# Patient Record
Sex: Male | Born: 1935 | Race: White | Hispanic: No | Smoking: Former smoker
Health system: Southern US, Community
[De-identification: ages and names within clinical notes are randomized; demographics above are authoritative.]

## PROBLEM LIST (undated history)

## (undated) DIAGNOSIS — R0600 Dyspnea, unspecified: Secondary | ICD-10-CM

## (undated) DIAGNOSIS — R413 Other amnesia: Secondary | ICD-10-CM

## (undated) DIAGNOSIS — I493 Ventricular premature depolarization: Secondary | ICD-10-CM

## (undated) DIAGNOSIS — K220 Achalasia of cardia: Secondary | ICD-10-CM

## (undated) DIAGNOSIS — I499 Cardiac arrhythmia, unspecified: Secondary | ICD-10-CM

## (undated) DIAGNOSIS — I519 Heart disease, unspecified: Secondary | ICD-10-CM

## (undated) DIAGNOSIS — R0609 Other forms of dyspnea: Secondary | ICD-10-CM

## (undated) DIAGNOSIS — I2510 Atherosclerotic heart disease of native coronary artery without angina pectoris: Secondary | ICD-10-CM

## (undated) DIAGNOSIS — J449 Chronic obstructive pulmonary disease, unspecified: Secondary | ICD-10-CM

## (undated) DIAGNOSIS — C4490 Unspecified malignant neoplasm of skin, unspecified: Secondary | ICD-10-CM

## (undated) DIAGNOSIS — S060X9A Concussion with loss of consciousness of unspecified duration, initial encounter: Secondary | ICD-10-CM

## (undated) DIAGNOSIS — E785 Hyperlipidemia, unspecified: Secondary | ICD-10-CM

## (undated) DIAGNOSIS — Z95810 Presence of automatic (implantable) cardiac defibrillator: Secondary | ICD-10-CM

## (undated) DIAGNOSIS — S060XAA Concussion with loss of consciousness status unknown, initial encounter: Secondary | ICD-10-CM

## (undated) DIAGNOSIS — E119 Type 2 diabetes mellitus without complications: Secondary | ICD-10-CM

## (undated) DIAGNOSIS — R011 Cardiac murmur, unspecified: Secondary | ICD-10-CM

## (undated) DIAGNOSIS — N19 Unspecified kidney failure: Secondary | ICD-10-CM

## (undated) DIAGNOSIS — J189 Pneumonia, unspecified organism: Secondary | ICD-10-CM

## (undated) DIAGNOSIS — N183 Chronic kidney disease, stage 3 (moderate): Secondary | ICD-10-CM

## (undated) DIAGNOSIS — H9190 Unspecified hearing loss, unspecified ear: Secondary | ICD-10-CM

## (undated) DIAGNOSIS — K219 Gastro-esophageal reflux disease without esophagitis: Secondary | ICD-10-CM

## (undated) DIAGNOSIS — N400 Enlarged prostate without lower urinary tract symptoms: Secondary | ICD-10-CM

## (undated) DIAGNOSIS — K224 Dyskinesia of esophagus: Secondary | ICD-10-CM

## (undated) HISTORY — DX: Unspecified kidney failure: N19

## (undated) HISTORY — DX: Hyperlipidemia, unspecified: E78.5

## (undated) HISTORY — DX: Dyspnea, unspecified: R06.00

## (undated) HISTORY — DX: Ventricular premature depolarization: I49.3

## (undated) HISTORY — PX: FOOT FRACTURE SURGERY: SHX645

## (undated) HISTORY — PX: EYE SURGERY: SHX253

## (undated) HISTORY — DX: Heart disease, unspecified: I51.9

## (undated) HISTORY — DX: Chronic kidney disease, stage 3 (moderate): N18.3

## (undated) HISTORY — DX: Achalasia of cardia: K22.0

## (undated) HISTORY — DX: Dyskinesia of esophagus: K22.4

## (undated) HISTORY — DX: Chronic obstructive pulmonary disease, unspecified: J44.9

## (undated) HISTORY — DX: Atherosclerotic heart disease of native coronary artery without angina pectoris: I25.10

## (undated) HISTORY — PX: CATARACT EXTRACTION W/ INTRAOCULAR LENS  IMPLANT, BILATERAL: SHX1307

## (undated) HISTORY — PX: SKIN CANCER EXCISION: SHX779

## (undated) HISTORY — PX: INSERT / REPLACE / REMOVE PACEMAKER: SUR710

## (undated) HISTORY — DX: Other amnesia: R41.3

## (undated) HISTORY — DX: Other forms of dyspnea: R06.09

## (undated) HISTORY — DX: Gastro-esophageal reflux disease without esophagitis: K21.9

## (undated) HISTORY — DX: Pneumonia, unspecified organism: J18.9

## (undated) HISTORY — DX: Benign prostatic hyperplasia without lower urinary tract symptoms: N40.0

---

## 1992-08-01 DIAGNOSIS — I219 Acute myocardial infarction, unspecified: Secondary | ICD-10-CM

## 1992-08-01 HISTORY — PX: CORONARY ANGIOPLASTY: SHX604

## 1998-10-05 ENCOUNTER — Ambulatory Visit (HOSPITAL_BASED_OUTPATIENT_CLINIC_OR_DEPARTMENT_OTHER): Admission: RE | Admit: 1998-10-05 | Discharge: 1998-10-05 | Payer: Self-pay | Admitting: Specialist

## 2001-08-27 ENCOUNTER — Encounter: Payer: Self-pay | Admitting: Emergency Medicine

## 2001-08-27 ENCOUNTER — Emergency Department (HOSPITAL_COMMUNITY): Admission: AC | Admit: 2001-08-27 | Discharge: 2001-08-27 | Payer: Self-pay | Admitting: Emergency Medicine

## 2005-12-28 ENCOUNTER — Ambulatory Visit (HOSPITAL_COMMUNITY): Admission: RE | Admit: 2005-12-28 | Discharge: 2005-12-28 | Payer: Self-pay | Admitting: Cardiology

## 2005-12-28 ENCOUNTER — Encounter: Payer: Self-pay | Admitting: Vascular Surgery

## 2006-01-11 HISTORY — PX: CARDIAC CATHETERIZATION: SHX172

## 2006-01-12 ENCOUNTER — Inpatient Hospital Stay (HOSPITAL_BASED_OUTPATIENT_CLINIC_OR_DEPARTMENT_OTHER): Admission: RE | Admit: 2006-01-12 | Discharge: 2006-01-12 | Payer: Self-pay | Admitting: Cardiology

## 2009-09-01 HISTORY — PX: CHOLECYSTECTOMY: SHX55

## 2009-09-03 ENCOUNTER — Ambulatory Visit: Payer: Self-pay | Admitting: Family Medicine

## 2009-09-03 ENCOUNTER — Inpatient Hospital Stay (HOSPITAL_COMMUNITY): Admission: EM | Admit: 2009-09-03 | Discharge: 2009-09-10 | Payer: Self-pay | Admitting: Family Medicine

## 2009-09-03 ENCOUNTER — Encounter (HOSPITAL_COMMUNITY): Payer: Self-pay | Admitting: Emergency Medicine

## 2009-09-03 ENCOUNTER — Ambulatory Visit: Payer: Self-pay | Admitting: Pulmonary Disease

## 2009-09-09 ENCOUNTER — Encounter: Payer: Self-pay | Admitting: Family Medicine

## 2010-05-13 ENCOUNTER — Ambulatory Visit: Payer: Self-pay | Admitting: Cardiology

## 2010-05-20 ENCOUNTER — Encounter: Admission: RE | Admit: 2010-05-20 | Discharge: 2010-05-20 | Payer: Self-pay | Admitting: Family Medicine

## 2010-05-24 ENCOUNTER — Telehealth (INDEPENDENT_AMBULATORY_CARE_PROVIDER_SITE_OTHER): Payer: Self-pay | Admitting: *Deleted

## 2010-05-25 ENCOUNTER — Encounter (HOSPITAL_COMMUNITY)
Admission: RE | Admit: 2010-05-25 | Discharge: 2010-07-31 | Payer: Self-pay | Source: Home / Self Care | Attending: Cardiology | Admitting: Cardiology

## 2010-05-25 ENCOUNTER — Ambulatory Visit: Payer: Self-pay

## 2010-05-25 ENCOUNTER — Encounter: Payer: Self-pay | Admitting: Cardiovascular Disease

## 2010-05-25 ENCOUNTER — Ambulatory Visit: Payer: Self-pay | Admitting: Cardiovascular Disease

## 2010-08-20 ENCOUNTER — Encounter
Admission: RE | Admit: 2010-08-20 | Discharge: 2010-08-20 | Payer: Self-pay | Source: Home / Self Care | Attending: Family Medicine | Admitting: Family Medicine

## 2010-08-31 NOTE — Assessment & Plan Note (Signed)
Summary: Cardiology Nuclear Testing  Nuclear Med Background Indications for Stress Test: Evaluation for Ischemia, PTCA Patency   History: Angioplasty, Heart Catheterization, Myocardial Perfusion Study  History Comments:  ~10 MPS:no report  Symptoms: Chest Pain, Chest Pain with Exertion, DOE, Fatigue, Palpitations  Symptoms Comments: Last episode of CP:3 months ago.   Nuclear Pre-Procedure Cardiac Risk Factors: Lipids, NIDDM Caffeine/Decaff Intake: 7:00 am CHOCOLATE CHIP 1/2 COOKIE NPO After: 7:00 AM Lungs: Clear IV 0.9% NS with Angio Cath: 22g     IV Site: R Forearm IV Started by: Irean Hong, RN Chest Size (in) 46     Height (in): 70 Weight (lb): 195 BMI: 28.08  Nuclear Med Study 1 or 2 day study:  1 day     Stress Test Type:  Treadmill/Lexiscan Reading MD:  Charlton Haws, MD     Referring MD:  Peter Swaziland, MD Resting Radionuclide:  Technetium 4m Tetrofosmin     Resting Radionuclide Dose:  10.7 mCi  Stress Radionuclide:  Technetium 73m Tetrofosmin     Stress Radionuclide Dose:  32 mCi   Stress Protocol Exercise Time (min):  5:00 min     Max HR:  111 bpm     Predicted Max HR:  146 bpm  Max Systolic BP: 153 mm Hg     Percent Max HR:  76.03 %     METS: 5.7 Rate Pressure Product:  16109  Lexiscan: 0.4 mg   Stress Test Technologist:  Rea College, CMA-N     Nuclear Technologist:  Domenic Polite, CNMT  Rest Procedure  Myocardial perfusion imaging was performed at rest 45 minutes following the intravenous administration of Technetium 70m Tetrofosmin.  Stress Procedure  The patient initially walked the treadmill for four minutes utilizing the Bruce protocol, but was unable to achieve his target heart rate so he was then given IV Lexiscan 0.4 mg over 15-seconds while continuing to walk at a lower level and then Technetium 54m Tetrofosmin was injected at 30-seconds.  There were no diagnostic ST-T wave changes with Lexiscan.  There were occasional PVC's with couplets noted.   Quantitative spect images were obtained after a 45 minute delay.  QPS Raw Data Images:  Normal; no motion artifact; normal heart/lung ratio. Stress Images:  anterior mi Rest Images:  anterior m Subtraction (SDS):  SDS 2 Transient Ischemic Dilatation:  .92  (Normal <1.22)  Lung/Heart Ratio:  .34  (Normal <0.45)  Quantitative Gated Spect Images QGS EDV:  161 ml QGS ESV:  109 ml QGS EF:  32 % QGS cine images:  Diffuse hypokinesis worese in anterior wall and apex  Findings Low risk nuclear study  Evidence for anterior (septal apical) infarct  Evidence for LV Dysfunction LV Dysfunction    Overall Impression  Exercise Capacity: Fair exercise capacity. BP Response: Normal blood pressure response. Clinical Symptoms: No chest pain ECG Impression: Old anterior MI no ischemia Overall Impression: Large anteroapical inarct with no ischemia.

## 2010-08-31 NOTE — Progress Notes (Signed)
Summary: Nuclear pre procedure  Phone Note Outgoing Call Call back at [facility] Phone [phone]   Call placed by: Rea College, CMA,  May 24, 2010 3:30 PM Call placed to: Patient Summary of Call: Left message with information on Myoview Information Sheet (see scanned document for details).      Nuclear Med Background Indications for Stress Test: Evaluation for Ischemia, PTCA Patency   History: Angioplasty, Heart Catheterization, Myocardial Perfusion Study   Symptoms: Chest Pain, Chest Pain with Exertion, DOE    Nuclear Pre-Procedure Cardiac Risk Factors: IDDM Type 2, Lipids

## 2010-09-08 ENCOUNTER — Other Ambulatory Visit: Payer: Self-pay | Admitting: Dermatology

## 2010-10-20 LAB — CBC
HCT: 34.5 % — ABNORMAL LOW (ref 39.0–52.0)
HCT: 35.3 % — ABNORMAL LOW (ref 39.0–52.0)
HCT: 40.7 % (ref 39.0–52.0)
HCT: 42.8 % (ref 39.0–52.0)
Hemoglobin: 11.8 g/dL — ABNORMAL LOW (ref 13.0–17.0)
Hemoglobin: 12.1 g/dL — ABNORMAL LOW (ref 13.0–17.0)
Hemoglobin: 13.3 g/dL (ref 13.0–17.0)
Hemoglobin: 13.7 g/dL (ref 13.0–17.0)
Hemoglobin: 14.4 g/dL (ref 13.0–17.0)
MCHC: 33.6 g/dL (ref 30.0–36.0)
MCV: 91.2 fL (ref 78.0–100.0)
MCV: 92 fL (ref 78.0–100.0)
MCV: 92.4 fL (ref 78.0–100.0)
MCV: 93.1 fL (ref 78.0–100.0)
MCV: 93.2 fL (ref 78.0–100.0)
Platelets: 157 10*3/uL (ref 150–400)
Platelets: 171 10*3/uL (ref 150–400)
Platelets: 191 10*3/uL (ref 150–400)
Platelets: 209 10*3/uL (ref 150–400)
RBC: 3.78 MIL/uL — ABNORMAL LOW (ref 4.22–5.81)
RBC: 3.83 MIL/uL — ABNORMAL LOW (ref 4.22–5.81)
RBC: 4.29 MIL/uL (ref 4.22–5.81)
RBC: 4.4 MIL/uL (ref 4.22–5.81)
RBC: 4.59 MIL/uL (ref 4.22–5.81)
RDW: 13.1 % (ref 11.5–15.5)
RDW: 13.3 % (ref 11.5–15.5)
RDW: 13.8 % (ref 11.5–15.5)
WBC: 13.9 10*3/uL — ABNORMAL HIGH (ref 4.0–10.5)
WBC: 13.9 10*3/uL — ABNORMAL HIGH (ref 4.0–10.5)
WBC: 16.9 10*3/uL — ABNORMAL HIGH (ref 4.0–10.5)
WBC: 16.9 10*3/uL — ABNORMAL HIGH (ref 4.0–10.5)
WBC: 20.4 10*3/uL — ABNORMAL HIGH (ref 4.0–10.5)
WBC: 24.2 10*3/uL — ABNORMAL HIGH (ref 4.0–10.5)

## 2010-10-20 LAB — BASIC METABOLIC PANEL
BUN: 18 mg/dL (ref 6–23)
BUN: 21 mg/dL (ref 6–23)
CO2: 27 mEq/L (ref 19–32)
CO2: 30 mEq/L (ref 19–32)
Calcium: 7.5 mg/dL — ABNORMAL LOW (ref 8.4–10.5)
Calcium: 7.7 mg/dL — ABNORMAL LOW (ref 8.4–10.5)
Chloride: 103 mEq/L (ref 96–112)
Chloride: 109 mEq/L (ref 96–112)
Creatinine, Ser: 1.25 mg/dL (ref 0.4–1.5)
GFR calc Af Amer: 29 mL/min — ABNORMAL LOW (ref >60)
GFR calc Af Amer: >60 mL/min (ref >60)
GFR calc non Af Amer: 24 mL/min — ABNORMAL LOW (ref >60)
GFR calc non Af Amer: 54 mL/min — ABNORMAL LOW (ref >60)
GFR calc non Af Amer: 56 mL/min — ABNORMAL LOW (ref >60)
Glucose, Bld: 114 mg/dL — ABNORMAL HIGH (ref 70–99)
Glucose, Bld: 121 mg/dL — ABNORMAL HIGH (ref 70–99)
Potassium: 3.5 mEq/L (ref 3.5–5.1)
Potassium: 3.6 mEq/L (ref 3.5–5.1)
Potassium: 3.8 mEq/L (ref 3.5–5.1)
Sodium: 134 mEq/L — ABNORMAL LOW (ref 135–145)
Sodium: 138 mEq/L (ref 135–145)
Sodium: 139 mEq/L (ref 135–145)
Sodium: 142 mEq/L (ref 135–145)

## 2010-10-20 LAB — LIPID PANEL
Cholesterol: 144 mg/dL (ref 0–200)
HDL: 43 mg/dL (ref >39)
LDL Cholesterol: 86 mg/dL (ref 0–99)
Total CHOL/HDL Ratio: 3.3 RATIO
Triglycerides: 74 mg/dL (ref <150)
VLDL: 15 mg/dL (ref 0–40)

## 2010-10-20 LAB — CULTURE, BLOOD (ROUTINE X 2): Culture: NO GROWTH

## 2010-10-20 LAB — HEPATIC FUNCTION PANEL
ALT: 31 U/L (ref 0–53)
ALT: 53 U/L (ref 0–53)
AST: 22 U/L (ref 0–37)
AST: 33 U/L (ref 0–37)
Alkaline Phosphatase: 54 U/L (ref 39–117)
Bilirubin, Direct: 0.3 mg/dL (ref 0.0–0.3)
Bilirubin, Direct: 0.5 mg/dL — ABNORMAL HIGH (ref 0.0–0.3)
Indirect Bilirubin: 0.2 mg/dL — ABNORMAL LOW (ref 0.3–0.9)
Total Bilirubin: 0.5 mg/dL (ref 0.3–1.2)

## 2010-10-20 LAB — DIFFERENTIAL
Basophils Absolute: 0 10*3/uL (ref 0.0–0.1)
Eosinophils Absolute: 0 10*3/uL (ref 0.0–0.7)
Lymphocytes Relative: 5 % — ABNORMAL LOW (ref 12–46)
Monocytes Absolute: 1.2 10*3/uL — ABNORMAL HIGH (ref 0.1–1.0)
Neutrophils Relative %: 90 % — ABNORMAL HIGH (ref 43–77)

## 2010-10-20 LAB — COMPREHENSIVE METABOLIC PANEL
ALT: 105 U/L — ABNORMAL HIGH (ref 0–53)
ALT: 34 U/L (ref 0–53)
AST: 27 U/L (ref 0–37)
AST: 59 U/L — ABNORMAL HIGH (ref 0–37)
Albumin: 2 g/dL — ABNORMAL LOW (ref 3.5–5.2)
Albumin: 3.6 g/dL (ref 3.5–5.2)
Alkaline Phosphatase: 36 U/L — ABNORMAL LOW (ref 39–117)
BUN: 28 mg/dL — ABNORMAL HIGH (ref 6–23)
BUN: 29 mg/dL — ABNORMAL HIGH (ref 6–23)
CO2: 26 mEq/L (ref 19–32)
CO2: 26 mEq/L (ref 19–32)
CO2: 30 mEq/L (ref 19–32)
Calcium: 8.8 mg/dL (ref 8.4–10.5)
Chloride: 106 mEq/L (ref 96–112)
Chloride: 109 mEq/L (ref 96–112)
Chloride: 112 mEq/L (ref 96–112)
Creatinine, Ser: 1.25 mg/dL (ref 0.4–1.5)
Creatinine, Ser: 1.71 mg/dL — ABNORMAL HIGH (ref 0.4–1.5)
Creatinine, Ser: 1.79 mg/dL — ABNORMAL HIGH (ref 0.4–1.5)
GFR calc Af Amer: 45 mL/min — ABNORMAL LOW (ref >60)
GFR calc Af Amer: >60 mL/min (ref >60)
GFR calc non Af Amer: 37 mL/min — ABNORMAL LOW (ref >60)
GFR calc non Af Amer: 39 mL/min — ABNORMAL LOW (ref >60)
GFR calc non Af Amer: 56 mL/min — ABNORMAL LOW (ref >60)
Glucose, Bld: 117 mg/dL — ABNORMAL HIGH (ref 70–99)
Glucose, Bld: 145 mg/dL — ABNORMAL HIGH (ref 70–99)
Potassium: 4.9 mEq/L (ref 3.5–5.1)
Sodium: 141 mEq/L (ref 135–145)
Sodium: 142 mEq/L (ref 135–145)
Total Bilirubin: 0.5 mg/dL (ref 0.3–1.2)
Total Bilirubin: 0.7 mg/dL (ref 0.3–1.2)
Total Bilirubin: 0.8 mg/dL (ref 0.3–1.2)
Total Protein: 6.5 g/dL (ref 6.0–8.3)

## 2010-10-20 LAB — GLUCOSE, CAPILLARY
Glucose-Capillary: 107 mg/dL — ABNORMAL HIGH (ref 70–99)
Glucose-Capillary: 114 mg/dL — ABNORMAL HIGH (ref 70–99)
Glucose-Capillary: 116 mg/dL — ABNORMAL HIGH (ref 70–99)
Glucose-Capillary: 116 mg/dL — ABNORMAL HIGH (ref 70–99)
Glucose-Capillary: 119 mg/dL — ABNORMAL HIGH (ref 70–99)
Glucose-Capillary: 123 mg/dL — ABNORMAL HIGH (ref 70–99)
Glucose-Capillary: 124 mg/dL — ABNORMAL HIGH (ref 70–99)
Glucose-Capillary: 125 mg/dL — ABNORMAL HIGH (ref 70–99)
Glucose-Capillary: 128 mg/dL — ABNORMAL HIGH (ref 70–99)
Glucose-Capillary: 130 mg/dL — ABNORMAL HIGH (ref 70–99)
Glucose-Capillary: 131 mg/dL — ABNORMAL HIGH (ref 70–99)
Glucose-Capillary: 147 mg/dL — ABNORMAL HIGH (ref 70–99)
Glucose-Capillary: 149 mg/dL — ABNORMAL HIGH (ref 70–99)
Glucose-Capillary: 152 mg/dL — ABNORMAL HIGH (ref 70–99)
Glucose-Capillary: 157 mg/dL — ABNORMAL HIGH (ref 70–99)
Glucose-Capillary: 173 mg/dL — ABNORMAL HIGH (ref 70–99)
Glucose-Capillary: 178 mg/dL — ABNORMAL HIGH (ref 70–99)
Glucose-Capillary: 88 mg/dL (ref 70–99)
Glucose-Capillary: 90 mg/dL (ref 70–99)
Glucose-Capillary: 94 mg/dL (ref 70–99)

## 2010-10-20 LAB — AMYLASE: Amylase: 680 U/L — ABNORMAL HIGH (ref 0–105)

## 2010-10-20 LAB — SODIUM, URINE, RANDOM: Sodium, Ur: 35 mEq/L

## 2010-10-20 LAB — CARDIAC PANEL(CRET KIN+CKTOT+MB+TROPI)
Relative Index: INVALID (ref 0.0–2.5)
Troponin I: 0.09 ng/mL — ABNORMAL HIGH (ref 0.00–0.06)

## 2010-10-20 LAB — LACTATE DEHYDROGENASE: LDH: 185 U/L (ref 94–250)

## 2010-10-20 LAB — CREATININE, URINE, RANDOM: Creatinine, Urine: 121.7 mg/dL

## 2010-10-20 LAB — LIPASE, BLOOD: Lipase: 685 U/L — ABNORMAL HIGH (ref 11–59)

## 2010-10-22 LAB — COMPREHENSIVE METABOLIC PANEL
ALT: 120 U/L — ABNORMAL HIGH (ref 0–53)
Alkaline Phosphatase: 32 U/L — ABNORMAL LOW (ref 39–117)
BUN: 26 mg/dL — ABNORMAL HIGH (ref 6–23)
CO2: 29 mEq/L (ref 19–32)
Calcium: 9.2 mg/dL (ref 8.4–10.5)
GFR calc non Af Amer: 45 mL/min — ABNORMAL LOW (ref >60)
Glucose, Bld: 180 mg/dL — ABNORMAL HIGH (ref 70–99)
Sodium: 140 mEq/L (ref 135–145)

## 2010-10-22 LAB — CBC
HCT: 42.1 % (ref 39.0–52.0)
Hemoglobin: 14.2 g/dL (ref 13.0–17.0)
MCHC: 33.7 g/dL (ref 30.0–36.0)
RBC: 4.6 MIL/uL (ref 4.22–5.81)

## 2010-10-22 LAB — DIFFERENTIAL
Basophils Relative: 0 % (ref 0–1)
Eosinophils Absolute: 0 10*3/uL (ref 0.0–0.7)
Lymphs Abs: 0.7 10*3/uL (ref 0.7–4.0)
Neutrophils Relative %: 92 % — ABNORMAL HIGH (ref 43–77)

## 2010-10-22 LAB — LIPASE, BLOOD: Lipase: 1126 U/L — ABNORMAL HIGH (ref 11–59)

## 2010-11-10 ENCOUNTER — Other Ambulatory Visit: Payer: Self-pay | Admitting: *Deleted

## 2010-11-10 DIAGNOSIS — I2510 Atherosclerotic heart disease of native coronary artery without angina pectoris: Secondary | ICD-10-CM

## 2010-11-10 MED ORDER — POTASSIUM CHLORIDE CRYS ER 20 MEQ PO TBCR: 20.0000 meq | EXTENDED_RELEASE_TABLET | Freq: Every day | ORAL | Status: DC

## 2010-11-10 NOTE — Telephone Encounter (Signed)
escribe medication per fax request  

## 2010-11-17 ENCOUNTER — Other Ambulatory Visit: Payer: Self-pay | Admitting: *Deleted

## 2010-11-17 DIAGNOSIS — E7800 Pure hypercholesterolemia, unspecified: Secondary | ICD-10-CM

## 2010-11-17 MED ORDER — FENOFIBRATE 145 MG PO TABS: 145.0000 mg | ORAL_TABLET | Freq: Every day | ORAL | Status: DC

## 2010-11-17 NOTE — Telephone Encounter (Signed)
escribe medication per fax request  

## 2010-11-29 ENCOUNTER — Other Ambulatory Visit: Payer: Self-pay | Admitting: *Deleted

## 2010-11-29 DIAGNOSIS — E7800 Pure hypercholesterolemia, unspecified: Secondary | ICD-10-CM

## 2010-11-29 MED ORDER — PRAVASTATIN SODIUM 40 MG PO TABS: 40.0000 mg | ORAL_TABLET | Freq: Every evening | ORAL | Status: DC

## 2010-11-29 NOTE — Telephone Encounter (Signed)
escribe medication per fax request  

## 2010-12-17 NOTE — H&P (Signed)
Antonio Ray, Antonio Ray NO.:  [id number]   MEDICAL RECORD NO.:  [id number]           PATIENT TYPE:   LOCATION:                                 FACILITY:   PHYSICIAN:  Antonio Ray, M.D.  DATE OF BIRTH:  15-Aug-1933   DATE OF ADMISSION:  01/12/2006  DATE OF DISCHARGE:                                HISTORY & PHYSICAL   DATE OF ADMISSION:  January 12, 2006   HISTORY OF PRESENT ILLNESS:  Antonio Ray is a 77 year old white male who has a  known history of coronary disease.  He suffered an anterior myocardial  infarction in 1994.  He underwent cardiac catheterization in [place],  [place], which showed an occluded proximal LAD.  He had a 60-70% stenosis  in an intermediate branch and he had a dominant left circumflex coronary  that appeared normal.  He was treated with conservative medical therapy.  He  has done very well since that time.  More recently, he experienced symptoms  of near syncope.  He had two episodes that occurred where he felt that the  room was spinning around him and he felt like everything was going black.  He was able to sit down and he states he seemed to go to sleep for period of  time.  During these episodes things appeared to be double.  To further  evaluate his symptoms he did undergo carotid Doppler studies.  These  demonstrated no significant evidence of obstruction.  He did have an  echocardiogram which showed mild concentric LVH.  He had severe left  ventricular systolic dysfunction with ejection fraction of 25-30% with  evidence of anterior septal and apical akinesia.  Mild mitral insufficiency  was noted.  Compared to his prior echocardiogram in 2002 and prior  Cardiolite study in 2002 this did represent a significant decrease in his LV  function.  The patient has noticed symptoms of dyspnea going up steps.  He  has occasional vague discomfort in his chest that he describes as a coldness  or damp feeling.  He has noticed that he is more  fatigued.  Because of these  symptoms and the decline in his LV function, cardiac catheterization was  recommended.   PAST MEDICAL HISTORY:  1.  Remote anterior myocardial infarction.  2.  History of ischemic cardiomyopathy.  3.  History of chronic PVCs.  4.  Hyperlipidemia.   PRIOR SURGERY:  Includes surgery for broken foot in 1982.   He is allergic to CODEINE.   CURRENT MEDICATIONS:  Include:  1.  Vasotec 5 mg b.i.d.  2.  Nitroglycerin patch 0.2 mg per hour topically daily.  3.  Ecotrin daily.  4.  Lasix 20 mg per day.  5.  Potassium 20 mEq per day.  6.  TriCor 145 mg per day.  7.  Simvastatin 20 mg per day.   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  He quit smoking at the time of  his heart attack in 1994.  He denies alcohol use.  He is married with two  healthy children.   FAMILY HISTORY:  Father died at age 86 with a stroke.  He does not know  anything about his mother's history.  He has two siblings in good health.   REVIEW OF SYSTEMS:  Is otherwise unremarkable.   PHYSICAL EXAMINATION:  GENERAL:  The patient is an overweight white male in  no apparent distress.  VITAL SIGNS:  Weight is 214, blood pressure is 112/60, pulse is 60 and  regular.  NECK:  He has no jugular venous distension or bruits.  There is no  adenopathy or thyromegaly.  LUNGS:  Clear to auscultation and percussion.  CARDIAC:  Reveals regular rate and rhythm without gallop, murmur or click.  ABDOMEN:  Soft and nontender.  He has no masses or hepatosplenomegaly.  Femoral and pedal pulses 3+ and symmetric.  NEUROLOGIC:  Nonfocal.   LABORATORY DATA:  ECG shows normal sinus rhythm with evidence of an old  anteroseptal myocardial infarction.  Chest x-ray shows no active disease.  BNP level is 296.  Glucose 102, BUN 19, creatinine 1.2.  Electrolytes were  normal.  CBC is normal.   IMPRESSION:  1.  Symptoms of  near-syncope and dizziness.  2.  Dyspnea on exertion, atypical chest pain.  3.  Ischemic cardiomyopathy with worsening ejection fraction.  4.  Remote anterior myocardial infarction.  5.  Hyperlipidemia.   PLAN:  Will proceed with diagnostic cardiac catheterization with further  therapy pending these results.           ______________________________  Antonio Ray, M.D.     PMJ/MEDQ  D:  01/06/2006  T:  01/06/2006  Job:  [id number]

## 2010-12-17 NOTE — Cardiovascular Report (Signed)
Antonio Ray, [name]:  [id number]   MEDICAL RECORD NO.:  [id number]          PATIENT TYPE:  OIB   LOCATION:  1961                         FACILITY:  [name]   PHYSICIAN:  [name], M.D.  DATE OF BIRTH:  1933/08/06   DATE OF PROCEDURE:  01/11/2006  DATE OF DISCHARGE:                              CARDIAC CATHETERIZATION   INDICATIONS FOR PROCEDURE:  A 77 year old white male who is status post  remote anterior myocardial infarction in 1994 treated with angioplasty in  the LAD.  He presents with symptoms of dyspnea on exertion.  His recent  echocardiogram indicated decline in his left ventricular systolic function,  compared with old studies.   PROCEDURES:  Left heart catheterization, coronary and left ventricular  angiography.   ACCESS:  Via the right femoral artery using standard Seldinger technique.   EQUIPMENT:  A 4-French 4 cm left Judkins catheter, 4-French no-toe right  catheter, 4-French pigtail catheter and 4-French arterial sheath.   MEDICATIONS:  Local anesthesia with 1% Xylocaine.   CONTRAST:  90 mL of Omnipaque.   HEMODYNAMIC DATA:  Aortic pressure was 122/50 with a mean of 79 mmHg.  Left  ventricle pressure is 122 with EDP of 18 mmHg.   ANGIOGRAPHIC DATA:  The left coronary arises and distributes in a dominant  fashion.  The left main coronary is without significant disease and very  short.   The left anterior descending artery extends out to the apex.  It is a  relatively small caliber vessel throughout its course.  In the proximal  vessel there is a 50% stenosis with calcification.  There is a small first  diagonal branch which is without significant disease.   There is a moderately large intermediate branch which has 20% narrowing  proximally.   The left circumflex coronary is a dominant vessel.  It does share supply of  the inferior septum with the right coronary.  The left circumflex coronary  is without significant disease.   The first marginal vessel has 20%  narrowing.   The right coronary artery is a moderate nondominant vessel.  It has no  significant disease.   The left ventricular angiography was performed in RAO view.  This  demonstrates akinesia of the distal anterior wall, distal inferior wall and  dyskinesia of the apex.  The basal segments contract well and overall  ejection fraction is approximately 35%.   FINAL INTERPRETATION:  1.  Atherosclerotic coronary disease with borderline stenosis in the      proximal LAD at site of previous angioplasty.  2.  Anterior apical left ventricular wall motion abnormality consistent with      old infarction with moderate to severe left ventricular dysfunction.   PLAN:  Recommend continued medical therapy.           ______________________________  [name], M.D.     PMJ/MEDQ  D:  01/12/2006  T:  01/12/2006  Job:  [id number]

## 2011-01-03 ENCOUNTER — Telehealth: Payer: Self-pay | Admitting: Cardiology

## 2011-01-03 NOTE — Telephone Encounter (Signed)
Patient wants to know if RN thinks he needs to come in for appt

## 2011-01-03 NOTE — Telephone Encounter (Signed)
Called to get an appointment. Last seen in Oct 2011. States he is having some digestive problems. Advised to call PCP and may need to see GI. App made for end of June.

## 2011-01-17 ENCOUNTER — Encounter: Payer: Self-pay | Admitting: Cardiology

## 2011-01-17 ENCOUNTER — Encounter: Payer: Self-pay | Admitting: Cardiovascular Disease

## 2011-01-21 ENCOUNTER — Encounter: Payer: Self-pay | Admitting: Cardiology

## 2011-01-21 ENCOUNTER — Ambulatory Visit (INDEPENDENT_AMBULATORY_CARE_PROVIDER_SITE_OTHER): Payer: Federal, State, Local not specified - PPO | Admitting: Cardiology

## 2011-01-21 VITALS — BP 112/58 | HR 62 | Ht 70.0 in | Wt 205.8 lb

## 2011-01-21 DIAGNOSIS — I2510 Atherosclerotic heart disease of native coronary artery without angina pectoris: Secondary | ICD-10-CM | POA: Insufficient documentation

## 2011-01-21 DIAGNOSIS — I495 Sick sinus syndrome: Secondary | ICD-10-CM | POA: Insufficient documentation

## 2011-01-21 DIAGNOSIS — I4949 Other premature depolarization: Secondary | ICD-10-CM

## 2011-01-21 DIAGNOSIS — E119 Type 2 diabetes mellitus without complications: Secondary | ICD-10-CM

## 2011-01-21 DIAGNOSIS — E785 Hyperlipidemia, unspecified: Secondary | ICD-10-CM | POA: Insufficient documentation

## 2011-01-21 DIAGNOSIS — I252 Old myocardial infarction: Secondary | ICD-10-CM

## 2011-01-21 DIAGNOSIS — I519 Heart disease, unspecified: Secondary | ICD-10-CM

## 2011-01-21 DIAGNOSIS — I493 Ventricular premature depolarization: Secondary | ICD-10-CM

## 2011-01-21 DIAGNOSIS — I5043 Acute on chronic combined systolic (congestive) and diastolic (congestive) heart failure: Secondary | ICD-10-CM | POA: Insufficient documentation

## 2011-01-21 NOTE — Assessment & Plan Note (Signed)
He is intolerant of beta blockers due tobradycardia. He will continue his current medications.

## 2011-01-21 NOTE — Patient Instructions (Signed)
Continue your current medications.  We will schedule you for evaluation with one of our electrophysiologists to consider implantation of a defibrillator. Call Synetta Fail with dates that would work for you. [phone].  I will see you again in 6 months.

## 2011-01-21 NOTE — Progress Notes (Signed)
Antonio Ray Date of Birth: 11/27/1933   History of Present Illness: Antonio Ray is seen today for followup. He has been doing fairly well. He notes one episode 2 weeks ago wherein he awoke with some substernal chest pain and felt sweaty. He got up and drank water and his symptoms resolved. He's had no further symptoms since then. This is the only episode of chest discomfort he has had. He denies any shortness of breath or palpitations. He's had no dizziness or syncope.  Current Outpatient Prescriptions on File Prior to Visit  Medication Sig Dispense Refill  . aspirin 81 MG EC tablet Take 81 mg by mouth daily.        . Cholecalciferol (VITAMIN D PO) Take by mouth daily.        . enalapril (VASOTEC) 10 MG tablet Take 5 mg by mouth 2 (two) times daily.        . fenofibrate (TRICOR) 145 MG tablet Take 1 tablet (145 mg total) by mouth daily.  30 tablet  11  . furosemide (LASIX) 20 MG tablet Take 20 mg by mouth daily.        Marland Kitchen glimepiride (AMARYL) 2 MG tablet Take 1 mg by mouth 2 (two) times daily.        Marland Kitchen omeprazole (PRILOSEC OTC) 20 MG tablet Take 20 mg by mouth daily.        . potassium chloride SA (K-DUR,KLOR-CON) 20 MEQ tablet Take 1 tablet (20 mEq total) by mouth daily.  30 tablet  5  . pravastatin (PRAVACHOL) 40 MG tablet Take 1 tablet (40 mg total) by mouth every evening.  30 tablet  5  . DISCONTD: glipiZIDE (GLUCOTROL) 2.5 MG 24 hr tablet Take 5 mg by mouth daily.        Marland Kitchen DISCONTD: nitroGLYCERIN (NITROSTAT) 0.4 MG SL tablet Place 0.4 mg under the tongue every 5 (five) minutes as needed.          Allergies  Allergen Reactions  . Codeine     REACTION: Reaction not known    Past Medical History  Diagnosis Date  . GERD (gastroesophageal reflux disease)   . Coronary artery disease   . MI, old     ANTERIOR  . LV dysfunction     EF 30-35%, CLASS II SYMPTOMS  . Hyperlipidemia   . PVC's (premature ventricular contractions)   . Diabetes mellitus   . DOE (dyspnea on exertion)     . Pneumonia     Past Surgical History  Procedure Date  . Cardiac catheterization 01/11/2006    DEMONSTRATES AKINESIA OF THE DISTAL ANTERIOR WALL, DISTAL INFERIOR WALL AND AKINESIA OF THE APEX. THE BASAL SEGMENTS CONTRACT WELL AND OVERALL EF 35%  . Coronary angioplasty     TO THE LAD  . Foot surgery   . Cholecystectomy     History  Smoking status  . Former Smoker  . Quit date: 08/01/1992  Smokeless tobacco  . Not on file    History  Alcohol Use No    Family History  Problem Relation Age of Onset  . Stroke Father     Review of Systems: The review of systems is positive for incomplete emptying of his bladder.  He still notes some irregularity in his bowels ever since his gallbladder surgery.All other systems were reviewed and are negative.  Physical Exam: BP 112/58  Pulse 62  Ht 5\' 10"  (1.778 m)  Wt 205 lb 12.8 oz (93.35 kg)  BMI 29.53 kg/m2 He  is an obese white male in no acute distress. Normocephalic, atraumatic. Pupils are equal round and reactive. Oropharynx is clear. Neck is supple without JVD, adenopathy, thyromegaly, or bruits. Lungs are clear. Cardiac exam reveals a regular rate and rhythm without gallop, murmur, or click. Abdomen is obese, soft, nontender. Bowel sounds are positive. He has no edema. Pedal pulses are good. He is alert and oriented x3. Cranial nerves II through XII are intact. LABORATORY DATA: ECG demonstrates normal sinus rhythm with an old anteroseptal myocardial infarction.  Assessment / Plan:

## 2011-01-21 NOTE — Assessment & Plan Note (Signed)
He has a chronic ischemic cardiomyopathy. He appears to be well compensated. He meets criteria for prophylactic placement of defibrillator. We have not pursued this previously because he has had no significant arrhythmias since his myocardial infarction 18 years ago. However I told him he should consider this and we will schedule him for evaluation with electrophysiology.

## 2011-01-21 NOTE — Assessment & Plan Note (Signed)
Lipid panel in January of this year showed excellent control with a total cholesterol of 140, triglycerides 138, HDL 40, and LDL of 72.

## 2011-01-31 ENCOUNTER — Encounter: Payer: Self-pay | Admitting: Cardiology

## 2011-01-31 ENCOUNTER — Other Ambulatory Visit: Payer: Self-pay | Admitting: Cardiology

## 2011-01-31 MED ORDER — NITROGLYCERIN 0.2 MG/HR TD PT24: 1.0000 | MEDICATED_PATCH | Freq: Every day | TRANSDERMAL | Status: DC

## 2011-01-31 NOTE — Telephone Encounter (Signed)
Med refill

## 2011-02-07 ENCOUNTER — Telehealth: Payer: Self-pay | Admitting: Cardiology

## 2011-02-07 NOTE — Telephone Encounter (Signed)
Pt wanted to talk to you about appt that you were supposed to schedule for him

## 2011-02-08 NOTE — Telephone Encounter (Signed)
Called back to schedule an app w/EP physician per Dr. Swaziland to be eval for ICD placement. App made w/ Dr. Johney Frame 8/1 at 10:15

## 2011-02-22 ENCOUNTER — Other Ambulatory Visit: Payer: Self-pay | Admitting: *Deleted

## 2011-02-22 MED ORDER — FUROSEMIDE 20 MG PO TABS: 20.0000 mg | ORAL_TABLET | Freq: Every day | ORAL | Status: DC

## 2011-02-22 NOTE — Telephone Encounter (Signed)
escribe medication per fax request  

## 2011-03-02 ENCOUNTER — Ambulatory Visit (INDEPENDENT_AMBULATORY_CARE_PROVIDER_SITE_OTHER): Payer: Federal, State, Local not specified - PPO | Admitting: Internal Medicine

## 2011-03-02 ENCOUNTER — Encounter: Payer: Self-pay | Admitting: Internal Medicine

## 2011-03-02 DIAGNOSIS — I2510 Atherosclerotic heart disease of native coronary artery without angina pectoris: Secondary | ICD-10-CM

## 2011-03-02 DIAGNOSIS — I5022 Chronic systolic (congestive) heart failure: Secondary | ICD-10-CM

## 2011-03-02 DIAGNOSIS — I509 Heart failure, unspecified: Secondary | ICD-10-CM

## 2011-03-02 DIAGNOSIS — I495 Sick sinus syndrome: Secondary | ICD-10-CM

## 2011-03-02 NOTE — Progress Notes (Signed)
Antonio Ray is a pleasant 77 y.o. WM patient with a h/o CAD s/p PTCA of the LAD with MI 1994, chronic systolic dysfunction (EF 32%) and NYHA Class II/III CHF who presents today for risk stratefication of sudden death.  He reports being reasonably active.  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  He walks 1-1.5 miles daily.  He reports SOB with moderate activity.  He has stable canadian class II angina.  He underwent cath 2007 which revealed no lesions amenable to PCI.  His most recent stress test 10/11 revealed EF 32% and was felt to be low risk.  He has been treated with a good medical regimen but has been unable to take beta blockers due to symptomatic bradycardia.  Today, he denies symptoms of palpitations,orthopnea, PND, lower extremity edema, dizziness, presyncope, syncope, or neurologic sequela. The patient is tolerating medications without difficulties and is otherwise without complaint today.   Past Medical History  Diagnosis Date  . GERD (gastroesophageal reflux disease)   . Coronary artery disease     most recent cath 2007, no intervention required  . MI, old 2007    ANTERIOR  . Chronic systolic dysfunction of left ventricle     EF 30-35%, CLASS II SYMPTOMS  . Hyperlipidemia   . PVC's (premature ventricular contractions)   . Diabetes mellitus   . DOE (dyspnea on exertion)    Past Surgical History  Procedure Date  . Cardiac catheterization 01/11/2006    DEMONSTRATES AKINESIA OF THE DISTAL ANTERIOR WALL, DISTAL INFERIOR WALL AND AKINESIA OF THE APEX. THE BASAL SEGMENTS CONTRACT WELL AND OVERALL EF 35%  . Coronary angioplasty 1994    TO THE LAD  . Foot surgery     remote  . Cholecystectomy 2/11    Current Outpatient Prescriptions  Medication Sig Dispense Refill  . aspirin 81 MG EC tablet Take 81 mg by mouth daily.        . Cholecalciferol (VITAMIN D PO) Take by mouth daily.        . enalapril (VASOTEC) 10 MG tablet Take 5 mg by mouth 2 (two) times daily.        .  fenofibrate (TRICOR) 145 MG tablet Take 1 tablet (145 mg total) by mouth daily.  30 tablet  11  . furosemide (LASIX) 20 MG tablet Take 1 tablet (20 mg total) by mouth daily.  30 tablet  5  . glimepiride (AMARYL) 2 MG tablet Take 1 mg by mouth 2 (two) times daily.        . nitroGLYCERIN (NITRODUR - DOSED IN MG/24 HR) 0.2 mg/hr Place 1 patch (0.2 mg total) onto the skin daily.  30 patch  5  . omeprazole (PRILOSEC OTC) 20 MG tablet Take 20 mg by mouth daily.        . potassium chloride SA (K-DUR,KLOR-CON) 20 MEQ tablet Take 1 tablet (20 mEq total) by mouth daily.  30 tablet  5  . pravastatin (PRAVACHOL) 40 MG tablet Take 1 tablet (40 mg total) by mouth every evening.  30 tablet  5    Allergies  Allergen Reactions  . Codeine     REACTION: Reaction not known    History   Social History  . Marital Status: Widowed    Spouse Name: N/A    Number of Children: 1  . Years of Education: N/A   Occupational History  . electronics technician [employer]   Social History Main Topics  . Smoking status: Former Smoker  Quit date: 08/01/1992  . Smokeless tobacco: Not on file  . Alcohol Use: No  . Drug Use: No  . Sexually Active:    Other Topics Concern  . Not on file   Social History Narrative   Pt lives in [place] alone.  Widowed.  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    Family History  Problem Relation Age of Onset  . Stroke Father     ROS- All systems are reviewed and negative except as per the HPI above  Physical Exam: Filed Vitals:   03/02/11 1042  BP: 119/58  Pulse: 64  Resp: 14  Height: 5\' 10"  (1.778 m)  Weight: 206 lb (93.441 kg)    GEN- The patient is well appearing, alert and oriented x 3 today.   Head- normocephalic, atraumatic Eyes-  Sclera clear, conjunctiva pink Ears- hearing intact Oropharynx- clear Neck- supple, no JVP Lymph- no cervical lymphadenopathy Lungs- Clear to ausculation bilaterally, normal work of  breathing Heart- Regular rate and rhythm, no murmurs, rubs or gallops, PMI not laterally displaced GI- soft, NT, ND, + BS Extremities- no clubbing, cyanosis, or edema MS- no significant deformity or atrophy Skin- no rash or lesion Psych- euthymic mood, full affect Neuro- strength and sensation are intact  EKG 01/21/11 reveals sinus rhythm 60 bpm, anteroseptal infarction, QRS 102  Assessment and Plan:

## 2011-03-02 NOTE — Assessment & Plan Note (Signed)
The patient has an ischemic CM (EF 32%), NYHA Class II/III CHF, and CAD. At this time, he meets MADIT II/ SCD-HeFT criteria for ICD implantation for primary prevention of sudden death.  Risks, benefits, alternatives to ICD implantation were discussed in detail with the patient today. The patient  understands that the risks and wishes to further discuss this option with his family.  He is provided with an educational handout about ICDs today.  He will contact my office if he wishes to proceed with ICD implantation in the future.  We briefly discussed the Analyze ST study today.  If he decides to have an ICD, he would be a good candidate for ICD.   He is on a good medical regimen but is not on a beta blocker due to symptomatic bradycardia.  If he proceeds with ICD, I would plan to place an atrial lead at that time.

## 2011-03-02 NOTE — Assessment & Plan Note (Signed)
He is on a good medical regimen but is not on a beta blocker due to bradycardia.  If he proceeds with ICD, I would plan to place an atrial lead at that time.

## 2011-03-02 NOTE — Assessment & Plan Note (Signed)
No ischemic symptoms myoview from 2011 reviewed No changes today

## 2011-03-04 ENCOUNTER — Telehealth: Payer: Self-pay | Admitting: Internal Medicine

## 2011-03-04 NOTE — Telephone Encounter (Signed)
Pt called regarding a procedure that he recommended he have.  Please call him back

## 2011-03-04 NOTE — Telephone Encounter (Signed)
Pt calling wanting to let kelly and dr allred know he has decided to have procedure--advised i would let kelly know that pt is ready to schedule procedure--pt agrees--nt

## 2011-03-08 NOTE — Telephone Encounter (Signed)
lmom for pt that the first time is 03/29/11 and to call me back and let me know if this is convenient for him  Will need labs on 03/22/11

## 2011-03-08 NOTE — Telephone Encounter (Signed)
Pt called and stated he has not heard anything from Brogan.  Please call him today and advise. [phone].

## 2011-03-10 NOTE — Telephone Encounter (Signed)
Calling to speak w/ Antonio Ray to get a defib implant, pt said he is scheduled for Aug 28th and wanted to see if he can move it to Aug 29th. Please return pt call to advise/discuss.

## 2011-03-10 NOTE — Telephone Encounter (Signed)
Left a message that Tresa Endo will be in the office next week.

## 2011-03-11 NOTE — Telephone Encounter (Signed)
I left a message for the patient to call today.

## 2011-03-11 NOTE — Telephone Encounter (Signed)
No call back from the patient today. I will forward to [facility] for Monday.

## 2011-03-14 ENCOUNTER — Encounter: Payer: Self-pay | Admitting: Internal Medicine

## 2011-03-14 NOTE — Telephone Encounter (Signed)
Pt calling procedure date of 8-28, requesting 8-29 if possible

## 2011-03-14 NOTE — Telephone Encounter (Signed)
Called and lmom for pt that we can move procedure to [facility] 03/30/11 and labs on 8/22  Please call me back to confirm

## 2011-03-14 NOTE — Telephone Encounter (Signed)
This encounter was created in error - please disregard.

## 2011-03-15 ENCOUNTER — Encounter: Payer: Self-pay | Admitting: *Deleted

## 2011-03-15 NOTE — Telephone Encounter (Signed)
Pt aware of time for procedure  Will be here on 03/23/11 at 10 for labs

## 2011-03-21 ENCOUNTER — Telehealth: Payer: Self-pay | Admitting: *Deleted

## 2011-03-21 NOTE — Telephone Encounter (Signed)
I spoke to patient about Analyze ST Study. I will meet with patient on Wed,03/23/11 and discuss the study in more details.

## 2011-03-23 ENCOUNTER — Ambulatory Visit (INDEPENDENT_AMBULATORY_CARE_PROVIDER_SITE_OTHER): Payer: Federal, State, Local not specified - PPO | Admitting: *Deleted

## 2011-03-23 DIAGNOSIS — I5022 Chronic systolic (congestive) heart failure: Secondary | ICD-10-CM

## 2011-03-23 DIAGNOSIS — I495 Sick sinus syndrome: Secondary | ICD-10-CM

## 2011-03-23 LAB — CBC WITH DIFFERENTIAL/PLATELET
Basophils Absolute: 0 10*3/uL (ref 0.0–0.1)
Basophils Relative: 0.5 % (ref 0.0–3.0)
Eosinophils Absolute: 0.4 10*3/uL (ref 0.0–0.7)
Lymphs Abs: 1.9 10*3/uL (ref 0.7–4.0)
Neutro Abs: 3.4 10*3/uL (ref 1.4–7.7)
Neutrophils Relative %: 55.5 % (ref 43.0–77.0)
Platelets: 206 10*3/uL (ref 150.0–400.0)
WBC: 6.2 10*3/uL (ref 4.5–10.5)

## 2011-03-23 LAB — PROTIME-INR: Prothrombin Time: 11.7 s (ref 10.2–12.4)

## 2011-03-23 LAB — BASIC METABOLIC PANEL
BUN: 29 mg/dL — ABNORMAL HIGH (ref 6–23)
Creatinine, Ser: 1.5 mg/dL (ref 0.4–1.5)
GFR: 49.17 mL/min — ABNORMAL LOW (ref >60.00)

## 2011-03-30 ENCOUNTER — Ambulatory Visit (HOSPITAL_COMMUNITY): Payer: Federal, State, Local not specified - PPO

## 2011-03-30 ENCOUNTER — Ambulatory Visit (HOSPITAL_COMMUNITY)
Admission: RE | Admit: 2011-03-30 | Discharge: 2011-03-31 | Disposition: A | Discharge status: Home / Self Care | Payer: Federal, State, Local not specified - PPO | Source: Ambulatory Visit | Attending: Internal Medicine | Admitting: Internal Medicine

## 2011-03-30 DIAGNOSIS — I2589 Other forms of chronic ischemic heart disease: Secondary | ICD-10-CM

## 2011-03-30 DIAGNOSIS — I5022 Chronic systolic (congestive) heart failure: Secondary | ICD-10-CM | POA: Insufficient documentation

## 2011-03-30 DIAGNOSIS — E119 Type 2 diabetes mellitus without complications: Secondary | ICD-10-CM | POA: Insufficient documentation

## 2011-03-30 DIAGNOSIS — Z95810 Presence of automatic (implantable) cardiac defibrillator: Secondary | ICD-10-CM

## 2011-03-30 DIAGNOSIS — Z9861 Coronary angioplasty status: Secondary | ICD-10-CM | POA: Insufficient documentation

## 2011-03-30 DIAGNOSIS — I498 Other specified cardiac arrhythmias: Secondary | ICD-10-CM | POA: Insufficient documentation

## 2011-03-30 DIAGNOSIS — K219 Gastro-esophageal reflux disease without esophagitis: Secondary | ICD-10-CM | POA: Insufficient documentation

## 2011-03-30 DIAGNOSIS — E785 Hyperlipidemia, unspecified: Secondary | ICD-10-CM | POA: Insufficient documentation

## 2011-03-30 DIAGNOSIS — I2510 Atherosclerotic heart disease of native coronary artery without angina pectoris: Secondary | ICD-10-CM | POA: Insufficient documentation

## 2011-03-30 DIAGNOSIS — I509 Heart failure, unspecified: Secondary | ICD-10-CM | POA: Insufficient documentation

## 2011-03-30 HISTORY — DX: Presence of automatic (implantable) cardiac defibrillator: Z95.810

## 2011-03-30 LAB — GLUCOSE, CAPILLARY: Glucose-Capillary: 153 mg/dL — ABNORMAL HIGH (ref 70–99)

## 2011-03-30 LAB — SURGICAL PCR SCREEN: Staphylococcus aureus: NEGATIVE

## 2011-03-31 ENCOUNTER — Ambulatory Visit (HOSPITAL_COMMUNITY): Payer: Federal, State, Local not specified - PPO

## 2011-03-31 LAB — GLUCOSE, CAPILLARY: Glucose-Capillary: 189 mg/dL — ABNORMAL HIGH (ref 70–99)

## 2011-04-04 ENCOUNTER — Telehealth: Payer: Self-pay | Admitting: Physician Assistant

## 2011-04-04 NOTE — Telephone Encounter (Signed)
Returned call from pt concerning increased SOB, relieved with rest and fatigue since addition of coreg during hospitalization for ICD placed 8/29.  He denies orthopnea, PND, or lower extremity edema.  I have instructed him that the addition of a beta blocker can make him feel poorly for up to 2 weeks.  He is extremely concerned about his symptoms.  I have instructed him to cut his coreg in half for the next week until follow up appt with Lawson Fiscal and then we can increase the dose at that time.  He appreciated the call back and voiced understanding.

## 2011-04-05 ENCOUNTER — Telehealth: Payer: Self-pay | Admitting: Internal Medicine

## 2011-04-05 ENCOUNTER — Telehealth: Payer: Self-pay | Admitting: Cardiology

## 2011-04-05 ENCOUNTER — Other Ambulatory Visit: Payer: Self-pay | Admitting: *Deleted

## 2011-04-05 MED ORDER — ENALAPRIL MALEATE 10 MG PO TABS: 5.0000 mg | ORAL_TABLET | Freq: Two times a day (BID) | ORAL | Status: DC

## 2011-04-05 NOTE — Telephone Encounter (Signed)
Called because he recently had a defibrillator placed and he said that he doesn't like the feeling its giving him (Says that he is swimmy headed). Please call back. I have pulled his chart.

## 2011-04-05 NOTE — Telephone Encounter (Signed)
**Note De-identified  Obfuscation** Tried to call number busy 

## 2011-04-05 NOTE — Telephone Encounter (Signed)
Called stating he is not feeling good since pacemaker put in; c/o dizziness. Advised to call device clinic at [facility] office. Note from week-end by Robin  Cut Coreg in half. States feels some better but still doesn't feel "right"

## 2011-04-05 NOTE — Telephone Encounter (Signed)
Per pt call everytime he takes carvadilol he gets SOB and wants to know is that normal

## 2011-04-05 NOTE — Telephone Encounter (Signed)
escribe medication per fax request  

## 2011-04-07 NOTE — Discharge Summary (Signed)
NAMEBERNICE, MULLIN NO.:  [id number]  MEDICAL RECORD NO.:  [id number]  LOCATION:  6525                         FACILITY:  [name]  PHYSICIAN:  Hillis Range, MD       DATE OF BIRTH:  07-24-1934  DATE OF ADMISSION:  03/30/2011 DATE OF DISCHARGE:  03/31/2011                              DISCHARGE SUMMARY   PRIMARY CARE PHYSICIAN:  Clydie Braun L. Hal Hope, MD  PRIMARY CARDIOLOGIST:  [name], MD  ELECTROPHYSIOLOGIST:  Hillis Range, MD  PRIMARY DIAGNOSIS:  Ischemic cardiomyopathy despite optimal medical therapy with last documented ejection fraction of 32%.  SECONDARY DIAGNOSES: 1. Coronary artery disease status post anterior myocardial infarction.     The patient also had percutaneous transluminal coronary angioplasty     to the left anterior descending in 2006. 2. Hyperlipidemia. 3. Diabetes. 4. Symptomatic bradycardia. 5. Gastroesophageal reflux disease. 6. Premature ventricular contractions. 7. Chronic systolic heart failure - class 3.  ALLERGIES:  The patient is allergic to CODEINE.  PROCEDURE THIS ADMISSION: 1. Implantation of a dual-chamber implantable cardiac defibrillator by     Dr. Johney Frame on March 30, 2011.  The patient received St. Jude     Medical model [phone] ICD with model #[id number] right atrial lead model     [phone] right ventricular lead.  DFT at the time of implant was less     than or equal to 15 joules.  The patient had no early apparent     complications. 2. Chest x-ray on March 31, 2011, demonstrated no pneumothorax,     status post device implant.  BRIEF HISTORY OF PRESENT ILLNESS:  Mr. Lesniak is a 77 year old male with a history of ischemic cardiomyopathy and New York Heart Association class II-III heart failure.  He was referred to Dr. Johney Frame in the outpatient setting for consideration of prophylactic ICD implantation. He has had persistent LV dysfunction despite optimal medical therapy. Risks, benefits, and alternatives to ICD  implantation were discussed with the patient, he wished to proceed.  He was also enrolled in the Analyze ST study.  HOSPITAL COURSE:  The patient was admitted on March 30, 2011, for planned implantation of a dual-chamber defibrillator.  This was carried out by Dr. Johney Frame with the details outlined above.  He was monitored on telemetry overnight which demonstrated atrial pacing.  His left chest was without hematoma or ecchymosis.  Chest x-ray was obtained which demonstrated no pneumothorax.  His device was interrogated and found to be functioning normally.  The patient previously had not been on a beta- blocker due to bradycardia, however, now he has an atrial lead.  This is no longer prohibited.  Per Dr. Johney Frame and Dr. Swaziland, the patient will be placed on Coreg 6.25 mg 1 tablet twice daily.  Dr. Johney Frame examined the patient on March 31, 2011, and considered him stable for discharge.  DISCHARGE INSTRUCTIONS: 1. Increase activity slowly. 2. No driving for 1 week. 3. Follow a low-sodium, heart-healthy diet. 4. See supplemental device discharge instructions for wound care and     arm mobility. 5. Keep incision clean and dry for 1 week.  FOLLOWUP APPOINTMENTS: 1. [facility] on April 11, 2011,  at 3:30     p.m. 2. Dr. Swaziland with Norma Fredrickson, nurse practitioner, on April 20, 2011, at 9:45 a.m. 3. Dr. Johney Frame on June 30, 2011, at 11 a.m. 4. Dr. Hal Hope as scheduled.  DISCHARGE MEDICATIONS: 1. Coreg 6.25 mg 1 tablet twice daily - This is a new prescription for     the patient. 2. Aspirin 81 mg daily. 3. Enalapril 10 mg one-half tablet twice daily. 4. Furosemide 20 mg daily. 5. Glimepiride 2 mg daily. 6. Klor-Con 20 mEq daily. 7. Nitroglycerin patch 0.2 mg per hour daily. 8. Omeprazole 20 mg daily. 9. Pravastatin 40 mg daily. 10.ProAir inhaler 1-2 puffs every 4 hours as needed. 11.TriCor 145 mg daily. 12.Vitamin D3, 2000 units twice  daily.  DISPOSITION:  Home.  The patient was seen and examined by Dr. Johney Frame on March 31, 2011 and considered stable for discharge.  DURATION DISCHARGE ENCOUNTER:  Thirty five minutes.     Gypsy Balsam, RN,BSN   ______________________________ Hillis Range, MD    AS/MEDQ  D:  03/31/2011  T:  03/31/2011  Job:  [id number]  cc:   [name], M.D. Marcos Eke. Hal Hope, M.D.  Electronically Signed by Gypsy Balsam RNBSN on 03/31/2011 02:34:09 PM Electronically Signed by Hillis Range MD on 04/07/2011 08:52:46 AM

## 2011-04-07 NOTE — Telephone Encounter (Signed)
lmom for patient to call me back. 

## 2011-04-07 NOTE — Op Note (Signed)
NAMEZEBADIAH, Antonio Ray NO.:  [id number]  MEDICAL RECORD NO.:  [id number]  LOCATION:                                 FACILITY:  PHYSICIAN:  Hillis Range, MD       DATE OF BIRTH:  [birth year]  DATE OF PROCEDURE: DATE OF DISCHARGE:                              OPERATIVE REPORT   SURGEON:  Hillis Range, MD  PREPROCEDURE DIAGNOSES: 1. Ischemic cardiomyopathy. 2. Chronic systolic dysfunction. 3. Coronary artery disease. 4. Bradycardia.  POSTPROCEDURE DIAGNOSES: 1. Ischemic cardiomyopathy. 2. Chronic systolic dysfunction. 3. Coronary artery disease. 4. Bradycardia.  PROCEDURE:  Implantable cardioverter-defibrillator implantation with defibrillation threshold testing.  INTRODUCTION:  Mr. Antonio Ray is a pleasant 77 year old gentleman with a history of coronary artery disease status post prior PTCA of the LAD and following MI in 1994, chronic systolic dysfunction (ejection fraction 32%), and New York Heart Association class 3 congestive heart failure who now presents for ICD implantation for primary prevention of sudden cardiac death.  He meets MADIT II and SCD-HeFT criteria for ICD implantation.  He has been treated with an optimal medical regimen, but has been unable to take beta-blockers due to symptomatic bradycardia. He therefore presents today for ICD implantation.  DESCRIPTION OF PROCEDURE:  Informed written consent was obtained and the patient was brought to the Electrophysiology Lab in the fasting state. He was adequately sedated with intravenous Versed as outlined in the nursing report.  The patient's left chest was prepped and draped in the usual sterile fashion by the EP Lab staff.  The skin overlying the left deltopectoral region was infiltrated with lidocaine for local analgesia. A 4-cm incision was made over the left deltopectoral region.  A left subcutaneous ICD pocket was fashioned using a combination of sharp and blunt dissection.   Electrocautery was required to assure hemostasis. The left axillary vein was cannulated with fluoroscopic visualization. No contrast was required for this endeavor.  Through the left axillary vein, a St. Jude Medical Tendril model [phone] (serial number [id number]) right atrial lead and a St. Jude Medical Durata model [phone] (serial number [id number]) right ventricular lead were advanced with fluoroscopic visualization into the right atrial appendage and right ventricular apical septum positions respectively.  Atrial lead P-waves measured 2.6 mV with an impedance of 706 ohms and a threshold of 2 volts at 0.5 milliseconds.  Right ventricular lead R-waves measured 11.1 mV with an impedance of 776 ohms and a threshold of 0.6 volts at 0.5 milliseconds.  Both leads were secured to the pectoralis fascia using #2 silk suture over the suture sleeves.  The leads were then connected to a St. Jude Medical Fortify ST model [phone] (serial number [id number]) ICD.  The pocket was irrigated with copious gentamicin solution.  The defibrillator was then placed into the pocket.  The pocket was then closed in 2 layers with 2.0 Vicryl suture for the subcutaneous and subcuticular layers.  Steri-Strips and a sterile dressing were then applied.  There were no early apparent complications.  Defibrillation threshold testing was then performed.  Ventricular fibrillation was induced with a T-wave shock.  Adequate sensing of ventricular fibrillation was observed with no  dropout with a programed sensitivity of 1.0 mV.  Ventricular fibrillation was successfully defibrillated with a single 15-joule shock delivered from the device with an impedance of 83 ohms and a duration of 4 seconds.  The patient remained in sinus rhythm thereafter.  After a 4-minute recovery period, programed extrastimulus testing was performed from the right ventricular apical septum through the device with a basic cycle length of  500 milliseconds with S1, S2, S3, S4 extrastimuli down to refractoriness (500/260/240/200 milliseconds) with no inducible ventricular tachycardia or ventricular fibrillation.  The procedure was therefore considered completed.  There were no early apparent complications.  CONCLUSIONS: 1. Successful implantation of a St. Jude Medical Fortify ST dual-     chamber ICD for primary prevention of sudden cardiac death. 2. DFT less than or equal to 15 joules. 3. No inducible ventricular tachycardia or ventricular fibrillation. 4. No early apparent complications.     Hillis Range, MD     JA/MEDQ  D:  03/30/2011  T:  03/30/2011  Job:  [id number]  cc:   Antonio Ray, M.D.  Electronically Signed by Hillis Range MD on 04/07/2011 08:52:48 AM

## 2011-04-08 NOTE — Telephone Encounter (Signed)
Stop Carvedilol per Dr Johney Frame  SOB is better when it's not so hot outside  He says he stopped the NTG patch on his on and he is going to start this back now  He has a follow up scheduled with Dr Swaziland next week  Dr Johney Frame says to keep the follow up  I have told him to call if the SOB does not get better

## 2011-04-08 NOTE — Telephone Encounter (Signed)
Patient calling C/O sob. H/O defib placement last week

## 2011-04-11 ENCOUNTER — Ambulatory Visit: Payer: Federal, State, Local not specified - PPO | Admitting: *Deleted

## 2011-04-18 ENCOUNTER — Ambulatory Visit (INDEPENDENT_AMBULATORY_CARE_PROVIDER_SITE_OTHER): Payer: Federal, State, Local not specified - PPO | Admitting: *Deleted

## 2011-04-18 ENCOUNTER — Encounter: Payer: Self-pay | Admitting: Internal Medicine

## 2011-04-18 DIAGNOSIS — I428 Other cardiomyopathies: Secondary | ICD-10-CM

## 2011-04-18 DIAGNOSIS — I5022 Chronic systolic (congestive) heart failure: Secondary | ICD-10-CM

## 2011-04-18 LAB — ICD DEVICE OBSERVATION
AL AMPLITUDE: 1.5 mv
AL THRESHOLD: 0.75 V
DEVICE MODEL ICD: 828604
HV IMPEDENCE: 77 Ohm
MODE SWITCH EPISODES: 0
PACEART VT: 0
RV LEAD THRESHOLD: 0.5 V
TOT-0007: 1
TOT-0009: 1
TOT-0010: 2
VF: 0

## 2011-04-18 NOTE — Progress Notes (Signed)
Wound check-ICD by industry

## 2011-04-20 ENCOUNTER — Encounter: Payer: Self-pay | Admitting: Nurse Practitioner

## 2011-04-20 ENCOUNTER — Ambulatory Visit (INDEPENDENT_AMBULATORY_CARE_PROVIDER_SITE_OTHER): Payer: Federal, State, Local not specified - PPO | Admitting: Nurse Practitioner

## 2011-04-20 VITALS — BP 130/72 | HR 66 | Ht 70.0 in | Wt 202.6 lb

## 2011-04-20 DIAGNOSIS — I5022 Chronic systolic (congestive) heart failure: Secondary | ICD-10-CM

## 2011-04-20 DIAGNOSIS — R0600 Dyspnea, unspecified: Secondary | ICD-10-CM

## 2011-04-20 DIAGNOSIS — I2510 Atherosclerotic heart disease of native coronary artery without angina pectoris: Secondary | ICD-10-CM

## 2011-04-20 DIAGNOSIS — R0989 Other specified symptoms and signs involving the circulatory and respiratory systems: Secondary | ICD-10-CM

## 2011-04-20 DIAGNOSIS — I495 Sick sinus syndrome: Secondary | ICD-10-CM

## 2011-04-20 DIAGNOSIS — I509 Heart failure, unspecified: Secondary | ICD-10-CM

## 2011-04-20 DIAGNOSIS — Z95810 Presence of automatic (implantable) cardiac defibrillator: Secondary | ICD-10-CM | POA: Insufficient documentation

## 2011-04-20 DIAGNOSIS — R0609 Other forms of dyspnea: Secondary | ICD-10-CM

## 2011-04-20 LAB — BASIC METABOLIC PANEL
BUN: 34 mg/dL — ABNORMAL HIGH (ref 6–23)
CO2: 30 mEq/L (ref 19–32)
Calcium: 9.3 mg/dL (ref 8.4–10.5)
Chloride: 102 mEq/L (ref 96–112)
Creatinine, Ser: 1.4 mg/dL (ref 0.4–1.5)
GFR: 51.57 mL/min — ABNORMAL LOW (ref >60.00)
Glucose, Bld: 128 mg/dL — ABNORMAL HIGH (ref 70–99)
Potassium: 5.3 mEq/L — ABNORMAL HIGH (ref 3.5–5.1)
Sodium: 140 mEq/L (ref 135–145)

## 2011-04-20 LAB — BRAIN NATRIURETIC PEPTIDE: Pro B Natriuretic peptide (BNP): 63 pg/mL (ref 0.0–100.0)

## 2011-04-20 NOTE — Assessment & Plan Note (Signed)
Now with atrial lead along with his ICD in place.

## 2011-04-20 NOTE — Assessment & Plan Note (Signed)
He did not tolerate the Coreg. He may have just not given enough time to get adjusted to it. He is not willing to try at a lower dose. He is on ACE and diuretic. May be a candidate for aldactone and/or hydralazine in the future. He is to continue to weigh daily and watch salt use.

## 2011-04-20 NOTE — Assessment & Plan Note (Signed)
No symptoms at present. Not able to take beta blockers.

## 2011-04-20 NOTE — Assessment & Plan Note (Signed)
ICD site looks ok. His left arm is ok. I told him that if he has recurrent swelling in the left arm to let us know. Doppler will need to be ordered.

## 2011-04-20 NOTE — Patient Instructions (Addendum)
Continue with your current medicines. Weigh yourself each morning and record. Take extra dose of diuretic (furosemide) for weight gain of 3 pounds in 24 hours. Limit sodium intake.   I want to see you in a month. Call for any problems.  I would encourage you to get a new blood pressure cuff. If you don't just remember that your wrist cuff seems to run about 10 points lower

## 2011-04-20 NOTE — Progress Notes (Signed)
Antonio Ray Date of Birth: 1934-03-07   History of Present Illness: Antonio Ray is seen back today for his post hospital visit. He is seen for Antonio Ray and Antonio Ray. He has had his ICD implanted. Also was given an atrial lead. He was given a trial of Coreg. He says this made him sick. He got so short of breath and dizzy. It was stopped by Antonio Ray. He now feels better and doesn't appear to be having any CHF symptoms. His wound has done ok. He does note some swelling of the left arm initially but says it has now resolved. Blood pressure diary is reviewed. He has some inconsistent readings. He uses a wrist cuff. It is running about 10 points lower than the blood pressure I checked. No chest pain. No edema of the legs. No cough. No PND or orthopnea. He is now tolerating his current medicines.   Current Outpatient Prescriptions on File Prior to Visit  Medication Sig Dispense Refill  . aspirin 81 MG EC tablet Take 81 mg by mouth daily.        . Cholecalciferol (VITAMIN D PO) Take by mouth daily.        . enalapril (VASOTEC) 10 MG tablet Take 0.5 tablets (5 mg total) by mouth 2 (two) times daily.  60 tablet  5  . fenofibrate (TRICOR) 145 MG tablet Take 1 tablet (145 mg total) by mouth daily.  30 tablet  11  . furosemide (LASIX) 20 MG tablet Take 1 tablet (20 mg total) by mouth daily.  30 tablet  5  . glimepiride (AMARYL) 2 MG tablet Take 1 mg by mouth 2 (two) times daily.        . nitroGLYCERIN (NITRODUR - DOSED IN MG/24 HR) 0.2 mg/hr Place 1 patch (0.2 mg total) onto the skin daily.  30 patch  5  . omeprazole (PRILOSEC OTC) 20 MG tablet Take 20 mg by mouth daily.        . potassium chloride SA (K-DUR,KLOR-CON) 20 MEQ tablet Take 1 tablet (20 mEq total) by mouth daily.  30 tablet  5  . pravastatin (PRAVACHOL) 40 MG tablet Take 1 tablet (40 mg total) by mouth every evening.  30 tablet  5    Allergies  Allergen Reactions  . Carvedilol   . Codeine     REACTION: Reaction not known     Past Medical History  Diagnosis Date  . GERD (gastroesophageal reflux disease)   . Coronary artery disease     History of remote anterior MI with PCI to LAD in 2006; most recent cath 2007, no intervention required  . MI, old 2007    ANTERIOR  . Chronic systolic dysfunction of left ventricle     EF 30-35%, CLASS II - III SYMPTOMS  . Hyperlipidemia   . PVC's (premature ventricular contractions)   . Diabetes mellitus   . DOE (dyspnea on exertion)   . ICD (implantable cardiac defibrillator) in place August 2012  . PVC (premature ventricular contraction)   . Intolerance, drug     Intolerant to beta blockers    Past Surgical History  Procedure Date  . Cardiac catheterization 01/11/2006    DEMONSTRATES AKINESIA OF THE DISTAL ANTERIOR WALL, DISTAL INFERIOR WALL AND AKINESIA OF THE APEX. THE BASAL SEGMENTS CONTRACT WELL AND OVERALL EF 35%  . Coronary angioplasty 1994    TO THE LAD  . Foot surgery     remote  . Cholecystectomy 2/11  . Icd implant  8/12    History  Smoking status  . Former Smoker  . Quit date: 08/01/1992  Smokeless tobacco  . Not on file    History  Alcohol Use No    Family History  Problem Relation Age of Onset  . Stroke Father     Review of Systems: The review of systems is positive for intolerance to Coreg. He has been intolerant to beta blockers in the past.  All other systems were reviewed and are negative.  Physical Exam: BP 130/72  Pulse 66  Ht 5\' 10"  (1.778 m)  Wt 202 lb 9.6 oz (91.899 kg)  BMI 29.07 kg/m2 Patient is very pleasant and in no acute distress. He is obese. Skin is warm and dry. Color is normal.  HEENT is unremarkable. Normocephalic/atraumatic. PERRL. Sclera are nonicteric. Neck is supple. No masses. No JVD. Lungs are clear. Cardiac exam shows a regular rate and rhythm. ICD is on the left and looks ok. Left arm without edema today. Abdomen is obese but soft. Extremities are without edema. Gait and ROM are intact. No gross  neurologic deficits noted.  LABORATORY DATA: BMET and BNP pending  Assessment / Plan:

## 2011-04-26 ENCOUNTER — Telehealth: Payer: Self-pay | Admitting: Cardiology

## 2011-04-26 NOTE — Telephone Encounter (Signed)
lm

## 2011-04-26 NOTE — Telephone Encounter (Signed)
Returning call back to nurse.  

## 2011-04-26 NOTE — Progress Notes (Signed)
lm

## 2011-04-27 ENCOUNTER — Telehealth: Payer: Self-pay | Admitting: *Deleted

## 2011-04-27 DIAGNOSIS — I509 Heart failure, unspecified: Secondary | ICD-10-CM

## 2011-04-27 NOTE — Telephone Encounter (Signed)
Notified of lab results. Will repeat bmet in 1 wk. Appointment made.

## 2011-04-27 NOTE — Telephone Encounter (Signed)
Message copied by Lorayne Bender on Wed Apr 27, 2011  8:56 AM ------      Message from: Antonio Ray      Created: Wed Apr 20, 2011  3:37 PM       Ok to report. May stop the potassium. Recheck BMET in one week.

## 2011-04-27 NOTE — Telephone Encounter (Signed)
Pt rtn call 

## 2011-05-04 ENCOUNTER — Other Ambulatory Visit (INDEPENDENT_AMBULATORY_CARE_PROVIDER_SITE_OTHER): Payer: Federal, State, Local not specified - PPO | Admitting: *Deleted

## 2011-05-04 DIAGNOSIS — I5020 Unspecified systolic (congestive) heart failure: Secondary | ICD-10-CM

## 2011-05-04 LAB — BASIC METABOLIC PANEL
BUN: 29 mg/dL — ABNORMAL HIGH (ref 6–23)
CO2: 30 mEq/L (ref 19–32)
Chloride: 104 mEq/L (ref 96–112)
Creatinine, Ser: 1.4 mg/dL (ref 0.4–1.5)
Glucose, Bld: 124 mg/dL — ABNORMAL HIGH (ref 70–99)

## 2011-05-05 ENCOUNTER — Telehealth: Payer: Self-pay | Admitting: *Deleted

## 2011-05-05 NOTE — Telephone Encounter (Signed)
Lm w/lab results. Will send to Dr. Hal Hope.

## 2011-05-05 NOTE — Telephone Encounter (Signed)
Message copied by Lorayne Bender on Thu May 05, 2011  5:49 PM ------      Message from: [place], PETER M      Created: Wed May 04, 2011  4:32 PM       Bmet is good. creatnine stable. Potassium normal.      Peter Swaziland

## 2011-05-10 ENCOUNTER — Other Ambulatory Visit: Payer: Self-pay | Admitting: Dermatology

## 2011-05-16 ENCOUNTER — Telehealth: Payer: Self-pay | Admitting: Internal Medicine

## 2011-05-16 NOTE — Telephone Encounter (Signed)
lmom for patient that his transmission looks good Call us if these episodes happen again

## 2011-05-16 NOTE — Telephone Encounter (Signed)
The transmission was normal.  I spoke with Antonio Ray and she is going to walk the transmission over to Dr Johney Frame and then I will call him back to see if he has any recomendations

## 2011-05-16 NOTE — Telephone Encounter (Signed)
Pt calling wanting to speak with someone about pacemaker implantation, pt had some questions.

## 2011-05-16 NOTE — Telephone Encounter (Signed)
Had a sensation on Friday while sitting on bed It felt like "someone came up behind him and shock him all over, no pain"  He has spoken with Debby Bud and they are trying to get a download from his device at present and then he was going to call him back  I will cal patient back before lunch and see what they find

## 2011-05-20 ENCOUNTER — Ambulatory Visit (INDEPENDENT_AMBULATORY_CARE_PROVIDER_SITE_OTHER): Payer: Federal, State, Local not specified - PPO | Admitting: Nurse Practitioner

## 2011-05-20 ENCOUNTER — Encounter: Payer: Self-pay | Admitting: Nurse Practitioner

## 2011-05-20 VITALS — BP 104/59 | HR 62 | Ht 70.0 in | Wt 204.4 lb

## 2011-05-20 DIAGNOSIS — I509 Heart failure, unspecified: Secondary | ICD-10-CM

## 2011-05-20 DIAGNOSIS — I5022 Chronic systolic (congestive) heart failure: Secondary | ICD-10-CM

## 2011-05-20 DIAGNOSIS — Z95810 Presence of automatic (implantable) cardiac defibrillator: Secondary | ICD-10-CM

## 2011-05-20 NOTE — Patient Instructions (Signed)
Continue with your current medicines. Weigh yourself each morning and record. Take extra dose of diuretic for weight gain of 3 pounds in 24 hours.  Call the office if you have any concerns. Limit sodium intake. Goal is to have less than 2000 mg (2gm) of salt per day.

## 2011-05-20 NOTE — Assessment & Plan Note (Signed)
He appears to be at Class II to me. We discussed adding low dose aldactone but he wants to hold off. He is on ACE and diuretics. Not able to tolerate beta blockers. We will see him back in 3 months. He is to continue to weigh daily and adjust lasix as needed. Patient is agreeable to this plan and will call if any problems develop in the interim.

## 2011-05-20 NOTE — Assessment & Plan Note (Signed)
ICD is in place. Has had device checked. No etiology for this "jolt feeling" and fortunately, no recurrence. Has follow up in device clinic next month.

## 2011-05-20 NOTE — Progress Notes (Signed)
Holland Johnsie Kindred Date of Birth: 24-Nov-1933 Medical Record #[id number]  History of Present Illness: Mr. Gonyer is seen back today for a one month check. He is seen for Dr. Sw[place]iland. He has an EF of 30 to 35% with class II to III symptoms. He has had his ICD implanted along with an atrial lead. He failed a trial of Coreg and overall is intolerant to beta blockers. He says he is doing ok. He does get a little dizzy with standing up too quickly. Weights have been stable at home. No edema, PND, orthopnea or cough reported. No chest pain. He has noted a "jolting" feeling since his ICD implant. He thought it had discharged. He has had his device checked and was told no shocks and was having normal device function.   Current Outpatient Prescriptions on File Prior to Visit  Medication Sig Dispense Refill  . aspirin 81 MG EC tablet Take 81 mg by mouth daily.        . Cholecalciferol (VITAMIN D PO) Take by mouth daily.        . enalapril (VASOTEC) 10 MG tablet Take 0.5 tablets (5 mg total) by mouth 2 (two) times daily.  60 tablet  5  . fenofibrate (TRICOR) 145 MG tablet Take 1 tablet (145 mg total) by mouth daily.  30 tablet  11  . furosemide (LASIX) 20 MG tablet Take 1 tablet (20 mg total) by mouth daily.  30 tablet  5  . glimepiride (AMARYL) 2 MG tablet Take 2 mg by mouth daily before breakfast.       . nitroGLYCERIN (NITRODUR - DOSED IN MG/24 HR) 0.2 mg/hr Place 1 patch (0.2 mg total) onto the skin daily.  30 patch  5  . pravastatin (PRAVACHOL) 40 MG tablet Take 1 tablet (40 mg total) by mouth every evening.  30 tablet  5    Allergies  Allergen Reactions  . Carvedilol   . Codeine     REACTION: Reaction not known  . Levofloxacin (Levaquin)   . Morphine And Related     Past Medical History  Diagnosis Date  . GERD (gastroesophageal reflux disease)   . Coronary artery disease     History of remote anterior MI with PCI to LAD in 2006; most recent cath 2007, no intervention required  . MI, old  2007    ANTERIOR  . Chronic systolic dysfunction of left ventricle     EF 30-35%, CLASS II - III SYMPTOMS; intolerant to Coreg  . Hyperlipidemia   . PVC's (premature ventricular contractions)   . Diabetes mellitus   . DOE (dyspnea on exertion)   . ICD (implantable cardiac defibrillator) in place August 2012  . PVC (premature ventricular contraction)   . Intolerance, drug     Intolerant to beta blockers    Past Surgical History  Procedure Date  . Cardiac catheterization 01/11/2006    DEMONSTRATES AKINESIA OF THE DISTAL ANTERIOR WALL, DISTAL INFERIOR WALL AND AKINESIA OF THE APEX. THE BASAL SEGMENTS CONTRACT WELL AND OVERALL EF 35%  . Coronary angioplasty 1994    TO THE LAD  . Foot surgery     remote  . Cholecystectomy 2/11  . Icd implant 8/12    History  Smoking status  . Former Smoker  . Quit date: 08/01/1992  Smokeless tobacco  . Not on file    History  Alcohol Use No    Family History  Problem Relation Age of Onset  . Stroke Father  Review of Systems: The review of systems is positive for some arthritis.  All other systems were reviewed and are negative.  Physical Exam: BP 104/59  Pulse 62  Ht 5\' 10"  (1.778 m)  Wt 204 lb 6.4 oz (92.715 kg)  BMI 29.33 kg/m2 Patient is very pleasant and in no acute distress. Skin is warm and dry. Color is normal.  HEENT is unremarkable. Normocephalic/atraumatic. PERRL. Sclera are nonicteric. Neck is supple. No masses. No JVD. Lungs are clear. Cardiac exam shows a regular rate and rhythm. Abdomen is soft. Extremities are without edema. Gait and ROM are intact. No gross neurologic deficits noted.   LABORATORY DATA:   Assessment / Plan:

## 2011-06-06 ENCOUNTER — Other Ambulatory Visit: Payer: Self-pay

## 2011-06-06 DIAGNOSIS — E7800 Pure hypercholesterolemia, unspecified: Secondary | ICD-10-CM

## 2011-06-06 MED ORDER — PRAVASTATIN SODIUM 40 MG PO TABS: 40.0000 mg | ORAL_TABLET | Freq: Every evening | ORAL | Status: DC

## 2011-06-18 ENCOUNTER — Other Ambulatory Visit: Payer: Self-pay

## 2011-06-18 ENCOUNTER — Encounter (HOSPITAL_COMMUNITY): Payer: Self-pay | Admitting: Radiology

## 2011-06-18 ENCOUNTER — Emergency Department (HOSPITAL_COMMUNITY): Payer: Federal, State, Local not specified - PPO

## 2011-06-18 ENCOUNTER — Inpatient Hospital Stay (HOSPITAL_COMMUNITY)
Admission: EM | Admit: 2011-06-18 | Discharge: 2011-06-20 | DRG: 089 | Disposition: A | Discharge status: Home / Self Care | Payer: Federal, State, Local not specified - PPO | Attending: Family Medicine | Admitting: Family Medicine

## 2011-06-18 DIAGNOSIS — Z95810 Presence of automatic (implantable) cardiac defibrillator: Secondary | ICD-10-CM

## 2011-06-18 DIAGNOSIS — E785 Hyperlipidemia, unspecified: Secondary | ICD-10-CM | POA: Diagnosis present

## 2011-06-18 DIAGNOSIS — J159 Unspecified bacterial pneumonia: Secondary | ICD-10-CM

## 2011-06-18 DIAGNOSIS — I252 Old myocardial infarction: Secondary | ICD-10-CM

## 2011-06-18 DIAGNOSIS — Z7982 Long term (current) use of aspirin: Secondary | ICD-10-CM

## 2011-06-18 DIAGNOSIS — I2510 Atherosclerotic heart disease of native coronary artery without angina pectoris: Secondary | ICD-10-CM | POA: Diagnosis present

## 2011-06-18 DIAGNOSIS — E118 Type 2 diabetes mellitus with unspecified complications: Secondary | ICD-10-CM

## 2011-06-18 DIAGNOSIS — J189 Pneumonia, unspecified organism: Principal | ICD-10-CM

## 2011-06-18 DIAGNOSIS — A419 Sepsis, unspecified organism: Secondary | ICD-10-CM

## 2011-06-18 DIAGNOSIS — R4182 Altered mental status, unspecified: Secondary | ICD-10-CM | POA: Diagnosis present

## 2011-06-18 DIAGNOSIS — R0902 Hypoxemia: Secondary | ICD-10-CM

## 2011-06-18 DIAGNOSIS — I5023 Acute on chronic systolic (congestive) heart failure: Secondary | ICD-10-CM

## 2011-06-18 DIAGNOSIS — E7800 Pure hypercholesterolemia, unspecified: Secondary | ICD-10-CM

## 2011-06-18 DIAGNOSIS — E119 Type 2 diabetes mellitus without complications: Secondary | ICD-10-CM | POA: Diagnosis present

## 2011-06-18 DIAGNOSIS — E860 Dehydration: Secondary | ICD-10-CM | POA: Diagnosis present

## 2011-06-18 DIAGNOSIS — I4949 Other premature depolarization: Secondary | ICD-10-CM | POA: Diagnosis present

## 2011-06-18 DIAGNOSIS — J441 Chronic obstructive pulmonary disease with (acute) exacerbation: Secondary | ICD-10-CM | POA: Diagnosis present

## 2011-06-18 LAB — URINALYSIS, ROUTINE W REFLEX MICROSCOPIC
Glucose, UA: 500 mg/dL — AB
Hgb urine dipstick: NEGATIVE
Leukocytes, UA: NEGATIVE
Protein, ur: NEGATIVE mg/dL
Specific Gravity, Urine: 1.024 (ref 1.005–1.030)
pH: 5 (ref 5.0–8.0)

## 2011-06-18 LAB — DIFFERENTIAL
Lymphocytes Relative: 4 % — ABNORMAL LOW (ref 12–46)
Lymphs Abs: 0.7 10*3/uL (ref 0.7–4.0)
Neutro Abs: 17 10*3/uL — ABNORMAL HIGH (ref 1.7–7.7)

## 2011-06-18 LAB — CBC
HCT: 42.5 % (ref 39.0–52.0)
Hemoglobin: 13.9 g/dL (ref 13.0–17.0)
MCH: 30.1 pg (ref 26.0–34.0)
MCHC: 32.7 g/dL (ref 30.0–36.0)
MCV: 92 fL (ref 78.0–100.0)
RDW: 13.3 % (ref 11.5–15.5)

## 2011-06-18 LAB — COMPREHENSIVE METABOLIC PANEL
ALT: 30 U/L (ref 0–53)
Alkaline Phosphatase: 31 U/L — ABNORMAL LOW (ref 39–117)
CO2: 24 mEq/L (ref 19–32)
Chloride: 103 mEq/L (ref 96–112)
GFR calc Af Amer: 60 mL/min — ABNORMAL LOW (ref >90)
GFR calc non Af Amer: 52 mL/min — ABNORMAL LOW (ref >90)
Glucose, Bld: 204 mg/dL — ABNORMAL HIGH (ref 70–99)
Potassium: 4 mEq/L (ref 3.5–5.1)
Sodium: 137 mEq/L (ref 135–145)
Total Protein: 6.8 g/dL (ref 6.0–8.3)

## 2011-06-18 LAB — HEPATIC FUNCTION PANEL
Albumin: 3.1 g/dL — ABNORMAL LOW (ref 3.5–5.2)
Indirect Bilirubin: 0.3 mg/dL (ref 0.3–0.9)
Total Protein: 6.6 g/dL (ref 6.0–8.3)

## 2011-06-18 LAB — GLUCOSE, CAPILLARY
Glucose-Capillary: 133 mg/dL — ABNORMAL HIGH (ref 70–99)
Glucose-Capillary: 195 mg/dL — ABNORMAL HIGH (ref 70–99)

## 2011-06-18 LAB — TROPONIN I: Troponin I: <0.3 ng/mL (ref <0.30)

## 2011-06-18 MED ORDER — ALBUTEROL SULFATE (5 MG/ML) 0.5% IN NEBU: 2.5000 mg | INHALATION_SOLUTION | Freq: Three times a day (TID) | RESPIRATORY_TRACT | Status: DC

## 2011-06-18 MED ORDER — ALBUTEROL SULFATE (5 MG/ML) 0.5% IN NEBU
2.5000 mg | INHALATION_SOLUTION | Freq: Three times a day (TID) | RESPIRATORY_TRACT | Status: DC
  Administered 2011-06-19 – 2011-06-20 (×5): 2.5 mg via RESPIRATORY_TRACT
  Filled 2011-06-18 (×5): qty 0.5

## 2011-06-18 MED ORDER — IPRATROPIUM BROMIDE 0.02 % IN SOLN: 0.5000 mg | RESPIRATORY_TRACT | Status: DC

## 2011-06-18 MED ORDER — SODIUM CHLORIDE 0.9 % IV SOLN
INTRAVENOUS | Status: DC
  Administered 2011-06-18: 19:00:00 via INTRAVENOUS

## 2011-06-18 MED ORDER — PREDNISONE 50 MG PO TABS
60.0000 mg | ORAL_TABLET | Freq: Every day | ORAL | Status: DC
  Administered 2011-06-18 – 2011-06-20 (×3): 60 mg via ORAL
  Filled 2011-06-18 (×3): qty 1

## 2011-06-18 MED ORDER — ACETAMINOPHEN 650 MG RE SUPP: 650.0000 mg | Freq: Four times a day (QID) | RECTAL | Status: DC | PRN

## 2011-06-18 MED ORDER — VITAMIN D3 25 MCG (1000 UNIT) PO TABS
1000.0000 [IU] | ORAL_TABLET | Freq: Every day | ORAL | Status: DC
  Administered 2011-06-18 – 2011-06-20 (×3): 1000 [IU] via ORAL
  Filled 2011-06-18 (×3): qty 1

## 2011-06-18 MED ORDER — NITROGLYCERIN 0.2 MG/HR TD PT24
0.2000 mg | MEDICATED_PATCH | Freq: Every day | TRANSDERMAL | Status: DC
  Administered 2011-06-18 – 2011-06-20 (×3): 0.2 mg via TRANSDERMAL
  Filled 2011-06-18 (×3): qty 1

## 2011-06-18 MED ORDER — ONDANSETRON HCL 4 MG PO TABS: 4.0000 mg | ORAL_TABLET | Freq: Four times a day (QID) | ORAL | Status: DC | PRN

## 2011-06-18 MED ORDER — ONDANSETRON HCL 4 MG/2ML IJ SOLN: 4.0000 mg | Freq: Four times a day (QID) | INTRAMUSCULAR | Status: DC | PRN

## 2011-06-18 MED ORDER — IPRATROPIUM BROMIDE 0.02 % IN SOLN
0.5000 mg | RESPIRATORY_TRACT | Status: DC
  Administered 2011-06-18 (×2): 0.5 mg via RESPIRATORY_TRACT
  Filled 2011-06-18 (×2): qty 2.5

## 2011-06-18 MED ORDER — SODIUM CHLORIDE 0.9 % IV SOLN
1000.0000 mL | INTRAVENOUS | Status: AC
  Administered 2011-06-18: 1000 mL via INTRAVENOUS

## 2011-06-18 MED ORDER — FENOFIBRATE 160 MG PO TABS
160.0000 mg | ORAL_TABLET | Freq: Every day | ORAL | Status: DC
  Administered 2011-06-18 – 2011-06-20 (×3): 160 mg via ORAL
  Filled 2011-06-18 (×3): qty 1

## 2011-06-18 MED ORDER — ACETAMINOPHEN 325 MG PO TABS: 650.0000 mg | ORAL_TABLET | Freq: Four times a day (QID) | ORAL | Status: DC | PRN

## 2011-06-18 MED ORDER — SODIUM CHLORIDE 0.9 % IV BOLUS (SEPSIS)
2000.0000 mL | Freq: Once | INTRAVENOUS | Status: AC
  Administered 2011-06-18: 1000 mL via INTRAVENOUS

## 2011-06-18 MED ORDER — PANTOPRAZOLE SODIUM 40 MG PO TBEC
40.0000 mg | DELAYED_RELEASE_TABLET | Freq: Every day | ORAL | Status: DC
  Administered 2011-06-18 – 2011-06-20 (×3): 40 mg via ORAL
  Filled 2011-06-18 (×3): qty 1

## 2011-06-18 MED ORDER — SODIUM CHLORIDE 0.9 % IV BOLUS (SEPSIS)
500.0000 mL | Freq: Once | INTRAVENOUS | Status: AC
  Administered 2011-06-18: 1000 mL via INTRAVENOUS

## 2011-06-18 MED ORDER — ENALAPRIL MALEATE 5 MG PO TABS
5.0000 mg | ORAL_TABLET | Freq: Two times a day (BID) | ORAL | Status: DC
  Administered 2011-06-18 – 2011-06-20 (×4): 5 mg via ORAL
  Filled 2011-06-18 (×6): qty 1

## 2011-06-18 MED ORDER — IPRATROPIUM BROMIDE 0.02 % IN SOLN
0.5000 mg | Freq: Three times a day (TID) | RESPIRATORY_TRACT | Status: DC
  Administered 2011-06-19 – 2011-06-20 (×5): 0.5 mg via RESPIRATORY_TRACT
  Filled 2011-06-18 (×5): qty 2.5

## 2011-06-18 MED ORDER — DEXTROSE 5 % IV SOLN
1.0000 g | Freq: Once | INTRAVENOUS | Status: AC
  Administered 2011-06-18: 1 g via INTRAVENOUS
  Filled 2011-06-18: qty 10

## 2011-06-18 MED ORDER — DEXTROSE 5 % IV SOLN
500.0000 mg | Freq: Once | INTRAVENOUS | Status: AC
  Administered 2011-06-18: 500 mg via INTRAVENOUS
  Filled 2011-06-18 (×2): qty 500

## 2011-06-18 MED ORDER — SIMVASTATIN 20 MG PO TABS
20.0000 mg | ORAL_TABLET | Freq: Every day | ORAL | Status: DC
  Administered 2011-06-18 – 2011-06-20 (×3): 20 mg via ORAL
  Filled 2011-06-18 (×3): qty 1

## 2011-06-18 MED ORDER — ASPIRIN EC 81 MG PO TBEC
81.0000 mg | DELAYED_RELEASE_TABLET | Freq: Every day | ORAL | Status: DC
  Administered 2011-06-18 – 2011-06-20 (×3): 81 mg via ORAL
  Filled 2011-06-18 (×3): qty 1

## 2011-06-18 MED ORDER — ALBUTEROL SULFATE (5 MG/ML) 0.5% IN NEBU
2.5000 mg | INHALATION_SOLUTION | RESPIRATORY_TRACT | Status: DC
  Administered 2011-06-18 (×2): 2.5 mg via RESPIRATORY_TRACT
  Filled 2011-06-18 (×2): qty 0.5

## 2011-06-18 NOTE — ED Notes (Signed)
Pt work up at 0300 this morning with mid center chest tightness. Pt is febrile. Vitals - 126/70 hr 115 rr 20. Pt is allergic to codeine. Cardiac monitor is sinus tach. Pt had a pacemaker defib installed 8 weeks ago

## 2011-06-18 NOTE — ED Notes (Signed)
Received patient from [facility] zone and assumed care, pt alert, oriented x4 and verbally responsive, received patient with NS infusing with out difficulty, family at bedside

## 2011-06-18 NOTE — H&P (Signed)
I interviewed and examined this patient and discussed the care plan with Dr.Oh Park and agree with assessment and plan as documented in the admission note for today. Minimal wheezing was evident at the time of my exam.     Antonio Ray A. Sheffield Slider, MD Family Medicine Teaching Service Attending  06/18/2011 7:14 PM

## 2011-06-18 NOTE — H&P (Signed)
Chief Complaint: difficulty with short term memory, fever/chills, shortness-of-breath  HPI: This 77 year old male has a history of coronary artery disease/heart failure (EF 30-35%) and history of MI several years ago/is s/p pacemaker/defibrillator (73mo ago) for PVCs and systolic HF who presents with above symptoms.  Noticed he was "huffing-and-puffing" last night while going to the bathroom. Also started experiencing right upper quadrant abdominal pain lasting a few minutes. Also complaining of subjective fevers and chills last night on ROS. What concerned him though was when early this morning he was trying to call someone and could not remember a phone number that he should have known. He then had his neighbor call his daughter who called EMS. First responders found his O2 sats to be 86% (based on the ED note).  He is also complaining of non-productive cough that started recently.  He was having palpitations about a month ago, which he saw his Cardiologist for. Last saw Cardiologist 10/19. No changes were made at that time since patient did not want to start spironolactone at that time. He had his ICD checked then. Denies any more palpitations since seeing his Cardiologist.  He denies chest pain currently. RUQ pain now only when that area is pressed.   Past Medical History  Diagnosis Date  . GERD (gastroesophageal reflux disease)   . Coronary artery disease     History of remote anterior MI with PCI to LAD in 2006; most recent cath 2007, no intervention required  . MI, old 2007    ANTERIOR  . Chronic systolic dysfunction of left ventricle     EF 30-35%, CLASS II - III SYMPTOMS; intolerant to Coreg  . Hyperlipidemia   . PVC's (premature ventricular contractions)   . Diabetes mellitus   . DOE (dyspnea on exertion)   . ICD (implantable cardiac defibrillator) in place August 2012  . PVC (premature ventricular contraction)   . Intolerance, drug     Intolerant to beta blockers   Past Surgical  History  Procedure Date  . Cardiac catheterization 01/11/2006    DEMONSTRATES AKINESIA OF THE DISTAL ANTERIOR WALL, DISTAL INFERIOR WALL AND AKINESIA OF THE APEX. THE BASAL SEGMENTS CONTRACT WELL AND OVERALL EF 35%  . Coronary angioplasty 1994    TO THE LAD  . Foot surgery     remote  . Cholecystectomy 2/11  . Icd implant 8/12   Family History  Problem Relation Age of Onset  . Stroke Father    Social History:  Lives alone in [place]. Has family who lives about 5 houses away. Reports that he quit smoking about 18 years ago (early 44s). He does not have any smokeless tobacco history on file. Drinks beer rarely. Denies illicit drugs.  Allergies:  Allergies  Allergen Reactions  . Carvedilol Other (See Comments)    Made dizzy  . Codeine Other (See Comments)    Gets very angry, disoriented  . Levofloxacin (Levaquin) Other (See Comments)    unknown  . Morphine And Related Other (See Comments)    Gets very angry, disoriented    Medications Prior to Admission  Medication Dose Route Frequency Provider Last Rate Last Dose  . 0.9 %  sodium chloride infusion  1,000 mL Intravenous STAT Hurman Horn, MD 125 mL/hr at 06/18/11 1215 1,000 mL at 06/18/11 1215  . azithromycin (ZITHROMAX) 500 mg in dextrose 5 % 250 mL IVPB  500 mg Intravenous Once Hurman Horn, MD   500 mg at 06/18/11 1000  . cefTRIAXone (ROCEPHIN) 1 g in  dextrose 5 % 50 mL IVPB  1 g Intravenous Once Hurman Horn, MD   1 g at 06/18/11 1123  . sodium chloride 0.9 % bolus 2,000 mL  2,000 mL Intravenous Once Hurman Horn, MD   1,000 mL at 06/18/11 0942  . sodium chloride 0.9 % bolus 500 mL  500 mL Intravenous Once Hurman Horn, MD   1,000 mL at 06/18/11 1215   Medications Prior to Admission  Medication Sig Dispense Refill  . aspirin 81 MG EC tablet Take 81 mg by mouth daily.        . Cholecalciferol (VITAMIN D PO) Take by mouth daily.        . enalapril (VASOTEC) 10 MG tablet Take 5 mg by mouth 2 (two) times daily.          . fenofibrate (TRICOR) 145 MG tablet Take 1 tablet (145 mg total) by mouth daily.  30 tablet  11  . furosemide (LASIX) 20 MG tablet Take 1 tablet (20 mg total) by mouth daily.  30 tablet  5  . glimepiride (AMARYL) 2 MG tablet Take 2 mg by mouth daily before breakfast.       . nitroGLYCERIN (NITRODUR - DOSED IN MG/24 HR) 0.2 mg/hr Place 1 patch (0.2 mg total) onto the skin daily.  30 patch  5  . pravastatin (PRAVACHOL) 40 MG tablet Take 1 tablet (40 mg total) by mouth every evening.  30 tablet  5  . DISCONTD: enalapril (VASOTEC) 10 MG tablet Take 0.5 tablets (5 mg total) by mouth 2 (two) times daily.  60 tablet  5    Results for orders placed during the hospital encounter of 06/18/11 (from the past 48 hour(s))  TROPONIN I     Status: Normal   Collection Time   06/18/11  9:34 AM      Component Value Range Comment   Troponin I <0.30  <0.30 (ng/mL)   CBC     Status: Abnormal   Collection Time   06/18/11  9:34 AM      Component Value Range Comment   WBC 18.8 (*) 4.0 - 10.5 (K/uL)    RBC 4.62  4.22 - 5.81 (MIL/uL)    Hemoglobin 13.9  13.0 - 17.0 (g/dL)    HCT 11.9  14.7 - 82.9 (%)    MCV 92.0  78.0 - 100.0 (fL)    MCH 30.1  26.0 - 34.0 (pg)    MCHC 32.7  30.0 - 36.0 (g/dL)    RDW 56.2  13.0 - 86.5 (%)    Platelets 182  150 - 400 (K/uL)   DIFFERENTIAL     Status: Abnormal   Collection Time   06/18/11  9:34 AM      Component Value Range Comment   Neutrophils Relative 91 (*) 43 - 77 (%)    Neutro Abs 17.0 (*) 1.7 - 7.7 (K/uL)    Lymphocytes Relative 4 (*) 12 - 46 (%)    Lymphs Abs 0.7  0.7 - 4.0 (K/uL)    Monocytes Relative 5  3 - 12 (%)    Monocytes Absolute 1.0  0.1 - 1.0 (K/uL)    Eosinophils Relative 1  0 - 5 (%)    Eosinophils Absolute 0.1  0.0 - 0.7 (K/uL)    Basophils Relative 0  0 - 1 (%)    Basophils Absolute 0.0  0.0 - 0.1 (K/uL)   COMPREHENSIVE METABOLIC PANEL     Status: Abnormal   Collection Time  06/18/11  9:34 AM      Component Value Range Comment   Sodium 137   135 - 145 (mEq/L)    Potassium 4.0  3.5 - 5.1 (mEq/L)    Chloride 103  96 - 112 (mEq/L)    CO2 24  19 - 32 (mEq/L)    Glucose, Bld 204 (*) 70 - 99 (mg/dL)    BUN 35 (*) 6 - 23 (mg/dL)    Creatinine, Ser 1.61  0.50 - 1.35 (mg/dL)    Calcium 9.4  8.4 - 10.5 (mg/dL)    Total Protein 6.8  6.0 - 8.3 (g/dL)    Albumin 3.2 (*) 3.5 - 5.2 (g/dL)    AST 26  0 - 37 (U/L)    ALT 30  0 - 53 (U/L)    Alkaline Phosphatase 31 (*) 39 - 117 (U/L)    Total Bilirubin 0.5  0.3 - 1.2 (mg/dL)    GFR calc non Af Amer 52 (*) >90 (mL/min)    GFR calc Af Amer 60 (*) >90 (mL/min)   PROTIME-INR     Status: Normal   Collection Time   06/18/11  9:34 AM      Component Value Range Comment   Prothrombin Time 15.1  11.6 - 15.2 (seconds)    INR 1.17  0.00 - 1.49    PRO B NATRIURETIC PEPTIDE     Status: Abnormal   Collection Time   06/18/11  9:51 AM      Component Value Range Comment   BNP, POC 1019.0 (*) 0 - 450 (pg/mL)   LACTIC ACID, PLASMA     Status: Normal   Collection Time   06/18/11  9:52 AM      Component Value Range Comment   Lactic Acid, Venous 1.7  0.5 - 2.2 (mmol/L)   PROCALCITONIN     Status: Normal   Collection Time   06/18/11  9:52 AM      Component Value Range Comment   Procalcitonin 0.45     URINALYSIS, ROUTINE W REFLEX MICROSCOPIC     Status: Abnormal   Collection Time   06/18/11 11:19 AM      Component Value Range Comment   Color, Urine YELLOW  YELLOW     Appearance CLEAR  CLEAR     Specific Gravity, Urine 1.024  1.005 - 1.030     pH 5.0  5.0 - 8.0     Glucose, UA 500 (*) NEGATIVE (mg/dL)    Hgb urine dipstick NEGATIVE  NEGATIVE     Bilirubin Urine NEGATIVE  NEGATIVE     Ketones, ur NEGATIVE  NEGATIVE (mg/dL)    Protein, ur NEGATIVE  NEGATIVE (mg/dL)    Urobilinogen, UA 1.0  0.0 - 1.0 (mg/dL)    Nitrite NEGATIVE  NEGATIVE     Leukocytes, UA NEGATIVE  NEGATIVE  MICROSCOPIC NOT DONE ON URINES WITH NEGATIVE PROTEIN, BLOOD, LEUKOCYTES, NITRITE, OR GLUCOSE <1000 mg/dL.   Dg Chest 2  View  06/18/2011  *RADIOLOGY REPORT*  Clinical Data: Fever, chest pain, cough  CHEST - 2 VIEW  Comparison: 03/31/2011  Findings: Dual lead left-sided pacer noted.  Heart size at upper limits of normal.  Mildly prominent interstitial markings are noted which are nonspecific.  The superimposed patchy right upper lobe airspace opacities noted.  No pleural effusion.  No acute osseous finding.  Flattening of hemidiaphragms suggests COPD.  IMPRESSION: Ill-defined patchy right upper lobe airspace disease which could represent pneumonia in the setting of fever and cough.  Prominent interstitial markings are nonspecific.  Hyperinflation suggestive of COPD.  Original Report Authenticated By: Harrel Lemon, M.D.   ROS Denies sore throat, difficulty hearing. Denies confusion now.  Denies dysuria, hematuria, urinary urgency/frequency. Denies constipation, diarrhea.   Blood pressure 110/49, pulse 95, temperature 101.9 F (38.8 C), temperature source Oral, resp. rate 25, SpO2 96.00%.  Physical Exam  Gen: NAD Psych: not anxious or depressed appearing; pleasant Neuro: alert and oriented x4; no focal deficits HEENT:   Ears: TM clear   Nose: no rhinorrhea   Oropharynx: no erythema or tonsillar adenopathy; MMM   Neck: no LAD CV: diminished heart sounds, RRR, 2+ radial and pedal pulses Pulm: supraclavicular retractions, wheezing audible, poor air movement, mild end expiratory wheezes throughout without rales or ronchi Abd: obese, NABS, soft, mild TTP RUQ Ext: no edema; 2-3 sec capillary refill  Assessment/Plan This 77 year old male has a history of coronary artery disease/heart failure (EF 30-35%) and history of MI several years ago/is s/p pacemaker/defibrillator (42mo ago) for PVCs and systolic HF who presents with fever/chills/hypoxia x 1d.  Fever/chills/hypoxia Likely due to community-acquired versus healthcare-associated pneumonia (was at Cardiologist's office recently). CXR shows ill-defined RUL  infiltrate, WBC is elevated, patient is febrile, and had decreased O2 saturation on RA (not on O2 at home.  COPD exacerbation is also likely with his diffuse wheezing and poor air movement and history of COPD.   -Will admit to floor bed on telemetry and treat for pneumonia.  -Started on CTX and azithromycin. Will continue. -Duonebs q4 scheduled through the day today then prn overnight.  -Will start steroids.  RUQ pain Patient's gallbladder was removed several months ago; he was diagnosed with pancreatitis from gallstones at that time.  -Will check CMET, lipase.   Dehydration Received 2.5 L fluids in the ED. No signs of fluid overload at this time and not hypertensive.  -Will decrease fluids to half-maintenance.  Cardiac: history of CAD, chronic systolic CHF (EF 19-14%), s/p recent ICD placement -Will continue home cardiac medication once Med Rec done  T2DM, non-insulin dependent -SSI  GI: GERD -Protonix 40mg  PO qd -Regular carbohydrate modified diet  PPx: -SCD   Disposition: Pending clinical improvement.   Code: FULL   [place] PARK, Tabbatha Bordelon 06/18/2011, 1:10 PM

## 2011-06-18 NOTE — ED Provider Notes (Signed)
History     CSN: [id number] Arrival date & time: 06/18/2011  8:47 AM   First MD Initiated Contact with Patient 06/18/11 [phone]      Chief Complaint  Patient presents with  . Chest Pain    (Consider location/radiation/quality/duration/timing/severity/associated sxs/prior treatment) HPI This 77 year old male has a history of coronary artery disease, heart failure, and about 2 months ago had a pacemaker defibrillator placed, since that time he has had general weakness with daily lightheadedness with multiple medication adjustments from his cardiologist Dr. Cathren Laine office, the patient at baseline is stable exertional angina which is unchanged recently, today he woke up with a fever, mild cough, mild shortness of breath, mild hypoxemia with a pulse oximetry of 86% according to first responders, his pulse oximetry improved to the 90s with nasal cannula oxygen, he also woke up with a different than his usual angina type burning and sour taste in the back of his throat unlike his usual left lower chest aching from his angina. His symptoms have been constant for the last couple hours this morning. He has no redness over his pacemaker insertion site. He states he was mildly confused at home prior to arrival and could not remember a phone number that he should have known. He is currently oriented x3.  He has no headache, stiff neck, rash, vomiting, diarrhea or other concerns. Past Medical History  Diagnosis Date  . GERD (gastroesophageal reflux disease)   . Coronary artery disease     History of remote anterior MI with PCI to LAD in 2006; most recent cath 2007, no intervention required  . MI, old 2007    ANTERIOR  . Chronic systolic dysfunction of left ventricle     EF 30-35%, CLASS II - III SYMPTOMS; intolerant to Coreg  . Hyperlipidemia   . PVC's (premature ventricular contractions)   . Diabetes mellitus   . DOE (dyspnea on exertion)   . ICD (implantable cardiac defibrillator) in place  August 2012  . Intolerance, drug     Intolerant to beta blockers    Past Surgical History  Procedure Date  . Cardiac catheterization 01/11/2006    DEMONSTRATES AKINESIA OF THE DISTAL ANTERIOR WALL, DISTAL INFERIOR WALL AND AKINESIA OF THE APEX. THE BASAL SEGMENTS CONTRACT WELL AND OVERALL EF 35%  . Coronary angioplasty 1994    TO THE LAD  . Foot surgery     remote  . Cholecystectomy 2/11  . Icd implant 8/12    Family History  Problem Relation Age of Onset  . Stroke Father     History  Substance Use Topics  . Smoking status: Former Smoker    Quit date: 08/01/1992  . Smokeless tobacco: Not on file  . Alcohol Use: No      Review of Systems  Constitutional: Positive for fever and fatigue.       10 Systems reviewed and are negative for acute change except as noted in the HPI.  HENT: Negative for rhinorrhea.   Eyes: Negative for discharge and redness.  Respiratory: Positive for cough and shortness of breath.   Cardiovascular: Positive for chest pain.  Gastrointestinal: Negative for vomiting, abdominal pain and diarrhea.  Genitourinary: Negative for dysuria.  Musculoskeletal: Negative for back pain.  Skin: Negative for rash.  Neurological: Positive for dizziness, weakness and light-headedness. Negative for syncope, speech difficulty, numbness and headaches.  Psychiatric/Behavioral: Positive for confusion. Negative for suicidal ideas and hallucinations.    Allergies  Carvedilol; Codeine; Levofloxacin; and Morphine and related  Home  Medications   No current outpatient prescriptions on file.  BP 115/45  Pulse 67  Temp(Src) 99 F (37.2 C) (Oral)  Resp 28  Ht 5\' 10"  (1.778 m)  Wt 205 lb 6 oz (93.157 kg)  BMI 29.47 kg/m2  SpO2 97%  Physical Exam  Nursing note and vitals reviewed. Constitutional: He is oriented to person, place, and time.       Awake, alert, nontoxic appearance.  HENT:  Head: Atraumatic.       Oropharynx is clear, mucous membranes somewhat dry    Eyes: Conjunctivae are normal. Pupils are equal, round, and reactive to light. Right eye exhibits no discharge. Left eye exhibits no discharge.  Neck: Normal range of motion. Neck supple.  Cardiovascular: Normal rate and regular rhythm.   No murmur heard. Pulmonary/Chest: Effort normal. No respiratory distress. He has no wheezes. He has rales. He exhibits no tenderness.       Rales at the right base; pulse oximetry is in the mid 90s on nasal cannula oxygen and the patient is able to speak full sentences at rest  Abdominal: Soft. Bowel sounds are normal. There is no tenderness. There is no rebound.  Musculoskeletal: Normal range of motion. He exhibits no edema and no tenderness.       Baseline ROM, no obvious new focal weakness.  Neurological: He is alert and oriented to person, place, and time.       Mental status and motor strength appears baseline for patient and situation.  Skin: No rash noted.  Psychiatric: He has a normal mood and affect.    ED Course  Procedures (including critical care time) ECG sinus rhythm, ventricular premature complex, ventricular rate 97 beats per minute, left axis deviation, inferior Q waves, nonspecific T wave changes, no acute ST segment changes noted, no significant change from August 2012.  Patient's cardiologist is Dr. [place], the patient's primary care doctor is at [facility] urgent care.  Patient / Family / Caregiver informed of clinical course, understand medical decision-making process, and agree with plan.  discussed with family medicine for admission.  5:43 PM Labs Reviewed  CBC - Abnormal; Notable for the following:    WBC 18.8 (*)    All other components within normal limits  DIFFERENTIAL - Abnormal; Notable for the following:    Neutrophils Relative 91 (*)    Neutro Abs 17.0 (*)    Lymphocytes Relative 4 (*)    All other components within normal limits  COMPREHENSIVE METABOLIC PANEL - Abnormal; Notable for the following:    Glucose, Bld 204  (*)    BUN 35 (*)    Albumin 3.2 (*)    Alkaline Phosphatase 31 (*)    GFR calc non Af Amer 52 (*)    GFR calc Af Amer 60 (*)    All other components within normal limits  PRO B NATRIURETIC PEPTIDE - Abnormal; Notable for the following:    BNP, POC 1019.0 (*)    All other components within normal limits  URINALYSIS, ROUTINE W REFLEX MICROSCOPIC - Abnormal; Notable for the following:    Glucose, UA 500 (*)    All other components within normal limits  HEPATIC FUNCTION PANEL - Abnormal; Notable for the following:    Albumin 3.1 (*)    Alkaline Phosphatase 32 (*)    All other components within normal limits  GLUCOSE, CAPILLARY - Abnormal; Notable for the following:    Glucose-Capillary 133 (*)    All other components within normal limits  TROPONIN I  PROTIME-INR  LACTIC ACID, PLASMA  PROCALCITONIN  CULTURE, BLOOD (ROUTINE X 2)  URINE CULTURE  CULTURE, BLOOD (ROUTINE X 2)  BASIC METABOLIC PANEL  CBC   Dg Chest 2 View  06/18/2011  *RADIOLOGY REPORT*  Clinical Data: Fever, chest pain, cough  CHEST - 2 VIEW  Comparison: 03/31/2011  Findings: Dual lead left-sided pacer noted.  Heart size at upper limits of normal.  Mildly prominent interstitial markings are noted which are nonspecific.  The superimposed patchy right upper lobe airspace opacities noted.  No pleural effusion.  No acute osseous finding.  Flattening of hemidiaphragms suggests COPD.  IMPRESSION: Ill-defined patchy right upper lobe airspace disease which could represent pneumonia in the setting of fever and cough.  Prominent interstitial markings are nonspecific.  Hyperinflation suggestive of COPD.  Original Report Authenticated By: Harrel Lemon, M.D.     1. CAP (community acquired pneumonia)   2. Hypoxia   3. Sepsis   4. Hypercholesteremia   4. Transient AMS. 5. Hyperglycemia    MDM  CRITICAL CARE Performed by: Hurman Horn   Total critical care time: .  Critical care time was exclusive of separately  billable procedures and treating other patients.  Critical care was necessary to treat or prevent imminent or life-threatening deterioration.  Critical care was time spent personally by me on the following activities: development of treatment plan with patient and/or surrogate as well as nursing, discussions with consultants, evaluation of patient's response to treatment, examination of patient, obtaining history from patient or surrogate, ordering and performing treatments and interventions, ordering and review of laboratory studies, ordering and review of radiographic studies, pulse oximetry and re-evaluation of patient's condition.        Hurman Horn, MD 06/18/11 [phone]

## 2011-06-19 LAB — BASIC METABOLIC PANEL
Calcium: 9.1 mg/dL (ref 8.4–10.5)
Calcium: 8.8 mg/dL (ref 8.4–10.5)
Creatinine, Ser: 1.24 mg/dL (ref 0.50–1.35)
GFR calc Af Amer: 63 mL/min — ABNORMAL LOW (ref >90)
GFR calc non Af Amer: 54 mL/min — ABNORMAL LOW (ref >90)
GFR calc non Af Amer: 55 mL/min — ABNORMAL LOW (ref >90)
Glucose, Bld: 230 mg/dL — ABNORMAL HIGH (ref 70–99)
Potassium: 4.2 mEq/L (ref 3.5–5.1)
Sodium: 140 mEq/L (ref 135–145)
Sodium: 139 mEq/L (ref 135–145)

## 2011-06-19 LAB — URINE CULTURE
Colony Count: NO GROWTH
Culture: NO GROWTH

## 2011-06-19 LAB — CBC
Hemoglobin: 12.7 g/dL — ABNORMAL LOW (ref 13.0–17.0)
MCH: 30.5 pg (ref 26.0–34.0)
MCV: 93.3 fL (ref 78.0–100.0)
Platelets: 180 10*3/uL (ref 150–400)
Platelets: 162 10*3/uL (ref 150–400)
RBC: 4.28 MIL/uL (ref 4.22–5.81)
RBC: 4.06 MIL/uL — ABNORMAL LOW (ref 4.22–5.81)
RDW: 13.7 % (ref 11.5–15.5)

## 2011-06-19 LAB — GLUCOSE, CAPILLARY: Glucose-Capillary: 252 mg/dL — ABNORMAL HIGH (ref 70–99)

## 2011-06-19 LAB — HEMOGLOBIN A1C: Hgb A1c MFr Bld: 6.6 % — ABNORMAL HIGH (ref <5.7)

## 2011-06-19 MED ORDER — INSULIN ASPART 100 UNIT/ML ~~LOC~~ SOLN
0.0000 [IU] | Freq: Three times a day (TID) | SUBCUTANEOUS | Status: DC
  Administered 2011-06-19 – 2011-06-20 (×2): 5 [IU] via SUBCUTANEOUS
  Administered 2011-06-20: 2 [IU] via SUBCUTANEOUS
  Filled 2011-06-19: qty 3

## 2011-06-19 MED ORDER — DEXTROSE 5 % IV SOLN
500.0000 mg | INTRAVENOUS | Status: DC
  Administered 2011-06-19 – 2011-06-20 (×2): 500 mg via INTRAVENOUS
  Filled 2011-06-19 (×2): qty 500

## 2011-06-19 MED ORDER — DEXTROSE 5 % IV SOLN
1.0000 g | INTRAVENOUS | Status: DC
  Administered 2011-06-19 – 2011-06-20 (×2): 1 g via INTRAVENOUS
  Filled 2011-06-19 (×2): qty 10

## 2011-06-19 MED ORDER — INSULIN ASPART 100 UNIT/ML ~~LOC~~ SOLN
0.0000 [IU] | Freq: Every day | SUBCUTANEOUS | Status: DC
  Administered 2011-06-19: 2 [IU] via SUBCUTANEOUS

## 2011-06-19 MED ORDER — GLIMEPIRIDE 2 MG PO TABS
2.0000 mg | ORAL_TABLET | Freq: Every day | ORAL | Status: DC
  Administered 2011-06-20: 2 mg via ORAL
  Filled 2011-06-19 (×2): qty 1

## 2011-06-19 MED ORDER — DEXTROSE 5 % IV SOLN: 250.0000 mg | INTRAVENOUS | Status: DC

## 2011-06-19 NOTE — Progress Notes (Signed)
Subjective: Good night's sleep last night.  Feels about the same.   Objective: Vital signs in last 24 hours: Temp:  [97.1 F (36.2 C)-99 F (37.2 C)] 97.1 F (36.2 C) (11/18 0506) Pulse Rate:  [59-74] 62  (11/18 0506) Resp:  [18-28] 18  (11/18 0506) BP: (94-135)/(43-67) 103/43 mmHg (11/18 0506) SpO2:  [90 %-99 %] 94 % (11/18 0850) Weight:  [203 lb 4.8 oz (92.216 kg)-205 lb 6 oz (93.157 kg)] 203 lb 4.8 oz (92.216 kg) (11/18 0506) Weight change:  Last BM Date: 06/18/11  Intake/Output from previous day: 11/17 0701 - 11/18 0700 In: 700 [I.V.:700] Out: 851 [Urine:850; Stool:1] Intake/Output this shift:   Physical Exam  Gen: NAD  Psych: not anxious or depressed appearing; pleasant  Neuro: alert and oriented x4; no focal deficits  CV: diminished heart sounds, RRR, 2+ radial and pedal pulses  Pulm: no increased WOB, poor air movement, no wheezing, coarse breath sounds lower bases most prominently on right side Abd: obese, NABS, soft, NT Ext: no edema  Lab Results:  Basename 06/19/11 0700 06/18/11 0934  WBC 20.2* 18.8*  HGB 12.7* 13.9  HCT 40.0 42.5  PLT 180 182   BMET  Basename 06/19/11 0700 06/18/11 0934  NA 140 137  K 4.2 4.0  CL 108 103  CO2 24 24  GLUCOSE 167* 204*  BUN 27* 35*  CREATININE 1.26 1.30  CALCIUM 9.1 9.4    Studies/Results: Dg Chest 2 View  06/18/2011  *RADIOLOGY REPORT*  Clinical Data: Fever, chest pain, cough  CHEST - 2 VIEW  Comparison: 03/31/2011  Findings: Dual lead left-sided pacer noted.  Heart size at upper limits of normal.  Mildly prominent interstitial markings are noted which are nonspecific.  The superimposed patchy right upper lobe airspace opacities noted.  No pleural effusion.  No acute osseous finding.  Flattening of hemidiaphragms suggests COPD.  IMPRESSION: Ill-defined patchy right upper lobe airspace disease which could represent pneumonia in the setting of fever and cough.  Prominent interstitial markings are nonspecific.   Hyperinflation suggestive of COPD.  Original Report Authenticated By: Harrel Lemon, M.D.    Medications:  Prior to Admission:  Prescriptions prior to admission  Medication Sig Dispense Refill  . aspirin 81 MG EC tablet Take 81 mg by mouth daily.        . Cholecalciferol (VITAMIN D PO) Take by mouth daily.        . enalapril (VASOTEC) 10 MG tablet Take 5 mg by mouth 2 (two) times daily.        . fenofibrate (TRICOR) 145 MG tablet Take 1 tablet (145 mg total) by mouth daily.  30 tablet  11  . furosemide (LASIX) 20 MG tablet Take 1 tablet (20 mg total) by mouth daily.  30 tablet  5  . glimepiride (AMARYL) 2 MG tablet Take 2 mg by mouth daily before breakfast.       . nitroGLYCERIN (NITRODUR - DOSED IN MG/24 HR) 0.2 mg/hr Place 1 patch (0.2 mg total) onto the skin daily.  30 patch  5  . pravastatin (PRAVACHOL) 40 MG tablet Take 1 tablet (40 mg total) by mouth every evening.  30 tablet  5   Scheduled:   . sodium chloride  1,000 mL Intravenous STAT  . ipratropium  0.5 mg Nebulization TID   And  . albuterol  2.5 mg Nebulization TID  . aspirin EC  81 mg Oral Daily  . azithromycin  500 mg Intravenous Once  . cefTRIAXone (ROCEPHIN) IVPB  1 gram/50 mL D5W  1 g Intravenous Once  . cholecalciferol  1,000 Units Oral Daily  . enalapril  5 mg Oral BID  . fenofibrate  160 mg Oral Daily  . nitroGLYCERIN  0.2 mg Transdermal Daily  . pantoprazole  40 mg Oral Q1200  . predniSONE  60 mg Oral Daily  . simvastatin  20 mg Oral q1800  . sodium chloride  2,000 mL Intravenous Once  . sodium chloride  500 mL Intravenous Once  . DISCONTD: albuterol  2.5 mg Nebulization Q4H  . DISCONTD: albuterol  2.5 mg Nebulization TID  . DISCONTD: ipratropium  0.5 mg Nebulization Q4H  . DISCONTD: ipratropium  0.5 mg Nebulization Q4H   Continuous:   . sodium chloride 50 mL/hr at 06/18/11 1844   ZOX:[id number], acetaminophen, ondansetron (ZOFRAN) IV, ondansetron  Assessment/Plan: This 77 year old male has a  history of coronary artery disease/heart failure (EF 30-35%) and history of MI several years ago/is s/p pacemaker/defibrillator (62mo ago) for PVCs and systolic HF who presents with fever/chills/hypoxia x 1d.   Fever/chills/hypoxia 2/2 CAP/HCAP +/- COPD exacerbation.  No longer febrile. Appears better and lungs sound better although still with decreased air movement. WBC elevated but likely due to starting steroids.   History: Likely due to community-acquired versus healthcare-associated pneumonia (was at Cardiologist's office recently). CXR shows ill-defined RUL infiltrate, WBC is elevated, patient is febrile, and had decreased O2 saturation on RA (not on O2 at home.  COPD exacerbation is also likely with his diffuse wheezing and poor air movement and history of COPD.   -Continue CTX and azithromycin.  -Duonebs q4 prn -Continue steroids x5d. -Will write for PT/OT and for O2 saturations while ambulating.   RUQ pain Resolved.   History: Patient's gallbladder was removed several months ago; he was diagnosed with pancreatitis from gallstones at that time. LFTS WNL.  FEN -Will D/C IVF  Cardiac: history of CAD, chronic systolic CHF (EF 14-78%), s/p recent ICD placement  -Continue home ASA 81, enalapril 5, statin (patient intolerant of beta-blockers)   T2DM, non-insulin dependent  -Will re-start home glimepiride -SSI   GI: GERD  -Protonix 40mg  PO qd  -Regular carbohydrate modified diet   PPx:  -SCD   Disposition: Pending clinical improvement. Would like to monitor through today, consult PT, get O2 saturations while ambulating, have him sitting up. Likely discharge tomorrow with or without O2. Patient has history of chronic DOE.  Code: FULL    LOS: 1 day   [place] PARK, Alexiss Iturralde 06/19/2011, 9:20 AM

## 2011-06-19 NOTE — Progress Notes (Signed)
I interviewed and examined this patient and discussed the care plan with Dr. Madolyn Frieze and the [facility] team and agree with assessment and plan as documented in the progress note for today. Says he was confused about a phone number this morning.     Tyronica Truxillo A. Sheffield Slider, MD Family Medicine Teaching Service Attending  06/19/2011 12:55 PM

## 2011-06-20 ENCOUNTER — Other Ambulatory Visit: Payer: Self-pay

## 2011-06-20 DIAGNOSIS — J441 Chronic obstructive pulmonary disease with (acute) exacerbation: Secondary | ICD-10-CM | POA: Diagnosis present

## 2011-06-20 DIAGNOSIS — J189 Pneumonia, unspecified organism: Secondary | ICD-10-CM | POA: Diagnosis present

## 2011-06-20 LAB — GLUCOSE, CAPILLARY
Glucose-Capillary: 166 mg/dL — ABNORMAL HIGH (ref 70–99)
Glucose-Capillary: 109 mg/dL — ABNORMAL HIGH (ref 70–99)
Glucose-Capillary: 256 mg/dL — ABNORMAL HIGH (ref 70–99)

## 2011-06-20 MED ORDER — AZITHROMYCIN 250 MG PO TABS: ORAL_TABLET | ORAL | Status: AC

## 2011-06-20 MED ORDER — PREDNISONE 20 MG PO TABS: 60.0000 mg | ORAL_TABLET | Freq: Every day | ORAL | Status: DC

## 2011-06-20 MED ORDER — ALBUTEROL 90 MCG/ACT IN AERS: 2.0000 | INHALATION_SPRAY | RESPIRATORY_TRACT | Status: DC | PRN

## 2011-06-20 MED ORDER — CEPHALEXIN 500 MG PO CAPS: 500.0000 mg | ORAL_CAPSULE | Freq: Two times a day (BID) | ORAL | Status: DC

## 2011-06-20 NOTE — Progress Notes (Signed)
02 at  3 liters sat 94%and ra sat a92%

## 2011-06-20 NOTE — Progress Notes (Signed)
Physical Therapy Evaluation [name] MRN: [id number] DOB: 07/28/1934 Today's Date: 2011-07-14  Problem List:  Patient Active Problem List  Diagnoses  . Coronary artery disease  . Sick sinus syndrome  . Chronic systolic congestive heart failure  . Hyperlipidemia  . PVC's (premature ventricular contractions)  . Diabetes mellitus type II, non insulin dependent  . S/P ICD (internal cardiac defibrillator) procedure    Past Medical History:  Past Medical History  Diagnosis Date  . GERD (gastroesophageal reflux disease)   . Coronary artery disease     History of remote anterior MI with PCI to LAD in 2006; most recent cath 2007, no intervention required  . MI, old 2007    ANTERIOR  . Chronic systolic dysfunction of left ventricle     EF 30-35%, CLASS II - III SYMPTOMS; intolerant to Coreg  . Hyperlipidemia   . PVC's (premature ventricular contractions)   . Diabetes mellitus   . DOE (dyspnea on exertion)   . ICD (implantable cardiac defibrillator) in place August 2012  . Intolerance, drug     Intolerant to beta blockers   Past Surgical History:  Past Surgical History  Procedure Date  . Cardiac catheterization 01/11/2006    DEMONSTRATES AKINESIA OF THE DISTAL ANTERIOR WALL, DISTAL INFERIOR WALL AND AKINESIA OF THE APEX. THE BASAL SEGMENTS CONTRACT WELL AND OVERALL EF 35%  . Coronary angioplasty 1994    TO THE LAD  . Foot surgery     remote  . Cholecystectomy 2/11  . Icd implant 8/12    PT Assessment/Plan/Recommendation PT Assessment Clinical Impression Statement: Pt is 77 y.o. male admitted for SOB/CAP with decreased oxygen saturation. Currently, pt is at baseline level of mobility, however still with mild SOB withexertion. 02 sats ranged from 89-92% with gait and stairs on RA, and HR range from 74-92bpm. Discussed energy conservation at home and continuing daily checks from daughter.  PT Recommendation/Assessment: Patent does not need any further  PT services No Skilled PT: Patient at baseline level of functioning    PT Evaluation   Prior Functioning  Home Living Lives With: Alone Receives Help From: Family (Daily phone checks x3 from daughter) Type of Home: House Home Layout: Two level Alternate Level Stairs-Rails: Left Alternate Level Stairs-Number of Steps: 13 Home Access: Stairs to enter Entrance Stairs-Rails: Left Entrance Stairs-Number of Steps: 5 Home Adaptive Equipment: None Prior Function Level of Independence: Independent with basic ADLs;Independent with homemaking with ambulation;Independent with gait;Independent with transfers Able to Take Stairs?: Reciprically Driving: Yes Vocation: Full time employment Vocation Requirements: Sitting at post office Comments: Pt reports walking for ex after MI Cognition Cognition Arousal/Alertness: Awake/alert Overall Cognitive Status: Appears within functional limits for tasks assessed Orientation Level: Oriented X4   Extremity Assessment RLE Assessment RLE Assessment: Within Functional Limits LLE Assessment LLE Assessment: Within Functional Limits Mobility (including Balance) Bed Mobility Bed Mobility: Yes Supine to Sit: 6: Modified independent (Device/Increase time);With rails;HOB flat (Pt states bed too narrow to not use rails as he does at home) Transfers Transfers: Yes Sit to Stand: 6: Modified independent (Device/Increase time);With upper extremity assist;With armrests;From bed Stand to Sit: 6: Modified independent (Device/Increase time);With upper extremity assist;To chair/3-in-1;With armrests Ambulation/Gait Ambulation/Gait: Yes Ambulation/Gait Assistance: 5: Supervision Ambulation/Gait Assistance Details (indicate cue type and reason): 02 during gait decreased to 90% on room air, and increased to 92% sitting within 1 min on RA. S for pt safety during assessment off oxygen and balance challenges. Pt able to navigate busy halls with  visual distactions and  obstacles with no LOB. Pt able to perform head turns R/L an dup/down with minimal path deviations.  Ambulation Distance (Feet): 200 Feet Assistive device: None Gait Pattern: Within Functional Limits Gait velocity: WFL Stairs: Yes Stairs Assistance: 6: Modified independent (Device/Increase time) Stair Management Technique: One rail Left;Alternating pattern Number of Stairs: 13  Height of Stairs: 6       End of Session PT - End of Session Equipment Utilized During Treatment: Gait belt Activity Tolerance: Patient tolerated treatment well (Pt demo'd 2/4 SOB on dyspnea scale after stairs. ) Patient left: in chair Nurse Communication: Mobility status for ambulation (02 sats) General Behavior During Session: [facility] for tasks performed Cognition: [facility] for tasks performed  [facility] 06/16/2011, 9:15 AM  Clydene Laming, PT [phone]

## 2011-06-20 NOTE — Progress Notes (Signed)
Subjective: Patient not using O2 this AM; ambulating and walking up stairs, notes mild dizziness after climbing a flight of stairs  - used Duoned at  7:40 AM   Objective: Vital signs in last 24 hours: Temp:  [97.7 F (36.5 C)-98.7 F (37.1 C)] 97.7 F (36.5 C) (11/19 0426) Pulse Rate:  [60-94] 94  (11/19 0850) Resp:  [16-18] 18  (11/19 0426) BP: (94-120)/(47-62) 94/47 mmHg (11/19 0426) SpO2:  [89 %-97 %] 89 % (11/19 0850) Weight:  [206 lb (93.441 kg)] 206 lb (93.441 kg) (11/19 0426) Weight change: 10 oz (0.283 kg) Last BM Date: 06/18/11  Intake/Output from previous day: 11/18 0701 - 11/19 0700 In: 600 [P.O.:600] Out: 700 [Urine:700] Intake/Output this shift: Total I/O In: 720 [P.O.:720] Out: -   General appearance: alert, cooperative, appears stated age and no distress Resp: clear to auscultation bilaterally and decreased air movement in all lung fields Cardio: regular rate and rhythm GI: obese, normactive bowel souds, mild tenderness to palpation in RU Extremities: extremities normal, atraumatic, no cyanosis or edema  Lab Results:  Basename 06/19/11 1000 06/19/11 0700  WBC 20.5* 20.2*  HGB 12.4* 12.7*  HCT 37.9* 40.0  PLT 162 180   BMET  Basename 06/19/11 1000 06/19/11 0700  NA 139 140  K 4.2 4.2  CL 108 108  CO2 23 24  GLUCOSE 230* 167*  BUN 25* 27*  CREATININE 1.24 1.26  CALCIUM 8.8 9.1    Studies/Results: Dg Chest 2 View  06/18/2011  *RADIOLOGY REPORT*  Clinical Data: Fever, chest pain, cough  CHEST - 2 VIEW  Comparison: 03/31/2011  Findings: Dual lead left-sided pacer noted.  Heart size at upper limits of normal.  Mildly prominent interstitial markings are noted which are nonspecific.  The superimposed patchy right upper lobe airspace opacities noted.  No pleural effusion.  No acute osseous finding.  Flattening of hemidiaphragms suggests COPD.  IMPRESSION: Ill-defined patchy right upper lobe airspace disease which could represent pneumonia in the setting  of fever and cough.  Prominent interstitial markings are nonspecific.  Hyperinflation suggestive of COPD.  Original Report Authenticated By: Harrel Lemon, M.D.    Medications:  I have reviewed the patient's current medications. Scheduled:   . ipratropium  0.5 mg Nebulization TID   And  . albuterol  2.5 mg Nebulization TID  . aspirin EC  81 mg Oral Daily  . azithromycin  500 mg Intravenous Q24H  . cefTRIAXone (ROCEPHIN) IV  1 g Intravenous Q24H  . cholecalciferol  1,000 Units Oral Daily  . enalapril  5 mg Oral BID  . fenofibrate  160 mg Oral Daily  . glimepiride  2 mg Oral Q breakfast  . insulin aspart  0-5 Units Subcutaneous QHS  . insulin aspart  0-9 Units Subcutaneous TID WC  . nitroGLYCERIN  0.2 mg Transdermal Daily  . pantoprazole  40 mg Oral Q1200  . predniSONE  60 mg Oral Daily  . simvastatin  20 mg Oral q1800  . DISCONTD: azithromycin  250 mg Intravenous Q24H   ZOX:WRUEAVWUJWJXB, acetaminophen, ondansetron (ZOFRAN) IV, ondansetron  Assessment/Plan: 77 year old man with hypoxia and likely CAP and COPD exacerbation and a history of systolic heart failure (EF 30-35%)  1. Pulmonary - O2 sats decreased to 89% with ambulation, improved with O2   A. Being treated for CAP with CTX and azithromycin, switch to Keflex for total of 10 days; remains afebrile  B. COPD Exacerbation - continue 5 days steroid burst, alubuterol  & ipratropium  2.  CV - home meds ASA, nitro, enalapril, statin, fenofibrate  3. Endocrine - Type 2 DM - cont home glimepiride, SSI  4. FENGI: carb modified diet, 5. PPX: pantoprazole 6. Dispo: d/c home 07/01/2011 if tolerating no O2.    LOS: 2 days   Mat Carne 06/15/2011, 10:10 AM

## 2011-06-20 NOTE — Discharge Summary (Signed)
Physician Discharge Summary  Patient ID: Antonio Ray MRN: [id number] DOB/AGE: Jan 26, 1934 77 y.o.  Admit date: 06/18/2011 Discharge date: 20-Jul-2011  Admission Diagnoses: Community Acquired Pneumonia; COPD Exacerbation   Discharge Diagnoses:  Patient Active Problem List  Diagnoses . Community acquired pneumonia . COPD exacerbation      . Coronary artery disease  . Sick sinus syndrome  . Chronic systolic congestive heart failure  . Hyperlipidemia  . PVC's (premature ventricular contractions)  . Diabetes mellitus type II, non insulin dependent  . S/P ICD (internal cardiac defibrillator) procedure           Discharged Condition: good  Hospital Course: Mr. Sluka presented on 11/17 with a recent history of dyspnea on exertion, fever, and hypoxia. Given his cardiac history of remote MI, CAD, a tropnin was checked and it was <0.30. He was started on Ceftriaxone and azithromycin for community acquired pneumonia and prednisone and duonebs for a potential COPD exacerbation. He was also given oxygen via nasal cannula for dyspnea and hypoxia. He was maintained on his home medications for diabetes and cardiac disease. The patient was afebrile on hospital day 2. On hospital day 3 the patient was evaluated by PT for ambulation and his O2 saturation remained greater than or equal to 89%. The patient was back to baseline functional level and appropriate for discharge.   Consults: none  Significant Diagnostic Studies:  Lactic Acid - 1.7 Troponin I < 0.30 Albumin 3.1 WBC: 18.8  Pro-BNP 1019.0  CHEST - 2 VIEW IMPRESSION:  Ill-defined patchy right upper lobe airspace disease which could  represent pneumonia in the setting of fever and cough. Prominent  interstitial markings are nonspecific.  Hyperinflation suggestive of COPD.  Treatments: IV hydration, antibiotics: ceftriaxone and azithromycin, cardiac meds: enalapril (Vasotec),  and furosemide and steroids: prednisone  Discharge  Exam: Blood pressure 105/57, pulse 65, temperature 97.7 F (36.5 C), temperature source Oral, resp. rate 18, height 5\' 10"  (1.778 m), weight 206 lb (93.441 kg), SpO2 92.00%. General appearance: alert, cooperative, appears stated age and no distress  Resp: clear to auscultation bilaterally and decreased air movement in all lung fields  Cardio: regular rate and rhythm  GI: obese, normactive bowel souds, mild tenderness to palpation in RU  Extremities: extremities normal, atraumatic, no cyanosis or edema   Disposition: Home or Self Care  Discharge Orders    Future Appointments: Provider: Department: Dept Phone: Center:   06/30/2011 11:00 AM Gardiner Rhyme, MD [facility] [phone] [facility]   06/30/2011 11:15 AM [facility]  None     Future Orders Please Complete By Expires   Diet - low sodium heart healthy      Increase activity slowly      Discharge instructions      Comments:   Please continue antibiotics as prescribed. The albuterol inhaler is in case you have shortness of breath that does not resolve. If you need this medication more than 2 times in one day, then you need to call your doctor. You regular physician can decide whether or not to keep you on this medication.     Current Discharge Medication List    START taking these medications   Details  albuterol (PROVENTIL,VENTOLIN) 90 MCG/ACT inhaler Inhale 2 puffs into the lungs every 4 (four) hours as needed for shortness of breath. Qty: 17 g, Refills: 0    azithromycin (ZITHROMAX) 250 MG tablet One tablet daily for 2 days Qty: 2 tablet, Refills: 0  cephALEXin (KEFLEX) 500 MG capsule Take 1 capsule (500 mg total) by mouth 2 (two) times daily. Qty: 14 capsule, Refills: 0    predniSONE (DELTASONE) 20 MG tablet Take 3 tablets (60 mg total) by mouth daily. Qty: 3 tablet, Refills: 0      CONTINUE these medications which have NOT CHANGED   Details  aspirin 81 MG EC tablet  Take 81 mg by mouth daily.      Cholecalciferol (VITAMIN D PO) Take by mouth daily.      enalapril (VASOTEC) 10 MG tablet Take 5 mg by mouth 2 (two) times daily.      fenofibrate (TRICOR) 145 MG tablet Take 1 tablet (145 mg total) by mouth daily. Qty: 30 tablet, Refills: 11   Associated Diagnoses: Hypercholesteremia    furosemide (LASIX) 20 MG tablet Take 1 tablet (20 mg total) by mouth daily. Qty: 30 tablet, Refills: 5    glimepiride (AMARYL) 2 MG tablet Take 2 mg by mouth daily before breakfast.     nitroGLYCERIN (NITRODUR - DOSED IN MG/24 HR) 0.2 mg/hr Place 1 patch (0.2 mg total) onto the skin daily. Qty: 30 patch, Refills: 5    pravastatin (PRAVACHOL) 40 MG tablet Take 1 tablet (40 mg total) by mouth every evening. Qty: 30 tablet, Refills: 5   Associated Diagnoses: Hypercholesteremia       Follow-up Information    Follow up with [facility] in 3 days.   Contact information:   [address] [place] [address]          Signed: Mat Carne 06/25/2011, 6:31 PM

## 2011-06-24 LAB — CULTURE, BLOOD (ROUTINE X 2)
Culture  Setup Time: 201211171329
Culture: NO GROWTH

## 2011-06-30 ENCOUNTER — Ambulatory Visit (INDEPENDENT_AMBULATORY_CARE_PROVIDER_SITE_OTHER): Payer: Federal, State, Local not specified - PPO | Admitting: Internal Medicine

## 2011-06-30 ENCOUNTER — Encounter: Payer: Self-pay | Admitting: Internal Medicine

## 2011-06-30 VITALS — BP 108/60 | HR 64 | Ht 70.0 in | Wt 200.0 lb

## 2011-06-30 DIAGNOSIS — I5022 Chronic systolic (congestive) heart failure: Secondary | ICD-10-CM

## 2011-06-30 DIAGNOSIS — I2510 Atherosclerotic heart disease of native coronary artery without angina pectoris: Secondary | ICD-10-CM

## 2011-06-30 DIAGNOSIS — I2589 Other forms of chronic ischemic heart disease: Secondary | ICD-10-CM

## 2011-06-30 DIAGNOSIS — I509 Heart failure, unspecified: Secondary | ICD-10-CM

## 2011-06-30 DIAGNOSIS — J189 Pneumonia, unspecified organism: Secondary | ICD-10-CM

## 2011-06-30 DIAGNOSIS — I495 Sick sinus syndrome: Secondary | ICD-10-CM

## 2011-06-30 LAB — ICD DEVICE OBSERVATION
AL AMPLITUDE: 2 mv
AL IMPEDENCE ICD: 462.5 Ohm
ATRIAL PACING ICD: 57 pct
BAMS-0001: 170 {beats}/min
CHARGE TIME: 8.3 s
HV IMPEDENCE: 87 Ohm
RV LEAD AMPLITUDE: 12 mv
RV LEAD IMPEDENCE ICD: 462.5 Ohm
RV LEAD THRESHOLD: 0.5 V
TOT-0006: 20120829000000
TOT-0007: 1
TOT-0008: 0
TOT-0009: 1

## 2011-06-30 NOTE — Assessment & Plan Note (Signed)
Appears resolved

## 2011-06-30 NOTE — Assessment & Plan Note (Signed)
No ischemic symptoms EKG is stable No changes today

## 2011-06-30 NOTE — Patient Instructions (Signed)
Your physician wants you to follow-up in: 6 months with Dr Jacquiline Doe will receive a reminder letter in the mail two months in advance. If you don't receive a letter, please call our office to schedule the follow-up appointment.  Remote monitoring is used to monitor your Pacemaker of ICD from home. This monitoring reduces the number of office visits required to check your device to one time per year. It allows [place] to keep an eye on the functioning of your device to ensure it is working properly. You are scheduled for a device check from home on 09/29/11. You may send your transmission at any time that day. If you have a wireless device, the transmission will be sent automatically. After your physician reviews your transmission, you will receive a postcard with your next transmission date.   HOLD Furosemide until systolic blood pressure is above 100 and our weight is back up to 205  After weight is up to 205 then you will need to take the Furosemide if your weight goes up by 2+ pounds

## 2011-06-30 NOTE — Progress Notes (Signed)
PCP:  [name]., MD  The patient presents today for routine electrophysiology followup.  Since having his ICD implanted, the patient reports doing reasonably well.  He remains active.  He has been diligently following his BP and HR at home.   He was hospitalized several weeks ago with pneumonia.  This has resolved. He has frequent blood pressures that are low with mild dizziness.  He is presently several pounds below his dry weight. Today, he denies symptoms of palpitations, chest pain, shortness of breath, orthopnea, PND, lower extremity edema, dizziness, presyncope, syncope, or neurologic sequela.  The patient feels that he is tolerating medications without difficulties and is otherwise without complaint today.   Past Medical History  Diagnosis Date  . GERD (gastroesophageal reflux disease)   . Coronary artery disease     History of remote anterior MI with PCI to LAD in 2006; most recent cath 2007, no intervention required  . MI, old 2007    ANTERIOR  . Chronic systolic dysfunction of left ventricle     EF 30-35%, CLASS II - III SYMPTOMS; intolerant to Coreg  . Hyperlipidemia   . PVC's (premature ventricular contractions)   . Diabetes mellitus   . DOE (dyspnea on exertion)   . ICD (implantable cardiac defibrillator) in place August 2012  . Intolerance, drug     Intolerant to beta blockers   Past Surgical History  Procedure Date  . Cardiac catheterization 01/11/2006    DEMONSTRATES AKINESIA OF THE DISTAL ANTERIOR WALL, DISTAL INFERIOR WALL AND AKINESIA OF THE APEX. THE BASAL SEGMENTS CONTRACT WELL AND OVERALL EF 35%  . Coronary angioplasty 1994    TO THE LAD  . Foot surgery     remote  . Cholecystectomy 2/11  . Icd implant 8/12    Current Outpatient Prescriptions  Medication Sig Dispense Refill  . albuterol (PROVENTIL,VENTOLIN) 90 MCG/ACT inhaler Inhale 2 puffs into the lungs every 4 (four) hours as needed for shortness of breath.  17 g  0  . aspirin 81 MG EC tablet Take 81  mg by mouth daily.        . Cholecalciferol (VITAMIN D PO) Take by mouth daily.        . enalapril (VASOTEC) 10 MG tablet Take 5 mg by mouth 2 (two) times daily.        . fenofibrate (TRICOR) 145 MG tablet Take 1 tablet (145 mg total) by mouth daily.  30 tablet  11  . furosemide (LASIX) 20 MG tablet Take 1 tablet (20 mg total) by mouth daily.  30 tablet  5  . glimepiride (AMARYL) 2 MG tablet Take 2 mg by mouth daily before breakfast.       . nitroGLYCERIN (NITRODUR - DOSED IN MG/24 HR) 0.2 mg/hr Place 1 patch (0.2 mg total) onto the skin daily.  30 patch  5  . omeprazole (PRILOSEC) 20 MG capsule Take 20 mg by mouth daily.        . pravastatin (PRAVACHOL) 40 MG tablet Take 1 tablet (40 mg total) by mouth every evening.  30 tablet  5    Allergies  Allergen Reactions  . Carvedilol Other (See Comments)    Made dizzy  . Codeine Other (See Comments)    Gets very angry, disoriented  . Levofloxacin (Levaquin) Other (See Comments)    unknown  . Morphine And Related Other (See Comments)    Gets very angry, disoriented    History   Social History  . Marital Status: Widowed  Spouse Name: N/A    Number of Children: 1  . Years of Education: N/A   Occupational History  . electronics technician [employer]   Social History Main Topics  . Smoking status: Former Smoker    Quit date: 08/01/1992  . Smokeless tobacco: Not on file  . Alcohol Use: No  . Drug Use: No  . Sexually Active: Not Currently   Other Topics Concern  . Not on file   Social History Narrative   Pt lives in [place] alone.  Widowed.  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    Family History  Problem Relation Age of Onset  . Stroke Father    Physical Exam: Filed Vitals:   06/30/11 1136  BP: 108/60  Pulse: 64  Height: 5\' 10"  (1.778 m)  Weight: 200 lb (90.719 kg)    GEN- The patient is well appearing, alert and oriented x 3 today.   Head- normocephalic,  atraumatic Eyes-  Sclera clear, conjunctiva pink Ears- hearing intact Oropharynx- clear Neck- supple, no JVP Lymph- no cervical lymphadenopathy Lungs- Clear to ausculation bilaterally, normal work of breathing Chest- ICD pocket is well healed Heart- Regular rate and rhythm, no murmurs, rubs or gallops, PMI not laterally displaced GI- soft, NT, ND, + BS Extremities- no clubbing, cyanosis, or edema   ICD interrogation- reviewed in detail today,  See PACEART report  Assessment and Plan:

## 2011-06-30 NOTE — Assessment & Plan Note (Signed)
He appears dry on exam today He was recently hospitalized for pneumonia and has had decreased PO intake.  I have advised that he hold his lasix until his SBP is steadily > 100 and his weight returns to baseline.   Normal ICD function See Arita Miss Art report No changes today

## 2011-07-02 DEATH — deceased

## 2011-07-11 ENCOUNTER — Ambulatory Visit (INDEPENDENT_AMBULATORY_CARE_PROVIDER_SITE_OTHER): Payer: Federal, State, Local not specified - PPO

## 2011-07-11 DIAGNOSIS — K219 Gastro-esophageal reflux disease without esophagitis: Secondary | ICD-10-CM

## 2011-07-11 DIAGNOSIS — J159 Unspecified bacterial pneumonia: Secondary | ICD-10-CM

## 2011-07-11 DIAGNOSIS — R05 Cough: Secondary | ICD-10-CM

## 2011-07-11 DIAGNOSIS — R059 Cough, unspecified: Secondary | ICD-10-CM

## 2011-07-17 ENCOUNTER — Other Ambulatory Visit: Payer: Self-pay

## 2011-07-17 ENCOUNTER — Inpatient Hospital Stay (HOSPITAL_COMMUNITY)
Admission: AD | Admit: 2011-07-17 | Discharge: 2011-07-19 | DRG: 541 | Disposition: A | Discharge status: Home / Self Care | Payer: Federal, State, Local not specified - PPO | Source: Ambulatory Visit | Attending: Family Medicine | Admitting: Family Medicine

## 2011-07-17 ENCOUNTER — Ambulatory Visit (INDEPENDENT_AMBULATORY_CARE_PROVIDER_SITE_OTHER): Payer: Federal, State, Local not specified - PPO

## 2011-07-17 ENCOUNTER — Encounter (HOSPITAL_COMMUNITY): Payer: Self-pay | Admitting: Family Medicine

## 2011-07-17 DIAGNOSIS — I495 Sick sinus syndrome: Secondary | ICD-10-CM | POA: Diagnosis present

## 2011-07-17 DIAGNOSIS — E785 Hyperlipidemia, unspecified: Secondary | ICD-10-CM | POA: Diagnosis present

## 2011-07-17 DIAGNOSIS — E7800 Pure hypercholesterolemia, unspecified: Secondary | ICD-10-CM

## 2011-07-17 DIAGNOSIS — I2510 Atherosclerotic heart disease of native coronary artery without angina pectoris: Secondary | ICD-10-CM | POA: Diagnosis present

## 2011-07-17 DIAGNOSIS — J449 Chronic obstructive pulmonary disease, unspecified: Secondary | ICD-10-CM | POA: Diagnosis present

## 2011-07-17 DIAGNOSIS — I5043 Acute on chronic combined systolic (congestive) and diastolic (congestive) heart failure: Secondary | ICD-10-CM | POA: Diagnosis present

## 2011-07-17 DIAGNOSIS — Z95810 Presence of automatic (implantable) cardiac defibrillator: Secondary | ICD-10-CM

## 2011-07-17 DIAGNOSIS — I509 Heart failure, unspecified: Secondary | ICD-10-CM | POA: Diagnosis present

## 2011-07-17 DIAGNOSIS — J441 Chronic obstructive pulmonary disease with (acute) exacerbation: Principal | ICD-10-CM | POA: Diagnosis present

## 2011-07-17 DIAGNOSIS — I252 Old myocardial infarction: Secondary | ICD-10-CM

## 2011-07-17 DIAGNOSIS — I5023 Acute on chronic systolic (congestive) heart failure: Secondary | ICD-10-CM | POA: Diagnosis present

## 2011-07-17 DIAGNOSIS — E119 Type 2 diabetes mellitus without complications: Secondary | ICD-10-CM | POA: Diagnosis present

## 2011-07-17 DIAGNOSIS — Z7982 Long term (current) use of aspirin: Secondary | ICD-10-CM

## 2011-07-17 LAB — CBC
MCH: 29.8 pg (ref 26.0–34.0)
MCHC: 32.2 g/dL (ref 30.0–36.0)
MCV: 92.6 fL (ref 78.0–100.0)
Platelets: 185 10*3/uL (ref 150–400)

## 2011-07-17 LAB — CARDIAC PANEL(CRET KIN+CKTOT+MB+TROPI): Total CK: 60 U/L (ref 7–232)

## 2011-07-17 LAB — BASIC METABOLIC PANEL
Calcium: 9.3 mg/dL (ref 8.4–10.5)
Creatinine, Ser: 1.36 mg/dL — ABNORMAL HIGH (ref 0.50–1.35)
GFR calc non Af Amer: 49 mL/min — ABNORMAL LOW (ref >90)
Glucose, Bld: 207 mg/dL — ABNORMAL HIGH (ref 70–99)
Sodium: 137 mEq/L (ref 135–145)

## 2011-07-17 LAB — PRO B NATRIURETIC PEPTIDE: Pro B Natriuretic peptide (BNP): 487.5 pg/mL — ABNORMAL HIGH (ref 0–450)

## 2011-07-17 MED ORDER — ASPIRIN EC 81 MG PO TBEC
81.0000 mg | DELAYED_RELEASE_TABLET | Freq: Every day | ORAL | Status: DC
  Administered 2011-07-17 – 2011-07-19 (×3): 81 mg via ORAL
  Filled 2011-07-17 (×3): qty 1

## 2011-07-17 MED ORDER — NITROGLYCERIN 0.3 MG SL SUBL
0.3000 mg | SUBLINGUAL_TABLET | SUBLINGUAL | Status: DC | PRN
  Filled 2011-07-17: qty 100

## 2011-07-17 MED ORDER — GLIMEPIRIDE 2 MG PO TABS
2.0000 mg | ORAL_TABLET | Freq: Every day | ORAL | Status: DC
  Administered 2011-07-18 – 2011-07-19 (×2): 2 mg via ORAL
  Filled 2011-07-17 (×4): qty 1

## 2011-07-17 MED ORDER — HEPARIN SODIUM (PORCINE) 5000 UNIT/ML IJ SOLN
5000.0000 [IU] | Freq: Three times a day (TID) | INTRAMUSCULAR | Status: DC
  Administered 2011-07-17 – 2011-07-19 (×5): 5000 [IU] via SUBCUTANEOUS
  Filled 2011-07-17 (×8): qty 1

## 2011-07-17 MED ORDER — PANTOPRAZOLE SODIUM 40 MG PO TBEC
40.0000 mg | DELAYED_RELEASE_TABLET | Freq: Every day | ORAL | Status: DC
  Administered 2011-07-17 – 2011-07-19 (×3): 40 mg via ORAL
  Filled 2011-07-17 (×3): qty 1

## 2011-07-17 MED ORDER — SODIUM CHLORIDE 0.9 % IJ SOLN
3.0000 mL | INTRAMUSCULAR | Status: DC | PRN
  Administered 2011-07-18 – 2011-07-19 (×3): 3 mL via INTRAVENOUS

## 2011-07-17 MED ORDER — GUAIFENESIN 100 MG/5ML PO SOLN
5.0000 mL | ORAL | Status: DC | PRN
  Administered 2011-07-17 – 2011-07-18 (×2): 100 mg via ORAL
  Filled 2011-07-17 (×3): qty 5

## 2011-07-17 MED ORDER — ACETAMINOPHEN 650 MG RE SUPP: 650.0000 mg | Freq: Four times a day (QID) | RECTAL | Status: DC | PRN

## 2011-07-17 MED ORDER — SODIUM CHLORIDE 0.9 % IJ SOLN
3.0000 mL | Freq: Two times a day (BID) | INTRAMUSCULAR | Status: DC
  Administered 2011-07-17 – 2011-07-18 (×4): 3 mL via INTRAVENOUS

## 2011-07-17 MED ORDER — FUROSEMIDE 10 MG/ML IJ SOLN
80.0000 mg | Freq: Every day | INTRAMUSCULAR | Status: DC
  Administered 2011-07-17: 80 mg via INTRAVENOUS
  Filled 2011-07-17: qty 8

## 2011-07-17 MED ORDER — ACETAMINOPHEN 325 MG PO TABS: 650.0000 mg | ORAL_TABLET | Freq: Four times a day (QID) | ORAL | Status: DC | PRN

## 2011-07-17 MED ORDER — SODIUM CHLORIDE 0.9 % IV SOLN: 250.0000 mL | INTRAVENOUS | Status: DC | PRN

## 2011-07-17 MED ORDER — ALBUTEROL 90 MCG/ACT IN AERS
2.0000 | INHALATION_SPRAY | RESPIRATORY_TRACT | Status: DC | PRN
  Administered 2011-07-17: 2 via RESPIRATORY_TRACT
  Filled 2011-07-17 (×2): qty 2

## 2011-07-17 MED ORDER — ENALAPRIL MALEATE 5 MG PO TABS
5.0000 mg | ORAL_TABLET | Freq: Two times a day (BID) | ORAL | Status: DC
  Administered 2011-07-17 – 2011-07-19 (×4): 5 mg via ORAL
  Filled 2011-07-17 (×5): qty 1

## 2011-07-17 MED ORDER — SIMVASTATIN 20 MG PO TABS
20.0000 mg | ORAL_TABLET | Freq: Every day | ORAL | Status: DC
  Administered 2011-07-17 – 2011-07-18 (×2): 20 mg via ORAL
  Filled 2011-07-17 (×3): qty 1

## 2011-07-17 MED ORDER — ASPIRIN 81 MG PO TBEC: 81.0000 mg | DELAYED_RELEASE_TABLET | Freq: Every day | ORAL | Status: DC

## 2011-07-17 MED ORDER — FLUTICASONE-SALMETEROL 250-50 MCG/DOSE IN AEPB
1.0000 | INHALATION_SPRAY | Freq: Two times a day (BID) | RESPIRATORY_TRACT | Status: DC
  Administered 2011-07-17 – 2011-07-18 (×2): 1 via RESPIRATORY_TRACT
  Filled 2011-07-17: qty 14

## 2011-07-17 NOTE — H&P (Signed)
Antonio Ray is an 77 y.o. male.    PCP:  [facility]  Chief Complaint: Worsening cough and shortness of breath. HPI:  77 year old male with a past medical history significant for systolic congestive heart failure, coronary artery disease, sick sinus syndrome and COPD presents with 2 weeks of shortness of breath, and one week of worsening cough. He is a direct admit from [place] urgent care he was brought in via EMS with a new onset oxygen requirement. Of note the patient was recently discharged to the hospital about 2 weeks ago following treatment for pneumonia. He states that his symptoms worsened over the last week and were initially associated with sore throat has since resolved. He denies associated chest pain, nausea, vomiting, diaphoresis. He also denies fever. He is compliant with his home medications which include 20 mg of Lasix daily. He denies change in diet or increased fluid intake.  Urgent care course: Patient presented to the [facility] urgent care due to worsening cough and shortness of breath. He had bilateral rales on lung exam and trace pitting edema up to his ankles. Chest x-ray obtained upon urgent care significant for bibasilar infiltrates. Patient's O2 sats were 92% on room air. He was given a dose of Lasix and supplement oxygen. Sats on 1 L were 93-95%.  Past Medical History  Diagnosis Date  . GERD (gastroesophageal reflux disease)   . Coronary artery disease     History of remote anterior MI with PCI to LAD in 2006; most recent cath 2007, no intervention required  . MI, old 2007    ANTERIOR  . Chronic systolic dysfunction of left ventricle     EF 30-35%, CLASS II - III SYMPTOMS; intolerant to Coreg  . Hyperlipidemia   . PVC's (premature ventricular contractions)   . Diabetes mellitus   . DOE (dyspnea on exertion)   . ICD (implantable cardiac defibrillator) in place August 2012  . Intolerance, drug     Intolerant to beta blockers    Past Surgical History    Procedure Date  . Cardiac catheterization 01/11/2006    DEMONSTRATES AKINESIA OF THE DISTAL ANTERIOR WALL, DISTAL INFERIOR WALL AND AKINESIA OF THE APEX. THE BASAL SEGMENTS CONTRACT WELL AND OVERALL EF 35%  . Coronary angioplasty 1994    TO THE LAD  . Foot surgery     remote  . Cholecystectomy 2/11  . Icd implant 8/12    Family History  Problem Relation Age of Onset  . Stroke Father    Social History:  reports that he quit smoking about 18 years ago. He does not have any smokeless tobacco history on file. He reports that he drinks alcohol. He reports that he does not use illicit drugs.  Allergies:  Allergies  Allergen Reactions  . Carvedilol Other (See Comments)    Made dizzy  . Codeine Other (See Comments)    Gets very angry, disoriented  . Levofloxacin (Levaquin) Other (See Comments)    unknown  . Morphine And Related Other (See Comments)    Gets very angry, disoriented     Medications Prior to Admission  Medication Sig Dispense Refill  . albuterol (PROVENTIL,VENTOLIN) 90 MCG/ACT inhaler Inhale 2 puffs into the lungs every 4 (four) hours as needed. For shortness of breath.       Marland Kitchen aspirin 81 MG EC tablet Take 81 mg by mouth daily.        . Cholecalciferol (VITAMIN D PO) Take 1 tablet by mouth daily.       Marland Kitchen  enalapril (VASOTEC) 10 MG tablet Take 5 mg by mouth 2 (two) times daily.        . fenofibrate (TRICOR) 145 MG tablet Take 1 tablet (145 mg total) by mouth daily.  30 tablet  11  . furosemide (LASIX) 20 MG tablet Take 1 tablet (20 mg total) by mouth daily.  30 tablet  5  . glimepiride (AMARYL) 2 MG tablet Take 2 mg by mouth daily before breakfast.       . nitroGLYCERIN (NITRODUR - DOSED IN MG/24 HR) 0.2 mg/hr Place 1 patch (0.2 mg total) onto the skin daily.  30 patch  5  . omeprazole (PRILOSEC) 20 MG capsule Take 20 mg by mouth daily.        . pravastatin (PRAVACHOL) 40 MG tablet Take 1 tablet (40 mg total) by mouth every evening.  30 tablet  5    Pertinent Labs:   WBC: 10.4 --> 9.8  H/H 14.3/45 -->13.3/41.3 K+: 4.7  Cr: 1.36  CE: Trop < 3.0 proBNP: 487   Studies: Unable to view CXR on CD.  EKG-pending.   ROS Pertinent ROS as per HPI  Blood pressure 98/57, pulse 67, temperature 98.9 F (37.2 C), temperature source Oral, resp. rate 22, height 5\' 10"  (1.778 m), weight 199 lb 8 oz (90.493 kg), SpO2 92.00%. Physical Exam  General appearance: alert, cooperative and no distress Head: Normocephalic, without obvious abnormality, atraumatic Eyes: conjunctivae/corneas clear. PERRL, EOM's intact.  Throat: lips, mucosa, and tongue normal; teeth and gums normal Neck: no adenopathy, no carotid bruit, no JVD, supple, symmetrical, trachea midline and thyroid not enlarged, symmetric, no tenderness/mass/nodules Lungs: Normal work of breathing. Diffuse expiratory wheezing. Mild crackles at bilateral bases. Heart: regular rate and rhythm, S1, S2 normal, no murmur, click, rub or gallop Abdomen: soft, non-tender; bowel sounds normal; no masses,  no organomegaly Extremities: extremities normal, atraumatic, no cyanosis or edema Pulses: 2+ and symmetric Skin: Skin color, texture, turgor normal. No rashes or lesions Neurologic: Grossly normal   Assessment/Plan  77 year old male with a known history of systolic congestive heart failure (EF 32%) and COPD presents with shortness of breath and cough.   1. shortness of breath: A: Closely multifactorial from mild CHF exacerbation and COPD exacerbation. Patient denies chest pain but given his significant coronary artery disease history I obtained one set of cardiac enzymes which are negative. Also consider pneumonia, but this is unlikely given lack of white blood cell count and fever. Also there is no report of focal infiltrate on chest x-ray. Finally I have considered PE. It is also likely given the patient will score 0.  P: -Followup EKG. -Increase the Lasix to 60 milligrams by mouth daily after one 80 mg IV  dose. -Continue home albuterol. -Add inhaled corticosteroid. -Repeat chest x-ray in a.m. if patient does not clinically improved.   2. Coronary artery disease: Stable. Continue home aspirin and ACE inhibitor. Sublingual nitroglycerin available when necessary chest pain. Followup EKG.   3. Diabetes: CBGs within normal limits. Continue home Amaryl. Low-carb diet. Check CBGs q. a.c. and at bedtime.  4. FEN GI: Electrolytes of the normal limits. Recheck BMET. in a.m. given increase dose of Lasix  5. DVT prophylaxis: Heparin 5000 units subcutaneous 3 times a day.  6. Disposition: To home pending continued clinical improvement.    Ion Gonnella 07/17/2011, 6:03 PM

## 2011-07-17 NOTE — Plan of Care (Signed)
Problem: Consults Goal: Heart Failure Patient Education (See Patient Education module for education specifics.)  Outcome: Completed/Met Date Met:  07/17/11 Pt said he had vry good teaching 3 weeks ago weighs self daily, still may not be eating mostly low salt food. Reviewed green, yellow, red zone teaching.

## 2011-07-18 ENCOUNTER — Encounter: Payer: Federal, State, Local not specified - PPO | Admitting: Internal Medicine

## 2011-07-18 ENCOUNTER — Inpatient Hospital Stay (HOSPITAL_COMMUNITY): Payer: Federal, State, Local not specified - PPO

## 2011-07-18 DIAGNOSIS — I509 Heart failure, unspecified: Secondary | ICD-10-CM

## 2011-07-18 LAB — BASIC METABOLIC PANEL
BUN: 31 mg/dL — ABNORMAL HIGH (ref 6–23)
Calcium: 9.5 mg/dL (ref 8.4–10.5)
Creatinine, Ser: 1.48 mg/dL — ABNORMAL HIGH (ref 0.50–1.35)
GFR calc Af Amer: 52 mL/min — ABNORMAL LOW (ref >90)
GFR calc non Af Amer: 45 mL/min — ABNORMAL LOW (ref >90)
Glucose, Bld: 110 mg/dL — ABNORMAL HIGH (ref 70–99)

## 2011-07-18 LAB — CBC
MCH: 29.1 pg (ref 26.0–34.0)
MCHC: 31.9 g/dL (ref 30.0–36.0)
Platelets: 186 10*3/uL (ref 150–400)
RDW: 13.2 % (ref 11.5–15.5)

## 2011-07-18 LAB — CARDIAC PANEL(CRET KIN+CKTOT+MB+TROPI): Total CK: 67 U/L (ref 7–232)

## 2011-07-18 MED ORDER — ALBUTEROL 90 MCG/ACT IN AERS: 2.0000 | INHALATION_SPRAY | RESPIRATORY_TRACT | Status: DC

## 2011-07-18 MED ORDER — ALBUTEROL SULFATE HFA 108 (90 BASE) MCG/ACT IN AERS: 2.0000 | INHALATION_SPRAY | RESPIRATORY_TRACT | Status: DC | PRN

## 2011-07-18 MED ORDER — BENZONATATE 100 MG PO CAPS
100.0000 mg | ORAL_CAPSULE | Freq: Two times a day (BID) | ORAL | Status: DC
  Administered 2011-07-18 – 2011-07-19 (×2): 100 mg via ORAL
  Filled 2011-07-18 (×5): qty 1

## 2011-07-18 MED ORDER — ALBUTEROL SULFATE HFA 108 (90 BASE) MCG/ACT IN AERS
2.0000 | INHALATION_SPRAY | RESPIRATORY_TRACT | Status: DC
  Filled 2011-07-18: qty 6.7

## 2011-07-18 MED ORDER — FUROSEMIDE 20 MG PO TABS
20.0000 mg | ORAL_TABLET | Freq: Every day | ORAL | Status: DC
  Administered 2011-07-18 – 2011-07-19 (×2): 20 mg via ORAL
  Filled 2011-07-18 (×3): qty 1

## 2011-07-18 MED ORDER — FUROSEMIDE 10 MG/ML IJ SOLN
40.0000 mg | Freq: Every day | INTRAMUSCULAR | Status: DC
  Filled 2011-07-18: qty 4

## 2011-07-18 MED ORDER — ALBUTEROL SULFATE HFA 108 (90 BASE) MCG/ACT IN AERS
2.0000 | INHALATION_SPRAY | RESPIRATORY_TRACT | Status: AC
  Administered 2011-07-18 (×2): 2 via RESPIRATORY_TRACT
  Filled 2011-07-18: qty 6.7

## 2011-07-18 MED ORDER — FUROSEMIDE 10 MG/ML IJ SOLN: 20.0000 mg | Freq: Every day | INTRAMUSCULAR | Status: DC

## 2011-07-18 NOTE — H&P (Signed)
FMTS Attending Admission Note: Denny Levy MD [phone] pager office [phone] I  have seen and examined this patient at the time of his arrival to the floor on 07/17/2011; I, reviewed his chart. I have discussed this patient with the resident. I agree with the resident's findings, assessment and care plan as above.

## 2011-07-18 NOTE — Progress Notes (Signed)
Pt would like to be discharged tomorrow.

## 2011-07-18 NOTE — Progress Notes (Signed)
Inpatient Diabetes Program Recommendations  AACE/ADA: New Consensus Statement on Inpatient Glycemic Control (2009)  Target Ranges:  Prepandial:   less than 140 mg/dL      Peak postprandial:   less than 180 mg/dL (1-2 hours)      Critically ill patients:  140 - 180 mg/dL   Reason for Visit:   Inpatient Diabetes Program Recommendations Correction (SSI): Add Novolog sensitive tidwc Diet: Add CHO mod med to heart healthy diet  Note:

## 2011-07-18 NOTE — Progress Notes (Addendum)
   CARE MANAGEMENT NOTE HEART FAILURE  07/18/2011   Patient:  Antonio Ray, Antonio Ray   Account Number:  [id number]    Date Initiated:  07/18/2011  Documentation initiated by:  Shannan Harper  Subjective/Objective Assessment:   Patient admitted from urgent care with worsening SOB. Discharged two weeks ago from hospital due to pna.   Action/Plan:   Patient lives alone, he is widowed.  Works a full time job at the post [employer].  Able to assist himself without any concerns PTA.   Anticipated DC Date:  07/21/2011  Anticipated DC Plan:  HOME W HOME HEALTH SERVICES  DC Planning Services:  CM consult    Choice offered to / List presented to:          Status of service:  In process, will continue to follow  Medicare Important Message Given:   (If response is "NO", the following Medicare IM given date fields will be blank) Date Medicare IM Given:   Date Additional Medicare IM Given:    Discharge Disposition:    Per UR Regulation:  Reviewed for med. necessity/level of care/duration of stay  Comments:   UR completed. 07/18/11 1420 Shannan Harper, RN, BSN   Initial CM contact:  07/18/2011 12:00 M  By:  Shannan Harper Initial CSW contact:     By:      Is this an INP Readmission < 30 days:  Y (If "YES" please see readmission information at the bottom of note)  Patient living status prior to this admission:  ALONE  Patient setting prior to this admission:  HOME  Comorbid conditions being treated that contributed to this admission:  HIGH-CAD, MI, DM, CHF  CHF Readmission Risk:  high  Type of patient education provided  HF Patient Education Assessment / Teach Back  HF Zone Tool / Magnet  Limit salt intake  Weigh daily     Patient education provided by  [facility]    Was referral made to [facility]:  N  Is the patient's PCP the same as attending:  N PCP:    Readmission < 30 Days If pt has HH, did they contact the agency before going to the ED:  N Name of [facility] agency:    Was the  follow-up physician visit scheduled prior to discharge:    Did the patient follow-up with the physician prior to this readmission:  N  Was there HF Clinic visits prior to readmission:    Were there ED visits between admissions:  Y  Readmit type:  Unscheduled/Related  If unscheduled and related indicate reason for readmit:    Contributing reason(s) for readmission  Return of Symptoms

## 2011-07-18 NOTE — Progress Notes (Signed)
Family Medicine Teaching Service [facility] Progress Note  Patient name: Antonio Ray Medical record number: [id number] Date of birth: 1934/01/17 Age: 77 y.o. Gender: male    LOS: 1 day   Primary Care Provider: [name]., MD, MD  Overnight Events: No acute events overnight. Patient on O2 and markedly overnight. Mild superficial sternal chest pain on deep inspiration only. Tolerating regular diet. Denies headache, nausea, vomiting, diarrhea, shortness of breath, syncope, diaphoresis.   Objective: Vital signs in last 24 hours: Temp:  [98 F (36.7 C)-98.9 F (37.2 C)] 98 F (36.7 C) (12/17 0500) Pulse Rate:  [63-80] 63  (12/17 0500) Resp:  [20-22] 20  (12/17 0500) BP: (98-122)/(56-60) 110/56 mmHg (12/17 0500) SpO2:  [89 %-93 %] 89 % (12/17 0840) Weight:  [195 lb 8.8 oz (88.7 kg)-199 lb 8 oz (90.493 kg)] 195 lb 8.8 oz (88.7 kg) (12/17 0500)  Wt Readings from Last 3 Encounters:  07/18/11 195 lb 8.8 oz (88.7 kg)  06/30/11 200 lb (90.719 kg)  06/08/2011 206 lb (93.441 kg)     Current Facility-Administered Medications  Medication Dose Route Frequency Provider Last Rate Last Dose  . 0.9 %  sodium chloride infusion  250 mL Intravenous PRN Josalyn Funches      . acetaminophen (TYLENOL) tablet 650 mg  650 mg Oral Q6H PRN Josalyn Funches       Or  . acetaminophen (TYLENOL) suppository 650 mg  650 mg Rectal Q6H PRN Josalyn Funches      . albuterol (PROVENTIL,VENTOLIN) inhaler 2 puff  2 puff Inhalation Q4H PRN Josalyn Funches   2 puff at 07/17/11 1848  . aspirin EC tablet 81 mg  81 mg Oral Daily Denny Levy, MD   81 mg at 07/17/11 1610  . enalapril (VASOTEC) tablet 5 mg  5 mg Oral BID Josalyn Funches   5 mg at 07/17/11 2158  . Fluticasone-Salmeterol (ADVAIR) 250-50 MCG/DOSE inhaler 1 puff  1 puff Inhalation BID Josalyn Funches   1 puff at 07/18/11 0840  . furosemide (LASIX) injection 40 mg  40 mg Intravenous Daily Josalyn Funches      . glimepiride (AMARYL) tablet 2 mg  2 mg Oral QAC  breakfast Josalyn Funches      . guaiFENesin (ROBITUSSIN) 100 MG/5ML solution 100 mg  5 mL Oral Q4H PRN Josalyn Funches   100 mg at 07/17/11 2159  . heparin injection 5,000 Units  5,000 Units Subcutaneous Q8H Josalyn Funches   5,000 Units at 07/18/11 0824  . nitroGLYCERIN (NITROSTAT) SL tablet 0.3 mg  0.3 mg Sublingual Q5 min PRN Josalyn Funches      . pantoprazole (PROTONIX) EC tablet 40 mg  40 mg Oral Q1200 Josalyn Funches   40 mg at 07/17/11 1610  . simvastatin (ZOCOR) tablet 20 mg  20 mg Oral q1800 Josalyn Funches   20 mg at 07/17/11 1833  . sodium chloride 0.9 % injection 3 mL  3 mL Intravenous Q12H Josalyn Funches   3 mL at 07/17/11 2205  . sodium chloride 0.9 % injection 3 mL  3 mL Intravenous PRN Josalyn Funches      . DISCONTD: aspirin EC tablet 81 mg  81 mg Oral Daily Josalyn Funches      . DISCONTD: furosemide (LASIX) injection 80 mg  80 mg Intravenous Daily Josalyn Funches   80 mg at 07/17/11 1610     PE: Gen: No acute distress, HEENT: moist because membranes CV: Regular rate and rhythm, no murmurs rubs or gallops, pacemaker in place Res:  Clear to auscultation bilaterally, good air movement Abd: Normal active bowel sounds, nonpainful to palpation Ext/Musc: Mild ostial skeletal pain on palpation at sternal borders Neuro: Cranial nerves grossly intact  Labs/Studies:  Basic Metabolic Panel:    Component Value Date/Time   NA 140 07/18/2011 0500   K 4.0 07/18/2011 0500   CL 103 07/18/2011 0500   CO2 30 07/18/2011 0500   BUN 31* 07/18/2011 0500   CREATININE 1.48* 07/18/2011 0500   GLUCOSE 110* 07/18/2011 0500   CALCIUM 9.5 07/18/2011 0500   CBC:    Component Value Date/Time   WBC 8.0 07/18/2011 0500   HGB 12.8* 07/18/2011 0500   HCT 40.1 07/18/2011 0500   PLT 186 07/18/2011 0500   MCV 91.1 07/18/2011 0500   NEUTROABS 17.0* 06/18/2011 0934   LYMPHSABS 0.7 06/18/2011 0934   MONOABS 1.0 06/18/2011 0934   EOSABS 0.1 06/18/2011 0934   BASOSABS 0.0 06/18/2011 0934     BNP: 487.5     Assessment/Plan:  77 year old male with a known history of systolic congestive heart failure (EF 32%) with pacemaker in place and COPD with improving shortness of breath and cough.   1. SOB: Likely combination of CHF and COPD exacerbation. CP likely musculoskeletal as BNP and EKG unremarkable. Patient responded well to increased Lasix dosing. Will place patient back on home Lasix dose of 20 mg daily will wean O2 as tolerated. Will continue albuterol when necessary and Advair twice a day. Will continue Robitussin for cough.  2. Coronary artery disease: Stable. No signs of current exacerbation. Continue home ASA and ACE inhibitor. Sublingual nitro PRN.    3. Diabetes: Continue home Amaryl. Low-carb diet. Check CBGs QAC QHS. Will monitor   4. FEN GI: IVF KVO. Slight bump on Cr likely from additional lasix. Will continue to allow PO Adlib   5. DVT prophylaxis: Heparin 5000 units subcutaneous 3 times a day.   6. Disposition: To home pending continued clinical improvement. Will likely need close cardiac follow-up and evaluation of home lasix dose.        Signed: Shelly Flatten, MD Family Medicine Resident PGY-1 07/18/2011 9:23 AM   Patient seen and examined by me, discussed with resident team and I agree with assessment and plan by Dr. Konrad Dolores.  By my exam, patient is well appearing, no dyspnea, speech fluid.  Plan to wean off O2 nasal canula; home dose oral lasix. Agree with dispo plan. Zalma Channing O

## 2011-07-19 ENCOUNTER — Encounter (HOSPITAL_COMMUNITY): Payer: Self-pay | Admitting: General Practice

## 2011-07-19 LAB — GLUCOSE, CAPILLARY: Glucose-Capillary: 141 mg/dL — ABNORMAL HIGH (ref 70–99)

## 2011-07-19 MED ORDER — GUAIFENESIN 100 MG/5ML PO SOLN: 5.0000 mL | ORAL | Status: DC | PRN

## 2011-07-19 MED ORDER — BENZONATATE 100 MG PO CAPS: 100.0000 mg | ORAL_CAPSULE | Freq: Two times a day (BID) | ORAL | Status: AC

## 2011-07-19 MED ORDER — FLUTICASONE-SALMETEROL 250-50 MCG/DOSE IN AEPB: 1.0000 | INHALATION_SPRAY | Freq: Two times a day (BID) | RESPIRATORY_TRACT | Status: DC

## 2011-07-19 NOTE — Progress Notes (Signed)
FMTS Attending Progress Note  Patient seen and examined by me, discussed with resident team.  He is looking much better today, no dyspnea.  Still with some productive cough.  I agree with plan for discharge to home today.  See housestaff notes for additional details. Taner Rzepka O

## 2011-07-21 ENCOUNTER — Ambulatory Visit (INDEPENDENT_AMBULATORY_CARE_PROVIDER_SITE_OTHER): Payer: Federal, State, Local not specified - PPO

## 2011-07-21 DIAGNOSIS — I509 Heart failure, unspecified: Secondary | ICD-10-CM

## 2011-07-21 DIAGNOSIS — R05 Cough: Secondary | ICD-10-CM

## 2011-07-22 ENCOUNTER — Encounter: Payer: Self-pay | Admitting: Internal Medicine

## 2011-07-25 ENCOUNTER — Telehealth: Payer: Self-pay | Admitting: Cardiology

## 2011-07-25 NOTE — Telephone Encounter (Signed)
Called to let us know he was in hospital last week and Dr. Hal Hope changed his enalapril to Losartan 50 mg due to a cough he has had. Advised that was fine. He states that she was going to get in touch with Dr. [place]. Advised that he would be out of the office this week but would be back next week.

## 2011-07-25 NOTE — Telephone Encounter (Signed)
New message:  Pt has a question regarding a new prescription he is on.  Please call him back.

## 2011-07-31 ENCOUNTER — Other Ambulatory Visit: Payer: Self-pay | Admitting: Cardiology

## 2011-08-02 NOTE — Discharge Summary (Signed)
Family Medicine Resident Discharge Summary  Patient ID: Antonio Ray [id number] 78 y.o. 1933-08-09  Admit date: 07/17/2011  Discharge date and time: 07/19/2011  3:50 PM   Admitting Physician: Denny Levy, MD   Discharge Physician: Paula Compton, MD  Admission Diagnoses: CHF exacerbation, COPD exacerbation  Discharge Diagnoses: systolic CHF exacerbation and COPD exacerbation  Admission Condition: fair  Discharged Condition: good  Indication for Admission: Increasing SOB and cough  Hospital Course:   COPD Exacerbation: pt placed on O2 to maintain O2 sats and continue on home albuterol. Pt was started and sent home on Advair. Pt responded well to therapy and was stable on RA at time of DC.   CHF Exacerbation: Pt w/ Pro BNP of 487 at time of admission. Cardiac enzymes and EKG reasuring of no new cardiac pathology. Pt continued on home BP meds and received significant diuresis during admission. Pt w/ approximately 2.3 liters net out during admission.   Pt w/ known CAD, but w/ no signs of exacerbation during hospitalization. Continued on Lisinopril and ASA.    Consults: none  Significant Diagnostic Studies:   BUN/CR: 29/1.36  Cardiac enzymes negative x2  Pro BNP 487.5  CBC: Unremarkable  CXR: Findings: The lungs remain hyperaerated consistent with COPD. There is peribronchial thickening indicative of bronchitis. No pneumonia or effusion is seen. The heart remains mildly enlarged. A permanent pacemaker is unchanged.  EKG: Normal sinus rhythm, Low voltage QRS, Septal infarct , age undetermined, No significant change since last tracing  Discharge Exam: Gen: No acute distress,  HEENT: moist because membranes  CV: Regular rate and rhythm, no murmurs rubs or gallops, pacemaker in place  Res: CTAB,  good air movement  Abd: Normal active bowel sounds, nonpainful to palpation  Ext/Musc: Mild ostial skeletal pain on palpation at sternal borders  Neuro: Cranial nerves grossly  intact  Disposition: home  Patient Instructions:  Discharge Medication List as of 07/19/2011  3:10 PM    START taking these medications   Details  benzonatate (TESSALON) 100 MG capsule Take 1 capsule (100 mg total) by mouth 2 (two) times daily., Starting 07/19/2011, Until Tue 07/26/11, Normal    Fluticasone-Salmeterol (ADVAIR DISKUS) 250-50 MCG/DOSE AEPB Inhale 1 puff into the lungs 2 (two) times daily., Starting 07/19/2011, Until Discontinued, Normal    guaiFENesin (ROBITUSSIN) 100 MG/5ML SOLN Take 5 mLs (100 mg total) by mouth every 4 (four) hours as needed., Starting 07/19/2011, Until Discontinued, Normal      CONTINUE these medications which have NOT CHANGED   Details  albuterol (PROVENTIL,VENTOLIN) 90 MCG/ACT inhaler Inhale 2 puffs into the lungs every 4 (four) hours as needed. For shortness of breath. , Starting 06/17/2011, Until Thu 06/14/12, Historical Med    aspirin 81 MG EC tablet Take 81 mg by mouth daily.  , Until Discontinued, Historical Med    Cholecalciferol (VITAMIN D PO) Take 1 tablet by mouth daily. , Until Discontinued, Historical Med    fenofibrate (TRICOR) 145 MG tablet Take 1 tablet (145 mg total) by mouth daily., Starting 11/17/2010, Until Thu 11/17/11, Normal    furosemide (LASIX) 20 MG tablet Take 1 tablet (20 mg total) by mouth daily., Starting 02/22/2011, Until Discontinued, Normal    glimepiride (AMARYL) 2 MG tablet Take 2 mg by mouth daily before breakfast. , Until Discontinued, Historical Med    omeprazole (PRILOSEC) 20 MG capsule Take 20 mg by mouth daily.  , Until Discontinued, Historical Med    pravastatin (PRAVACHOL) 40 MG tablet Take 1 tablet (40 mg  total) by mouth every evening., Starting 06/06/2011, Until Tue 06/05/12, Normal    enalapril (VASOTEC) 10 MG tablet Take 5 mg by mouth 2 (two) times daily.  , Starting 04/05/2011, Until Discontinued, Historical Med    nitroGLYCERIN (NITRODUR - DOSED IN MG/24 HR) 0.2 mg/hr Place 1 patch (0.2 mg total) onto  the skin daily., Starting 01/31/2011, Until Discontinued, Normal       Activity: activity as tolerated Diet: regular diet Wound Care: none needed  Follow-up Items: 1. COPD. Now on Advair 2. CHF. Lasix dosing.  Signed: Shelly Flatten, MD Family Medicine Resident PGY-1 08/02/2011 1:45 AM

## 2011-08-22 ENCOUNTER — Encounter: Payer: Self-pay | Admitting: Family Medicine

## 2011-08-22 ENCOUNTER — Encounter (INDEPENDENT_AMBULATORY_CARE_PROVIDER_SITE_OTHER): Payer: Federal, State, Local not specified - PPO | Admitting: Family Medicine

## 2011-08-22 DIAGNOSIS — Z0000 Encounter for general adult medical examination without abnormal findings: Secondary | ICD-10-CM

## 2011-08-22 DIAGNOSIS — Z79899 Other long term (current) drug therapy: Secondary | ICD-10-CM

## 2011-08-22 DIAGNOSIS — N400 Enlarged prostate without lower urinary tract symptoms: Secondary | ICD-10-CM

## 2011-08-25 ENCOUNTER — Other Ambulatory Visit: Payer: Self-pay | Admitting: Cardiology

## 2011-08-30 ENCOUNTER — Other Ambulatory Visit: Payer: Self-pay | Admitting: Physician Assistant

## 2011-08-30 MED ORDER — GLIMEPIRIDE 2 MG PO TABS: 2.0000 mg | ORAL_TABLET | Freq: Every day | ORAL | Status: DC

## 2011-09-26 ENCOUNTER — Ambulatory Visit (INDEPENDENT_AMBULATORY_CARE_PROVIDER_SITE_OTHER): Payer: Federal, State, Local not specified - PPO | Admitting: Family Medicine

## 2011-09-26 ENCOUNTER — Encounter: Payer: Self-pay | Admitting: Family Medicine

## 2011-09-26 VITALS — BP 115/59 | HR 60 | Temp 97.0°F | Resp 18 | Ht 68.0 in | Wt 206.0 lb

## 2011-09-26 DIAGNOSIS — I10 Essential (primary) hypertension: Secondary | ICD-10-CM

## 2011-09-26 DIAGNOSIS — K219 Gastro-esophageal reflux disease without esophagitis: Secondary | ICD-10-CM | POA: Insufficient documentation

## 2011-09-26 DIAGNOSIS — I2510 Atherosclerotic heart disease of native coronary artery without angina pectoris: Secondary | ICD-10-CM

## 2011-09-26 DIAGNOSIS — N400 Enlarged prostate without lower urinary tract symptoms: Secondary | ICD-10-CM

## 2011-09-26 DIAGNOSIS — E119 Type 2 diabetes mellitus without complications: Secondary | ICD-10-CM

## 2011-09-26 DIAGNOSIS — I509 Heart failure, unspecified: Secondary | ICD-10-CM

## 2011-09-26 DIAGNOSIS — J449 Chronic obstructive pulmonary disease, unspecified: Secondary | ICD-10-CM

## 2011-09-26 NOTE — Progress Notes (Signed)
  Subjective:    Patient ID: Antonio Ray, male    DOB: 1933/08/03, 78 y.o.   MRN: [id number]  HPI  Patient presents for routine follow up.  1) Patient states when he bends forward or gets up too fast he feels dizzy.  No pre syncope/syncope or falls. Stopped amaryl, prolosec, flomax as he believed the medications were contributing to symptoms  2) Type 2 DM- fasting blood sugars are running between 98-146.  No hypoglycemia however patient feels amaryl may be the primary culprit in causing dizziness  When he stopped the Amaryl he shares his dizziness improved.  3) Gerd symptoms persist; worse with lactose products.  Review of Systems     Objective:   Physical Exam  Constitutional: He appears well-developed and well-nourished.  HENT:  Head: Normocephalic and atraumatic.  Neck: Neck supple. No JVD present. Carotid bruit is not present. No thyromegaly present.  Cardiovascular: Normal rate, regular rhythm and normal heart sounds.   Pulmonary/Chest: Effort normal and breath sounds normal.  Musculoskeletal: Edema: trace LE.  Neurological: He is alert. He has normal strength. He displays normal reflexes. No cranial nerve deficit or sensory deficit. Coordination normal.  Skin: Skin is warm.  Psychiatric: He has a normal mood and affect.    Results for orders placed in visit on 09/26/11  POCT GLYCOSYLATED HEMOGLOBIN (HGB A1C)      Component Value Range   Hemoglobin A1C 7.4         Assessment & Plan:   1. DM (diabetes mellitus)  POCT glycosylated hemoglobin (Hb A1C)  2. COPD (chronic obstructive pulmonary disease)    3. HTN (hypertension)    4. BPH (benign prostatic hyperplasia)    5. GERD (gastroesophageal reflux disease)  Behavioral modification  6. CAD (coronary artery disease)    7. CHF (congestive heart failure)  Keep follow up with Dr. Sw[place]iland   Encouraged patient to resume Amaryl given that his A1C is rising. I offered to change patient to Januvia since he believes Amaryl  may be contributing to his dizziness. He declined to swap medications at this visit. I believe the patient's dizziness may secondary to his systolic dysfunction and blood pressures that run in the low 100 range.  He agreed to speak with Dr. Swaziland regarding his dizziness as it relates to heart function.  I do not believe he has autonomic dysfunction from his diabetes or posterior cerebral insuffiencey to attribute his symptoms to. I will see patient back in 3 months to recheck A1C.  He will call me sooner if dizziness worsens.  His son, Jaydien Panepinto, was present at this office visit.

## 2011-09-28 ENCOUNTER — Telehealth: Payer: Self-pay | Admitting: Cardiology

## 2011-09-29 ENCOUNTER — Encounter: Payer: Self-pay | Admitting: Internal Medicine

## 2011-09-29 ENCOUNTER — Ambulatory Visit (INDEPENDENT_AMBULATORY_CARE_PROVIDER_SITE_OTHER): Payer: Federal, State, Local not specified - PPO | Admitting: *Deleted

## 2011-09-29 DIAGNOSIS — Z95810 Presence of automatic (implantable) cardiac defibrillator: Secondary | ICD-10-CM

## 2011-09-29 DIAGNOSIS — I509 Heart failure, unspecified: Secondary | ICD-10-CM

## 2011-09-29 DIAGNOSIS — I5022 Chronic systolic (congestive) heart failure: Secondary | ICD-10-CM

## 2011-09-29 LAB — REMOTE ICD DEVICE
AL AMPLITUDE: 2.2 mv
AL IMPEDENCE ICD: 460 Ohm
BAMS-0003: 60 {beats}/min
DEVICE MODEL ICD: 828604
HV IMPEDENCE: 81 Ohm
RV LEAD IMPEDENCE ICD: 450 Ohm

## 2011-09-30 ENCOUNTER — Other Ambulatory Visit: Payer: Self-pay | Admitting: Family Medicine

## 2011-10-02 ENCOUNTER — Encounter: Payer: Self-pay | Admitting: Family Medicine

## 2011-10-07 ENCOUNTER — Encounter: Payer: Self-pay | Admitting: *Deleted

## 2011-10-11 ENCOUNTER — Other Ambulatory Visit: Payer: Self-pay | Admitting: Family Medicine

## 2011-10-12 NOTE — Progress Notes (Signed)
ICD remote 

## 2011-10-27 ENCOUNTER — Ambulatory Visit (INDEPENDENT_AMBULATORY_CARE_PROVIDER_SITE_OTHER): Payer: Federal, State, Local not specified - PPO | Admitting: Cardiology

## 2011-10-27 ENCOUNTER — Encounter: Payer: Self-pay | Admitting: Cardiology

## 2011-10-27 VITALS — BP 122/60 | HR 72 | Ht 69.0 in | Wt 208.0 lb

## 2011-10-27 DIAGNOSIS — I2510 Atherosclerotic heart disease of native coronary artery without angina pectoris: Secondary | ICD-10-CM

## 2011-10-27 DIAGNOSIS — I509 Heart failure, unspecified: Secondary | ICD-10-CM

## 2011-10-27 DIAGNOSIS — I252 Old myocardial infarction: Secondary | ICD-10-CM

## 2011-10-27 DIAGNOSIS — I5022 Chronic systolic (congestive) heart failure: Secondary | ICD-10-CM

## 2011-10-27 NOTE — Patient Instructions (Signed)
Continue your current medication  Avoid sodium in your diet  I will see you again in 4 months.

## 2011-10-27 NOTE — Progress Notes (Signed)
Sylar Johnsie Kindred Date of Birth: 07-19-1934 Medical Record #[id number]  History of Present Illness: Mr. Wessels is seen back today for a followup visit. He reports that he is doing well. Since his last visit here he has been hospitalized twice. He was hospitalized in mid-November pneumonia and dehydration. He was rehospitalized in December with COPD exacerbation and mild component of CHF. He reports that he is doing well at this time. He denies any significant shortness of breath, edema, or chest pain. He's had no defibrillator discharges. He did stop taking Flomax because this made him feel lightheaded and dizzy. He feels that he is emptying his bladder fairly well.  Current Outpatient Prescriptions on File Prior to Visit  Medication Sig Dispense Refill  . albuterol (PROVENTIL,VENTOLIN) 90 MCG/ACT inhaler Inhale 2 puffs into the lungs every 4 (four) hours as needed. For shortness of breath.       Marland Kitchen aspirin 81 MG EC tablet Take 81 mg by mouth daily.        . Cholecalciferol (VITAMIN D PO) Take 1 tablet by mouth daily.       . fenofibrate (TRICOR) 145 MG tablet Take 1 tablet (145 mg total) by mouth daily.  30 tablet  11  . Fluticasone-Salmeterol (ADVAIR DISKUS) 250-50 MCG/DOSE AEPB Inhale 1 puff into the lungs 2 (two) times daily.  60 each  3  . FREESTYLE TEST STRIPS test strip USE TO TEST BLOOD SUGER TWICE DAILY  100 each  1  . furosemide (LASIX) 20 MG tablet TAKE 1 TABLET BY MOUTH DAILY  30 tablet  4  . glimepiride (AMARYL) 2 MG tablet Take 1 tablet (2 mg total) by mouth daily before breakfast.  30 tablet  3  . losartan (COZAAR) 50 MG tablet Take 50 mg by mouth daily.        . nitroGLYCERIN (NITRODUR - DOSED IN MG/24 HR) 0.2 mg/hr APPLY 1 PATCH TO THE SKIN AND CHANGE DAILY  30 patch  4  . omeprazole (PRILOSEC) 20 MG capsule Take 20 mg by mouth daily.        . pravastatin (PRAVACHOL) 40 MG tablet Take 1 tablet (40 mg total) by mouth every evening.  30 tablet  5  . guaiFENesin (ROBITUSSIN) 100  MG/5ML SOLN Take 5 mLs (100 mg total) by mouth every 4 (four) hours as needed.  30 mL  0    Allergies  Allergen Reactions  . Carvedilol Other (See Comments)    Made dizzy  . Codeine Other (See Comments)    Gets very angry, disoriented  . Levofloxacin (Levaquin) Other (See Comments)    unknown  . Morphine And Related Other (See Comments)    Gets very angry, disoriented    Past Medical History  Diagnosis Date  . GERD (gastroesophageal reflux disease)   . Coronary artery disease     History of remote anterior MI with PCI to LAD in 2006; most recent cath 2007, no intervention required  . MI, old 2007    ANTERIOR  . Chronic systolic dysfunction of left ventricle     EF 30-35%, CLASS II - III SYMPTOMS; intolerant to Coreg  . Hyperlipidemia   . PVC's (premature ventricular contractions)   . Diabetes mellitus   . DOE (dyspnea on exertion)   . ICD (implantable cardiac defibrillator) in place August 2012  . Intolerance, drug     Intolerant to beta blockers  . Myocardial infarction   . CHF (congestive heart failure)   . Pneumonia   .  CRF (chronic renal failure)   . BPH (benign prostatic hyperplasia)     Past Surgical History  Procedure Date  . Cardiac catheterization 01/11/2006    DEMONSTRATES AKINESIA OF THE DISTAL ANTERIOR WALL, DISTAL INFERIOR WALL AND AKINESIA OF THE APEX. THE BASAL SEGMENTS CONTRACT WELL AND OVERALL EF 35%  . Coronary angioplasty 1994    TO THE LAD  . Foot surgery     remote  . Cholecystectomy 2/11  . Icd implant 8/12    History  Smoking status  . Former Smoker  . Quit date: 08/01/1992  Smokeless tobacco  . Never Used    History  Alcohol Use  . 0.0 oz/week  . Marland Kitchen25 drink(s) per week    1 beer every 3-4 months     Family History  Problem Relation Age of Onset  . Stroke Father     Review of Systems: The review of systems is positive for lightheadedness related to medication.  All other systems were reviewed and are negative.  Physical  Exam: BP 122/60  Pulse 72  Ht 5\' 9"  (1.753 m)  Wt 208 lb (94.348 kg)  BMI 30.72 kg/m2 Patient is very pleasant and in no acute distress. Skin is warm and dry. Color is normal.  HEENT is unremarkable. Normocephalic/atraumatic. PERRL. Sclera are nonicteric. Neck is supple. No masses. No JVD. Lungs are clear. Cardiac exam shows a regular rate and rhythm. Abdomen is soft. Extremities are without edema. Gait and ROM are intact. No gross neurologic deficits noted.   LABORATORY DATA:   Assessment / Plan:

## 2011-10-27 NOTE — Assessment & Plan Note (Signed)
He has chronic class II symptoms. He has had no increase in edema or orthopnea. Continue with his current diuretic therapy. He does have a prophylactic ICD in place. I will followup again in 4 months.

## 2011-10-27 NOTE — Assessment & Plan Note (Signed)
He had a remote anterior myocardial infarction 1994. He has no clinical symptoms of angina. His last stress test in October of 2011 showed no ischemia. Continue with his risk factor modification and medical therapy. He does have a history of intolerance to beta blockers.

## 2011-11-17 ENCOUNTER — Telehealth: Payer: Self-pay | Admitting: Cardiology

## 2011-11-17 ENCOUNTER — Ambulatory Visit (INDEPENDENT_AMBULATORY_CARE_PROVIDER_SITE_OTHER): Payer: Federal, State, Local not specified - PPO | Admitting: Family Medicine

## 2011-11-17 VITALS — BP 120/66 | HR 65 | Temp 98.5°F | Resp 16 | Ht 68.0 in | Wt 209.0 lb

## 2011-11-17 DIAGNOSIS — R42 Dizziness and giddiness: Secondary | ICD-10-CM

## 2011-11-17 DIAGNOSIS — H6980 Other specified disorders of Eustachian tube, unspecified ear: Secondary | ICD-10-CM

## 2011-11-17 DIAGNOSIS — E119 Type 2 diabetes mellitus without complications: Secondary | ICD-10-CM

## 2011-11-17 DIAGNOSIS — J302 Other seasonal allergic rhinitis: Secondary | ICD-10-CM

## 2011-11-17 MED ORDER — MECLIZINE HCL 12.5 MG PO TABS: ORAL_TABLET | ORAL | Status: DC

## 2011-11-17 MED ORDER — MOMETASONE FUROATE 50 MCG/ACT NA SUSP: 2.0000 | Freq: Every day | NASAL | Status: DC

## 2011-11-17 NOTE — Telephone Encounter (Signed)
Antonio Ray's daughter is calling to report that upon arriving home from his job around midnight, he became dizzy.  He talked to his daughter this am and told her he is not going to work tonight because he feels queasy.  She states he denies sob last night or today.  No dizziness today.  Blood sugar this am at 8:30 was 158 after eating two cookies at 5:30am.

## 2011-11-17 NOTE — Telephone Encounter (Signed)
Patient daughter Antonio Ray [id number]   Patient started getting dizzy and SOB around midnight, currently feeling queezy.  Daughter says it is not like patient to miss work, there seems to be something else going on  And he needs to be seen today as he has a pacer and dif.

## 2011-11-17 NOTE — Progress Notes (Signed)
  Subjective:    Patient ID: Antonio Ray, male    DOB: 09-12-1933, 78 y.o.   MRN: [id number]  HPI Patient presents after waking up with vertigo. Room was spinning. Symptoms associated with nausea and ataxia.  Recently with more allergy symptoms. Nasal congestion adn watery eyes  Patient contacted Dr. Sw[place]iland and felt patient had inner ear symptoms and asked that he be evaluated here. Denies hypolgycemea fasting sugar this morning was 156  Symptoms have resolved.  No falls No chest symptoms or  SOB or focial deficits  Review of Systems     Objective:   Physical Exam  Constitutional: He is oriented to person, place, and time. He appears well-developed.  HENT:  Head: Normocephalic and atraumatic.  Nose: Mucosal edema present.  Eyes: EOM are normal. Pupils are equal, round, and reactive to light. Left eye exhibits no nystagmus.  Neck: Neck supple.  Cardiovascular: Normal rate, regular rhythm and normal heart sounds.   Pulmonary/Chest: Effort normal and breath sounds normal.  Abdominal: Soft. Bowel sounds are normal.  Neurological: He is alert and oriented to person, place, and time. He has normal reflexes. He displays normal reflexes. No cranial nerve deficit. He exhibits normal muscle tone. Coordination normal.  Skin: Skin is warm.  Psychiatric: He has a normal mood and affect.        Assessment & Plan:   1. Vertigo  meclizine (ANTIVERT) 12.5 MG tablet  2. ETD (eustachian tube dysfunction)    3. Seasonal allergies    4. DM type 2 (diabetes mellitus, type 2)     Monitor BS for reactive hypoglycemia

## 2011-11-17 NOTE — Telephone Encounter (Signed)
Patient called back, stated he had episode of dizziness last night,felt like objects spinning.Stated no fast heart beat,no sob,no chest pain.States a little nausea,blood sugar 158 after two cookies. States feels ok now just a little light headed.Advised to see PCP and call back if needed.

## 2011-11-24 ENCOUNTER — Other Ambulatory Visit: Payer: Self-pay | Admitting: Dermatology

## 2011-12-05 ENCOUNTER — Other Ambulatory Visit: Payer: Self-pay | Admitting: Cardiology

## 2011-12-05 NOTE — Telephone Encounter (Signed)
..   Requested Prescriptions   Pending Prescriptions Disp Refills  . fenofibrate (TRICOR) 145 MG tablet [Pharmacy Med Name: FENOFIBRATE 145MG  TABLETS] 30 tablet 3    Sig: TAKE 1 TABLET BY MOUTH DAILY

## 2011-12-23 ENCOUNTER — Other Ambulatory Visit: Payer: Self-pay | Admitting: Cardiology

## 2011-12-23 DIAGNOSIS — E7800 Pure hypercholesterolemia, unspecified: Secondary | ICD-10-CM

## 2011-12-23 MED ORDER — PRAVASTATIN SODIUM 40 MG PO TABS: 40.0000 mg | ORAL_TABLET | Freq: Every evening | ORAL | Status: DC

## 2011-12-28 ENCOUNTER — Encounter: Payer: Self-pay | Admitting: Internal Medicine

## 2011-12-28 ENCOUNTER — Ambulatory Visit (INDEPENDENT_AMBULATORY_CARE_PROVIDER_SITE_OTHER): Payer: Federal, State, Local not specified - PPO | Admitting: Internal Medicine

## 2011-12-28 VITALS — BP 120/58 | HR 69 | Resp 18 | Ht 70.0 in | Wt 211.8 lb

## 2011-12-28 DIAGNOSIS — Z95810 Presence of automatic (implantable) cardiac defibrillator: Secondary | ICD-10-CM

## 2011-12-28 DIAGNOSIS — I509 Heart failure, unspecified: Secondary | ICD-10-CM

## 2011-12-28 DIAGNOSIS — I5022 Chronic systolic (congestive) heart failure: Secondary | ICD-10-CM

## 2011-12-28 LAB — ICD DEVICE OBSERVATION
AL AMPLITUDE: 1.3 mv
AL THRESHOLD: 0.875 V
ATRIAL PACING ICD: 60 pct
BAMS-0001: 170 {beats}/min
DEV-0020ICD: NEGATIVE
RV LEAD AMPLITUDE: 12 mv
RV LEAD IMPEDENCE ICD: 480 Ohm
RV LEAD THRESHOLD: 0.5 V

## 2011-12-28 NOTE — Progress Notes (Signed)
PCP: [name]., MD, MD Primary Cardiologist:  Dr Sw[place]iland  Antonio Ray is a 78 y.o. male who presents today for routine electrophysiology followup.  Since last being seen in our clinic, the patient reports doing very well.  Today, he denies symptoms of palpitations, chest pain, shortness of breath,  lower extremity edema, presyncope, syncope, or ICD shocks.  The patient is otherwise without complaint today.   Past Medical History  Diagnosis Date  . GERD (gastroesophageal reflux disease)   . Coronary artery disease     History of remote anterior MI with PCI to LAD in 2006; most recent cath 2007, no intervention required  . MI, old 2007    ANTERIOR  . Chronic systolic dysfunction of left ventricle     EF 30-35%, CLASS II - III SYMPTOMS; intolerant to Coreg  . Hyperlipidemia   . PVC's (premature ventricular contractions)   . Diabetes mellitus   . DOE (dyspnea on exertion)   . ICD (implantable cardiac defibrillator) in place August 2012  . Intolerance, drug     Intolerant to beta blockers  . Myocardial infarction   . Pneumonia   . CRF (chronic renal failure)   . BPH (benign prostatic hyperplasia)    Past Surgical History  Procedure Date  . Cardiac catheterization 01/11/2006    DEMONSTRATES AKINESIA OF THE DISTAL ANTERIOR WALL, DISTAL INFERIOR WALL AND AKINESIA OF THE APEX. THE BASAL SEGMENTS CONTRACT WELL AND OVERALL EF 35%  . Coronary angioplasty 1994    TO THE LAD  . Foot surgery     remote  . Cholecystectomy 2/11  . Icd implant 03/30/11    Analyze ST study patient    Current Outpatient Prescriptions  Medication Sig Dispense Refill  . albuterol (PROVENTIL,VENTOLIN) 90 MCG/ACT inhaler Inhale 2 puffs into the lungs every 4 (four) hours as needed. For shortness of breath.       Marland Kitchen aspirin 81 MG EC tablet Take 81 mg by mouth daily.        . Cholecalciferol (VITAMIN D PO) Take 1 tablet by mouth daily.       . fenofibrate (TRICOR) 145 MG tablet TAKE 1 TABLET BY MOUTH DAILY   30 tablet  3  . Fluticasone-Salmeterol (ADVAIR DISKUS) 250-50 MCG/DOSE AEPB Inhale 1 puff into the lungs 2 (two) times daily.  60 each  3  . FREESTYLE TEST STRIPS test strip USE TO TEST BLOOD SUGER TWICE DAILY  100 each  1  . furosemide (LASIX) 20 MG tablet TAKE 1 TABLET BY MOUTH DAILY  30 tablet  4  . glimepiride (AMARYL) 2 MG tablet Take 1 tablet (2 mg total) by mouth daily before breakfast.  30 tablet  3  . losartan (COZAAR) 50 MG tablet Take 50 mg by mouth daily.        . mometasone (NASONEX) 50 MCG/ACT nasal spray Place 2 sprays into the nose daily.  1 g  1  . nitroGLYCERIN (NITRODUR - DOSED IN MG/24 HR) 0.2 mg/hr APPLY 1 PATCH TO THE SKIN AND CHANGE DAILY  30 patch  4  . omeprazole (PRILOSEC) 20 MG capsule Take 20 mg by mouth daily.        . pravastatin (PRAVACHOL) 40 MG tablet Take 1 tablet (40 mg total) by mouth every evening.  30 tablet  5  . guaiFENesin (ROBITUSSIN) 100 MG/5ML SOLN Take 5 mLs (100 mg total) by mouth every 4 (four) hours as needed.  30 mL  0  . meclizine (ANTIVERT) 12.5 MG  tablet 1 po q 8 - 12 hours prn  30 tablet  0    Physical Exam: Filed Vitals:   12/28/11 1134  BP: 120/58  Pulse: 69  Resp: 18  Height: 5\' 10"  (1.778 m)  Weight: 211 lb 12.8 oz (96.072 kg)    GEN- The patient is well appearing, alert and oriented x 3 today.   Head- normocephalic, atraumatic Eyes-  Sclera clear, conjunctiva pink Ears- hearing intact Oropharynx- clear Lungs- Clear to ausculation bilaterally, normal work of breathing Chest- ICD pocket is well healed Heart- Regular rate and rhythm, no murmurs, rubs or gallops, PMI not laterally displaced GI- soft, NT, ND, + BS Extremities- no clubbing, cyanosis, or edema  ICD interrogation- reviewed in detail today,  See PACEART report  ekg today reveals sinus rhythm 69 bpm, PR 152, QRS , anterior infarction  Assessment and Plan:

## 2011-12-28 NOTE — Patient Instructions (Addendum)
Your physician wants you to follow-up in: 6 months with device clinic and 12 months with Dr Allred You will receive a reminder letter in the mail two months in advance. If you don't receive a letter, please call our office to schedule the follow-up appointment.  

## 2011-12-28 NOTE — Assessment & Plan Note (Signed)
euvolemic today No changes  Normal ICD function See Pace Art report No changes today  

## 2012-01-02 ENCOUNTER — Encounter: Payer: Self-pay | Admitting: Family Medicine

## 2012-01-02 ENCOUNTER — Ambulatory Visit (INDEPENDENT_AMBULATORY_CARE_PROVIDER_SITE_OTHER): Payer: Federal, State, Local not specified - PPO | Admitting: Family Medicine

## 2012-01-02 VITALS — BP 92/52 | HR 68 | Temp 97.9°F | Resp 24 | Ht 68.0 in | Wt 211.8 lb

## 2012-01-02 DIAGNOSIS — N189 Chronic kidney disease, unspecified: Secondary | ICD-10-CM

## 2012-01-02 DIAGNOSIS — J449 Chronic obstructive pulmonary disease, unspecified: Secondary | ICD-10-CM

## 2012-01-02 DIAGNOSIS — B070 Plantar wart: Secondary | ICD-10-CM

## 2012-01-02 DIAGNOSIS — I2510 Atherosclerotic heart disease of native coronary artery without angina pectoris: Secondary | ICD-10-CM

## 2012-01-02 DIAGNOSIS — E663 Overweight: Secondary | ICD-10-CM

## 2012-01-02 DIAGNOSIS — K219 Gastro-esophageal reflux disease without esophagitis: Secondary | ICD-10-CM

## 2012-01-02 DIAGNOSIS — I739 Peripheral vascular disease, unspecified: Secondary | ICD-10-CM

## 2012-01-02 DIAGNOSIS — I5022 Chronic systolic (congestive) heart failure: Secondary | ICD-10-CM

## 2012-01-02 DIAGNOSIS — E119 Type 2 diabetes mellitus without complications: Secondary | ICD-10-CM

## 2012-01-02 DIAGNOSIS — I509 Heart failure, unspecified: Secondary | ICD-10-CM

## 2012-01-02 DIAGNOSIS — I10 Essential (primary) hypertension: Secondary | ICD-10-CM

## 2012-01-02 DIAGNOSIS — N400 Enlarged prostate without lower urinary tract symptoms: Secondary | ICD-10-CM

## 2012-01-02 NOTE — Progress Notes (Signed)
  Subjective:    Patient ID: Antonio Ray, male    DOB: Nov 07, 1933, 78 y.o.   MRN: [id number]  HPI  Patient presents for routine follow up.  Patient denies recurrent vertiginous symptoms  Patient provides daily BS readings.  All fasting BS readings are normal  Legs ache after walking for period of time; symptoms resolve with rest- has follow up with Dr. Swaziland in the fall; walks daily; on lipid lowering agent  and antiplatelet therapy  Plantar wart (R) foot- pain ambulating; does have dermatology appointment tomorrow with Dr. Emily Filbert  Review of Systems     Objective:   Physical Exam  Constitutional: He appears well-developed and well-nourished.  HENT:  Mouth/Throat: Oropharynx is clear and moist.  Neck: Neck supple. No thyromegaly present.       No bruits   Cardiovascular: Normal rate, regular rhythm and normal heart sounds.   Pulmonary/Chest: Effort normal and breath sounds normal.  Abdominal: Soft. Bowel sounds are normal.       No HSM  Neurological: He is alert.  Skin:       Plantar wart (R) 1st metatarsal head      Results for orders placed in visit on 01/02/12  POCT GLYCOSYLATED HEMOGLOBIN (HGB A1C)      Component Value Range   Hemoglobin A1C 7.7      Assessment & Plan:   1. DM (diabetes mellitus)  POCT glycosylated hemoglobin (Hb A1C)  2. Plantar wart    3. Claudication    4. Coronary artery disease    5. COPD (chronic obstructive pulmonary disease)    6. Chronic systolic congestive heart failure    7. HTN (hypertension)    8. GERD (gastroesophageal reflux disease)    9. BPH (benign prostatic hyperplasia)    10. Overweight    11. CRF (chronic renal failure)      Activity encouraged particularly in light of claudication symptoms. Asked patient to schedule follow up with Dr. Swaziland over the next 4-6 weeks for arterial dopplers.  Patient to see Dr. Emily Filbert or podiatry to pair down/remove plantar wart.  Patient's A1C is creeping up.  I believe as a  result of high PP BS's LM with patients son, Antonio Ray) [phone] to increase Amaryl to 2 mg BID cautiously as he does have impaired renal function; alternatively  we can continue current dose of Amaryl and start Januvia.

## 2012-01-12 ENCOUNTER — Other Ambulatory Visit (HOSPITAL_COMMUNITY): Payer: Self-pay | Admitting: *Deleted

## 2012-01-12 MED ORDER — NITROGLYCERIN 0.2 MG/HR TD PT24: 1.0000 | MEDICATED_PATCH | Freq: Every day | TRANSDERMAL | Status: DC

## 2012-01-12 NOTE — Telephone Encounter (Signed)
Refilled NTG patches

## 2012-01-23 ENCOUNTER — Other Ambulatory Visit: Payer: Self-pay | Admitting: Physician Assistant

## 2012-01-23 ENCOUNTER — Other Ambulatory Visit: Payer: Self-pay | Admitting: Cardiology

## 2012-03-26 ENCOUNTER — Other Ambulatory Visit (HOSPITAL_COMMUNITY): Payer: Self-pay | Admitting: Family Medicine

## 2012-03-27 ENCOUNTER — Other Ambulatory Visit: Payer: Self-pay | Admitting: Physician Assistant

## 2012-03-27 ENCOUNTER — Other Ambulatory Visit: Payer: Self-pay

## 2012-03-27 MED ORDER — ALBUTEROL SULFATE HFA 108 (90 BASE) MCG/ACT IN AERS: 2.0000 | INHALATION_SPRAY | Freq: Four times a day (QID) | RESPIRATORY_TRACT | Status: DC | PRN

## 2012-04-04 ENCOUNTER — Other Ambulatory Visit: Payer: Self-pay | Admitting: Cardiology

## 2012-04-05 ENCOUNTER — Encounter: Payer: Federal, State, Local not specified - PPO | Admitting: *Deleted

## 2012-04-09 ENCOUNTER — Encounter: Payer: Self-pay | Admitting: *Deleted

## 2012-04-13 ENCOUNTER — Telehealth: Payer: Self-pay | Admitting: Internal Medicine

## 2012-04-13 NOTE — Telephone Encounter (Signed)
New Problem:    Patient received a no show letter for his last missed transmission but has a wireless device that is always transmitting.  Please call back.

## 2012-04-21 ENCOUNTER — Other Ambulatory Visit: Payer: Self-pay | Admitting: Physician Assistant

## 2012-05-16 ENCOUNTER — Ambulatory Visit (INDEPENDENT_AMBULATORY_CARE_PROVIDER_SITE_OTHER): Payer: Federal, State, Local not specified - PPO | Admitting: Emergency Medicine

## 2012-05-16 VITALS — BP 132/69 | HR 82 | Temp 98.8°F | Resp 16 | Ht 69.0 in | Wt 214.0 lb

## 2012-05-16 DIAGNOSIS — N400 Enlarged prostate without lower urinary tract symptoms: Secondary | ICD-10-CM

## 2012-05-16 DIAGNOSIS — N3090 Cystitis, unspecified without hematuria: Secondary | ICD-10-CM

## 2012-05-16 DIAGNOSIS — R300 Dysuria: Secondary | ICD-10-CM

## 2012-05-16 DIAGNOSIS — N3000 Acute cystitis without hematuria: Secondary | ICD-10-CM

## 2012-05-16 LAB — POCT UA - MICROSCOPIC ONLY
Casts, Ur, LPF, POC: NEGATIVE
Crystals, Ur, HPF, POC: NEGATIVE
Mucus, UA: NEGATIVE
Yeast, UA: NEGATIVE

## 2012-05-16 LAB — POCT URINALYSIS DIPSTICK
Bilirubin, UA: NEGATIVE
Ketones, UA: NEGATIVE
Leukocytes, UA: NEGATIVE
Protein, UA: NEGATIVE
Spec Grav, UA: 1.01
pH, UA: 5

## 2012-05-16 MED ORDER — TAMSULOSIN HCL 0.4 MG PO CAPS: 0.4000 mg | ORAL_CAPSULE | Freq: Every day | ORAL | Status: DC

## 2012-05-16 MED ORDER — SULFAMETHOXAZOLE-TRIMETHOPRIM 800-160 MG PO TABS: 1.0000 | ORAL_TABLET | Freq: Two times a day (BID) | ORAL | Status: DC

## 2012-05-16 NOTE — Progress Notes (Signed)
[facility] [address] [place] [address] [phone]  Date:  05/16/2012   Name:  Antonio Ray   DOB:  1933-08-03   MRN:  [id number]  PCP:  [name]., MD    Chief Complaint: Dysuria   History of Present Illness:  Antonio Ray is a 78 y.o. very pleasant male patient who presents with the following:  78 year old with NIDDM and sudden onset of dysuria and some increased frequency.  Has stopped taking his flomax right after it was prescribed due to concern about side effects.  Now has hesitancy, dribbling, double voiding and nocturia.  Denies fever or chills, strong urine odor or back pain  Patient Active Problem List  Diagnosis  . Coronary artery disease  . Sick sinus syndrome  . Chronic systolic congestive heart failure  . Hyperlipidemia  . PVC's (premature ventricular contractions)  . Diabetes mellitus type II, non insulin dependent  . S/P ICD (internal cardiac defibrillator) procedure  . Community acquired pneumonia  . COPD exacerbation  . COPD (chronic obstructive pulmonary disease)  . BPH (benign prostatic hyperplasia)  . GERD (gastroesophageal reflux disease)    Past Medical History  Diagnosis Date  . GERD (gastroesophageal reflux disease)   . Coronary artery disease     History of remote anterior MI with PCI to LAD in 2006; most recent cath 2007, no intervention required  . MI, old 2007    ANTERIOR  . Chronic systolic dysfunction of left ventricle     EF 30-35%, CLASS II - III SYMPTOMS; intolerant to Coreg  . Hyperlipidemia   . PVC's (premature ventricular contractions)   . Diabetes mellitus   . DOE (dyspnea on exertion)   . ICD (implantable cardiac defibrillator) in place August 2012  . Intolerance, drug     Intolerant to beta blockers  . Myocardial infarction   . Pneumonia   . CRF (chronic renal failure)   . BPH (benign prostatic hyperplasia)     Past Surgical History  Procedure Date  . Cardiac catheterization  01/11/2006    DEMONSTRATES AKINESIA OF THE DISTAL ANTERIOR WALL, DISTAL INFERIOR WALL AND AKINESIA OF THE APEX. THE BASAL SEGMENTS CONTRACT WELL AND OVERALL EF 35%  . Coronary angioplasty 1994    TO THE LAD  . Foot surgery     remote  . Cholecystectomy 2/11  . Icd implant 03/30/11    Analyze ST study patient    History  Substance Use Topics  . Smoking status: Former Smoker    Quit date: 08/01/1992  . Smokeless tobacco: Never Used  . Alcohol Use: 0.0 oz/week    .25 drink(s) per week     1 beer every 3-4 months     Family History  Problem Relation Age of Onset  . Stroke Father     Allergies  Allergen Reactions  . Carvedilol Other (See Comments)    Made dizzy  . Codeine Other (See Comments)    Gets very angry, disoriented  . Levofloxacin (Levofloxacin) Other (See Comments)    unknown  . Morphine And Related Other (See Comments)    Gets very angry, disoriented    Medication list has been reviewed and updated.  Current Outpatient Prescriptions on File Prior to Visit  Medication Sig Dispense Refill  . albuterol (PROVENTIL HFA;VENTOLIN HFA) 108 (90 BASE) MCG/ACT inhaler Inhale 2 puffs into the lungs every 6 (six) hours as needed for wheezing.  1 Inhaler  0  . aspirin 81  MG EC tablet Take 81 mg by mouth daily.        . Cholecalciferol (VITAMIN D PO) Take 1 tablet by mouth daily. Ecotrin      . fenofibrate (TRICOR) 145 MG tablet TAKE 1 TABLET BY MOUTH DAILY  30 tablet  3  . Fluticasone-Salmeterol (ADVAIR DISKUS) 250-50 MCG/DOSE AEPB Inhale 1 puff into the lungs 2 (two) times daily.  60 each  3  . FREESTYLE TEST STRIPS test strip USE TO TEST BLOOD SUGER TWICE DAILY  100 each  1  . furosemide (LASIX) 20 MG tablet TAKE 1 TABLET BY MOUTH DAILY  30 tablet  4  . glimepiride (AMARYL) 2 MG tablet TAKE 1 TABLET BY MOUTH TWICE DAILY  60 tablet  0  . losartan (COZAAR) 50 MG tablet Take 50 mg by mouth daily.        . meclizine (ANTIVERT) 12.5 MG tablet 1 po q 8 - 12 hours prn  30 tablet   0  . nitroGLYCERIN (NITRODUR - DOSED IN MG/24 HR) 0.2 mg/hr Place 1 patch (0.2 mg total) onto the skin daily.  30 patch  4  . omeprazole (PRILOSEC) 20 MG capsule Take 20 mg by mouth daily.        . pravastatin (PRAVACHOL) 40 MG tablet Take 1 tablet (40 mg total) by mouth every evening.  30 tablet  5  . albuterol (PROVENTIL,VENTOLIN) 90 MCG/ACT inhaler Inhale 2 puffs into the lungs every 4 (four) hours as needed. For shortness of breath.       . guaiFENesin (ROBITUSSIN) 100 MG/5ML SOLN Take 5 mLs (100 mg total) by mouth every 4 (four) hours as needed.  30 mL  0  . mometasone (NASONEX) 50 MCG/ACT nasal spray Place 2 sprays into the nose daily.  1 g  1  . Tamsulosin HCl (FLOMAX) 0.4 MG CAPS Take 0.4 mg by mouth. Patient really not sure how often he takes this        Review of Systems:  As per HPI, otherwise negative.    Physical Examination: Filed Vitals:   05/16/12 1029  BP: 132/69  Pulse: 82  Temp: 98.8 F (37.1 C)  Resp: 16   Filed Vitals:   05/16/12 1029  Height: 5\' 9"  (1.753 m)  Weight: 214 lb (97.07 kg)   Body mass index is 31.60 kg/(m^2). Ideal Body Weight: Weight in (lb) to have BMI = 25: 168.9   GEN: WDWN, NAD, Non-toxic, A & O x 3 HEENT: Atraumatic, Normocephalic. Neck supple. No masses, No LAD. Ears and Nose: No external deformity. CV: RRR, No M/G/R. No JVD. No thrill. No extra heart sounds. PULM: CTA B, no wheezes, crackles, rhonchi. No retractions. No resp. distress. No accessory muscle use. ABD: S, NT, ND, +BS. No rebound. No HSM. EXTR: No c/c/e NEURO Normal gait.  PSYCH: Normally interactive. Conversant. Not depressed or anxious appearing.  Calm demeanor.  BACK:  Not tender  Assessment and Plan: Dysuria BPH UA  Carmelina Dane, MD  Results for orders placed in visit on 05/16/12  POCT URINALYSIS DIPSTICK      Component Value Range   Color, UA yellow     Clarity, UA clear     Glucose, UA 500     Bilirubin, UA neg     Ketones, UA neg     Spec  Grav, UA 1.010     Blood, UA moderate     pH, UA 5.0     Protein, UA neg  Urobilinogen, UA 0.2     Nitrite, UA neg     Leukocytes, UA Negative    POCT UA - MICROSCOPIC ONLY      Component Value Range   WBC, Ur, HPF, POC 2-10 w/clums     RBC, urine, microscopic 2-8     Bacteria, U Microscopic trace     Mucus, UA neg     Epithelial cells, urine per micros 0-1     Crystals, Ur, HPF, POC neg     Casts, Ur, LPF, POC neg     Yeast, UA neg     Meds ordered this encounter  Medications  . sulfamethoxazole-trimethoprim (BACTRIM DS,SEPTRA DS) 800-160 MG per tablet    Sig: Take 1 tablet by mouth 2 (two) times daily.    Dispense:  20 tablet    Refill:  0  . DISCONTD: Tamsulosin HCl (FLOMAX) 0.4 MG CAPS    Sig: Take 1 capsule (0.4 mg total) by mouth daily. Patient really not sure how often he takes this    Dispense:  30 capsule    Refill:  12   I have reviewed and agree with documentation. Robert P. Merla Riches, M.D.

## 2012-05-19 ENCOUNTER — Ambulatory Visit: Payer: Federal, State, Local not specified - PPO

## 2012-05-19 ENCOUNTER — Ambulatory Visit (INDEPENDENT_AMBULATORY_CARE_PROVIDER_SITE_OTHER): Payer: Federal, State, Local not specified - PPO | Admitting: Family Medicine

## 2012-05-19 VITALS — BP 106/54 | HR 84 | Temp 98.4°F | Resp 20 | Ht 69.0 in | Wt 215.0 lb

## 2012-05-19 DIAGNOSIS — R0609 Other forms of dyspnea: Secondary | ICD-10-CM

## 2012-05-19 DIAGNOSIS — N390 Urinary tract infection, site not specified: Secondary | ICD-10-CM

## 2012-05-19 DIAGNOSIS — R0989 Other specified symptoms and signs involving the circulatory and respiratory systems: Secondary | ICD-10-CM

## 2012-05-19 DIAGNOSIS — R0600 Dyspnea, unspecified: Secondary | ICD-10-CM

## 2012-05-19 LAB — POCT CBC
Granulocyte percent: 83.4 %G — AB (ref 37–80)
HCT, POC: 44.5 % (ref 43.5–53.7)
Hemoglobin: 13.2 g/dL — AB (ref 14.1–18.1)
Lymph, poc: 1.7 (ref 0.6–3.4)
MCH, POC: 27.8 pg (ref 27–31.2)
MCHC: 29.7 g/dL — AB (ref 31.8–35.4)
MCV: 93.8 fL (ref 80–97)
MID (cbc): 0.7 (ref 0–0.9)
MPV: 9.6 fL (ref 0–99.8)
POC Granulocyte: 12.3 — AB (ref 2–6.9)
POC LYMPH PERCENT: 11.5 %L (ref 10–50)
POC MID %: 5.1 %M (ref 0–12)
Platelet Count, POC: 240 10*3/uL (ref 142–424)
RBC: 4.74 M/uL (ref 4.69–6.13)
RDW, POC: 13.5 %
WBC: 14.7 10*3/uL — AB (ref 4.6–10.2)

## 2012-05-19 LAB — COMPREHENSIVE METABOLIC PANEL
ALT: 29 U/L (ref 0–53)
AST: 26 U/L (ref 0–37)
Albumin: 3.4 g/dL — ABNORMAL LOW (ref 3.5–5.2)
Alkaline Phosphatase: 50 U/L (ref 39–117)
BUN: 37 mg/dL — ABNORMAL HIGH (ref 6–23)
CO2: 29 mEq/L (ref 19–32)
Calcium: 9.2 mg/dL (ref 8.4–10.5)
Chloride: 102 mEq/L (ref 96–112)
Creat: 1.89 mg/dL — ABNORMAL HIGH (ref 0.50–1.35)
Glucose, Bld: 243 mg/dL — ABNORMAL HIGH (ref 70–99)
Potassium: 4.8 mEq/L (ref 3.5–5.3)
Sodium: 138 mEq/L (ref 135–145)
Total Bilirubin: 0.5 mg/dL (ref 0.3–1.2)
Total Protein: 6 g/dL (ref 6.0–8.3)

## 2012-05-19 LAB — POCT URINALYSIS DIPSTICK
Bilirubin, UA: NEGATIVE
Blood, UA: NEGATIVE
Ketones, UA: NEGATIVE
Nitrite, UA: NEGATIVE
Protein, UA: NEGATIVE
Spec Grav, UA: 1.01
Urobilinogen, UA: 1
pH, UA: 5

## 2012-05-19 LAB — POCT UA - MICROSCOPIC ONLY
Bacteria, U Microscopic: NEGATIVE
Casts, Ur, LPF, POC: NEGATIVE
Crystals, Ur, HPF, POC: NEGATIVE
Epithelial cells, urine per micros: NEGATIVE
Mucus, UA: NEGATIVE
RBC, urine, microscopic: NEGATIVE
Yeast, UA: NEGATIVE

## 2012-05-19 NOTE — Patient Instructions (Addendum)
Do NOT take anymore Flomax Return if symptoms worsen or do not improve in 24 hours.

## 2012-05-19 NOTE — Progress Notes (Signed)
78 yo who saw Dr. Dareen Piano on Wednesday who diagnosed UTI in context of dysuria.  The latter has resolved, but he has become short of breath with just short walks.  Using inhalers which help some, but dyspnea is worsening.  One year ago, patient had pneumonia.  Some constipation this week, no nausea, vomiting or abdominal pain No chest pain or palpitations  S/P cholecystectomy  He had problem with Flomax once before (dizziness)  Objective:  No acute distress, seen with daughter HEENT:  Normal Neck:  Supple, no JVD, no adenopathy Skin:  Warm, dry, scaly lower extremities Heart: regular, no murmur or gallop.  Heart sounds muffled Chest:  Diffuse dry rales Abdomen:  Protuberant Ext:  2+ edema lower extremities [facility] reading (PRIMARY) by  Dr. Milus Glazier:  Lower lobe atelectasis, pacemaker wires in place.  Results for orders placed in visit on 05/16/12  POCT URINALYSIS DIPSTICK      Component Value Range   Color, UA yellow     Clarity, UA clear     Glucose, UA 500     Bilirubin, UA neg     Ketones, UA neg     Spec Grav, UA 1.010     Blood, UA moderate     pH, UA 5.0     Protein, UA neg     Urobilinogen, UA 0.2     Nitrite, UA neg     Leukocytes, UA Negative    POCT UA - MICROSCOPIC ONLY      Component Value Range   WBC, Ur, HPF, POC 2-10 w/clums     RBC, urine, microscopic 2-8     Bacteria, U Microscopic trace     Mucus, UA neg     Epithelial cells, urine per micros 0-1     Crystals, Ur, HPF, POC neg     Casts, Ur, LPF, POC neg     Yeast, UA neg     Results for orders placed in visit on 05/19/12  POCT URINALYSIS DIPSTICK      Component Value Range   Color, UA yellow     Clarity, UA clear     Glucose, UA 100mg /dL     Bilirubin, UA neg     Ketones, UA neg     Spec Grav, UA 1.010     Blood, UA neg     pH, UA 5.0     Protein, UA neg     Urobilinogen, UA 1.0     Nitrite, UA neg     Leukocytes, UA Trace    POCT CBC      Component Value Range   WBC 14.7 (*) 4.6 -  10.2 K/uL   Lymph, poc 1.7  0.6 - 3.4   POC LYMPH PERCENT 11.5  10 - 50 %L   MID (cbc) 0.7  0 - 0.9   POC MID % 5.1  0 - 12 %M   POC Granulocyte 12.3 (*) 2 - 6.9   Granulocyte percent 83.4 (*) 37 - 80 %G   RBC 4.74  4.69 - 6.13 M/uL   Hemoglobin 13.2 (*) 14.1 - 18.1 g/dL   HCT, POC 21.3  08.6 - 53.7 %   MCV 93.8  80 - 97 fL   MCH, POC 27.8  27 - 31.2 pg   MCHC 29.7 (*) 31.8 - 35.4 g/dL   RDW, POC 57.8     Platelet Count, POC 240  142 - 424 K/uL   MPV 9.6  0 - 99.8 fL  Assessment:  The symptoms are temporally related to the Flomax and he had problems with it in the past.  The chest x-ray does not account for the shortness of breath or the low pulse oximetry 1. DOE (dyspnea on exertion)  DG Chest 2 View, POCT CBC  2. UTI (lower urinary tract infection)  POCT UA - Microscopic Only, POCT urinalysis dipstick, Comprehensive metabolic panel

## 2012-05-23 ENCOUNTER — Encounter: Payer: Self-pay | Admitting: Family Medicine

## 2012-05-23 ENCOUNTER — Ambulatory Visit (INDEPENDENT_AMBULATORY_CARE_PROVIDER_SITE_OTHER): Payer: Federal, State, Local not specified - PPO | Admitting: Family Medicine

## 2012-05-23 VITALS — BP 92/50 | HR 73 | Temp 98.1°F | Resp 16 | Ht 68.0 in | Wt 208.0 lb

## 2012-05-23 DIAGNOSIS — R0602 Shortness of breath: Secondary | ICD-10-CM

## 2012-05-23 DIAGNOSIS — N390 Urinary tract infection, site not specified: Secondary | ICD-10-CM

## 2012-05-23 DIAGNOSIS — J683 Other acute and subacute respiratory conditions due to chemicals, gases, fumes and vapors: Secondary | ICD-10-CM

## 2012-05-23 LAB — POCT URINALYSIS DIPSTICK
Bilirubin, UA: NEGATIVE
Blood, UA: NEGATIVE
Glucose, UA: NEGATIVE
Ketones, UA: NEGATIVE
Nitrite, UA: NEGATIVE
Protein, UA: NEGATIVE
Spec Grav, UA: 1.02
Urobilinogen, UA: 0.2
pH, UA: 5

## 2012-05-23 LAB — POCT UA - MICROSCOPIC ONLY
Crystals, Ur, HPF, POC: NEGATIVE
Mucus, UA: POSITIVE
Yeast, UA: NEGATIVE

## 2012-05-23 LAB — IFOBT (OCCULT BLOOD): IFOBT: NEGATIVE

## 2012-05-23 MED ORDER — DOXYCYCLINE HYCLATE 100 MG PO TABS: 100.0000 mg | ORAL_TABLET | Freq: Two times a day (BID) | ORAL | Status: DC

## 2012-05-23 MED ORDER — ALBUTEROL SULFATE HFA 108 (90 BASE) MCG/ACT IN AERS: 2.0000 | INHALATION_SPRAY | Freq: Four times a day (QID) | RESPIRATORY_TRACT | Status: DC | PRN

## 2012-05-23 NOTE — Progress Notes (Signed)
78 yo here for follow up of dyspnea and UTI.  He stopped the sulfa and his dyspnea is largely resolved.  He did have polyuria last night, but his blood sugars have been in the mid-100's.  Objective:  NAD Heart:  Reg, no murmur Chest:  clear Results for orders placed in visit on 05/19/12  POCT UA - MICROSCOPIC ONLY      Component Value Range   WBC, Ur, HPF, POC 0-5     RBC, urine, microscopic neg     Bacteria, U Microscopic neg     Mucus, UA neg     Epithelial cells, urine per micros neg     Crystals, Ur, HPF, POC neg     Casts, Ur, LPF, POC neg     Yeast, UA neg    POCT URINALYSIS DIPSTICK      Component Value Range   Color, UA yellow     Clarity, UA clear     Glucose, UA 100mg /dL     Bilirubin, UA neg     Ketones, UA neg     Spec Grav, UA 1.010     Blood, UA neg     pH, UA 5.0     Protein, UA neg     Urobilinogen, UA 1.0     Nitrite, UA neg     Leukocytes, UA Trace    POCT CBC      Component Value Range   WBC 14.7 (*) 4.6 - 10.2 K/uL   Lymph, poc 1.7  0.6 - 3.4   POC LYMPH PERCENT 11.5  10 - 50 %L   MID (cbc) 0.7  0 - 0.9   POC MID % 5.1  0 - 12 %M   POC Granulocyte 12.3 (*) 2 - 6.9   Granulocyte percent 83.4 (*) 37 - 80 %G   RBC 4.74  4.69 - 6.13 M/uL   Hemoglobin 13.2 (*) 14.1 - 18.1 g/dL   HCT, POC 16.1  09.6 - 53.7 %   MCV 93.8  80 - 97 fL   MCH, POC 27.8  27 - 31.2 pg   MCHC 29.7 (*) 31.8 - 35.4 g/dL   RDW, POC 04.5     Platelet Count, POC 240  142 - 424 K/uL   MPV 9.6  0 - 99.8 fL  COMPREHENSIVE METABOLIC PANEL      Component Value Range   Sodium 138  135 - 145 mEq/L   Potassium 4.8  3.5 - 5.3 mEq/L   Chloride 102  96 - 112 mEq/L   CO2 29  19 - 32 mEq/L   Glucose, Bld 243 (*) 70 - 99 mg/dL   BUN 37 (*) 6 - 23 mg/dL   Creat 4.09 (*) 8.11 - 1.35 mg/dL   Total Bilirubin 0.5  0.3 - 1.2 mg/dL   Alkaline Phosphatase 50  39 - 117 U/L   AST 26  0 - 37 U/L   ALT 29  0 - 53 U/L   Total Protein 6.0  6.0 - 8.3 g/dL   Albumin 3.4 (*) 3.5 - 5.2 g/dL   Calcium  9.2  8.4 - 91.4 mg/dL   Results for orders placed in visit on 05/23/12  POCT UA - MICROSCOPIC ONLY      Component Value Range   WBC, Ur, HPF, POC 8-32 clumps     RBC, urine, microscopic 3-6     Bacteria, U Microscopic 1+     Mucus, UA positive     Epithelial  cells, urine per micros 1-22     Crystals, Ur, HPF, POC neg     Casts, Ur, LPF, POC granular     Yeast, UA neg     Casts hylaine    POCT URINALYSIS DIPSTICK      Component Value Range   Color, UA yellow     Clarity, UA clear     Glucose, UA neg     Bilirubin, UA neg     Ketones, UA neg     Spec Grav, UA 1.020     Blood, UA neg     pH, UA 5.0     Protein, UA neg     Urobilinogen, UA 0.2     Nitrite, UA neg     Leukocytes, UA Trace    Prostate enlarged, firm with no hard nodules.   Assessment:  Continued pyuria without symptoms.    Plan:  \ 1. UTI (lower urinary tract infection)  POCT UA - Microscopic Only, POCT urinalysis dipstick, Urine culture, doxycycline (VIBRA-TABS) 100 MG tablet, IFOBT POC (occult bld, rslt in office)  2. Reactive airways dysfunction syndrome  albuterol (PROVENTIL HFA;VENTOLIN HFA) 108 (90 BASE) MCG/ACT inhaler

## 2012-05-23 NOTE — Patient Instructions (Addendum)
Return in two weeks.  

## 2012-05-25 LAB — URINE CULTURE: Colony Count: 2000

## 2012-05-29 ENCOUNTER — Telehealth: Payer: Self-pay

## 2012-05-29 MED ORDER — GLIMEPIRIDE 2 MG PO TABS: 2.0000 mg | ORAL_TABLET | Freq: Two times a day (BID) | ORAL | Status: DC

## 2012-05-29 NOTE — Telephone Encounter (Signed)
Called patient advised Rx sent in.

## 2012-05-29 NOTE — Telephone Encounter (Signed)
Patient says he called pharmacy last week to refill glimepiride and they are waiting on an authorization from [place] to refill.  [id number] - [place], [place] - [address] AT [facility] [address] & [address] [phone]

## 2012-06-06 ENCOUNTER — Ambulatory Visit (INDEPENDENT_AMBULATORY_CARE_PROVIDER_SITE_OTHER): Payer: Federal, State, Local not specified - PPO | Admitting: Family Medicine

## 2012-06-06 VITALS — BP 100/60 | HR 81 | Temp 97.5°F | Resp 18 | Wt 209.0 lb

## 2012-06-06 DIAGNOSIS — N390 Urinary tract infection, site not specified: Secondary | ICD-10-CM

## 2012-06-06 DIAGNOSIS — R42 Dizziness and giddiness: Secondary | ICD-10-CM

## 2012-06-06 DIAGNOSIS — J45909 Unspecified asthma, uncomplicated: Secondary | ICD-10-CM

## 2012-06-06 LAB — POCT URINALYSIS DIPSTICK
Bilirubin, UA: NEGATIVE
Blood, UA: NEGATIVE
Glucose, UA: NEGATIVE
Ketones, UA: NEGATIVE
Leukocytes, UA: NEGATIVE
Nitrite, UA: NEGATIVE
Protein, UA: NEGATIVE
Spec Grav, UA: 1.01
Urobilinogen, UA: 0.2
pH, UA: 5.5

## 2012-06-06 LAB — POCT UA - MICROSCOPIC ONLY
Bacteria, U Microscopic: NEGATIVE
Casts, Ur, LPF, POC: NEGATIVE
Crystals, Ur, HPF, POC: NEGATIVE
Mucus, UA: NEGATIVE
RBC, urine, microscopic: NEGATIVE
WBC, Ur, HPF, POC: NEGATIVE
Yeast, UA: NEGATIVE

## 2012-06-06 NOTE — Progress Notes (Signed)
S:  UTI follow up:  No urinary symptoms.  Generally gets up once a night to void.   No hematuria, dysuria, frequency now Continues to work second shift  Breathing is doing well.  Has had flu shot already  Glucose 125-155  Some dizziness occasionally with Cipro  Objective:  NAD Normal respiratons  Results for orders placed in visit on 06/06/12  POCT URINALYSIS DIPSTICK      Component Value Range   Color, UA yellow     Clarity, UA clear     Glucose, UA neg     Bilirubin, UA neg     Ketones, UA neg     Spec Grav, UA 1.010     Blood, UA neg     pH, UA 5.5     Protein, UA neg     Urobilinogen, UA 0.2     Nitrite, UA neg     Leukocytes, UA Negative    POCT UA - MICROSCOPIC ONLY      Component Value Range   WBC, Ur, HPF, POC neg     RBC, urine, microscopic neg     Bacteria, U Microscopic neg     Mucus, UA neg     Epithelial cells, urine per micros 0-2     Crystals, Ur, HPF, POC neg     Casts, Ur, LPF, POC neg     Yeast, UA neg     Assessment:  Resolved UTI,  RAD well-controlled  Plan:  Follow-up prn

## 2012-06-08 ENCOUNTER — Encounter: Payer: Self-pay | Admitting: Cardiology

## 2012-06-08 ENCOUNTER — Other Ambulatory Visit: Payer: Self-pay

## 2012-06-08 ENCOUNTER — Other Ambulatory Visit: Payer: Self-pay | Admitting: *Deleted

## 2012-06-08 ENCOUNTER — Ambulatory Visit (INDEPENDENT_AMBULATORY_CARE_PROVIDER_SITE_OTHER): Payer: Federal, State, Local not specified - PPO | Admitting: Cardiology

## 2012-06-08 VITALS — BP 116/62 | HR 68 | Ht 68.0 in | Wt 205.0 lb

## 2012-06-08 DIAGNOSIS — I2510 Atherosclerotic heart disease of native coronary artery without angina pectoris: Secondary | ICD-10-CM

## 2012-06-08 DIAGNOSIS — E119 Type 2 diabetes mellitus without complications: Secondary | ICD-10-CM

## 2012-06-08 DIAGNOSIS — E785 Hyperlipidemia, unspecified: Secondary | ICD-10-CM

## 2012-06-08 DIAGNOSIS — I5022 Chronic systolic (congestive) heart failure: Secondary | ICD-10-CM

## 2012-06-08 DIAGNOSIS — N183 Chronic kidney disease, stage 3 unspecified: Secondary | ICD-10-CM

## 2012-06-08 DIAGNOSIS — I509 Heart failure, unspecified: Secondary | ICD-10-CM

## 2012-06-08 HISTORY — DX: Chronic kidney disease, stage 3 unspecified: N18.30

## 2012-06-08 LAB — BASIC METABOLIC PANEL
CO2: 30 mEq/L (ref 19–32)
Chloride: 102 mEq/L (ref 96–112)
Creatinine, Ser: 1.5 mg/dL (ref 0.4–1.5)
Sodium: 138 mEq/L (ref 135–145)

## 2012-06-08 LAB — LIPID PANEL
Cholesterol: 134 mg/dL (ref 0–200)
HDL: 37.5 mg/dL — ABNORMAL LOW (ref >39.00)
LDL Cholesterol: 62 mg/dL (ref 0–99)
Total CHOL/HDL Ratio: 4
Triglycerides: 172 mg/dL — ABNORMAL HIGH (ref 0.0–149.0)

## 2012-06-08 NOTE — Addendum Note (Signed)
Addended by: Tonita Phoenix on: 06/08/2012 09:38 AM   Modules accepted: Orders

## 2012-06-08 NOTE — Addendum Note (Signed)
Addended by: Tonita Phoenix on: 06/08/2012 10:02 AM   Modules accepted: Orders

## 2012-06-08 NOTE — Addendum Note (Signed)
Addended by: Tonita Phoenix on: 06/08/2012 09:37 AM   Modules accepted: Orders

## 2012-06-08 NOTE — Progress Notes (Signed)
Antonio Ray Date of Birth: Jul 02, 1934 Medical Record #[id number]  History of Present Illness: [name] is seen back today for a followup visit. He states he is doing well from a cardiac standpoint. He's had no recent chest pain, shortness of breath, or palpiations. He's had no increase in edema. His weight is down 3 pounds.He has experienced some heartburn that is easily relieved with drinking ginger ale. He was treated recently for urinary tract infection. It was noted at that time that his creatinine had increased. He generally feels quite well.   Current Outpatient Prescriptions on File Prior to Visit  Medication Sig Dispense Refill  . aspirin 81 MG EC tablet Take 81 mg by mouth daily.        . Cholecalciferol (VITAMIN D PO) Take 1 tablet by mouth daily. Ecotrin      . fenofibrate (TRICOR) 145 MG tablet TAKE 1 TABLET BY MOUTH DAILY  30 tablet  3  . Fluticasone-Salmeterol (ADVAIR DISKUS) 250-50 MCG/DOSE AEPB Inhale 1 puff into the lungs 2 (two) times daily.  60 each  3  . FREESTYLE TEST STRIPS test strip USE TO TEST BLOOD SUGER TWICE DAILY  100 each  1  . furosemide (LASIX) 20 MG tablet TAKE 1 TABLET BY MOUTH DAILY  30 tablet  4  . losartan (COZAAR) 50 MG tablet Take 50 mg by mouth daily.        . meclizine (ANTIVERT) 12.5 MG tablet 1 po q 8 - 12 hours prn  30 tablet  0  . nitroGLYCERIN (NITRODUR - DOSED IN MG/24 HR) 0.2 mg/hr Place 1 patch (0.2 mg total) onto the skin daily.  30 patch  4  . pravastatin (PRAVACHOL) 40 MG tablet Take 1 tablet (40 mg total) by mouth every evening.  30 tablet  5    Allergies  Allergen Reactions  . Carvedilol Other (See Comments)    Made dizzy  . Codeine Other (See Comments)    Gets very angry, disoriented  . Flomax (Tamsulosin Hcl)     Shortness of breath  . Levofloxacin (Levofloxacin) Other (See Comments)    unknown  . Morphine And Related Other (See Comments)    Gets very angry, disoriented  . Sulfa Antibiotics     Shortness of breath     Past Medical History  Diagnosis Date  . GERD (gastroesophageal reflux disease)   . Coronary artery disease     History of remote anterior MI with PCI to LAD in 2006; most recent cath 2007, no intervention required  . MI, old 2007    ANTERIOR  . Chronic systolic dysfunction of left ventricle     EF 30-35%, CLASS II - III SYMPTOMS; intolerant to Coreg  . Hyperlipidemia   . PVC's (premature ventricular contractions)   . Diabetes mellitus   . DOE (dyspnea on exertion)   . ICD (implantable cardiac defibrillator) in place August 2012  . Intolerance, drug     Intolerant to beta blockers  . Myocardial infarction   . Pneumonia   . CRF (chronic renal failure)   . BPH (benign prostatic hyperplasia)   . CKD (chronic kidney disease) stage 3, GFR 30-59 ml/min 06/08/2012    Past Surgical History  Procedure Date  . Cardiac catheterization 01/11/2006    DEMONSTRATES AKINESIA OF THE DISTAL ANTERIOR WALL, DISTAL INFERIOR WALL AND AKINESIA OF THE APEX. THE BASAL SEGMENTS CONTRACT WELL AND OVERALL EF 35%  . Coronary angioplasty 1994    TO THE LAD  .  Foot surgery     remote  . Cholecystectomy 2/11  . Icd implant 03/30/11    Analyze ST study patient    History  Smoking status  . Former Smoker  . Quit date: 08/01/1992  Smokeless tobacco  . Never Used    History  Alcohol Use  . 0.0 oz/week  . Marland Kitchen25 drink(s) per week    Comment: 1 beer every 3-4 months     Family History  Problem Relation Age of Onset  . Stroke Father     Review of Systems: The review of systems is positive for some difficulty initiating urination and decreased urinary stream. In the past he developed lightheadedness on Flomax.  All other systems were reviewed and are negative.  Physical Exam: BP 116/62  Pulse 68  Ht 5\' 8"  (1.727 m)  Wt 205 lb (92.987 kg)  BMI 31.17 kg/m2  SpO2 93% Patient is very pleasant and in no acute distress. Skin is warm and dry. Color is normal.  HEENT is unremarkable.  Normocephalic/atraumatic. PERRL. Sclera are nonicteric. Neck is supple. No masses. No JVD. Lungs are clear. Cardiac exam shows a regular rate and rhythm. Abdomen is soft and obese. No masses or bruits . Extremities are without edema.  pedal pulses are good. Gait and ROM are intact. No gross neurologic deficits noted.   LABORATORY DATA:   Assessment / Plan: 1. Coronary disease with remote anterior myocardial infarction in 1994. His last stress test in October 2011 showed no ischemia. We will continue with his current medical therapy including aspirin and isosorbide. He has a history of intolerance to carvedilol because of dizziness.  2. Ischemic cardiomyopathy with chronic systolic CHF. He is asymptomatic. He appears to be euvolemic. We'll continue with his current diuretic dose and ARB.  3. Prophylactic ICD.  4. Hyperlipidemia. We'll check a fasting lipid panel today.  5. Chronic kidney disease stage III. Recent increase in creatinine was probably related to his UTI. We will repeat a renal panel today.

## 2012-06-08 NOTE — Patient Instructions (Signed)
We will check your kidney function and lipids today.  Continue your current medication  I will see you again in 6 months.

## 2012-06-13 ENCOUNTER — Other Ambulatory Visit: Payer: Self-pay

## 2012-06-13 MED ORDER — NITROGLYCERIN 0.2 MG/HR TD PT24: 1.0000 | MEDICATED_PATCH | Freq: Every day | TRANSDERMAL | Status: DC

## 2012-06-14 ENCOUNTER — Other Ambulatory Visit: Payer: Self-pay | Admitting: Dermatology

## 2012-06-26 ENCOUNTER — Other Ambulatory Visit: Payer: Self-pay

## 2012-06-26 DIAGNOSIS — E7800 Pure hypercholesterolemia, unspecified: Secondary | ICD-10-CM

## 2012-06-26 MED ORDER — FUROSEMIDE 20 MG PO TABS: 20.0000 mg | ORAL_TABLET | Freq: Every day | ORAL | Status: DC

## 2012-06-26 MED ORDER — PRAVASTATIN SODIUM 40 MG PO TABS: 40.0000 mg | ORAL_TABLET | Freq: Every evening | ORAL | Status: DC

## 2012-07-06 ENCOUNTER — Encounter: Payer: Self-pay | Admitting: *Deleted

## 2012-07-06 DIAGNOSIS — Z95810 Presence of automatic (implantable) cardiac defibrillator: Secondary | ICD-10-CM | POA: Insufficient documentation

## 2012-07-12 ENCOUNTER — Ambulatory Visit (INDEPENDENT_AMBULATORY_CARE_PROVIDER_SITE_OTHER): Payer: Federal, State, Local not specified - PPO | Admitting: *Deleted

## 2012-07-12 ENCOUNTER — Encounter: Payer: Self-pay | Admitting: Internal Medicine

## 2012-07-12 DIAGNOSIS — I509 Heart failure, unspecified: Secondary | ICD-10-CM

## 2012-07-12 DIAGNOSIS — I495 Sick sinus syndrome: Secondary | ICD-10-CM

## 2012-07-12 DIAGNOSIS — I5022 Chronic systolic (congestive) heart failure: Secondary | ICD-10-CM

## 2012-07-12 LAB — ICD DEVICE OBSERVATION
AL IMPEDENCE ICD: 437.5 Ohm
AL THRESHOLD: 1 V
DEV-0020ICD: NEGATIVE
FVT: 0
MODE SWITCH EPISODES: 1
PACEART VT: 0
RV LEAD AMPLITUDE: 12 mv
RV LEAD IMPEDENCE ICD: 487.5 Ohm
RV LEAD THRESHOLD: 0.5 V
TOT-0006: 20120829000000
TOT-0009: 1
TOT-0010: 5

## 2012-07-12 NOTE — Progress Notes (Signed)
ICD check by industry for research 

## 2012-07-26 ENCOUNTER — Other Ambulatory Visit: Payer: Self-pay | Admitting: Physician Assistant

## 2012-08-02 ENCOUNTER — Other Ambulatory Visit: Payer: Self-pay | Admitting: *Deleted

## 2012-08-02 ENCOUNTER — Other Ambulatory Visit: Payer: Self-pay

## 2012-08-02 MED ORDER — FENOFIBRATE 145 MG PO TABS: 145.0000 mg | ORAL_TABLET | Freq: Every day | ORAL | Status: DC

## 2012-08-02 MED ORDER — LOSARTAN POTASSIUM 50 MG PO TABS: 50.0000 mg | ORAL_TABLET | Freq: Every day | ORAL | Status: DC

## 2012-08-26 ENCOUNTER — Telehealth: Payer: Self-pay | Admitting: *Deleted

## 2012-08-26 NOTE — Telephone Encounter (Signed)
[facility] requesting refill on omeprazole 20mg . Last fill 05/24/12

## 2012-08-27 MED ORDER — OMEPRAZOLE 20 MG PO CPDR: 20.0000 mg | DELAYED_RELEASE_CAPSULE | Freq: Every day | ORAL | Status: DC

## 2012-08-27 NOTE — Telephone Encounter (Signed)
Rx done and sent to pharmacy 

## 2012-09-08 ENCOUNTER — Other Ambulatory Visit: Payer: Self-pay | Admitting: Physician Assistant

## 2012-09-17 ENCOUNTER — Ambulatory Visit (INDEPENDENT_AMBULATORY_CARE_PROVIDER_SITE_OTHER): Payer: Federal, State, Local not specified - PPO | Admitting: *Deleted

## 2012-09-17 ENCOUNTER — Encounter (INDEPENDENT_AMBULATORY_CARE_PROVIDER_SITE_OTHER): Payer: Federal, State, Local not specified - PPO

## 2012-09-17 DIAGNOSIS — I495 Sick sinus syndrome: Secondary | ICD-10-CM

## 2012-09-17 DIAGNOSIS — R0989 Other specified symptoms and signs involving the circulatory and respiratory systems: Secondary | ICD-10-CM

## 2012-09-17 DIAGNOSIS — Z95810 Presence of automatic (implantable) cardiac defibrillator: Secondary | ICD-10-CM

## 2012-09-17 DIAGNOSIS — I2589 Other forms of chronic ischemic heart disease: Secondary | ICD-10-CM

## 2012-09-28 ENCOUNTER — Other Ambulatory Visit: Payer: Self-pay | Admitting: Internal Medicine

## 2012-09-28 ENCOUNTER — Encounter: Payer: Self-pay | Admitting: Internal Medicine

## 2012-09-28 DIAGNOSIS — I428 Other cardiomyopathies: Secondary | ICD-10-CM

## 2012-09-28 LAB — ICD DEVICE OBSERVATION
AL IMPEDENCE ICD: 440 Ohm
AL THRESHOLD: 1 V
BAMS-0001: 170 {beats}/min
BAMS-0003: 60 {beats}/min
CHARGE TIME: 8.9 s
DEV-0020ICD: NEGATIVE
RV LEAD IMPEDENCE ICD: 480 Ohm
RV LEAD THRESHOLD: 0.5 V

## 2012-09-28 NOTE — Progress Notes (Signed)
icd check in clinic by research  

## 2012-10-23 ENCOUNTER — Other Ambulatory Visit: Payer: Self-pay | Admitting: Physician Assistant

## 2012-11-15 ENCOUNTER — Ambulatory Visit (INDEPENDENT_AMBULATORY_CARE_PROVIDER_SITE_OTHER): Payer: Federal, State, Local not specified - PPO | Admitting: Internal Medicine

## 2012-11-15 ENCOUNTER — Encounter: Payer: Self-pay | Admitting: Internal Medicine

## 2012-11-15 VITALS — BP 115/64 | HR 77 | Ht 70.0 in | Wt 204.6 lb

## 2012-11-15 DIAGNOSIS — I2510 Atherosclerotic heart disease of native coronary artery without angina pectoris: Secondary | ICD-10-CM

## 2012-11-15 DIAGNOSIS — I5022 Chronic systolic (congestive) heart failure: Secondary | ICD-10-CM

## 2012-11-15 DIAGNOSIS — I509 Heart failure, unspecified: Secondary | ICD-10-CM

## 2012-11-15 LAB — ICD DEVICE OBSERVATION
AL AMPLITUDE: 1.6 mv
AL IMPEDENCE ICD: 437.5 Ohm
BAMS-0001: 170 {beats}/min
HV IMPEDENCE: 87 Ohm
TOT-0006: 20120829000000
TOT-0007: 1
TOT-0008: 0
VENTRICULAR PACING ICD: 0.48 pct
VF: 0

## 2012-11-15 NOTE — Progress Notes (Signed)
PCP: Antonio Ray., MD Primary Cardiologist:  Dr Antonio Ray  [name] is a 79 y.o. male who presents today for routine electrophysiology followup.  Since last being seen in our clinic, the patient reports doing very well.  Today, he denies symptoms of palpitations, chest pain, shortness of breath,  lower extremity edema, presyncope, syncope, or ICD shocks.  The patient is otherwise without complaint today.   Past Medical History  Diagnosis Date  . GERD (gastroesophageal reflux disease)   . Coronary artery disease     History of remote anterior MI with PCI to LAD in 2006; most recent cath 2007, no intervention required  . MI, old 2007    ANTERIOR  . Chronic systolic dysfunction of left ventricle     EF 30-35%, CLASS II - III SYMPTOMS; intolerant to Coreg  . Hyperlipidemia   . PVC's (premature ventricular contractions)   . Diabetes mellitus   . DOE (dyspnea on exertion)   . ICD (implantable cardiac defibrillator) in place August 2012  . Intolerance, drug     Intolerant to beta blockers  . Myocardial infarction   . Pneumonia   . CRF (chronic renal failure)   . BPH (benign prostatic hyperplasia)   . CKD (chronic kidney disease) stage 3, GFR 30-59 ml/min 06/08/2012   Past Surgical History  Procedure Laterality Date  . Cardiac catheterization  01/11/2006    DEMONSTRATES AKINESIA OF THE DISTAL ANTERIOR WALL, DISTAL INFERIOR WALL AND AKINESIA OF THE APEX. THE BASAL SEGMENTS CONTRACT WELL AND OVERALL EF 35%  . Coronary angioplasty  1994    TO THE LAD  . Foot surgery      remote  . Cholecystectomy  2/11  . Icd implant  03/30/11    Analyze ST study patient    Current Outpatient Prescriptions  Medication Sig Dispense Refill  . aspirin 81 MG EC tablet Take 81 mg by mouth daily.        . Cholecalciferol (VITAMIN D PO) Take 2,000 Units by mouth daily. Ecotrin      . fenofibrate (TRICOR) 145 MG tablet Take 1 tablet (145 mg total) by mouth daily.  30 tablet  3  . Fluticasone-Salmeterol  (ADVAIR DISKUS) 250-50 MCG/DOSE AEPB Inhale 1 puff into the lungs 2 (two) times daily.  60 each  3  . FREESTYLE TEST STRIPS test strip USE TO TEST BLOOD SUGER TWICE DAILY  100 each  1  . furosemide (LASIX) 20 MG tablet Take 1 tablet (20 mg total) by mouth daily.  30 tablet  11  . glimepiride (AMARYL) 2 MG tablet TAKE 1 TABLET BY MOUTH TWICE DAILY  60 tablet  0  . losartan (COZAAR) 50 MG tablet Take 1 tablet (50 mg total) by mouth daily.  30 tablet  2  . nitroGLYCERIN (NITRODUR - DOSED IN MG/24 HR) 0.2 mg/hr Place 1 patch (0.2 mg total) onto the skin daily.  30 patch  4  . omeprazole (PRILOSEC) 20 MG capsule TAKE 1 CAPSULE BY MOUTH DAILY  30 capsule  1  . pravastatin (PRAVACHOL) 40 MG tablet Take 1 tablet (40 mg total) by mouth every evening.  30 tablet  11  . tamsulosin (FLOMAX) 0.4 MG CAPS Take 0.4 mg by mouth daily.       No current facility-administered medications for this visit.    Physical Exam: Filed Vitals:   11/15/12 1520  BP: 115/64  Pulse: 77  Height: 5\' 10"  (1.778 m)  Weight: 204 lb 9.6 oz (92.806 kg)  GEN- The patient is well appearing, alert and oriented x 3 today.   Head- normocephalic, atraumatic Eyes-  Sclera clear, conjunctiva pink Ears- hearing intact Oropharynx- clear Lungs- Clear to ausculation bilaterally, normal work of breathing Chest- ICD pocket is well healed Heart- Regular rate and rhythm, no murmurs, rubs or gallops, PMI not laterally displaced GI- soft, NT, ND, + BS Extremities- no clubbing, cyanosis, or edema  ICD interrogation- reviewed in detail today,  See PACEART report  ekg today reveals sinus rhythm  anterior infarction  Assessment and Plan:   1. Chronic systolic dysfunction Stable No change required today Normal ICD function See Pace Art report No changes today  2. CAD No ischemic symptoms  Return to see Antonio Ray in 4 months I will see again in 10 months

## 2012-11-15 NOTE — Patient Instructions (Addendum)
Your physician recommends that you schedule a follow-up appointment in: 4 months with Rick Duff, PA   Your physician wants you to follow-up in: 10 mths with Dr Johney Frame. You will receive a reminder letter in the mail two months in advance. If you don't receive a letter, please call our office to schedule the follow-up appointment.

## 2012-11-21 ENCOUNTER — Other Ambulatory Visit: Payer: Self-pay

## 2012-11-21 MED ORDER — NITROGLYCERIN 0.2 MG/HR TD PT24: 1.0000 | MEDICATED_PATCH | Freq: Every day | TRANSDERMAL | Status: DC

## 2012-11-21 NOTE — Telephone Encounter (Signed)
Patient Instructions    We will check your kidney function and lipids today.  Continue your current medication  I will see you again in 6 months.  Chart Reviewed By    Peter M [place], MD  on 06/08/2012  9:26 AM    Charna Elizabeth, LPN  on 41/12/6061 10:18 AM    Elvina Sidle, MD  on 06/08/2012  4:27 PM      Provider Department Encounter #    06/06/2012 10:15 AM Peter [place], MD [facility] [id number]

## 2012-12-07 ENCOUNTER — Other Ambulatory Visit: Payer: Self-pay

## 2012-12-07 MED ORDER — FENOFIBRATE 145 MG PO TABS: 145.0000 mg | ORAL_TABLET | Freq: Every day | ORAL | Status: DC

## 2012-12-14 ENCOUNTER — Encounter: Payer: Self-pay | Admitting: Cardiology

## 2012-12-17 ENCOUNTER — Encounter: Payer: Self-pay | Admitting: Cardiology

## 2012-12-22 ENCOUNTER — Other Ambulatory Visit: Payer: Self-pay | Admitting: Family Medicine

## 2013-01-17 ENCOUNTER — Encounter: Payer: Self-pay | Admitting: Internal Medicine

## 2013-01-20 ENCOUNTER — Other Ambulatory Visit: Payer: Self-pay | Admitting: Physician Assistant

## 2013-01-29 ENCOUNTER — Other Ambulatory Visit: Payer: Self-pay | Admitting: Physician Assistant

## 2013-02-18 NOTE — Progress Notes (Signed)
ELECTROPHYSIOLOGY OFFICE NOTE  Patient ID: DIANTE BARLEY MRN: [id number], DOB/AGE: May 03, 1934   Date of Visit: 02/20/2103  Primary Physician: [name]., MD Primary Cardiologist / EP: [place], MD / Johney Frame, MD Reason for Visit: EP/device follow-up  History of Present Illness  Antonio Ray is a 79 y.o. male with an ischemic CM s/p ICD implant, chronic systolic HF, CAD, DM and CKD who presents today for routine electrophysiology followup. Since last being seen in our clinic, he reports he is doing well and has no complaints. He denies chest pain or shortness of breath. He denies palpitations, dizziness, near syncope or syncope. He denies LE swelling, orthopnea, PND or recent weight gain. He is compliant and tolerating medications without difficulty.  Past Medical History Past Medical History  Diagnosis Date  . GERD (gastroesophageal reflux disease)   . Coronary artery disease     History of remote anterior MI with PCI to LAD in 2006; most recent cath 2007, no intervention required  . MI, old 2007    ANTERIOR  . Chronic systolic dysfunction of left ventricle     EF 30-35%, CLASS II - III SYMPTOMS; intolerant to Coreg  . Hyperlipidemia   . PVC's (premature ventricular contractions)   . Diabetes mellitus   . DOE (dyspnea on exertion)   . ICD (implantable cardiac defibrillator) in place August 2012  . Intolerance, drug     Intolerant to beta blockers  . Myocardial infarction   . Pneumonia   . CRF (chronic renal failure)   . BPH (benign prostatic hyperplasia)   . CKD (chronic kidney disease) stage 3, GFR 30-59 ml/min 06/08/2012    Past Surgical History Past Surgical History  Procedure Laterality Date  . Cardiac catheterization  01/11/2006    DEMONSTRATES AKINESIA OF THE DISTAL ANTERIOR WALL, DISTAL INFERIOR WALL AND AKINESIA OF THE APEX. THE BASAL SEGMENTS CONTRACT WELL AND OVERALL EF 35%  . Coronary angioplasty  1994    TO THE LAD  . Foot surgery      remote  .  Cholecystectomy  2/11  . Icd implant  03/30/11    Analyze ST study patient    Allergies/Intolerances Allergies  Allergen Reactions  . Carvedilol Other (See Comments)    Made dizzy  . Codeine Other (See Comments)    Gets very angry, disoriented  . Flomax (Tamsulosin Hcl)     Shortness of breath  . Levofloxacin (Levofloxacin) Other (See Comments)    unknown  . Morphine And Related Other (See Comments)    Gets very angry, disoriented  . Sulfa Antibiotics     Shortness of breath   Current Home Medications Current Outpatient Prescriptions  Medication Sig Dispense Refill  . aspirin 81 MG EC tablet Take 81 mg by mouth daily.        . Cholecalciferol (VITAMIN D PO) Take 2,000 Units by mouth daily. Ecotrin      . fenofibrate (TRICOR) 145 MG tablet Take 1 tablet (145 mg total) by mouth daily.  30 tablet  5  . Fluticasone-Salmeterol (ADVAIR DISKUS) 250-50 MCG/DOSE AEPB Inhale 1 puff into the lungs 2 (two) times daily.  60 each  3  . FREESTYLE TEST STRIPS test strip USE TO TEST BLOOD SUGER TWICE DAILY  100 each  1  . furosemide (LASIX) 20 MG tablet Take 1 tablet (20 mg total) by mouth daily.  30 tablet  11  . glimepiride (AMARYL) 2 MG tablet TAKE 1 TABLET BY MOUTH TWICE DAILY  60 tablet  0  . losartan (COZAAR) 50 MG tablet TAKE 1 TABLET BY MOUTH DAILY  30 tablet  0  . nitroGLYCERIN (NITRODUR - DOSED IN MG/24 HR) 0.2 mg/hr Place 1 patch (0.2 mg total) onto the skin daily.  30 patch  4  . omeprazole (PRILOSEC) 20 MG capsule Take 1 capsule (20 mg total) by mouth daily. PATIENT NEEDS OFFICE VISIT FOR ADDITIONAL REFILLS  30 capsule  0  . pravastatin (PRAVACHOL) 40 MG tablet Take 1 tablet (40 mg total) by mouth every evening.  30 tablet  11   No current facility-administered medications for this visit.   Social History History   Social History  . Marital Status: Widowed    Spouse Name: N/A    Number of Children: 1  . Years of Education: N/A   Occupational History  . electronics technician  [employer]   Social History Main Topics  . Smoking status: Former Smoker    Quit date: 08/01/1992  . Smokeless tobacco: Never Used  . Alcohol Use: 0.0 oz/week    .25 drink(s) per week     Comment: 1 beer every 3-4 months   . Drug Use: No  . Sexually Active: Not Currently   Other Topics Concern  . Not on file   Social History Narrative   Pt lives in [place] alone.  Widowed.  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    Review of Systems General: No chills, fever, night sweats or weight changes Cardiovascular: No chest pain, dyspnea on exertion, edema, orthopnea, palpitations, paroxysmal nocturnal dyspnea Dermatological: No rash, lesions or masses Respiratory: No cough, dyspnea Urologic: No hematuria, dysuria Abdominal: No nausea, vomiting, diarrhea, bright red blood per rectum, melena, or hematemesis Neurologic: No visual changes, weakness, changes in mental status All other systems reviewed and are otherwise negative except as noted above.  Physical Exam Vitals: Blood pressure 118/62, pulse 64, height 5\' 10"  (1.778 m), weight 204 lb 3.2 oz (92.625 kg), SpO2 95.00%.  General: Well developed, well appearing 79 y.o. male in no acute distress. HEENT: Normocephalic, atraumatic. EOMs intact. Sclera nonicteric. Oropharynx clear.  Neck: Supple without bruits. No JVD. Lungs: Respirations regular and unlabored, CTA bilaterally. No wheezes, rales or rhonchi. Heart: RRR. S1, S2 present. No murmurs, rub, S3 or S4. Abdomen: Soft, non-tender, non-distended. BS present x 4 quadrants. No hepatosplenomegaly.  Extremities: No clubbing, cyanosis or edema. DP/PT/Radials 2+ and equal bilaterally. Psych: Normal affect. Neuro: Alert and oriented X 3. Moves all extremities spontaneously.   Diagnostics Device interrogation today - performed by industry - Normal device function. Thresholds and sensing consistent with previous device measurements. Impedance trends  stable over time. No evidence of any ventricular arrhythmias. No mode switches. Histogram distribution appropriate for patient and level of activity. No changes made this session. Device programmed at appropriate safety margins. Device programmed to optimize intrinsic conduction. Estimated longevity 6 - 6.8 years.   Assessment and Plan 1. Ischemic CM s/p ICD implant Normal device function No programming changes made Continue routine ICD follow-up every 3 months Return for follow-up with Dr. Johney Frame in 6 months 2. Chronic systolic HF Stable without worsening HF symptoms; euvolemic by exam Continue medical therapy 3. CAD Stable without anginal symptoms Continue medical therapy  Signed, Rick Duff, PA-C 02/19/2013, 10:08 AM

## 2013-02-19 ENCOUNTER — Ambulatory Visit (INDEPENDENT_AMBULATORY_CARE_PROVIDER_SITE_OTHER): Payer: Federal, State, Local not specified - PPO | Admitting: Cardiology

## 2013-02-19 ENCOUNTER — Encounter: Payer: Self-pay | Admitting: Cardiology

## 2013-02-19 VITALS — BP 118/62 | HR 64 | Ht 70.0 in | Wt 204.2 lb

## 2013-02-19 DIAGNOSIS — I5022 Chronic systolic (congestive) heart failure: Secondary | ICD-10-CM

## 2013-02-19 DIAGNOSIS — I2589 Other forms of chronic ischemic heart disease: Secondary | ICD-10-CM

## 2013-02-19 DIAGNOSIS — Z95810 Presence of automatic (implantable) cardiac defibrillator: Secondary | ICD-10-CM

## 2013-02-19 DIAGNOSIS — I2510 Atherosclerotic heart disease of native coronary artery without angina pectoris: Secondary | ICD-10-CM

## 2013-02-19 DIAGNOSIS — I255 Ischemic cardiomyopathy: Secondary | ICD-10-CM

## 2013-02-19 LAB — ICD DEVICE OBSERVATION
AL AMPLITUDE: 1.3 mv
AL IMPEDENCE ICD: 440 Ohm
AL THRESHOLD: 1 v
ATRIAL PACING ICD: 53 pct
BAMS-0001: 170 {beats}/min
BAMS-0003: 60 {beats}/min
DEV-0020ICD: NEGATIVE
DEVICE MODEL ICD: 828604
FVT: 0
HV IMPEDENCE: 80 Ohm
PACEART VT: 0
RV LEAD AMPLITUDE: 12 mv
RV LEAD IMPEDENCE ICD: 480 Ohm
RV LEAD THRESHOLD: 0.5 v
VENTRICULAR PACING ICD: 0 pct
VF: 0

## 2013-02-19 NOTE — Patient Instructions (Addendum)
No changes were made today.  Follow up in 6 months with Dr.Allred you will receive a letter 2 months prior to call and schedule your appointment  Follow up with Dr.Jordan November 2014. You will receive a reminder letter 1 moth prior to call and schedule your appointment

## 2013-03-19 ENCOUNTER — Encounter: Payer: Federal, State, Local not specified - PPO | Admitting: Cardiology

## 2013-03-22 ENCOUNTER — Other Ambulatory Visit: Payer: Self-pay | Admitting: Physician Assistant

## 2013-04-08 ENCOUNTER — Encounter: Payer: Self-pay | Admitting: Internal Medicine

## 2013-04-22 ENCOUNTER — Other Ambulatory Visit: Payer: Self-pay | Admitting: Physician Assistant

## 2013-04-22 ENCOUNTER — Other Ambulatory Visit: Payer: Self-pay | Admitting: *Deleted

## 2013-04-22 MED ORDER — NITROGLYCERIN 0.2 MG/HR TD PT24: 1.0000 | MEDICATED_PATCH | Freq: Every day | TRANSDERMAL | Status: DC

## 2013-05-17 ENCOUNTER — Other Ambulatory Visit: Payer: Self-pay | Admitting: Physician Assistant

## 2013-05-27 ENCOUNTER — Ambulatory Visit (INDEPENDENT_AMBULATORY_CARE_PROVIDER_SITE_OTHER): Payer: Federal, State, Local not specified - PPO | Admitting: *Deleted

## 2013-05-27 ENCOUNTER — Encounter: Payer: Self-pay | Admitting: Internal Medicine

## 2013-05-27 DIAGNOSIS — I495 Sick sinus syndrome: Secondary | ICD-10-CM

## 2013-05-28 LAB — REMOTE ICD DEVICE
AL IMPEDENCE ICD: 440 Ohm
BAMS-0001: 170 {beats}/min
HV IMPEDENCE: 86 Ohm
RV LEAD IMPEDENCE ICD: 450 Ohm
VENTRICULAR PACING ICD: 1 pct

## 2013-05-29 ENCOUNTER — Encounter: Payer: Self-pay | Admitting: *Deleted

## 2013-05-31 ENCOUNTER — Other Ambulatory Visit: Payer: Self-pay | Admitting: Cardiology

## 2013-06-06 NOTE — Progress Notes (Signed)
Remote defib received  

## 2013-06-18 ENCOUNTER — Other Ambulatory Visit: Payer: Self-pay

## 2013-06-18 MED ORDER — FUROSEMIDE 20 MG PO TABS: 20.0000 mg | ORAL_TABLET | Freq: Every day | ORAL | Status: DC

## 2013-06-20 ENCOUNTER — Encounter: Payer: Self-pay | Admitting: Cardiology

## 2013-06-20 ENCOUNTER — Ambulatory Visit (INDEPENDENT_AMBULATORY_CARE_PROVIDER_SITE_OTHER): Payer: Federal, State, Local not specified - PPO | Admitting: Cardiology

## 2013-06-20 VITALS — BP 118/58 | HR 69 | Ht 70.0 in | Wt 193.0 lb

## 2013-06-20 DIAGNOSIS — I2510 Atherosclerotic heart disease of native coronary artery without angina pectoris: Secondary | ICD-10-CM

## 2013-06-20 DIAGNOSIS — I509 Heart failure, unspecified: Secondary | ICD-10-CM

## 2013-06-20 DIAGNOSIS — E785 Hyperlipidemia, unspecified: Secondary | ICD-10-CM

## 2013-06-20 DIAGNOSIS — I5022 Chronic systolic (congestive) heart failure: Secondary | ICD-10-CM

## 2013-06-20 LAB — HEPATIC FUNCTION PANEL
AST: 27 U/L (ref 0–37)
Albumin: 3.6 g/dL (ref 3.5–5.2)
Alkaline Phosphatase: 35 U/L — ABNORMAL LOW (ref 39–117)
Bilirubin, Direct: 0.2 mg/dL (ref 0.0–0.3)
Total Bilirubin: 0.8 mg/dL (ref 0.3–1.2)
Total Protein: 6.7 g/dL (ref 6.0–8.3)

## 2013-06-20 LAB — BASIC METABOLIC PANEL
BUN: 33 mg/dL — ABNORMAL HIGH (ref 6–23)
CO2: 31 mEq/L (ref 19–32)
Calcium: 9.9 mg/dL (ref 8.4–10.5)
Chloride: 100 mEq/L (ref 96–112)
Creatinine, Ser: 1.6 mg/dL — ABNORMAL HIGH (ref 0.4–1.5)
Glucose, Bld: 198 mg/dL — ABNORMAL HIGH (ref 70–99)
Potassium: 4.7 mEq/L (ref 3.5–5.1)

## 2013-06-20 LAB — LIPID PANEL
Cholesterol: 136 mg/dL (ref 0–200)
LDL Cholesterol: 76 mg/dL (ref 0–99)
Total CHOL/HDL Ratio: 4

## 2013-06-20 NOTE — Patient Instructions (Signed)
We will check lab work today  Continue your current therapy  Increase your walking

## 2013-06-20 NOTE — Progress Notes (Signed)
Antonio Ray Date of Birth: 05-10-1934 Medical Record #[id number]  History of Present Illness: Antonio Ray is seen back today for a followup visit. He has a history of coronary disease with remote myocardial infarction in 1994. He had emergent angioplasty of the LAD. Cardiac catheterization 2007 showed nonobstructive disease. He has an ischemic cardiomyopathy with ejection fraction of 30-35%. His last Myoview in 2011 showed an anterior wall scar without ischemia. He states he is doing well from a cardiac standpoint. He's had no recent chest pain, shortness of breath, or palpiations. He's had no increase in edema. His weight is down 12 pounds.He has experienced some heartburn that is easily relieved with drinking ginger ale.   Current Outpatient Prescriptions on File Prior to Visit  Medication Sig Dispense Refill  . aspirin 81 MG EC tablet Take 81 mg by mouth daily.        . Cholecalciferol (VITAMIN D PO) Take 2,000 Units by mouth daily. Ecotrin      . fenofibrate (TRICOR) 145 MG tablet TAKE 1 TABLET BY MOUTH DAILY  30 tablet  0  . Fluticasone-Salmeterol (ADVAIR DISKUS) 250-50 MCG/DOSE AEPB Inhale 1 puff into the lungs 2 (two) times daily.  60 each  3  . FREESTYLE TEST STRIPS test strip USE TO TEST BLOOD SUGER TWICE DAILY  100 each  1  . furosemide (LASIX) 20 MG tablet Take 1 tablet (20 mg total) by mouth daily.  90 tablet  3  . losartan (COZAAR) 50 MG tablet Take 1 tablet (50 mg total) by mouth daily. PATIENT NEEDS OFFICE VISIT FOR ADDITIONAL REFILLS  30 tablet  0  . nitroGLYCERIN (NITRODUR - DOSED IN MG/24 HR) 0.2 mg/hr patch Place 1 patch (0.2 mg total) onto the skin daily.  30 patch  11  . omeprazole (PRILOSEC) 20 MG capsule TAKE ONE CAPSULE BY MOUTH EVERY DAY. NEED OFFICE VISIT FOR MORE REFILLS  30 capsule  0  . pravastatin (PRAVACHOL) 40 MG tablet Take 1 tablet (40 mg total) by mouth every evening.  30 tablet  11   No current facility-administered medications on file prior to visit.     Allergies  Allergen Reactions  . Carvedilol Other (See Comments)    Made dizzy  . Codeine Other (See Comments)    Gets very angry, disoriented  . Flomax [Tamsulosin Hcl]     Shortness of breath  . Levofloxacin [Levofloxacin] Other (See Comments)    unknown  . Morphine And Related Other (See Comments)    Gets very angry, disoriented  . Sulfa Antibiotics     Shortness of breath    Past Medical History  Diagnosis Date  . GERD (gastroesophageal reflux disease)   . Coronary artery disease     History of remote anterior MI with PCI to LAD in 2006; most recent cath 2007, no intervention required  . MI, old 2007    ANTERIOR  . Chronic systolic dysfunction of left ventricle     EF 30-35%, CLASS II - III SYMPTOMS; intolerant to Coreg  . Hyperlipidemia   . PVC's (premature ventricular contractions)   . Diabetes mellitus   . DOE (dyspnea on exertion)   . ICD (implantable cardiac defibrillator) in place August 2012  . Intolerance, drug     Intolerant to beta blockers  . Myocardial infarction   . Pneumonia   . CRF (chronic renal failure)   . BPH (benign prostatic hyperplasia)   . CKD (chronic kidney disease) stage 3, GFR 30-59 ml/min 06/08/2012  Past Surgical History  Procedure Laterality Date  . Cardiac catheterization  01/11/2006    DEMONSTRATES AKINESIA OF THE DISTAL ANTERIOR WALL, DISTAL INFERIOR WALL AND AKINESIA OF THE APEX. THE BASAL SEGMENTS CONTRACT WELL AND OVERALL EF 35%  . Coronary angioplasty  1994    TO THE LAD  . Foot surgery      remote  . Cholecystectomy  2/11  . Icd implant  03/30/11    Analyze ST study patient    History  Smoking status  . Former Smoker  . Quit date: 08/01/1992  Smokeless tobacco  . Never Used    History  Alcohol Use  . 0.0 oz/week  . Marland Kitchen25 drink(s) per week    Comment: 1 beer every 3-4 months     Family History  Problem Relation Age of Onset  . Stroke Father     Review of Systems: As noted in history of present  illness. All other systems were reviewed and are negative.  Physical Exam: BP 118/58  Pulse 69  Ht 5\' 10"  (1.778 m)  Wt 193 lb (87.544 kg)  BMI 27.69 kg/m2 Patient is very pleasant and in no acute distress. Skin is warm and dry. Color is normal.  HEENT is unremarkable. Normocephalic/atraumatic. PERRL. Sclera are nonicteric. Neck is supple. No masses. No JVD. Lungs are clear. Cardiac exam shows a regular rate and rhythm. Abdomen is soft and obese. No masses or bruits . Extremities are without edema.  pedal pulses are good. Gait and ROM are intact. No gross neurologic deficits noted.   LABORATORY DATA:   Assessment / Plan: 1. Coronary disease with remote anterior myocardial infarction in 1994. Cardiac catheterization 2007 showed nonobstructive disease. His last stress test in October 2011 showed no ischemia. We will continue with his current medical therapy including aspirin and isosorbide. He has a history of intolerance to carvedilol because of dizziness. I will followup again in 6 months.  2. Ischemic cardiomyopathy with chronic systolic CHF. He is asymptomatic. He appears to be euvolemic. We'll continue with his current diuretic dose and ARB.  3. Prophylactic ICD.  4. Hyperlipidemia. We'll check a fasting lipid panel and chemistries today.  5. Chronic kidney disease stage III.

## 2013-06-20 NOTE — Addendum Note (Signed)
Addended by: Tonita Phoenix on: 06/20/2013 09:24 AM   Modules accepted: Orders

## 2013-07-03 ENCOUNTER — Other Ambulatory Visit: Payer: Self-pay | Admitting: Cardiology

## 2013-07-08 ENCOUNTER — Other Ambulatory Visit: Payer: Self-pay

## 2013-07-08 DIAGNOSIS — E7800 Pure hypercholesterolemia, unspecified: Secondary | ICD-10-CM

## 2013-07-08 MED ORDER — PRAVASTATIN SODIUM 40 MG PO TABS: 40.0000 mg | ORAL_TABLET | Freq: Every evening | ORAL | Status: DC

## 2013-07-22 ENCOUNTER — Other Ambulatory Visit: Payer: Self-pay | Admitting: Physician Assistant

## 2013-08-21 ENCOUNTER — Encounter: Payer: Self-pay | Admitting: Internal Medicine

## 2013-08-21 ENCOUNTER — Encounter (INDEPENDENT_AMBULATORY_CARE_PROVIDER_SITE_OTHER): Payer: Self-pay

## 2013-08-21 ENCOUNTER — Ambulatory Visit (INDEPENDENT_AMBULATORY_CARE_PROVIDER_SITE_OTHER): Payer: Federal, State, Local not specified - PPO | Admitting: Internal Medicine

## 2013-08-21 VITALS — BP 98/70 | HR 65 | Ht 70.0 in | Wt 185.0 lb

## 2013-08-21 DIAGNOSIS — I2510 Atherosclerotic heart disease of native coronary artery without angina pectoris: Secondary | ICD-10-CM

## 2013-08-21 DIAGNOSIS — I5189 Other ill-defined heart diseases: Secondary | ICD-10-CM | POA: Diagnosis not present

## 2013-08-21 DIAGNOSIS — I495 Sick sinus syndrome: Secondary | ICD-10-CM

## 2013-08-21 DIAGNOSIS — I5022 Chronic systolic (congestive) heart failure: Secondary | ICD-10-CM

## 2013-08-21 DIAGNOSIS — I509 Heart failure, unspecified: Secondary | ICD-10-CM

## 2013-08-21 DIAGNOSIS — Z95810 Presence of automatic (implantable) cardiac defibrillator: Secondary | ICD-10-CM | POA: Diagnosis not present

## 2013-08-21 LAB — MDC_IDC_ENUM_SESS_TYPE_INCLINIC
Battery Remaining Longevity: 76.8 mo
Brady Statistic RV Percent Paced: 0.33 %
HighPow Impedance: 86.625
Implantable Pulse Generator Serial Number: 828604
Lead Channel Impedance Value: 437.5 Ohm
Lead Channel Pacing Threshold Amplitude: 0.5 V
Lead Channel Pacing Threshold Amplitude: 0.5 V
Lead Channel Pacing Threshold Amplitude: 1 V
Lead Channel Pacing Threshold Amplitude: 1 V
Lead Channel Pacing Threshold Pulse Width: 0.5 ms
Lead Channel Sensing Intrinsic Amplitude: 1.9 mV
Lead Channel Sensing Intrinsic Amplitude: 12 mV
Lead Channel Setting Pacing Amplitude: 2 V
Lead Channel Setting Pacing Pulse Width: 0.5 ms
Lead Channel Setting Sensing Sensitivity: 0.5 mV
MDC IDC MSMT LEADCHNL RA PACING THRESHOLD PULSEWIDTH: 0.5 ms
MDC IDC MSMT LEADCHNL RA PACING THRESHOLD PULSEWIDTH: 0.5 ms
MDC IDC MSMT LEADCHNL RV IMPEDANCE VALUE: 487.5 Ohm
MDC IDC MSMT LEADCHNL RV PACING THRESHOLD PULSEWIDTH: 0.5 ms
MDC IDC SESS DTM: 20150121122458
MDC IDC SET LEADCHNL RV PACING AMPLITUDE: 2.5 V
MDC IDC STAT BRADY RA PERCENT PACED: 50 %
Zone Setting Detection Interval: 280 ms

## 2013-08-21 NOTE — Progress Notes (Signed)
PCP: Hayden Rasmussen., MD Primary Cardiologist:  Dr [place]  [name] is a 80 y.o. male who presents today for routine electrophysiology followup.  Since last being seen in our clinic, the patient reports doing very well.  Today, he denies symptoms of palpitations, chest pain, shortness of breath,  lower extremity edema, presyncope, syncope, or ICD shocks.  The patient is otherwise without complaint today.   Past Medical History  Diagnosis Date  . GERD (gastroesophageal reflux disease)   . Coronary artery disease     History of remote anterior MI with PCI to LAD in 2006; most recent cath 2007, no intervention required  . MI, old 2007    ANTERIOR  . Chronic systolic dysfunction of left ventricle     EF 30-35%, CLASS II - III SYMPTOMS; intolerant to Coreg  . Hyperlipidemia   . PVC's (premature ventricular contractions)   . Diabetes mellitus   . DOE (dyspnea on exertion)   . ICD (implantable cardiac defibrillator) in place August 2012  . Intolerance, drug     Intolerant to beta blockers  . Myocardial infarction   . Pneumonia   . CRF (chronic renal failure)   . BPH (benign prostatic hyperplasia)   . CKD (chronic kidney disease) stage 3, GFR 30-59 ml/min 06/08/2012   Past Surgical History  Procedure Laterality Date  . Cardiac catheterization  01/11/2006    DEMONSTRATES AKINESIA OF THE DISTAL ANTERIOR WALL, DISTAL INFERIOR WALL AND AKINESIA OF THE APEX. THE BASAL SEGMENTS CONTRACT WELL AND OVERALL EF 35%  . Coronary angioplasty  1994    TO THE LAD  . Foot surgery      remote  . Cholecystectomy  2/11  . Icd implant  03/30/11    Analyze ST study patient    Current Outpatient Prescriptions  Medication Sig Dispense Refill  . amoxicillin (AMOXIL) 875 MG tablet As dirceted      . aspirin 81 MG EC tablet Take 81 mg by mouth daily.        . Cholecalciferol (VITAMIN D PO) Take 2,000 Units by mouth daily. Ecotrin      . fenofibrate (TRICOR) 145 MG tablet TAKE 1 TABLET BY MOUTH DAILY   30 tablet  6  . Fluticasone-Salmeterol (ADVAIR DISKUS) 250-50 MCG/DOSE AEPB Inhale 1 puff into the lungs 2 (two) times daily.  60 each  3  . furosemide (LASIX) 20 MG tablet Take 1 tablet (20 mg total) by mouth daily.  90 tablet  3  . losartan (COZAAR) 50 MG tablet Take 1 tablet (50 mg total) by mouth daily. PATIENT NEEDS OFFICE VISIT FOR ADDITIONAL REFILLS  30 tablet  0  . nitroGLYCERIN (NITRODUR - DOSED IN MG/24 HR) 0.2 mg/hr patch Place 1 patch (0.2 mg total) onto the skin daily.  30 patch  11  . omeprazole (PRILOSEC) 20 MG capsule TAKE ONE CAPSULE BY MOUTH EVERY DAY      . pravastatin (PRAVACHOL) 40 MG tablet Take 1 tablet (40 mg total) by mouth every evening.  90 tablet  1  . PROVENTIL HFA 108 (90 BASE) MCG/ACT inhaler As directed       No current facility-administered medications for this visit.    Physical Exam: Filed Vitals:   08/21/13 1014  BP: 98/70  Pulse: 65  Height: 5\' 10"  (1.778 m)  Weight: 185 lb (83.915 kg)    GEN- The patient is well appearing, alert and oriented x 3 today.   Head- normocephalic, atraumatic Eyes-  Sclera clear, conjunctiva pink  Ears- hearing intact Oropharynx- clear Lungs- Clear to ausculation bilaterally, normal work of breathing Chest- ICD pocket is well healed Heart- Regular rate and rhythm, no murmurs, rubs or gallops, PMI not laterally displaced GI- soft, NT, ND, + BS Extremities- no clubbing, cyanosis, or edema  ICD interrogation- reviewed in detail today,  See PACEART report  ekg today reveals atrial pacing, anterior infarction with lateral twi  Assessment and Plan:   1. Chronic systolic dysfunction Stable No change required today Normal ICD function See Pace Art report No changes today  2. CAD No ischemic symptoms  I will see again in 12 months

## 2013-08-21 NOTE — Patient Instructions (Signed)
Your physician wants you to follow-up in: 6 months in the device clinic and 12 months with Dr Rayann Heman Dennis Bast will receive a reminder letter in the mail two months in advance. If you don't receive a letter, please call our office to schedule the follow-up appointment.   Remote monitoring is used to monitor your Pacemaker or ICD from home. This monitoring reduces the number of office visits required to check your device to one time per year. It allows [place] to keep an eye on the functioning of your device to ensure it is working properly. You are scheduled for a device check from home on 04/24/. You may send your transmission at any time that day. If you have a wireless device, the transmission will be sent automatically. After your physician reviews your transmission, you will receive a postcard with your next transmission date.

## 2013-09-10 ENCOUNTER — Emergency Department (HOSPITAL_COMMUNITY): Payer: Federal, State, Local not specified - PPO

## 2013-09-10 ENCOUNTER — Encounter (HOSPITAL_COMMUNITY): Payer: Self-pay | Admitting: Emergency Medicine

## 2013-09-10 ENCOUNTER — Inpatient Hospital Stay (HOSPITAL_COMMUNITY)
Admission: EM | Admit: 2013-09-10 | Discharge: 2013-09-15 | DRG: 871 | Disposition: A | Discharge status: Home Health | Payer: Federal, State, Local not specified - PPO | Attending: Internal Medicine | Admitting: Internal Medicine

## 2013-09-10 DIAGNOSIS — J449 Chronic obstructive pulmonary disease, unspecified: Secondary | ICD-10-CM

## 2013-09-10 DIAGNOSIS — I5022 Chronic systolic (congestive) heart failure: Secondary | ICD-10-CM

## 2013-09-10 DIAGNOSIS — N179 Acute kidney failure, unspecified: Secondary | ICD-10-CM

## 2013-09-10 DIAGNOSIS — Z9861 Coronary angioplasty status: Secondary | ICD-10-CM

## 2013-09-10 DIAGNOSIS — J9600 Acute respiratory failure, unspecified whether with hypoxia or hypercapnia: Secondary | ICD-10-CM | POA: Diagnosis present

## 2013-09-10 DIAGNOSIS — N183 Chronic kidney disease, stage 3 unspecified: Secondary | ICD-10-CM

## 2013-09-10 DIAGNOSIS — E785 Hyperlipidemia, unspecified: Secondary | ICD-10-CM

## 2013-09-10 DIAGNOSIS — G9340 Encephalopathy, unspecified: Secondary | ICD-10-CM

## 2013-09-10 DIAGNOSIS — E860 Dehydration: Secondary | ICD-10-CM | POA: Diagnosis present

## 2013-09-10 DIAGNOSIS — I2510 Atherosclerotic heart disease of native coronary artery without angina pectoris: Secondary | ICD-10-CM

## 2013-09-10 DIAGNOSIS — A419 Sepsis, unspecified organism: Principal | ICD-10-CM | POA: Diagnosis present

## 2013-09-10 DIAGNOSIS — N189 Chronic kidney disease, unspecified: Secondary | ICD-10-CM

## 2013-09-10 DIAGNOSIS — Z95810 Presence of automatic (implantable) cardiac defibrillator: Secondary | ICD-10-CM

## 2013-09-10 DIAGNOSIS — I5043 Acute on chronic combined systolic (congestive) and diastolic (congestive) heart failure: Secondary | ICD-10-CM | POA: Diagnosis present

## 2013-09-10 DIAGNOSIS — J189 Pneumonia, unspecified organism: Secondary | ICD-10-CM

## 2013-09-10 DIAGNOSIS — J441 Chronic obstructive pulmonary disease with (acute) exacerbation: Secondary | ICD-10-CM | POA: Diagnosis present

## 2013-09-10 DIAGNOSIS — R6520 Severe sepsis without septic shock: Secondary | ICD-10-CM

## 2013-09-10 DIAGNOSIS — G9341 Metabolic encephalopathy: Secondary | ICD-10-CM | POA: Diagnosis present

## 2013-09-10 DIAGNOSIS — K219 Gastro-esophageal reflux disease without esophagitis: Secondary | ICD-10-CM

## 2013-09-10 DIAGNOSIS — J9601 Acute respiratory failure with hypoxia: Secondary | ICD-10-CM

## 2013-09-10 DIAGNOSIS — R509 Fever, unspecified: Secondary | ICD-10-CM

## 2013-09-10 DIAGNOSIS — E119 Type 2 diabetes mellitus without complications: Secondary | ICD-10-CM

## 2013-09-10 DIAGNOSIS — J1100 Influenza due to unidentified influenza virus with unspecified type of pneumonia: Secondary | ICD-10-CM

## 2013-09-10 DIAGNOSIS — I509 Heart failure, unspecified: Secondary | ICD-10-CM | POA: Diagnosis present

## 2013-09-10 DIAGNOSIS — R0602 Shortness of breath: Secondary | ICD-10-CM

## 2013-09-10 DIAGNOSIS — D72829 Elevated white blood cell count, unspecified: Secondary | ICD-10-CM | POA: Diagnosis present

## 2013-09-10 LAB — COMPREHENSIVE METABOLIC PANEL
ALT: 51 U/L (ref 0–53)
AST: 42 U/L — AB (ref 0–37)
Albumin: 2.7 g/dL — ABNORMAL LOW (ref 3.5–5.2)
Alkaline Phosphatase: 79 U/L (ref 39–117)
BUN: 43 mg/dL — ABNORMAL HIGH (ref 6–23)
CALCIUM: 9.2 mg/dL (ref 8.4–10.5)
CO2: 26 meq/L (ref 19–32)
CREATININE: 2.15 mg/dL — AB (ref 0.50–1.35)
Chloride: 103 mEq/L (ref 96–112)
GFR calc Af Amer: 32 mL/min — ABNORMAL LOW (ref >90)
GFR calc non Af Amer: 28 mL/min — ABNORMAL LOW (ref >90)
Glucose, Bld: 272 mg/dL — ABNORMAL HIGH (ref 70–99)
Potassium: 3.8 mEq/L (ref 3.7–5.3)
Sodium: 144 mEq/L (ref 137–147)
TOTAL PROTEIN: 7.2 g/dL (ref 6.0–8.3)
Total Bilirubin: 0.4 mg/dL (ref 0.3–1.2)

## 2013-09-10 LAB — CBC WITH DIFFERENTIAL/PLATELET
BASOS ABS: 0 10*3/uL (ref 0.0–0.1)
BASOS PCT: 0 % (ref 0–1)
EOS ABS: 0 10*3/uL (ref 0.0–0.7)
EOS PCT: 0 % (ref 0–5)
HCT: 39.4 % (ref 39.0–52.0)
Hemoglobin: 13.2 g/dL (ref 13.0–17.0)
LYMPHS PCT: 7 % — AB (ref 12–46)
Lymphs Abs: 0.9 10*3/uL (ref 0.7–4.0)
MCH: 30 pg (ref 26.0–34.0)
MCHC: 33.5 g/dL (ref 30.0–36.0)
MCV: 89.5 fL (ref 78.0–100.0)
Monocytes Absolute: 1.1 10*3/uL — ABNORMAL HIGH (ref 0.1–1.0)
Monocytes Relative: 8 % (ref 3–12)
Neutro Abs: 11.3 10*3/uL — ABNORMAL HIGH (ref 1.7–7.7)
Neutrophils Relative %: 85 % — ABNORMAL HIGH (ref 43–77)
PLATELETS: 260 10*3/uL (ref 150–400)
RBC: 4.4 MIL/uL (ref 4.22–5.81)
RDW: 14 % (ref 11.5–15.5)
WBC: 13.3 10*3/uL — ABNORMAL HIGH (ref 4.0–10.5)

## 2013-09-10 LAB — URINE MICROSCOPIC-ADD ON

## 2013-09-10 LAB — URINALYSIS, ROUTINE W REFLEX MICROSCOPIC
GLUCOSE, UA: 500 mg/dL — AB
Ketones, ur: NEGATIVE mg/dL
Leukocytes, UA: NEGATIVE
Nitrite: NEGATIVE
Protein, ur: 30 mg/dL — AB
SPECIFIC GRAVITY, URINE: 1.022 (ref 1.005–1.030)
Urobilinogen, UA: 1 mg/dL (ref 0.0–1.0)
pH: 5.5 (ref 5.0–8.0)

## 2013-09-10 LAB — CG4 I-STAT (LACTIC ACID): Lactic Acid, Venous: 1.42 mmol/L (ref 0.5–2.2)

## 2013-09-10 LAB — INFLUENZA PANEL BY PCR (TYPE A & B)
H1N1FLUPCR: NOT DETECTED
INFLBPCR: NEGATIVE
Influenza A By PCR: POSITIVE — AB

## 2013-09-10 MED ORDER — SODIUM CHLORIDE 0.9 % IV SOLN
1000.0000 mL | Freq: Once | INTRAVENOUS | Status: AC
  Administered 2013-09-10: 1000 mL via INTRAVENOUS

## 2013-09-10 MED ORDER — DEXTROSE 5 % IV SOLN
1.0000 g | Freq: Once | INTRAVENOUS | Status: AC
  Administered 2013-09-10: 1 g via INTRAVENOUS
  Filled 2013-09-10: qty 10

## 2013-09-10 MED ORDER — SODIUM CHLORIDE 0.9 % IJ SOLN
3.0000 mL | Freq: Two times a day (BID) | INTRAMUSCULAR | Status: DC
  Administered 2013-09-11 – 2013-09-15 (×7): 3 mL via INTRAVENOUS

## 2013-09-10 MED ORDER — HEPARIN SODIUM (PORCINE) 5000 UNIT/ML IJ SOLN
5000.0000 [IU] | Freq: Three times a day (TID) | INTRAMUSCULAR | Status: DC
  Administered 2013-09-11 (×2): 5000 [IU] via SUBCUTANEOUS
  Filled 2013-09-10 (×4): qty 1

## 2013-09-10 MED ORDER — ONDANSETRON HCL 4 MG/2ML IJ SOLN: 4.0000 mg | Freq: Four times a day (QID) | INTRAMUSCULAR | Status: DC | PRN

## 2013-09-10 MED ORDER — DEXTROSE 5 % IV SOLN: 500.0000 mg | INTRAVENOUS | Status: DC

## 2013-09-10 MED ORDER — ACETAMINOPHEN 325 MG PO TABS: 650.0000 mg | ORAL_TABLET | Freq: Four times a day (QID) | ORAL | Status: DC | PRN

## 2013-09-10 MED ORDER — SODIUM CHLORIDE 0.9 % IV SOLN
1000.0000 mL | INTRAVENOUS | Status: DC
  Administered 2013-09-10: 1000 mL via INTRAVENOUS

## 2013-09-10 MED ORDER — SIMVASTATIN 20 MG PO TABS
20.0000 mg | ORAL_TABLET | Freq: Every day | ORAL | Status: DC
  Administered 2013-09-11 – 2013-09-14 (×4): 20 mg via ORAL
  Filled 2013-09-10 (×5): qty 1

## 2013-09-10 MED ORDER — INSULIN GLARGINE 100 UNIT/ML ~~LOC~~ SOLN
20.0000 [IU] | Freq: Every day | SUBCUTANEOUS | Status: DC
  Administered 2013-09-11 (×2): 20 [IU] via SUBCUTANEOUS
  Filled 2013-09-10 (×3): qty 0.2

## 2013-09-10 MED ORDER — ACETAMINOPHEN 650 MG RE SUPP: 650.0000 mg | Freq: Four times a day (QID) | RECTAL | Status: DC | PRN

## 2013-09-10 MED ORDER — ONDANSETRON HCL 4 MG/2ML IJ SOLN: 4.0000 mg | Freq: Three times a day (TID) | INTRAMUSCULAR | Status: DC | PRN

## 2013-09-10 MED ORDER — MOMETASONE FURO-FORMOTEROL FUM 100-5 MCG/ACT IN AERO
2.0000 | INHALATION_SPRAY | Freq: Two times a day (BID) | RESPIRATORY_TRACT | Status: DC
  Administered 2013-09-11 – 2013-09-13 (×2): 2 via RESPIRATORY_TRACT
  Filled 2013-09-10 (×2): qty 8.8

## 2013-09-10 MED ORDER — DEXTROSE 5 % IV SOLN
1.0000 g | INTRAVENOUS | Status: DC
  Administered 2013-09-11 – 2013-09-14 (×4): 1 g via INTRAVENOUS
  Filled 2013-09-10 (×5): qty 10

## 2013-09-10 MED ORDER — NITROGLYCERIN 0.2 MG/HR TD PT24
0.2000 mg | MEDICATED_PATCH | Freq: Every day | TRANSDERMAL | Status: DC
  Administered 2013-09-12 – 2013-09-15 (×4): 0.2 mg via TRANSDERMAL
  Filled 2013-09-10 (×6): qty 1

## 2013-09-10 MED ORDER — PANTOPRAZOLE SODIUM 40 MG PO TBEC
40.0000 mg | DELAYED_RELEASE_TABLET | Freq: Every day | ORAL | Status: DC
  Administered 2013-09-11 – 2013-09-15 (×5): 40 mg via ORAL
  Filled 2013-09-10 (×5): qty 1

## 2013-09-10 MED ORDER — DEXTROSE 5 % IV SOLN
500.0000 mg | Freq: Once | INTRAVENOUS | Status: AC
  Administered 2013-09-10: 500 mg via INTRAVENOUS

## 2013-09-10 MED ORDER — FENOFIBRATE 160 MG PO TABS
160.0000 mg | ORAL_TABLET | Freq: Every day | ORAL | Status: DC
  Administered 2013-09-11 – 2013-09-15 (×5): 160 mg via ORAL
  Filled 2013-09-10 (×5): qty 1

## 2013-09-10 MED ORDER — INSULIN ASPART 100 UNIT/ML ~~LOC~~ SOLN
0.0000 [IU] | Freq: Three times a day (TID) | SUBCUTANEOUS | Status: DC
  Administered 2013-09-11: 3 [IU] via SUBCUTANEOUS
  Administered 2013-09-11: 2 [IU] via SUBCUTANEOUS
  Administered 2013-09-11 – 2013-09-12 (×3): 5 [IU] via SUBCUTANEOUS
  Administered 2013-09-12: 3 [IU] via SUBCUTANEOUS
  Administered 2013-09-13: 2 [IU] via SUBCUTANEOUS
  Administered 2013-09-13: 5 [IU] via SUBCUTANEOUS
  Administered 2013-09-13: 3 [IU] via SUBCUTANEOUS
  Administered 2013-09-14: 1 [IU] via SUBCUTANEOUS
  Administered 2013-09-14: 2 [IU] via SUBCUTANEOUS
  Administered 2013-09-15: 1 [IU] via SUBCUTANEOUS
  Administered 2013-09-15: 2 [IU] via SUBCUTANEOUS

## 2013-09-10 MED ORDER — ASPIRIN EC 81 MG PO TBEC
81.0000 mg | DELAYED_RELEASE_TABLET | Freq: Every day | ORAL | Status: DC
  Administered 2013-09-11 – 2013-09-15 (×5): 81 mg via ORAL
  Filled 2013-09-10 (×5): qty 1

## 2013-09-10 MED ORDER — ALBUTEROL SULFATE (2.5 MG/3ML) 0.083% IN NEBU
2.5000 mg | INHALATION_SOLUTION | RESPIRATORY_TRACT | Status: DC | PRN
  Administered 2013-09-11: 2.5 mg via RESPIRATORY_TRACT
  Filled 2013-09-10: qty 3

## 2013-09-10 MED ORDER — DEXTROSE 5 % IV SOLN
500.0000 mg | INTRAVENOUS | Status: DC
  Filled 2013-09-10: qty 500

## 2013-09-10 MED ORDER — ONDANSETRON HCL 4 MG PO TABS: 4.0000 mg | ORAL_TABLET | Freq: Four times a day (QID) | ORAL | Status: DC | PRN

## 2013-09-10 MED ORDER — SODIUM CHLORIDE 0.9 % IJ SOLN
3.0000 mL | Freq: Two times a day (BID) | INTRAMUSCULAR | Status: DC
  Administered 2013-09-11 – 2013-09-15 (×9): 3 mL via INTRAVENOUS

## 2013-09-10 MED ORDER — OSELTAMIVIR PHOSPHATE 30 MG PO CAPS
30.0000 mg | ORAL_CAPSULE | Freq: Two times a day (BID) | ORAL | Status: AC
  Administered 2013-09-11 – 2013-09-15 (×10): 30 mg via ORAL
  Filled 2013-09-10 (×12): qty 1

## 2013-09-10 MED ORDER — SODIUM CHLORIDE 0.9 % IV SOLN: INTRAVENOUS | Status: DC

## 2013-09-10 MED ORDER — ALBUTEROL SULFATE (2.5 MG/3ML) 0.083% IN NEBU: 2.5000 mg | INHALATION_SOLUTION | RESPIRATORY_TRACT | Status: DC | PRN

## 2013-09-10 NOTE — H&P (Signed)
[facility] History and Physical  Antonio Ray KNL:[id number] DOB: [birth year] DOA: 09/10/2013  Referring physician: ER physician. PCP: Hayden Rasmussen., MD  Specialists: Dr. Martinique. Cardiologist.  Chief Complaint: Confusion.  History obtained from patient's son.  HPI: Antonio Ray is a 80 y.o. male history of CAD status post stenting, chronic systolic heart failure status post AICD placement, chronic kidney disease baseline creatinine around 1.5, Diabetes mellitus type 2 was brought to the ER after patient was found to be confused. As per patient's son patient was not doing well on Friday last 5 days ago and was taken to the ER. Over that patient was found to be febrile but since patient's chest x-ray did not show anything acute and influenza PCR was negative was discharged home. Patient had followed up with his PCP yesterday. Since yesterday patient has been having some nonproductive cough which became more productive from today. Later in the evening patient became confused and EMS was called and patient was found to be febrile. Patient was brought to the ER and patient was found to be febrile and chest x-ray shows possible airspace infiltrates versus atelectasis. Given patient's symptoms at this time patient has been empirically started on antibiotics for pneumonia. Influenza PCR has been reordered. CT head did not show anything acute. Labs done in the ER shows worsening of the creatinine and leukocytosis. Patient was given 1 L normal saline bolus. As per patient's son patient's mental status has improved since he has ER. Patient is nonfocal otherwise. Patient's son states he also was sick since last week with flulike symptoms.  Review of Systems: As presented in the history of presenting illness, rest negative.  Past Medical History  Diagnosis Date  . GERD (gastroesophageal reflux disease)   . Coronary artery disease     History of remote anterior MI with PCI to LAD in 2006; most  recent cath 2007, no intervention required  . MI, old 2007    ANTERIOR  . Chronic systolic dysfunction of left ventricle     EF 30-35%, CLASS II - III SYMPTOMS; intolerant to Coreg  . Hyperlipidemia   . PVC's (premature ventricular contractions)   . Diabetes mellitus   . DOE (dyspnea on exertion)   . ICD (implantable cardiac defibrillator) in place August 2012  . Intolerance, drug     Intolerant to beta blockers  . Myocardial infarction   . Pneumonia   . CRF (chronic renal failure)   . BPH (benign prostatic hyperplasia)   . CKD (chronic kidney disease) stage 3, GFR 30-59 ml/min 06/08/2012   Past Surgical History  Procedure Laterality Date  . Cardiac catheterization  01/11/2006    DEMONSTRATES AKINESIA OF THE DISTAL ANTERIOR WALL, DISTAL INFERIOR WALL AND AKINESIA OF THE APEX. THE BASAL SEGMENTS CONTRACT WELL AND OVERALL EF 35%  . Coronary angioplasty  1994    TO THE LAD  . Foot surgery      remote  . Cholecystectomy  2/11  . Icd implant  03/30/11    Analyze ST study patient   Social History:  reports that he quit smoking about 21 years ago. He has never used smokeless tobacco. He reports that he drinks alcohol. He reports that he does not use illicit drugs. Where does patient live home. Can patient participate in ADLs? Yes.  Allergies  Allergen Reactions  . Carvedilol Other (See Comments)    Made dizzy  . Codeine Other (See Comments)    Gets very angry, disoriented  . Dilaudid [Hydromorphone  Hcl]     Out of it   . Flomax [Tamsulosin Hcl]     Shortness of breath  . Levofloxacin [Levofloxacin] Other (See Comments)    unknown  . Morphine And Related Other (See Comments)    Gets very angry, disoriented  . Sulfa Antibiotics     Shortness of breath    Family History:  Family History  Problem Relation Age of Onset  . Stroke Father       Prior to Admission medications   Medication Sig Start Date End Date Taking? Authorizing Provider  aspirin 81 MG EC tablet Take 81 mg  by mouth daily.     Yes Historical Provider, MD  benzonatate (TESSALON) 100 MG capsule Take 100 mg by mouth 3 (three) times daily as needed for cough.   Yes Historical Provider, MD  Cholecalciferol (VITAMIN D PO) Take 2,000 Units by mouth daily. Ecotrin   Yes Historical Provider, MD  fenofibrate (TRICOR) 145 MG tablet TAKE 1 TABLET BY MOUTH DAILY 07/03/13  Yes Peter M Martinique, MD  Fluticasone-Salmeterol (ADVAIR DISKUS) 250-50 MCG/DOSE AEPB Inhale 1 puff into the lungs 2 (two) times daily. 07/19/11  Yes Waldemar Dickens, MD  furosemide (LASIX) 20 MG tablet Take 1 tablet (20 mg total) by mouth daily. 06/18/13  Yes Peter M Martinique, MD  insulin glargine (LANTUS) 100 UNIT/ML injection Inject 20 Units into the skin at bedtime.   Yes Historical Provider, MD  losartan (COZAAR) 50 MG tablet Take 1 tablet (50 mg total) by mouth daily. PATIENT NEEDS OFFICE VISIT FOR ADDITIONAL REFILLS 05/17/13  Yes Heather M Marte, PA-C  nitroGLYCERIN (NITRODUR - DOSED IN MG/24 HR) 0.2 mg/hr patch Place 1 patch (0.2 mg total) onto the skin daily. 04/22/13  Yes Peter M Martinique, MD  omeprazole (PRILOSEC) 20 MG capsule TAKE ONE CAPSULE BY MOUTH EVERY DAY 03/22/13  Yes Heather M Marte, PA-C  pravastatin (PRAVACHOL) 40 MG tablet Take 1 tablet (40 mg total) by mouth every evening. 07/08/13 07/08/14 Yes Peter M Martinique, MD  PROVENTIL HFA 108 [phone] BASE) MCG/ACT inhaler As directed 05/24/13  Yes Historical Provider, MD    Physical Exam: Filed Vitals:   09/10/13 1830 09/10/13 1845 09/10/13 2030 09/10/13 2200  BP: 114/56 92/57 108/36 109/42  Pulse: 97 96 91 87  Temp:      TempSrc:      Resp: 38 29 26 21   SpO2: 93% 93% 98% 97%     General:  Well-developed and nourished.  Eyes: Anicteric no pallor.  ENT: No discharge from ears eyes nose mouth.  Neck: No mass felt.  Cardiovascular: S1-S2 heard.  Respiratory: No rhonchi or crepitations.  Abdomen: Soft nontender bowel sounds present. No guarding no rigidity.  Skin: No  rash.  Musculoskeletal: No edema.  Psychiatric: Presently patient is alert awake oriented to time place and person.  Neurologic: Moves all extremities.  Labs on Admission:  Basic Metabolic Panel:  Recent Labs Lab 09/10/13 1833  NA 144  K 3.8  CL 103  CO2 26  GLUCOSE 272*  BUN 43*  CREATININE 2.15*  CALCIUM 9.2   Liver Function Tests:  Recent Labs Lab 09/10/13 1833  AST 42*  ALT 51  ALKPHOS 79  BILITOT 0.4  PROT 7.2  ALBUMIN 2.7*   No results found for this basename: LIPASE, AMYLASE,  in the last 168 hours No results found for this basename: AMMONIA,  in the last 168 hours CBC:  Recent Labs Lab 09/10/13 1833  WBC 13.3*  NEUTROABS  11.3*  HGB 13.2  HCT 39.4  MCV 89.5  PLT 260   Cardiac Enzymes: No results found for this basename: CKTOTAL, CKMB, CKMBINDEX, TROPONINI,  in the last 168 hours  BNP (last 3 results) No results found for this basename: PROBNP,  in the last 8760 hours CBG: No results found for this basename: GLUCAP,  in the last 168 hours  Radiological Exams on Admission: Dg Chest Port 1 View  09/10/2013   CLINICAL DATA:  Fever.  Altered mental status.  EXAM: PORTABLE CHEST - 1 VIEW  COMPARISON:  PA and lateral chest 05/19/2012, 07/18/2011 and 03/30/2011.  FINDINGS: Mild, patchy airspace disease is seen in the lung bases. The lungs are otherwise clear. Heart size is normal. No pneumothorax or pleural effusion. AICD is noted.  IMPRESSION: Mild, patchy basilar airspace disease has an appearance most compatible with atelectasis rather than pneumonia.   Electronically Signed   By: Inge Rise M.D.   On: 09/10/2013 19:02     Assessment/Plan Principal Problem:   Acute encephalopathy Active Problems:   Coronary artery disease   Chronic systolic congestive heart failure   Diabetes mellitus type II, non insulin dependent   Pneumonia, community acquired   Renal failure (ARF), acute on chronic   1. Acute encephalopathy - most likely secondary  to infective causes and metabolic reasons. Most likely infectious source is pneumonia. And metabolic reason could be renal failure. 2. Possible pneumonia - patient has been placed on ceftriaxone and Zithromax. I have started patient empirically on Tamiflu renal dose. Check influenza PCR and blood cultures. Check urine strep and Legionella antigen. 3. Fever - seen #2. 4. Acute on chronic renal failure - at this time I'm holding off patient's Lasix. Closely follow intake output and metabolic panel. Patient did receive 1 L normal saline bolus in the ER holding off further fluids Given patient's history of systolic heart failure. If patient's creatinine further worsens may have to hold ARB. 5. Chronic systolic heart failure with last EF measured was 30-35% status post AICD placement - see #4. 6. History of CAD status post stenting - denies any chest pain. 7. Diabetes mellitus type 2 - continue home medications.  I have reviewed patient's old charts labs.  Code Status: Full code.  Family Communication: Patient's son.  Disposition Plan: Admit to inpatient.    Ilona Colley N. [facility] Pager [phone].  If 7PM-7AM, please contact night-coverage [email or web address] Password [facility] 09/10/2013, 10:24 PM

## 2013-09-10 NOTE — ED Provider Notes (Signed)
CSN: [id number]     Arrival date & time 09/10/13  1808 History   First MD Initiated Contact with Patient 09/10/13 1813     Chief Complaint  Patient presents with  . Fever  . Altered Mental Status     (Consider location/radiation/quality/duration/timing/severity/associated sxs/prior Treatment) The history is provided by the patient, a relative and medical records. No language interpreter was used.    Antonio Ray is a 80 y.o. male  with a hx of GERD, coronary artery disease, MI (2007), PVCs, insulin-dependent diabetes, ICD, chronic renal failure presents to the Emergency Department complaining of gradual, persistent, progressively worsening cough onset yesterday afternoon.  Associated symptoms include fever to 103.5, confusion as well as afternoon, tachypnea.  Patient's son at bedside reports that he was ill with viral syndrome over the weekend and seen at [facility]. He followed up with his primary care physician on Monday and seemed to be improving.  Son reports he had a cough throughout the night that "rattled."  The patient reported to son this morning but he did not feel well and by early afternoon son noted confusion.  Patient was given Tylenol around noon without resolution of fever. Nothing seems to make the symptoms better or worse. Patient denies headache, neck pain, chest pain, shortness of breath, abdominal pain, nausea, vomiting, diarrhea, dizziness, lightheadedness, dysuria, hematuria, syncope.  Past Medical History  Diagnosis Date  . GERD (gastroesophageal reflux disease)   . Coronary artery disease     History of remote anterior MI with PCI to LAD in 2006; most recent cath 2007, no intervention required  . MI, old 2007    ANTERIOR  . Chronic systolic dysfunction of left ventricle     EF 30-35%, CLASS II - III SYMPTOMS; intolerant to Coreg  . Hyperlipidemia   . PVC's (premature ventricular contractions)   . Diabetes mellitus   . DOE (dyspnea on exertion)   .  ICD (implantable cardiac defibrillator) in place August 2012  . Intolerance, drug     Intolerant to beta blockers  . Myocardial infarction   . Pneumonia   . CRF (chronic renal failure)   . BPH (benign prostatic hyperplasia)   . CKD (chronic kidney disease) stage 3, GFR 30-59 ml/min 06/08/2012   Past Surgical History  Procedure Laterality Date  . Cardiac catheterization  01/11/2006    DEMONSTRATES AKINESIA OF THE DISTAL ANTERIOR WALL, DISTAL INFERIOR WALL AND AKINESIA OF THE APEX. THE BASAL SEGMENTS CONTRACT WELL AND OVERALL EF 35%  . Coronary angioplasty  1994    TO THE LAD  . Foot surgery      remote  . Cholecystectomy  2/11  . Icd implant  03/30/11    Analyze ST study patient   Family History  Problem Relation Age of Onset  . Stroke Father    History  Substance Use Topics  . Smoking status: Former Smoker    Quit date: 08/01/1992  . Smokeless tobacco: Never Used  . Alcohol Use: 0.0 oz/week    .25 drink(s) per week     Comment: 1 beer every 3-4 months     Review of Systems  Constitutional: Positive for fever and fatigue. Negative for diaphoresis, appetite change and unexpected weight change.  HENT: Negative for mouth sores.   Eyes: Negative for visual disturbance.  Respiratory: Positive for cough and shortness of breath. Negative for chest tightness and wheezing.   Cardiovascular: Negative for chest pain.  Gastrointestinal: Negative for nausea, vomiting, abdominal pain, diarrhea and  constipation.  Endocrine: Negative for polydipsia, polyphagia and polyuria.  Genitourinary: Negative for dysuria, urgency, frequency and hematuria.  Musculoskeletal: Negative for back pain and neck stiffness.  Skin: Negative for rash.  Allergic/Immunologic: Negative for immunocompromised state.  Neurological: Negative for syncope, light-headedness and headaches.  Hematological: Does not bruise/bleed easily.  Psychiatric/Behavioral: Positive for confusion. Negative for sleep disturbance. The  patient is not nervous/anxious.       Allergies  Carvedilol; Codeine; Dilaudid; Flomax; Levofloxacin; Morphine and related; and Sulfa antibiotics  Home Medications   Current Outpatient Rx  Name  Route  Sig  Dispense  Refill  . aspirin 81 MG EC tablet   Oral   Take 81 mg by mouth daily.           . benzonatate (TESSALON) 100 MG capsule   Oral   Take 100 mg by mouth 3 (three) times daily as needed for cough.         . Cholecalciferol (VITAMIN D PO)   Oral   Take 2,000 Units by mouth daily. Ecotrin         . fenofibrate (TRICOR) 145 MG tablet      TAKE 1 TABLET BY MOUTH DAILY   30 tablet   6   . Fluticasone-Salmeterol (ADVAIR DISKUS) 250-50 MCG/DOSE AEPB   Inhalation   Inhale 1 puff into the lungs 2 (two) times daily.   60 each   3   . furosemide (LASIX) 20 MG tablet   Oral   Take 1 tablet (20 mg total) by mouth daily.   90 tablet   3   . insulin glargine (LANTUS) 100 UNIT/ML injection   Subcutaneous   Inject 20 Units into the skin at bedtime.         Marland Kitchen losartan (COZAAR) 50 MG tablet   Oral   Take 1 tablet (50 mg total) by mouth daily. PATIENT NEEDS OFFICE VISIT FOR ADDITIONAL REFILLS   30 tablet   0   . nitroGLYCERIN (NITRODUR - DOSED IN MG/24 HR) 0.2 mg/hr patch   Transdermal   Place 1 patch (0.2 mg total) onto the skin daily.   30 patch   11   . omeprazole (PRILOSEC) 20 MG capsule      TAKE ONE CAPSULE BY MOUTH EVERY DAY         . pravastatin (PRAVACHOL) 40 MG tablet   Oral   Take 1 tablet (40 mg total) by mouth every evening.   90 tablet   1     90 Day supply acceptable   . PROVENTIL HFA 108 (90 BASE) MCG/ACT inhaler      As directed          BP 92/57  Pulse 96  Temp(Src) 101.9 F (38.8 C) (Oral)  Resp 29  SpO2 93% Physical Exam  Nursing note and vitals reviewed. Constitutional: He is oriented to person, place, and time. He appears well-developed and well-nourished. No distress.  Awake, alert, nontoxic appearance  HENT:   Head: Normocephalic and atraumatic.  Right Ear: Tympanic membrane, external ear and ear canal normal.  Left Ear: Tympanic membrane, external ear and ear canal normal.  Nose: Nose normal. No rhinorrhea. No epistaxis. Right sinus exhibits no maxillary sinus tenderness and no frontal sinus tenderness. Left sinus exhibits no maxillary sinus tenderness and no frontal sinus tenderness.  Mouth/Throat: Uvula is midline, oropharynx is clear and moist and mucous membranes are normal. Mucous membranes are not pale and not cyanotic. No oropharyngeal exudate, posterior oropharyngeal edema,  posterior oropharyngeal erythema or tonsillar abscesses.  Eyes: Conjunctivae are normal. Pupils are equal, round, and reactive to light. No scleral icterus.  Neck: Normal range of motion and full passive range of motion without pain. Neck supple.  Cardiovascular: Regular rhythm, normal heart sounds and intact distal pulses.  Tachycardia present.   Pulses:      Radial pulses are 2+ on the right side, and 2+ on the left side.       Dorsalis pedis pulses are 2+ on the right side, and 2+ on the left side.       Posterior tibial pulses are 2+ on the right side, and 2+ on the left side.  Capillary refill less than 3 seconds Tachycardia  Pulmonary/Chest: Accessory muscle usage present. No stridor. Tachypnea noted. No respiratory distress. He has no decreased breath sounds. He has wheezes in the right lower field. He has no rhonchi. He has rales in the right lower field.  Patient with increased work of breathing with accessory muscle use and tachypnea Wheezes and mild rails heard in the right lung base  Abdominal: Soft. Bowel sounds are normal. He exhibits no mass. There is no tenderness. There is no rebound, no guarding and no CVA tenderness.  Soft and nontender No CVA tenderness  Musculoskeletal: Normal range of motion. He exhibits no edema.  Lymphadenopathy:    He has no cervical adenopathy.  Neurological: He is alert and  oriented to person, place, and time. He exhibits normal muscle tone. Coordination normal.  Speech is clear and goal oriented Moves extremities without ataxia  Skin: Skin is warm and dry. No rash noted. He is not diaphoretic.  Psychiatric: He has a normal mood and affect.    ED Course  Procedures (including critical care time) Labs Review Labs Reviewed  CULTURE, BLOOD (ROUTINE X 2)  CULTURE, BLOOD (ROUTINE X 2)  URINE CULTURE  CBC WITH DIFFERENTIAL  COMPREHENSIVE METABOLIC PANEL  URINALYSIS, ROUTINE W REFLEX MICROSCOPIC  CG4 I-STAT (LACTIC ACID)   Imaging Review Dg Chest Port 1 View  09/10/2013   CLINICAL DATA:  Fever.  Altered mental status.  EXAM: PORTABLE CHEST - 1 VIEW  COMPARISON:  PA and lateral chest 05/19/2012, 07/18/2011 and 03/30/2011.  FINDINGS: Mild, patchy airspace disease is seen in the lung bases. The lungs are otherwise clear. Heart size is normal. No pneumothorax or pleural effusion. AICD is noted.  IMPRESSION: Mild, patchy basilar airspace disease has an appearance most compatible with atelectasis rather than pneumonia.   Electronically Signed   By: Inge Rise M.D.   On: 09/10/2013 19:02    EKG Interpretation   None       MDM   Final diagnoses:  None    Antonio Ray presents from home with fever, confusion and cough after several days of viral URI.  On physical exam patient with increased work of breathing, febrile, Rales and wheezes in the right lung base.  Clinical picture of pneumonia. Will begin sepsis workup.  Patient oxygen saturation 91% on room air, will put on nasal cannula 2 L.  7:54PM Chest x-ray with basilar airspace disease, no discrete pneumonia is seen however patient clinical picture remains consistent with pneumonia. Will begin treatment with azithromycin and Rocephin.  Lactic acid 1.42. Leukocytosis at 13.3.  CMP with elevated BUN and creatinine slightly above baseline with history of chronic kidney disease.  UA and influenza  pending.  9:11 PM UA without nitrites or leukocytes. No evidence of urinary tract infection. Patient has maintained  adequate oxygen saturations with 2 L of oxygen via nasal cannula.  Will proceed with admission.    Pt admitted By Dr. Hal Hope.  Jarrett Soho Mathias Bogacki, PA-C 09/10/13 2204

## 2013-09-10 NOTE — ED Notes (Signed)
Per EMS, pt. Has URI with dry cough. This afternoon around 1400 son noticed AMS. Fever 103.5 with EMS, given 1000mg  tylenol.

## 2013-09-11 ENCOUNTER — Inpatient Hospital Stay (HOSPITAL_COMMUNITY): Payer: Federal, State, Local not specified - PPO

## 2013-09-11 DIAGNOSIS — I509 Heart failure, unspecified: Secondary | ICD-10-CM

## 2013-09-11 DIAGNOSIS — J9601 Acute respiratory failure with hypoxia: Secondary | ICD-10-CM

## 2013-09-11 DIAGNOSIS — D72829 Elevated white blood cell count, unspecified: Secondary | ICD-10-CM

## 2013-09-11 DIAGNOSIS — J1100 Influenza due to unidentified influenza virus with unspecified type of pneumonia: Secondary | ICD-10-CM

## 2013-09-11 DIAGNOSIS — I5022 Chronic systolic (congestive) heart failure: Secondary | ICD-10-CM

## 2013-09-11 LAB — COMPREHENSIVE METABOLIC PANEL
ALT: 56 U/L — ABNORMAL HIGH (ref 0–53)
AST: 51 U/L — AB (ref 0–37)
Albumin: 2.2 g/dL — ABNORMAL LOW (ref 3.5–5.2)
Alkaline Phosphatase: 97 U/L (ref 39–117)
BILIRUBIN TOTAL: 0.4 mg/dL (ref 0.3–1.2)
BUN: 35 mg/dL — ABNORMAL HIGH (ref 6–23)
CHLORIDE: 107 meq/L (ref 96–112)
CO2: 25 mEq/L (ref 19–32)
Calcium: 8.8 mg/dL (ref 8.4–10.5)
Creatinine, Ser: 1.83 mg/dL — ABNORMAL HIGH (ref 0.50–1.35)
GFR, EST AFRICAN AMERICAN: 39 mL/min — AB (ref >90)
GFR, EST NON AFRICAN AMERICAN: 34 mL/min — AB (ref >90)
GLUCOSE: 268 mg/dL — AB (ref 70–99)
Potassium: 4.7 mEq/L (ref 3.7–5.3)
Sodium: 145 mEq/L (ref 137–147)
Total Protein: 6.8 g/dL (ref 6.0–8.3)

## 2013-09-11 LAB — CBC WITH DIFFERENTIAL/PLATELET
Basophils Absolute: 0 10*3/uL (ref 0.0–0.1)
Basophils Relative: 0 % (ref 0–1)
Eosinophils Absolute: 0 10*3/uL (ref 0.0–0.7)
Eosinophils Relative: 0 % (ref 0–5)
HCT: 38.7 % — ABNORMAL LOW (ref 39.0–52.0)
HEMOGLOBIN: 12.5 g/dL — AB (ref 13.0–17.0)
Lymphocytes Relative: 8 % — ABNORMAL LOW (ref 12–46)
Lymphs Abs: 1.1 10*3/uL (ref 0.7–4.0)
MCH: 29.8 pg (ref 26.0–34.0)
MCHC: 32.3 g/dL (ref 30.0–36.0)
MCV: 92.4 fL (ref 78.0–100.0)
MONOS PCT: 8 % (ref 3–12)
Monocytes Absolute: 1.1 10*3/uL — ABNORMAL HIGH (ref 0.1–1.0)
NEUTROS ABS: 12.1 10*3/uL — AB (ref 1.7–7.7)
Neutrophils Relative %: 84 % — ABNORMAL HIGH (ref 43–77)
Platelets: 277 10*3/uL (ref 150–400)
RBC: 4.19 MIL/uL — ABNORMAL LOW (ref 4.22–5.81)
RDW: 14.7 % (ref 11.5–15.5)
WBC: 14.3 10*3/uL — AB (ref 4.0–10.5)

## 2013-09-11 LAB — CBC
HCT: 38.6 % — ABNORMAL LOW (ref 39.0–52.0)
HEMOGLOBIN: 12.6 g/dL — AB (ref 13.0–17.0)
MCH: 29.9 pg (ref 26.0–34.0)
MCHC: 32.6 g/dL (ref 30.0–36.0)
MCV: 91.7 fL (ref 78.0–100.0)
Platelets: 256 10*3/uL (ref 150–400)
RBC: 4.21 MIL/uL — ABNORMAL LOW (ref 4.22–5.81)
RDW: 14.5 % (ref 11.5–15.5)
WBC: 13.5 10*3/uL — ABNORMAL HIGH (ref 4.0–10.5)

## 2013-09-11 LAB — CREATININE, SERUM
CREATININE: 1.82 mg/dL — AB (ref 0.50–1.35)
GFR calc Af Amer: 39 mL/min — ABNORMAL LOW (ref >90)
GFR calc non Af Amer: 34 mL/min — ABNORMAL LOW (ref >90)

## 2013-09-11 LAB — GLUCOSE, CAPILLARY
GLUCOSE-CAPILLARY: 261 mg/dL — AB (ref 70–99)
Glucose-Capillary: 182 mg/dL — ABNORMAL HIGH (ref 70–99)
Glucose-Capillary: 187 mg/dL — ABNORMAL HIGH (ref 70–99)
Glucose-Capillary: 215 mg/dL — ABNORMAL HIGH (ref 70–99)
Glucose-Capillary: 271 mg/dL — ABNORMAL HIGH (ref 70–99)

## 2013-09-11 LAB — LEGIONELLA ANTIGEN, URINE: Legionella Antigen, Urine: NEGATIVE

## 2013-09-11 LAB — STREP PNEUMONIAE URINARY ANTIGEN: STREP PNEUMO URINARY ANTIGEN: NEGATIVE

## 2013-09-11 LAB — URINE CULTURE
COLONY COUNT: NO GROWTH
Culture: NO GROWTH

## 2013-09-11 LAB — HEMOGLOBIN A1C
Hgb A1c MFr Bld: 10.7 % — ABNORMAL HIGH (ref <5.7)
Mean Plasma Glucose: 260 mg/dL — ABNORMAL HIGH (ref <117)

## 2013-09-11 MED ORDER — GUAIFENESIN 100 MG/5ML PO SYRP
200.0000 mg | ORAL_SOLUTION | ORAL | Status: DC | PRN
  Filled 2013-09-11: qty 10

## 2013-09-11 MED ORDER — INSULIN ASPART 100 UNIT/ML ~~LOC~~ SOLN
3.0000 [IU] | Freq: Three times a day (TID) | SUBCUTANEOUS | Status: DC
  Administered 2013-09-11 – 2013-09-15 (×10): 3 [IU] via SUBCUTANEOUS

## 2013-09-11 MED ORDER — PREDNISONE 10 MG PO TABS
60.0000 mg | ORAL_TABLET | Freq: Every day | ORAL | Status: DC
  Administered 2013-09-11 – 2013-09-13 (×2): 60 mg via ORAL
  Filled 2013-09-11 (×3): qty 1

## 2013-09-11 MED ORDER — AZITHROMYCIN 500 MG PO TABS
500.0000 mg | ORAL_TABLET | Freq: Every day | ORAL | Status: AC
  Administered 2013-09-11 – 2013-09-12 (×2): 500 mg via ORAL
  Filled 2013-09-11 (×2): qty 1

## 2013-09-11 MED ORDER — BENZONATATE 100 MG PO CAPS
100.0000 mg | ORAL_CAPSULE | Freq: Three times a day (TID) | ORAL | Status: DC
  Administered 2013-09-11 – 2013-09-15 (×10): 100 mg via ORAL
  Filled 2013-09-11 (×14): qty 1

## 2013-09-11 MED ORDER — IPRATROPIUM-ALBUTEROL 0.5-2.5 (3) MG/3ML IN SOLN
3.0000 mL | Freq: Four times a day (QID) | RESPIRATORY_TRACT | Status: DC
  Administered 2013-09-11 – 2013-09-12 (×5): 3 mL via RESPIRATORY_TRACT
  Filled 2013-09-11 (×6): qty 3

## 2013-09-11 NOTE — Progress Notes (Addendum)
Notified by RN that patient is having some hemoptysis.  Likely related to flu and pneumonia.  Consider acute COPD exac triggered by flu -  D/c heparin and start SCDs -  Repeat CXR now instead of in AM  start prednisone taper   Add guaifenesin and tessalon

## 2013-09-11 NOTE — Progress Notes (Signed)
Utilization review completed.  

## 2013-09-11 NOTE — Progress Notes (Signed)
Inpatient Diabetes Program Recommendations  AACE/ADA: New Consensus Statement on Inpatient Glycemic Control (2013)  Target Ranges:  Prepandial:   less than 140 mg/dL      Peak postprandial:   less than 180 mg/dL (1-2 hours)      Critically ill patients:  140 - 180 mg/dL   Reason for Visit: Results for Antonio Ray, Antonio Ray (MRN [id number]) as of 09/11/2013 12:50  Ref. Range 09/11/2013 00:03 09/11/2013 06:33 09/11/2013 11:20  Glucose-Capillary Latest Range: 70-99 mg/dL 271 (H) 215 (H) 261 (H)   Diabetes history: Type 2 diabetes Outpatient Diabetes medications: Lantus 20 units q HS-No recent A1C available Current orders for Inpatient glycemic control: Sensitive correction tid with meals, Lantus 20 units q HS  Please consider checking A1C to determine prehospitalization glycemic control.  Also consider adding Novolog meal coverage 3 units tid with meals.    Thanks, Adah Perl, RN, BC-ADM Inpatient Diabetes Coordinator Pager [phone]

## 2013-09-11 NOTE — Progress Notes (Signed)
[facility] PROGRESS NOTE  EARLY STEEL YIR:[id number] DOB: [birth year] DOA: 09/10/2013 PCP: Hayden Rasmussen., MD  Assessment/Plan  Acute encephalopathy - most likely secondary to dehydration and flu and resolving  Acute hypoxic respiratory failure due to Influenza with pneumonia with rising WBC count and low BP concerning for sepsis -  Continue antibiotics for now pending blood cultures -  Continue tamiflu -  S. pneumo neg -  Legionella neg -  BCx NGTD -  Repeat CXR in AM -  IS -  Wean to RA as tolerated  Acute on chronic renal failure due to dehydration from confusion and poor orla intake, baseline creatinine 1.5 -  Patient drinking well -  Continue to hold lasix today and restart tomorrow if creatinine near baseline  Chronic systolic heart failure with last EF measured was 30-35% status post AICD placement -  Continue ASA -  Not on beta blocker due to low BP -  Not on ACEI due to AKI and low BP  CAD status post stenting, chest pain free.  Continue ASA and statin -  Not on beta blocker due to low BP -  NTG patch - placed hold parameters for blood pressure -  Tele:  NSR with PVC -  Okay to d/c telemetry  Diabetes mellitus type 2 -  Hemoglobin A1c -  Continue lantus 20 units -  Continue SSI low dose -  Add aspart with meals  Leukocytosis, rising despite antibiotics and Tamiflu -  If worsening fevers, blood pressure lowering, signs of progressive sepsis, consider escalating antibiotics to include treatment for MRSA  Diet:  Diabetic Access:  PIV IVF:  OFF Proph:  heparin  Code Status: full Family Communication: patient alone Disposition Plan: pending patient weaned to RA, breathing improved, blood pressure stable   Consultants:  None  Procedures:  Chest x-ray  Antibiotics:  Ceftriaxone from 2/10   Azithromycin from 2/10  HPI/Subjective:  Patient states that he feels still ill with some muscle aches and pains, cough which is deep but  nonproductive, and generalized malaise.  Mild sore throat  Objective: Filed Vitals:   09/11/13 0514 09/11/13 0631 09/11/13 0911 09/11/13 0927  BP:  106/66 99/52   Pulse:  89  88  Temp:  98.6 F (37 C)    TempSrc:  Oral    Resp:  22  20  Height:      Weight:  84.188 kg (185 lb 9.6 oz)    SpO2: 95% 94%  100%    Intake/Output Summary (Last 24 hours) at 09/11/13 1548 Last data filed at 09/11/13 1331  Gross per 24 hour  Intake   1693 ml  Output    805 ml  Net    888 ml   Filed Weights   09/10/13 2300 09/11/13 0631  Weight: 84.868 kg (187 lb 1.6 oz) 84.188 kg (185 lb 9.6 oz)    Exam:   General:  CM, No acute distress  HEENT:  NCAT, MMM  Cardiovascular:  Distant heart sounds, RRR, nl S1, S2 no obvious mrg, 2+ pulses, warm extremities  Respiratory:  Diminished bilateral breath sounds without focal rales or rhonchi or wheeze, mild tachypnea   Abdomen:   NABS, soft, NT/ND  MSK:   Normal tone and bulk, no LEE  Neuro:  Grossly intact  Data Reviewed: Basic Metabolic Panel:  Recent Labs Lab 09/10/13 1833 09/11/13 0142 09/11/13 0416  NA 144  --  145  K 3.8  --  4.7  CL 103  --  107  CO2 26  --  25  GLUCOSE 272*  --  268*  BUN 43*  --  35*  CREATININE 2.15* 1.82* 1.83*  CALCIUM 9.2  --  8.8   Liver Function Tests:  Recent Labs Lab 09/10/13 1833 09/11/13 0416  AST 42* 51*  ALT 51 56*  ALKPHOS 79 97  BILITOT 0.4 0.4  PROT 7.2 6.8  ALBUMIN 2.7* 2.2*   No results found for this basename: LIPASE, AMYLASE,  in the last 168 hours No results found for this basename: AMMONIA,  in the last 168 hours CBC:  Recent Labs Lab 09/10/13 1833 09/11/13 0142 09/11/13 0416  WBC 13.3* 13.5* 14.3*  NEUTROABS 11.3*  --  12.1*  HGB 13.2 12.6* 12.5*  HCT 39.4 38.6* 38.7*  MCV 89.5 91.7 92.4  PLT 260 256 277   Cardiac Enzymes: No results found for this basename: CKTOTAL, CKMB, CKMBINDEX, TROPONINI,  in the last 168 hours BNP (last 3 results) No results found for  this basename: PROBNP,  in the last 8760 hours CBG:  Recent Labs Lab 09/11/13 0003 09/11/13 0633 09/11/13 1120  GLUCAP 271* 215* 261*    No results found for this or any previous visit (from the past 240 hour(s)).   Studies: Dg Chest Port 1 View  09/10/2013   CLINICAL DATA:  Fever.  Altered mental status.  EXAM: PORTABLE CHEST - 1 VIEW  COMPARISON:  PA and lateral chest 05/19/2012, 07/18/2011 and 03/30/2011.  FINDINGS: Mild, patchy airspace disease is seen in the lung bases. The lungs are otherwise clear. Heart size is normal. No pneumothorax or pleural effusion. AICD is noted.  IMPRESSION: Mild, patchy basilar airspace disease has an appearance most compatible with atelectasis rather than pneumonia.   Electronically Signed   By: Inge Rise M.D.   On: 09/10/2013 19:02    Scheduled Meds: . aspirin EC  81 mg Oral Daily  . azithromycin  500 mg Intravenous Q24H  . cefTRIAXone (ROCEPHIN)  IV  1 g Intravenous Q24H  . fenofibrate  160 mg Oral Daily  . heparin  5,000 Units Subcutaneous 3 times per day  . insulin aspart  0-9 Units Subcutaneous TID WC  . insulin glargine  20 Units Subcutaneous QHS  . ipratropium-albuterol  3 mL Nebulization Q6H  . mometasone-formoterol  2 puff Inhalation BID  . nitroGLYCERIN  0.2 mg Transdermal Daily  . oseltamivir  30 mg Oral BID  . pantoprazole  40 mg Oral Daily  . simvastatin  20 mg Oral q1800  . sodium chloride  3 mL Intravenous Q12H  . sodium chloride  3 mL Intravenous Q12H   Continuous Infusions:   Principal Problem:   Acute encephalopathy Active Problems:   Coronary artery disease   Chronic systolic congestive heart failure   Diabetes mellitus type II, non insulin dependent   Pneumonia, community acquired   Renal failure (ARF), acute on chronic    Time spent: 30 min    Dawanda Mapel, [place] [facility] Pager [phone]. If 7PM-7AM, please contact night-coverage at [email or web address], password [facility] 09/11/2013, 3:48 PM  LOS: 1 day

## 2013-09-11 NOTE — Progress Notes (Signed)
Pt coughed up small amount of blood tinged sputum with thick frothy mucus.  Sputum saved for MD to assess.  MD has been made aware. Orlean Holtrop RN 

## 2013-09-11 NOTE — ED Provider Notes (Signed)
Medical screening examination/treatment/procedure(s) were conducted as a shared visit with non-physician practitioner(s) and myself.  I personally evaluated the patient during the encounter.    patient with history and physical c/w PNA. Has had intermittent delirium throughout the day. Stable at this time, most likely has a bacterial infection on top of prior viral sx. Due to intermittent AMS, age and comorbidities will need IV abx and admission.  Ephraim Hamburger, MD 09/11/13 [phone]

## 2013-09-12 DIAGNOSIS — J449 Chronic obstructive pulmonary disease, unspecified: Secondary | ICD-10-CM

## 2013-09-12 DIAGNOSIS — J4489 Other specified chronic obstructive pulmonary disease: Secondary | ICD-10-CM

## 2013-09-12 DIAGNOSIS — J1100 Influenza due to unidentified influenza virus with unspecified type of pneumonia: Secondary | ICD-10-CM

## 2013-09-12 DIAGNOSIS — N183 Chronic kidney disease, stage 3 unspecified: Secondary | ICD-10-CM

## 2013-09-12 LAB — BASIC METABOLIC PANEL
BUN: 33 mg/dL — AB (ref 6–23)
CHLORIDE: 107 meq/L (ref 96–112)
CO2: 28 mEq/L (ref 19–32)
Calcium: 8.9 mg/dL (ref 8.4–10.5)
Creatinine, Ser: 1.43 mg/dL — ABNORMAL HIGH (ref 0.50–1.35)
GFR calc Af Amer: 53 mL/min — ABNORMAL LOW (ref >90)
GFR calc non Af Amer: 45 mL/min — ABNORMAL LOW (ref >90)
GLUCOSE: 253 mg/dL — AB (ref 70–99)
POTASSIUM: 5.2 meq/L (ref 3.7–5.3)
Sodium: 147 mEq/L (ref 137–147)

## 2013-09-12 LAB — GLUCOSE, CAPILLARY
GLUCOSE-CAPILLARY: 223 mg/dL — AB (ref 70–99)
GLUCOSE-CAPILLARY: 281 mg/dL — AB (ref 70–99)
Glucose-Capillary: 266 mg/dL — ABNORMAL HIGH (ref 70–99)
Glucose-Capillary: 216 mg/dL — ABNORMAL HIGH (ref 70–99)

## 2013-09-12 LAB — CBC
HEMATOCRIT: 40.2 % (ref 39.0–52.0)
HEMOGLOBIN: 12.6 g/dL — AB (ref 13.0–17.0)
MCH: 29.8 pg (ref 26.0–34.0)
MCHC: 31.3 g/dL (ref 30.0–36.0)
MCV: 95 fL (ref 78.0–100.0)
Platelets: 259 10*3/uL (ref 150–400)
RBC: 4.23 MIL/uL (ref 4.22–5.81)
RDW: 14.9 % (ref 11.5–15.5)
WBC: 12.9 10*3/uL — ABNORMAL HIGH (ref 4.0–10.5)

## 2013-09-12 MED ORDER — INSULIN GLARGINE 100 UNIT/ML ~~LOC~~ SOLN
23.0000 [IU] | Freq: Every day | SUBCUTANEOUS | Status: DC
  Administered 2013-09-12 – 2013-09-13 (×2): 23 [IU] via SUBCUTANEOUS
  Filled 2013-09-12 (×5): qty 0.23

## 2013-09-12 NOTE — Progress Notes (Signed)
The patient is alert, but is only oriented to self.  Otherwise, he did not have any acute changes or complaints of pain overnight.

## 2013-09-12 NOTE — Progress Notes (Signed)
Inpatient Diabetes Program Recommendations  AACE/ADA: New Consensus Statement on Inpatient Glycemic Control (2013)  Target Ranges:  Prepandial:   less than 140 mg/dL      Peak postprandial:   less than 180 mg/dL (1-2 hours)      Critically ill patients:  140 - 180 mg/dL   Reason for Assessment: Note A1C=10.7% Results for CLEOTIS, SPARR (MRN [id number]) as of 09/12/2013 13:17  Ref. Range 09/11/2013 11:20 09/11/2013 17:09 09/11/2013 21:06 09/12/2013 07:16 09/12/2013 11:14  Glucose-Capillary Latest Range: 70-99 mg/dL 261 (H) 182 (H) 187 (H) 223 (H) 266 (H)   Diabetes history: Type 2 Outpatient Diabetes medications: Lantus 20 units daily Current orders for Inpatient glycemic control: Lantus 20 units daily, Sensitive Novolog tid with meals, Novolog 3 units tid with meals  Needs close follow-up with PCP regarding elevated A1C.    Consider increasing Lantus to 26 units daily and increase Novolog meal coverage to 5 units tid with meals.  Adah Perl, RN, BC-ADM Inpatient Diabetes Coordinator Pager [phone]

## 2013-09-12 NOTE — Progress Notes (Signed)
[facility] PROGRESS NOTE  Antonio Ray UDJ:[id number] DOB: Jul 23, 1934 DOA: 09/10/2013 PCP: Hayden Rasmussen., MD  Assessment/Plan  1.Acute encephalopathy  - most likely secondary to dehydration and infection -Improving clinically, follow 2. Influenza A. with pneumonia and sepsis syndrome -Continue Tamiflu and empiric antibiotics with Rocephin and Zithromax -Blood and urine cultures with no growth to date, repeat chest x-ray on 1/11 with increased airspace infiltrate on right -Improving clinically, WBC trending down, afebrile overnight and hemodynamically stable -  S. pneumo neg -  Legionella neg -  BCx NGTD 2.Acute hypoxic respiratory failure -Secondary to above, see treatment as discussed above - Continue supplemental oxygen, follow and Wean to RA as tolerated  3.Acute on chronic renal failure due to dehydration from confusion and poor orla intake, baseline creatinine 1.5 -Improving of Lasix, creatinine 1.43  4.Chronic systolic heart failure with last EF measured was 30-35% status post AICD placement -  Continue ASA -  Not on beta blocker due to low BP(BP soft is stable today follow) -  Not on ACEI due to AKI and low BP -Follow and consider restarting Lasix in a.m. if by mouth intake at baseline  CAD status post stenting, chest pain free.  Continue ASA and statin -  Not on beta blocker due to low BP -  NTG patch - placed hold parameters for blood pressure -  Tele:  NSR with PVC   Diabetes mellitus type 2, poorly controlled -  Hemoglobin A1c 10.7 -  Continue increase Lantus, follow and further adjust as clinically appropriate -  Continue SSI low dose -  Add aspart with meals   Diet:  Diabetic Access:  PIV IVF:  OFF Proph:  heparin  Code Status: full Family Communication: Son at bedside Disposition Plan: To home when medically ready   Consultants:  None  Procedures:  Chest x-ray  Antibiotics:  Ceftriaxone from 2/10   Azithromycin from  2/10  HPI/Subjective:  Somnolent but easily aroused, she denies any new complaints.  Objective: Filed Vitals:   09/12/13 0900 09/12/13 1300 09/12/13 1355 09/12/13 1452  BP: 113/78 107/56  117/53  Pulse: 78 80  81  Temp: 97.8 F (36.6 C) 97.7 F (36.5 C)  97.7 F (36.5 C)  TempSrc: Oral Oral  Oral  Resp: 28 28  22   Height:      Weight:      SpO2: 97% 99% 99% 99%    Intake/Output Summary (Last 24 hours) at 09/12/13 1718 Last data filed at 09/12/13 1309  Gross per 24 hour  Intake    960 ml  Output    625 ml  Net    335 ml   Filed Weights   09/10/13 2300 09/11/13 0631 09/12/13 0717  Weight: 84.868 kg (187 lb 1.6 oz) 84.188 kg (185 lb 9.6 oz) 84.188 kg (185 lb 9.6 oz)    Exam:   General:  Somnolent but easily aroused, in NAD  HEENT:  NCAT, MMM  Cardiovascular:  Distant heart sounds, RRR, nl S1, S2 no obvious mrg, 2+ pulses, warm extremities  Respiratory:  Diminished bilateral breath sounds without focal rales or rhonchi or wheeze, respirations not  Abdomen:   NABS, soft, NT/ND  EXTREMITIES: No cyanosis no edema  Neuro:  Grossly intact  Data Reviewed: Basic Metabolic Panel:  Recent Labs Lab 09/10/13 1833 09/11/13 0142 09/11/13 0416 09/12/13 0646  NA 144  --  145 147  K 3.8  --  4.7 5.2  CL 103  --  107 107  CO2  26  --  25 28  GLUCOSE 272*  --  268* 253*  BUN 43*  --  35* 33*  CREATININE 2.15* 1.82* 1.83* 1.43*  CALCIUM 9.2  --  8.8 8.9   Liver Function Tests:  Recent Labs Lab 09/10/13 1833 09/11/13 0416  AST 42* 51*  ALT 51 56*  ALKPHOS 79 97  BILITOT 0.4 0.4  PROT 7.2 6.8  ALBUMIN 2.7* 2.2*   No results found for this basename: LIPASE, AMYLASE,  in the last 168 hours No results found for this basename: AMMONIA,  in the last 168 hours CBC:  Recent Labs Lab 09/10/13 1833 09/11/13 0142 09/11/13 0416 09/12/13 0646  WBC 13.3* 13.5* 14.3* 12.9*  NEUTROABS 11.3*  --  12.1*  --   HGB 13.2 12.6* 12.5* 12.6*  HCT 39.4 38.6* 38.7* 40.2   MCV 89.5 91.7 92.4 95.0  PLT 260 256 277 259   Cardiac Enzymes: No results found for this basename: CKTOTAL, CKMB, CKMBINDEX, TROPONINI,  in the last 168 hours BNP (last 3 results) No results found for this basename: PROBNP,  in the last 8760 hours CBG:  Recent Labs Lab 09/11/13 1709 09/11/13 2106 09/12/13 0716 09/12/13 1114 09/12/13 1533  GLUCAP 182* 187* 223* 266* 281*    Recent Results (from the past 240 hour(s))  CULTURE, BLOOD (ROUTINE X 2)     Status: None   Collection Time    09/10/13  6:45 PM      Result Value Ref Range Status   Specimen Description BLOOD ARM RIGHT   Final   Special Requests BOTTLES DRAWN AEROBIC AND ANAEROBIC 10CC   Final   Culture  Setup Time     Final   Value: 09/11/2013 03:32     Performed at [facility]   Culture     Final   Value:        BLOOD CULTURE RECEIVED NO GROWTH TO DATE CULTURE WILL BE HELD FOR 5 DAYS BEFORE ISSUING A FINAL NEGATIVE REPORT     Performed at [facility]   Report Status PENDING   Incomplete  CULTURE, BLOOD (ROUTINE X 2)     Status: None   Collection Time    09/10/13  6:50 PM      Result Value Ref Range Status   Specimen Description BLOOD HAND RIGHT   Final   Special Requests BOTTLES DRAWN AEROBIC ONLY 10CC   Final   Culture  Setup Time     Final   Value: 09/11/2013 03:31     Performed at [facility]   Culture     Final   Value:        BLOOD CULTURE RECEIVED NO GROWTH TO DATE CULTURE WILL BE HELD FOR 5 DAYS BEFORE ISSUING A FINAL NEGATIVE REPORT     Performed at [facility]   Report Status PENDING   Incomplete  URINE CULTURE     Status: None   Collection Time    09/10/13  8:27 PM      Result Value Ref Range Status   Specimen Description URINE, CLEAN CATCH   Final   Special Requests NONE   Final   Culture  Setup Time     Final   Value: 09/10/2013 22:00     Performed at [facility]     Final   Value: NO GROWTH     Performed at [facility]  Final   Value: NO GROWTH     Performed at [facility]   Report Status 09/11/2013 FINAL   Final     Studies: Dg Chest Port 1 View  09/11/2013   CLINICAL DATA:  New onset hemoptysis  EXAM: PORTABLE CHEST - 1 VIEW  COMPARISON:  09/10/2013  FINDINGS: Heart size and vascular pattern normal. Stable cardiac pacer. Aortic arch calcification stable. Left lung shows mild stable lower lobe scarring or atelectasis. In the right lower lobe laterally, there is patchy airspace disease. This is slightly more prominent when compared to 09/10/13  IMPRESSION: Persistent stable airspace disease at left base most consistent with atelectasis although infiltrate is not excluded. On the right, there is increased airspace infiltrate. Findings suggest pneumonitis.   Electronically Signed   By: Skipper Cliche M.D.   On: 09/11/2013 19:07   Dg Chest Port 1 View  09/10/2013   CLINICAL DATA:  Fever.  Altered mental status.  EXAM: PORTABLE CHEST - 1 VIEW  COMPARISON:  PA and lateral chest 05/19/2012, 07/18/2011 and 03/30/2011.  FINDINGS: Mild, patchy airspace disease is seen in the lung bases. The lungs are otherwise clear. Heart size is normal. No pneumothorax or pleural effusion. AICD is noted.  IMPRESSION: Mild, patchy basilar airspace disease has an appearance most compatible with atelectasis rather than pneumonia.   Electronically Signed   By: Inge Rise M.D.   On: 09/10/2013 19:02    Scheduled Meds: . aspirin EC  81 mg Oral Daily  . azithromycin  500 mg Oral Daily  . benzonatate  100 mg Oral TID  . cefTRIAXone (ROCEPHIN)  IV  1 g Intravenous Q24H  . fenofibrate  160 mg Oral Daily  . insulin aspart  0-9 Units Subcutaneous TID WC  . insulin aspart  3 Units Subcutaneous TID WC  . insulin glargine  20 Units Subcutaneous QHS  . ipratropium-albuterol  3 mL Nebulization Q6H  . mometasone-formoterol  2 puff Inhalation BID  . nitroGLYCERIN  0.2 mg Transdermal Daily  . oseltamivir  30 mg Oral BID   . pantoprazole  40 mg Oral Daily  . predniSONE  60 mg Oral Q breakfast  . simvastatin  20 mg Oral q1800  . sodium chloride  3 mL Intravenous Q12H  . sodium chloride  3 mL Intravenous Q12H   Continuous Infusions:   Principal Problem:   Acute encephalopathy Active Problems:   Coronary artery disease   Chronic systolic congestive heart failure   Diabetes mellitus type II, non insulin dependent   Pneumonia, community acquired   Renal failure (ARF), acute on chronic   Influenza with pneumonia   Leukocytosis, unspecified   Acute respiratory failure with hypoxia    Time spent: 25 min    [place] [facility] Pager [phone]. If 7PM-7AM, please contact night-coverage at [email or web address], password [facility] 09/12/2013, 5:18 PM  LOS: 2 days

## 2013-09-13 DIAGNOSIS — J9600 Acute respiratory failure, unspecified whether with hypoxia or hypercapnia: Secondary | ICD-10-CM

## 2013-09-13 LAB — CBC
HEMATOCRIT: 36.9 % — AB (ref 39.0–52.0)
Hemoglobin: 11.5 g/dL — ABNORMAL LOW (ref 13.0–17.0)
MCH: 29.2 pg (ref 26.0–34.0)
MCHC: 31.2 g/dL (ref 30.0–36.0)
MCV: 93.7 fL (ref 78.0–100.0)
Platelets: 271 10*3/uL (ref 150–400)
RBC: 3.94 MIL/uL — ABNORMAL LOW (ref 4.22–5.81)
RDW: 14.5 % (ref 11.5–15.5)
WBC: 12.7 10*3/uL — ABNORMAL HIGH (ref 4.0–10.5)

## 2013-09-13 LAB — GLUCOSE, CAPILLARY
GLUCOSE-CAPILLARY: 296 mg/dL — AB (ref 70–99)
Glucose-Capillary: 232 mg/dL — ABNORMAL HIGH (ref 70–99)
Glucose-Capillary: 186 mg/dL — ABNORMAL HIGH (ref 70–99)
Glucose-Capillary: 218 mg/dL — ABNORMAL HIGH (ref 70–99)

## 2013-09-13 LAB — BASIC METABOLIC PANEL
BUN: 37 mg/dL — AB (ref 6–23)
CALCIUM: 9.2 mg/dL (ref 8.4–10.5)
CHLORIDE: 105 meq/L (ref 96–112)
CO2: 32 mEq/L (ref 19–32)
Creatinine, Ser: 1.27 mg/dL (ref 0.50–1.35)
GFR, EST AFRICAN AMERICAN: 61 mL/min — AB (ref >90)
GFR, EST NON AFRICAN AMERICAN: 52 mL/min — AB (ref >90)
Glucose, Bld: 284 mg/dL — ABNORMAL HIGH (ref 70–99)
Potassium: 4.8 mEq/L (ref 3.7–5.3)
Sodium: 145 mEq/L (ref 137–147)

## 2013-09-13 MED ORDER — PREDNISONE 20 MG PO TABS
40.0000 mg | ORAL_TABLET | Freq: Every day | ORAL | Status: DC
  Administered 2013-09-14 – 2013-09-15 (×2): 40 mg via ORAL
  Filled 2013-09-13 (×3): qty 2

## 2013-09-13 MED ORDER — ALBUTEROL SULFATE (2.5 MG/3ML) 0.083% IN NEBU: 2.5000 mg | INHALATION_SOLUTION | Freq: Four times a day (QID) | RESPIRATORY_TRACT | Status: DC | PRN

## 2013-09-13 MED ORDER — BUDESONIDE 0.25 MG/2ML IN SUSP
0.2500 mg | Freq: Two times a day (BID) | RESPIRATORY_TRACT | Status: DC
  Administered 2013-09-13 – 2013-09-15 (×3): 0.25 mg via RESPIRATORY_TRACT
  Filled 2013-09-13 (×6): qty 2

## 2013-09-13 MED ORDER — IPRATROPIUM-ALBUTEROL 0.5-2.5 (3) MG/3ML IN SOLN
3.0000 mL | Freq: Two times a day (BID) | RESPIRATORY_TRACT | Status: DC
  Administered 2013-09-13 – 2013-09-15 (×4): 3 mL via RESPIRATORY_TRACT
  Filled 2013-09-13 (×5): qty 3

## 2013-09-13 MED ORDER — BUDESONIDE 0.25 MG/2ML IN SUSP
0.2500 mg | Freq: Two times a day (BID) | RESPIRATORY_TRACT | Status: DC
  Filled 2013-09-13 (×3): qty 2

## 2013-09-13 MED ORDER — FUROSEMIDE 20 MG PO TABS
20.0000 mg | ORAL_TABLET | Freq: Every day | ORAL | Status: DC
  Administered 2013-09-13 – 2013-09-15 (×3): 20 mg via ORAL
  Filled 2013-09-13 (×3): qty 1

## 2013-09-13 NOTE — Progress Notes (Signed)
[facility] PROGRESS NOTE  KMARION RAWL ZDG:[id number] DOB: 1933-10-12 DOA: 09/10/2013 PCP: Hayden Rasmussen., MD  Assessment/Plan  1-Acute encephalopathy  - most likely secondary to dehydration and infection -Improving clinically, follow clinical response.  2-Influenza A. with pneumonia, COPD exacerbation and sepsis syndrome -Continue Tamiflu and empiric antibiotics with Rocephin and Zithromax -Blood and urine cultures with no growth to date, repeat chest x-ray on 2/11 with increased airspace infiltrate on right -Improving clinically, WBC trending down, afebrile overnight and hemodynamically stable -  S. pneumo neg -  Legionella neg -  BCx NGTD -will start tapering steroids, continue nebulizer treatment, start pulmicort -will continue antitussives  -start flutter valve  3-Acute hypoxic respiratory failure -Secondary to above, see treatment as discussed above - Continue supplemental oxygen, follow and Wean to RA as tolerated  4-Acute on chronic renal failure due to dehydration from confusion and poor orla intake, baseline creatinine 1.5 -back to baseline -will resume lasix -follow BMET  5-Chronic systolic heart failure with last EF measured was 30-35% status post AICD placement -  Continue ASA -  Not on beta blocker due to low BP(BP soft is stable today follow) -Not on ACEI due to AKI and low BP -will resume lasix  6-CAD status post stenting, chest pain free.  Continue ASA and statin -  Not on beta blocker due to low BP -  NTG patch - placed hold parameters for blood pressure -  Tele:  NSR with non sustained PVC  7-Diabetes mellitus type 2, poorly controlled -  Hemoglobin A1c 10.7 -  Continue increase Lantus, follow and further adjust as clinically appropriate -  Continue SSI low dose  Diet:  Diabetic Proph:  heparin  Code Status: full Family Communication: no family at bedside Disposition Plan: To home when medically  ready   Consultants:  None  Procedures:  Chest x-ray  Antibiotics:  Ceftriaxone from 2/10   Azithromycin from 2/10  HPI/Subjective: Afebrile, feeling and breathing better. Patient with intermittent episodes of confusion appreciated by nursing staff; but overall better.   Objective: Filed Vitals:   09/13/13 0616 09/13/13 0731 09/13/13 1433 09/13/13 1440  BP: 115/54   129/79  Pulse: 87   87  Temp: 97.7 F (36.5 C)   98.3 F (36.8 C)  TempSrc: Oral   Oral  Resp: 22   20  Height:      Weight: 84.324 kg (185 lb 14.4 oz)     SpO2: 95% 96% 96% 95%    Intake/Output Summary (Last 24 hours) at 09/13/13 1752 Last data filed at 09/13/13 1401  Gross per 24 hour  Intake    770 ml  Output    790 ml  Net    -20 ml   Filed Weights   09/11/13 0631 09/12/13 0717 09/13/13 0616  Weight: 84.188 kg (185 lb 9.6 oz) 84.188 kg (185 lb 9.6 oz) 84.324 kg (185 lb 14.4 oz)    Exam:   General:  afebrile, in NAD  HEENT:  NCAT, MMM  Cardiovascular:  Distant heart sounds, RRR, nl S1, S2 no obvious mrg, 2+ pulses, warm extremities  Respiratory:  Improved air movement; positive rhonchi and wheezing  Abdomen:   +BS, soft, NT/ND  EXTREMITIES: No cyanosis no edema  Neuro:  Grossly intact  Data Reviewed: Basic Metabolic Panel:  Recent Labs Lab 09/10/13 1833 09/11/13 0142 09/11/13 0416 09/12/13 0646 09/13/13 0447  NA 144  --  145 147 145  K 3.8  --  4.7 5.2 4.8  CL 103  --  107 107 105  CO2 26  --  25 28 32  GLUCOSE 272*  --  268* 253* 284*  BUN 43*  --  35* 33* 37*  CREATININE 2.15* 1.82* 1.83* 1.43* 1.27  CALCIUM 9.2  --  8.8 8.9 9.2   Liver Function Tests:  Recent Labs Lab 09/10/13 1833 09/11/13 0416  AST 42* 51*  ALT 51 56*  ALKPHOS 79 97  BILITOT 0.4 0.4  PROT 7.2 6.8  ALBUMIN 2.7* 2.2*   CBC:  Recent Labs Lab 09/10/13 1833 09/11/13 0142 09/11/13 0416 09/12/13 0646 09/13/13 0447  WBC 13.3* 13.5* 14.3* 12.9* 12.7*  NEUTROABS 11.3*  --  12.1*  --    --   HGB 13.2 12.6* 12.5* 12.6* 11.5*  HCT 39.4 38.6* 38.7* 40.2 36.9*  MCV 89.5 91.7 92.4 95.0 93.7  PLT 260 256 277 259 271   BNP (last 3 results) No results found for this basename: PROBNP,  in the last 8760 hours CBG:  Recent Labs Lab 09/12/13 1533 09/12/13 2041 09/13/13 0610 09/13/13 1055 09/13/13 1703  GLUCAP 281* 216* 232* 186* 296*    Recent Results (from the past 240 hour(s))  CULTURE, BLOOD (ROUTINE X 2)     Status: None   Collection Time    09/10/13  6:45 PM      Result Value Ref Range Status   Specimen Description BLOOD ARM RIGHT   Final   Special Requests BOTTLES DRAWN AEROBIC AND ANAEROBIC 10CC   Final   Culture  Setup Time     Final   Value: 09/11/2013 03:32     Performed at [facility]   Culture     Final   Value:        BLOOD CULTURE RECEIVED NO GROWTH TO DATE CULTURE WILL BE HELD FOR 5 DAYS BEFORE ISSUING A FINAL NEGATIVE REPORT     Performed at [facility]   Report Status PENDING   Incomplete  CULTURE, BLOOD (ROUTINE X 2)     Status: None   Collection Time    09/10/13  6:50 PM      Result Value Ref Range Status   Specimen Description BLOOD HAND RIGHT   Final   Special Requests BOTTLES DRAWN AEROBIC ONLY 10CC   Final   Culture  Setup Time     Final   Value: 09/11/2013 03:31     Performed at [facility]   Culture     Final   Value:        BLOOD CULTURE RECEIVED NO GROWTH TO DATE CULTURE WILL BE HELD FOR 5 DAYS BEFORE ISSUING A FINAL NEGATIVE REPORT     Performed at [facility]   Report Status PENDING   Incomplete  URINE CULTURE     Status: None   Collection Time    09/10/13  8:27 PM      Result Value Ref Range Status   Specimen Description URINE, CLEAN CATCH   Final   Special Requests NONE   Final   Culture  Setup Time     Final   Value: 09/10/2013 22:00     Performed at [place]     Final   Value: NO GROWTH     Performed at [place]     Final   Value: NO  GROWTH     Performed at [facility]   Report Status 09/11/2013 FINAL   Final  Studies: Dg Chest Port 1 View  09/11/2013   CLINICAL DATA:  New onset hemoptysis  EXAM: PORTABLE CHEST - 1 VIEW  COMPARISON:  09/10/2013  FINDINGS: Heart size and vascular pattern normal. Stable cardiac pacer. Aortic arch calcification stable. Left lung shows mild stable lower lobe scarring or atelectasis. In the right lower lobe laterally, there is patchy airspace disease. This is slightly more prominent when compared to 09/10/13  IMPRESSION: Persistent stable airspace disease at left base most consistent with atelectasis although infiltrate is not excluded. On the right, there is increased airspace infiltrate. Findings suggest pneumonitis.   Electronically Signed   By: Skipper Cliche M.D.   On: 09/11/2013 19:07    Scheduled Meds: . aspirin EC  81 mg Oral Daily  . benzonatate  100 mg Oral TID  . budesonide (PULMICORT) nebulizer solution  0.25 mg Nebulization BID  . cefTRIAXone (ROCEPHIN)  IV  1 g Intravenous Q24H  . fenofibrate  160 mg Oral Daily  . insulin aspart  0-9 Units Subcutaneous TID WC  . insulin aspart  3 Units Subcutaneous TID WC  . insulin glargine  23 Units Subcutaneous QHS  . ipratropium-albuterol  3 mL Nebulization BID  . nitroGLYCERIN  0.2 mg Transdermal Daily  . oseltamivir  30 mg Oral BID  . pantoprazole  40 mg Oral Daily  . [START ON 09/14/2013] predniSONE  40 mg Oral Q breakfast  . simvastatin  20 mg Oral q1800  . sodium chloride  3 mL Intravenous Q12H  . sodium chloride  3 mL Intravenous Q12H    Time spent: >30 min    Leotha Voeltz  [facility] Pager [phone]. If 7PM-7AM, please contact night-coverage at [email or web address], password [facility] 09/13/2013, 5:52 PM  LOS: 3 days

## 2013-09-13 NOTE — Evaluation (Signed)
Physical Therapy Evaluation [name] MRN: [id number] DOB: [birth year] Today's Date: 09/13/2013 Time: 2536-6440 PT Time Calculation (min): 34 min  PT Assessment / Plan / Recommendation History of Present Illness  Antonio Ray is a 80 y.o. male history of CAD status post stenting, chronic systolic heart failure status post AICD placement, chronic kidney disease baseline creatinine around 1.5, Diabetes mellitus type 2 was brought to the ER after patient was found to be confused. As per patient's son patient was not doing well on Friday last 5 days ago and was taken to the ER. Over that patient was found to be febrile but since patient's chest x-ray did not show anything acute and influenza PCR was negative was discharged home. Patient had followed up with his PCP yesterday. Since yesterday patient has been having some nonproductive cough which became more productive from today. Later in the evening patient became confused and EMS was called and patient was found to be febrile. Patient was brought to the ER and patient was found to be febrile and chest x-ray shows possible airspace infiltrates versus atelectasis. Given patient's symptoms at this time patient has been empirically started on antibiotics for pneumonia. Flu (+), sepsis, encephalopathy  Clinical Impression  Pt pleasantly confused throughout session. 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 Son denied need for HHPT and stated he could provide short term 24hr supervision at DC. Pt with decreased safety, balance, mobility and decreased respiratory status all impairing function who will benefit from acute therapy to maximize mobility and safety to decrease burden of care. On arrival pt on RA with sats 86% on RA with return to 96% on 2L with gait sats dropped to 87% and required cues  for breathing technique. Pt encouraged to ambulate daily. Discussed with son home setup and safety concerns for being alone including cognition and fall hazards.     PT Assessment  Patient needs continued PT services    Follow Up Recommendations  Supervision/Assistance - 24 hour;No PT follow up    Does the patient have the potential to tolerate intense rehabilitation      Barriers to Discharge Decreased caregiver support      Equipment Recommendations  None recommended by PT    Recommendations for Other Services     Frequency Min 3X/week    Precautions / Restrictions Precautions Precautions: Fall   Pertinent Vitals/Pain No pain      Mobility  Bed Mobility General bed mobility comments: pt EOB on arrival Transfers Overall transfer level: Needs assistance Transfers: Sit to/from Stand Sit to Stand: Supervision General transfer comment: decreased awareness with cues for safety. Pt cued to sit in chair so bed could be moved and increased time and difficulty processing command to sit Ambulation/Gait Ambulation/Gait assistance: Min guard, pt reaching for environmental supports several times throughout gait Ambulation Distance (Feet): 80 Feet Assistive device: None Gait Pattern/deviations: Step-through pattern;Decreased stride length Gait velocity interpretation: <1.8 ft/sec, indicative of risk for recurrent falls General Gait Details: pt with slow gait, distracted at times with cues for directions to room as well as breathing technique. Pt in hallway nearly running into meal cart with no effort made to further avoid it and stated awareness of its presence    Exercises     PT Diagnosis: Difficulty walking;Altered mental status  PT Problem List: Decreased cognition;Decreased activity tolerance;Decreased  balance;Decreased safety awareness PT Treatment Interventions: Gait training;Stair training;Functional mobility training;Therapeutic activities;Patient/family education;Cognitive  remediation     PT Goals(Current goals can be found in the care plan section) Acute Rehab PT Goals Patient Stated Goal: be able to return to work PT Goal Formulation: With patient/family Time For Goal Achievement: 09/20/13 Potential to Achieve Goals: Good  Visit Information  Last PT Received On: 09/13/13 Assistance Needed: +1 History of Present Illness: Antonio Ray is a 80 y.o. male history of CAD status post stenting, chronic systolic heart failure status post AICD placement, chronic kidney disease baseline creatinine around 1.5, Diabetes mellitus type 2 was brought to the ER after patient was found to be confused. As per patient's son patient was not doing well on Friday last 5 days ago and was taken to the ER. Over that patient was found to be febrile but since patient's chest x-ray did not show anything acute and influenza PCR was negative was discharged home. Patient had followed up with his PCP yesterday. Since yesterday patient has been having some nonproductive cough which became more productive from today. Later in the evening patient became confused and EMS was called and patient was found to be febrile. Patient was brought to the ER and patient was found to be febrile and chest x-ray shows possible airspace infiltrates versus atelectasis. Given patient's symptoms at this time patient has been empirically started on antibiotics for pneumonia. 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 Pt with decreased problem solving and per son would never call "911" in an emergency but calls children    Extremity/Trunk Assessment Upper Extremity Assessment Upper Extremity Assessment: Overall WFL for tasks assessed Lower Extremity Assessment Lower Extremity Assessment: Generalized weakness Cervical / Trunk Assessment Cervical / Trunk Assessment: Normal   Balance    End of Session PT - End of Session Equipment Utilized During Treatment: Gait belt;Oxygen Activity Tolerance: Patient tolerated treatment well Patient left: in chair;with call bell/phone within reach;with family/visitor present Nurse Communication: Mobility status  GP     Melford Aase 09/13/2013, 2:39 PM Elwyn Reach, [place]

## 2013-09-13 NOTE — Progress Notes (Signed)
Pt is alert and oriented to self only now, when earlier this AM pt was alert and oriented x4.  MD notified, will continue to monitor.

## 2013-09-14 LAB — BASIC METABOLIC PANEL
BUN: 38 mg/dL — ABNORMAL HIGH (ref 6–23)
CO2: 33 mEq/L — ABNORMAL HIGH (ref 19–32)
Calcium: 9.3 mg/dL (ref 8.4–10.5)
Chloride: 105 mEq/L (ref 96–112)
Creatinine, Ser: 1.26 mg/dL (ref 0.50–1.35)
GFR, EST AFRICAN AMERICAN: 61 mL/min — AB (ref >90)
GFR, EST NON AFRICAN AMERICAN: 53 mL/min — AB (ref >90)
GLUCOSE: 185 mg/dL — AB (ref 70–99)
POTASSIUM: 4.4 meq/L (ref 3.7–5.3)
SODIUM: 147 meq/L (ref 137–147)

## 2013-09-14 LAB — GLUCOSE, CAPILLARY
GLUCOSE-CAPILLARY: 177 mg/dL — AB (ref 70–99)
Glucose-Capillary: 143 mg/dL — ABNORMAL HIGH (ref 70–99)
Glucose-Capillary: 98 mg/dL (ref 70–99)
Glucose-Capillary: 92 mg/dL (ref 70–99)

## 2013-09-14 LAB — CBC
HCT: 37.4 % — ABNORMAL LOW (ref 39.0–52.0)
HEMOGLOBIN: 11.9 g/dL — AB (ref 13.0–17.0)
MCH: 29.2 pg (ref 26.0–34.0)
MCHC: 31.8 g/dL (ref 30.0–36.0)
MCV: 91.9 fL (ref 78.0–100.0)
Platelets: 287 10*3/uL (ref 150–400)
RBC: 4.07 MIL/uL — ABNORMAL LOW (ref 4.22–5.81)
RDW: 14.2 % (ref 11.5–15.5)
WBC: 9.4 10*3/uL (ref 4.0–10.5)

## 2013-09-14 NOTE — Progress Notes (Signed)
[facility] PROGRESS NOTE  Antonio Ray KYH:[id number] DOB: 1934/07/01 DOA: 09/10/2013 PCP: Antonio Rasmussen., MD  Assessment/Plan  1-Acute encephalopathy  - most likely secondary to dehydration, infection, steroids and sundowning/hospital delirium -Improving clinically, but intermittently with trouble following commands or understanding what he needs to do.  2-Influenza A. with pneumonia, COPD exacerbation and sepsis syndrome -Continue Tamiflu and empiric antibiotics with Rocephin and Zithromax IV X 1 day; then will switch to PO and if stable discharge home in am -Blood and urine cultures with no growth to date, repeat chest x-ray on 2/11 with increased airspace infiltrate on right -patient remains afebrile and with improvement on aeration during physical exam and decrease wheezing. -  S. pneumo neg -  Legionella neg -will continue tapering steroids, continue nebulizer treatment, continue pulmicort -will continue PRN antitussives  -continue and encourage use of IS/flutter valve  3-Acute hypoxic respiratory failure -Secondary to above, see treatment as discussed above - Continue supplemental oxygen, follow and Wean to RA as tolerated  4-Acute on chronic renal failure due to dehydration from confusion and poor orla intake, baseline creatinine 1.5 -back to baseline -will continue lasix -follow BMET  5-Chronic systolic heart failure with last EF measured was 30-35% status post AICD placement -  Continue ASA -  Not on beta blocker due to low BP(BP soft is stable today follow) -Not on ACEI due to AKI and low BP -will continue lasix and low sodium diet  6-CAD status post stenting, chest pain free.  Continue ASA and statin -  Not on beta blocker due to low BP -  NTG patch - placed hold parameters for blood pressure -  Tele:  NSR with non sustained PVC; will follow electrolytes and replete them as needed  7-Diabetes mellitus type 2, poorly controlled -  Hemoglobin A1c 10.7 -   Continue increase Lantus, follow and further adjust as clinically appropriate -  Continue SSI low dose  Diet:  Diabetic Proph:  heparin  Code Status: full Family Communication: no family at bedside Disposition Plan: To home when medically ready   Consultants:  None  Procedures:  Chest x-ray  Antibiotics:  Ceftriaxone from 2/10   Azithromycin from 2/10  HPI/Subjective: Afebrile, feeling and breathing better. Patient continue to exhibit intermittent episodes of confusion; per discussion with daughter is not unusual for him, especially during hospital states.  Objective: Filed Vitals:   09/13/13 2004 09/14/13 0453 09/14/13 0947 09/14/13 1300  BP: 149/72 125/48  137/68  Pulse: 83 57  76  Temp: 97.3 F (36.3 C) 97.2 F (36.2 C)  97.5 F (36.4 C)  TempSrc: Oral Axillary  Oral  Resp: 18 18  18   Height:      Weight:  80.105 kg (176 lb 9.6 oz)    SpO2: 96% 92% 93% 90%    Intake/Output Summary (Last 24 hours) at 09/14/13 1334 Last data filed at 09/14/13 0600  Gross per 24 hour  Intake    540 ml  Output    875 ml  Net   -335 ml   Filed Weights   09/12/13 0717 09/13/13 0616 09/14/13 0453  Weight: 84.188 kg (185 lb 9.6 oz) 84.324 kg (185 lb 14.4 oz) 80.105 kg (176 lb 9.6 oz)    Exam:   General:  afebrile, in NAD  HEENT:  NCAT, MMM; no JVD  Cardiovascular:  Distant heart sounds, RRR, nl S1, S2 no obvious mrg, 2+ pulses, warm extremities  Respiratory:  Improved air movement; scattered rhonchi and mild wheezing  Abdomen:   +BS, soft, NT/ND  EXTREMITIES: No cyanosis no edema  Neuro:  Grossly intact  Data Reviewed: Basic Metabolic Panel:  Recent Labs Lab 09/10/13 1833 09/11/13 0142 09/11/13 0416 09/12/13 0646 09/13/13 0447 09/14/13 0515  NA 144  --  145 147 145 147  K 3.8  --  4.7 5.2 4.8 4.4  CL 103  --  107 107 105 105  CO2 26  --  25 28 32 33*  GLUCOSE 272*  --  268* 253* 284* 185*  BUN 43*  --  35* 33* 37* 38*  CREATININE 2.15* 1.82* 1.83*  1.43* 1.27 1.26  CALCIUM 9.2  --  8.8 8.9 9.2 9.3   Liver Function Tests:  Recent Labs Lab 09/10/13 1833 09/11/13 0416  AST 42* 51*  ALT 51 56*  ALKPHOS 79 97  BILITOT 0.4 0.4  PROT 7.2 6.8  ALBUMIN 2.7* 2.2*   CBC:  Recent Labs Lab 09/10/13 1833 09/11/13 0142 09/11/13 0416 09/12/13 0646 09/13/13 0447 09/14/13 0515  WBC 13.3* 13.5* 14.3* 12.9* 12.7* 9.4  NEUTROABS 11.3*  --  12.1*  --   --   --   HGB 13.2 12.6* 12.5* 12.6* 11.5* 11.9*  HCT 39.4 38.6* 38.7* 40.2 36.9* 37.4*  MCV 89.5 91.7 92.4 95.0 93.7 91.9  PLT 260 256 277 259 271 287   CBG:  Recent Labs Lab 09/13/13 1055 09/13/13 1703 09/13/13 2113 09/14/13 0620 09/14/13 1130  GLUCAP 186* 296* 218* 143* 98    Recent Results (from the past 240 hour(s))  CULTURE, BLOOD (ROUTINE X 2)     Status: None   Collection Time    09/10/13  6:45 PM      Result Value Ref Range Status   Specimen Description BLOOD ARM RIGHT   Final   Special Requests BOTTLES DRAWN AEROBIC AND ANAEROBIC 10CC   Final   Culture  Setup Time     Final   Value: 09/11/2013 03:32     Performed at [facility]   Culture     Final   Value:        BLOOD CULTURE RECEIVED NO GROWTH TO DATE CULTURE WILL BE HELD FOR 5 DAYS BEFORE ISSUING A FINAL NEGATIVE REPORT     Performed at [facility]   Report Status PENDING   Incomplete  CULTURE, BLOOD (ROUTINE X 2)     Status: None   Collection Time    09/10/13  6:50 PM      Result Value Ref Range Status   Specimen Description BLOOD HAND RIGHT   Final   Special Requests BOTTLES DRAWN AEROBIC ONLY 10CC   Final   Culture  Setup Time     Final   Value: 09/11/2013 03:31     Performed at [facility]   Culture     Final   Value:        BLOOD CULTURE RECEIVED NO GROWTH TO DATE CULTURE WILL BE HELD FOR 5 DAYS BEFORE ISSUING A FINAL NEGATIVE REPORT     Performed at [facility]   Report Status PENDING   Incomplete  URINE CULTURE     Status: None   Collection Time     09/10/13  8:27 PM      Result Value Ref Range Status   Specimen Description URINE, CLEAN CATCH   Final   Special Requests NONE   Final   Culture  Setup Time     Final   Value: 09/10/2013 22:00  Performed at [facility]     Final   Value: NO GROWTH     Performed at [facility]   Culture     Final   Value: NO GROWTH     Performed at [facility]   Report Status 09/11/2013 FINAL   Final     Studies: No results found.  Scheduled Meds: . aspirin EC  81 mg Oral Daily  . benzonatate  100 mg Oral TID  . budesonide (PULMICORT) nebulizer solution  0.25 mg Nebulization BID  . cefTRIAXone (ROCEPHIN)  IV  1 g Intravenous Q24H  . fenofibrate  160 mg Oral Daily  . furosemide  20 mg Oral Daily  . insulin aspart  0-9 Units Subcutaneous TID WC  . insulin aspart  3 Units Subcutaneous TID WC  . insulin glargine  23 Units Subcutaneous QHS  . ipratropium-albuterol  3 mL Nebulization BID  . nitroGLYCERIN  0.2 mg Transdermal Daily  . oseltamivir  30 mg Oral BID  . pantoprazole  40 mg Oral Daily  . predniSONE  40 mg Oral Q breakfast  . simvastatin  20 mg Oral q1800  . sodium chloride  3 mL Intravenous Q12H  . sodium chloride  3 mL Intravenous Q12H    Time spent: >30 min    Kimi Kroft  [facility] Pager [phone]. If 7PM-7AM, please contact night-coverage at [email or web address], password [facility] 09/14/2013, 1:34 PM  LOS: 4 days

## 2013-09-14 NOTE — Progress Notes (Signed)
Client is up on side of bed.  Moving all four extremities.  He is extremely irritable and had a very difficult time getting him to take his medication.  He is not orientated to his surroundings, although, he recognizes to some degree his daughter.  Left eye is partially closed, and is his"normal" per daughter.  Swallowing without difficulty.

## 2013-09-15 DIAGNOSIS — K219 Gastro-esophageal reflux disease without esophagitis: Secondary | ICD-10-CM

## 2013-09-15 DIAGNOSIS — E785 Hyperlipidemia, unspecified: Secondary | ICD-10-CM

## 2013-09-15 LAB — CBC
HCT: 40.9 % (ref 39.0–52.0)
HEMOGLOBIN: 13.3 g/dL (ref 13.0–17.0)
MCH: 29.9 pg (ref 26.0–34.0)
MCHC: 32.5 g/dL (ref 30.0–36.0)
MCV: 91.9 fL (ref 78.0–100.0)
Platelets: 349 10*3/uL (ref 150–400)
RBC: 4.45 MIL/uL (ref 4.22–5.81)
RDW: 14.1 % (ref 11.5–15.5)
WBC: 10.7 10*3/uL — AB (ref 4.0–10.5)

## 2013-09-15 LAB — BASIC METABOLIC PANEL
BUN: 35 mg/dL — ABNORMAL HIGH (ref 6–23)
CO2: 31 mEq/L (ref 19–32)
Calcium: 9.3 mg/dL (ref 8.4–10.5)
Chloride: 101 mEq/L (ref 96–112)
Creatinine, Ser: 1.29 mg/dL (ref 0.50–1.35)
GFR calc non Af Amer: 51 mL/min — ABNORMAL LOW (ref >90)
GFR, EST AFRICAN AMERICAN: 60 mL/min — AB (ref >90)
Glucose, Bld: 131 mg/dL — ABNORMAL HIGH (ref 70–99)
POTASSIUM: 4.1 meq/L (ref 3.7–5.3)
Sodium: 145 mEq/L (ref 137–147)

## 2013-09-15 LAB — GLUCOSE, CAPILLARY
GLUCOSE-CAPILLARY: 137 mg/dL — AB (ref 70–99)
Glucose-Capillary: 186 mg/dL — ABNORMAL HIGH (ref 70–99)

## 2013-09-15 MED ORDER — PREDNISONE 20 MG PO TABS: ORAL_TABLET | ORAL | Status: DC

## 2013-09-15 MED ORDER — BUDESONIDE-FORMOTEROL FUMARATE 160-4.5 MCG/ACT IN AERO: 2.0000 | INHALATION_SPRAY | Freq: Two times a day (BID) | RESPIRATORY_TRACT | Status: DC

## 2013-09-15 MED ORDER — AMOXICILLIN-POT CLAVULANATE 500-125 MG PO TABS: 1.0000 | ORAL_TABLET | Freq: Three times a day (TID) | ORAL | Status: AC

## 2013-09-15 NOTE — Discharge Summary (Signed)
Physician Discharge Summary  Antonio Ray [id number] DOB: [birth year] DOA: 09/10/2013  PCP: Hayden Rasmussen., MD  Admit date: 09/10/2013 Discharge date: 09/15/2013  Time spent: >30 minutes  Recommendations for Outpatient Follow-up:  1. BMET to follow electrolytes and renal function 2. Reassess BP and is appropriate start low dose bisoprolol  3. Reassess needs for oxygen supplementation 4. Patient will benefit of pulmonologist referral; make sure he has contacted [facility] to set up appointment. 5. Reassess and closely follow CBG's and diabetes; A1C 10.7  Discharge Diagnoses:  Principal Problem:   Acute encephalopathy Active Problems:   Coronary artery disease   Chronic systolic congestive heart failure   Diabetes mellitus type II, non insulin dependent   Pneumonia, community acquired   Renal failure (ARF), acute on chronic   Influenza with pneumonia   Leukocytosis, unspecified   Acute respiratory failure with hypoxia   Discharge Condition: stable and improved. Will discharge home with [facility] services and 24/7 supervision and assistance from family.  Diet recommendation: low carbohydrates and low sodium diet  Filed Weights   09/13/13 0616 09/14/13 0453 09/15/13 0423  Weight: 84.324 kg (185 lb 14.4 oz) 80.105 kg (176 lb 9.6 oz) 79.6 kg (175 lb 7.8 oz)    History of present illness:  80 y.o. male history of CAD status post stenting, chronic systolic heart failure status post AICD placement, chronic kidney disease baseline creatinine around 1.5, Diabetes mellitus type 2 was brought to the ER after patient was found to be confused. As per patient's son patient was not doing well on Friday last 5 days ago and was taken to the ER. Over that patient was found to be febrile but since patient's chest x-ray did not show anything acute and influenza PCR was negative was discharged home. Patient had followed up with his PCP yesterday. Since yesterday patient has been having  some nonproductive cough which became more productive from today. Later in the evening patient became confused and EMS was called and patient was found to be febrile. Patient was brought to the ER and patient was found to be febrile and chest x-ray shows possible airspace infiltrates versus atelectasis. Given patient's symptoms at this time patient has been empirically started on antibiotics for pneumonia. Influenza PCR has been reordered. CT head did not show anything acute. Labs done in the ER shows worsening of the creatinine and leukocytosis. Patient was given 1 L normal saline bolus. As per patient's son patient's mental status has improved since he has ER. Patient is nonfocal otherwise. Patient's son states he also was sick since last week with flulike symptoms.   Hospital Course:  1-Acute encephalopathy  - most likely secondary to dehydration, infection, steroids and sundowning/hospital delirium.  -Improving clinically, but intermittently with trouble following commands or understanding what he needs to do.  -after discussing with family, plan is to discharge home with 24/7 assistance and supervision -patient will finish antibiotic therapy for infection' has already complete tx with tamiflu for influenza. -is in the process of tapering steroids off and will be discharge on oxygen supplementation  2-Influenza A. with pneumonia, COPD exacerbation and sepsis syndrome  -Tamiflu therapy completed; will continue tapering steroids, continue PRN albuterol and will start symbicort BID. -oxygen supplementation has been arranged and patient will finish 4 more days of augmentin to complete antibiotic therapy -Blood and urine cultures with no growth to date  -patient remains afebrile and with improvement on aeration during physical exam and pretty much no wheezing.  - S.  pneumo neg  - Legionella neg  -will continue PRN antitussives  -continue and encourage use of IS/flutter valve  -patient will benefit  of follow up with pulmonologist as an outpatient after acute episode is resolved to have PFT's performed and adjust maintenance medications.  3-Acute hypoxic respiratory failure  -Secondary to above, see treatment as discussed above  - Continue supplemental oxygen, follow and Wean to RA slowly as tolerated   4-Acute on chronic renal failure due to dehydration from confusion and poor orla intake, baseline creatinine 1.5  -back to baseline  -will resume home medications -BMET to be follow a s outpatient   5-Chronic systolic heart failure with last EF measured was 30-35% status post AICD placement  - Continue ASA  -continue ARB -if BP stable will recommend starting low dose bisoprolol during follow up for his CHF as well. -will continue lasix and low sodium diet   6-CAD status post stenting, chest pain free. Continue ASA and statin  - Not on beta blocker due to COPD according to family; during this admission BP was also initially soft, preventing use of any extra antihypertensive. - will continue nitrates, lasix and losartan  7-Diabetes mellitus type 2, poorly controlled  - Hemoglobin A1c 10.7  - Continue Lantus, follow low carb diet and after finishing steroids PCP to further adjust his hypoglycemic regimen as needed  8-physical deconditioning: will arrange for [facility] services for PT at discharge.  *Rest of medical problems remains stable and the plan is to continue current medication regimen.  Procedures:  See below for x-ray reports   Consultations:  None   Discharge Exam: Filed Vitals:   09/15/13 0423  BP: 161/72  Pulse: 97  Temp: 98.4 F (36.9 C)  Resp: 19   General: afebrile, in NAD; still with episodes of intermittent disorientation HEENT: NCAT, MMM; no JVD  Cardiovascular: Distant heart sounds, RRR, nl S1, S2 no obvious mrg, 2+ pulses, warm extremities Respiratory: Improved air movement; scattered rhonchi and just mild wheezing  Abdomen: +BS, soft, NT/ND   EXTREMITIES: No cyanosis no edema  Neuro: Grossly intact   Discharge Instructions  Discharge Orders   Future Appointments Provider Department Dept Phone   11/22/2013 8:10 AM [facility] [place] [facility] [phone]   Future Orders Complete By Expires   Diet - low sodium heart healthy  As directed    Discharge instructions  As directed    Comments:     Take medications as prescribed Use oxygen 24/7 2L until follow with PCP Follow a low sodium diet Arrange follow up with pulmonologist in approx 2 weeks from discharge       Medication List    STOP taking these medications       Fluticasone-Salmeterol 250-50 MCG/DOSE Aepb  Commonly known as:  ADVAIR DISKUS      TAKE these medications       amoxicillin-clavulanate 500-125 MG per tablet  Commonly known as:  AUGMENTIN  Take 1 tablet (500 mg total) by mouth 3 (three) times daily.     aspirin 81 MG EC tablet  Take 81 mg by mouth daily.     benzonatate 100 MG capsule  Commonly known as:  TESSALON  Take 100 mg by mouth 3 (three) times daily as needed for cough.     budesonide-formoterol 160-4.5 MCG/ACT inhaler  Commonly known as:  SYMBICORT  Inhale 2 puffs into the lungs 2 (two) times daily.     fenofibrate 145 MG tablet  Commonly known as:  TRICOR  TAKE 1 TABLET BY MOUTH DAILY     furosemide 20 MG tablet  Commonly known as:  LASIX  Take 1 tablet (20 mg total) by mouth daily.     insulin glargine 100 UNIT/ML injection  Commonly known as:  LANTUS  Inject 20 Units into the skin at bedtime.     losartan 50 MG tablet  Commonly known as:  COZAAR  Take 1 tablet (50 mg total) by mouth daily. PATIENT NEEDS OFFICE VISIT FOR ADDITIONAL REFILLS     nitroGLYCERIN 0.2 mg/hr patch  Commonly known as:  NITRODUR - Dosed in mg/24 hr  Place 1 patch (0.2 mg total) onto the skin daily.     omeprazole 20 MG capsule  Commonly known as:  PRILOSEC  TAKE ONE CAPSULE BY MOUTH EVERY DAY     pravastatin 40  MG tablet  Commonly known as:  PRAVACHOL  Take 1 tablet (40 mg total) by mouth every evening.     predniSONE 20 MG tablet  Commonly known as:  DELTASONE  Take 1 tablet by mouth daily X 3 days; then 1/2 tablet by mouth daily X 3 days and stop prednisone     PROVENTIL HFA 108 (90 BASE) MCG/ACT inhaler  Generic drug:  albuterol  As directed     VITAMIN D PO  Take 2,000 Units by mouth daily. Ecotrin       Allergies  Allergen Reactions  . Carvedilol Other (See Comments)    Made dizzy  . Codeine Other (See Comments)    Gets very angry, disoriented  . Dilaudid [Hydromorphone Hcl]     Out of it   . Flomax [Tamsulosin Hcl]     Shortness of breath  . Levofloxacin [Levofloxacin] Other (See Comments)    unknown  . Morphine And Related Other (See Comments)    Gets very angry, disoriented  . Sulfa Antibiotics     Shortness of breath       Follow-up Information   Follow up with [facility]. (home health physical therapy and nurse)    Contact information:   [address] [place] [address] [phone]       Follow up with Hayden Rasmussen., MD. (as previously scheduled )    Specialty:  Family Medicine   Contact information:   [address] [place] [address] [phone]       Follow up with [facility]. Call in 2 weeks. (call office in 2 weeks to set up appointment)    Specialty:  Pulmonology   Contact information:   [place]  [id number] [phone]       The results of significant diagnostics from this hospitalization (including imaging, microbiology, ancillary and laboratory) are listed below for reference.    Significant Diagnostic Studies: Dg Chest Port 1 View  09/11/2013   CLINICAL DATA:  New onset hemoptysis  EXAM: PORTABLE CHEST - 1 VIEW  COMPARISON:  09/10/2013  FINDINGS: Heart size and vascular pattern normal. Stable cardiac pacer. Aortic arch calcification stable. Left  lung shows mild stable lower lobe scarring or atelectasis. In the right lower lobe laterally, there is patchy airspace disease. This is slightly more prominent when compared to 09/10/13  IMPRESSION: Persistent stable airspace disease at left base most consistent with atelectasis although infiltrate is not excluded. On the right, there is increased airspace infiltrate. Findings suggest pneumonitis.   Electronically Signed   By: Skipper Cliche M.D.   On: 09/11/2013  19:07   Dg Chest Port 1 View  09/10/2013   CLINICAL DATA:  Fever.  Altered mental status.  EXAM: PORTABLE CHEST - 1 VIEW  COMPARISON:  PA and lateral chest 05/19/2012, 07/18/2011 and 03/30/2011.  FINDINGS: Mild, patchy airspace disease is seen in the lung bases. The lungs are otherwise clear. Heart size is normal. No pneumothorax or pleural effusion. AICD is noted.  IMPRESSION: Mild, patchy basilar airspace disease has an appearance most compatible with atelectasis rather than pneumonia.   Electronically Signed   By: Inge Rise M.D.   On: 09/10/2013 19:02    Microbiology: Recent Results (from the past 240 hour(s))  CULTURE, BLOOD (ROUTINE X 2)     Status: None   Collection Time    09/10/13  6:45 PM      Result Value Ref Range Status   Specimen Description BLOOD ARM RIGHT   Final   Special Requests BOTTLES DRAWN AEROBIC AND ANAEROBIC 10CC   Final   Culture  Setup Time     Final   Value: 09/11/2013 03:32     Performed at [facility]   Culture     Final   Value:        BLOOD CULTURE RECEIVED NO GROWTH TO DATE CULTURE WILL BE HELD FOR 5 DAYS BEFORE ISSUING A FINAL NEGATIVE REPORT     Performed at [facility]   Report Status PENDING   Incomplete  CULTURE, BLOOD (ROUTINE X 2)     Status: None   Collection Time    09/10/13  6:50 PM      Result Value Ref Range Status   Specimen Description BLOOD HAND RIGHT   Final   Special Requests BOTTLES DRAWN AEROBIC ONLY 10CC   Final   Culture  Setup Time     Final   Value:  09/11/2013 03:31     Performed at [facility]   Culture     Final   Value:        BLOOD CULTURE RECEIVED NO GROWTH TO DATE CULTURE WILL BE HELD FOR 5 DAYS BEFORE ISSUING A FINAL NEGATIVE REPORT     Performed at [facility]   Report Status PENDING   Incomplete  URINE CULTURE     Status: None   Collection Time    09/10/13  8:27 PM      Result Value Ref Range Status   Specimen Description URINE, CLEAN CATCH   Final   Special Requests NONE   Final   Culture  Setup Time     Final   Value: 09/10/2013 22:00     Performed at [facility]     Final   Value: NO GROWTH     Performed at [facility]   Culture     Final   Value: NO GROWTH     Performed at [facility]   Report Status 09/11/2013 FINAL   Final    Labs: Basic Metabolic Panel:  Recent Labs Lab 09/11/13 0416 09/12/13 0646 09/13/13 0447 09/14/13 0515 09/15/13 0345  NA 145 147 145 147 145  K 4.7 5.2 4.8 4.4 4.1  CL 107 107 105 105 101  CO2 25 28 32 33* 31  GLUCOSE 268* 253* 284* 185* 131*  BUN 35* 33* 37* 38* 35*  CREATININE 1.83* 1.43* 1.27 1.26 1.29  CALCIUM 8.8 8.9 9.2 9.3 9.3   Liver Function Tests:  Recent Labs Lab 09/10/13 1833 09/11/13 0416  AST 42* 51*  ALT 51 56*  ALKPHOS 79 97  BILITOT 0.4 0.4  PROT 7.2 6.8  ALBUMIN 2.7* 2.2*   CBC:  Recent Labs Lab 09/10/13 1833  09/11/13 0416 09/12/13 0646 09/13/13 0447 09/14/13 0515 09/15/13 0345  WBC 13.3*  < > 14.3* 12.9* 12.7* 9.4 10.7*  NEUTROABS 11.3*  --  12.1*  --   --   --   --   HGB 13.2  < > 12.5* 12.6* 11.5* 11.9* 13.3  HCT 39.4  < > 38.7* 40.2 36.9* 37.4* 40.9  MCV 89.5  < > 92.4 95.0 93.7 91.9 91.9  PLT 260  < > 277 259 271 287 349  < > = values in this interval not displayed.  CBG:  Recent Labs Lab 09/14/13 1130 09/14/13 1720 09/14/13 2136 09/15/13 0611 09/15/13 1133  GLUCAP 98 177* 92 137* 186*     Signed:  Tzivia Oneil  [facility] 09/15/2013, 12:22  PM

## 2013-09-15 NOTE — Progress Notes (Signed)
Son and daughter verbalized understanding of discharge instructions.  AVS reviewed with family.  IV discontinued.  Client more alert and cooperative upon discharge.

## 2013-09-15 NOTE — Progress Notes (Signed)
SATURATION QUALIFICATIONS: (This note is used to comply with regulatory documentation for home oxygen)  Patient Saturations on Room Air at Rest = 89  Patient Saturations on Room Air while Ambulating = 83  Patient Saturations on 2 Liters of oxygen while Ambulating = 94  Please briefly explain why patient needs home oxygen: Client desats without use of oxygen.

## 2013-09-15 NOTE — Progress Notes (Signed)
   CARE MANAGEMENT NOTE 09/15/2013  Patient:  EDGARD, DEBORD   Account Number:  [id number]  Date Initiated:  09/15/2013  Documentation initiated by:  [facility]  Subjective/Objective Assessment:   adm: Confusion/Acute encephalopathy /Influenza A. with pneumonia, COPD exacerbation and sepsis syndrome     Action/Plan:   discharge planning   Anticipated DC Date:  09/15/2013   Anticipated DC Plan:  [place]  CM consult      [facility] Choice  HOME HEALTH   Choice offered to / List presented to:  C-4 Adult Children   DME arranged  [facility]      DME agency  [place] arranged  HH-1 RN  [place].   Status of service:  Completed, signed off Medicare Important Message given?   (If response is "NO", the following Medicare IM given date fields will be blank) Date Medicare IM given:   Date Additional Medicare IM given:    Discharge Disposition:  [place]  Per UR Regulation:    If discussed at Long Length of Stay Meetings, dates discussed:    Comments:  09/15/13 11:45 CM spoke with pt ad pt's son, [name] [phone], to offer choice for [facility] services.  [name] requested [facility] to call hime to arrange [facility].  Address verified.  Oxygen to be delivered to room prior to discharge.  Referral faxed to [facility] for HHRN/PT.   No other CM needs were communicated.  Mariane Masters, BSN, CM [phone].

## 2013-09-16 NOTE — Progress Notes (Signed)
UR completed Maxwell Martorano K. Chessie Neuharth, RN, BSN, MSHL, CCM  09/16/2013 8:10 AM

## 2013-09-17 LAB — CULTURE, BLOOD (ROUTINE X 2)
CULTURE: NO GROWTH
Culture: NO GROWTH

## 2013-09-25 ENCOUNTER — Encounter: Payer: Self-pay | Admitting: Gastroenterology

## 2013-10-07 ENCOUNTER — Encounter (INDEPENDENT_AMBULATORY_CARE_PROVIDER_SITE_OTHER): Payer: Self-pay

## 2013-10-07 ENCOUNTER — Ambulatory Visit (INDEPENDENT_AMBULATORY_CARE_PROVIDER_SITE_OTHER)
Admission: RE | Admit: 2013-10-07 | Discharge: 2013-10-07 | Disposition: A | Discharge status: Home / Self Care | Payer: Federal, State, Local not specified - PPO | Source: Ambulatory Visit | Attending: Pulmonary Disease | Admitting: Pulmonary Disease

## 2013-10-07 ENCOUNTER — Encounter: Payer: Self-pay | Admitting: Pulmonary Disease

## 2013-10-07 ENCOUNTER — Ambulatory Visit (INDEPENDENT_AMBULATORY_CARE_PROVIDER_SITE_OTHER): Payer: Federal, State, Local not specified - PPO | Admitting: Pulmonary Disease

## 2013-10-07 ENCOUNTER — Telehealth: Payer: Self-pay | Admitting: Pulmonary Disease

## 2013-10-07 VITALS — BP 124/58 | HR 71 | Ht 70.0 in | Wt 183.0 lb

## 2013-10-07 DIAGNOSIS — J449 Chronic obstructive pulmonary disease, unspecified: Secondary | ICD-10-CM

## 2013-10-07 DIAGNOSIS — J4489 Other specified chronic obstructive pulmonary disease: Secondary | ICD-10-CM

## 2013-10-07 DIAGNOSIS — J189 Pneumonia, unspecified organism: Secondary | ICD-10-CM

## 2013-10-07 DIAGNOSIS — I2510 Atherosclerotic heart disease of native coronary artery without angina pectoris: Secondary | ICD-10-CM

## 2013-10-07 NOTE — Telephone Encounter (Signed)
It is fine to have advanced home care pick up the oxygen

## 2013-10-07 NOTE — Telephone Encounter (Signed)
Spoke with pt's son - He states that pt has not had to use oxygen in past 3 weeks - Sats are staying around 92% even with exertion - Please advsie if ok to d/c oxygen in the home

## 2013-10-07 NOTE — Patient Instructions (Signed)
Continue taking symbicort 2 puffs twice a day no matter how you feel We will call you with the results of your chest x-ray We will see you back in 3 months or sooner if needed

## 2013-10-07 NOTE — Assessment & Plan Note (Signed)
COPD: GOLD Stage IV by spirometry, GRADE C based on minimal symptoms Combined recommendations from the [place], [employer]icians, Investment banker, corporate, [place] (Qaseem A et al, Ann Intern Med. 2011;155(3):179) recommends tobacco cessation, pulmonary rehab (for symptomatic patients with an FEV1 < 50% predicted), supplemental oxygen (for patients with SaO2 <88% or paO2 <55), and appropriate bronchodilator therapy.  In regards to long acting bronchodilators, they recommend monotherapy (FEV1 60-80% with symptoms weak evidence, FEV1 with symptoms <60% strong evidence), or combination therapy (FEV1 <60% with symptoms, strong recommendation, moderate evidence).  One should also provide patients with annual immunizations and consider therapy for prevention of COPD exacerbations (ie. roflumilast or azithromycin) when appopriate.  -O2 therapy: Not indicated -Immunizations: UTD -Tobacco use: Quit 2001 -Exercise: Encouraged to stay active, exercise regularly -Bronchodilator therapy: Continue Symbicort bid -Exacerbation prevention: exercise, Symbicort

## 2013-10-07 NOTE — Assessment & Plan Note (Addendum)
He appears to have recovered well from this. His lung exam still demonstrates some crackles in the right base today which is likely due to resolving pneumonia. Today's chest x-ray showed some chronic scarring in the right base which is likely related to his recent pneumonia. Clinically he is doing quite well, so we will continue observation with a repeat chest x-ray on his next visit in 3 months.

## 2013-10-07 NOTE — Progress Notes (Signed)
Subjective:    Patient ID: Antonio Ray, male    DOB: 01-Mar-1934, 80 y.o.   MRN: [id number]  HPI  Antonio Ray is her to see me today because he was recently hospitalized for likely Influenza A and COPD.  He was admitted to the [facility] service for acute encephalopathy i the setting of sweats, fever and cough with encephalopathy.  This all started around February 5 and he became progressively more confused and developed a fever to 103 on 2/10.  He was treated with steroids for presumed COPD, tamiflu and aantibiotics for likley pneumonia with the Flu A.    This is the first time he has seen a pulmonologist.  He never had a respiratory problem before, but he was told that he was at risk for COPD due to the fact that he smoked 1 ppd for about 40 years or so. He quit in 1994.    In the years to months leading up to the hospitalization in 09/2013 he never really had any symptoms of cough or even dyspnea.  He noted a cough right after quitting smoking, but this went away.  He did not have bronchitis much either, maybe once in 4-5 years.  His wife died of COPD so they have  pulseoximeter at home, but his oxygen saturation is usually normal. Sometimes he will drop to 88% with activity on RA. Even though he was started on oxygen after the hospitalziation, he has not dropped below 92% on RA even with exertion as measured by physical therapy at home.    He has been taking symbicort since leaving the hospital.  Past Medical History  Diagnosis Date  . GERD (gastroesophageal reflux disease)   . Coronary artery disease     History of remote anterior MI with PCI to LAD in 2006; most recent cath 2007, no intervention required  . MI, old 2007    ANTERIOR  . Chronic systolic dysfunction of left ventricle     EF 30-35%, CLASS II - III SYMPTOMS; intolerant to Coreg  . Hyperlipidemia   . PVC's (premature ventricular contractions)   . Diabetes mellitus   . DOE (dyspnea on exertion)   . ICD (implantable cardiac  defibrillator) in place August 2012  . Intolerance, drug     Intolerant to beta blockers  . Myocardial infarction   . Pneumonia   . CRF (chronic renal failure)   . BPH (benign prostatic hyperplasia)   . CKD (chronic kidney disease) stage 3, GFR 30-59 ml/min 06/08/2012     Family History  Problem Relation Age of Onset  . Stroke Father      History   Social History  . Marital Status: Widowed    Spouse Name: N/A    Number of Children: 1  . Years of Education: N/A   Occupational History  . electronics technician [employer]   Social History Main Topics  . Smoking status: Former Smoker -- 35 years    Types: Cigarettes    Quit date: 08/01/1992  . Smokeless tobacco: Never Used  . Alcohol Use: 0.0 oz/week    .25 drink(s) per week     Comment: 1 beer every 3-4 months   . Drug Use: No  . Sexual Activity: Not Currently   Other Topics Concern  . Not on file   Social History Narrative   Pt lives in [place] alone.  Widowed.  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  Allergies  Allergen Reactions  . Carvedilol Other (See Comments)    Made dizzy  . Codeine Other (See Comments)    Gets very angry, disoriented  . Dilaudid [Hydromorphone Hcl]     Out of it   . Flomax [Tamsulosin Hcl]     Shortness of breath  . Levofloxacin [Levofloxacin] Other (See Comments)    unknown  . Morphine And Related Other (See Comments)    Gets very angry, disoriented  . Sulfa Antibiotics     Shortness of breath     Outpatient Prescriptions Prior to Visit  Medication Sig Dispense Refill  . aspirin 81 MG EC tablet Take 81 mg by mouth daily.        . benzonatate (TESSALON) 100 MG capsule Take 100 mg by mouth 3 (three) times daily as needed for cough.      . budesonide-formoterol (SYMBICORT) 160-4.5 MCG/ACT inhaler Inhale 2 puffs into the lungs 2 (two) times daily.  1 Inhaler  12  . Cholecalciferol (VITAMIN D PO) Take 2,000 Units by mouth daily. Ecotrin       . fenofibrate (TRICOR) 145 MG tablet TAKE 1 TABLET BY MOUTH DAILY  30 tablet  6  . furosemide (LASIX) 20 MG tablet Take 1 tablet (20 mg total) by mouth daily.  90 tablet  3  . insulin glargine (LANTUS) 100 UNIT/ML injection Inject 20 Units into the skin daily.       Marland Kitchen losartan (COZAAR) 50 MG tablet Take 1 tablet (50 mg total) by mouth daily. PATIENT NEEDS OFFICE VISIT FOR ADDITIONAL REFILLS  30 tablet  0  . nitroGLYCERIN (NITRODUR - DOSED IN MG/24 HR) 0.2 mg/hr patch Place 1 patch (0.2 mg total) onto the skin daily.  30 patch  11  . pravastatin (PRAVACHOL) 40 MG tablet Take 1 tablet (40 mg total) by mouth every evening.  90 tablet  1  . PROVENTIL HFA 108 (90 BASE) MCG/ACT inhaler As directed      . omeprazole (PRILOSEC) 20 MG capsule TAKE ONE CAPSULE BY MOUTH EVERY DAY      . predniSONE (DELTASONE) 20 MG tablet Take 1 tablet by mouth daily X 3 days; then 1/2 tablet by mouth daily X 3 days and stop prednisone  5 tablet  0   No facility-administered medications prior to visit.      Review of Systems  Constitutional: Negative for fever and unexpected weight change.  HENT: Positive for congestion. Negative for dental problem, ear pain, nosebleeds, postnasal drip, rhinorrhea, sinus pressure, sneezing, sore throat and trouble swallowing.   Eyes: Negative for redness and itching.  Respiratory: Positive for shortness of breath. Negative for cough, chest tightness and wheezing.   Cardiovascular: Negative for palpitations and leg swelling.  Gastrointestinal: Negative for nausea and vomiting.  Genitourinary: Negative for dysuria.  Musculoskeletal: Negative for joint swelling.  Skin: Negative for rash.  Neurological: Negative for headaches.  Hematological: Does not bruise/bleed easily.  Psychiatric/Behavioral: Negative for dysphoric mood. The patient is not nervous/anxious.        Objective:   Physical Exam Filed Vitals:   10/07/13 0944  BP: 124/58  Pulse: 71  Height: 5\' 10"  (1.778 m)   Weight: 183 lb (83.008 kg)  SpO2: 92%   RA  Walked 500 feet and O2 saturation stayed above 94% on room air  Gen: well appearing, no acute distress HEENT: NCAT, PERRL, EOMi, OP clear, neck supple without masses PULM: crackles R base > L CV: RRR, no mgr, no JVD AB: BS+,  soft, nontender, no hsm Ext: warm, no edema, no clubbing, no cyanosis Derm: no rash or skin breakdown Neuro: A&Ox4, CN II-XII intact, strength 5/5 in all 4 extremities  10/07/2013 chest x-ray demonstrates emphysema as well as increased interstitial markings in the right base consistent with scar these findings appear to have progressed somewhat since the last study.     Assessment & Plan:   COPD, severe COPD: GOLD Stage IV by spirometry, GRADE C based on minimal symptoms Combined recommendations from the [employer], Investment banker, corporate, [place] (Qaseem A et al, Ann Intern Med. 2011;155(3):179) recommends tobacco cessation, pulmonary rehab (for symptomatic patients with an FEV1 < 50% predicted), supplemental oxygen (for patients with SaO2 <88% or paO2 <55), and appropriate bronchodilator therapy.  In regards to long acting bronchodilators, they recommend monotherapy (FEV1 60-80% with symptoms weak evidence, FEV1 with symptoms <60% strong evidence), or combination therapy (FEV1 <60% with symptoms, strong recommendation, moderate evidence).  One should also provide patients with annual immunizations and consider therapy for prevention of COPD exacerbations (ie. roflumilast or azithromycin) when appopriate.  -O2 therapy: Not indicated -Immunizations: UTD -Tobacco use: Quit 2001 -Exercise: Encouraged to stay active, exercise regularly -Bronchodilator therapy: Continue Symbicort bid -Exacerbation prevention: exercise, Symbicort   Pneumonia, community acquired He appears to have recovered well from this. His lung exam still demonstrates  some crackles in the right base today which is likely due to resolving pneumonia. Today's chest x-ray showed some chronic scarring in the right base which is likely related to his recent pneumonia. Clinically he is doing quite well, so we will continue observation with a repeat chest x-ray on his next visit in 3 months.    Updated Medication List Outpatient Encounter Prescriptions as of 10/07/2013  Medication Sig  . aspirin 81 MG EC tablet Take 81 mg by mouth daily.    . benzonatate (TESSALON) 100 MG capsule Take 100 mg by mouth 3 (three) times daily as needed for cough.  . budesonide-formoterol (SYMBICORT) 160-4.5 MCG/ACT inhaler Inhale 2 puffs into the lungs 2 (two) times daily.  . Cholecalciferol (VITAMIN D PO) Take 2,000 Units by mouth daily. Ecotrin  . fenofibrate (TRICOR) 145 MG tablet TAKE 1 TABLET BY MOUTH DAILY  . furosemide (LASIX) 20 MG tablet Take 1 tablet (20 mg total) by mouth daily.  . insulin glargine (LANTUS) 100 UNIT/ML injection Inject 20 Units into the skin daily.   Marland Kitchen losartan (COZAAR) 50 MG tablet Take 1 tablet (50 mg total) by mouth daily. PATIENT NEEDS OFFICE VISIT FOR ADDITIONAL REFILLS  . nitroGLYCERIN (NITRODUR - DOSED IN MG/24 HR) 0.2 mg/hr patch Place 1 patch (0.2 mg total) onto the skin daily.  . pravastatin (PRAVACHOL) 40 MG tablet Take 1 tablet (40 mg total) by mouth every evening.  Marland Kitchen PROVENTIL HFA 108 (90 BASE) MCG/ACT inhaler As directed  . omeprazole (PRILOSEC) 20 MG capsule TAKE ONE CAPSULE BY MOUTH EVERY DAY  . [DISCONTINUED] predniSONE (DELTASONE) 20 MG tablet Take 1 tablet by mouth daily X 3 days; then 1/2 tablet by mouth daily X 3 days and stop prednisone

## 2013-10-08 ENCOUNTER — Telehealth: Payer: Self-pay

## 2013-10-08 DIAGNOSIS — J449 Chronic obstructive pulmonary disease, unspecified: Secondary | ICD-10-CM

## 2013-10-08 NOTE — Telephone Encounter (Signed)
Message copied by Len Blalock on Tue Oct 08, 2013 10:52 AM ------      Message from: Simonne Maffucci B      Created: Mon Oct 07, 2013  6:58 PM       Caryl Pina,            Please let Mr. Biggins know that his chest x-ray showed some scarring in the right lung which is likely related to his recent pneumonia.            We will need to order a chest x-ray prior to the next visit.            Thanks      Ruby Cola ------

## 2013-10-08 NOTE — Telephone Encounter (Signed)
Pt aware of results and recs.  Future order for cxr placed.  Nothing further needed at this time.

## 2013-10-08 NOTE — Telephone Encounter (Signed)
Order has been placed for d/c of oxygen. Pt's son is aware.

## 2013-10-30 ENCOUNTER — Ambulatory Visit: Payer: Federal, State, Local not specified - PPO | Admitting: Gastroenterology

## 2013-10-30 ENCOUNTER — Encounter: Payer: Self-pay | Admitting: Gastroenterology

## 2013-10-30 VITALS — BP 100/58 | HR 80 | Ht 67.0 in | Wt 184.1 lb

## 2013-10-30 DIAGNOSIS — R1319 Other dysphagia: Secondary | ICD-10-CM

## 2013-10-30 DIAGNOSIS — R1310 Dysphagia, unspecified: Secondary | ICD-10-CM | POA: Insufficient documentation

## 2013-10-30 DIAGNOSIS — K219 Gastro-esophageal reflux disease without esophagitis: Secondary | ICD-10-CM

## 2013-10-30 MED ORDER — PANTOPRAZOLE SODIUM 20 MG PO TBEC: 20.0000 mg | DELAYED_RELEASE_TABLET | Freq: Every day | ORAL | Status: DC

## 2013-10-30 NOTE — Patient Instructions (Signed)
You have been scheduled for a Barium Esophogram at [facility]iology (1st floor of the hospital) on 4/6/2015at 11am. Please arrive 15 minutes prior to your appointment for registration. Make certain not to have anything to eat or drink 6 hours prior to your test. If you need to reschedule for any reason, please contact radiology at [phone] to do so. __________________________________________________________________ A barium swallow is an examination that concentrates on views of the esophagus. This tends to be a double contrast exam (barium and two liquids which, when combined, create a gas to distend the wall of the oesophagus) or single contrast (non-ionic iodine based). The study is usually tailored to your symptoms so a good history is essential. Attention is paid during the study to the form, structure and configuration of the esophagus, looking for functional disorders (such as aspiration, dysphagia, achalasia, motility and reflux) EXAMINATION You may be asked to change into a gown, depending on the type of swallow being performed. A radiologist and radiographer will perform the procedure. The radiologist will advise you of the type of contrast selected for your procedure and direct you during the exam. You will be asked to stand, sit or lie in several different positions and to hold a small amount of fluid in your mouth before being asked to swallow while the imaging is performed .In some instances you may be asked to swallow barium coated marshmallows to assess the motility of a solid food bolus. The exam can be recorded as a digital or video fluoroscopy procedure. POST PROCEDURE It will take 1-2 days for the barium to pass through your system. To facilitate this, it is important, unless otherwise directed, to increase your fluids for the next 24-48hrs and to resume your normal diet.  This test typically takes about 30 minutes to  perform. __________________________________________________________________________________

## 2013-10-30 NOTE — Assessment & Plan Note (Signed)
Persistent dysphagia or raises the question of a peptic esophageal stricture.  This certainly may be contributing to his symptoms of regurgitation.  All I think he could benefit from dilatation therapy, in view of his comorbidities, I will obtain a barium swallow first to better ascertain his anatomy and reassess.

## 2013-10-30 NOTE — Progress Notes (Signed)
_                                                                                                                History of Present Illness: 80 year old white male with coronary artery disease, chronic congestive heart failure, diabetes, COPD, status post implantable defibrillator, referred for evaluation of reflux.  He has very frequent regurgitation of gastric contents.  This often happens while he is eating or soon after eating.  He denies pyrosis, per se.  Symptoms were especially severe with his recent bout of flu and pneumonia.  He was taking omeprazole but was switched to Carafate.  Symptoms have improved.  He does have dysphagia to solids.  He is without sore throat, cough or abdominal pain.    Past Medical History  Diagnosis Date  . GERD (gastroesophageal reflux disease)   . Coronary artery disease     History of remote anterior MI with PCI to LAD in 2006; most recent cath 2007, no intervention required  . MI, old 2007    ANTERIOR  . Chronic systolic dysfunction of left ventricle     EF 30-35%, CLASS II - III SYMPTOMS; intolerant to Coreg  . Hyperlipidemia   . PVC's (premature ventricular contractions)   . Diabetes mellitus   . DOE (dyspnea on exertion)   . ICD (implantable cardiac defibrillator) in place August 2012  . Intolerance, drug     Intolerant to beta blockers  . Myocardial infarction   . Pneumonia   . CRF (chronic renal failure)   . BPH (benign prostatic hyperplasia)   . CKD (chronic kidney disease) stage 3, GFR 30-59 ml/min 06/08/2012  . CHF (congestive heart failure)   . COPD (chronic obstructive pulmonary disease)    Past Surgical History  Procedure Laterality Date  . Cardiac catheterization  01/11/2006    DEMONSTRATES AKINESIA OF THE DISTAL ANTERIOR WALL, DISTAL INFERIOR WALL AND AKINESIA OF THE APEX. THE BASAL SEGMENTS CONTRACT WELL AND OVERALL EF 35%  . Coronary angioplasty  1994    TO THE LAD  . Foot surgery Right     remote  .  Cholecystectomy  2/11  . Icd implant  03/30/11    Analyze ST study patient   family history includes Stroke in his father. Current Outpatient Prescriptions  Medication Sig Dispense Refill  . aspirin 81 MG EC tablet Take 81 mg by mouth daily.        . budesonide-formoterol (SYMBICORT) 160-4.5 MCG/ACT inhaler Inhale 2 puffs into the lungs 2 (two) times daily.  1 Inhaler  12  . Cholecalciferol (VITAMIN D PO) Take 2,000 Units by mouth daily. Ecotrin      . fenofibrate (TRICOR) 145 MG tablet TAKE 1 TABLET BY MOUTH DAILY  30 tablet  6  . furosemide (LASIX) 20 MG tablet Take 1 tablet (20 mg total) by mouth daily.  90 tablet  3  . insulin glargine (LANTUS) 100 UNIT/ML injection Inject 20 Units into the skin daily.       Marland Kitchen losartan (  COZAAR) 50 MG tablet Take 1 tablet (50 mg total) by mouth daily. PATIENT NEEDS OFFICE VISIT FOR ADDITIONAL REFILLS  30 tablet  0  . nitroGLYCERIN (NITRODUR - DOSED IN MG/24 HR) 0.2 mg/hr patch Place 1 patch (0.2 mg total) onto the skin daily.  30 patch  11  . pravastatin (PRAVACHOL) 40 MG tablet Take 1 tablet (40 mg total) by mouth every evening.  90 tablet  1  . PROVENTIL HFA 108 (90 BASE) MCG/ACT inhaler As directed      . sucralfate (CARAFATE) 1 G tablet Take 1 g by mouth 2 (two) times daily.        No current facility-administered medications for this visit.   Allergies as of 10/30/2013 - Review Complete 10/30/2013  Allergen Reaction Noted  . Carvedilol Other (See Comments) 04/18/2011  . Codeine Other (See Comments)   . Dilaudid [hydromorphone hcl]  09/10/2013  . Flomax [tamsulosin hcl]  05/19/2012  . Levofloxacin [levofloxacin] Other (See Comments) 05/20/2011  . Morphine and related Other (See Comments) 05/20/2011  . Sulfa antibiotics  05/23/2012    reports that he quit smoking about 21 years ago. His smoking use included Cigarettes. He has a 35 pack-year smoking history. He has never used smokeless tobacco. He reports that he drinks alcohol. He reports that he  does not use illicit drugs.     Review of Systems: Pertinent positive and negative review of systems were noted in the above HPI section. All other review of systems were otherwise negative.  Vital signs were reviewed in today's medical record Physical Exam: General: Well developed , well nourished, no acute distress Skin: anicteric Head: Normocephalic and atraumatic Eyes:  sclerae anicteric, EOMI Ears: Normal auditory acuity Mouth: No deformity or lesions Neck: Supple, no masses or thyromegaly Lungs: Few dry rales in the posterior left lung Heart: Regular rate and rhythm; no murmurs, rubs or bruits Abdomen: Soft, non tender and non distended. No masses, hepatosplenomegaly or hernias noted. Normal Bowel sounds.  No succussion splash Rectal:deferred Musculoskeletal: Symmetrical with no gross deformities  Skin: No lesions on visible extremities Pulses:  Normal pulses noted Extremities: No clubbing, cyanosis, edema or deformities noted Neurological: Alert oriented x 4, grossly nonfocal Cervical Nodes:  No significant cervical adenopathy Inguinal Nodes: No significant inguinal adenopathy Psychological:  Alert and cooperative. Normal mood and affect  See Assessment and Plan under Problem List

## 2013-10-30 NOTE — Assessment & Plan Note (Addendum)
Symptoms are primarily manifest by regurgitation of gastric contents rather than pyrosis, per se.  Recently symptoms have improved.  He could have a large hiatal hernia or simply free reflux.  Recommendations #1 switch from Carafate to Protonix 40 mg daily #2 barium swallow

## 2013-11-04 ENCOUNTER — Ambulatory Visit (HOSPITAL_COMMUNITY): Payer: Federal, State, Local not specified - PPO

## 2013-11-05 ENCOUNTER — Ambulatory Visit (HOSPITAL_COMMUNITY)
Admission: RE | Admit: 2013-11-05 | Discharge: 2013-11-05 | Disposition: A | Discharge status: Home / Self Care | Payer: Federal, State, Local not specified - PPO | Source: Ambulatory Visit | Attending: Gastroenterology | Admitting: Gastroenterology

## 2013-11-05 DIAGNOSIS — R1319 Other dysphagia: Secondary | ICD-10-CM

## 2013-11-05 DIAGNOSIS — K222 Esophageal obstruction: Secondary | ICD-10-CM | POA: Insufficient documentation

## 2013-11-05 DIAGNOSIS — R1310 Dysphagia, unspecified: Secondary | ICD-10-CM | POA: Insufficient documentation

## 2013-11-14 ENCOUNTER — Other Ambulatory Visit: Payer: Self-pay

## 2013-11-14 ENCOUNTER — Telehealth: Payer: Self-pay | Admitting: Gastroenterology

## 2013-11-14 DIAGNOSIS — K222 Esophageal obstruction: Secondary | ICD-10-CM

## 2013-11-14 NOTE — Telephone Encounter (Signed)
Spoke with pts son regarding pt having an EGD with balloon dil. Son states he wants to discuss the procedure with his son and decide what they want to do. States they will call us back and let us know if they want to proceed with the procedure or not.

## 2013-11-15 ENCOUNTER — Other Ambulatory Visit: Payer: Self-pay

## 2013-11-15 DIAGNOSIS — K222 Esophageal obstruction: Secondary | ICD-10-CM

## 2013-11-22 ENCOUNTER — Ambulatory Visit (INDEPENDENT_AMBULATORY_CARE_PROVIDER_SITE_OTHER): Payer: Federal, State, Local not specified - PPO | Admitting: *Deleted

## 2013-11-22 ENCOUNTER — Encounter: Payer: Self-pay | Admitting: Internal Medicine

## 2013-11-22 DIAGNOSIS — I5022 Chronic systolic (congestive) heart failure: Secondary | ICD-10-CM

## 2013-11-22 DIAGNOSIS — I509 Heart failure, unspecified: Secondary | ICD-10-CM

## 2013-11-24 LAB — MDC_IDC_ENUM_SESS_TYPE_REMOTE
Brady Statistic RV Percent Paced: 1 %
HighPow Impedance: 84 Ohm
Implantable Pulse Generator Serial Number: 828604
Lead Channel Impedance Value: 430 Ohm
Lead Channel Impedance Value: 440 Ohm
Lead Channel Pacing Threshold Amplitude: 0.5 V
Lead Channel Pacing Threshold Amplitude: 0.75 V
Lead Channel Pacing Threshold Pulse Width: 0.4 ms
Lead Channel Pacing Threshold Pulse Width: 0.5 ms
Lead Channel Sensing Intrinsic Amplitude: 1.4 mV
Lead Channel Setting Pacing Amplitude: 2 V
Lead Channel Setting Pacing Amplitude: 2.5 V
Lead Channel Setting Pacing Pulse Width: 0.5 ms
Lead Channel Setting Sensing Sensitivity: 0.5 mV
MDC IDC MSMT BATTERY REMAINING LONGEVITY: 64 mo
MDC IDC MSMT LEADCHNL RV SENSING INTR AMPL: 12 mV
MDC IDC STAT BRADY RA PERCENT PACED: 43 %
Zone Setting Detection Interval: 280 ms

## 2013-12-01 ENCOUNTER — Encounter: Payer: Self-pay | Admitting: Internal Medicine

## 2013-12-03 ENCOUNTER — Encounter: Payer: Self-pay | Admitting: Gastroenterology

## 2013-12-03 NOTE — Progress Notes (Signed)
ICD remote received. Sensing, impedances, auto capture thresholds consistent with previous device measurements. Histograms appropriate for patient and level of activity. 1 mode switch episode recorded <1%. No ventricular arrhythmias recorded. All other diagnostic data reviewed and is appropriate and stable for patient. Real time EGM demonstrates appropriate sensing and capture. Estimated longevity 5.4 years.  Plan to check remotely in 3 months, see in office annually.  Next remote 02/24/14.

## 2013-12-04 ENCOUNTER — Telehealth: Payer: Self-pay | Admitting: Pulmonary Disease

## 2013-12-04 NOTE — Telephone Encounter (Signed)
Error.  MyChart message.  Antonio Ray

## 2013-12-19 ENCOUNTER — Encounter: Payer: Self-pay | Admitting: Cardiology

## 2013-12-21 ENCOUNTER — Other Ambulatory Visit: Payer: Self-pay | Admitting: Cardiology

## 2013-12-31 ENCOUNTER — Encounter: Payer: Self-pay | Admitting: Cardiology

## 2013-12-31 ENCOUNTER — Ambulatory Visit (INDEPENDENT_AMBULATORY_CARE_PROVIDER_SITE_OTHER): Payer: Federal, State, Local not specified - PPO | Admitting: Cardiology

## 2013-12-31 VITALS — BP 118/58 | HR 72 | Ht 67.0 in | Wt 185.0 lb

## 2013-12-31 DIAGNOSIS — I495 Sick sinus syndrome: Secondary | ICD-10-CM

## 2013-12-31 DIAGNOSIS — I2510 Atherosclerotic heart disease of native coronary artery without angina pectoris: Secondary | ICD-10-CM

## 2013-12-31 DIAGNOSIS — I509 Heart failure, unspecified: Secondary | ICD-10-CM

## 2013-12-31 DIAGNOSIS — Z95810 Presence of automatic (implantable) cardiac defibrillator: Secondary | ICD-10-CM

## 2013-12-31 DIAGNOSIS — I5022 Chronic systolic (congestive) heart failure: Secondary | ICD-10-CM

## 2013-12-31 NOTE — Patient Instructions (Signed)
Continue your current therapy  I will see you in 6 months.   
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
## 2014-01-03 ENCOUNTER — Ambulatory Visit (INDEPENDENT_AMBULATORY_CARE_PROVIDER_SITE_OTHER)
Admission: RE | Admit: 2014-01-03 | Discharge: 2014-01-03 | Disposition: A | Discharge status: Home / Self Care | Payer: Federal, State, Local not specified - PPO | Source: Ambulatory Visit | Attending: Pulmonary Disease | Admitting: Pulmonary Disease

## 2014-01-03 ENCOUNTER — Ambulatory Visit: Payer: Federal, State, Local not specified - PPO | Admitting: Pulmonary Disease

## 2014-01-03 ENCOUNTER — Encounter: Payer: Self-pay | Admitting: Pulmonary Disease

## 2014-01-03 VITALS — BP 124/66 | HR 68 | Ht 70.0 in | Wt 186.0 lb

## 2014-01-03 DIAGNOSIS — J449 Chronic obstructive pulmonary disease, unspecified: Secondary | ICD-10-CM

## 2014-01-03 DIAGNOSIS — R9389 Abnormal findings on diagnostic imaging of other specified body structures: Secondary | ICD-10-CM

## 2014-01-03 NOTE — Patient Instructions (Signed)
Take spiriva two puffs daily with Symbicort Let me know how you like the Spiriva, if it is helping we will call in an Rx We will arrange a CT chest to look at your lung scarring and call you with the result We will see you back in 6 months or sooner if needed

## 2014-01-03 NOTE — Progress Notes (Signed)
Subjective:    Patient ID: Antonio Ray, male    DOB: 1933/12/22, 80 y.o.   MRN: [id number]   synopsis: First the [facility] in 2015 for severe COPD. His FEV1 at that time was 29% predicted. He also has a scar in his right lung base believed to be related to pneumonia.  HPI  01/03/2014> [name] has been doing okay but he says he thinks his shortness of breath has increased a little but the last few months. This is been associated with walking. He walks 1 mile a day for exercise. He says that he has had to stop more frequently to catch his breath. He continues to take his Symbicort twice a day. He does not have significant mucus production other than the occasional production of clear phlegm. He has not had sinus symptoms. No chest pain or leg swelling that's new. His weight has been stable. He continues to work regularly. He recently went on vacation and did a significant amount walking.  Past Medical History  Diagnosis Date  . GERD (gastroesophageal reflux disease)   . Coronary artery disease     History of remote anterior MI with PCI to LAD in 2006; most recent cath 2007, no intervention required  . MI, old 2007    ANTERIOR  . Chronic systolic dysfunction of left ventricle     EF 30-35%, CLASS II - III SYMPTOMS; intolerant to Coreg  . Hyperlipidemia   . PVC's (premature ventricular contractions)   . Diabetes mellitus   . DOE (dyspnea on exertion)   . ICD (implantable cardiac defibrillator) in place August 2012  . Intolerance, drug     Intolerant to beta blockers  . Myocardial infarction   . Pneumonia   . CRF (chronic renal failure)   . BPH (benign prostatic hyperplasia)   . CKD (chronic kidney disease) stage 3, GFR 30-59 ml/min 06/08/2012  . CHF (congestive heart failure)   . COPD (chronic obstructive pulmonary disease)      Review of Systems     Objective:   Physical Exam  Filed Vitals:   01/03/14 1018  BP: 124/66  Pulse: 68  Height: 5\' 10"  (1.778  m)  Weight: 186 lb (84.369 kg)  SpO2: 97%  Ra  Gen: well appearing, no acute distress HEENT: NCAT, EOMi, OP clear PULM: Few wheezes and crackles in R lung base, otherwise clear CV: RRR, no mgr, no JVD AB: BS+, soft, nontender, no hsm Ext: warm, no edema, no clubbing, no cyanosis Derm: no rash or skin breakdown  01/03/2014 CXR > improved aeration R lung base, scarring still noted      Assessment & Plan:   COPD, severe I think his increased shortness of breath has been due to the fact that the weather is warmer outside. However, he does have severe airflow obstruction and so it's worth a trial of Spiriva to see if it can make a difference.  Plan: -Add Spiriva, he will call for a prescription if it seems to be making a difference -Continue Symbicort -Flu shot in the fall -Followup 6 months  Abnormal CXR The scar in the right lung base has improved somewhat but is still persistent. Given his smoking history and his increasing shortness of breath or like to get a CT scan to make sure that we're not dealing with a malignancy of some sort.    Updated Medication List Outpatient Encounter Prescriptions as of 01/03/2014  Medication Sig  . aspirin 81 MG EC tablet  Take 81 mg by mouth daily.    . budesonide-formoterol (SYMBICORT) 160-4.5 MCG/ACT inhaler Inhale 2 puffs into the lungs 2 (two) times daily.  . Cholecalciferol (VITAMIN D PO) Take 2,000 Units by mouth daily. Ecotrin  . fenofibrate (TRICOR) 145 MG tablet TAKE 1 TABLET BY MOUTH DAILY  . furosemide (LASIX) 20 MG tablet Take 1 tablet (20 mg total) by mouth daily.  . insulin glargine (LANTUS) 100 UNIT/ML injection Inject 22 Units into the skin daily.   Marland Kitchen losartan (COZAAR) 50 MG tablet Take 25 mg by mouth daily.  . nitroGLYCERIN (NITRODUR - DOSED IN MG/24 HR) 0.2 mg/hr patch Place 1 patch (0.2 mg total) onto the skin daily.  . pantoprazole (PROTONIX) 20 MG tablet Take 1 tablet (20 mg total) by mouth daily.  . pravastatin (PRAVACHOL)  40 MG tablet TAKE 1 TABLET BY MOUTH EVERY EVENING  . PROVENTIL HFA 108 (90 BASE) MCG/ACT inhaler As directed

## 2014-01-03 NOTE — Assessment & Plan Note (Signed)
I think his increased shortness of breath has been due to the fact that the weather is warmer outside. However, he does have severe airflow obstruction and so it's worth a trial of Spiriva to see if it can make a difference.  Plan: -Add Spiriva, he will call for a prescription if it seems to be making a difference -Continue Symbicort -Flu shot in the fall -Followup 6 months

## 2014-01-03 NOTE — Assessment & Plan Note (Signed)
The scar in the right lung base has improved somewhat but is still persistent. Given his smoking history and his increasing shortness of breath or like to get a CT scan to make sure that we're not dealing with a malignancy of some sort.

## 2014-01-07 ENCOUNTER — Ambulatory Visit (HOSPITAL_COMMUNITY)
Admission: RE | Admit: 2014-01-07 | Payer: Federal, State, Local not specified - PPO | Source: Ambulatory Visit | Admitting: Gastroenterology

## 2014-01-07 ENCOUNTER — Encounter (HOSPITAL_COMMUNITY): Admission: RE | Payer: Self-pay | Source: Ambulatory Visit

## 2014-01-07 SURGERY — EGD (ESOPHAGOGASTRODUODENOSCOPY)
Anesthesia: Monitor Anesthesia Care

## 2014-01-14 ENCOUNTER — Ambulatory Visit (INDEPENDENT_AMBULATORY_CARE_PROVIDER_SITE_OTHER)
Admission: RE | Admit: 2014-01-14 | Discharge: 2014-01-14 | Disposition: A | Discharge status: Home / Self Care | Payer: Federal, State, Local not specified - PPO | Source: Ambulatory Visit | Attending: Pulmonary Disease | Admitting: Pulmonary Disease

## 2014-01-14 DIAGNOSIS — R9389 Abnormal findings on diagnostic imaging of other specified body structures: Secondary | ICD-10-CM

## 2014-01-14 DIAGNOSIS — R918 Other nonspecific abnormal finding of lung field: Secondary | ICD-10-CM

## 2014-01-15 ENCOUNTER — Encounter: Payer: Self-pay | Admitting: Pulmonary Disease

## 2014-01-15 NOTE — Progress Notes (Signed)
Quick Note:  Spoke with pt, he is aware of results. Nothing further needed. ______

## 2014-01-19 ENCOUNTER — Other Ambulatory Visit: Payer: Self-pay | Admitting: Cardiology

## 2014-01-19 ENCOUNTER — Other Ambulatory Visit: Payer: Self-pay | Admitting: Gastroenterology

## 2014-01-29 ENCOUNTER — Telehealth: Payer: Self-pay | Admitting: Gastroenterology

## 2014-01-29 ENCOUNTER — Encounter: Payer: Self-pay | Admitting: Gastroenterology

## 2014-01-29 MED ORDER — PANTOPRAZOLE SODIUM 20 MG PO TBEC: DELAYED_RELEASE_TABLET | ORAL | Status: DC

## 2014-01-29 NOTE — Telephone Encounter (Signed)
Med sent to [facility]

## 2014-02-13 NOTE — Telephone Encounter (Signed)
Close encounter 

## 2014-02-14 ENCOUNTER — Encounter: Payer: Self-pay | Admitting: Pulmonary Disease

## 2014-02-14 ENCOUNTER — Encounter: Payer: Self-pay | Admitting: Cardiology

## 2014-02-17 MED ORDER — TIOTROPIUM BROMIDE MONOHYDRATE 2.5 MCG/ACT IN AERS: 2.0000 | INHALATION_SPRAY | Freq: Every day | RESPIRATORY_TRACT | Status: DC

## 2014-02-27 ENCOUNTER — Ambulatory Visit (INDEPENDENT_AMBULATORY_CARE_PROVIDER_SITE_OTHER): Payer: Federal, State, Local not specified - PPO | Admitting: *Deleted

## 2014-02-27 DIAGNOSIS — I428 Other cardiomyopathies: Secondary | ICD-10-CM

## 2014-02-27 DIAGNOSIS — I495 Sick sinus syndrome: Secondary | ICD-10-CM

## 2014-02-27 LAB — MDC_IDC_ENUM_SESS_TYPE_INCLINIC
Battery Remaining Longevity: 72 mo
Brady Statistic RA Percent Paced: 44 %
Brady Statistic RV Percent Paced: 0.2 %
Date Time Interrogation Session: 20150730130822
HighPow Impedance: 81 Ohm
Implantable Pulse Generator Serial Number: 828604
Lead Channel Impedance Value: 425 Ohm
Lead Channel Pacing Threshold Amplitude: 0.5 V
Lead Channel Pacing Threshold Amplitude: 0.75 V
Lead Channel Pacing Threshold Amplitude: 0.75 V
Lead Channel Pacing Threshold Pulse Width: 0.5 ms
Lead Channel Pacing Threshold Pulse Width: 0.5 ms
Lead Channel Sensing Intrinsic Amplitude: 1.2 mV
Lead Channel Sensing Intrinsic Amplitude: 12 mV
Lead Channel Setting Pacing Amplitude: 2 V
Lead Channel Setting Pacing Pulse Width: 0.5 ms
Lead Channel Setting Sensing Sensitivity: 0.5 mV
MDC IDC MSMT LEADCHNL RA PACING THRESHOLD PULSEWIDTH: 0.5 ms
MDC IDC MSMT LEADCHNL RV IMPEDANCE VALUE: 475 Ohm
MDC IDC MSMT LEADCHNL RV PACING THRESHOLD AMPLITUDE: 0.5 V
MDC IDC MSMT LEADCHNL RV PACING THRESHOLD PULSEWIDTH: 0.5 ms
MDC IDC SET LEADCHNL RV PACING AMPLITUDE: 2.5 V
MDC IDC SET ZONE DETECTION INTERVAL: 280 ms

## 2014-02-27 NOTE — Progress Notes (Signed)
ICD check in clinic. Normal device function. Thresholds and sensing consistent with previous device measurements. Impedance trends stable over time. No evidence of any ventricular arrhythmias. 1 mode switch, 6 seconds.   Histogram distribution appropriate for patient and level of activity. No changes made this session. Device programmed at appropriate safety margins. Device programmed to optimize intrinsic conduction. Estimated longevity 5.2 years. Pt enrolled in remote follow-up. Plan to check device every 3 months remotely and in office annually. Patient education completed including shock plan. Alert tones/vibration demonstrated for patient.  Merlin 06/02/14.  Checked by [employer] forces training and education officer for research.

## 2014-03-17 ENCOUNTER — Encounter: Payer: Self-pay | Admitting: Internal Medicine

## 2014-04-01 ENCOUNTER — Other Ambulatory Visit: Payer: Self-pay | Admitting: Cardiology

## 2014-04-18 ENCOUNTER — Other Ambulatory Visit: Payer: Self-pay | Admitting: Gastroenterology

## 2014-04-18 ENCOUNTER — Other Ambulatory Visit: Payer: Self-pay | Admitting: Cardiology

## 2014-05-26 ENCOUNTER — Encounter: Payer: Self-pay | Admitting: Internal Medicine

## 2014-06-02 ENCOUNTER — Encounter: Payer: Self-pay | Admitting: Internal Medicine

## 2014-06-02 ENCOUNTER — Ambulatory Visit (INDEPENDENT_AMBULATORY_CARE_PROVIDER_SITE_OTHER): Payer: Medicare Other | Admitting: *Deleted

## 2014-06-02 DIAGNOSIS — I495 Sick sinus syndrome: Secondary | ICD-10-CM

## 2014-06-02 LAB — MDC_IDC_ENUM_SESS_TYPE_REMOTE
Battery Remaining Longevity: 57 mo
Brady Statistic RA Percent Paced: 44 %
Brady Statistic RV Percent Paced: 1 %
HIGH POWER IMPEDANCE MEASURED VALUE: 81 Ohm
Implantable Pulse Generator Serial Number: 828604
Lead Channel Impedance Value: 400 Ohm
Lead Channel Impedance Value: 430 Ohm
Lead Channel Pacing Threshold Amplitude: 12 V
Lead Channel Pacing Threshold Pulse Width: 0.5 ms
Lead Channel Setting Pacing Amplitude: 2 V
Lead Channel Setting Pacing Pulse Width: 0.5 ms
Lead Channel Setting Sensing Sensitivity: 0.5 mV
MDC IDC MSMT LEADCHNL RA PACING THRESHOLD AMPLITUDE: 0.75 V
MDC IDC MSMT LEADCHNL RA SENSING INTR AMPL: 1.2 mV
MDC IDC SET LEADCHNL RV PACING AMPLITUDE: 2.5 V
Zone Setting Detection Interval: 280 ms

## 2014-06-02 NOTE — Progress Notes (Signed)
Remote ICD transmission.   

## 2014-06-09 ENCOUNTER — Encounter: Payer: Self-pay | Admitting: Cardiology

## 2014-06-09 NOTE — Telephone Encounter (Signed)
Spoke to patient 06/12/14 appointment with Dr.Jordan rescheduled to 07/08/14 at 10:15 am.Patient stated he was doing good, ok to move appt to 07/08/14.

## 2014-06-12 ENCOUNTER — Ambulatory Visit: Payer: Federal, State, Local not specified - PPO | Admitting: Cardiology

## 2014-06-18 ENCOUNTER — Encounter: Payer: Self-pay | Admitting: Cardiology

## 2014-06-25 ENCOUNTER — Encounter: Payer: Self-pay | Admitting: Internal Medicine

## 2014-07-08 ENCOUNTER — Encounter: Payer: Self-pay | Admitting: Cardiology

## 2014-07-08 ENCOUNTER — Ambulatory Visit (INDEPENDENT_AMBULATORY_CARE_PROVIDER_SITE_OTHER): Payer: Medicare Other | Admitting: Cardiology

## 2014-07-08 VITALS — BP 106/52 | HR 67 | Ht 68.0 in | Wt 193.3 lb

## 2014-07-08 DIAGNOSIS — Z95810 Presence of automatic (implantable) cardiac defibrillator: Secondary | ICD-10-CM

## 2014-07-08 DIAGNOSIS — J449 Chronic obstructive pulmonary disease, unspecified: Secondary | ICD-10-CM

## 2014-07-08 DIAGNOSIS — I5022 Chronic systolic (congestive) heart failure: Secondary | ICD-10-CM

## 2014-07-08 DIAGNOSIS — N183 Chronic kidney disease, stage 3 unspecified: Secondary | ICD-10-CM

## 2014-07-08 DIAGNOSIS — I2510 Atherosclerotic heart disease of native coronary artery without angina pectoris: Secondary | ICD-10-CM

## 2014-07-08 NOTE — Patient Instructions (Addendum)
Continue your current therapy  I will see you in 6 months.  You should see about having complete lab work done with Dr. Darron Doom.

## 2014-07-08 NOTE — Progress Notes (Signed)
Antonio Ray Date of Birth: Oct 15, 1933 Medical Record #[id number]  History of Present Illness: Antonio Ray is seen back for follow up of CAD. He has a history of coronary disease with remote myocardial infarction in 1994. He had emergent angioplasty of the LAD. Cardiac catheterization 2007 showed nonobstructive disease. He has an ischemic cardiomyopathy with ejection fraction of 30-35%. His last Myoview in 2011 showed an anterior wall scar without ischemia. He states he is doing well from a cardiac standpoint. He's had no recent chest pain or palpiations. He's had no increase in edema. He does have severe COPD and some scarring related to prior PNA. He was started on Spiriva with improvement but notes it costs $100/month. He has occasional heartburn relieved with drinking water. He is planning to have cataract surgery in Jan.   Current Outpatient Prescriptions on File Prior to Visit  Medication Sig Dispense Refill  . aspirin 81 MG EC tablet Take 81 mg by mouth daily.      . budesonide-formoterol (SYMBICORT) 160-4.5 MCG/ACT inhaler Inhale 2 puffs into the lungs 2 (two) times daily. 1 Inhaler 12  . Cholecalciferol (VITAMIN D PO) Take 2,000 Units by mouth daily. Ecotrin    . fenofibrate (TRICOR) 145 MG tablet TAKE 1 TABLET BY MOUTH DAILY 90 tablet 0  . furosemide (LASIX) 20 MG tablet TAKE 1 TABLET BY MOUTH DAILY. 90 tablet 0  . insulin glargine (LANTUS) 100 UNIT/ML injection Inject 22 Units into the skin daily.     Marland Kitchen losartan (COZAAR) 50 MG tablet Take 25 mg by mouth daily.    . nitroGLYCERIN (NITRODUR - DOSED IN MG/24 HR) 0.2 mg/hr patch PLACE 1 PATCH ONTO THE SKIN DAILY 90 patch 4  . pantoprazole (PROTONIX) 20 MG tablet TAKE 1 TABLET BY MOUTH EVERY DAY 90 tablet 0  . pravastatin (PRAVACHOL) 40 MG tablet TAKE 1 TABLET BY MOUTH EVERY EVENING 90 tablet 0  . PROVENTIL HFA 108 (90 BASE) MCG/ACT inhaler As directed    . Tiotropium Bromide Monohydrate (SPIRIVA RESPIMAT) 2.5 MCG/ACT AERS Inhale 2  puffs into the lungs daily. 1 Inhaler 6   No current facility-administered medications on file prior to visit.    Allergies  Allergen Reactions  . Carvedilol Other (See Comments)    Made dizzy  . Codeine Other (See Comments)    Gets very angry, disoriented  . Dilaudid [Hydromorphone Hcl]     Out of it   . Flomax [Tamsulosin Hcl]     Shortness of breath  . Levofloxacin [Levofloxacin] Other (See Comments)    unknown  . Morphine And Related Other (See Comments)    Gets very angry, disoriented  . Sulfa Antibiotics     Shortness of breath    Past Medical History  Diagnosis Date  . GERD (gastroesophageal reflux disease)   . Coronary artery disease     History of remote anterior MI with PCI to LAD in 2006; most recent cath 2007, no intervention required  . MI, old 2007    ANTERIOR  . Chronic systolic dysfunction of left ventricle     EF 30-35%, CLASS II - III SYMPTOMS; intolerant to Coreg  . Hyperlipidemia   . PVC's (premature ventricular contractions)   . Diabetes mellitus   . DOE (dyspnea on exertion)   . ICD (implantable cardiac defibrillator) in place August 2012  . Intolerance, drug     Intolerant to beta blockers  . Myocardial infarction   . Pneumonia   . CRF (chronic renal failure)   .  BPH (benign prostatic hyperplasia)   . CKD (chronic kidney disease) stage 3, GFR 30-59 ml/min 06/08/2012  . CHF (congestive heart failure)   . COPD (chronic obstructive pulmonary disease)     Past Surgical History  Procedure Laterality Date  . Cardiac catheterization  01/11/2006    DEMONSTRATES AKINESIA OF THE DISTAL ANTERIOR WALL, DISTAL INFERIOR WALL AND AKINESIA OF THE APEX. THE BASAL SEGMENTS CONTRACT WELL AND OVERALL EF 35%  . Coronary angioplasty  1994    TO THE LAD  . Foot surgery Right     remote  . Cholecystectomy  2/11  . Icd implant  03/30/11    Analyze ST study patient    History  Smoking status  . Former Smoker -- 1.00 packs/day for 35 years  . Types: Cigarettes   . Quit date: 08/01/1992  Smokeless tobacco  . Never Used    History  Alcohol Use  . 0.0 oz/week  . Marland Kitchen25 drink(s) per week    Comment: 1 beer every 3-4 months     Family History  Problem Relation Age of Onset  . Stroke Father     Review of Systems: As noted in history of present illness. All other systems were reviewed and are negative.  Physical Exam: BP 106/52 mmHg  Pulse 67  Ht 5\' 8"  (1.727 m)  Wt 193 lb 4.8 oz (87.68 kg)  BMI 29.40 kg/m2 weight is up 7 lbs. Patient is very pleasant and in no acute distress. Skin is warm and dry. Color is normal.  HEENT is unremarkable. Normocephalic/atraumatic. PERRL. Sclera are nonicteric. Neck is supple. No masses. No JVD. Lungs are clear. Cardiac exam shows a regular rate and rhythm. No gallop or murmur. Abdomen is soft and obese. No masses or bruits . Extremities are without edema.  pedal pulses are good. Gait and ROM are intact. No gross neurologic deficits noted.   LABORATORY DATA: Ecg: NSR with LAD, old septal infarct. I have personally reviewed and interpreted this study.   Assessment / Plan: 1. Coronary disease with remote anterior myocardial infarction in 1994. Cardiac catheterization 2007 showed nonobstructive disease. His last stress test in October 2011 showed no ischemia. We will continue with his current medical therapy including aspirin and isosorbide. He has a history of intolerance to carvedilol because of dizziness. I will followup again in 6 months.  2. Ischemic cardiomyopathy with chronic systolic CHF. He is asymptomatic. He appears to be euvolemic. We'll continue with his current diuretic dose and ARB.   3. Prophylactic ICD. ICD check in November showed no arrhythmia.   4. Hyperlipidemia.   5. Chronic kidney disease stage III.   6. Severe COPD  7. DM type 2  The last lab work I have on file was in February 2015. I encouraged him to follow up with Dr. Darron Doom for complete labs. We will continue current therapy  and follow up in 6 months.

## 2014-07-18 ENCOUNTER — Other Ambulatory Visit: Payer: Self-pay | Admitting: Cardiology

## 2014-07-18 ENCOUNTER — Other Ambulatory Visit: Payer: Self-pay | Admitting: Gastroenterology

## 2014-08-08 DIAGNOSIS — M2011 Hallux valgus (acquired), right foot: Secondary | ICD-10-CM | POA: Diagnosis not present

## 2014-08-08 DIAGNOSIS — M79671 Pain in right foot: Secondary | ICD-10-CM | POA: Diagnosis not present

## 2014-08-08 DIAGNOSIS — L97519 Non-pressure chronic ulcer of other part of right foot with unspecified severity: Secondary | ICD-10-CM | POA: Diagnosis not present

## 2014-08-11 ENCOUNTER — Encounter: Payer: Self-pay | Admitting: Pulmonary Disease

## 2014-08-11 ENCOUNTER — Ambulatory Visit (INDEPENDENT_AMBULATORY_CARE_PROVIDER_SITE_OTHER): Payer: Medicare Other | Admitting: Pulmonary Disease

## 2014-08-11 VITALS — BP 126/66 | HR 79 | Ht 70.0 in | Wt 192.0 lb

## 2014-08-11 DIAGNOSIS — J449 Chronic obstructive pulmonary disease, unspecified: Secondary | ICD-10-CM | POA: Diagnosis not present

## 2014-08-11 MED ORDER — TIOTROPIUM BROMIDE MONOHYDRATE 18 MCG IN CAPS: 18.0000 ug | ORAL_CAPSULE | Freq: Every day | RESPIRATORY_TRACT | Status: DC

## 2014-08-11 NOTE — Assessment & Plan Note (Signed)
This has been a stable interval for Antonio Ray. His COPD he has not been an exacerbation since the last visit. Despite severe airflow obstruction he remains quite active.  Flu shot is up to date  Plan: -Continue Symbicort twice a day -Continue Spiriva, he requests that we change it to the handi-haler delivery device which we will accommodate -Follow-up 6 months

## 2014-08-11 NOTE — Patient Instructions (Signed)
Change to the Spiriva handihaler once daily Keep taking the Symbicort Get the Prevnar vaccine with your primary care doctor We will see you back in 6 months or sooner if needed

## 2014-08-11 NOTE — Progress Notes (Signed)
Subjective:    Patient ID: Antonio Ray, male    DOB: 1934-01-21, 81 y.o.   MRN: [id number]   synopsis: First the [facility] pulmonary clinic in 2015 for severe COPD. His FEV1 at that time was 29% predicted. He also has a scar in his right lung base believed to be related to pneumonia.  HPI  Chief Complaint  Patient presents with  . Follow-up    Pt states his breathing is at baseline: no complaints today.    Antonio Ray has been stable since the last visit.  He has been not been exercising as much due to the rain and bad weather and a recent diabetic foot infection.  He notes some shortness of breath with strenuous activity but it is not slowing him down at all.  He has cataract surgery planned for tomorrow morning.  He and his son are considering joining a gym in the next few weeks.   Past Medical History  Diagnosis Date  . GERD (gastroesophageal reflux disease)   . Coronary artery disease     History of remote anterior MI with PCI to LAD in 2006; most recent cath 2007, no intervention required  . MI, old 2007    ANTERIOR  . Chronic systolic dysfunction of left ventricle     EF 30-35%, CLASS II - III SYMPTOMS; intolerant to Coreg  . Hyperlipidemia   . PVC's (premature ventricular contractions)   . Diabetes mellitus   . DOE (dyspnea on exertion)   . ICD (implantable cardiac defibrillator) in place August 2012  . Intolerance, drug     Intolerant to beta blockers  . Myocardial infarction   . Pneumonia   . CRF (chronic renal failure)   . BPH (benign prostatic hyperplasia)   . CKD (chronic kidney disease) stage 3, GFR 30-59 ml/min 06/08/2012  . CHF (congestive heart failure)   . COPD (chronic obstructive pulmonary disease)      Review of Systems     Objective:   Physical Exam  Filed Vitals:   08/11/14 1453  BP: 126/66  Pulse: 79  Height: 5\' 10"  (1.778 m)  Weight: 192 lb (87.091 kg)  SpO2: 97%  Ra  Gen: well appearing, no acute distress HEENT: NCAT, EOMi, OP  clear PULM: Few wheezes and crackles in R lung base, otherwise clear CV: RRR, no mgr, no JVD AB: BS+, soft, nontender, no hsm Ext: warm, no edema, no clubbing, no cyanosis Derm: no rash or skin breakdown  01/03/2014 CXR > improved aeration R lung base, scarring still noted 12/2013 CT chest > emphysema; RML linear scar/atelectasis     Assessment & Plan:   COPD, severe This has been a stable interval for Antonio Ray. His COPD he has not been an exacerbation since the last visit. Despite severe airflow obstruction he remains quite active.  Flu shot is up to date  Plan: -Continue Symbicort twice a day -Continue Spiriva, he requests that we change it to the handi-haler delivery device which we will accommodate -Follow-up 6 months    Updated Medication List Outpatient Encounter Prescriptions as of 08/11/2014  Medication Sig  . aspirin 81 MG EC tablet Take 81 mg by mouth daily.    . budesonide-formoterol (SYMBICORT) 160-4.5 MCG/ACT inhaler Inhale 2 puffs into the lungs 2 (two) times daily.  . Cholecalciferol (VITAMIN D PO) Take 2,000 Units by mouth daily.   . fenofibrate (TRICOR) 145 MG tablet TAKE 1 TABLET BY MOUTH EVERY DAY  . furosemide (LASIX) 20 MG tablet  TAKE 1 TABLET BY MOUTH DAILY.  Marland Kitchen insulin glargine (LANTUS) 100 UNIT/ML injection Inject 22 Units into the skin daily.   Marland Kitchen losartan (COZAAR) 50 MG tablet Take 25 mg by mouth daily.  . nitroGLYCERIN (NITRODUR - DOSED IN MG/24 HR) 0.2 mg/hr patch PLACE 1 PATCH ONTO THE SKIN DAILY  . pantoprazole (PROTONIX) 20 MG tablet TAKE 1 TABLET BY MOUTH EVERY DAY  . pravastatin (PRAVACHOL) 40 MG tablet TAKE 1 TABLET BY MOUTH EVERY EVENING  . PROVENTIL HFA 108 (90 BASE) MCG/ACT inhaler As directed  . [DISCONTINUED] Tiotropium Bromide Monohydrate (SPIRIVA RESPIMAT) 2.5 MCG/ACT AERS Inhale 2 puffs into the lungs daily.  Marland Kitchen tiotropium (SPIRIVA) 18 MCG inhalation capsule Place 1 capsule (18 mcg total) into inhaler and inhale daily.

## 2014-08-12 DIAGNOSIS — H25012 Cortical age-related cataract, left eye: Secondary | ICD-10-CM | POA: Diagnosis not present

## 2014-08-12 DIAGNOSIS — H2512 Age-related nuclear cataract, left eye: Secondary | ICD-10-CM | POA: Diagnosis not present

## 2014-08-14 DIAGNOSIS — M7989 Other specified soft tissue disorders: Secondary | ICD-10-CM | POA: Diagnosis not present

## 2014-08-14 DIAGNOSIS — M2040 Other hammer toe(s) (acquired), unspecified foot: Secondary | ICD-10-CM | POA: Diagnosis not present

## 2014-08-14 DIAGNOSIS — E1051 Type 1 diabetes mellitus with diabetic peripheral angiopathy without gangrene: Secondary | ICD-10-CM | POA: Diagnosis not present

## 2014-08-14 DIAGNOSIS — L97519 Non-pressure chronic ulcer of other part of right foot with unspecified severity: Secondary | ICD-10-CM | POA: Diagnosis not present

## 2014-08-14 DIAGNOSIS — M19079 Primary osteoarthritis, unspecified ankle and foot: Secondary | ICD-10-CM | POA: Diagnosis not present

## 2014-08-14 DIAGNOSIS — L84 Corns and callosities: Secondary | ICD-10-CM | POA: Diagnosis not present

## 2014-08-14 DIAGNOSIS — L603 Nail dystrophy: Secondary | ICD-10-CM | POA: Diagnosis not present

## 2014-08-14 DIAGNOSIS — I739 Peripheral vascular disease, unspecified: Secondary | ICD-10-CM | POA: Diagnosis not present

## 2014-08-14 DIAGNOSIS — M2010 Hallux valgus (acquired), unspecified foot: Secondary | ICD-10-CM | POA: Diagnosis not present

## 2014-08-20 DIAGNOSIS — L97519 Non-pressure chronic ulcer of other part of right foot with unspecified severity: Secondary | ICD-10-CM | POA: Diagnosis not present

## 2014-08-29 ENCOUNTER — Other Ambulatory Visit: Payer: Self-pay | Admitting: Dermatology

## 2014-08-29 DIAGNOSIS — D225 Melanocytic nevi of trunk: Secondary | ICD-10-CM | POA: Diagnosis not present

## 2014-08-29 DIAGNOSIS — Z85828 Personal history of other malignant neoplasm of skin: Secondary | ICD-10-CM | POA: Diagnosis not present

## 2014-08-29 DIAGNOSIS — C760 Malignant neoplasm of head, face and neck: Secondary | ICD-10-CM | POA: Diagnosis not present

## 2014-08-29 DIAGNOSIS — L821 Other seborrheic keratosis: Secondary | ICD-10-CM | POA: Diagnosis not present

## 2014-08-29 DIAGNOSIS — L570 Actinic keratosis: Secondary | ICD-10-CM | POA: Diagnosis not present

## 2014-08-29 DIAGNOSIS — L119 Acantholytic disorder, unspecified: Secondary | ICD-10-CM | POA: Diagnosis not present

## 2014-08-29 DIAGNOSIS — D485 Neoplasm of uncertain behavior of skin: Secondary | ICD-10-CM | POA: Diagnosis not present

## 2014-09-03 ENCOUNTER — Encounter: Payer: Federal, State, Local not specified - PPO | Admitting: Internal Medicine

## 2014-09-04 ENCOUNTER — Telehealth: Payer: Self-pay | Admitting: Cardiology

## 2014-09-04 ENCOUNTER — Encounter: Payer: Self-pay | Admitting: *Deleted

## 2014-09-04 NOTE — Telephone Encounter (Signed)
Call pt son w/ future schedule changes at [phone].

## 2014-09-08 DIAGNOSIS — H25011 Cortical age-related cataract, right eye: Secondary | ICD-10-CM | POA: Diagnosis not present

## 2014-09-08 DIAGNOSIS — H2511 Age-related nuclear cataract, right eye: Secondary | ICD-10-CM | POA: Diagnosis not present

## 2014-09-16 ENCOUNTER — Encounter: Payer: Self-pay | Admitting: Internal Medicine

## 2014-09-18 ENCOUNTER — Other Ambulatory Visit: Payer: Self-pay | Admitting: Cardiology

## 2014-09-19 ENCOUNTER — Telehealth: Payer: Self-pay | Admitting: Pulmonary Disease

## 2014-09-19 MED ORDER — BUDESONIDE-FORMOTEROL FUMARATE 160-4.5 MCG/ACT IN AERO: 2.0000 | INHALATION_SPRAY | Freq: Two times a day (BID) | RESPIRATORY_TRACT | Status: DC

## 2014-09-19 NOTE — Telephone Encounter (Signed)
Rx refill sent to patient pharmacy  No answer from [facility] but spoke with patient to let him know. Michela Pitcher he will try to get in touch with son to let him know Rx was ready.

## 2014-09-19 NOTE — Telephone Encounter (Signed)
°  1. Which medications need to be refilled? Furosemide  2. Which pharmacy is medication to be sent to?[facility] on [address]   3. Do they need a 30 day or 90 day supply? 90  4. Would they like a call back once the medication has been sent to the pharmacy? Yes because he is currently out

## 2014-09-19 NOTE — Telephone Encounter (Signed)
Called and spoke to pt. Pt needing Symbicort refill. Rx sent to preferred pharmacy. Pt verbalized understanding and denied any further questions or concerns at this time.

## 2014-09-23 ENCOUNTER — Other Ambulatory Visit: Payer: Self-pay

## 2014-09-25 ENCOUNTER — Other Ambulatory Visit: Payer: Self-pay

## 2014-09-25 ENCOUNTER — Encounter: Payer: Self-pay | Admitting: Internal Medicine

## 2014-09-25 ENCOUNTER — Ambulatory Visit (INDEPENDENT_AMBULATORY_CARE_PROVIDER_SITE_OTHER): Payer: Medicare Other | Admitting: Internal Medicine

## 2014-09-25 VITALS — BP 104/54 | HR 75 | Ht 70.0 in | Wt 192.4 lb

## 2014-09-25 DIAGNOSIS — I495 Sick sinus syndrome: Secondary | ICD-10-CM

## 2014-09-25 DIAGNOSIS — Z95810 Presence of automatic (implantable) cardiac defibrillator: Secondary | ICD-10-CM

## 2014-09-25 DIAGNOSIS — I5022 Chronic systolic (congestive) heart failure: Secondary | ICD-10-CM

## 2014-09-25 DIAGNOSIS — L97519 Non-pressure chronic ulcer of other part of right foot with unspecified severity: Secondary | ICD-10-CM | POA: Diagnosis not present

## 2014-09-25 LAB — MDC_IDC_ENUM_SESS_TYPE_INCLINIC
Battery Remaining Longevity: 66 mo
Brady Statistic RV Percent Paced: 0.54 %
HIGH POWER IMPEDANCE MEASURED VALUE: 83.25 Ohm
Lead Channel Impedance Value: 425 Ohm
Lead Channel Impedance Value: 437.5 Ohm
Lead Channel Pacing Threshold Amplitude: 0.5 V
Lead Channel Pacing Threshold Amplitude: 0.75 V
Lead Channel Pacing Threshold Pulse Width: 0.5 ms
Lead Channel Sensing Intrinsic Amplitude: 12 mV
Lead Channel Setting Pacing Amplitude: 2 V
Lead Channel Setting Pacing Pulse Width: 0.5 ms
Lead Channel Setting Sensing Sensitivity: 0.5 mV
MDC IDC MSMT LEADCHNL RA SENSING INTR AMPL: 1.6 mV
MDC IDC MSMT LEADCHNL RV PACING THRESHOLD PULSEWIDTH: 0.5 ms
MDC IDC PG SERIAL: 828604
MDC IDC SESS DTM: 20160225143658
MDC IDC SET LEADCHNL RV PACING AMPLITUDE: 2.5 V
MDC IDC STAT BRADY RA PERCENT PACED: 42 %
Zone Setting Detection Interval: 280 ms

## 2014-09-25 NOTE — Progress Notes (Signed)
PCP: Hayden Rasmussen., MD Primary Cardiologist:  Dr [place]  Antonio Ray is a 81 y.o. male who presents today for routine electrophysiology followup.  Since last being seen in our clinic, the patient reports doing very well.  Today, he denies symptoms of palpitations, chest pain, shortness of breath,  lower extremity edema, presyncope, syncope, or ICD shocks.  The patient is otherwise without complaint today.   Past Medical History  Diagnosis Date  . GERD (gastroesophageal reflux disease)   . Coronary artery disease     History of remote anterior MI with PCI to LAD in 2006; most recent cath 2007, no intervention required  . MI, old 2007    ANTERIOR  . Chronic systolic dysfunction of left ventricle     EF 30-35%, CLASS II - III SYMPTOMS; intolerant to Coreg  . Hyperlipidemia   . PVC's (premature ventricular contractions)   . Diabetes mellitus   . DOE (dyspnea on exertion)   . ICD (implantable cardiac defibrillator) in place August 2012  . Intolerance, drug     Intolerant to beta blockers  . Myocardial infarction   . Pneumonia   . CRF (chronic renal failure)   . BPH (benign prostatic hyperplasia)   . CKD (chronic kidney disease) stage 3, GFR 30-59 ml/min 06/08/2012  . CHF (congestive heart failure)   . COPD (chronic obstructive pulmonary disease)    Past Surgical History  Procedure Laterality Date  . Cardiac catheterization  01/11/2006    DEMONSTRATES AKINESIA OF THE DISTAL ANTERIOR WALL, DISTAL INFERIOR WALL AND AKINESIA OF THE APEX. THE BASAL SEGMENTS CONTRACT WELL AND OVERALL EF 35%  . Coronary angioplasty  1994    TO THE LAD  . Foot surgery Right     remote  . Cholecystectomy  2/11  . Icd implant  03/30/11    Analyze ST study patient    Current Outpatient Prescriptions  Medication Sig Dispense Refill  . aspirin 81 MG EC tablet Take 81 mg by mouth daily.      . budesonide-formoterol (SYMBICORT) 160-4.5 MCG/ACT inhaler Inhale 2 puffs into the lungs 2 (two) times daily. 1  Inhaler 12  . Cholecalciferol (VITAMIN D PO) Take 2,000 Units by mouth daily.     . fenofibrate (TRICOR) 145 MG tablet TAKE 1 TABLET BY MOUTH EVERY DAY 90 tablet 1  . furosemide (LASIX) 20 MG tablet TAKE 1 TABLET BY MOUTH EVERY DAY 90 tablet 2  . insulin glargine (LANTUS) 100 UNIT/ML injection Inject 24 Units into the skin daily.     Marland Kitchen losartan (COZAAR) 50 MG tablet Take 25 mg by mouth daily.    . nitroGLYCERIN (NITRODUR - DOSED IN MG/24 HR) 0.2 mg/hr patch PLACE 1 PATCH ONTO THE SKIN DAILY 90 patch 4  . pantoprazole (PROTONIX) 20 MG tablet TAKE 1 TABLET BY MOUTH EVERY DAY 90 tablet 0  . pravastatin (PRAVACHOL) 40 MG tablet TAKE 1 TABLET BY MOUTH EVERY EVENING 90 tablet 1  . tiotropium (SPIRIVA) 18 MCG inhalation capsule Place 1 capsule (18 mcg total) into inhaler and inhale daily. 30 capsule 6  . PROVENTIL HFA 108 (90 BASE) MCG/ACT inhaler Inhale 2 puffs into the lungs every 6 (six) hours as needed for wheezing or shortness of breath. As directed     No current facility-administered medications for this visit.    Physical Exam: Filed Vitals:   09/25/14 1353  BP: 104/54  Pulse: 75  Height: 5\' 10"  (1.778 m)  Weight: 192 lb 6.4 oz (87.272 kg)  GEN- The patient is well appearing, alert and oriented x 3 today.   Head- normocephalic, atraumatic Eyes-  Sclera clear, conjunctiva pink Ears- hearing intact Oropharynx- clear Lungs- Clear to ausculation bilaterally, normal work of breathing Chest- ICD pocket is well healed Heart- Regular rate and rhythm, no murmurs, rubs or gallops, PMI not laterally displaced GI- soft, NT, ND, + BS Extremities- no clubbing, cyanosis, or edema  ICD interrogation- reviewed in detail today,  See PACEART report  ekg today reveals sinus with demand atrial pacing, anterior infarction with lateral twi  Assessment and Plan:   1. Chronic systolic dysfunction Stable No change required today Normal ICD function See Pace Art report No changes today  2.  CAD No ischemic symptoms  Merlin Return to see EP device nurse I will see again in 12 months

## 2014-09-25 NOTE — Patient Instructions (Signed)
Your physician wants you to follow-up in: 6 months with [facility], RN device clinic and 12 months with Dr Vallery Ridge will receive a reminder letter in the mail two months in advance. If you don't receive a letter, please call our office to schedule the follow-up appointment.  Remote monitoring is used to monitor your Pacemaker or ICD from home. This monitoring reduces the number of office visits required to check your device to one time per year. It allows [place] to keep an eye on the functioning of your device to ensure it is working properly. You are scheduled for a device check from home on 12/25/14. You may send your transmission at any time that day. If you have a wireless device, the transmission will be sent automatically. After your physician reviews your transmission, you will receive a postcard with your next transmission date.

## 2014-09-29 ENCOUNTER — Encounter: Payer: Self-pay | Admitting: Internal Medicine

## 2014-10-02 DIAGNOSIS — C4442 Squamous cell carcinoma of skin of scalp and neck: Secondary | ICD-10-CM | POA: Diagnosis not present

## 2014-10-06 ENCOUNTER — Encounter: Payer: Medicare Other | Admitting: Internal Medicine

## 2014-10-07 DIAGNOSIS — H25011 Cortical age-related cataract, right eye: Secondary | ICD-10-CM | POA: Diagnosis not present

## 2014-10-07 DIAGNOSIS — H2511 Age-related nuclear cataract, right eye: Secondary | ICD-10-CM | POA: Diagnosis not present

## 2014-10-07 DIAGNOSIS — H5201 Hypermetropia, right eye: Secondary | ICD-10-CM | POA: Diagnosis not present

## 2014-10-07 DIAGNOSIS — H5212 Myopia, left eye: Secondary | ICD-10-CM | POA: Diagnosis not present

## 2014-10-07 DIAGNOSIS — H52223 Regular astigmatism, bilateral: Secondary | ICD-10-CM | POA: Diagnosis not present

## 2014-10-08 DIAGNOSIS — N189 Chronic kidney disease, unspecified: Secondary | ICD-10-CM | POA: Diagnosis not present

## 2014-10-08 DIAGNOSIS — E1142 Type 2 diabetes mellitus with diabetic polyneuropathy: Secondary | ICD-10-CM | POA: Diagnosis not present

## 2014-10-08 DIAGNOSIS — N183 Chronic kidney disease, stage 3 (moderate): Secondary | ICD-10-CM | POA: Diagnosis not present

## 2014-10-20 ENCOUNTER — Encounter: Payer: Medicare Other | Admitting: Internal Medicine

## 2014-10-23 DIAGNOSIS — E1151 Type 2 diabetes mellitus with diabetic peripheral angiopathy without gangrene: Secondary | ICD-10-CM | POA: Diagnosis not present

## 2014-10-23 DIAGNOSIS — I739 Peripheral vascular disease, unspecified: Secondary | ICD-10-CM | POA: Diagnosis not present

## 2014-10-23 DIAGNOSIS — L84 Corns and callosities: Secondary | ICD-10-CM | POA: Diagnosis not present

## 2014-10-23 DIAGNOSIS — L603 Nail dystrophy: Secondary | ICD-10-CM | POA: Diagnosis not present

## 2014-10-27 ENCOUNTER — Encounter: Payer: Medicare Other | Admitting: Internal Medicine

## 2014-11-18 DIAGNOSIS — R413 Other amnesia: Secondary | ICD-10-CM | POA: Diagnosis not present

## 2014-11-18 DIAGNOSIS — I5022 Chronic systolic (congestive) heart failure: Secondary | ICD-10-CM | POA: Diagnosis not present

## 2014-11-18 DIAGNOSIS — N183 Chronic kidney disease, stage 3 (moderate): Secondary | ICD-10-CM | POA: Diagnosis not present

## 2014-11-24 DIAGNOSIS — R413 Other amnesia: Secondary | ICD-10-CM | POA: Diagnosis not present

## 2014-12-12 DIAGNOSIS — N189 Chronic kidney disease, unspecified: Secondary | ICD-10-CM | POA: Diagnosis not present

## 2014-12-12 DIAGNOSIS — F0390 Unspecified dementia without behavioral disturbance: Secondary | ICD-10-CM | POA: Diagnosis not present

## 2014-12-25 ENCOUNTER — Ambulatory Visit (INDEPENDENT_AMBULATORY_CARE_PROVIDER_SITE_OTHER): Payer: Medicare Other | Admitting: *Deleted

## 2014-12-25 DIAGNOSIS — I255 Ischemic cardiomyopathy: Secondary | ICD-10-CM | POA: Diagnosis not present

## 2014-12-25 NOTE — Progress Notes (Signed)
Remote ICD transmission.   

## 2014-12-26 LAB — CUP PACEART REMOTE DEVICE CHECK
Lead Channel Pacing Threshold Pulse Width: 0.5 ms
Lead Channel Sensing Intrinsic Amplitude: 12 mV
MDC IDC MSMT LEADCHNL RA PACING THRESHOLD AMPLITUDE: 0.75 V
MDC IDC MSMT LEADCHNL RA SENSING INTR AMPL: 1.2 mV
MDC IDC SESS DTM: 20160527134956
Pulse Gen Serial Number: 828604

## 2015-01-05 DIAGNOSIS — I739 Peripheral vascular disease, unspecified: Secondary | ICD-10-CM | POA: Diagnosis not present

## 2015-01-05 DIAGNOSIS — L84 Corns and callosities: Secondary | ICD-10-CM | POA: Diagnosis not present

## 2015-01-05 DIAGNOSIS — L603 Nail dystrophy: Secondary | ICD-10-CM | POA: Diagnosis not present

## 2015-01-05 DIAGNOSIS — E1151 Type 2 diabetes mellitus with diabetic peripheral angiopathy without gangrene: Secondary | ICD-10-CM | POA: Diagnosis not present

## 2015-01-07 ENCOUNTER — Encounter: Payer: Self-pay | Admitting: Cardiology

## 2015-01-13 ENCOUNTER — Encounter: Payer: Self-pay | Admitting: Internal Medicine

## 2015-01-13 ENCOUNTER — Inpatient Hospital Stay (HOSPITAL_COMMUNITY)
Admission: EM | Admit: 2015-01-13 | Discharge: 2015-01-15 | DRG: 190 | Disposition: A | Discharge status: Home / Self Care | Payer: Medicare Other | Attending: Internal Medicine | Admitting: Internal Medicine

## 2015-01-13 ENCOUNTER — Emergency Department (HOSPITAL_COMMUNITY): Payer: Medicare Other

## 2015-01-13 ENCOUNTER — Encounter (HOSPITAL_COMMUNITY): Payer: Self-pay | Admitting: Emergency Medicine

## 2015-01-13 DIAGNOSIS — Z7982 Long term (current) use of aspirin: Secondary | ICD-10-CM

## 2015-01-13 DIAGNOSIS — E785 Hyperlipidemia, unspecified: Secondary | ICD-10-CM | POA: Diagnosis present

## 2015-01-13 DIAGNOSIS — N183 Chronic kidney disease, stage 3 unspecified: Secondary | ICD-10-CM | POA: Diagnosis present

## 2015-01-13 DIAGNOSIS — I253 Aneurysm of heart: Secondary | ICD-10-CM | POA: Diagnosis present

## 2015-01-13 DIAGNOSIS — E1122 Type 2 diabetes mellitus with diabetic chronic kidney disease: Secondary | ICD-10-CM | POA: Diagnosis present

## 2015-01-13 DIAGNOSIS — I2510 Atherosclerotic heart disease of native coronary artery without angina pectoris: Secondary | ICD-10-CM | POA: Diagnosis not present

## 2015-01-13 DIAGNOSIS — E119 Type 2 diabetes mellitus without complications: Secondary | ICD-10-CM | POA: Diagnosis not present

## 2015-01-13 DIAGNOSIS — J440 Chronic obstructive pulmonary disease with acute lower respiratory infection: Secondary | ICD-10-CM | POA: Diagnosis present

## 2015-01-13 DIAGNOSIS — N400 Enlarged prostate without lower urinary tract symptoms: Secondary | ICD-10-CM | POA: Diagnosis present

## 2015-01-13 DIAGNOSIS — Z95810 Presence of automatic (implantable) cardiac defibrillator: Secondary | ICD-10-CM | POA: Diagnosis present

## 2015-01-13 DIAGNOSIS — Z794 Long term (current) use of insulin: Secondary | ICD-10-CM | POA: Diagnosis not present

## 2015-01-13 DIAGNOSIS — J209 Acute bronchitis, unspecified: Secondary | ICD-10-CM | POA: Diagnosis present

## 2015-01-13 DIAGNOSIS — J9602 Acute respiratory failure with hypercapnia: Secondary | ICD-10-CM | POA: Diagnosis present

## 2015-01-13 DIAGNOSIS — Z79899 Other long term (current) drug therapy: Secondary | ICD-10-CM | POA: Diagnosis not present

## 2015-01-13 DIAGNOSIS — J9601 Acute respiratory failure with hypoxia: Secondary | ICD-10-CM | POA: Diagnosis present

## 2015-01-13 DIAGNOSIS — Z888 Allergy status to other drugs, medicaments and biological substances status: Secondary | ICD-10-CM

## 2015-01-13 DIAGNOSIS — I252 Old myocardial infarction: Secondary | ICD-10-CM

## 2015-01-13 DIAGNOSIS — R0602 Shortness of breath: Secondary | ICD-10-CM

## 2015-01-13 DIAGNOSIS — Z87891 Personal history of nicotine dependence: Secondary | ICD-10-CM

## 2015-01-13 DIAGNOSIS — Z9049 Acquired absence of other specified parts of digestive tract: Secondary | ICD-10-CM | POA: Diagnosis present

## 2015-01-13 DIAGNOSIS — K219 Gastro-esophageal reflux disease without esophagitis: Secondary | ICD-10-CM | POA: Diagnosis not present

## 2015-01-13 DIAGNOSIS — Z881 Allergy status to other antibiotic agents status: Secondary | ICD-10-CM

## 2015-01-13 DIAGNOSIS — J441 Chronic obstructive pulmonary disease with (acute) exacerbation: Secondary | ICD-10-CM | POA: Diagnosis present

## 2015-01-13 DIAGNOSIS — R05 Cough: Secondary | ICD-10-CM | POA: Diagnosis not present

## 2015-01-13 DIAGNOSIS — E669 Obesity, unspecified: Secondary | ICD-10-CM | POA: Diagnosis present

## 2015-01-13 DIAGNOSIS — I255 Ischemic cardiomyopathy: Secondary | ICD-10-CM | POA: Diagnosis present

## 2015-01-13 DIAGNOSIS — I5023 Acute on chronic systolic (congestive) heart failure: Secondary | ICD-10-CM | POA: Diagnosis not present

## 2015-01-13 DIAGNOSIS — N179 Acute kidney failure, unspecified: Secondary | ICD-10-CM

## 2015-01-13 DIAGNOSIS — I5022 Chronic systolic (congestive) heart failure: Secondary | ICD-10-CM | POA: Diagnosis present

## 2015-01-13 DIAGNOSIS — J438 Other emphysema: Secondary | ICD-10-CM | POA: Diagnosis not present

## 2015-01-13 DIAGNOSIS — I213 ST elevation (STEMI) myocardial infarction of unspecified site: Secondary | ICD-10-CM | POA: Diagnosis not present

## 2015-01-13 DIAGNOSIS — R079 Chest pain, unspecified: Secondary | ICD-10-CM

## 2015-01-13 DIAGNOSIS — J189 Pneumonia, unspecified organism: Secondary | ICD-10-CM | POA: Diagnosis present

## 2015-01-13 DIAGNOSIS — J984 Other disorders of lung: Secondary | ICD-10-CM | POA: Diagnosis not present

## 2015-01-13 DIAGNOSIS — R069 Unspecified abnormalities of breathing: Secondary | ICD-10-CM | POA: Diagnosis not present

## 2015-01-13 DIAGNOSIS — Z6830 Body mass index (BMI) 30.0-30.9, adult: Secondary | ICD-10-CM

## 2015-01-13 DIAGNOSIS — I493 Ventricular premature depolarization: Secondary | ICD-10-CM | POA: Diagnosis present

## 2015-01-13 DIAGNOSIS — I513 Intracardiac thrombosis, not elsewhere classified: Secondary | ICD-10-CM | POA: Diagnosis present

## 2015-01-13 DIAGNOSIS — Z882 Allergy status to sulfonamides status: Secondary | ICD-10-CM | POA: Diagnosis not present

## 2015-01-13 DIAGNOSIS — Z885 Allergy status to narcotic agent status: Secondary | ICD-10-CM

## 2015-01-13 DIAGNOSIS — R0789 Other chest pain: Secondary | ICD-10-CM | POA: Diagnosis not present

## 2015-01-13 DIAGNOSIS — E872 Acidosis: Secondary | ICD-10-CM | POA: Diagnosis present

## 2015-01-13 DIAGNOSIS — Z9861 Coronary angioplasty status: Secondary | ICD-10-CM

## 2015-01-13 DIAGNOSIS — Z6826 Body mass index (BMI) 26.0-26.9, adult: Secondary | ICD-10-CM | POA: Diagnosis not present

## 2015-01-13 DIAGNOSIS — I509 Heart failure, unspecified: Secondary | ICD-10-CM | POA: Diagnosis not present

## 2015-01-13 HISTORY — DX: Cardiac murmur, unspecified: R01.1

## 2015-01-13 HISTORY — DX: Presence of automatic (implantable) cardiac defibrillator: Z95.810

## 2015-01-13 HISTORY — DX: Unspecified malignant neoplasm of skin, unspecified: C44.90

## 2015-01-13 HISTORY — DX: Type 2 diabetes mellitus without complications: E11.9

## 2015-01-13 LAB — CBC WITH DIFFERENTIAL/PLATELET
BASOS PCT: 0 % (ref 0–1)
Basophils Absolute: 0 10*3/uL (ref 0.0–0.1)
EOS ABS: 0.3 10*3/uL (ref 0.0–0.7)
Eosinophils Relative: 4 % (ref 0–5)
HCT: 39.9 % (ref 39.0–52.0)
Hemoglobin: 12.6 g/dL — ABNORMAL LOW (ref 13.0–17.0)
Lymphocytes Relative: 18 % (ref 12–46)
Lymphs Abs: 1.3 10*3/uL (ref 0.7–4.0)
MCH: 28.1 pg (ref 26.0–34.0)
MCHC: 31.6 g/dL (ref 30.0–36.0)
MCV: 89.1 fL (ref 78.0–100.0)
Monocytes Absolute: 0.8 10*3/uL (ref 0.1–1.0)
Monocytes Relative: 11 % (ref 3–12)
NEUTROS PCT: 67 % (ref 43–77)
Neutro Abs: 5.1 10*3/uL (ref 1.7–7.7)
PLATELETS: 245 10*3/uL (ref 150–400)
RBC: 4.48 MIL/uL (ref 4.22–5.81)
RDW: 13.9 % (ref 11.5–15.5)
WBC: 7.5 10*3/uL (ref 4.0–10.5)

## 2015-01-13 LAB — COMPREHENSIVE METABOLIC PANEL
ALBUMIN: 3.4 g/dL — AB (ref 3.5–5.0)
ALK PHOS: 37 U/L — AB (ref 38–126)
ALT: 25 U/L (ref 17–63)
AST: 27 U/L (ref 15–41)
Anion gap: 8 (ref 5–15)
BUN: 31 mg/dL — ABNORMAL HIGH (ref 6–20)
CALCIUM: 9.3 mg/dL (ref 8.9–10.3)
CO2: 26 mmol/L (ref 22–32)
Chloride: 104 mmol/L (ref 101–111)
Creatinine, Ser: 1.7 mg/dL — ABNORMAL HIGH (ref 0.61–1.24)
GFR calc Af Amer: 42 mL/min — ABNORMAL LOW (ref >60)
GFR calc non Af Amer: 37 mL/min — ABNORMAL LOW (ref >60)
Glucose, Bld: 96 mg/dL (ref 65–99)
Potassium: 4 mmol/L (ref 3.5–5.1)
SODIUM: 138 mmol/L (ref 135–145)
TOTAL PROTEIN: 6.5 g/dL (ref 6.5–8.1)
Total Bilirubin: 0.5 mg/dL (ref 0.3–1.2)

## 2015-01-13 LAB — I-STAT CG4 LACTIC ACID, ED: Lactic Acid, Venous: 0.7 mmol/L (ref 0.5–2.0)

## 2015-01-13 LAB — BRAIN NATRIURETIC PEPTIDE: B Natriuretic Peptide: 176.5 pg/mL — ABNORMAL HIGH (ref 0.0–100.0)

## 2015-01-13 MED ORDER — IPRATROPIUM-ALBUTEROL 0.5-2.5 (3) MG/3ML IN SOLN: 3.0000 mL | Freq: Once | RESPIRATORY_TRACT | Status: DC

## 2015-01-13 MED ORDER — IPRATROPIUM-ALBUTEROL 0.5-2.5 (3) MG/3ML IN SOLN
3.0000 mL | Freq: Once | RESPIRATORY_TRACT | Status: AC
  Administered 2015-01-13: 3 mL via RESPIRATORY_TRACT
  Filled 2015-01-13: qty 3

## 2015-01-13 MED ORDER — IPRATROPIUM-ALBUTEROL 0.5-2.5 (3) MG/3ML IN SOLN
3.0000 mL | Freq: Once | RESPIRATORY_TRACT | Status: AC
  Administered 2015-01-14: 3 mL via RESPIRATORY_TRACT
  Filled 2015-01-13 (×2): qty 3

## 2015-01-13 MED ORDER — METHYLPREDNISOLONE SODIUM SUCC 125 MG IJ SOLR
125.0000 mg | Freq: Once | INTRAMUSCULAR | Status: AC
  Administered 2015-01-14: 125 mg via INTRAVENOUS
  Filled 2015-01-13: qty 2

## 2015-01-13 MED ORDER — SODIUM CHLORIDE 0.9 % IV BOLUS (SEPSIS)
500.0000 mL | Freq: Once | INTRAVENOUS | Status: AC
  Administered 2015-01-13: 500 mL via INTRAVENOUS

## 2015-01-13 NOTE — ED Provider Notes (Signed)
CSN: [id number]     Arrival date & time 01/13/15  2023 History   First MD Initiated Contact with Patient 01/13/15 2027     Chief Complaint  Patient presents with  . Cough     (Consider location/radiation/quality/duration/timing/severity/associated sxs/prior Treatment) The history is provided by the patient.  Antonio Ray is a 81 y.o. male hx of GERD, MI, CAD, CHF with pacemaker here with cough, shortness of breath. Patient has been having nonproductive cough for the last day or so. Also has some vague chest pressure as well. Denies any fevers or chills. Has some worsening shortness of breath with exertion. Denies any worsening legs swelling. He went to urgent care and had a normal chest x-ray. He was given albuterol neb with no relief. Sent here for further evaluation.   Past Medical History  Diagnosis Date  . GERD (gastroesophageal reflux disease)   . Coronary artery disease     History of remote anterior MI with PCI to LAD in 2006; most recent cath 2007, no intervention required  . MI, old 2007    ANTERIOR  . Chronic systolic dysfunction of left ventricle     EF 30-35%, CLASS II - III SYMPTOMS; intolerant to Coreg  . Hyperlipidemia   . PVC's (premature ventricular contractions)   . Diabetes mellitus   . DOE (dyspnea on exertion)   . ICD (implantable cardiac defibrillator) in place August 2012  . Intolerance, drug     Intolerant to beta blockers  . Myocardial infarction   . Pneumonia   . CRF (chronic renal failure)   . BPH (benign prostatic hyperplasia)   . CKD (chronic kidney disease) stage 3, GFR 30-59 ml/min 06/08/2012  . CHF (congestive heart failure)   . COPD (chronic obstructive pulmonary disease)    Past Surgical History  Procedure Laterality Date  . Cardiac catheterization  01/11/2006    DEMONSTRATES AKINESIA OF THE DISTAL ANTERIOR WALL, DISTAL INFERIOR WALL AND AKINESIA OF THE APEX. THE BASAL SEGMENTS CONTRACT WELL AND OVERALL EF 35%  . Coronary angioplasty  1994     TO THE LAD  . Foot surgery Right     remote  . Cholecystectomy  2/11  . Icd implant  03/30/11    Analyze ST study patient   Family History  Problem Relation Age of Onset  . Stroke Father    History  Substance Use Topics  . Smoking status: Former Smoker -- 1.00 packs/day for 35 years    Types: Cigarettes    Quit date: 08/01/1992  . Smokeless tobacco: Never Used  . Alcohol Use: 0.0 oz/week    .25 drink(s) per week     Comment: 1 beer every 3-4 months     Review of Systems  Respiratory: Positive for cough and shortness of breath.   All other systems reviewed and are negative.     Allergies  Codeine; Dilaudid; Flomax; Morphine and related; Sulfa antibiotics; Carvedilol; and Levofloxacin  Home Medications   Prior to Admission medications   Medication Sig Start Date End Date Taking? Authorizing Provider  aspirin 81 MG EC tablet Take 81 mg by mouth daily.     Yes Historical Provider, MD  budesonide-formoterol (SYMBICORT) 160-4.5 MCG/ACT inhaler Inhale 2 puffs into the lungs 2 (two) times daily. 09/19/14  Yes Juanito Doom, MD  Cholecalciferol (VITAMIN D PO) Take 2,000 Units by mouth daily.    Yes Historical Provider, MD  donepezil (ARICEPT) 10 MG tablet Take 10 mg by mouth every morning.  Yes Historical Provider, MD  fenofibrate (TRICOR) 145 MG tablet TAKE 1 TABLET BY MOUTH EVERY DAY 07/18/14  Yes Peter M Martinique, MD  furosemide (LASIX) 20 MG tablet TAKE 1 TABLET BY MOUTH EVERY DAY 09/19/14  Yes Peter M Martinique, MD  insulin glargine (LANTUS) 100 UNIT/ML injection Inject 24 Units into the skin every morning.    Yes Historical Provider, MD  losartan (COZAAR) 50 MG tablet Take 25 mg by mouth daily. 05/17/13  Yes Rochelle, PA-C  nitroGLYCERIN (NITRODUR - DOSED IN MG/24 HR) 0.2 mg/hr patch PLACE 1 PATCH ONTO THE SKIN DAILY 04/02/14  Yes Peter M Martinique, MD  pantoprazole (PROTONIX) 20 MG tablet TAKE 1 TABLET BY MOUTH EVERY DAY 07/18/14  Yes Inda Castle, MD  pravastatin  (PRAVACHOL) 40 MG tablet TAKE 1 TABLET BY MOUTH EVERY EVENING 07/18/14  Yes Peter M Martinique, MD  PROVENTIL HFA 108 (90 BASE) MCG/ACT inhaler Inhale 2 puffs into the lungs every 6 (six) hours as needed for wheezing or shortness of breath. As directed 05/24/13  Yes Historical Provider, MD  tiotropium (SPIRIVA) 18 MCG inhalation capsule Place 1 capsule (18 mcg total) into inhaler and inhale daily. 08/11/14  Yes Juanito Doom, MD   BP 126/50 mmHg  Pulse 89  Temp(Src) 97.8 F (36.6 C)  Resp 21  Ht 5\' 10"  (1.778 m)  Wt 210 lb (95.255 kg)  BMI 30.13 kg/m2  SpO2 91% Physical Exam  Constitutional: He is oriented to person, place, and time.  Tachypneic, chronically ill   HENT:  Head: Normocephalic.  Mouth/Throat: Oropharynx is clear and moist.  Eyes: Conjunctivae are normal. Pupils are equal, round, and reactive to light.  Neck: Normal range of motion. Neck supple.  Cardiovascular: Normal rate, regular rhythm and normal heart sounds.   Pulmonary/Chest:  Tachypneic, mild diffuse wheezing. Diminished throughout   Abdominal: Soft. Bowel sounds are normal. He exhibits no distension. There is no tenderness. There is no rebound and no guarding.  Musculoskeletal: Normal range of motion.  1+ edema bilaterally. No calf tenderness.   Neurological: He is alert and oriented to person, place, and time.  Skin: Skin is warm and dry.  Psychiatric: He has a normal mood and affect. His behavior is normal. Judgment and thought content normal.  Nursing note and vitals reviewed.   ED Course  Procedures (including critical care time) Labs Review Labs Reviewed  CBC WITH DIFFERENTIAL/PLATELET - Abnormal; Notable for the following:    Hemoglobin 12.6 (*)    All other components within normal limits  COMPREHENSIVE METABOLIC PANEL - Abnormal; Notable for the following:    BUN 31 (*)    Creatinine, Ser 1.70 (*)    Albumin 3.4 (*)    Alkaline Phosphatase 37 (*)    GFR calc non Af Amer 37 (*)    GFR calc Af  Amer 42 (*)    All other components within normal limits  BRAIN NATRIURETIC PEPTIDE - Abnormal; Notable for the following:    B Natriuretic Peptide 176.5 (*)    All other components within normal limits  I-STAT TROPOININ, ED  I-STAT CG4 LACTIC ACID, ED    Imaging Review Dg Chest 2 View  01/13/2015   CLINICAL DATA:  One day history of cough and shortness of breath  EXAM: CHEST  2 VIEW  COMPARISON:  Chest radiograph January 03, 2014 and chest CT January 14, 2014  FINDINGS: There are scattered areas of mild scarring, primarily in the lower lung zones. There is no  frank edema or consolidation. The heart size and pulmonary vascularity are within normal limits. Pacemaker lead tips are attached to the right atrium and right ventricle. No adenopathy. No bone lesions.  IMPRESSION: No edema or consolidation. Scattered areas of mild scarring in the lower lung zones.   Electronically Signed   By: Lowella Grip III M.D.   On: 01/13/2015 21:50     EKG Interpretation   Date/Time:  Tuesday January 13 2015 20:36:49 EDT Ventricular Rate:  78 PR Interval:  157 QRS Duration: 113 QT Interval:  357 QTC Calculation: 407 R Axis:   -90 Text Interpretation:  Sinus rhythm Left anterior fascicular block Anterior  infarct, old No significant change since last tracing Confirmed by YAO   MD, DAVID ([id number]) on 01/13/2015 9:15:00 PM      MDM   Final diagnoses:  Chest pain  Shortness of breath    Antonio Ray is a 81 y.o. male here with SOB, cough. Tachypneic, borderline hypoxic. Concerned for possible pneumonia vs PE vs COPD (former smoker, no hx of COPD) vs CHF exacerbation. Will get labs, CXR, BNP. If CXR clear, will need CT angio.   11:49 PM CXR clear. Still hypoxic to 88-90% on RA despite duoneb. CT angio pending. Will need admission for hypoxia. Signed out to Dr. Sharol Given in the ED to f/u CT and admit patient.     Wandra Arthurs, MD 01/13/15 [phone]

## 2015-01-13 NOTE — ED Notes (Signed)
Patient transported to CT 

## 2015-01-13 NOTE — ED Notes (Signed)
Per EMS- Pt here from [facility] with c/o non productive, congested cough x 1 day. Pt denise n/v/d/CP. UCC gave 205mg  albuterol neb with no improvement. Pt reports "strange feeling in chest" when coughing. Pt has rhonchi/mild exp wheezing throughout. 12 lead from EMS unremarkable. CXR done at [facility]- reported to be clear. Hx one MI, pacemaker/defib.

## 2015-01-14 ENCOUNTER — Inpatient Hospital Stay (HOSPITAL_COMMUNITY): Payer: Medicare Other

## 2015-01-14 ENCOUNTER — Encounter (HOSPITAL_COMMUNITY): Payer: Self-pay

## 2015-01-14 DIAGNOSIS — N183 Chronic kidney disease, stage 3 (moderate): Secondary | ICD-10-CM

## 2015-01-14 DIAGNOSIS — I513 Intracardiac thrombosis, not elsewhere classified: Secondary | ICD-10-CM | POA: Diagnosis present

## 2015-01-14 DIAGNOSIS — I2510 Atherosclerotic heart disease of native coronary artery without angina pectoris: Secondary | ICD-10-CM

## 2015-01-14 DIAGNOSIS — I213 ST elevation (STEMI) myocardial infarction of unspecified site: Secondary | ICD-10-CM

## 2015-01-14 DIAGNOSIS — N179 Acute kidney failure, unspecified: Secondary | ICD-10-CM

## 2015-01-14 DIAGNOSIS — Z95810 Presence of automatic (implantable) cardiac defibrillator: Secondary | ICD-10-CM

## 2015-01-14 DIAGNOSIS — J441 Chronic obstructive pulmonary disease with (acute) exacerbation: Principal | ICD-10-CM

## 2015-01-14 DIAGNOSIS — I5023 Acute on chronic systolic (congestive) heart failure: Secondary | ICD-10-CM | POA: Diagnosis present

## 2015-01-14 DIAGNOSIS — I509 Heart failure, unspecified: Secondary | ICD-10-CM

## 2015-01-14 LAB — COMPREHENSIVE METABOLIC PANEL
ALT: 24 U/L (ref 17–63)
AST: 29 U/L (ref 15–41)
Albumin: 3.2 g/dL — ABNORMAL LOW (ref 3.5–5.0)
Alkaline Phosphatase: 37 U/L — ABNORMAL LOW (ref 38–126)
Anion gap: 10 (ref 5–15)
BUN: 28 mg/dL — ABNORMAL HIGH (ref 6–20)
CO2: 27 mmol/L (ref 22–32)
Calcium: 8.9 mg/dL (ref 8.9–10.3)
Chloride: 102 mmol/L (ref 101–111)
Creatinine, Ser: 1.72 mg/dL — ABNORMAL HIGH (ref 0.61–1.24)
GFR calc Af Amer: 42 mL/min — ABNORMAL LOW (ref >60)
GFR calc non Af Amer: 36 mL/min — ABNORMAL LOW (ref >60)
Glucose, Bld: 263 mg/dL — ABNORMAL HIGH (ref 65–99)
Potassium: 3.6 mmol/L (ref 3.5–5.1)
Sodium: 139 mmol/L (ref 135–145)
TOTAL PROTEIN: 6.8 g/dL (ref 6.5–8.1)
Total Bilirubin: 0.7 mg/dL (ref 0.3–1.2)

## 2015-01-14 LAB — I-STAT ARTERIAL BLOOD GAS, ED
Bicarbonate: 25.9 mEq/L — ABNORMAL HIGH (ref 20.0–24.0)
O2 Saturation: 98 %
PCO2 ART: 45.8 mmHg — AB (ref 35.0–45.0)
PH ART: 7.362 (ref 7.350–7.450)
PO2 ART: 103 mmHg — AB (ref 80.0–100.0)
TCO2: 27 mmol/L (ref 0–100)

## 2015-01-14 LAB — GLUCOSE, CAPILLARY
GLUCOSE-CAPILLARY: 307 mg/dL — AB (ref 65–99)
Glucose-Capillary: 208 mg/dL — ABNORMAL HIGH (ref 65–99)
Glucose-Capillary: 198 mg/dL — ABNORMAL HIGH (ref 65–99)
Glucose-Capillary: 226 mg/dL — ABNORMAL HIGH (ref 65–99)

## 2015-01-14 LAB — CBC WITH DIFFERENTIAL/PLATELET
Basophils Absolute: 0 10*3/uL (ref 0.0–0.1)
Basophils Relative: 0 % (ref 0–1)
Eosinophils Absolute: 0 10*3/uL (ref 0.0–0.7)
Eosinophils Relative: 1 % (ref 0–5)
HEMATOCRIT: 38.4 % — AB (ref 39.0–52.0)
HEMOGLOBIN: 12 g/dL — AB (ref 13.0–17.0)
LYMPHS ABS: 0.8 10*3/uL (ref 0.7–4.0)
Lymphocytes Relative: 12 % (ref 12–46)
MCH: 28 pg (ref 26.0–34.0)
MCHC: 31.3 g/dL (ref 30.0–36.0)
MCV: 89.5 fL (ref 78.0–100.0)
MONO ABS: 0.2 10*3/uL (ref 0.1–1.0)
Monocytes Relative: 4 % (ref 3–12)
NEUTROS PCT: 84 % — AB (ref 43–77)
Neutro Abs: 5.4 10*3/uL (ref 1.7–7.7)
Platelets: 242 10*3/uL (ref 150–400)
RBC: 4.29 MIL/uL (ref 4.22–5.81)
RDW: 14.1 % (ref 11.5–15.5)
WBC: 6.5 10*3/uL (ref 4.0–10.5)

## 2015-01-14 LAB — INFLUENZA PANEL BY PCR (TYPE A & B)
H1N1FLUPCR: NOT DETECTED
Influenza A By PCR: NEGATIVE
Influenza B By PCR: NEGATIVE

## 2015-01-14 LAB — TROPONIN I
Troponin I: <0.03 ng/mL (ref <0.031)
Troponin I: <0.03 ng/mL (ref <0.031)
Troponin I: <0.03 ng/mL (ref <0.031)

## 2015-01-14 LAB — STREP PNEUMONIAE URINARY ANTIGEN: STREP PNEUMO URINARY ANTIGEN: NEGATIVE

## 2015-01-14 LAB — PROTIME-INR
INR: 1.39 (ref 0.00–1.49)
PROTHROMBIN TIME: 17.1 s — AB (ref 11.6–15.2)

## 2015-01-14 LAB — HEPARIN LEVEL (UNFRACTIONATED): Heparin Unfractionated: 1.08 IU/mL — ABNORMAL HIGH (ref 0.30–0.70)

## 2015-01-14 LAB — APTT: APTT: 169 s — AB (ref 24–37)

## 2015-01-14 MED ORDER — IPRATROPIUM-ALBUTEROL 0.5-2.5 (3) MG/3ML IN SOLN
3.0000 mL | Freq: Two times a day (BID) | RESPIRATORY_TRACT | Status: DC
  Administered 2015-01-14 – 2015-01-15 (×2): 3 mL via RESPIRATORY_TRACT
  Filled 2015-01-14 (×3): qty 3

## 2015-01-14 MED ORDER — BENZONATATE 100 MG PO CAPS
100.0000 mg | ORAL_CAPSULE | Freq: Three times a day (TID) | ORAL | Status: DC
  Administered 2015-01-14 – 2015-01-15 (×5): 100 mg via ORAL
  Filled 2015-01-14 (×6): qty 1

## 2015-01-14 MED ORDER — INSULIN GLARGINE 100 UNIT/ML ~~LOC~~ SOLN
24.0000 [IU] | Freq: Every morning | SUBCUTANEOUS | Status: DC
  Administered 2015-01-14: 24 [IU] via SUBCUTANEOUS
  Filled 2015-01-14: qty 0.24

## 2015-01-14 MED ORDER — INSULIN ASPART 100 UNIT/ML ~~LOC~~ SOLN
0.0000 [IU] | Freq: Three times a day (TID) | SUBCUTANEOUS | Status: DC
  Administered 2015-01-14: 3 [IU] via SUBCUTANEOUS
  Administered 2015-01-14: 11 [IU] via SUBCUTANEOUS
  Administered 2015-01-14 – 2015-01-15 (×2): 5 [IU] via SUBCUTANEOUS
  Administered 2015-01-15: 11 [IU] via SUBCUTANEOUS
  Administered 2015-01-15: 8 [IU] via SUBCUTANEOUS

## 2015-01-14 MED ORDER — HEPARIN (PORCINE) IN NACL 100-0.45 UNIT/ML-% IJ SOLN
1100.0000 [IU]/h | INTRAMUSCULAR | Status: DC
  Filled 2015-01-14: qty 250

## 2015-01-14 MED ORDER — METHYLPREDNISOLONE SODIUM SUCC 125 MG IJ SOLR
60.0000 mg | Freq: Four times a day (QID) | INTRAMUSCULAR | Status: DC
  Administered 2015-01-14 (×2): 60 mg via INTRAVENOUS
  Filled 2015-01-14 (×5): qty 0.96

## 2015-01-14 MED ORDER — INSULIN GLARGINE 100 UNIT/ML ~~LOC~~ SOLN
28.0000 [IU] | Freq: Every morning | SUBCUTANEOUS | Status: DC
  Administered 2015-01-15: 28 [IU] via SUBCUTANEOUS
  Filled 2015-01-14: qty 0.28

## 2015-01-14 MED ORDER — HEPARIN (PORCINE) IN NACL 100-0.45 UNIT/ML-% IJ SOLN
1300.0000 [IU]/h | INTRAMUSCULAR | Status: DC
  Administered 2015-01-14: 1300 [IU]/h via INTRAVENOUS
  Filled 2015-01-14 (×2): qty 250

## 2015-01-14 MED ORDER — BUDESONIDE-FORMOTEROL FUMARATE 160-4.5 MCG/ACT IN AERO
2.0000 | INHALATION_SPRAY | Freq: Two times a day (BID) | RESPIRATORY_TRACT | Status: DC
  Administered 2015-01-14 – 2015-01-15 (×3): 2 via RESPIRATORY_TRACT
  Filled 2015-01-14: qty 6

## 2015-01-14 MED ORDER — FUROSEMIDE 10 MG/ML IJ SOLN
20.0000 mg | Freq: Once | INTRAMUSCULAR | Status: DC
Start: 2015-01-14 — End: 2015-01-14
  Administered 2015-01-14: 20 mg via INTRAVENOUS
  Filled 2015-01-14: qty 2

## 2015-01-14 MED ORDER — PANTOPRAZOLE SODIUM 40 MG PO TBEC
40.0000 mg | DELAYED_RELEASE_TABLET | Freq: Every day | ORAL | Status: DC
  Administered 2015-01-14 – 2015-01-15 (×2): 40 mg via ORAL
  Filled 2015-01-14 (×2): qty 1

## 2015-01-14 MED ORDER — CEFTRIAXONE SODIUM IN DEXTROSE 20 MG/ML IV SOLN
1.0000 g | Freq: Every day | INTRAVENOUS | Status: DC
  Administered 2015-01-14 – 2015-01-15 (×2): 1 g via INTRAVENOUS
  Filled 2015-01-14 (×3): qty 50

## 2015-01-14 MED ORDER — IOHEXOL 350 MG/ML SOLN
80.0000 mL | Freq: Once | INTRAVENOUS | Status: AC | PRN
  Administered 2015-01-14: 75 mL via INTRAVENOUS

## 2015-01-14 MED ORDER — DEXTROSE 5 % IV SOLN
500.0000 mg | Freq: Every day | INTRAVENOUS | Status: DC
  Administered 2015-01-14 – 2015-01-15 (×2): 500 mg via INTRAVENOUS
  Filled 2015-01-14 (×3): qty 500

## 2015-01-14 MED ORDER — GUAIFENESIN ER 600 MG PO TB12
600.0000 mg | ORAL_TABLET | Freq: Two times a day (BID) | ORAL | Status: DC
  Administered 2015-01-14 – 2015-01-15 (×4): 600 mg via ORAL
  Filled 2015-01-14 (×5): qty 1

## 2015-01-14 MED ORDER — MENTHOL 3 MG MT LOZG
1.0000 | LOZENGE | OROMUCOSAL | Status: DC | PRN
  Filled 2015-01-14: qty 9

## 2015-01-14 MED ORDER — NITROGLYCERIN 0.2 MG/HR TD PT24
0.2000 mg | MEDICATED_PATCH | Freq: Every day | TRANSDERMAL | Status: DC
  Administered 2015-01-14 – 2015-01-15 (×2): 0.2 mg via TRANSDERMAL
  Filled 2015-01-14 (×2): qty 1

## 2015-01-14 MED ORDER — ASPIRIN EC 81 MG PO TBEC
81.0000 mg | DELAYED_RELEASE_TABLET | Freq: Every day | ORAL | Status: DC
  Administered 2015-01-14 – 2015-01-15 (×2): 81 mg via ORAL
  Filled 2015-01-14 (×2): qty 1

## 2015-01-14 MED ORDER — PANTOPRAZOLE SODIUM 20 MG PO TBEC
20.0000 mg | DELAYED_RELEASE_TABLET | Freq: Every day | ORAL | Status: DC
  Filled 2015-01-14: qty 1

## 2015-01-14 MED ORDER — IPRATROPIUM-ALBUTEROL 0.5-2.5 (3) MG/3ML IN SOLN: 3.0000 mL | RESPIRATORY_TRACT | Status: DC

## 2015-01-14 MED ORDER — ASPIRIN 81 MG PO TBEC: 81.0000 mg | DELAYED_RELEASE_TABLET | Freq: Every day | ORAL | Status: DC

## 2015-01-14 MED ORDER — INSULIN ASPART 100 UNIT/ML ~~LOC~~ SOLN
0.0000 [IU] | Freq: Every day | SUBCUTANEOUS | Status: DC
  Administered 2015-01-14: 2 [IU] via SUBCUTANEOUS

## 2015-01-14 MED ORDER — FUROSEMIDE 20 MG PO TABS
20.0000 mg | ORAL_TABLET | Freq: Every day | ORAL | Status: DC
  Administered 2015-01-14 – 2015-01-15 (×2): 20 mg via ORAL
  Filled 2015-01-14 (×2): qty 1

## 2015-01-14 MED ORDER — DONEPEZIL HCL 10 MG PO TABS
10.0000 mg | ORAL_TABLET | Freq: Every morning | ORAL | Status: DC
  Administered 2015-01-14 – 2015-01-15 (×2): 10 mg via ORAL
  Filled 2015-01-14 (×2): qty 1

## 2015-01-14 MED ORDER — PRAVASTATIN SODIUM 40 MG PO TABS
40.0000 mg | ORAL_TABLET | Freq: Every evening | ORAL | Status: DC
  Administered 2015-01-14 – 2015-01-15 (×2): 40 mg via ORAL
  Filled 2015-01-14 (×2): qty 1

## 2015-01-14 MED ORDER — HEPARIN BOLUS VIA INFUSION
4000.0000 [IU] | Freq: Once | INTRAVENOUS | Status: AC
  Administered 2015-01-14: 4000 [IU] via INTRAVENOUS
  Filled 2015-01-14: qty 4000

## 2015-01-14 MED ORDER — METHYLPREDNISOLONE SODIUM SUCC 40 MG IJ SOLR
40.0000 mg | Freq: Three times a day (TID) | INTRAMUSCULAR | Status: DC
  Administered 2015-01-14 – 2015-01-15 (×2): 40 mg via INTRAVENOUS
  Filled 2015-01-14 (×5): qty 1

## 2015-01-14 NOTE — Progress Notes (Signed)
[facility]  Patient admitted overnight with COPD flare and incidentally found to have an LV thrombus noted on CT of the chest. He was started on IV steroids, IV antibiotics, nebulizer treatments and IV heparin.  Subjective: Feels that his breathing and cough are improving. No complaints of chest pain, nausea, vomiting, abdominal pain or diarrhea.  Principal Problem:   COPD with acute exacerbation -Appears to be improving -Continue with nebulizer treatments and Mucinex-wean Solu-Medrol   Active Problems:  Possible pneumonia -CT scan is suggestive of pneumonia versus "small airway disease"-  he was started on Rocephin and Zithromax on admission-will continue  LV thrombus -Continue heparin-cardiology consult requested-will need TEE    Diabetes mellitus type II, non insulin dependent -Continue Lantus and sliding scale insulin-will need to increase Lantus dose- sugars are elevated as a result of steroids    GERD (gastroesophageal reflux disease) -Continue Protonix   Acute renal failure on CKD (chronic kidney disease) stage 3, GFR 30-59 ml/min -Creatinine 1.2 at baseline-elevated to 1.7 currently-furosemide has been continued- Cozaar on hold    Automatic implantable cardioverter-defibrillator in situ - follow with Dr Rayann Heman  Congestive heart failure -According to notes from cardiology, patient has chronic systolic heart failure with an EF of 30-35% and is intolerant to Coreg -Continue furosemide  Debbe Odea, MD

## 2015-01-14 NOTE — Consult Note (Signed)
Reason for Consult:  LV thrombus Referring Physician:   CAMARI Ray is an 81 y.o. male.  HPI:   The patient is a 81 yo male with a history of CAD, MI 1994-angioplasty to LAD, Chronic sys CHF, ICD, HLD, DM,CKD III, COPD.  Cardiac catheterization 2007 showed nonobstructive disease. He has an ischemic cardiomyopathy with ejection fraction of 30-35%. His last Myoview in 2011 showed an anterior wall scar without ischemia. He presented with SOB and productive cough for a couple days.  CTA chest revealed and apical LV aneurysm and thrombus.  Echo cardiogram this admission reveals EF of 35-40%, LVEF 35-40%, anterior, anteroseptal and apical scar wtih akinesis. Buldging of the apex consistent with aneurysm., Diastolic dysfunction with elevated LV filling pressure, mildLAE, dilated IVC, pacer wires noted in the right heart.   The patient currently denies nausea, vomiting, fever, chest pain, orthopnea, dizziness, PND, abdominal pain, hematochezia, melena, lower extremity edema.   Past Medical History  Diagnosis Date  . GERD (gastroesophageal reflux disease)   . Coronary artery disease     History of remote anterior MI with PCI to LAD in 2006; most recent cath 2007, no intervention required  . MI, old 2007    ANTERIOR  . Chronic systolic dysfunction of left ventricle     EF 30-35%, CLASS II - III SYMPTOMS; intolerant to Coreg  . Hyperlipidemia   . PVC's (premature ventricular contractions)   . Diabetes mellitus   . DOE (dyspnea on exertion)   . ICD (implantable cardiac defibrillator) in place August 2012  . Intolerance, drug     Intolerant to beta blockers  . Myocardial infarction   . Pneumonia   . CRF (chronic renal failure)   . BPH (benign prostatic hyperplasia)   . CKD (chronic kidney disease) stage 3, GFR 30-59 ml/min 06/08/2012  . CHF (congestive heart failure)   . COPD (chronic obstructive pulmonary disease)     Past Surgical History  Procedure Laterality Date  . Cardiac  catheterization  01/11/2006    DEMONSTRATES AKINESIA OF THE DISTAL ANTERIOR WALL, DISTAL INFERIOR WALL AND AKINESIA OF THE APEX. THE BASAL SEGMENTS CONTRACT WELL AND OVERALL EF 35%  . Coronary angioplasty  1994    TO THE LAD  . Foot surgery Right     remote  . Cholecystectomy  2/11  . Icd implant  03/30/11    Analyze ST study patient    Family History  Problem Relation Age of Onset  . Stroke Father     Social History:  reports that he quit smoking about 22 years ago. His smoking use included Cigarettes. He has a 35 pack-year smoking history. He has never used smokeless tobacco. He reports that he drinks alcohol. He reports that he does not use illicit drugs.  Allergies:  Allergies  Allergen Reactions  . Codeine Other (See Comments)    Gets very angry, disoriented  . Dilaudid [Hydromorphone Hcl] Other (See Comments)    VERY AGITATED, HOSTILE  . Flomax [Tamsulosin Hcl] Shortness Of Breath  . Morphine And Related Other (See Comments)    VERY AGITATED, HOSTILE  . Sulfa Antibiotics Shortness Of Breath  . Carvedilol Other (See Comments)    DISORIENTATION  . Levofloxacin Other (See Comments)    unknown    Medications:  Scheduled Meds: . aspirin EC  81 mg Oral Daily  . azithromycin  500 mg Intravenous Q0600  . benzonatate  100 mg Oral TID  . budesonide-formoterol  2 puff Inhalation BID  .  cefTRIAXone (ROCEPHIN)  IV  1 g Intravenous Q0600  . donepezil  10 mg Oral q morning - 10a  . furosemide  20 mg Oral Daily  . guaiFENesin  600 mg Oral BID  . insulin aspart  0-15 Units Subcutaneous TID WC  . insulin aspart  0-5 Units Subcutaneous QHS  . [START ON 01/15/2015] insulin glargine  28 Units Subcutaneous q morning - 10a  . ipratropium-albuterol  3 mL Nebulization BID  . methylPREDNISolone (SOLU-MEDROL) injection  40 mg Intravenous 3 times per day  . nitroGLYCERIN  0.2 mg Transdermal Daily  . pantoprazole  40 mg Oral Daily  . pravastatin  40 mg Oral QPM   Continuous Infusions: .  heparin 1,100 Units/hr (01/14/15 1317)   PRN Meds:.menthol-cetylpyridinium   Results for orders placed or performed during the hospital encounter of 01/13/15 (from the past 48 hour(s))  Brain natriuretic peptide     Status: Abnormal   Collection Time: 01/13/15  8:59 PM  Result Value Ref Range   B Natriuretic Peptide 176.5 (H) 0.0 - 100.0 pg/mL  CBC with Differential/Platelet     Status: Abnormal   Collection Time: 01/13/15  9:08 PM  Result Value Ref Range   WBC 7.5 4.0 - 10.5 K/uL   RBC 4.48 4.22 - 5.81 MIL/uL   Hemoglobin 12.6 (L) 13.0 - 17.0 g/dL   HCT 39.9 39.0 - 52.0 %   MCV 89.1 78.0 - 100.0 fL   MCH 28.1 26.0 - 34.0 pg   MCHC 31.6 30.0 - 36.0 g/dL   RDW 13.9 11.5 - 15.5 %   Platelets 245 150 - 400 K/uL   Neutrophils Relative % 67 43 - 77 %   Neutro Abs 5.1 1.7 - 7.7 K/uL   Lymphocytes Relative 18 12 - 46 %   Lymphs Abs 1.3 0.7 - 4.0 K/uL   Monocytes Relative 11 3 - 12 %   Monocytes Absolute 0.8 0.1 - 1.0 K/uL   Eosinophils Relative 4 0 - 5 %   Eosinophils Absolute 0.3 0.0 - 0.7 K/uL   Basophils Relative 0 0 - 1 %   Basophils Absolute 0.0 0.0 - 0.1 K/uL  Comprehensive metabolic panel     Status: Abnormal   Collection Time: 01/13/15  9:08 PM  Result Value Ref Range   Sodium 138 135 - 145 mmol/L   Potassium 4.0 3.5 - 5.1 mmol/L   Chloride 104 101 - 111 mmol/L   CO2 26 22 - 32 mmol/L   Glucose, Bld 96 65 - 99 mg/dL   BUN 31 (H) 6 - 20 mg/dL   Creatinine, Ser 1.70 (H) 0.61 - 1.24 mg/dL   Calcium 9.3 8.9 - 10.3 mg/dL   Total Protein 6.5 6.5 - 8.1 g/dL   Albumin 3.4 (L) 3.5 - 5.0 g/dL   AST 27 15 - 41 U/L   ALT 25 17 - 63 U/L   Alkaline Phosphatase 37 (L) 38 - 126 U/L   Total Bilirubin 0.5 0.3 - 1.2 mg/dL   GFR calc non Af Amer 37 (L) >60 mL/min   GFR calc Af Amer 42 (L) >60 mL/min    Comment: (NOTE) The eGFR has been calculated using the CKD EPI equation. This calculation has not been validated in all clinical situations. eGFR's persistently <60 mL/min signify  possible Chronic Kidney Disease.    Anion gap 8 5 - 15  I-Stat CG4 Lactic Acid, ED     Status: None   Collection Time: 01/13/15  9:21 PM  Result Value Ref Range   Lactic Acid, Venous 0.70 0.5 - 2.0 mmol/L  I-Stat arterial blood gas, ED     Status: Abnormal   Collection Time: 01/14/15 12:44 AM  Result Value Ref Range   pH, Arterial 7.362 7.350 - 7.450   pCO2 arterial 45.8 (H) 35.0 - 45.0 mmHg   pO2, Arterial 103.0 (H) 80.0 - 100.0 mmHg   Bicarbonate 25.9 (H) 20.0 - 24.0 mEq/L   TCO2 27 0 - 100 mmol/L   O2 Saturation 98.0 %   Sample type ARTERIAL   Influenza panel by PCR (type A & B, H1N1) (Not at [facility])     Status: None   Collection Time: 01/14/15  2:05 AM  Result Value Ref Range   Influenza A By PCR NEGATIVE NEGATIVE   Influenza B By PCR NEGATIVE NEGATIVE   H1N1 flu by pcr NOT DETECTED NOT DETECTED    Comment:        The Xpert Flu assay (FDA approved for nasal aspirates or washes and nasopharyngeal swab specimens), is intended as an aid in the diagnosis of influenza and should not be used as a sole basis for treatment.   Strep pneumoniae urinary antigen  (not at [facility])     Status: None   Collection Time: 01/14/15  3:05 AM  Result Value Ref Range   Strep Pneumo Urinary Antigen NEGATIVE NEGATIVE    Comment:        Infection due to S. pneumoniae cannot be absolutely ruled out since the antigen present may be below the detection limit of the test.   CBC with Differential/Platelet     Status: Abnormal   Collection Time: 01/14/15  4:05 AM  Result Value Ref Range   WBC 6.5 4.0 - 10.5 K/uL   RBC 4.29 4.22 - 5.81 MIL/uL   Hemoglobin 12.0 (L) 13.0 - 17.0 g/dL   HCT 38.4 (L) 39.0 - 52.0 %   MCV 89.5 78.0 - 100.0 fL   MCH 28.0 26.0 - 34.0 pg   MCHC 31.3 30.0 - 36.0 g/dL   RDW 14.1 11.5 - 15.5 %   Platelets 242 150 - 400 K/uL   Neutrophils Relative % 84 (H) 43 - 77 %   Neutro Abs 5.4 1.7 - 7.7 K/uL   Lymphocytes Relative 12 12 - 46 %   Lymphs Abs 0.8 0.7 - 4.0 K/uL    Monocytes Relative 4 3 - 12 %   Monocytes Absolute 0.2 0.1 - 1.0 K/uL   Eosinophils Relative 1 0 - 5 %   Eosinophils Absolute 0.0 0.0 - 0.7 K/uL   Basophils Relative 0 0 - 1 %   Basophils Absolute 0.0 0.0 - 0.1 K/uL  Comprehensive metabolic panel     Status: Abnormal   Collection Time: 01/14/15  4:05 AM  Result Value Ref Range   Sodium 139 135 - 145 mmol/L   Potassium 3.6 3.5 - 5.1 mmol/L   Chloride 102 101 - 111 mmol/L   CO2 27 22 - 32 mmol/L   Glucose, Bld 263 (H) 65 - 99 mg/dL   BUN 28 (H) 6 - 20 mg/dL   Creatinine, Ser 1.72 (H) 0.61 - 1.24 mg/dL   Calcium 8.9 8.9 - 10.3 mg/dL   Total Protein 6.8 6.5 - 8.1 g/dL   Albumin 3.2 (L) 3.5 - 5.0 g/dL   AST 29 15 - 41 U/L   ALT 24 17 - 63 U/L   Alkaline Phosphatase 37 (L) 38 - 126 U/L  Total Bilirubin 0.7 0.3 - 1.2 mg/dL   GFR calc non Af Amer 36 (L) >60 mL/min   GFR calc Af Amer 42 (L) >60 mL/min    Comment: (NOTE) The eGFR has been calculated using the CKD EPI equation. This calculation has not been validated in all clinical situations. eGFR's persistently <60 mL/min signify possible Chronic Kidney Disease.    Anion gap 10 5 - 15  Protime-INR     Status: Abnormal   Collection Time: 01/14/15  4:05 AM  Result Value Ref Range   Prothrombin Time 17.1 (H) 11.6 - 15.2 seconds   INR 1.39 0.00 - 1.49  APTT     Status: Abnormal   Collection Time: 01/14/15  4:05 AM  Result Value Ref Range   aPTT 169 (H) 24 - 37 seconds    Comment:        IF BASELINE aPTT IS ELEVATED, SUGGEST PATIENT RISK ASSESSMENT BE USED TO DETERMINE APPROPRIATE ANTICOAGULANT THERAPY.   Troponin I (q 6hr x 3)     Status: None   Collection Time: 01/14/15  4:05 AM  Result Value Ref Range   Troponin I <0.03 <0.031 ng/mL    Comment:        NO INDICATION OF MYOCARDIAL INJURY.   Glucose, capillary     Status: Abnormal   Collection Time: 01/14/15  6:35 AM  Result Value Ref Range   Glucose-Capillary 307 (H) 65 - 99 mg/dL   Comment 1 Notify RN    Comment 2  Document in Chart   Troponin I (q 6hr x 3)     Status: None   Collection Time: 01/14/15  8:34 AM  Result Value Ref Range   Troponin I <0.03 <0.031 ng/mL    Comment:        NO INDICATION OF MYOCARDIAL INJURY.   Heparin level (unfractionated)     Status: Abnormal   Collection Time: 01/14/15 10:40 AM  Result Value Ref Range   Heparin Unfractionated 1.08 (H) 0.30 - 0.70 IU/mL    Comment:        IF HEPARIN RESULTS ARE BELOW EXPECTED VALUES, AND PATIENT DOSAGE HAS BEEN CONFIRMED, SUGGEST FOLLOW UP TESTING OF ANTITHROMBIN III LEVELS.   Glucose, capillary     Status: Abnormal   Collection Time: 01/14/15 11:24 AM  Result Value Ref Range   Glucose-Capillary 208 (H) 65 - 99 mg/dL   Comment 1 Notify RN     Dg Chest 2 View  01/13/2015   CLINICAL DATA:  One day history of cough and shortness of breath  EXAM: CHEST  2 VIEW  COMPARISON:  Chest radiograph January 03, 2014 and chest CT January 14, 2014  FINDINGS: There are scattered areas of mild scarring, primarily in the lower lung zones. There is no frank edema or consolidation. The heart size and pulmonary vascularity are within normal limits. Pacemaker lead tips are attached to the right atrium and right ventricle. No adenopathy. No bone lesions.  IMPRESSION: No edema or consolidation. Scattered areas of mild scarring in the lower lung zones.   Electronically Signed   By: Lowella Grip III M.D.   On: 01/13/2015 21:50   Ct Angio Chest Pe W/cm &/or Wo Cm  01/14/2015   CLINICAL DATA:  Shortness of breath starting around 4 a.m. yesterday morning.  EXAM: CT ANGIOGRAPHY CHEST WITH CONTRAST  TECHNIQUE: Multidetector CT imaging of the chest was performed using the standard protocol during bolus administration of intravenous contrast. Multiplanar CT image reconstructions and MIPs  were obtained to evaluate the vascular anatomy.  CONTRAST:  33m OMNIPAQUE IOHEXOL 350 MG/ML SOLN  COMPARISON:  01/14/2014  FINDINGS: Technically adequate study with good  opacification of the central and segmental pulmonary arteries. No focal filling defects are demonstrated. No evidence of significant pulmonary embolus.  Normal heart size. There appears to be a small apical left ventricular aneurysm containing thrombus. Coronary artery calcifications. Normal caliber thoracic aorta without aneurysm or dissection. Aortic calcification. Great vessel origins are patent. Scattered lymph nodes in the mediastinum are not pathologically enlarged. Esophagus is mildly dilated with mildly thickened wall and air-fluid level. This could indicate dysmotility, reflux, or achalasia.  Evaluation of lungs is limited due to respiratory motion artifact. There appears to be some patchy areas of airspace disease and tree-in-bud infiltrates in the lung bases, greater on the right. This may represent pneumonia or small airways disease. Airways appear patent. No focal consolidation. No pneumothorax. No pleural effusions.  Included portions of the upper abdominal organs demonstrate surgical absence of the gallbladder. Configuration of the liver suggest possible hepatic cirrhosis. Fatty infiltration of the pancreas. Degenerative changes in the spine. No destructive bone lesions.  Review of the MIP images confirms the above findings.  IMPRESSION: No evidence of significant pulmonary embolus. Apical left ventricular aneurysm containing thrombus. Dilatation of the esophagus with air-fluid level suggesting reflux or achalasia. Patchy airspace disease in the lung bases, greater on the right may indicate pneumonia or small airways disease.   Electronically Signed   By: WLucienne CapersM.D.   On: 01/14/2015 00:49    Review of Systems  Constitutional: Negative for fever and diaphoresis.  HENT: Positive for congestion.   Respiratory: Positive for cough and shortness of breath.   Cardiovascular: Negative for chest pain, orthopnea, leg swelling and PND.  Gastrointestinal: Negative for nausea, vomiting,  abdominal pain and blood in stool.  Musculoskeletal: Negative for myalgias.  Neurological: Negative for dizziness.  All other systems reviewed and are negative.  Blood pressure 118/85, pulse 86, temperature 98.9 F (37.2 C), temperature source Oral, resp. rate 20, height 5' 10"  (1.778 m), weight 190 lb 12.8 oz (86.546 kg), SpO2 94 %. Physical Exam  Nursing note and vitals reviewed. Constitutional: He is oriented to person, place, and time. He appears well-developed and well-nourished. No distress.  HENT:  Head: Normocephalic and atraumatic.  Eyes: EOM are normal. Pupils are equal, round, and reactive to light. No scleral icterus.  Neck: Normal range of motion. Neck supple.  Cardiovascular: Normal rate, regular rhythm, S1 normal and S2 normal.   No murmur heard. Pulses:      Radial pulses are 2+ on the right side, and 2+ on the left side.       Dorsalis pedis pulses are 2+ on the right side, and 2+ on the left side.  **Heart sounds were difficult to hear  Respiratory: Effort normal. He has no wheezes.  Crackles in the right base and centrally  GI: Soft. Bowel sounds are normal. He exhibits no distension. There is no tenderness.  Musculoskeletal: He exhibits no edema.  Lymphadenopathy:    He has no cervical adenopathy.  Neurological: He is alert and oriented to person, place, and time. He exhibits normal muscle tone.  Skin: Skin is warm and dry.  Psychiatric: He has a normal mood and affect.    Assessment/Plan: Principal Problem:   COPD with acute exacerbation Active Problems:   Coronary artery disease   Diabetes mellitus type II, non insulin dependent   GERD (gastroesophageal  reflux disease)   CKD (chronic kidney disease) stage 3, GFR 30-59 ml/min   Automatic implantable cardioverter-defibrillator in situ   Left ventricular apical thrombus   Acute on chronic systolic CHF (congestive heart failure)   ARF (acute renal failure)  He has had apical akinesis for at least 8 years  and likely had this aneurysm for quite some time.  Last year's Ct chest images look like he may have had it then(no contrast however).  The patient also thinks he was told about it years ago.  He was on coumadin at one point but he can not remember why.  He is currently on heparin.  After discussing with Dr. Ellyn Hack it is likely layered thrombus which does not require anticoagulation.  Watch for volume overload.  Continue lasix.  Elevated LV filling pressures on echo.   Tarri Fuller, [place] 01/14/2015, 1:31 PM    I have seen, examined and evaluated the patient this PM along with Mr. Samara Snide, [place].  After reviewing all the available data and chart,  I agree with his findings, examination as well as impression recommendations.  Very pleasant 81 year old gentleman with long-standing ischemic cardiomyopathy with apical akinesis/dyskinesis from a anterior MI back in the 90s. He has had documented apical akinesis/dyskinesis for quite a long time and apparently took warfarin for a year or so after his MI back in the 90s. Consulted due to a LV apical thrombus with apical aneurysm. On CT it does appear to be somewhat calcified and likely layered. It was not commented on on echocardiogram it was was not done with Definity study. Patient really is not having any active cardiac symptoms or heart failure symptoms despite having elevated filling pressures on the echocardiogram. At this point I don't think that any acute management of this some finding on CT scan is warranted. I think this is probably a chronic known finding with a layered/laminated apical thrombus and I would not treat with any anticoagulation. I was able to discontinue his cardiac medications including his standing diuretic to avoid any potential volume overload and heart failure.  I will notify his primary cardiologist that he is here. However don't think he necessarily needs to be followed by cardiology. We will sign off and be available at any acute  exacerbation.    Leonie Man, M.D., M.S. Interventional Cardiologist   Pager # [phone]

## 2015-01-14 NOTE — H&P (Signed)
[facility] History and Physical  Patient: Antonio Ray  MRN: [id number]  DOB: [birth year]  DOS: the patient was seen and examined on 01/14/2015 PCP: [name], MD  Referring physician: Dr. Darl Householder Chief Complaint: Shortness of breath and cough  HPI: Antonio Ray is a 81 y.o. male with Past medical history of GERD, coronary artery disease, chronic systolic CHF with ejection fraction 35%, obesity, diabetes mellitus type 2, dyslipidemia, chronic kidney disease, COPD stage IV gold. The patient is presenting with complaints of cough and shortness of breath which started since last 2 days. And has been progressively worsening. The cough is without expectoration. He also has some chest pain and epigastric pain which is associated with cough and feels like pins and needles. He denies any nausea or vomiting he denies any diarrhea or constipation and denies any abdominal pain anywhere else denies any burning urination. He complains of chronic leg swelling which is unchanged. He also complains of some lightheadedness. He denies any change in his medication and mention he is compliant with all medication. He is on a research study for [place] monitoring. Patient has just returned from a trip to [place] few days ago.  The patient is coming from home. And at his baseline independent for most of his ADL.  Review of Systems: as mentioned in the history of present illness.  A comprehensive review of the other systems is negative.  Past Medical History  Diagnosis Date  . GERD (gastroesophageal reflux disease)   . Coronary artery disease     History of remote anterior MI with PCI to LAD in 2006; most recent cath 2007, no intervention required  . MI, old 2007    ANTERIOR  . Chronic systolic dysfunction of left ventricle     EF 30-35%, CLASS II - III SYMPTOMS; intolerant to Coreg  . Hyperlipidemia   . PVC's (premature ventricular contractions)   . Diabetes mellitus   . DOE (dyspnea  on exertion)   . ICD (implantable cardiac defibrillator) in place August 2012  . Intolerance, drug     Intolerant to beta blockers  . Myocardial infarction   . Pneumonia   . CRF (chronic renal failure)   . BPH (benign prostatic hyperplasia)   . CKD (chronic kidney disease) stage 3, GFR 30-59 ml/min 06/08/2012  . CHF (congestive heart failure)   . COPD (chronic obstructive pulmonary disease)    Past Surgical History  Procedure Laterality Date  . Cardiac catheterization  01/11/2006    DEMONSTRATES AKINESIA OF THE DISTAL ANTERIOR WALL, DISTAL INFERIOR WALL AND AKINESIA OF THE APEX. THE BASAL SEGMENTS CONTRACT WELL AND OVERALL EF 35%  . Coronary angioplasty  1994    TO THE LAD  . Foot surgery Right     remote  . Cholecystectomy  2/11  . Icd implant  03/30/11    Analyze ST study patient   Social History:  reports that he quit smoking about 22 years ago. His smoking use included Cigarettes. He has a 35 pack-year smoking history. He has never used smokeless tobacco. He reports that he drinks alcohol. He reports that he does not use illicit drugs.  Allergies  Allergen Reactions  . Codeine Other (See Comments)    Gets very angry, disoriented  . Dilaudid [Hydromorphone Hcl] Other (See Comments)    VERY AGITATED, HOSTILE  . Flomax [Tamsulosin Hcl] Shortness Of Breath  . Morphine And Related Other (See Comments)    VERY AGITATED, HOSTILE  . Sulfa Antibiotics  Shortness Of Breath  . Carvedilol Other (See Comments)    DISORIENTATION  . Levofloxacin Other (See Comments)    unknown    Family History  Problem Relation Age of Onset  . Stroke Father     Prior to Admission medications   Medication Sig Start Date End Date Taking? Authorizing Provider  aspirin 81 MG EC tablet Take 81 mg by mouth daily.     Yes Historical Provider, MD  budesonide-formoterol (SYMBICORT) 160-4.5 MCG/ACT inhaler Inhale 2 puffs into the lungs 2 (two) times daily. 09/19/14  Yes Juanito Doom, MD    Cholecalciferol (VITAMIN D PO) Take 2,000 Units by mouth daily.    Yes Historical Provider, MD  donepezil (ARICEPT) 10 MG tablet Take 10 mg by mouth every morning.   Yes Historical Provider, MD  fenofibrate (TRICOR) 145 MG tablet TAKE 1 TABLET BY MOUTH EVERY DAY 07/18/14  Yes Peter M Martinique, MD  furosemide (LASIX) 20 MG tablet TAKE 1 TABLET BY MOUTH EVERY DAY 09/19/14  Yes Peter M Martinique, MD  insulin glargine (LANTUS) 100 UNIT/ML injection Inject 24 Units into the skin every morning.    Yes Historical Provider, MD  losartan (COZAAR) 50 MG tablet Take 25 mg by mouth daily. 05/17/13  Yes [place], PA-C  nitroGLYCERIN (NITRODUR - DOSED IN MG/24 HR) 0.2 mg/hr patch PLACE 1 PATCH ONTO THE SKIN DAILY 04/02/14  Yes Peter M Martinique, MD  pantoprazole (PROTONIX) 20 MG tablet TAKE 1 TABLET BY MOUTH EVERY DAY 07/18/14  Yes Inda Castle, MD  pravastatin (PRAVACHOL) 40 MG tablet TAKE 1 TABLET BY MOUTH EVERY EVENING 07/18/14  Yes Peter M Martinique, MD  PROVENTIL HFA 108 (90 BASE) MCG/ACT inhaler Inhale 2 puffs into the lungs every 6 (six) hours as needed for wheezing or shortness of breath. As directed 05/24/13  Yes Historical Provider, MD  tiotropium (SPIRIVA) 18 MCG inhalation capsule Place 1 capsule (18 mcg total) into inhaler and inhale daily. 08/11/14  Yes Juanito Doom, MD    Physical Exam: Filed Vitals:   01/13/15 2312 01/13/15 2315 01/14/15 0030 01/14/15 0045  BP: 109/56 126/50 154/72 122/57  Pulse: 90 89 90 96  Temp:      Resp: 20 21 27 21   Height:      Weight:      SpO2: 95% 91% 94% 91%    General: Alert, Awake and Oriented to Time, Place and Person. Appear in mild distress Eyes: PERRL ENT: Oral Mucosa clear moist. Neck: Difficult to assess JVD Cardiovascular: S1 and S2 Present, aortic systolic Murmur, Peripheral Pulses Present Respiratory: Bilateral Air entry equal and Decreased,  Extensive bilateral rhonchi and Crackles, extensive expiratory wheezes Abdomen: Bowel Sound present,  Soft and non tender Skin: no Rash Extremities: Bilateral Pedal edema, no calf tenderness Neurologic: Grossly no focal neuro deficit.  Labs on Admission:  CBC:  Recent Labs Lab 01/13/15 2108  WBC 7.5  NEUTROABS 5.1  HGB 12.6*  HCT 39.9  MCV 89.1  PLT 245    CMP     Component Value Date/Time   NA 138 01/13/2015 2108   K 4.0 01/13/2015 2108   CL 104 01/13/2015 2108   CO2 26 01/13/2015 2108   GLUCOSE 96 01/13/2015 2108   BUN 31* 01/13/2015 2108   CREATININE 1.70* 01/13/2015 2108   CREATININE 1.89* 05/19/2012 1046   CALCIUM 9.3 01/13/2015 2108   PROT 6.5 01/13/2015 2108   ALBUMIN 3.4* 01/13/2015 2108   AST 27 01/13/2015 2108   ALT  25 01/13/2015 2108   ALKPHOS 37* 01/13/2015 2108   BILITOT 0.5 01/13/2015 2108   GFRNONAA 37* 01/13/2015 2108   GFRAA 42* 01/13/2015 2108    No results for input(s): LIPASE, AMYLASE in the last 168 hours.  No results for input(s): CKTOTAL, CKMB, CKMBINDEX, TROPONINI in the last 168 hours. BNP (last 3 results)  Recent Labs  01/13/15 2059  BNP 176.5*    ProBNP (last 3 results) No results for input(s): PROBNP in the last 8760 hours.   Radiological Exams on Admission: Dg Chest 2 View  01/13/2015   CLINICAL DATA:  One day history of cough and shortness of breath  EXAM: CHEST  2 VIEW  COMPARISON:  Chest radiograph January 03, 2014 and chest CT January 14, 2014  FINDINGS: There are scattered areas of mild scarring, primarily in the lower lung zones. There is no frank edema or consolidation. The heart size and pulmonary vascularity are within normal limits. Pacemaker lead tips are attached to the right atrium and right ventricle. No adenopathy. No bone lesions.  IMPRESSION: No edema or consolidation. Scattered areas of mild scarring in the lower lung zones.   Electronically Signed   By: Lowella Grip III M.D.   On: 01/13/2015 21:50   Ct Angio Chest Pe W/cm &/or Wo Cm  01/14/2015   CLINICAL DATA:  Shortness of breath starting around 4 a.m.  yesterday morning.  EXAM: CT ANGIOGRAPHY CHEST WITH CONTRAST  TECHNIQUE: Multidetector CT imaging of the chest was performed using the standard protocol during bolus administration of intravenous contrast. Multiplanar CT image reconstructions and MIPs were obtained to evaluate the vascular anatomy.  CONTRAST:  54mL OMNIPAQUE IOHEXOL 350 MG/ML SOLN  COMPARISON:  01/14/2014  FINDINGS: Technically adequate study with good opacification of the central and segmental pulmonary arteries. No focal filling defects are demonstrated. No evidence of significant pulmonary embolus.  Normal heart size. There appears to be a small apical left ventricular aneurysm containing thrombus. Coronary artery calcifications. Normal caliber thoracic aorta without aneurysm or dissection. Aortic calcification. Great vessel origins are patent. Scattered lymph nodes in the mediastinum are not pathologically enlarged. Esophagus is mildly dilated with mildly thickened wall and air-fluid level. This could indicate dysmotility, reflux, or achalasia.  Evaluation of lungs is limited due to respiratory motion artifact. There appears to be some patchy areas of airspace disease and tree-in-bud infiltrates in the lung bases, greater on the right. This may represent pneumonia or small airways disease. Airways appear patent. No focal consolidation. No pneumothorax. No pleural effusions.  Included portions of the upper abdominal organs demonstrate surgical absence of the gallbladder. Configuration of the liver suggest possible hepatic cirrhosis. Fatty infiltration of the pancreas. Degenerative changes in the spine. No destructive bone lesions.  Review of the MIP images confirms the above findings.  IMPRESSION: No evidence of significant pulmonary embolus. Apical left ventricular aneurysm containing thrombus. Dilatation of the esophagus with air-fluid level suggesting reflux or achalasia. Patchy airspace disease in the lung bases, greater on the right may  indicate pneumonia or small airways disease.   Electronically Signed   By: Lucienne Capers M.D.   On: 01/14/2015 00:49   EKG: Independently reviewed. normal sinus rhythm, nonspecific ST and T waves changes.  Assessment/Plan Principal Problem:   COPD with acute exacerbation Active Problems:   Coronary artery disease   Diabetes mellitus type II, non insulin dependent   GERD (gastroesophageal reflux disease)   CKD (chronic kidney disease) stage 3, GFR 30-59 ml/min   Automatic implantable  cardioverter-defibrillator in situ   Left ventricular apical thrombus   Acute on chronic systolic CHF (congestive heart failure)   1. COPD with acute exacerbation The patient presented with complaints of shortness of breath, cough;  he has expiratory wheezing, respiratory distress, tachypnea, with use of accessory muscles of respiration, hypoxia and mild hypercarbia with chronic respiratory acidosis.  Patient will be admitted for COPD exacerbation secondary to acute bronchitis. I will get sputum culture, urine antigens, influenza PCR. I will treat the patient with IV Solu-Medrol 60 mg Q 6hours, DUONEB every 4 hours, oxygen as needed, IV antibiotics ceftriaxone azithromycin. Mucinex as needed.   2. Possible acute on chronic CHF. The patient denies any compressive orthopnea or PND and presents with cough and shortness of breath with examination consistent with COPD flareup although he has elevated BNP and weight gain. With this he has chronic kidney disease and has received IV contrast for CT scan to rule out pulmonary embolism Therefore I will continue his home Lasix and monitor his ins and outs and daily weight and his condition does not improve then we will use IV Lasix.  3. Apical thrombus. On the CT scan The patient is found to have left ventricular aneurysm with a small thrombus. Discussed with Dr. Kirk Ruths briefly who recommended to use heparin and consult cardiology in the morning if she does not  have any contraindication. Discussed with patient as well as patient's son over the phone and started him on IV heparin for therapeutic anticoagulation. Monitor for bleeding. Echocardiogram ordered for morning.  4. GERD. Questionable achalasia. Patient does not have any GI symptoms at the time of my evaluation. We'll continue the use of PPI. Monitor closely.  5. Chronic kidney disease. The patient does have history of chronic renal disease and has received IV contrast for CT scan. With this we will recheck his BMP in the morning and at present overnight avoiding aggressive treatment for his CHF and continuing with his home treatment.  Advance goals of care discussion: Full code as per my discussion with patient and patient's son.   DVT Prophylaxis: Heparin for therapeutic anticoagulation. Nutrition: Cardiac and diabetic diet  Family Communication: Discussed the patient's son over the phone, opportunity was given to ask question and all questions were answered satisfactorily at the time of interview. Disposition: Admitted as inpatient, Telemetry unit.  Author: Berle Mull, MD [facility] Pager: [phone] 01/14/2015  If 7PM-7AM, please contact night-coverage [email or web address] Password [email or web address]

## 2015-01-14 NOTE — Progress Notes (Signed)
ANTICOAGULATION CONSULT NOTE - Follow Up Consult  Pharmacy Consult for Heparin Indication: LV Thrombus  Allergies  Allergen Reactions  . Codeine Other (See Comments)    Gets very angry, disoriented  . Dilaudid [Hydromorphone Hcl] Other (See Comments)    VERY AGITATED, HOSTILE  . Flomax [Tamsulosin Hcl] Shortness Of Breath  . Morphine And Related Other (See Comments)    VERY AGITATED, HOSTILE  . Sulfa Antibiotics Shortness Of Breath  . Carvedilol Other (See Comments)    DISORIENTATION  . Levofloxacin Other (See Comments)    unknown    Patient Measurements: Height: 5\' 10"  (177.8 cm) Weight: 190 lb 12.8 oz (86.546 kg) IBW/kg (Calculated) : 73   Vital Signs: Temp: 98.9 F (37.2 C) (06/15 0411) Temp Source: Oral (06/15 0411) BP: 118/85 mmHg (06/15 0411) Pulse Rate: 86 (06/15 0411)  Labs:  Recent Labs  01/13/15 2108 01/14/15 0405 01/14/15 0834 01/14/15 1040  HGB 12.6* 12.0*  --   --   HCT 39.9 38.4*  --   --   PLT 245 242  --   --   APTT  --  169*  --   --   LABPROT  --  17.1*  --   --   INR  --  1.39  --   --   HEPARINUNFRC  --   --   --  1.08*  CREATININE 1.70* 1.72*  --   --   TROPONINI  --  <0.03 <0.03  --     Estimated Creatinine Clearance: 36 mL/min (by C-G formula based on Cr of 1.72).   Assessment: 81 y.o. M admitted 01/13/2015  with SOB and cough. Pt found to have L venticular aneurysm with a small thrombus on CT scan. To begin heparin per cardiology recommendations.   Coag: LV thrombus, CBC stable, no bleeding noted but heparin level above goal.   Goal of Therapy:  Heparin level 0.3-0.7 units/ml Monitor platelets by anticoagulation protocol: Yes   Plan:  Hold heparin x 1h then resume at 1100 units/hr Follow up heparin level at 9pm Daily heparin level and CBC  Thank you for allowing pharmacy to be a part of this patients care team.  Rowe Robert Pharm.D., BCPS, AQ-Cardiology Clinical Pharmacist 01/14/2015 12:11 PM Pager: [phone] Phone: [phone]

## 2015-01-14 NOTE — Progress Notes (Signed)
Utilization review completed.  

## 2015-01-14 NOTE — Progress Notes (Signed)
ANTICOAGULATION CONSULT NOTE - Initial Consult  Pharmacy Consult for Heparin Indication: L ventricular thrombus  Allergies  Allergen Reactions  . Codeine Other (See Comments)    Gets very angry, disoriented  . Dilaudid [Hydromorphone Hcl] Other (See Comments)    VERY AGITATED, HOSTILE  . Flomax [Tamsulosin Hcl] Shortness Of Breath  . Morphine And Related Other (See Comments)    VERY AGITATED, HOSTILE  . Sulfa Antibiotics Shortness Of Breath  . Carvedilol Other (See Comments)    DISORIENTATION  . Levofloxacin Other (See Comments)    unknown    Patient Measurements: Height: 5\' 10"  (177.8 cm) Weight: 210 lb (95.255 kg) IBW/kg (Calculated) : 73 Heparin Dosing Weight: 95 kg  Vital Signs: Temp: 98.3 F (36.8 C) (06/15 0145) Temp Source: Oral (06/15 0145) BP: 136/57 mmHg (06/15 0145) Pulse Rate: 93 (06/15 0145)  Labs:  Recent Labs  01/13/15 2108  HGB 12.6*  HCT 39.9  PLT 245  CREATININE 1.70*    Estimated Creatinine Clearance: 40.8 mL/min (by C-G formula based on Cr of 1.7).   Medical History: Past Medical History  Diagnosis Date  . GERD (gastroesophageal reflux disease)   . Coronary artery disease     History of remote anterior MI with PCI to LAD in 2006; most recent cath 2007, no intervention required  . MI, old 2007    ANTERIOR  . Chronic systolic dysfunction of left ventricle     EF 30-35%, CLASS II - III SYMPTOMS; intolerant to Coreg  . Hyperlipidemia   . PVC's (premature ventricular contractions)   . Diabetes mellitus   . DOE (dyspnea on exertion)   . ICD (implantable cardiac defibrillator) in place August 2012  . Intolerance, drug     Intolerant to beta blockers  . Myocardial infarction   . Pneumonia   . CRF (chronic renal failure)   . BPH (benign prostatic hyperplasia)   . CKD (chronic kidney disease) stage 3, GFR 30-59 ml/min 06/08/2012  . CHF (congestive heart failure)   . COPD (chronic obstructive pulmonary disease)     Medications:   Prescriptions prior to admission  Medication Sig Dispense Refill Last Dose  . aspirin 81 MG EC tablet Take 81 mg by mouth daily.     01/12/2015 at Unknown time  . budesonide-formoterol (SYMBICORT) 160-4.5 MCG/ACT inhaler Inhale 2 puffs into the lungs 2 (two) times daily. 1 Inhaler 12 01/12/2015 at Unknown time  . Cholecalciferol (VITAMIN D PO) Take 2,000 Units by mouth daily.    01/12/2015 at Unknown time  . donepezil (ARICEPT) 10 MG tablet Take 10 mg by mouth every morning.   01/12/2015 at Unknown time  . fenofibrate (TRICOR) 145 MG tablet TAKE 1 TABLET BY MOUTH EVERY DAY 90 tablet 1 01/12/2015 at Unknown time  . furosemide (LASIX) 20 MG tablet TAKE 1 TABLET BY MOUTH EVERY DAY 90 tablet 2 01/12/2015 at Unknown time  . insulin glargine (LANTUS) 100 UNIT/ML injection Inject 24 Units into the skin every morning.    01/12/2015 at Unknown time  . losartan (COZAAR) 50 MG tablet Take 25 mg by mouth daily.   01/12/2015 at Unknown time  . nitroGLYCERIN (NITRODUR - DOSED IN MG/24 HR) 0.2 mg/hr patch PLACE 1 PATCH ONTO THE SKIN DAILY 90 patch 4 01/12/2015 at Unknown time  . pantoprazole (PROTONIX) 20 MG tablet TAKE 1 TABLET BY MOUTH EVERY DAY 90 tablet 0 01/12/2015 at Unknown time  . pravastatin (PRAVACHOL) 40 MG tablet TAKE 1 TABLET BY MOUTH EVERY EVENING 90 tablet 1  01/12/2015 at Unknown time  . PROVENTIL HFA 108 (90 BASE) MCG/ACT inhaler Inhale 2 puffs into the lungs every 6 (six) hours as needed for wheezing or shortness of breath. As directed   01/12/2015 at Unknown time  . tiotropium (SPIRIVA) 18 MCG inhalation capsule Place 1 capsule (18 mcg total) into inhaler and inhale daily. 30 capsule 6 01/12/2015 at Unknown time    Assessment: 81 y.o. male presents with SOB and cough. Pt found to have L venticular aneurysm with a small thrombus on CT scan. To begin heparin per cardiology recommendations. CBC ok at baseline.   Goal of Therapy:  Heparin level 0.3-0.7 units/ml Monitor platelets by anticoagulation  protocol: Yes   Plan:  Heparin IV bolus 4000 units Heparin gtt at 1300 units/hr Will f/u heparin level 8 hours post gtt start Daily heparin level and CBC  Sherlon Handing, PharmD, BCPS Clinical pharmacist, pager [phone] 01/14/2015,2:51 AM

## 2015-01-14 NOTE — Progress Notes (Signed)
Echocardiogram 2D Echocardiogram has been performed.  Antonio Ray 01/14/2015, 12:10 PM

## 2015-01-15 DIAGNOSIS — E119 Type 2 diabetes mellitus without complications: Secondary | ICD-10-CM

## 2015-01-15 DIAGNOSIS — K219 Gastro-esophageal reflux disease without esophagitis: Secondary | ICD-10-CM

## 2015-01-15 LAB — GLUCOSE, CAPILLARY
GLUCOSE-CAPILLARY: 267 mg/dL — AB (ref 65–99)
Glucose-Capillary: 228 mg/dL — ABNORMAL HIGH (ref 65–99)
Glucose-Capillary: 324 mg/dL — ABNORMAL HIGH (ref 65–99)

## 2015-01-15 LAB — BASIC METABOLIC PANEL
Anion gap: 9 (ref 5–15)
BUN: 41 mg/dL — AB (ref 6–20)
CO2: 27 mmol/L (ref 22–32)
CREATININE: 1.78 mg/dL — AB (ref 0.61–1.24)
Calcium: 9.3 mg/dL (ref 8.9–10.3)
Chloride: 102 mmol/L (ref 101–111)
GFR, EST AFRICAN AMERICAN: 40 mL/min — AB (ref >60)
GFR, EST NON AFRICAN AMERICAN: 35 mL/min — AB (ref >60)
Glucose, Bld: 345 mg/dL — ABNORMAL HIGH (ref 65–99)
Potassium: 4.5 mmol/L (ref 3.5–5.1)
Sodium: 138 mmol/L (ref 135–145)

## 2015-01-15 LAB — LEGIONELLA ANTIGEN, URINE

## 2015-01-15 LAB — CBC
HCT: 39.2 % (ref 39.0–52.0)
Hemoglobin: 12.7 g/dL — ABNORMAL LOW (ref 13.0–17.0)
MCH: 28.9 pg (ref 26.0–34.0)
MCHC: 32.4 g/dL (ref 30.0–36.0)
MCV: 89.1 fL (ref 78.0–100.0)
Platelets: 254 10*3/uL (ref 150–400)
RBC: 4.4 MIL/uL (ref 4.22–5.81)
RDW: 13.9 % (ref 11.5–15.5)
WBC: 17.3 10*3/uL — ABNORMAL HIGH (ref 4.0–10.5)

## 2015-01-15 LAB — HEMOGLOBIN A1C
Hgb A1c MFr Bld: 7.6 % — ABNORMAL HIGH (ref 4.8–5.6)
MEAN PLASMA GLUCOSE: 171 mg/dL

## 2015-01-15 MED ORDER — GUAIFENESIN ER 600 MG PO TB12: 600.0000 mg | ORAL_TABLET | Freq: Two times a day (BID) | ORAL | Status: DC | PRN

## 2015-01-15 MED ORDER — BENZONATATE 100 MG PO CAPS: 100.0000 mg | ORAL_CAPSULE | Freq: Three times a day (TID) | ORAL | Status: DC

## 2015-01-15 MED ORDER — PREDNISONE 10 MG PO TABS: 40.0000 mg | ORAL_TABLET | Freq: Every day | ORAL | Status: DC

## 2015-01-15 MED ORDER — AZITHROMYCIN 500 MG PO TABS: 500.0000 mg | ORAL_TABLET | Freq: Every day | ORAL | Status: DC

## 2015-01-15 NOTE — Progress Notes (Signed)
Pt was on room air and SAT 93% when i entered the room

## 2015-01-15 NOTE — Progress Notes (Signed)
Patient with discharge orders, waited for daughter to arrive to review discharge instructions. IV x 2 d/c's, Left AC IV site bleeding, pressure held x 5 minutes with good effect and bleeding stopped. Tele off and CCMD notified. Reviewed discharge instructions, follow up appointments, and medications with patient and daughter. All questions answered and prescription for antibiotic given. Patient taken out via wheel chair with all belongings via wheel chair to daughters car.

## 2015-01-15 NOTE — Discharge Summary (Signed)
Physician Discharge Summary  Antonio Ray KXF:[id number] DOB: 1934/01/09 DOA: 01/13/2015  PCP: [name], MD  Admit date: 01/13/2015 Discharge date: 01/15/2015  Time spent: 50 minutes  Recommendations for Outpatient Follow-up:  1. Bmet and BP check in 1- wk-  resume Cozaar if renal function improved  Discharge Condition: stable Diet recommendation: heart healthy, low sodium  Discharge Diagnoses:  Principal Problem:   COPD with acute exacerbation Active Problems:   Left ventricular apical thrombus- chronic   ARF (acute renal failure)   Chronic systolic CHF   Coronary artery disease   Diabetes mellitus type II, non insulin dependent   GERD (gastroesophageal reflux disease)   CKD (chronic kidney disease) stage 3, GFR 30-59 ml/min   Automatic implantable cardioverter-defibrillator in situ    History of present illness:  COPD with acute exacerbation/Acute respiratory failure - resolved - continue with nebulizer treatments and Mucinex-weaned Solu-Medrol to Prednisone  Active Problems:  Possible pneumonia -CT scan is suggestive of pneumonia versus "small airway disease"- he was started on Rocephin and Zithromax on admission-will continue Zithromax orally on d/c- allergic to Levaquin  LV thrombus? incendatal finding - noted on CT chest- see report below -started on heparin after phone discussion with on call cardiology -cardiology consult formally requested and seen by cardiology the following morning -per cardiology, who has reviewed his CT and his ECHO, it appears that he has a chronic thrombus and anticoagulation is not needed- per note from Dr Ellyn Hack "On CT it does appear to be somewhat calcified and likely layered" and "I think this is probably a chronic known finding with a layered/laminated apical thrombus and I would not treat with any anticoagulation."   Diabetes mellitus type II, non insulin dependent -Continue Lantus as he takes at home - A1c 7.6 yesterday    GERD (gastroesophageal reflux disease) -Continue Protonix  Acute renal failure on CKD (chronic kidney disease) stage 3, GFR 30-59 ml/min -Creatinine 1.2 at baseline-elevated to 1.7 currently on admission and again today -furosemide has been continued- Cozaar has been on hold   Automatic implantable cardioverter-defibrillator in situ - follows with Dr Rayann Heman  Congestive heart failure- chronic systolic  -According to notes from cardiology, patient has chronic systolic heart failure with a known EF of 30-35% and is intolerant to Coreg -Continue furosemide- cardiology did not recommend holding it despite acute renal failure   Procedures: ECHO LVEF 35-40%, anterior, anteroseptal and apical scar wtih akinesis - there is bulding of the apex consistent with aneurysm., diastolic dysfunction with elevated LV filling pressure, mild LAE, dilated IVC, pacer wires noted in the right heart.  Consultations:  cardiology  Discharge Exam: Filed Weights   01/13/15 2041 01/14/15 0145 01/15/15 0436  Weight: 95.255 kg (210 lb) 86.546 kg (190 lb 12.8 oz) 84.823 kg (187 lb)   Filed Vitals:   01/15/15 1247  BP: 142/59  Pulse: 83  Temp: 97.5 F (36.4 C)  Resp: 20    General: AAO x 3, no distress Cardiovascular: RRR, no murmurs  Respiratory: clear to auscultation bilaterally GI: soft, non-tender, non-distended, bowel sound positive  Discharge Instructions You were cared for by a hospitalist during your hospital stay. If you have any questions about your discharge medications or the care you received while you were in the hospital after you are discharged, you can call the unit and asked to speak with the hospitalist on call if the hospitalist that took care of you is not available. Once you are discharged, your primary care physician will handle any  further medical issues. Please note that NO REFILLS for any discharge medications will be authorized once you are discharged, as it is  imperative that you return to your primary care physician (or establish a relationship with a primary care physician if you do not have one) for your aftercare needs so that they can reassess your need for medications and monitor your lab values.      Discharge Instructions    Diet - low sodium heart healthy    Complete by:  As directed      Discharge instructions    Complete by:  As directed   Cozaar has been on hold - see your doctor in 1 wk to determine if it needs to be resumed     Increase activity slowly    Complete by:  As directed             Medication List    STOP taking these medications        losartan 50 MG tablet  Commonly known as:  COZAAR      TAKE these medications        aspirin 81 MG EC tablet  Take 81 mg by mouth daily.     azithromycin 500 MG tablet  Commonly known as:  ZITHROMAX  Take 1 tablet (500 mg total) by mouth daily.  Start taking on:  01/16/2015     benzonatate 100 MG capsule  Commonly known as:  TESSALON  Take 1 capsule (100 mg total) by mouth 3 (three) times daily.     budesonide-formoterol 160-4.5 MCG/ACT inhaler  Commonly known as:  SYMBICORT  Inhale 2 puffs into the lungs 2 (two) times daily.     donepezil 10 MG tablet  Commonly known as:  ARICEPT  Take 10 mg by mouth every morning.     fenofibrate 145 MG tablet  Commonly known as:  TRICOR  TAKE 1 TABLET BY MOUTH EVERY DAY     furosemide 20 MG tablet  Commonly known as:  LASIX  TAKE 1 TABLET BY MOUTH EVERY DAY     guaiFENesin 600 MG 12 hr tablet  Commonly known as:  MUCINEX  Take 1 tablet (600 mg total) by mouth 2 (two) times daily as needed for to loosen phlegm (for chest congestion).     insulin glargine 100 UNIT/ML injection  Commonly known as:  LANTUS  Inject 24 Units into the skin every morning.     nitroGLYCERIN 0.2 mg/hr patch  Commonly known as:  NITRODUR - Dosed in mg/24 hr  PLACE 1 PATCH ONTO THE SKIN DAILY     pantoprazole 20 MG tablet  Commonly known as:   PROTONIX  TAKE 1 TABLET BY MOUTH EVERY DAY     pravastatin 40 MG tablet  Commonly known as:  PRAVACHOL  TAKE 1 TABLET BY MOUTH EVERY EVENING     predniSONE 10 MG tablet  Commonly known as:  DELTASONE  Take 4 tablets (40 mg total) by mouth daily with breakfast.     PROVENTIL HFA 108 (90 BASE) MCG/ACT inhaler  Generic drug:  albuterol  Inhale 2 puffs into the lungs every 6 (six) hours as needed for wheezing or shortness of breath. As directed     tiotropium 18 MCG inhalation capsule  Commonly known as:  SPIRIVA  Place 1 capsule (18 mcg total) into inhaler and inhale daily.     VITAMIN D PO  Take 2,000 Units by mouth daily.       Allergies  Allergen Reactions  . Codeine Other (See Comments)    Gets very angry, disoriented  . Dilaudid [Hydromorphone Hcl] Other (See Comments)    VERY AGITATED, HOSTILE  . Flomax [Tamsulosin Hcl] Shortness Of Breath  . Morphine And Related Other (See Comments)    VERY AGITATED, HOSTILE  . Sulfa Antibiotics Shortness Of Breath  . Carvedilol Other (See Comments)    DISORIENTATION  . Levofloxacin Other (See Comments)    unknown      The results of significant diagnostics from this hospitalization (including imaging, microbiology, ancillary and laboratory) are listed below for reference.    Significant Diagnostic Studies: Dg Chest 2 View  01/13/2015   CLINICAL DATA:  One day history of cough and shortness of breath  EXAM: CHEST  2 VIEW  COMPARISON:  Chest radiograph January 03, 2014 and chest CT January 14, 2014  FINDINGS: There are scattered areas of mild scarring, primarily in the lower lung zones. There is no frank edema or consolidation. The heart size and pulmonary vascularity are within normal limits. Pacemaker lead tips are attached to the right atrium and right ventricle. No adenopathy. No bone lesions.  IMPRESSION: No edema or consolidation. Scattered areas of mild scarring in the lower lung zones.   Electronically Signed   By: Lowella Grip  III M.D.   On: 01/13/2015 21:50   Ct Angio Chest Pe W/cm &/or Wo Cm  01/14/2015   CLINICAL DATA:  Shortness of breath starting around 4 a.m. yesterday morning.  EXAM: CT ANGIOGRAPHY CHEST WITH CONTRAST  TECHNIQUE: Multidetector CT imaging of the chest was performed using the standard protocol during bolus administration of intravenous contrast. Multiplanar CT image reconstructions and MIPs were obtained to evaluate the vascular anatomy.  CONTRAST:  46mL OMNIPAQUE IOHEXOL 350 MG/ML SOLN  COMPARISON:  01/14/2014  FINDINGS: Technically adequate study with good opacification of the central and segmental pulmonary arteries. No focal filling defects are demonstrated. No evidence of significant pulmonary embolus.  Normal heart size. There appears to be a small apical left ventricular aneurysm containing thrombus. Coronary artery calcifications. Normal caliber thoracic aorta without aneurysm or dissection. Aortic calcification. Great vessel origins are patent. Scattered lymph nodes in the mediastinum are not pathologically enlarged. Esophagus is mildly dilated with mildly thickened wall and air-fluid level. This could indicate dysmotility, reflux, or achalasia.  Evaluation of lungs is limited due to respiratory motion artifact. There appears to be some patchy areas of airspace disease and tree-in-bud infiltrates in the lung bases, greater on the right. This may represent pneumonia or small airways disease. Airways appear patent. No focal consolidation. No pneumothorax. No pleural effusions.  Included portions of the upper abdominal organs demonstrate surgical absence of the gallbladder. Configuration of the liver suggest possible hepatic cirrhosis. Fatty infiltration of the pancreas. Degenerative changes in the spine. No destructive bone lesions.  Review of the MIP images confirms the above findings.  IMPRESSION: No evidence of significant pulmonary embolus. Apical left ventricular aneurysm containing thrombus.  Dilatation of the esophagus with air-fluid level suggesting reflux or achalasia. Patchy airspace disease in the lung bases, greater on the right may indicate pneumonia or small airways disease.   Electronically Signed   By: Lucienne Capers M.D.   On: 01/14/2015 00:49    Microbiology: Recent Results (from the past 240 hour(s))  Culture, blood (routine x 2) Call MD if unable to obtain prior to antibiotics being given     Status: None (Preliminary result)   Collection Time: 01/14/15  4:00 AM  Result Value Ref Range Status   Specimen Description BLOOD LEFT ANTECUBITAL  Final   Special Requests BOTTLES DRAWN AEROBIC ONLY 10CC  Final   Culture   Final           BLOOD CULTURE RECEIVED NO GROWTH TO DATE CULTURE WILL BE HELD FOR 5 DAYS BEFORE ISSUING A FINAL NEGATIVE REPORT Note: Culture results may be compromised due to an excessive volume of blood received in culture bottles. Performed at [facility]    Report Status PENDING  Incomplete  Culture, blood (routine x 2) Call MD if unable to obtain prior to antibiotics being given     Status: None (Preliminary result)   Collection Time: 01/14/15  4:10 AM  Result Value Ref Range Status   Specimen Description BLOOD LEFT HAND  Final   Special Requests BOTTLES DRAWN AEROBIC ONLY [place]  Final   Culture   Final           BLOOD CULTURE RECEIVED NO GROWTH TO DATE CULTURE WILL BE HELD FOR 5 DAYS BEFORE ISSUING A FINAL NEGATIVE REPORT Performed at [facility]    Report Status PENDING  Incomplete     Labs: Basic Metabolic Panel:  Recent Labs Lab 01/13/15 2108 01/14/15 0405 01/15/15 1036  NA 138 139 138  K 4.0 3.6 4.5  CL 104 102 102  CO2 26 27 27   GLUCOSE 96 263* 345*  BUN 31* 28* 41*  CREATININE 1.70* 1.72* 1.78*  CALCIUM 9.3 8.9 9.3   Liver Function Tests:  Recent Labs Lab 01/13/15 2108 01/14/15 0405  AST 27 29  ALT 25 24  ALKPHOS 37* 37*  BILITOT 0.5 0.7  PROT 6.5 6.8  ALBUMIN 3.4* 3.2*   No results for  input(s): LIPASE, AMYLASE in the last 168 hours. No results for input(s): AMMONIA in the last 168 hours. CBC:  Recent Labs Lab 01/13/15 2108 01/14/15 0405 01/15/15 1036  WBC 7.5 6.5 17.3*  NEUTROABS 5.1 5.4  --   HGB 12.6* 12.0* 12.7*  HCT 39.9 38.4* 39.2  MCV 89.1 89.5 89.1  PLT 245 242 254   Cardiac Enzymes:  Recent Labs Lab 01/14/15 0405 01/14/15 0834 01/14/15 1521  TROPONINI <0.03 <0.03 <0.03   BNP: BNP (last 3 results)  Recent Labs  01/13/15 2059  BNP 176.5*    ProBNP (last 3 results) No results for input(s): PROBNP in the last 8760 hours.  CBG:  Recent Labs Lab 01/14/15 1124 01/14/15 1634 01/14/15 2109 01/15/15 0604 01/15/15 1123  GLUCAP 208* 198* 226* 228* 324*       SignedDebbe Odea, MD [facility] 01/15/2015, 12:50 PM

## 2015-01-15 NOTE — Clinical Documentation Improvement (Addendum)
Query #1 Conflicting documentation is present in the current medical record regarding the patient's systolic heart failure - "Acute on Chronic Systolic CHF" and "Chronic Systolic Heart Failure"  The patient has received IV Lasix 20 mg IV x 1 this admission on 01/14/15.  Please clarify the Acuity of the Systolic Heart Failure monitored and treated this admission.  Query #2 "The patient presented with complaints of shortness of breath, cough;  he has expiratory wheezing, respiratory distress, tachypnea, with use of accessory muscles of respiration, hypoxia and mild hypercarbia with chronic respiratory acidosis." is documented in the H&P by Dr. Posey Pronto. Respiratory Rates were in the mid 20's and low 30's in the ED with O2 sats in the low 90's and requiried up to 3 liters 02 via nasal cannula.  Please document if a condition below provides greater specificity regarding the patient's respiratory status on admission:   - Acute Hypoxic Respiratory, resolved  - Chronic Hypoxic Respiratory  - Other Condition  - Unable to clinically determine    Thank You, Erling Conte ,RN Clinical Documentation Specialist:  [facility]

## 2015-01-19 ENCOUNTER — Encounter: Payer: Self-pay | Admitting: *Deleted

## 2015-01-19 ENCOUNTER — Ambulatory Visit (INDEPENDENT_AMBULATORY_CARE_PROVIDER_SITE_OTHER): Payer: Medicare Other | Admitting: Neurology

## 2015-01-19 ENCOUNTER — Encounter: Payer: Self-pay | Admitting: Neurology

## 2015-01-19 VITALS — BP 125/71 | HR 82 | Ht 70.0 in | Wt 186.0 lb

## 2015-01-19 DIAGNOSIS — R413 Other amnesia: Secondary | ICD-10-CM | POA: Diagnosis not present

## 2015-01-19 MED ORDER — MEMANTINE HCL 10 MG PO TABS: 10.0000 mg | ORAL_TABLET | Freq: Two times a day (BID) | ORAL | Status: DC

## 2015-01-19 NOTE — Progress Notes (Signed)
Chief Complaint  Patient presents with  . Memory Loss    MMSE 26/30 - 11 animals.  He is here with his son, Antonio Ray Ray and his daughter, Antonio Ray Ray.  He has been having worsening memory loss since January of this year.  They have seen some improvement with the addition of donepezil 79m daily.  He recently had a normal CT scan.      HISTORICAL  Antonio Ray Antonio JUNKERis a 81years old right-handed male, seen in referred by his primary care physician Dr. BDoreene Nestfor memory trouble, he is accompanied by his son Antonio Ray Ray and daughter Antonio Puttat today's clinical visit.  He had a history of hyperlipidemia,diabetes, pacemaker placement, he used to work as a [employer] just retired in 2015, he lived in the same household for 40 years, in joints. In his car,  Antonio Hastehis son, noticed patient has gradual onset memory trouble, especially since his retirement in 2015, He has difficulty remembering his password, misplace things, less confident in driving directions. He still able to drive start distance, he has difficulty to carry on complicated conversations, tends to repeat himself, word finding difficulties  I have reviewed most recent note with Dr. BDoreene Nestin Dec 12 2014, per record, Aricept was started since April 2016, mild improvement  CAT scan of the brain without contrast April 2016 showed no acute abnormality  Laboratory in April 2016, normal CBC, CMP showed elevated glucose 256, creatinine 1.65, normal TSH 1.15, B12 567  REVIEW OF SYSTEMS: Full 14 system review of systems performed and notable only for memory loss, shortness of breath, cough  ALLERGIES: Allergies  Allergen Reactions  . Codeine Other (See Comments)    Gets very angry, disoriented  . Dilaudid [Hydromorphone Hcl] Other (See Comments)    VERY AGITATED, HOSTILE  . Flomax [Tamsulosin Hcl] Shortness Of Breath  . Morphine And Related Other (See Comments)    VERY AGITATED, HOSTILE  . Sulfa Antibiotics Shortness Of Breath  . Carvedilol  Other (See Comments)    DISORIENTATION  . Levofloxacin Other (See Comments)    unknown    HOME MEDICATIONS: Current Outpatient Prescriptions  Medication Sig Dispense Refill  . aspirin 81 MG EC tablet Take 81 mg by mouth daily.      . budesonide-formoterol (SYMBICORT) 160-4.5 MCG/ACT inhaler Inhale 2 puffs into the lungs 2 (two) times daily. 1 Inhaler 12  . Cholecalciferol (VITAMIN D PO) Take 2,000 Units by mouth daily.     .Marland Kitchendonepezil (ARICEPT) 10 MG tablet Take 10 mg by mouth every morning.    . fenofibrate (TRICOR) 145 MG tablet TAKE 1 TABLET BY MOUTH EVERY DAY 90 tablet 1  . furosemide (LASIX) 20 MG tablet TAKE 1 TABLET BY MOUTH EVERY DAY 90 tablet 2  . guaiFENesin (MUCINEX) 600 MG 12 hr tablet Take 1 tablet (600 mg total) by mouth 2 (two) times daily as needed for to loosen phlegm (for chest congestion). 30 tablet 0  . insulin glargine (LANTUS) 100 UNIT/ML injection Inject 24 Units into the skin every morning.     . nitroGLYCERIN (NITRODUR - DOSED IN MG/24 HR) 0.2 mg/hr patch PLACE 1 PATCH ONTO THE SKIN DAILY 90 patch 4  . pantoprazole (PROTONIX) 20 MG tablet TAKE 1 TABLET BY MOUTH EVERY DAY 90 tablet 0  . pravastatin (PRAVACHOL) 40 MG tablet TAKE 1 TABLET BY MOUTH EVERY EVENING 90 tablet 1  . PROVENTIL HFA 108 (90 BASE) MCG/ACT inhaler Inhale 2 puffs into the lungs every 6 (six) hours as needed  for wheezing or shortness of breath. As directed    . tiotropium (SPIRIVA) 18 MCG inhalation capsule Place 1 capsule (18 mcg total) into inhaler and inhale daily. 30 capsule 6     PAST MEDICAL HISTORY: Past Medical History  Diagnosis Date  . GERD (gastroesophageal reflux disease)   . Coronary artery disease     History of remote anterior MI with PCI to LAD in 2006; most recent cath 2007, no intervention required  . Chronic systolic dysfunction of left ventricle     EF 30-35%, CLASS II - III SYMPTOMS; intolerant to Coreg  . Hyperlipidemia   . PVC's (premature ventricular contractions)     . DOE (dyspnea on exertion)   . Intolerance, drug     Intolerant to beta blockers  . BPH (benign prostatic hyperplasia)   . CHF (congestive heart failure)   . COPD (chronic obstructive pulmonary disease)   . AICD (automatic cardioverter/defibrillator) present 03/30/2011    Analyze ST study patient  . Heart murmur     hx  . Myocardial infarction 1994    ANTERIOR  . Pneumonia "several times"  . Type II diabetes mellitus   . CRF (chronic renal failure)   . CKD (chronic kidney disease) stage 3, GFR 30-59 ml/min 06/08/2012  . Skin cancer "several"    "forearms; head"  . Memory loss     PAST SURGICAL HISTORY: Past Surgical History  Procedure Laterality Date  . Foot fracture surgery Right 1980's  . Cholecystectomy  2/11  . Cataract extraction w/ intraocular lens  implant, bilateral  08/2014-09/2014  . Cardiac catheterization  01/11/2006    DEMONSTRATES AKINESIA OF THE DISTAL ANTERIOR WALL, DISTAL INFERIOR WALL AND AKINESIA OF THE APEX. THE BASAL SEGMENTS CONTRACT WELL AND OVERALL EF 35%  . Coronary angioplasty  1994    TO THE LAD  . Skin cancer excision  "several"    "forearms, head"    FAMILY HISTORY: Family History  Problem Relation Age of Onset  . Stroke Father     Mother history unknown - never met her    SOCIAL HISTORY:  History   Social History  . Marital Status: Widowed    Spouse Name: N/A  . Number of Children: 2  . Years of Education: GED   Occupational History  . 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 Smoking status: Former Smoker -- 1.00 packs/day for 35 years    Types: Cigarettes    Quit date: 08/01/1992  . Smokeless tobacco: Never Used  . Alcohol Use: 0.0 oz/week    0 Standard drinks or equivalent per week     Comment: Approximately a beer twice yearly  . Drug Use: No  . Sexual Activity: Not Currently   Other Topics Concern  . Not on file   Social History Narrative    Pt lives in [place] alone.  Widowed.   Right-handed.   Rare caffeine use - maybe one cup per month.     PHYSICAL EXAM   Filed Vitals:   01/19/15 1030  BP: 125/71  Pulse: 82  Height: 5' 10"  (1.778 m)  Weight: 186 lb (84.369 kg)    Not recorded      Body mass index is 26.69 kg/(m^2).  PHYSICAL EXAMNIATION:  Gen: NAD, conversant, well nourised, obese, well groomed  Cardiovascular: Regular rate rhythm, no peripheral edema, warm, nontender. Eyes: Conjunctivae clear without exudates or hemorrhage Neck: Supple, no carotid bruise. Pulmonary: Clear to auscultation bilaterally   NEUROLOGICAL EXAM:  MENTAL STATUS: Speech:    Speech is normal; fluent and spontaneous with normal comprehension.  Cognition:  Mini-Mental Status Examination is 26 out of 30, he is not oriented to date, clinic, missed one out of 3 recall, could not copy figure. animal naming was 11  CRANIAL NERVES: CN II: Visual fields are full to confrontation. Fundoscopic exam is normal with sharp discs and no vascular changes. Venous pulsations are present bilaterally. Pupils are 4 mm and briskly reactive to light. Visual acuity is 20/20 bilaterally. CN III, IV, VI: extraocular movement are normal. No ptosis. CN V: Facial sensation is intact to pinprick in all 3 divisions bilaterally. Corneal responses are intact.  CN VII: Face is symmetric with normal eye closure and smile. CN VIII: Hearing is normal to rubbing fingers CN IX, X: Palate elevates symmetrically. Phonation is normal. CN XI: Head turning and shoulder shrug are intact CN XII: Tongue is midline with normal movements and no atrophy.  MOTOR: There is no pronator drift of out-stretched arms. Muscle bulk and tone are normal. Muscle strength is normal.  REFLEXES: Reflexes are 2+ and symmetric at the biceps, triceps, knees, and ankles. Plantar responses are flexor.  SENSORY: Mild length dependent decreased light touch  pinprick  COORDINATION: Rapid alternating movements and fine finger movements are intact. There is no dysmetria on finger-to-nose and heel-knee-shin. There are no abnormal or extraneous movements.   GAIT/STANCE: Steady gait   DIAGNOSTIC DATA (LABS, IMAGING, TESTING) - I reviewed patient records, labs, notes, testing and imaging myself where available.  Lab Results  Component Value Date   WBC 17.3* 01/15/2015   HGB 12.7* 01/15/2015   HCT 39.2 01/15/2015   MCV 89.1 01/15/2015   PLT 254 01/15/2015      Component Value Date/Time   NA 138 01/15/2015 1036   K 4.5 01/15/2015 1036   CL 102 01/15/2015 1036   CO2 27 01/15/2015 1036   GLUCOSE 345* 01/15/2015 1036   BUN 41* 01/15/2015 1036   CREATININE 1.78* 01/15/2015 1036   CREATININE 1.89* 05/19/2012 1046   CALCIUM 9.3 01/15/2015 1036   PROT 6.8 01/14/2015 0405   ALBUMIN 3.2* 01/14/2015 0405   AST 29 01/14/2015 0405   ALT 24 01/14/2015 0405   ALKPHOS 37* 01/14/2015 0405   BILITOT 0.7 01/14/2015 0405   GFRNONAA 35* 01/15/2015 1036   GFRAA 40* 01/15/2015 1036   Lab Results  Component Value Date   CHOL 136 06/20/2013   HDL 32.00* 06/20/2013   LDLCALC 76 06/20/2013   TRIG 142.0 06/20/2013   CHOLHDL 4 06/20/2013   Lab Results  Component Value Date    ASSESSMENT AND PLAN  Antonio Ray Ray is a 81 y.o. male with history of gradual onset memory trouble, Mini-Mental Status Examination is 26 out of 30  1, mild cognitive impairment, started Namenda 10 mg twice a day 2, encouraging moderate exercise, return to clinic in 3 months.  No orders of the defined types were placed in this encounter.    New Prescriptions   MEMANTINE (NAMENDA) 10 MG TABLET    Take 1 tablet (10 mg total) by mouth 2 (two) times daily.    Return in about 3 months (around 04/21/2015). Marcial Pacas, M.D. Ph.D.  [facility] [address], [place], [place] [address] Ph: [phone] Fax: [phone]

## 2015-01-20 LAB — CULTURE, BLOOD (ROUTINE X 2)
Culture: NO GROWTH
Culture: NO GROWTH

## 2015-01-21 ENCOUNTER — Other Ambulatory Visit: Payer: Self-pay | Admitting: Cardiology

## 2015-01-26 ENCOUNTER — Other Ambulatory Visit: Payer: Self-pay

## 2015-01-27 DIAGNOSIS — Z0000 Encounter for general adult medical examination without abnormal findings: Secondary | ICD-10-CM | POA: Diagnosis not present

## 2015-01-27 DIAGNOSIS — E1142 Type 2 diabetes mellitus with diabetic polyneuropathy: Secondary | ICD-10-CM | POA: Diagnosis not present

## 2015-01-27 DIAGNOSIS — I5022 Chronic systolic (congestive) heart failure: Secondary | ICD-10-CM | POA: Diagnosis not present

## 2015-01-27 DIAGNOSIS — F0390 Unspecified dementia without behavioral disturbance: Secondary | ICD-10-CM | POA: Diagnosis not present

## 2015-02-07 ENCOUNTER — Emergency Department (HOSPITAL_COMMUNITY): Payer: Medicare Other

## 2015-02-07 ENCOUNTER — Encounter (HOSPITAL_COMMUNITY): Payer: Self-pay | Admitting: Emergency Medicine

## 2015-02-07 ENCOUNTER — Inpatient Hospital Stay (HOSPITAL_COMMUNITY)
Admission: EM | Admit: 2015-02-07 | Discharge: 2015-02-10 | DRG: 178 | Disposition: A | Discharge status: Home / Self Care | Payer: Medicare Other | Attending: Internal Medicine | Admitting: Internal Medicine

## 2015-02-07 DIAGNOSIS — Z882 Allergy status to sulfonamides status: Secondary | ICD-10-CM

## 2015-02-07 DIAGNOSIS — Z794 Long term (current) use of insulin: Secondary | ICD-10-CM | POA: Diagnosis not present

## 2015-02-07 DIAGNOSIS — Z7982 Long term (current) use of aspirin: Secondary | ICD-10-CM

## 2015-02-07 DIAGNOSIS — Z881 Allergy status to other antibiotic agents status: Secondary | ICD-10-CM

## 2015-02-07 DIAGNOSIS — R1310 Dysphagia, unspecified: Secondary | ICD-10-CM | POA: Diagnosis present

## 2015-02-07 DIAGNOSIS — J690 Pneumonitis due to inhalation of food and vomit: Principal | ICD-10-CM | POA: Insufficient documentation

## 2015-02-07 DIAGNOSIS — N400 Enlarged prostate without lower urinary tract symptoms: Secondary | ICD-10-CM | POA: Diagnosis present

## 2015-02-07 DIAGNOSIS — R0602 Shortness of breath: Secondary | ICD-10-CM | POA: Diagnosis not present

## 2015-02-07 DIAGNOSIS — Z87891 Personal history of nicotine dependence: Secondary | ICD-10-CM | POA: Diagnosis not present

## 2015-02-07 DIAGNOSIS — R069 Unspecified abnormalities of breathing: Secondary | ICD-10-CM | POA: Diagnosis not present

## 2015-02-07 DIAGNOSIS — K222 Esophageal obstruction: Secondary | ICD-10-CM | POA: Diagnosis present

## 2015-02-07 DIAGNOSIS — F0390 Unspecified dementia without behavioral disturbance: Secondary | ICD-10-CM | POA: Diagnosis present

## 2015-02-07 DIAGNOSIS — J449 Chronic obstructive pulmonary disease, unspecified: Secondary | ICD-10-CM | POA: Diagnosis present

## 2015-02-07 DIAGNOSIS — K219 Gastro-esophageal reflux disease without esophagitis: Secondary | ICD-10-CM | POA: Diagnosis present

## 2015-02-07 DIAGNOSIS — Z95810 Presence of automatic (implantable) cardiac defibrillator: Secondary | ICD-10-CM | POA: Diagnosis present

## 2015-02-07 DIAGNOSIS — E785 Hyperlipidemia, unspecified: Secondary | ICD-10-CM | POA: Diagnosis present

## 2015-02-07 DIAGNOSIS — Z85828 Personal history of other malignant neoplasm of skin: Secondary | ICD-10-CM

## 2015-02-07 DIAGNOSIS — N183 Chronic kidney disease, stage 3 unspecified: Secondary | ICD-10-CM | POA: Diagnosis present

## 2015-02-07 DIAGNOSIS — R4702 Dysphasia: Secondary | ICD-10-CM | POA: Diagnosis present

## 2015-02-07 DIAGNOSIS — Z9049 Acquired absence of other specified parts of digestive tract: Secondary | ICD-10-CM | POA: Diagnosis present

## 2015-02-07 DIAGNOSIS — I429 Cardiomyopathy, unspecified: Secondary | ICD-10-CM | POA: Diagnosis present

## 2015-02-07 DIAGNOSIS — J189 Pneumonia, unspecified organism: Secondary | ICD-10-CM

## 2015-02-07 DIAGNOSIS — Z886 Allergy status to analgesic agent status: Secondary | ICD-10-CM | POA: Diagnosis not present

## 2015-02-07 DIAGNOSIS — Z9842 Cataract extraction status, left eye: Secondary | ICD-10-CM

## 2015-02-07 DIAGNOSIS — I252 Old myocardial infarction: Secondary | ICD-10-CM | POA: Diagnosis not present

## 2015-02-07 DIAGNOSIS — K224 Dyskinesia of esophagus: Secondary | ICD-10-CM | POA: Insufficient documentation

## 2015-02-07 DIAGNOSIS — E119 Type 2 diabetes mellitus without complications: Secondary | ICD-10-CM | POA: Diagnosis not present

## 2015-02-07 DIAGNOSIS — R1314 Dysphagia, pharyngoesophageal phase: Secondary | ICD-10-CM | POA: Diagnosis not present

## 2015-02-07 DIAGNOSIS — J984 Other disorders of lung: Secondary | ICD-10-CM | POA: Diagnosis not present

## 2015-02-07 DIAGNOSIS — Z961 Presence of intraocular lens: Secondary | ICD-10-CM | POA: Diagnosis present

## 2015-02-07 DIAGNOSIS — E1122 Type 2 diabetes mellitus with diabetic chronic kidney disease: Secondary | ICD-10-CM | POA: Diagnosis present

## 2015-02-07 DIAGNOSIS — I2510 Atherosclerotic heart disease of native coronary artery without angina pectoris: Secondary | ICD-10-CM | POA: Diagnosis present

## 2015-02-07 DIAGNOSIS — Z9841 Cataract extraction status, right eye: Secondary | ICD-10-CM | POA: Diagnosis not present

## 2015-02-07 DIAGNOSIS — I5022 Chronic systolic (congestive) heart failure: Secondary | ICD-10-CM | POA: Diagnosis not present

## 2015-02-07 DIAGNOSIS — I5043 Acute on chronic combined systolic (congestive) and diastolic (congestive) heart failure: Secondary | ICD-10-CM | POA: Diagnosis present

## 2015-02-07 DIAGNOSIS — R1319 Other dysphagia: Secondary | ICD-10-CM | POA: Insufficient documentation

## 2015-02-07 HISTORY — DX: Cardiac arrhythmia, unspecified: I49.9

## 2015-02-07 LAB — CBC WITH DIFFERENTIAL/PLATELET
BASOS ABS: 0 10*3/uL (ref 0.0–0.1)
Basophils Relative: 0 % (ref 0–1)
EOS ABS: 0 10*3/uL (ref 0.0–0.7)
Eosinophils Relative: 0 % (ref 0–5)
HCT: 40.5 % (ref 39.0–52.0)
Hemoglobin: 12.7 g/dL — ABNORMAL LOW (ref 13.0–17.0)
LYMPHS ABS: 0.9 10*3/uL (ref 0.7–4.0)
Lymphocytes Relative: 8 % — ABNORMAL LOW (ref 12–46)
MCH: 27.8 pg (ref 26.0–34.0)
MCHC: 31.4 g/dL (ref 30.0–36.0)
MCV: 88.6 fL (ref 78.0–100.0)
Monocytes Absolute: 0.7 10*3/uL (ref 0.1–1.0)
Monocytes Relative: 6 % (ref 3–12)
NEUTROS PCT: 86 % — AB (ref 43–77)
Neutro Abs: 10 10*3/uL — ABNORMAL HIGH (ref 1.7–7.7)
PLATELETS: 211 10*3/uL (ref 150–400)
RBC: 4.57 MIL/uL (ref 4.22–5.81)
RDW: 13.9 % (ref 11.5–15.5)
WBC: 11.7 10*3/uL — ABNORMAL HIGH (ref 4.0–10.5)

## 2015-02-07 LAB — URINALYSIS, ROUTINE W REFLEX MICROSCOPIC
Bilirubin Urine: NEGATIVE
Glucose, UA: NEGATIVE mg/dL
Hgb urine dipstick: NEGATIVE
KETONES UR: NEGATIVE mg/dL
LEUKOCYTES UA: NEGATIVE
NITRITE: NEGATIVE
PH: 6 (ref 5.0–8.0)
PROTEIN: NEGATIVE mg/dL
Specific Gravity, Urine: 1.019 (ref 1.005–1.030)
Urobilinogen, UA: 1 mg/dL (ref 0.0–1.0)

## 2015-02-07 LAB — BASIC METABOLIC PANEL
Anion gap: 8 (ref 5–15)
BUN: 30 mg/dL — AB (ref 6–20)
CALCIUM: 9 mg/dL (ref 8.9–10.3)
CO2: 28 mmol/L (ref 22–32)
Chloride: 104 mmol/L (ref 101–111)
Creatinine, Ser: 1.8 mg/dL — ABNORMAL HIGH (ref 0.61–1.24)
GFR calc Af Amer: 40 mL/min — ABNORMAL LOW (ref >60)
GFR calc non Af Amer: 34 mL/min — ABNORMAL LOW (ref >60)
GLUCOSE: 149 mg/dL — AB (ref 65–99)
Potassium: 4 mmol/L (ref 3.5–5.1)
Sodium: 140 mmol/L (ref 135–145)

## 2015-02-07 LAB — GLUCOSE, CAPILLARY
Glucose-Capillary: 183 mg/dL — ABNORMAL HIGH (ref 65–99)
Glucose-Capillary: 246 mg/dL — ABNORMAL HIGH (ref 65–99)

## 2015-02-07 LAB — TROPONIN I: Troponin I: <0.03 ng/mL (ref <0.031)

## 2015-02-07 LAB — BRAIN NATRIURETIC PEPTIDE: B Natriuretic Peptide: 131.2 pg/mL — ABNORMAL HIGH (ref 0.0–100.0)

## 2015-02-07 MED ORDER — ACETAMINOPHEN 650 MG RE SUPP: 650.0000 mg | Freq: Four times a day (QID) | RECTAL | Status: DC | PRN

## 2015-02-07 MED ORDER — ONDANSETRON HCL 4 MG/2ML IJ SOLN: 4.0000 mg | Freq: Four times a day (QID) | INTRAMUSCULAR | Status: DC | PRN

## 2015-02-07 MED ORDER — VANCOMYCIN HCL IN DEXTROSE 1-5 GM/200ML-% IV SOLN
1000.0000 mg | Freq: Once | INTRAVENOUS | Status: AC
  Administered 2015-02-07: 1000 mg via INTRAVENOUS
  Filled 2015-02-07: qty 200

## 2015-02-07 MED ORDER — GUAIFENESIN ER 600 MG PO TB12
600.0000 mg | ORAL_TABLET | Freq: Two times a day (BID) | ORAL | Status: DC
  Administered 2015-02-07 – 2015-02-09 (×5): 600 mg via ORAL
  Filled 2015-02-07 (×7): qty 1

## 2015-02-07 MED ORDER — TIOTROPIUM BROMIDE MONOHYDRATE 18 MCG IN CAPS
18.0000 ug | ORAL_CAPSULE | Freq: Every day | RESPIRATORY_TRACT | Status: DC
  Administered 2015-02-08 – 2015-02-10 (×3): 18 ug via RESPIRATORY_TRACT
  Filled 2015-02-07: qty 5

## 2015-02-07 MED ORDER — PANTOPRAZOLE SODIUM 20 MG PO TBEC
20.0000 mg | DELAYED_RELEASE_TABLET | Freq: Every day | ORAL | Status: DC
  Administered 2015-02-08 – 2015-02-09 (×2): 20 mg via ORAL
  Filled 2015-02-07 (×3): qty 1

## 2015-02-07 MED ORDER — PIPERACILLIN-TAZOBACTAM IN DEX 2-0.25 GM/50ML IV SOLN
2.2500 g | Freq: Once | INTRAVENOUS | Status: AC
  Administered 2015-02-07: 2.25 g via INTRAVENOUS
  Filled 2015-02-07: qty 50

## 2015-02-07 MED ORDER — ALBUTEROL SULFATE (2.5 MG/3ML) 0.083% IN NEBU
2.5000 mg | INHALATION_SOLUTION | Freq: Four times a day (QID) | RESPIRATORY_TRACT | Status: DC | PRN
  Administered 2015-02-08: 2.5 mg via RESPIRATORY_TRACT

## 2015-02-07 MED ORDER — SODIUM CHLORIDE 0.9 % IJ SOLN
3.0000 mL | Freq: Two times a day (BID) | INTRAMUSCULAR | Status: DC
  Administered 2015-02-08 – 2015-02-09 (×3): 3 mL via INTRAVENOUS

## 2015-02-07 MED ORDER — ONDANSETRON HCL 4 MG PO TABS: 4.0000 mg | ORAL_TABLET | Freq: Four times a day (QID) | ORAL | Status: DC | PRN

## 2015-02-07 MED ORDER — FUROSEMIDE 20 MG PO TABS
20.0000 mg | ORAL_TABLET | Freq: Every day | ORAL | Status: DC
  Administered 2015-02-08 – 2015-02-09 (×2): 20 mg via ORAL
  Filled 2015-02-07 (×3): qty 1

## 2015-02-07 MED ORDER — ACETAMINOPHEN 325 MG PO TABS: 650.0000 mg | ORAL_TABLET | Freq: Four times a day (QID) | ORAL | Status: DC | PRN

## 2015-02-07 MED ORDER — ASPIRIN EC 81 MG PO TBEC
81.0000 mg | DELAYED_RELEASE_TABLET | Freq: Every day | ORAL | Status: DC
  Administered 2015-02-08 – 2015-02-09 (×2): 81 mg via ORAL
  Filled 2015-02-07 (×3): qty 1

## 2015-02-07 MED ORDER — ASPIRIN 81 MG PO TBEC: 81.0000 mg | DELAYED_RELEASE_TABLET | Freq: Every day | ORAL | Status: DC

## 2015-02-07 MED ORDER — INSULIN ASPART 100 UNIT/ML ~~LOC~~ SOLN
0.0000 [IU] | Freq: Three times a day (TID) | SUBCUTANEOUS | Status: DC
  Administered 2015-02-08 (×2): 3 [IU] via SUBCUTANEOUS
  Administered 2015-02-09: 1 [IU] via SUBCUTANEOUS

## 2015-02-07 MED ORDER — SODIUM CHLORIDE 0.9 % IV SOLN
1250.0000 mg | INTRAVENOUS | Status: DC
  Filled 2015-02-07: qty 1250

## 2015-02-07 MED ORDER — INSULIN GLARGINE 100 UNIT/ML ~~LOC~~ SOLN
24.0000 [IU] | Freq: Every morning | SUBCUTANEOUS | Status: DC
  Administered 2015-02-08 – 2015-02-10 (×3): 24 [IU] via SUBCUTANEOUS
  Filled 2015-02-07 (×3): qty 0.24

## 2015-02-07 MED ORDER — BUDESONIDE-FORMOTEROL FUMARATE 160-4.5 MCG/ACT IN AERO
2.0000 | INHALATION_SPRAY | Freq: Two times a day (BID) | RESPIRATORY_TRACT | Status: DC
  Administered 2015-02-07 – 2015-02-10 (×6): 2 via RESPIRATORY_TRACT
  Filled 2015-02-07: qty 6

## 2015-02-07 MED ORDER — SODIUM CHLORIDE 0.9 % IV SOLN: 250.0000 mL | INTRAVENOUS | Status: DC | PRN

## 2015-02-07 MED ORDER — PIPERACILLIN-TAZOBACTAM 3.375 G IVPB
3.3750 g | Freq: Three times a day (TID) | INTRAVENOUS | Status: DC
  Administered 2015-02-07 – 2015-02-10 (×9): 3.375 g via INTRAVENOUS
  Filled 2015-02-07 (×12): qty 50

## 2015-02-07 MED ORDER — SODIUM CHLORIDE 0.9 % IJ SOLN
3.0000 mL | Freq: Two times a day (BID) | INTRAMUSCULAR | Status: DC
  Administered 2015-02-07 – 2015-02-08 (×2): 3 mL via INTRAVENOUS

## 2015-02-07 MED ORDER — MEMANTINE HCL 10 MG PO TABS
10.0000 mg | ORAL_TABLET | Freq: Two times a day (BID) | ORAL | Status: DC
  Administered 2015-02-07 – 2015-02-09 (×5): 10 mg via ORAL
  Filled 2015-02-07 (×7): qty 1

## 2015-02-07 MED ORDER — DONEPEZIL HCL 10 MG PO TABS
10.0000 mg | ORAL_TABLET | Freq: Every morning | ORAL | Status: DC
  Administered 2015-02-08 – 2015-02-09 (×2): 10 mg via ORAL
  Filled 2015-02-07 (×3): qty 1

## 2015-02-07 MED ORDER — ALBUTEROL SULFATE (2.5 MG/3ML) 0.083% IN NEBU
2.5000 mg | INHALATION_SOLUTION | Freq: Four times a day (QID) | RESPIRATORY_TRACT | Status: DC
  Administered 2015-02-08 (×2): 2.5 mg via RESPIRATORY_TRACT
  Filled 2015-02-07 (×3): qty 3

## 2015-02-07 MED ORDER — SENNOSIDES-DOCUSATE SODIUM 8.6-50 MG PO TABS
1.0000 | ORAL_TABLET | Freq: Every evening | ORAL | Status: DC | PRN
  Filled 2015-02-07: qty 1

## 2015-02-07 MED ORDER — ALBUTEROL SULFATE HFA 108 (90 BASE) MCG/ACT IN AERS: 2.0000 | INHALATION_SPRAY | Freq: Four times a day (QID) | RESPIRATORY_TRACT | Status: DC | PRN

## 2015-02-07 MED ORDER — FENOFIBRATE 160 MG PO TABS
160.0000 mg | ORAL_TABLET | Freq: Every day | ORAL | Status: DC
  Administered 2015-02-08 – 2015-02-09 (×2): 160 mg via ORAL
  Filled 2015-02-07 (×3): qty 1

## 2015-02-07 MED ORDER — VITAMIN D3 25 MCG (1000 UNIT) PO TABS
2000.0000 [IU] | ORAL_TABLET | Freq: Every day | ORAL | Status: DC
  Administered 2015-02-08 – 2015-02-09 (×2): 2000 [IU] via ORAL
  Filled 2015-02-07 (×3): qty 2

## 2015-02-07 MED ORDER — ENOXAPARIN SODIUM 30 MG/0.3ML ~~LOC~~ SOLN
30.0000 mg | SUBCUTANEOUS | Status: DC
  Administered 2015-02-07: 30 mg via SUBCUTANEOUS
  Filled 2015-02-07 (×2): qty 0.3

## 2015-02-07 MED ORDER — PRAVASTATIN SODIUM 40 MG PO TABS
40.0000 mg | ORAL_TABLET | Freq: Every evening | ORAL | Status: DC
  Administered 2015-02-07 – 2015-02-10 (×4): 40 mg via ORAL
  Filled 2015-02-07 (×4): qty 1

## 2015-02-07 MED ORDER — SODIUM CHLORIDE 0.9 % IJ SOLN: 3.0000 mL | INTRAMUSCULAR | Status: DC | PRN

## 2015-02-07 NOTE — ED Notes (Signed)
Pharmacy aware that they need to verify medication before i can pull it from pyxis.

## 2015-02-07 NOTE — ED Notes (Signed)
Pt IV infiltrated. This RN aware- IV removed. Cold compress appllied per pharmacy instructions. Pharmacy instructed to apply cold compress for 30 mins q4h for 24 h. Pt family aware of instructions.

## 2015-02-07 NOTE — Progress Notes (Signed)
ANTIBIOTIC CONSULT NOTE - INITIAL  Pharmacy Consult for Vanco/Zosyn Indication: pneumonia  Allergies  Allergen Reactions  . Codeine Other (See Comments)    Gets very angry, disoriented  . Dilaudid [Hydromorphone Hcl] Other (See Comments)    VERY AGITATED, HOSTILE  . Flomax [Tamsulosin Hcl] Shortness Of Breath  . Morphine And Related Other (See Comments)    VERY AGITATED, HOSTILE  . Sulfa Antibiotics Shortness Of Breath  . Carvedilol Other (See Comments)    DISORIENTATION  . Levofloxacin Other (See Comments)    unknown    Patient Measurements:   Adjusted Body Weight:    Vital Signs: Temp: 100.5 F (38.1 C) (07/09 1259) Temp Source: Rectal (07/09 1259) BP: 114/55 mmHg (07/09 1545) Pulse Rate: 80 (07/09 1545) Intake/Output from previous day:   Intake/Output from this shift: Total I/O In: 100 [IV Piggyback:100] Out: -   Labs:  Recent Labs  02/07/15 1300  WBC 11.7*  HGB 12.7*  PLT 211  CREATININE 1.80*   CrCl cannot be calculated (Unknown ideal weight.). No results for input(s): VANCOTROUGH, VANCOPEAK, VANCORANDOM, GENTTROUGH, GENTPEAK, GENTRANDOM, TOBRATROUGH, TOBRAPEAK, TOBRARND, AMIKACINPEAK, AMIKACINTROU, AMIKACIN in the last 72 hours.   Microbiology:   Medical History: Past Medical History  Diagnosis Date  . GERD (gastroesophageal reflux disease)   . Coronary artery disease     History of remote anterior MI with PCI to LAD in 2006; most recent cath 2007, no intervention required  . Chronic systolic dysfunction of left ventricle     EF 30-35%, CLASS II - III SYMPTOMS; intolerant to Coreg  . Hyperlipidemia   . PVC's (premature ventricular contractions)   . DOE (dyspnea on exertion)   . Intolerance, drug     Intolerant to beta blockers  . BPH (benign prostatic hyperplasia)   . CHF (congestive heart failure)   . COPD (chronic obstructive pulmonary disease)   . AICD (automatic cardioverter/defibrillator) present 03/30/2011    Analyze ST study patient   . Heart murmur     hx  . Myocardial infarction 1994    ANTERIOR  . Pneumonia "several times"  . Type II diabetes mellitus   . CRF (chronic renal failure)   . CKD (chronic kidney disease) stage 3, GFR 30-59 ml/min 06/08/2012  . Skin cancer "several"    "forearms; head"  . Memory loss     Medications:  Prescriptions prior to admission  Medication Sig Dispense Refill Last Dose  . aspirin 81 MG EC tablet Take 81 mg by mouth daily.     02/07/2015 at Unknown time  . budesonide-formoterol (SYMBICORT) 160-4.5 MCG/ACT inhaler Inhale 2 puffs into the lungs 2 (two) times daily. 1 Inhaler 12 02/07/2015 at Unknown time  . Cholecalciferol (VITAMIN D PO) Take 2,000 Units by mouth daily.    02/07/2015 at Unknown time  . donepezil (ARICEPT) 10 MG tablet Take 10 mg by mouth every morning.   02/07/2015 at Unknown time  . fenofibrate (TRICOR) 145 MG tablet TAKE 1 TABLET BY MOUTH EVERY DAY 30 tablet 6 02/07/2015 at Unknown time  . furosemide (LASIX) 20 MG tablet TAKE 1 TABLET BY MOUTH EVERY DAY 90 tablet 2 02/07/2015 at Unknown time  . guaiFENesin (MUCINEX) 600 MG 12 hr tablet Take 1 tablet (600 mg total) by mouth 2 (two) times daily as needed for to loosen phlegm (for chest congestion). 30 tablet 0 Past Month at Unknown time  . insulin glargine (LANTUS) 100 UNIT/ML injection Inject 24 Units into the skin every morning.    02/07/2015 at  Unknown time  . losartan (COZAAR) 25 MG tablet Take 25 mg by mouth daily.  1 02/07/2015 at Unknown time  . memantine (NAMENDA) 10 MG tablet Take 1 tablet (10 mg total) by mouth 2 (two) times daily. 60 tablet 11 02/07/2015 at Unknown time  . Multiple Vitamins-Minerals (PRESERVISION AREDS 2) CAPS Take 1 capsule by mouth 2 (two) times daily.   02/07/2015 at Unknown time  . nitroGLYCERIN (NITRODUR - DOSED IN MG/24 HR) 0.2 mg/hr patch PLACE 1 PATCH ONTO THE SKIN DAILY 90 patch 4 02/07/2015 at Unknown time  . pantoprazole (PROTONIX) 20 MG tablet TAKE 1 TABLET BY MOUTH EVERY DAY 90 tablet 0 02/07/2015 at  Unknown time  . pravastatin (PRAVACHOL) 40 MG tablet TAKE 1 TABLET BY MOUTH EVERY EVENING 90 tablet 1 02/06/2015 at Unknown time  . PROVENTIL HFA 108 (90 BASE) MCG/ACT inhaler Inhale 2 puffs into the lungs every 6 (six) hours as needed for wheezing or shortness of breath. As directed   NOT USED  . tiotropium (SPIRIVA) 18 MCG inhalation capsule Place 1 capsule (18 mcg total) into inhaler and inhale daily. 30 capsule 6 02/07/2015 at Unknown time   Assessment: SOB 81 y/o M with significant PMH including a h/o CHF, COPD, recent PNA, and CKD with recent hospitalization (d/c 01/15/15) presents with SOB. Start abx to cover for aspiration PNA. Tmax 100.5. WBC 11.7. Scr 1.8 (est CrCl 39)  Goal of Therapy:  Vancomycin trough level 15-20 mcg/ml  Plan:  Zosyn 3.375g IV q8hr. (2.25g IV given in ED 7/9 at 1600) Vancomycin 1250mg  IV q24h. (1g given in ED 7/9 at 1600) Vanco trough after 3-5 doses at steady state.   Darby Shadwick S. Alford Highland, PharmD, BCPS Clinical Staff Pharmacist Pager [phone]  Eilene Ghazi Stillinger 02/07/2015,4:55 PM

## 2015-02-07 NOTE — ED Provider Notes (Signed)
CSN: [id number]     Arrival date & time 02/07/15  1225 History   First MD Initiated Contact with Patient 02/07/15 1228     Chief Complaint  Patient presents with  . Fever  . Shortness of Breath     (Consider location/radiation/quality/duration/timing/severity/associated sxs/prior Treatment) HPI Comments: Pt comes in with c/o fever and sob that started this morning. Pt states that he was feeling dizzy and sob this morning and he called his son. Son states that when he got there his dad was having shaking chills and generalized weakness. Pt was hospitalized on 6/14 for pneumonia and he has continued to have a cough but  Has not had fever. Denies cp or swelling to extremity. Pt has started on nameda in the last month and was having some dizziness associated with that but when they changed it to once a day he has been doing better.   The history is provided by the patient and a relative. No language interpreter was used.    Past Medical History  Diagnosis Date  . GERD (gastroesophageal reflux disease)   . Coronary artery disease     History of remote anterior MI with PCI to LAD in 2006; most recent cath 2007, no intervention required  . Chronic systolic dysfunction of left ventricle     EF 30-35%, CLASS II - III SYMPTOMS; intolerant to Coreg  . Hyperlipidemia   . PVC's (premature ventricular contractions)   . DOE (dyspnea on exertion)   . Intolerance, drug     Intolerant to beta blockers  . BPH (benign prostatic hyperplasia)   . CHF (congestive heart failure)   . COPD (chronic obstructive pulmonary disease)   . AICD (automatic cardioverter/defibrillator) present 03/30/2011    Analyze ST study patient  . Heart murmur     hx  . Myocardial infarction 1994    ANTERIOR  . Pneumonia "several times"  . Type II diabetes mellitus   . CRF (chronic renal failure)   . CKD (chronic kidney disease) stage 3, GFR 30-59 ml/min 06/08/2012  . Skin cancer "several"    "forearms; head"  . Memory loss     Past Surgical History  Procedure Laterality Date  . Foot fracture surgery Right 1980's  . Cholecystectomy  2/11  . Cataract extraction w/ intraocular lens  implant, bilateral  08/2014-09/2014  . Cardiac catheterization  01/11/2006    DEMONSTRATES AKINESIA OF THE DISTAL ANTERIOR WALL, DISTAL INFERIOR WALL AND AKINESIA OF THE APEX. THE BASAL SEGMENTS CONTRACT WELL AND OVERALL EF 35%  . Coronary angioplasty  1994    TO THE LAD  . Skin cancer excision  "several"    "forearms, head"   Family History  Problem Relation Age of Onset  . Stroke Father     Mother history unknown - never met her   History  Substance Use Topics  . Smoking status: Former Smoker -- 1.00 packs/day for 35 years    Types: Cigarettes    Quit date: 08/01/1992  . Smokeless tobacco: Never Used  . Alcohol Use: 0.0 oz/week    0 Standard drinks or equivalent per week     Comment: Approximately a beer twice yearly    Review of Systems  All other systems reviewed and are negative.     Allergies  Codeine; Dilaudid; Flomax; Morphine and related; Sulfa antibiotics; Carvedilol; and Levofloxacin  Home Medications   Prior to Admission medications   Medication Sig Start Date End Date Taking? Authorizing Provider  aspirin 81 MG EC  tablet Take 81 mg by mouth daily.      Historical Provider, MD  budesonide-formoterol (SYMBICORT) 160-4.5 MCG/ACT inhaler Inhale 2 puffs into the lungs 2 (two) times daily. 09/19/14   Juanito Doom, MD  Cholecalciferol (VITAMIN D PO) Take 2,000 Units by mouth daily.     Historical Provider, MD  donepezil (ARICEPT) 10 MG tablet Take 10 mg by mouth every morning.    Historical Provider, MD  fenofibrate (TRICOR) 145 MG tablet TAKE 1 TABLET BY MOUTH EVERY DAY 01/22/15   Peter M Martinique, MD  furosemide (LASIX) 20 MG tablet TAKE 1 TABLET BY MOUTH EVERY DAY 09/19/14   Peter M Martinique, MD  guaiFENesin (MUCINEX) 600 MG 12 hr tablet Take 1 tablet (600 mg total) by mouth 2 (two) times daily as needed for  to loosen phlegm (for chest congestion). 01/15/15   Debbe Odea, MD  insulin glargine (LANTUS) 100 UNIT/ML injection Inject 24 Units into the skin every morning.     Historical Provider, MD  memantine (NAMENDA) 10 MG tablet Take 1 tablet (10 mg total) by mouth 2 (two) times daily. 01/19/15   Marcial Pacas, MD  nitroGLYCERIN (NITRODUR - DOSED IN MG/24 HR) 0.2 mg/hr patch PLACE 1 PATCH ONTO THE SKIN DAILY 04/02/14   Peter M Martinique, MD  pantoprazole (PROTONIX) 20 MG tablet TAKE 1 TABLET BY MOUTH EVERY DAY 07/18/14   Inda Castle, MD  pravastatin (PRAVACHOL) 40 MG tablet TAKE 1 TABLET BY MOUTH EVERY EVENING 07/18/14   Peter M Martinique, MD  PROVENTIL HFA 108 (90 BASE) MCG/ACT inhaler Inhale 2 puffs into the lungs every 6 (six) hours as needed for wheezing or shortness of breath. As directed 05/24/13   Historical Provider, MD  tiotropium (SPIRIVA) 18 MCG inhalation capsule Place 1 capsule (18 mcg total) into inhaler and inhale daily. 08/11/14   Juanito Doom, MD   BP 113/55 mmHg  Pulse 108  Temp(Src) 100.6 F (38.1 C) (Oral)  Resp 22  SpO2 94% Physical Exam  Constitutional: He is oriented to person, place, and time. He appears well-developed and well-nourished.  HENT:  Head: Normocephalic and atraumatic.  Cardiovascular: Normal rate and regular rhythm.   Pulmonary/Chest: Effort normal. He has no wheezes. He has no rales.  Abdominal: Soft. Bowel sounds are normal. There is no tenderness.  Musculoskeletal: Normal range of motion. He exhibits no edema.  Neurological: He is alert and oriented to person, place, and time. He exhibits abnormal muscle tone. Coordination normal.  Skin: Skin is warm and dry.  Nursing note and vitals reviewed.   ED Course  Procedures (including critical care time) Labs Review Labs Reviewed  BRAIN NATRIURETIC PEPTIDE - Abnormal; Notable for the following:    B Natriuretic Peptide 131.2 (*)    All other components within normal limits  CBC WITH DIFFERENTIAL/PLATELET -  Abnormal; Notable for the following:    WBC 11.7 (*)    Hemoglobin 12.7 (*)    Neutrophils Relative % 86 (*)    Neutro Abs 10.0 (*)    Lymphocytes Relative 8 (*)    All other components within normal limits  BASIC METABOLIC PANEL - Abnormal; Notable for the following:    Glucose, Bld 149 (*)    BUN 30 (*)    Creatinine, Ser 1.80 (*)    GFR calc non Af Amer 34 (*)    GFR calc Af Amer 40 (*)    All other components within normal limits  URINALYSIS, ROUTINE W REFLEX MICROSCOPIC (NOT AT  [facility]) - Abnormal; Notable for the following:    Color, Urine YELLOW (*)    All other components within normal limits  CULTURE, BLOOD (ROUTINE X 2)  CULTURE, BLOOD (ROUTINE X 2)  TROPONIN I    Imaging Review Dg Chest 2 View  02/07/2015   CLINICAL DATA:  Short of breath  EXAM: CHEST  2 VIEW  COMPARISON:  01/13/2015  FINDINGS: There are ill-defined patchy opacities at the right lung base. Left lung is grossly clear. Dual lead right subclavian pacemaker device and AICD device are stable. Hyperaeration. No pneumothorax or pleural effusion.  IMPRESSION: Patchy airspace disease at the right lung base. Followup PA and lateral chest X-ray is recommended in 3-4 weeks following trial of antibiotic therapy to ensure resolution and exclude underlying malignancy.   Electronically Signed   By: Marybelle Killings M.D.   On: 02/07/2015 14:25    ED ECG REPORT   Date: 02/07/2015  Rate: 72  Rhythm: normal sinus rhythm  QRS Axis: right  Intervals: normal  ST/T Wave abnormalities: normal  Conduction Disutrbances:none  Narrative Interpretation:   Old EKG Reviewed: none available  I have personally reviewed the EKG tracing and agree with the computerized printout as noted.   MDM   Final diagnoses:  SOB (shortness of breath)  HCAP (healthcare-associated pneumonia)    Pt is to be admitted for hcap. Pt to be admitted on tele. Started on vancomycin and zosyn. Pt will oxygen will drop to the high 80's. No distress at this  time    Glendell Docker, NP 02/07/15 [place], MD 02/07/15 [phone]

## 2015-02-07 NOTE — ED Notes (Signed)
Pt here via EMS from home with c/o SOB this morning that has since resolved. Pt was admitted with pneumonia on 6/14 and has been compliant with abx. Pt has persistent cough since admission. Pt was also c/o nausea that has also resolved. Pt has fever 100.6 at this time.

## 2015-02-07 NOTE — ED Notes (Signed)
Admitting MD at bedside.

## 2015-02-07 NOTE — H&P (Signed)
[facility] History and Physical  Antonio Ray XBD:[id number] DOB: 1933/12/08 DOA: 02/07/2015  Referring physician: Essie Christine,  PCP: [name], MD   Chief Complaint: SOB  HPI: Antonio Ray is a 81 y.o. male with PMH significant for Chronic Systolic HF EF 35 % ECHO 8341, COPD, CKD Cr last admission was at 1.7, last discharge from [facility] 01-15-2015 treated at that time for COPD exacerbation, PNA, Acute on chronic renal failure, who presents complaining of SOB. Patient started to have worsening cough over last week, productive , green sputum. Also worsening SOB, worse since yesterday. He has been feeling dizziness, chills. He finished antibiotics from last admission. He has history of esophageal stricture, he plan to follow up with Dr [name] for dilation. Patient denies food getting stock. Does relates some reflux, and back up of fluids.  Patient denies chest pain, abdominal pain, or diarrhea.  Patient has notice cough while eating.  Evaluation in the ED; Chest x ray with Air space disease right lung base. WBC at 11, Cr at 1.8, BNP 131, troponin negative, mild fevers/    Review of Systems:  Negative, except as per HPI/   Past Medical History  Diagnosis Date  . GERD (gastroesophageal reflux disease)   . Coronary artery disease     History of remote anterior MI with PCI to LAD in 2006; most recent cath 2007, no intervention required  . Chronic systolic dysfunction of left ventricle     EF 30-35%, CLASS II - III SYMPTOMS; intolerant to Coreg  . Hyperlipidemia   . PVC's (premature ventricular contractions)   . DOE (dyspnea on exertion)   . Intolerance, drug     Intolerant to beta blockers  . BPH (benign prostatic hyperplasia)   . CHF (congestive heart failure)   . COPD (chronic obstructive pulmonary disease)   . AICD (automatic cardioverter/defibrillator) present 03/30/2011    Analyze ST study patient  . Heart murmur     hx  . Myocardial infarction 1994    ANTERIOR    . Pneumonia "several times"  . Type II diabetes mellitus   . CRF (chronic renal failure)   . CKD (chronic kidney disease) stage 3, GFR 30-59 ml/min 06/08/2012  . Skin cancer "several"    "forearms; head"  . Memory loss    Past Surgical History  Procedure Laterality Date  . Foot fracture surgery Right 1980's  . Cholecystectomy  2/11  . Cataract extraction w/ intraocular lens  implant, bilateral  08/2014-09/2014  . Cardiac catheterization  01/11/2006    DEMONSTRATES AKINESIA OF THE DISTAL ANTERIOR WALL, DISTAL INFERIOR WALL AND AKINESIA OF THE APEX. THE BASAL SEGMENTS CONTRACT WELL AND OVERALL EF 35%  . Coronary angioplasty  1994    TO THE LAD  . Skin cancer excision  "several"    "forearms, head"   Social History:  reports that he quit smoking about 22 years ago. His smoking use included Cigarettes. He has a 35 pack-year smoking history. He has never used smokeless tobacco. He reports that he drinks alcohol. He reports that he does not use illicit drugs.  Allergies  Allergen Reactions  . Codeine Other (See Comments)    Gets very angry, disoriented  . Dilaudid [Hydromorphone Hcl] Other (See Comments)    VERY AGITATED, HOSTILE  . Flomax [Tamsulosin Hcl] Shortness Of Breath  . Morphine And Related Other (See Comments)    VERY AGITATED, HOSTILE  . Sulfa Antibiotics Shortness Of Breath  . Carvedilol Other (See Comments)  DISORIENTATION  . Levofloxacin Other (See Comments)    unknown    Family History  Problem Relation Age of Onset  . Stroke Father     Mother history unknown - never met her    Prior to Admission medications   Medication Sig Start Date End Date Taking? Authorizing Provider  aspirin 81 MG EC tablet Take 81 mg by mouth daily.      Historical Provider, MD  budesonide-formoterol (SYMBICORT) 160-4.5 MCG/ACT inhaler Inhale 2 puffs into the lungs 2 (two) times daily. 09/19/14   Juanito Doom, MD  Cholecalciferol (VITAMIN D PO) Take 2,000 Units by mouth daily.      Historical Provider, MD  donepezil (ARICEPT) 10 MG tablet Take 10 mg by mouth every morning.    Historical Provider, MD  fenofibrate (TRICOR) 145 MG tablet TAKE 1 TABLET BY MOUTH EVERY DAY 01/22/15   Peter M Martinique, MD  furosemide (LASIX) 20 MG tablet TAKE 1 TABLET BY MOUTH EVERY DAY 09/19/14   Peter M Martinique, MD  guaiFENesin (MUCINEX) 600 MG 12 hr tablet Take 1 tablet (600 mg total) by mouth 2 (two) times daily as needed for to loosen phlegm (for chest congestion). 01/15/15   Debbe Odea, MD  insulin glargine (LANTUS) 100 UNIT/ML injection Inject 24 Units into the skin every morning.     Historical Provider, MD  memantine (NAMENDA) 10 MG tablet Take 1 tablet (10 mg total) by mouth 2 (two) times daily. 01/19/15   Marcial Pacas, MD  nitroGLYCERIN (NITRODUR - DOSED IN MG/24 HR) 0.2 mg/hr patch PLACE 1 PATCH ONTO THE SKIN DAILY 04/02/14   Peter M Martinique, MD  pantoprazole (PROTONIX) 20 MG tablet TAKE 1 TABLET BY MOUTH EVERY DAY 07/18/14   Inda Castle, MD  pravastatin (PRAVACHOL) 40 MG tablet TAKE 1 TABLET BY MOUTH EVERY EVENING 07/18/14   Peter M Martinique, MD  PROVENTIL HFA 108 (90 BASE) MCG/ACT inhaler Inhale 2 puffs into the lungs every 6 (six) hours as needed for wheezing or shortness of breath. As directed 05/24/13   Historical Provider, MD  tiotropium (SPIRIVA) 18 MCG inhalation capsule Place 1 capsule (18 mcg total) into inhaler and inhale daily. 08/11/14   Juanito Doom, MD   Physical Exam: Filed Vitals:   02/07/15 1235 02/07/15 1259 02/07/15 1401 02/07/15 1402  BP: 113/55  132/60   Pulse: 108  97 96  Temp:  100.5 F (38.1 C)    TempSrc:  Rectal    Resp: 22   34  SpO2: 94%  96% 92%    Wt Readings from Last 3 Encounters:  01/19/15 84.369 kg (186 lb)  01/15/15 84.823 kg (187 lb)  09/25/14 87.272 kg (192 lb 6.4 oz)    General:  Appears calm and comfortable Eyes: PERRL, normal lids, irises & conjunctiva ENT: grossly normal hearing, lips & tongue Neck: no LAD, masses or  thyromegaly Cardiovascular: RRR, no m/r/g. No LE edema. Respiratory: Normal respiratory effort. Crackles more pronounced on the right/  Abdomen: soft, nt, , obese, distended at baseline.  Skin: no rash or induration seen on limited exam Musculoskeletal: grossly normal tone BUE/BLE Psychiatric: grossly normal mood and affect, speech fluent and appropriate Neurologic: grossly non-focal.          Labs on Admission:  Basic Metabolic Panel:  Recent Labs Lab 02/07/15 1300  NA 140  K 4.0  CL 104  CO2 28  GLUCOSE 149*  BUN 30*  CREATININE 1.80*  CALCIUM 9.0   Liver Function Tests:  No results for input(s): AST, ALT, ALKPHOS, BILITOT, PROT, ALBUMIN in the last 168 hours. No results for input(s): LIPASE, AMYLASE in the last 168 hours. No results for input(s): AMMONIA in the last 168 hours. CBC:  Recent Labs Lab 02/07/15 1300  WBC 11.7*  NEUTROABS 10.0*  HGB 12.7*  HCT 40.5  MCV 88.6  PLT 211   Cardiac Enzymes:  Recent Labs Lab 02/07/15 1300  TROPONINI <0.03    BNP (last 3 results)  Recent Labs  01/13/15 2059 02/07/15 1300  BNP 176.5* 131.2*    ProBNP (last 3 results) No results for input(s): PROBNP in the last 8760 hours.  CBG: No results for input(s): GLUCAP in the last 168 hours.  Radiological Exams on Admission: Dg Chest 2 View  02/07/2015   CLINICAL DATA:  Short of breath  EXAM: CHEST  2 VIEW  COMPARISON:  01/13/2015  FINDINGS: There are ill-defined patchy opacities at the right lung base. Left lung is grossly clear. Dual lead right subclavian pacemaker device and AICD device are stable. Hyperaeration. No pneumothorax or pleural effusion.  IMPRESSION: Patchy airspace disease at the right lung base. Followup PA and lateral chest X-ray is recommended in 3-4 weeks following trial of antibiotic therapy to ensure resolution and exclude underlying malignancy.   Electronically Signed   By: Marybelle Killings M.D.   On: 02/07/2015 14:25    EKG: Independently reviewed.  Tachycardic, LAFB  Assessment/Plan Active Problems:   Chronic systolic congestive heart failure   Diabetes mellitus type II, non insulin dependent   S/P ICD (internal cardiac defibrillator) procedure   COPD (chronic obstructive pulmonary disease)   CKD (chronic kidney disease) stage 3, GFR 30-59 ml/min   PNA (pneumonia)  1-PNA, health care vs aspiration.  Continue with IV vancomycin and Zosyn.  Speech evaluation. Concern for aspiration. Continue with Protonix.  Schedule guaifenesin.  Sputum culture, blood culture.   2-CKD; Last admission cr was at 1.7.  Monitor on IV lasix.  This might be his new baseline/   3-COPD; no wheezing on lung exam. Continue with home medications. Nebulizer treatments.   4-Systolic HF; Appears compensated. Monitor closely. Continue with home oral lasix.   5-Diabetes; continue with lantus. SSI.    Code Status: Full Code, per patient wishes.  DVT Prophylaxis: lovenox.  Family Communication: Care discussed with Son who was at bedside.  Disposition Plan: expect 3 to 4 days inpatient. Needs PT evaluation.   Time spent: 75 minutes.   Niel Hummer A [facility] Pager [phone]

## 2015-02-08 DIAGNOSIS — I5022 Chronic systolic (congestive) heart failure: Secondary | ICD-10-CM

## 2015-02-08 DIAGNOSIS — R1314 Dysphagia, pharyngoesophageal phase: Secondary | ICD-10-CM

## 2015-02-08 DIAGNOSIS — E119 Type 2 diabetes mellitus without complications: Secondary | ICD-10-CM

## 2015-02-08 DIAGNOSIS — K222 Esophageal obstruction: Secondary | ICD-10-CM

## 2015-02-08 LAB — BASIC METABOLIC PANEL
Anion gap: 9 (ref 5–15)
BUN: 30 mg/dL — ABNORMAL HIGH (ref 6–20)
CHLORIDE: 104 mmol/L (ref 101–111)
CO2: 25 mmol/L (ref 22–32)
Calcium: 8.7 mg/dL — ABNORMAL LOW (ref 8.9–10.3)
Creatinine, Ser: 1.6 mg/dL — ABNORMAL HIGH (ref 0.61–1.24)
GFR calc Af Amer: 46 mL/min — ABNORMAL LOW (ref >60)
GFR calc non Af Amer: 39 mL/min — ABNORMAL LOW (ref >60)
Glucose, Bld: 116 mg/dL — ABNORMAL HIGH (ref 65–99)
POTASSIUM: 3.9 mmol/L (ref 3.5–5.1)
Sodium: 138 mmol/L (ref 135–145)

## 2015-02-08 LAB — CBC
HEMATOCRIT: 37.4 % — AB (ref 39.0–52.0)
HEMOGLOBIN: 11.8 g/dL — AB (ref 13.0–17.0)
MCH: 28.4 pg (ref 26.0–34.0)
MCHC: 31.6 g/dL (ref 30.0–36.0)
MCV: 90.1 fL (ref 78.0–100.0)
Platelets: 185 10*3/uL (ref 150–400)
RBC: 4.15 MIL/uL — AB (ref 4.22–5.81)
RDW: 14 % (ref 11.5–15.5)
WBC: 9.2 10*3/uL (ref 4.0–10.5)

## 2015-02-08 LAB — GLUCOSE, CAPILLARY
Glucose-Capillary: 106 mg/dL — ABNORMAL HIGH (ref 65–99)
Glucose-Capillary: 197 mg/dL — ABNORMAL HIGH (ref 65–99)
Glucose-Capillary: 238 mg/dL — ABNORMAL HIGH (ref 65–99)
Glucose-Capillary: 244 mg/dL — ABNORMAL HIGH (ref 65–99)

## 2015-02-08 LAB — EXPECTORATED SPUTUM ASSESSMENT W GRAM STAIN, RFLX TO RESP C

## 2015-02-08 LAB — EXPECTORATED SPUTUM ASSESSMENT W REFEX TO RESP CULTURE

## 2015-02-08 MED ORDER — ALBUTEROL SULFATE (2.5 MG/3ML) 0.083% IN NEBU
2.5000 mg | INHALATION_SOLUTION | Freq: Two times a day (BID) | RESPIRATORY_TRACT | Status: DC
  Administered 2015-02-08 – 2015-02-10 (×4): 2.5 mg via RESPIRATORY_TRACT
  Filled 2015-02-08 (×5): qty 3

## 2015-02-08 MED ORDER — ENOXAPARIN SODIUM 40 MG/0.4ML ~~LOC~~ SOLN
40.0000 mg | SUBCUTANEOUS | Status: DC
  Administered 2015-02-08 – 2015-02-09 (×2): 40 mg via SUBCUTANEOUS
  Filled 2015-02-08 (×3): qty 0.4

## 2015-02-08 MED ORDER — ALBUTEROL SULFATE (2.5 MG/3ML) 0.083% IN NEBU: 2.5000 mg | INHALATION_SOLUTION | RESPIRATORY_TRACT | Status: DC | PRN

## 2015-02-08 NOTE — Evaluation (Signed)
Clinical/Bedside Swallow Evaluation Patient Details  Name: Antonio Ray MRN: [id number] Date of Birth: July 17, 1934  Today's Date: 02/08/2015 Time: SLP Start Time (ACUTE ONLY): 1140 SLP Stop Time (ACUTE ONLY): 1157 SLP Time Calculation (min) (ACUTE ONLY): 17 min  Past Medical History:  Past Medical History  Diagnosis Date  . GERD (gastroesophageal reflux disease)   . Coronary artery disease     History of remote anterior MI with PCI to LAD in 2006; most recent cath 2007, no intervention required  . Chronic systolic dysfunction of left ventricle     EF 30-35%, CLASS II - III SYMPTOMS; intolerant to Coreg  . Hyperlipidemia   . PVC's (premature ventricular contractions)   . DOE (dyspnea on exertion)   . Intolerance, drug     Intolerant to beta blockers  . BPH (benign prostatic hyperplasia)   . CHF (congestive heart failure)   . COPD (chronic obstructive pulmonary disease)   . AICD (automatic cardioverter/defibrillator) present 03/30/2011    Analyze ST study patient  . Heart murmur     hx  . Myocardial infarction 1994    ANTERIOR  . Pneumonia "several times"  . Type II diabetes mellitus   . CRF (chronic renal failure)   . CKD (chronic kidney disease) stage 3, GFR 30-59 ml/min 06/08/2012  . Skin cancer "several"    "forearms; head"  . Memory loss   . Anginal pain   . Dysrhythmia    Past Surgical History:  Past Surgical History  Procedure Laterality Date  . Foot fracture surgery Right 1980's  . Cholecystectomy  2/11  . Cataract extraction w/ intraocular lens  implant, bilateral  08/2014-09/2014  . Cardiac catheterization  01/11/2006    DEMONSTRATES AKINESIA OF THE DISTAL ANTERIOR WALL, DISTAL INFERIOR WALL AND AKINESIA OF THE APEX. THE BASAL SEGMENTS CONTRACT WELL AND OVERALL EF 35%  . Coronary angioplasty  1994    TO THE LAD  . Skin cancer excision  "several"    "forearms, head"  . Insert / replace / remove pacemaker     HPI:  Pt is a 81 year old male admitted with CHF,  pna. To be evaluated for aspiration. CT chest shows Patchy airspace disease in the lung bases, greater on the right, and possible dysmotility, reflux, achalasia of esophagus.    Assessment / Plan / Recommendation Clinical Impression  Pt presents with signs of a primary esophageal dysphagia. Trials of puree and small bites of solids followed by liquids result in grimacing and globus followed by expectoration of PO and mucous. Esophagram report this year diagnoses probable stricture with appointment scheduled for dilatation, which pt did not attend. He reports that he assumed it would improve independently. Given severity of deficits, risk of postprandial aspiration high. Recommend f/u with GI as an inpatient to address pts dysphagia. Discussed esophageal precautions with pt. SLP will f/u to reinforce prior to d/c.     Aspiration Risk  Moderate    Diet Recommendation Age appropriate regular solids;Thin   Medication Administration: Whole meds with liquid Compensations: Follow solids with liquid;Multiple dry swallows after each bite/sip;Slow rate;Small sips/bites    Other  Recommendations Recommended Consults: Consider GI evaluation Oral Care Recommendations: Oral care BID   Follow Up Recommendations       Frequency and Duration min 1 x/week  2 weeks   Pertinent Vitals/Pain NA    SLP Swallow Goals     Swallow Study Prior Functional Status       General Other Pertinent Information: Pt  is a 81 year old male admitted with CHF, pna. To be evaluated for aspiration. CT chest shows Patchy airspace disease in the lung bases, greater on the right, and possible dysmotility, reflux, achalasia of esophagus.  Type of Study: Bedside swallow evaluation Diet Prior to this Study: Regular;Thin liquids Temperature Spikes Noted: No Respiratory Status: Room air History of Recent Intubation: No Behavior/Cognition: Alert;Cooperative;Pleasant mood Oral Cavity - Dentition: Adequate natural  dentition/normal for age Self-Feeding Abilities: Able to feed self Patient Positioning: Upright in chair/Tumbleform Baseline Vocal Quality: Normal Volitional Cough: Strong Volitional Swallow: Able to elicit    Oral/Motor/Sensory Function Overall Oral Motor/Sensory Function: Appears within functional limits for tasks assessed   Ice Chips     Thin Liquid Thin Liquid: Impaired Other Comments: globus    Nectar Thick Nectar Thick Liquid: Not tested   Honey Thick Honey Thick Liquid: Not tested   Puree Puree: Impaired Presentation: Self Fed Pharyngeal Phase Impairments: Cough - Delayed (expectoration)   Solid   GO    Solid: Impaired Presentation: Self Fed Pharyngeal Phase Impairments: Cough - Delayed (grimace, expectoration)      Herbie Baltimore, MA CCC-SLP [phone]  Canary Fister, Katherene Ponto 02/08/2015,1:14 PM

## 2015-02-08 NOTE — Progress Notes (Signed)
[facility] PROGRESS NOTE  Antonio Ray STM:[id number] DOB: 02-May-1934 DOA: 02/07/2015 PCP: [name], MD  Assessment/Plan: 1. Suspected aspiration pneumonia -Patient with history of distal esophageal stricture based on barium swallow formed on 11/05/2013 which had been managed conservatively.  -He presented with complaints of "choking up" on his food as initial chest x-ray revealed patchy airspace disease at the right lung base which could be consistent with aspiration pneumonia. -Will discontinue vancomycin, on Zosyn  2.  Dysphagia/esophageal stricture -Patient reporting dysphasia with liquids and solids. He had a barium swallow performed on 11/05/2013 that showed distal esophageal narrowing as well. He has been seeing Dr. [name] of GI in the outpatient setting. -Discussed case with Dr. Henrene Pastor who recommended a modified barium swallow with SLP as there may be oropharyngeal dysphagia leading to aspiration. Distal esophageal stricture may not be the cause of his dysphagia. -GI will see in am.   3.  Chronic systolic congestive heart failure -Last transthoracic echocardiogram was performed on 01/14/2015 that showed an ejection fraction of 35-40%. Currently compensated -Continue Lasix 20 mg by mouth daily  4.  Insulin dependent diabetes mellitus -Blood sugars fluctuated between 106 and 197 -Continue home regimen with Lantus 24 units subcutaneous daily and sliding scale coverage  5.  Coronary artery disease -Stable, patient denies chest pain. Suspect shortness of breath is probably related to aspiration -Continue aspirin  Code Status: Full code Family Communication: I spoke with his son over telephone conversation Disposition Plan: GI consulted, anticipate discharge home in medically stable   Consultants:  GI  Antibiotics:  IV Zosyn  HPI/Subjective: Patient is a pleasant 81 year old gentleman with a past medical history of chronic systolic congestive heart failure  having an ejection fraction of 35%, chronic kidney disease, established history of coronary artery disease, presented to the emergency department on 02/07/2015 with complaints of increasing cough associated with shortness of breath. Patient reporting intermittent spells of "choking up" on his food. He reports having difficulty swallowing over the past year. He had a barium swallow that was performed on 11/05/2013 that revealed distal esophageal narrowing with distal esophageal stricture. At the time he was managed conservatively, however, given the progression of symptoms he had been set up to undergo EGD in August. Workup included a two-view chest x-ray that revealed patchy airspace disease at the right lung base concerning for aspiration pneumonia. He was started on IV vancomycin and Zosyn.   Objective: Filed Vitals:   02/08/15 0455  BP: 102/40  Pulse: 63  Temp: 97.8 F (36.6 C)  Resp: 18    Intake/Output Summary (Last 24 hours) at 02/08/15 1325 Last data filed at 02/08/15 1305  Gross per 24 hour  Intake   1040 ml  Output    975 ml  Net     65 ml   Filed Weights   02/07/15 1658 02/08/15 0455  Weight: 84.233 kg (185 lb 11.2 oz) 84.324 kg (185 lb 14.4 oz)    Exam:   General:  Patient is awake and alert, nontoxic appearing, ambulating around his room  Cardiovascular: Regular rate and rhythm normal S1-S2  Respiratory: Right sided crackles and rhonchi, normal respiratory effort, breathing currently on room air  Abdomen: Soft nontender nondistended  Musculoskeletal: No extremity edema  Data Reviewed: Basic Metabolic Panel:  Recent Labs Lab 02/07/15 1300 02/08/15 0315  NA 140 138  K 4.0 3.9  CL 104 104  CO2 28 25  GLUCOSE 149* 116*  BUN 30* 30*  CREATININE 1.80* 1.60*  CALCIUM 9.0  8.7*   Liver Function Tests: No results for input(s): AST, ALT, ALKPHOS, BILITOT, PROT, ALBUMIN in the last 168 hours. No results for input(s): LIPASE, AMYLASE in the last 168 hours. No  results for input(s): AMMONIA in the last 168 hours. CBC:  Recent Labs Lab 02/07/15 1300 02/08/15 0315  WBC 11.7* 9.2  NEUTROABS 10.0*  --   HGB 12.7* 11.8*  HCT 40.5 37.4*  MCV 88.6 90.1  PLT 211 185   Cardiac Enzymes:  Recent Labs Lab 02/07/15 1300  TROPONINI <0.03   BNP (last 3 results)  Recent Labs  01/13/15 2059 02/07/15 1300  BNP 176.5* 131.2*    ProBNP (last 3 results) No results for input(s): PROBNP in the last 8760 hours.  CBG:  Recent Labs Lab 02/07/15 1635 02/07/15 2117 02/08/15 0631 02/08/15 1130  GLUCAP 183* 246* 106* 197*    Recent Results (from the past 240 hour(s))  Blood culture (routine x 2)     Status: None (Preliminary result)   Collection Time: 02/07/15  1:00 PM  Result Value Ref Range Status   Specimen Description BLOOD RIGHT ANTECUBITAL  Final   Special Requests BOTTLES DRAWN AEROBIC AND ANAEROBIC 10ML  Final   Culture PENDING  Incomplete   Report Status PENDING  Incomplete     Studies: Dg Chest 2 View  02/07/2015   CLINICAL DATA:  Short of breath  EXAM: CHEST  2 VIEW  COMPARISON:  01/13/2015  FINDINGS: There are ill-defined patchy opacities at the right lung base. Left lung is grossly clear. Dual lead right subclavian pacemaker device and AICD device are stable. Hyperaeration. No pneumothorax or pleural effusion.  IMPRESSION: Patchy airspace disease at the right lung base. Followup PA and lateral chest X-ray is recommended in 3-4 weeks following trial of antibiotic therapy to ensure resolution and exclude underlying malignancy.   Electronically Signed   By: Marybelle Killings M.D.   On: 02/07/2015 14:25    Scheduled Meds: . albuterol  2.5 mg Nebulization Q6H  . aspirin EC  81 mg Oral Daily  . budesonide-formoterol  2 puff Inhalation BID  . cholecalciferol  2,000 Units Oral Daily  . donepezil  10 mg Oral q morning - 10a  . enoxaparin (LOVENOX) injection  40 mg Subcutaneous Q24H  . fenofibrate  160 mg Oral Daily  . furosemide  20 mg  Oral Daily  . guaiFENesin  600 mg Oral BID  . insulin aspart  0-9 Units Subcutaneous TID WC  . insulin glargine  24 Units Subcutaneous q morning - 10a  . memantine  10 mg Oral BID  . pantoprazole  20 mg Oral Daily  . piperacillin-tazobactam (ZOSYN)  IV  3.375 g Intravenous 3 times per day  . pravastatin  40 mg Oral QPM  . sodium chloride  3 mL Intravenous Q12H  . sodium chloride  3 mL Intravenous Q12H  . tiotropium  18 mcg Inhalation Daily  . vancomycin  1,250 mg Intravenous Q24H   Continuous Infusions:   Active Problems:   Chronic systolic congestive heart failure   Diabetes mellitus type II, non insulin dependent   S/P ICD (internal cardiac defibrillator) procedure   COPD (chronic obstructive pulmonary disease)   CKD (chronic kidney disease) stage 3, GFR 30-59 ml/min   PNA (pneumonia)    Time spent: 35 min    Kelvin Cellar  [facility] Pager [phone]. If 7PM-7AM, please contact night-coverage at [email or web address], password [facility] 02/08/2015, 1:25 PM  LOS: 1 day

## 2015-02-09 DIAGNOSIS — J690 Pneumonitis due to inhalation of food and vomit: Principal | ICD-10-CM

## 2015-02-09 DIAGNOSIS — K224 Dyskinesia of esophagus: Secondary | ICD-10-CM

## 2015-02-09 DIAGNOSIS — N183 Chronic kidney disease, stage 3 (moderate): Secondary | ICD-10-CM

## 2015-02-09 LAB — GLUCOSE, CAPILLARY
GLUCOSE-CAPILLARY: 212 mg/dL — AB (ref 65–99)
Glucose-Capillary: 136 mg/dL — ABNORMAL HIGH (ref 65–99)
Glucose-Capillary: 123 mg/dL — ABNORMAL HIGH (ref 65–99)
Glucose-Capillary: 158 mg/dL — ABNORMAL HIGH (ref 65–99)
Glucose-Capillary: 178 mg/dL — ABNORMAL HIGH (ref 65–99)

## 2015-02-09 LAB — BASIC METABOLIC PANEL
ANION GAP: 7 (ref 5–15)
BUN: 26 mg/dL — AB (ref 6–20)
CALCIUM: 9.2 mg/dL (ref 8.9–10.3)
CHLORIDE: 104 mmol/L (ref 101–111)
CO2: 30 mmol/L (ref 22–32)
Creatinine, Ser: 1.83 mg/dL — ABNORMAL HIGH (ref 0.61–1.24)
GFR calc Af Amer: 39 mL/min — ABNORMAL LOW (ref >60)
GFR calc non Af Amer: 33 mL/min — ABNORMAL LOW (ref >60)
GLUCOSE: 202 mg/dL — AB (ref 65–99)
Potassium: 4.2 mmol/L (ref 3.5–5.1)
Sodium: 141 mmol/L (ref 135–145)

## 2015-02-09 LAB — CBC
HCT: 39.6 % (ref 39.0–52.0)
Hemoglobin: 12.6 g/dL — ABNORMAL LOW (ref 13.0–17.0)
MCH: 28.3 pg (ref 26.0–34.0)
MCHC: 31.8 g/dL (ref 30.0–36.0)
MCV: 88.8 fL (ref 78.0–100.0)
PLATELETS: 208 10*3/uL (ref 150–400)
RBC: 4.46 MIL/uL (ref 4.22–5.81)
RDW: 13.8 % (ref 11.5–15.5)
WBC: 7 10*3/uL (ref 4.0–10.5)

## 2015-02-09 MED ORDER — INSULIN ASPART 100 UNIT/ML ~~LOC~~ SOLN
0.0000 [IU] | Freq: Every day | SUBCUTANEOUS | Status: DC
Start: 2015-02-09 — End: 2015-02-10

## 2015-02-09 MED ORDER — INSULIN ASPART 100 UNIT/ML ~~LOC~~ SOLN
0.0000 [IU] | Freq: Three times a day (TID) | SUBCUTANEOUS | Status: DC
  Administered 2015-02-09: 2 [IU] via SUBCUTANEOUS
  Administered 2015-02-09: 3 [IU] via SUBCUTANEOUS
  Administered 2015-02-09: 5 [IU] via SUBCUTANEOUS
  Administered 2015-02-10: 2 [IU] via SUBCUTANEOUS

## 2015-02-09 NOTE — Consult Note (Signed)
[facility] Consult: 10:14 AM 02/09/2015  LOS: 2 days    Referring Provider: Dr Coralyn Pear  Primary Care Physician:  [name], MD Primary Gastroenterologist:  Dr. [name]     Reason for Consultation:  dysphagia   HPI: Antonio Ray is a 81 y.o. male.  Hx CAD and cardiomyopathy (EF 35to 40%), s/p 2012 ICD, severe COPD, stage 3 CKD, dementia, IDDM, chronic left ventricular apical thrombus. 09/2009 gallstone pancreatitis and lap chole (no ERCP).  On daily 81 ASA.    Dr [name] saw pt re dysphagia in 10/2013. Meal associated regurgitation of gastric contents Ba swallow 11/05/13: distal esophageal narrowing, can not rule out stricture, rule out achalasia though esophagus not dilated, + tertiary contractions.  Looks like pt decided against pursuing EGD. Pt not seen since 10/2013.   6/14 - 01/15/15 admission with COPD flare/? PNA.  Discharged to complete Zithromax 6/17 and on 40 mg prednisone.   Admitted yesterday with PNA, suspect aspiration caused. C/o SOB, purulent sputum, dizziness, chills. Right sided pna per xray.  Bedside swallow eval 7/10: " signs of a primary esophageal dysphagia. Trials of puree and small bites of solids followed by liquids result in grimacing and globus followed by expectoration of PO and mucous...  Given severity of deficits, risk of postprandial aspiration high. Recommend f/u with GI as an inpatient to address pts dysphagia"   Approved for solids, thin liquids    No records of colonoscopy or EGD in Epic records dating back to 2011 and radiology records to 20003  Past Medical History  Diagnosis Date  . GERD (gastroesophageal reflux disease)   . Coronary artery disease     History of remote anterior MI with PCI to LAD in 2006; most recent cath 2007, no intervention required  . Chronic  systolic dysfunction of left ventricle     EF 30-35%, CLASS II - III SYMPTOMS; intolerant to Coreg  . Hyperlipidemia   . PVC's (premature ventricular contractions)   . DOE (dyspnea on exertion)   . Intolerance, drug     Intolerant to beta blockers  . BPH (benign prostatic hyperplasia)   . CHF (congestive heart failure)   . COPD (chronic obstructive pulmonary disease)   . AICD (automatic cardioverter/defibrillator) present 03/30/2011    Analyze ST study patient  . Heart murmur     hx  . Myocardial infarction 1994    ANTERIOR  . Pneumonia "several times"  . Type II diabetes mellitus   . CRF (chronic renal failure)   . CKD (chronic kidney disease) stage 3, GFR 30-59 ml/min 06/08/2012  . Skin cancer "several"    "forearms; head"  . Memory loss   . Anginal pain   . Dysrhythmia     Past Surgical History  Procedure Laterality Date  . Foot fracture surgery Right 1980's  . Cholecystectomy  2/11  . Cataract extraction w/ intraocular lens  implant, bilateral  08/2014-09/2014  . Cardiac catheterization  01/11/2006    DEMONSTRATES AKINESIA OF THE DISTAL ANTERIOR WALL, DISTAL INFERIOR WALL AND AKINESIA OF THE APEX. THE BASAL  SEGMENTS CONTRACT WELL AND OVERALL EF 35%  . Coronary angioplasty  1994    TO THE LAD  . Skin cancer excision  "several"    "forearms, head"  . Insert / replace / remove pacemaker      Prior to Admission medications   Medication Sig Start Date End Date Taking? Authorizing Provider  aspirin 81 MG EC tablet Take 81 mg by mouth daily.     Yes Historical Provider, MD  budesonide-formoterol (SYMBICORT) 160-4.5 MCG/ACT inhaler Inhale 2 puffs into the lungs 2 (two) times daily. 09/19/14  Yes Juanito Doom, MD  Cholecalciferol (VITAMIN D PO) Take 2,000 Units by mouth daily.    Yes Historical Provider, MD  donepezil (ARICEPT) 10 MG tablet Take 10 mg by mouth every morning.   Yes Historical Provider, MD  fenofibrate (TRICOR) 145 MG tablet TAKE 1 TABLET BY MOUTH EVERY DAY  01/22/15  Yes Peter M Martinique, MD  furosemide (LASIX) 20 MG tablet TAKE 1 TABLET BY MOUTH EVERY DAY 09/19/14  Yes Peter M Martinique, MD  guaiFENesin (MUCINEX) 600 MG 12 hr tablet Take 1 tablet (600 mg total) by mouth 2 (two) times daily as needed for to loosen phlegm (for chest congestion). 01/15/15  Yes Debbe Odea, MD  insulin glargine (LANTUS) 100 UNIT/ML injection Inject 24 Units into the skin every morning.    Yes Historical Provider, MD  losartan (COZAAR) 25 MG tablet Take 25 mg by mouth daily. 01/23/15  Yes Historical Provider, MD  memantine (NAMENDA) 10 MG tablet Take 1 tablet (10 mg total) by mouth 2 (two) times daily. 01/19/15  Yes Marcial Pacas, MD  Multiple Vitamins-Minerals (PRESERVISION AREDS 2) CAPS Take 1 capsule by mouth 2 (two) times daily.   Yes Historical Provider, MD  nitroGLYCERIN (NITRODUR - DOSED IN MG/24 HR) 0.2 mg/hr patch PLACE 1 PATCH ONTO THE SKIN DAILY 04/02/14  Yes Peter M Martinique, MD  pantoprazole (PROTONIX) 20 MG tablet TAKE 1 TABLET BY MOUTH EVERY DAY 07/18/14  Yes Inda Castle, MD  pravastatin (PRAVACHOL) 40 MG tablet TAKE 1 TABLET BY MOUTH EVERY EVENING 07/18/14  Yes Peter M Martinique, MD  PROVENTIL HFA 108 (90 BASE) MCG/ACT inhaler Inhale 2 puffs into the lungs every 6 (six) hours as needed for wheezing or shortness of breath. As directed 05/24/13  Yes Historical Provider, MD  tiotropium (SPIRIVA) 18 MCG inhalation capsule Place 1 capsule (18 mcg total) into inhaler and inhale daily. 08/11/14  Yes Juanito Doom, MD    Scheduled Meds: . albuterol  2.5 mg Nebulization BID  . aspirin EC  81 mg Oral Daily  . budesonide-formoterol  2 puff Inhalation BID  . cholecalciferol  2,000 Units Oral Daily  . donepezil  10 mg Oral q morning - 10a  . enoxaparin (LOVENOX) injection  40 mg Subcutaneous Q24H  . fenofibrate  160 mg Oral Daily  . furosemide  20 mg Oral Daily  . guaiFENesin  600 mg Oral BID  . insulin aspart  0-15 Units Subcutaneous TID WC  . insulin aspart  0-5 Units  Subcutaneous QHS  . insulin glargine  24 Units Subcutaneous q morning - 10a  . memantine  10 mg Oral BID  . pantoprazole  20 mg Oral Daily  . piperacillin-tazobactam (ZOSYN)  IV  3.375 g Intravenous 3 times per day  . pravastatin  40 mg Oral QPM  . sodium chloride  3 mL Intravenous Q12H  . sodium chloride  3 mL Intravenous Q12H  . tiotropium  18  mcg Inhalation Daily   Infusions:   PRN Meds: sodium chloride, acetaminophen **OR** acetaminophen, albuterol, ondansetron **OR** ondansetron (ZOFRAN) IV, senna-docusate, sodium chloride   Allergies as of 02/07/2015 - Review Complete 02/07/2015  Allergen Reaction Noted  . Codeine Other (See Comments)   . Dilaudid [hydromorphone hcl] Other (See Comments) 09/10/2013  . Flomax [tamsulosin hcl] Shortness Of Breath 05/19/2012  . Morphine and related Other (See Comments) 05/20/2011  . Sulfa antibiotics Shortness Of Breath 05/23/2012  . Carvedilol Other (See Comments) 04/18/2011  . Levofloxacin Other (See Comments) 05/20/2011    Family History  Problem Relation Age of Onset  . Stroke Father     Mother history unknown - never met her    History   Social History  . Marital Status: Widowed    Spouse Name: N/A  . Number of Children: 2  . Years of Education: GED   Occupational History  . 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 Smoking status: Former Smoker -- 1.00 packs/day for 35 years    Types: Cigarettes    Quit date: 08/01/1992  . Smokeless tobacco: Never Used  . Alcohol Use: 0.0 oz/week    0 Standard drinks or equivalent per week     Comment: Approximately a beer twice yearly  . Drug Use: No  . Sexual Activity: Not Currently   Other Topics Concern  . Not on file   Social History Narrative   Pt lives in [place] alone.  Widowed.   Right-handed.   Rare caffeine use - maybe one cup per month.    REVIEW OF SYSTEMS: Constitutional:  Weight  stable.  Good appetitie ENT:  No nose bleeds Pulm:  Breathing, cough improved since admit of 7/9 CV:  No palpitations, no LE edema. No chest pain GU:  No hematuria, no frequency GI:  Per HPI.  Daily to qod BMs.  Never had colonoscopy or EGD Heme:  No issue with excessive bleeding or bruising.    Transfusions:  None per his recall Neuro:  No headaches, no peripheral tingling or numbness Derm:  No itching, no rash or sores.  Endocrine:  No sweats or chills.  No polyuria or dysuria Immunization:  Not queried. Travel:  None beyond local counties in last few months.    PHYSICAL EXAM: Vital signs in last 24 hours: Filed Vitals:   02/09/15 0750  BP: 120/54  Pulse: 72  Temp: 98.7 F (37.1 C)  Resp: 19   Wt Readings from Last 3 Encounters:  02/09/15 185 lb 10.5 oz (84.214 kg)  01/19/15 186 lb (84.369 kg)  01/15/15 187 lb (84.823 kg)    General: pleasant, chronically ill appearance.  comfortable Head:  No swelling or asymmetry  Eyes:  No icterus or pallor Ears:  Slightly HOH  Nose:  No discharge or congestion Mouth:  Clear, moist.  Missing lower molars.  Neck:  No mass, no JVD, no TMG Lungs:  Diminished but clear.  + mild cough but no SOB Heart: RRR.  No mrg.   Abdomen:  Soft, ventral hernia, protuberant.  Active BS.  No bruits.  No HSM.   Rectal: deferred   Musc/Skeltl: no joint swelling, deformity or redness Extremities:  No CCE  Neurologic:  Oriented x 3.  No tremor or limb weakness.   Skin:  Signs of excessive sun exposure with keratosis.  Tattoos:  none Nodes:  No cervical or  inguinal adenopathy   Psych:  Pleasant, cooperative  Intake/Output from previous day: 07/10 0701 - 07/11 0700 In: 1930 [P.O.:1780; IV Piggyback:150] Out: 3500 [Urine:3500] Intake/Output this shift: Total I/O In: 240 [P.O.:240] Out: 200 [Urine:200]  LAB RESULTS:  Recent Labs  02/07/15 1300 02/08/15 0315  WBC 11.7* 9.2  HGB 12.7* 11.8*  HCT 40.5 37.4*  PLT 211 185   BMET Lab  Results  Component Value Date   NA 138 02/08/2015   NA 140 02/07/2015   NA 138 01/15/2015   K 3.9 02/08/2015   K 4.0 02/07/2015   K 4.5 01/15/2015   CL 104 02/08/2015   CL 104 02/07/2015   CL 102 01/15/2015   CO2 25 02/08/2015   CO2 28 02/07/2015   CO2 27 01/15/2015   GLUCOSE 116* 02/08/2015   GLUCOSE 149* 02/07/2015   GLUCOSE 345* 01/15/2015   BUN 30* 02/08/2015   BUN 30* 02/07/2015   BUN 41* 01/15/2015   CREATININE 1.60* 02/08/2015   CREATININE 1.80* 02/07/2015   CREATININE 1.78* 01/15/2015   CALCIUM 8.7* 02/08/2015   CALCIUM 9.0 02/07/2015   CALCIUM 9.3 01/15/2015   LFT No results for input(s): PROT, ALBUMIN, AST, ALT, ALKPHOS, BILITOT, BILIDIR, IBILI in the last 72 hours. PT/INR Lab Results  Component Value Date   INR 1.39 01/14/2015   INR 1.17 06/18/2011   INR 1.0 03/23/2011   Hepatitis Panel No results for input(s): HEPBSAG, HCVAB, HEPAIGM, HEPBIGM in the last 72 hours. C-Diff No components found for: CDIFF Lipase     Component Value Date/Time   LIPASE 14 09/08/2009 0445    Drugs of Abuse  No results found for: LABOPIA, COCAINSCRNUR, LABBENZ, AMPHETMU, THCU, LABBARB   RADIOLOGY STUDIES: Dg Chest 2 View  02/07/2015   CLINICAL DATA:  Short of breath  EXAM: CHEST  2 VIEW  COMPARISON:  01/13/2015  FINDINGS: There are ill-defined patchy opacities at the right lung base. Left lung is grossly clear. Dual lead right subclavian pacemaker device and AICD device are stable. Hyperaeration. No pneumothorax or pleural effusion.  IMPRESSION: Patchy airspace disease at the right lung base. Followup PA and lateral chest X-ray is recommended in 3-4 weeks following trial of antibiotic therapy to ensure resolution and exclude underlying malignancy.   Electronically Signed   By: Marybelle Killings M.D.   On: 02/07/2015 14:25    ENDOSCOPIC STUDIES:   IMPRESSION:   *  Dysphagia.  Established diagnosis in 10/2013 but pt did not undergo EGD. SLP notes esophageal dysphagia on BS  swallow but did clear pt for solids, thin liquids.   *  PNA in pt who was discharged about one month ago on Prednisone/zithromax for COPD flare/? PNA.  Off prednisone now  *  Advanced COPD.   *  IDDM  *  CKD stage  3.     PLAN:     *  Barium esophagram in AM with tablet.  Case d/w Dr Hilarie Fredrickson.   Would need mac and pt would like Dr Doug Sou blessing if we do proceed with EGD.    Azucena Freed  02/09/2015, 10:14 AM Pager: [phone]

## 2015-02-09 NOTE — Progress Notes (Signed)
[facility] PROGRESS NOTE  Antonio Ray QJJ:[id number] DOB: 09/10/1933 DOA: 02/07/2015 PCP: [name], MD  Assessment/Plan: 1. Suspected aspiration pneumonia -Antonio Ray with history of distal esophageal stricture based on barium swallow formed on 11/05/2013 which had been managed conservatively.  -He presented with complaints of "choking up" on his food as initial chest x-ray revealed patchy airspace disease at the right lung base which could be consistent with aspiration pneumonia. -Will discontinue vancomycin, on Zosyn  2.  Dysphagia/esophageal stricture -Antonio Ray reporting dysphasia with liquids and solids. He had a barium swallow performed on 11/05/2013 that showed distal esophageal narrowing as well. He has been seeing Dr. [name] of GI in the outpatient setting. -GI consulted will plan for barium esophagram in AM -SLP reporting signs of primary esophageal dysphagia  3.  Chronic systolic congestive heart failure -Last transthoracic echocardiogram was performed on 01/14/2015 that showed an ejection fraction of 35-40%. Currently compensated -Continue Lasix 20 mg by mouth daily  4.  Insulin dependent diabetes mellitus -Blood sugars fluctuated between 106 and 197 -Continue home regimen with Lantus 24 units subcutaneous daily and sliding scale coverage  5.  Coronary artery disease -Stable, Antonio Ray denies chest pain. Suspect shortness of breath is probably related to aspiration -Continue aspirin  Code Status: Full code Family Communication: I spoke with his son over telephone conversation Disposition Plan: GI consulted, anticipate discharge home in medically stable   Consultants:  GI  Antibiotics:  IV Zosyn  HPI/Subjective: Antonio Ray is a pleasant 81 year old gentleman with a past medical history of chronic systolic congestive heart failure having an ejection fraction of 35%, chronic kidney disease, established history of coronary artery disease, presented to the  emergency department on 02/07/2015 with complaints of increasing cough associated with shortness of breath. Antonio Ray reporting intermittent spells of "choking up" on his food. He reports having difficulty swallowing over the past year. He had a barium swallow that was performed on 11/05/2013 that revealed distal esophageal narrowing with distal esophageal stricture. At the time he was managed conservatively, however, given the progression of symptoms he had been set up to undergo EGD in August. Workup included a two-view chest x-ray that revealed patchy airspace disease at the right lung base concerning for aspiration pneumonia. He was started on IV vancomycin and Zosyn.   Objective: Filed Vitals:   02/09/15 1418  BP: 105/50  Pulse: 67  Temp: 98.4 F (36.9 C)  Resp: 18    Intake/Output Summary (Last 24 hours) at 02/09/15 1702 Last data filed at 02/09/15 1417  Gross per 24 hour  Intake   1520 ml  Output   2550 ml  Net  -1030 ml   Filed Weights   02/07/15 1658 02/08/15 0455 02/09/15 0651  Weight: 84.233 kg (185 lb 11.2 oz) 84.324 kg (185 lb 14.4 oz) 84.214 kg (185 lb 10.5 oz)    Exam:   General:  Antonio Ray is awake and alert, nontoxic appearing, ambulating around his room  Cardiovascular: Regular rate and rhythm normal S1-S2  Respiratory: Improved lung exam  Abdomen: Soft nontender nondistended  Musculoskeletal: No extremity edema  Data Reviewed: Basic Metabolic Panel:  Recent Labs Lab 02/07/15 1300 02/08/15 0315 02/09/15 1051  NA 140 138 141  K 4.0 3.9 4.2  CL 104 104 104  CO2 28 25 30   GLUCOSE 149* 116* 202*  BUN 30* 30* 26*  CREATININE 1.80* 1.60* 1.83*  CALCIUM 9.0 8.7* 9.2   Liver Function Tests: No results for input(s): AST, ALT, ALKPHOS, BILITOT, PROT, ALBUMIN in the last  168 hours. No results for input(s): LIPASE, AMYLASE in the last 168 hours. No results for input(s): AMMONIA in the last 168 hours. CBC:  Recent Labs Lab 02/07/15 1300 02/08/15 0315  02/09/15 1051  WBC 11.7* 9.2 7.0  NEUTROABS 10.0*  --   --   HGB 12.7* 11.8* 12.6*  HCT 40.5 37.4* 39.6  MCV 88.6 90.1 88.8  PLT 211 185 208   Cardiac Enzymes:  Recent Labs Lab 02/07/15 1300  TROPONINI <0.03   BNP (last 3 results)  Recent Labs  01/13/15 2059 02/07/15 1300  BNP 176.5* 131.2*    ProBNP (last 3 results) No results for input(s): PROBNP in the last 8760 hours.  CBG:  Recent Labs Lab 02/08/15 2209 02/09/15 0551 02/09/15 0650 02/09/15 1116 02/09/15 1656  GLUCAP 244* 136* 123* 212* 158*    Recent Results (from the past 240 hour(s))  Blood culture (routine x 2)     Status: None (Preliminary result)   Collection Time: 02/07/15  1:00 PM  Result Value Ref Range Status   Specimen Description BLOOD RIGHT ANTECUBITAL  Final   Special Requests BOTTLES DRAWN AEROBIC AND ANAEROBIC 10ML  Final   Culture NO GROWTH 2 DAYS  Final   Report Status PENDING  Incomplete  Blood culture (routine x 2)     Status: None (Preliminary result)   Collection Time: 02/07/15  1:30 PM  Result Value Ref Range Status   Specimen Description BLOOD RIGHT HAND  Final   Special Requests BOTTLES DRAWN AEROBIC AND ANAEROBIC 10ML  Final   Culture NO GROWTH 2 DAYS  Final   Report Status PENDING  Incomplete  Culture, expectorated sputum-assessment     Status: None   Collection Time: 02/08/15 10:37 AM  Result Value Ref Range Status   Specimen Description SPUTUM  Final   Special Requests NONE  Final   Sputum evaluation   Final    THIS SPECIMEN IS ACCEPTABLE. RESPIRATORY CULTURE REPORT TO FOLLOW.   Report Status 02/08/2015 FINAL  Final  Culture, respiratory (NON-Expectorated)     Status: None (Preliminary result)   Collection Time: 02/08/15 10:37 AM  Result Value Ref Range Status   Specimen Description SPUTUM  Final   Special Requests NONE  Final   Gram Stain   Final    MODERATE WBC PRESENT,BOTH PMN AND MONONUCLEAR NO SQUAMOUS EPITHELIAL CELLS SEEN FEW GRAM POSITIVE COCCI IN PAIRS  FEW GRAM POSITIVE RODS Performed at [facility]    Culture   Final    Culture reincubated for better growth Performed at [facility]    Report Status PENDING  Incomplete     Studies: No results found.  Scheduled Meds: . albuterol  2.5 mg Nebulization BID  . aspirin EC  81 mg Oral Daily  . budesonide-formoterol  2 puff Inhalation BID  . cholecalciferol  2,000 Units Oral Daily  . donepezil  10 mg Oral q morning - 10a  . enoxaparin (LOVENOX) injection  40 mg Subcutaneous Q24H  . fenofibrate  160 mg Oral Daily  . furosemide  20 mg Oral Daily  . guaiFENesin  600 mg Oral BID  . insulin aspart  0-15 Units Subcutaneous TID WC  . insulin aspart  0-5 Units Subcutaneous QHS  . insulin glargine  24 Units Subcutaneous q morning - 10a  . memantine  10 mg Oral BID  . pantoprazole  20 mg Oral Daily  . piperacillin-tazobactam (ZOSYN)  IV  3.375 g Intravenous 3 times per day  . pravastatin  40 mg Oral QPM  . sodium chloride  3 mL Intravenous Q12H  . sodium chloride  3 mL Intravenous Q12H  . tiotropium  18 mcg Inhalation Daily   Continuous Infusions:   Active Problems:   Chronic systolic congestive heart failure   Diabetes mellitus type II, non insulin dependent   S/P ICD (internal cardiac defibrillator) procedure   COPD (chronic obstructive pulmonary disease)   CKD (chronic kidney disease) stage 3, GFR 30-59 ml/min   PNA (pneumonia)    Time spent: 25 min    Kelvin Cellar  [facility] Pager [phone]. If 7PM-7AM, please contact night-coverage at [email or web address], password [facility] 02/09/2015, 5:02 PM  LOS: 2 days

## 2015-02-09 NOTE — Care Management (Signed)
Important Message  Patient Details  Name: Antonio Ray MRN: [id number] Date of Birth: [birth year]   Medicare Important Message Given:  Yes-second notification given    Nathen May 02/09/2015, 2:29 PM

## 2015-02-10 ENCOUNTER — Inpatient Hospital Stay (HOSPITAL_COMMUNITY): Payer: Medicare Other

## 2015-02-10 DIAGNOSIS — R4702 Dysphasia: Secondary | ICD-10-CM

## 2015-02-10 DIAGNOSIS — J690 Pneumonitis due to inhalation of food and vomit: Secondary | ICD-10-CM | POA: Insufficient documentation

## 2015-02-10 DIAGNOSIS — R1319 Other dysphagia: Secondary | ICD-10-CM | POA: Insufficient documentation

## 2015-02-10 DIAGNOSIS — R1310 Dysphagia, unspecified: Secondary | ICD-10-CM

## 2015-02-10 DIAGNOSIS — K224 Dyskinesia of esophagus: Secondary | ICD-10-CM | POA: Insufficient documentation

## 2015-02-10 LAB — CBC
HCT: 36.9 % — ABNORMAL LOW (ref 39.0–52.0)
Hemoglobin: 11.5 g/dL — ABNORMAL LOW (ref 13.0–17.0)
MCH: 27.7 pg (ref 26.0–34.0)
MCHC: 31.2 g/dL (ref 30.0–36.0)
MCV: 88.9 fL (ref 78.0–100.0)
Platelets: 214 10*3/uL (ref 150–400)
RBC: 4.15 MIL/uL — ABNORMAL LOW (ref 4.22–5.81)
RDW: 13.9 % (ref 11.5–15.5)
WBC: 6.6 10*3/uL (ref 4.0–10.5)

## 2015-02-10 LAB — GLUCOSE, CAPILLARY
Glucose-Capillary: 126 mg/dL — ABNORMAL HIGH (ref 65–99)
Glucose-Capillary: 121 mg/dL — ABNORMAL HIGH (ref 65–99)
Glucose-Capillary: 144 mg/dL — ABNORMAL HIGH (ref 65–99)

## 2015-02-10 LAB — BASIC METABOLIC PANEL WITH GFR
Anion gap: 7 (ref 5–15)
BUN: 26 mg/dL — ABNORMAL HIGH (ref 6–20)
CO2: 30 mmol/L (ref 22–32)
Calcium: 8.9 mg/dL (ref 8.9–10.3)
Chloride: 104 mmol/L (ref 101–111)
Creatinine, Ser: 1.78 mg/dL — ABNORMAL HIGH (ref 0.61–1.24)
GFR calc Af Amer: 40 mL/min — ABNORMAL LOW
GFR calc non Af Amer: 35 mL/min — ABNORMAL LOW
Glucose, Bld: 140 mg/dL — ABNORMAL HIGH (ref 65–99)
Potassium: 4.1 mmol/L (ref 3.5–5.1)
Sodium: 141 mmol/L (ref 135–145)

## 2015-02-10 LAB — CULTURE, RESPIRATORY: CULTURE: NORMAL

## 2015-02-10 LAB — CULTURE, RESPIRATORY W GRAM STAIN

## 2015-02-10 MED ORDER — AMOXICILLIN-POT CLAVULANATE 875-125 MG PO TABS: 1.0000 | ORAL_TABLET | Freq: Two times a day (BID) | ORAL | Status: DC

## 2015-02-10 MED ORDER — AMOXICILLIN-POT CLAVULANATE 875-125 MG PO TABS
1.0000 | ORAL_TABLET | Freq: Two times a day (BID) | ORAL | Status: DC
  Filled 2015-02-10: qty 1

## 2015-02-10 NOTE — Progress Notes (Signed)
Daily Rounding Note  02/10/2015, 2:32 PM  LOS: 3 days   SUBJECTIVE:       Still with dysphagia  OBJECTIVE:         Vital signs in last 24 hours:    Temp:  [97.7 F (36.5 C)-98 F (36.7 C)] 97.7 F (36.5 C) (07/12 1409) Pulse Rate:  [66-84] 71 (07/12 1409) Resp:  [18-20] 20 (07/12 1409) BP: (98-139)/(40-70) 139/70 mmHg (07/12 1409) SpO2:  [95 %-100 %] 100 % (07/12 1409) Weight:  [185 lb 3.2 oz (84.006 kg)] 185 lb 3.2 oz (84.006 kg) (07/12 0555) Last BM Date: 02/09/15 Filed Weights   02/08/15 0455 02/09/15 0651 02/10/15 0555  Weight: 185 lb 14.4 oz (84.324 kg) 185 lb 10.5 oz (84.214 kg) 185 lb 3.2 oz (84.006 kg)   General: comfortable, chronically ill appearance.  NAD Heart: RRR Chest: clear bil Abdomen: soft, NT, ND.  BS active  Extremities: no CCE Neuro/Psych:  Pleasant, oriented x 3.  Alert.   Intake/Output from previous day: 07/11 0701 - 07/12 0700 In: 820 [P.O.:720; IV Piggyback:100] Out: 1775 [Urine:1775]  Intake/Output this shift:    Lab Results:  Recent Labs  02/08/15 0315 02/09/15 1051 02/10/15 0300  WBC 9.2 7.0 6.6  HGB 11.8* 12.6* 11.5*  HCT 37.4* 39.6 36.9*  PLT 185 208 214   BMET  Recent Labs  02/08/15 0315 02/09/15 1051 02/10/15 0300  NA 138 141 141  K 3.9 4.2 4.1  CL 104 104 104  CO2 25 30 30   GLUCOSE 116* 202* 140*  BUN 30* 26* 26*  CREATININE 1.60* 1.83* 1.78*  CALCIUM 8.7* 9.2 8.9    Studies/Results: Dg Esophagus  02/10/2015   CLINICAL DATA:  Chronic dysphagia with high clinical suspicion of recurrent aspiration pneumonia. Subsequent encounter.  EXAM: ESOPHOGRAM / BARIUM SWALLOW / BARIUM TABLET STUDY  TECHNIQUE: Combined double contrast and single contrast examination performed using thick and thin barium liquid. The patient was observed with fluoroscopy swallowing a 13 mm barium sulphate tablet.  FLUOROSCOPY TIME:  Radiation Exposure Index (as provided by the  fluoroscopic device): 465.78 uGy*m2  If the device does not provide the exposure index:  Fluoroscopy Time:  1 min and 30 seconds.  Number of Acquired Images:  1 non fluoro store image.  COMPARISON:  Chest CT 01/13/2015.  Esophagram 11/05/2013.  FINDINGS: Thin barium was initially administered in the erect position. The patient swallowed this without difficulty. Subsequently thick barium was administered. The esophagus demonstrates a decreased primary stripping wave with diffuse tertiary contractions and a corkscrew appearance. Even in the erect position, there is limited relaxation of the lower esophageal sphincter. The esophagus remains largely filled with contrast throughout the examination. No recurrent aspiration identified. No evidence of focal mucosal ulceration.  A barium tablet was administered. With repeated sips of water, the liquid barium was eventually cleared from the esophagus, although the barium tablet would not pass through the gastroesophageal junction. There is a probable small epiphrenic or gastric fundal diverticulum.  IMPRESSION: 1. Significant esophageal dysmotility with diffuse tertiary contractions and a decreased primary stripping wave. There is narrowing of the distal esophagus with limited relaxation of the lower esophageal sphincter and non passage of a barium tablet. Findings are suspicious for achalasia. 2. No mucosal ulceration or recurrent aspiration identified . 3. Endoscopic evaluation recommended.   Electronically Signed   By: Richardean Sale M.D.   On: 02/10/2015 14:21    ASSESMENT:   *  Dysphagia,  chronic.  esophagram confirms dysmotility, possibly achalasia  * PNA in pt who was discharged about one month ago on Prednisone/zithromax for COPD flare/? PNA. Off prednisone now Suspect aspiration PNA  * Advanced COPD.   * IDDM  * CKD stage 3.   PLAN   *  Soft diet.     Antonio Ray  02/10/2015, 2:32 PM Pager: [phone]

## 2015-02-10 NOTE — Progress Notes (Signed)
ANTIBIOTIC CONSULT NOTE   Pharmacy Consult for Zosyn Indication: pneumonia  Allergies  Allergen Reactions  . Codeine Other (See Comments)    Gets very angry, disoriented  . Dilaudid [Hydromorphone Hcl] Other (See Comments)    VERY AGITATED, HOSTILE  . Flomax [Tamsulosin Hcl] Shortness Of Breath  . Morphine And Related Other (See Comments)    VERY AGITATED, HOSTILE  . Sulfa Antibiotics Shortness Of Breath  . Carvedilol Other (See Comments)    DISORIENTATION  . Levofloxacin Other (See Comments)    unknown    Patient Measurements: Height: 5\' 10"  (177.8 cm) Weight: 185 lb 3.2 oz (84.006 kg) (scale a) IBW/kg (Calculated) : 73 Adjusted Body Weight:    Vital Signs: Temp: 98 F (36.7 C) (07/12 0555) Temp Source: Oral (07/12 0555) BP: 98/56 mmHg (07/12 0813) Pulse Rate: 84 (07/12 0813) Intake/Output from previous day: 07/11 0701 - 07/12 0700 In: 820 [P.O.:720; IV Piggyback:100] Out: 1775 [Urine:1775] Intake/Output from this shift:    Labs:  Recent Labs  02/08/15 0315 02/09/15 1051 02/10/15 0300  WBC 9.2 7.0 6.6  HGB 11.8* 12.6* 11.5*  PLT 185 208 214  CREATININE 1.60* 1.83* 1.78*   Estimated Creatinine Clearance: 34.7 mL/min (by C-G formula based on Cr of 1.78). No results for input(s): VANCOTROUGH, VANCOPEAK, VANCORANDOM, GENTTROUGH, GENTPEAK, GENTRANDOM, TOBRATROUGH, TOBRAPEAK, TOBRARND, AMIKACINPEAK, AMIKACINTROU, AMIKACIN in the last 72 hours.   Microbiology:   Medical History: Past Medical History  Diagnosis Date  . GERD (gastroesophageal reflux disease)   . Coronary artery disease     History of remote anterior MI with PCI to LAD in 2006; most recent cath 2007, no intervention required  . Chronic systolic dysfunction of left ventricle     EF 30-35%, CLASS II - III SYMPTOMS; intolerant to Coreg  . Hyperlipidemia   . PVC's (premature ventricular contractions)   . DOE (dyspnea on exertion)   . Intolerance, drug     Intolerant to beta blockers  . BPH  (benign prostatic hyperplasia)   . CHF (congestive heart failure)   . COPD (chronic obstructive pulmonary disease)   . AICD (automatic cardioverter/defibrillator) present 03/30/2011    Analyze ST study patient  . Heart murmur     hx  . Myocardial infarction 1994    ANTERIOR  . Pneumonia "several times"  . Type II diabetes mellitus   . CRF (chronic renal failure)   . CKD (chronic kidney disease) stage 3, GFR 30-59 ml/min 06/08/2012  . Skin cancer "several"    "forearms; head"  . Memory loss   . Anginal pain   . Dysrhythmia     Medications:  Prescriptions prior to admission  Medication Sig Dispense Refill Last Dose  . aspirin 81 MG EC tablet Take 81 mg by mouth daily.     02/07/2015 at Unknown time  . budesonide-formoterol (SYMBICORT) 160-4.5 MCG/ACT inhaler Inhale 2 puffs into the lungs 2 (two) times daily. 1 Inhaler 12 02/07/2015 at Unknown time  . Cholecalciferol (VITAMIN D PO) Take 2,000 Units by mouth daily.    02/07/2015 at Unknown time  . donepezil (ARICEPT) 10 MG tablet Take 10 mg by mouth every morning.   02/07/2015 at Unknown time  . fenofibrate (TRICOR) 145 MG tablet TAKE 1 TABLET BY MOUTH EVERY DAY 30 tablet 6 02/07/2015 at Unknown time  . furosemide (LASIX) 20 MG tablet TAKE 1 TABLET BY MOUTH EVERY DAY 90 tablet 2 02/07/2015 at Unknown time  . guaiFENesin (MUCINEX) 600 MG 12 hr tablet Take 1 tablet (600  mg total) by mouth 2 (two) times daily as needed for to loosen phlegm (for chest congestion). 30 tablet 0 Past Month at Unknown time  . insulin glargine (LANTUS) 100 UNIT/ML injection Inject 24 Units into the skin every morning.    02/07/2015 at Unknown time  . losartan (COZAAR) 25 MG tablet Take 25 mg by mouth daily.  1 02/07/2015 at Unknown time  . memantine (NAMENDA) 10 MG tablet Take 1 tablet (10 mg total) by mouth 2 (two) times daily. 60 tablet 11 02/07/2015 at Unknown time  . Multiple Vitamins-Minerals (PRESERVISION AREDS 2) CAPS Take 1 capsule by mouth 2 (two) times daily.   02/07/2015 at  Unknown time  . nitroGLYCERIN (NITRODUR - DOSED IN MG/24 HR) 0.2 mg/hr patch PLACE 1 PATCH ONTO THE SKIN DAILY 90 patch 4 02/07/2015 at Unknown time  . pantoprazole (PROTONIX) 20 MG tablet TAKE 1 TABLET BY MOUTH EVERY DAY 90 tablet 0 02/07/2015 at Unknown time  . pravastatin (PRAVACHOL) 40 MG tablet TAKE 1 TABLET BY MOUTH EVERY EVENING 90 tablet 1 02/06/2015 at Unknown time  . PROVENTIL HFA 108 (90 BASE) MCG/ACT inhaler Inhale 2 puffs into the lungs every 6 (six) hours as needed for wheezing or shortness of breath. As directed   NOT USED  . tiotropium (SPIRIVA) 18 MCG inhalation capsule Place 1 capsule (18 mcg total) into inhaler and inhale daily. 30 capsule 6 02/07/2015 at Unknown time   Assessment: SOB 81 y/o M admitted 02/07/2015 with significant PMH including a h/o CHF, COPD, recent PNA, and CKD with recent hospitalization (d/c 01/15/15) presents with SOB.   Pharmacy consulted to dose Zosyn.   Infectious Disease: aspiration PNA  Afebrile, WBC WNL Day 4 of Zosyn for asp PNA  7/9 zosyn >> 7/9 vanc>>7/10  7/9 blood x2>> 7/10 resp>>  Plan:  Continue Zosyn 3.375g IV q8hr F/U 7/10 resp cx   Wynelle Fanny 02/10/2015,11:09 AM

## 2015-02-10 NOTE — Progress Notes (Signed)
[facility] PROGRESS NOTE  Antonio Ray:[id number] DOB: 1934-04-25 DOA: 02/07/2015 PCP: [name], MD  Interim summary Patient is a pleasant 81 year old gentleman with a past medical history of chronic systolic congestive heart failure having an ejection fraction of 35%, chronic kidney disease, established history of coronary artery disease, presented to the emergency department on 02/07/2015 with complaints of increasing cough associated with shortness of breath. Patient reporting intermittent spells of "choking up" on his food. He reports having difficulty swallowing over the past year. He had a barium swallow that was performed on 11/05/2013 that revealed distal esophageal narrowing with distal esophageal stricture. At the time he was managed conservatively, however, given the progression of symptoms he had been set up to undergo EGD in August. Workup included a two-view chest x-Ray that revealed patchy airspace disease at the right lung base concerning for aspiration pneumonia. He was treated with IV Zosyn, transition to oral Augmentin on 02/10/2015 given clinical stability. He had a modified barium study done on 02/10/2015 that showed significant esophageal dysmotility and findings suspicious for achalasia. Plan for EGD in a.m.  Assessment/Plan: 1. Suspected aspiration pneumonia -Patient with history of distal esophageal stricture based on barium swallow formed on 11/05/2013 which had been managed conservatively.  -He presented with complaints of "choking up" on his food as initial chest x-Ray revealed patchy airspace disease at the right lung base which could be consistent with aspiration pneumonia. -Will transition him to Augmentin, stop Zosyn  2.  Dysphagia -Patient reporting dysphasia with liquids and solids. He had a barium swallow performed on 11/05/2013 that showed distal esophageal narrowing as well. He has been seeing Dr. [name] of GI in the outpatient setting. -GI  consulted  -He underwent a modified barium study on 02/10/2015 that showed significant esophageal dysmotility with diffuse tertiary contractions. There is narrowing at distal esophagus which could be suspicious for achalasia -Patient to undergo EGD in a.m.  3.  Chronic systolic congestive heart failure -Last transthoracic echocardiogram was performed on 01/14/2015 that showed an ejection fraction of 35-40%. Currently compensated -Continue Lasix 20 mg by mouth daily  4.  Insulin dependent diabetes mellitus -Blood sugars fluctuated between 121 and 178 -Continue home regimen with Lantus 24 units subcutaneous daily and sliding scale coverage  5.  Coronary artery disease -Stable, patient denies chest pain. Suspect shortness of breath is probably related to aspiration -Continue aspirin  Code Status: Full code Family Communication: I spoke with his son over telephone conversation Disposition Plan: GI consulted, anticipate discharge home in medically stable   Consultants:  GI  Speech pathology  Antibiotics:  IV Zosyn stopped on 02/10/2015  Augmentin started on 02/10/2015  HPI/Subjective: Patient reports overall doing well, he continues to complain of sensation of food getting stuck in his esophagus. He denies any choking spells so long as he eats slowly.   Objective: Filed Vitals:   02/10/15 1409  BP: 139/70  Pulse: 71  Temp: 97.7 F (36.5 C)  Resp: 20    Intake/Output Summary (Last 24 hours) at 02/10/15 1606 Last data filed at 02/10/15 1553  Gross per 24 hour  Intake    340 ml  Output   1820 ml  Net  -1480 ml   Filed Weights   02/08/15 0455 02/09/15 0651 02/10/15 0555  Weight: 84.324 kg (185 lb 14.4 oz) 84.214 kg (185 lb 10.5 oz) 84.006 kg (185 lb 3.2 oz)    Exam:   General:  Patient is awake and alert, nontoxic appearing, ambulating around his  room  Cardiovascular: Regular rate and rhythm normal S1-S2  Respiratory: Lungs overall clear to auscultation  bilaterally no wheezing rhonchi or rales  Abdomen: Soft nontender nondistended  Musculoskeletal: No extremity edema  Data Reviewed: Basic Metabolic Panel:  Recent Labs Lab 02/07/15 1300 02/08/15 0315 02/09/15 1051 02/10/15 0300  NA 140 138 141 141  K 4.0 3.9 4.2 4.1  CL 104 104 104 104  CO2 28 25 30 30   GLUCOSE 149* 116* 202* 140*  BUN 30* 30* 26* 26*  CREATININE 1.80* 1.60* 1.83* 1.78*  CALCIUM 9.0 8.7* 9.2 8.9   Liver Function Tests: No results for input(s): AST, ALT, ALKPHOS, BILITOT, PROT, ALBUMIN in the last 168 hours. No results for input(s): LIPASE, AMYLASE in the last 168 hours. No results for input(s): AMMONIA in the last 168 hours. CBC:  Recent Labs Lab 02/07/15 1300 02/08/15 0315 02/09/15 1051 02/10/15 0300  WBC 11.7* 9.2 7.0 6.6  NEUTROABS 10.0*  --   --   --   HGB 12.7* 11.8* 12.6* 11.5*  HCT 40.5 37.4* 39.6 36.9*  MCV 88.6 90.1 88.8 88.9  PLT 211 185 208 214   Cardiac Enzymes:  Recent Labs Lab 02/07/15 1300  TROPONINI <0.03   BNP (last 3 results)  Recent Labs  01/13/15 2059 02/07/15 1300  BNP 176.5* 131.2*    ProBNP (last 3 results) No results for input(s): PROBNP in the last 8760 hours.  CBG:  Recent Labs Lab 02/09/15 1656 02/09/15 2150 02/10/15 0558 02/10/15 1116 02/10/15 1551  GLUCAP 158* 178* 126* 121* 144*    Recent Results (from the past 240 hour(s))  Blood culture (routine x 2)     Status: None (Preliminary result)   Collection Time: 02/07/15  1:00 PM  Result Value Ref Range Status   Specimen Description BLOOD RIGHT ANTECUBITAL  Final   Special Requests BOTTLES DRAWN AEROBIC AND ANAEROBIC 10ML  Final   Culture NO GROWTH 3 DAYS  Final   Report Status PENDING  Incomplete  Blood culture (routine x 2)     Status: None (Preliminary result)   Collection Time: 02/07/15  1:30 PM  Result Value Ref Range Status   Specimen Description BLOOD RIGHT HAND  Final   Special Requests BOTTLES DRAWN AEROBIC AND ANAEROBIC 10ML   Final   Culture NO GROWTH 3 DAYS  Final   Report Status PENDING  Incomplete  Culture, expectorated sputum-assessment     Status: None   Collection Time: 02/08/15 10:37 AM  Result Value Ref Range Status   Specimen Description SPUTUM  Final   Special Requests NONE  Final   Sputum evaluation   Final    THIS SPECIMEN IS ACCEPTABLE. RESPIRATORY CULTURE REPORT TO FOLLOW.   Report Status 02/08/2015 FINAL  Final  Culture, respiratory (NON-Expectorated)     Status: None   Collection Time: 02/08/15 10:37 AM  Result Value Ref Range Status   Specimen Description SPUTUM  Final   Special Requests NONE  Final   Gram Stain   Final    MODERATE WBC PRESENT,BOTH PMN AND MONONUCLEAR NO SQUAMOUS EPITHELIAL CELLS SEEN FEW GRAM POSITIVE COCCI IN PAIRS FEW GRAM POSITIVE RODS Performed at [facility]    Culture   Final    NORMAL OROPHARYNGEAL FLORA Performed at [facility]    Report Status 02/10/2015 FINAL  Final     Studies: Dg Esophagus  02/10/2015   CLINICAL DATA:  Chronic dysphagia with high clinical suspicion of recurrent aspiration pneumonia. Subsequent encounter.  EXAM: ESOPHOGRAM /  BARIUM SWALLOW / BARIUM TABLET STUDY  TECHNIQUE: Combined double contrast and single contrast examination performed using thick and thin barium liquid. The patient was observed with fluoroscopy swallowing a 13 mm barium sulphate tablet.  FLUOROSCOPY TIME:  Radiation Exposure Index (as provided by the fluoroscopic device): 465.78 uGy*m2  If the device does not provide the exposure index:  Fluoroscopy Time:  1 min and 30 seconds.  Number of Acquired Images:  1 non fluoro store image.  COMPARISON:  Chest CT 01/13/2015.  Esophagram 11/05/2013.  FINDINGS: Thin barium was initially administered in the erect position. The patient swallowed this without difficulty. Subsequently thick barium was administered. The esophagus demonstrates a decreased primary stripping wave with diffuse tertiary contractions and a  corkscrew appearance. Even in the erect position, there is limited relaxation of the lower esophageal sphincter. The esophagus remains largely filled with contrast throughout the examination. No recurrent aspiration identified. No evidence of focal mucosal ulceration.  A barium tablet was administered. With repeated sips of water, the liquid barium was eventually cleared from the esophagus, although the barium tablet would not pass through the gastroesophageal junction. There is a probable small epiphrenic or gastric fundal diverticulum.  IMPRESSION: 1. Significant esophageal dysmotility with diffuse tertiary contractions and a decreased primary stripping wave. There is narrowing of the distal esophagus with limited relaxation of the lower esophageal sphincter and non passage of a barium tablet. Findings are suspicious for achalasia. 2. No mucosal ulceration or recurrent aspiration identified . 3. Endoscopic evaluation recommended.   Electronically Signed   By: Richardean Sale M.D.   On: 02/10/2015 14:21    Scheduled Meds: . albuterol  2.5 mg Nebulization BID  . aspirin EC  81 mg Oral Daily  . budesonide-formoterol  2 puff Inhalation BID  . cholecalciferol  2,000 Units Oral Daily  . donepezil  10 mg Oral q morning - 10a  . enoxaparin (LOVENOX) injection  40 mg Subcutaneous Q24H  . fenofibrate  160 mg Oral Daily  . furosemide  20 mg Oral Daily  . guaiFENesin  600 mg Oral BID  . insulin aspart  0-15 Units Subcutaneous TID WC  . insulin aspart  0-5 Units Subcutaneous QHS  . insulin glargine  24 Units Subcutaneous q morning - 10a  . memantine  10 mg Oral BID  . pantoprazole  20 mg Oral Daily  . piperacillin-tazobactam (ZOSYN)  IV  3.375 g Intravenous 3 times per day  . pravastatin  40 mg Oral QPM  . sodium chloride  3 mL Intravenous Q12H  . sodium chloride  3 mL Intravenous Q12H  . tiotropium  18 mcg Inhalation Daily   Continuous Infusions:   Active Problems:   Chronic systolic congestive  heart failure   Diabetes mellitus type II, non insulin dependent   S/P ICD (internal cardiac defibrillator) procedure   COPD (chronic obstructive pulmonary disease)   CKD (chronic kidney disease) stage 3, GFR 30-59 ml/min   PNA (pneumonia)    Time spent: 25 min    Kelvin Cellar  [facility] Pager [phone]. If 7PM-7AM, please contact night-coverage at [email or web address], password [facility] 02/10/2015, 4:06 PM  LOS: 3 days

## 2015-02-10 NOTE — Discharge Summary (Signed)
Physician Discharge Summary  Antonio Ray BUL:[id number] DOB: 21-May-1934 DOA: 02/07/2015  PCP: [name], MD  Admit date: 02/07/2015 Discharge date: 02/10/2015  Time spent: 35 minutes  Recommendations for Outpatient Follow-up:  1. Patient will follow up on GI to undergo Manometry, given suspected achalasia. This could be treated with Botox injections   Discharge Diagnoses:  Active Problems:   Chronic systolic congestive heart failure   Diabetes mellitus type II, non insulin dependent   S/P ICD (internal cardiac defibrillator) procedure   COPD (chronic obstructive pulmonary disease)   CKD (chronic kidney disease) stage 3, GFR 30-59 ml/min   PNA (pneumonia)   Dysphagia   Aspiration pneumonia   Discharge Condition: Stable  Diet recommendation: Heart healthy  Filed Weights   02/08/15 0455 02/09/15 0651 02/10/15 0555  Weight: 84.324 kg (185 lb 14.4 oz) 84.214 kg (185 lb 10.5 oz) 84.006 kg (185 lb 3.2 oz)    History of present illness:  Antonio Ray is a 81 y.o. male with PMH significant for Chronic Systolic HF EF 35 % ECHO 3212, COPD, CKD Cr last admission was at 1.7, last discharge from [facility] 01-15-2015 treated at that time for COPD exacerbation, PNA, Acute on chronic renal failure, who presents complaining of SOB. Patient started to have worsening cough over last week, productive , green sputum. Also worsening SOB, worse since yesterday. He has been feeling dizziness, chills. He finished antibiotics from last admission. He has history of esophageal stricture, he plan to follow up with Dr [name] for dilation. Patient denies food getting stock. Does relates some reflux, and back up of fluids.  Patient denies chest pain, abdominal pain, or diarrhea.  Patient has notice cough while eating.  Evaluation in the ED; Chest x ray with Air space disease right lung base. WBC at 11, Cr at 1.8, BNP 131, troponin negative, mild fevers/   Hospital Course:  Patient is a pleasant  81 year old gentleman with a past medical history of chronic systolic congestive heart failure having an ejection fraction of 35%, chronic kidney disease, established history of coronary artery disease, presented to the emergency department on 02/07/2015 with complaints of increasing cough associated with shortness of breath. Patient reporting intermittent spells of "choking up" on his food. He reports having difficulty swallowing over the past year. He had a barium swallow that was performed on 11/05/2013 that revealed distal esophageal narrowing with distal esophageal stricture. At the time he was managed conservatively, however, given the progression of symptoms he had been set up to undergo EGD in August. Workup included a two-view chest x-ray that revealed patchy airspace disease at the right lung base concerning for aspiration pneumonia. He was treated with IV Zosyn, transition to oral Augmentin on 02/10/2015 given clinical stability. He had a modified barium study done on 02/10/2015 that showed significant esophageal dysmotility and findings suspicious for achalasia.    Suspected aspiration pneumonia -Patient with history of distal esophageal stricture based on barium swallow formed on 11/05/2013 which had been managed conservatively.  -He presented with complaints of "choking up" on his food as initial chest x-ray revealed patchy airspace disease at the right lung base which could be consistent with aspiration pneumonia. -On 02/10/2015 he was transitioned to Augmentin -He remains nontoxic, afebrile, stable for discharge.  2. Dysphagia -Patient reporting dysphasia with liquids and solids. He had a barium swallow performed on 11/05/2013 that showed distal esophageal narrowing as well. He has been seeing Dr. [name] of GI in the outpatient setting. -GI consulted  -He  underwent a modified barium study on 02/10/2015 that showed significant esophageal dysmotility with diffuse tertiary contractions.  There is narrowing at distal esophagus which could be suspicious for achalasia -Case was discussed with Dr. Hilarie Fredrickson who recommended proceeding with manometry which could be performed in the office. After this he would likely undergo EGD.  -GI did not recommend further inpatient workup  3. Chronic systolic congestive heart failure -Last transthoracic echocardiogram was performed on 01/14/2015 that showed an ejection fraction of 35-40%. Currently compensated -Continue Lasix 20 mg by mouth daily  4. Insulin dependent diabetes mellitus -Blood sugars fluctuated between 121 and 178 -Continue home regimen with Lantus 24 units subcutaneous daily and sliding scale coverage  Procedures:  Barium swallow performed on 02/10/2015 impression:  Significant esophageal dysmotility with diffuse tertiary contractions and a decreased primary stripping wave. There is narrowing of the distal esophagus with limited relaxation of the lower esophageal sphincter and non passage of a barium tablet. Findings are suspicious for achalasia.  Consultations:  GI   Discharge Exam: Filed Vitals:   02/10/15 1409  BP: 139/70  Pulse: 71  Temp: 97.7 F (36.5 C)  Resp: 20     General: Patient is awake and alert, nontoxic appearing, ambulating around his room  Cardiovascular: Regular rate and rhythm normal S1-S2  Respiratory: Lungs overall clear to auscultation bilaterally no wheezing rhonchi or rales  Abdomen: Soft nontender nondistended  Musculoskeletal: No extremity edema  Discharge Instructions   Discharge Instructions    Call MD for:  difficulty breathing, headache or visual disturbances    Complete by:  As directed      Call MD for:  extreme fatigue    Complete by:  As directed      Call MD for:  hives    Complete by:  As directed      Call MD for:  persistant dizziness or light-headedness    Complete by:  As directed      Call MD for:  persistant nausea and vomiting    Complete by:  As  directed      Call MD for:  redness, tenderness, or signs of infection (pain, swelling, redness, odor or green/yellow discharge around incision site)    Complete by:  As directed      Call MD for:  severe uncontrolled pain    Complete by:  As directed      Call MD for:  temperature >100.4    Complete by:  As directed      Call MD for:    Complete by:  As directed      Diet - low sodium heart healthy    Complete by:  As directed      Increase activity slowly    Complete by:  As directed           Current Discharge Medication List    START taking these medications   Details  amoxicillin-clavulanate (AUGMENTIN) 875-125 MG per tablet Take 1 tablet by mouth every 12 (twelve) hours. Qty: 6 tablet, Refills: 0      CONTINUE these medications which have NOT CHANGED   Details  aspirin 81 MG EC tablet Take 81 mg by mouth daily.      budesonide-formoterol (SYMBICORT) 160-4.5 MCG/ACT inhaler Inhale 2 puffs into the lungs 2 (two) times daily. Qty: 1 Inhaler, Refills: 12    Cholecalciferol (VITAMIN D PO) Take 2,000 Units by mouth daily.     donepezil (ARICEPT) 10 MG tablet Take 10 mg by mouth every  morning.    fenofibrate (TRICOR) 145 MG tablet TAKE 1 TABLET BY MOUTH EVERY DAY Qty: 30 tablet, Refills: 6    furosemide (LASIX) 20 MG tablet TAKE 1 TABLET BY MOUTH EVERY DAY Qty: 90 tablet, Refills: 2    guaiFENesin (MUCINEX) 600 MG 12 hr tablet Take 1 tablet (600 mg total) by mouth 2 (two) times daily as needed for to loosen phlegm (for chest congestion). Qty: 30 tablet, Refills: 0    insulin glargine (LANTUS) 100 UNIT/ML injection Inject 24 Units into the skin every morning.     memantine (NAMENDA) 10 MG tablet Take 1 tablet (10 mg total) by mouth 2 (two) times daily. Qty: 60 tablet, Refills: 11    Multiple Vitamins-Minerals (PRESERVISION AREDS 2) CAPS Take 1 capsule by mouth 2 (two) times daily.    pantoprazole (PROTONIX) 20 MG tablet TAKE 1 TABLET BY MOUTH EVERY DAY Qty: 90  tablet, Refills: 0    pravastatin (PRAVACHOL) 40 MG tablet TAKE 1 TABLET BY MOUTH EVERY EVENING Qty: 90 tablet, Refills: 1    PROVENTIL HFA 108 (90 BASE) MCG/ACT inhaler Inhale 2 puffs into the lungs every 6 (six) hours as needed for wheezing or shortness of breath. As directed    tiotropium (SPIRIVA) 18 MCG inhalation capsule Place 1 capsule (18 mcg total) into inhaler and inhale daily. Qty: 30 capsule, Refills: 6      STOP taking these medications     losartan (COZAAR) 25 MG tablet      nitroGLYCERIN (NITRODUR - DOSED IN MG/24 HR) 0.2 mg/hr patch        Allergies  Allergen Reactions  . Codeine Other (See Comments)    Gets very angry, disoriented  . Dilaudid [Hydromorphone Hcl] Other (See Comments)    VERY AGITATED, HOSTILE  . Flomax [Tamsulosin Hcl] Shortness Of Breath  . Morphine And Related Other (See Comments)    VERY AGITATED, HOSTILE  . Sulfa Antibiotics Shortness Of Breath  . Carvedilol Other (See Comments)    DISORIENTATION  . Levofloxacin Other (See Comments)    unknown   Follow-up Information    Follow up with [name], MD.   Specialty:  Family Medicine   Contact information:   [place] [place] [address] [phone]       Follow up with PYRTLE, Lajuan Lines, MD In 1 week.   Specialty:  Gastroenterology   Contact information:   [address]  [place] [address] [phone]       Follow up with [name], MD.   Specialty:  [facility]   Contact information:   [place] [address] [phone]        The results of significant diagnostics from this hospitalization (including imaging, microbiology, ancillary and laboratory) are listed below for reference.    Significant Diagnostic Studies: Dg Chest 2 View  02/07/2015   CLINICAL DATA:  Short of breath  EXAM: CHEST  2 VIEW  COMPARISON:  01/13/2015  FINDINGS: There are ill-defined patchy opacities at the right lung base. Left lung is grossly clear.  Dual lead right subclavian pacemaker device and AICD device are stable. Hyperaeration. No pneumothorax or pleural effusion.  IMPRESSION: Patchy airspace disease at the right lung base. Followup PA and lateral chest X-ray is recommended in 3-4 weeks following trial of antibiotic therapy to ensure resolution and exclude underlying malignancy.   Electronically Signed   By: Marybelle Killings M.D.   On: 02/07/2015 14:25   Dg Chest 2 View  01/13/2015   CLINICAL DATA:  One day history of cough and shortness of breath  EXAM: CHEST  2 VIEW  COMPARISON:  Chest radiograph January 03, 2014 and chest CT January 14, 2014  FINDINGS: There are scattered areas of mild scarring, primarily in the lower lung zones. There is no frank edema or consolidation. The heart size and pulmonary vascularity are within normal limits. Pacemaker lead tips are attached to the right atrium and right ventricle. No adenopathy. No bone lesions.  IMPRESSION: No edema or consolidation. Scattered areas of mild scarring in the lower lung zones.   Electronically Signed   By: Lowella Grip III M.D.   On: 01/13/2015 21:50   Ct Angio Chest Pe W/cm &/or Wo Cm  01/14/2015   CLINICAL DATA:  Shortness of breath starting around 4 a.m. yesterday morning.  EXAM: CT ANGIOGRAPHY CHEST WITH CONTRAST  TECHNIQUE: Multidetector CT imaging of the chest was performed using the standard protocol during bolus administration of intravenous contrast. Multiplanar CT image reconstructions and MIPs were obtained to evaluate the vascular anatomy.  CONTRAST:  10mL OMNIPAQUE IOHEXOL 350 MG/ML SOLN  COMPARISON:  01/14/2014  FINDINGS: Technically adequate study with good opacification of the central and segmental pulmonary arteries. No focal filling defects are demonstrated. No evidence of significant pulmonary embolus.  Normal heart size. There appears to be a small apical left ventricular aneurysm containing thrombus. Coronary artery calcifications. Normal caliber thoracic aorta without  aneurysm or dissection. Aortic calcification. Great vessel origins are patent. Scattered lymph nodes in the mediastinum are not pathologically enlarged. Esophagus is mildly dilated with mildly thickened wall and air-fluid level. This could indicate dysmotility, reflux, or achalasia.  Evaluation of lungs is limited due to respiratory motion artifact. There appears to be some patchy areas of airspace disease and tree-in-bud infiltrates in the lung bases, greater on the right. This may represent pneumonia or small airways disease. Airways appear patent. No focal consolidation. No pneumothorax. No pleural effusions.  Included portions of the upper abdominal organs demonstrate surgical absence of the gallbladder. Configuration of the liver suggest possible hepatic cirrhosis. Fatty infiltration of the pancreas. Degenerative changes in the spine. No destructive bone lesions.  Review of the MIP images confirms the above findings.  IMPRESSION: No evidence of significant pulmonary embolus. Apical left ventricular aneurysm containing thrombus. Dilatation of the esophagus with air-fluid level suggesting reflux or achalasia. Patchy airspace disease in the lung bases, greater on the right may indicate pneumonia or small airways disease.   Electronically Signed   By: Lucienne Capers M.D.   On: 01/14/2015 00:49   Dg Esophagus  02/10/2015   CLINICAL DATA:  Chronic dysphagia with high clinical suspicion of recurrent aspiration pneumonia. Subsequent encounter.  EXAM: ESOPHOGRAM / BARIUM SWALLOW / BARIUM TABLET STUDY  TECHNIQUE: Combined double contrast and single contrast examination performed using thick and thin barium liquid. The patient was observed with fluoroscopy swallowing a 13 mm barium sulphate tablet.  FLUOROSCOPY TIME:  Radiation Exposure Index (as provided by the fluoroscopic device): 465.78 uGy*m2  If the device does not provide the exposure index:  Fluoroscopy Time:  1 min and 30 seconds.  Number of Acquired Images:   1 non fluoro store image.  COMPARISON:  Chest CT 01/13/2015.  Esophagram 11/05/2013.  FINDINGS: Thin barium was initially administered in the erect position. The patient swallowed this without difficulty. Subsequently thick barium was administered. The esophagus demonstrates a decreased primary stripping wave with diffuse tertiary contractions and a corkscrew appearance. Even in the  erect position, there is limited relaxation of the lower esophageal sphincter. The esophagus remains largely filled with contrast throughout the examination. No recurrent aspiration identified. No evidence of focal mucosal ulceration.  A barium tablet was administered. With repeated sips of water, the liquid barium was eventually cleared from the esophagus, although the barium tablet would not pass through the gastroesophageal junction. There is a probable small epiphrenic or gastric fundal diverticulum.  IMPRESSION: 1. Significant esophageal dysmotility with diffuse tertiary contractions and a decreased primary stripping wave. There is narrowing of the distal esophagus with limited relaxation of the lower esophageal sphincter and non passage of a barium tablet. Findings are suspicious for achalasia. 2. No mucosal ulceration or recurrent aspiration identified . 3. Endoscopic evaluation recommended.   Electronically Signed   By: Richardean Sale M.D.   On: 02/10/2015 14:21    Microbiology: Recent Results (from the past 240 hour(s))  Blood culture (routine x 2)     Status: None (Preliminary result)   Collection Time: 02/07/15  1:00 PM  Result Value Ref Range Status   Specimen Description BLOOD RIGHT ANTECUBITAL  Final   Special Requests BOTTLES DRAWN AEROBIC AND ANAEROBIC 10ML  Final   Culture NO GROWTH 3 DAYS  Final   Report Status PENDING  Incomplete  Blood culture (routine x 2)     Status: None (Preliminary result)   Collection Time: 02/07/15  1:30 PM  Result Value Ref Range Status   Specimen Description BLOOD RIGHT HAND   Final   Special Requests BOTTLES DRAWN AEROBIC AND ANAEROBIC 10ML  Final   Culture NO GROWTH 3 DAYS  Final   Report Status PENDING  Incomplete  Culture, expectorated sputum-assessment     Status: None   Collection Time: 02/08/15 10:37 AM  Result Value Ref Range Status   Specimen Description SPUTUM  Final   Special Requests NONE  Final   Sputum evaluation   Final    THIS SPECIMEN IS ACCEPTABLE. RESPIRATORY CULTURE REPORT TO FOLLOW.   Report Status 02/08/2015 FINAL  Final  Culture, respiratory (NON-Expectorated)     Status: None   Collection Time: 02/08/15 10:37 AM  Result Value Ref Range Status   Specimen Description SPUTUM  Final   Special Requests NONE  Final   Gram Stain   Final    MODERATE WBC PRESENT,BOTH PMN AND MONONUCLEAR NO SQUAMOUS EPITHELIAL CELLS SEEN FEW GRAM POSITIVE COCCI IN PAIRS FEW GRAM POSITIVE RODS Performed at [facility]    Culture   Final    NORMAL OROPHARYNGEAL FLORA Performed at [facility]    Report Status 02/10/2015 FINAL  Final     Labs: Basic Metabolic Panel:  Recent Labs Lab 02/07/15 1300 02/08/15 0315 02/09/15 1051 02/10/15 0300  NA 140 138 141 141  K 4.0 3.9 4.2 4.1  CL 104 104 104 104  CO2 28 25 30 30   GLUCOSE 149* 116* 202* 140*  BUN 30* 30* 26* 26*  CREATININE 1.80* 1.60* 1.83* 1.78*  CALCIUM 9.0 8.7* 9.2 8.9   Liver Function Tests: No results for input(s): AST, ALT, ALKPHOS, BILITOT, PROT, ALBUMIN in the last 168 hours. No results for input(s): LIPASE, AMYLASE in the last 168 hours. No results for input(s): AMMONIA in the last 168 hours. CBC:  Recent Labs Lab 02/07/15 1300 02/08/15 0315 02/09/15 1051 02/10/15 0300  WBC 11.7* 9.2 7.0 6.6  NEUTROABS 10.0*  --   --   --   HGB 12.7* 11.8* 12.6* 11.5*  HCT 40.5 37.4*  39.6 36.9*  MCV 88.6 90.1 88.8 88.9  PLT 211 185 208 214   Cardiac Enzymes:  Recent Labs Lab 02/07/15 1300  TROPONINI <0.03   BNP: BNP (last 3 results)  Recent Labs   01/13/15 2059 02/07/15 1300  BNP 176.5* 131.2*    ProBNP (last 3 results) No results for input(s): PROBNP in the last 8760 hours.  CBG:  Recent Labs Lab 02/09/15 1656 02/09/15 2150 02/10/15 0558 02/10/15 1116 02/10/15 1551  GLUCAP 158* 178* 126* 121* 144*       Signed:  Jori Thrall  [facility] 02/10/2015, 5:31 PM

## 2015-02-10 NOTE — Progress Notes (Signed)
SLP Cancellation Note  [name] MRN: [id number] DOB: 1933-12-10   Cancelled treatment:       Reason Eval/Treat Not Completed: Other (comment). Awaiting GI procedures to be completed (esophagram, endoscopy or manometry) prior to f/u. Plan to reinforce esophageal precautions and strategies prior to dc. Following chart.    Clemmie Marxen, Katherene Ponto 02/10/2015, 11:57 AM

## 2015-02-11 ENCOUNTER — Telehealth: Payer: Self-pay

## 2015-02-11 SURGERY — EGD (ESOPHAGOGASTRODUODENOSCOPY)
Anesthesia: Moderate Sedation

## 2015-02-11 NOTE — Telephone Encounter (Signed)
-----   Message from Jerene Bears, MD sent at 02/10/2015  9:41 PM EDT ----- Regarding: MANO Pt needs esophageal mano ASAP Thanks JMP

## 2015-02-11 NOTE — Telephone Encounter (Signed)
Pt needs EM. Order in epic.

## 2015-02-12 LAB — CULTURE, BLOOD (ROUTINE X 2)
Culture: NO GROWTH
Culture: NO GROWTH

## 2015-02-13 NOTE — Telephone Encounter (Signed)
Pt scheduled for EM at [facility] 03/16/15@1 :30pm. Pt to arrive there at 1pm.

## 2015-02-17 NOTE — Telephone Encounter (Signed)
Spoke with pts son and he is aware of the appt. Prep instructions mailed to pt.

## 2015-02-17 NOTE — Telephone Encounter (Signed)
Pts son called and states he is teaching a class now but to try calling him back around 4pm at [phone].

## 2015-02-20 ENCOUNTER — Other Ambulatory Visit: Payer: Self-pay | Admitting: Cardiology

## 2015-02-20 NOTE — Telephone Encounter (Signed)
Rx(s) sent to pharmacy electronically.  

## 2015-02-23 ENCOUNTER — Telehealth: Payer: Self-pay

## 2015-02-23 NOTE — Telephone Encounter (Signed)
Son calls with concerns about the patient's fasting status for his esophageal manometry. He is diabetic and takes his basal insulin in the mornings. He will hold the Lantus that morning. He has not been eating very well and his fasting AM glucose readings are around 90. There are concerns about hypoglycemia. Spoke with Linna Hoff at [facility] [employer]. He is okay with the patient eating a breakfast and then to begin his fast at [place]. Cautioned not to eat a heavy breakfast. Son expresses understanding of the new instructions.

## 2015-02-24 ENCOUNTER — Encounter: Payer: Self-pay | Admitting: Gastroenterology

## 2015-03-03 ENCOUNTER — Encounter: Payer: Self-pay | Admitting: Internal Medicine

## 2015-03-12 ENCOUNTER — Ambulatory Visit: Payer: Medicare Other | Admitting: Gastroenterology

## 2015-03-12 ENCOUNTER — Other Ambulatory Visit: Payer: Self-pay | Admitting: *Deleted

## 2015-03-12 MED ORDER — TIOTROPIUM BROMIDE MONOHYDRATE 18 MCG IN CAPS: 18.0000 ug | ORAL_CAPSULE | Freq: Every day | RESPIRATORY_TRACT | Status: DC

## 2015-03-16 ENCOUNTER — Ambulatory Visit (HOSPITAL_COMMUNITY)
Admission: RE | Admit: 2015-03-16 | Discharge: 2015-03-16 | Disposition: A | Discharge status: Home / Self Care | Payer: Medicare Other | Source: Ambulatory Visit | Attending: Internal Medicine | Admitting: Internal Medicine

## 2015-03-16 ENCOUNTER — Encounter (HOSPITAL_COMMUNITY)
Admission: RE | Disposition: A | Discharge status: Home / Self Care | Payer: Self-pay | Source: Ambulatory Visit | Attending: Internal Medicine

## 2015-03-16 DIAGNOSIS — K219 Gastro-esophageal reflux disease without esophagitis: Secondary | ICD-10-CM

## 2015-03-16 DIAGNOSIS — K220 Achalasia of cardia: Secondary | ICD-10-CM | POA: Diagnosis not present

## 2015-03-16 DIAGNOSIS — R1310 Dysphagia, unspecified: Secondary | ICD-10-CM | POA: Diagnosis not present

## 2015-03-16 HISTORY — PX: ESOPHAGEAL MANOMETRY: SHX5429

## 2015-03-16 SURGERY — MANOMETRY, ESOPHAGUS

## 2015-03-16 MED ORDER — LIDOCAINE VISCOUS 2 % MT SOLN
OROMUCOSAL | Status: AC
  Filled 2015-03-16: qty 15

## 2015-03-16 SURGICAL SUPPLY — 2 items
FACESHIELD LNG OPTICON STERILE (SAFETY) IMPLANT
GLOVE BIO SURGEON STRL SZ8 (GLOVE) ×4 IMPLANT

## 2015-03-17 ENCOUNTER — Encounter (HOSPITAL_COMMUNITY): Payer: Self-pay | Admitting: Internal Medicine

## 2015-03-18 ENCOUNTER — Ambulatory Visit (INDEPENDENT_AMBULATORY_CARE_PROVIDER_SITE_OTHER): Payer: Medicare Other | Admitting: Internal Medicine

## 2015-03-18 ENCOUNTER — Ambulatory Visit (INDEPENDENT_AMBULATORY_CARE_PROVIDER_SITE_OTHER)
Admission: RE | Admit: 2015-03-18 | Discharge: 2015-03-18 | Disposition: A | Discharge status: Home / Self Care | Payer: Medicare Other | Source: Ambulatory Visit | Attending: Internal Medicine | Admitting: Internal Medicine

## 2015-03-18 ENCOUNTER — Ambulatory Visit (INDEPENDENT_AMBULATORY_CARE_PROVIDER_SITE_OTHER): Payer: Medicare Other | Admitting: Cardiology

## 2015-03-18 ENCOUNTER — Telehealth: Payer: Self-pay | Admitting: Internal Medicine

## 2015-03-18 ENCOUNTER — Encounter: Payer: Self-pay | Admitting: Cardiology

## 2015-03-18 ENCOUNTER — Ambulatory Visit: Payer: Federal, State, Local not specified - PPO | Admitting: Pulmonary Disease

## 2015-03-18 ENCOUNTER — Encounter: Payer: Self-pay | Admitting: Internal Medicine

## 2015-03-18 VITALS — BP 116/58 | HR 70 | Ht 67.0 in | Wt 186.2 lb

## 2015-03-18 VITALS — BP 106/52 | HR 68 | Ht 70.0 in | Wt 185.0 lb

## 2015-03-18 DIAGNOSIS — I5022 Chronic systolic (congestive) heart failure: Secondary | ICD-10-CM

## 2015-03-18 DIAGNOSIS — J189 Pneumonia, unspecified organism: Secondary | ICD-10-CM | POA: Diagnosis not present

## 2015-03-18 DIAGNOSIS — Z95810 Presence of automatic (implantable) cardiac defibrillator: Secondary | ICD-10-CM

## 2015-03-18 DIAGNOSIS — J690 Pneumonitis due to inhalation of food and vomit: Secondary | ICD-10-CM

## 2015-03-18 DIAGNOSIS — I255 Ischemic cardiomyopathy: Secondary | ICD-10-CM

## 2015-03-18 DIAGNOSIS — N183 Chronic kidney disease, stage 3 unspecified: Secondary | ICD-10-CM

## 2015-03-18 DIAGNOSIS — Z950 Presence of cardiac pacemaker: Secondary | ICD-10-CM | POA: Diagnosis not present

## 2015-03-18 DIAGNOSIS — J449 Chronic obstructive pulmonary disease, unspecified: Secondary | ICD-10-CM

## 2015-03-18 DIAGNOSIS — I2510 Atherosclerotic heart disease of native coronary artery without angina pectoris: Secondary | ICD-10-CM | POA: Diagnosis not present

## 2015-03-18 DIAGNOSIS — J439 Emphysema, unspecified: Secondary | ICD-10-CM

## 2015-03-18 MED ORDER — LOSARTAN POTASSIUM 25 MG PO TABS: 25.0000 mg | ORAL_TABLET | Freq: Every day | ORAL | Status: DC

## 2015-03-18 NOTE — Patient Instructions (Addendum)
Work on Engineer, technical sales technique:  relax and gently blow all the way out then take a nice smooth deep breath back in, triggering the inhaler at same time you start breathing in.  Hold for up to 5 seconds if you can. Blow out thru nose. Rinse and gargle with water when done  Please remember to go to the  x-ray department downstairs for your tests - we will call you with the results when they are available.     Please schedule a follow up visit in 3 months but call sooner if needed Dr Lake Bells

## 2015-03-18 NOTE — Telephone Encounter (Signed)
Spoke with pts son and he is concerned because 1st available appt is at the beginning of October. Son would like him seen sooner. Dr. Hilarie Fredrickson do you want pt worked in sooner? Please advise. Son aware of results.

## 2015-03-18 NOTE — Progress Notes (Signed)
Subjective:    Patient ID: Antonio Ray, male    DOB: Feb 25, 1934 .   MRN: [id number]  Brief patient profile:  First the [place] [facility] 10/07/13  2015 for severe COPD with  FEV1   29% predicted. He also has a scar in his right lung base believed to be related to pneumonia.    History of Present Illness  Ov 08/11/14 Antonio Ray  Chief Complaint  Patient presents with  . Follow-up    Pt states his breathing is at baseline: no complaints today.    Antonio Ray has been stable since the last visit.  He has been not been exercising as much due to the rain and bad weather and a recent diabetic foot infection.  He notes some shortness of breath with strenuous activity but it is not slowing him down at all.  He has cataract surgery planned for tomorrow morning.  He and his son are considering joining a gym in the next few weeks.  rec Change to the Spiriva handihaler once daily Keep taking the Symbicort Get the Prevnar vaccine with your primary care doctor    Admit date: 02/07/2015 Discharge date: 02/10/2015    Discharge Diagnoses:  Active Problems:  Chronic systolic congestive heart failure  Diabetes mellitus type II, non insulin dependent  S/P ICD (internal cardiac defibrillator) procedure  COPD (chronic obstructive pulmonary disease)  CKD (chronic kidney disease) stage 3, GFR 30-59 ml/min  PNA (pneumonia)  Dysphagia  Aspiration pneumonia   Discharge Condition: Stable  Diet recommendation: Heart healthy  Filed Weights   02/08/15 0455 02/09/15 0651 02/10/15 0555  Weight: 84.324 kg (185 lb 14.4 oz) 84.214 kg (185 lb 10.5 oz) 84.006 kg (185 lb 3.2 oz)    History of present illness:  Antonio Ray is a 81 y.o. male with PMH significant for Chronic Systolic HF EF 35 % ECHO 8088, COPD, CKD Cr last admission was at 1.7, last discharge from [facility] 01-15-2015 treated at that time for COPD exacerbation, PNA, Acute on chronic renal failure, who presents  complaining of SOB. Patient started to have worsening cough over last week, productive , green sputum. Also worsening SOB, worse since yesterday. He has been feeling dizziness, chills. He finished antibiotics from last admission. He has history of esophageal stricture, he plan to follow up with Antonio Ray for dilation. Patient denies food getting stock. Does relates some reflux, and back up of fluids.  Patient denies chest pain, abdominal pain, or diarrhea.  Patient has notice cough while eating.  Evaluation in the ED; Chest x ray with Air space disease right lung base. WBC at 11, Cr at 1.8, BNP 131, troponin negative, mild fevers/   Hospital Course:  Patient is a pleasant 81 year old gentleman with a past medical history of chronic systolic congestive heart failure having an ejection fraction of 35%, chronic kidney disease, established history of coronary artery disease, presented to the emergency department on 02/07/2015 with complaints of increasing cough associated with shortness of breath. Patient reporting intermittent spells of "choking up" on his food. He reports having difficulty swallowing over the past year. He had a barium swallow that was performed on 11/05/2013 that revealed distal esophageal narrowing with distal esophageal stricture. At the time he was managed conservatively, however, given the progression of symptoms he had been set up to undergo EGD in August. Workup included a two-view chest x-ray that revealed patchy airspace disease at the right lung base concerning for aspiration pneumonia. He was treated with IV  Zosyn, transition to oral Augmentin on 02/10/2015 given clinical stability. He had a modified barium study done on 02/10/2015 that showed significant esophageal dysmotility and findings suspicious for achalasia.    Suspected aspiration pneumonia -Patient with history of distal esophageal stricture based on barium swallow formed on 11/05/2013 which had been managed  conservatively.  -He presented with complaints of "choking up" on his food as initial chest x-ray revealed patchy airspace disease at the right lung base which could be consistent with aspiration pneumonia. -On 02/10/2015 he was transitioned to Augmentin -He remains nontoxic, afebrile, stable for discharge.  2. Dysphagia -Patient reporting dysphasia with liquids and solids. He had a barium swallow performed on 11/05/2013 that showed distal esophageal narrowing as well. He has been seeing Antonio. Deatra Ray of GI in the outpatient setting. -GI consulted  -He underwent a modified barium study on 02/10/2015 that showed significant esophageal dysmotility with diffuse tertiary contractions. There is narrowing at distal esophagus which could be suspicious for achalasia -Case was discussed with Antonio. Hilarie Ray who recommended proceeding with manometry which could be performed in the office. After this he would likely undergo EGD.  -GI did not recommend further inpatient workup  3. Chronic systolic congestive heart failure -Last transthoracic echocardiogram was performed on 01/14/2015 that showed an ejection fraction of 35-40%. Currently compensated -Continue Lasix 20 mg by mouth daily  4. Insulin dependent diabetes mellitus -Blood sugars fluctuated between 121 and 178 -Continue home regimen with Lantus 24 units subcutaneous daily and sliding scale coverage     Consultations:  GI      03/18/2015  Post hosp f/u ov/Wert re: ? Asp pna superimposed on  GOLD IV COPD/ maint on symbicort and spiriva  Chief Complaint  Patient presents with  . Follow-up    BQ patient. Pt states increased SOB with exertion, thinks its weather related. Pt also states non-productive cough Pt states not having to use rescue inhaler since last visit     Still having trouble swallowing >  GI w/u in progress per Antonio Ray  and only sob with exertion / dry cough day > noct with no frank asp noted and augmentin rx complete s relapse    No obvious day to day or daytime variability or assoc  cp or chest tightness, subjective wheeze or overt sinus or hb symptoms. No unusual exp hx or h/o childhood pna/ asthma or knowledge of premature birth.  Sleeping ok without nocturnal  or early am exacerbation  of respiratory  c/o's or need for noct saba. Also denies any obvious fluctuation of symptoms with weather or environmental changes or other aggravating or alleviating factors except as outlined above   Current Medications, Allergies, Complete Past Medical History, Past Surgical History, Family History, and Social History were reviewed in [employer] record.  ROS  The following are not active complaints unless bolded sore throat, dysphagia, dental problems, itching, sneezing,  nasal congestion or excess/ purulent secretions, ear ache,   fever, chills, sweats, unintended wt loss, classically pleuritic or exertional cp, hemoptysis,  orthopnea pnd or leg swelling, presyncope, palpitations, abdominal pain, anorexia, nausea, vomiting, diarrhea  or change in bowel or bladder habits, change in stools or urine, dysuria,hematuria,  rash, arthralgias, visual complaints, headache, numbness, weakness or ataxia or problems with walking or coordination,  change in mood/affect or memory.                Objective:   Physical Exam  Wt Readings from Last 3 Encounters:  03/18/15 185  lb (83.915 kg)  03/18/15 186 lb 3.2 oz (84.46 kg)  02/10/15 185 lb 3.2 oz (84.006 kg)    Vital signs reviewed  Gen: well appearing, no acute distress HEENT: NCAT, EOMi, OP clear PULM: Few   crackles in B lung base R > L , otherwise clear CV: RRR, no mgr, no JVD AB: BS+, soft, nontender, no hsm Ext: warm, no edema, no clubbing, no cyanosis Derm: no rash or skin breakdown    CXR PA and Lateral:   03/18/2015 :     I personally reviewed images and agree with radiology impression as follows:    Cardiac pacer with lead tips in right atrium  right ventricle. Heart size normal. Pulmonary vascularity is normal. Mild basilar subsegmental atelectasis and/or scarring. No pleural effusion or pneumothorax.    Dg es 02/10/15 reviewed with pt also  1. Significant esophageal dysmotility with diffuse tertiary contractions and a decreased primary stripping wave. There is narrowing of the distal esophagus with limited relaxation of the lower esophageal sphincter and non passage of a barium tablet. Findings are suspicious for achalasia. 2. No mucosal ulceration or recurrent aspiration identified . 3. Endoscopic evaluation recommended.    Assessment & Plan:

## 2015-03-18 NOTE — Patient Instructions (Signed)
Continue your current cardiac therapy  I will see you in 4 months

## 2015-03-18 NOTE — Progress Notes (Signed)
Antonio Ray Date of Birth: Mar 30, 1934 Medical Record #[id number]  History of Present Illness: Antonio Ray is seen for follow up CAD and CHF. He is seen with his son today. He has a history of coronary disease with remote myocardial infarction in 1994. He had emergent angioplasty of the LAD. Cardiac catheterization 2007 showed nonobstructive disease. He has an ischemic cardiomyopathy with ejection fraction of 30-35%. His last Myoview in 2011 showed an anterior wall scar without ischemia. He has an ICD in place.  Recently he was admitted in June and again in July with recurrent PNA. There is concern that this is related to aspiration.. He had a barium swallow that showed achalasia. Apparently manography confirmed this. He states food gets hung up in his esophagus and wont go down. he apparently developed acute memory loss this spring as well and is now on Aricept and Namenda. His son reports that his memory issues have improved and he thinks it is back to baseline. He still notes some increased SOB over baseline. He complains of orthostatic lightheadedness over the past 3-4 months. No increase in edema or orthopnea. No chest pain or palpitations.   Current Outpatient Prescriptions on File Prior to Visit  Medication Sig Dispense Refill  . aspirin 81 MG EC tablet Take 81 mg by mouth daily.      . budesonide-formoterol (SYMBICORT) 160-4.5 MCG/ACT inhaler Inhale 2 puffs into the lungs 2 (two) times daily. 1 Inhaler 12  . Cholecalciferol (VITAMIN D PO) Take 2,000 Units by mouth daily.     Marland Kitchen donepezil (ARICEPT) 10 MG tablet Take 10 mg by mouth every morning.    . fenofibrate (TRICOR) 145 MG tablet TAKE 1 TABLET BY MOUTH EVERY DAY 30 tablet 6  . furosemide (LASIX) 20 MG tablet TAKE 1 TABLET BY MOUTH EVERY DAY 90 tablet 2  . insulin glargine (LANTUS) 100 UNIT/ML injection Inject 24 Units into the skin every morning.     . memantine (NAMENDA) 10 MG tablet Take 1 tablet (10 mg total) by mouth 2 (two)  times daily. 60 tablet 11  . Multiple Vitamins-Minerals (PRESERVISION AREDS 2) CAPS Take 1 capsule by mouth 2 (two) times daily.    . pantoprazole (PROTONIX) 20 MG tablet TAKE 1 TABLET BY MOUTH EVERY DAY 90 tablet 0  . pravastatin (PRAVACHOL) 40 MG tablet TAKE 1 TABLET BY MOUTH EVERY EVENING 90 tablet 0  . PROVENTIL HFA 108 (90 BASE) MCG/ACT inhaler Inhale 2 puffs into the lungs every 6 (six) hours as needed for wheezing or shortness of breath. As directed    . tiotropium (SPIRIVA) 18 MCG inhalation capsule Place 1 capsule (18 mcg total) into inhaler and inhale daily. 30 capsule 1   No current facility-administered medications on file prior to visit.    Allergies  Allergen Reactions  . Codeine Other (See Comments)    Gets very angry, disoriented  . Dilaudid [Hydromorphone Hcl] Other (See Comments)    VERY AGITATED, HOSTILE  . Flomax [Tamsulosin Hcl] Shortness Of Breath  . Morphine And Related Other (See Comments)    VERY AGITATED, HOSTILE  . Sulfa Antibiotics Shortness Of Breath  . Carvedilol Other (See Comments)    DISORIENTATION  . Levofloxacin Other (See Comments)    unknown    Past Medical History  Diagnosis Date  . GERD (gastroesophageal reflux disease)   . Coronary artery disease     History of remote anterior MI with PCI to LAD in 2006; most recent cath 2007, no intervention  required  . Chronic systolic dysfunction of left ventricle     EF 30-35%, CLASS II - III SYMPTOMS; intolerant to Coreg  . Hyperlipidemia   . PVC's (premature ventricular contractions)   . DOE (dyspnea on exertion)   . Intolerance, drug     Intolerant to beta blockers  . BPH (benign prostatic hyperplasia)   . CHF (congestive heart failure)   . COPD (chronic obstructive pulmonary disease)   . AICD (automatic cardioverter/defibrillator) present 03/30/2011    Analyze ST study patient  . Heart murmur     hx  . Myocardial infarction 1994    ANTERIOR  . Pneumonia "several times"  . Type II diabetes  mellitus   . CRF (chronic renal failure)   . CKD (chronic kidney disease) stage 3, GFR 30-59 ml/min 06/08/2012  . Skin cancer "several"    "forearms; head"  . Memory loss   . Anginal pain   . Dysrhythmia     Past Surgical History  Procedure Laterality Date  . Foot fracture surgery Right 1980's  . Cholecystectomy  2/11  . Cataract extraction w/ intraocular lens  implant, bilateral  08/2014-09/2014  . Cardiac catheterization  01/11/2006    DEMONSTRATES AKINESIA OF THE DISTAL ANTERIOR WALL, DISTAL INFERIOR WALL AND AKINESIA OF THE APEX. THE BASAL SEGMENTS CONTRACT WELL AND OVERALL EF 35%  . Coronary angioplasty  1994    TO THE LAD  . Skin cancer excision  "several"    "forearms, head"  . Insert / replace / remove pacemaker    . Esophageal manometry N/A 03/16/2015    Procedure: ESOPHAGEAL MANOMETRY (EM);  Surgeon: Jerene Bears, MD;  Location: [facility];  Service: Gastroenterology;  Laterality: N/A;    History  Smoking status  . Former Smoker -- 1.00 packs/day for 35 years  . Types: Cigarettes  . Quit date: 08/01/1992  Smokeless tobacco  . Never Used    History  Alcohol Use  . 0.0 oz/week  . 0 Standard drinks or equivalent per week    Comment: Approximately a beer twice yearly    Family History  Problem Relation Age of Onset  . Stroke Father     Mother history unknown - never met her    Review of Systems: As noted in history of present illness. All other systems were reviewed and are negative.  Physical Exam: BP 106/52 mmHg  Pulse 68  Ht 5' 10"  (1.778 m)  Wt 83.915 kg (185 lb)  BMI 26.54 kg/m2 Patient is very pleasant and in no acute distress. Skin is warm and dry. Color is normal.  HEENT is unremarkable. Normocephalic/atraumatic. PERRL. Sclera are nonicteric. Neck is supple. No masses. No JVD. Lungs are clear. Cardiac exam shows a regular rate and rhythm. Abdomen is soft and obese. No masses or bruits . Extremities are without edema.  pedal pulses are good. Gait and  ROM are intact. No gross neurologic deficits noted.   LABORATORY DATA: Lab Results  Component Value Date   WBC 6.6 02/10/2015   HGB 11.5* 02/10/2015   HCT 36.9* 02/10/2015   PLT 214 02/10/2015   GLUCOSE 140* 02/10/2015   CHOL 136 06/20/2013   TRIG 142.0 06/20/2013   HDL 32.00* 06/20/2013   LDLCALC 76 06/20/2013   ALT 24 01/14/2015   AST 29 01/14/2015   NA 141 02/10/2015   K 4.1 02/10/2015   CL 104 02/10/2015   CREATININE 1.78* 02/10/2015   BUN 26* 02/10/2015   CO2 30 02/10/2015   INR 1.39  01/14/2015   HGBA1C 7.6* 01/14/2015  BNP 02/07/15- 131.  Echo: 01/14/15: Study Conclusions  - Left ventricle: The cavity size was normal. Wall thickness was increased in a pattern of mild LVH. Systolic function was moderately reduced. The estimated ejection fraction was in the range of 35% to 40%. There is thinning an increased echogenicity of the anterior, anteroseptal and apical walls consistent with LAD territory infarct. The LV apex buldges slightly consistent with aneurysm. Doppler parameters are consistent with abnormal left ventricular relaxation (grade 1 diastolic dysfunction). The E/e&' ratio is >15, suggesting elevated LV filling pressure. - Aortic valve: Trileaflet. Sclerosis without stenosis. - Mitral valve: Mildly thickened leaflets . There was trivial regurgitation. - Left atrium: The atrium was mildly dilated at 36 ml/m2. - Right ventricle: The cavity size was normal. Wall thickness was normal. Pacer wire or catheter noted in right ventricle. Systolic function was normal. - Inferior vena cava: The vessel was dilated. The respirophasic diameter changes were blunted (< 50%), consistent with elevated central venous pressure.  Impressions:  - LVEF 35-40%, anterior, anteroseptal and apical scar wtih akinesis - there is bulding of the apex consistent with aneurysm., diastolic dysfunction with elevated LV filling pressure, mild LAE, dilated  IVC, pacer wires noted in the right heart.   Assessment / Plan: 1. Coronary disease with remote anterior myocardial infarction in 1994. Cardiac catheterization 2007 showed nonobstructive disease. His last stress test in October 2011 showed no ischemia. We will continue with his current medical therapy including aspirin. He has a history of intolerance to carvedilol and nitrates because of dizziness.  2. Ischemic cardiomyopathy with chronic systolic CHF. He is asymptomatic. He appears to be euvolemic. We'll continue with his current diuretic dose and ARB. Weight is stable. His son questions continuing diuretics given his orthostatic lightheadedness but I think we need to continue. He is on low dose losartan and lasix only and BP is OK. His symptoms really started when he was placed on Aricept and this may be the more likely cause. Will observe for now.   3. Prophylactic ICD.  4. Hyperlipidemia.   5. Chronic kidney disease stage III.   6. Recurrent PNA- possibly related to aspiration. Followed by pulmonary  7. Achalasia. Per GI.

## 2015-03-19 DIAGNOSIS — Z85828 Personal history of other malignant neoplasm of skin: Secondary | ICD-10-CM | POA: Diagnosis not present

## 2015-03-19 DIAGNOSIS — D225 Melanocytic nevi of trunk: Secondary | ICD-10-CM | POA: Diagnosis not present

## 2015-03-19 DIAGNOSIS — Q828 Other specified congenital malformations of skin: Secondary | ICD-10-CM | POA: Diagnosis not present

## 2015-03-19 DIAGNOSIS — L821 Other seborrheic keratosis: Secondary | ICD-10-CM | POA: Diagnosis not present

## 2015-03-19 DIAGNOSIS — L570 Actinic keratosis: Secondary | ICD-10-CM | POA: Diagnosis not present

## 2015-03-21 ENCOUNTER — Encounter: Payer: Self-pay | Admitting: Internal Medicine

## 2015-03-21 ENCOUNTER — Inpatient Hospital Stay (HOSPITAL_COMMUNITY)
Admission: EM | Admit: 2015-03-21 | Discharge: 2015-03-24 | DRG: 178 | Disposition: A | Discharge status: Home / Self Care | Payer: Medicare Other | Attending: Internal Medicine | Admitting: Internal Medicine

## 2015-03-21 ENCOUNTER — Encounter (HOSPITAL_COMMUNITY): Payer: Self-pay | Admitting: Emergency Medicine

## 2015-03-21 ENCOUNTER — Emergency Department (HOSPITAL_COMMUNITY): Payer: Medicare Other

## 2015-03-21 DIAGNOSIS — N183 Chronic kidney disease, stage 3 unspecified: Secondary | ICD-10-CM | POA: Diagnosis present

## 2015-03-21 DIAGNOSIS — Z794 Long term (current) use of insulin: Secondary | ICD-10-CM

## 2015-03-21 DIAGNOSIS — Z95810 Presence of automatic (implantable) cardiac defibrillator: Secondary | ICD-10-CM | POA: Diagnosis present

## 2015-03-21 DIAGNOSIS — Z7982 Long term (current) use of aspirin: Secondary | ICD-10-CM | POA: Diagnosis not present

## 2015-03-21 DIAGNOSIS — Z87891 Personal history of nicotine dependence: Secondary | ICD-10-CM | POA: Diagnosis not present

## 2015-03-21 DIAGNOSIS — I5022 Chronic systolic (congestive) heart failure: Secondary | ICD-10-CM

## 2015-03-21 DIAGNOSIS — Z882 Allergy status to sulfonamides status: Secondary | ICD-10-CM

## 2015-03-21 DIAGNOSIS — Z85828 Personal history of other malignant neoplasm of skin: Secondary | ICD-10-CM

## 2015-03-21 DIAGNOSIS — E785 Hyperlipidemia, unspecified: Secondary | ICD-10-CM | POA: Diagnosis present

## 2015-03-21 DIAGNOSIS — I5043 Acute on chronic combined systolic (congestive) and diastolic (congestive) heart failure: Secondary | ICD-10-CM | POA: Diagnosis present

## 2015-03-21 DIAGNOSIS — F0390 Unspecified dementia without behavioral disturbance: Secondary | ICD-10-CM | POA: Diagnosis present

## 2015-03-21 DIAGNOSIS — D649 Anemia, unspecified: Secondary | ICD-10-CM | POA: Diagnosis present

## 2015-03-21 DIAGNOSIS — K219 Gastro-esophageal reflux disease without esophagitis: Secondary | ICD-10-CM | POA: Diagnosis present

## 2015-03-21 DIAGNOSIS — Z79899 Other long term (current) drug therapy: Secondary | ICD-10-CM

## 2015-03-21 DIAGNOSIS — E1122 Type 2 diabetes mellitus with diabetic chronic kidney disease: Secondary | ICD-10-CM | POA: Diagnosis present

## 2015-03-21 DIAGNOSIS — I255 Ischemic cardiomyopathy: Secondary | ICD-10-CM | POA: Diagnosis present

## 2015-03-21 DIAGNOSIS — I129 Hypertensive chronic kidney disease with stage 1 through stage 4 chronic kidney disease, or unspecified chronic kidney disease: Secondary | ICD-10-CM | POA: Diagnosis present

## 2015-03-21 DIAGNOSIS — N184 Chronic kidney disease, stage 4 (severe): Secondary | ICD-10-CM | POA: Diagnosis present

## 2015-03-21 DIAGNOSIS — J189 Pneumonia, unspecified organism: Secondary | ICD-10-CM | POA: Insufficient documentation

## 2015-03-21 DIAGNOSIS — R1310 Dysphagia, unspecified: Secondary | ICD-10-CM | POA: Diagnosis present

## 2015-03-21 DIAGNOSIS — Z9861 Coronary angioplasty status: Secondary | ICD-10-CM | POA: Diagnosis not present

## 2015-03-21 DIAGNOSIS — J690 Pneumonitis due to inhalation of food and vomit: Principal | ICD-10-CM | POA: Diagnosis present

## 2015-03-21 DIAGNOSIS — R918 Other nonspecific abnormal finding of lung field: Secondary | ICD-10-CM | POA: Diagnosis not present

## 2015-03-21 DIAGNOSIS — Z888 Allergy status to other drugs, medicaments and biological substances status: Secondary | ICD-10-CM

## 2015-03-21 DIAGNOSIS — I252 Old myocardial infarction: Secondary | ICD-10-CM | POA: Diagnosis not present

## 2015-03-21 DIAGNOSIS — T17908A Unspecified foreign body in respiratory tract, part unspecified causing other injury, initial encounter: Secondary | ICD-10-CM

## 2015-03-21 DIAGNOSIS — Z881 Allergy status to other antibiotic agents status: Secondary | ICD-10-CM

## 2015-03-21 DIAGNOSIS — I2510 Atherosclerotic heart disease of native coronary artery without angina pectoris: Secondary | ICD-10-CM | POA: Diagnosis present

## 2015-03-21 DIAGNOSIS — Z9049 Acquired absence of other specified parts of digestive tract: Secondary | ICD-10-CM | POA: Diagnosis present

## 2015-03-21 DIAGNOSIS — N400 Enlarged prostate without lower urinary tract symptoms: Secondary | ICD-10-CM | POA: Diagnosis present

## 2015-03-21 DIAGNOSIS — R0602 Shortness of breath: Secondary | ICD-10-CM | POA: Diagnosis not present

## 2015-03-21 DIAGNOSIS — Z885 Allergy status to narcotic agent status: Secondary | ICD-10-CM

## 2015-03-21 DIAGNOSIS — T17998A Other foreign object in respiratory tract, part unspecified causing other injury, initial encounter: Secondary | ICD-10-CM | POA: Diagnosis not present

## 2015-03-21 DIAGNOSIS — J441 Chronic obstructive pulmonary disease with (acute) exacerbation: Secondary | ICD-10-CM | POA: Insufficient documentation

## 2015-03-21 DIAGNOSIS — K220 Achalasia of cardia: Secondary | ICD-10-CM | POA: Diagnosis present

## 2015-03-21 LAB — CBC WITH DIFFERENTIAL/PLATELET
BASOS ABS: 0 10*3/uL (ref 0.0–0.1)
Basophils Relative: 0 % (ref 0–1)
EOS ABS: 0.1 10*3/uL (ref 0.0–0.7)
Eosinophils Relative: 0 % (ref 0–5)
HCT: 40.4 % (ref 39.0–52.0)
Hemoglobin: 12.7 g/dL — ABNORMAL LOW (ref 13.0–17.0)
Lymphocytes Relative: 11 % — ABNORMAL LOW (ref 12–46)
Lymphs Abs: 2.1 10*3/uL (ref 0.7–4.0)
MCH: 27.6 pg (ref 26.0–34.0)
MCHC: 31.4 g/dL (ref 30.0–36.0)
MCV: 87.8 fL (ref 78.0–100.0)
MONO ABS: 0.9 10*3/uL (ref 0.1–1.0)
Monocytes Relative: 5 % (ref 3–12)
Neutro Abs: 15.1 10*3/uL — ABNORMAL HIGH (ref 1.7–7.7)
Neutrophils Relative %: 84 % — ABNORMAL HIGH (ref 43–77)
Platelets: 298 10*3/uL (ref 150–400)
RBC: 4.6 MIL/uL (ref 4.22–5.81)
RDW: 14.2 % (ref 11.5–15.5)
WBC: 18.1 10*3/uL — AB (ref 4.0–10.5)

## 2015-03-21 LAB — COMPREHENSIVE METABOLIC PANEL
ALT: 24 U/L (ref 17–63)
AST: 33 U/L (ref 15–41)
Albumin: 3.1 g/dL — ABNORMAL LOW (ref 3.5–5.0)
Alkaline Phosphatase: 46 U/L (ref 38–126)
Anion gap: 11 (ref 5–15)
BUN: 33 mg/dL — AB (ref 6–20)
CO2: 26 mmol/L (ref 22–32)
CREATININE: 1.84 mg/dL — AB (ref 0.61–1.24)
Calcium: 9.2 mg/dL (ref 8.9–10.3)
Chloride: 103 mmol/L (ref 101–111)
GFR, EST AFRICAN AMERICAN: 38 mL/min — AB (ref >60)
GFR, EST NON AFRICAN AMERICAN: 33 mL/min — AB (ref >60)
Glucose, Bld: 190 mg/dL — ABNORMAL HIGH (ref 65–99)
POTASSIUM: 3.7 mmol/L (ref 3.5–5.1)
SODIUM: 140 mmol/L (ref 135–145)
Total Bilirubin: 0.4 mg/dL (ref 0.3–1.2)
Total Protein: 6.8 g/dL (ref 6.5–8.1)

## 2015-03-21 LAB — I-STAT TROPONIN, ED: Troponin i, poc: 0.01 ng/mL (ref 0.00–0.08)

## 2015-03-21 LAB — I-STAT CG4 LACTIC ACID, ED: Lactic Acid, Venous: 2.43 mmol/L (ref 0.5–2.0)

## 2015-03-21 LAB — BRAIN NATRIURETIC PEPTIDE: B Natriuretic Peptide: 100.9 pg/mL — ABNORMAL HIGH (ref 0.0–100.0)

## 2015-03-21 MED ORDER — ALUM & MAG HYDROXIDE-SIMETH 200-200-20 MG/5ML PO SUSP: 30.0000 mL | Freq: Four times a day (QID) | ORAL | Status: DC | PRN

## 2015-03-21 MED ORDER — ONDANSETRON HCL 4 MG PO TABS: 4.0000 mg | ORAL_TABLET | Freq: Four times a day (QID) | ORAL | Status: DC | PRN

## 2015-03-21 MED ORDER — VANCOMYCIN HCL 10 G IV SOLR
1250.0000 mg | INTRAVENOUS | Status: DC
  Filled 2015-03-21: qty 1250

## 2015-03-21 MED ORDER — PIPERACILLIN-TAZOBACTAM 3.375 G IVPB
3.3750 g | Freq: Three times a day (TID) | INTRAVENOUS | Status: DC
  Administered 2015-03-21: 3.375 g via INTRAVENOUS
  Filled 2015-03-21 (×2): qty 50

## 2015-03-21 MED ORDER — OCUVITE-LUTEIN PO CAPS
1.0000 | ORAL_CAPSULE | Freq: Two times a day (BID) | ORAL | Status: DC
  Administered 2015-03-22 – 2015-03-24 (×5): 1 via ORAL
  Filled 2015-03-21 (×8): qty 1

## 2015-03-21 MED ORDER — MEMANTINE HCL 10 MG PO TABS
10.0000 mg | ORAL_TABLET | Freq: Two times a day (BID) | ORAL | Status: DC
  Administered 2015-03-21 – 2015-03-24 (×6): 10 mg via ORAL
  Filled 2015-03-21 (×8): qty 1

## 2015-03-21 MED ORDER — ACETAMINOPHEN 325 MG PO TABS: 650.0000 mg | ORAL_TABLET | Freq: Four times a day (QID) | ORAL | Status: DC | PRN

## 2015-03-21 MED ORDER — ACETAMINOPHEN 650 MG RE SUPP: 650.0000 mg | Freq: Four times a day (QID) | RECTAL | Status: DC | PRN

## 2015-03-21 MED ORDER — METHYLPREDNISOLONE SODIUM SUCC 125 MG IJ SOLR
125.0000 mg | Freq: Once | INTRAMUSCULAR | Status: AC
  Administered 2015-03-21: 125 mg via INTRAVENOUS
  Filled 2015-03-21: qty 2

## 2015-03-21 MED ORDER — SODIUM CHLORIDE 0.9 % IV BOLUS (SEPSIS): 500.0000 mL | Freq: Once | INTRAVENOUS | Status: DC

## 2015-03-21 MED ORDER — SODIUM CHLORIDE 0.9 % IV BOLUS (SEPSIS)
1000.0000 mL | Freq: Once | INTRAVENOUS | Status: AC
  Administered 2015-03-21: 1000 mL via INTRAVENOUS

## 2015-03-21 MED ORDER — BUDESONIDE-FORMOTEROL FUMARATE 160-4.5 MCG/ACT IN AERO
2.0000 | INHALATION_SPRAY | Freq: Two times a day (BID) | RESPIRATORY_TRACT | Status: DC
  Administered 2015-03-21 – 2015-03-24 (×5): 2 via RESPIRATORY_TRACT
  Filled 2015-03-21: qty 6

## 2015-03-21 MED ORDER — SODIUM CHLORIDE 0.9 % IJ SOLN
3.0000 mL | Freq: Two times a day (BID) | INTRAMUSCULAR | Status: DC
  Administered 2015-03-22 – 2015-03-23 (×2): 3 mL via INTRAVENOUS

## 2015-03-21 MED ORDER — SODIUM CHLORIDE 0.9 % IJ SOLN: 3.0000 mL | INTRAMUSCULAR | Status: DC | PRN

## 2015-03-21 MED ORDER — PIPERACILLIN-TAZOBACTAM 3.375 G IVPB 30 MIN
3.3750 g | Freq: Three times a day (TID) | INTRAVENOUS | Status: DC
  Administered 2015-03-21 – 2015-03-24 (×8): 3.375 g via INTRAVENOUS
  Filled 2015-03-21 (×10): qty 50

## 2015-03-21 MED ORDER — ALBUTEROL (5 MG/ML) CONTINUOUS INHALATION SOLN
15.0000 mg/h | INHALATION_SOLUTION | Freq: Once | RESPIRATORY_TRACT | Status: AC
  Administered 2015-03-21: 15 mg/h via RESPIRATORY_TRACT
  Filled 2015-03-21: qty 20

## 2015-03-21 MED ORDER — ENOXAPARIN SODIUM 40 MG/0.4ML ~~LOC~~ SOLN
40.0000 mg | SUBCUTANEOUS | Status: DC
  Administered 2015-03-21 – 2015-03-24 (×4): 40 mg via SUBCUTANEOUS
  Filled 2015-03-21 (×3): qty 0.4

## 2015-03-21 MED ORDER — FENOFIBRATE 54 MG PO TABS
54.0000 mg | ORAL_TABLET | Freq: Every day | ORAL | Status: DC
  Administered 2015-03-21 – 2015-03-24 (×4): 54 mg via ORAL
  Filled 2015-03-21 (×4): qty 1

## 2015-03-21 MED ORDER — ONDANSETRON HCL 4 MG/2ML IJ SOLN: 4.0000 mg | Freq: Four times a day (QID) | INTRAMUSCULAR | Status: DC | PRN

## 2015-03-21 MED ORDER — SODIUM CHLORIDE 0.9 % IV SOLN: 250.0000 mL | INTRAVENOUS | Status: DC | PRN

## 2015-03-21 MED ORDER — FUROSEMIDE 20 MG PO TABS
20.0000 mg | ORAL_TABLET | Freq: Every day | ORAL | Status: DC
  Administered 2015-03-22 – 2015-03-24 (×3): 20 mg via ORAL
  Filled 2015-03-21 (×4): qty 1

## 2015-03-21 MED ORDER — ALBUTEROL SULFATE (2.5 MG/3ML) 0.083% IN NEBU: 3.0000 mL | INHALATION_SOLUTION | Freq: Four times a day (QID) | RESPIRATORY_TRACT | Status: DC | PRN

## 2015-03-21 MED ORDER — INSULIN GLARGINE 100 UNIT/ML ~~LOC~~ SOLN
24.0000 [IU] | Freq: Every morning | SUBCUTANEOUS | Status: DC
  Administered 2015-03-22 – 2015-03-24 (×3): 24 [IU] via SUBCUTANEOUS
  Filled 2015-03-21 (×3): qty 0.24

## 2015-03-21 MED ORDER — DONEPEZIL HCL 10 MG PO TABS
10.0000 mg | ORAL_TABLET | Freq: Every morning | ORAL | Status: DC
  Administered 2015-03-22 – 2015-03-24 (×3): 10 mg via ORAL
  Filled 2015-03-21 (×4): qty 1

## 2015-03-21 MED ORDER — TIOTROPIUM BROMIDE MONOHYDRATE 18 MCG IN CAPS
18.0000 ug | ORAL_CAPSULE | Freq: Every day | RESPIRATORY_TRACT | Status: DC
  Administered 2015-03-22 – 2015-03-24 (×3): 18 ug via RESPIRATORY_TRACT
  Filled 2015-03-21: qty 5

## 2015-03-21 MED ORDER — IPRATROPIUM BROMIDE 0.02 % IN SOLN
0.5000 mg | Freq: Once | RESPIRATORY_TRACT | Status: AC
  Administered 2015-03-21: 0.5 mg via RESPIRATORY_TRACT
  Filled 2015-03-21: qty 2.5

## 2015-03-21 MED ORDER — PRAVASTATIN SODIUM 40 MG PO TABS
40.0000 mg | ORAL_TABLET | Freq: Every evening | ORAL | Status: DC
  Administered 2015-03-21 – 2015-03-23 (×3): 40 mg via ORAL
  Filled 2015-03-21 (×4): qty 1

## 2015-03-21 MED ORDER — SODIUM CHLORIDE 0.9 % IJ SOLN
3.0000 mL | Freq: Two times a day (BID) | INTRAMUSCULAR | Status: DC
  Administered 2015-03-21: 3 mL via INTRAVENOUS

## 2015-03-21 MED ORDER — ASPIRIN 81 MG PO TBEC
81.0000 mg | DELAYED_RELEASE_TABLET | Freq: Every day | ORAL | Status: DC
  Administered 2015-03-22 – 2015-03-24 (×3): 81 mg via ORAL
  Filled 2015-03-21 (×4): qty 1

## 2015-03-21 MED ORDER — PANTOPRAZOLE SODIUM 20 MG PO TBEC
20.0000 mg | DELAYED_RELEASE_TABLET | Freq: Every day | ORAL | Status: DC
  Administered 2015-03-22 – 2015-03-24 (×3): 20 mg via ORAL
  Filled 2015-03-21 (×4): qty 1

## 2015-03-21 NOTE — Assessment & Plan Note (Signed)
cxr reviewed, no evidence of a sign residual acute pna though certainly risk of recurrence if dysphagia not improved

## 2015-03-21 NOTE — Assessment & Plan Note (Signed)
10/07/2013 simple spirometry> FEV1 0.95 L (29% predicted) ratio 53%  Actually fairly well compensated on present rx and no need for change though concerned about recurrent asp pna > GI w/u in progress  I had an extended discussion with the patient reviewing all relevant studies completed to date and  lasting 15 to 20 minutes of a 25 minute visit    Each maintenance medication was reviewed in detail including most importantly the difference between maintenance and prns and under what circumstances the prns are to be triggered using an action plan format that is not reflected in the computer generated alphabetically organized AVS.    Please see instructions for details which were reviewed in writing and the patient given a copy highlighting the part that I personally wrote and discussed at today's ov.

## 2015-03-21 NOTE — ED Provider Notes (Signed)
CSN: [id number]     Arrival date & time 03/21/15  1009 History   First MD Initiated Contact with Patient 03/21/15 1016     Chief Complaint  Patient presents with  . Shortness of Breath     (Consider location/radiation/quality/duration/timing/severity/associated sxs/prior Treatment) The history is provided by the patient.  Eberardo KIEON LAWHORN is a 81 y.o. male history of reflux, CAD, CHF with EF 35% and AICD, COPD, DM, recent pneumonia here with shortness of breath, cough. Patient was admitted 2 months ago for pneumonia. He has been having chronic cough worse in the mornings since the admission. This morning however he has worsening cough and has worsening shortness of breath. Denies any fevers or chills. He was unable to eat his food because of the shortness of breath. Called EMS and was noted to be hypoxic 85% on room air. He is not on oxygen at home. Denies any worsening legs swelling. Denies any chest pain.    Past Medical History  Diagnosis Date  . GERD (gastroesophageal reflux disease)   . Coronary artery disease     History of remote anterior MI with PCI to LAD in 2006; most recent cath 2007, no intervention required  . Chronic systolic dysfunction of left ventricle     EF 30-35%, CLASS II - III SYMPTOMS; intolerant to Coreg  . Hyperlipidemia   . PVC's (premature ventricular contractions)   . DOE (dyspnea on exertion)   . Intolerance, drug     Intolerant to beta blockers  . BPH (benign prostatic hyperplasia)   . CHF (congestive heart failure)   . COPD (chronic obstructive pulmonary disease)   . AICD (automatic cardioverter/defibrillator) present 03/30/2011    Analyze ST study patient  . Heart murmur     hx  . Myocardial infarction 1994    ANTERIOR  . Pneumonia "several times"  . Type II diabetes mellitus   . CRF (chronic renal failure)   . CKD (chronic kidney disease) stage 3, GFR 30-59 ml/min 06/08/2012  . Skin cancer "several"    "forearms; head"  . Memory loss   . Anginal  pain   . Dysrhythmia    Past Surgical History  Procedure Laterality Date  . Foot fracture surgery Right 1980's  . Cholecystectomy  2/11  . Cataract extraction w/ intraocular lens  implant, bilateral  08/2014-09/2014  . Cardiac catheterization  01/11/2006    DEMONSTRATES AKINESIA OF THE DISTAL ANTERIOR WALL, DISTAL INFERIOR WALL AND AKINESIA OF THE APEX. THE BASAL SEGMENTS CONTRACT WELL AND OVERALL EF 35%  . Coronary angioplasty  1994    TO THE LAD  . Skin cancer excision  "several"    "forearms, head"  . Insert / replace / remove pacemaker    . Esophageal manometry N/A 03/16/2015    Procedure: ESOPHAGEAL MANOMETRY (EM);  Surgeon: Jerene Bears, MD;  Location: [facility];  Service: Gastroenterology;  Laterality: N/A;   Family History  Problem Relation Age of Onset  . Stroke Father     Mother history unknown - never met her   Social History  Substance Use Topics  . Smoking status: Former Smoker -- 1.00 packs/day for 35 years    Types: Cigarettes    Quit date: 08/01/1992  . Smokeless tobacco: Never Used  . Alcohol Use: 0.0 oz/week    0 Standard drinks or equivalent per week     Comment: Approximately a beer twice yearly    Review of Systems  Respiratory: Positive for cough and shortness of breath.  All other systems reviewed and are negative.     Allergies  Codeine; Dilaudid; Flomax; Morphine and related; Sulfa antibiotics; Carvedilol; and Levofloxacin  Home Medications   Prior to Admission medications   Medication Sig Start Date End Date Taking? Authorizing Provider  aspirin 81 MG EC tablet Take 81 mg by mouth daily.     Yes Historical Provider, MD  budesonide-formoterol (SYMBICORT) 160-4.5 MCG/ACT inhaler Inhale 2 puffs into the lungs 2 (two) times daily. 09/19/14  Yes Juanito Doom, MD  Cholecalciferol (VITAMIN D PO) Take 2,000 Units by mouth daily.    Yes Historical Provider, MD  donepezil (ARICEPT) 10 MG tablet Take 10 mg by mouth every morning.   Yes Historical  Provider, MD  fenofibrate (TRICOR) 145 MG tablet TAKE 1 TABLET BY MOUTH EVERY DAY 01/22/15  Yes Peter M Martinique, MD  furosemide (LASIX) 20 MG tablet TAKE 1 TABLET BY MOUTH EVERY DAY 09/19/14  Yes Peter M Martinique, MD  insulin glargine (LANTUS) 100 UNIT/ML injection Inject 24 Units into the skin every morning.    Yes Historical Provider, MD  losartan (COZAAR) 25 MG tablet Take 1 tablet (25 mg total) by mouth daily. 03/18/15  Yes Peter M Martinique, MD  memantine (NAMENDA) 10 MG tablet Take 1 tablet (10 mg total) by mouth 2 (two) times daily. 01/19/15  Yes Marcial Pacas, MD  Multiple Vitamins-Minerals (PRESERVISION AREDS 2) CAPS Take 1 capsule by mouth 2 (two) times daily.   Yes Historical Provider, MD  pantoprazole (PROTONIX) 20 MG tablet TAKE 1 TABLET BY MOUTH EVERY DAY 07/18/14  Yes Inda Castle, MD  pravastatin (PRAVACHOL) 40 MG tablet TAKE 1 TABLET BY MOUTH EVERY EVENING 02/20/15  Yes Peter M Martinique, MD  PROVENTIL HFA 108 (90 BASE) MCG/ACT inhaler Inhale 2 puffs into the lungs every 6 (six) hours as needed for wheezing or shortness of breath. As directed 05/24/13  Yes Historical Provider, MD  tiotropium (SPIRIVA) 18 MCG inhalation capsule Place 1 capsule (18 mcg total) into inhaler and inhale daily. 03/12/15  Yes Juanito Doom, MD   BP 93/54 mmHg  Pulse 98  Temp(Src) 98.7 F (37.1 C) (Oral)  Resp 31  Ht _0  (1.778 m)  Wt 187 lb (84.823 kg)  BMI 26.83 kg/m2  SpO2 100% Physical Exam  Constitutional: He is oriented to person, place, and time. He appears well-developed.  Chronically ill, slightly tachypneic   HENT:  Head: Normocephalic.  Mouth/Throat: Oropharynx is clear and moist.  Eyes: Conjunctivae are normal. Pupils are equal, round, and reactive to light.  Neck: Normal range of motion. Neck supple.  Cardiovascular: Regular rhythm and normal heart sounds.   Slightly tachy   Pulmonary/Chest:  Tachypneic, + mild diffuse wheezing, mod air movements. Crackles R base   Abdominal: Soft. Bowel  sounds are normal. He exhibits no distension. There is no tenderness. There is no rebound.  Musculoskeletal: Normal range of motion. He exhibits no edema or tenderness.  Neurological: He is alert and oriented to person, place, and time. No cranial nerve deficit. Coordination normal.  Skin: Skin is warm and dry.  Psychiatric: He has a normal mood and affect. His behavior is normal. Judgment and thought content normal.  Nursing note and vitals reviewed.   ED Course  Procedures (including critical care time) Labs Review Labs Reviewed  CBC WITH DIFFERENTIAL/PLATELET - Abnormal; Notable for the following:    WBC 18.1 (*)    Hemoglobin 12.7 (*)    Neutrophils Relative % 84 (*)  Neutro Abs 15.1 (*)    Lymphocytes Relative 11 (*)    All other components within normal limits  COMPREHENSIVE METABOLIC PANEL - Abnormal; Notable for the following:    Glucose, Bld 190 (*)    BUN 33 (*)    Creatinine, Ser 1.84 (*)    Albumin 3.1 (*)    GFR calc non Af Amer 33 (*)    GFR calc Af Amer 38 (*)    All other components within normal limits  BRAIN NATRIURETIC PEPTIDE - Abnormal; Notable for the following:    B Natriuretic Peptide 100.9 (*)    All other components within normal limits  I-STAT CG4 LACTIC ACID, ED - Abnormal; Notable for the following:    Lactic Acid, Venous 2.43 (*)    All other components within normal limits  CULTURE, BLOOD (ROUTINE X 2)  CULTURE, BLOOD (ROUTINE X 2)  I-STAT TROPOININ, ED    Imaging Review Dg Chest Port 1 View  03/21/2015   CLINICAL DATA:  Short of breath.  Initial encounter.  EXAM: PORTABLE CHEST - 1 VIEW  COMPARISON:  03/18/2015.  09/10/2013.  FINDINGS: Cardiopericardial silhouette within normal limits. Mediastinal contours normal. Trachea midline. No airspace disease or effusion. Two lead LEFT subclavian cardiac pacemaker. Monitoring leads project over the chest.  IMPRESSION: No active cardiopulmonary disease.   Electronically Signed   By: Dereck Ligas M.D.    On: 03/21/2015 11:41   I have personally reviewed and evaluated these images and lab results as part of my medical decision-making.   EKG Interpretation None      MDM   Final diagnoses:  None    Layton JAVAR ESHBACH is a 81 y.o. male here with shortness of breath. Likely COPD exacerbation, can have component of CHF and possible pneumonia as well. Will give nebs, steroids. Will do sepsis workup. However, given EF 35% and known CHF, will not give the 30 cc/kg bolus as it will likely get his CHF worse.   12:22 PM WBC 18. Lactate mildly elevated. CXR clear. Still think has clinical pneumonia. Given HCAP coverage. BNP only 100 currently. Will admit for COPD, HCAP.   Wandra Arthurs, MD 03/21/15 [phone]

## 2015-03-21 NOTE — ED Notes (Signed)
Called EMS for SOB. On EMS arrival, 85% on room air. Given 5mg  Albuterol tx in route; breathing improved and sats up to 95%. Reports battling with pneumonia since June.

## 2015-03-21 NOTE — Progress Notes (Signed)
ANTIBIOTIC CONSULT NOTE - INITIAL  Pharmacy Consult for Vancomycin and Zosyn Indication: pneumonia  Allergies  Allergen Reactions  . Codeine Other (See Comments)    Gets very angry, disoriented  . Dilaudid [Hydromorphone Hcl] Other (See Comments)    VERY AGITATED, HOSTILE  . Flomax [Tamsulosin Hcl] Shortness Of Breath  . Morphine And Related Other (See Comments)    VERY AGITATED, HOSTILE  . Sulfa Antibiotics Shortness Of Breath  . Carvedilol Other (See Comments)    DISORIENTATION  . Levofloxacin Other (See Comments)    unknown    Patient Measurements: Height: 5\' 10"  (177.8 cm) Weight: 187 lb (84.823 kg) IBW/kg (Calculated) : 73  Vital Signs: Temp: 98.7 F (37.1 C) (08/20 1022) Temp Source: Oral (08/20 1022) BP: 112/59 mmHg (08/20 1215) Pulse Rate: 115 (08/20 1215)  Labs:  Recent Labs  03/21/15 1055  WBC 18.1*  HGB 12.7*  PLT 298  CREATININE 1.84*   Estimated Creatinine Clearance: 33.6 mL/min (by C-G formula based on Cr of 1.84). No results for input(s): VANCOTROUGH, VANCOPEAK, VANCORANDOM, GENTTROUGH, GENTPEAK, GENTRANDOM, TOBRATROUGH, TOBRAPEAK, TOBRARND, AMIKACINPEAK, AMIKACINTROU, AMIKACIN in the last 72 hours.   Microbiology: Recent Results (from the past 720 hour(s))  Blood culture (routine x 2)     Status: None (Preliminary result)   Collection Time: 03/21/15 10:45 AM  Result Value Ref Range Status   Specimen Description BLOOD RIGHT ANTECUBITAL  Final   Special Requests BOTTLES DRAWN AEROBIC AND ANAEROBIC 10ML  Final   Culture PENDING  Incomplete   Report Status PENDING  Incomplete    Medical History: Past Medical History  Diagnosis Date  . GERD (gastroesophageal reflux disease)   . Coronary artery disease     History of remote anterior MI with PCI to LAD in 2006; most recent cath 2007, no intervention required  . Chronic systolic dysfunction of left ventricle     EF 30-35%, CLASS II - III SYMPTOMS; intolerant to Coreg  . Hyperlipidemia   .  PVC's (premature ventricular contractions)   . DOE (dyspnea on exertion)   . Intolerance, drug     Intolerant to beta blockers  . BPH (benign prostatic hyperplasia)   . CHF (congestive heart failure)   . COPD (chronic obstructive pulmonary disease)   . AICD (automatic cardioverter/defibrillator) present 03/30/2011    Analyze ST study patient  . Heart murmur     hx  . Myocardial infarction 1994    ANTERIOR  . Pneumonia "several times"  . Type II diabetes mellitus   . CRF (chronic renal failure)   . CKD (chronic kidney disease) stage 3, GFR 30-59 ml/min 06/08/2012  . Skin cancer "several"    "forearms; head"  . Memory loss   . Anginal pain   . Dysrhythmia    Assessment: 81 year old male with a history of reflux, CAD, CHF with an EF of 35%, and recent pneumonia admitted with SOB an cough.  Pharmacy asked to begin Vancomycin and Zosyn. Scr = 1.84 with a CrCl of approximately 33 ml / min  Goal of Therapy:  Vancomycin trough level 15-20 mcg/ml  Appropriate Zosyn dosing   Plan:  Zosyn 3.375 grams iv Q 8 hours - 4 hr infusion Vancomycin 1250 mg iv Q 24 hours Follow up Scr, cultures, fever trend, progress  Thank you. Anette Guarneri, PharmD [phone]  03/21/2015,12:36 PM

## 2015-03-21 NOTE — H&P (Addendum)
[facility] History and Physical  DONZEL ROMACK HWY:[id number] DOB: 1933/12/21 DOA: 03/21/2015  Referring physician:  PCP: [name], MD   Chief Complaint: Subjective fevers, chills, possible aspiration  HPI: Antonio Ray is a 81 y.o. male with a past medical history of chronic systolic congestive heart failure having ejection fraction 35%, chronic kidney disease, established history of coronary artery disease who was recently admitted to the medicine service on 02/07/2015 at which time he presented with complaints of increasing cough and shortness of breath. He reported having intermittent spells of "choking" on his food. During that hospitalization he was treated for aspiration pneumonia. GI was consulted as calculation was suspected. He was set up to follow with GI in the outpatient setting. On 03/16/2015 he underwent esophageal motility study which revealed calculation. GI discussed treatment options with patient as there are plans for him to undergo Botox injections in the clinic. He reported going to breakfast today where he had multiple episodes of nausea and vomiting. He complains of productive cough associated with generalized weakness, fatigue, and overall not feeling well. Initial workup in the emergency department included a chest x-ray  that did not reveal acute cardiopulmonary disease.                                       Review of Systems:  Constitutional:  No weight loss, night sweats, positive for Fevers, chills, fatigue.  HEENT:  No headaches, Difficulty swallowing,Tooth/dental problems,Sore throat,  No sneezing, itching, ear ache, nasal congestion, post nasal drip,  Cardio-vascular:  No chest pain, Orthopnea, PND, swelling in lower extremities, anasarca, dizziness, palpitations  GI:  No heartburn, indigestion, abdominal pain, nausea, vomiting, diarrhea, change in bowel habits, loss of appetite  Resp:  Positive for shortness of breath with exertion or at rest.  No excess mucus, positive for productive cough, No non-productive cough, No coughing up of blood.No change in color of mucus.No wheezing.No chest wall deformity  Skin:  no rash or lesions.  GU:  no dysuria, change in color of urine, no urgency or frequency. No flank pain.  Musculoskeletal:  No joint pain or swelling. No decreased range of motion. No back pain.  Psych:  No change in mood or affect. No depression or anxiety. No memory loss.   Past Medical History  Diagnosis Date  . GERD (gastroesophageal reflux disease)   . Coronary artery disease     History of remote anterior MI with PCI to LAD in 2006; most recent cath 2007, no intervention required  . Chronic systolic dysfunction of left ventricle     EF 30-35%, CLASS II - III SYMPTOMS; intolerant to Coreg  . Hyperlipidemia   . PVC's (premature ventricular contractions)   . DOE (dyspnea on exertion)   . Intolerance, drug     Intolerant to beta blockers  . BPH (benign prostatic hyperplasia)   . CHF (congestive heart failure)   . COPD (chronic obstructive pulmonary disease)   . AICD (automatic cardioverter/defibrillator) present 03/30/2011    Analyze ST study patient  . Heart murmur     hx  . Myocardial infarction 1994    ANTERIOR  . Pneumonia "several times"  . Type II diabetes mellitus   . CRF (chronic renal failure)   . CKD (chronic kidney disease) stage 3, GFR 30-59 ml/min 06/08/2012  . Skin cancer "several"    "forearms; head"  . Memory loss   .  Anginal pain   . Dysrhythmia    Past Surgical History  Procedure Laterality Date  . Foot fracture surgery Right 1980's  . Cholecystectomy  2/11  . Cataract extraction w/ intraocular lens  implant, bilateral  08/2014-09/2014  . Cardiac catheterization  01/11/2006    DEMONSTRATES AKINESIA OF THE DISTAL ANTERIOR WALL, DISTAL INFERIOR WALL AND AKINESIA OF THE APEX. THE BASAL SEGMENTS CONTRACT WELL AND OVERALL EF 35%  . Coronary angioplasty  1994    TO THE LAD  . Skin cancer  excision  "several"    "forearms, head"  . Insert / replace / remove pacemaker    . Esophageal manometry N/A 03/16/2015    Procedure: ESOPHAGEAL MANOMETRY (EM);  Surgeon: Jerene Bears, MD;  Location: [facility];  Service: Gastroenterology;  Laterality: N/A;   Social History:  reports that he quit smoking about 22 years ago. His smoking use included Cigarettes. He has a 35 pack-year smoking history. He has never used smokeless tobacco. He reports that he drinks alcohol. He reports that he does not use illicit drugs.  Allergies  Allergen Reactions  . Codeine Other (See Comments)    Gets very angry, disoriented  . Dilaudid [Hydromorphone Hcl] Other (See Comments)    VERY AGITATED, HOSTILE  . Flomax [Tamsulosin Hcl] Shortness Of Breath  . Morphine And Related Other (See Comments)    VERY AGITATED, HOSTILE  . Sulfa Antibiotics Shortness Of Breath  . Carvedilol Other (See Comments)    DISORIENTATION  . Levofloxacin Other (See Comments)    unknown    Family History  Problem Relation Age of Onset  . Stroke Father     Mother history unknown - never met her    Prior to Admission medications   Medication Sig Start Date End Date Taking? Authorizing Provider  aspirin 81 MG EC tablet Take 81 mg by mouth daily.     Yes Historical Provider, MD  budesonide-formoterol (SYMBICORT) 160-4.5 MCG/ACT inhaler Inhale 2 puffs into the lungs 2 (two) times daily. 09/19/14  Yes Juanito Doom, MD  Cholecalciferol (VITAMIN D PO) Take 2,000 Units by mouth daily.    Yes Historical Provider, MD  donepezil (ARICEPT) 10 MG tablet Take 10 mg by mouth every morning.   Yes Historical Provider, MD  fenofibrate (TRICOR) 145 MG tablet TAKE 1 TABLET BY MOUTH EVERY DAY 01/22/15  Yes Peter M Martinique, MD  furosemide (LASIX) 20 MG tablet TAKE 1 TABLET BY MOUTH EVERY DAY 09/19/14  Yes Peter M Martinique, MD  insulin glargine (LANTUS) 100 UNIT/ML injection Inject 24 Units into the skin every morning.    Yes Historical Provider,  MD  losartan (COZAAR) 25 MG tablet Take 1 tablet (25 mg total) by mouth daily. 03/18/15  Yes Peter M Martinique, MD  memantine (NAMENDA) 10 MG tablet Take 1 tablet (10 mg total) by mouth 2 (two) times daily. 01/19/15  Yes Marcial Pacas, MD  Multiple Vitamins-Minerals (PRESERVISION AREDS 2) CAPS Take 1 capsule by mouth 2 (two) times daily.   Yes Historical Provider, MD  pantoprazole (PROTONIX) 20 MG tablet TAKE 1 TABLET BY MOUTH EVERY DAY 07/18/14  Yes Inda Castle, MD  pravastatin (PRAVACHOL) 40 MG tablet TAKE 1 TABLET BY MOUTH EVERY EVENING 02/20/15  Yes Peter M Martinique, MD  PROVENTIL HFA 108 (90 BASE) MCG/ACT inhaler Inhale 2 puffs into the lungs every 6 (six) hours as needed for wheezing or shortness of breath. As directed 05/24/13  Yes Historical Provider, MD  tiotropium (SPIRIVA) 18 MCG inhalation  capsule Place 1 capsule (18 mcg total) into inhaler and inhale daily. 03/12/15  Yes Juanito Doom, MD   Physical Exam: Filed Vitals:   03/21/15 1145 03/21/15 1200 03/21/15 1215 03/21/15 1230  BP: 112/45 120/42 112/59 99/46  Pulse: 114 116 115 115  Temp:      TempSrc:      Resp: 27 30 31 29   Height:      Weight:      SpO2: 100% 100% 97% 95%    Wt Readings from Last 3 Encounters:  03/21/15 84.823 kg (187 lb)  03/18/15 83.915 kg (185 lb)  03/18/15 84.46 kg (186 lb 3.2 oz)    General:  Appears calm and comfortable, in no acute distress. Nontoxic-appearing. Eyes: PERRL, normal lids, irises & conjunctiva ENT: grossly normal hearing, lips & tongue Neck: no LAD, masses or thyromegaly Cardiovascular: RRR, no m/r/g. No LE edema. Telemetry: SR, no arrhythmias  Respiratory: CTA bilaterally, no w/r/r. Normal respiratory effort. Abdomen: soft, ntnd Skin: no rash or induration seen on limited exam Musculoskeletal: grossly normal tone BUE/BLE Psychiatric: grossly normal mood and affect, speech fluent and appropriate Neurologic: grossly non-focal.          Labs on Admission:  Basic Metabolic  Panel:  Recent Labs Lab 03/21/15 1055  NA 140  K 3.7  CL 103  CO2 26  GLUCOSE 190*  BUN 33*  CREATININE 1.84*  CALCIUM 9.2   Liver Function Tests:  Recent Labs Lab 03/21/15 1055  AST 33  ALT 24  ALKPHOS 46  BILITOT 0.4  PROT 6.8  ALBUMIN 3.1*   No results for input(s): LIPASE, AMYLASE in the last 168 hours. No results for input(s): AMMONIA in the last 168 hours. CBC:  Recent Labs Lab 03/21/15 1055  WBC 18.1*  NEUTROABS 15.1*  HGB 12.7*  HCT 40.4  MCV 87.8  PLT 298   Cardiac Enzymes: No results for input(s): CKTOTAL, CKMB, CKMBINDEX, TROPONINI in the last 168 hours.  BNP (last 3 results)  Recent Labs  01/13/15 2059 02/07/15 1300 03/21/15 1055  BNP 176.5* 131.2* 100.9*    ProBNP (last 3 results) No results for input(s): PROBNP in the last 8760 hours.  CBG: No results for input(s): GLUCAP in the last 168 hours.  Radiological Exams on Admission: Dg Chest Port 1 View  03/21/2015   CLINICAL DATA:  Short of breath.  Initial encounter.  EXAM: PORTABLE CHEST - 1 VIEW  COMPARISON:  03/18/2015.  09/10/2013.  FINDINGS: Cardiopericardial silhouette within normal limits. Mediastinal contours normal. Trachea midline. No airspace disease or effusion. Two lead LEFT subclavian cardiac pacemaker. Monitoring leads project over the chest.  IMPRESSION: No active cardiopulmonary disease.   Electronically Signed   By: Dereck Ligas M.D.   On: 03/21/2015 11:41    EKG: Independently reviewed.   Assessment/Plan Active Problems:   Aspiration into airway   Achalasia   Chronic systolic congestive heart failure   S/P ICD (internal cardiac defibrillator) procedure   CKD (chronic kidney disease) stage 3, GFR 30-59 ml/min   1. Possible aspiration. Patient is a 81 year old woman admitted in the month of July for aspiration pneumonia, evaluated by GI. He was set up for outpatient esophageal motility study which was done on 03/16/2015. This showed findings consistent with  achalasia. Initial chest x-ray did not reveal acute infiltrate however CBC did show white count of 18,100.. Will treat empirically with Zosyn and repeat chest x-ray in a.m. Repeat CBC in a.m. 2. Achalasia. Patient's undergoing esophageal motility study on  03/16/2015 that showed findings consistent with achalasia. Family members reporting she has an upcoming appointment with GI to undergo Botox injections in the clinic. 3. History of chronic systolic congestive heart failure. His last transthoracic echocardiogram performed on 01/14/2015 that showed an EF of 35-40%. On exam he does not appear decompensated and I doubt acute CHF contributing to symptoms. Chest x-ray did not reveal pulmonary edema. Will continue his home dose of Lasix at 20 g by mouth daily 4. Stage III chronic disease. Patient have a baseline creatinine near 1.8. Initial lab work showing creatinine 1.84 we'll continue to monitor his kidney function. 5. History of hypertension. Patient having soft blood pressures on admission, will hold his Cozaar for now. Follow blood pressures 6. DVT prophylaxis per Lovenox   Code Status: Discussed CODE STATUS with patient and daughter present at bedside, he is a full code Family Communication: Spoke to his daughter was present at bedside Disposition Plan: Will admit patient to the inpatient service, anticipate him requiring greater than 2 nights hospitalization  Time spent: 70 min  Kelvin Cellar [facility] Pager [phone]

## 2015-03-22 ENCOUNTER — Inpatient Hospital Stay (HOSPITAL_COMMUNITY): Payer: Medicare Other

## 2015-03-22 DIAGNOSIS — T17998A Other foreign object in respiratory tract, part unspecified causing other injury, initial encounter: Secondary | ICD-10-CM

## 2015-03-22 DIAGNOSIS — K220 Achalasia of cardia: Secondary | ICD-10-CM

## 2015-03-22 DIAGNOSIS — Z95810 Presence of automatic (implantable) cardiac defibrillator: Secondary | ICD-10-CM

## 2015-03-22 DIAGNOSIS — N183 Chronic kidney disease, stage 3 (moderate): Secondary | ICD-10-CM

## 2015-03-22 LAB — URINALYSIS, ROUTINE W REFLEX MICROSCOPIC
Bilirubin Urine: NEGATIVE
GLUCOSE, UA: 100 mg/dL — AB
Hgb urine dipstick: NEGATIVE
Ketones, ur: 15 mg/dL — AB
LEUKOCYTES UA: NEGATIVE
Nitrite: NEGATIVE
PROTEIN: NEGATIVE mg/dL
SPECIFIC GRAVITY, URINE: 1.024 (ref 1.005–1.030)
Urobilinogen, UA: 1 mg/dL (ref 0.0–1.0)
pH: 5.5 (ref 5.0–8.0)

## 2015-03-22 LAB — CBC
HEMATOCRIT: 36.4 % — AB (ref 39.0–52.0)
HEMOGLOBIN: 11.1 g/dL — AB (ref 13.0–17.0)
MCH: 26.7 pg (ref 26.0–34.0)
MCHC: 30.5 g/dL (ref 30.0–36.0)
MCV: 87.5 fL (ref 78.0–100.0)
Platelets: 310 10*3/uL (ref 150–400)
RBC: 4.16 MIL/uL — AB (ref 4.22–5.81)
RDW: 14.4 % (ref 11.5–15.5)
WBC: 19.2 10*3/uL — ABNORMAL HIGH (ref 4.0–10.5)

## 2015-03-22 LAB — BASIC METABOLIC PANEL
ANION GAP: 10 (ref 5–15)
BUN: 36 mg/dL — ABNORMAL HIGH (ref 6–20)
CALCIUM: 8.9 mg/dL (ref 8.9–10.3)
CHLORIDE: 106 mmol/L (ref 101–111)
CO2: 25 mmol/L (ref 22–32)
Creatinine, Ser: 1.94 mg/dL — ABNORMAL HIGH (ref 0.61–1.24)
GFR calc non Af Amer: 31 mL/min — ABNORMAL LOW (ref >60)
GFR, EST AFRICAN AMERICAN: 36 mL/min — AB (ref >60)
GLUCOSE: 189 mg/dL — AB (ref 65–99)
POTASSIUM: 4.2 mmol/L (ref 3.5–5.1)
Sodium: 141 mmol/L (ref 135–145)

## 2015-03-22 LAB — GLUCOSE, CAPILLARY
Glucose-Capillary: 95 mg/dL (ref 65–99)
Glucose-Capillary: 154 mg/dL — ABNORMAL HIGH (ref 65–99)
Glucose-Capillary: 117 mg/dL — ABNORMAL HIGH (ref 65–99)

## 2015-03-22 MED ORDER — LISINOPRIL 5 MG PO TABS
5.0000 mg | ORAL_TABLET | Freq: Every day | ORAL | Status: DC
  Administered 2015-03-23: 5 mg via ORAL
  Filled 2015-03-22 (×3): qty 1

## 2015-03-22 MED ORDER — INSULIN ASPART 100 UNIT/ML ~~LOC~~ SOLN
0.0000 [IU] | Freq: Three times a day (TID) | SUBCUTANEOUS | Status: DC
  Administered 2015-03-22 – 2015-03-23 (×2): 2 [IU] via SUBCUTANEOUS
  Administered 2015-03-23 (×2): 1 [IU] via SUBCUTANEOUS
  Administered 2015-03-24: 2 [IU] via SUBCUTANEOUS
  Administered 2015-03-24: 3 [IU] via SUBCUTANEOUS

## 2015-03-22 MED ORDER — INSULIN ASPART 100 UNIT/ML ~~LOC~~ SOLN: 0.0000 [IU] | Freq: Three times a day (TID) | SUBCUTANEOUS | Status: DC

## 2015-03-22 NOTE — Consult Note (Signed)
Referring Provider: [facility] Primary Care Physician:  [name], MD Primary Gastroenterologist:  Dr. [name]  Reason for Consultation:  Achalasia, aspiration pneumonia      HPI: Antonio Ray is a 81 y.o. male who is known to Dr. [name] from prior evaluation for dysphagia. He has a history of CAD and cardiomyopathy (A EF 35-40%), status post ICD in 2012, severe COPD, stage III CK D, dementia, IBD DM, chronic left ventricular apical thrombus. In February 2011 he had gallstone pancreatitis and a laparoscopic cholecystectomy( no ERCP). He is on 81 mg aspirin daily. He was evaluated by Dr. [name] in April 2015 for dysphagia. At the time he had regurgitation of gastric contents postprandially. He had a barium swallow in April 2015 that showed distal esophageal narrowing, cannot rule out stricture, rule out achalasia though esophagus not dilated, tertiary contractions noted. Patient decided against EGD. He was then admitted June 14 through June 16 with a COPD flare/? Pneumonia. He was discharged on Zithromax and prednisone. He was readmitted July 10 with pneumonia which was suspected to be secondary to aspiration. X-ray revealed a right sided pneumonia. He had a bedside swallowing evaluation on July 10" signs of a primary esophageal dysphagia. Trials of puree and small bites of solids followed by liquids result in grimacing and globus followed by expectoration of PO and mucous...  Given severity of deficits, risk of postprandial aspiration high. Recommend f/u with GI as an inpatient to address pts dysphagia" he had an esophageal manometry on 03/16/2015 that revealed achalasia. He was advised to undergo EGD with Botox. In the meantime, he has developed a productive cough with weakness, fatigue, and malaise. He came to the emergency room on August 20, chest x-ray had no active cardiopulmonary disease. Repeat chest x-ray earlier this morning showed right mid lower lung opacities likely  representing scarring. White blood count on admission was 18,100. Patient was treated empirically with Zosyn. Patient reports that he has been having intermittent dysphagia to solids and liquids. He admits to frequently feeling as if he has a lump in his chest especially after drinking cold liquids. Denies frequent heartburn.   Past Medical History  Diagnosis Date  . GERD (gastroesophageal reflux disease)   . Coronary artery disease     History of remote anterior MI with PCI to LAD in 2006; most recent cath 2007, no intervention required  . Chronic systolic dysfunction of left ventricle     EF 30-35%, CLASS II - III SYMPTOMS; intolerant to Coreg  . Hyperlipidemia   . PVC's (premature ventricular contractions)   . DOE (dyspnea on exertion)   . Intolerance, drug     Intolerant to beta blockers  . BPH (benign prostatic hyperplasia)   . CHF (congestive heart failure)   . COPD (chronic obstructive pulmonary disease)   . AICD (automatic cardioverter/defibrillator) present 03/30/2011    Analyze ST study patient  . Heart murmur     hx  . Myocardial infarction 1994    ANTERIOR  . Pneumonia "several times"  . Type II diabetes mellitus   . CRF (chronic renal failure)   . CKD (chronic kidney disease) stage 3, GFR 30-59 ml/min 06/08/2012  . Skin cancer "several"    "forearms; head"  . Memory loss   . Anginal pain   . Dysrhythmia     Past Surgical History  Procedure Laterality Date  . Foot fracture surgery Right 1980's  . Cholecystectomy  2/11  . Cataract extraction w/ intraocular lens  implant, bilateral  08/2014-09/2014  . Cardiac catheterization  01/11/2006    DEMONSTRATES AKINESIA OF THE DISTAL ANTERIOR WALL, DISTAL INFERIOR WALL AND AKINESIA OF THE APEX. THE BASAL SEGMENTS CONTRACT WELL AND OVERALL EF 35%  . Coronary angioplasty  1994    TO THE LAD  . Skin cancer excision  "several"    "forearms, head"  . Insert / replace / remove pacemaker    . Esophageal manometry N/A 03/16/2015     Procedure: ESOPHAGEAL MANOMETRY (EM);  Surgeon: Jerene Bears, MD;  Location: [facility];  Service: Gastroenterology;  Laterality: N/A;    Prior to Admission medications   Medication Sig Start Date End Date Taking? Authorizing Provider  aspirin 81 MG EC tablet Take 81 mg by mouth daily.     Yes Historical Provider, MD  budesonide-formoterol (SYMBICORT) 160-4.5 MCG/ACT inhaler Inhale 2 puffs into the lungs 2 (two) times daily. 09/19/14  Yes Juanito Doom, MD  Cholecalciferol (VITAMIN D PO) Take 2,000 Units by mouth daily.    Yes Historical Provider, MD  donepezil (ARICEPT) 10 MG tablet Take 10 mg by mouth every morning.   Yes Historical Provider, MD  fenofibrate (TRICOR) 145 MG tablet TAKE 1 TABLET BY MOUTH EVERY DAY 01/22/15  Yes Peter M Martinique, MD  furosemide (LASIX) 20 MG tablet TAKE 1 TABLET BY MOUTH EVERY DAY 09/19/14  Yes Peter M Martinique, MD  insulin glargine (LANTUS) 100 UNIT/ML injection Inject 24 Units into the skin every morning.    Yes Historical Provider, MD  losartan (COZAAR) 25 MG tablet Take 1 tablet (25 mg total) by mouth daily. 03/18/15  Yes Peter M Martinique, MD  memantine (NAMENDA) 10 MG tablet Take 1 tablet (10 mg total) by mouth 2 (two) times daily. 01/19/15  Yes Marcial Pacas, MD  Multiple Vitamins-Minerals (PRESERVISION AREDS 2) CAPS Take 1 capsule by mouth 2 (two) times daily.   Yes Historical Provider, MD  pantoprazole (PROTONIX) 20 MG tablet TAKE 1 TABLET BY MOUTH EVERY DAY 07/18/14  Yes Inda Castle, MD  pravastatin (PRAVACHOL) 40 MG tablet TAKE 1 TABLET BY MOUTH EVERY EVENING 02/20/15  Yes Peter M Martinique, MD  PROVENTIL HFA 108 (90 BASE) MCG/ACT inhaler Inhale 2 puffs into the lungs every 6 (six) hours as needed for wheezing or shortness of breath. As directed 05/24/13  Yes Historical Provider, MD  tiotropium (SPIRIVA) 18 MCG inhalation capsule Place 1 capsule (18 mcg total) into inhaler and inhale daily. 03/12/15  Yes Juanito Doom, MD    Current Facility-Administered  Medications  Medication Dose Route Frequency Provider Last Rate Last Dose  . acetaminophen (TYLENOL) tablet 650 mg  650 mg Oral Q6H PRN Kelvin Cellar, MD      . albuterol (PROVENTIL) (2.5 MG/3ML) 0.083% nebulizer solution 3 mL  3 mL Inhalation Q6H PRN Kelvin Cellar, MD      . alum & mag hydroxide-simeth (MAALOX/MYLANTA) 200-200-20 MG/5ML suspension 30 mL  30 mL Oral Q6H PRN Kelvin Cellar, MD      . aspirin EC tablet 81 mg  81 mg Oral Daily Kelvin Cellar, MD   81 mg at 03/22/15 0955  . budesonide-formoterol (SYMBICORT) 160-4.5 MCG/ACT inhaler 2 puff  2 puff Inhalation BID Kelvin Cellar, MD   2 puff at 03/22/15 0751  . donepezil (ARICEPT) tablet 10 mg  10 mg Oral q morning - 10a Kelvin Cellar, MD   10 mg at 03/22/15 0955  . enoxaparin (LOVENOX) injection 40 mg  40 mg Subcutaneous Q24H Kelvin Cellar, MD   40  mg at 03/21/15 2038  . fenofibrate tablet 54 mg  54 mg Oral Daily Kelvin Cellar, MD   54 mg at 03/22/15 0955  . furosemide (LASIX) tablet 20 mg  20 mg Oral Daily Kelvin Cellar, MD   20 mg at 03/22/15 0955  . insulin glargine (LANTUS) injection 24 Units  24 Units Subcutaneous q morning - 10a Kelvin Cellar, MD   24 Units at 03/22/15 [phone]  . memantine (NAMENDA) tablet 10 mg  10 mg Oral BID Kelvin Cellar, MD   10 mg at 03/22/15 0955  . multivitamin-lutein (OCUVITE-LUTEIN) capsule 1 capsule  1 capsule Oral BID Kelvin Cellar, MD   1 capsule at 03/22/15 1054  . ondansetron (ZOFRAN) injection 4 mg  4 mg Intravenous Q6H PRN Kelvin Cellar, MD      . pantoprazole (PROTONIX) EC tablet 20 mg  20 mg Oral Daily Kelvin Cellar, MD   20 mg at 03/22/15 0955  . piperacillin-tazobactam (ZOSYN) IVPB 3.375 g  3.375 g Intravenous 3 times per day Kelvin Cellar, MD   3.375 g at 03/22/15 0551  . pravastatin (PRAVACHOL) tablet 40 mg  40 mg Oral QPM Kelvin Cellar, MD   40 mg at 03/21/15 1824  . sodium chloride 0.9 % injection 3 mL  3 mL Intravenous Q12H Kelvin Cellar, MD   3 mL at 03/22/15  1000  . tiotropium (SPIRIVA) inhalation capsule 18 mcg  18 mcg Inhalation Daily Kelvin Cellar, MD   18 mcg at 03/22/15 7628    Allergies as of 03/21/2015 - Review Complete 03/21/2015  Allergen Reaction Noted  . Codeine Other (See Comments)   . Dilaudid [hydromorphone hcl] Other (See Comments) 09/10/2013  . Flomax [tamsulosin hcl] Shortness Of Breath 05/19/2012  . Morphine and related Other (See Comments) 05/20/2011  . Sulfa antibiotics Shortness Of Breath 05/23/2012  . Carvedilol Other (See Comments) 04/18/2011  . Levofloxacin Other (See Comments) 05/20/2011    Family History  Problem Relation Age of Onset  . Stroke Father     Mother history unknown - never met her    Social History   Social History  . Marital Status: Widowed    Spouse Name: N/A  . Number of Children: 2  . Years of Education: GED   Occupational History  . 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 Smoking status: Former Smoker -- 1.00 packs/day for 35 years    Types: Cigarettes    Quit date: 08/01/1992  . Smokeless tobacco: Never Used  . Alcohol Use: 0.0 oz/week    0 Standard drinks or equivalent per week     Comment: Approximately a beer twice yearly  . Drug Use: No  . Sexual Activity: Not Currently   Other Topics Concern  . Not on file   Social History Narrative   Pt lives in [place] alone.  Widowed.   Right-handed.   Rare caffeine use - maybe one cup per month.    Review of Systems: Gen: Patient reports fever, chills, malaise, and fatigue prior to admission. CV: Denies chest pain, angina, palpitations, syncope, orthopnea, PND, peripheral edema, and claudication. Resp: Has a productive cough GI: Denies vomiting blood, jaundice, and fecal incontinence. Has been having intermittent dysphagia to solids and liquids and frequent globus sensation. GU : Denies urinary burning, blood in urine, urinary frequency, urinary  hesitancy, nocturnal urination, and urinary incontinence. MS: Denies joint pain, limitation of movement,  and swelling, stiffness, low back pain, extremity pain. Denies muscle weakness, cramps, atrophy.  Derm: Denies rash, itching, dry skin, hives, moles, warts, or unhealing ulcers.  Psych: Denies depression, anxiety, memory loss, suicidal ideation, hallucinations, paranoia, and confusion. Heme: Denies bruising, bleeding, and enlarged lymph nodes. Neuro:  Denies any headaches, dizziness, paresthesias.  Physical Exam: Vital signs in last 24 hours: Temp:  [97.7 F (36.5 C)-98.3 F (36.8 C)] 98.3 F (36.8 C) (08/21 0536) Pulse Rate:  [79-116] 80 (08/21 0536) Resp:  [20-50] 20 (08/21 0536) BP: (99-120)/(42-66) 108/59 mmHg (08/21 0536) SpO2:  [94 %-100 %] 97 % (08/21 0753) Weight:  [183 lb 3.2 oz (83.1 kg)-185 lb 3.2 oz (84.006 kg)] 183 lb 3.2 oz (83.1 kg) (08/21 0536) Last BM Date: 03/20/15 General:   Alert,  Well-developed, well-nourished, pleasant and cooperative in NAD sitting in chair at bedside. Head:  Normocephalic and atraumatic. Eyes:  Sclera clear, no icterus. Conjunctiva pink. Ears:  Normal auditory acuity. Nose:  No deformity, discharge,  or lesions. Mouth:  No deformity or lesions.   Neck:  Supple; no masses or thyromegaly. Lungs:  Clear throughout to auscultation.     Heart:  Regular rate and rhythm Abdomen:  Soft,nontender, BS active,nonpalp mass or hsm.   Rectal:  Deferred  Msk:  Symmetrical without gross deformities. . Pulses:  Normal pulses noted. Extremities:  Without clubbing or edema. Neurologic:  Alert and  oriented x4;  grossly normal neurologically. Skin:  Intact without significant lesions or rashes.. Psych:  Alert and cooperative. Normal mood and affect.  Intake/Output from previous day: 08/20 0701 - 08/21 0700 In: 440 [P.O.:440] Out: 1225 [Urine:1225] Intake/Output this shift: Total I/O In: 360 [P.O.:360] Out: 175 [Urine:175]  Lab Results:  Recent  Labs  03/21/15 1055 03/22/15 0324  WBC 18.1* 19.2*  HGB 12.7* 11.1*  HCT 40.4 36.4*  PLT 298 310   BMET  Recent Labs  03/21/15 1055 03/22/15 0324  NA 140 141  K 3.7 4.2  CL 103 106  CO2 26 25  GLUCOSE 190* 189*  BUN 33* 36*  CREATININE 1.84* 1.94*  CALCIUM 9.2 8.9   LFT  Recent Labs  03/21/15 1055  PROT 6.8  ALBUMIN 3.1*  AST 33  ALT 24  ALKPHOS 46  BILITOT 0.4    Studies/Results: Dg Chest 2 View  03/22/2015   CLINICAL DATA:  Patient with history of aspiration pneumonia.  EXAM: CHEST  2 VIEW  COMPARISON:  Chest radiograph 03/21/2015  FINDINGS: Dual lead pacer apparatus overlies the left hemi thorax, stable in position. Multiple monitoring leads overlie the patient. Stable enlarged cardiac and mediastinal contours. Heterogeneous opacities within the right mid and lower lung. No definite pleural effusion. Degenerative changes thoracic spine.  IMPRESSION: Right mid lower lung opacities likely represent scarring.   Electronically Signed   By: Lovey Newcomer M.D.   On: 03/22/2015 10:07   Dg Chest Port 1 View  03/21/2015   CLINICAL DATA:  Short of breath.  Initial encounter.  EXAM: PORTABLE CHEST - 1 VIEW  COMPARISON:  03/18/2015.  09/10/2013.  FINDINGS: Cardiopericardial silhouette within normal limits. Mediastinal contours normal. Trachea midline. No airspace disease or effusion. Two lead LEFT subclavian cardiac pacemaker. Monitoring leads project over the chest.  IMPRESSION: No active cardiopulmonary disease.   Electronically Signed   By: Dereck Ligas M.D.   On: 03/21/2015 11:41    IMPRESSION/PLAN: #1. Achalasia. Esophageal manometry on August 15 showed findings consistent with achalasia. Patient will likely require EGD with Botox when  his pneumonia has cleared and he is stable.Continue PPI. #2. Possible aspiration pneumonia. On Zosyn. WBC 19.2 this morning. #3. Stage III chronic kidney disease. #4. Hypertension. Cozaar currently on hold. #5. History systolic CHF.  Transthoracic echo in June 2016 showed an EF of 35-40%.  Hvozdovic, Deloris Ping 03/22/2015,  Pager [phone]  GI Attending Note   Chart was reviewed and patient was examined. X-rays and lab were reviewed.    I agree with management and plans.  Patient has aspiration pneumonia as a complication of achalasia.  Given his multiple comorbidities I think he is a suitable candidate for Botox injection of his LES.  Will defer until pulmonary disease status has improved.  Sandy Salaam. [name], M.D., [facility] Cell [phone]

## 2015-03-22 NOTE — Progress Notes (Signed)
Utilization Review Completed.Antonio Ray T8/21/2016  

## 2015-03-22 NOTE — Progress Notes (Addendum)
Patient Demographics:    Antonio Ray, is a 81 y.o. male, DOB - [birth year], NLZ:[id number]  Admit date - 03/21/2015   Admitting Physician Kelvin Cellar, MD  Outpatient Primary MD for the patient is [name], MD  LOS - 1   Chief Complaint  Patient presents with  . Shortness of Breath        Subjective:    [name] today has, No headache, No chest pain, No abdominal pain - No Nausea, No new weakness tingling or numbness, No Cough - SOB. Does have problems falling food or liquids.   Assessment  & Plan :     1. Achalasia with dysphagia and nausea vomiting. Long-standing problem. Was due for a Botox injection therapy by Dr. [name] in the next week or so, have requested Dr. [name] to evaluate while he is admitted. Soft diet, speech to evaluate also.  2. Aspiration pneumonia. Due to #1 above, stage to evaluate for safe swallow and safe diet, continue empiric in diabetics and monitor.  3. CK V stage IV. Creatinine baseline around 1.6. Stable.  4. Chronic systolic heart failure EF 35%. Has AICD. On home dose Lasix, will continue half of home dose ARB. Not on beta blocker. Compensated this admission.  5. Dyslipidemia. Continue home dose statin.  6. Early dementia. Continue home medications, at risk for delirium.  7. DM type II. On Lantus sliding scale added. We'll monitor.  CBG (last 3)   Recent Labs  03/22/15 0659  GLUCAP 95       Code Status : Full  Family Communication  : None present  Disposition Plan  : Home 1-2 days  Consults  :  GI - [place]  Procedures  :    DVT Prophylaxis  :  Lovenox   Lab Results  Component Value Date   PLT 310 03/22/2015    Inpatient Medications  Scheduled Meds: . aspirin  81 mg Oral Daily  . budesonide-formoterol  2 puff Inhalation  BID  . donepezil  10 mg Oral q morning - 10a  . enoxaparin (LOVENOX) injection  40 mg Subcutaneous Q24H  . fenofibrate  54 mg Oral Daily  . furosemide  20 mg Oral Daily  . insulin glargine  24 Units Subcutaneous q morning - 10a  . memantine  10 mg Oral BID  . multivitamin-lutein  1 capsule Oral BID  . pantoprazole  20 mg Oral Daily  . piperacillin-tazobactam  3.375 g Intravenous 3 times per day  . pravastatin  40 mg Oral QPM  . sodium chloride  3 mL Intravenous Q12H  . tiotropium  18 mcg Inhalation Daily   Continuous Infusions:  PRN Meds:.acetaminophen **OR** [DISCONTINUED] acetaminophen, albuterol, alum & mag hydroxide-simeth, [DISCONTINUED] ondansetron **OR** ondansetron (ZOFRAN) IV  Antibiotics  :     Anti-infectives    Start     Dose/Rate Route Frequency Ordered Stop   03/21/15 2045  piperacillin-tazobactam (ZOSYN) IVPB 3.375 g     3.375 g 12.5 mL/hr over 4 Hours Intravenous 3 times per day 03/21/15 1406     03/21/15 1300  vancomycin (VANCOCIN) 1,250 mg in sodium chloride 0.9 % 250 mL IVPB  Status:  Discontinued     1,250 mg 166.7 mL/hr over 90 Minutes Intravenous Every 24  hours 03/21/15 1239 03/21/15 1406   03/21/15 1300  piperacillin-tazobactam (ZOSYN) IVPB 3.375 g  Status:  Discontinued     3.375 g 12.5 mL/hr over 240 Minutes Intravenous Every 8 hours 03/21/15 1239 03/21/15 1406        Objective:   Filed Vitals:   03/21/15 2100 03/22/15 0150 03/22/15 0536 03/22/15 0753  BP:  105/45 108/59   Pulse:  79 80   Temp:  97.7 F (36.5 C) 98.3 F (36.8 C)   TempSrc:  Oral Oral   Resp:  20 20   Height:      Weight:   83.1 kg (183 lb 3.2 oz)   SpO2: 95% 95% 94% 97%    Wt Readings from Last 3 Encounters:  03/22/15 83.1 kg (183 lb 3.2 oz)  03/18/15 83.915 kg (185 lb)  03/18/15 84.46 kg (186 lb 3.2 oz)     Intake/Output Summary (Last 24 hours) at 03/22/15 1124 Last data filed at 03/22/15 1013  Gross per 24 hour  Intake    800 ml  Output   1400 ml  Net   -600 ml       Physical Exam  Awake Alert, Oriented X 3, No new F.N deficits, Normal affect [place].AT,PERRAL Supple Neck,No JVD, No cervical lymphadenopathy appriciated.  Symmetrical Chest wall movement, Good air movement bilaterally, RLL rales RRR,No Gallops,Rubs or new Murmurs, No Parasternal Heave +ve B.Sounds, Abd Soft, No tenderness, No organomegaly appriciated, No rebound - guarding or rigidity. No Cyanosis, Clubbing or edema, No new Rash or bruise       Data Review:   Micro Results Recent Results (from the past 240 hour(s))  Blood culture (routine x 2)     Status: None (Preliminary result)   Collection Time: 03/21/15 10:45 AM  Result Value Ref Range Status   Specimen Description BLOOD RIGHT ANTECUBITAL  Final   Special Requests BOTTLES DRAWN AEROBIC AND ANAEROBIC 10ML  Final   Culture PENDING  Incomplete   Report Status PENDING  Incomplete    Radiology Reports Dg Chest 2 View  03/22/2015   CLINICAL DATA:  Patient with history of aspiration pneumonia.  EXAM: CHEST  2 VIEW  COMPARISON:  Chest radiograph 03/21/2015  FINDINGS: Dual lead pacer apparatus overlies the left hemi thorax, stable in position. Multiple monitoring leads overlie the patient. Stable enlarged cardiac and mediastinal contours. Heterogeneous opacities within the right mid and lower lung. No definite pleural effusion. Degenerative changes thoracic spine.  IMPRESSION: Right mid lower lung opacities likely represent scarring.   Electronically Signed   By: Lovey Newcomer M.D.   On: 03/22/2015 10:07   Dg Chest 2 View  03/18/2015   CLINICAL DATA:  Pneumonia.  Initial evaluation .  EXAM: CHEST  2 VIEW  COMPARISON:  None.  FINDINGS: Cardiac pacer with lead tips in right atrium right ventricle. Heart size normal. Pulmonary vascularity is normal. Mild basilar subsegmental atelectasis and/or scarring. No pleural effusion or pneumothorax.  IMPRESSION: 1. Cardiac pacer with lead tips in right atrium right ventricle. Heart size normal. 2.  Bibasilar subsegmental atelectasis and/or scarring.   Electronically Signed   By: Penn Wynne   On: 03/18/2015 14:37   Dg Chest Port 1 View  03/21/2015   CLINICAL DATA:  Short of breath.  Initial encounter.  EXAM: PORTABLE CHEST - 1 VIEW  COMPARISON:  03/18/2015.  09/10/2013.  FINDINGS: Cardiopericardial silhouette within normal limits. Mediastinal contours normal. Trachea midline. No airspace disease or effusion. Two lead LEFT subclavian cardiac pacemaker. Monitoring  leads project over the chest.  IMPRESSION: No active cardiopulmonary disease.   Electronically Signed   By: Dereck Ligas M.D.   On: 03/21/2015 11:41     CBC  Recent Labs Lab 03/21/15 1055 03/22/15 0324  WBC 18.1* 19.2*  HGB 12.7* 11.1*  HCT 40.4 36.4*  PLT 298 310  MCV 87.8 87.5  MCH 27.6 26.7  MCHC 31.4 30.5  RDW 14.2 14.4  LYMPHSABS 2.1  --   MONOABS 0.9  --   EOSABS 0.1  --   BASOSABS 0.0  --     Chemistries   Recent Labs Lab 03/21/15 1055 03/22/15 0324  NA 140 141  K 3.7 4.2  CL 103 106  CO2 26 25  GLUCOSE 190* 189*  BUN 33* 36*  CREATININE 1.84* 1.94*  CALCIUM 9.2 8.9  AST 33  --   ALT 24  --   ALKPHOS 46  --   BILITOT 0.4  --    ------------------------------------------------------------------------------------------------------------------ estimated creatinine clearance is 31.9 mL/min (by C-G formula based on Cr of 1.94). ------------------------------------------------------------------------------------------------------------------ No results for input(s): HGBA1C in the last 72 hours. ------------------------------------------------------------------------------------------------------------------ No results for input(s): CHOL, HDL, LDLCALC, TRIG, CHOLHDL, LDLDIRECT in the last 72 hours. ------------------------------------------------------------------------------------------------------------------ No results for input(s): TSH, T4TOTAL, T3FREE, THYROIDAB in the last 72  hours.  Invalid input(s): FREET3 ------------------------------------------------------------------------------------------------------------------ No results for input(s): VITAMINB12, FOLATE, FERRITIN, TIBC, IRON, RETICCTPCT in the last 72 hours.  Coagulation profile No results for input(s): INR, PROTIME in the last 168 hours.  No results for input(s): DDIMER in the last 72 hours.  Cardiac Enzymes No results for input(s): CKMB, TROPONINI, MYOGLOBIN in the last 168 hours.  Invalid input(s): CK ------------------------------------------------------------------------------------------------------------------ Invalid input(s): POCBNP   Time Spent in minutes   35   Kinser Fellman K M.D on 03/22/2015 at 11:24 AM  Between 7am to 7pm - Pager - [phone]  After 7pm go to [email or web address] - password [facility]  [facility] -  Office  [phone]

## 2015-03-23 ENCOUNTER — Telehealth: Payer: Self-pay | Admitting: Gastroenterology

## 2015-03-23 DIAGNOSIS — I5022 Chronic systolic (congestive) heart failure: Secondary | ICD-10-CM

## 2015-03-23 LAB — BASIC METABOLIC PANEL
Anion gap: 7 (ref 5–15)
BUN: 34 mg/dL — AB (ref 6–20)
CHLORIDE: 102 mmol/L (ref 101–111)
CO2: 31 mmol/L (ref 22–32)
Calcium: 8.7 mg/dL — ABNORMAL LOW (ref 8.9–10.3)
Creatinine, Ser: 1.86 mg/dL — ABNORMAL HIGH (ref 0.61–1.24)
GFR calc Af Amer: 38 mL/min — ABNORMAL LOW (ref >60)
GFR calc non Af Amer: 33 mL/min — ABNORMAL LOW (ref >60)
GLUCOSE: 128 mg/dL — AB (ref 65–99)
POTASSIUM: 4.4 mmol/L (ref 3.5–5.1)
Sodium: 140 mmol/L (ref 135–145)

## 2015-03-23 LAB — GLUCOSE, CAPILLARY
GLUCOSE-CAPILLARY: 124 mg/dL — AB (ref 65–99)
GLUCOSE-CAPILLARY: 180 mg/dL — AB (ref 65–99)
Glucose-Capillary: 146 mg/dL — ABNORMAL HIGH (ref 65–99)
Glucose-Capillary: 168 mg/dL — ABNORMAL HIGH (ref 65–99)

## 2015-03-23 LAB — CBC
HCT: 38.9 % — ABNORMAL LOW (ref 39.0–52.0)
HEMOGLOBIN: 11.9 g/dL — AB (ref 13.0–17.0)
MCH: 26.9 pg (ref 26.0–34.0)
MCHC: 30.6 g/dL (ref 30.0–36.0)
MCV: 88 fL (ref 78.0–100.0)
Platelets: 295 10*3/uL (ref 150–400)
RBC: 4.42 MIL/uL (ref 4.22–5.81)
RDW: 14.3 % (ref 11.5–15.5)
WBC: 11.9 10*3/uL — ABNORMAL HIGH (ref 4.0–10.5)

## 2015-03-23 LAB — HEMOGLOBIN A1C
Hgb A1c MFr Bld: 8.5 % — ABNORMAL HIGH (ref 4.8–5.6)
Mean Plasma Glucose: 197 mg/dL

## 2015-03-23 NOTE — Telephone Encounter (Signed)
Pt coming Thursday at noon

## 2015-03-23 NOTE — Telephone Encounter (Signed)
I spoke with the family. There is concern about delay in getting the EGD done. There is concern for a pattern of aspiration, pneumonia and waiting for an EGD. How can I facilitate the scheduling for him? How long does it typically take to clear pneumonia? Do I have to wait until he is cleared of the pneumonia to schedule an OV with you?

## 2015-03-23 NOTE — Evaluation (Signed)
Clinical/Bedside Swallow Evaluation Patient Details  Name: KAELEB EMOND MRN: [id number] Date of Birth: May 05, 1934  Today's Date: 03/23/2015 Time: SLP Start Time (ACUTE ONLY): 1207 SLP Stop Time (ACUTE ONLY): 1226 SLP Time Calculation (min) (ACUTE ONLY): 19 min  Past Medical History:  Past Medical History  Diagnosis Date  . GERD (gastroesophageal reflux disease)   . Coronary artery disease     History of remote anterior MI with PCI to LAD in 2006; most recent cath 2007, no intervention required  . Chronic systolic dysfunction of left ventricle     EF 30-35%, CLASS II - III SYMPTOMS; intolerant to Coreg  . Hyperlipidemia   . PVC's (premature ventricular contractions)   . DOE (dyspnea on exertion)   . Intolerance, drug     Intolerant to beta blockers  . BPH (benign prostatic hyperplasia)   . CHF (congestive heart failure)   . COPD (chronic obstructive pulmonary disease)   . AICD (automatic cardioverter/defibrillator) present 03/30/2011    Analyze ST study patient  . Heart murmur     hx  . Myocardial infarction 1994    ANTERIOR  . Pneumonia "several times"  . Type II diabetes mellitus   . CRF (chronic renal failure)   . CKD (chronic kidney disease) stage 3, GFR 30-59 ml/min 06/08/2012  . Skin cancer "several"    "forearms; head"  . Memory loss   . Anginal pain   . Dysrhythmia    Past Surgical History:  Past Surgical History  Procedure Laterality Date  . Foot fracture surgery Right 1980's  . Cholecystectomy  2/11  . Cataract extraction w/ intraocular lens  implant, bilateral  08/2014-09/2014  . Cardiac catheterization  01/11/2006    DEMONSTRATES AKINESIA OF THE DISTAL ANTERIOR WALL, DISTAL INFERIOR WALL AND AKINESIA OF THE APEX. THE BASAL SEGMENTS CONTRACT WELL AND OVERALL EF 35%  . Coronary angioplasty  1994    TO THE LAD  . Skin cancer excision  "several"    "forearms, head"  . Insert / replace / remove pacemaker    . Esophageal manometry N/A 03/16/2015    Procedure:  ESOPHAGEAL MANOMETRY (EM);  Surgeon: Jerene Bears, MD;  Location: [facility];  Service: Gastroenterology;  Laterality: N/A;   HPI:  81 y.o. male with a past medical history of chronic systolic congestive heart failure, chronic kidney disease, coronary artery disease,GERD, COPD, MI, pna admitted with fever and possible aspiration. Recent admission 02/07/2015 for cougn and intermittent spells of "choking" on his food and treated for aspiration pneumonia. On 03/16/2015 he underwent esophageal motility study which revealed findings consistent with achalasia. Per MD note, patient will likely require EGD with Botox when his pneumonia has cleared and he is stable. CXR Right mid lower lung opacities likely represent scarring.   Assessment / Plan / Recommendation Clinical Impression  Pt with recent diagnosis of achalasia (has not started Botox per pt) and familiar to ST from admission 01/2015.  Oral and pharyngeal phases of swallow WFL's. No s/s aspiration, initiation of swallow functional, no complaints of globus sensation. SLP educated/reviewed pt re: reflux precautions (pt very familiar and reports following strategies). Pt prefers to continue soft diet, thin liquids, pills whole in applesauce. No follow up needed.    Aspiration Risk   (mild-mod)    Diet Recommendation Thin (soft)   Medication Administration: Whole meds with liquid Compensations: Small sips/bites;Slow rate    Other  Recommendations Oral Care Recommendations: Oral care BID   Follow Up Recommendations  Frequency and Duration        Pertinent Vitals/Pain none         Swallow Study           Oral/Motor/Sensory Function Overall Oral Motor/Sensory Function: Appears within functional limits for tasks assessed   Ice Chips Ice chips: Not tested   Thin Liquid Thin Liquid: Within functional limits Presentation: Cup;Straw    Nectar Thick Nectar Thick Liquid: Not tested   Honey Thick Honey Thick Liquid: Not tested    Puree Puree: Within functional limits   Solid   GO    Solid: Within functional limits       Houston Siren 03/23/2015,1:19 PM  Orbie Pyo Ivor[employer] [phone]

## 2015-03-23 NOTE — Progress Notes (Signed)
Daily Rounding Note  03/23/2015, 10:19 AM  LOS: 2 days   SUBJECTIVE:       No complaints.  Eating D3, mech soft, diet without too much trouble.  Dry cough.  Does not feel SOB.  Last BM was yesterday.    OBJECTIVE:         Vital signs in last 24 hours:    Temp:  [97.5 F (36.4 C)-98.3 F (36.8 C)] 98.3 F (36.8 C) (08/22 0944) Pulse Rate:  [64-75] 68 (08/22 0505) Resp:  [16-20] 16 (08/22 0944) BP: (95-114)/(47-55) 95/52 mmHg (08/22 0944) SpO2:  [95 %-99 %] 97 % (08/22 0944) Weight:  [182 lb 13.8 oz (82.947 kg)] 182 lb 13.8 oz (82.947 kg) (08/22 0505) Last BM Date: 03/20/15 Filed Weights   03/21/15 1436 03/22/15 0536 03/23/15 0505  Weight: 185 lb 3.2 oz (84.006 kg) 183 lb 3.2 oz (83.1 kg) 182 lb 13.8 oz (82.947 kg)   General: somewhat frail but overall well appearing.  comfortable3   Heart: RRR Chest: clear bil but diminished BS on right.  Dry cough Abdomen: soft, ventral hernia.  No mass.  Not tender.  BS active.  Extremities: no CCE Neuro/Psych:  Pleasant, oriented x 3.  Relaxed, appropriate.  No gross weakness of deficits.  Intake/Output from previous day: 08/21 0701 - 08/22 0700 In: 3190 [P.O.:3140; IV Piggyback:50] Out: 2860 [Urine:2860]  Intake/Output this shift: Total I/O In: 243 [P.O.:240; I.V.:3] Out: 500 [Urine:500]  Lab Results:  Recent Labs  03/21/15 1055 03/22/15 0324 03/23/15 0324  WBC 18.1* 19.2* 11.9*  HGB 12.7* 11.1* 11.9*  HCT 40.4 36.4* 38.9*  PLT 298 310 295   BMET  Recent Labs  03/21/15 1055 03/22/15 0324 03/23/15 0324  NA 140 141 140  K 3.7 4.2 4.4  CL 103 106 102  CO2 26 25 31   GLUCOSE 190* 189* 128*  BUN 33* 36* 34*  CREATININE 1.84* 1.94* 1.86*  CALCIUM 9.2 8.9 8.7*   LFT  Recent Labs  03/21/15 1055  PROT 6.8  ALBUMIN 3.1*  AST 33  ALT 24  ALKPHOS 46  BILITOT 0.4   PT/INR No results for input(s): LABPROT, INR in the last 72 hours. Hepatitis Panel No  results for input(s): HEPBSAG, HCVAB, HEPAIGM, HEPBIGM in the last 72 hours.  Studies/Results: Dg Chest 2 View  03/22/2015   CLINICAL DATA:  Patient with history of aspiration pneumonia.  EXAM: CHEST  2 VIEW  COMPARISON:  Chest radiograph 03/21/2015  FINDINGS: Dual lead pacer apparatus overlies the left hemi thorax, stable in position. Multiple monitoring leads overlie the patient. Stable enlarged cardiac and mediastinal contours. Heterogeneous opacities within the right mid and lower lung. No definite pleural effusion. Degenerative changes thoracic spine.  IMPRESSION: Right mid lower lung opacities likely represent scarring.   Electronically Signed   By: Lovey Newcomer M.D.   On: 03/22/2015 10:07   Dg Chest Port 1 View  03/21/2015   CLINICAL DATA:  Short of breath.  Initial encounter.  EXAM: PORTABLE CHEST - 1 VIEW  COMPARISON:  03/18/2015.  09/10/2013.  FINDINGS: Cardiopericardial silhouette within normal limits. Mediastinal contours normal. Trachea midline. No airspace disease or effusion. Two lead LEFT subclavian cardiac pacemaker. Monitoring leads project over the chest.  IMPRESSION: No active cardiopulmonary disease.   Electronically Signed   By: Dereck Ligas M.D.   On: 03/21/2015 11:41   Scheduled Meds: . aspirin  81 mg Oral Daily  . budesonide-formoterol  2 puff  Inhalation BID  . donepezil  10 mg Oral q morning - 10a  . enoxaparin (LOVENOX) injection  40 mg Subcutaneous Q24H  . fenofibrate  54 mg Oral Daily  . furosemide  20 mg Oral Daily  . insulin aspart  0-9 Units Subcutaneous TID WC  . insulin glargine  24 Units Subcutaneous q morning - 10a  . lisinopril  5 mg Oral Daily  . memantine  10 mg Oral BID  . multivitamin-lutein  1 capsule Oral BID  . pantoprazole  20 mg Oral Daily  . piperacillin-tazobactam  3.375 g Intravenous 3 times per day  . pravastatin  40 mg Oral QPM  . sodium chloride  3 mL Intravenous Q12H  . tiotropium  18 mcg Inhalation Daily   Continuous Infusions:  PRN  Meds:.acetaminophen **OR** [DISCONTINUED] acetaminophen, albuterol, alum & mag hydroxide-simeth, [DISCONTINUED] ondansetron **OR** ondansetron (ZOFRAN) IV   ASSESMENT:   *  Achalasia, dysphagia.     *  Aspiration PNA.  Advanced COPD (no home oxygen).  CHF (s/p ICD).  On Zosyn, day 3.   *  Stage 3 CKD.   *  Ischemic CM.    *  IDDM.    *  Minor, normocytic anemia.    PLAN   *  Plan EGD with Botox injection in few weeks, after resolution of/convalescence from pna   *  Will set up ROV with Dr [name] or APP at GI office for next month.  assumiing he is doing well, can then arrange EGD/Botox. Note: pt has never had colonoscopy (Because "I dont want to go to sleep").  Wonder if it would be worthwhile to pursue colonoscopy at time of EGD? Though with advance age, no worrisome sxs sugg of polyps or neoplasia, and significant burden of comorbid dz it may be best just to stay with EGD alone.     Azucena Freed  03/23/2015, 10:19 AM Pager: [phone]

## 2015-03-23 NOTE — Telephone Encounter (Signed)
Yes, he needs to be scheduled for EGD with Botox injection

## 2015-03-23 NOTE — Plan of Care (Signed)
Problem: Phase II Progression Outcomes Goal: Wean O2 if indicated Outcome: Not Applicable Date Met:  58/68/25 On room air Goal: Progress activity as tolerated unless otherwise ordered Outcome: Completed/Met Date Met:  03/23/15 Up in chair

## 2015-03-23 NOTE — Care Management Note (Signed)
Case Management Note  Patient Details  Name: Antonio Ray MRN: [id number] Date of Birth: April 12, 1934  Subjective/Objective:        Admitted with Aspiration Pneumonia            Action/Plan: Patient lives alone with family members close by. Independent of ADL's/ continues to drive. No DME used at home. Has insurance with Medicare/ Federal BCBS/ Tricare and does not have any problem getting his medication; Pharmacy of choice is [facility]. No needs identified at this time. CM will continue to follow for DCP. Expected Discharge Date:       Possible  03/25/2015          Expected Discharge Plan:  Home/Self Care   Discharge planning Services  CM Consult    Choice offered to:  Patient  Status of Service:  In process, will continue to follow  Sherrilyn Rist [phone] 03/23/2015, 1:59 PM

## 2015-03-23 NOTE — Progress Notes (Signed)
Patient Demographics:    Antonio Ray, is a 81 y.o. male, DOB - [birth year], YIR:[id number]  Admit date - 03/21/2015   Admitting Physician Kelvin Cellar, MD  Outpatient Primary MD for the patient is [name], MD  LOS - 2   Chief Complaint  Patient presents with  . Shortness of Breath        Subjective:    [name] today has, No headache, No chest pain, No abdominal pain - No Nausea, No new weakness tingling or numbness, No Cough - SOB. Does have problems falling food or liquids. Overall feels fine.   Assessment  & Plan :   1. Achalasia with dysphagia and nausea vomiting. Long-standing problem. Was due for a Botox injection therapy by Dr. [name] in the next week or so, GI Dr. [name] following we'll defer EGD to him. His infection seems to have defervesced for now continue Soft diet, speech has been requested to see as well.  2. Aspiration pneumonia. Due to #1 above, stage to evaluate for safe swallow and safe diet, continue empiric in diabetics and monitor. Afebrile with leukocytosis almost resolved.  3. CK V stage IV. Creatinine baseline around 1.6. Stable.  4. Chronic systolic heart failure EF 35%. Has AICD. On home dose Lasix, will continue half of home dose ARB. Not on beta blocker. Compensated this admission.  5. Dyslipidemia. Continue home dose statin.  6. Early dementia. Continue home medications, at risk for delirium.  7. DM type II. On Lantus sliding scale added. We'll monitor.  CBG (last 3)   Recent Labs  03/22/15 1152 03/22/15 1631 03/23/15 0553  GLUCAP 154* 117* 124*     Code Status : Full  Family Communication  : None present  Disposition Plan  : Home 1-2 days  Consults  :  GI - [facility]  Procedures  :    DVT Prophylaxis  :  Lovenox   Lab Results    Component Value Date   PLT 295 03/23/2015    Inpatient Medications  Scheduled Meds: . aspirin  81 mg Oral Daily  . budesonide-formoterol  2 puff Inhalation BID  . donepezil  10 mg Oral q morning - 10a  . enoxaparin (LOVENOX) injection  40 mg Subcutaneous Q24H  . fenofibrate  54 mg Oral Daily  . furosemide  20 mg Oral Daily  . insulin aspart  0-9 Units Subcutaneous TID WC  . insulin glargine  24 Units Subcutaneous q morning - 10a  . lisinopril  5 mg Oral Daily  . memantine  10 mg Oral BID  . multivitamin-lutein  1 capsule Oral BID  . pantoprazole  20 mg Oral Daily  . piperacillin-tazobactam  3.375 g Intravenous 3 times per day  . pravastatin  40 mg Oral QPM  . sodium chloride  3 mL Intravenous Q12H  . tiotropium  18 mcg Inhalation Daily   Continuous Infusions:  PRN Meds:.acetaminophen **OR** [DISCONTINUED] acetaminophen, albuterol, alum & mag hydroxide-simeth, [DISCONTINUED] ondansetron **OR** ondansetron (ZOFRAN) IV  Antibiotics  :     Anti-infectives    Start     Dose/Rate Route Frequency Ordered Stop   03/21/15 2045  piperacillin-tazobactam (ZOSYN) IVPB 3.375 g     3.375 g 12.5 mL/hr over 4 Hours Intravenous 3 times  per day 03/21/15 1406     03/21/15 1300  vancomycin (VANCOCIN) 1,250 mg in sodium chloride 0.9 % 250 mL IVPB  Status:  Discontinued     1,250 mg 166.7 mL/hr over 90 Minutes Intravenous Every 24 hours 03/21/15 1239 03/21/15 1406   03/21/15 1300  piperacillin-tazobactam (ZOSYN) IVPB 3.375 g  Status:  Discontinued     3.375 g 12.5 mL/hr over 240 Minutes Intravenous Every 8 hours 03/21/15 1239 03/21/15 1406        Objective:   Filed Vitals:   03/22/15 2100 03/23/15 0210 03/23/15 0505 03/23/15 0944  BP: 113/53 110/55 102/50 95/52  Pulse: 66 64 68   Temp: 97.5 F (36.4 C) 98.3 F (36.8 C) 98 F (36.7 C) 98.3 F (36.8 C)  TempSrc: Oral Oral Oral Oral  Resp: 18 20 18 16   Height:      Weight:   82.947 kg (182 lb 13.8 oz)   SpO2: 95% 98% 99% 97%     Wt Readings from Last 3 Encounters:  03/23/15 82.947 kg (182 lb 13.8 oz)  03/18/15 83.915 kg (185 lb)  03/18/15 84.46 kg (186 lb 3.2 oz)     Intake/Output Summary (Last 24 hours) at 03/23/15 1112 Last data filed at 03/23/15 0943  Gross per 24 hour  Intake   3073 ml  Output   3185 ml  Net   -112 ml     Physical Exam  Awake Alert, Oriented X 3, No new F.N deficits, Normal affect [place].AT,PERRAL Supple Neck,No JVD, No cervical lymphadenopathy appriciated.  Symmetrical Chest wall movement, Good air movement bilaterally, RLL rales RRR,No Gallops,Rubs or new Murmurs, No Parasternal Heave +ve B.Sounds, Abd Soft, No tenderness, No organomegaly appriciated, No rebound - guarding or rigidity. No Cyanosis, Clubbing or edema, No new Rash or bruise       Data Review:   Micro Results Recent Results (from the past 240 hour(s))  Blood culture (routine x 2)     Status: None (Preliminary result)   Collection Time: 03/21/15 10:45 AM  Result Value Ref Range Status   Specimen Description BLOOD RIGHT ANTECUBITAL  Final   Special Requests BOTTLES DRAWN AEROBIC AND ANAEROBIC 10ML  Final   Culture NO GROWTH 1 DAY  Final   Report Status PENDING  Incomplete  Blood culture (routine x 2)     Status: None (Preliminary result)   Collection Time: 03/21/15 11:00 AM  Result Value Ref Range Status   Specimen Description BLOOD LEFT ANTECUBITAL  Final   Special Requests BOTTLES DRAWN AEROBIC AND ANAEROBIC 10ML  Final   Culture NO GROWTH 1 DAY  Final   Report Status PENDING  Incomplete    Radiology Reports Dg Chest 2 View  03/22/2015   CLINICAL DATA:  Patient with history of aspiration pneumonia.  EXAM: CHEST  2 VIEW  COMPARISON:  Chest radiograph 03/21/2015  FINDINGS: Dual lead pacer apparatus overlies the left hemi thorax, stable in position. Multiple monitoring leads overlie the patient. Stable enlarged cardiac and mediastinal contours. Heterogeneous opacities within the right mid and lower lung. No  definite pleural effusion. Degenerative changes thoracic spine.  IMPRESSION: Right mid lower lung opacities likely represent scarring.   Electronically Signed   By: Lovey Newcomer M.D.   On: 03/22/2015 10:07   Dg Chest 2 View  03/18/2015   CLINICAL DATA:  Pneumonia.  Initial evaluation .  EXAM: CHEST  2 VIEW  COMPARISON:  None.  FINDINGS: Cardiac pacer with lead tips in right atrium right ventricle.  Heart size normal. Pulmonary vascularity is normal. Mild basilar subsegmental atelectasis and/or scarring. No pleural effusion or pneumothorax.  IMPRESSION: 1. Cardiac pacer with lead tips in right atrium right ventricle. Heart size normal. 2. Bibasilar subsegmental atelectasis and/or scarring.   Electronically Signed   By: [facility]   On: 03/18/2015 14:37   Dg Chest Port 1 View  03/21/2015   CLINICAL DATA:  Short of breath.  Initial encounter.  EXAM: PORTABLE CHEST - 1 VIEW  COMPARISON:  03/18/2015.  09/10/2013.  FINDINGS: Cardiopericardial silhouette within normal limits. Mediastinal contours normal. Trachea midline. No airspace disease or effusion. Two lead LEFT subclavian cardiac pacemaker. Monitoring leads project over the chest.  IMPRESSION: No active cardiopulmonary disease.   Electronically Signed   By: Dereck Ligas M.D.   On: 03/21/2015 11:41     CBC  Recent Labs Lab 03/21/15 1055 03/22/15 0324 03/23/15 0324  WBC 18.1* 19.2* 11.9*  HGB 12.7* 11.1* 11.9*  HCT 40.4 36.4* 38.9*  PLT 298 310 295  MCV 87.8 87.5 88.0  MCH 27.6 26.7 26.9  MCHC 31.4 30.5 30.6  RDW 14.2 14.4 14.3  LYMPHSABS 2.1  --   --   MONOABS 0.9  --   --   EOSABS 0.1  --   --   BASOSABS 0.0  --   --     Chemistries   Recent Labs Lab 03/21/15 1055 03/22/15 0324 03/23/15 0324  NA 140 141 140  K 3.7 4.2 4.4  CL 103 106 102  CO2 26 25 31   GLUCOSE 190* 189* 128*  BUN 33* 36* 34*  CREATININE 1.84* 1.94* 1.86*  CALCIUM 9.2 8.9 8.7*  AST 33  --   --   ALT 24  --   --   ALKPHOS 46  --   --   BILITOT 0.4   --   --    ------------------------------------------------------------------------------------------------------------------ estimated creatinine clearance is 33.3 mL/min (by C-G formula based on Cr of 1.86). ------------------------------------------------------------------------------------------------------------------ No results for input(s): HGBA1C in the last 72 hours. ------------------------------------------------------------------------------------------------------------------ No results for input(s): CHOL, HDL, LDLCALC, TRIG, CHOLHDL, LDLDIRECT in the last 72 hours. ------------------------------------------------------------------------------------------------------------------ No results for input(s): TSH, T4TOTAL, T3FREE, THYROIDAB in the last 72 hours.  Invalid input(s): FREET3 ------------------------------------------------------------------------------------------------------------------ No results for input(s): VITAMINB12, FOLATE, FERRITIN, TIBC, IRON, RETICCTPCT in the last 72 hours.  Coagulation profile No results for input(s): INR, PROTIME in the last 168 hours.  No results for input(s): DDIMER in the last 72 hours.  Cardiac Enzymes No results for input(s): CKMB, TROPONINI, MYOGLOBIN in the last 168 hours.  Invalid input(s): CK ------------------------------------------------------------------------------------------------------------------ Invalid input(s): POCBNP   Time Spent in minutes  35   SINGH,PRASHANT K M.D on 03/23/2015 at 11:12 AM  Between 7am to 7pm - Pager - [phone]  After 7pm go to [email or web address] - password [facility]  [facility] -  Office  [phone]

## 2015-03-23 NOTE — Telephone Encounter (Signed)
Patient is scheduled to come in with his family on 03/26/15

## 2015-03-24 DIAGNOSIS — J441 Chronic obstructive pulmonary disease with (acute) exacerbation: Secondary | ICD-10-CM | POA: Insufficient documentation

## 2015-03-24 DIAGNOSIS — J690 Pneumonitis due to inhalation of food and vomit: Principal | ICD-10-CM

## 2015-03-24 LAB — GLUCOSE, CAPILLARY
GLUCOSE-CAPILLARY: 192 mg/dL — AB (ref 65–99)
Glucose-Capillary: 117 mg/dL — ABNORMAL HIGH (ref 65–99)
Glucose-Capillary: 221 mg/dL — ABNORMAL HIGH (ref 65–99)

## 2015-03-24 MED ORDER — LOSARTAN POTASSIUM 25 MG PO TABS: 25.0000 mg | ORAL_TABLET | Freq: Every day | ORAL | Status: DC

## 2015-03-24 MED ORDER — AMOXICILLIN-POT CLAVULANATE 875-125 MG PO TABS
1.0000 | ORAL_TABLET | Freq: Two times a day (BID) | ORAL | Status: DC
  Administered 2015-03-24: 1 via ORAL
  Filled 2015-03-24: qty 1

## 2015-03-24 MED ORDER — AMOXICILLIN-POT CLAVULANATE 500-125 MG PO TABS: 1.0000 | ORAL_TABLET | Freq: Three times a day (TID) | ORAL | Status: DC

## 2015-03-24 NOTE — Telephone Encounter (Signed)
He is a [facility] patient. He will be seeing Dr Hilarie Fredrickson on 03/26/15. It is out of my hands now.

## 2015-03-24 NOTE — Discharge Instructions (Signed)
Follow with Primary MD [name], MD in 7 days   Get CBC, CMP, 2 view Chest X ray checked  by Primary MD next visit.    Activity: As tolerated with Full fall precautions use walker/cane & assistance as needed   Disposition Home    Diet: Soft Heart Healthy , with feeding assistance and aspiration precautions.  For Heart failure patients - Check your Weight same time everyday, if you gain over 2 pounds, or you develop in leg swelling, experience more shortness of breath or chest pain, call your Primary MD immediately. Follow Cardiac Low Salt Diet and 1.5 lit/day fluid restriction.   On your next visit with your primary care physician please Get Medicines reviewed and adjusted.   Please request your Prim.MD to go over all Hospital Tests and Procedure/Radiological results at the follow up, please get all Hospital records sent to your Prim MD by signing hospital release before you go home.   If you experience worsening of your admission symptoms, develop shortness of breath, life threatening emergency, suicidal or homicidal thoughts you must seek medical attention immediately by calling 911 or calling your MD immediately  if symptoms less severe.  You Must read complete instructions/literature along with all the possible adverse reactions/side effects for all the Medicines you take and that have been prescribed to you. Take any new Medicines after you have completely understood and accpet all the possible adverse reactions/side effects.   Do not drive, operating heavy machinery, perform activities at heights, swimming or participation in water activities or provide baby sitting services if your were admitted for syncope or siezures until you have seen by Primary MD or a Neurologist and advised to do so again.  Do not drive when taking Pain medications.    Do not take more than prescribed Pain, Sleep and Anxiety Medications  Special Instructions: If you have smoked or chewed Tobacco  in  the last 2 yrs please stop smoking, stop any regular Alcohol  and or any Recreational drug use.  Wear Seat belts while driving.   Please note  You were cared for by a hospitalist during your hospital stay. If you have any questions about your discharge medications or the care you received while you were in the hospital after you are discharged, you can call the unit and asked to speak with the hospitalist on call if the hospitalist that took care of you is not available. Once you are discharged, your primary care physician will handle any further medical issues. Please note that NO REFILLS for any discharge medications will be authorized once you are discharged, as it is imperative that you return to your primary care physician (or establish a relationship with a primary care physician if you do not have one) for your aftercare needs so that they can reassess your need for medications and monitor your lab values.

## 2015-03-24 NOTE — Telephone Encounter (Signed)
Unless he switched doctors he is my patient

## 2015-03-24 NOTE — Telephone Encounter (Signed)
Schedule for Friday am after ERCP (can be done with conscious sedation).  Hold coumadin.

## 2015-03-24 NOTE — Discharge Summary (Addendum)
Antonio Ray, is a 81 y.o. male  DOB Feb 01, 1934  MRN [id number].  Admission date:  03/21/2015  Admitting Physician  Kelvin Cellar, MD  Discharge Date:  03/24/2015   Primary MD  [name], MD  Recommendations for primary care physician for things to follow:   Check a two-view chest x-ray along with CBC and BMP within a week. Outpatient follow-up with GI physician Dr. [name].   Admission Diagnosis  COPD exacerbation [J44.1] HCAP (healthcare-associated pneumonia) [J18.9] Aspiration pneumonia, unspecified aspiration pneumonia type [J69.0]   Discharge Diagnosis  COPD exacerbation [J44.1] HCAP (healthcare-associated pneumonia) [J18.9] Aspiration pneumonia, unspecified aspiration pneumonia type [J69.0]     Principal Problem:   Aspiration pneumonia Active Problems:   Chronic systolic congestive heart failure   S/P ICD (internal cardiac defibrillator) procedure   CKD (chronic kidney disease) stage 3, GFR 30-59 ml/min   Achalasia   COPD exacerbation      Past Medical History  Diagnosis Date  . GERD (gastroesophageal reflux disease)   . Coronary artery disease     History of remote anterior MI with PCI to LAD in 2006; most recent cath 2007, no intervention required  . Chronic systolic dysfunction of left ventricle     EF 30-35%, CLASS II - III SYMPTOMS; intolerant to Coreg  . Hyperlipidemia   . PVC's (premature ventricular contractions)   . DOE (dyspnea on exertion)   . Intolerance, drug     Intolerant to beta blockers  . BPH (benign prostatic hyperplasia)   . CHF (congestive heart failure)   . COPD (chronic obstructive pulmonary disease)   . AICD (automatic cardioverter/defibrillator) present 03/30/2011    Analyze ST study patient  . Heart murmur     hx  . Myocardial infarction 1994    ANTERIOR    . Pneumonia "several times"  . Type II diabetes mellitus   . CRF (chronic renal failure)   . CKD (chronic kidney disease) stage 3, GFR 30-59 ml/min 06/08/2012  . Skin cancer "several"    "forearms; head"  . Memory loss   . Anginal pain   . Dysrhythmia     Past Surgical History  Procedure Laterality Date  . Foot fracture surgery Right 1980's  . Cholecystectomy  2/11  . Cataract extraction w/ intraocular lens  implant, bilateral  08/2014-09/2014  . Cardiac catheterization  01/11/2006    DEMONSTRATES AKINESIA OF THE DISTAL ANTERIOR WALL, DISTAL INFERIOR WALL AND AKINESIA OF THE APEX. THE BASAL SEGMENTS CONTRACT WELL AND OVERALL EF 35%  . Coronary angioplasty  1994    TO THE LAD  . Skin cancer excision  "several"    "forearms, head"  . Insert / replace / remove pacemaker    . Esophageal manometry N/A 03/16/2015    Procedure: ESOPHAGEAL MANOMETRY (EM);  Surgeon: Jerene Bears, MD;  Location: [facility];  Service: Gastroenterology;  Laterality: N/A;       HPI  from the history and physical done on the day of admission:    Antonio Ray is  a 81 y.o. male with a past medical history of chronic systolic congestive heart failure having ejection fraction 35%, chronic kidney disease, established history of coronary artery disease who was recently admitted to the medicine service on 02/07/2015 at which time he presented with complaints of increasing cough and shortness of breath. He reported having intermittent spells of "choking" on his food. During that hospitalization he was treated for aspiration pneumonia. GI was consulted as calculation was suspected. He was set up to follow with GI in the outpatient setting. On 03/16/2015 he underwent esophageal motility study which revealed calculation. GI discussed treatment options with patient as there are plans for him to undergo Botox injections in the clinic. He reported going to breakfast today where he had multiple episodes of nausea and vomiting.  He complains of productive cough associated with generalized weakness, fatigue, and overall not feeling well. Initial workup in the emergency department included a chest x-ray that did not reveal acute cardiopulmonary disease.      Hospital Course:     1. Achalasia with dysphagia and nausea vomiting. Long-standing problem. Was due for a Botox injection therapy by Dr. [name] in the next week or so, GI Dr. [name] following wants to schedule outpatient EGD next few days. His infection seems to have defervesced for now continue Soft diet, will discharge today .  2. Aspiration pneumonia. Due to #1 above, in by speech and cleared for a soft diet, was treated with Zosyn here and will be transitioned to Augmentin upon discharge for 5 more days. Afebrile with leukocytosis almost resolved. We'll request primary care physician to please repeat CBC, BMP and a two-view chest x-ray next visit.  3. CK V stage IV. Creatinine baseline around 1.6. Stable.  4. Chronic systolic heart failure EF 35%. Has AICD. On home dose Lasix, will continue half of home dose ARB. Not on beta blocker. Compensated this admission.  5. Dyslipidemia. Continue home dose statin.  6. Early dementia. Continue home medications, no acute issues.  7. DM type II. Resume home regimen.       Discharge Condition: Stable  Follow UP  Follow-up Information    Follow up with [name], MD On 03/25/2015.   Specialty:  Family Medicine   Why:  Wednesday 03/25/15 at 3:30pm   Contact information:   [address] [place] [id number] [phone]       Follow up with Erskine Emery, MD On 03/26/2015.   Specialty:  Gastroenterology   Why:  [place]--@ 12 noon   Contact information:   [address] [place] [address] [phone]        Consults obtained - GI  Diet and Activity recommendation: See Discharge Instructions below  Discharge Instructions            Discharge Instructions    Discharge instructions    Complete by:  As directed   Follow with Primary MD [name], MD in 7 days   Get CBC, CMP, 2 view Chest X ray checked  by Primary MD next visit.    Activity: As tolerated with Full fall precautions use walker/cane & assistance as needed   Disposition Home    Diet: Soft Heart Healthy Low Carb, with feeding assistance and aspiration precautions.  For Heart failure patients - Check your Weight same time everyday, if you gain over 2 pounds, or you develop in leg swelling, experience more shortness of breath or chest pain, call your Primary MD immediately. Follow Cardiac Low Salt  Diet and 1.5 lit/day fluid restriction.   On your next visit with your primary care physician please Get Medicines reviewed and adjusted.   Please request your Prim.MD to go over all Hospital Tests and Procedure/Radiological results at the follow up, please get all Hospital records sent to your Prim MD by signing hospital release before you go home.   If you experience worsening of your admission symptoms, develop shortness of breath, life threatening emergency, suicidal or homicidal thoughts you must seek medical attention immediately by calling 911 or calling your MD immediately  if symptoms less severe.  You Must read complete instructions/literature along with all the possible adverse reactions/side effects for all the Medicines you take and that have been prescribed to you. Take any new Medicines after you have completely understood and accpet all the possible adverse reactions/side effects.   Do not drive, operating heavy machinery, perform activities at heights, swimming or participation in water activities or provide baby sitting services if your were admitted for syncope or siezures until you have seen by Primary MD or a Neurologist and advised to do so again.  Do not drive when taking Pain medications.    Do not take more than prescribed Pain,  Sleep and Anxiety Medications  Special Instructions: If you have smoked or chewed Tobacco  in the last 2 yrs please stop smoking, stop any regular Alcohol  and or any Recreational drug use.  Wear Seat belts while driving.   Please note  You were cared for by a hospitalist during your hospital stay. If you have any questions about your discharge medications or the care you received while you were in the hospital after you are discharged, you can call the unit and asked to speak with the hospitalist on call if the hospitalist that took care of you is not available. Once you are discharged, your primary care physician will handle any further medical issues. Please note that NO REFILLS for any discharge medications will be authorized once you are discharged, as it is imperative that you return to your primary care physician (or establish a relationship with a primary care physician if you do not have one) for your aftercare needs so that they can reassess your need for medications and monitor your lab values.     Increase activity slowly    Complete by:  As directed              Discharge Medications       Medication List    TAKE these medications        amoxicillin-clavulanate 500-125 MG per tablet  Commonly known as:  AUGMENTIN  Take 1 tablet (500 mg total) by mouth 3 (three) times daily.     aspirin 81 MG EC tablet  Take 81 mg by mouth daily.     budesonide-formoterol 160-4.5 MCG/ACT inhaler  Commonly known as:  SYMBICORT  Inhale 2 puffs into the lungs 2 (two) times daily.     donepezil 10 MG tablet  Commonly known as:  ARICEPT  Take 10 mg by mouth every morning.     fenofibrate 145 MG tablet  Commonly known as:  TRICOR  TAKE 1 TABLET BY MOUTH EVERY DAY     furosemide 20 MG tablet  Commonly known as:  LASIX  TAKE 1 TABLET BY MOUTH EVERY DAY     insulin glargine 100 UNIT/ML injection  Commonly known as:  LANTUS  Inject 24 Units into the skin every morning.  losartan 25 MG tablet  Commonly known as:  COZAAR  Take 1 tablet (25 mg total) by mouth daily.  Start taking on:  03/27/2015     memantine 10 MG tablet  Commonly known as:  NAMENDA  Take 1 tablet (10 mg total) by mouth 2 (two) times daily.     pantoprazole 20 MG tablet  Commonly known as:  PROTONIX  TAKE 1 TABLET BY MOUTH EVERY DAY     pravastatin 40 MG tablet  Commonly known as:  PRAVACHOL  TAKE 1 TABLET BY MOUTH EVERY EVENING     PRESERVISION AREDS 2 Caps  Take 1 capsule by mouth 2 (two) times daily.     PROVENTIL HFA 108 (90 BASE) MCG/ACT inhaler  Generic drug:  albuterol  Inhale 2 puffs into the lungs every 6 (six) hours as needed for wheezing or shortness of breath. As directed     tiotropium 18 MCG inhalation capsule  Commonly known as:  SPIRIVA  Place 1 capsule (18 mcg total) into inhaler and inhale daily.     VITAMIN D PO  Take 2,000 Units by mouth daily.        Major procedures and Radiology Reports - PLEASE review detailed and final reports for all details, in brief -       Dg Chest 2 View  03/22/2015   CLINICAL DATA:  Patient with history of aspiration pneumonia.  EXAM: CHEST  2 VIEW  COMPARISON:  Chest radiograph 03/21/2015  FINDINGS: Dual lead pacer apparatus overlies the left hemi thorax, stable in position. Multiple monitoring leads overlie the patient. Stable enlarged cardiac and mediastinal contours. Heterogeneous opacities within the right mid and lower lung. No definite pleural effusion. Degenerative changes thoracic spine.  IMPRESSION: Right mid lower lung opacities likely represent scarring.   Electronically Signed   By: Lovey Newcomer M.D.   On: 03/22/2015 10:07   Dg Chest 2 View  03/18/2015   CLINICAL DATA:  Pneumonia.  Initial evaluation .  EXAM: CHEST  2 VIEW  COMPARISON:  None.  FINDINGS: Cardiac pacer with lead tips in right atrium right ventricle. Heart size normal. Pulmonary vascularity is normal. Mild basilar subsegmental atelectasis and/or  scarring. No pleural effusion or pneumothorax.  IMPRESSION: 1. Cardiac pacer with lead tips in right atrium right ventricle. Heart size normal. 2. Bibasilar subsegmental atelectasis and/or scarring.   Electronically Signed   By: Preston   On: 03/18/2015 14:37   Dg Chest Port 1 View  03/21/2015   CLINICAL DATA:  Short of breath.  Initial encounter.  EXAM: PORTABLE CHEST - 1 VIEW  COMPARISON:  03/18/2015.  09/10/2013.  FINDINGS: Cardiopericardial silhouette within normal limits. Mediastinal contours normal. Trachea midline. No airspace disease or effusion. Two lead LEFT subclavian cardiac pacemaker. Monitoring leads project over the chest.  IMPRESSION: No active cardiopulmonary disease.   Electronically Signed   By: Dereck Ligas M.D.   On: 03/21/2015 11:41    Micro Results      Recent Results (from the past 240 hour(s))  Blood culture (routine x 2)     Status: None (Preliminary result)   Collection Time: 03/21/15 10:45 AM  Result Value Ref Range Status   Specimen Description BLOOD RIGHT ANTECUBITAL  Final   Special Requests BOTTLES DRAWN AEROBIC AND ANAEROBIC 10ML  Final   Culture NO GROWTH 3 DAYS  Final   Report Status PENDING  Incomplete  Blood culture (routine x 2)     Status: None (Preliminary result)   Collection  Time: 03/21/15 11:00 AM  Result Value Ref Range Status   Specimen Description BLOOD LEFT ANTECUBITAL  Final   Special Requests BOTTLES DRAWN AEROBIC AND ANAEROBIC 10ML  Final   Culture NO GROWTH 3 DAYS  Final   Report Status PENDING  Incomplete       Today   Subjective    Antonio Ray today has no headache,no chest abdominal pain,no new weakness tingling or numbness, feels much better wants to go home today.     Objective   Blood pressure 103/59, pulse 62, temperature 97.9 F (36.6 C), temperature source Oral, resp. rate 19, height 5\' 10"  (1.778 m), weight 83.008 kg (183 lb), SpO2 95 %.   Intake/Output Summary (Last 24 hours) at 03/24/15 1540 Last  data filed at 03/24/15 1343  Gross per 24 hour  Intake   1420 ml  Output   1775 ml  Net   -355 ml    Exam Awake Alert, Oriented x 3, No new F.N deficits, Normal affect [place].AT,PERRAL Supple Neck,No JVD, No cervical lymphadenopathy appriciated.  Symmetrical Chest wall movement, Good air movement bilaterally, CTAB RRR,No Gallops,Rubs or new Murmurs, No Parasternal Heave +ve B.Sounds, Abd Soft, Non tender, No organomegaly appriciated, No rebound -guarding or rigidity. No Cyanosis, Clubbing or edema, No new Rash or bruise   Data Review   CBC w Diff:  Lab Results  Component Value Date   WBC 11.9* 03/23/2015   WBC 14.7* 05/19/2012   HGB 11.9* 03/23/2015   HGB 13.2* 05/19/2012   HCT 38.9* 03/23/2015   HCT 44.5 05/19/2012   PLT 295 03/23/2015   LYMPHOPCT 11* 03/21/2015   MONOPCT 5 03/21/2015   EOSPCT 0 03/21/2015   BASOPCT 0 03/21/2015    CMP:  Lab Results  Component Value Date   NA 140 03/23/2015   K 4.4 03/23/2015   CL 102 03/23/2015   CO2 31 03/23/2015   BUN 34* 03/23/2015   CREATININE 1.86* 03/23/2015   CREATININE 1.89* 05/19/2012   PROT 6.8 03/21/2015   ALBUMIN 3.1* 03/21/2015   BILITOT 0.4 03/21/2015   ALKPHOS 46 03/21/2015   AST 33 03/21/2015   ALT 24 03/21/2015  .   Total Time in preparing paper work, data evaluation and todays exam - 35 minutes  Thurnell Lose M.D on 03/24/2015 at 3:40 PM  [facility]   Office  [phone]

## 2015-03-24 NOTE — Progress Notes (Signed)
At [place] all d/c instructions explained and given to pt and his daughter.  Verbalized understanding.  D/c off floor via w/c to awaiting transport.  Karie Kirks, Therapist, sports.

## 2015-03-24 NOTE — Progress Notes (Signed)
Pt BP92/50.  Asymptomatic.  Informed Dr. Candiss Norse, asking him if we can hold lisinopril 5mg  this am.  MD instructed it was ok to hold.  Will continue to monitor.  Camy Leder,RN.

## 2015-03-25 NOTE — Telephone Encounter (Signed)
Per Dr Hilarie Fredrickson, patient requested change in physician while inpatient in July and he has agreed to see patient.

## 2015-03-26 ENCOUNTER — Ambulatory Visit (INDEPENDENT_AMBULATORY_CARE_PROVIDER_SITE_OTHER): Payer: Medicare Other | Admitting: Internal Medicine

## 2015-03-26 ENCOUNTER — Encounter: Payer: Self-pay | Admitting: Internal Medicine

## 2015-03-26 VITALS — BP 110/52 | HR 72 | Ht 70.0 in | Wt 184.2 lb

## 2015-03-26 DIAGNOSIS — I255 Ischemic cardiomyopathy: Secondary | ICD-10-CM

## 2015-03-26 DIAGNOSIS — K220 Achalasia of cardia: Secondary | ICD-10-CM

## 2015-03-26 LAB — CULTURE, BLOOD (ROUTINE X 2)
CULTURE: NO GROWTH
Culture: NO GROWTH

## 2015-03-26 NOTE — Patient Instructions (Signed)
You have been scheduled for an endoscopy. Please follow written instructions given to you at your visit today. If you use inhalers (even only as needed), please bring them with you on the day of your procedure.   

## 2015-03-26 NOTE — Progress Notes (Signed)
Subjective:    Patient ID: Antonio Ray, male    DOB: [birth year], 81 y.o.   MRN: [id number]  HPI Antonio Ray is a 46 -year-old male with past medical history of GERD, esophageal dysphagia, several episodes of recent aspiration pneumonia, CHF, CAD, COPD, diabetes, and chronic kidney disease who is seen in follow-up. He is here today with his son. I met the patient in July during hospitalization at [facility]. He was hospitalized with aspiration pneumonia and we were consult for esophageal dysphagia. Barium esophagram suspicious for achalasia and outpatient manometry arranged. This was performed recently and consistent with achalasia. Unfortunately he was readmitted recently with recurrent aspiration pneumonia. He is here today to discuss treatment options. He continues to report intermittent spells of choking on his food. He eats very slowly and takes his time and reports eventually the food seems to get down. He has episodes of spitting up saliva and occasionally undigested food. He has been feeling better since hospital discharge. He completed antibiotics. Cough has resolved. Denies dyspnea unless he walks long distances. No chest pain.   Review of Systems As per history of present illness, otherwise negative  Current Medications, Allergies, Past Medical History, Past Surgical History, Family History and Social History were reviewed in [employer] record.     Objective:   Physical Exam There were no vitals taken for this visit. Constitutional: Well-developed and well-nourished. No distress. HEENT: Normocephalic and atraumatic. Oropharynx is clear and moist. No oropharyngeal exudate. Conjunctivae are normal.  No scleral icterus. Neck: Neck supple. Trachea midline. Cardiovascular: Normal rate, regular rhythm and intact distal pulses.  Pulmonary/chest: Effort normal and course breath sounds normal. No wheezin Abdominal: Soft, nontender, nondistended. Bowel sounds  active throughout.  Extremities: no clubbing, cyanosis, or edema Lymphadenopathy: No cervical adenopathy noted. Neurological: Alert and oriented to person place and time. Skin: Skin is warm and dry. No rashes noted. Scattered ecchymoses bilateral upper extremities Psychiatric: Normal mood and affect. Behavior is normal.  Recent esophageal manometry --- pan-pressurization and lack of peristalsis consistent with achalasia  ESOPHOGRAM / BARIUM SWALLOW / BARIUM TABLET STUDY   TECHNIQUE: Combined double contrast and single contrast examination performed using thick and thin barium liquid. The patient was observed with fluoroscopy swallowing a 13 mm barium sulphate tablet.   FLUOROSCOPY TIME:  Radiation Exposure Index (as provided by the fluoroscopic device): 465.78 uGy*m2   If the device does not provide the exposure index:   Fluoroscopy Time:  1 min and 30 seconds.   Number of Acquired Images:  1 non fluoro store image.   COMPARISON:  Chest CT 01/13/2015.  Esophagram 11/05/2013.   FINDINGS: Thin barium was initially administered in the erect position. The patient swallowed this without difficulty. Subsequently thick barium was administered. The esophagus demonstrates a decreased primary stripping wave with diffuse tertiary contractions and a corkscrew appearance. Even in the erect position, there is limited relaxation of the lower esophageal sphincter. The esophagus remains largely filled with contrast throughout the examination. No recurrent aspiration identified. No evidence of focal mucosal ulceration.   A barium tablet was administered. With repeated sips of water, the liquid barium was eventually cleared from the esophagus, although the barium tablet would not pass through the gastroesophageal junction. There is a probable small epiphrenic or gastric fundal diverticulum.   IMPRESSION: 1. Significant esophageal dysmotility with diffuse tertiary contractions and a  decreased primary stripping wave. There is narrowing of the distal esophagus with limited relaxation of the lower esophageal sphincter  and non passage of a barium tablet. Findings are suspicious for achalasia. 2. No mucosal ulceration or recurrent aspiration identified . 3. Endoscopic evaluation recommended.     Electronically Signed   By: Richardean Sale M.D.   On: 02/10/2015 14:21   CBC    Component Value Date/Time   WBC 11.9* 03/23/2015 0324   WBC 14.7* 05/19/2012 1022   RBC 4.42 03/23/2015 0324   RBC 4.74 05/19/2012 1022   HGB 11.9* 03/23/2015 0324   HGB 13.2* 05/19/2012 1022   HCT 38.9* 03/23/2015 0324   HCT 44.5 05/19/2012 1022   PLT 295 03/23/2015 0324   MCV 88.0 03/23/2015 0324   MCV 93.8 05/19/2012 1022   MCH 26.9 03/23/2015 0324   MCH 27.8 05/19/2012 1022   MCHC 30.6 03/23/2015 0324   MCHC 29.7* 05/19/2012 1022   RDW 14.3 03/23/2015 0324   LYMPHSABS 2.1 03/21/2015 1055   MONOABS 0.9 03/21/2015 1055   EOSABS 0.1 03/21/2015 1055   BASOSABS 0.0 03/21/2015 1055    CMP     Component Value Date/Time   NA 140 03/23/2015 0324   K 4.4 03/23/2015 0324   CL 102 03/23/2015 0324   CO2 31 03/23/2015 0324   GLUCOSE 128* 03/23/2015 0324   BUN 34* 03/23/2015 0324   CREATININE 1.86* 03/23/2015 0324   CREATININE 1.89* 05/19/2012 1046   CALCIUM 8.7* 03/23/2015 0324   PROT 6.8 03/21/2015 1055   ALBUMIN 3.1* 03/21/2015 1055   AST 33 03/21/2015 1055   ALT 24 03/21/2015 1055   ALKPHOS 46 03/21/2015 1055   BILITOT 0.4 03/21/2015 1055   GFRNONAA 33* 03/23/2015 0324   GFRAA 38* 03/23/2015 0324        Assessment & Plan:   59 -year-old male with past medical history of GERD, esophageal dysphagia, several episodes of recent aspiration pneumonia, CHF, CAD, COPD, diabetes, and chronic kidney disease who is seen in follow-up after recent diagnosis of achalasia  1. Achalasia -- I had discussion today with the patient and his son regarding the diagnosis of achalasia. He  continues to have esophageal dysphagia and this is lead to recurrent aspiration pneumonia. We discussed treatment of achalasia which includes EGD for Botox, EGD with pneumatic dilatation, and surgical myotomy. EGD with Botox likely carries the least risk but also has a has recurrence rate is Botox wears off and often has to be repeated in 6-12 months. Pneumatic dilatation is not performing [place] but he could be referred to [facility] esophageal center. Surgical myotomy would require laparoscopic surgery and general anesthesia. With either of these treatments upper endoscopy is recommended to ensure that we are dealing with only achalasia and rule out other pathology. After this thorough discussion with the patient and son prefer to start with Botox and escalate to additional therapy if ineffective. This is reasonable. We discussed EGD with Botox including the risks, benefits and alternatives and he is agreeable to proceed. This is to be performed at [facility] and the outpatient hospital setting with anesthesia assistance. Preanesthesia appointment advised given his medical history. I do think he is fit for upper endoscopy with anesthesia support at this time. He has a history of delirium after gallbladder surgery so hopefully upper endoscopy can be performed with single agent propofol, but deferred to anesthesia. Continue soft diet and remaining upright 90 minutes after eating. Continue pantoprazole 20 mg daily.  25 min spent with pt and his son.

## 2015-03-27 ENCOUNTER — Encounter (HOSPITAL_COMMUNITY): Payer: Self-pay | Admitting: *Deleted

## 2015-03-30 DIAGNOSIS — J690 Pneumonitis due to inhalation of food and vomit: Secondary | ICD-10-CM | POA: Diagnosis not present

## 2015-03-30 DIAGNOSIS — I739 Peripheral vascular disease, unspecified: Secondary | ICD-10-CM | POA: Diagnosis not present

## 2015-03-30 DIAGNOSIS — L603 Nail dystrophy: Secondary | ICD-10-CM | POA: Diagnosis not present

## 2015-03-30 DIAGNOSIS — I10 Essential (primary) hypertension: Secondary | ICD-10-CM | POA: Diagnosis not present

## 2015-03-30 DIAGNOSIS — E1142 Type 2 diabetes mellitus with diabetic polyneuropathy: Secondary | ICD-10-CM | POA: Diagnosis not present

## 2015-03-30 DIAGNOSIS — N189 Chronic kidney disease, unspecified: Secondary | ICD-10-CM | POA: Diagnosis not present

## 2015-03-30 DIAGNOSIS — L84 Corns and callosities: Secondary | ICD-10-CM | POA: Diagnosis not present

## 2015-03-30 DIAGNOSIS — E1151 Type 2 diabetes mellitus with diabetic peripheral angiopathy without gangrene: Secondary | ICD-10-CM | POA: Diagnosis not present

## 2015-03-31 ENCOUNTER — Encounter (HOSPITAL_COMMUNITY): Payer: Self-pay | Admitting: Emergency Medicine

## 2015-03-31 ENCOUNTER — Inpatient Hospital Stay (HOSPITAL_COMMUNITY)
Admission: EM | Admit: 2015-03-31 | Discharge: 2015-04-11 | DRG: 871 | Disposition: A | Discharge status: Home Health | Payer: Medicare Other | Attending: Internal Medicine | Admitting: Internal Medicine

## 2015-03-31 ENCOUNTER — Emergency Department (HOSPITAL_COMMUNITY): Payer: Medicare Other

## 2015-03-31 DIAGNOSIS — N189 Chronic kidney disease, unspecified: Secondary | ICD-10-CM

## 2015-03-31 DIAGNOSIS — K220 Achalasia of cardia: Secondary | ICD-10-CM | POA: Diagnosis not present

## 2015-03-31 DIAGNOSIS — Z794 Long term (current) use of insulin: Secondary | ICD-10-CM

## 2015-03-31 DIAGNOSIS — J441 Chronic obstructive pulmonary disease with (acute) exacerbation: Secondary | ICD-10-CM | POA: Diagnosis not present

## 2015-03-31 DIAGNOSIS — K219 Gastro-esophageal reflux disease without esophagitis: Secondary | ICD-10-CM | POA: Diagnosis present

## 2015-03-31 DIAGNOSIS — E875 Hyperkalemia: Secondary | ICD-10-CM | POA: Diagnosis present

## 2015-03-31 DIAGNOSIS — J9602 Acute respiratory failure with hypercapnia: Secondary | ICD-10-CM | POA: Diagnosis present

## 2015-03-31 DIAGNOSIS — Z4682 Encounter for fitting and adjustment of non-vascular catheter: Secondary | ICD-10-CM | POA: Diagnosis not present

## 2015-03-31 DIAGNOSIS — R05 Cough: Secondary | ICD-10-CM | POA: Diagnosis not present

## 2015-03-31 DIAGNOSIS — R7989 Other specified abnormal findings of blood chemistry: Secondary | ICD-10-CM | POA: Diagnosis present

## 2015-03-31 DIAGNOSIS — I471 Supraventricular tachycardia: Secondary | ICD-10-CM | POA: Diagnosis present

## 2015-03-31 DIAGNOSIS — Z7952 Long term (current) use of systemic steroids: Secondary | ICD-10-CM

## 2015-03-31 DIAGNOSIS — Y95 Nosocomial condition: Secondary | ICD-10-CM | POA: Diagnosis present

## 2015-03-31 DIAGNOSIS — Z4502 Encounter for adjustment and management of automatic implantable cardiac defibrillator: Secondary | ICD-10-CM | POA: Diagnosis not present

## 2015-03-31 DIAGNOSIS — I959 Hypotension, unspecified: Secondary | ICD-10-CM | POA: Diagnosis not present

## 2015-03-31 DIAGNOSIS — I252 Old myocardial infarction: Secondary | ICD-10-CM

## 2015-03-31 DIAGNOSIS — I469 Cardiac arrest, cause unspecified: Secondary | ICD-10-CM

## 2015-03-31 DIAGNOSIS — N179 Acute kidney failure, unspecified: Secondary | ICD-10-CM | POA: Diagnosis present

## 2015-03-31 DIAGNOSIS — J189 Pneumonia, unspecified organism: Secondary | ICD-10-CM | POA: Diagnosis not present

## 2015-03-31 DIAGNOSIS — Z87891 Personal history of nicotine dependence: Secondary | ICD-10-CM

## 2015-03-31 DIAGNOSIS — R0600 Dyspnea, unspecified: Secondary | ICD-10-CM | POA: Diagnosis not present

## 2015-03-31 DIAGNOSIS — Z95810 Presence of automatic (implantable) cardiac defibrillator: Secondary | ICD-10-CM | POA: Diagnosis present

## 2015-03-31 DIAGNOSIS — Z79899 Other long term (current) drug therapy: Secondary | ICD-10-CM

## 2015-03-31 DIAGNOSIS — Z9289 Personal history of other medical treatment: Secondary | ICD-10-CM

## 2015-03-31 DIAGNOSIS — R0902 Hypoxemia: Secondary | ICD-10-CM | POA: Diagnosis not present

## 2015-03-31 DIAGNOSIS — Z7982 Long term (current) use of aspirin: Secondary | ICD-10-CM | POA: Diagnosis not present

## 2015-03-31 DIAGNOSIS — Z7951 Long term (current) use of inhaled steroids: Secondary | ICD-10-CM | POA: Diagnosis not present

## 2015-03-31 DIAGNOSIS — E1122 Type 2 diabetes mellitus with diabetic chronic kidney disease: Secondary | ICD-10-CM | POA: Diagnosis present

## 2015-03-31 DIAGNOSIS — J438 Other emphysema: Secondary | ICD-10-CM | POA: Diagnosis present

## 2015-03-31 DIAGNOSIS — Z466 Encounter for fitting and adjustment of urinary device: Secondary | ICD-10-CM | POA: Diagnosis not present

## 2015-03-31 DIAGNOSIS — I255 Ischemic cardiomyopathy: Secondary | ICD-10-CM | POA: Diagnosis not present

## 2015-03-31 DIAGNOSIS — J690 Pneumonitis due to inhalation of food and vomit: Secondary | ICD-10-CM | POA: Diagnosis present

## 2015-03-31 DIAGNOSIS — R1310 Dysphagia, unspecified: Secondary | ICD-10-CM | POA: Diagnosis not present

## 2015-03-31 DIAGNOSIS — A419 Sepsis, unspecified organism: Principal | ICD-10-CM | POA: Diagnosis present

## 2015-03-31 DIAGNOSIS — Z452 Encounter for adjustment and management of vascular access device: Secondary | ICD-10-CM | POA: Diagnosis not present

## 2015-03-31 DIAGNOSIS — IMO0001 Reserved for inherently not codable concepts without codable children: Secondary | ICD-10-CM | POA: Insufficient documentation

## 2015-03-31 DIAGNOSIS — Z9861 Coronary angioplasty status: Secondary | ICD-10-CM

## 2015-03-31 DIAGNOSIS — Z882 Allergy status to sulfonamides status: Secondary | ICD-10-CM

## 2015-03-31 DIAGNOSIS — R069 Unspecified abnormalities of breathing: Secondary | ICD-10-CM | POA: Diagnosis not present

## 2015-03-31 DIAGNOSIS — J449 Chronic obstructive pulmonary disease, unspecified: Secondary | ICD-10-CM | POA: Diagnosis present

## 2015-03-31 DIAGNOSIS — I2510 Atherosclerotic heart disease of native coronary artery without angina pectoris: Secondary | ICD-10-CM | POA: Diagnosis present

## 2015-03-31 DIAGNOSIS — Z888 Allergy status to other drugs, medicaments and biological substances status: Secondary | ICD-10-CM | POA: Diagnosis not present

## 2015-03-31 DIAGNOSIS — R092 Respiratory arrest: Secondary | ICD-10-CM | POA: Diagnosis not present

## 2015-03-31 DIAGNOSIS — I517 Cardiomegaly: Secondary | ICD-10-CM | POA: Diagnosis not present

## 2015-03-31 DIAGNOSIS — Z885 Allergy status to narcotic agent status: Secondary | ICD-10-CM | POA: Diagnosis not present

## 2015-03-31 DIAGNOSIS — N183 Chronic kidney disease, stage 3 unspecified: Secondary | ICD-10-CM | POA: Diagnosis present

## 2015-03-31 DIAGNOSIS — I25119 Atherosclerotic heart disease of native coronary artery with unspecified angina pectoris: Secondary | ICD-10-CM | POA: Diagnosis present

## 2015-03-31 DIAGNOSIS — R000 Tachycardia, unspecified: Secondary | ICD-10-CM | POA: Diagnosis not present

## 2015-03-31 DIAGNOSIS — N401 Enlarged prostate with lower urinary tract symptoms: Secondary | ICD-10-CM | POA: Diagnosis not present

## 2015-03-31 DIAGNOSIS — Z9841 Cataract extraction status, right eye: Secondary | ICD-10-CM

## 2015-03-31 DIAGNOSIS — R339 Retention of urine, unspecified: Secondary | ICD-10-CM | POA: Diagnosis not present

## 2015-03-31 DIAGNOSIS — E119 Type 2 diabetes mellitus without complications: Secondary | ICD-10-CM | POA: Diagnosis not present

## 2015-03-31 DIAGNOSIS — I509 Heart failure, unspecified: Secondary | ICD-10-CM

## 2015-03-31 DIAGNOSIS — Z9842 Cataract extraction status, left eye: Secondary | ICD-10-CM

## 2015-03-31 DIAGNOSIS — R0602 Shortness of breath: Secondary | ICD-10-CM | POA: Diagnosis not present

## 2015-03-31 DIAGNOSIS — I5043 Acute on chronic combined systolic (congestive) and diastolic (congestive) heart failure: Secondary | ICD-10-CM | POA: Diagnosis not present

## 2015-03-31 DIAGNOSIS — J9601 Acute respiratory failure with hypoxia: Secondary | ICD-10-CM | POA: Diagnosis not present

## 2015-03-31 DIAGNOSIS — J9690 Respiratory failure, unspecified, unspecified whether with hypoxia or hypercapnia: Secondary | ICD-10-CM | POA: Diagnosis not present

## 2015-03-31 DIAGNOSIS — E1165 Type 2 diabetes mellitus with hyperglycemia: Secondary | ICD-10-CM | POA: Diagnosis present

## 2015-03-31 DIAGNOSIS — E785 Hyperlipidemia, unspecified: Secondary | ICD-10-CM | POA: Diagnosis present

## 2015-03-31 DIAGNOSIS — Z789 Other specified health status: Secondary | ICD-10-CM

## 2015-03-31 DIAGNOSIS — Z961 Presence of intraocular lens: Secondary | ICD-10-CM | POA: Diagnosis present

## 2015-03-31 DIAGNOSIS — D72829 Elevated white blood cell count, unspecified: Secondary | ICD-10-CM

## 2015-03-31 DIAGNOSIS — J984 Other disorders of lung: Secondary | ICD-10-CM | POA: Diagnosis present

## 2015-03-31 DIAGNOSIS — I5023 Acute on chronic systolic (congestive) heart failure: Secondary | ICD-10-CM | POA: Diagnosis present

## 2015-03-31 DIAGNOSIS — I5022 Chronic systolic (congestive) heart failure: Secondary | ICD-10-CM | POA: Diagnosis present

## 2015-03-31 DIAGNOSIS — Z978 Presence of other specified devices: Secondary | ICD-10-CM

## 2015-03-31 DIAGNOSIS — I495 Sick sinus syndrome: Secondary | ICD-10-CM | POA: Diagnosis present

## 2015-03-31 DIAGNOSIS — I9589 Other hypotension: Secondary | ICD-10-CM | POA: Diagnosis present

## 2015-03-31 DIAGNOSIS — R778 Other specified abnormalities of plasma proteins: Secondary | ICD-10-CM | POA: Diagnosis present

## 2015-03-31 LAB — CBC WITH DIFFERENTIAL/PLATELET
BASOS PCT: 0 % (ref 0–1)
Basophils Absolute: 0 10*3/uL (ref 0.0–0.1)
Basophils Absolute: 0 10*3/uL (ref 0.0–0.1)
Basophils Relative: 0 % (ref 0–1)
EOS ABS: 0 10*3/uL (ref 0.0–0.7)
EOS PCT: 0 % (ref 0–5)
Eosinophils Absolute: 0 10*3/uL (ref 0.0–0.7)
Eosinophils Relative: 0 % (ref 0–5)
HEMATOCRIT: 43.1 % (ref 39.0–52.0)
HEMATOCRIT: 38.1 % — AB (ref 39.0–52.0)
HEMOGLOBIN: 11.5 g/dL — AB (ref 13.0–17.0)
Hemoglobin: 13 g/dL (ref 13.0–17.0)
LYMPHS ABS: 2.6 10*3/uL (ref 0.7–4.0)
LYMPHS PCT: 10 % — AB (ref 12–46)
Lymphocytes Relative: 6 % — ABNORMAL LOW (ref 12–46)
Lymphs Abs: 1.1 10*3/uL (ref 0.7–4.0)
MCH: 26.6 pg (ref 26.0–34.0)
MCH: 26.5 pg (ref 26.0–34.0)
MCHC: 30.2 g/dL (ref 30.0–36.0)
MCHC: 30.2 g/dL (ref 30.0–36.0)
MCV: 88.1 fL (ref 78.0–100.0)
MCV: 87.8 fL (ref 78.0–100.0)
MONO ABS: 0.6 10*3/uL (ref 0.1–1.0)
MONOS PCT: 3 % (ref 3–12)
MONOS PCT: 3 % (ref 3–12)
Monocytes Absolute: 0.8 10*3/uL (ref 0.1–1.0)
NEUTROS ABS: 16.7 10*3/uL — AB (ref 1.7–7.7)
Neutro Abs: 22.3 10*3/uL — ABNORMAL HIGH (ref 1.7–7.7)
Neutrophils Relative %: 91 % — ABNORMAL HIGH (ref 43–77)
Neutrophils Relative %: 87 % — ABNORMAL HIGH (ref 43–77)
Platelets: 257 10*3/uL (ref 150–400)
Platelets: 222 10*3/uL (ref 150–400)
RBC: 4.89 MIL/uL (ref 4.22–5.81)
RBC: 4.34 MIL/uL (ref 4.22–5.81)
RDW: 14.4 % (ref 11.5–15.5)
RDW: 14.6 % (ref 11.5–15.5)
WBC: 18.4 10*3/uL — ABNORMAL HIGH (ref 4.0–10.5)
WBC: 25.7 10*3/uL — AB (ref 4.0–10.5)

## 2015-03-31 LAB — URINALYSIS, ROUTINE W REFLEX MICROSCOPIC
BILIRUBIN URINE: NEGATIVE
GLUCOSE, UA: 100 mg/dL — AB
HGB URINE DIPSTICK: NEGATIVE
Ketones, ur: NEGATIVE mg/dL
Leukocytes, UA: NEGATIVE
Nitrite: NEGATIVE
PROTEIN: NEGATIVE mg/dL
SPECIFIC GRAVITY, URINE: 1.012 (ref 1.005–1.030)
UROBILINOGEN UA: 0.2 mg/dL (ref 0.0–1.0)
pH: 6 (ref 5.0–8.0)

## 2015-03-31 LAB — COMPREHENSIVE METABOLIC PANEL
ALBUMIN: 3.3 g/dL — AB (ref 3.5–5.0)
ALK PHOS: 45 U/L (ref 38–126)
ALK PHOS: 39 U/L (ref 38–126)
ALT: 26 U/L (ref 17–63)
ALT: 20 U/L (ref 17–63)
AST: 33 U/L (ref 15–41)
AST: 27 U/L (ref 15–41)
Albumin: 2.8 g/dL — ABNORMAL LOW (ref 3.5–5.0)
Anion gap: 10 (ref 5–15)
Anion gap: 8 (ref 5–15)
BILIRUBIN TOTAL: 0.7 mg/dL (ref 0.3–1.2)
BUN: 30 mg/dL — ABNORMAL HIGH (ref 6–20)
BUN: 27 mg/dL — AB (ref 6–20)
CALCIUM: 9.5 mg/dL (ref 8.9–10.3)
CALCIUM: 8.1 mg/dL — AB (ref 8.9–10.3)
CO2: 28 mmol/L (ref 22–32)
CO2: 25 mmol/L (ref 22–32)
CREATININE: 1.69 mg/dL — AB (ref 0.61–1.24)
CREATININE: 1.53 mg/dL — AB (ref 0.61–1.24)
Chloride: 104 mmol/L (ref 101–111)
Chloride: 107 mmol/L (ref 101–111)
GFR calc Af Amer: 43 mL/min — ABNORMAL LOW (ref >60)
GFR calc Af Amer: 48 mL/min — ABNORMAL LOW (ref >60)
GFR calc non Af Amer: 37 mL/min — ABNORMAL LOW (ref >60)
GFR, EST NON AFRICAN AMERICAN: 42 mL/min — AB (ref >60)
GLUCOSE: 181 mg/dL — AB (ref 65–99)
GLUCOSE: 149 mg/dL — AB (ref 65–99)
POTASSIUM: 4.4 mmol/L (ref 3.5–5.1)
Potassium: 4.2 mmol/L (ref 3.5–5.1)
SODIUM: 142 mmol/L (ref 135–145)
Sodium: 140 mmol/L (ref 135–145)
TOTAL PROTEIN: 6.1 g/dL — AB (ref 6.5–8.1)
Total Bilirubin: 0.6 mg/dL (ref 0.3–1.2)
Total Protein: 7.3 g/dL (ref 6.5–8.1)

## 2015-03-31 LAB — PROTIME-INR
INR: 1.27 (ref 0.00–1.49)
Prothrombin Time: 16 seconds — ABNORMAL HIGH (ref 11.6–15.2)

## 2015-03-31 LAB — APTT: aPTT: 30 seconds (ref 24–37)

## 2015-03-31 LAB — I-STAT CG4 LACTIC ACID, ED
Lactic Acid, Venous: 2.43 mmol/L (ref 0.5–2.0)
Lactic Acid, Venous: 1.24 mmol/L (ref 0.5–2.0)

## 2015-03-31 LAB — I-STAT TROPONIN, ED: TROPONIN I, POC: 0 ng/mL (ref 0.00–0.08)

## 2015-03-31 LAB — LACTIC ACID, PLASMA
LACTIC ACID, VENOUS: 1.4 mmol/L (ref 0.5–2.0)
Lactic Acid, Venous: 1.6 mmol/L (ref 0.5–2.0)

## 2015-03-31 LAB — CORTISOL: Cortisol, Plasma: 68.2 ug/dL

## 2015-03-31 LAB — STREP PNEUMONIAE URINARY ANTIGEN: Strep Pneumo Urinary Antigen: NEGATIVE

## 2015-03-31 LAB — GLUCOSE, CAPILLARY
GLUCOSE-CAPILLARY: 79 mg/dL (ref 65–99)
GLUCOSE-CAPILLARY: 161 mg/dL — AB (ref 65–99)

## 2015-03-31 LAB — PROCALCITONIN: Procalcitonin: 2.83 ng/mL

## 2015-03-31 LAB — MRSA PCR SCREENING: MRSA BY PCR: NEGATIVE

## 2015-03-31 MED ORDER — SODIUM CHLORIDE 0.9 % IV BOLUS (SEPSIS)
1000.0000 mL | INTRAVENOUS | Status: AC
  Administered 2015-03-31 (×2): 1000 mL via INTRAVENOUS

## 2015-03-31 MED ORDER — IPRATROPIUM-ALBUTEROL 0.5-2.5 (3) MG/3ML IN SOLN
3.0000 mL | Freq: Four times a day (QID) | RESPIRATORY_TRACT | Status: DC
  Administered 2015-03-31 – 2015-04-01 (×3): 3 mL via RESPIRATORY_TRACT
  Filled 2015-03-31 (×3): qty 3

## 2015-03-31 MED ORDER — INSULIN GLARGINE 100 UNIT/ML ~~LOC~~ SOLN
8.0000 [IU] | Freq: Every morning | SUBCUTANEOUS | Status: DC
  Administered 2015-04-01 – 2015-04-06 (×6): 8 [IU] via SUBCUTANEOUS
  Filled 2015-03-31 (×7): qty 0.08

## 2015-03-31 MED ORDER — INSULIN ASPART 100 UNIT/ML ~~LOC~~ SOLN: 0.0000 [IU] | Freq: Three times a day (TID) | SUBCUTANEOUS | Status: DC

## 2015-03-31 MED ORDER — SODIUM CHLORIDE 0.9 % IV SOLN
INTRAVENOUS | Status: DC
  Administered 2015-03-31 (×2): via INTRAVENOUS
  Administered 2015-04-02: 50 mL/h via INTRAVENOUS
  Administered 2015-04-03: 01:00:00 via INTRAVENOUS
  Administered 2015-04-04: 50 mL/h via INTRAVENOUS

## 2015-03-31 MED ORDER — HEPARIN SODIUM (PORCINE) 5000 UNIT/ML IJ SOLN
5000.0000 [IU] | Freq: Three times a day (TID) | INTRAMUSCULAR | Status: DC
  Administered 2015-03-31 – 2015-04-11 (×32): 5000 [IU] via SUBCUTANEOUS
  Filled 2015-03-31 (×36): qty 1

## 2015-03-31 MED ORDER — BUDESONIDE 0.25 MG/2ML IN SUSP
0.2500 mg | Freq: Two times a day (BID) | RESPIRATORY_TRACT | Status: DC
  Administered 2015-03-31 – 2015-04-01 (×2): 0.25 mg via RESPIRATORY_TRACT
  Filled 2015-03-31 (×4): qty 2

## 2015-03-31 MED ORDER — DEXTROSE 5 % IV SOLN: 2.0000 g | Freq: Three times a day (TID) | INTRAVENOUS | Status: DC

## 2015-03-31 MED ORDER — PIPERACILLIN-TAZOBACTAM 3.375 G IVPB 30 MIN
3.3750 g | Freq: Once | INTRAVENOUS | Status: AC
  Administered 2015-03-31: 3.375 g via INTRAVENOUS
  Filled 2015-03-31: qty 50

## 2015-03-31 MED ORDER — SODIUM CHLORIDE 0.9 % IV BOLUS (SEPSIS)
500.0000 mL | Freq: Once | INTRAVENOUS | Status: AC
  Administered 2015-03-31: 500 mL via INTRAVENOUS

## 2015-03-31 MED ORDER — HYDROCORTISONE SOD SUCCINATE 100 MG IJ SOLR
50.0000 mg | Freq: Three times a day (TID) | INTRAMUSCULAR | Status: DC
  Administered 2015-03-31 – 2015-04-01 (×3): 50 mg via INTRAVENOUS
  Filled 2015-03-31 (×6): qty 1

## 2015-03-31 MED ORDER — VANCOMYCIN HCL 10 G IV SOLR
1250.0000 mg | INTRAVENOUS | Status: DC
  Administered 2015-04-01: 1250 mg via INTRAVENOUS
  Filled 2015-03-31: qty 1250

## 2015-03-31 MED ORDER — PANTOPRAZOLE SODIUM 40 MG IV SOLR
40.0000 mg | INTRAVENOUS | Status: DC
  Administered 2015-03-31: 40 mg via INTRAVENOUS
  Filled 2015-03-31: qty 40

## 2015-03-31 MED ORDER — CEFTAZIDIME 2 G IJ SOLR
2.0000 g | Freq: Two times a day (BID) | INTRAMUSCULAR | Status: DC
  Administered 2015-03-31 – 2015-04-01 (×2): 2 g via INTRAVENOUS
  Filled 2015-03-31 (×3): qty 2

## 2015-03-31 MED ORDER — VANCOMYCIN HCL IN DEXTROSE 1-5 GM/200ML-% IV SOLN
1000.0000 mg | Freq: Once | INTRAVENOUS | Status: AC
  Administered 2015-03-31: 1000 mg via INTRAVENOUS
  Filled 2015-03-31: qty 200

## 2015-03-31 MED ORDER — INSULIN ASPART 100 UNIT/ML ~~LOC~~ SOLN
0.0000 [IU] | Freq: Four times a day (QID) | SUBCUTANEOUS | Status: DC
  Administered 2015-04-01: 1 [IU] via SUBCUTANEOUS
  Administered 2015-04-01: 2 [IU] via SUBCUTANEOUS

## 2015-03-31 MED ORDER — ACETAMINOPHEN 650 MG RE SUPP
650.0000 mg | Freq: Once | RECTAL | Status: AC
  Administered 2015-03-31: 650 mg via RECTAL
  Filled 2015-03-31: qty 1

## 2015-03-31 MED ORDER — SODIUM CHLORIDE 0.9 % IV BOLUS (SEPSIS)
500.0000 mL | INTRAVENOUS | Status: AC
  Administered 2015-03-31: 500 mL via INTRAVENOUS

## 2015-03-31 NOTE — Progress Notes (Signed)
RT instructed pt on the use of flutter valve. Pt able to demonstrate back proper technique.  RT gave pt sputum sample collection cup to be used if pt able to cough up a sample later.

## 2015-03-31 NOTE — Progress Notes (Signed)
Pt arrived from ED-VSS. Oriented to room and unit routine. Son at bedside. Notified CCMD and [place].  Will continue to monitor for changes.

## 2015-03-31 NOTE — Consult Note (Signed)
[facility] Consult: 1:57 PM 03/31/2015  LOS: 0 days    Referring Provider:  Dr Dyann Kief  Primary Care Physician:  [name], MD Primary Gastroenterologist:  Dr.Pyrtle.      Reason for Consultation:  Dysphagia, aspiration   HPI: Antonio Ray is a 81 y.o. male.  PMH ischemic CM, EF 35 to 40%, CAD, s/p ICD/pacemaker 2012.  Chronic left ventricular apical thrombus. Advanced COPD. IDDM. Stage 3 CKD.  Gallstone pancreatitis and lp chole (no ERCP) 2011  Dr [name] follows pt's dysphagia. Ba Swallow 10/2013: tertiary contractions, distal esoph narrowing without esophageal dilatation: r/o stricture vs achalasia. Pt declined EGD then.   12/2014 admission with COPD flare/suspected aspiration PNA.   Admit #2 02/09/2015 for right sided asp PNA.  Bedside swallow study: primary esophageal dysphagia. Failed trial of purees and small bites of solids: grimacing, globus sensation, mucous expectoration with these po's. Discharged on soft diet. 03/16/15 esophageal manometry: positve for achalasia. EGD/Botox injection recommended.  Admit #3 03/21/15: with malaise.  WBCs 19K, probable right lung scarring.  Empiric tx with Zosyn. Persistent dysphagia, globus addressed by Dr [name].  Plan was to allow likely recurrent asp PNA resolve and perform EGD/Botox on 9/8 (with Dr Hilarie Fredrickson).  Near discharge was tolerating mech soft/D3 diet with little problem. Discharged on Augmentin.    Now back in ED with likely recurrent aspiration. .  Son says pt doing great yesterday.  Woke up this AM with clear cough. Pt had egg, grits, sausage, cereal for breakfast. Later on developed chills, rigors, weakness.  Initially in ED fever to 101.2, tachypneic, pulse in130s, but BP 140s/70s.  Elevated lactic acid.  Is receiving his 3rd liter of IVF and lactate  improved, pulse in 80s but BP now 80s/40s.   CXR with right basilar PNA.  WBCs 18.4 (11.9 on 8/22, 7 something yesterday at his [facility] PMD office).  Renal function acutally improved.  Pt says he has not had any events of acute dysphagia but suffers from globus sensation with all pos: liquids and solids.    Past Medical History  Diagnosis Date  . GERD (gastroesophageal reflux disease)   . Coronary artery disease     History of remote anterior MI with PCI to LAD in 2006; most recent cath 2007, no intervention required  . Chronic systolic dysfunction of left ventricle     EF 30-35%, CLASS II - III SYMPTOMS; intolerant to Coreg  . Hyperlipidemia   . PVC's (premature ventricular contractions)   . BPH (benign prostatic hyperplasia)   . CHF (congestive heart failure)   . COPD (chronic obstructive pulmonary disease)   . Heart murmur     hx  . Pneumonia "several times"    aspiration pna with at least 3 admits for this 2016.   . Type II diabetes mellitus   . CKD (chronic kidney disease) stage 3, GFR 30-59 ml/min 06/08/2012  . Skin cancer "several"    "forearms; head"  . Memory loss   . Anginal pain   . Dysrhythmia   . Myocardial infarction 1994  ANTERIOR  . AICD (automatic cardioverter/defibrillator) present 03/30/2011    Analyze ST study patient  . DOE (dyspnea on exertion)     with heavy exertion    Past Surgical History  Procedure Laterality Date  . Foot fracture surgery Right 1980's  . Cholecystectomy  2/11  . Cataract extraction w/ intraocular lens  implant, bilateral  08/2014-09/2014  . Cardiac catheterization  01/11/2006    DEMONSTRATES AKINESIA OF THE DISTAL ANTERIOR WALL, DISTAL INFERIOR WALL AND AKINESIA OF THE APEX. THE BASAL SEGMENTS CONTRACT WELL AND OVERALL EF 35%  . Coronary angioplasty  1994    TO THE LAD  . Skin cancer excision  "several"    "forearms, head"  . Esophageal manometry N/A 03/16/2015    Procedure: ESOPHAGEAL MANOMETRY (EM);  Surgeon: Jerene Bears, MD;   Location: [facility];  Service: Gastroenterology;  Laterality: N/A;  . Insert / replace / remove pacemaker      Prior to Admission medications   Medication Sig Start Date End Date Taking? Authorizing Provider  aspirin 81 MG EC tablet Take 81 mg by mouth daily.      Historical Provider, MD  budesonide-formoterol (SYMBICORT) 160-4.5 MCG/ACT inhaler Inhale 2 puffs into the lungs 2 (two) times daily. 09/19/14   Juanito Doom, MD  Cholecalciferol (VITAMIN D PO) Take 2,000 Units by mouth daily.     Historical Provider, MD  donepezil (ARICEPT) 10 MG tablet Take 10 mg by mouth every morning.    Historical Provider, MD  fenofibrate (TRICOR) 145 MG tablet TAKE 1 TABLET BY MOUTH EVERY DAY 01/22/15   Peter M Martinique, MD  furosemide (LASIX) 20 MG tablet TAKE 1 TABLET BY MOUTH EVERY DAY 09/19/14   Peter M Martinique, MD  insulin glargine (LANTUS) 100 UNIT/ML injection Inject 24 Units into the skin every morning.     Historical Provider, MD  losartan (COZAAR) 25 MG tablet Take 1 tablet (25 mg total) by mouth daily. 03/27/15   Thurnell Lose, MD  memantine (NAMENDA) 10 MG tablet Take 1 tablet (10 mg total) by mouth 2 (two) times daily. 01/19/15   Marcial Pacas, MD  Multiple Vitamins-Minerals (PRESERVISION AREDS 2) CAPS Take 1 capsule by mouth 2 (two) times daily.    Historical Provider, MD  nitroGLYCERIN (NITRODUR - DOSED IN MG/24 HR) 0.2 mg/hr patch Place 0.2 mg onto the skin daily.    Historical Provider, MD  pantoprazole (PROTONIX) 20 MG tablet TAKE 1 TABLET BY MOUTH EVERY DAY 07/18/14   Inda Castle, MD  pravastatin (PRAVACHOL) 40 MG tablet TAKE 1 TABLET BY MOUTH EVERY EVENING 02/20/15   Peter M Martinique, MD  PROVENTIL HFA 108 (90 BASE) MCG/ACT inhaler Inhale 2 puffs into the lungs every 6 (six) hours as needed for wheezing or shortness of breath. As directed 05/24/13   Historical Provider, MD  tiotropium (SPIRIVA) 18 MCG inhalation capsule Place 1 capsule (18 mcg total) into inhaler and inhale daily. 03/12/15    Juanito Doom, MD    Scheduled Meds:  Infusions:  PRN Meds:    Allergies as of 03/31/2015 - Review Complete 03/31/2015  Allergen Reaction Noted  . Codeine Other (See Comments)   . Dilaudid [hydromorphone hcl] Other (See Comments) 09/10/2013  . Flomax [tamsulosin hcl] Shortness Of Breath 05/19/2012  . Morphine and related Other (See Comments) 05/20/2011  . Sulfa antibiotics Shortness Of Breath 05/23/2012  . Beta adrenergic blockers  03/31/2015  . Carvedilol Other (See Comments) 04/18/2011  . Levofloxacin Other (See Comments) 05/20/2011  Family History  Problem Relation Age of Onset  . Stroke Father     Mother history unknown - never met her    Social History   Social History  . Marital Status: Widowed    Spouse Name: N/A  . Number of Children: 2  . Years of Education: GED   Occupational History  . 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 Smoking status: Former Smoker -- 1.00 packs/day for 35 years    Types: Cigarettes    Quit date: 08/01/1992  . Smokeless tobacco: Never Used  . Alcohol Use: 0.0 oz/week    0 Standard drinks or equivalent per week     Comment: Approximately a beer every 2 years  . Drug Use: No  . Sexual Activity: Not Currently   Other Topics Concern  . Not on file   Social History Narrative   Pt lives in [place] alone.  Widowed.   Right-handed.   Rare caffeine use - maybe one cup per month.    REVIEW OF SYSTEMS: Constitutional:  Weakness.  prviously would walk regularly in gym, but with recent admissions, has lost stamina and is not exercising.  ENT:  No nose bleeds Pulm:  No acute dyspnea.  No oxygen at home.  CV:  No palpitations, no LE edema.  GU:  No hematuria, no frequency GI:  Per HPI.  Last BM was yesterday Heme:  No issues with excessive bleeding or bruising   Transfusions:  None.  Neuro:  No headaches, no peripheral tingling or  numbness Derm:  No itching, no rash or sores.  Endocrine:  No sweats or chills.  No polyuria or dysuria Immunization:  Not queried.  Travel:  None beyond local counties in last few months.    PHYSICAL EXAM: Vital signs in last 24 hours: Filed Vitals:   03/31/15 1342  BP: 83/44  Pulse: 76  Temp: 99.6 F (37.6 C)  Resp: 25   Wt Readings from Last 3 Encounters:  03/31/15 184 lb 4.9 oz (83.6 kg)  03/26/15 184 lb 3.2 oz (83.553 kg)  03/24/15 183 lb (83.008 kg)    General: moderately frail, alert, elderly WM.  Comfortable. Head:  No asymmetry or swelling.   Eyes:  No icterus or pallor Ears:  HOH  Nose:  No congestion or discharge.  Mouth:  Clear bil.  Moist MM.  Neck:  No mass or JVD Lungs:  Greatly diminished bil in all fieldswithout wheezes/rales/ronchi.  Heart: RRR.  ICD/pacer on upper left chest Abdomen:  Soft, +reducible ventral hernia.  NT.  BS not heard.  No mass or HSM.   Rectal: not performed   Musc/Skeltl: no joint swelling.  Some arthritic deformity in knees.  Some kyphosis.  Extremities:  No CCE  Neurologic:  Oriented x 3.  No limb weakness or tremor.  Moves all 4s.  Skin:  No telangectasia, rash or sores Tattoos:  none Nodes:  No cervical adenopathy.    Psych:  Pleasant, calm.  Appropriate.   Intake/Output from previous day:   Intake/Output this shift:    LAB RESULTS:  Recent Labs  03/31/15 1100  WBC 18.4*  HGB 13.0  HCT 43.1  PLT 257   BMET Lab Results  Component Value Date   NA 142 03/31/2015   NA 140 03/23/2015   NA 141 03/22/2015   K 4.2 03/31/2015   K 4.4 03/23/2015  K 4.2 03/22/2015   CL 104 03/31/2015   CL 102 03/23/2015   CL 106 03/22/2015   CO2 28 03/31/2015   CO2 31 03/23/2015   CO2 25 03/22/2015   GLUCOSE 181* 03/31/2015   GLUCOSE 128* 03/23/2015   GLUCOSE 189* 03/22/2015   BUN 30* 03/31/2015   BUN 34* 03/23/2015   BUN 36* 03/22/2015   CREATININE 1.69* 03/31/2015   CREATININE 1.86* 03/23/2015   CREATININE 1.94*  03/22/2015   CALCIUM 9.5 03/31/2015   CALCIUM 8.7* 03/23/2015   CALCIUM 8.9 03/22/2015   LFT  Recent Labs  03/31/15 1100  PROT 7.3  ALBUMIN 3.3*  AST 33  ALT 26  ALKPHOS 45  BILITOT 0.6   PT/INR Lab Results  Component Value Date   INR 1.39 01/14/2015   INR 1.17 06/18/2011   INR 1.0 03/23/2011   Hepatitis Panel No results for input(s): HEPBSAG, HCVAB, HEPAIGM, HEPBIGM in the last 72 hours. C-Diff No components found for: CDIFF Lipase     Component Value Date/Time   LIPASE 14 09/08/2009 0445    Drugs of Abuse  No results found for: LABOPIA, COCAINSCRNUR, LABBENZ, AMPHETMU, THCU, LABBARB   RADIOLOGY STUDIES: Dg Chest Port 1 View  03/31/2015   CLINICAL DATA:  Shortness of breath, wheeze and cough for 2 days.  EXAM: PORTABLE CHEST - 1 VIEW  COMPARISON:  03/22/2015  FINDINGS: Chronic cardiomegaly with stable and negative aortic and hilar contours. Dual-chamber pacer leads from the left are in stable position.  Patchy airspace disease at the right base, new. No cavitation or effusion. Background hyperinflation.  IMPRESSION: Right basilar pneumonia.   Electronically Signed   By: Monte Fantasia M.D.   On: 03/31/2015 11:12    ENDOSCOPIC STUDIES: No EGD or colonoscopy in past.   IMPRESSION:   *  Achalasia, dysphagia  *  Aspiration PNA, recurrent with sepsis picture.   *  Ischemic CM.  S/p ICD/pacemaker  *  IDDM.     PLAN:     *  Will set up tentatively for EGD/Botox on 9/2 which is day when MAC is available, but only pursue study if pt improved and stable. *  Ok to have full liquids. *  Vanc and Zosyn for now. CBC in AM.      Azucena Freed  03/31/2015, 1:57 PM Pager: [phone]     ________________________________________________________________________  [facility] MD note:  I personally examined the patient, reviewed the data and agree with the assessment and plan described above.  Ideally we prefer that endoscopic procedures be done after acute illness  such as pneumonia has been given plenty of time to resolve and the patient has fully recovered.  It does not seem like that will be possible here.  He continues to have aspiration, now in with likely asp pneumonia just 7-10 days after last episode. Will plan for EGD with botox when he is more stable (afebrile, preferably not requiring oxygen).  Hopefully that will be in next 2-3 days.   Owens Loffler, MD [facility] Pager [phone]

## 2015-03-31 NOTE — H&P (Signed)
[facility] History and Physical  Antonio Ray TWK:[id number] DOB: 02-14-1934 DOA: 03/31/2015  Referring physician: Dr. Tamera Punt PCP: [name], MD   Chief Complaint: SOB, fever and increase coughing spells.  HPI: Antonio Ray is a 81 y.o. male with a past medical history of chronic systolic congestive heart failure having ejection fraction 35-40%, chronic kidney disease, established history of coronary artery disease; who was recently admitted and discharge on 8/23 due to aspiration PNA and achalasia; who returned today to ED secondary to  increasing cough and shortness of breath. Patient has RLL PNA on CXR and also WBc's 18.1, temp 101.2, HR 124, RR 39; lactic acid 2.4. Which appears to be secondary to aspiration again. Patient would be admitted due to sepsis 2/2 PNA (HCAP vs aspiration). Of note, patient denies N/V, CP, palpitations, focal weakness, dysuria, hematemesis and melena.   Review of Systems:  Negative except as mentioned on HPI  Past Medical History  Diagnosis Date  . GERD (gastroesophageal reflux disease)   . Coronary artery disease     History of remote anterior MI with PCI to LAD in 2006; most recent cath 2007, no intervention required  . Chronic systolic dysfunction of left ventricle     EF 30-35%, CLASS II - III SYMPTOMS; intolerant to Coreg  . Hyperlipidemia   . PVC's (premature ventricular contractions)   . BPH (benign prostatic hyperplasia)   . CHF (congestive heart failure)   . COPD (chronic obstructive pulmonary disease)   . Heart murmur     hx  . Pneumonia "several times"    aspiration pna with at least 3 admits for this 2016.   . Type II diabetes mellitus   . CKD (chronic kidney disease) stage 3, GFR 30-59 ml/min 06/08/2012  . Skin cancer "several"    "forearms; head"  . Memory loss   . Anginal pain   . Dysrhythmia   . Myocardial infarction 1994    ANTERIOR  . AICD (automatic cardioverter/defibrillator) present 03/30/2011    Analyze ST  study patient  . DOE (dyspnea on exertion)     with heavy exertion   Past Surgical History  Procedure Laterality Date  . Foot fracture surgery Right 1980's  . Cholecystectomy  2/11  . Cataract extraction w/ intraocular lens  implant, bilateral  08/2014-09/2014  . Cardiac catheterization  01/11/2006    DEMONSTRATES AKINESIA OF THE DISTAL ANTERIOR WALL, DISTAL INFERIOR WALL AND AKINESIA OF THE APEX. THE BASAL SEGMENTS CONTRACT WELL AND OVERALL EF 35%  . Coronary angioplasty  1994    TO THE LAD  . Skin cancer excision  "several"    "forearms, head"  . Esophageal manometry N/A 03/16/2015    Procedure: ESOPHAGEAL MANOMETRY (EM);  Surgeon: Jerene Bears, MD;  Location: [facility];  Service: Gastroenterology;  Laterality: N/A;  . Insert / replace / remove pacemaker     Social History:  reports that he quit smoking about 22 years ago. His smoking use included Cigarettes. He has a 35 pack-year smoking history. He has never used smokeless tobacco. He reports that he drinks alcohol. He reports that he does not use illicit drugs.  Allergies  Allergen Reactions  . Codeine Other (See Comments)    Gets very angry, disoriented  . Dilaudid [Hydromorphone Hcl] Other (See Comments)    VERY AGITATED, HOSTILE  . Flomax [Tamsulosin Hcl] Shortness Of Breath  . Morphine And Related Other (See Comments)    VERY AGITATED, HOSTILE  . Sulfa Antibiotics Shortness Of Breath  .  Beta Adrenergic Blockers     ? disorientation  . Carvedilol Other (See Comments)    DISORIENTATION  . Levofloxacin Other (See Comments)    unknown    Family History  Problem Relation Age of Onset  . Stroke Father     Mother history unknown - never met her    Prior to Admission medications   Medication Sig Start Date End Date Taking? Authorizing Provider  aspirin 81 MG EC tablet Take 81 mg by mouth daily.     Yes Historical Provider, MD  budesonide-formoterol (SYMBICORT) 160-4.5 MCG/ACT inhaler Inhale 2 puffs into the lungs 2  (two) times daily. 09/19/14  Yes Juanito Doom, MD  Cholecalciferol (VITAMIN D PO) Take 2,000 Units by mouth daily.    Yes Historical Provider, MD  donepezil (ARICEPT) 10 MG tablet Take 10 mg by mouth every morning.   Yes Historical Provider, MD  fenofibrate (TRICOR) 145 MG tablet TAKE 1 TABLET BY MOUTH EVERY DAY 01/22/15  Yes Peter M Martinique, MD  furosemide (LASIX) 20 MG tablet TAKE 1 TABLET BY MOUTH EVERY DAY 09/19/14  Yes Peter M Martinique, MD  insulin glargine (LANTUS) 100 UNIT/ML injection Inject 24 Units into the skin every morning.    Yes Historical Provider, MD  losartan (COZAAR) 25 MG tablet Take 1 tablet (25 mg total) by mouth daily. 03/27/15  Yes Thurnell Lose, MD  memantine (NAMENDA) 10 MG tablet Take 1 tablet (10 mg total) by mouth 2 (two) times daily. 01/19/15  Yes Marcial Pacas, MD  Multiple Vitamins-Minerals (PRESERVISION AREDS 2) CAPS Take 1 capsule by mouth 2 (two) times daily.   Yes Historical Provider, MD  nitroGLYCERIN (NITRODUR - DOSED IN MG/24 HR) 0.2 mg/hr patch Place 0.2 mg onto the skin daily.   Yes Historical Provider, MD  pantoprazole (PROTONIX) 20 MG tablet TAKE 1 TABLET BY MOUTH EVERY DAY 07/18/14  Yes Inda Castle, MD  pravastatin (PRAVACHOL) 40 MG tablet TAKE 1 TABLET BY MOUTH EVERY EVENING 02/20/15  Yes Peter M Martinique, MD  tiotropium (SPIRIVA) 18 MCG inhalation capsule Place 1 capsule (18 mcg total) into inhaler and inhale daily. 03/12/15  Yes Juanito Doom, MD  PROVENTIL HFA 108 (90 BASE) MCG/ACT inhaler Inhale 2 puffs into the lungs every 6 (six) hours as needed for wheezing or shortness of breath. As directed 05/24/13   Historical Provider, MD   Physical Exam: Filed Vitals:   03/31/15 1415 03/31/15 1430 03/31/15 1445 03/31/15 1503  BP: 83/44 82/37 82/41  90/40  Pulse: 78 71 70   Temp:      TempSrc:      Resp: 25 23 27    Height:      Weight:      SpO2: 93% 94% 94%     Wt Readings from Last 3 Encounters:  03/31/15 83.6 kg (184 lb 4.9 oz)  03/26/15 83.553  kg (184 lb 3.2 oz)  03/24/15 83.008 kg (183 lb)    General:  Appears in mid distress, due to SOB. He denies CP and is oriented X3. Patient is warm to touch and experienced multiple episodes of coughing spells during interview  Eyes: PERRL, normal lids, irises & conjunctiva, no icterus, no nystagmus ENT: grossly normal hearing, dry MM, no erythema, no exudates, no thrush. Patient w/o ears or nostrils drainage Neck: no LAD, masses or thyromegaly, no JVD Cardiovascular: RR, no rubs or gallops, no murmurs; S1 and S2 Respiratory: decreased air movement and positive rhonchi (R >L); no wheezing. No use of accessory  muscles  Abdomen: soft, ND, positive BS, no guarding and no tenderness appreciated on exam Skin: no rash or induration seen on limited exam Musculoskeletal: grossly normal tone BUE/BLE Psychiatric: grossly normal mood and affect, speech fluent and appropriate Neurologic: grossly non-focal.          Labs on Admission:  Basic Metabolic Panel:  Recent Labs Lab 03/31/15 1100  NA 142  K 4.2  CL 104  CO2 28  GLUCOSE 181*  BUN 30*  CREATININE 1.69*  CALCIUM 9.5   Liver Function Tests:  Recent Labs Lab 03/31/15 1100  AST 33  ALT 26  ALKPHOS 45  BILITOT 0.6  PROT 7.3  ALBUMIN 3.3*   CBC:  Recent Labs Lab 03/31/15 1100  WBC 18.4*  NEUTROABS 16.7*  HGB 13.0  HCT 43.1  MCV 88.1  PLT 257   BNP (last 3 results)  Recent Labs  01/13/15 2059 02/07/15 1300 03/21/15 1055  BNP 176.5* 131.2* 100.9*   CBG:  Recent Labs Lab 03/24/15 1613  GLUCAP 221*    Radiological Exams on Admission: Dg Chest Port 1 View  03/31/2015   CLINICAL DATA:  Shortness of breath, wheeze and cough for 2 days.  EXAM: PORTABLE CHEST - 1 VIEW  COMPARISON:  03/22/2015  FINDINGS: Chronic cardiomegaly with stable and negative aortic and hilar contours. Dual-chamber pacer leads from the left are in stable position.  Patchy airspace disease at the right base, new. No cavitation or effusion.  Background hyperinflation.  IMPRESSION: Right basilar pneumonia.   Electronically Signed   By: Monte Fantasia M.D.   On: 03/31/2015 11:12    EKG:  Sinus tachycardia, some poor R wave progression; no acute ischemic changes   Assessment/Plan 1-Sepsis due to HCAP/aspiration PNA: WBc's 18.1, temp 101.2, HR 124, RR 39; lactic acid 2.4 -will admit to stepdown -will cover with vancomycin and Tressie Ellis -will follow sepsis protocol -after initial IVF's his lactic acid improved to 1.24 -will hold antihypertensive agents and diuretics -nebulizer treatment and GI consulted with assistance of EGD/further treatment for achalasia  -will check cortisol and use some solucortef  2-Diabetes mellitus type II, non insulin dependent: will use lantus 8 units and SSI -patient NPO for now, so will check CBG's q6h  3-COPD (chronic obstructive pulmonary disease): no wheezing on exam -decrease air movement mainly on right side and positive rhonchi  -most likely due to PNA -will continue nebulizer and also use some solucortef due to low BP and sepsis  4-CKD (chronic kidney disease) stage 3, GFR 30-59 ml/min: stable and at baseline -will continue monitoring renal function -holding diuretics now  5-Leukocytosis: due to sepsis -will treat with broad spectrum antibiotics and will follow WBC's trend  6-HCAP (healthcare-associated pneumonia):  7-Achalasia: per GI -NPO for now as he was struggling with his own saliva   GI ([facility])  Code Status: Full DVT Prophylaxis:heparin Family Communication: son at bedside Disposition Plan: LOS > 2 midnights, inpatient, stepdown.  Time spent: 70 minutes  Barton Dubois [facility] Pager [phone]

## 2015-03-31 NOTE — ED Notes (Signed)
Pt's BP continues to trend down, pt is asymptomatic with strong radial pulse. Dr Dyann Kief updated, 500 ml NS bolus ordered and given, will monitor.

## 2015-03-31 NOTE — ED Notes (Signed)
From home via [facility] for SOB, cough, sats 80s, recent pmn admit, VSS, no pain, NAD

## 2015-03-31 NOTE — Progress Notes (Addendum)
ANTIBIOTIC CONSULT NOTE - INITIAL  Pharmacy Consult for Vancomycin and Zosyn Indication: rule out sepsis and recurrent pneumonia/HCAP  Allergies  Allergen Reactions  . Codeine Other (See Comments)    Gets very angry, disoriented  . Dilaudid [Hydromorphone Hcl] Other (See Comments)    VERY AGITATED, HOSTILE  . Flomax [Tamsulosin Hcl] Shortness Of Breath  . Morphine And Related Other (See Comments)    VERY AGITATED, HOSTILE  . Sulfa Antibiotics Shortness Of Breath  . Carvedilol Other (See Comments)    DISORIENTATION  . Levofloxacin Other (See Comments)    unknown    Patient Measurements: Height: 5\' 10"  (177.8 cm) Weight: 184 lb 4.9 oz (83.6 kg) IBW/kg (Calculated) : 73  Vital Signs: Temp: 101.2 F (38.4 C) (08/30 1046) Temp Source: Oral (08/30 1046) BP: 99/48 mmHg (08/30 1245) Pulse Rate: 83 (08/30 1245)  Labs:  Recent Labs  03/31/15 1100  WBC 18.4*  HGB 13.0  PLT 257  CREATININE 1.69*   Estimated Creatinine Clearance: 36.6 mL/min (by C-G formula based on Cr of 1.69).  Microbiology: Recent Results (from the past 720 hour(s))  Blood culture (routine x 2)     Status: None   Collection Time: 03/21/15 10:45 AM  Result Value Ref Range Status   Specimen Description BLOOD RIGHT ANTECUBITAL  Final   Special Requests BOTTLES DRAWN AEROBIC AND ANAEROBIC 10ML  Final   Culture NO GROWTH 5 DAYS  Final   Report Status 03/26/2015 FINAL  Final  Blood culture (routine x 2)     Status: None   Collection Time: 03/21/15 11:00 AM  Result Value Ref Range Status   Specimen Description BLOOD LEFT ANTECUBITAL  Final   Special Requests BOTTLES DRAWN AEROBIC AND ANAEROBIC 10ML  Final   Culture NO GROWTH 5 DAYS  Final   Report Status 03/26/2015 FINAL  Final  Blood Culture (routine x 2)     Status: None (Preliminary result)   Collection Time: 03/31/15 11:00 AM  Result Value Ref Range Status   Specimen Description BLOOD LEFT ANTECUBITAL  Final   Special Requests BOTTLES DRAWN AEROBIC  AND ANAEROBIC 5ML  Final   Culture PENDING  Incomplete   Report Status PENDING  Incomplete  Blood Culture (routine x 2)     Status: None (Preliminary result)   Collection Time: 03/31/15 11:17 AM  Result Value Ref Range Status   Specimen Description BLOOD LEFT ARM  Final   Special Requests BOTTLES DRAWN AEROBIC AND ANAEROBIC 5ML  Final   Culture PENDING  Incomplete   Report Status PENDING  Incomplete    Medical History: Past Medical History  Diagnosis Date  . GERD (gastroesophageal reflux disease)   . Coronary artery disease     History of remote anterior MI with PCI to LAD in 2006; most recent cath 2007, no intervention required  . Chronic systolic dysfunction of left ventricle     EF 30-35%, CLASS II - III SYMPTOMS; intolerant to Coreg  . Hyperlipidemia   . PVC's (premature ventricular contractions)   . Intolerance, drug     Intolerant to beta blockers  . BPH (benign prostatic hyperplasia)   . CHF (congestive heart failure)   . COPD (chronic obstructive pulmonary disease)   . Heart murmur     hx  . Pneumonia "several times"  . Type II diabetes mellitus   . CRF (chronic renal failure)   . CKD (chronic kidney disease) stage 3, GFR 30-59 ml/min 06/08/2012  . Skin cancer "several"    "forearms;  head"  . Memory loss   . Anginal pain   . Dysrhythmia   . Myocardial infarction 1994    ANTERIOR  . AICD (automatic cardioverter/defibrillator) present 03/30/2011    Analyze ST study patient  . DOE (dyspnea on exertion)     with heavy exertion   Assessment:   81 yr old male to be admitted with recurrent aspiration pneumonia.  Was just in the hospital 8/20-23/2016. Received Zosyn 8/20>>8/23, transitioned to Augmentin on 8/23 for 4 more days as outpatient.   Vancomycin 1 gram IV and Zosyn 3.375 gm IV (over 30 minutes) given ~11:25am in ED, after cultures obtained. Temp to 101.2, WBC 18.4, lactic acid 2.43.  Goal of Therapy:  Vancomycin trough level 15-20 mcg/ml appropriate Zosyn  dose for renal function and infection  Plan:   Vancomycin 1250 mg IV q24hrs.  First dose in am.  Zosyn 3.375 gm IV q8hrs (each over 4 hours).  Follow renal function, culture data, progress.  Arty Baumgartner, RPh Pager: [phone] 03/31/2015,12:55 PM   2nd shift: changing to vancomycin and ceftazidime.    Plan - continue vancomycin 1250 mg iv q24h, 1st dose tom am - change ceftazidime to 2g iv q12h - monitor renal function

## 2015-03-31 NOTE — ED Provider Notes (Addendum)
CSN: [id number]     Arrival date & time 03/31/15  1034 History   First MD Initiated Contact with Patient 03/31/15 1043     Chief Complaint  Patient presents with  . Shortness of Breath     (Consider location/radiation/quality/duration/timing/severity/associated sxs/prior Treatment) HPI Comments: Patient is brought in by EMS for shortness of breath and cough. He has a history of achalasia with recurrent aspiration pneumonia. He also has a history of coronary artery disease, CHF with an EF of 35% and AICD placement, COPD and diabetes. Patient was last admitted on August 20 for pneumonia. His son reports that he was seemingly fine yesterday. He seemed a little bit off, fuzzy, and his son took him to his PCP who did some blood work. Today he had a rapid deterioration with increased coughing and shortness of breath. He's had some altered mental status today. He was found have a fever of 101 here in the emergency department. He was given nebulizer treatment by EMS due to initial hypoxia with an oxygen saturation in the 80s and wheezing with rhonchi. He's had some improvement after the nebulizer treatment. His son does not report any vomiting diarrhea or other recent illnesses.  Patient is a 81 y.o. male presenting with shortness of breath.  Shortness of Breath   Past Medical History  Diagnosis Date  . GERD (gastroesophageal reflux disease)   . Coronary artery disease     History of remote anterior MI with PCI to LAD in 2006; most recent cath 2007, no intervention required  . Chronic systolic dysfunction of left ventricle     EF 30-35%, CLASS II - III SYMPTOMS; intolerant to Coreg  . Hyperlipidemia   . PVC's (premature ventricular contractions)   . Intolerance, drug     Intolerant to beta blockers  . BPH (benign prostatic hyperplasia)   . CHF (congestive heart failure)   . COPD (chronic obstructive pulmonary disease)   . Heart murmur     hx  . Pneumonia "several times"  . Type II diabetes  mellitus   . CRF (chronic renal failure)   . CKD (chronic kidney disease) stage 3, GFR 30-59 ml/min 06/08/2012  . Skin cancer "several"    "forearms; head"  . Memory loss   . Anginal pain   . Dysrhythmia   . Myocardial infarction 1994    ANTERIOR  . AICD (automatic cardioverter/defibrillator) present 03/30/2011    Analyze ST study patient  . DOE (dyspnea on exertion)     with heavy exertion   Past Surgical History  Procedure Laterality Date  . Foot fracture surgery Right 1980's  . Cholecystectomy  2/11  . Cataract extraction w/ intraocular lens  implant, bilateral  08/2014-09/2014  . Cardiac catheterization  01/11/2006    DEMONSTRATES AKINESIA OF THE DISTAL ANTERIOR WALL, DISTAL INFERIOR WALL AND AKINESIA OF THE APEX. THE BASAL SEGMENTS CONTRACT WELL AND OVERALL EF 35%  . Coronary angioplasty  1994    TO THE LAD  . Skin cancer excision  "several"    "forearms, head"  . Esophageal manometry N/A 03/16/2015    Procedure: ESOPHAGEAL MANOMETRY (EM);  Surgeon: Jerene Bears, MD;  Location: [facility];  Service: Gastroenterology;  Laterality: N/A;  . Insert / replace / remove pacemaker     Family History  Problem Relation Age of Onset  . Stroke Father     Mother history unknown - never met her   Social History  Substance Use Topics  . Smoking status: Former Smoker --  1.00 packs/day for 35 years    Types: Cigarettes    Quit date: 08/01/1992  . Smokeless tobacco: Never Used  . Alcohol Use: 0.0 oz/week    0 Standard drinks or equivalent per week     Comment: Approximately a beer every 2 years    Review of Systems  Unable to perform ROS: Mental status change  Respiratory: Positive for shortness of breath.       Allergies  Codeine; Dilaudid; Flomax; Morphine and related; Sulfa antibiotics; Carvedilol; and Levofloxacin  Home Medications   Prior to Admission medications   Medication Sig Start Date End Date Taking? Authorizing Provider  aspirin 81 MG EC tablet Take 81 mg by  mouth daily.      Historical Provider, MD  budesonide-formoterol (SYMBICORT) 160-4.5 MCG/ACT inhaler Inhale 2 puffs into the lungs 2 (two) times daily. 09/19/14   Juanito Doom, MD  Cholecalciferol (VITAMIN D PO) Take 2,000 Units by mouth daily.     Historical Provider, MD  donepezil (ARICEPT) 10 MG tablet Take 10 mg by mouth every morning.    Historical Provider, MD  fenofibrate (TRICOR) 145 MG tablet TAKE 1 TABLET BY MOUTH EVERY DAY 01/22/15   Peter M Martinique, MD  furosemide (LASIX) 20 MG tablet TAKE 1 TABLET BY MOUTH EVERY DAY 09/19/14   Peter M Martinique, MD  insulin glargine (LANTUS) 100 UNIT/ML injection Inject 24 Units into the skin every morning.     Historical Provider, MD  losartan (COZAAR) 25 MG tablet Take 1 tablet (25 mg total) by mouth daily. 03/27/15   Thurnell Lose, MD  memantine (NAMENDA) 10 MG tablet Take 1 tablet (10 mg total) by mouth 2 (two) times daily. 01/19/15   Marcial Pacas, MD  Multiple Vitamins-Minerals (PRESERVISION AREDS 2) CAPS Take 1 capsule by mouth 2 (two) times daily.    Historical Provider, MD  nitroGLYCERIN (NITRODUR - DOSED IN MG/24 HR) 0.2 mg/hr patch Place 0.2 mg onto the skin daily.    Historical Provider, MD  pantoprazole (PROTONIX) 20 MG tablet TAKE 1 TABLET BY MOUTH EVERY DAY 07/18/14   Inda Castle, MD  pravastatin (PRAVACHOL) 40 MG tablet TAKE 1 TABLET BY MOUTH EVERY EVENING 02/20/15   Peter M Martinique, MD  PROVENTIL HFA 108 (90 BASE) MCG/ACT inhaler Inhale 2 puffs into the lungs every 6 (six) hours as needed for wheezing or shortness of breath. As directed 05/24/13   Historical Provider, MD  tiotropium (SPIRIVA) 18 MCG inhalation capsule Place 1 capsule (18 mcg total) into inhaler and inhale daily. 03/12/15   Juanito Doom, MD   BP 111/57 mmHg  Pulse 98  Temp(Src) 101.2 F (38.4 C) (Oral)  Resp 39  SpO2 93% Physical Exam  Constitutional: He appears well-developed and well-nourished. He appears distressed.  HENT:  Head: Normocephalic and atraumatic.   Eyes: Pupils are equal, round, and reactive to light.  Neck: Normal range of motion. Neck supple.  Cardiovascular: Normal rate, regular rhythm and normal heart sounds.   Pulmonary/Chest: Effort normal. No respiratory distress. He has wheezes. He has no rales. He exhibits no tenderness.  Rhonchorous  Abdominal: Soft. Bowel sounds are normal. There is no tenderness. There is no rebound and no guarding.  Musculoskeletal: Normal range of motion. He exhibits no edema.  Lymphadenopathy:    He has no cervical adenopathy.  Neurological: He is alert.  Patient is awake and alert but confused. He's moving ostomy symmetrically  Skin: Skin is warm and dry. No rash noted.  Psychiatric:  He has a normal mood and affect.    ED Course  Procedures (including critical care time) Labs Review Labs Reviewed  COMPREHENSIVE METABOLIC PANEL - Abnormal; Notable for the following:    Glucose, Bld 181 (*)    BUN 30 (*)    Creatinine, Ser 1.69 (*)    Albumin 3.3 (*)    GFR calc non Af Amer 37 (*)    GFR calc Af Amer 43 (*)    All other components within normal limits  CBC WITH DIFFERENTIAL/PLATELET - Abnormal; Notable for the following:    WBC 18.4 (*)    Neutrophils Relative % 91 (*)    Neutro Abs 16.7 (*)    Lymphocytes Relative 6 (*)    All other components within normal limits  I-STAT CG4 LACTIC ACID, ED - Abnormal; Notable for the following:    Lactic Acid, Venous 2.43 (*)    All other components within normal limits  CULTURE, BLOOD (ROUTINE X 2)  CULTURE, BLOOD (ROUTINE X 2)  URINE CULTURE  URINALYSIS, ROUTINE W REFLEX MICROSCOPIC (NOT AT [facility])  Randolm Idol, ED    Imaging Review Dg Chest Port 1 View  03/31/2015   CLINICAL DATA:  Shortness of breath, wheeze and cough for 2 days.  EXAM: PORTABLE CHEST - 1 VIEW  COMPARISON:  03/22/2015  FINDINGS: Chronic cardiomegaly with stable and negative aortic and hilar contours. Dual-chamber pacer leads from the left are in stable position.  Patchy  airspace disease at the right base, new. No cavitation or effusion. Background hyperinflation.  IMPRESSION: Right basilar pneumonia.   Electronically Signed   By: Monte Fantasia M.D.   On: 03/31/2015 11:12   I have personally reviewed and evaluated these images and lab results as part of my medical decision-making.   EKG Interpretation   Date/Time:  Tuesday March 31 2015 11:13:38 EDT Ventricular Rate:  113 PR Interval:  164 QRS Duration: 128 QT Interval:  374 QTC Calculation: 513 R Axis:   -83 Text Interpretation:  Sinus tachycardia Nonspecific IVCD with LAD Inferior  infarct, acute (RCA) Probable anteroseptal infarct, old Lateral leads are  also involved Probable RV involvement, suggest recording right precordial  leads mild ST elevation inferiorly, laterally, slightly more prominent as  compared to prior EKG Confirmed by Caden Fatica  MD, Tian Mcmurtrey ([id number]) on 03/31/2015  11:30:21 AM      MDM   Final diagnoses:  HCAP (healthcare-associated pneumonia)  Sepsis, due to unspecified organism    Patient with a history of recurrent pneumonia, thought to be secondary to aspiration presents with respiratory distress and hypoxia. There is evidence of pneumonia on x-ray. He also meets criteria for sepsis. Sepsis order sets were initiated and patient was given broad-spectrum antibiotics for coverage for healthcare associated pneumonia. Flow boluses were initiated. His lactate is mildly elevated. His blood pressure has been stable. I will consult the hospitalist for admission. He still has some mild tachypnea but his oxygen saturations are in the mid 90s on a nasal cannula. He did have some slight EKG changes as compared to his prior EKGs however his troponin is negative and his clinical scenario is not consistent with a STEMI. A repeat EKG shows a sinus tachycardia with normalized ST segments.  Spoke with Dr. Dyann Kief who will admit  Sepsis - Repeat Assessment  Performed at:    12:17  Vitals     Blood  pressure 111/57, pulse 98, temperature 101.2 F (38.4 C), temperature source Oral, resp. rate 39, SpO2 93 %.  Heart:     Tachycardic  Lungs:    Rhonchi  Capillary Refill:   <2 sec  Peripheral Pulse:   Radial pulse palpable  Skin:     Normal Color   CRITICAL CARE Performed by: Chinelo Benn Total critical care time: 35 Critical care time was exclusive of separately billable procedures and treating other patients. Critical care was necessary to treat or prevent imminent or life-threatening deterioration. Critical care was time spent personally by me on the following activities: development of treatment plan with patient and/or surrogate as well as nursing, discussions with consultants, evaluation of patient's response to treatment, examination of patient, obtaining history from patient or surrogate, ordering and performing treatments and interventions, ordering and review of laboratory studies, ordering and review of radiographic studies, pulse oximetry and re-evaluation of patient's condition.       Malvin Johns, MD 03/31/15 1792  Malvin Johns, MD 03/31/15 Kathleen, MD 03/31/15 1232

## 2015-04-01 ENCOUNTER — Inpatient Hospital Stay (HOSPITAL_COMMUNITY): Payer: Medicare Other

## 2015-04-01 DIAGNOSIS — K220 Achalasia of cardia: Secondary | ICD-10-CM

## 2015-04-01 DIAGNOSIS — J189 Pneumonia, unspecified organism: Secondary | ICD-10-CM

## 2015-04-01 LAB — EXPECTORATED SPUTUM ASSESSMENT W REFEX TO RESP CULTURE

## 2015-04-01 LAB — GLUCOSE, CAPILLARY
GLUCOSE-CAPILLARY: 128 mg/dL — AB (ref 65–99)
GLUCOSE-CAPILLARY: 196 mg/dL — AB (ref 65–99)
Glucose-Capillary: 142 mg/dL — ABNORMAL HIGH (ref 65–99)
Glucose-Capillary: 181 mg/dL — ABNORMAL HIGH (ref 65–99)

## 2015-04-01 LAB — CBC
HEMATOCRIT: 37.4 % — AB (ref 39.0–52.0)
Hemoglobin: 11.4 g/dL — ABNORMAL LOW (ref 13.0–17.0)
MCH: 27 pg (ref 26.0–34.0)
MCHC: 30.5 g/dL (ref 30.0–36.0)
MCV: 88.6 fL (ref 78.0–100.0)
Platelets: 208 10*3/uL (ref 150–400)
RBC: 4.22 MIL/uL (ref 4.22–5.81)
RDW: 14.7 % (ref 11.5–15.5)
WBC: 21.5 10*3/uL — ABNORMAL HIGH (ref 4.0–10.5)

## 2015-04-01 LAB — URINE CULTURE: CULTURE: NO GROWTH

## 2015-04-01 LAB — BASIC METABOLIC PANEL
Anion gap: 5 (ref 5–15)
BUN: 24 mg/dL — AB (ref 6–20)
CALCIUM: 8.1 mg/dL — AB (ref 8.9–10.3)
CO2: 26 mmol/L (ref 22–32)
Chloride: 109 mmol/L (ref 101–111)
Creatinine, Ser: 1.37 mg/dL — ABNORMAL HIGH (ref 0.61–1.24)
GFR calc Af Amer: 55 mL/min — ABNORMAL LOW (ref >60)
GFR, EST NON AFRICAN AMERICAN: 47 mL/min — AB (ref >60)
GLUCOSE: 150 mg/dL — AB (ref 65–99)
POTASSIUM: 3.9 mmol/L (ref 3.5–5.1)
Sodium: 140 mmol/L (ref 135–145)

## 2015-04-01 MED ORDER — PRAVASTATIN SODIUM 40 MG PO TABS
40.0000 mg | ORAL_TABLET | Freq: Every evening | ORAL | Status: DC
  Administered 2015-04-01: 40 mg via ORAL
  Filled 2015-04-01 (×3): qty 1

## 2015-04-01 MED ORDER — DONEPEZIL HCL 10 MG PO TABS
10.0000 mg | ORAL_TABLET | Freq: Every morning | ORAL | Status: DC
  Administered 2015-04-01: 10 mg via ORAL
  Filled 2015-04-01 (×2): qty 1

## 2015-04-01 MED ORDER — IPRATROPIUM-ALBUTEROL 0.5-2.5 (3) MG/3ML IN SOLN
3.0000 mL | RESPIRATORY_TRACT | Status: DC | PRN
  Administered 2015-04-02 (×2): 3 mL via RESPIRATORY_TRACT
  Filled 2015-04-01 (×2): qty 3

## 2015-04-01 MED ORDER — INSULIN ASPART 100 UNIT/ML ~~LOC~~ SOLN
0.0000 [IU] | Freq: Three times a day (TID) | SUBCUTANEOUS | Status: DC
  Administered 2015-04-01: 2 [IU] via SUBCUTANEOUS

## 2015-04-01 MED ORDER — INSULIN ASPART 100 UNIT/ML ~~LOC~~ SOLN
0.0000 [IU] | Freq: Three times a day (TID) | SUBCUTANEOUS | Status: DC
  Administered 2015-04-01: 1 [IU] via SUBCUTANEOUS
  Administered 2015-04-02: 2 [IU] via SUBCUTANEOUS
  Administered 2015-04-02: 3 [IU] via SUBCUTANEOUS

## 2015-04-01 MED ORDER — TIOTROPIUM BROMIDE MONOHYDRATE 18 MCG IN CAPS
18.0000 ug | ORAL_CAPSULE | Freq: Every day | RESPIRATORY_TRACT | Status: DC
  Filled 2015-04-01: qty 5

## 2015-04-01 MED ORDER — BUDESONIDE-FORMOTEROL FUMARATE 160-4.5 MCG/ACT IN AERO
2.0000 | INHALATION_SPRAY | Freq: Two times a day (BID) | RESPIRATORY_TRACT | Status: DC
  Administered 2015-04-01: 2 via RESPIRATORY_TRACT
  Filled 2015-04-01: qty 6

## 2015-04-01 MED ORDER — MEMANTINE HCL 10 MG PO TABS
10.0000 mg | ORAL_TABLET | Freq: Two times a day (BID) | ORAL | Status: DC
  Administered 2015-04-01 (×2): 10 mg via ORAL
  Filled 2015-04-01 (×6): qty 1

## 2015-04-01 MED ORDER — PANTOPRAZOLE SODIUM 40 MG PO PACK
20.0000 mg | PACK | Freq: Every day | ORAL | Status: DC
  Administered 2015-04-01: 20 mg via ORAL
  Filled 2015-04-01 (×5): qty 20

## 2015-04-01 MED ORDER — IPRATROPIUM-ALBUTEROL 0.5-2.5 (3) MG/3ML IN SOLN: 3.0000 mL | Freq: Four times a day (QID) | RESPIRATORY_TRACT | Status: DC

## 2015-04-01 MED ORDER — PANTOPRAZOLE SODIUM 40 MG PO PACK: 40.0000 mg | PACK | Freq: Every day | ORAL | Status: DC

## 2015-04-01 MED ORDER — ASPIRIN EC 81 MG PO TBEC
81.0000 mg | DELAYED_RELEASE_TABLET | Freq: Every day | ORAL | Status: DC
  Administered 2015-04-01: 81 mg via ORAL
  Filled 2015-04-01 (×2): qty 1

## 2015-04-01 MED ORDER — LOSARTAN POTASSIUM 25 MG PO TABS
25.0000 mg | ORAL_TABLET | Freq: Every day | ORAL | Status: DC
  Administered 2015-04-01: 25 mg via ORAL
  Filled 2015-04-01 (×2): qty 1

## 2015-04-01 MED ORDER — SODIUM CHLORIDE 0.9 % IV SOLN
INTRAVENOUS | Status: DC
  Administered 2015-04-03: 01:00:00 via INTRAVENOUS

## 2015-04-01 MED ORDER — ACETAMINOPHEN 160 MG/5ML PO SOLN: 650.0000 mg | Freq: Four times a day (QID) | ORAL | Status: DC | PRN

## 2015-04-01 NOTE — Consult Note (Signed)
   [facility] CM Inpatient Consult   04/01/2015  JABREE REBERT 1934/03/13 [id number]    Referral received. Chart reviewed and patient evaluated for [facility] Care Management services. Patient is not eligible for [facility] Care Management services because unfortunately, patient's Medicare is not in the delegation for [place] Management services at this time. Will update inpatient care manager of outcome. For questions, please contact: Natividad Brood, RN BSN [place] Liaison  [phone] business mobile phone

## 2015-04-01 NOTE — Progress Notes (Signed)
[facility] TEAM 1 - Stepdown/ICU TEAM PROGRESS NOTE  Antonio Ray ZOX:[id number] DOB: 09/10/1933 DOA: 03/31/2015 PCP: [name], MD  Admit HPI / Brief Narrative: 81 y.o. male with a history of chronic systolic congestive heart failure having ejection fraction 35-40%, chronic kidney disease, and coronary artery disease who was discharge on 8/23 after tx for aspiration PNA and achalasia only to return to the ED 8/30 secondary to increasing cough and shortness of breath. In the ED the patient had RLL infiltrate on CXR and WBc's 18.1, temp 101.2, HR 124, RR 39; lactic acid 2.4.   HPI/Subjective: The patient has improved quite rapidly.  This morning is alert and conversant.  He denies any shortness of breath chest pain nausea or abdominal pain.  He does admit that he is not able to tolerate much oral intake without vomiting.  Assessment/Plan:  Sepsis due to RLL aspiration PNA/pneumonitis Sepsis physiology is improving rapidly - continue empiric antibiotic therapy but will plan for a shortened 5 day course as this likely represents pneumonitis rather than a true pneumonia   Achalasia GI planning for EGD/Botox when clinically stable  Chronic systolic congestive heart failure EF 30-35% - clinically volume appears stable at present though weight is up to 86.7 kg from recent discharge weight of 83 kg - follow I's/O's and daily weights  DM 2 CBG currently well-controlled  CAD PCI to LAD 2006 - follow-up cardiac cath 2007 with no intervention required  COPD No evidence of an acute bronchospastic exacerbation  CKD stage III Baseline creatinine appears to be approximately 1.6-1.9 - creatinine presently better than baseline  Code Status: FULL Family Communication: no family present at time of exam Disposition Plan: the patient is stable for transfer to a medical bed while he awaits EGD  Consultants: [facility]  Procedures: none  Antibiotics: Zosyn 8/30 > Vancomycin 8/30  DVT  prophylaxis: Subcutaneous heparin  Objective: Blood pressure 126/62, pulse 80, temperature 97.4 F (36.3 C), temperature source Oral, resp. rate 18, height 5\' 10"  (1.778 m), weight 86.7 kg (191 lb 2.2 oz), SpO2 96 %.   Intake/Output Summary (Last 24 hours) at 04/01/15 0926 Last data filed at 04/01/15 0600  Gross per 24 hour  Intake   4310 ml  Output   1325 ml  Net   2985 ml   Exam: General: No acute respiratory distress - alert and conversant  Lungs: right basilar crackles with good air movement throughout other fields and no wheeze  Cardiovascular: Regular rate and rhythm without murmur gallop or rub normal S1 and S2 Abdomen: Nontender, nondistended, soft, bowel sounds positive, no rebound, no ascites, no appreciable mass Extremities: No significant cyanosis, clubbing, or edema bilateral lower extremities  Data Reviewed: Basic Metabolic Panel:  Recent Labs Lab 03/31/15 1100 03/31/15 1937 04/01/15 0248  NA 142 140 140  K 4.2 4.4 3.9  CL 104 107 109  CO2 28 25 26   GLUCOSE 181* 149* 150*  BUN 30* 27* 24*  CREATININE 1.69* 1.53* 1.37*  CALCIUM 9.5 8.1* 8.1*    CBC:  Recent Labs Lab 03/31/15 1100 03/31/15 1937 04/01/15 0248  WBC 18.4* 25.7* 21.5*  NEUTROABS 16.7* 22.3*  --   HGB 13.0 11.5* 11.4*  HCT 43.1 38.1* 37.4*  MCV 88.1 87.8 88.6  PLT 257 222 208    Liver Function Tests:  Recent Labs Lab 03/31/15 1100 03/31/15 1937  AST 33 27  ALT 26 20  ALKPHOS 45 39  BILITOT 0.6 0.7  PROT 7.3 6.1*  ALBUMIN  3.3* 2.8*   Coags:  Recent Labs Lab 03/31/15 1937  INR 1.27    Recent Labs Lab 03/31/15 1937  APTT 30    CBG:  Recent Labs Lab 03/31/15 1648 03/31/15 2316 04/01/15 0556  GLUCAP 79 161* 128*    Recent Results (from the past 240 hour(s))  Blood Culture (routine x 2)     Status: None (Preliminary result)   Collection Time: 03/31/15 11:00 AM  Result Value Ref Range Status   Specimen Description BLOOD LEFT ANTECUBITAL  Final   Special  Requests BOTTLES DRAWN AEROBIC AND ANAEROBIC 5ML  Final   Culture PENDING  Incomplete   Report Status PENDING  Incomplete  Blood Culture (routine x 2)     Status: None (Preliminary result)   Collection Time: 03/31/15 11:17 AM  Result Value Ref Range Status   Specimen Description BLOOD LEFT ARM  Final   Special Requests BOTTLES DRAWN AEROBIC AND ANAEROBIC 5ML  Final   Culture PENDING  Incomplete   Report Status PENDING  Incomplete  MRSA PCR Screening     Status: None   Collection Time: 03/31/15  5:09 PM  Result Value Ref Range Status   MRSA by PCR NEGATIVE NEGATIVE Final    Comment:        The GeneXpert MRSA Assay (FDA approved for NASAL specimens only), is one component of a comprehensive MRSA colonization surveillance program. It is not intended to diagnose MRSA infection nor to guide or monitor treatment for MRSA infections.   Culture, sputum-assessment     Status: None   Collection Time: 04/01/15  6:15 AM  Result Value Ref Range Status   Specimen Description SPUTUM  Final   Special Requests NONE  Final   Sputum evaluation   Final    THIS SPECIMEN IS ACCEPTABLE. RESPIRATORY CULTURE REPORT TO FOLLOW.   Report Status 04/01/2015 FINAL  Final     Studies:   Recent x-ray studies have been reviewed in detail by the Attending Physician  Scheduled Meds:  Scheduled Meds: . budesonide (PULMICORT) nebulizer solution  0.25 mg Nebulization BID  . cefTAZidime (FORTAZ)  IV  2 g Intravenous Q12H  . heparin  5,000 Units Subcutaneous 3 times per day  . hydrocortisone sodium succinate  50 mg Intravenous 3 times per day  . insulin aspart  0-9 Units Subcutaneous Q6H  . insulin glargine  8 Units Subcutaneous q morning - 10a  . ipratropium-albuterol  3 mL Nebulization Q6H  . pantoprazole (PROTONIX) IV  40 mg Intravenous Q24H  . vancomycin  1,250 mg Intravenous Q24H    Time spent on care of this patient: 35 mins   Raford Brissett T , MD   [facility] Office   [phone] Pager - Text Page per Shea Evans as per below:  On-Call/Text Page:      [email or web address]      password [email or web address]  If 7PM-7AM, please contact night-coverage [email or web address] Password [email or web address] 04/01/2015, 9:26 AM   LOS: 1 day

## 2015-04-01 NOTE — Consult Note (Signed)
Daily Rounding Note  04/01/2015, 9:43 AM  LOS: 1 day   SUBJECTIVE:       No SOB, dry cough.  Feels ok.  Hungry on the [place] diet.  Large BM this AM  OBJECTIVE:         Vital signs in last 24 hours:    Temp:  [97.4 F (36.3 C)-101.2 F (38.4 C)] 97.4 F (36.3 C) (08/31 0740) Pulse Rate:  [55-124] 80 (08/31 0737) Resp:  [10-39] 18 (08/31 0737) BP: (82-142)/(36-94) 126/62 mmHg (08/31 0737) SpO2:  [86 %-100 %] 100 % (08/31 0930) Weight:  [184 lb 4.9 oz (83.6 kg)-191 lb 2.2 oz (86.7 kg)] 191 lb 2.2 oz (86.7 kg) (08/30 1639) Last BM Date: 03/30/15 Filed Weights   03/31/15 1215 03/31/15 1639  Weight: 184 lb 4.9 oz (83.6 kg) 191 lb 2.2 oz (86.7 kg)   General: looks chronically ill and frail.  Alert, comfortable   Heart: RRR.  No mrg Chest: BS better, a few crackles in right base, somewhat wet/froggy vocal quality.  No resp distress Abdomen: soft, ventral hernia.  No tenderness, ND.  Active BS  Extremities: no CCE Neuro/Psych:  Oriented x 3.  Appropriate. Moves all 4s.   Intake/Output from previous day: 08/30 0701 - 08/31 0700 In: 4310 [P.O.:360; I.V.:3900; IV Piggyback:50] Out: 3299 [Urine:1325]  Intake/Output this shift:    Lab Results:  Recent Labs  03/31/15 1100 03/31/15 1937 04/01/15 0248  WBC 18.4* 25.7* 21.5*  HGB 13.0 11.5* 11.4*  HCT 43.1 38.1* 37.4*  PLT 257 222 208   BMET  Recent Labs  03/31/15 1100 03/31/15 1937 04/01/15 0248  NA 142 140 140  K 4.2 4.4 3.9  CL 104 107 109  CO2 28 25 26   GLUCOSE 181* 149* 150*  BUN 30* 27* 24*  CREATININE 1.69* 1.53* 1.37*  CALCIUM 9.5 8.1* 8.1*   LFT  Recent Labs  03/31/15 1100 03/31/15 1937  PROT 7.3 6.1*  ALBUMIN 3.3* 2.8*  AST 33 27  ALT 26 20  ALKPHOS 45 39  BILITOT 0.6 0.7   PT/INR  Recent Labs  03/31/15 1937  LABPROT 16.0*  INR 1.27   Hepatitis Panel No results for input(s): HEPBSAG, HCVAB, HEPAIGM, HEPBIGM in the last 72  hours.  Studies/Results: Dg Chest 2 View  04/01/2015   CLINICAL DATA:  Shortness of breath, sepsis, history coronary artery disease post MI, chronic LEFT ventricular systolic dysfunction, CHF, COPD, GERD, type II diabetes mellitus, former smoker  EXAM: CHEST  2 VIEW  COMPARISON:  03/31/2015  FINDINGS: LEFT subclavian transvenous pacemaker leads project at RIGHT atrium and RIGHT ventricle, stable.  Normal heart size, mediastinal contours, and pulmonary vascularity.  Emphysematous and bronchitic changes consistent with COPD.  Increased RIGHT basilar atelectasis.  No definite acute infiltrate, pleural effusion, or pneumothorax.  Atherosclerotic calcification aortic arch.  Osseous demineralization without acute osseous findings.  IMPRESSION: COPD changes with increased RIGHT basilar atelectasis.   Electronically Signed   By: Lavonia Dana M.D.   On: 04/01/2015 07:38   Dg Chest Port 1 View  03/31/2015   CLINICAL DATA:  Shortness of breath, wheeze and cough for 2 days.  EXAM: PORTABLE CHEST - 1 VIEW  COMPARISON:  03/22/2015  FINDINGS: Chronic cardiomegaly with stable and negative aortic and hilar contours. Dual-chamber pacer leads from the left are in stable position.  Patchy airspace disease at the right base, new. No cavitation or effusion. Background hyperinflation.  IMPRESSION: Right basilar  pneumonia.   Electronically Signed   By: Monte Fantasia M.D.   On: 03/31/2015 11:12   Scheduled Meds: . budesonide (PULMICORT) nebulizer solution  0.25 mg Nebulization BID  . cefTAZidime (FORTAZ)  IV  2 g Intravenous Q12H  . heparin  5,000 Units Subcutaneous 3 times per day  . hydrocortisone sodium succinate  50 mg Intravenous 3 times per day  . insulin aspart  0-9 Units Subcutaneous Q6H  . insulin glargine  8 Units Subcutaneous q morning - 10a  . ipratropium-albuterol  3 mL Nebulization Q6H  . pantoprazole (PROTONIX) IV  40 mg Intravenous Q24H  . vancomycin  1,250 mg Intravenous Q24H   Continuous Infusions: .  sodium chloride 75 mL/hr at 03/31/15 2000   PRN Meds:.   ASSESMENT:   * Achalasia, dysphagia  * Aspiration PNA, recurrent with sepsis picture. Day 2 Vanc/Fortaz. Max WBC to 25 K, 21 K today. Last fever to 101.2 at 1045 yesterday. Hypotension and tachycardia improved.   * Ischemic CM. S/p ICD/pacemaker  * IDDM.     PLAN   *  Tentatively set up for EGD/Botox on 9/2 at 0900 if clinically ready for this.   *  Puree diet, may give better sustenance and help with hunger.     Azucena Freed  04/01/2015, 9:43 AM Pager: [phone]     Attending physician's note   I have taken an interval history, reviewed the chart and examined the patient. I agree with the Advanced Practitioner's note, impression and recommendations. Aspiration PNA improving. Full liquids today then clear liquids for entire day tomorrow prior to tentatively planned EGD/Botox on @ 0900 on Friday.   Pricilla Riffle. Fuller Plan, MD [facility] [phone] pager Mon-Fri 8a-5p [phone] weekends, holidays and 5p-8a or per [facility]

## 2015-04-01 NOTE — Progress Notes (Signed)
Pt transferred to 5W-24 in stable condition. Pt A&O. VSS. Son is at bedside. Pt voices no complaints at this time. Will continue to monitor pt. Bed remains in lowest position and call bell is within reach.

## 2015-04-02 ENCOUNTER — Inpatient Hospital Stay (HOSPITAL_COMMUNITY): Payer: Medicare Other

## 2015-04-02 ENCOUNTER — Inpatient Hospital Stay (HOSPITAL_COMMUNITY): Payer: Medicare Other | Admitting: Anesthesiology

## 2015-04-02 DIAGNOSIS — R0600 Dyspnea, unspecified: Secondary | ICD-10-CM

## 2015-04-02 LAB — BASIC METABOLIC PANEL
ANION GAP: 6 (ref 5–15)
BUN: 20 mg/dL (ref 6–20)
CHLORIDE: 106 mmol/L (ref 101–111)
CO2: 27 mmol/L (ref 22–32)
Calcium: 8.5 mg/dL — ABNORMAL LOW (ref 8.9–10.3)
Creatinine, Ser: 1.16 mg/dL (ref 0.61–1.24)
GFR, EST NON AFRICAN AMERICAN: 58 mL/min — AB (ref >60)
Glucose, Bld: 181 mg/dL — ABNORMAL HIGH (ref 65–99)
POTASSIUM: 4.3 mmol/L (ref 3.5–5.1)
SODIUM: 139 mmol/L (ref 135–145)

## 2015-04-02 LAB — COMPREHENSIVE METABOLIC PANEL
ALK PHOS: 57 U/L (ref 38–126)
ALT: 24 U/L (ref 17–63)
ANION GAP: 8 (ref 5–15)
AST: 36 U/L (ref 15–41)
Albumin: 2.8 g/dL — ABNORMAL LOW (ref 3.5–5.0)
BILIRUBIN TOTAL: 0.7 mg/dL (ref 0.3–1.2)
BUN: 23 mg/dL — ABNORMAL HIGH (ref 6–20)
CALCIUM: 8.2 mg/dL — AB (ref 8.9–10.3)
CO2: 23 mmol/L (ref 22–32)
Chloride: 109 mmol/L (ref 101–111)
Creatinine, Ser: 1.45 mg/dL — ABNORMAL HIGH (ref 0.61–1.24)
GFR calc non Af Amer: 44 mL/min — ABNORMAL LOW (ref >60)
GFR, EST AFRICAN AMERICAN: 51 mL/min — AB (ref >60)
Glucose, Bld: 206 mg/dL — ABNORMAL HIGH (ref 65–99)
Potassium: 5.9 mmol/L — ABNORMAL HIGH (ref 3.5–5.1)
Sodium: 140 mmol/L (ref 135–145)
TOTAL PROTEIN: 6.2 g/dL — AB (ref 6.5–8.1)

## 2015-04-02 LAB — CBC WITH DIFFERENTIAL/PLATELET
Basophils Absolute: 0 10*3/uL (ref 0.0–0.1)
Basophils Relative: 0 % (ref 0–1)
EOS ABS: 0 10*3/uL (ref 0.0–0.7)
Eosinophils Relative: 0 % (ref 0–5)
HEMATOCRIT: 45.4 % (ref 39.0–52.0)
HEMOGLOBIN: 13.5 g/dL (ref 13.0–17.0)
LYMPHS ABS: 1.4 10*3/uL (ref 0.7–4.0)
Lymphocytes Relative: 7 % — ABNORMAL LOW (ref 12–46)
MCH: 27.2 pg (ref 26.0–34.0)
MCHC: 29.7 g/dL — AB (ref 30.0–36.0)
MCV: 91.3 fL (ref 78.0–100.0)
MONO ABS: 1.1 10*3/uL — AB (ref 0.1–1.0)
MONOS PCT: 6 % (ref 3–12)
NEUTROS PCT: 87 % — AB (ref 43–77)
Neutro Abs: 16.1 10*3/uL — ABNORMAL HIGH (ref 1.7–7.7)
Platelets: 226 10*3/uL (ref 150–400)
RBC: 4.97 MIL/uL (ref 4.22–5.81)
RDW: 14.8 % (ref 11.5–15.5)
WBC: 18.6 10*3/uL — ABNORMAL HIGH (ref 4.0–10.5)

## 2015-04-02 LAB — CBC
HEMATOCRIT: 42.2 % (ref 39.0–52.0)
HEMOGLOBIN: 12.6 g/dL — AB (ref 13.0–17.0)
MCH: 26.8 pg (ref 26.0–34.0)
MCHC: 29.9 g/dL — ABNORMAL LOW (ref 30.0–36.0)
MCV: 89.6 fL (ref 78.0–100.0)
Platelets: 274 10*3/uL (ref 150–400)
RBC: 4.71 MIL/uL (ref 4.22–5.81)
RDW: 14.7 % (ref 11.5–15.5)
WBC: 21.3 10*3/uL — AB (ref 4.0–10.5)

## 2015-04-02 LAB — TROPONIN I
TROPONIN I: 0.14 ng/mL — AB (ref <0.031)
Troponin I: 1.26 ng/mL (ref <0.031)

## 2015-04-02 LAB — GLUCOSE, CAPILLARY
GLUCOSE-CAPILLARY: 209 mg/dL — AB (ref 65–99)
GLUCOSE-CAPILLARY: 194 mg/dL — AB (ref 65–99)
Glucose-Capillary: 173 mg/dL — ABNORMAL HIGH (ref 65–99)
Glucose-Capillary: 201 mg/dL — ABNORMAL HIGH (ref 65–99)
Glucose-Capillary: 173 mg/dL — ABNORMAL HIGH (ref 65–99)
Glucose-Capillary: 139 mg/dL — ABNORMAL HIGH (ref 65–99)

## 2015-04-02 LAB — POCT I-STAT 3, ART BLOOD GAS (G3+)
ACID-BASE DEFICIT: 4 mmol/L — AB (ref 0.0–2.0)
ACID-BASE DEFICIT: 3 mmol/L — AB (ref 0.0–2.0)
BICARBONATE: 21.9 meq/L (ref 20.0–24.0)
Bicarbonate: 27 mEq/L — ABNORMAL HIGH (ref 20.0–24.0)
O2 SAT: 100 %
O2 Saturation: 99 %
PCO2 ART: 78.6 mmHg — AB (ref 35.0–45.0)
PO2 ART: 157 mmHg — AB (ref 80.0–100.0)
Patient temperature: 97.3
TCO2: 29 mmol/L (ref 0–100)
TCO2: 23 mmol/L (ref 0–100)
pCO2 arterial: 37.7 mmHg (ref 35.0–45.0)
pH, Arterial: 7.141 — CL (ref 7.350–7.450)
pH, Arterial: 7.368 (ref 7.350–7.450)
pO2, Arterial: 419 mmHg — ABNORMAL HIGH (ref 80.0–100.0)

## 2015-04-02 LAB — LEGIONELLA ANTIGEN, URINE

## 2015-04-02 LAB — MRSA PCR SCREENING: MRSA by PCR: NEGATIVE

## 2015-04-02 LAB — BRAIN NATRIURETIC PEPTIDE: B NATRIURETIC PEPTIDE 5: 906.6 pg/mL — AB (ref 0.0–100.0)

## 2015-04-02 LAB — LACTIC ACID, PLASMA: LACTIC ACID, VENOUS: 1.6 mmol/L (ref 0.5–2.0)

## 2015-04-02 MED ORDER — CHLORHEXIDINE GLUCONATE 0.12% ORAL RINSE (MEDLINE KIT)
15.0000 mL | Freq: Two times a day (BID) | OROMUCOSAL | Status: DC
  Administered 2015-04-02 – 2015-04-11 (×15): 15 mL via OROMUCOSAL

## 2015-04-02 MED ORDER — ASPIRIN 81 MG PO CHEW
81.0000 mg | CHEWABLE_TABLET | Freq: Every day | ORAL | Status: DC
  Administered 2015-04-02 – 2015-04-05 (×3): 81 mg
  Filled 2015-04-02 (×3): qty 1

## 2015-04-02 MED ORDER — PANTOPRAZOLE SODIUM 40 MG PO PACK: 20.0000 mg | PACK | Freq: Every day | ORAL | Status: DC

## 2015-04-02 MED ORDER — PANTOPRAZOLE SODIUM 40 MG PO PACK
40.0000 mg | PACK | Freq: Every day | ORAL | Status: DC
  Administered 2015-04-02 – 2015-04-05 (×3): 40 mg
  Filled 2015-04-02 (×5): qty 20

## 2015-04-02 MED ORDER — VANCOMYCIN HCL 10 G IV SOLR
1250.0000 mg | INTRAVENOUS | Status: DC
  Administered 2015-04-02: 1250 mg via INTRAVENOUS
  Filled 2015-04-02 (×2): qty 1250

## 2015-04-02 MED ORDER — LORAZEPAM 2 MG/ML IJ SOLN
1.0000 mg | INTRAMUSCULAR | Status: DC | PRN
  Administered 2015-04-02: 1 mg via INTRAVENOUS
  Administered 2015-04-03 – 2015-04-04 (×3): 2 mg via INTRAVENOUS
  Filled 2015-04-02 (×4): qty 1

## 2015-04-02 MED ORDER — PIPERACILLIN-TAZOBACTAM 3.375 G IVPB
3.3750 g | Freq: Three times a day (TID) | INTRAVENOUS | Status: DC
  Administered 2015-04-02 – 2015-04-08 (×19): 3.375 g via INTRAVENOUS
  Filled 2015-04-02 (×23): qty 50

## 2015-04-02 MED ORDER — FENTANYL CITRATE (PF) 100 MCG/2ML IJ SOLN
50.0000 ug | Freq: Once | INTRAMUSCULAR | Status: AC
  Administered 2015-04-02: 50 ug via INTRAVENOUS

## 2015-04-02 MED ORDER — METHYLPREDNISOLONE SODIUM SUCC 40 MG IJ SOLR
40.0000 mg | Freq: Four times a day (QID) | INTRAMUSCULAR | Status: DC
  Administered 2015-04-02 – 2015-04-06 (×17): 40 mg via INTRAVENOUS
  Filled 2015-04-02 (×17): qty 1

## 2015-04-02 MED ORDER — SUCCINYLCHOLINE CHLORIDE 20 MG/ML IJ SOLN
INTRAMUSCULAR | Status: DC | PRN
  Administered 2015-04-02: 120 mg via INTRAVENOUS

## 2015-04-02 MED ORDER — PERFLUTREN LIPID MICROSPHERE
1.0000 mL | INTRAVENOUS | Status: AC | PRN
  Administered 2015-04-02: 4 mL via INTRAVENOUS
  Filled 2015-04-02: qty 10

## 2015-04-02 MED ORDER — MEMANTINE HCL 10 MG PO TABS
10.0000 mg | ORAL_TABLET | Freq: Two times a day (BID) | ORAL | Status: DC
  Filled 2015-04-02: qty 1

## 2015-04-02 MED ORDER — IPRATROPIUM-ALBUTEROL 0.5-2.5 (3) MG/3ML IN SOLN
3.0000 mL | RESPIRATORY_TRACT | Status: DC
  Administered 2015-04-02 – 2015-04-04 (×15): 3 mL via RESPIRATORY_TRACT
  Filled 2015-04-02 (×14): qty 3

## 2015-04-02 MED ORDER — PANTOPRAZOLE SODIUM 40 MG PO TBEC: 40.0000 mg | DELAYED_RELEASE_TABLET | Freq: Every day | ORAL | Status: DC

## 2015-04-02 MED ORDER — MIDAZOLAM HCL 2 MG/2ML IJ SOLN
2.0000 mg | Freq: Once | INTRAMUSCULAR | Status: AC
  Administered 2015-04-02: 2 mg via INTRAVENOUS

## 2015-04-02 MED ORDER — ANTISEPTIC ORAL RINSE SOLUTION (CORINZ)
7.0000 mL | Freq: Four times a day (QID) | OROMUCOSAL | Status: DC
  Administered 2015-04-03 – 2015-04-10 (×27): 7 mL via OROMUCOSAL

## 2015-04-02 MED ORDER — DONEPEZIL HCL 10 MG PO TABS: 10.0000 mg | ORAL_TABLET | Freq: Every morning | ORAL | Status: DC

## 2015-04-02 MED ORDER — INSULIN ASPART 100 UNIT/ML ~~LOC~~ SOLN
0.0000 [IU] | SUBCUTANEOUS | Status: DC
  Administered 2015-04-02: 2 [IU] via SUBCUTANEOUS
  Administered 2015-04-02: 1 [IU] via SUBCUTANEOUS
  Administered 2015-04-03 (×2): 2 [IU] via SUBCUTANEOUS
  Administered 2015-04-03 (×2): 3 [IU] via SUBCUTANEOUS
  Administered 2015-04-03 – 2015-04-04 (×4): 2 [IU] via SUBCUTANEOUS
  Administered 2015-04-04: 1 [IU] via SUBCUTANEOUS
  Administered 2015-04-04: 7 [IU] via SUBCUTANEOUS
  Administered 2015-04-04: 2 [IU] via SUBCUTANEOUS
  Administered 2015-04-05 (×2): 3 [IU] via SUBCUTANEOUS
  Administered 2015-04-05 (×2): 1 [IU] via SUBCUTANEOUS

## 2015-04-02 MED ORDER — PRAVASTATIN SODIUM 40 MG PO TABS
40.0000 mg | ORAL_TABLET | Freq: Every evening | ORAL | Status: DC
  Administered 2015-04-02: 40 mg
  Filled 2015-04-02 (×4): qty 1

## 2015-04-02 MED ORDER — MIDAZOLAM HCL 2 MG/2ML IJ SOLN
INTRAMUSCULAR | Status: AC
  Filled 2015-04-02: qty 2

## 2015-04-02 MED ORDER — LOSARTAN POTASSIUM 25 MG PO TABS: 25.0000 mg | ORAL_TABLET | Freq: Every day | ORAL | Status: DC

## 2015-04-02 MED ORDER — FENTANYL CITRATE (PF) 100 MCG/2ML IJ SOLN
INTRAMUSCULAR | Status: AC
  Filled 2015-04-02: qty 2

## 2015-04-02 MED ORDER — FENTANYL CITRATE (PF) 100 MCG/2ML IJ SOLN
25.0000 ug | INTRAMUSCULAR | Status: DC | PRN
  Administered 2015-04-02 – 2015-04-03 (×6): 50 ug via INTRAVENOUS
  Filled 2015-04-02 (×6): qty 2

## 2015-04-02 MED ORDER — ETOMIDATE 2 MG/ML IV SOLN
INTRAVENOUS | Status: DC | PRN
  Administered 2015-04-02: 12 mg via INTRAVENOUS

## 2015-04-02 MED FILL — Medication: Qty: 1 | Status: AC

## 2015-04-02 NOTE — Procedures (Signed)
Central Venous Catheter Insertion Procedure Note Antonio Ray [id number] 08-12-1933  Procedure: Insertion of Central Venous Catheter Indications: Assessment of intravascular volume, Drug and/or fluid administration and Frequent blood sampling  Procedure Details Consent: Risks of procedure as well as the alternatives and risks of each were explained to the (patient/caregiver).  Consent for procedure obtained. Time Out: Verified patient identification, verified procedure, site/side was marked, verified correct patient position, special equipment/implants available, medications/allergies/relevent history reviewed, required imaging and test results available.  Performed  Maximum sterile technique was used including antiseptics, cap, gloves, gown, hand hygiene, mask and sheet. Skin prep: Chlorhexidine; local anesthetic administered A antimicrobial bonded/coated triple lumen catheter was placed in the left internal jugular vein using the Seldinger technique.  Evaluation Blood flow good Complications: No apparent complications Patient did tolerate procedure well. Chest X-ray ordered to verify placement.  CXR: pending.  Procedure performed under direct ultrasound guidance for real time vessel cannulation.      Antonio Ray, [facility] Pager: [phone]  or [phone] 04/02/2015, 6:55 PM

## 2015-04-02 NOTE — Progress Notes (Signed)
[facility] hospitalist notified that MUSE fired. Arthor Captain LPN

## 2015-04-02 NOTE — Progress Notes (Signed)
Daily Rounding Note  04/02/2015, 1:24 PM  LOS: 2 days   SUBJECTIVE:       Transfered from [place] to medical ICU 10 AM today after pt found apneic.  Unclear how long this had been present.  Found by his son, a Airline pilot, who initiated CPR and administered bag mask O2 til pt intubated. Pt was intubated for resp failure, no defibrillation or CPR.  Not clear if aspiration event preceded the apnea.  Fentanyl given prior to intubation.  Solumedrol initiated for bronchospasm.   Pt had not touched the breakfast tray delivered to his room ~8 AM.   OBJECTIVE:         Vital signs in last 24 hours:    Temp:  [97.3 F (36.3 C)-98.3 F (36.8 C)] 97.3 F (36.3 C) (09/01 1127) Pulse Rate:  [66-106] 97 (09/01 1300) Resp:  [16-25] 22 (09/01 1300) BP: (82-149)/(48-87) 96/66 mmHg (09/01 1300) SpO2:  [94 %-100 %] 100 % (09/01 1300) FiO2 (%):  [50 %-100 %] 50 % (09/01 1158) Last BM Date: 04/01/15 Filed Weights   03/31/15 1215 03/31/15 1639  Weight: 184 lb 4.9 oz (83.6 kg) 191 lb 2.2 oz (86.7 kg)   General: unresponsive on vent.  Not agitated   Heart: RRR Chest: clear bil  Abdomen: soft, NT.  Active BS  Extremities: slight LE edema, not pitting Neuro/Psych:  Calm on vent.   Intake/Output from previous day: 08/31 0701 - 09/01 0700 In: 1586.7 [P.O.:240; I.V.:1346.7] Out: 750 [Urine:750]  Intake/Output this shift: Total I/O In: 550 [I.V.:250; IV Piggyback:300] Out: -   Lab Results:  Recent Labs  04/01/15 0248 04/02/15 0649 04/02/15 1015  WBC 21.5* 21.3* 18.6*  HGB 11.4* 12.6* 13.5  HCT 37.4* 42.2 45.4  PLT 208 274 226   BMET  Recent Labs  04/01/15 0248 04/02/15 0649 04/02/15 1015  NA 140 139 140  K 3.9 4.3 5.9*  CL 109 106 109  CO2 26 27 23   GLUCOSE 150* 181* 206*  BUN 24* 20 23*  CREATININE 1.37* 1.16 1.45*  CALCIUM 8.1* 8.5* 8.2*   LFT  Recent Labs  03/31/15 1100 03/31/15 1937 04/02/15 1015  PROT 7.3  6.1* 6.2*  ALBUMIN 3.3* 2.8* 2.8*  AST 33 27 36  ALT 26 20 24   ALKPHOS 45 39 57  BILITOT 0.6 0.7 0.7   PT/INR  Recent Labs  03/31/15 1937  LABPROT 16.0*  INR 1.27   Hepatitis Panel No results for input(s): HEPBSAG, HCVAB, HEPAIGM, HEPBIGM in the last 72 hours.  Studies/Results: Dg Chest 2 View  04/01/2015   CLINICAL DATA:  Shortness of breath, sepsis, history coronary artery disease post MI, chronic LEFT ventricular systolic dysfunction, CHF, COPD, GERD, type II diabetes mellitus, former smoker  EXAM: CHEST  2 VIEW  COMPARISON:  03/31/2015  FINDINGS: LEFT subclavian transvenous pacemaker leads project at RIGHT atrium and RIGHT ventricle, stable.  Normal heart size, mediastinal contours, and pulmonary vascularity.  Emphysematous and bronchitic changes consistent with COPD.  Increased RIGHT basilar atelectasis.  No definite acute infiltrate, pleural effusion, or pneumothorax.  Atherosclerotic calcification aortic arch.  Osseous demineralization without acute osseous findings.  IMPRESSION: COPD changes with increased RIGHT basilar atelectasis.   Electronically Signed   By: Lavonia Dana M.D.   On: 04/01/2015 07:38   Dg Chest Port 1 View  04/02/2015   CLINICAL DATA:  Hypoxia  EXAM: PORTABLE CHEST - 1 VIEW  COMPARISON:  Study obtained earlier in  the day  FINDINGS: Endotracheal tube tip is 2.8 cm above the carina. Nasogastric tube tip and side port are in the stomach. Pacemaker leads are attached to the right atrium and right ventricle. No pneumothorax. There is no edema or consolidation. The heart is upper normal in size with pulmonary vascularity within normal limits. No adenopathy. There is atherosclerotic calcification in the aortic arch region.  IMPRESSION: Tube positions as described without pneumothorax. No edema or consolidation. The ill-defined opacity in the right base seen earlier in the day is not appreciable on this examination. Cardiac silhouette within normal limits.   Electronically  Signed   By: Lowella Grip III M.D.   On: 04/02/2015 11:05   Dg Chest Port 1 View  04/02/2015   CLINICAL DATA:  Dyspnea, onset tonight.  EXAM: PORTABLE CHEST - 1 VIEW  COMPARISON:  04/01/2015  FINDINGS: There is developing basilar opacity on the right which could represent infectious infiltrate. No large effusion is evident. Mild interstitial and vascular prominence is present.  IMPRESSION: Developing patchy right base opacity which could represent pneumonia. Noninfectious atelectasis is also possibility.   Electronically Signed   By: Andreas Newport M.D.   On: 04/02/2015 05:23   Scheduled Meds: . aspirin  81 mg Per Tube Daily  . heparin  5,000 Units Subcutaneous 3 times per day  . insulin aspart  0-9 Units Subcutaneous 6 times per day  . insulin glargine  8 Units Subcutaneous q morning - 10a  . ipratropium-albuterol  3 mL Nebulization Q4H  . methylPREDNISolone (SOLU-MEDROL) injection  40 mg Intravenous Q6H  . pantoprazole sodium  40 mg Per Tube Daily  . piperacillin-tazobactam (ZOSYN)  IV  3.375 g Intravenous Q8H  . pravastatin  40 mg Per Tube QPM  . vancomycin  1,250 mg Intravenous Q24H   Continuous Infusions: . sodium chloride    . sodium chloride 50 mL/hr (04/02/15 0536)   PRN Meds:.acetaminophen (TYLENOL) oral liquid 160 mg/5 mL, fentaNYL (SUBLIMAZE) injection, LORazepam   ASSESMENT:   * Achalasia, dysphagia  * Aspiration PNA, recurrent with sepsis picture. Day 3 Vanc/Fortaz. Max WBC to 25 K, 21 K today. Last fever to 101.2 at 1045 yesterday. Hypotension and tachycardia improved.  Apnea, resp failure requiring ETT/vent.  ? If unobserved, spontaneous aspiration event preceded this?   * Ischemic CM. S/p ICD/pacemaker  * IDDM.     PLAN   *  Cancelled the EGD/dilatation that was set for tomorrow. *  Observe for recovery, hopefully he has not sustained anoxic brain injury.   *  Plan for EGD/dilation in future when pt recovers.    Azucena Freed  04/02/2015, 1:24  PM Pager: [phone]     Attending physician's note   I have taken an interval history, reviewed the chart and examined the patient. I agree with the Advanced Practitioner's note, impression and recommendations. Respiratory arrest this morning, now intubated and sedated. Cancel EGD for tomorrow. Ideally would like to proceed with EGD/Botox as inpt when he has made a very good recovery. Unusual to have so many serious aspiration events due to achalasia in a patient who is alert and very functional at baseline. Consider oropharyngeal dysphagia and other etiologies contributing to or causing PNA as well. He should have a speech pathology evaluation when he is able to cooperate. We will check back on his progress in a few days. Please call if GI assistance needed.   Pricilla Riffle. Fuller Plan, MD [facility] [phone] pager Mon-Fri 8a-5p [phone] weekends, holidays and 5p-8a or  per [employer]

## 2015-04-02 NOTE — Progress Notes (Signed)
CRITICAL VALUE ALERT  Critical value received:Trop 1.26  Date of notification:  04/02/2015  Time of notification:  2345  Critical value read back:Yes.    Nurse who received alert:  J.Lawsen Arnott  MD notified (1st page):  Elink MD  Time of first page:  2345  MD notified (2nd page):  Time of second page:  Responding MD:  Warren Lacy MD  Time MD responded:  [phone]

## 2015-04-02 NOTE — Transfer of Care (Signed)
Immediate Anesthesia Transfer of Care Note  Patient: Antonio Ray  Procedure(s) Performed: * No procedures listed *  Patient Location: 5west  Anesthesia Type:General  Level of Consciousness: sedated and unresponsive  Airway & Oxygen Therapy: Patient placed on Ventilator (see vital sign flow sheet for setting) and Intubation for code RT ventilating with ambu bag while vent getting set up  Post-op Assessment: Post -op Vital signs reviewed and stable  Post vital signs: Reviewed and stable  Last Vitals: There were no vitals filed for this visit.  Complications: No apparent anesthesia complications

## 2015-04-02 NOTE — Anesthesia Procedure Notes (Signed)
Date/Time: 04/02/2015 9:40 AM Performed by: Laretta Alstrom Pre-anesthesia Checklist: Emergency Drugs available and Suction available Patient Re-evaluated:Patient Re-evaluated prior to inductionOxygen Delivery Method: Ambu bag Preoxygenation: Pre-oxygenation with 100% oxygen Intubation Type: IV induction, Rapid sequence and Cricoid Pressure applied Ventilation: Mask ventilation without difficulty Laryngoscope Size: Mac and 4 Grade View: Grade II Tube type: Subglottic suction tube Tube size: 8.0 mm Number of attempts: 1 Placement Confirmation: ETT inserted through vocal cords under direct vision,  positive ETCO2,  CO2 detector and breath sounds checked- equal and bilateral (capnography utilized) Secured at: 23 cm Tube secured with: Tape Dental Injury: Teeth and Oropharynx as per pre-operative assessment

## 2015-04-02 NOTE — Progress Notes (Signed)
  Echocardiogram 2D Echocardiogram has been performed.  Antonio Ray 04/02/2015, 3:36 PM

## 2015-04-02 NOTE — Progress Notes (Signed)
RT note-increased VT per protocol for 8cc/kg

## 2015-04-02 NOTE — Progress Notes (Signed)
04-02-15 0945 Cardiac orders- signed-Dr. Allred and placed with chart 04-01-15. No post op interrogation required. No Rep to be on site.

## 2015-04-02 NOTE — Code Documentation (Signed)
  Patient Name: Antonio Ray   MRN: [id number]   Date of Birth/ Sex: 05-Apr-1934 , male      Admission Date: 03/31/2015  Attending Provider: Donne Hazel, MD  Primary Diagnosis: Sepsis   Indication: Pt was in his usual state of health until this AM, when he was noted to be apneic breathing. Code blue was subsequently called. At the time of arrival on scene, ACLS protocol was underway.   Technical Description:  - CPR performance duration:  none  - Was defibrillation or cardioversion used? No   - Was external pacer placed? No  - Was patient intubated pre/post CPR? Yes   Medications Administered: Y = Yes; Blank = No Amiodarone    Atropine    Calcium    Epinephrine    Lidocaine    Magnesium    Norepinephrine    Phenylephrine    Sodium bicarbonate    Vasopressin     Post CPR evaluation:  - Final Status - Was patient successfully resuscitated ? Yes - What is current rhythm? Sinus tachycardia - What is current hemodynamic status? stable  Miscellaneous Information:  - Labs sent, including: CBC, troponin, Bmet, lactic acid  - Primary team notified?  Yes, Dr. Wyline Copas present at bedside  - Family Notified? Yes, son present at bedside throughout episode  - Additional notes/ transfer status:  Pt transferred to MICU     Collier Salina, MD  04/02/2015, 9:46 AM Dellia Nims, MD

## 2015-04-02 NOTE — Progress Notes (Signed)
[name] Progress Note Patient Name: Antonio Ray DOB: Feb 13, 1934 MRN: [id number]   Date of Service  04/02/2015  HPI/Events of Note  Hemoccult positive, not actively bleeding  eICU Interventions  Check cbc     Intervention Category Intermediate Interventions: Diagnostic test evaluation  [name] 04/02/2015, 3:08 PM

## 2015-04-02 NOTE — Progress Notes (Signed)
Pt C/O shortness of breath after he sat on side of bed to urininate O2 stats 94% on room air wheezes lower lobe rt greater than left but diminished breath sounds Pt states he has been using the flutter valve. Placed on 2l O2 will continue to monitor. Arthor Captain LPN

## 2015-04-02 NOTE — Progress Notes (Signed)
[place] for Vancomycin and Zosyn Indication: rule out sepsis and recurrent pneumonia/HCAP  Allergies  Allergen Reactions  . Codeine Other (See Comments)    Gets very angry, disoriented  . Dilaudid [Hydromorphone Hcl] Other (See Comments)    VERY AGITATED, HOSTILE  . Flomax [Tamsulosin Hcl] Shortness Of Breath  . Morphine And Related Other (See Comments)    VERY AGITATED, HOSTILE  . Sulfa Antibiotics Shortness Of Breath  . Beta Adrenergic Blockers     ? disorientation  . Carvedilol Other (See Comments)    DISORIENTATION  . Levofloxacin Other (See Comments)    unknown    Patient Measurements: Height: 5\' 10"  (177.8 cm) Weight: 191 lb 2.2 oz (86.7 kg) IBW/kg (Calculated) : 73  Vital Signs: Temp: 97.6 F (36.4 C) (09/01 0604) Temp Source: Oral (09/01 0604) BP: 149/65 mmHg (09/01 0604) Pulse Rate: 106 (09/01 0604)  Labs:  Recent Labs  03/31/15 1937 04/01/15 0248 04/02/15 0649  WBC 25.7* 21.5* 21.3*  HGB 11.5* 11.4* 12.6*  PLT 222 208 274  CREATININE 1.53* 1.37* 1.16   Estimated Creatinine Clearance: 53.3 mL/min (by C-G formula based on Cr of 1.16).  Microbiology: Recent Results (from the past 720 hour(s))  Blood culture (routine x 2)     Status: None   Collection Time: 03/21/15 10:45 AM  Result Value Ref Range Status   Specimen Description BLOOD RIGHT ANTECUBITAL  Final   Special Requests BOTTLES DRAWN AEROBIC AND ANAEROBIC 10ML  Final   Culture NO GROWTH 5 DAYS  Final   Report Status 03/26/2015 FINAL  Final  Blood culture (routine x 2)     Status: None   Collection Time: 03/21/15 11:00 AM  Result Value Ref Range Status   Specimen Description BLOOD LEFT ANTECUBITAL  Final   Special Requests BOTTLES DRAWN AEROBIC AND ANAEROBIC 10ML  Final   Culture NO GROWTH 5 DAYS  Final   Report Status 03/26/2015 FINAL  Final  Blood Culture (routine x 2)     Status: None (Preliminary result)   Collection Time: 03/31/15 11:00 AM  Result  Value Ref Range Status   Specimen Description BLOOD LEFT ANTECUBITAL  Final   Special Requests BOTTLES DRAWN AEROBIC AND ANAEROBIC 5ML  Final   Culture NO GROWTH < 24 HOURS  Final   Report Status PENDING  Incomplete  Blood Culture (routine x 2)     Status: None (Preliminary result)   Collection Time: 03/31/15 11:17 AM  Result Value Ref Range Status   Specimen Description BLOOD LEFT ARM  Final   Special Requests BOTTLES DRAWN AEROBIC AND ANAEROBIC 5ML  Final   Culture NO GROWTH < 24 HOURS  Final   Report Status PENDING  Incomplete  Urine culture     Status: None   Collection Time: 03/31/15 11:40 AM  Result Value Ref Range Status   Specimen Description URINE, CLEAN CATCH  Final   Special Requests NONE  Final   Culture NO GROWTH 1 DAY  Final   Report Status 04/01/2015 FINAL  Final  MRSA PCR Screening     Status: None   Collection Time: 03/31/15  5:09 PM  Result Value Ref Range Status   MRSA by PCR NEGATIVE NEGATIVE Final    Comment:        The GeneXpert MRSA Assay (FDA approved for NASAL specimens only), is one component of a comprehensive MRSA colonization surveillance program. It is not intended to diagnose MRSA infection nor to guide or monitor treatment  for MRSA infections.   Culture, sputum-assessment     Status: None   Collection Time: 04/01/15  6:15 AM  Result Value Ref Range Status   Specimen Description SPUTUM  Final   Special Requests NONE  Final   Sputum evaluation   Final    THIS SPECIMEN IS ACCEPTABLE. RESPIRATORY CULTURE REPORT TO FOLLOW.   Report Status 04/01/2015 FINAL  Final  Culture, respiratory (NON-Expectorated)     Status: None (Preliminary result)   Collection Time: 04/01/15  6:15 AM  Result Value Ref Range Status   Specimen Description EXPECTORATED SPUTUM  Final   Special Requests NONE  Final   Gram Stain   Final    FEW WBC PRESENT, PREDOMINANTLY PMN RARE SQUAMOUS EPITHELIAL CELLS PRESENT RARE GRAM POSITIVE COCCI IN PAIRS RARE GRAM NEGATIVE  COCCI RARE GRAM NEGATIVE RODS    Culture   Final    NORMAL OROPHARYNGEAL FLORA Performed at [facility]    Report Status PENDING  Incomplete    Medical History: Past Medical History  Diagnosis Date  . GERD (gastroesophageal reflux disease)   . Coronary artery disease     History of remote anterior MI with PCI to LAD in 2006; most recent cath 2007, no intervention required  . Chronic systolic dysfunction of left ventricle     EF 30-35%, CLASS II - III SYMPTOMS; intolerant to Coreg  . Hyperlipidemia   . PVC's (premature ventricular contractions)   . BPH (benign prostatic hyperplasia)   . CHF (congestive heart failure)   . COPD (chronic obstructive pulmonary disease)   . Heart murmur     hx  . Pneumonia "several times"    aspiration pna with at least 3 admits for this 2016.   . Type II diabetes mellitus   . CKD (chronic kidney disease) stage 3, GFR 30-59 ml/min 06/08/2012  . Skin cancer "several"    "forearms; head"  . Memory loss   . Anginal pain   . Dysrhythmia   . Myocardial infarction 1994    ANTERIOR  . AICD (automatic cardioverter/defibrillator) present 03/30/2011    Analyze ST study patient  . DOE (dyspnea on exertion)     with heavy exertion   Assessment:   81 yr old male to be admitted with recurrent aspiration pneumonia.  Was just in the hospital 8/20-23/2016. Received Zosyn 8/20>>8/23, transitioned to Augmentin on 8/23 for 4 more days as outpatient.   Pharmacy asked to restart Vancomycin and Zosyn  Goal of Therapy:  Vancomycin trough level 15-20 mcg/ml appropriate Zosyn dose for renal function and infection  Plan:   Vancomycin 1250 mg IV q24hrs.  First dose in am.  Zosyn 3.375 gm IV q8hrs (each over 4 hours).  Follow renal function, culture data, progress.  Thank you Anette Guarneri, PharmD [phone] 04/02/2015,9:57 AM

## 2015-04-02 NOTE — Anesthesia Postprocedure Evaluation (Signed)
  Anesthesia Post-op Note  Patient: Antonio Ray  Procedure(s) Performed: * No procedures listed *  Patient Location: 5[facility]  Anesthesia Type:General  Level of Consciousness: sedated and unresponsive  Airway and Oxygen Therapy: Patient placed on Ventilator (see vital sign flow sheet for setting)  Post-op Pain: none  Post-op Assessment: Patient's Cardiovascular Status Stable, Respiratory Function Stable, Patent Airway and No signs of Nausea or vomiting              Post-op Vital Signs: Reviewed and stable  Last Vitals: There were no vitals filed for this visit.  Complications: No apparent anesthesia complications

## 2015-04-02 NOTE — Progress Notes (Signed)
See code blue notes. Patient was transferred to floor from SDU overnight. Before being seen this AM, Code Blue was called for apparent respiratory arrest. I was only notified that patient had coded with no other alerts received this AM. Patient has since been intubated and has been transferred to ICU.

## 2015-04-02 NOTE — Progress Notes (Signed)
Patient has not urinated, since transfer to unit.  Bladder scanned patient, amount was 80cc.

## 2015-04-02 NOTE — Consult Note (Signed)
PULMONARY / CRITICAL CARE MEDICINE   Name: Antonio Ray MRN: [id number] DOB: [birth year]    ADMISSION DATE:  03/31/2015 CONSULTATION DATE:  9/1  REFERRING MD :  Earlie Counts (triad)   CHIEF COMPLAINT:  Respiratory arrest   INITIAL PRESENTATION:  81 yo male former smoker with multiple medical problems including CAD, chronic sCHF, COPD, CKD, DM. Recently admitted 8/20-8/23 with AECOPD and ?aspiration PNA.  Returned 8/30 with increased cough and SOB.  Admitted with HCAP v aspiration PNA.  Was progressing, tx from SDU to floor. On 9/1 pts son found him lethargic, agonal respirations.  He bagged him, code blue was called and pt intubated by anesthesia, tx to ICU.   STUDIES:  2D echo 9/1>>>  SIGNIFICANT EVENTS: 8/20-8/23 admit for AECOPD, aspiration PNA  8/30 readmitted    HISTORY OF PRESENT ILLNESS:  81 yo male former smoker with multiple medical problems including CAD, chronic sCHF, COPD, CKD, DM. Recently admitted 8/20-8/23 with AECOPD and ?aspiration PNA.  Returned 8/30 with increased cough and SOB.  Admitted with HCAP v aspiration PNA.  Was progressing, tx from SDU to floor. On 9/1 pts son found him lethargic, agonal respirations.  He bagged him, code blue was called and pt intubated by anesthesia, tx to ICU.   PAST MEDICAL HISTORY :   has a past medical history of GERD (gastroesophageal reflux disease); Coronary artery disease; Chronic systolic dysfunction of left ventricle; Hyperlipidemia; PVC's (premature ventricular contractions); BPH (benign prostatic hyperplasia); CHF (congestive heart failure); COPD (chronic obstructive pulmonary disease); Heart murmur; Pneumonia ("several times"); Type II diabetes mellitus; CKD (chronic kidney disease) stage 3, GFR 30-59 ml/min (06/08/2012); Skin cancer ("several"); Memory loss; Anginal pain; Dysrhythmia; Myocardial infarction (1994); AICD (automatic cardioverter/defibrillator) present (03/30/2011); and DOE (dyspnea on exertion).  has past surgical history  that includes Foot fracture surgery (Right, 1980's); Cholecystectomy (2/11); Cataract extraction w/ intraocular lens  implant, bilateral (08/2014-09/2014); Cardiac catheterization (01/11/2006); Coronary angioplasty (1994); Skin cancer excision ("several"); Esophageal manometry (N/A, 03/16/2015); and Insert / replace / remove pacemaker. Prior to Admission medications   Medication Sig Start Date End Date Taking? Authorizing Provider  aspirin 81 MG EC tablet Take 81 mg by mouth daily.     Yes Historical Provider, MD  budesonide-formoterol (SYMBICORT) 160-4.5 MCG/ACT inhaler Inhale 2 puffs into the lungs 2 (two) times daily. 09/19/14  Yes Juanito Doom, MD  Cholecalciferol (VITAMIN D PO) Take 2,000 Units by mouth daily.    Yes Historical Provider, MD  donepezil (ARICEPT) 10 MG tablet Take 10 mg by mouth every morning.   Yes Historical Provider, MD  fenofibrate (TRICOR) 145 MG tablet TAKE 1 TABLET BY MOUTH EVERY DAY 01/22/15  Yes Peter M Martinique, MD  furosemide (LASIX) 20 MG tablet TAKE 1 TABLET BY MOUTH EVERY DAY 09/19/14  Yes Peter M Martinique, MD  insulin glargine (LANTUS) 100 UNIT/ML injection Inject 24 Units into the skin every morning.    Yes Historical Provider, MD  losartan (COZAAR) 25 MG tablet Take 1 tablet (25 mg total) by mouth daily. 03/27/15  Yes Thurnell Lose, MD  memantine (NAMENDA) 10 MG tablet Take 1 tablet (10 mg total) by mouth 2 (two) times daily. 01/19/15  Yes Marcial Pacas, MD  Multiple Vitamins-Minerals (PRESERVISION AREDS 2) CAPS Take 1 capsule by mouth 2 (two) times daily.   Yes Historical Provider, MD  nitroGLYCERIN (NITRODUR - DOSED IN MG/24 HR) 0.2 mg/hr patch Place 0.2 mg onto the skin daily.   Yes Historical Provider, MD  pantoprazole (PROTONIX) 20 MG  tablet TAKE 1 TABLET BY MOUTH EVERY DAY 07/18/14  Yes Inda Castle, MD  pravastatin (PRAVACHOL) 40 MG tablet TAKE 1 TABLET BY MOUTH EVERY EVENING 02/20/15  Yes Peter M Martinique, MD  tiotropium (SPIRIVA) 18 MCG inhalation capsule Place 1  capsule (18 mcg total) into inhaler and inhale daily. 03/12/15  Yes Juanito Doom, MD  PROVENTIL HFA 108 (90 BASE) MCG/ACT inhaler Inhale 2 puffs into the lungs every 6 (six) hours as needed for wheezing or shortness of breath. As directed 05/24/13   Historical Provider, MD   Allergies  Allergen Reactions  . Codeine Other (See Comments)    Gets very angry, disoriented  . Dilaudid [Hydromorphone Hcl] Other (See Comments)    VERY AGITATED, HOSTILE  . Flomax [Tamsulosin Hcl] Shortness Of Breath  . Morphine And Related Other (See Comments)    VERY AGITATED, HOSTILE  . Sulfa Antibiotics Shortness Of Breath  . Beta Adrenergic Blockers     ? disorientation  . Carvedilol Other (See Comments)    DISORIENTATION  . Levofloxacin Other (See Comments)    unknown    FAMILY HISTORY:  indicated that his mother is deceased. He indicated that his father is deceased. He indicated that only one of his two sisters is alive.  SOCIAL HISTORY:  reports that he quit smoking about 22 years ago. His smoking use included Cigarettes. He has a 35 pack-year smoking history. He has never used smokeless tobacco. He reports that he drinks alcohol. He reports that he does not use illicit drugs.  REVIEW OF SYSTEMS:  Unable.  As per HPI obtained from records and pts son.   SUBJECTIVE:   VITAL SIGNS: Temp:  [97.5 F (36.4 C)-98.3 F (36.8 C)] 97.9 F (36.6 C) (09/01 1012) Pulse Rate:  [66-106] 106 (09/01 0604) Resp:  [16-22] 18 (09/01 0604) BP: (117-149)/(57-87) 149/65 mmHg (09/01 0604) SpO2:  [94 %-98 %] 94 % (09/01 0604) FiO2 (%):  [50 %-100 %] 50 % (09/01 1042) HEMODYNAMICS:   VENTILATOR SETTINGS: Vent Mode:  [-] PRVC FiO2 (%):  [50 %-100 %] 50 % Set Rate:  [16 bmp-25 bmp] 25 bmp Vt Set:  [500 mL-580 mL] 580 mL PEEP:  [5 cmH20] 5 cmH20 Plateau Pressure:  [24 cmH20] 24 cmH20 INTAKE / OUTPUT:  Intake/Output Summary (Last 24 hours) at 04/02/15 1106 Last data filed at 04/02/15 0700  Gross per 24 hour   Intake 1586.67 ml  Output    750 ml  Net 836.67 ml    PHYSICAL EXAMINATION: General:  Chronically ill appearing male, NAD post respiratory arrest  Neuro:  Opens eyes spontaneously, tracks, trying to pull at tube, MAE, gen weakness, does not follow commands  HEENT:  Mm moist, no sig JVD, ETT Cardiovascular:  s1s2 rrr, mild tachy Lungs:  resps even, non labored on vent, diffuse exp wheeze, diminished R  Abdomen:  Round, mildly distended, +bs  Musculoskeletal:  Warm and dry, scant BLE edema   LABS:  CBC  Recent Labs Lab 04/01/15 0248 04/02/15 0649 04/02/15 1015  WBC 21.5* 21.3* 18.6*  HGB 11.4* 12.6* 13.5  HCT 37.4* 42.2 45.4  PLT 208 274 226   Coag's  Recent Labs Lab 03/31/15 1937  APTT 30  INR 1.27   BMET  Recent Labs Lab 03/31/15 1937 04/01/15 0248 04/02/15 0649  NA 140 140 139  K 4.4 3.9 4.3  CL 107 109 106  CO2 25 26 27   BUN 27* 24* 20  CREATININE 1.53* 1.37* 1.16  GLUCOSE 149*  150* 181*   Electrolytes  Recent Labs Lab 03/31/15 1937 04/01/15 0248 04/02/15 0649  CALCIUM 8.1* 8.1* 8.5*   Sepsis Markers  Recent Labs Lab 03/31/15 1252 03/31/15 1937 03/31/15 2213  LATICACIDVEN 1.24 1.4 1.6  PROCALCITON  --  2.83  --    ABG  Recent Labs Lab 04/02/15 1034  PHART 7.141*  PCO2ART 78.6*  PO2ART 419.0*   Liver Enzymes  Recent Labs Lab 03/31/15 1100 03/31/15 1937  AST 33 27  ALT 26 20  ALKPHOS 45 39  BILITOT 0.6 0.7  ALBUMIN 3.3* 2.8*   Cardiac Enzymes No results for input(s): TROPONINI, PROBNP in the last 168 hours. Glucose  Recent Labs Lab 04/01/15 0556 04/01/15 1220 04/01/15 1715 04/01/15 2133 04/02/15 0757 04/02/15 1007  GLUCAP 128* 196* 142* 181* 209* 201*    Imaging Dg Chest Port 1 View  04/02/2015   CLINICAL DATA:  Dyspnea, onset tonight.  EXAM: PORTABLE CHEST - 1 VIEW  COMPARISON:  04/01/2015  FINDINGS: There is developing basilar opacity on the right which could represent infectious infiltrate. No large  effusion is evident. Mild interstitial and vascular prominence is present.  IMPRESSION: Developing patchy right base opacity which could represent pneumonia. Noninfectious atelectasis is also possibility.   Electronically Signed   By: Andreas Newport M.D.   On: 04/02/2015 05:23     ASSESSMENT / PLAN:  PULMONARY OETT 9/1>>> Acute respiratory failure  AECOPD Recurrent aspiration PNA/pneumonitis v HCAP P:   Vent support - 8cc/kg  F/u CXR  F/u ABG abx as below  Increase RR  Systemic steroids  BD's   CARDIOVASCULAR Hx CAD  sCHF - EF 35%  Hypotension - mild.  Baseline SBP ~100 per son  P:  Hold losartan for now  Stat troponin, 12 lead, BNP Echo now   RENAL CKD III - stable  P:   F/u chem   GASTROINTESTINAL Dysphagia with recurrent aspiration PNA  Achalasia  P:   GI following - tentative plan was for EGD with botox on 9/2 under MAC -- consider doing while intubated   PPI    HEMATOLOGIC No active issue  P:  SQ heparin   INFECTIOUS HCAP v recurrent aspiration PNA  P:   Vanc 9/1>>> Zosyn 9/1>>>  ENDOCRINE DM  P:   SSI, lantus   NEUROLOGIC AMS - r/t hypercarbia.  P:   RASS goal: -1 PRN fentanyl, ativan while on vent  Daily WUA    FAMILY  - Updates:  Son and daughter updated at length 9/1.   - Inter-disciplinary family meet or Palliative Care meeting due by:  9/8    Nickolas Madrid, NP 04/02/2015  11:06 AM Pager: [phone] or [phone]

## 2015-04-03 ENCOUNTER — Inpatient Hospital Stay (HOSPITAL_COMMUNITY): Payer: Medicare Other

## 2015-04-03 ENCOUNTER — Encounter (HOSPITAL_COMMUNITY)
Admission: EM | Disposition: A | Discharge status: Home Health | Payer: Self-pay | Source: Home / Self Care | Attending: Internal Medicine

## 2015-04-03 DIAGNOSIS — R778 Other specified abnormalities of plasma proteins: Secondary | ICD-10-CM | POA: Diagnosis present

## 2015-04-03 DIAGNOSIS — I469 Cardiac arrest, cause unspecified: Secondary | ICD-10-CM

## 2015-04-03 DIAGNOSIS — J9601 Acute respiratory failure with hypoxia: Secondary | ICD-10-CM

## 2015-04-03 DIAGNOSIS — R7989 Other specified abnormal findings of blood chemistry: Secondary | ICD-10-CM | POA: Diagnosis present

## 2015-04-03 DIAGNOSIS — I255 Ischemic cardiomyopathy: Secondary | ICD-10-CM | POA: Diagnosis present

## 2015-04-03 LAB — CULTURE, RESPIRATORY W GRAM STAIN: Culture: NORMAL

## 2015-04-03 LAB — CBC WITH DIFFERENTIAL/PLATELET
Basophils Absolute: 0 10*3/uL (ref 0.0–0.1)
Basophils Relative: 0 % (ref 0–1)
EOS ABS: 0 10*3/uL (ref 0.0–0.7)
Eosinophils Relative: 0 % (ref 0–5)
HEMATOCRIT: 37.1 % — AB (ref 39.0–52.0)
HEMOGLOBIN: 11.3 g/dL — AB (ref 13.0–17.0)
LYMPHS ABS: 0.5 10*3/uL — AB (ref 0.7–4.0)
LYMPHS PCT: 3 % — AB (ref 12–46)
MCH: 26.5 pg (ref 26.0–34.0)
MCHC: 30.5 g/dL (ref 30.0–36.0)
MCV: 87.1 fL (ref 78.0–100.0)
Monocytes Absolute: 0.2 10*3/uL (ref 0.1–1.0)
Monocytes Relative: 1 % — ABNORMAL LOW (ref 3–12)
NEUTROS ABS: 12.6 10*3/uL — AB (ref 1.7–7.7)
NEUTROS PCT: 95 % — AB (ref 43–77)
Platelets: 182 10*3/uL (ref 150–400)
RBC: 4.26 MIL/uL (ref 4.22–5.81)
RDW: 14.5 % (ref 11.5–15.5)
WBC: 13.2 10*3/uL — AB (ref 4.0–10.5)

## 2015-04-03 LAB — GLUCOSE, CAPILLARY
GLUCOSE-CAPILLARY: 174 mg/dL — AB (ref 65–99)
GLUCOSE-CAPILLARY: 169 mg/dL — AB (ref 65–99)
GLUCOSE-CAPILLARY: 188 mg/dL — AB (ref 65–99)
Glucose-Capillary: 172 mg/dL — ABNORMAL HIGH (ref 65–99)
Glucose-Capillary: 225 mg/dL — ABNORMAL HIGH (ref 65–99)
Glucose-Capillary: 219 mg/dL — ABNORMAL HIGH (ref 65–99)

## 2015-04-03 LAB — RENAL FUNCTION PANEL
ANION GAP: 10 (ref 5–15)
Albumin: 2.3 g/dL — ABNORMAL LOW (ref 3.5–5.0)
BUN: 23 mg/dL — ABNORMAL HIGH (ref 6–20)
CHLORIDE: 108 mmol/L (ref 101–111)
CO2: 22 mmol/L (ref 22–32)
CREATININE: 1.43 mg/dL — AB (ref 0.61–1.24)
Calcium: 8.2 mg/dL — ABNORMAL LOW (ref 8.9–10.3)
GFR, EST AFRICAN AMERICAN: 52 mL/min — AB (ref >60)
GFR, EST NON AFRICAN AMERICAN: 45 mL/min — AB (ref >60)
Glucose, Bld: 203 mg/dL — ABNORMAL HIGH (ref 65–99)
POTASSIUM: 3.6 mmol/L (ref 3.5–5.1)
Phosphorus: 1.4 mg/dL — ABNORMAL LOW (ref 2.5–4.6)
Sodium: 140 mmol/L (ref 135–145)

## 2015-04-03 LAB — TROPONIN I
TROPONIN I: 1.27 ng/mL — AB (ref <0.031)
TROPONIN I: 0.7 ng/mL — AB (ref <0.031)
TROPONIN I: 0.54 ng/mL — AB (ref <0.031)
Troponin I: 0.9 ng/mL (ref <0.031)

## 2015-04-03 LAB — CULTURE, RESPIRATORY

## 2015-04-03 LAB — MAGNESIUM: MAGNESIUM: 2 mg/dL (ref 1.7–2.4)

## 2015-04-03 SURGERY — ESOPHAGOGASTRODUODENOSCOPY (EGD) WITH PROPOFOL
Anesthesia: Monitor Anesthesia Care

## 2015-04-03 MED ORDER — SODIUM CHLORIDE 0.9 % IV SOLN
25.0000 ug/h | INTRAVENOUS | Status: DC
  Administered 2015-04-03: 25 ug/h via INTRAVENOUS
  Filled 2015-04-03: qty 50

## 2015-04-03 MED ORDER — FENTANYL CITRATE (PF) 100 MCG/2ML IJ SOLN
50.0000 ug | Freq: Once | INTRAMUSCULAR | Status: AC
  Administered 2015-04-03: 50 ug via INTRAVENOUS

## 2015-04-03 MED ORDER — POTASSIUM PHOSPHATES 15 MMOLE/5ML IV SOLN
30.0000 mmol | Freq: Once | INTRAVENOUS | Status: AC
  Administered 2015-04-03: 30 mmol via INTRAVENOUS
  Filled 2015-04-03: qty 10

## 2015-04-03 MED ORDER — PRO-STAT SUGAR FREE PO LIQD
30.0000 mL | Freq: Two times a day (BID) | ORAL | Status: AC
  Filled 2015-04-03 (×2): qty 30

## 2015-04-03 MED ORDER — PRO-STAT SUGAR FREE PO LIQD
30.0000 mL | Freq: Two times a day (BID) | ORAL | Status: DC
  Filled 2015-04-03 (×2): qty 30

## 2015-04-03 MED ORDER — VITAL AF 1.2 CAL PO LIQD
1000.0000 mL | ORAL | Status: DC
  Filled 2015-04-03 (×6): qty 1000

## 2015-04-03 MED ORDER — FENTANYL BOLUS VIA INFUSION
25.0000 ug | INTRAVENOUS | Status: DC | PRN
  Administered 2015-04-03: 25 ug via INTRAVENOUS
  Filled 2015-04-03: qty 25

## 2015-04-03 MED ORDER — VANCOMYCIN HCL IN DEXTROSE 750-5 MG/150ML-% IV SOLN
750.0000 mg | Freq: Two times a day (BID) | INTRAVENOUS | Status: DC
Start: 2015-04-03 — End: 2015-04-08
  Administered 2015-04-03 – 2015-04-08 (×11): 750 mg via INTRAVENOUS
  Filled 2015-04-03 (×13): qty 150

## 2015-04-03 NOTE — Progress Notes (Signed)
Multiple attempts to wean patient with little effort unless stimulated. However, when aroused patient is agitated. RN aware.

## 2015-04-03 NOTE — Progress Notes (Signed)
eLink Physician-Brief Progress Note Patient Name: Antonio Ray DOB: [birth year] MRN: [id number]   Date of Service  04/03/2015  HPI/Events of Note  Troponin elevated to 1.26. Echo reviewed- LVEF 10% - 15%. Diffuse hypokinesis. EKG shows old left branch block with no acute ST changes.   eICU Interventions  Serial troponin. Will start heparin drip and call cardiology if troponin continue to go up.    Intervention Category Major Interventions: Other:  [name] 04/03/2015, 1:53 AM

## 2015-04-03 NOTE — Progress Notes (Signed)
PULMONARY / CRITICAL CARE MEDICINE   Name: Antonio Ray MRN: [id number] DOB: 1933-11-10    ADMISSION DATE:  03/31/2015 CONSULTATION DATE:  9/1  REFERRING MD :  Earlie Counts (triad)   CHIEF COMPLAINT:  Respiratory arrest   INITIAL PRESENTATION:  81 yo male former smoker with multiple medical problems including CAD, chronic sCHF, COPD, CKD, DM. Recently admitted 8/20-8/23 with AECOPD and ?aspiration PNA.  Returned 8/30 with increased cough and SOB.  Admitted with HCAP v aspiration PNA.  Was progressing, tx from SDU to floor. On 9/1 pts son found him lethargic, agonal respirations.  He bagged him, code blue was called and pt intubated by anesthesia, tx to ICU.   STUDIES:  2D echo 9/1>>>  SIGNIFICANT EVENTS: 8/20-8/23 admit for AECOPD, aspiration PNA  8/30 readmitted    HISTORY OF PRESENT ILLNESS:  81 yo male former smoker with multiple medical problems including CAD, chronic sCHF, COPD, CKD, DM. Recently admitted 8/20-8/23 with AECOPD and ?aspiration PNA.  Returned 8/30 with increased cough and SOB.  Admitted with HCAP v aspiration PNA.  Was progressing, tx from SDU to floor. On 9/1 pts son found him lethargic, agonal respirations.  He bagged him, code blue was called and pt intubated by anesthesia, tx to ICU.   PAST MEDICAL HISTORY :   has a past medical history of GERD (gastroesophageal reflux disease); Coronary artery disease; Chronic systolic dysfunction of left ventricle; Hyperlipidemia; PVC's (premature ventricular contractions); BPH (benign prostatic hyperplasia); CHF (congestive heart failure); COPD (chronic obstructive pulmonary disease); Heart murmur; Pneumonia ("several times"); Type II diabetes mellitus; CKD (chronic kidney disease) stage 3, GFR 30-59 ml/min (06/08/2012); Skin cancer ("several"); Memory loss; Anginal pain; Dysrhythmia; Myocardial infarction (1994); AICD (automatic cardioverter/defibrillator) present (03/30/2011); and DOE (dyspnea on exertion).  has past surgical history  that includes Foot fracture surgery (Right, 1980's); Cholecystectomy (2/11); Cataract extraction w/ intraocular lens  implant, bilateral (08/2014-09/2014); Cardiac catheterization (01/11/2006); Coronary angioplasty (1994); Skin cancer excision ("several"); Esophageal manometry (N/A, 03/16/2015); and Insert / replace / remove pacemaker. Prior to Admission medications   Medication Sig Start Date End Date Taking? Authorizing Provider  aspirin 81 MG EC tablet Take 81 mg by mouth daily.     Yes Historical Provider, MD  budesonide-formoterol (SYMBICORT) 160-4.5 MCG/ACT inhaler Inhale 2 puffs into the lungs 2 (two) times daily. 09/19/14  Yes Juanito Doom, MD  Cholecalciferol (VITAMIN D PO) Take 2,000 Units by mouth daily.    Yes Historical Provider, MD  donepezil (ARICEPT) 10 MG tablet Take 10 mg by mouth every morning.   Yes Historical Provider, MD  fenofibrate (TRICOR) 145 MG tablet TAKE 1 TABLET BY MOUTH EVERY DAY 01/22/15  Yes Peter M Martinique, MD  furosemide (LASIX) 20 MG tablet TAKE 1 TABLET BY MOUTH EVERY DAY 09/19/14  Yes Peter M Martinique, MD  insulin glargine (LANTUS) 100 UNIT/ML injection Inject 24 Units into the skin every morning.    Yes Historical Provider, MD  losartan (COZAAR) 25 MG tablet Take 1 tablet (25 mg total) by mouth daily. 03/27/15  Yes Thurnell Lose, MD  memantine (NAMENDA) 10 MG tablet Take 1 tablet (10 mg total) by mouth 2 (two) times daily. 01/19/15  Yes Marcial Pacas, MD  Multiple Vitamins-Minerals (PRESERVISION AREDS 2) CAPS Take 1 capsule by mouth 2 (two) times daily.   Yes Historical Provider, MD  nitroGLYCERIN (NITRODUR - DOSED IN MG/24 HR) 0.2 mg/hr patch Place 0.2 mg onto the skin daily.   Yes Historical Provider, MD  pantoprazole (PROTONIX) 20 MG  tablet TAKE 1 TABLET BY MOUTH EVERY DAY 07/18/14  Yes Inda Castle, MD  pravastatin (PRAVACHOL) 40 MG tablet TAKE 1 TABLET BY MOUTH EVERY EVENING 02/20/15  Yes Peter M Martinique, MD  tiotropium (SPIRIVA) 18 MCG inhalation capsule Place 1  capsule (18 mcg total) into inhaler and inhale daily. 03/12/15  Yes Juanito Doom, MD  PROVENTIL HFA 108 (90 BASE) MCG/ACT inhaler Inhale 2 puffs into the lungs every 6 (six) hours as needed for wheezing or shortness of breath. As directed 05/24/13   Historical Provider, MD   Allergies  Allergen Reactions  . Codeine Other (See Comments)    Gets very angry, disoriented  . Dilaudid [Hydromorphone Hcl] Other (See Comments)    VERY AGITATED, HOSTILE  . Flomax [Tamsulosin Hcl] Shortness Of Breath  . Morphine And Related Other (See Comments)    VERY AGITATED, HOSTILE  . Sulfa Antibiotics Shortness Of Breath  . Beta Adrenergic Blockers     ? disorientation  . Carvedilol Other (See Comments)    DISORIENTATION  . Levofloxacin Other (See Comments)    unknown    FAMILY HISTORY:  indicated that his mother is deceased. He indicated that his father is deceased. He indicated that only one of his two sisters is alive.  SOCIAL HISTORY:  reports that he quit smoking about 22 years ago. His smoking use included Cigarettes. He has a 35 pack-year smoking history. He has never used smokeless tobacco. He reports that he drinks alcohol. He reports that he does not use illicit drugs.  REVIEW OF SYSTEMS:  Unable.  As per HPI obtained from records and pts son.   SUBJECTIVE:   VITAL SIGNS: Temp:  [97.3 F (36.3 C)-99.4 F (37.4 C)] 98.6 F (37 C) (09/02 0731) Pulse Rate:  [42-110] 55 (09/02 0800) Resp:  [4-25] 25 (09/02 0800) BP: (82-130)/(43-78) 121/60 mmHg (09/02 0800) SpO2:  [94 %-100 %] 99 % (09/02 0800) FiO2 (%):  [40 %-100 %] 40 % (09/02 0800) HEMODYNAMICS:   VENTILATOR SETTINGS: Vent Mode:  [-] PRVC FiO2 (%):  [40 %-100 %] 40 % Set Rate:  [16 bmp-25 bmp] 25 bmp Vt Set:  [500 mL-580 mL] 580 mL PEEP:  [5 cmH20] 5 cmH20 Plateau Pressure:  [20 cmH20-24 cmH20] 21 cmH20 INTAKE / OUTPUT:  Intake/Output Summary (Last 24 hours) at 04/03/15 0914 Last data filed at 04/03/15 0800  Gross per 24  hour  Intake 1732.2 ml  Output      0 ml  Net 1732.2 ml    PHYSICAL EXAMINATION: General:  Chronically ill appearing male, NAD post respiratory arrest  Neuro:  Opens eyes spontaneously, tracks, trying to pull at tube, MAE, gen weakness, does not follow commands  HEENT:  Mm moist, no sig JVD, ETT Cardiovascular:  s1s2 rrr, mild tachy Lungs:  resps even, non labored on vent, diffuse exp wheeze, diminished R  Abdomen:  Round, mildly distended, +bs  Musculoskeletal:  Warm and dry, scant BLE edema   LABS:  CBC  Recent Labs Lab 04/02/15 0649 04/02/15 1015 04/03/15 0143  WBC 21.3* 18.6* 13.2*  HGB 12.6* 13.5 11.3*  HCT 42.2 45.4 37.1*  PLT 274 226 182   Coag's  Recent Labs Lab 03/31/15 1937  APTT 30  INR 1.27   BMET  Recent Labs Lab 04/02/15 0649 04/02/15 1015 04/03/15 0130  NA 139 140 140  K 4.3 5.9* 3.6  CL 106 109 108  CO2 27 23 22   BUN 20 23* 23*  CREATININE 1.16 1.45* 1.43*  GLUCOSE 181* 206* 203*   Electrolytes  Recent Labs Lab 04/02/15 0649 04/02/15 1015 04/03/15 0130 04/03/15 0143  CALCIUM 8.5* 8.2* 8.2*  --   MG  --   --   --  2.0  PHOS  --   --  1.4*  --    Sepsis Markers  Recent Labs Lab 03/31/15 1937 03/31/15 2213 04/02/15 1015  LATICACIDVEN 1.4 1.6 1.6  PROCALCITON 2.83  --   --    ABG  Recent Labs Lab 04/02/15 1034 04/02/15 1348  PHART 7.141* 7.368  PCO2ART 78.6* 37.7  PO2ART 419.0* 157.0*   Liver Enzymes  Recent Labs Lab 03/31/15 1100 03/31/15 1937 04/02/15 1015 04/03/15 0130  AST 33 27 36  --   ALT 26 20 24   --   ALKPHOS 45 39 57  --   BILITOT 0.6 0.7 0.7  --   ALBUMIN 3.3* 2.8* 2.8* 2.3*   Cardiac Enzymes  Recent Labs Lab 04/02/15 2225 04/03/15 0130 04/03/15 0630  TROPONINI 1.26* 1.27* 0.90*   Glucose  Recent Labs Lab 04/02/15 1126 04/02/15 1526 04/02/15 1922 04/02/15 2356 04/03/15 0426 04/03/15 0730  GLUCAP 173* 139* 194* 172* 174* 169*    Imaging Dg Chest Port 1 View  04/02/2015    CLINICAL DATA:  Status post central line placement.  EXAM: PORTABLE CHEST - 1 VIEW  COMPARISON:  Single view of the chest earlier today.  FINDINGS: A new left IJ catheter is in place with the tip projecting over the mid to lower superior vena cava. Support apparatus is otherwise unchanged. There is no pneumothorax. The lungs are clear. Heart size is normal.  IMPRESSION: Left IJ catheter tip projects over the mid to lower superior vena cava. Negative for pneumothorax. No other change.   Electronically Signed   By: Inge Rise M.D.   On: 04/02/2015 19:13   Dg Chest Port 1 View  04/02/2015   CLINICAL DATA:  Hypoxia  EXAM: PORTABLE CHEST - 1 VIEW  COMPARISON:  Study obtained earlier in the day  FINDINGS: Endotracheal tube tip is 2.8 cm above the carina. Nasogastric tube tip and side port are in the stomach. Pacemaker leads are attached to the right atrium and right ventricle. No pneumothorax. There is no edema or consolidation. The heart is upper normal in size with pulmonary vascularity within normal limits. No adenopathy. There is atherosclerotic calcification in the aortic arch region.  IMPRESSION: Tube positions as described without pneumothorax. No edema or consolidation. The ill-defined opacity in the right base seen earlier in the day is not appreciable on this examination. Cardiac silhouette within normal limits.   Electronically Signed   By: Lowella Grip III M.D.   On: 04/02/2015 11:05   ASSESSMENT / PLAN:  PULMONARY OETT 9/1>>> Acute respiratory failure  AECOPD Recurrent aspiration PNA/pneumonitis v HCAP P:   Vent support - 8cc/kg  F/u CXR  F/u ABG Abx as below  Systemic steroids  BD's   CARDIOVASCULAR Hx CAD  sCHF - EF 35%  Hypotension - mild.  Baseline SBP ~100 per son  P:  Hold losartan for now  Troponin positive EF down to 10-15%, will call cards to evaluate.  RENAL CKD III - stable  P:   F/u chem. BMET in AM. Replace electrolytes as  indicated.  GASTROINTESTINAL Dysphagia with recurrent aspiration PNA  Achalasia  P:   GI following - tentative plan was for EGD with botox on 9/2 under MAC -- consider doing while intubated. PPI. TF per nutrition.  HEMATOLOGIC No active issue  P:  SQ heparin.  INFECTIOUS HCAP v recurrent aspiration PNA  P:   Vanc 9/1>>> Zosyn 9/1>>>  F/u on cultures.  ENDOCRINE DM  P:   SSI, lantus   NEUROLOGIC AMS - r/t hypercarbia.  P:   RASS goal: -1 PRN fentanyl, ativan while on vent  Daily WUA   FAMILY  - Updates:  Daughter updated bedside.  Continue full support for now, start TF, check CVP and cards to see.  Hold off diureses for now and continue IVF until TF are in place.  - Inter-disciplinary family meet or Palliative Care meeting due by:  9/8  The patient is critically ill with multiple organ systems failure and requires high complexity decision making for assessment and support, frequent evaluation and titration of therapies, application of advanced monitoring technologies and extensive interpretation of multiple databases.   Critical Care Time devoted to patient care services described in this note is  35  Minutes. This time reflects time of care of this signee Dr Jennet Maduro. This critical care time does not reflect procedure time, or teaching time or supervisory time of PA/NP/Med student/Med Resident etc but could involve care discussion time.  Rush Farmer, M.D. [facility] Pulmonary/Critical Care Medicine. Pager: [phone]. After hours pager: [phone].

## 2015-04-03 NOTE — Progress Notes (Signed)
ANTIBIOTIC CONSULT NOTE - FOLLOW UP  Pharmacy Consult for Vancomycin and Zosyn Indication: rule out sepsis and recurrent pneumonia/HCAP  Allergies  Allergen Reactions  . Codeine Other (See Comments)    Gets very angry, disoriented  . Dilaudid [Hydromorphone Hcl] Other (See Comments)    VERY AGITATED, HOSTILE  . Flomax [Tamsulosin Hcl] Shortness Of Breath  . Morphine And Related Other (See Comments)    VERY AGITATED, HOSTILE  . Sulfa Antibiotics Shortness Of Breath  . Beta Adrenergic Blockers     ? disorientation  . Carvedilol Other (See Comments)    DISORIENTATION  . Levofloxacin Other (See Comments)    unknown    Patient Measurements: Height: 5\' 10"  (177.8 cm) Weight: 191 lb 2.2 oz (86.7 kg) IBW/kg (Calculated) : 73  Vital Signs: Temp: 98.6 F (37 C) (09/02 0731) Temp Source: Oral (09/02 0731) BP: 125/63 mmHg (09/02 0900) Pulse Rate: 81 (09/02 0900)  Labs:  Recent Labs  04/02/15 0649 04/02/15 1015 04/03/15 0130 04/03/15 0143  WBC 21.3* 18.6*  --  13.2*  HGB 12.6* 13.5  --  11.3*  PLT 274 226  --  182  CREATININE 1.16 1.45* 1.43*  --    Estimated Creatinine Clearance: 43.2 mL/min (by C-G formula based on Cr of 1.43).  Microbiology: Recent Results (from the past 720 hour(s))  Blood culture (routine x 2)     Status: None   Collection Time: 03/21/15 10:45 AM  Result Value Ref Range Status   Specimen Description BLOOD RIGHT ANTECUBITAL  Final   Special Requests BOTTLES DRAWN AEROBIC AND ANAEROBIC 10ML  Final   Culture NO GROWTH 5 DAYS  Final   Report Status 03/26/2015 FINAL  Final  Blood culture (routine x 2)     Status: None   Collection Time: 03/21/15 11:00 AM  Result Value Ref Range Status   Specimen Description BLOOD LEFT ANTECUBITAL  Final   Special Requests BOTTLES DRAWN AEROBIC AND ANAEROBIC 10ML  Final   Culture NO GROWTH 5 DAYS  Final   Report Status 03/26/2015 FINAL  Final  Blood Culture (routine x 2)     Status: None (Preliminary result)   Collection Time: 03/31/15 11:00 AM  Result Value Ref Range Status   Specimen Description BLOOD LEFT ANTECUBITAL  Final   Special Requests BOTTLES DRAWN AEROBIC AND ANAEROBIC 5ML  Final   Culture NO GROWTH 2 DAYS  Final   Report Status PENDING  Incomplete  Blood Culture (routine x 2)     Status: None (Preliminary result)   Collection Time: 03/31/15 11:17 AM  Result Value Ref Range Status   Specimen Description BLOOD LEFT ARM  Final   Special Requests BOTTLES DRAWN AEROBIC AND ANAEROBIC 5ML  Final   Culture NO GROWTH 2 DAYS  Final   Report Status PENDING  Incomplete  Urine culture     Status: None   Collection Time: 03/31/15 11:40 AM  Result Value Ref Range Status   Specimen Description URINE, CLEAN CATCH  Final   Special Requests NONE  Final   Culture NO GROWTH 1 DAY  Final   Report Status 04/01/2015 FINAL  Final  MRSA PCR Screening     Status: None   Collection Time: 03/31/15  5:09 PM  Result Value Ref Range Status   MRSA by PCR NEGATIVE NEGATIVE Final    Comment:        The GeneXpert MRSA Assay (FDA approved for NASAL specimens only), is one component of a comprehensive MRSA colonization surveillance program.  It is not intended to diagnose MRSA infection nor to guide or monitor treatment for MRSA infections.   Culture, sputum-assessment     Status: None   Collection Time: 04/01/15  6:15 AM  Result Value Ref Range Status   Specimen Description SPUTUM  Final   Special Requests NONE  Final   Sputum evaluation   Final    THIS SPECIMEN IS ACCEPTABLE. RESPIRATORY CULTURE REPORT TO FOLLOW.   Report Status 04/01/2015 FINAL  Final  Culture, respiratory (NON-Expectorated)     Status: None (Preliminary result)   Collection Time: 04/01/15  6:15 AM  Result Value Ref Range Status   Specimen Description EXPECTORATED SPUTUM  Final   Special Requests NONE  Final   Gram Stain   Final    FEW WBC PRESENT, PREDOMINANTLY PMN RARE SQUAMOUS EPITHELIAL CELLS PRESENT RARE GRAM POSITIVE  COCCI IN PAIRS RARE GRAM NEGATIVE COCCI RARE GRAM NEGATIVE RODS    Culture   Final    NORMAL OROPHARYNGEAL FLORA Performed at [facility]    Report Status PENDING  Incomplete  MRSA PCR Screening     Status: None   Collection Time: 04/02/15 10:12 AM  Result Value Ref Range Status   MRSA by PCR NEGATIVE NEGATIVE Final    Comment:        The GeneXpert MRSA Assay (FDA approved for NASAL specimens only), is one component of a comprehensive MRSA colonization surveillance program. It is not intended to diagnose MRSA infection nor to guide or monitor treatment for MRSA infections.     Medical History: Past Medical History  Diagnosis Date  . GERD (gastroesophageal reflux disease)   . Coronary artery disease     History of remote anterior MI with PCI to LAD in 2006; most recent cath 2007, no intervention required  . Chronic systolic dysfunction of left ventricle     EF 30-35%, CLASS II - III SYMPTOMS; intolerant to Coreg  . Hyperlipidemia   . PVC's (premature ventricular contractions)   . BPH (benign prostatic hyperplasia)   . CHF (congestive heart failure)   . COPD (chronic obstructive pulmonary disease)   . Heart murmur     hx  . Pneumonia "several times"    aspiration pna with at least 3 admits for this 2016.   . Type II diabetes mellitus   . CKD (chronic kidney disease) stage 3, GFR 30-59 ml/min 06/08/2012  . Skin cancer "several"    "forearms; head"  . Memory loss   . Anginal pain   . Dysrhythmia   . Myocardial infarction 1994    ANTERIOR  . AICD (automatic cardioverter/defibrillator) present 03/30/2011    Analyze ST study patient  . DOE (dyspnea on exertion)     with heavy exertion   Assessment: 81 yr old male to be admitted with recurrent aspiration pneumonia.  Was just in the hospital 8/20-23/2016. Received Zosyn 8/20>>8/23, transitioned to Augmentin on 8/23 and transferred to floor. Now back in ICU with worsening respiratory status and concern for  repeat aspiration.   WBC remain elevated but down to 13.2. Afebrile. SCr 1.43. CrCl ~ 43 mL/min    Vanc 8/30>>8/31;9/1>>  Zosyn 8/30>>8/31;9/1>>   8/31 Sputum>>Normal flora   8/30 blood x 2 -NGTD   8/30 urine - NEGF  8/20 (last admit) blood x 2 - neg   Goal of Therapy:  Vancomycin trough level 15-20 mcg/ml appropriate Zosyn dose for renal function and infection  Plan:  -Increase Vancomycin to 750 mg IV Q 12 hours  -  Zosyn 3.375 gm IV q8hrs (each over 4 hours). -Follow renal function, culture data, progress.  Albertina Parr, PharmD., BCPS Clinical Pharmacist Pager [phone]

## 2015-04-03 NOTE — Consult Note (Signed)
Urology Consult  Referring physician: Fatima Blank Reason for referral: Inability to pass foley  Chief Complaint: Inability to pass foley  History of Present Illness: Elderly male with cardiac/resp difficulties; CRI; BPH; on flomax; no history from pt; 200 cc scan last night/; Modifying factors: There are no other modifying factors  Associated signs and symptoms: There are no other associated signs and symptoms Aggravating and relieving factors: There are no other aggravating or relieving factors Severity: Moderate Duration: Persistent  Past Medical History  Diagnosis Date  . GERD (gastroesophageal reflux disease)   . Coronary artery disease     History of remote anterior MI with PCI to LAD in 2006; most recent cath 2007, no intervention required  . Chronic systolic dysfunction of left ventricle     EF 30-35%, CLASS II - III SYMPTOMS; intolerant to Coreg  . Hyperlipidemia   . PVC's (premature ventricular contractions)   . BPH (benign prostatic hyperplasia)   . CHF (congestive heart failure)   . COPD (chronic obstructive pulmonary disease)   . Heart murmur     hx  . Pneumonia "several times"    aspiration pna with at least 3 admits for this 2016.   . Type II diabetes mellitus   . CKD (chronic kidney disease) stage 3, GFR 30-59 ml/min 06/08/2012  . Skin cancer "several"    "forearms; head"  . Memory loss   . Anginal pain   . Dysrhythmia   . Myocardial infarction 1994    ANTERIOR  . AICD (automatic cardioverter/defibrillator) present 03/30/2011    Analyze ST study patient  . DOE (dyspnea on exertion)     with heavy exertion   Past Surgical History  Procedure Laterality Date  . Foot fracture surgery Right 1980's  . Cholecystectomy  2/11  . Cataract extraction w/ intraocular lens  implant, bilateral  08/2014-09/2014  . Cardiac catheterization  01/11/2006    DEMONSTRATES AKINESIA OF THE DISTAL ANTERIOR WALL, DISTAL INFERIOR WALL AND AKINESIA OF THE APEX. THE BASAL SEGMENTS CONTRACT  WELL AND OVERALL EF 35%  . Coronary angioplasty  1994    TO THE LAD  . Skin cancer excision  "several"    "forearms, head"  . Esophageal manometry N/A 03/16/2015    Procedure: ESOPHAGEAL MANOMETRY (EM);  Surgeon: Jerene Bears, MD;  Location: [facility];  Service: Gastroenterology;  Laterality: N/A;  . Insert / replace / remove pacemaker      Medications: I have reviewed the patient's current medications. Allergies:  Allergies  Allergen Reactions  . Codeine Other (See Comments)    Gets very angry, disoriented  . Dilaudid [Hydromorphone Hcl] Other (See Comments)    VERY AGITATED, HOSTILE  . Flomax [Tamsulosin Hcl] Shortness Of Breath  . Morphine And Related Other (See Comments)    VERY AGITATED, HOSTILE  . Sulfa Antibiotics Shortness Of Breath  . Beta Adrenergic Blockers     ? disorientation  . Carvedilol Other (See Comments)    DISORIENTATION  . Levofloxacin Other (See Comments)    unknown    Family History  Problem Relation Age of Onset  . Stroke Father     Mother history unknown - never met her   Social History:  reports that he quit smoking about 22 years ago. His smoking use included Cigarettes. He has a 35 pack-year smoking history. He has never used smokeless tobacco. He reports that he drinks alcohol. He reports that he does not use illicit drugs.  ROS: All systems are reviewed and negative except  as noted. Rest negative  Physical Exam:  Vital signs in last 24 hours: Temp:  [98 F (36.7 C)-99.4 F (37.4 C)] 98.6 F (37 C) (09/02 1140) Pulse Rate:  [42-110] 80 (09/02 1100) Resp:  [4-25] 25 (09/02 1100) BP: (89-133)/(43-78) 124/73 mmHg (09/02 1100) SpO2:  [94 %-100 %] 100 % (09/02 1208) FiO2 (%):  [40 %] 40 % (09/02 1208)  Cardiovascular: Skin warm; not flushed Respiratory: Breaths quiet; no shortness of breath Abdomen: No masses Neurological: Normal sensation to touch Musculoskeletal: Normal motor function arms and legs Lymphatics: No inguinal  adenopathy Skin: No rashes Genitourinary:intubated/ ? Overdistended bladder; genitalia normal  Laboratory Data:  Results for orders placed or performed during the hospital encounter of 03/31/15 (from the past 72 hour(s))  I-Stat CG4 Lactic Acid, ED     Status: None   Collection Time: 03/31/15 12:52 PM  Result Value Ref Range   Lactic Acid, Venous 1.24 0.5 - 2.0 mmol/L  Glucose, capillary     Status: None   Collection Time: 03/31/15  4:48 PM  Result Value Ref Range   Glucose-Capillary 79 65 - 99 mg/dL  MRSA PCR Screening     Status: None   Collection Time: 03/31/15  5:09 PM  Result Value Ref Range   MRSA by PCR NEGATIVE NEGATIVE    Comment:        The GeneXpert MRSA Assay (FDA approved for NASAL specimens only), is one component of a comprehensive MRSA colonization surveillance program. It is not intended to diagnose MRSA infection nor to guide or monitor treatment for MRSA infections.   Cortisol     Status: None   Collection Time: 03/31/15  7:37 PM  Result Value Ref Range   Cortisol, Plasma 68.2 ug/dL    Comment: RESULTS CONFIRMED BY MANUAL DILUTION (NOTE) AM    6.7 - 22.6 ug/dL PM   <10.0       ug/dL   CBC with Differential     Status: Abnormal   Collection Time: 03/31/15  7:37 PM  Result Value Ref Range   WBC 25.7 (H) 4.0 - 10.5 K/uL   RBC 4.34 4.22 - 5.81 MIL/uL   Hemoglobin 11.5 (L) 13.0 - 17.0 g/dL   HCT 38.1 (L) 39.0 - 52.0 %   MCV 87.8 78.0 - 100.0 fL   MCH 26.5 26.0 - 34.0 pg   MCHC 30.2 30.0 - 36.0 g/dL   RDW 14.6 11.5 - 15.5 %   Platelets 222 150 - 400 K/uL   Neutrophils Relative % 87 (H) 43 - 77 %   Lymphocytes Relative 10 (L) 12 - 46 %   Monocytes Relative 3 3 - 12 %   Eosinophils Relative 0 0 - 5 %   Basophils Relative 0 0 - 1 %   Neutro Abs 22.3 (H) 1.7 - 7.7 K/uL   Lymphs Abs 2.6 0.7 - 4.0 K/uL   Monocytes Absolute 0.8 0.1 - 1.0 K/uL   Eosinophils Absolute 0.0 0.0 - 0.7 K/uL   Basophils Absolute 0.0 0.0 - 0.1 K/uL   Smear Review MORPHOLOGY  UNREMARKABLE   Comprehensive metabolic panel     Status: Abnormal   Collection Time: 03/31/15  7:37 PM  Result Value Ref Range   Sodium 140 135 - 145 mmol/L   Potassium 4.4 3.5 - 5.1 mmol/L   Chloride 107 101 - 111 mmol/L   CO2 25 22 - 32 mmol/L   Glucose, Bld 149 (H) 65 - 99 mg/dL   BUN 27 (H) 6 -  20 mg/dL   Creatinine, Ser 1.53 (H) 0.61 - 1.24 mg/dL   Calcium 8.1 (L) 8.9 - 10.3 mg/dL   Total Protein 6.1 (L) 6.5 - 8.1 g/dL   Albumin 2.8 (L) 3.5 - 5.0 g/dL   AST 27 15 - 41 U/L   ALT 20 17 - 63 U/L   Alkaline Phosphatase 39 38 - 126 U/L   Total Bilirubin 0.7 0.3 - 1.2 mg/dL   GFR calc non Af Amer 42 (L) >60 mL/min   GFR calc Af Amer 48 (L) >60 mL/min    Comment: (NOTE) The eGFR has been calculated using the CKD EPI equation. This calculation has not been validated in all clinical situations. eGFR's persistently <60 mL/min signify possible Chronic Kidney Disease.    Anion gap 8 5 - 15  Lactic acid, plasma     Status: None   Collection Time: 03/31/15  7:37 PM  Result Value Ref Range   Lactic Acid, Venous 1.4 0.5 - 2.0 mmol/L  Procalcitonin     Status: None   Collection Time: 03/31/15  7:37 PM  Result Value Ref Range   Procalcitonin 2.83 ng/mL    Comment:        Interpretation: PCT > 2 ng/mL: Systemic infection (sepsis) is likely, unless other causes are known. (NOTE)         ICU PCT Algorithm               Non ICU PCT Algorithm    ----------------------------     ------------------------------         PCT < 0.25 ng/mL                 PCT < 0.1 ng/mL     Stopping of antibiotics            Stopping of antibiotics       strongly encouraged.               strongly encouraged.    ----------------------------     ------------------------------       PCT level decrease by               PCT < 0.25 ng/mL       >= 80% from peak PCT       OR PCT 0.25 - 0.5 ng/mL          Stopping of antibiotics                                             encouraged.     Stopping of antibiotics            encouraged.    ----------------------------     ------------------------------       PCT level decrease by              PCT >= 0.25 ng/mL       < 80% from peak PCT        AND PCT >= 0.5 ng/mL            Continuing antibiotics                                               encouraged.  Continuing antibiotics            encouraged.    ----------------------------     ------------------------------     PCT level increase compared          PCT > 0.5 ng/mL         with peak PCT AND          PCT >= 0.5 ng/mL             Escalation of antibiotics                                          strongly encouraged.      Escalation of antibiotics        strongly encouraged.   Protime-INR     Status: Abnormal   Collection Time: 03/31/15  7:37 PM  Result Value Ref Range   Prothrombin Time 16.0 (H) 11.6 - 15.2 seconds   INR 1.27 0.00 - 1.49  APTT     Status: None   Collection Time: 03/31/15  7:37 PM  Result Value Ref Range   aPTT 30 24 - 37 seconds  Lactic acid, plasma     Status: None   Collection Time: 03/31/15 10:13 PM  Result Value Ref Range   Lactic Acid, Venous 1.6 0.5 - 2.0 mmol/L  Strep pneumoniae urinary antigen  (not at [facility])     Status: None   Collection Time: 03/31/15 10:34 PM  Result Value Ref Range   Strep Pneumo Urinary Antigen NEGATIVE NEGATIVE    Comment:        Infection due to S. pneumoniae cannot be absolutely ruled out since the antigen present may be below the detection limit of the test.   Legionella antigen, urine  (not at [facility])     Status: None   Collection Time: 03/31/15 10:35 PM  Result Value Ref Range   Specimen Description URINE, CLEAN CATCH    Special Requests NONE    Legionella Antigen, Urine      Negative for Legionella pneumophila serogroup 1                                                              Legionella pneumophila serogroup 1 antigen can be detected in urine within 2 to 3 days of infection and may persist even after treatment. This   assay does not detect other Legionella species or serogroups. Performed at [facility]    Report Status 04/02/2015 FINAL   Glucose, capillary     Status: Abnormal   Collection Time: 03/31/15 11:16 PM  Result Value Ref Range   Glucose-Capillary 161 (H) 65 - 99 mg/dL  Basic metabolic panel     Status: Abnormal   Collection Time: 04/01/15  2:48 AM  Result Value Ref Range   Sodium 140 135 - 145 mmol/L   Potassium 3.9 3.5 - 5.1 mmol/L   Chloride 109 101 - 111 mmol/L   CO2 26 22 - 32 mmol/L   Glucose, Bld 150 (H) 65 - 99 mg/dL   BUN 24 (H) 6 - 20 mg/dL   Creatinine, Ser 1.37 (H) 0.61 - 1.24 mg/dL   Calcium 8.1 (L) 8.9 -  10.3 mg/dL   GFR calc non Af Amer 47 (L) >60 mL/min   GFR calc Af Amer 55 (L) >60 mL/min    Comment: (NOTE) The eGFR has been calculated using the CKD EPI equation. This calculation has not been validated in all clinical situations. eGFR's persistently <60 mL/min signify possible Chronic Kidney Disease.    Anion gap 5 5 - 15  CBC     Status: Abnormal   Collection Time: 04/01/15  2:48 AM  Result Value Ref Range   WBC 21.5 (H) 4.0 - 10.5 K/uL   RBC 4.22 4.22 - 5.81 MIL/uL   Hemoglobin 11.4 (L) 13.0 - 17.0 g/dL   HCT 37.4 (L) 39.0 - 52.0 %   MCV 88.6 78.0 - 100.0 fL   MCH 27.0 26.0 - 34.0 pg   MCHC 30.5 30.0 - 36.0 g/dL   RDW 14.7 11.5 - 15.5 %   Platelets 208 150 - 400 K/uL  Glucose, capillary     Status: Abnormal   Collection Time: 04/01/15  5:56 AM  Result Value Ref Range   Glucose-Capillary 128 (H) 65 - 99 mg/dL  Culture, sputum-assessment     Status: None   Collection Time: 04/01/15  6:15 AM  Result Value Ref Range   Specimen Description SPUTUM    Special Requests NONE    Sputum evaluation      THIS SPECIMEN IS ACCEPTABLE. RESPIRATORY CULTURE REPORT TO FOLLOW.   Report Status 04/01/2015 FINAL   Culture, respiratory (NON-Expectorated)     Status: None   Collection Time: 04/01/15  6:15 AM  Result Value Ref Range   Specimen Description  EXPECTORATED SPUTUM    Special Requests NONE    Gram Stain      FEW WBC PRESENT, PREDOMINANTLY PMN RARE SQUAMOUS EPITHELIAL CELLS PRESENT RARE GRAM POSITIVE COCCI IN PAIRS RARE GRAM NEGATIVE COCCI RARE GRAM NEGATIVE RODS    Culture      NORMAL OROPHARYNGEAL FLORA Performed at [facility]    Report Status 04/03/2015 FINAL   Glucose, capillary     Status: Abnormal   Collection Time: 04/01/15 12:20 PM  Result Value Ref Range   Glucose-Capillary 196 (H) 65 - 99 mg/dL   Comment 1 Notify RN    Comment 2 Document in Chart   Glucose, capillary     Status: Abnormal   Collection Time: 04/01/15  5:15 PM  Result Value Ref Range   Glucose-Capillary 142 (H) 65 - 99 mg/dL  Glucose, capillary     Status: Abnormal   Collection Time: 04/01/15  9:33 PM  Result Value Ref Range   Glucose-Capillary 181 (H) 65 - 99 mg/dL  Basic metabolic panel     Status: Abnormal   Collection Time: 04/02/15  6:49 AM  Result Value Ref Range   Sodium 139 135 - 145 mmol/L   Potassium 4.3 3.5 - 5.1 mmol/L   Chloride 106 101 - 111 mmol/L   CO2 27 22 - 32 mmol/L   Glucose, Bld 181 (H) 65 - 99 mg/dL   BUN 20 6 - 20 mg/dL   Creatinine, Ser 1.16 0.61 - 1.24 mg/dL   Calcium 8.5 (L) 8.9 - 10.3 mg/dL   GFR calc non Af Amer 58 (L) >60 mL/min   GFR calc Af Amer >60 >60 mL/min    Comment: (NOTE) The eGFR has been calculated using the CKD EPI equation. This calculation has not been validated in all clinical situations. eGFR's persistently <60 mL/min signify possible Chronic Kidney Disease.  Anion gap 6 5 - 15  CBC     Status: Abnormal   Collection Time: 04/02/15  6:49 AM  Result Value Ref Range   WBC 21.3 (H) 4.0 - 10.5 K/uL   RBC 4.71 4.22 - 5.81 MIL/uL   Hemoglobin 12.6 (L) 13.0 - 17.0 g/dL   HCT 42.2 39.0 - 52.0 %   MCV 89.6 78.0 - 100.0 fL   MCH 26.8 26.0 - 34.0 pg   MCHC 29.9 (L) 30.0 - 36.0 g/dL   RDW 14.7 11.5 - 15.5 %   Platelets 274 150 - 400 K/uL  Glucose, capillary     Status: Abnormal    Collection Time: 04/02/15  7:57 AM  Result Value Ref Range   Glucose-Capillary 209 (H) 65 - 99 mg/dL   Comment 1 Notify RN    Comment 2 Call MD NNP PA CNM   Glucose, capillary     Status: Abnormal   Collection Time: 04/02/15  9:30 AM  Result Value Ref Range   Glucose-Capillary 173 (H) 65 - 99 mg/dL  Glucose, capillary     Status: Abnormal   Collection Time: 04/02/15 10:07 AM  Result Value Ref Range   Glucose-Capillary 201 (H) 65 - 99 mg/dL  MRSA PCR Screening     Status: None   Collection Time: 04/02/15 10:12 AM  Result Value Ref Range   MRSA by PCR NEGATIVE NEGATIVE    Comment:        The GeneXpert MRSA Assay (FDA approved for NASAL specimens only), is one component of a comprehensive MRSA colonization surveillance program. It is not intended to diagnose MRSA infection nor to guide or monitor treatment for MRSA infections.   CBC with Differential/Platelet     Status: Abnormal   Collection Time: 04/02/15 10:15 AM  Result Value Ref Range   WBC 18.6 (H) 4.0 - 10.5 K/uL   RBC 4.97 4.22 - 5.81 MIL/uL   Hemoglobin 13.5 13.0 - 17.0 g/dL   HCT 45.4 39.0 - 52.0 %   MCV 91.3 78.0 - 100.0 fL   MCH 27.2 26.0 - 34.0 pg   MCHC 29.7 (L) 30.0 - 36.0 g/dL   RDW 14.8 11.5 - 15.5 %   Platelets 226 150 - 400 K/uL   Neutrophils Relative % 87 (H) 43 - 77 %   Neutro Abs 16.1 (H) 1.7 - 7.7 K/uL   Lymphocytes Relative 7 (L) 12 - 46 %   Lymphs Abs 1.4 0.7 - 4.0 K/uL   Monocytes Relative 6 3 - 12 %   Monocytes Absolute 1.1 (H) 0.1 - 1.0 K/uL   Eosinophils Relative 0 0 - 5 %   Eosinophils Absolute 0.0 0.0 - 0.7 K/uL   Basophils Relative 0 0 - 1 %   Basophils Absolute 0.0 0.0 - 0.1 K/uL  Comprehensive metabolic panel     Status: Abnormal   Collection Time: 04/02/15 10:15 AM  Result Value Ref Range   Sodium 140 135 - 145 mmol/L   Potassium 5.9 (H) 3.5 - 5.1 mmol/L    Comment: HEMOLYSIS AT THIS LEVEL MAY AFFECT RESULT   Chloride 109 101 - 111 mmol/L   CO2 23 22 - 32 mmol/L   Glucose, Bld  206 (H) 65 - 99 mg/dL   BUN 23 (H) 6 - 20 mg/dL   Creatinine, Ser 1.45 (H) 0.61 - 1.24 mg/dL   Calcium 8.2 (L) 8.9 - 10.3 mg/dL   Total Protein 6.2 (L) 6.5 - 8.1 g/dL   Albumin 2.8 (  L) 3.5 - 5.0 g/dL   AST 36 15 - 41 U/L   ALT 24 17 - 63 U/L   Alkaline Phosphatase 57 38 - 126 U/L   Total Bilirubin 0.7 0.3 - 1.2 mg/dL   GFR calc non Af Amer 44 (L) >60 mL/min   GFR calc Af Amer 51 (L) >60 mL/min    Comment: (NOTE) The eGFR has been calculated using the CKD EPI equation. This calculation has not been validated in all clinical situations. eGFR's persistently <60 mL/min signify possible Chronic Kidney Disease.    Anion gap 8 5 - 15  Lactic acid, plasma     Status: None   Collection Time: 04/02/15 10:15 AM  Result Value Ref Range   Lactic Acid, Venous 1.6 0.5 - 2.0 mmol/L  Brain natriuretic peptide     Status: Abnormal   Collection Time: 04/02/15 10:15 AM  Result Value Ref Range   B Natriuretic Peptide 906.6 (H) 0.0 - 100.0 pg/mL  Troponin I (q 6hr x 3)     Status: Abnormal   Collection Time: 04/02/15 10:15 AM  Result Value Ref Range   Troponin I 0.14 (H) <0.031 ng/mL    Comment:        PERSISTENTLY INCREASED TROPONIN VALUES IN THE RANGE OF 0.04-0.49 ng/mL CAN BE SEEN IN:       -UNSTABLE ANGINA       -CONGESTIVE HEART FAILURE       -MYOCARDITIS       -CHEST TRAUMA       -ARRYHTHMIAS       -LATE PRESENTING MYOCARDIAL INFARCTION       -COPD   CLINICAL FOLLOW-UP RECOMMENDED.   I-STAT 3, arterial blood gas (G3+)     Status: Abnormal   Collection Time: 04/02/15 10:34 AM  Result Value Ref Range   pH, Arterial 7.141 (LL) 7.350 - 7.450   pCO2 arterial 78.6 (HH) 35.0 - 45.0 mmHg   pO2, Arterial 419.0 (H) 80.0 - 100.0 mmHg   Bicarbonate 27.0 (H) 20.0 - 24.0 mEq/L   TCO2 29 0 - 100 mmol/L   O2 Saturation 100.0 %   Acid-base deficit 4.0 (H) 0.0 - 2.0 mmol/L   Patient temperature 97.9 F    Collection site RADIAL, ALLEN'S TEST ACCEPTABLE    Sample type ARTERIAL    Comment  NOTIFIED PHYSICIAN   Glucose, capillary     Status: Abnormal   Collection Time: 04/02/15 11:26 AM  Result Value Ref Range   Glucose-Capillary 173 (H) 65 - 99 mg/dL  I-STAT 3, arterial blood gas (G3+)     Status: Abnormal   Collection Time: 04/02/15  1:48 PM  Result Value Ref Range   pH, Arterial 7.368 7.350 - 7.450   pCO2 arterial 37.7 35.0 - 45.0 mmHg   pO2, Arterial 157.0 (H) 80.0 - 100.0 mmHg   Bicarbonate 21.9 20.0 - 24.0 mEq/L   TCO2 23 0 - 100 mmol/L   O2 Saturation 99.0 %   Acid-base deficit 3.0 (H) 0.0 - 2.0 mmol/L   Patient temperature 97.3 F    Collection site RADIAL, ALLEN'S TEST ACCEPTABLE    Drawn by Operator    Sample type ARTERIAL   Glucose, capillary     Status: Abnormal   Collection Time: 04/02/15  3:26 PM  Result Value Ref Range   Glucose-Capillary 139 (H) 65 - 99 mg/dL  Glucose, capillary     Status: Abnormal   Collection Time: 04/02/15  7:22 PM  Result Value Ref Range  Glucose-Capillary 194 (H) 65 - 99 mg/dL  Troponin I (q 6hr x 3)     Status: Abnormal   Collection Time: 04/02/15 10:25 PM  Result Value Ref Range   Troponin I 1.26 (HH) <0.031 ng/mL    Comment:        POSSIBLE MYOCARDIAL ISCHEMIA. SERIAL TESTING RECOMMENDED. CRITICAL RESULT CALLED TO, READ BACK BY AND VERIFIED WITH: TOOMES J,RN 04/02/15 2344 WAYK   Glucose, capillary     Status: Abnormal   Collection Time: 04/02/15 11:56 PM  Result Value Ref Range   Glucose-Capillary 172 (H) 65 - 99 mg/dL   Comment 1 Notify RN    Comment 2 Document in Chart   Renal function panel     Status: Abnormal   Collection Time: 04/03/15  1:30 AM  Result Value Ref Range   Sodium 140 135 - 145 mmol/L   Potassium 3.6 3.5 - 5.1 mmol/L    Comment: DELTA CHECK NOTED   Chloride 108 101 - 111 mmol/L   CO2 22 22 - 32 mmol/L   Glucose, Bld 203 (H) 65 - 99 mg/dL   BUN 23 (H) 6 - 20 mg/dL   Creatinine, Ser 1.43 (H) 0.61 - 1.24 mg/dL   Calcium 8.2 (L) 8.9 - 10.3 mg/dL   Phosphorus 1.4 (L) 2.5 - 4.6 mg/dL    Albumin 2.3 (L) 3.5 - 5.0 g/dL   GFR calc non Af Amer 45 (L) >60 mL/min   GFR calc Af Amer 52 (L) >60 mL/min    Comment: (NOTE) The eGFR has been calculated using the CKD EPI equation. This calculation has not been validated in all clinical situations. eGFR's persistently <60 mL/min signify possible Chronic Kidney Disease.    Anion gap 10 5 - 15  Troponin I     Status: Abnormal   Collection Time: 04/03/15  1:30 AM  Result Value Ref Range   Troponin I 1.27 (HH) <0.031 ng/mL    Comment:        POSSIBLE MYOCARDIAL ISCHEMIA. SERIAL TESTING RECOMMENDED. CRITICAL VALUE NOTED.  VALUE IS CONSISTENT WITH PREVIOUSLY REPORTED AND CALLED VALUE.   CBC with Differential/Platelet     Status: Abnormal   Collection Time: 04/03/15  1:43 AM  Result Value Ref Range   WBC 13.2 (H) 4.0 - 10.5 K/uL   RBC 4.26 4.22 - 5.81 MIL/uL   Hemoglobin 11.3 (L) 13.0 - 17.0 g/dL   HCT 37.1 (L) 39.0 - 52.0 %   MCV 87.1 78.0 - 100.0 fL   MCH 26.5 26.0 - 34.0 pg   MCHC 30.5 30.0 - 36.0 g/dL   RDW 14.5 11.5 - 15.5 %   Platelets 182 150 - 400 K/uL   Neutrophils Relative % 95 (H) 43 - 77 %   Neutro Abs 12.6 (H) 1.7 - 7.7 K/uL   Lymphocytes Relative 3 (L) 12 - 46 %   Lymphs Abs 0.5 (L) 0.7 - 4.0 K/uL   Monocytes Relative 1 (L) 3 - 12 %   Monocytes Absolute 0.2 0.1 - 1.0 K/uL   Eosinophils Relative 0 0 - 5 %   Eosinophils Absolute 0.0 0.0 - 0.7 K/uL   Basophils Relative 0 0 - 1 %   Basophils Absolute 0.0 0.0 - 0.1 K/uL  Magnesium     Status: None   Collection Time: 04/03/15  1:43 AM  Result Value Ref Range   Magnesium 2.0 1.7 - 2.4 mg/dL  Glucose, capillary     Status: Abnormal   Collection Time: 04/03/15  4:26 AM  Result Value Ref Range   Glucose-Capillary 174 (H) 65 - 99 mg/dL   Comment 1 Notify RN    Comment 2 Document in Chart   Troponin I (q 6hr x 3)     Status: Abnormal   Collection Time: 04/03/15  6:30 AM  Result Value Ref Range   Troponin I 0.90 (HH) <0.031 ng/mL    Comment:        POSSIBLE  MYOCARDIAL ISCHEMIA. SERIAL TESTING RECOMMENDED. CRITICAL VALUE NOTED.  VALUE IS CONSISTENT WITH PREVIOUSLY REPORTED AND CALLED VALUE.   Glucose, capillary     Status: Abnormal   Collection Time: 04/03/15  7:30 AM  Result Value Ref Range   Glucose-Capillary 169 (H) 65 - 99 mg/dL   Recent Results (from the past 240 hour(s))  Blood Culture (routine x 2)     Status: None (Preliminary result)   Collection Time: 03/31/15 11:00 AM  Result Value Ref Range Status   Specimen Description BLOOD LEFT ANTECUBITAL  Final   Special Requests BOTTLES DRAWN AEROBIC AND ANAEROBIC 5ML  Final   Culture NO GROWTH 2 DAYS  Final   Report Status PENDING  Incomplete  Blood Culture (routine x 2)     Status: None (Preliminary result)   Collection Time: 03/31/15 11:17 AM  Result Value Ref Range Status   Specimen Description BLOOD LEFT ARM  Final   Special Requests BOTTLES DRAWN AEROBIC AND ANAEROBIC 5ML  Final   Culture NO GROWTH 2 DAYS  Final   Report Status PENDING  Incomplete  Urine culture     Status: None   Collection Time: 03/31/15 11:40 AM  Result Value Ref Range Status   Specimen Description URINE, CLEAN CATCH  Final   Special Requests NONE  Final   Culture NO GROWTH 1 DAY  Final   Report Status 04/01/2015 FINAL  Final  MRSA PCR Screening     Status: None   Collection Time: 03/31/15  5:09 PM  Result Value Ref Range Status   MRSA by PCR NEGATIVE NEGATIVE Final    Comment:        The GeneXpert MRSA Assay (FDA approved for NASAL specimens only), is one component of a comprehensive MRSA colonization surveillance program. It is not intended to diagnose MRSA infection nor to guide or monitor treatment for MRSA infections.   Culture, sputum-assessment     Status: None   Collection Time: 04/01/15  6:15 AM  Result Value Ref Range Status   Specimen Description SPUTUM  Final   Special Requests NONE  Final   Sputum evaluation   Final    THIS SPECIMEN IS ACCEPTABLE. RESPIRATORY CULTURE REPORT TO  FOLLOW.   Report Status 04/01/2015 FINAL  Final  Culture, respiratory (NON-Expectorated)     Status: None   Collection Time: 04/01/15  6:15 AM  Result Value Ref Range Status   Specimen Description EXPECTORATED SPUTUM  Final   Special Requests NONE  Final   Gram Stain   Final    FEW WBC PRESENT, PREDOMINANTLY PMN RARE SQUAMOUS EPITHELIAL CELLS PRESENT RARE GRAM POSITIVE COCCI IN PAIRS RARE GRAM NEGATIVE COCCI RARE GRAM NEGATIVE RODS    Culture   Final    NORMAL OROPHARYNGEAL FLORA Performed at [facility]    Report Status 04/03/2015 FINAL  Final  MRSA PCR Screening     Status: None   Collection Time: 04/02/15 10:12 AM  Result Value Ref Range Status   MRSA by PCR NEGATIVE NEGATIVE Final  Comment:        The GeneXpert MRSA Assay (FDA approved for NASAL specimens only), is one component of a comprehensive MRSA colonization surveillance program. It is not intended to diagnose MRSA infection nor to guide or monitor treatment for MRSA infections.    Creatinine:  Recent Labs  03/31/15 1100 03/31/15 1937 04/01/15 0248 04/02/15 0649 04/02/15 1015 04/03/15 0130  CREATININE 1.69* 1.53* 1.37* 1.16 1.45* 1.43*    Xrays: See report/chart none  Impression/Assessment:  Inability to pass catheter; pad was wet   Plan:  14 fr cath went in well with mild resistance mid penile urethra Clear urine to flush  Remove foley day before d./c home IF patient is feeling well and mobile See prn  Zaia Carre A 04/03/2015, 12:18 PM

## 2015-04-03 NOTE — Progress Notes (Signed)
Preliminary results by tech - Bilateral Venous Duplex Completed. Negative for deep vein and superficial vein thrombosis in the lower extremities.  Oda Cogan, BS, RDMS, RVT

## 2015-04-03 NOTE — Progress Notes (Signed)
175 mL of Fentanyl wasted in sink with Candelaria Celeste

## 2015-04-03 NOTE — Consult Note (Signed)
CARDIOLOGY CONSULT NOTE   Patient ID: JOSHUE BADAL MRN: [id number] DOB/AGE: 12/30/1933 81 y.o.  Admit Date: 03/31/2015  Primary Physician: [name], MD  Primary Cardiologist     Peter Martinique   Clinical Summary Mr. Vanderweele is a 81 y.o.male. He is admitted with acute respiratory failure. He had been hospitalized recently with aspiration pneumonia. Currently he is intubated.  The patient has known coronary artery disease with an ischemic cardiomyopathy. He has an ICD in place. He saw Dr. Martinique last in the office in August, 2016. He was clinically stable at that time. 2-D echo had been done in June, 2016. The report mentions an ejection fraction in the range of 35-40%. From my review I believe that in June his EF was closer to 30%. However the current echo this admission shows that his left ventricular function is definitely worse then June, 2016. The EF currently is in the 15% range. There is diffuse hypokinesis. There is mild troponin elevation this admission. There is no diagnostic EKG change.   Allergies  Allergen Reactions  . Codeine Other (See Comments)    Gets very angry, disoriented  . Dilaudid [Hydromorphone Hcl] Other (See Comments)    VERY AGITATED, HOSTILE  . Flomax [Tamsulosin Hcl] Shortness Of Breath  . Morphine And Related Other (See Comments)    VERY AGITATED, HOSTILE  . Sulfa Antibiotics Shortness Of Breath  . Beta Adrenergic Blockers     ? disorientation  . Carvedilol Other (See Comments)    DISORIENTATION  . Levofloxacin Other (See Comments)    unknown    Medications Scheduled Medications: . antiseptic oral rinse  7 mL Mouth Rinse QID  . aspirin  81 mg Per Tube Daily  . chlorhexidine gluconate  15 mL Mouth Rinse BID  . heparin  5,000 Units Subcutaneous 3 times per day  . insulin aspart  0-9 Units Subcutaneous 6 times per day  . insulin glargine  8 Units Subcutaneous q morning - 10a  . ipratropium-albuterol  3 mL Nebulization Q4H  .  methylPREDNISolone (SOLU-MEDROL) injection  40 mg Intravenous Q6H  . pantoprazole sodium  40 mg Per Tube Daily  . piperacillin-tazobactam (ZOSYN)  IV  3.375 g Intravenous Q8H  . potassium phosphate IVPB (mmol)  30 mmol Intravenous Once  . pravastatin  40 mg Per Tube QPM  . vancomycin  750 mg Intravenous Q12H     Infusions: . sodium chloride 10 mL/hr at 04/03/15 0052  . sodium chloride 50 mL/hr at 04/03/15 0053  . fentaNYL infusion INTRAVENOUS 50 mcg/hr (04/03/15 1000)     PRN Medications:  acetaminophen (TYLENOL) oral liquid 160 mg/5 mL, fentaNYL, fentaNYL (SUBLIMAZE) injection, LORazepam   Past Medical History  Diagnosis Date  . GERD (gastroesophageal reflux disease)   . Coronary artery disease     History of remote anterior MI with PCI to LAD in 2006; most recent cath 2007, no intervention required  . Chronic systolic dysfunction of left ventricle     EF 30-35%, CLASS II - III SYMPTOMS; intolerant to Coreg  . Hyperlipidemia   . PVC's (premature ventricular contractions)   . BPH (benign prostatic hyperplasia)   . CHF (congestive heart failure)   . COPD (chronic obstructive pulmonary disease)   . Heart murmur     hx  . Pneumonia "several times"    aspiration pna with at least 3 admits for this 2016.   . Type II diabetes mellitus   . CKD (chronic kidney disease) stage 3, GFR  30-59 ml/min 06/08/2012  . Skin cancer "several"    "forearms; head"  . Memory loss   . Anginal pain   . Dysrhythmia   . Myocardial infarction 1994    ANTERIOR  . AICD (automatic cardioverter/defibrillator) present 03/30/2011    Analyze ST study patient  . DOE (dyspnea on exertion)     with heavy exertion    Past Surgical History  Procedure Laterality Date  . Foot fracture surgery Right 1980's  . Cholecystectomy  2/11  . Cataract extraction w/ intraocular lens  implant, bilateral  08/2014-09/2014  . Cardiac catheterization  01/11/2006    DEMONSTRATES AKINESIA OF THE DISTAL ANTERIOR WALL, DISTAL  INFERIOR WALL AND AKINESIA OF THE APEX. THE BASAL SEGMENTS CONTRACT WELL AND OVERALL EF 35%  . Coronary angioplasty  1994    TO THE LAD  . Skin cancer excision  "several"    "forearms, head"  . Esophageal manometry N/A 03/16/2015    Procedure: ESOPHAGEAL MANOMETRY (EM);  Surgeon: Jerene Bears, MD;  Location: [facility];  Service: Gastroenterology;  Laterality: N/A;  . Insert / replace / remove pacemaker      Family History  Problem Relation Age of Onset  . Stroke Father     Mother history unknown - never met her    Social History Mr. Brinkmeyer reports that he quit smoking about 22 years ago. His smoking use included Cigarettes. He has a 35 pack-year smoking history. He has never used smokeless tobacco. Mr. Goffe reports that he drinks alcohol.  Review of Systems The patient currently is intubated and cannot respond.  Physical Examination Blood pressure 133/59, pulse 89, temperature 98.6 F (37 C), temperature source Oral, resp. rate 25, height _0  (1.778 m), weight 191 lb 2.2 oz (86.7 kg), SpO2 100 %.  Intake/Output Summary (Last 24 hours) at 04/03/15 1034 Last data filed at 04/03/15 1018  Gross per 24 hour  Intake 1871.62 ml  Output      0 ml  Net 1871.62 ml    The patient is intubated and cannot respond. Lungs reveal scattered rhonchi. Cardiac exam reveals distant heart sounds. The abdomen is soft. There is no significant peripheral edema. The remainder of his physical exam is limited. Prior Cardiac Testing/Procedures  Lab Results  Basic Metabolic Panel:  Recent Labs Lab 03/31/15 1937 04/01/15 0248 04/02/15 0649 04/02/15 1015 04/03/15 0130 04/03/15 0143  NA 140 140 139 140 140  --   K 4.4 3.9 4.3 5.9* 3.6  --   CL 107 109 106 109 108  --   CO2 _1 --   GLUCOSE 149* 150* 181* 206* 203*  --   BUN 27* 24* 20 23* 23*  --   CREATININE 1.53* 1.37* 1.16 1.45* 1.43*  --   CALCIUM 8.1* 8.1* 8.5* 8.2* 8.2*  --   MG  --   --   --   --   --  2.0  PHOS  --    --   --   --  1.4*  --     Liver Function Tests:  Recent Labs Lab 03/31/15 1100 03/31/15 1937 04/02/15 1015 04/03/15 0130  AST 33 27 36  --   ALT _2 --   ALKPHOS 45 39 57  --   BILITOT 0.6 0.7 0.7  --   PROT 7.3 6.1* 6.2*  --   ALBUMIN 3.3* 2.8* 2.8* 2.3*    CBC:  Recent Labs Lab 03/31/15 1100 03/31/15  1937 04/01/15 0248 04/02/15 0649 04/02/15 1015 04/03/15 0143  WBC 18.4* 25.7* 21.5* 21.3* 18.6* 13.2*  NEUTROABS 16.7* 22.3*  --   --  16.1* 12.6*  HGB 13.0 11.5* 11.4* 12.6* 13.5 11.3*  HCT 43.1 38.1* 37.4* 42.2 45.4 37.1*  MCV 88.1 87.8 88.6 89.6 91.3 87.1  PLT 257 222 208 274 226 182    Cardiac Enzymes:  Recent Labs Lab 04/02/15 1015 04/02/15 2225 04/03/15 0130 04/03/15 0630  TROPONINI 0.14* 1.26* 1.27* 0.90*    BNP: Invalid input(s): POCBNP   Radiology: Dg Chest Port 1 View  04/02/2015   CLINICAL DATA:  Status post central line placement.  EXAM: PORTABLE CHEST - 1 VIEW  COMPARISON:  Single view of the chest earlier today.  FINDINGS: A new left IJ catheter is in place with the tip projecting over the mid to lower superior vena cava. Support apparatus is otherwise unchanged. There is no pneumothorax. The lungs are clear. Heart size is normal.  IMPRESSION: Left IJ catheter tip projects over the mid to lower superior vena cava. Negative for pneumothorax. No other change.   Electronically Signed   By: Inge Rise M.D.   On: 04/02/2015 19:13   Dg Chest Port 1 View  04/02/2015   CLINICAL DATA:  Hypoxia  EXAM: PORTABLE CHEST - 1 VIEW  COMPARISON:  Study obtained earlier in the day  FINDINGS: Endotracheal tube tip is 2.8 cm above the carina. Nasogastric tube tip and side port are in the stomach. Pacemaker leads are attached to the right atrium and right ventricle. No pneumothorax. There is no edema or consolidation. The heart is upper normal in size with pulmonary vascularity within normal limits. No adenopathy. There is atherosclerotic calcification in  the aortic arch region.  IMPRESSION: Tube positions as described without pneumothorax. No edema or consolidation. The ill-defined opacity in the right base seen earlier in the day is not appreciable on this examination. Cardiac silhouette within normal limits.   Electronically Signed   By: Lowella Grip III M.D.   On: 04/02/2015 11:05   Dg Chest Port 1 View  04/02/2015   CLINICAL DATA:  Dyspnea, onset tonight.  EXAM: PORTABLE CHEST - 1 VIEW  COMPARISON:  04/01/2015  FINDINGS: There is developing basilar opacity on the right which could represent infectious infiltrate. No large effusion is evident. Mild interstitial and vascular prominence is present.  IMPRESSION: Developing patchy right base opacity which could represent pneumonia. Noninfectious atelectasis is also possibility.   Electronically Signed   By: Andreas Newport M.D.   On: 04/02/2015 05:23     ECG:  I have reviewed the current and old EKGs. There is sinus rhythm. There is an interventricular conduction delay.  Telemetry:   I have reviewed telemetry today April 03, 2015. There is sinus rhythm. There is intermittent pacing. There are also scattered PVCs.   Impression and Recommendations    Sepsis     The critical care team is managing the patient's pneumonia/sepsis/respiratory failure.    Diabetes mellitus type II, non insulin dependent   COPD (chronic obstructive pulmonary disease)   CKD (chronic kidney disease) stage 3, GFR 30-59 ml/min    Automatic implantable cardioverter-defibrillator in situ     The patient has an ICD in place. It has been followed in the office carefully.    CAD     There is mild troponin elevation. There is no proof that he's had a significant MI. His left ventricular function is definitely worse than June, 2016. This  is probably related to his overall illness.    Cardiomyopathy, ischemic     We know that the patient's ejection fraction was in the 30% range in June, 2016. It is definitely worse  during this admission. Currently his EF is in the range of 15%. There is mild troponin elevation. I doubt that he's had a significant MI. I suspect that his worsened LV function is related to his overall critical illness at this time. His volume status will be managed carefully by the critical care team while his respiratory failure is treated. When he stabilizes, repeat echo can be done to reassess his LV function. Unfortunately, with his current situation, we cannot push any of the medications that would be optimal for his left ventricular dysfunction.    Elevated troponin   History upon elevation is probably from demand ischemia.  The overall recommendation at this time is expert management of his respiratory failure and volume status by the critical care team. When he does improve, we can reassess his cardiac status and make further recommendations.   Daryel November, MD 04/03/2015, 10:34 AM

## 2015-04-03 NOTE — Progress Notes (Signed)
Patient self extubated, vent turned off. Patient placed on 4 Lpm nasal cannula. No signs of dyspnea or stridor. RN at bedside. Will continue to monitor.

## 2015-04-03 NOTE — Progress Notes (Signed)
Called to room, patient self extubated.  No s/s of pain or distress.  Placed patient on 4L [place], patient states he feels a little short of breath, but "not bad".  Patient denies pain, will remain with restraints d/t possibility of other equipment being pulled off, patient states he does not know why he pulled out tube.

## 2015-04-03 NOTE — Progress Notes (Signed)
Daily Rounding Note  04/03/2015, 8:50 AM  LOS: 3 days   SUBJECTIVE:       Remains intubated on vent, agitated when attempts made to wean.    OBJECTIVE:         Vital signs in last 24 hours:    Temp:  [97.3 F (36.3 C)-99.4 F (37.4 C)] 98.6 F (37 C) (09/02 0731) Pulse Rate:  [42-110] 55 (09/02 0800) Resp:  [4-25] 25 (09/02 0800) BP: (82-130)/(43-78) 121/60 mmHg (09/02 0800) SpO2:  [94 %-100 %] 99 % (09/02 0800) FiO2 (%):  [40 %-100 %] 40 % (09/02 0800) Last BM Date: 04/01/15 Filed Weights   03/31/15 1215 03/31/15 1639  Weight: 184 lb 4.9 oz (83.6 kg) 191 lb 2.2 oz (86.7 kg)   General: unresponsive on vent   Heart: RRR Chest: clear in front Abdomen: soft, active BS.  NT, ND  Extremities: no CCE Neuro/Psych:  Unresponsive to exam.  Did not make vigourous attempt to wake him up.  Hands tied with soft restraints  Intake/Output from previous day: 09/01 0701 - 09/02 0700 In: 1671.2 [I.V.:1271.2; IV Piggyback:400] Out: -   Intake/Output this shift: Total I/O In: 61 [I.V.:61] Out: -   Lab Results:  Recent Labs  04/02/15 0649 04/02/15 1015 04/03/15 0143  WBC 21.3* 18.6* 13.2*  HGB 12.6* 13.5 11.3*  HCT 42.2 45.4 37.1*  PLT 274 226 182   BMET  Recent Labs  04/02/15 0649 04/02/15 1015 04/03/15 0130  NA 139 140 140  K 4.3 5.9* 3.6  CL 106 109 108  CO2 27 23 22   GLUCOSE 181* 206* 203*  BUN 20 23* 23*  CREATININE 1.16 1.45* 1.43*  CALCIUM 8.5* 8.2* 8.2*   LFT  Recent Labs  03/31/15 1100 03/31/15 1937 04/02/15 1015 04/03/15 0130  PROT 7.3 6.1* 6.2*  --   ALBUMIN 3.3* 2.8* 2.8* 2.3*  AST 33 27 36  --   ALT 26 20 24   --   ALKPHOS 45 39 57  --   BILITOT 0.6 0.7 0.7  --    PT/INR  Recent Labs  03/31/15 1937  LABPROT 16.0*  INR 1.27   Hepatitis Panel No results for input(s): HEPBSAG, HCVAB, HEPAIGM, HEPBIGM in the last 72 hours.  Studies/Results: Dg Chest Port 1 View  04/02/2015    CLINICAL DATA:  Status post central line placement.  EXAM: PORTABLE CHEST - 1 VIEW  COMPARISON:  Single view of the chest earlier today.  FINDINGS: A new left IJ catheter is in place with the tip projecting over the mid to lower superior vena cava. Support apparatus is otherwise unchanged. There is no pneumothorax. The lungs are clear. Heart size is normal.  IMPRESSION: Left IJ catheter tip projects over the mid to lower superior vena cava. Negative for pneumothorax. No other change.   Electronically Signed   By: Inge Rise M.D.   On: 04/02/2015 19:13   Dg Chest Port 1 View  04/02/2015   CLINICAL DATA:  Hypoxia  EXAM: PORTABLE CHEST - 1 VIEW  COMPARISON:  Study obtained earlier in the day  FINDINGS: Endotracheal tube tip is 2.8 cm above the carina. Nasogastric tube tip and side port are in the stomach. Pacemaker leads are attached to the right atrium and right ventricle. No pneumothorax. There is no edema or consolidation. The heart is upper normal in size with pulmonary vascularity within normal limits. No adenopathy. There is atherosclerotic calcification in the aortic arch  region.  IMPRESSION: Tube positions as described without pneumothorax. No edema or consolidation. The ill-defined opacity in the right base seen earlier in the day is not appreciable on this examination. Cardiac silhouette within normal limits.   Electronically Signed   By: Lowella Grip III M.D.   On: 04/02/2015 11:05   Dg Chest Port 1 View  04/02/2015   CLINICAL DATA:  Dyspnea, onset tonight.  EXAM: PORTABLE CHEST - 1 VIEW  COMPARISON:  04/01/2015  FINDINGS: There is developing basilar opacity on the right which could represent infectious infiltrate. No large effusion is evident. Mild interstitial and vascular prominence is present.  IMPRESSION: Developing patchy right base opacity which could represent pneumonia. Noninfectious atelectasis is also possibility.   Electronically Signed   By: Andreas Newport M.D.   On: 04/02/2015  05:23   Scheduled Meds: . antiseptic oral rinse  7 mL Mouth Rinse QID  . aspirin  81 mg Per Tube Daily  . chlorhexidine gluconate  15 mL Mouth Rinse BID  . heparin  5,000 Units Subcutaneous 3 times per day  . insulin aspart  0-9 Units Subcutaneous 6 times per day  . insulin glargine  8 Units Subcutaneous q morning - 10a  . ipratropium-albuterol  3 mL Nebulization Q4H  . methylPREDNISolone (SOLU-MEDROL) injection  40 mg Intravenous Q6H  . pantoprazole sodium  40 mg Per Tube Daily  . piperacillin-tazobactam (ZOSYN)  IV  3.375 g Intravenous Q8H  . pravastatin  40 mg Per Tube QPM  . vancomycin  1,250 mg Intravenous Q24H   Continuous Infusions: . sodium chloride 10 mL/hr at 04/03/15 0052  . sodium chloride 50 mL/hr at 04/03/15 0053  . fentaNYL infusion INTRAVENOUS 25 mcg/hr (04/03/15 0814)   PRN Meds:.acetaminophen (TYLENOL) oral liquid 160 mg/5 mL, fentaNYL, fentaNYL (SUBLIMAZE) injection, LORazepam   ASSESMENT:   * Achalasia, dysphagia  * Aspiration PNA, recurrent with sepsis picture. Day 4 abx Vanc/Zosyn. Max WBC to 25 K, 21 K today. Last fever to 101.2 at 1045 yesterday. Hypotension and tachycardia improved.  Apnea, resp failure requiring ETT/vent. ? If unobserved, spontaneous aspiration event preceded this?  *  Elevated Troponin.  LVEF 10 to 15% (35 to 40% 01/14/2015), diffuse hypokiniesis.  No acute ST changes.  Heparin drip initiated.   *  ARF  * Ischemic CM. S/p ICD/pacemaker  * IDDM.     PLAN   *  Given his multiple acute issues, will check back with pt next week.  Once stable will arrange EGD and botox injection.     Azucena Freed  04/03/2015, 8:50 AM Pager: [phone]     Attending physician's note   I have taken an interval history, reviewed the chart and examined the patient. I agree with the Advanced Practitioner's note, impression and recommendations. Would like to proceed with EGD/Botox as inpt when he has made a very good recovery. Unusual to  have so many serious aspiration events due to achalasia in a patient who is alert and very functional at baseline. Consider oropharyngeal dysphagia and other etiologies contributing to or causing PNA as well. He should have a speech pathology evaluation when he is able to cooperate. We will check back on his progress in a few days. Please call if GI assistance needed. Dr. Hilarie Fredrickson is covering this weekend.   Pricilla Riffle. Fuller Plan, MD [facility] [phone] pager Mon-Fri 8a-5p [phone] weekends, holidays and 5p-8a or per [facility]

## 2015-04-03 NOTE — Progress Notes (Signed)
Initial Nutrition Assessment  DOCUMENTATION CODES:   Not applicable  INTERVENTION:    Initiate TF via OGT with Vital AF 1.2 at 30 ml/h, increase by 20 ml every 4 hours to goal rate of 70 ml/h (1680 ml per day) to provide 2016 kcals, 126 gm protein, 1362 ml free water daily.  NUTRITION DIAGNOSIS:   Inadequate oral intake related to inability to eat as evidenced by NPO status.  GOAL:   Patient will meet greater than or equal to 90% of their needs  MONITOR:   TF tolerance, Vent status, Weight trends, Labs, I & O's  REASON FOR ASSESSMENT:   Consult Enteral/tube feeding initiation and management  ASSESSMENT:   81 yo male former smoker with multiple medical problems including CAD, chronic sCHF, COPD, CKD, DM. Recently admitted 8/20-8/23 with AECOPD and ?aspiration PNA. Returned 8/30 with increased cough and SOB. Admitted with HCAP vs aspiration PNA. Respiratory status declined and required intubation on 9/1.   Discussed patient in ICU rounds and with RN today. Received MD Consult for TF initiation and management. Labs reviewed, phosphorus is low.  Patient is currently intubated on ventilator support. MV: 14.6 L/min Temp (24hrs), Avg:98.6 F (37 C), Min:98 F (36.7 C), Max:99.4 F (37.4 C)   Diet Order:   NPO  Skin:  Reviewed, no issues  Last BM:  8/31  Height:   Ht Readings from Last 1 Encounters:  03/31/15 5\' 10"  (1.778 m)    Weight:   Wt Readings from Last 1 Encounters:  03/31/15 191 lb 2.2 oz (86.7 kg)    Ideal Body Weight:  75.5 kg  BMI:  Body mass index is 27.43 kg/(m^2).  Estimated Nutritional Needs:   Kcal:  2016  Protein:  110-130 gm  Fluid:  2 L  EDUCATION NEEDS:   No education needs identified at this time   Molli Barrows, [place], [place], [place] Pager [phone] After Hours Pager [phone]

## 2015-04-03 NOTE — Progress Notes (Signed)
eLink Physician-Brief Progress Note Patient Name: Antonio Ray DOB: [birth year] MRN: [id number]   Date of Service  04/03/2015  HPI/Events of Note  Self extubated, breathing comfortably Conversant, no distress  eICU Interventions  Monitor carefully Instructed nurse to let me know if respiratory or mental status changes     Intervention Category Intermediate Interventions: Respiratory distress - evaluation and management  MCQUAID, DOUGLAS 04/03/2015, 5:53 PM

## 2015-04-04 ENCOUNTER — Inpatient Hospital Stay (HOSPITAL_COMMUNITY): Payer: Medicare Other

## 2015-04-04 DIAGNOSIS — I2510 Atherosclerotic heart disease of native coronary artery without angina pectoris: Secondary | ICD-10-CM

## 2015-04-04 DIAGNOSIS — A419 Sepsis, unspecified organism: Principal | ICD-10-CM

## 2015-04-04 LAB — GLUCOSE, CAPILLARY
GLUCOSE-CAPILLARY: 167 mg/dL — AB (ref 65–99)
GLUCOSE-CAPILLARY: 146 mg/dL — AB (ref 65–99)
GLUCOSE-CAPILLARY: 301 mg/dL — AB (ref 65–99)
Glucose-Capillary: 152 mg/dL — ABNORMAL HIGH (ref 65–99)
Glucose-Capillary: 135 mg/dL — ABNORMAL HIGH (ref 65–99)

## 2015-04-04 LAB — BASIC METABOLIC PANEL
Anion gap: 5 (ref 5–15)
BUN: 23 mg/dL — AB (ref 6–20)
CALCIUM: 7.8 mg/dL — AB (ref 8.9–10.3)
CHLORIDE: 107 mmol/L (ref 101–111)
CO2: 28 mmol/L (ref 22–32)
CREATININE: 1.27 mg/dL — AB (ref 0.61–1.24)
GFR calc non Af Amer: 52 mL/min — ABNORMAL LOW (ref >60)
Glucose, Bld: 151 mg/dL — ABNORMAL HIGH (ref 65–99)
Potassium: 4 mmol/L (ref 3.5–5.1)
SODIUM: 140 mmol/L (ref 135–145)

## 2015-04-04 LAB — CBC
HCT: 37.4 % — ABNORMAL LOW (ref 39.0–52.0)
HEMOGLOBIN: 11.5 g/dL — AB (ref 13.0–17.0)
MCH: 26.9 pg (ref 26.0–34.0)
MCHC: 30.7 g/dL (ref 30.0–36.0)
MCV: 87.6 fL (ref 78.0–100.0)
Platelets: 173 10*3/uL (ref 150–400)
RBC: 4.27 MIL/uL (ref 4.22–5.81)
RDW: 14.8 % (ref 11.5–15.5)
WBC: 17.8 10*3/uL — ABNORMAL HIGH (ref 4.0–10.5)

## 2015-04-04 LAB — MAGNESIUM: MAGNESIUM: 2.2 mg/dL (ref 1.7–2.4)

## 2015-04-04 LAB — BLOOD GAS, ARTERIAL
Acid-Base Excess: 0 mmol/L (ref 0.0–2.0)
Bicarbonate: 24.7 mEq/L — ABNORMAL HIGH (ref 20.0–24.0)
DRAWN BY: 345601
O2 Content: 4 L/min
O2 SAT: 98.6 %
PH ART: 7.369 (ref 7.350–7.450)
PO2 ART: 147 mmHg — AB (ref 80.0–100.0)
Patient temperature: 98.6
TCO2: 26 mmol/L (ref 0–100)
pCO2 arterial: 43.9 mmHg (ref 35.0–45.0)

## 2015-04-04 LAB — VANCOMYCIN, TROUGH: Vancomycin Tr: 15 ug/mL (ref 10.0–20.0)

## 2015-04-04 LAB — PHOSPHORUS: PHOSPHORUS: 3 mg/dL (ref 2.5–4.6)

## 2015-04-04 LAB — TROPONIN I
TROPONIN I: 0.49 ng/mL — AB (ref <0.031)
TROPONIN I: 0.39 ng/mL — AB (ref <0.031)

## 2015-04-04 MED ORDER — IPRATROPIUM-ALBUTEROL 0.5-2.5 (3) MG/3ML IN SOLN
3.0000 mL | Freq: Four times a day (QID) | RESPIRATORY_TRACT | Status: DC
  Administered 2015-04-05 (×2): 3 mL via RESPIRATORY_TRACT
  Filled 2015-04-04 (×2): qty 3

## 2015-04-04 MED ORDER — FUROSEMIDE 10 MG/ML IJ SOLN
40.0000 mg | Freq: Three times a day (TID) | INTRAMUSCULAR | Status: AC
Start: 2015-04-04 — End: 2015-04-04
  Administered 2015-04-04 (×2): 40 mg via INTRAVENOUS
  Filled 2015-04-04 (×2): qty 4

## 2015-04-04 NOTE — Progress Notes (Signed)
Attempted to call report to [phone]

## 2015-04-04 NOTE — Progress Notes (Signed)
ANTIBIOTIC CONSULT NOTE - FOLLOW UP  Pharmacy Consult for Vancomycin and Zosyn Indication: rule out sepsis and recurrent pneumonia/HCAP  Allergies  Allergen Reactions  . Codeine Other (See Comments)    Gets very angry, disoriented  . Dilaudid [Hydromorphone Hcl] Other (See Comments)    VERY AGITATED, HOSTILE  . Flomax [Tamsulosin Hcl] Shortness Of Breath  . Morphine And Related Other (See Comments)    VERY AGITATED, HOSTILE  . Sulfa Antibiotics Shortness Of Breath  . Beta Adrenergic Blockers     ? disorientation  . Carvedilol Other (See Comments)    DISORIENTATION  . Levofloxacin Other (See Comments)    unknown    Patient Measurements: Height: 5\' 10"  (177.8 cm) Weight: 201 lb 1 oz (91.2 kg) IBW/kg (Calculated) : 73  Vital Signs: Temp: 97.5 F (36.4 C) (09/03 0757) Temp Source: Oral (09/03 0757) BP: 137/60 mmHg (09/03 1200) Pulse Rate: 94 (09/03 1200)  Labs:  Recent Labs  04/02/15 1015 04/03/15 0130 04/03/15 0143 04/04/15 0504  WBC 18.6*  --  13.2* 17.8*  HGB 13.5  --  11.3* 11.5*  PLT 226  --  182 173  CREATININE 1.45* 1.43*  --  1.27*   Estimated Creatinine Clearance: 53.6 mL/min (by C-G formula based on Cr of 1.27).  Microbiology: Recent Results (from the past 720 hour(s))  Blood culture (routine x 2)     Status: None   Collection Time: 03/21/15 10:45 AM  Result Value Ref Range Status   Specimen Description BLOOD RIGHT ANTECUBITAL  Final   Special Requests BOTTLES DRAWN AEROBIC AND ANAEROBIC 10ML  Final   Culture NO GROWTH 5 DAYS  Final   Report Status 03/26/2015 FINAL  Final  Blood culture (routine x 2)     Status: None   Collection Time: 03/21/15 11:00 AM  Result Value Ref Range Status   Specimen Description BLOOD LEFT ANTECUBITAL  Final   Special Requests BOTTLES DRAWN AEROBIC AND ANAEROBIC 10ML  Final   Culture NO GROWTH 5 DAYS  Final   Report Status 03/26/2015 FINAL  Final  Blood Culture (routine x 2)     Status: None (Preliminary result)   Collection Time: 03/31/15 11:00 AM  Result Value Ref Range Status   Specimen Description BLOOD LEFT ANTECUBITAL  Final   Special Requests BOTTLES DRAWN AEROBIC AND ANAEROBIC 5ML  Final   Culture NO GROWTH 4 DAYS  Final   Report Status PENDING  Incomplete  Blood Culture (routine x 2)     Status: None (Preliminary result)   Collection Time: 03/31/15 11:17 AM  Result Value Ref Range Status   Specimen Description BLOOD LEFT ARM  Final   Special Requests BOTTLES DRAWN AEROBIC AND ANAEROBIC 5ML  Final   Culture NO GROWTH 4 DAYS  Final   Report Status PENDING  Incomplete  Urine culture     Status: None   Collection Time: 03/31/15 11:40 AM  Result Value Ref Range Status   Specimen Description URINE, CLEAN CATCH  Final   Special Requests NONE  Final   Culture NO GROWTH 1 DAY  Final   Report Status 04/01/2015 FINAL  Final  MRSA PCR Screening     Status: None   Collection Time: 03/31/15  5:09 PM  Result Value Ref Range Status   MRSA by PCR NEGATIVE NEGATIVE Final    Comment:        The GeneXpert MRSA Assay (FDA approved for NASAL specimens only), is one component of a comprehensive MRSA colonization surveillance program.  It is not intended to diagnose MRSA infection nor to guide or monitor treatment for MRSA infections.   Culture, sputum-assessment     Status: None   Collection Time: 04/01/15  6:15 AM  Result Value Ref Range Status   Specimen Description SPUTUM  Final   Special Requests NONE  Final   Sputum evaluation   Final    THIS SPECIMEN IS ACCEPTABLE. RESPIRATORY CULTURE REPORT TO FOLLOW.   Report Status 04/01/2015 FINAL  Final  Culture, respiratory (NON-Expectorated)     Status: None   Collection Time: 04/01/15  6:15 AM  Result Value Ref Range Status   Specimen Description EXPECTORATED SPUTUM  Final   Special Requests NONE  Final   Gram Stain   Final    FEW WBC PRESENT, PREDOMINANTLY PMN RARE SQUAMOUS EPITHELIAL CELLS PRESENT RARE GRAM POSITIVE COCCI IN PAIRS RARE GRAM  NEGATIVE COCCI RARE GRAM NEGATIVE RODS    Culture   Final    NORMAL OROPHARYNGEAL FLORA Performed at [facility]    Report Status 04/03/2015 FINAL  Final  MRSA PCR Screening     Status: None   Collection Time: 04/02/15 10:12 AM  Result Value Ref Range Status   MRSA by PCR NEGATIVE NEGATIVE Final    Comment:        The GeneXpert MRSA Assay (FDA approved for NASAL specimens only), is one component of a comprehensive MRSA colonization surveillance program. It is not intended to diagnose MRSA infection nor to guide or monitor treatment for MRSA infections.     Medical History: Past Medical History  Diagnosis Date  . GERD (gastroesophageal reflux disease)   . Coronary artery disease     History of remote anterior MI with PCI to LAD in 2006; most recent cath 2007, no intervention required  . Chronic systolic dysfunction of left ventricle     EF 30-35%, CLASS II - III SYMPTOMS; intolerant to Coreg  . Hyperlipidemia   . PVC's (premature ventricular contractions)   . BPH (benign prostatic hyperplasia)   . CHF (congestive heart failure)   . COPD (chronic obstructive pulmonary disease)   . Heart murmur     hx  . Pneumonia "several times"    aspiration pna with at least 3 admits for this 2016.   . Type II diabetes mellitus   . CKD (chronic kidney disease) stage 3, GFR 30-59 ml/min 06/08/2012  . Skin cancer "several"    "forearms; head"  . Memory loss   . Anginal pain   . Dysrhythmia   . Myocardial infarction 1994    ANTERIOR  . AICD (automatic cardioverter/defibrillator) present 03/30/2011    Analyze ST study patient  . DOE (dyspnea on exertion)     with heavy exertion   Assessment: 81 yr old male to be admitted with recurrent aspiration pneumonia.  Was just in the hospital 8/20-23/2016. Received Zosyn 8/20>>8/23, transitioned to Augmentin on 8/23 and transferred to floor. Now back in ICU with worsening respiratory status and concern for repeat aspiration.     Trough came back therapeutic today.   Vanc 8/30>>8/31;9/1>>  Zosyn 8/30>>8/31;9/1>>   8/31 Sputum>>Normal flora   8/30 blood x 2 -NGTD   8/30 urine - NEGF  8/20 (last admit) blood x 2 - neg   Goal of Therapy:  Vancomycin trough level 15-20 mcg/ml appropriate Zosyn dose for renal function and infection  Plan:   Cont Vancomycin to 750 mg IV Q 12 hours  Zosyn 3.375 gm IV q8hrs (each over 4  hours).  Onnie Boer, PharmD Pager: [phone] 04/04/2015 12:27 PM

## 2015-04-04 NOTE — Progress Notes (Signed)
Pt arrived from 2 MW transferred to new bed. Placed on monitor, central line & foley cath intact. Bed alarm on. Given yankeur suction. Oriented to room & call system.

## 2015-04-04 NOTE — Evaluation (Addendum)
Clinical/Bedside Swallow Evaluation Patient Details  Name: Antonio Ray MRN: [id number] Date of Birth: [birth year]  Today's Date: 04/04/2015 Time: SLP Start Time (ACUTE ONLY): 1340 SLP Stop Time (ACUTE ONLY): 1420 SLP Time Calculation (min) (ACUTE ONLY): 40 min  Past Medical History:  Past Medical History  Diagnosis Date  . GERD (gastroesophageal reflux disease)   . Coronary artery disease     History of remote anterior MI with PCI to LAD in 2006; most recent cath 2007, no intervention required  . Chronic systolic dysfunction of left ventricle     EF 30-35%, CLASS II - III SYMPTOMS; intolerant to Coreg  . Hyperlipidemia   . PVC's (premature ventricular contractions)   . BPH (benign prostatic hyperplasia)   . CHF (congestive heart failure)   . COPD (chronic obstructive pulmonary disease)   . Heart murmur     hx  . Pneumonia "several times"    aspiration pna with at least 3 admits for this 2016.   . Type II diabetes mellitus   . CKD (chronic kidney disease) stage 3, GFR 30-59 ml/min 06/08/2012  . Skin cancer "several"    "forearms; head"  . Memory loss   . Anginal pain   . Dysrhythmia   . Myocardial infarction 1994    ANTERIOR  . AICD (automatic cardioverter/defibrillator) present 03/30/2011    Analyze ST study patient  . DOE (dyspnea on exertion)     with heavy exertion   Past Surgical History:  Past Surgical History  Procedure Laterality Date  . Foot fracture surgery Right 1980's  . Cholecystectomy  2/11  . Cataract extraction w/ intraocular lens  implant, bilateral  08/2014-09/2014  . Cardiac catheterization  01/11/2006    DEMONSTRATES AKINESIA OF THE DISTAL ANTERIOR WALL, DISTAL INFERIOR WALL AND AKINESIA OF THE APEX. THE BASAL SEGMENTS CONTRACT WELL AND OVERALL EF 35%  . Coronary angioplasty  1994    TO THE LAD  . Skin cancer excision  "several"    "forearms, head"  . Esophageal manometry N/A 03/16/2015    Procedure: ESOPHAGEAL MANOMETRY (EM);  Surgeon: Jerene Bears,  MD;  Location: [facility];  Service: Gastroenterology;  Laterality: N/A;  . Insert / replace / remove pacemaker     HPI:  Antonio Ray is a 81 y.o. male with a past medical history of chronic systolic congestive heart failure having ejection fraction 35-40%, chronic kidney disease, established history of coronary artery disease; who was recently admitted and discharge on 8/23 due to aspiration PNA and achalasia; who returned today to ED secondary to increasing cough and shortness of breath. Patient has RLL PNA on CXR and also WBc's 18.1, temp 101.2, HR 124, RR 39; lactic acid 2.4. Which appears to be secondary to aspiration again. Patient would be admitted due to sepsis 2/2 PNA (HCAP vs aspiration). Pt with code blue for respiratory distress this admission and intubated on 04-02-15 for one day. Extubated 04-03-15. Bedside swallow eval ordered post-self extubation and with recurring aspiration PNA to determine safest diet.   Assessment / Plan / Recommendation Clinical Impression  Pt currently with severe achalasia with resultant recurring aspiration PNA. Son reported pt as not yet started botox. No immediate s/s of aspiration at bedside and no signs of oropharyngeal dysfunction. Pt had prolonged cough prior to any PO intake and had a delayed cough following 4 sips of thin liquid. Son at bedside reported that most recent respiratory episode happened after diet advancement was made from full liquid to puree. Risk of  aspiration is high with any solid consistency. Recommend full liquid diet (NO purees), provide small meds whole with liquid; if large, provide via alternative means. Reviewed compensatory strategies- small sips at a time, sit fully upright at least 1 hour after liquid intake, small, frequent meals, no POs for 2 hours before going to bed at night. Pt reported difficulties with cold liquids- provide room temperature, no ice. Full supervision for any PO intake. Provided education of results/  recommendations to son and pt who are in agreement. Will continue to follow to review compensatory strategy review.    Aspiration Risk  Severe    Diet Recommendation Thin   Medication Administration: Whole meds with liquid (crushed if large) Compensations: Small sips/bites;Slow rate    Other  Recommendations Recommended Consults: Consider esophageal assessment Oral Care Recommendations: Oral care BID Other Recommendations: Have oral suction available;Clarify dietary restrictions   Follow Up Recommendations       Frequency and Duration min 1 x/week  1 week   Pertinent Vitals/Pain n/a    SLP Swallow Goals     Swallow Study Prior Functional Status       General Other Pertinent Information: Antonio Ray is a 81 y.o. male with a past medical history of chronic systolic congestive heart failure having ejection fraction 35-40%, chronic kidney disease, established history of coronary artery disease; who was recently admitted and discharge on 8/23 due to aspiration PNA and achalasia; who returned today to ED secondary to increasing cough and shortness of breath. Patient has RLL PNA on CXR and also WBc's 18.1, temp 101.2, HR 124, RR 39; lactic acid 2.4. Which appears to be secondary to aspiration again. Patient would be admitted due to sepsis 2/2 PNA (HCAP vs aspiration). Pt with code blue for respiratory distress this admission and intubated on 04-02-15 for one day. Extubated 04-03-15. Bedside swallow eval ordered post-self extubation and with recurring aspiration PNA to determine safest diet. Type of Study: Bedside swallow evaluation Previous Swallow Assessment: Multiple BSEs with most recent recommendation for soft diet/ thin liquids Diet Prior to this Study: NPO Temperature Spikes Noted: No Respiratory Status: Supplemental O2 delivered via (comment) History of Recent Intubation: Yes Length of Intubations (days): 1 days Date extubated: 04/03/15 Behavior/Cognition:  Alert;Cooperative;Pleasant mood Oral Cavity - Dentition: Other (Comment) (natural lower, upper partial) Self-Feeding Abilities: Able to feed self Patient Positioning: Upright in bed Baseline Vocal Quality: Hoarse Volitional Cough: Strong Volitional Swallow: Able to elicit    Oral/Motor/Sensory Function Overall Oral Motor/Sensory Function: Appears within functional limits for tasks assessed Labial ROM: Within Functional Limits Labial Symmetry: Within Functional Limits Labial Strength: Within Functional Limits Labial Sensation: Within Functional Limits Lingual ROM: Within Functional Limits Lingual Symmetry: Within Functional Limits Lingual Strength: Within Functional Limits Lingual Sensation: Within Functional Limits   Ice Chips Ice chips: Not tested   Thin Liquid Thin Liquid: Impaired Presentation: Cup;Straw Pharyngeal  Phase Impairments: Cough - Delayed    Nectar Thick Nectar Thick Liquid: Not tested   Honey Thick Honey Thick Liquid: Not tested   Puree Puree: Not tested   Solid   GO    Solid: Not tested       Kern Reap, MA, CCC-SLP 04/04/2015,2:32 PM [phone]

## 2015-04-04 NOTE — Progress Notes (Signed)
Subjective: No CP  No SOB   Objective: Filed Vitals:   04/04/15 1038 04/04/15 1100 04/04/15 1136 04/04/15 1150  BP: 132/66 138/91  125/71  Pulse: 92 91 94 90  Temp:      TempSrc:      Resp: 24 24 29 29   Height:      Weight:      SpO2: 98% 94% 93% 97%   Weight change:   Intake/Output Summary (Last 24 hours) at 04/04/15 1203 Last data filed at 04/04/15 1000  Gross per 24 hour  Intake    888 ml  Output   1700 ml  Net   -812 ml    General: Alert, awake, oriented x3, in no acute distress Neck:  JVP is normal Heart: Regular rate and rhythm, without murmurs, rubs, gallops.  Lungs: Clear to auscultation.  No rales or wheezes. Exemities:  No edema.   Neuro: Grossly intact, nonfocal.  Tele:  SR   Lab Results: Results for orders placed or performed during the hospital encounter of 03/31/15 (from the past 24 hour(s))  Troponin I (q 6hr x 3)     Status: Abnormal   Collection Time: 04/03/15 12:45 PM  Result Value Ref Range   Troponin I 0.70 (HH) <0.031 ng/mL  Glucose, capillary     Status: Abnormal   Collection Time: 04/03/15  3:49 PM  Result Value Ref Range   Glucose-Capillary 219 (H) 65 - 99 mg/dL   Comment 1 Notify RN    Comment 2 Document in Chart   Troponin I (q 6hr x 3)     Status: Abnormal   Collection Time: 04/03/15  7:16 PM  Result Value Ref Range   Troponin I 0.54 (HH) <0.031 ng/mL  Glucose, capillary     Status: Abnormal   Collection Time: 04/03/15  7:26 PM  Result Value Ref Range   Glucose-Capillary 188 (H) 65 - 99 mg/dL   Comment 1 Notify RN    Comment 2 Document in Chart   Troponin I     Status: Abnormal   Collection Time: 04/04/15  1:00 AM  Result Value Ref Range   Troponin I 0.49 (H) <0.031 ng/mL  Blood gas, arterial     Status: Abnormal   Collection Time: 04/04/15  3:50 AM  Result Value Ref Range   O2 Content 4.0 L/min   Delivery systems NASAL CANNULA    pH, Arterial 7.369 7.350 - 7.450   pCO2 arterial 43.9 35.0 - 45.0 mmHg   pO2, Arterial 147  (H) 80.0 - 100.0 mmHg   Bicarbonate 24.7 (H) 20.0 - 24.0 mEq/L   TCO2 26.0 0 - 100 mmol/L   Acid-Base Excess 0.0 0.0 - 2.0 mmol/L   O2 Saturation 98.6 %   Patient temperature 98.6    Collection site RIGHT RADIAL    Drawn by [phone]    Sample type ARTERIAL DRAW    Allens test (pass/fail) PASS PASS  Glucose, capillary     Status: Abnormal   Collection Time: 04/04/15  3:55 AM  Result Value Ref Range   Glucose-Capillary 152 (H) 65 - 99 mg/dL   Comment 1 Notify RN    Comment 2 Document in Chart   CBC     Status: Abnormal   Collection Time: 04/04/15  5:04 AM  Result Value Ref Range   WBC 17.8 (H) 4.0 - 10.5 K/uL   RBC 4.27 4.22 - 5.81 MIL/uL   Hemoglobin 11.5 (L) 13.0 - 17.0 g/dL   HCT 37.4 (  L) 39.0 - 52.0 %   MCV 87.6 78.0 - 100.0 fL   MCH 26.9 26.0 - 34.0 pg   MCHC 30.7 30.0 - 36.0 g/dL   RDW 14.8 11.5 - 15.5 %   Platelets 173 150 - 400 K/uL  Basic metabolic panel     Status: Abnormal   Collection Time: 04/04/15  5:04 AM  Result Value Ref Range   Sodium 140 135 - 145 mmol/L   Potassium 4.0 3.5 - 5.1 mmol/L   Chloride 107 101 - 111 mmol/L   CO2 28 22 - 32 mmol/L   Glucose, Bld 151 (H) 65 - 99 mg/dL   BUN 23 (H) 6 - 20 mg/dL   Creatinine, Ser 1.27 (H) 0.61 - 1.24 mg/dL   Calcium 7.8 (L) 8.9 - 10.3 mg/dL   GFR calc non Af Amer 52 (L) >60 mL/min   GFR calc Af Amer >60 >60 mL/min   Anion gap 5 5 - 15  Magnesium     Status: None   Collection Time: 04/04/15  5:04 AM  Result Value Ref Range   Magnesium 2.2 1.7 - 2.4 mg/dL  Phosphorus     Status: None   Collection Time: 04/04/15  5:04 AM  Result Value Ref Range   Phosphorus 3.0 2.5 - 4.6 mg/dL  Troponin I     Status: Abnormal   Collection Time: 04/04/15  7:54 AM  Result Value Ref Range   Troponin I 0.39 (H) <0.031 ng/mL  Glucose, capillary     Status: Abnormal   Collection Time: 04/04/15  7:57 AM  Result Value Ref Range   Glucose-Capillary 135 (H) 65 - 99 mg/dL  Vancomycin, trough     Status: None   Collection Time:  04/04/15  9:30 AM  Result Value Ref Range   Vancomycin Tr 15 10.0 - 20.0 ug/mL    Studies/Results: Dg Chest Port 1 View  04/04/2015   CLINICAL DATA:  81 year old male recently extubated.  EXAM: PORTABLE CHEST - 1 VIEW  COMPARISON:  Chest x-ray 04/02/2015.  FINDINGS: Previously noted endotracheal tube has been removed. Left IJ central venous catheter with tip terminating in the mid superior vena cava. Left-sided pacemaker/AICD with leads projecting over the expected location of the right atrium and right ventricular apex. Previously noted transcutaneous defibrillator pads have been removed. Lung volumes are low. Bibasilar opacities favored to reflect predominantly subsegmental atelectasis, although underlying airspace consolidation is not excluded (particularly in the left lower lobe). No definite consolidative airspace disease. No definite pleural effusions. There is cephalization of the pulmonary vasculature and slight indistinctness of the interstitial markings suggestive of mild pulmonary edema. Mild cardiomegaly. Atherosclerosis in the thoracic aorta.  IMPRESSION: 1. Support apparatus, as above. 2. Mild cardiomegaly with mild interstitial pulmonary edema; imaging findings suggestive of mild congestive heart failure. 3. Low lung volumes with probable bibasilar subsegmental atelectasis. 4. Atherosclerosis.   Electronically Signed   By: Vinnie Langton M.D.   On: 04/04/2015 08:46    Medications: REviewed    @PROBHOSP @  1 Acute on chronic systoic CHF Decline in LVEF from echo in June  May be related to current illness (sepsis)  Volume status looks pretty good CCM is managing fluids    Elevated tropinin  Relatively flat  Prob due to demand.    3.   CAD  I do not think there is any acitve ischemia       LOS: 4 days   Dorris Carnes 04/04/2015, 12:03 PM

## 2015-04-04 NOTE — Progress Notes (Signed)
PULMONARY / CRITICAL CARE MEDICINE   Name: Antonio Ray MRN: [id number] DOB: 06-Oct-1933    ADMISSION DATE:  03/31/2015 CONSULTATION DATE:  9/1  REFERRING MD :  Earlie Counts (triad)   CHIEF COMPLAINT:  Respiratory arrest   INITIAL PRESENTATION:  81 yo male former smoker with multiple medical problems including CAD, chronic sCHF, COPD, CKD, DM. Recently admitted 8/20-8/23 with AECOPD and ?aspiration PNA.  Returned 8/30 with increased cough and SOB.  Admitted with HCAP v aspiration PNA.  Was progressing, tx from SDU to floor. On 9/1 pts son found him lethargic, agonal respirations.  He bagged him, code blue was called and pt intubated by anesthesia, tx to ICU.   STUDIES:  2D echo 9/1>>>  SIGNIFICANT EVENTS: 8/20-8/23 admit for AECOPD, aspiration PNA  8/30 readmitted    HISTORY OF PRESENT ILLNESS:  81 yo male former smoker with multiple medical problems including CAD, chronic sCHF, COPD, CKD, DM. Recently admitted 8/20-8/23 with AECOPD and ?aspiration PNA.  Returned 8/30 with increased cough and SOB.  Admitted with HCAP v aspiration PNA.  Was progressing, tx from SDU to floor. On 9/1 pts son found him lethargic, agonal respirations.  He bagged him, code blue was called and pt intubated by anesthesia, tx to ICU.   SUBJECTIVE: Self extubated overnight, doing well.  VITAL SIGNS: Temp:  [97.5 F (36.4 C)-98.6 F (37 C)] 97.5 F (36.4 C) (09/03 0300) Pulse Rate:  [71-96] 93 (09/03 0800) Resp:  [15-28] 27 (09/03 0800) BP: (112-142)/(43-87) 140/66 mmHg (09/03 0800) SpO2:  [95 %-100 %] 100 % (09/03 0800) FiO2 (%):  [40 %] 40 % (09/02 1618) Weight:  [91.2 kg (201 lb 1 oz)] 91.2 kg (201 lb 1 oz) (09/03 0453)   HEMODYNAMICS: CVP:  [3 mmHg-12 mmHg] 10 mmHg   VENTILATOR SETTINGS: Vent Mode:  [-] PRVC FiO2 (%):  [40 %] 40 % Set Rate:  [25 bmp] 25 bmp Vt Set:  [580 mL] 580 mL PEEP:  [5 cmH20] 5 cmH20 Plateau Pressure:  [18 cmH20-19 cmH20] 19 cmH20   INTAKE / OUTPUT:  Intake/Output Summary  (Last 24 hours) at 04/04/15 0908 Last data filed at 04/04/15 0800  Gross per 24 hour  Intake  955.5 ml  Output   1450 ml  Net -494.5 ml   PHYSICAL EXAMINATION: General:  Chronically ill appearing male, NAD, off vent. Neuro:  Opens eyes spontaneously, tracks, trying to pull at tube, MAE, gen weakness, does not follow commands  HEENT:  Mm moist, no sig JVD, ETT Cardiovascular:  s1s2 rrr, mild tachy Lungs:  resps even, non labored on vent, diffuse exp wheeze, diminished R  Abdomen:  Round, mildly distended, +bs  Musculoskeletal:  Warm and dry, scant BLE edema   LABS:  CBC  Recent Labs Lab 04/02/15 1015 04/03/15 0143 04/04/15 0504  WBC 18.6* 13.2* 17.8*  HGB 13.5 11.3* 11.5*  HCT 45.4 37.1* 37.4*  PLT 226 182 173   Coag's  Recent Labs Lab 03/31/15 1937  APTT 30  INR 1.27   BMET  Recent Labs Lab 04/02/15 1015 04/03/15 0130 04/04/15 0504  NA 140 140 140  K 5.9* 3.6 4.0  CL 109 108 107  CO2 23 22 28   BUN 23* 23* 23*  CREATININE 1.45* 1.43* 1.27*  GLUCOSE 206* 203* 151*   Electrolytes  Recent Labs Lab 04/02/15 1015 04/03/15 0130 04/03/15 0143 04/04/15 0504  CALCIUM 8.2* 8.2*  --  7.8*  MG  --   --  2.0 2.2  PHOS  --  1.4*  --  3.0   Sepsis Markers  Recent Labs Lab 03/31/15 1937 03/31/15 2213 04/02/15 1015  LATICACIDVEN 1.4 1.6 1.6  PROCALCITON 2.83  --   --    ABG  Recent Labs Lab 04/02/15 1034 04/02/15 1348 04/04/15 0350  PHART 7.141* 7.368 7.369  PCO2ART 78.6* 37.7 43.9  PO2ART 419.0* 157.0* 147*   Liver Enzymes  Recent Labs Lab 03/31/15 1100 03/31/15 1937 04/02/15 1015 04/03/15 0130  AST 33 27 36  --   ALT 26 20 24   --   ALKPHOS 45 39 57  --   BILITOT 0.6 0.7 0.7  --   ALBUMIN 3.3* 2.8* 2.8* 2.3*   Cardiac Enzymes  Recent Labs Lab 04/03/15 1916 04/04/15 0100 04/04/15 0754  TROPONINI 0.54* 0.49* 0.39*   Glucose  Recent Labs Lab 04/03/15 0730 04/03/15 1139 04/03/15 1549 04/03/15 1926 04/04/15 0355  04/04/15 0757  GLUCAP 169* 225* 219* 188* 152* 135*   Imaging Dg Chest Port 1 View  04/04/2015   CLINICAL DATA:  81 year old male recently extubated.  EXAM: PORTABLE CHEST - 1 VIEW  COMPARISON:  Chest x-ray 04/02/2015.  FINDINGS: Previously noted endotracheal tube has been removed. Left IJ central venous catheter with tip terminating in the mid superior vena cava. Left-sided pacemaker/AICD with leads projecting over the expected location of the right atrium and right ventricular apex. Previously noted transcutaneous defibrillator pads have been removed. Lung volumes are low. Bibasilar opacities favored to reflect predominantly subsegmental atelectasis, although underlying airspace consolidation is not excluded (particularly in the left lower lobe). No definite consolidative airspace disease. No definite pleural effusions. There is cephalization of the pulmonary vasculature and slight indistinctness of the interstitial markings suggestive of mild pulmonary edema. Mild cardiomegaly. Atherosclerosis in the thoracic aorta.  IMPRESSION: 1. Support apparatus, as above. 2. Mild cardiomegaly with mild interstitial pulmonary edema; imaging findings suggestive of mild congestive heart failure. 3. Low lung volumes with probable bibasilar subsegmental atelectasis. 4. Atherosclerosis.   Electronically Signed   By: Vinnie Langton M.D.   On: 04/04/2015 08:46   I reviewed CXR myself with evidence of pulmonary edema.  ASSESSMENT / PLAN:  PULMONARY OETT 9/1>>> Acute respiratory failure  AECOPD Recurrent aspiration PNA/pneumonitis v HCAP P:   - Extubated and doing well. - Titrate O2 for sat of 88-92%. - Abx as below. - Systemic steroids. - BD's. - Diureses as below.  CARDIOVASCULAR Hx CAD  sCHF - EF 35%  Hypotension - mild.  Baseline SBP ~100 per son  P:  - Hold losartan for now  - Troponin positive and managed by cardiology. - EF down to 10-15%,cards to manage.  RENAL CKD III - stable  P:   F/u  chem. BMET in AM. Replace electrolytes as indicated. Lasix 40 mg IV q8 x2 doses.  GASTROINTESTINAL Dysphagia with recurrent aspiration PNA  Achalasia  P:   GI following - tentative plan was for EGD with botox but held for now. PPI. TF per nutrition. Swallow evaluation.  HEMATOLOGIC No active issue  P:  SQ heparin.  INFECTIOUS HCAP v recurrent aspiration PNA  P:   Vanc 9/1>>> Zosyn 9/1>>>  F/u on cultures.  ENDOCRINE DM  P:   SSI, lantus   NEUROLOGIC AMS - r/t hypercarbia.  P:   RASS goal: -1 PRN fentanyl, ativan while on vent  Daily WUA   FAMILY  - Updates:  Family updated bedside, transfer to SDU and to [facility] service with PCCM off 9/4.  Discussed with TRH-MD.  - Inter-disciplinary  family meet or Palliative Care meeting due by:  9/8  Rush Farmer, M.D. [facility] Pulmonary/Critical Care Medicine. Pager: [phone]. After hours pager: [phone].

## 2015-04-04 NOTE — Progress Notes (Signed)
RT increased O2 to 2 Lpm due to pat desat at 88%.

## 2015-04-05 LAB — BASIC METABOLIC PANEL
ANION GAP: 6 (ref 5–15)
BUN: 29 mg/dL — ABNORMAL HIGH (ref 6–20)
CO2: 31 mmol/L (ref 22–32)
Calcium: 8 mg/dL — ABNORMAL LOW (ref 8.9–10.3)
Chloride: 106 mmol/L (ref 101–111)
Creatinine, Ser: 1.32 mg/dL — ABNORMAL HIGH (ref 0.61–1.24)
GFR, EST AFRICAN AMERICAN: 58 mL/min — AB (ref >60)
GFR, EST NON AFRICAN AMERICAN: 50 mL/min — AB (ref >60)
Glucose, Bld: 119 mg/dL — ABNORMAL HIGH (ref 65–99)
POTASSIUM: 3.6 mmol/L (ref 3.5–5.1)
SODIUM: 143 mmol/L (ref 135–145)

## 2015-04-05 LAB — CBC
HCT: 37 % — ABNORMAL LOW (ref 39.0–52.0)
Hemoglobin: 11.2 g/dL — ABNORMAL LOW (ref 13.0–17.0)
MCH: 26.4 pg (ref 26.0–34.0)
MCHC: 30.3 g/dL (ref 30.0–36.0)
MCV: 87.3 fL (ref 78.0–100.0)
PLATELETS: 193 10*3/uL (ref 150–400)
RBC: 4.24 MIL/uL (ref 4.22–5.81)
RDW: 14.7 % (ref 11.5–15.5)
WBC: 12.1 10*3/uL — AB (ref 4.0–10.5)

## 2015-04-05 LAB — GLUCOSE, CAPILLARY
GLUCOSE-CAPILLARY: 121 mg/dL — AB (ref 65–99)
GLUCOSE-CAPILLARY: 177 mg/dL — AB (ref 65–99)
GLUCOSE-CAPILLARY: 196 mg/dL — AB (ref 65–99)
Glucose-Capillary: 145 mg/dL — ABNORMAL HIGH (ref 65–99)
Glucose-Capillary: 181 mg/dL — ABNORMAL HIGH (ref 65–99)
Glucose-Capillary: 208 mg/dL — ABNORMAL HIGH (ref 65–99)
Glucose-Capillary: 217 mg/dL — ABNORMAL HIGH (ref 65–99)
Glucose-Capillary: 224 mg/dL — ABNORMAL HIGH (ref 65–99)

## 2015-04-05 LAB — PHOSPHORUS: PHOSPHORUS: 2.6 mg/dL (ref 2.5–4.6)

## 2015-04-05 LAB — CULTURE, BLOOD (ROUTINE X 2)
CULTURE: NO GROWTH
Culture: NO GROWTH

## 2015-04-05 LAB — MAGNESIUM: MAGNESIUM: 2.4 mg/dL (ref 1.7–2.4)

## 2015-04-05 MED ORDER — DONEPEZIL HCL 10 MG PO TABS
10.0000 mg | ORAL_TABLET | Freq: Every morning | ORAL | Status: DC
  Administered 2015-04-05 – 2015-04-11 (×7): 10 mg via ORAL
  Filled 2015-04-05 (×7): qty 1

## 2015-04-05 MED ORDER — PANTOPRAZOLE SODIUM 40 MG PO PACK
40.0000 mg | PACK | Freq: Every day | ORAL | Status: DC
  Filled 2015-04-05: qty 20

## 2015-04-05 MED ORDER — ASPIRIN 81 MG PO CHEW
81.0000 mg | CHEWABLE_TABLET | Freq: Every day | ORAL | Status: DC
  Administered 2015-04-06 – 2015-04-11 (×6): 81 mg via ORAL
  Filled 2015-04-05 (×6): qty 1

## 2015-04-05 MED ORDER — LOSARTAN POTASSIUM 50 MG PO TABS
25.0000 mg | ORAL_TABLET | Freq: Every day | ORAL | Status: DC
  Administered 2015-04-05 – 2015-04-09 (×5): 25 mg via ORAL
  Filled 2015-04-05 (×5): qty 1

## 2015-04-05 MED ORDER — INSULIN ASPART 100 UNIT/ML ~~LOC~~ SOLN
0.0000 [IU] | Freq: Three times a day (TID) | SUBCUTANEOUS | Status: DC
  Administered 2015-04-05: 2 [IU] via SUBCUTANEOUS
  Administered 2015-04-06: 5 [IU] via SUBCUTANEOUS
  Administered 2015-04-06: 2 [IU] via SUBCUTANEOUS
  Administered 2015-04-06: 3 [IU] via SUBCUTANEOUS
  Administered 2015-04-07: 2 [IU] via SUBCUTANEOUS

## 2015-04-05 MED ORDER — FUROSEMIDE 40 MG PO TABS
40.0000 mg | ORAL_TABLET | Freq: Every day | ORAL | Status: DC
  Administered 2015-04-05 – 2015-04-11 (×7): 40 mg via ORAL
  Filled 2015-04-05 (×7): qty 1

## 2015-04-05 MED ORDER — FENTANYL CITRATE (PF) 100 MCG/2ML IJ SOLN
12.5000 ug | INTRAMUSCULAR | Status: DC | PRN
  Administered 2015-04-08 (×2): 25 ug via INTRAVENOUS

## 2015-04-05 MED ORDER — IPRATROPIUM-ALBUTEROL 0.5-2.5 (3) MG/3ML IN SOLN
3.0000 mL | Freq: Four times a day (QID) | RESPIRATORY_TRACT | Status: DC
  Administered 2015-04-05 – 2015-04-11 (×20): 3 mL via RESPIRATORY_TRACT
  Filled 2015-04-05 (×21): qty 3

## 2015-04-05 MED ORDER — PRAVASTATIN SODIUM 40 MG PO TABS
40.0000 mg | ORAL_TABLET | Freq: Every evening | ORAL | Status: DC
  Administered 2015-04-05 – 2015-04-10 (×6): 40 mg via ORAL
  Filled 2015-04-05 (×5): qty 1

## 2015-04-05 NOTE — Progress Notes (Signed)
PULMONARY / CRITICAL CARE MEDICINE   Name: Antonio Ray MRN: [id number] DOB: 29-Jun-1934    ADMISSION DATE:  03/31/2015 CONSULTATION DATE:  9/1  REFERRING MD :  Earlie Counts (triad)   CHIEF COMPLAINT:  Respiratory arrest   INITIAL PRESENTATION:  81 yo male former smoker with multiple medical problems including CAD, chronic sCHF, COPD, CKD, DM. Recently admitted 8/20-8/23 with AECOPD and ?aspiration PNA.  Returned 8/30 with increased cough and SOB.  Admitted with HCAP v aspiration PNA.  Was progressing, tx from SDU to floor. On 9/1 pts son found him lethargic, agonal respirations.  He bagged him, code blue was called and pt intubated by anesthesia, tx to ICU.   STUDIES:  2D echo 9/1>>>  SIGNIFICANT EVENTS: 8/20-8/23 admit for AECOPD, aspiration PNA  8/30 readmitted   SUBJECTIVE: Daughter in room notes scant clear/ trace yellow phlegm with cough.  VITAL SIGNS: Temp:  [97.2 F (36.2 C)-98.1 F (36.7 C)] 97.5 F (36.4 C) (09/04 0800) Pulse Rate:  [60-94] 91 (09/04 0800) Resp:  [21-36] 27 (09/04 0800) BP: (109-138)/(49-91) 135/67 mmHg (09/04 0800) SpO2:  [93 %-100 %] 97 % (09/04 0900) Weight:  [92 kg (202 lb 13.2 oz)] 92 kg (202 lb 13.2 oz) (09/04 0500)   HEMODYNAMICS: CVP:  [9 mmHg-14 mmHg] 14 mmHg   VENTILATOR SETTINGS:     INTAKE / OUTPUT:  Intake/Output Summary (Last 24 hours) at 04/05/15 1020 Last data filed at 04/05/15 0600  Gross per 24 hour  Intake   1450 ml  Output   3550 ml  Net  -2100 ml   PHYSICAL EXAMINATION: General:  Chronically ill appearing male, NAD, off vent. Neuro:  Awake, sits with assistance, nonfocal, appropriate responses HEENT:  Mm moist, no sig JVD,  Cardiovascular:  s1s2 rrr, regular Lungs:  Active raspy cough, bilateral wheeze, sats good on [place] O2 Abdomen:  Round, mildly distended, +bs  Musculoskeletal:  SCDs  LABS:  CBC  Recent Labs Lab 04/03/15 0143 04/04/15 0504 04/05/15 0458  WBC 13.2* 17.8* 12.1*  HGB 11.3* 11.5* 11.2*  HCT  37.1* 37.4* 37.0*  PLT 182 173 193   Coag's  Recent Labs Lab 03/31/15 1937  APTT 30  INR 1.27   BMET  Recent Labs Lab 04/03/15 0130 04/04/15 0504 04/05/15 0458  NA 140 140 143  K 3.6 4.0 3.6  CL 108 107 106  CO2 22 28 31   BUN 23* 23* 29*  CREATININE 1.43* 1.27* 1.32*  GLUCOSE 203* 151* 119*   Electrolytes  Recent Labs Lab 04/03/15 0130 04/03/15 0143 04/04/15 0504 04/05/15 0458  CALCIUM 8.2*  --  7.8* 8.0*  MG  --  2.0 2.2 2.4  PHOS 1.4*  --  3.0 2.6   Sepsis Markers  Recent Labs Lab 03/31/15 1937 03/31/15 2213 04/02/15 1015  LATICACIDVEN 1.4 1.6 1.6  PROCALCITON 2.83  --   --    ABG  Recent Labs Lab 04/02/15 1034 04/02/15 1348 04/04/15 0350  PHART 7.141* 7.368 7.369  PCO2ART 78.6* 37.7 43.9  PO2ART 419.0* 157.0* 147*   Liver Enzymes  Recent Labs Lab 03/31/15 1100 03/31/15 1937 04/02/15 1015 04/03/15 0130  AST 33 27 36  --   ALT 26 20 24   --   ALKPHOS 45 39 57  --   BILITOT 0.6 0.7 0.7  --   ALBUMIN 3.3* 2.8* 2.8* 2.3*   Cardiac Enzymes  Recent Labs Lab 04/03/15 1916 04/04/15 0100 04/04/15 0754  TROPONINI 0.54* 0.49* 0.39*   Glucose  Recent Labs Lab  04/04/15 1253 04/04/15 1645 04/04/15 2106 04/04/15 2332 04/05/15 0422 04/05/15 0833  GLUCAP 167* 146* 301* 217* 121* 145*   Imaging No results found. I reviewed CXR 9/3 myself with evidence of pulmonary edema.  ASSESSMENT / PLAN:  PULMONARY OETT 9/1>>>9/3 Acute respiratory failure  AECOPD Recurrent aspiration PNA/pneumonitis v HCAP CXR shows pulmonary edema but agree with Cards note of 9/4 there is active wheeze. Current steroids and nebs should catch up with him but need to prevent repeated aspiration and correct the pulmonary edema. P:   - Extubated and doing fair  - Titrate O2 for sat of 88-92%. - Abx as below. - Systemic steroids. - BD's. - Diuresis as below. -CXR 9/5  CARDIOVASCULAR Hx CAD  sCHF - EF 35%  Hypotension - mild.  Baseline SBP ~100 per son   P:  - Hold losartan for now  - Troponin positive and managed by cardiology. - EF down to 10-15%,cards to manage.  RENAL CKD III - stable  P:   F/u chem. BMET in AM. Replace electrolytes as indicated. Lasix 40 mg IV q8 x2 doses.  GASTROINTESTINAL Dysphagia with recurrent aspiration PNA  Achalasia  P:   GI following - tentative plan was for EGD with botox but held for now. PPI. TF per nutrition. Swallow evaluation.  HEMATOLOGIC No active issue  P:  SQ heparin.  INFECTIOUS HCAP v recurrent aspiration PNA. Setting strongly favors recurrent aspiration leading to repeated hospitalizations. P:   Vanc 9/1>>> Zosyn 9/1>>>  F/u on cultures.  ENDOCRINE DM  P:   SSI, lantus   NEUROLOGIC AMS - r/t hypercarbia.  P:   Avoid oversedation Daily WUA   FAMILY  - Updates:  Family updated bedside, transfer to SDU and to [facility] service with PCCM off 9/4.  Discussed with TRH-MD.  - Inter-disciplinary family meet or Palliative Care meeting due by:  9/8  CD Annamaria Boots, M.D. [facility] Pulmonary/Critical Care Medicine. Pager: [phone] After hours pager: [phone].

## 2015-04-05 NOTE — Evaluation (Signed)
Physical Therapy Evaluation Patient Details Name: ABBAS BEYENE MRN: [id number] DOB: 1934-01-25 Today's Date: 04/05/2015   History of Present Illness  Chia WANDELL SCULLION is a 81 y.o. male with a past medical history of chronic systolic congestive heart failure having ejection fraction 35-40%, chronic kidney disease, established history of coronary artery disease; who was recently admitted and discharge on 8/23 due to aspiration PNA and achalasia; who returned today to ED secondary to increasing cough and shortness of breath. Patient has RLL PNA on CXR and also WBc's 18.1, temp 101.2, HR 124, RR 39; lactic acid 2.4. Which appears to be secondary to aspiration again. Patient would be admitted due to sepsis 2/2 PNA (HCAP vs aspiration). Pt with code blue for respiratory distress this admission and intubated on 04-02-15 for one day. Extubated 04-03-15.  Clinical Impression   Pt admitted with above diagnosis. Pt currently with functional limitations due to the deficits listed below (see PT Problem List).  Pt will benefit from skilled PT to increase their independence and safety with mobility to allow discharge to the venue listed below.       Follow Up Recommendations Home health PT;Supervision/Assistance - 24 hour    Equipment Recommendations  Rolling walker with 5" wheels;3in1 (PT)    Recommendations for Other Services OT consult     Precautions / Restrictions Precautions Precautions: Fall Precaution Comments: desats on room Air      Mobility  Bed Mobility                  Transfers Overall transfer level: Needs assistance Equipment used: Rolling walker (2 wheeled) Transfers: Sit to/from Stand Sit to Stand: Min assist         General transfer comment: min steady assist  Ambulation/Gait Ambulation/Gait assistance: Min guard Ambulation Distance (Feet): 60 Feet Assistive device: Rolling walker (2 wheeled) Gait Pattern/deviations: Step-through pattern;Trunk flexed      General Gait Details: Occasional cues for RW proximity as well as to self-monitor for activity tolerance  Stairs            Wheelchair Mobility    Modified Rankin (Stroke Patients Only)       Balance                                             Pertinent Vitals/Pain Pain Assessment: No/denies pain    Home Living Family/patient expects to be discharged to:: Private residence Living Arrangements: Alone Available Help at Discharge: Family Type of Home: House Home Access: Stairs to enter Entrance Stairs-Rails: Left Entrance Stairs-Number of Steps: 3 Home Layout: Two level;Able to live on main level with bedroom/bathroom Home Equipment: Grab bars - toilet;Grab bars - tub/shower (cbg machine)      Prior Function Level of Independence: Independent               Hand Dominance        Extremity/Trunk Assessment   Upper Extremity Assessment: Overall WFL for tasks assessed           Lower Extremity Assessment: Generalized weakness         Communication   Communication: HOH  Cognition Arousal/Alertness: Awake/alert Behavior During Therapy: WFL for tasks assessed/performed Overall Cognitive Status: Within Functional Limits for tasks assessed                      General Comments  General comments (skin integrity, edema, etc.):  SATURATION QUALIFICATIONS: (This note is used to comply with regulatory documentation for home oxygen)  Patient Saturations on Room Air at Rest = 89%  Patient Saturations on Room Air while Ambulating = 86%  Patient Saturations on 4 Liters of oxygen while Ambulating = 91%  Please briefly explain why patient needs home oxygen: Patient requires supplemental oxygen to maintain oxygen saturations at acceptable, safe levels with physical activity.    Exercises        Assessment/Plan    PT Assessment Patient needs continued PT services  PT Diagnosis Generalized weakness   PT Problem List  Decreased strength;Decreased activity tolerance;Decreased balance;Decreased mobility;Decreased knowledge of use of DME;Cardiopulmonary status limiting activity  PT Treatment Interventions DME instruction;Gait training;Stair training;Functional mobility training;Therapeutic activities;Therapeutic exercise;Balance training;Patient/family education   PT Goals (Current goals can be found in the Care Plan section) Acute Rehab PT Goals Patient Stated Goal: Would like to go home after upcoming procedure PT Goal Formulation: With patient Time For Goal Achievement: 04/19/15 Potential to Achieve Goals: Good    Frequency Min 3X/week   Barriers to discharge        Co-evaluation               End of Session Equipment Utilized During Treatment: Oxygen Activity Tolerance: Patient tolerated treatment well Patient left: in chair;with call bell/phone within reach;with family/visitor present Nurse Communication: Mobility status         Time: 7425-9563 PT Time Calculation (min) (ACUTE ONLY): 32 min   Charges:   PT Evaluation $Initial PT Evaluation Tier I: 1 Procedure PT Treatments $Gait Training: 8-22 mins   PT G Codes:        Quin Hoop 04/05/2015, 5:17 PM  Roney Marion, [place] Pager [phone] Office [phone]

## 2015-04-05 NOTE — Progress Notes (Signed)
    Subjective:  Denies CP or dyspnea; cough improving   Objective:  Filed Vitals:   04/05/15 0500 04/05/15 0600 04/05/15 0800 04/05/15 0900  BP:  124/78 135/67   Pulse:  85 91   Temp:   97.5 F (36.4 C)   TempSrc:   Oral   Resp:  22 27   Height:      Weight: 202 lb 13.2 oz (92 kg)     SpO2:  93% 100% 97%    Intake/Output from previous day:  Intake/Output Summary (Last 24 hours) at 04/05/15 5686 Last data filed at 04/05/15 0600  Gross per 24 hour  Intake   1510 ml  Output   3800 ml  Net  -2290 ml    Physical Exam: Physical exam: Well-developed frail in no acute distress.  Skin is warm and dry.  HEENT is normal.  Neck is supple.  Chest diffuse expiratory wheeze Cardiovascular exam is regular rate and rhythm.  Abdominal exam nontender or distended. No masses palpated. Extremities show trace ankle edema. neuro grossly intact    Lab Results: Basic Metabolic Panel:  Recent Labs  04/04/15 0504 04/05/15 0458  NA 140 143  K 4.0 3.6  CL 107 106  CO2 28 31  GLUCOSE 151* 119*  BUN 23* 29*  CREATININE 1.27* 1.32*  CALCIUM 7.8* 8.0*  MG 2.2 2.4  PHOS 3.0 2.6   CBC:  Recent Labs  04/02/15 1015 04/03/15 0143 04/04/15 0504 04/05/15 0458  WBC 18.6* 13.2* 17.8* 12.1*  NEUTROABS 16.1* 12.6*  --   --   HGB 13.5 11.3* 11.5* 11.2*  HCT 45.4 37.1* 37.4* 37.0*  MCV 91.3 87.1 87.6 87.3  PLT 226 182 173 193   Cardiac Enzymes:  Recent Labs  04/03/15 1916 04/04/15 0100 04/04/15 0754  TROPONINI 0.54* 0.49* 0.39*     Assessment/Plan:  1 acute on chronic systolic congestive heart failure-volume status appears to be reasonable. Add Lasix 40 mg daily. Follow renal function. 2 ischemic cardiomyopathy-I will resume Cozaar 25 mg daily. He has not been on a beta blocker as an outpatient as apparently he did not tolerate carvedilol, has orthostatic symptoms and COPD. This can be readdressed when he follows up with Dr. [place]ique. 3 elevated troponin-minimal elevation  felt not to be consistent with acute coronary syndrome. No plans for further ischemia evaluation at this point. Can consider nuclear study as an outpatient for risk stratification once he improves. 4 pneumonia-continue anti-biotics. 5 COPD-patient continues to wheeze on examination. Continue steroid-dependent bronchial dilators. Critical care managing. 6 CAD-continue ASA and statin.  Kirk Ruths 04/05/2015, 9:28 AM

## 2015-04-05 NOTE — Progress Notes (Signed)
[place] TEAM 1 - Stepdown/ICU TEAM PROGRESS NOTE  Antonio Ray TRV:[id number] DOB: 1933/10/05 DOA: 03/31/2015 PCP: [name], MD  Admit HPI / Brief Narrative: 81 y.o. male with a history of chronic systolic congestive heart failure having ejection fraction 35-40%, chronic kidney disease, and coronary artery disease who was discharge on 8/23 after tx for aspiration PNA and achalasia only to return to the ED 8/30 secondary to increasing cough and shortness of breath. In the ED the patient had RLL infiltrate on CXR and WBc's 18.1, temp 101.2, HR 124, RR 39; lactic acid 2.4. He was admitted the the hospital, was being tx for possible HCAP v/s probable simple aspiration pneumonitis, and had stabilized nicely.  He was tx from SDU to floor, and on 9/1 pts son found him lethargic w/ agonal respirations. The son bagged him, a code blue was called, and he was subsequently intubated by anesthesia.  PCCM assumed his care upon his arrival to the MICU.  In the ICU, the pt self-extubated 9/2>9/3 night/early morning.  He remained stable off the vent, and was not re-intubated.  He was transferred to the SDU on 9/3, and TRH resumed his care on 9/4.    HPI/Subjective: The patient is alert and oriented.  He denies any complaints whatsoever at this time.  He is anxious to be discharged home.  He states he is tolerating his clear liquid diet without any difficulty at this time.  Assessment/Plan:  Acute hypoxic respiratory arrest 9/1 Pt found apneic in his hospital room by his son, who himself initiated Code Blue procedure - appears to have been a pure respiratory event - intubated 9/1 and self extubated 9/3  Sepsis due to RLL aspiration PNA/pneumonitis Sepsis physiology has resolved - continue empiric antibiotic therapy for a planned 7 day course as this likely represents pneumonitis rather than a true pneumonia   Achalasia GI planning for EGD/Botox when clinically stable - process delayed by his resp  arrest 9/1 - appears will be stable for procedure this week   Acute on Chronic systolic congestive heart failure Previous EF 30-35% June 2016 - TTE this admit notes decline in EF to 10-15% - recent discharge weight of 83 kg > current weight 92 kg - follow I's/O's and daily weights - Cardiology following - continue diuresis - reportedly unable to tolerate BB due to orthostasis  Mildly elevated troponin Cardiology does not feel this is indicative of ACS   DM 2 CBG currently reasonably controlled  CAD PCI to LAD 2006 - follow-up cardiac cath 2007 with no intervention required  COPD w/ acute bronchospastic exacerbation  Steroids and nebs as per Pulmonary - no wheezing at the time of my exam today  CKD stage III Baseline creatinine appears to be approximately 1.6-1.9 - creatinine presently better than baseline  Code Status: FULL Family Communication: Spoke with daughter at bedside at length Disposition Plan: SDU  Consultants: [facility]  PCCM  Procedures: none  Antibiotics: Zosyn 8/30 > Vancomycin 8/30  DVT prophylaxis: Subcutaneous heparin  Objective: Blood pressure 135/67, pulse 91, temperature 97.5 F (36.4 C), temperature source Oral, resp. rate 27, height 5\' 10"  (1.778 m), weight 92 kg (202 lb 13.2 oz), SpO2 97 %.   Intake/Output Summary (Last 24 hours) at 04/05/15 1344 Last data filed at 04/05/15 0600  Gross per 24 hour  Intake   1450 ml  Output   2750 ml  Net  -1300 ml   Exam: General: No acute respiratory distress - alert/conversant  Lungs: Mild bibasilar crackles with good air movement throughout other fields with no appreciable wheeze - some coarse upper airway sounds transmitted throughout Cardiovascular: Regular rate and rhythm without murmur gallop or rub  Abdomen: Nontender, nondistended, soft, bowel sounds positive, no rebound, no ascites, no appreciable mass Extremities: No significant cyanosis, or clubbing;  diffuse 1+ edema bilateral  lower extremities  Data Reviewed: Basic Metabolic Panel:  Recent Labs Lab 04/02/15 0649 04/02/15 1015 04/03/15 0130 04/03/15 0143 04/04/15 0504 04/05/15 0458  NA 139 140 140  --  140 143  K 4.3 5.9* 3.6  --  4.0 3.6  CL 106 109 108  --  107 106  CO2 27 23 22   --  28 31  GLUCOSE 181* 206* 203*  --  151* 119*  BUN 20 23* 23*  --  23* 29*  CREATININE 1.16 1.45* 1.43*  --  1.27* 1.32*  CALCIUM 8.5* 8.2* 8.2*  --  7.8* 8.0*  MG  --   --   --  2.0 2.2 2.4  PHOS  --   --  1.4*  --  3.0 2.6    CBC:  Recent Labs Lab 03/31/15 1100 03/31/15 1937  04/02/15 0649 04/02/15 1015 04/03/15 0143 04/04/15 0504 04/05/15 0458  WBC 18.4* 25.7*  < > 21.3* 18.6* 13.2* 17.8* 12.1*  NEUTROABS 16.7* 22.3*  --   --  16.1* 12.6*  --   --   HGB 13.0 11.5*  < > 12.6* 13.5 11.3* 11.5* 11.2*  HCT 43.1 38.1*  < > 42.2 45.4 37.1* 37.4* 37.0*  MCV 88.1 87.8  < > 89.6 91.3 87.1 87.6 87.3  PLT 257 222  < > 274 226 182 173 193  < > = values in this interval not displayed.  Liver Function Tests:  Recent Labs Lab 03/31/15 1100 03/31/15 1937 04/02/15 1015 04/03/15 0130  AST 33 27 36  --   ALT 26 20 24   --   ALKPHOS 45 39 57  --   BILITOT 0.6 0.7 0.7  --   PROT 7.3 6.1* 6.2*  --   ALBUMIN 3.3* 2.8* 2.8* 2.3*   Coags:  Recent Labs Lab 03/31/15 1937  INR 1.27    Recent Labs Lab 03/31/15 1937  APTT 30    CBG:  Recent Labs Lab 04/04/15 1645 04/04/15 2106 04/04/15 2332 04/05/15 0422 04/05/15 0833  GLUCAP 146* 301* 217* 121* 145*    Recent Results (from the past 240 hour(s))  Blood Culture (routine x 2)     Status: None   Collection Time: 03/31/15 11:00 AM  Result Value Ref Range Status   Specimen Description BLOOD LEFT ANTECUBITAL  Final   Special Requests BOTTLES DRAWN AEROBIC AND ANAEROBIC 5ML  Final   Culture NO GROWTH 5 DAYS  Final   Report Status 04/05/2015 FINAL  Final  Blood Culture (routine x 2)     Status: None   Collection Time: 03/31/15 11:17 AM  Result Value  Ref Range Status   Specimen Description BLOOD LEFT ARM  Final   Special Requests BOTTLES DRAWN AEROBIC AND ANAEROBIC 5ML  Final   Culture NO GROWTH 5 DAYS  Final   Report Status 04/05/2015 FINAL  Final  Urine culture     Status: None   Collection Time: 03/31/15 11:40 AM  Result Value Ref Range Status   Specimen Description URINE, CLEAN CATCH  Final   Special Requests NONE  Final   Culture NO GROWTH 1 DAY  Final   Report  Status 04/01/2015 FINAL  Final  MRSA PCR Screening     Status: None   Collection Time: 03/31/15  5:09 PM  Result Value Ref Range Status   MRSA by PCR NEGATIVE NEGATIVE Final    Comment:        The GeneXpert MRSA Assay (FDA approved for NASAL specimens only), is one component of a comprehensive MRSA colonization surveillance program. It is not intended to diagnose MRSA infection nor to guide or monitor treatment for MRSA infections.   Culture, sputum-assessment     Status: None   Collection Time: 04/01/15  6:15 AM  Result Value Ref Range Status   Specimen Description SPUTUM  Final   Special Requests NONE  Final   Sputum evaluation   Final    THIS SPECIMEN IS ACCEPTABLE. RESPIRATORY CULTURE REPORT TO FOLLOW.   Report Status 04/01/2015 FINAL  Final  Culture, respiratory (NON-Expectorated)     Status: None   Collection Time: 04/01/15  6:15 AM  Result Value Ref Range Status   Specimen Description EXPECTORATED SPUTUM  Final   Special Requests NONE  Final   Gram Stain   Final    FEW WBC PRESENT, PREDOMINANTLY PMN RARE SQUAMOUS EPITHELIAL CELLS PRESENT RARE GRAM POSITIVE COCCI IN PAIRS RARE GRAM NEGATIVE COCCI RARE GRAM NEGATIVE RODS    Culture   Final    NORMAL OROPHARYNGEAL FLORA Performed at [facility]    Report Status 04/03/2015 FINAL  Final  MRSA PCR Screening     Status: None   Collection Time: 04/02/15 10:12 AM  Result Value Ref Range Status   MRSA by PCR NEGATIVE NEGATIVE Final    Comment:        The GeneXpert MRSA Assay  (FDA approved for NASAL specimens only), is one component of a comprehensive MRSA colonization surveillance program. It is not intended to diagnose MRSA infection nor to guide or monitor treatment for MRSA infections.      Studies:   Recent x-ray studies have been reviewed in detail by the Attending Physician  Scheduled Meds:  Scheduled Meds: . antiseptic oral rinse  7 mL Mouth Rinse QID  . aspirin  81 mg Per Tube Daily  . chlorhexidine gluconate  15 mL Mouth Rinse BID  . furosemide  40 mg Oral Daily  . heparin  5,000 Units Subcutaneous 3 times per day  . insulin aspart  0-9 Units Subcutaneous 6 times per day  . insulin glargine  8 Units Subcutaneous q morning - 10a  . ipratropium-albuterol  3 mL Nebulization Q6H  . losartan  25 mg Oral Daily  . methylPREDNISolone (SOLU-MEDROL) injection  40 mg Intravenous Q6H  . pantoprazole sodium  40 mg Per Tube Daily  . piperacillin-tazobactam (ZOSYN)  IV  3.375 g Intravenous Q8H  . pravastatin  40 mg Per Tube QPM  . vancomycin  750 mg Intravenous Q12H    Time spent on care of this patient: 35 mins   MCCLUNG,JEFFREY T , MD   [facility] Office  [phone] Pager - Text Page per Shea Evans as per below:  On-Call/Text Page:      [email or web address]      password [email or web address]  If 7PM-7AM, please contact night-coverage [email or web address] Password [email or web address] 04/05/2015, 1:44 PM   LOS: 5 days

## 2015-04-06 ENCOUNTER — Inpatient Hospital Stay (HOSPITAL_COMMUNITY): Payer: Medicare Other

## 2015-04-06 DIAGNOSIS — J189 Pneumonia, unspecified organism: Secondary | ICD-10-CM

## 2015-04-06 LAB — BASIC METABOLIC PANEL
ANION GAP: 5 (ref 5–15)
BUN: 28 mg/dL — ABNORMAL HIGH (ref 6–20)
CALCIUM: 8 mg/dL — AB (ref 8.9–10.3)
CO2: 34 mmol/L — AB (ref 22–32)
Chloride: 103 mmol/L (ref 101–111)
Creatinine, Ser: 1.26 mg/dL — ABNORMAL HIGH (ref 0.61–1.24)
GFR, EST NON AFRICAN AMERICAN: 52 mL/min — AB (ref >60)
Glucose, Bld: 195 mg/dL — ABNORMAL HIGH (ref 65–99)
Potassium: 4.4 mmol/L (ref 3.5–5.1)
SODIUM: 142 mmol/L (ref 135–145)

## 2015-04-06 LAB — GLUCOSE, CAPILLARY
GLUCOSE-CAPILLARY: 209 mg/dL — AB (ref 65–99)
GLUCOSE-CAPILLARY: 204 mg/dL — AB (ref 65–99)
GLUCOSE-CAPILLARY: 220 mg/dL — AB (ref 65–99)
Glucose-Capillary: 178 mg/dL — ABNORMAL HIGH (ref 65–99)
Glucose-Capillary: 263 mg/dL — ABNORMAL HIGH (ref 65–99)

## 2015-04-06 LAB — CBC
HCT: 37.3 % — ABNORMAL LOW (ref 39.0–52.0)
HEMOGLOBIN: 11.3 g/dL — AB (ref 13.0–17.0)
MCH: 26.5 pg (ref 26.0–34.0)
MCHC: 30.3 g/dL (ref 30.0–36.0)
MCV: 87.4 fL (ref 78.0–100.0)
Platelets: 198 10*3/uL (ref 150–400)
RBC: 4.27 MIL/uL (ref 4.22–5.81)
RDW: 14.5 % (ref 11.5–15.5)
WBC: 9.4 10*3/uL (ref 4.0–10.5)

## 2015-04-06 MED ORDER — METHYLPREDNISOLONE SODIUM SUCC 40 MG IJ SOLR
40.0000 mg | Freq: Two times a day (BID) | INTRAMUSCULAR | Status: DC
  Administered 2015-04-06 – 2015-04-07 (×3): 40 mg via INTRAVENOUS
  Filled 2015-04-06 (×4): qty 1

## 2015-04-06 MED ORDER — INSULIN GLARGINE 100 UNIT/ML ~~LOC~~ SOLN
12.0000 [IU] | Freq: Every morning | SUBCUTANEOUS | Status: DC
  Administered 2015-04-07 – 2015-04-10 (×4): 12 [IU] via SUBCUTANEOUS
  Filled 2015-04-06 (×5): qty 0.12

## 2015-04-06 MED ORDER — PANTOPRAZOLE SODIUM 40 MG PO TBEC
40.0000 mg | DELAYED_RELEASE_TABLET | Freq: Every day | ORAL | Status: DC
  Administered 2015-04-06 – 2015-04-11 (×6): 40 mg via ORAL
  Filled 2015-04-06 (×5): qty 1

## 2015-04-06 NOTE — Progress Notes (Signed)
Speech Language Pathology Treatment: Dysphagia  [name] MRN: [id number] DOB: 02-09-1934 Today's Date: 04/06/2015 Time: 8828-0034 SLP Time Calculation (min) (ACUTE ONLY): 14 min  Assessment / Plan / Recommendation Clinical Impression  Pt seen for dysphagia therapy with daughter at bedside. Vocal quality hoarse from brief intubation and self extubation. Required min verbal cues for small sips with explanation/clinical reasoning to use extra caution with straw. No indications of aspiration, belched x 2. MD states pt to have Botox due to esophageal achalasia. Continue clear liquid diet.    HPI Other Pertinent Information: Antonio Ray is a 81 y.o. male with a past medical history of chronic systolic congestive heart failure having ejection fraction 35-40%, chronic kidney disease, established history of coronary artery disease; who was recently admitted and discharge on 8/23 due to aspiration PNA and achalasia; who returned today to ED secondary to increasing cough and shortness of breath. Patient has RLL PNA on CXR and also WBc's 18.1, temp 101.2, HR 124, RR 39; lactic acid 2.4. Which appears to be secondary to aspiration again. Patient would be admitted due to sepsis 2/2 PNA (HCAP vs aspiration). Pt with code blue for respiratory distress this admission and intubated on 04-02-15 for one day. Extubated 04-03-15. Bedside swallow eval ordered post-self extubation and with recurring aspiration PNA to determine safest diet.   Pertinent Vitals Pain Assessment: No/denies pain  SLP Plan  Continue with current plan of care    Recommendations Diet recommendations: Thin liquid (clear liquids) Liquids provided via: Cup;Straw Medication Administration: Whole meds with liquid Supervision: Patient able to self feed;Intermittent supervision to cue for compensatory strategies Compensations: Small sips/bites;Slow rate Postural Changes and/or Swallow Maneuvers: Out of bed for meals;Seated  upright 90 degrees;Upright 30-60 min after meal              Oral Care Recommendations: Oral care BID Follow up Recommendations:  (home health or outpt ST) Plan: Continue with current plan of care    GO     Houston Siren 04/06/2015, 2:36 PM  Orbie Pyo Colvin Caroli.Ed [employer] [phone]

## 2015-04-06 NOTE — Progress Notes (Addendum)
    Subjective:  Denies CP or dyspnea; cough improving Denies CP   Objective:  Filed Vitals:   04/06/15 0000 04/06/15 0400 04/06/15 0801 04/06/15 0817  BP: 126/53 133/72  125/78  Pulse: 66 119  89  Temp:  97.9 F (36.6 C)  97.8 F (36.6 C)  TempSrc:  Oral  Oral  Resp: 25 22  21   Height:      Weight:      SpO2: 98% 98% 96% 93%    Intake/Output from previous day:  Intake/Output Summary (Last 24 hours) at 04/06/15 0934 Last data filed at 04/06/15 0857  Gross per 24 hour  Intake   2370 ml  Output   3175 ml  Net   -805 ml    Physical Exam: Physical exam: Well-developed frail in no acute distress.  Skin is warm and dry.  HEENT is normal.  Neck is supple. L IV catheter in place Chest diffuse expiratory wheeze Cardiovascular exam is regular rate and rhythm.  Abdominal exam nontender or distended. No masses palpated. Extremities show trace ankle edema. neuro grossly intact    Lab Results: Basic Metabolic Panel:  Recent Labs  04/04/15 0504 04/05/15 0458 04/06/15 0530  NA 140 143 142  K 4.0 3.6 4.4  CL 107 106 103  CO2 28 31 34*  GLUCOSE 151* 119* 195*  BUN 23* 29* 28*  CREATININE 1.27* 1.32* 1.26*  CALCIUM 7.8* 8.0* 8.0*  MG 2.2 2.4  --   PHOS 3.0 2.6  --    CBC:  Recent Labs  04/05/15 0458 04/06/15 0530  WBC 12.1* 9.4  HGB 11.2* 11.3*  HCT 37.0* 37.3*  MCV 87.3 87.4  PLT 193 198   Cardiac Enzymes:  Recent Labs  04/03/15 1916 04/04/15 0100 04/04/15 0754  TROPONINI 0.54* 0.49* 0.39*     Assessment/Plan:  1 acute on chronic systolic congestive heart failure-volume status appears to be reasonable. Continue Lasix 40 mg daily. Follow renal function.  CVP 10-12 2 ischemic cardiomyopathy-I will resume Cozaar 25 mg daily. He has not been on a beta blocker as an outpatient as apparently he did not tolerate carvedilol, has orthostatic symptoms and COPD. This can be readdressed when he follows up with Dr. Martinique. 3 elevated troponin-minimal  elevation felt not to be consistent with acute coronary syndrome. No plans for further ischemia evaluation at this point. Can consider nuclear study as an outpatient for risk stratification once he improves. 4 pneumonia-continue anti-biotics. 5 COPD-patient continues to wheeze on examination. Continue steroid-dependent bronchial dilators. Critical care managing. 6 CAD-continue ASA and statin.  Will interrogate ICD tomorrow Will need to get central line out ASAP to minimize risks of bacteremia which would be catastrophic with his ICD in place  Thompson Grayer 04/06/2015, 9:34 AM

## 2015-04-06 NOTE — Progress Notes (Signed)
[place] TEAM 1 - Stepdown/ICU TEAM PROGRESS NOTE  Antonio Ray MGQ:[id number] DOB: 03-Apr-1934 DOA: 03/31/2015 PCP: [name], MD  Admit HPI / Brief Narrative: 81 y.o. male with a history of chronic systolic congestive heart failure having ejection fraction 35-40%, chronic kidney disease, and coronary artery disease who was discharge on 8/23 after tx for aspiration PNA and achalasia only to return to the ED 8/30 secondary to increasing cough and shortness of breath. In the ED the patient had RLL infiltrate on CXR and WBc's 18.1, temp 101.2, HR 124, RR 39; lactic acid 2.4. He was admitted the the hospital, was being tx for possible HCAP v/s probable simple aspiration pneumonitis, and had stabilized nicely.  He was tx from SDU to floor, and on 9/1 pts son found him lethargic w/ agonal respirations. The son bagged him, a code blue was called, and he was subsequently intubated by anesthesia.  PCCM assumed his care upon his arrival to the MICU.  In the ICU, the pt self-extubated 9/2>9/3 night/early morning.  He remained stable off the vent, and was not re-intubated.  He was transferred to the SDU on 9/3, and TRH resumed his care on 9/4.    HPI/Subjective: The patient remains anxious to get out of the hospital.  He denies chest pain shortness breath fevers or chills.  He has a thick hectic productive cough that continues.  Assessment/Plan:  Acute hypoxic respiratory arrest 9/1 - resolved Pt found apneic in his hospital room by his son, who himself initiated Code Blue procedure - appears to have been a pure respiratory event - intubated 9/1 and self extubated 9/3  Sepsis due to RLL aspiration PNA/pneumonitis Sepsis physiology has resolved - completed empiric antibiotic for a 7 day course - this likely represents pneumonitis rather than a true pneumonia - CXR in f/u is stable   Achalasia GI planning for EGD/Botox when clinically stable - process delayed by his resp arrest 9/1 - appears will  be stable for procedure this week - will re-consult GI in AM 9/6  Acute on Chronic systolic congestive heart failure Previous EF 30-35% June 2016 - TTE this admit notes decline in EF to 10-15% - recent discharge weight of 83 kg > current weight 92 kg - follow I's/O's and daily weights - Cardiology following - continue diuresis - reportedly unable to tolerate BB due to orthostasis  Mildly elevated troponin Cardiology does not feel this is indicative of ACS   DM 2 CBG climbing - adjust tx gently and follow trend   CAD PCI to LAD 2006 - follow-up cardiac cath 2007 with no intervention required  COPD w/ acute bronchospastic exacerbation  Steroids and nebs - no wheezing at the time of my exam today - begin to wean steroids in AM and follow   CKD stage III Baseline creatinine appears to be approximately 1.6-1.9 - creatinine presently better than baseline  Code Status: FULL Family Communication: Spoke with daughter at bedside Disposition Plan: SDU  Consultants: [facility]  PCCM  Procedures: none  Antibiotics: Zosyn 8/30 > 9/5 Vancomycin 8/30 > 9/5  DVT prophylaxis: Subcutaneous heparin  Objective: Blood pressure 127/73, pulse 87, temperature 97.9 F (36.6 C), temperature source Oral, resp. rate 24, height 5\' 10"  (1.778 m), weight 92 kg (202 lb 13.2 oz), SpO2 96 %.   Intake/Output Summary (Last 24 hours) at 04/06/15 1505 Last data filed at 04/06/15 1400  Gross per 24 hour  Intake   2610 ml  Output  3300 ml  Net   -690 ml   Exam: General: No acute respiratory distress - alert Lungs: Mild bibasilar crackles with good air movement throughout other fields - no appreciable wheeze - some coarse upper airway sounds transmitted throughout w/ course cough  Cardiovascular: Regular rate and rhythm without murmur gallop rub  Abdomen: Nontender, nondistended, soft, bowel sounds positive, no rebound, no ascites, no appreciable mass Extremities: No significant  cyanosis, or clubbing;  diffuse 1+ edema bilateral lower extremities  Data Reviewed: Basic Metabolic Panel:  Recent Labs Lab 04/02/15 1015 04/03/15 0130 04/03/15 0143 04/04/15 0504 04/05/15 0458 04/06/15 0530  NA 140 140  --  140 143 142  K 5.9* 3.6  --  4.0 3.6 4.4  CL 109 108  --  107 106 103  CO2 23 22  --  28 31 34*  GLUCOSE 206* 203*  --  151* 119* 195*  BUN 23* 23*  --  23* 29* 28*  CREATININE 1.45* 1.43*  --  1.27* 1.32* 1.26*  CALCIUM 8.2* 8.2*  --  7.8* 8.0* 8.0*  MG  --   --  2.0 2.2 2.4  --   PHOS  --  1.4*  --  3.0 2.6  --     CBC:  Recent Labs Lab 03/31/15 1100 03/31/15 1937  04/02/15 1015 04/03/15 0143 04/04/15 0504 04/05/15 0458 04/06/15 0530  WBC 18.4* 25.7*  < > 18.6* 13.2* 17.8* 12.1* 9.4  NEUTROABS 16.7* 22.3*  --  16.1* 12.6*  --   --   --   HGB 13.0 11.5*  < > 13.5 11.3* 11.5* 11.2* 11.3*  HCT 43.1 38.1*  < > 45.4 37.1* 37.4* 37.0* 37.3*  MCV 88.1 87.8  < > 91.3 87.1 87.6 87.3 87.4  PLT 257 222  < > 226 182 173 193 198  < > = values in this interval not displayed.  Liver Function Tests:  Recent Labs Lab 03/31/15 1100 03/31/15 1937 04/02/15 1015 04/03/15 0130  AST 33 27 36  --   ALT 26 20 24   --   ALKPHOS 45 39 57  --   BILITOT 0.6 0.7 0.7  --   PROT 7.3 6.1* 6.2*  --   ALBUMIN 3.3* 2.8* 2.8* 2.3*   Coags:  Recent Labs Lab 03/31/15 1937  INR 1.27    Recent Labs Lab 03/31/15 1937  APTT 30    CBG:  Recent Labs Lab 04/05/15 2019 04/05/15 2341 04/06/15 0419 04/06/15 0819 04/06/15 1241  GLUCAP 196* 208* 209* 178* 263*    Recent Results (from the past 240 hour(s))  Blood Culture (routine x 2)     Status: None   Collection Time: 03/31/15 11:00 AM  Result Value Ref Range Status   Specimen Description BLOOD LEFT ANTECUBITAL  Final   Special Requests BOTTLES DRAWN AEROBIC AND ANAEROBIC 5ML  Final   Culture NO GROWTH 5 DAYS  Final   Report Status 04/05/2015 FINAL  Final  Blood Culture (routine x 2)     Status: None     Collection Time: 03/31/15 11:17 AM  Result Value Ref Range Status   Specimen Description BLOOD LEFT ARM  Final   Special Requests BOTTLES DRAWN AEROBIC AND ANAEROBIC 5ML  Final   Culture NO GROWTH 5 DAYS  Final   Report Status 04/05/2015 FINAL  Final  Urine culture     Status: None   Collection Time: 03/31/15 11:40 AM  Result Value Ref Range Status   Specimen Description URINE, CLEAN  CATCH  Final   Special Requests NONE  Final   Culture NO GROWTH 1 DAY  Final   Report Status 04/01/2015 FINAL  Final  MRSA PCR Screening     Status: None   Collection Time: 03/31/15  5:09 PM  Result Value Ref Range Status   MRSA by PCR NEGATIVE NEGATIVE Final    Comment:        The GeneXpert MRSA Assay (FDA approved for NASAL specimens only), is one component of a comprehensive MRSA colonization surveillance program. It is not intended to diagnose MRSA infection nor to guide or monitor treatment for MRSA infections.   Culture, sputum-assessment     Status: None   Collection Time: 04/01/15  6:15 AM  Result Value Ref Range Status   Specimen Description SPUTUM  Final   Special Requests NONE  Final   Sputum evaluation   Final    THIS SPECIMEN IS ACCEPTABLE. RESPIRATORY CULTURE REPORT TO FOLLOW.   Report Status 04/01/2015 FINAL  Final  Culture, respiratory (NON-Expectorated)     Status: None   Collection Time: 04/01/15  6:15 AM  Result Value Ref Range Status   Specimen Description EXPECTORATED SPUTUM  Final   Special Requests NONE  Final   Gram Stain   Final    FEW WBC PRESENT, PREDOMINANTLY PMN RARE SQUAMOUS EPITHELIAL CELLS PRESENT RARE GRAM POSITIVE COCCI IN PAIRS RARE GRAM NEGATIVE COCCI RARE GRAM NEGATIVE RODS    Culture   Final    NORMAL OROPHARYNGEAL FLORA Performed at [facility]    Report Status 04/03/2015 FINAL  Final  MRSA PCR Screening     Status: None   Collection Time: 04/02/15 10:12 AM  Result Value Ref Range Status   MRSA by PCR NEGATIVE NEGATIVE Final     Comment:        The GeneXpert MRSA Assay (FDA approved for NASAL specimens only), is one component of a comprehensive MRSA colonization surveillance program. It is not intended to diagnose MRSA infection nor to guide or monitor treatment for MRSA infections.      Studies:   Recent x-ray studies have been reviewed in detail by the Attending Physician  Scheduled Meds:  Scheduled Meds: . antiseptic oral rinse  7 mL Mouth Rinse QID  . aspirin  81 mg Oral Daily  . chlorhexidine gluconate  15 mL Mouth Rinse BID  . donepezil  10 mg Oral q morning - 10a  . furosemide  40 mg Oral Daily  . heparin  5,000 Units Subcutaneous 3 times per day  . insulin aspart  0-9 Units Subcutaneous TID WC  . insulin glargine  8 Units Subcutaneous q morning - 10a  . ipratropium-albuterol  3 mL Nebulization QID  . losartan  25 mg Oral Daily  . methylPREDNISolone (SOLU-MEDROL) injection  40 mg Intravenous Q6H  . pantoprazole  40 mg Oral Daily  . piperacillin-tazobactam (ZOSYN)  IV  3.375 g Intravenous Q8H  . pravastatin  40 mg Oral QPM  . vancomycin  750 mg Intravenous Q12H    Time spent on care of this patient: 25 mins   MCCLUNG,JEFFREY T , MD   [facility] Office  [phone] Pager - Text Page per Shea Evans as per below:  On-Call/Text Page:      [email or web address]      password [email or web address]  If 7PM-7AM, please contact night-coverage [email or web address] Password [email or web address] 04/06/2015, 3:05 PM   LOS: 6 days

## 2015-04-06 NOTE — Progress Notes (Signed)
PULMONARY / CRITICAL CARE MEDICINE   Name: Antonio Ray MRN: [id number] DOB: 1934/04/16    ADMISSION DATE:  03/31/2015 CONSULTATION DATE:  9/1  REFERRING MD :  Earlie Counts (triad)   CHIEF COMPLAINT:  Respiratory arrest   INITIAL PRESENTATION:  81 yo male former smoker with multiple medical problems including CAD, chronic sCHF, COPD, CKD, DM. Recently admitted 8/20-8/23 with AECOPD and ?aspiration PNA.  Returned 8/30 with increased cough and SOB.  Admitted with HCAP v aspiration PNA.  Was progressing, tx from SDU to floor. On 9/1 pts son found him lethargic, agonal respirations.  He bagged him, code blue was called and pt intubated by anesthesia, tx to ICU.   STUDIES:  2D echo 9/1>>>  SIGNIFICANT EVENTS: 8/20-8/23 admit for AECOPD, aspiration PNA  8/30 readmitted   SUBJECTIVE: Family in room. Pt thinks both incentive spirometer and Flutter device help. Very little sputum now.  VITAL SIGNS: Temp:  [97.7 F (36.5 C)-98.4 F (36.9 C)] 97.8 F (36.6 C) (09/05 0817) Pulse Rate:  [47-119] 89 (09/05 0817) Resp:  [21-30] 21 (09/05 0817) BP: (125-142)/(53-78) 125/78 mmHg (09/05 0817) SpO2:  [93 %-100 %] 93 % (09/05 0817)   HEMODYNAMICS: CVP:  [0 mmHg-12 mmHg] 12 mmHg   VENTILATOR SETTINGS:     INTAKE / OUTPUT:  Intake/Output Summary (Last 24 hours) at 04/06/15 1010 Last data filed at 04/06/15 0857  Gross per 24 hour  Intake   2370 ml  Output   3175 ml  Net   -805 ml   PHYSICAL EXAMINATION: General:  Chronically ill appearing male, NAD,  Neuro:  Awake, sits more easily on own today, nonfocal, appropriate responses HEENT:  Mm moist, no sig JVD,  Cardiovascular:  s1s2 rrr, regular Lungs:  Dry cough easily triggered by speech, laughter. Faint wheeze Abdomen:  Round, mildly distended, +bs  Musculoskeletal:  SCDs  LABS:  CBC  Recent Labs Lab 04/04/15 0504 04/05/15 0458 04/06/15 0530  WBC 17.8* 12.1* 9.4  HGB 11.5* 11.2* 11.3*  HCT 37.4* 37.0* 37.3*  PLT 173 193 198    Coag's  Recent Labs Lab 03/31/15 1937  APTT 30  INR 1.27   BMET  Recent Labs Lab 04/04/15 0504 04/05/15 0458 04/06/15 0530  NA 140 143 142  K 4.0 3.6 4.4  CL 107 106 103  CO2 28 31 34*  BUN 23* 29* 28*  CREATININE 1.27* 1.32* 1.26*  GLUCOSE 151* 119* 195*   Electrolytes  Recent Labs Lab 04/03/15 0130 04/03/15 0143 04/04/15 0504 04/05/15 0458 04/06/15 0530  CALCIUM 8.2*  --  7.8* 8.0* 8.0*  MG  --  2.0 2.2 2.4  --   PHOS 1.4*  --  3.0 2.6  --    Sepsis Markers  Recent Labs Lab 03/31/15 1937 03/31/15 2213 04/02/15 1015  LATICACIDVEN 1.4 1.6 1.6  PROCALCITON 2.83  --   --    ABG  Recent Labs Lab 04/02/15 1034 04/02/15 1348 04/04/15 0350  PHART 7.141* 7.368 7.369  PCO2ART 78.6* 37.7 43.9  PO2ART 419.0* 157.0* 147*   Liver Enzymes  Recent Labs Lab 03/31/15 1100 03/31/15 1937 04/02/15 1015 04/03/15 0130  AST 33 27 36  --   ALT 26 20 24   --   ALKPHOS 45 39 57  --   BILITOT 0.6 0.7 0.7  --   ALBUMIN 3.3* 2.8* 2.8* 2.3*   Cardiac Enzymes  Recent Labs Lab 04/03/15 1916 04/04/15 0100 04/04/15 0754  TROPONINI 0.54* 0.49* 0.39*   Glucose  Recent Labs Lab  04/05/15 0833 04/05/15 1250 04/05/15 1649 04/05/15 2019 04/05/15 2341 04/06/15 0419  GLUCAP 145* 224* 181* 196* 208* 209*   Imaging Dg Chest Port 1 View  04/06/2015   CLINICAL DATA:  Congestive heart failure.  Shortness of breath.  EXAM: PORTABLE CHEST - 1 VIEW  COMPARISON:  04/04/2015.  FINDINGS: Stable enlarged cardiac silhouette, left subclavian pacemaker leads and left jugular catheter. No significant change in bilateral lower lung zone airspace opacity and prominence of the pulmonary vasculature. A small left pleural effusion is noted. Lower thoracic spine degenerative changes.  IMPRESSION: 1. Stable cardiomegaly and pulmonary vascular congestion. 2. Stable alveolar edema or pneumonia in both lower lung zones. 3. Small left pleural effusion.   Electronically Signed   By: Claudie Revering M.D.   On: 04/06/2015 07:39   I reviewed CXR 9/5 myself, still with evidence of pulmonary edema.  ASSESSMENT / PLAN:  PULMONARY OETT 9/1>>>9/3 Acute respiratory failure  AECOPD Recurrent aspiration PNA/pneumonitis v HCAP CXR shows pulmonary edema but agree with Cards note of 9/4 there is active wheeze. Current steroids and nebs should catch up with him but need to prevent repeated aspiration and correct the pulmonary edema. P:   - Extubated and doing fair  - Titrate O2 for sat of 88-92%. - Abx as below. - Systemic steroids. - BD's. - Diuresis as below. -CXR 9/5  CARDIOVASCULAR Hx CAD  sCHF - EF 35%  Hypotension - mild.  Baseline SBP ~100 per son  P:  - Hold losartan for now  - Troponin positive and managed by cardiology. - EF down to 10-15%,cards to manage.  RENAL CKD III - stable  P:   F/u chem. Replace electrolytes as indicated. Lasix 40 mg IV q8 x2 doses.  GASTROINTESTINAL Dysphagia with recurrent aspiration PNA  Achalasia  P:   GI following - tentative plan was for EGD with botox but held for now. PPI. TF per nutrition. Swallow evaluation.  HEMATOLOGIC No active issue  P:  SQ heparin.  INFECTIOUS HCAP v recurrent aspiration PNA. Setting strongly favors recurrent aspiration leading to repeated hospitalizations. P:   Vanc 9/1>>> Zosyn 9/1>>>  F/u on cultures.  ENDOCRINE DM  P:   SSI, lantus   NEUROLOGIC AMS - r/t hypercarbia.  P:   Avoid oversedation Daily WUA   FAMILY    - Inter-disciplinary family meet or Palliative Care meeting due by:  9/8  [place] can sign off now, please call back if needed. Note cardiology asks removal of central line  CD Khyren Hing, M.D. [facility] Pulmonary/Critical Care Medicine. Pager: [phone] After hours pager: [phone].

## 2015-04-06 NOTE — Care Management Note (Signed)
Case Management Note  Patient Details  Name: Antonio Ray MRN: [id number] Date of Birth: January 21, 1934  Subjective/Objective:       Pt lives alone but dtr indicates she and other family members are planning to provide 24/7 assistance when pt is discharged.  Provided list of home health agencies, dtr wants to consider same as well as the equipment recommended by PT - RW and 3-N-1.             Expected Discharge Plan:  [place]  Discharge planning Services  CM Consult   Status of Service:  In process, will continue to follow  Girard Cooter, RN 04/06/2015, 2:10 PM

## 2015-04-07 ENCOUNTER — Encounter: Payer: Self-pay | Admitting: *Deleted

## 2015-04-07 DIAGNOSIS — A419 Sepsis, unspecified organism: Secondary | ICD-10-CM | POA: Diagnosis present

## 2015-04-07 DIAGNOSIS — Z9861 Coronary angioplasty status: Secondary | ICD-10-CM

## 2015-04-07 DIAGNOSIS — J690 Pneumonitis due to inhalation of food and vomit: Secondary | ICD-10-CM

## 2015-04-07 DIAGNOSIS — J984 Other disorders of lung: Secondary | ICD-10-CM | POA: Diagnosis present

## 2015-04-07 DIAGNOSIS — I2510 Atherosclerotic heart disease of native coronary artery without angina pectoris: Secondary | ICD-10-CM | POA: Diagnosis present

## 2015-04-07 DIAGNOSIS — Z95810 Presence of automatic (implantable) cardiac defibrillator: Secondary | ICD-10-CM

## 2015-04-07 DIAGNOSIS — E1165 Type 2 diabetes mellitus with hyperglycemia: Secondary | ICD-10-CM

## 2015-04-07 DIAGNOSIS — J438 Other emphysema: Secondary | ICD-10-CM

## 2015-04-07 DIAGNOSIS — I5021 Acute systolic (congestive) heart failure: Secondary | ICD-10-CM

## 2015-04-07 DIAGNOSIS — J189 Pneumonia, unspecified organism: Secondary | ICD-10-CM | POA: Diagnosis present

## 2015-04-07 DIAGNOSIS — I5023 Acute on chronic systolic (congestive) heart failure: Secondary | ICD-10-CM | POA: Diagnosis present

## 2015-04-07 LAB — GLUCOSE, CAPILLARY
GLUCOSE-CAPILLARY: 211 mg/dL — AB (ref 65–99)
GLUCOSE-CAPILLARY: 151 mg/dL — AB (ref 65–99)
Glucose-Capillary: 162 mg/dL — ABNORMAL HIGH (ref 65–99)
Glucose-Capillary: 161 mg/dL — ABNORMAL HIGH (ref 65–99)

## 2015-04-07 MED ORDER — SODIUM CHLORIDE 0.9 % IJ SOLN: 10.0000 mL | INTRAMUSCULAR | Status: DC | PRN

## 2015-04-07 MED ORDER — INSULIN ASPART 100 UNIT/ML ~~LOC~~ SOLN
0.0000 [IU] | SUBCUTANEOUS | Status: DC
  Administered 2015-04-07 (×2): 3 [IU] via SUBCUTANEOUS
  Administered 2015-04-07: 5 [IU] via SUBCUTANEOUS
  Administered 2015-04-08: 3 [IU] via SUBCUTANEOUS
  Administered 2015-04-08: 2 [IU] via SUBCUTANEOUS

## 2015-04-07 MED ORDER — SODIUM CHLORIDE 0.9 % IV SOLN: INTRAVENOUS | Status: DC

## 2015-04-07 MED ORDER — SODIUM CHLORIDE 0.9 % IJ SOLN
10.0000 mL | Freq: Two times a day (BID) | INTRAMUSCULAR | Status: DC
  Administered 2015-04-07 – 2015-04-08 (×2): 10 mL

## 2015-04-07 NOTE — Progress Notes (Signed)
    CC: "I did remarkably well on my walk today"  Subjective:  Denies CP or dyspnea; cough improving, plan AICD interrogation today per Dr. Rayann Heman.  Objective:  Filed Vitals:   04/07/15 0400 04/07/15 0500 04/07/15 0744 04/07/15 0800  BP: 134/75  129/69 129/71  Pulse:   74 73  Temp: 98 F (36.7 C)  97.8 F (36.6 C)   TempSrc: Oral  Oral   Resp: 21  27 23   Height:      Weight:  192 lb 6.4 oz (87.272 kg)    SpO2: 99%  98% 98%    Intake/Output from previous day:  Intake/Output Summary (Last 24 hours) at 04/07/15 0930 Last data filed at 04/07/15 0859  Gross per 24 hour  Intake   2400 ml  Output   3775 ml  Net  -1375 ml    Physical Exam: Physical exam: Well-developed frail in no acute distress.  Skin is warm and dry.  HEENT is normal.  Neck is supple. L IJ TLC in place. Lungs with diminished breath sounds at bases, no wheeze Cardiovascular exam is regular rate and rhythm.  Abdominal exam nontender or distended. No masses palpated. Extremities show trace ankle edema. neuro grossly intact  Lab Results: Basic Metabolic Panel:  Recent Labs  04/05/15 0458 04/06/15 0530  NA 143 142  K 3.6 4.4  CL 106 103  CO2 31 34*  GLUCOSE 119* 195*  BUN 29* 28*  CREATININE 1.32* 1.26*  CALCIUM 8.0* 8.0*  MG 2.4  --   PHOS 2.6  --    CBC:  Recent Labs  04/05/15 0458 04/06/15 0530  WBC 12.1* 9.4  HGB 11.2* 11.3*  HCT 37.0* 37.3*  MCV 87.3 87.4  PLT 193 198   Cardiac Enzymes: No results for input(s): CKTOTAL, CKMB, CKMBINDEX, TROPONINI in the last 72 hours.   Assessment/Plan:  1 acute on chronic systolic congestive heart failure-volume status appears to be reasonable. Continue Lasix 40 mg daily. Follow renal function.  CVP 10-12 2 ischemic cardiomyopathy- On Cozaar 25 mg daily. He has not been on a beta blocker as an outpatient as apparently he did not tolerate carvedilol, has orthostatic symptoms and COPD. This can be readdressed when he follows up with Dr.  [place]. 3 elevated troponin-minimal elevation felt not to be consistent with acute coronary syndrome. No plans for further ischemia evaluation at this point. Can consider nuclear study as an outpatient for risk stratification once he improves. 4 pneumonia-continue anti-biotics. 5 COPD-patient continues to wheeze on examination. Continue steroid-dependent bronchial dilators. Critical care managing. 6 CAD-continue ASA and statin.  Plan interrogate ICD today per EP to r/o AICD firing. Will need to get central line out ASAP to minimize risks of bacteremia which would be catastrophic with his ICD in place.  Pixie Casino, MD, [facility] Attending Cardiologist [place] 04/07/2015, 9:30 AM

## 2015-04-07 NOTE — Progress Notes (Signed)
ICD interrogation is reviewed and reveals normal ICD function.  NO arrhythmias Continue routine outpatient EP follow-up  Electrophysiology team to see as needed while here. Please call with questions.   Thompson Grayer MD, [facility] 04/07/2015 10:19 AM

## 2015-04-07 NOTE — Progress Notes (Signed)
Dr Sherral Hammers made aware of unsuccessful attempts of starting IV on pt, pt is at day 7 of his CVC. IV team recommending PICC line.

## 2015-04-07 NOTE — Progress Notes (Signed)
ANTIBIOTIC CONSULT NOTE - FOLLOW UP  Pharmacy Consult for Vancomycin and Zosyn Indication: r/o sepsis and recurrent PNA  Allergies  Allergen Reactions  . Codeine Other (See Comments)    Gets very angry, disoriented  . Dilaudid [Hydromorphone Hcl] Other (See Comments)    VERY AGITATED, HOSTILE  . Flomax [Tamsulosin Hcl] Shortness Of Breath  . Morphine And Related Other (See Comments)    VERY AGITATED, HOSTILE  . Sulfa Antibiotics Shortness Of Breath  . Beta Adrenergic Blockers     ? disorientation  . Carvedilol Other (See Comments)    DISORIENTATION  . Levofloxacin Other (See Comments)    unknown    Patient Measurements: Height: 5\' 10"  (177.8 cm) Weight: 192 lb 6.4 oz (87.272 kg) IBW/kg (Calculated) : 73  Vital Signs: Temp: 97.6 F (36.4 C) (09/06 1300) Temp Source: Oral (09/06 1300) BP: 126/65 mmHg (09/06 1300) Pulse Rate: 82 (09/06 1300) Intake/Output from previous day: 09/05 0701 - 09/06 0700 In: 2280 [P.O.:1520; I.V.:310; IV Piggyback:300] Out: 3775 [Urine:3775] Intake/Output from this shift: Total I/O In: 1510 [P.O.:1300; I.V.:10; IV Piggyback:200] Out: 900 [Urine:900]  Labs:  Recent Labs  04/05/15 0458 04/06/15 0530  WBC 12.1* 9.4  HGB 11.2* 11.3*  PLT 193 198  CREATININE 1.32* 1.26*   Estimated Creatinine Clearance: 49.1 mL/min (by C-G formula based on Cr of 1.26). No results for input(s): VANCOTROUGH, VANCOPEAK, VANCORANDOM, GENTTROUGH, GENTPEAK, GENTRANDOM, TOBRATROUGH, TOBRAPEAK, TOBRARND, AMIKACINPEAK, AMIKACINTROU, AMIKACIN in the last 72 hours.   Microbiology: Recent Results (from the past 720 hour(s))  Blood culture (routine x 2)     Status: None   Collection Time: 03/21/15 10:45 AM  Result Value Ref Range Status   Specimen Description BLOOD RIGHT ANTECUBITAL  Final   Special Requests BOTTLES DRAWN AEROBIC AND ANAEROBIC 10ML  Final   Culture NO GROWTH 5 DAYS  Final   Report Status 03/26/2015 FINAL  Final  Blood culture (routine x 2)      Status: None   Collection Time: 03/21/15 11:00 AM  Result Value Ref Range Status   Specimen Description BLOOD LEFT ANTECUBITAL  Final   Special Requests BOTTLES DRAWN AEROBIC AND ANAEROBIC 10ML  Final   Culture NO GROWTH 5 DAYS  Final   Report Status 03/26/2015 FINAL  Final  Blood Culture (routine x 2)     Status: None   Collection Time: 03/31/15 11:00 AM  Result Value Ref Range Status   Specimen Description BLOOD LEFT ANTECUBITAL  Final   Special Requests BOTTLES DRAWN AEROBIC AND ANAEROBIC 5ML  Final   Culture NO GROWTH 5 DAYS  Final   Report Status 04/05/2015 FINAL  Final  Blood Culture (routine x 2)     Status: None   Collection Time: 03/31/15 11:17 AM  Result Value Ref Range Status   Specimen Description BLOOD LEFT ARM  Final   Special Requests BOTTLES DRAWN AEROBIC AND ANAEROBIC 5ML  Final   Culture NO GROWTH 5 DAYS  Final   Report Status 04/05/2015 FINAL  Final  Urine culture     Status: None   Collection Time: 03/31/15 11:40 AM  Result Value Ref Range Status   Specimen Description URINE, CLEAN CATCH  Final   Special Requests NONE  Final   Culture NO GROWTH 1 DAY  Final   Report Status 04/01/2015 FINAL  Final  MRSA PCR Screening     Status: None   Collection Time: 03/31/15  5:09 PM  Result Value Ref Range Status   MRSA by PCR NEGATIVE  NEGATIVE Final    Comment:        The GeneXpert MRSA Assay (FDA approved for NASAL specimens only), is one component of a comprehensive MRSA colonization surveillance program. It is not intended to diagnose MRSA infection nor to guide or monitor treatment for MRSA infections.   Culture, sputum-assessment     Status: None   Collection Time: 04/01/15  6:15 AM  Result Value Ref Range Status   Specimen Description SPUTUM  Final   Special Requests NONE  Final   Sputum evaluation   Final    THIS SPECIMEN IS ACCEPTABLE. RESPIRATORY CULTURE REPORT TO FOLLOW.   Report Status 04/01/2015 FINAL  Final  Culture, respiratory  (NON-Expectorated)     Status: None   Collection Time: 04/01/15  6:15 AM  Result Value Ref Range Status   Specimen Description EXPECTORATED SPUTUM  Final   Special Requests NONE  Final   Gram Stain   Final    FEW WBC PRESENT, PREDOMINANTLY PMN RARE SQUAMOUS EPITHELIAL CELLS PRESENT RARE GRAM POSITIVE COCCI IN PAIRS RARE GRAM NEGATIVE COCCI RARE GRAM NEGATIVE RODS    Culture   Final    NORMAL OROPHARYNGEAL FLORA Performed at [facility]    Report Status 04/03/2015 FINAL  Final  MRSA PCR Screening     Status: None   Collection Time: 04/02/15 10:12 AM  Result Value Ref Range Status   MRSA by PCR NEGATIVE NEGATIVE Final    Comment:        The GeneXpert MRSA Assay (FDA approved for NASAL specimens only), is one component of a comprehensive MRSA colonization surveillance program. It is not intended to diagnose MRSA infection nor to guide or monitor treatment for MRSA infections.     Anti-infectives    Start     Dose/Rate Route Frequency Ordered Stop   04/03/15 1000  vancomycin (VANCOCIN) IVPB 750 mg/150 ml premix     750 mg 150 mL/hr over 60 Minutes Intravenous Every 12 hours 04/03/15 0932     04/02/15 1000  piperacillin-tazobactam (ZOSYN) IVPB 3.375 g     3.375 g 12.5 mL/hr over 240 Minutes Intravenous Every 8 hours 04/02/15 0954     04/02/15 1000  vancomycin (VANCOCIN) 1,250 mg in sodium chloride 0.9 % 250 mL IVPB  Status:  Discontinued     1,250 mg 166.7 mL/hr over 90 Minutes Intravenous Every 24 hours 04/02/15 0955 04/03/15 0932   04/01/15 0800  vancomycin (VANCOCIN) 1,250 mg in sodium chloride 0.9 % 250 mL IVPB  Status:  Discontinued     1,250 mg 166.7 mL/hr over 90 Minutes Intravenous Every 24 hours 03/31/15 1647 04/01/15 0946   03/31/15 1800  cefTAZidime (FORTAZ) 2 g in dextrose 5 % 50 mL IVPB  Status:  Discontinued     2 g 100 mL/hr over 30 Minutes Intravenous Every 12 hours 03/31/15 1648 04/01/15 0953   03/31/15 1645  cefTAZidime (FORTAZ) 2 g in dextrose  5 % 50 mL IVPB  Status:  Discontinued     2 g 100 mL/hr over 30 Minutes Intravenous 3 times per day 03/31/15 1642 03/31/15 1648   03/31/15 1100  piperacillin-tazobactam (ZOSYN) IVPB 3.375 g     3.375 g 100 mL/hr over 30 Minutes Intravenous  Once 03/31/15 1058 03/31/15 1215   03/31/15 1100  vancomycin (VANCOCIN) IVPB 1000 mg/200 mL premix     1,000 mg 200 mL/hr over 60 Minutes Intravenous  Once 03/31/15 1058 03/31/15 1253      Assessment: 81 yr old  male to be admitted with recurrent aspiration pneumonia. Was just in the hospital 8/20-23/2016. Received Zosyn 8/20>>8/23, transitioned to Augmentin on 8/23 and transferred to floor. Went back to the  ICU with worsening respiratory status and concern for repeat aspiration. Now day # 6 of abx. VT on the 3rd was therapeutic. Afebrile, WBC wnl, All cx neg so far. Scr down to 1.26, CrCl~50-60 ml/min.   Goal of Therapy:  Vancomycin trough level 15-20 mcg/ml  Resolution of infection  Plan:  Continue Zosyn 3.375g IV Q8h (4 hr inf) Continue vancomycin 750 mg IV Q12h  Monitor clinical picture, renal function, VT prn F/U LOT (Plan for 7 day course)  Cris Gibby J 04/07/2015,3:17 PM

## 2015-04-07 NOTE — Progress Notes (Signed)
Physical Therapy Treatment [name] MRN: [id number] DOB: Dec 06, 1933 Today's Date: 04/07/2015    History of Present Illness Antonio Ray is a 81 y.o. male with a past medical history of chronic systolic congestive heart failure having ejection fraction 35-40%, chronic kidney disease, established history of coronary artery disease; who was recently admitted and discharge on 8/23 due to aspiration PNA and achalasia; who returned today to ED secondary to increasing cough and shortness of breath. Patient has RLL PNA on CXR and also WBc's 18.1, temp 101.2, HR 124, RR 39; lactic acid 2.4. Which appears to be secondary to aspiration again. Patient would be admitted due to sepsis 2/2 PNA (HCAP vs aspiration). Pt with code blue for respiratory distress this admission and intubated on 04-02-15 for one day. Extubated 04-03-15.    PT Comments    Patient seated in recliner and agreeable to participate in PT today. Patient was able to ambulate and participate in exercise as described below. Patient frustrated by lack up ability to get up on his own due to reports of feeling weaker since he has been in the hospital, and he was thoroughly educated on importance of doing exercises throughout the day to maintain strength. See Vitals below to see pertinent vitals during session. PA entered room briefly to describe medical procedure to patient and listen to his breathe sounds. Patient will benefit from continued PT to help maintain strength and function while in the hospital.   Follow Up Recommendations  Home health PT;Supervision/Assistance - 24 hour     Equipment Recommendations  3in1 (PT);Cane (Due to multiple refusals of RW use.)    Recommendations for Other Services       Precautions / Restrictions Precautions Precautions: Fall Precaution Comments: desats on room Air Restrictions Weight Bearing Restrictions: No    Mobility  Bed Mobility               General bed  mobility comments: In chair upon PT arrival.  Transfers Overall transfer level: Modified independent Equipment used: None (Refused use of RW multiple times.) Transfers: Sit to/from Stand Sit to Stand: Min guard            Ambulation/Gait Ambulation/Gait assistance: Min guard Ambulation Distance (Feet): 80 Feet Assistive device: None (Pt refused use of RW multiple times, pushed IV pole.) Gait Pattern/deviations: Decreased stride length;Step-through pattern   Gait velocity interpretation: Below normal speed for age/gender General Gait Details: Patient denied SOB throughout, took one standing rest break halfway through walk. Refused RW multiple times but maintained firm grip on IV pole. Required max VC's to breathe through nose.   Stairs            Wheelchair Mobility    Modified Rankin (Stroke Patients Only)       Balance                                    Cognition Arousal/Alertness: Awake/alert Behavior During Therapy: WFL for tasks assessed/performed Overall Cognitive Status: Within Functional Limits for tasks assessed                      Exercises General Exercises - Lower Extremity Long Arc Quad: AROM;Both;20 reps;Seated Hip Flexion/Marching: AROM;Both;20 reps;Seated    General Comments        Pertinent Vitals/Pain Pain Assessment: No/denies pain  Sats on RA, sitting = 86% Sats on 2L O2 [place],  sitting = 94-95% Sats while ambulating 2L O2 = 80-88% Sats while ambulating 3L O2 = 88% Sats after ambulating on 2L O2 = 96-97%    Home Living                      Prior Function            PT Goals (current goals can now be found in the care plan section) Acute Rehab PT Goals Patient Stated Goal: Go home following procedure PT Goal Formulation: With patient Time For Goal Achievement: 04/19/15 Potential to Achieve Goals: Good Progress towards PT goals: Progressing toward goals    Frequency  Min 3X/week    PT Plan  Current plan remains appropriate    Co-evaluation             End of Session Equipment Utilized During Treatment: Gait belt;Oxygen Activity Tolerance: Patient limited by fatigue Patient left: in chair;with call bell/phone within reach;with chair alarm set     Time: [phone] PT Time Calculation (min) (ACUTE ONLY): 28 min  Charges:  $Gait Training: 8-22 mins $Therapeutic Exercise: 8-22 mins                    G CodesRoanna Epley, SPT [phone] 04/07/2015, 11:27 AM  I have read, reviewed and agree with student's note.   [place] [phone] (pager)

## 2015-04-07 NOTE — Care Management Important Message (Signed)
Important Message  Patient Details  Name: Antonio Ray MRN: [id number] Date of Birth: 1934-05-13   Medicare Important Message Given:  Yes-second notification given    Nathen May 04/07/2015, 11:40 AMImportant Message  Patient Details  Name: Antonio Ray MRN: [id number] Date of Birth: January 06, 1934   Medicare Important Message Given:  Yes-second notification given    Nathen May 04/07/2015, 11:40 AM

## 2015-04-07 NOTE — Progress Notes (Signed)
Peripherally Inserted Central Catheter/Midline Placement  The IV Nurse has discussed with the patient and/or persons authorized to consent for the patient, the purpose of this procedure and the potential benefits and risks involved with this procedure.  The benefits include less needle sticks, lab draws from the catheter and patient may be discharged home with the catheter.  Risks include, but not limited to, infection, bleeding, blood clot (thrombus formation), and puncture of an artery; nerve damage and irregular heat beat.  Alternatives to this procedure were also discussed.  PICC/Midline Placement Documentation  PICC / Midline Double Lumen 04/07/15 PICC Right Cephalic 41 cm 0 cm (Active)  Indication for Insertion or Continuance of Line Prolonged intravenous therapies 04/07/2015  8:50 PM  Exposed Catheter (cm) 0 cm 04/07/2015  8:50 PM  Site Assessment Clean;Dry;Intact 04/07/2015  8:50 PM  Lumen #1 Status Blood return noted;Flushed 04/07/2015  8:50 PM  Lumen #2 Status Blood return noted;Flushed 04/07/2015  8:50 PM  Dressing Type Transparent 04/07/2015  8:50 PM  Dressing Status Clean;Dry;Intact;Antimicrobial disc in place 04/07/2015  8:50 PM  Dressing Intervention New dressing 04/07/2015  8:50 PM  Dressing Change Due 04/14/15 04/07/2015  8:50 PM       Aldona Lento L 04/07/2015, 9:02 PM

## 2015-04-07 NOTE — Progress Notes (Signed)
[place] TEAM 1 - Stepdown/ICU TEAM Progress Note  LAMONTAE RICARDO YYT:[id number] DOB: June 08, 1934 DOA: 03/31/2015 PCP: [name], MD  Admit HPI / Brief Narrative: 81 y.o. WM PMHx Memory Loss, Chronic Systolic CHF having ejection fraction 35-40%, CAD native artery, PVCs, Anterior MI, S/P AICD, COPD, HLD CKD stage III, DM Type 2   Was discharge on 8/23 after tx for aspiration PNA and achalasia only to return to the ED 8/30 secondary to increasing cough and shortness of breath. In the ED the patient had RLL infiltrate on CXR and WBc's 18.1, temp 101.2, HR 124, RR 39; lactic acid 2.4. He was admitted the the hospital, was being tx for possible HCAP v/s probable simple aspiration pneumonitis, and had stabilized nicely. He was tx from SDU to floor, and on 9/1 pts son found him lethargic w/ agonal respirations. The son bagged him, a code blue was called, and he was subsequently intubated by anesthesia. PCCM assumed his care upon his arrival to the MICU.  In the ICU, the pt self-extubated 9/2>9/3 night/early morning. He remained stable off the vent, and was not re-intubated. He was transferred to the SDU on 9/3, and TRH resumed his care on 9/4.   HPI/Subjective: 9/6 A/O 4, Acute Respiratory distress. Patient states feels better than when admitted. However still positive SOB, negative CP. Patient with continued hoarseness   Assessment/Plan: Acute hypoxic respiratory arrest 9/1 -Pt found apneic in his hospital room by his son, who himself initiated Code Blue procedure,appears to have been a pure respiratory event - intubated 9/1 and self extubated 9/3 -Still requiring 2 L O2 via [place] at [place] to maintain SPO2 mid 90s   Sepsis due to RLL aspiration PNA/pneumonitis -Sepsis physiology has resolved  -Continue current antibiotics regimen. Given patient's comorbidities and his upcoming surgical intervention (Botox injection) would complete a ten-day course. -PCCM note from 9/5 also favors continued  antibiotics -Continue Solu-Medrol 40 mg BID -Continue DuoNeb QID -Unable to obtain peripheral IV access; CVL has been in for 7 days must be removed. Have requested PICC line placement   COPD w/ acute bronchospastic exacerbation  -See sepsis due to RLL aspiration    Achalasia -GI plans for EGD/Botox injection in the a.m. if patient remains stable   Ischemic cardiomyopathy/Acute on Chronic systolic congestive heart failure/AICD -Previous EF 30-35% June 2016  - TTE this admit notes decline in EF to 10-15%  - recent discharge weight of 83 kg > current weight 92 kg - follow -strict -strict I&O; + 940ml  - Cardiology following  - continue diuresis Lasix 40 mg daily  - reportedly unable to tolerate BB due to orthostasis  Mildly elevated troponin -Cardiology does not feel this is indicative of ACS   CAD native artery -PCI to LAD 2006  - follow-up cardiac cath 2007 with no intervention required  DM Type 2 Uncontrolled  -8/21 hemoglobin A1c = 8.5  -Continue Lantus 12 units daily -Increase to moderate SSI  CKD stage III(Baseline Cr~1.6-1.9)  - creatinine presently better than baseline   Code Status: FULL Family Communication: no family present at time of exam Disposition Plan: CIR vs SNF    Consultants: [facility] Dr.James Allred (EP) Dr. Nadean Corwin [facility]  Dr.Clinton D Young PCCM   Procedure/Significant Events: 9/1 echocardiogram;Left ventricle:  mild LVH. -LVEF= 10% -15%. Diffuse hypokinesis. Akinesis of anteroseptal myocardium.    Culture 8/30 blood left arm negative 8/30 urine negative 8/30 and 9/1 MRSA by PCR negative 8/31 sputum negative   Antibiotics: Zosyn 8/30 >  Vancomycin 8/30 >    DVT prophylaxis: Subcutaneous heparin    Devices    LINES / TUBES:      Continuous Infusions: . sodium chloride Stopped (04/07/15 1657)  . sodium chloride 20 mL/hr at 04/07/15 1657    Objective: VITAL SIGNS: Temp: 98.3 F (36.8 C) (09/06  1600) Temp Source: Oral (09/06 1600) BP: 136/79 mmHg (09/06 1600) Pulse Rate: 71 (09/06 1600) SPO2; FIO2:   Intake/Output Summary (Last 24 hours) at 04/07/15 1900 Last data filed at 04/07/15 1600  Gross per 24 hour  Intake   1950 ml  Output   4400 ml  Net  -2450 ml     Exam: General: A/O 4, positive Acute respiratory distress (but improved per patient from admission) Eyes: Negative headache,scleral hemorrhage ENT: Negative Runny nose, negative gingival bleeding, Neck:  Negative scars, masses, torticollis, lymphadenopathy, JVD Lungs: diffuse poor air movement, without wheezes or crackles Cardiovascular: Regular rate and rhythm without murmur gallop or rub normal S1 and S2 Abdomen:negative abdominal pain, negative dysphagia, nondistended, positive soft, bowel sounds, no rebound, no ascites, no appreciable mass Extremities: No significant cyanosis, clubbing, or edema bilateral lower extremities Psychiatric:  Negative depression, negative anxiety, negative fatigue, negative mania  Neurologic:  Cranial nerves II through XII intact, tongue/uvula midline, all extremities muscle strength 5/5, sensation intact throughout,  negative dysarthria, negative expressive aphasia, negative receptive aphasia.   Data Reviewed: Basic Metabolic Panel:  Recent Labs Lab 04/02/15 1015 04/03/15 0130 04/03/15 0143 04/04/15 0504 04/05/15 0458 04/06/15 0530  NA 140 140  --  140 143 142  K 5.9* 3.6  --  4.0 3.6 4.4  CL 109 108  --  107 106 103  CO2 23 22  --  28 31 34*  GLUCOSE 206* 203*  --  151* 119* 195*  BUN 23* 23*  --  23* 29* 28*  CREATININE 1.45* 1.43*  --  1.27* 1.32* 1.26*  CALCIUM 8.2* 8.2*  --  7.8* 8.0* 8.0*  MG  --   --  2.0 2.2 2.4  --   PHOS  --  1.4*  --  3.0 2.6  --    Liver Function Tests:  Recent Labs Lab 03/31/15 1937 04/02/15 1015 04/03/15 0130  AST 27 36  --   ALT 20 24  --   ALKPHOS 39 57  --   BILITOT 0.7 0.7  --   PROT 6.1* 6.2*  --   ALBUMIN 2.8* 2.8*  2.3*   No results for input(s): LIPASE, AMYLASE in the last 168 hours. No results for input(s): AMMONIA in the last 168 hours. CBC:  Recent Labs Lab 03/31/15 1937  04/02/15 1015 04/03/15 0143 04/04/15 0504 04/05/15 0458 04/06/15 0530  WBC 25.7*  < > 18.6* 13.2* 17.8* 12.1* 9.4  NEUTROABS 22.3*  --  16.1* 12.6*  --   --   --   HGB 11.5*  < > 13.5 11.3* 11.5* 11.2* 11.3*  HCT 38.1*  < > 45.4 37.1* 37.4* 37.0* 37.3*  MCV 87.8  < > 91.3 87.1 87.6 87.3 87.4  PLT 222  < > 226 182 173 193 198  < > = values in this interval not displayed. Cardiac Enzymes:  Recent Labs Lab 04/03/15 0630 04/03/15 1245 04/03/15 1916 04/04/15 0100 04/04/15 0754  TROPONINI 0.90* 0.70* 0.54* 0.49* 0.39*   BNP (last 3 results)  Recent Labs  02/07/15 1300 03/21/15 1055 04/02/15 1015  BNP 131.2* 100.9* 906.6*    ProBNP (last 3 results) No results for  input(s): PROBNP in the last 8760 hours.  CBG:  Recent Labs Lab 04/06/15 1716 04/06/15 2124 04/07/15 0754 04/07/15 1232 04/07/15 1555  GLUCAP 204* 220* 162* 211* 161*    Recent Results (from the past 240 hour(s))  Blood Culture (routine x 2)     Status: None   Collection Time: 03/31/15 11:00 AM  Result Value Ref Range Status   Specimen Description BLOOD LEFT ANTECUBITAL  Final   Special Requests BOTTLES DRAWN AEROBIC AND ANAEROBIC 5ML  Final   Culture NO GROWTH 5 DAYS  Final   Report Status 04/05/2015 FINAL  Final  Blood Culture (routine x 2)     Status: None   Collection Time: 03/31/15 11:17 AM  Result Value Ref Range Status   Specimen Description BLOOD LEFT ARM  Final   Special Requests BOTTLES DRAWN AEROBIC AND ANAEROBIC 5ML  Final   Culture NO GROWTH 5 DAYS  Final   Report Status 04/05/2015 FINAL  Final  Urine culture     Status: None   Collection Time: 03/31/15 11:40 AM  Result Value Ref Range Status   Specimen Description URINE, CLEAN CATCH  Final   Special Requests NONE  Final   Culture NO GROWTH 1 DAY  Final   Report  Status 04/01/2015 FINAL  Final  MRSA PCR Screening     Status: None   Collection Time: 03/31/15  5:09 PM  Result Value Ref Range Status   MRSA by PCR NEGATIVE NEGATIVE Final    Comment:        The GeneXpert MRSA Assay (FDA approved for NASAL specimens only), is one component of a comprehensive MRSA colonization surveillance program. It is not intended to diagnose MRSA infection nor to guide or monitor treatment for MRSA infections.   Culture, sputum-assessment     Status: None   Collection Time: 04/01/15  6:15 AM  Result Value Ref Range Status   Specimen Description SPUTUM  Final   Special Requests NONE  Final   Sputum evaluation   Final    THIS SPECIMEN IS ACCEPTABLE. RESPIRATORY CULTURE REPORT TO FOLLOW.   Report Status 04/01/2015 FINAL  Final  Culture, respiratory (NON-Expectorated)     Status: None   Collection Time: 04/01/15  6:15 AM  Result Value Ref Range Status   Specimen Description EXPECTORATED SPUTUM  Final   Special Requests NONE  Final   Gram Stain   Final    FEW WBC PRESENT, PREDOMINANTLY PMN RARE SQUAMOUS EPITHELIAL CELLS PRESENT RARE GRAM POSITIVE COCCI IN PAIRS RARE GRAM NEGATIVE COCCI RARE GRAM NEGATIVE RODS    Culture   Final    NORMAL OROPHARYNGEAL FLORA Performed at [facility]    Report Status 04/03/2015 FINAL  Final  MRSA PCR Screening     Status: None   Collection Time: 04/02/15 10:12 AM  Result Value Ref Range Status   MRSA by PCR NEGATIVE NEGATIVE Final    Comment:        The GeneXpert MRSA Assay (FDA approved for NASAL specimens only), is one component of a comprehensive MRSA colonization surveillance program. It is not intended to diagnose MRSA infection nor to guide or monitor treatment for MRSA infections.      Studies:  Recent x-ray studies have been reviewed in detail by the Attending Physician  Scheduled Meds:  Scheduled Meds: . antiseptic oral rinse  7 mL Mouth Rinse QID  . aspirin  81 mg Oral Daily  .  chlorhexidine gluconate  15 mL Mouth Rinse BID  .  donepezil  10 mg Oral q morning - 10a  . furosemide  40 mg Oral Daily  . heparin  5,000 Units Subcutaneous 3 times per day  . insulin aspart  0-15 Units Subcutaneous 6 times per day  . insulin glargine  12 Units Subcutaneous q morning - 10a  . ipratropium-albuterol  3 mL Nebulization QID  . losartan  25 mg Oral Daily  . methylPREDNISolone (SOLU-MEDROL) injection  40 mg Intravenous Q12H  . pantoprazole  40 mg Oral Daily  . piperacillin-tazobactam (ZOSYN)  IV  3.375 g Intravenous Q8H  . pravastatin  40 mg Oral QPM  . vancomycin  750 mg Intravenous Q12H    Time spent on care of this patient: 40 mins   WOODS, Geraldo Docker , MD  [facility] Office  [phone] Pager - [phone]  On-Call/Text Page:      [email or web address]      password [email or web address]  If 7PM-7AM, please contact night-coverage [email or web address] Password [email or web address] 04/07/2015, 7:00 PM   LOS: 7 days   Care during the described time interval was provided by me .  I have reviewed this patient's available data, including medical history, events of note, physical examination, and all test results as part of my evaluation. I have personally reviewed and interpreted all radiology studies.   Dia Crawford, MD [phone] Pager

## 2015-04-07 NOTE — Progress Notes (Signed)
Daily Rounding Note  04/07/2015, 8:53 AM  LOS: 7 days   SUBJECTIVE:       Tolerating full liquids.  Feels lots better.  SOB with activity, not at rest  OBJECTIVE:         Vital signs in last 24 hours:    Temp:  [97.8 F (36.6 C)-98.1 F (36.7 C)] 97.8 F (36.6 C) (09/06 0744) Pulse Rate:  [38-92] 73 (09/06 0800) Resp:  [21-36] 23 (09/06 0800) BP: (97-162)/(53-84) 129/71 mmHg (09/06 0800) SpO2:  [91 %-99 %] 98 % (09/06 0800) Weight:  [192 lb 6.4 oz (87.272 kg)-195 lb 14.4 oz (88.86 kg)] 192 lb 6.4 oz (87.272 kg) (09/06 0500) Last BM Date: 04/06/15 Filed Weights   04/05/15 0500 04/06/15 1619 04/07/15 0500  Weight: 202 lb 13.2 oz (92 kg) 195 lb 14.4 oz (88.86 kg) 192 lb 6.4 oz (87.272 kg)   General: looks ill.  Alert, pleasant   Heart: RRR.  No MRG Chest:  Greatly reduced bil without adventitious sounds.  Some dyspnea with speaking Abdomen: soft, active BS.  NT, ND  Extremities: no CCE Neuro/Psych:  Pleasant, appropriate, calm.   Intake/Output from previous day: 09/05 0701 - 09/06 0700 In: 2280 [P.O.:1520; I.V.:310; IV Piggyback:300] Out: 3775 [Urine:3775]  Intake/Output this shift: Total I/O In: 10 [I.V.:10] Out: -   Lab Results:  Recent Labs  04/05/15 0458 04/06/15 0530  WBC 12.1* 9.4  HGB 11.2* 11.3*  HCT 37.0* 37.3*  PLT 193 198   BMET  Recent Labs  04/05/15 0458 04/06/15 0530  NA 143 142  K 3.6 4.4  CL 106 103  CO2 31 34*  GLUCOSE 119* 195*  BUN 29* 28*  CREATININE 1.32* 1.26*  CALCIUM 8.0* 8.0*   PT/INR No results for input(s): LABPROT, INR in the last 72 hours.  Studies/Results: Dg Chest Port 1 View  04/06/2015   CLINICAL DATA:  Congestive heart failure.  Shortness of breath.  EXAM: PORTABLE CHEST - 1 VIEW  COMPARISON:  04/04/2015.  FINDINGS: Stable enlarged cardiac silhouette, left subclavian pacemaker leads and left jugular catheter. No significant change in bilateral lower lung  zone airspace opacity and prominence of the pulmonary vasculature. A small left pleural effusion is noted. Lower thoracic spine degenerative changes.  IMPRESSION: 1. Stable cardiomegaly and pulmonary vascular congestion. 2. Stable alveolar edema or pneumonia in both lower lung zones. 3. Small left pleural effusion.   Electronically Signed   By: Claudie Revering M.D.   On: 04/06/2015 07:39   Scheduled Meds: . antiseptic oral rinse  7 mL Mouth Rinse QID  . aspirin  81 mg Oral Daily  . chlorhexidine gluconate  15 mL Mouth Rinse BID  . donepezil  10 mg Oral q morning - 10a  . furosemide  40 mg Oral Daily  . heparin  5,000 Units Subcutaneous 3 times per day  . insulin aspart  0-9 Units Subcutaneous TID WC  . insulin glargine  12 Units Subcutaneous q morning - 10a  . ipratropium-albuterol  3 mL Nebulization QID  . losartan  25 mg Oral Daily  . methylPREDNISolone (SOLU-MEDROL) injection  40 mg Intravenous Q12H  . pantoprazole  40 mg Oral Daily  . piperacillin-tazobactam (ZOSYN)  IV  3.375 g Intravenous Q8H  . pravastatin  40 mg Oral QPM  . vancomycin  750 mg Intravenous Q12H   Continuous Infusions: . sodium chloride 10 mL/hr (04/05/15 1600)   PRN Meds:.acetaminophen (TYLENOL) oral liquid 160 mg/5  mL, fentaNYL (SUBLIMAZE) injection   ASSESMENT:   *  Achalasia.  On full liquid diet.   *  Recurrences of asp PNA.  Intubated last week, self exutbated. Likely aspiration pneumonitis.  On Zosyn, Solumedrol, Vanc.  Normalized WBCs, no fever.   *  Acute on chronic CHF.  Last week EF noted further reduced to 10 to 15%.  *  CKD.   AKI improved.    PLAN   *  EGD with Botox injection.  Will get slot for tomorrow with caveat that he is stable.    Azucena Freed  04/07/2015, 8:53 AM Pager: [phone]   ________________________________________________________________________  [facility] MD note:  I personally examined the patient, reviewed the data and agree with the assessment and plan described  above.  Planning for EGD with botox injection of GE junction tomorrow, currently scheduled for 10:45.     Owens Loffler, MD [facility] Pager [phone]

## 2015-04-08 ENCOUNTER — Encounter (HOSPITAL_COMMUNITY): Payer: Self-pay | Admitting: Gastroenterology

## 2015-04-08 ENCOUNTER — Inpatient Hospital Stay (HOSPITAL_COMMUNITY): Payer: Medicare Other | Admitting: Anesthesiology

## 2015-04-08 ENCOUNTER — Encounter (HOSPITAL_COMMUNITY)
Admission: EM | Disposition: A | Discharge status: Home Health | Payer: Self-pay | Source: Home / Self Care | Attending: Internal Medicine

## 2015-04-08 ENCOUNTER — Encounter: Payer: Self-pay | Admitting: Internal Medicine

## 2015-04-08 DIAGNOSIS — I5043 Acute on chronic combined systolic (congestive) and diastolic (congestive) heart failure: Secondary | ICD-10-CM

## 2015-04-08 DIAGNOSIS — N183 Chronic kidney disease, stage 3 (moderate): Secondary | ICD-10-CM

## 2015-04-08 HISTORY — PX: BOTOX INJECTION: SHX5754

## 2015-04-08 HISTORY — PX: ESOPHAGOGASTRODUODENOSCOPY: SHX5428

## 2015-04-08 LAB — CBC WITH DIFFERENTIAL/PLATELET
BASOS ABS: 0 10*3/uL (ref 0.0–0.1)
BASOS PCT: 0 % (ref 0–1)
EOS ABS: 0 10*3/uL (ref 0.0–0.7)
Eosinophils Relative: 0 % (ref 0–5)
HCT: 40.7 % (ref 39.0–52.0)
HEMOGLOBIN: 12.3 g/dL — AB (ref 13.0–17.0)
LYMPHS ABS: 0.6 10*3/uL — AB (ref 0.7–4.0)
Lymphocytes Relative: 8 % — ABNORMAL LOW (ref 12–46)
MCH: 26.3 pg (ref 26.0–34.0)
MCHC: 30.2 g/dL (ref 30.0–36.0)
MCV: 87.2 fL (ref 78.0–100.0)
Monocytes Absolute: 0.3 10*3/uL (ref 0.1–1.0)
Monocytes Relative: 4 % (ref 3–12)
NEUTROS PCT: 88 % — AB (ref 43–77)
Neutro Abs: 6.9 10*3/uL (ref 1.7–7.7)
Platelets: 205 10*3/uL (ref 150–400)
RBC: 4.67 MIL/uL (ref 4.22–5.81)
RDW: 14.3 % (ref 11.5–15.5)
WBC: 7.9 10*3/uL (ref 4.0–10.5)

## 2015-04-08 LAB — BASIC METABOLIC PANEL
ANION GAP: 6 (ref 5–15)
BUN: 27 mg/dL — ABNORMAL HIGH (ref 6–20)
CALCIUM: 8.8 mg/dL — AB (ref 8.9–10.3)
CO2: 38 mmol/L — AB (ref 22–32)
Chloride: 96 mmol/L — ABNORMAL LOW (ref 101–111)
Creatinine, Ser: 1.43 mg/dL — ABNORMAL HIGH (ref 0.61–1.24)
GFR calc non Af Amer: 45 mL/min — ABNORMAL LOW (ref >60)
GFR, EST AFRICAN AMERICAN: 52 mL/min — AB (ref >60)
Glucose, Bld: 179 mg/dL — ABNORMAL HIGH (ref 65–99)
Potassium: 4.4 mmol/L (ref 3.5–5.1)
Sodium: 140 mmol/L (ref 135–145)

## 2015-04-08 LAB — MAGNESIUM: MAGNESIUM: 2.2 mg/dL (ref 1.7–2.4)

## 2015-04-08 LAB — GLUCOSE, CAPILLARY
GLUCOSE-CAPILLARY: 164 mg/dL — AB (ref 65–99)
GLUCOSE-CAPILLARY: 131 mg/dL — AB (ref 65–99)
Glucose-Capillary: 103 mg/dL — ABNORMAL HIGH (ref 65–99)
Glucose-Capillary: 130 mg/dL — ABNORMAL HIGH (ref 65–99)
Glucose-Capillary: 138 mg/dL — ABNORMAL HIGH (ref 65–99)
Glucose-Capillary: 150 mg/dL — ABNORMAL HIGH (ref 65–99)

## 2015-04-08 SURGERY — EGD (ESOPHAGOGASTRODUODENOSCOPY)
Anesthesia: Monitor Anesthesia Care

## 2015-04-08 MED ORDER — INSULIN ASPART 100 UNIT/ML ~~LOC~~ SOLN
0.0000 [IU] | Freq: Three times a day (TID) | SUBCUTANEOUS | Status: DC
  Administered 2015-04-08: 2 [IU] via SUBCUTANEOUS
  Administered 2015-04-09: 8 [IU] via SUBCUTANEOUS
  Administered 2015-04-09 – 2015-04-10 (×4): 5 [IU] via SUBCUTANEOUS
  Administered 2015-04-11: 8 [IU] via SUBCUTANEOUS
  Administered 2015-04-11: 2 [IU] via SUBCUTANEOUS

## 2015-04-08 MED ORDER — PROPOFOL 10 MG/ML IV BOLUS
INTRAVENOUS | Status: DC | PRN
  Administered 2015-04-08 (×3): 20 mg via INTRAVENOUS

## 2015-04-08 MED ORDER — SODIUM CHLORIDE 0.9 % IV SOLN
INTRAVENOUS | Status: DC
Start: 2015-04-08 — End: 2015-04-08

## 2015-04-08 MED ORDER — LACTATED RINGERS IV SOLN
INTRAVENOUS | Status: DC | PRN
  Administered 2015-04-08: 12:00:00 via INTRAVENOUS

## 2015-04-08 MED ORDER — PREDNISONE 20 MG PO TABS
20.0000 mg | ORAL_TABLET | Freq: Two times a day (BID) | ORAL | Status: DC
  Administered 2015-04-08 – 2015-04-09 (×3): 20 mg via ORAL
  Filled 2015-04-08 (×3): qty 1

## 2015-04-08 MED ORDER — ONABOTULINUMTOXINA 100 UNITS IJ SOLR
INTRAMUSCULAR | Status: DC | PRN
  Administered 2015-04-08: 100 [IU] via INTRAMUSCULAR

## 2015-04-08 MED ORDER — SODIUM CHLORIDE 0.9 % IJ SOLN
100.0000 [IU] | INTRAMUSCULAR | Status: DC
  Filled 2015-04-08: qty 100

## 2015-04-08 MED ORDER — SODIUM CHLORIDE 0.9 % IV SOLN: INTRAVENOUS | Status: DC

## 2015-04-08 MED ORDER — PROPOFOL INFUSION 10 MG/ML OPTIME
INTRAVENOUS | Status: DC | PRN
  Administered 2015-04-08: 10 ug/kg/min via INTRAVENOUS

## 2015-04-08 NOTE — Op Note (Signed)
[facility] [place], [id number]   ENDOSCOPY PROCEDURE REPORT  PATIENT: Antonio Ray, Antonio Ray  MR#: [id number] BIRTHDATE: [birth year] , 60  yrs. old GENDER: male ENDOSCOPIST: Milus Banister, MD PROCEDURE DATE:  04/08/2015 PROCEDURE:  EGD w/ directed submucosal injection(s), any substance  ASA CLASS:     Class IV INDICATIONS:  achalasia, dysphagia, recurrent aspiration pneumonia.  MEDICATIONS: Monitored anesthesia care TOPICAL ANESTHETIC: none  DESCRIPTION OF PROCEDURE: After the risks benefits and alternatives of the procedure were thoroughly explained, informed consent was obtained.  The Pentax Gastroscope [id number] endoscope was introduced through the mouth and advanced to the second portion of the duodenum , Without limitations.  The instrument was slowly withdrawn as the mucosa was fully examined.  The GE junction was smoothly narrowed.  The mucosa in this region was normal.  I was able to advance the adult gastroscope across the GE junction with only mild pressure.  The UGI tract was otherwise normal appearing.  Using a stand sclerotherapy needle, I injected Botulinum Toxin 1cm above the GE junction (25units each into four quadrants).  Retroflexed views revealed no abnormalities and Retroflexed views revealed .     The scope was then withdrawn from the patient and the procedure completed. COMPLICATIONS: There were no immediate complications.  ENDOSCOPIC IMPRESSION: The GE junction was smoothly narrowed.  The mucosa in this region was normal.  I was able to advance the adult gastroscope across the GE junction with only mild pressure.  The UGI tract was otherwise normal appearing.  Using a standard sclerotherapy needed, I injected Botulinum Toxin 1cm above the GE junction (25units each into four quadrants)  RECOMMENDATIONS: Observe for clinical response.  Will advance diet as tolerated. Should notice improvement in dysphagia within 1-2  days.   eSigned:  Milus Banister, MD 04/08/2015 12:21 PM   cc: Zenovia Jarred, MD

## 2015-04-08 NOTE — Anesthesia Preprocedure Evaluation (Addendum)
Anesthesia Evaluation  Patient identified by MRN, date of birth, ID band Patient awake    Reviewed: Allergy & Precautions, NPO status , Patient's Chart, lab work & pertinent test results  History of Anesthesia Complications Negative for: history of anesthetic complications  Airway Mallampati: II  TM Distance: >3 FB Neck ROM: Full    Dental  (+) Dental Advisory Given, Partial Lower, Partial Upper   Pulmonary shortness of breath, pneumonia, unresolved, COPD, former smoker,     + decreased breath sounds      Cardiovascular Exercise Tolerance: Poor (-) angina+ CAD, + Past MI, +CHF and + DOE  Normal cardiovascular exam+ Cardiac Defibrillator  Rhythm:Regular Rate:Normal     Neuro/Psych negative neurological ROS     GI/Hepatic negative GI ROS, Neg liver ROS, GERD  ,Esophageal dysmotility   Endo/Other  diabetes, Type 2, Insulin Dependent  Renal/GU Renal InsufficiencyRenal disease     Musculoskeletal negative musculoskeletal ROS (+)   Abdominal   Peds  Hematology negative hematology ROS (+) Blood dyscrasia, anemia ,   Anesthesia Other Findings Day of surgery medications reviewed with the patient.  Reproductive/Obstetrics                          Anesthesia Physical Anesthesia Plan  ASA: IV  Anesthesia Plan: MAC   Post-op Pain Management:    Induction: Intravenous  Airway Management Planned: Nasal Cannula  Additional Equipment:   Intra-op Plan:   Post-operative Plan:   Informed Consent: I have reviewed the patients History and Physical, chart, labs and discussed the procedure including the risks, benefits and alternatives for the proposed anesthesia with the patient or authorized representative who has indicated his/her understanding and acceptance.   Dental advisory given  Plan Discussed with: CRNA and Anesthesiologist  Anesthesia Plan Comments: (Discussed  risks/benefits/alternatives to MAC sedation including need for ventilatory support, hypotension, need for conversion to general anesthesia.  All patient questions answered.  Patient wished to proceed.)        Anesthesia Quick Evaluation

## 2015-04-08 NOTE — Progress Notes (Addendum)
Stable on current heart failure medications. Uptitrate losartan as bp will allow. Not on b-blocker due to orthostasis - noted a short run of SVT overnight - asymptomatic. Consider outpatient stress test. Follow-up with Dr. [place].  Cardiology will sign-off, call with questions.  Pixie Casino, MD, [facility] Attending Cardiologist [place]

## 2015-04-08 NOTE — Progress Notes (Addendum)
[place] TEAM 1 - Stepdown/ICU TEAM PROGRESS NOTE  Antonio Ray PFX:[id number] DOB: [birth year] DOA: 03/31/2015 PCP: [name], MD  Admit HPI / Brief Narrative: 81 y.o. male with a history of chronic systolic congestive heart failure having ejection fraction 35-40%, chronic kidney disease, and coronary artery disease who was discharge on 8/23 after tx for aspiration PNA and achalasia only to return to the ED 8/30 secondary to increasing cough and shortness of breath. In the ED the patient had RLL infiltrate on CXR and WBc's 18.1, temp 101.2, HR 124, RR 39; lactic acid 2.4. He was admitted the the hospital, was being tx for possible HCAP v/s probable simple aspiration pneumonitis, and had stabilized nicely.  He was tx from SDU to floor, and on 9/1 pts son found him lethargic w/ agonal respirations. The son bagged him, a code blue was called, and he was subsequently intubated by anesthesia.  PCCM assumed his care upon his arrival to the MICU.  In the ICU, the pt self-extubated 9/2>9/3 night/early morning.  He remained stable off the vent, and was not re-intubated.  He was transferred to the SDU on 9/3, and [facility] resumed his care on 9/4.    HPI/Subjective: The patient is seen in his room post his endoscopy.  He is resting comfortably and is alert and oriented.  He denies chest pain shortness breath fevers chills nausea or vomiting.  Assessment/Plan:  Acute hypoxic respiratory arrest 9/1 - resolved Pt found apneic in his hospital room by his son, who himself initiated Code Blue procedure - appears to have been a pure respiratory event - intubated 9/1 and self extubated 9/3 - follow in SDU tonight as pt underwent sedation today for endoscopy   Sepsis due to RLL aspiration PNA/pneumonitis Sepsis physiology has resolved - completed empiric antibiotic for a 7 day course - this likely represents pneumonitis rather than a true pneumonia - CXR in f/u has been stable   Achalasia S/p EGD/Botox today  - process delayed by his resp arrest 9/1 - diet advancement per GI orders  Acute on Chronic systolic congestive heart failure Previous EF 30-35% June 2016 - TTE this admit notes decline in EF to 10-15% - recent discharge weight of 83 kg > current weight 87.2 kg - net negative 2.4L - Cardiology singed off today - continue diuresis w/o change in dose today - reportedly unable to tolerate BB due to orthostasis - to f/u w/ Cards as outp   Mildly elevated troponin Cardiology does not feel this is indicative of ACS - to consider outpt stress testing   DM 2 CBG now much better controlled - no change in tx plan today  CAD PCI to LAD 2006 - follow-up cardiac cath 2007 with no intervention required  COPD w/ acute bronchospastic exacerbation  Steroids and nebs - no wheezing at the time of exam today - wean steroids again in AM and stop soon  CKD stage III Baseline creatinine appears to be approximately 1.6-1.9 - creatinine presently better than baseline  Code Status: FULL Family Communication: Spoke with daughter-in-law at bedside  Disposition Plan: SDU  Consultants: [facility]  PCCM  Procedures: EGD w/ botox 9/7  Antibiotics: Zosyn 8/30 > 9/7 Vancomycin 8/30 > 9/7  DVT prophylaxis: Subcutaneous heparin  Objective: Blood pressure 110/56, pulse 63, temperature 97.2 F (36.2 C), temperature source Oral, resp. rate 19, height 5\' 10"  (1.778 m), weight 87.272 kg (192 lb 6.4 oz), SpO2 98 %.   Intake/Output Summary (Last 24  hours) at 04/08/15 1516 Last data filed at 04/08/15 1255  Gross per 24 hour  Intake    851 ml  Output   4175 ml  Net  -3324 ml   Exam: General: No acute respiratory distress  Lungs: Mild bibasilar crackles with good air movement throughout other fields - no appreciable wheeze  Cardiovascular: Regular rate and rhythm without murmur Abdomen: Nontender, nondistended, soft, bowel sounds positive, no rebound, no ascites, no appreciable  mass Extremities: No significant cyanosis, or clubbing;  trace edema bilateral lower extremities  Data Reviewed: Basic Metabolic Panel:  Recent Labs Lab 04/03/15 0130 04/03/15 0143 04/04/15 0504 04/05/15 0458 04/06/15 0530 04/08/15 0450  NA 140  --  140 143 142 140  K 3.6  --  4.0 3.6 4.4 4.4  CL 108  --  107 106 103 96*  CO2 22  --  28 31 34* 38*  GLUCOSE 203*  --  151* 119* 195* 179*  BUN 23*  --  23* 29* 28* 27*  CREATININE 1.43*  --  1.27* 1.32* 1.26* 1.43*  CALCIUM 8.2*  --  7.8* 8.0* 8.0* 8.8*  MG  --  2.0 2.2 2.4  --  2.2  PHOS 1.4*  --  3.0 2.6  --   --     CBC:  Recent Labs Lab 04/02/15 1015 04/03/15 0143 04/04/15 0504 04/05/15 0458 04/06/15 0530 04/08/15 0450  WBC 18.6* 13.2* 17.8* 12.1* 9.4 7.9  NEUTROABS 16.1* 12.6*  --   --   --  6.9  HGB 13.5 11.3* 11.5* 11.2* 11.3* 12.3*  HCT 45.4 37.1* 37.4* 37.0* 37.3* 40.7  MCV 91.3 87.1 87.6 87.3 87.4 87.2  PLT 226 182 173 193 198 205    Liver Function Tests:  Recent Labs Lab 04/02/15 1015 04/03/15 0130  AST 36  --   ALT 24  --   ALKPHOS 57  --   BILITOT 0.7  --   PROT 6.2*  --   ALBUMIN 2.8* 2.3*   CBG:  Recent Labs Lab 04/07/15 1555 04/07/15 2101 04/08/15 0025 04/08/15 0513 04/08/15 0801  GLUCAP 161* 151* 150* 164* 130*    Recent Results (from the past 240 hour(s))  Blood Culture (routine x 2)     Status: None   Collection Time: 03/31/15 11:00 AM  Result Value Ref Range Status   Specimen Description BLOOD LEFT ANTECUBITAL  Final   Special Requests BOTTLES DRAWN AEROBIC AND ANAEROBIC 5ML  Final   Culture NO GROWTH 5 DAYS  Final   Report Status 04/05/2015 FINAL  Final  Blood Culture (routine x 2)     Status: None   Collection Time: 03/31/15 11:17 AM  Result Value Ref Range Status   Specimen Description BLOOD LEFT ARM  Final   Special Requests BOTTLES DRAWN AEROBIC AND ANAEROBIC 5ML  Final   Culture NO GROWTH 5 DAYS  Final   Report Status 04/05/2015 FINAL  Final  Urine culture      Status: None   Collection Time: 03/31/15 11:40 AM  Result Value Ref Range Status   Specimen Description URINE, CLEAN CATCH  Final   Special Requests NONE  Final   Culture NO GROWTH 1 DAY  Final   Report Status 04/01/2015 FINAL  Final  MRSA PCR Screening     Status: None   Collection Time: 03/31/15  5:09 PM  Result Value Ref Range Status   MRSA by PCR NEGATIVE NEGATIVE Final    Comment:  The GeneXpert MRSA Assay (FDA approved for NASAL specimens only), is one component of a comprehensive MRSA colonization surveillance program. It is not intended to diagnose MRSA infection nor to guide or monitor treatment for MRSA infections.   Culture, sputum-assessment     Status: None   Collection Time: 04/01/15  6:15 AM  Result Value Ref Range Status   Specimen Description SPUTUM  Final   Special Requests NONE  Final   Sputum evaluation   Final    THIS SPECIMEN IS ACCEPTABLE. RESPIRATORY CULTURE REPORT TO FOLLOW.   Report Status 04/01/2015 FINAL  Final  Culture, respiratory (NON-Expectorated)     Status: None   Collection Time: 04/01/15  6:15 AM  Result Value Ref Range Status   Specimen Description EXPECTORATED SPUTUM  Final   Special Requests NONE  Final   Gram Stain   Final    FEW WBC PRESENT, PREDOMINANTLY PMN RARE SQUAMOUS EPITHELIAL CELLS PRESENT RARE GRAM POSITIVE COCCI IN PAIRS RARE GRAM NEGATIVE COCCI RARE GRAM NEGATIVE RODS    Culture   Final    NORMAL OROPHARYNGEAL FLORA Performed at [facility]    Report Status 04/03/2015 FINAL  Final  MRSA PCR Screening     Status: None   Collection Time: 04/02/15 10:12 AM  Result Value Ref Range Status   MRSA by PCR NEGATIVE NEGATIVE Final    Comment:        The GeneXpert MRSA Assay (FDA approved for NASAL specimens only), is one component of a comprehensive MRSA colonization surveillance program. It is not intended to diagnose MRSA infection nor to guide or monitor treatment for MRSA infections.       Studies:   Recent x-ray studies have been reviewed in detail by the Attending Physician  Scheduled Meds:  Scheduled Meds: . antiseptic oral rinse  7 mL Mouth Rinse QID  . aspirin  81 mg Oral Daily  . botox 100 units/NS 4 mL for final conc. 25 units/mL mixture  100 Units Submucosal To Endo  . chlorhexidine gluconate  15 mL Mouth Rinse BID  . donepezil  10 mg Oral q morning - 10a  . furosemide  40 mg Oral Daily  . heparin  5,000 Units Subcutaneous 3 times per day  . insulin aspart  0-15 Units Subcutaneous 6 times per day  . insulin glargine  12 Units Subcutaneous q morning - 10a  . ipratropium-albuterol  3 mL Nebulization QID  . losartan  25 mg Oral Daily  . methylPREDNISolone (SOLU-MEDROL) injection  40 mg Intravenous Q12H  . pantoprazole  40 mg Oral Daily  . piperacillin-tazobactam (ZOSYN)  IV  3.375 g Intravenous Q8H  . pravastatin  40 mg Oral QPM  . sodium chloride  10-40 mL Intracatheter Q12H  . vancomycin  750 mg Intravenous Q12H    Time spent on care of this patient: 25 mins   Deuce Paternoster T , MD   [facility] Office  [phone] Pager - Text Page per Shea Evans as per below:  On-Call/Text Page:      [email or web address]      password [email or web address]  If 7PM-7AM, please contact night-coverage [email or web address] Password [email or web address] 04/08/2015, 3:16 PM   LOS: 8 days

## 2015-04-08 NOTE — Interval H&P Note (Signed)
History and Physical Interval Note:  04/08/2015 11:03 AM  Antonio Ray  has presented today for surgery, with the diagnosis of achalasia, aspiration, dysphagia  The various methods of treatment have been discussed with the patient and family. After consideration of risks, benefits and other options for treatment, the patient has consented to  Procedure(s): ESOPHAGOGASTRODUODENOSCOPY (EGD) (N/A) BOTOX INJECTION (N/A) as a surgical intervention .  The patient's history has been reviewed, patient examined, no change in status, stable for surgery.  I have reviewed the patient's chart and labs.  Questions were answered to the patient's satisfaction.     Milus Banister

## 2015-04-08 NOTE — Progress Notes (Signed)
Speech Language Pathology Treatment: Dysphagia  [name] MRN: [id number] DOB: [birth year] Today's Date: 04/08/2015 Time: 4481-8563 SLP Time Calculation (min) (ACUTE ONLY): 20 min  Assessment / Plan / Recommendation Clinical Impression  Skilled treatment session focused on addressing dysphagia.  Patient seen after procedure for Botox injection to address esophageal issues that impact swallow function.  Patient currently on Dys.3 textures and thin liquids with reports of globus sensation following lunch today.  Patient consumed thin liquids with no overt s/s of aspiration, Dys.1 and 2 textures with Min cues to alternate bites with sips without difficulty or reports of globus sensation; however, he did exhibit frequent belching.  Patient, daughter-in-law and RN educated on recommendations.      HPI Other Pertinent Information: Antonio Ray is a 81 y.o. male with a past medical history of chronic systolic congestive heart failure having ejection fraction 35-40%, chronic kidney disease, established history of coronary artery disease; who was recently admitted and discharge on 8/23 due to aspiration PNA and achalasia; who returned today to ED secondary to increasing cough and shortness of breath. Patient has RLL PNA on CXR and also WBc's 18.1, temp 101.2, HR 124, RR 39; lactic acid 2.4. Which appears to be secondary to aspiration again. Patient would be admitted due to sepsis 2/2 PNA (HCAP vs aspiration). Pt with code blue for respiratory distress this admission and intubated on 04-02-15 for one day. Extubated 04-03-15. Bedside swallow eval ordered post-self extubation and with recurring aspiration PNA to determine safest diet.   Pertinent Vitals Pain Assessment: No/denies pain  SLP Plan  Continue with current plan of care    Recommendations Diet recommendations: Dysphagia 2 (fine chop);Thin liquid Liquids provided via: Cup;Straw Medication Administration: Whole meds with  liquid Supervision: Patient able to self feed;Intermittent supervision to cue for compensatory strategies Compensations: Small sips/bites;Slow rate;Follow solids with liquid Postural Changes and/or Swallow Maneuvers: Out of bed for meals;Seated upright 90 degrees;Upright 30-60 min after meal              Oral Care Recommendations: Oral care BID Follow up Recommendations:  (TBD at next visit) Plan: Continue with current plan of care    GO    Antonio Ray., [place] 04/08/2015, 4:56 PM

## 2015-04-08 NOTE — H&P (View-Only) (Signed)
Daily Rounding Note  04/07/2015, 8:53 AM  LOS: 7 days   SUBJECTIVE:       Tolerating full liquids.  Feels lots better.  SOB with activity, not at rest  OBJECTIVE:         Vital signs in last 24 hours:    Temp:  [97.8 F (36.6 C)-98.1 F (36.7 C)] 97.8 F (36.6 C) (09/06 0744) Pulse Rate:  [38-92] 73 (09/06 0800) Resp:  [21-36] 23 (09/06 0800) BP: (97-162)/(53-84) 129/71 mmHg (09/06 0800) SpO2:  [91 %-99 %] 98 % (09/06 0800) Weight:  [192 lb 6.4 oz (87.272 kg)-195 lb 14.4 oz (88.86 kg)] 192 lb 6.4 oz (87.272 kg) (09/06 0500) Last BM Date: 04/06/15 Filed Weights   04/05/15 0500 04/06/15 1619 04/07/15 0500  Weight: 202 lb 13.2 oz (92 kg) 195 lb 14.4 oz (88.86 kg) 192 lb 6.4 oz (87.272 kg)   General: looks ill.  Alert, pleasant   Heart: RRR.  No MRG Chest:  Greatly reduced bil without adventitious sounds.  Some dyspnea with speaking Abdomen: soft, active BS.  NT, ND  Extremities: no CCE Neuro/Psych:  Pleasant, appropriate, calm.   Intake/Output from previous day: 09/05 0701 - 09/06 0700 In: 2280 [P.O.:1520; I.V.:310; IV Piggyback:300] Out: 3775 [Urine:3775]  Intake/Output this shift: Total I/O In: 10 [I.V.:10] Out: -   Lab Results:  Recent Labs  04/05/15 0458 04/06/15 0530  WBC 12.1* 9.4  HGB 11.2* 11.3*  HCT 37.0* 37.3*  PLT 193 198   BMET  Recent Labs  04/05/15 0458 04/06/15 0530  NA 143 142  K 3.6 4.4  CL 106 103  CO2 31 34*  GLUCOSE 119* 195*  BUN 29* 28*  CREATININE 1.32* 1.26*  CALCIUM 8.0* 8.0*   PT/INR No results for input(s): LABPROT, INR in the last 72 hours.  Studies/Results: Dg Chest Port 1 View  04/06/2015   CLINICAL DATA:  Congestive heart failure.  Shortness of breath.  EXAM: PORTABLE CHEST - 1 VIEW  COMPARISON:  04/04/2015.  FINDINGS: Stable enlarged cardiac silhouette, left subclavian pacemaker leads and left jugular catheter. No significant change in bilateral lower lung  zone airspace opacity and prominence of the pulmonary vasculature. A small left pleural effusion is noted. Lower thoracic spine degenerative changes.  IMPRESSION: 1. Stable cardiomegaly and pulmonary vascular congestion. 2. Stable alveolar edema or pneumonia in both lower lung zones. 3. Small left pleural effusion.   Electronically Signed   By: Claudie Revering M.D.   On: 04/06/2015 07:39   Scheduled Meds: . antiseptic oral rinse  7 mL Mouth Rinse QID  . aspirin  81 mg Oral Daily  . chlorhexidine gluconate  15 mL Mouth Rinse BID  . donepezil  10 mg Oral q morning - 10a  . furosemide  40 mg Oral Daily  . heparin  5,000 Units Subcutaneous 3 times per day  . insulin aspart  0-9 Units Subcutaneous TID WC  . insulin glargine  12 Units Subcutaneous q morning - 10a  . ipratropium-albuterol  3 mL Nebulization QID  . losartan  25 mg Oral Daily  . methylPREDNISolone (SOLU-MEDROL) injection  40 mg Intravenous Q12H  . pantoprazole  40 mg Oral Daily  . piperacillin-tazobactam (ZOSYN)  IV  3.375 g Intravenous Q8H  . pravastatin  40 mg Oral QPM  . vancomycin  750 mg Intravenous Q12H   Continuous Infusions: . sodium chloride 10 mL/hr (04/05/15 1600)   PRN Meds:.acetaminophen (TYLENOL) oral liquid 160 mg/5  mL, fentaNYL (SUBLIMAZE) injection   ASSESMENT:   *  Achalasia.  On full liquid diet.   *  Recurrences of asp PNA.  Intubated last week, self exutbated. Likely aspiration pneumonitis.  On Zosyn, Solumedrol, Vanc.  Normalized WBCs, no fever.   *  Acute on chronic CHF.  Last week EF noted further reduced to 10 to 15%.  *  CKD.   AKI improved.    PLAN   *  EGD with Botox injection.  Will get slot for tomorrow with caveat that he is stable.    Azucena Freed  04/07/2015, 8:53 AM Pager: [phone]   ________________________________________________________________________  Velora Heckler GI MD note:  I personally examined the patient, reviewed the data and agree with the assessment and plan described  above.  Planning for EGD with botox injection of GE junction tomorrow, currently scheduled for 10:45.     Owens Loffler, MD [facility] Pager [phone]

## 2015-04-08 NOTE — Transfer of Care (Signed)
Immediate Anesthesia Transfer of Care Note  Patient: Antonio Ray  Procedure(s) Performed: Procedure(s): ESOPHAGOGASTRODUODENOSCOPY (EGD) (N/A) BOTOX INJECTION (N/A)  Patient Location: PACU and Endoscopy Unit  Anesthesia Type:MAC  Level of Consciousness: awake, alert , oriented and sedated  Airway & Oxygen Therapy: Patient Spontanous Breathing and Patient connected to nasal cannula oxygen  Post-op Assessment: Report given to RN, Post -op Vital signs reviewed and stable and Patient moving all extremities  Post vital signs: Reviewed and stable  Last Vitals:  Filed Vitals:   04/08/15 1019  BP: 151/91  Pulse:   Temp: 36.6 C  Resp: 17    Complications: No apparent anesthesia complications

## 2015-04-09 ENCOUNTER — Ambulatory Visit (HOSPITAL_COMMUNITY): Admission: RE | Admit: 2015-04-09 | Payer: Medicare Other | Source: Ambulatory Visit | Admitting: Internal Medicine

## 2015-04-09 ENCOUNTER — Encounter (HOSPITAL_COMMUNITY): Payer: Self-pay | Admitting: Gastroenterology

## 2015-04-09 DIAGNOSIS — N183 Chronic kidney disease, stage 3 unspecified: Secondary | ICD-10-CM | POA: Diagnosis present

## 2015-04-09 DIAGNOSIS — J441 Chronic obstructive pulmonary disease with (acute) exacerbation: Secondary | ICD-10-CM

## 2015-04-09 DIAGNOSIS — I9589 Other hypotension: Secondary | ICD-10-CM | POA: Diagnosis present

## 2015-04-09 LAB — BASIC METABOLIC PANEL
Anion gap: 7 (ref 5–15)
BUN: 31 mg/dL — AB (ref 6–20)
CO2: 36 mmol/L — ABNORMAL HIGH (ref 22–32)
CREATININE: 1.76 mg/dL — AB (ref 0.61–1.24)
Calcium: 9 mg/dL (ref 8.9–10.3)
Chloride: 95 mmol/L — ABNORMAL LOW (ref 101–111)
GFR calc Af Amer: 41 mL/min — ABNORMAL LOW (ref >60)
GFR, EST NON AFRICAN AMERICAN: 35 mL/min — AB (ref >60)
Glucose, Bld: 261 mg/dL — ABNORMAL HIGH (ref 65–99)
POTASSIUM: 4.6 mmol/L (ref 3.5–5.1)
SODIUM: 138 mmol/L (ref 135–145)

## 2015-04-09 LAB — CBC
HCT: 39.5 % (ref 39.0–52.0)
Hemoglobin: 12.2 g/dL — ABNORMAL LOW (ref 13.0–17.0)
MCH: 26.7 pg (ref 26.0–34.0)
MCHC: 30.9 g/dL (ref 30.0–36.0)
MCV: 86.4 fL (ref 78.0–100.0)
PLATELETS: 190 10*3/uL (ref 150–400)
RBC: 4.57 MIL/uL (ref 4.22–5.81)
RDW: 14.3 % (ref 11.5–15.5)
WBC: 10.8 10*3/uL — ABNORMAL HIGH (ref 4.0–10.5)

## 2015-04-09 LAB — GLUCOSE, CAPILLARY
GLUCOSE-CAPILLARY: 207 mg/dL — AB (ref 65–99)
GLUCOSE-CAPILLARY: 280 mg/dL — AB (ref 65–99)
GLUCOSE-CAPILLARY: 191 mg/dL — AB (ref 65–99)
Glucose-Capillary: 70 mg/dL (ref 65–99)
Glucose-Capillary: 77 mg/dL (ref 65–99)

## 2015-04-09 SURGERY — ESOPHAGOGASTRODUODENOSCOPY (EGD) WITH PROPOFOL
Anesthesia: Monitor Anesthesia Care

## 2015-04-09 MED ORDER — PREDNISONE 10 MG PO TABS
10.0000 mg | ORAL_TABLET | Freq: Two times a day (BID) | ORAL | Status: DC
  Administered 2015-04-10 (×2): 10 mg via ORAL
  Filled 2015-04-09 (×2): qty 1

## 2015-04-09 NOTE — Progress Notes (Signed)
[place] TEAM 1 - Stepdown/ICU TEAM Progress Note  Antonio Ray:[id number] DOB: July 10, 1934 DOA: 03/31/2015 PCP: [name], MD  Admit HPI / Brief Narrative: 81 y.o. WM PMHx Memory Loss, Chronic Systolic CHF having ejection fraction 35-40%, CAD native artery, PVCs, Anterior MI, S/P AICD, COPD, HLD CKD stage III, DM Type 2   Was discharge on 8/23 after tx for aspiration PNA and achalasia only to return to the ED 8/30 secondary to increasing cough and shortness of breath. In the ED the patient had RLL infiltrate on CXR and WBc's 18.1, temp 101.2, HR 124, RR 39; lactic acid 2.4. He was admitted the the hospital, was being tx for possible HCAP v/s probable simple aspiration pneumonitis, and had stabilized nicely. He was tx from SDU to floor, and on 9/1 pts son found him lethargic w/ agonal respirations. The son bagged him, a code blue was called, and he was subsequently intubated by anesthesia. PCCM assumed his care upon his arrival to the MICU.  In the ICU, the pt self-extubated 9/2>9/3 night/early morning. He remained stable off the vent, and was not re-intubated. He was transferred to the SDU on 9/3, and TRH resumed his care on 9/4.   HPI/Subjective: 9/8 A/O 4, negative CP, negative SOB, patient does become dizzy for approximately 15 seconds after standing (most likely orthostatic). Negative dysphagia, negative N/V post Botox injection. Positive continue hoarseness, but improved   Assessment/Plan: Acute hypoxic respiratory arrest 9/1 -Pt found apneic in his hospital room by his son, who himself initiated Code Blue procedure,appears to have been a pure respiratory event - intubated 9/1 and self extubated 9/3 -Still requiring 2 L O2 via [place] to maintain SPO2 mid 90s   Sepsis due to RLL aspiration PNA/pneumonitis -Sepsis physiology has resolved  -Completed 7 days of antibiotics  -Decrease prednisone 10 mg BID, -Continue DuoNeb QID -S/P PICC line placement   COPD w/ acute  bronchospastic exacerbation  -See sepsis due to RLL aspiration    Achalasia -S/P  EGD/Botox injection    Ischemic cardiomyopathy/Acute on Chronic systolic congestive heart failure/AICD -Previous EF 30-35% June 2016  - TTE this admit notes decline in EF to 10-15%  - recent discharge weight of 83 kg > current weight 92 kg - follow -strict -strict I&O; + 926ml  - Cardiology following  - continue diuresis Lasix 40 mg daily  - reportedly unable to tolerate BB due to orthostasis  Hypotension -For patient of his age BP running low, stop losartan -Obtain orthostatic vitals  Mildly elevated troponin -Cardiology does not feel this is indicative of ACS   CAD native artery -PCI to LAD 2006  - follow-up cardiac cath 2007 with no intervention required  DM Type 2 Uncontrolled  -8/21 hemoglobin A1c = 8.5  -Continue Lantus 12 units daily -Increase to moderate SSI  CKD stage III(Baseline Cr~1.6-1.9)  - creatinine at baseline but trending up    Code Status: FULL Family Communication: no family present at time of exam Disposition Plan: CIR vs SNF    Consultants: [facility] Dr.James Allred (EP) Dr. Nadean Corwin [facility]  Dr.Clinton D Young PCCM   Procedure/Significant Events: 9/1 echocardiogram;Left ventricle:  mild LVH. -LVEF= 10% -15%. Diffuse hypokinesis. Akinesis of anteroseptal myocardium.    Culture 8/30 blood left arm negative 8/30 urine negative 8/30 and 9/1 MRSA by PCR negative 8/31 sputum negative   Antibiotics: Zosyn 8/30 > stopped 9/7 Vancomycin 8/30 > stopped 9/7   DVT prophylaxis: Subcutaneous heparin    Devices  LINES / TUBES:      Continuous Infusions: . sodium chloride Stopped (04/07/15 1657)    Objective: VITAL SIGNS: Temp: 97.6 F (36.4 C) (09/08 1159) Temp Source: Oral (09/08 1159) BP: 107/62 mmHg (09/08 1159) Pulse Rate: 78 (09/08 1159) SPO2; FIO2:   Intake/Output Summary (Last 24 hours) at 04/09/15 1411 Last  data filed at 04/09/15 1336  Gross per 24 hour  Intake  629.5 ml  Output   3850 ml  Net -3220.5 ml     Exam: General: A/O 4, positive Acute respiratory distress (but improved per patient from admission) Eyes: Negative headache,scleral hemorrhage ENT: Negative Runny nose, negative gingival bleeding, Neck:  Negative scars, masses, torticollis, lymphadenopathy, JVD Lungs: diffuse poor air movement, without wheezes or crackles Cardiovascular: Regular rate and rhythm without murmur gallop or rub normal S1 and S2 Abdomen:negative abdominal pain, negative dysphagia, nondistended, positive soft, bowel sounds, no rebound, no ascites, no appreciable mass Extremities: No significant cyanosis, clubbing, or edema bilateral lower extremities Psychiatric:  Negative depression, negative anxiety, negative fatigue, negative mania  Neurologic:  Cranial nerves II through XII intact, tongue/uvula midline, all extremities muscle strength 5/5, sensation intact throughout,  negative dysarthria, negative expressive aphasia, negative receptive aphasia.   Data Reviewed: Basic Metabolic Panel:  Recent Labs Lab 04/03/15 0130 04/03/15 0143 04/04/15 0504 04/05/15 0458 04/06/15 0530 04/08/15 0450 04/09/15 0130  NA 140  --  140 143 142 140 138  K 3.6  --  4.0 3.6 4.4 4.4 4.6  CL 108  --  107 106 103 96* 95*  CO2 22  --  28 31 34* 38* 36*  GLUCOSE 203*  --  151* 119* 195* 179* 261*  BUN 23*  --  23* 29* 28* 27* 31*  CREATININE 1.43*  --  1.27* 1.32* 1.26* 1.43* 1.76*  CALCIUM 8.2*  --  7.8* 8.0* 8.0* 8.8* 9.0  MG  --  2.0 2.2 2.4  --  2.2  --   PHOS 1.4*  --  3.0 2.6  --   --   --    Liver Function Tests:  Recent Labs Lab 04/03/15 0130  ALBUMIN 2.3*   No results for input(s): LIPASE, AMYLASE in the last 168 hours. No results for input(s): AMMONIA in the last 168 hours. CBC:  Recent Labs Lab 04/03/15 0143 04/04/15 0504 04/05/15 0458 04/06/15 0530 04/08/15 0450 04/09/15 0130  WBC 13.2*  17.8* 12.1* 9.4 7.9 10.8*  NEUTROABS 12.6*  --   --   --  6.9  --   HGB 11.3* 11.5* 11.2* 11.3* 12.3* 12.2*  HCT 37.1* 37.4* 37.0* 37.3* 40.7 39.5  MCV 87.1 87.6 87.3 87.4 87.2 86.4  PLT 182 173 193 198 205 190   Cardiac Enzymes:  Recent Labs Lab 04/03/15 0630 04/03/15 1245 04/03/15 1916 04/04/15 0100 04/04/15 0754  TROPONINI 0.90* 0.70* 0.54* 0.49* 0.39*   BNP (last 3 results)  Recent Labs  02/07/15 1300 03/21/15 1055 04/02/15 1015  BNP 131.2* 100.9* 906.6*    ProBNP (last 3 results) No results for input(s): PROBNP in the last 8760 hours.  CBG:  Recent Labs Lab 04/08/15 1723 04/08/15 1943 04/09/15 0746 04/09/15 1158 04/09/15 1220  GLUCAP 138* 131* 207* 70 77    Recent Results (from the past 240 hour(s))  Blood Culture (routine x 2)     Status: None   Collection Time: 03/31/15 11:00 AM  Result Value Ref Range Status   Specimen Description BLOOD LEFT ANTECUBITAL  Final   Special Requests BOTTLES DRAWN  AEROBIC AND ANAEROBIC 5ML  Final   Culture NO GROWTH 5 DAYS  Final   Report Status 04/05/2015 FINAL  Final  Blood Culture (routine x 2)     Status: None   Collection Time: 03/31/15 11:17 AM  Result Value Ref Range Status   Specimen Description BLOOD LEFT ARM  Final   Special Requests BOTTLES DRAWN AEROBIC AND ANAEROBIC 5ML  Final   Culture NO GROWTH 5 DAYS  Final   Report Status 04/05/2015 FINAL  Final  Urine culture     Status: None   Collection Time: 03/31/15 11:40 AM  Result Value Ref Range Status   Specimen Description URINE, CLEAN CATCH  Final   Special Requests NONE  Final   Culture NO GROWTH 1 DAY  Final   Report Status 04/01/2015 FINAL  Final  MRSA PCR Screening     Status: None   Collection Time: 03/31/15  5:09 PM  Result Value Ref Range Status   MRSA by PCR NEGATIVE NEGATIVE Final    Comment:        The GeneXpert MRSA Assay (FDA approved for NASAL specimens only), is one component of a comprehensive MRSA colonization surveillance  program. It is not intended to diagnose MRSA infection nor to guide or monitor treatment for MRSA infections.   Culture, sputum-assessment     Status: None   Collection Time: 04/01/15  6:15 AM  Result Value Ref Range Status   Specimen Description SPUTUM  Final   Special Requests NONE  Final   Sputum evaluation   Final    THIS SPECIMEN IS ACCEPTABLE. RESPIRATORY CULTURE REPORT TO FOLLOW.   Report Status 04/01/2015 FINAL  Final  Culture, respiratory (NON-Expectorated)     Status: None   Collection Time: 04/01/15  6:15 AM  Result Value Ref Range Status   Specimen Description EXPECTORATED SPUTUM  Final   Special Requests NONE  Final   Gram Stain   Final    FEW WBC PRESENT, PREDOMINANTLY PMN RARE SQUAMOUS EPITHELIAL CELLS PRESENT RARE GRAM POSITIVE COCCI IN PAIRS RARE GRAM NEGATIVE COCCI RARE GRAM NEGATIVE RODS    Culture   Final    NORMAL OROPHARYNGEAL FLORA Performed at [facility]    Report Status 04/03/2015 FINAL  Final  MRSA PCR Screening     Status: None   Collection Time: 04/02/15 10:12 AM  Result Value Ref Range Status   MRSA by PCR NEGATIVE NEGATIVE Final    Comment:        The GeneXpert MRSA Assay (FDA approved for NASAL specimens only), is one component of a comprehensive MRSA colonization surveillance program. It is not intended to diagnose MRSA infection nor to guide or monitor treatment for MRSA infections.      Studies:  Recent x-ray studies have been reviewed in detail by the Attending Physician  Scheduled Meds:  Scheduled Meds: . antiseptic oral rinse  7 mL Mouth Rinse QID  . aspirin  81 mg Oral Daily  . chlorhexidine gluconate  15 mL Mouth Rinse BID  . donepezil  10 mg Oral q morning - 10a  . furosemide  40 mg Oral Daily  . heparin  5,000 Units Subcutaneous 3 times per day  . insulin aspart  0-15 Units Subcutaneous TID WC  . insulin glargine  12 Units Subcutaneous q morning - 10a  . ipratropium-albuterol  3 mL Nebulization QID  .  losartan  25 mg Oral Daily  . pantoprazole  40 mg Oral Daily  . pravastatin  40 mg Oral QPM  . predniSONE  20 mg Oral BID WC    Time spent on care of this patient: 40 mins   WOODS, Geraldo Docker , MD  [facility] Office  [phone] Pager - [phone]  On-Call/Text Page:      [email or web address]      password [email or web address]  If 7PM-7AM, please contact night-coverage [email or web address] Password [email or web address] 04/09/2015, 2:11 PM   LOS: 9 days   Care during the described time interval was provided by me .  I have reviewed this patient's available data, including medical history, events of note, physical examination, and all test results as part of my evaluation. I have personally reviewed and interpreted all radiology studies.   Dia Crawford, MD [phone] Pager

## 2015-04-09 NOTE — Progress Notes (Signed)
Physical Therapy Treatment [name] MRN: [id number] DOB: [birth year] Today's Date: 04/09/2015    History of Present Illness Antonio Ray is a 81 y.o. male with a past medical history of chronic systolic congestive heart failure having ejection fraction 35-40%, chronic kidney disease, established history of coronary artery disease; who was recently admitted and discharge on 8/23 due to aspiration PNA and achalasia; who returned to ED secondary to increasing cough and shortness of breath. Patient would be admitted due to sepsis 2/2 PNA (HCAP vs aspiration). Pt with code blue for respiratory distress this admission and intubated on 04-02-15 for one day. Extubated 04-03-15. S/p Esophagogastroduodenoscopy and botox injection 09/07.     PT Comments    Patient seated in recliner and eager to participate in PT today. Patient was able to ambulate and transfer as described below. He required use of RW today as he felt unsteady on his feet initially. He initially reported not feeling any different, but stated that he felt much better after walking a significantly further distance than previous session, and is now eager to regain strength in his legs. He was educated on continuing to perform exercise in the chair as well as walk multiple times each day with nursing staff over the weekend. Patient will benefit from continued PT to attempt to wean patient off oxygen during ambulation and improve steadiness with gait to return him to independent ambulation.    Follow Up Recommendations  No PT follow up;Supervision - Intermittent     Equipment Recommendations  Other (comment) (TBA)    Recommendations for Other Services       Precautions / Restrictions Precautions Precautions: Fall Restrictions Weight Bearing Restrictions: No    Mobility  Bed Mobility               General bed mobility comments: In chair upon PT arrival.  Transfers Overall transfer level: Needs  assistance Equipment used: Rolling walker (2 wheeled) Transfers: Sit to/from Stand Sit to Stand: Supervision         General transfer comment: Felt somewhat unsteady on his feet. Stood up once, but he fatigued quickly and agreed to use of RW. Steady and not fatigued for second stand using RW.  Ambulation/Gait Ambulation/Gait assistance: Min guard Ambulation Distance (Feet): 360 Feet Assistive device: Rolling walker (2 wheeled) Gait Pattern/deviations: Step-through pattern;Trunk flexed   Gait velocity interpretation: at or above normal speed for age/gender General Gait Details: Much faster and more stable gait with use of RW. See Vitals section below for O2 sats. Took one standing rest break. Required VC's for pursed lip breathing to improve O2 sats. Denied dizziness of SOB.   Stairs            Wheelchair Mobility    Modified Rankin (Stroke Patients Only)       Balance Overall balance assessment: Modified Independent                                  Cognition Arousal/Alertness: Awake/alert Behavior During Therapy: WFL for tasks assessed/performed Overall Cognitive Status: Within Functional Limits for tasks assessed                      Exercises      General Comments        Pertinent Vitals/Pain Pain Assessment: No/denies pain  Patient Saturations on Room Air at Rest = 94-96%  Patient Saturations on  Room Air while Ambulating = 84%  Patient Saturations on 2 Liters of oxygen while Ambulating = 90-91%  Patient on 2L at rest = 99-100%  Patient on Room Air at Rest, after walking = 96-97%    Home Living                      Prior Function            PT Goals (current goals can now be found in the care plan section) Acute Rehab PT Goals Patient Stated Goal: Go home following procedure PT Goal Formulation: With patient Time For Goal Achievement: 04/19/15 Potential to Achieve Goals: Good Progress towards PT goals:  Progressing toward goals    Frequency  Min 3X/week    PT Plan Current plan remains appropriate    Co-evaluation             End of Session Equipment Utilized During Treatment: Gait belt;Oxygen Activity Tolerance: Patient tolerated treatment well Patient left: in chair;with call bell/phone within reach     Time: 1037-1100 PT Time Calculation (min) (ACUTE ONLY): 23 min  Charges:  $Gait Training: 8-22 mins $Therapeutic Activity: 8-22 mins                    G CodesRoanna Epley, SPT [phone] 04/09/2015, 1:20 PM  I have read, reviewed and agree with student's note.   [place] [phone] (pager)

## 2015-04-09 NOTE — Progress Notes (Signed)
          Daily Rounding Note  04/09/2015, 8:19 AM  LOS: 9 days   SUBJECTIVE:       Swallowing is better but still a little bit of problems with a sausage patty this  AM (was not chopped up) Breathing ok, not a lot of coughing  OBJECTIVE:         Vital signs in last 24 hours:    Temp:  [97.2 F (36.2 C)-98.4 F (36.9 C)] 97.4 F (36.3 C) (09/08 0747) Pulse Rate:  [60-78] 73 (09/08 0811) Resp:  [12-25] 22 (09/08 0811) BP: (108-151)/(51-91) 111/60 mmHg (09/08 0811) SpO2:  [90 %-100 %] 100 % (09/08 0811) Weight:  [182 lb 15.7 oz (83 kg)-186 lb 1.1 oz (84.4 kg)] 182 lb 15.7 oz (83 kg) (09/08 0500) Last BM Date: 04/08/15 Filed Weights   04/07/15 0500 04/08/15 2200 04/09/15 0500  Weight: 192 lb 6.4 oz (87.272 kg) 186 lb 1.1 oz (84.4 kg) 182 lb 15.7 oz (83 kg)   General: frail, comfortable alert   Heart: RRR Chest: reduced BS bil.  No coughing, some dyspnea but much improved Abdomen: soft, NT, active BS, ND.  Extremities: slight right pedal swelling  Neuro/Psych:  Pleasant, cooperative, appropriate.    Intake/Output from previous day: 09/07 0701 - 09/08 0700 In: 499.5 [I.V.:299.5; IV Piggyback:200] Out: 4700 [Urine:4700]  Intake/Output this shift:    Lab Results:  Recent Labs  04/08/15 0450 04/09/15 0130  WBC 7.9 10.8*  HGB 12.3* 12.2*  HCT 40.7 39.5  PLT 205 190   BMET  Recent Labs  04/08/15 0450 04/09/15 0130  NA 140 138  K 4.4 4.6  CL 96* 95*  CO2 38* 36*  GLUCOSE 179* 261*  BUN 27* 31*  CREATININE 1.43* 1.76*  CALCIUM 8.8* 9.0    Studies/Results: No results found.  ASSESMENT:   *  Achalasia, dysphagia, recurrrent aspiration PNAs.   9/7 EGD with Botox injection to GE Jx.    PLAN   *   No overt s/s aspiration on bedside swallow eval post EGD/Botox.  SLP dietary rec per 9/7 post EGD: D2, chopped, diet with thin liquids.  * has follow up with Dr Hilarie Fredrickson at GI office on 11/10, this was the  soonest appt available, if pt having issues can be seen urgently by APP. Signing off, call if questions.     Azucena Freed  04/09/2015, 8:19 AM Pager: [phone]   ________________________________________________________________________  Velora Heckler GI MD note:  I personally examined the patient, reviewed the data and agree with the assessment and plan described above.  Pretty good response so far, not complete improvement but "much better" per patient.  He knows he should still cut his food up into small bites, eat slowly and chew very well.  Please call or page with any further questions or concerns.  OK to d/c from GI perspective but obviously pulm issues require a bit more time in hosp.    Owens Loffler, MD [facility] Pager [phone]

## 2015-04-09 NOTE — Progress Notes (Signed)
SATURATION QUALIFICATIONS: (This note is used to comply with regulatory documentation for home oxygen)  Patient Saturations on Room Air at Rest = 94-96%  Patient Saturations on Room Air while Ambulating = 84%  Patient Saturations on 2 Liters of oxygen while Ambulating = 90-91%  Patient on 2L at rest = 99-100%  Patient on Room Air at Rest, after walking = 96-97%  Please briefly explain why patient needs home oxygen: Patient requires oxygen for ambulation due to sats below 88%, even after cues for pursed lip breathing and standing rest break.   Roanna Epley, SPT [phone] I have read, reviewed and agree with student's note.   [place] [phone] (pager)

## 2015-04-10 DIAGNOSIS — N183 Chronic kidney disease, stage 3 unspecified: Secondary | ICD-10-CM | POA: Diagnosis present

## 2015-04-10 LAB — GLUCOSE, CAPILLARY
GLUCOSE-CAPILLARY: 294 mg/dL — AB (ref 65–99)
GLUCOSE-CAPILLARY: 232 mg/dL — AB (ref 65–99)
Glucose-Capillary: 232 mg/dL — ABNORMAL HIGH (ref 65–99)
Glucose-Capillary: 235 mg/dL — ABNORMAL HIGH (ref 65–99)
Glucose-Capillary: 243 mg/dL — ABNORMAL HIGH (ref 65–99)
Glucose-Capillary: 233 mg/dL — ABNORMAL HIGH (ref 65–99)

## 2015-04-10 NOTE — Care Management Important Message (Signed)
Important Message  Patient Details  Name: REYNOLDS KITTEL MRN: [id number] Date of Birth: [birth year]   Medicare Important Message Given:  Yes-third notification given    Nathen May 04/10/2015, 11:27 AMImportant Message  Patient Details  Name: DACE DENN MRN: [id number] Date of Birth: 11-16-1933   Medicare Important Message Given:  Yes-third notification given    Nathen May 04/10/2015, 11:26 AM

## 2015-04-10 NOTE — Care Management Note (Signed)
Case Management Note  Patient Details  Name: Antonio Ray MRN: [id number] Date of Birth: 19-May-1934  Subjective/Objective:          Adm w sepsis          Action/Plan:pt has supp fam, ref to [facility] made for hhc at disch. Pt states has rolling walker and high commode seat. He does not want any other eq.   Expected Discharge Date:                  Expected Discharge Plan:  [place]  In-House Referral:     Discharge planning Services  CM Consult  Post Acute Care Choice:    Choice offered to:  Patient, Adult Children  DME Arranged:    DME Agency:     HH Arranged:  RN [place] Agency:  [place]  Status of Service:  Completed, signed off  Medicare Important Message Given:  Yes-third notification given Date Medicare IM Given:    Medicare IM give by:    Date Additional Medicare IM Given:    Additional Medicare Important Message give by:     If discussed at [place] of Stay Meetings, dates discussed:    Additional Comments:  Lacretia Leigh, RN 04/10/2015, 1:26 PM

## 2015-04-10 NOTE — Progress Notes (Signed)
Nutrition Follow-up  DOCUMENTATION CODES:   Not applicable  INTERVENTION:   No nutrition intervention warranted at this time   NUTRITION DIAGNOSIS:   Inadequate oral intake related to inability to eat as evidenced by NPO status, resolved  GOAL:   Patient will meet greater than or equal to 90% of their needs, met  MONITOR:   PO intake, Labs, Weight trends, I & O's  ASSESSMENT:   81 yo male former smoker with multiple medical problems including CAD, chronic sCHF, COPD, CKD, DM. Recently admitted 8/20-8/23 with AECOPD and ?aspiration PNA. Returned 8/30 with increased cough and SOB. Admitted with HCAP vs aspiration PNA. Respiratory status declined and required intubation on 9/1.   Pt self extubated 9/2.  Transferred out of [facility] 9/3.  S/p bedside swallow evaluation 9/3.    Pt s/p procedure 9/7: EGD W/DIRECTED SUBMUCOSAL INJECTION  Pt currently on a Dys 2, thin liquid diet.  Eating well.  PO intake 100% this AM for breakfast.  Diet Order:  DIET DYS 2 Room service appropriate?: Yes; Fluid consistency:: Thin  Skin:  Reviewed, no issues  Last BM:  9/8  Height:   Ht Readings from Last 1 Encounters:  04/04/15 5' 10"  (1.778 m)    Weight:   Wt Readings from Last 1 Encounters:  04/10/15 185 lb 3 oz (84 kg)    Ideal Body Weight:  75.5 kg  BMI:  Body mass index is 26.57 kg/(m^2).  Estimated Nutritional Needs:   Kcal:  1850-2050  Protein:  100-110 gm  Fluid:  1.8-2.0 L  EDUCATION NEEDS:   No education needs identified at this time  Arthur Holms, RD, LDN Pager #: [phone] After-Hours Pager #: [phone]

## 2015-04-10 NOTE — Progress Notes (Signed)
Speech Language Pathology Treatment: Dysphagia  Patient Details Name: EWELL BENASSI MRN: [id number] DOB: 1934-07-10 Today's Date: 04/10/2015 Time: 5208-0223 SLP Time Calculation (min) (ACUTE ONLY): 20 min  Assessment / Plan / Recommendation Clinical Impression  Skilled treatment session focused on addressing dysphagia goals.  Patient consumed regular textures and thin liquids with intermittent cues to alternate solids and liquids. Patient demonstrated efficient mastication without observed s/s of aspiration or reports of globus sensation.  Recommend diet advancement to Dys.3 textures with continuation of thin liquids and intermittent supervision.  Eduction completed with patient.  No oral or pharyngeal dysphagia present at this time; therefore SLP signing off.  MD to advance to regular textures as medically ready from a GI standpoint.       HPI Other Pertinent Information: DASCHEL ROUGHTON is a 81 y.o. male with a past medical history of chronic systolic congestive heart failure having ejection fraction 35-40%, chronic kidney disease, established history of coronary artery disease; who was recently admitted and discharge on 8/23 due to aspiration PNA and achalasia; who returned today to ED secondary to increasing cough and shortness of breath. Patient has RLL PNA on CXR and also WBc's 18.1, temp 101.2, HR 124, RR 39; lactic acid 2.4. Which appears to be secondary to aspiration again. Patient would be admitted due to sepsis 2/2 PNA (HCAP vs aspiration). Pt with code blue for respiratory distress this admission and intubated on 04-02-15 for one day. Extubated 04-03-15. Bedside swallow eval ordered post-self extubation and with recurring aspiration PNA to determine safest diet.   Pertinent Vitals Pain Assessment: No/denies pain  SLP Plan  All goals met    Recommendations Diet recommendations: Dysphagia 3 (mechanical soft);Thin liquid Liquids provided via: Cup;Straw Medication Administration: Whole meds  with liquid Supervision: Patient able to self feed;Intermittent supervision to cue for compensatory strategies Compensations: Small sips/bites;Slow rate;Follow solids with liquid Postural Changes and/or Swallow Maneuvers: Out of bed for meals;Seated upright 90 degrees;Upright 30-60 min after meal              Oral Care Recommendations: Oral care BID Follow up Recommendations: None Plan: All goals met    GO    Carmelia Roller., CCC-SLP [phone]  Kalynne Womac 04/10/2015, 11:09 AM

## 2015-04-10 NOTE — Progress Notes (Signed)
[place] TEAM 1 - Stepdown/ICU TEAM Progress Note  Antonio Ray NID:[id number] DOB: 06-Jun-1934 DOA: 03/31/2015 PCP: [name], MD  Admit HPI / Brief Narrative: 81 y.o. WM PMHx Memory Loss, Chronic Systolic CHF having ejection fraction 35-40%, CAD native artery, PVCs, Anterior MI, S/P AICD, COPD, HLD CKD stage III, DM Type 2   Was discharge on 8/23 after tx for aspiration PNA and achalasia only to return to the ED 8/30 secondary to increasing cough and shortness of breath. In the ED the patient had RLL infiltrate on CXR and WBc's 18.1, temp 101.2, HR 124, RR 39; lactic acid 2.4. He was admitted the the hospital, was being tx for possible HCAP v/s probable simple aspiration pneumonitis, and had stabilized nicely. He was tx from SDU to floor, and on 9/1 pts son found him lethargic w/ agonal respirations. The son bagged him, a code blue was called, and he was subsequently intubated by anesthesia. PCCM assumed his care upon his arrival to the MICU.  In the ICU, the pt self-extubated 9/2>9/3 night/early morning. He remained stable off the vent, and was not re-intubated. He was transferred to the SDU on 9/3, and [facility] resumed his care on 9/4.   HPI/Subjective: 9/9 A/O 4, negative CP, negative SOB, Negative dysphagia, negative N/V post Botox injection. Positive continue hoarseness, but improving    Assessment/Plan: Acute hypoxic respiratory arrest 9/1 -Pt found apneic in his hospital room by his son, who himself initiated Code Blue procedure,appears to have been a pure respiratory event - intubated 9/1 and self extubated 9/3 -Will require O2 at discharge; SATURATION QUALIFICATIONS: (This note is used to comply with regulatory documentation for home oxygen)  Patient Saturations on Room Air at Rest = 94-96%  Patient Saturations on Room Air while Ambulating = 84%  Patient Saturations on 2 Liters of oxygen while Ambulating = 90-91%  Patient on 2L at rest = 99-100%  Patient on Room  Air at Rest, after walking = 96-97%  Please briefly explain why patient needs home oxygen: Patient requires oxygen for ambulation due to sats below 88%, even after cues for pursed lip breathing and standing rest break.    Sepsis due to RLL aspiration PNA/pneumonitis -Sepsis physiology has resolved  -Completed 7 days of antibiotics  -DC prednisone  -Continue DuoNeb QID -S/P PICC line placement   COPD w/ acute bronchospastic exacerbation  -See sepsis due to RLL aspiration    Achalasia -S/P  EGD/Botox injection    Ischemic cardiomyopathy/Acute on Chronic systolic congestive heart failure/AICD -Previous EF 30-35% June 2016  - TTE this admit notes decline in EF to 10-15%  - recent discharge weight of 83 kg; 9/9 weight= 84 kg - follow -strict -strict I&O;-9.1 L  - Cardiology following  - continue diuresis Lasix 40 mg daily  - reportedly unable to tolerate BB due to orthostasis  Hypotension -For patient of his age BP running low, stop losartan -Orthostatic vitals; patient not orthostatic  Mildly elevated troponin -Cardiology does not feel this is indicative of ACS   CAD native artery -PCI to LAD 2006  - follow-up cardiac cath 2007 with no intervention required  DM Type 2 Uncontrolled  -8/21 hemoglobin A1c = 8.5  -Continue Lantus 12 units daily -Continue moderate SSI; note prednisone DC today  CKD stage III(Baseline Cr~1.6-1.9)  - creatinine at baseline but trending up    Code Status: FULL Family Communication: Son present at time of exam Disposition Plan: Home    Consultants:  GI Dr.James Allred (EP)  Dr. Nadean [facility]  Dr.Clinton D Young PCCM   Procedure/Significant Events: 9/1 echocardiogram;Left ventricle:  mild LVH. -LVEF= 10% -15%. Diffuse hypokinesis. Akinesis of anteroseptal myocardium.    Culture 8/30 blood left arm negative 8/30 urine negative 8/30 and 9/1 MRSA by PCR negative 8/31 sputum negative   Antibiotics: Zosyn  8/30 > stopped 9/7 Vancomycin 8/30 > stopped 9/7   DVT prophylaxis: Subcutaneous heparin    Devices    LINES / TUBES:      Continuous Infusions: . sodium chloride Stopped (04/07/15 1657)    Objective: VITAL SIGNS: Temp: 97.9 F (36.6 C) (09/09 1744) Temp Source: Axillary (09/09 1744) BP: 110/66 mmHg (09/09 1744) Pulse Rate: 93 (09/09 1744) SPO2; FIO2:   Intake/Output Summary (Last 24 hours) at 04/10/15 1908 Last data filed at 04/10/15 1746  Gross per 24 hour  Intake    160 ml  Output   3800 ml  Net  -3640 ml     Exam: General: A/O 4, positive negative Acute respiratory distress  Eyes: Negative headache,scleral hemorrhage ENT: Negative Runny nose, negative gingival bleeding, Neck:  Negative scars, masses, torticollis, lymphadenopathy, JVD Lungs: clear to auscultation bilaterally except for mild expiratory wheezing bibasilar, negative crackles Cardiovascular: Regular rate and rhythm without murmur gallop or rub normal S1 and S2 Abdomen:negative abdominal pain, negative dysphagia, nondistended, positive soft, bowel sounds, no rebound, no ascites, no appreciable mass Extremities: No significant cyanosis, clubbing, or edema bilateral lower extremities Psychiatric:  Negative depression, negative anxiety, negative fatigue, negative mania  Neurologic:  Cranial nerves II through XII intact, tongue/uvula midline, all extremities muscle strength 5/5, sensation intact throughout,  negative dysarthria, negative expressive aphasia, negative receptive aphasia.   Data Reviewed: Basic Metabolic Panel:  Recent Labs Lab 04/04/15 0504 04/05/15 0458 04/06/15 0530 04/08/15 0450 04/09/15 0130  NA 140 143 142 140 138  K 4.0 3.6 4.4 4.4 4.6  CL 107 106 103 96* 95*  CO2 28 31 34* 38* 36*  GLUCOSE 151* 119* 195* 179* 261*  BUN 23* 29* 28* 27* 31*  CREATININE 1.27* 1.32* 1.26* 1.43* 1.76*  CALCIUM 7.8* 8.0* 8.0* 8.8* 9.0  MG 2.2 2.4  --  2.2  --   PHOS 3.0 2.6  --   --    --    Liver Function Tests: No results for input(s): AST, ALT, ALKPHOS, BILITOT, PROT, ALBUMIN in the last 168 hours. No results for input(s): LIPASE, AMYLASE in the last 168 hours. No results for input(s): AMMONIA in the last 168 hours. CBC:  Recent Labs Lab 04/04/15 0504 04/05/15 0458 04/06/15 0530 04/08/15 0450 04/09/15 0130  WBC 17.8* 12.1* 9.4 7.9 10.8*  NEUTROABS  --   --   --  6.9  --   HGB 11.5* 11.2* 11.3* 12.3* 12.2*  HCT 37.4* 37.0* 37.3* 40.7 39.5  MCV 87.6 87.3 87.4 87.2 86.4  PLT 173 193 198 205 190   Cardiac Enzymes:  Recent Labs Lab 04/03/15 1916 04/04/15 0100 04/04/15 0754  TROPONINI 0.54* 0.49* 0.39*   BNP (last 3 results)  Recent Labs  02/07/15 1300 03/21/15 1055 04/02/15 1015  BNP 131.2* 100.9* 906.6*    ProBNP (last 3 results) No results for input(s): PROBNP in the last 8760 hours.  CBG:  Recent Labs Lab 04/10/15 0052 04/10/15 0408 04/10/15 0802 04/10/15 1205 04/10/15 1631  GLUCAP 232* 294* 232* 235* 243*    Recent Results (from the past 240 hour(s))  Culture, sputum-assessment     Status: None   Collection Time: 04/01/15  6:15 AM  Result Value Ref Range Status   Specimen Description SPUTUM  Final   Special Requests NONE  Final   Sputum evaluation   Final    THIS SPECIMEN IS ACCEPTABLE. RESPIRATORY CULTURE REPORT TO FOLLOW.   Report Status 04/01/2015 FINAL  Final  Culture, respiratory (NON-Expectorated)     Status: None   Collection Time: 04/01/15  6:15 AM  Result Value Ref Range Status   Specimen Description EXPECTORATED SPUTUM  Final   Special Requests NONE  Final   Gram Stain   Final    FEW WBC PRESENT, PREDOMINANTLY PMN RARE SQUAMOUS EPITHELIAL CELLS PRESENT RARE GRAM POSITIVE COCCI IN PAIRS RARE GRAM NEGATIVE COCCI RARE GRAM NEGATIVE RODS    Culture   Final    NORMAL OROPHARYNGEAL FLORA Performed at [facility]    Report Status 04/03/2015 FINAL  Final  MRSA PCR Screening     Status: None   Collection  Time: 04/02/15 10:12 AM  Result Value Ref Range Status   MRSA by PCR NEGATIVE NEGATIVE Final    Comment:        The GeneXpert MRSA Assay (FDA approved for NASAL specimens only), is one component of a comprehensive MRSA colonization surveillance program. It is not intended to diagnose MRSA infection nor to guide or monitor treatment for MRSA infections.      Studies:  Recent x-ray studies have been reviewed in detail by the Attending Physician  Scheduled Meds:  Scheduled Meds: . antiseptic oral rinse  7 mL Mouth Rinse QID  . aspirin  81 mg Oral Daily  . chlorhexidine gluconate  15 mL Mouth Rinse BID  . donepezil  10 mg Oral q morning - 10a  . furosemide  40 mg Oral Daily  . heparin  5,000 Units Subcutaneous 3 times per day  . insulin aspart  0-15 Units Subcutaneous TID WC  . insulin glargine  12 Units Subcutaneous q morning - 10a  . ipratropium-albuterol  3 mL Nebulization QID  . pantoprazole  40 mg Oral Daily  . pravastatin  40 mg Oral QPM    Time spent on care of this patient: 40 mins   Zaryan Yakubov, Geraldo Docker , MD  [facility] Office  [phone] Pager [phone]  On-Call/Text Page:      [email or web address]      password [email or web address]  If 7PM-7AM, please contact night-coverage [email or web address] Password [email or web address] 04/10/2015, 7:08 PM   LOS: 10 days   Care during the described time interval was provided by me .  I have reviewed this patient's available data, including medical history, events of note, physical examination, and all test results as part of my evaluation. I have personally reviewed and interpreted all radiology studies.   Dia Crawford, MD [phone] Pager

## 2015-04-11 LAB — GLUCOSE, CAPILLARY
Glucose-Capillary: 266 mg/dL — ABNORMAL HIGH (ref 65–99)
Glucose-Capillary: 141 mg/dL — ABNORMAL HIGH (ref 65–99)

## 2015-04-11 MED ORDER — INSULIN GLARGINE 100 UNIT/ML ~~LOC~~ SOLN
28.0000 [IU] | Freq: Every day | SUBCUTANEOUS | Status: DC
  Administered 2015-04-11: 28 [IU] via SUBCUTANEOUS
  Filled 2015-04-11: qty 0.28

## 2015-04-11 MED ORDER — INSULIN GLARGINE 100 UNIT/ML ~~LOC~~ SOLN: 24.0000 [IU] | Freq: Every morning | SUBCUTANEOUS | Status: DC

## 2015-04-11 MED ORDER — INSULIN GLARGINE 100 UNIT/ML ~~LOC~~ SOLN
20.0000 [IU] | Freq: Every morning | SUBCUTANEOUS | Status: DC
  Filled 2015-04-11: qty 0.2

## 2015-04-11 MED ORDER — FUROSEMIDE 20 MG PO TABS: 40.0000 mg | ORAL_TABLET | Freq: Every day | ORAL | Status: DC

## 2015-04-11 MED ORDER — INSULIN GLARGINE 100 UNIT/ML ~~LOC~~ SOLN: 28.0000 [IU] | Freq: Every morning | SUBCUTANEOUS | Status: DC

## 2015-04-11 NOTE — Progress Notes (Signed)
SATURATION QUALIFICATIONS: (This note is used to comply with regulatory documentation for home oxygen)  Patient Saturations on Room Air at Rest = 98%  Patient Saturations on Room Air while Ambulating = 89%  Patient Saturations on 2 Liters of oxygen while Ambulating = 95%  Please briefly explain why patient needs home oxygen:Patient's oxygen saturation decreased while ambulating. Patient short of breath while ambulating.

## 2015-04-11 NOTE — Care Management Note (Signed)
Case Management Note  Patient Details  Name: Antonio Ray MRN: [id number] Date of Birth: [birth year]  Subjective/Objective:                   Systolic CHF Action/Plan:  Discharge planning Expected Discharge Date:  04/11/15               Expected Discharge Plan:  [place]  In-House Referral:     Discharge planning Services  CM Consult  Post Acute Care Choice:    Choice offered to:  Patient, Adult Children  DME Arranged:  Oxygen DME Agency:  [place]:  RN [facility] Agency:  [place]  Status of Service:  Completed, signed off  Medicare Important Message Given:  Yes-third notification given Date Medicare IM Given:    Medicare IM give by:    Date Additional Medicare IM Given:    Additional Medicare Important Message give by:     If discussed at [place] of Stay Meetings, dates discussed:    Additional Comments: CM called [facility] DME rep, Merry Proud to please deliver the oxygen tank for home oxygen to room prior to discharge.  CM has requested of RN to please place the pulmonary saturation note.  CM notified [facility] rep, Kristen of pt discharge and need for O2.  No other CM needs were communicated. Dellie Catholic, RN 04/11/2015, 9:40 AM

## 2015-04-11 NOTE — Progress Notes (Signed)
Patient/Patient son given discharge instructions. [facility] educated patient on home O2. Patient verbalized understanding. Patient discharged home with son.

## 2015-04-11 NOTE — Discharge Summary (Signed)
Physician Discharge Summary  Antonio Ray QBH:[id number] DOB: [birth year] DOA: 03/31/2015  PCP: [name], MD  Admit date: 03/31/2015 Discharge date: 04/11/2015  Time spent: 40 minutes  Recommendations for Outpatient Follow-up:  Acute respiratory failure with hypoxia; Respiratory Arrest arrest 9/1 -Pt found apneic in his hospital room by his son, who himself initiated Code Blue procedure,appears to have been a pure respiratory event - intubated 9/1 and self extubated 9/3 -Will require O2 at discharge; SATURATION QUALIFICATIONS: (This note is used to comply with regulatory documentation for home oxygen)  Patient Saturations on Room Air at Rest = 94%  Patient Saturations on Room Air while Ambulating = 84%  Patient Saturations on 2 Liters of oxygen while Ambulating = 91%  Patient on 2L at rest = 99%  Patient on Room Air at Rest, after walking = 96%  Please briefly explain why patient needs home oxygen: Patient requires oxygen for ambulation due to sats below 88%, even after cues for pursed lip breathing and standing rest break.    Sepsis due to RLL aspiration PNA/pneumonitis -Sepsis physiology has resolved  -Completed 7 days of antibiotics  -Follow-up with Dr. Edd Arbour (PCP) in 7-10 days  COPD w/ acute bronchospastic exacerbation  -See sepsis due to RLL aspiration   Achalasia -S/P EGD/Botox injection -Follow-up with Dr. Zenovia Jarred (GI) on 06/11/2015   Ischemic cardiomyopathy/Acute on Chronic systolic congestive heart failure/AICD -Previous EF 30-35% June 2016  - TTE this admit notes decline in EF to 10-15%  - Discharge weight standing = 83.6 kg (184 pounds) - strict -strict I&O; at discharge since admission -11.4 L - continue diuresis Lasix 40 mg daily Sliding scale Lasix: Weigh yourself when you get home, then Daily in the Morning. Your dry weight will be what your scale says on the day you return home.   If you gain more than 3 pounds from dry  weight: Increase the Lasix dosing to 40mg  in the morning and 20 mg in the afternoon until weight returns to baseline dry weight.  If weight gain is greater than 5 pounds in 2 days: Increased to Lasix 40 mg twice a day and contact cardiology office for further assistance if weight does not go down the next day.  If the weight goes down more than 3 pounds from dry weight: Hold Lasix until it returns to baseline dry weight -Follow-up in 2 weeks with Dr. Lyman Bishop, (cardiology) ischemic cardiomyopathy, acute respiratory failure with hypoxia  Hypotension -For patient of his age BP running low, stop losartan -Orthostatic vitals; patient not orthostatic  Mildly elevated troponin -Cardiology does not feel this is indicative of ACS   CAD native artery -PCI to LAD 2006 - follow-up cardiac cath 2007 with no intervention required  DM Type 2 Uncontrolled  -8/21 hemoglobin A1c = 8.5  -Increase home dose Lantus 28 units daily  CKD stage III(Baseline Cr~1.6-1.9) - creatinine at baseline     Discharge Diagnoses:  Principal Problem:   Sepsis Active Problems:   Sick sinus syndrome   Acute on chronic combined systolic and diastolic congestive heart failure, NYHA class 3   Diabetes mellitus type II, non insulin dependent   S/P ICD (internal cardiac defibrillator) procedure   COPD (chronic obstructive pulmonary disease)   CKD (chronic kidney disease) stage 3, GFR 30-59 ml/min   Automatic implantable cardioverter-defibrillator in situ   Pneumonia, community acquired   Renal failure (ARF), acute on chronic   Leukocytosis   COPD GOLD IV   HCAP (healthcare-associated pneumonia)  Achalasia   Acute respiratory failure with hypoxia   Cardiomyopathy, ischemic   Elevated troponin   CAD (coronary artery disease), native coronary artery   Sepsis due to pneumonia   Aspiration pneumonia due to food (regurgitated)   Pneumonitis   Other emphysema   Systolic CHF, acute on chronic   AICD  (automatic cardioverter/defibrillator) present   CAD in native artery   Diabetes type 2, uncontrolled   Other specified hypotension   CKD (chronic kidney disease), stage III   Chronic kidney disease, stage III (moderate)   Discharge Condition: Stable   Diet recommendation: American diabetic Association  Filed Weights   04/08/15 2200 04/09/15 0500 04/10/15 0500  Weight: 84.4 kg (186 lb 1.1 oz) 83 kg (182 lb 15.7 oz) 84 kg (185 lb 3 oz)    History of present illness:  81 y.o. WM PMHx Memory Loss, Chronic Systolic CHF having ejection fraction 35-40%, CAD native artery, PVCs, Anterior MI, S/P AICD, COPD, HLD CKD stage III, DM Type 2   Was discharge on 8/23 after tx for aspiration PNA and achalasia only to return to the ED 8/30 secondary to increasing cough and shortness of breath. In the ED the patient had RLL infiltrate on CXR and WBc's 18.1, temp 101.2, HR 124, RR 39; lactic acid 2.4. He was admitted the the hospital, was being tx for possible HCAP v/s probable simple aspiration pneumonitis, and had stabilized nicely. He was tx from SDU to floor, and on 9/1 pts son found him lethargic w/ agonal respirations. The son bagged him, a code blue was called, and he was subsequently intubated by anesthesia. PCCM assumed his care upon his arrival to the MICU.  In the ICU, the pt self-extubated 9/2>9/3 night/early morning. He remained stable off the vent, and was not re-intubated. He was transferred to the SDU on 9/3, and [facility] resumed his care on 9/4.  During his hospitalization patient was treated for acute respiratory failure with hypoxia secondary to worsening ischemic cardiomyopathy. After aggressive diuresis patient respiratory distress resolved unfortunately it appears that patient's new baseline will require home O2. In addition patient's diabetes was found to be uncontrolled hemoglobin A1c= 8.5. This was partially due to patient's requirement for steroids. Adjustments were made to patient's  Lantus and his diabetes will need to be followed closely by his PCP for further adjustments on medication.   Consultants: [facility] Dr.James Allred (EP) Dr. Nadean Corwin [facility]  Dr.Clinton D Young PCCM   Procedure/Significant Events: 9/1 echocardiogram;Left ventricle: mild LVH. -LVEF= 10% -15%. Diffuse hypokinesis. Akinesis of anteroseptal myocardium.    Culture 8/30 blood left arm negative 8/30 urine negative 8/30 and 9/1 MRSA by PCR negative 8/31 sputum negative   Antibiotics: Zosyn 8/30 > stopped 9/7 Vancomycin 8/30 > stopped 9/7     Discharge Exam: Filed Vitals:   04/10/15 2300 04/10/15 2335 04/11/15 0436 04/11/15 0827  BP:  102/35 103/48 109/57  Pulse:  83 76 80  Temp:  97.7 F (36.5 C) 97.6 F (36.4 C) 97.9 F (36.6 C)  TempSrc:  Oral Oral Oral  Resp:  24 16 24   Height:      Weight:      SpO2: 94%   95%    General: A/O 4, positive negative Acute respiratory distress  Eyes: Negative headache,scleral hemorrhage ENT: Negative Runny nose, negative gingival bleeding, Neck: Negative scars, masses, torticollis, lymphadenopathy, JVD Lungs: clear to auscultation bilaterally, negative crackles Cardiovascular: Regular rate and rhythm without murmur gallop or rub normal S1 and  S2 Abdomen:negative abdominal pain, negative dysphagia, nondistended, positive soft, bowel sounds, no rebound, no ascites, no appreciable mass   Discharge Instructions     Medication List    ASK your doctor about these medications        aspirin 81 MG EC tablet  Take 81 mg by mouth daily.     budesonide-formoterol 160-4.5 MCG/ACT inhaler  Commonly known as:  SYMBICORT  Inhale 2 puffs into the lungs 2 (two) times daily.     donepezil 10 MG tablet  Commonly known as:  ARICEPT  Take 10 mg by mouth every morning.     fenofibrate 145 MG tablet  Commonly known as:  TRICOR  TAKE 1 TABLET BY MOUTH EVERY DAY     furosemide 20 MG tablet  Commonly known as:  LASIX   TAKE 1 TABLET BY MOUTH EVERY DAY     insulin glargine 100 UNIT/ML injection  Commonly known as:  LANTUS  Inject 24 Units into the skin every morning.     losartan 25 MG tablet  Commonly known as:  COZAAR  Take 1 tablet (25 mg total) by mouth daily.     memantine 10 MG tablet  Commonly known as:  NAMENDA  Take 1 tablet (10 mg total) by mouth 2 (two) times daily.     nitroGLYCERIN 0.2 mg/hr patch  Commonly known as:  NITRODUR - Dosed in mg/24 hr  Place 0.2 mg onto the skin daily.     pantoprazole 20 MG tablet  Commonly known as:  PROTONIX  TAKE 1 TABLET BY MOUTH EVERY DAY     pravastatin 40 MG tablet  Commonly known as:  PRAVACHOL  TAKE 1 TABLET BY MOUTH EVERY EVENING     PRESERVISION AREDS 2 Caps  Take 1 capsule by mouth 2 (two) times daily.     PROVENTIL HFA 108 (90 BASE) MCG/ACT inhaler  Generic drug:  albuterol  Inhale 2 puffs into the lungs every 6 (six) hours as needed for wheezing or shortness of breath. As directed     tiotropium 18 MCG inhalation capsule  Commonly known as:  SPIRIVA  Place 1 capsule (18 mcg total) into inhaler and inhale daily.     VITAMIN D PO  Take 2,000 Units by mouth daily.       Allergies  Allergen Reactions  . Codeine Other (See Comments)    Gets very angry, disoriented  . Dilaudid [Hydromorphone Hcl] Other (See Comments)    VERY AGITATED, HOSTILE  . Flomax [Tamsulosin Hcl] Shortness Of Breath  . Morphine And Related Other (See Comments)    VERY AGITATED, HOSTILE  . Sulfa Antibiotics Shortness Of Breath  . Beta Adrenergic Blockers     ? disorientation  . Carvedilol Other (See Comments)    DISORIENTATION  . Levofloxacin Other (See Comments)    unknown       Follow-up Information    Follow up with Jerene Bears, MD On 06/11/2015.   Specialty:  Gastroenterology   Why:  11 AM follow up for swallowing problems   Contact information:   [address] [place] [address] [phone]        The results of  significant diagnostics from this hospitalization (including imaging, microbiology, ancillary and laboratory) are listed below for reference.    Significant Diagnostic Studies: Dg Chest 2 View  04/01/2015   CLINICAL DATA:  Shortness of breath, sepsis, history coronary artery disease post MI, chronic LEFT ventricular systolic dysfunction, CHF, COPD, GERD, type  II diabetes mellitus, former smoker  EXAM: CHEST  2 VIEW  COMPARISON:  03/31/2015  FINDINGS: LEFT subclavian transvenous pacemaker leads project at RIGHT atrium and RIGHT ventricle, stable.  Normal heart size, mediastinal contours, and pulmonary vascularity.  Emphysematous and bronchitic changes consistent with COPD.  Increased RIGHT basilar atelectasis.  No definite acute infiltrate, pleural effusion, or pneumothorax.  Atherosclerotic calcification aortic arch.  Osseous demineralization without acute osseous findings.  IMPRESSION: COPD changes with increased RIGHT basilar atelectasis.   Electronically Signed   By: Lavonia Dana M.D.   On: 04/01/2015 07:38   Dg Chest 2 View  03/22/2015   CLINICAL DATA:  Patient with history of aspiration pneumonia.  EXAM: CHEST  2 VIEW  COMPARISON:  Chest radiograph 03/21/2015  FINDINGS: Dual lead pacer apparatus overlies the left hemi thorax, stable in position. Multiple monitoring leads overlie the patient. Stable enlarged cardiac and mediastinal contours. Heterogeneous opacities within the right mid and lower lung. No definite pleural effusion. Degenerative changes thoracic spine.  IMPRESSION: Right mid lower lung opacities likely represent scarring.   Electronically Signed   By: Lovey Newcomer M.D.   On: 03/22/2015 10:07   Dg Chest 2 View  03/18/2015   CLINICAL DATA:  Pneumonia.  Initial evaluation .  EXAM: CHEST  2 VIEW  COMPARISON:  None.  FINDINGS: Cardiac pacer with lead tips in right atrium right ventricle. Heart size normal. Pulmonary vascularity is normal. Mild basilar subsegmental atelectasis and/or scarring.  No pleural effusion or pneumothorax.  IMPRESSION: 1. Cardiac pacer with lead tips in right atrium right ventricle. Heart size normal. 2. Bibasilar subsegmental atelectasis and/or scarring.   Electronically Signed   By: Hickman   On: 03/18/2015 14:37   Dg Chest Port 1 View  04/06/2015   CLINICAL DATA:  Congestive heart failure.  Shortness of breath.  EXAM: PORTABLE CHEST - 1 VIEW  COMPARISON:  04/04/2015.  FINDINGS: Stable enlarged cardiac silhouette, left subclavian pacemaker leads and left jugular catheter. No significant change in bilateral lower lung zone airspace opacity and prominence of the pulmonary vasculature. A small left pleural effusion is noted. Lower thoracic spine degenerative changes.  IMPRESSION: 1. Stable cardiomegaly and pulmonary vascular congestion. 2. Stable alveolar edema or pneumonia in both lower lung zones. 3. Small left pleural effusion.   Electronically Signed   By: Claudie Revering M.D.   On: 04/06/2015 07:39   Dg Chest Port 1 View  04/04/2015   CLINICAL DATA:  81 year old male recently extubated.  EXAM: PORTABLE CHEST - 1 VIEW  COMPARISON:  Chest x-ray 04/02/2015.  FINDINGS: Previously noted endotracheal tube has been removed. Left IJ central venous catheter with tip terminating in the mid superior vena cava. Left-sided pacemaker/AICD with leads projecting over the expected location of the right atrium and right ventricular apex. Previously noted transcutaneous defibrillator pads have been removed. Lung volumes are low. Bibasilar opacities favored to reflect predominantly subsegmental atelectasis, although underlying airspace consolidation is not excluded (particularly in the left lower lobe). No definite consolidative airspace disease. No definite pleural effusions. There is cephalization of the pulmonary vasculature and slight indistinctness of the interstitial markings suggestive of mild pulmonary edema. Mild cardiomegaly. Atherosclerosis in the thoracic aorta.  IMPRESSION:  1. Support apparatus, as above. 2. Mild cardiomegaly with mild interstitial pulmonary edema; imaging findings suggestive of mild congestive heart failure. 3. Low lung volumes with probable bibasilar subsegmental atelectasis. 4. Atherosclerosis.   Electronically Signed   By: Vinnie Langton M.D.   On: 04/04/2015 08:46  Dg Chest Port 1 View  04/02/2015   CLINICAL DATA:  Status post central line placement.  EXAM: PORTABLE CHEST - 1 VIEW  COMPARISON:  Single view of the chest earlier today.  FINDINGS: A new left IJ catheter is in place with the tip projecting over the mid to lower superior vena cava. Support apparatus is otherwise unchanged. There is no pneumothorax. The lungs are clear. Heart size is normal.  IMPRESSION: Left IJ catheter tip projects over the mid to lower superior vena cava. Negative for pneumothorax. No other change.   Electronically Signed   By: Inge Rise M.D.   On: 04/02/2015 19:13   Dg Chest Port 1 View  04/02/2015   CLINICAL DATA:  Hypoxia  EXAM: PORTABLE CHEST - 1 VIEW  COMPARISON:  Study obtained earlier in the day  FINDINGS: Endotracheal tube tip is 2.8 cm above the carina. Nasogastric tube tip and side port are in the stomach. Pacemaker leads are attached to the right atrium and right ventricle. No pneumothorax. There is no edema or consolidation. The heart is upper normal in size with pulmonary vascularity within normal limits. No adenopathy. There is atherosclerotic calcification in the aortic arch region.  IMPRESSION: Tube positions as described without pneumothorax. No edema or consolidation. The ill-defined opacity in the right base seen earlier in the day is not appreciable on this examination. Cardiac silhouette within normal limits.   Electronically Signed   By: Lowella Grip III M.D.   On: 04/02/2015 11:05   Dg Chest Port 1 View  04/02/2015   CLINICAL DATA:  Dyspnea, onset tonight.  EXAM: PORTABLE CHEST - 1 VIEW  COMPARISON:  04/01/2015  FINDINGS: There is  developing basilar opacity on the right which could represent infectious infiltrate. No large effusion is evident. Mild interstitial and vascular prominence is present.  IMPRESSION: Developing patchy right base opacity which could represent pneumonia. Noninfectious atelectasis is also possibility.   Electronically Signed   By: Andreas Newport M.D.   On: 04/02/2015 05:23   Dg Chest Port 1 View  03/31/2015   CLINICAL DATA:  Shortness of breath, wheeze and cough for 2 days.  EXAM: PORTABLE CHEST - 1 VIEW  COMPARISON:  03/22/2015  FINDINGS: Chronic cardiomegaly with stable and negative aortic and hilar contours. Dual-chamber pacer leads from the left are in stable position.  Patchy airspace disease at the right base, new. No cavitation or effusion. Background hyperinflation.  IMPRESSION: Right basilar pneumonia.   Electronically Signed   By: Monte Fantasia M.D.   On: 03/31/2015 11:12   Dg Chest Port 1 View  03/21/2015   CLINICAL DATA:  Short of breath.  Initial encounter.  EXAM: PORTABLE CHEST - 1 VIEW  COMPARISON:  03/18/2015.  09/10/2013.  FINDINGS: Cardiopericardial silhouette within normal limits. Mediastinal contours normal. Trachea midline. No airspace disease or effusion. Two lead LEFT subclavian cardiac pacemaker. Monitoring leads project over the chest.  IMPRESSION: No active cardiopulmonary disease.   Electronically Signed   By: Dereck Ligas M.D.   On: 03/21/2015 11:41    Microbiology: Recent Results (from the past 240 hour(s))  MRSA PCR Screening     Status: None   Collection Time: 04/02/15 10:12 AM  Result Value Ref Range Status   MRSA by PCR NEGATIVE NEGATIVE Final    Comment:        The GeneXpert MRSA Assay (FDA approved for NASAL specimens only), is one component of a comprehensive MRSA colonization surveillance program. It is not intended to diagnose  MRSA infection nor to guide or monitor treatment for MRSA infections.      Labs: Basic Metabolic Panel:  Recent  Labs Lab 04/05/15 0458 04/06/15 0530 04/08/15 0450 04/09/15 0130  NA 143 142 140 138  K 3.6 4.4 4.4 4.6  CL 106 103 96* 95*  CO2 31 34* 38* 36*  GLUCOSE 119* 195* 179* 261*  BUN 29* 28* 27* 31*  CREATININE 1.32* 1.26* 1.43* 1.76*  CALCIUM 8.0* 8.0* 8.8* 9.0  MG 2.4  --  2.2  --   PHOS 2.6  --   --   --    Liver Function Tests: No results for input(s): AST, ALT, ALKPHOS, BILITOT, PROT, ALBUMIN in the last 168 hours. No results for input(s): LIPASE, AMYLASE in the last 168 hours. No results for input(s): AMMONIA in the last 168 hours. CBC:  Recent Labs Lab 04/05/15 0458 04/06/15 0530 04/08/15 0450 04/09/15 0130  WBC 12.1* 9.4 7.9 10.8*  NEUTROABS  --   --  6.9  --   HGB 11.2* 11.3* 12.3* 12.2*  HCT 37.0* 37.3* 40.7 39.5  MCV 87.3 87.4 87.2 86.4  PLT 193 198 205 190   Cardiac Enzymes: No results for input(s): CKTOTAL, CKMB, CKMBINDEX, TROPONINI in the last 168 hours. BNP: BNP (last 3 results)  Recent Labs  02/07/15 1300 03/21/15 1055 04/02/15 1015  BNP 131.2* 100.9* 906.6*    ProBNP (last 3 results) No results for input(s): PROBNP in the last 8760 hours.  CBG:  Recent Labs Lab 04/10/15 0802 04/10/15 1205 04/10/15 1631 04/10/15 1952 04/11/15 0825  GLUCAP 232* 235* 243* 233* 266*       Signed:  Dia Crawford, MD [facility] [phone] pager

## 2015-04-13 NOTE — Anesthesia Postprocedure Evaluation (Deleted)
  Anesthesia Post-op Note  Patient: Antonio Ray  Procedure(s) Performed: Procedure(s) (LRB): ESOPHAGOGASTRODUODENOSCOPY (EGD) (N/A) BOTOX INJECTION (N/A)  Patient Location: PACU  Anesthesia Type: MAC  Level of Consciousness: awake and alert   Airway and Oxygen Therapy: Patient Spontanous Breathing  Post-op Pain: mild  Post-op Assessment: Post-op Vital signs reviewed, Patient's Cardiovascular Status Stable, Respiratory Function Stable, Patent Airway and No signs of Nausea or vomiting  Last Vitals:  Filed Vitals:   04/11/15 1500  BP:   Pulse:   Temp: 36.9 C  Resp:     Post-op Vital Signs: stable   Complications: No apparent anesthesia complications

## 2015-04-13 NOTE — Anesthesia Postprocedure Evaluation (Signed)
  Anesthesia Post-op Note  Patient: Antonio Ray  Procedure(s) Performed: Procedure(s) (LRB): ESOPHAGOGASTRODUODENOSCOPY (EGD) (N/A) BOTOX INJECTION (N/A)  Patient Location: PACU  Anesthesia Type: MAC  Level of Consciousness: awake and alert   Airway and Oxygen Therapy: Patient Spontanous Breathing  Post-op Pain: mild  Post-op Assessment: Post-op Vital signs reviewed, Patient's Cardiovascular Status Stable, Respiratory Function Stable, Patent Airway and No signs of Nausea or vomiting  Last Vitals:  Filed Vitals:   04/11/15 1500  BP:   Pulse:   Temp: 36.9 C  Resp:     Post-op Vital Signs: stable   Complications: No apparent anesthesia complications

## 2015-04-14 DIAGNOSIS — N183 Chronic kidney disease, stage 3 (moderate): Secondary | ICD-10-CM | POA: Diagnosis not present

## 2015-04-14 DIAGNOSIS — K219 Gastro-esophageal reflux disease without esophagitis: Secondary | ICD-10-CM | POA: Diagnosis not present

## 2015-04-14 DIAGNOSIS — I2510 Atherosclerotic heart disease of native coronary artery without angina pectoris: Secondary | ICD-10-CM | POA: Diagnosis not present

## 2015-04-14 DIAGNOSIS — I5022 Chronic systolic (congestive) heart failure: Secondary | ICD-10-CM | POA: Diagnosis not present

## 2015-04-14 DIAGNOSIS — I129 Hypertensive chronic kidney disease with stage 1 through stage 4 chronic kidney disease, or unspecified chronic kidney disease: Secondary | ICD-10-CM | POA: Diagnosis not present

## 2015-04-14 DIAGNOSIS — S31104D Unspecified open wound of abdominal wall, left lower quadrant without penetration into peritoneal cavity, subsequent encounter: Secondary | ICD-10-CM | POA: Diagnosis not present

## 2015-04-14 DIAGNOSIS — E1122 Type 2 diabetes mellitus with diabetic chronic kidney disease: Secondary | ICD-10-CM | POA: Diagnosis not present

## 2015-04-14 DIAGNOSIS — J440 Chronic obstructive pulmonary disease with acute lower respiratory infection: Secondary | ICD-10-CM | POA: Diagnosis not present

## 2015-04-14 DIAGNOSIS — J441 Chronic obstructive pulmonary disease with (acute) exacerbation: Secondary | ICD-10-CM | POA: Diagnosis not present

## 2015-04-14 DIAGNOSIS — Z794 Long term (current) use of insulin: Secondary | ICD-10-CM | POA: Diagnosis not present

## 2015-04-14 DIAGNOSIS — Z9981 Dependence on supplemental oxygen: Secondary | ICD-10-CM | POA: Diagnosis not present

## 2015-04-14 DIAGNOSIS — Z95810 Presence of automatic (implantable) cardiac defibrillator: Secondary | ICD-10-CM | POA: Diagnosis not present

## 2015-04-14 DIAGNOSIS — J189 Pneumonia, unspecified organism: Secondary | ICD-10-CM | POA: Diagnosis not present

## 2015-04-14 NOTE — Anesthesia Postprocedure Evaluation (Signed)
  Anesthesia Post-op Note  Patient: Antonio Ray  Procedure(s) Performed: Procedure(s) (LRB): ESOPHAGOGASTRODUODENOSCOPY (EGD) (N/A) BOTOX INJECTION (N/A)  Patient Location: PACU  Anesthesia Type: MAC  Level of Consciousness: awake and alert   Airway and Oxygen Therapy: Patient Spontanous Breathing  Post-op Pain: mild  Post-op Assessment: Post-op Vital signs reviewed, Patient's Cardiovascular Status Stable, Respiratory Function Stable, Patent Airway and No signs of Nausea or vomiting  Last Vitals:  Filed Vitals:   04/11/15 1500  BP:   Pulse:   Temp: 36.9 C  Resp:     Post-op Vital Signs: stable   Complications: No apparent anesthesia complications

## 2015-04-14 NOTE — Addendum Note (Signed)
Addendum  created 04/14/15 1226 by Catalina Gravel, MD   Modules edited: Notes Section   Notes Section:  File: [id number]

## 2015-04-16 DIAGNOSIS — J189 Pneumonia, unspecified organism: Secondary | ICD-10-CM | POA: Diagnosis not present

## 2015-04-16 DIAGNOSIS — S31104D Unspecified open wound of abdominal wall, left lower quadrant without penetration into peritoneal cavity, subsequent encounter: Secondary | ICD-10-CM | POA: Diagnosis not present

## 2015-04-16 DIAGNOSIS — E1122 Type 2 diabetes mellitus with diabetic chronic kidney disease: Secondary | ICD-10-CM | POA: Diagnosis not present

## 2015-04-16 DIAGNOSIS — J440 Chronic obstructive pulmonary disease with acute lower respiratory infection: Secondary | ICD-10-CM | POA: Diagnosis not present

## 2015-04-16 DIAGNOSIS — I5022 Chronic systolic (congestive) heart failure: Secondary | ICD-10-CM | POA: Diagnosis not present

## 2015-04-16 DIAGNOSIS — J441 Chronic obstructive pulmonary disease with (acute) exacerbation: Secondary | ICD-10-CM | POA: Diagnosis not present

## 2015-04-17 ENCOUNTER — Telehealth: Payer: Self-pay | Admitting: Cardiology

## 2015-04-17 DIAGNOSIS — E1122 Type 2 diabetes mellitus with diabetic chronic kidney disease: Secondary | ICD-10-CM | POA: Diagnosis not present

## 2015-04-17 DIAGNOSIS — J441 Chronic obstructive pulmonary disease with (acute) exacerbation: Secondary | ICD-10-CM | POA: Diagnosis not present

## 2015-04-17 DIAGNOSIS — J440 Chronic obstructive pulmonary disease with acute lower respiratory infection: Secondary | ICD-10-CM | POA: Diagnosis not present

## 2015-04-17 DIAGNOSIS — S31104D Unspecified open wound of abdominal wall, left lower quadrant without penetration into peritoneal cavity, subsequent encounter: Secondary | ICD-10-CM | POA: Diagnosis not present

## 2015-04-17 DIAGNOSIS — J189 Pneumonia, unspecified organism: Secondary | ICD-10-CM | POA: Diagnosis not present

## 2015-04-17 DIAGNOSIS — I5022 Chronic systolic (congestive) heart failure: Secondary | ICD-10-CM | POA: Diagnosis not present

## 2015-04-17 NOTE — Telephone Encounter (Signed)
Alan(Son) has a medication question . The home health nurse has made some changes to his medication to his father lasik regiment. Please call  Thanks

## 2015-04-17 NOTE — Telephone Encounter (Signed)
Spoke to son, [name]. He reports patient recently discharged from hospital. Lasix dose was changed by hospitalist w/ instructions for home care to titrate by weight and to hold doses under a certain weight. He doesn't feel comfortable holding med below certain weight ([id number]), because weight was obtained in hospital  and probably not accurate dry weight - pt is 178 lbs now w/ pedal swelling.  Advised to go w/ maintenance dose of lasix (currently 40mg  daily), to take extra 20mg  as needed if swelling worse, continue to monitor BP, HR, weight.  Pt sees Dr. Martinique next week - advised to discuss then and call sooner if changes/concerns.

## 2015-04-20 DIAGNOSIS — S31104D Unspecified open wound of abdominal wall, left lower quadrant without penetration into peritoneal cavity, subsequent encounter: Secondary | ICD-10-CM | POA: Diagnosis not present

## 2015-04-20 DIAGNOSIS — I5022 Chronic systolic (congestive) heart failure: Secondary | ICD-10-CM | POA: Diagnosis not present

## 2015-04-20 DIAGNOSIS — J189 Pneumonia, unspecified organism: Secondary | ICD-10-CM | POA: Diagnosis not present

## 2015-04-20 DIAGNOSIS — E1122 Type 2 diabetes mellitus with diabetic chronic kidney disease: Secondary | ICD-10-CM | POA: Diagnosis not present

## 2015-04-20 DIAGNOSIS — J440 Chronic obstructive pulmonary disease with acute lower respiratory infection: Secondary | ICD-10-CM | POA: Diagnosis not present

## 2015-04-20 DIAGNOSIS — J441 Chronic obstructive pulmonary disease with (acute) exacerbation: Secondary | ICD-10-CM | POA: Diagnosis not present

## 2015-04-21 DIAGNOSIS — Z794 Long term (current) use of insulin: Secondary | ICD-10-CM | POA: Diagnosis not present

## 2015-04-21 DIAGNOSIS — I5022 Chronic systolic (congestive) heart failure: Secondary | ICD-10-CM | POA: Diagnosis not present

## 2015-04-21 DIAGNOSIS — R5381 Other malaise: Secondary | ICD-10-CM | POA: Diagnosis not present

## 2015-04-21 DIAGNOSIS — J690 Pneumonitis due to inhalation of food and vomit: Secondary | ICD-10-CM | POA: Diagnosis not present

## 2015-04-21 DIAGNOSIS — E119 Type 2 diabetes mellitus without complications: Secondary | ICD-10-CM | POA: Diagnosis not present

## 2015-04-22 DIAGNOSIS — J189 Pneumonia, unspecified organism: Secondary | ICD-10-CM | POA: Diagnosis not present

## 2015-04-22 DIAGNOSIS — S31104D Unspecified open wound of abdominal wall, left lower quadrant without penetration into peritoneal cavity, subsequent encounter: Secondary | ICD-10-CM | POA: Diagnosis not present

## 2015-04-22 DIAGNOSIS — I5022 Chronic systolic (congestive) heart failure: Secondary | ICD-10-CM | POA: Diagnosis not present

## 2015-04-22 DIAGNOSIS — J441 Chronic obstructive pulmonary disease with (acute) exacerbation: Secondary | ICD-10-CM | POA: Diagnosis not present

## 2015-04-22 DIAGNOSIS — E1122 Type 2 diabetes mellitus with diabetic chronic kidney disease: Secondary | ICD-10-CM | POA: Diagnosis not present

## 2015-04-22 DIAGNOSIS — J440 Chronic obstructive pulmonary disease with acute lower respiratory infection: Secondary | ICD-10-CM | POA: Diagnosis not present

## 2015-04-23 ENCOUNTER — Ambulatory Visit: Payer: Self-pay | Admitting: Neurology

## 2015-04-24 ENCOUNTER — Ambulatory Visit (INDEPENDENT_AMBULATORY_CARE_PROVIDER_SITE_OTHER): Payer: Medicare Other | Admitting: Cardiology

## 2015-04-24 ENCOUNTER — Encounter: Payer: Self-pay | Admitting: Cardiology

## 2015-04-24 VITALS — BP 112/50 | HR 73 | Ht 70.0 in | Wt 189.1 lb

## 2015-04-24 DIAGNOSIS — J189 Pneumonia, unspecified organism: Secondary | ICD-10-CM | POA: Diagnosis not present

## 2015-04-24 DIAGNOSIS — I5022 Chronic systolic (congestive) heart failure: Secondary | ICD-10-CM | POA: Diagnosis not present

## 2015-04-24 DIAGNOSIS — I5023 Acute on chronic systolic (congestive) heart failure: Secondary | ICD-10-CM

## 2015-04-24 DIAGNOSIS — I255 Ischemic cardiomyopathy: Secondary | ICD-10-CM

## 2015-04-24 DIAGNOSIS — N183 Chronic kidney disease, stage 3 unspecified: Secondary | ICD-10-CM

## 2015-04-24 DIAGNOSIS — J441 Chronic obstructive pulmonary disease with (acute) exacerbation: Secondary | ICD-10-CM | POA: Diagnosis not present

## 2015-04-24 DIAGNOSIS — Z9861 Coronary angioplasty status: Secondary | ICD-10-CM

## 2015-04-24 DIAGNOSIS — Z79899 Other long term (current) drug therapy: Secondary | ICD-10-CM

## 2015-04-24 DIAGNOSIS — I2510 Atherosclerotic heart disease of native coronary artery without angina pectoris: Secondary | ICD-10-CM | POA: Diagnosis not present

## 2015-04-24 DIAGNOSIS — K220 Achalasia of cardia: Secondary | ICD-10-CM

## 2015-04-24 DIAGNOSIS — E1122 Type 2 diabetes mellitus with diabetic chronic kidney disease: Secondary | ICD-10-CM | POA: Diagnosis not present

## 2015-04-24 DIAGNOSIS — Z95810 Presence of automatic (implantable) cardiac defibrillator: Secondary | ICD-10-CM

## 2015-04-24 DIAGNOSIS — J440 Chronic obstructive pulmonary disease with acute lower respiratory infection: Secondary | ICD-10-CM | POA: Diagnosis not present

## 2015-04-24 DIAGNOSIS — S31104D Unspecified open wound of abdominal wall, left lower quadrant without penetration into peritoneal cavity, subsequent encounter: Secondary | ICD-10-CM | POA: Diagnosis not present

## 2015-04-24 LAB — BASIC METABOLIC PANEL
BUN: 22 mg/dL (ref 7–25)
CO2: 30 mmol/L (ref 20–31)
Calcium: 9 mg/dL (ref 8.6–10.3)
Chloride: 104 mmol/L (ref 98–110)
Creat: 1.48 mg/dL — ABNORMAL HIGH (ref 0.70–1.18)
Glucose, Bld: 187 mg/dL — ABNORMAL HIGH (ref 65–99)
Potassium: 3.9 mmol/L (ref 3.5–5.3)
Sodium: 143 mmol/L (ref 135–146)

## 2015-04-24 NOTE — Assessment & Plan Note (Signed)
S/P anterior MI, LAD PCI in 1994, cath in '07 non obstructive CAD, Myoview in 2011 low risk.

## 2015-04-24 NOTE — Assessment & Plan Note (Signed)
[employer] Jude ICD Aug 2012

## 2015-04-24 NOTE — Assessment & Plan Note (Signed)
Recent hospitalization complicated by acute on chronic CHF. Stable today

## 2015-04-24 NOTE — Patient Instructions (Addendum)
Your physician recommends that you return for lab work in: [place]   Sliding scale Lasix: Weigh yourself when you get home, then Daily in the Morning. Your dry weight will be what your scale says on the day you return home.(here is 189 lbs.).   If you gain more than 3 pounds from dry weight: Increase the Lasix dosing to 40 mg in the morning until weight returns to baseline dry weight.  If weight gain is greater than 5 pounds in 2 days: Increased to Lasix 40 mg twice a day and contact the office for further assistance if weight does not go down the next day.  If the weight goes down more than 3 pounds from dry weight: Hold Lasix until it returns to baseline dry weight  Your physician recommends that you schedule a follow-up appointment in: KEEP YOUR APPOINTMENT WITH DR. Martinique 07/06/2015 AT 1:30PM.

## 2015-04-24 NOTE — Assessment & Plan Note (Signed)
EF down to 15% in setting of acute respiratory failure, aspiration pneumonia Aug 2016

## 2015-04-24 NOTE — Assessment & Plan Note (Signed)
SCr 1.76 on 04/08/15

## 2015-04-24 NOTE — Progress Notes (Signed)
04/24/2015 Antonio Ray   28-Feb-1934  [id number]  Primary Physician [name], MD Primary Cardiologist: Dr Martinique  HPI:  81 y/o followed by Dr Martinique with a history of CAD, s/p remote anterior MI, chronic LVD, ICD, and achalasia with recurrent aspiration pneumonia. He was recently admitted for aspiration and respiratory failure. His son found him apneic in his hospital bed on 9/1 and started CPR. He was intubated and self extubated 48 hrs later. His EF in June was 30%, EF by echo 04/02/15- 15%. It was felt he had acute on chronic systolic CHF. He ultimately stabilized and is here with son today for follow up. His son goes with his dad and carries O2 if needed. He says his father's O2 sats have been >91 mostly and he has not needed O2. He was instructed to go home on 40 mg of lasix but the family thought that was too much and cut it back to 20 mg. The pt has 1+ LE edema  But otherwise appears compensated. He did get a Botox injection 04/08/15 for achalasia.     Current Outpatient Prescriptions  Medication Sig Dispense Refill  . aspirin 81 MG EC tablet Take 81 mg by mouth daily.      . budesonide-formoterol (SYMBICORT) 160-4.5 MCG/ACT inhaler Inhale 2 puffs into the lungs 2 (two) times daily. 1 Inhaler 12  . Cholecalciferol (VITAMIN D PO) Take 2,000 Units by mouth daily.     Marland Kitchen donepezil (ARICEPT) 10 MG tablet Take 10 mg by mouth every morning.    . fenofibrate (TRICOR) 145 MG tablet TAKE 1 TABLET BY MOUTH EVERY DAY 30 tablet 6  . furosemide (LASIX) 20 MG tablet Take 2 tablets (40 mg total) by mouth daily. (Patient taking differently: Take 20 mg by mouth daily. ) 90 tablet 2  . insulin glargine (LANTUS) 100 UNIT/ML injection Inject 0.28 mLs (28 Units total) into the skin every morning. (Patient taking differently: Inject 18 Units into the skin every morning. ) 10 mL 11  . memantine (NAMENDA) 10 MG tablet Take 10 mg by mouth 2 (two) times daily.    . Multiple Vitamins-Minerals (PRESERVISION  AREDS 2) CAPS Take 1 capsule by mouth 2 (two) times daily.    . nitroGLYCERIN (NITRODUR - DOSED IN MG/24 HR) 0.2 mg/hr patch Place 0.2 mg onto the skin daily.    . pantoprazole (PROTONIX) 20 MG tablet TAKE 1 TABLET BY MOUTH EVERY DAY 90 tablet 0  . pravastatin (PRAVACHOL) 40 MG tablet TAKE 1 TABLET BY MOUTH EVERY EVENING 90 tablet 0  . PROVENTIL HFA 108 (90 BASE) MCG/ACT inhaler Inhale 2 puffs into the lungs every 6 (six) hours as needed for wheezing or shortness of breath. As directed    . tiotropium (SPIRIVA) 18 MCG inhalation capsule Place 1 capsule (18 mcg total) into inhaler and inhale daily. 30 capsule 1   No current facility-administered medications for this visit.    Allergies  Allergen Reactions  . Codeine Other (See Comments)    Gets very angry, disoriented  . Dilaudid [Hydromorphone Hcl] Other (See Comments)    VERY AGITATED, HOSTILE  . Flomax [Tamsulosin Hcl] Shortness Of Breath  . Morphine And Related Other (See Comments)    VERY AGITATED, HOSTILE  . Sulfa Antibiotics Shortness Of Breath  . Beta Adrenergic Blockers     ? disorientation  . Carvedilol Other (See Comments)    DISORIENTATION  . Levofloxacin Other (See Comments)    unknown    Social History  Social History  . Marital Status: Widowed    Spouse Name: N/A  . Number of Children: 2  . Years of Education: GED   Occupational History  . 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 Smoking status: Former Smoker -- 1.00 packs/day for 35 years    Types: Cigarettes    Quit date: 08/01/1992  . Smokeless tobacco: Never Used  . Alcohol Use: 0.0 oz/week    0 Standard drinks or equivalent per week     Comment: Approximately a beer every 2 years  . Drug Use: No  . Sexual Activity: Not Currently   Other Topics Concern  . Not on file   Social History Narrative   Pt lives in [place] alone.  Widowed.   Right-handed.   Rare caffeine  use - maybe one cup per month.     Review of Systems: General: negative for chills, fever, night sweats or weight changes.  Cardiovascular: negative for chest pain, dyspnea on exertion, edema, orthopnea, palpitations, paroxysmal nocturnal dyspnea or shortness of breath Dermatological: negative for rash Respiratory: negative for cough or wheezing Urologic: negative for hematuria Abdominal: negative for nausea, vomiting, diarrhea, bright red blood per rectum, melena, or hematemesis Neurologic: negative for visual changes, syncope, or dizziness All other systems reviewed and are otherwise negative except as noted above.    Blood pressure 112/50, pulse 73, height 5\' 10"  (1.778 m), weight 189 lb 1.6 oz (85.775 kg).  General appearance: alert, cooperative and no distress Neck: no carotid bruit and no JVD Lungs: clear to auscultation bilaterally Heart: regular rate and rhythm Extremities: 1+ pretibial pitting edema Skin: Skin color, texture, turgor normal. No rashes or lesions Neurologic: Grossly normal   ASSESSMENT AND PLAN:   Acute on chronic systolic CHF (congestive heart failure) Recent hospitalization complicated by acute on chronic CHF. Stable today  Cardiomyopathy, ischemic EF down to 15% in setting of acute respiratory failure, aspiration pneumonia Aug 2016  CAD S/P percutaneous coronary angioplasty S/P anterior MI, LAD PCI in 1994, cath in '07 non obstructive CAD, Myoview in 2011 low risk.  S/P ICD (internal cardiac defibrillator) procedure St Jude ICD Aug 2012  Achalasia With recurrent aspiration pneumonia. S/P Botox injection 04/08/15  CKD (chronic kidney disease), stage III SCr 1.76 on 04/08/15   PLAN  I gave the son sliding scale Lasix instructions. I ordered a BMP today, (he is on Losartan 25 mg and his last SCr was higher than his baseline). He has an apt with Dr Martinique in Dec and will keep that.  I told the son we would consider checking another echo in 3 months but  it most likely won't change treatment, only prognosis.    Kerin Ransom K PA-C 04/24/2015 12:20 PM

## 2015-04-24 NOTE — Assessment & Plan Note (Signed)
With recurrent aspiration pneumonia. S/P Botox injection 04/08/15

## 2015-04-28 DIAGNOSIS — S31104D Unspecified open wound of abdominal wall, left lower quadrant without penetration into peritoneal cavity, subsequent encounter: Secondary | ICD-10-CM | POA: Diagnosis not present

## 2015-04-28 DIAGNOSIS — J441 Chronic obstructive pulmonary disease with (acute) exacerbation: Secondary | ICD-10-CM | POA: Diagnosis not present

## 2015-04-28 DIAGNOSIS — E1122 Type 2 diabetes mellitus with diabetic chronic kidney disease: Secondary | ICD-10-CM | POA: Diagnosis not present

## 2015-04-28 DIAGNOSIS — J189 Pneumonia, unspecified organism: Secondary | ICD-10-CM | POA: Diagnosis not present

## 2015-04-28 DIAGNOSIS — J440 Chronic obstructive pulmonary disease with acute lower respiratory infection: Secondary | ICD-10-CM | POA: Diagnosis not present

## 2015-04-28 DIAGNOSIS — I5022 Chronic systolic (congestive) heart failure: Secondary | ICD-10-CM | POA: Diagnosis not present

## 2015-04-30 DIAGNOSIS — J440 Chronic obstructive pulmonary disease with acute lower respiratory infection: Secondary | ICD-10-CM | POA: Diagnosis not present

## 2015-04-30 DIAGNOSIS — E1122 Type 2 diabetes mellitus with diabetic chronic kidney disease: Secondary | ICD-10-CM | POA: Diagnosis not present

## 2015-04-30 DIAGNOSIS — I5022 Chronic systolic (congestive) heart failure: Secondary | ICD-10-CM | POA: Diagnosis not present

## 2015-04-30 DIAGNOSIS — J189 Pneumonia, unspecified organism: Secondary | ICD-10-CM | POA: Diagnosis not present

## 2015-04-30 DIAGNOSIS — J441 Chronic obstructive pulmonary disease with (acute) exacerbation: Secondary | ICD-10-CM | POA: Diagnosis not present

## 2015-04-30 DIAGNOSIS — S31104D Unspecified open wound of abdominal wall, left lower quadrant without penetration into peritoneal cavity, subsequent encounter: Secondary | ICD-10-CM | POA: Diagnosis not present

## 2015-05-01 DIAGNOSIS — J189 Pneumonia, unspecified organism: Secondary | ICD-10-CM | POA: Diagnosis not present

## 2015-05-01 DIAGNOSIS — E1122 Type 2 diabetes mellitus with diabetic chronic kidney disease: Secondary | ICD-10-CM | POA: Diagnosis not present

## 2015-05-01 DIAGNOSIS — I5022 Chronic systolic (congestive) heart failure: Secondary | ICD-10-CM | POA: Diagnosis not present

## 2015-05-01 DIAGNOSIS — J440 Chronic obstructive pulmonary disease with acute lower respiratory infection: Secondary | ICD-10-CM | POA: Diagnosis not present

## 2015-05-01 DIAGNOSIS — J441 Chronic obstructive pulmonary disease with (acute) exacerbation: Secondary | ICD-10-CM | POA: Diagnosis not present

## 2015-05-01 DIAGNOSIS — S31104D Unspecified open wound of abdominal wall, left lower quadrant without penetration into peritoneal cavity, subsequent encounter: Secondary | ICD-10-CM | POA: Diagnosis not present

## 2015-05-04 DIAGNOSIS — J449 Chronic obstructive pulmonary disease, unspecified: Secondary | ICD-10-CM | POA: Diagnosis not present

## 2015-05-04 DIAGNOSIS — I5022 Chronic systolic (congestive) heart failure: Secondary | ICD-10-CM | POA: Diagnosis not present

## 2015-05-04 DIAGNOSIS — J690 Pneumonitis due to inhalation of food and vomit: Secondary | ICD-10-CM | POA: Diagnosis not present

## 2015-05-06 DIAGNOSIS — E1122 Type 2 diabetes mellitus with diabetic chronic kidney disease: Secondary | ICD-10-CM | POA: Diagnosis not present

## 2015-05-06 DIAGNOSIS — J441 Chronic obstructive pulmonary disease with (acute) exacerbation: Secondary | ICD-10-CM | POA: Diagnosis not present

## 2015-05-06 DIAGNOSIS — S31104D Unspecified open wound of abdominal wall, left lower quadrant without penetration into peritoneal cavity, subsequent encounter: Secondary | ICD-10-CM | POA: Diagnosis not present

## 2015-05-06 DIAGNOSIS — J440 Chronic obstructive pulmonary disease with acute lower respiratory infection: Secondary | ICD-10-CM | POA: Diagnosis not present

## 2015-05-06 DIAGNOSIS — I5022 Chronic systolic (congestive) heart failure: Secondary | ICD-10-CM | POA: Diagnosis not present

## 2015-05-06 DIAGNOSIS — J189 Pneumonia, unspecified organism: Secondary | ICD-10-CM | POA: Diagnosis not present

## 2015-05-08 ENCOUNTER — Other Ambulatory Visit: Payer: Self-pay | Admitting: Gastroenterology

## 2015-05-08 ENCOUNTER — Other Ambulatory Visit: Payer: Self-pay

## 2015-05-08 ENCOUNTER — Other Ambulatory Visit: Payer: Self-pay | Admitting: Internal Medicine

## 2015-05-08 MED ORDER — NITROGLYCERIN 0.2 MG/HR TD PT24: 0.2000 mg | MEDICATED_PATCH | Freq: Every day | TRANSDERMAL | Status: DC

## 2015-05-12 ENCOUNTER — Inpatient Hospital Stay (HOSPITAL_COMMUNITY)
Admission: EM | Admit: 2015-05-12 | Discharge: 2015-05-15 | DRG: 871 | Disposition: A | Discharge status: Home / Self Care | Payer: Medicare Other | Attending: Family Medicine | Admitting: Family Medicine

## 2015-05-12 ENCOUNTER — Emergency Department (HOSPITAL_COMMUNITY): Payer: Medicare Other

## 2015-05-12 ENCOUNTER — Encounter (HOSPITAL_COMMUNITY): Payer: Self-pay | Admitting: Emergency Medicine

## 2015-05-12 DIAGNOSIS — Z794 Long term (current) use of insulin: Secondary | ICD-10-CM | POA: Diagnosis not present

## 2015-05-12 DIAGNOSIS — N179 Acute kidney failure, unspecified: Secondary | ICD-10-CM | POA: Diagnosis not present

## 2015-05-12 DIAGNOSIS — I252 Old myocardial infarction: Secondary | ICD-10-CM | POA: Diagnosis not present

## 2015-05-12 DIAGNOSIS — A4189 Other specified sepsis: Secondary | ICD-10-CM | POA: Diagnosis not present

## 2015-05-12 DIAGNOSIS — I255 Ischemic cardiomyopathy: Secondary | ICD-10-CM | POA: Diagnosis present

## 2015-05-12 DIAGNOSIS — J189 Pneumonia, unspecified organism: Secondary | ICD-10-CM | POA: Diagnosis not present

## 2015-05-12 DIAGNOSIS — R1310 Dysphagia, unspecified: Secondary | ICD-10-CM | POA: Insufficient documentation

## 2015-05-12 DIAGNOSIS — E785 Hyperlipidemia, unspecified: Secondary | ICD-10-CM | POA: Diagnosis present

## 2015-05-12 DIAGNOSIS — I2510 Atherosclerotic heart disease of native coronary artery without angina pectoris: Secondary | ICD-10-CM | POA: Diagnosis not present

## 2015-05-12 DIAGNOSIS — Z7982 Long term (current) use of aspirin: Secondary | ICD-10-CM

## 2015-05-12 DIAGNOSIS — I25119 Atherosclerotic heart disease of native coronary artery with unspecified angina pectoris: Secondary | ICD-10-CM | POA: Diagnosis present

## 2015-05-12 DIAGNOSIS — Y95 Nosocomial condition: Secondary | ICD-10-CM | POA: Diagnosis present

## 2015-05-12 DIAGNOSIS — Z9861 Coronary angioplasty status: Secondary | ICD-10-CM | POA: Diagnosis not present

## 2015-05-12 DIAGNOSIS — K219 Gastro-esophageal reflux disease without esophagitis: Secondary | ICD-10-CM | POA: Diagnosis present

## 2015-05-12 DIAGNOSIS — N183 Chronic kidney disease, stage 3 unspecified: Secondary | ICD-10-CM | POA: Insufficient documentation

## 2015-05-12 DIAGNOSIS — J440 Chronic obstructive pulmonary disease with acute lower respiratory infection: Secondary | ICD-10-CM | POA: Diagnosis present

## 2015-05-12 DIAGNOSIS — Z79899 Other long term (current) drug therapy: Secondary | ICD-10-CM

## 2015-05-12 DIAGNOSIS — K220 Achalasia of cardia: Secondary | ICD-10-CM | POA: Diagnosis present

## 2015-05-12 DIAGNOSIS — Z87891 Personal history of nicotine dependence: Secondary | ICD-10-CM | POA: Diagnosis not present

## 2015-05-12 DIAGNOSIS — I5032 Chronic diastolic (congestive) heart failure: Secondary | ICD-10-CM | POA: Diagnosis not present

## 2015-05-12 DIAGNOSIS — I5022 Chronic systolic (congestive) heart failure: Secondary | ICD-10-CM | POA: Diagnosis present

## 2015-05-12 DIAGNOSIS — J449 Chronic obstructive pulmonary disease, unspecified: Secondary | ICD-10-CM | POA: Diagnosis present

## 2015-05-12 DIAGNOSIS — Z95810 Presence of automatic (implantable) cardiac defibrillator: Secondary | ICD-10-CM | POA: Diagnosis not present

## 2015-05-12 DIAGNOSIS — A419 Sepsis, unspecified organism: Principal | ICD-10-CM

## 2015-05-12 DIAGNOSIS — E1122 Type 2 diabetes mellitus with diabetic chronic kidney disease: Secondary | ICD-10-CM | POA: Diagnosis present

## 2015-05-12 DIAGNOSIS — R069 Unspecified abnormalities of breathing: Secondary | ICD-10-CM | POA: Diagnosis not present

## 2015-05-12 DIAGNOSIS — R0602 Shortness of breath: Secondary | ICD-10-CM | POA: Diagnosis not present

## 2015-05-12 DIAGNOSIS — Z7951 Long term (current) use of inhaled steroids: Secondary | ICD-10-CM

## 2015-05-12 DIAGNOSIS — E118 Type 2 diabetes mellitus with unspecified complications: Secondary | ICD-10-CM

## 2015-05-12 DIAGNOSIS — Z85828 Personal history of other malignant neoplasm of skin: Secondary | ICD-10-CM | POA: Diagnosis not present

## 2015-05-12 DIAGNOSIS — R509 Fever, unspecified: Secondary | ICD-10-CM | POA: Diagnosis not present

## 2015-05-12 DIAGNOSIS — R1319 Other dysphagia: Secondary | ICD-10-CM | POA: Diagnosis present

## 2015-05-12 LAB — URINALYSIS, ROUTINE W REFLEX MICROSCOPIC
Bilirubin Urine: NEGATIVE
GLUCOSE, UA: NEGATIVE mg/dL
Hgb urine dipstick: NEGATIVE
Ketones, ur: NEGATIVE mg/dL
LEUKOCYTES UA: NEGATIVE
NITRITE: NEGATIVE
PROTEIN: NEGATIVE mg/dL
Specific Gravity, Urine: 1.02 (ref 1.005–1.030)
Urobilinogen, UA: 0.2 mg/dL (ref 0.0–1.0)
pH: 6 (ref 5.0–8.0)

## 2015-05-12 LAB — CBC WITH DIFFERENTIAL/PLATELET
Basophils Absolute: 0 10*3/uL (ref 0.0–0.1)
Basophils Relative: 0 %
EOS ABS: 0 10*3/uL (ref 0.0–0.7)
EOS PCT: 0 %
HCT: 40.1 % (ref 39.0–52.0)
HEMOGLOBIN: 12.2 g/dL — AB (ref 13.0–17.0)
LYMPHS ABS: 1 10*3/uL (ref 0.7–4.0)
LYMPHS PCT: 5 %
MCH: 26.5 pg (ref 26.0–34.0)
MCHC: 30.4 g/dL (ref 30.0–36.0)
MCV: 87.2 fL (ref 78.0–100.0)
MONOS PCT: 5 %
Monocytes Absolute: 1.2 10*3/uL — ABNORMAL HIGH (ref 0.1–1.0)
NEUTROS PCT: 90 %
Neutro Abs: 19.3 10*3/uL — ABNORMAL HIGH (ref 1.7–7.7)
Platelets: 244 10*3/uL (ref 150–400)
RBC: 4.6 MIL/uL (ref 4.22–5.81)
RDW: 15.7 % — ABNORMAL HIGH (ref 11.5–15.5)
WBC: 21.5 10*3/uL — AB (ref 4.0–10.5)

## 2015-05-12 LAB — COMPREHENSIVE METABOLIC PANEL
ALT: 20 U/L (ref 17–63)
AST: 29 U/L (ref 15–41)
Albumin: 3.4 g/dL — ABNORMAL LOW (ref 3.5–5.0)
Alkaline Phosphatase: 37 U/L — ABNORMAL LOW (ref 38–126)
Anion gap: 12 (ref 5–15)
BUN: 41 mg/dL — AB (ref 6–20)
CHLORIDE: 103 mmol/L (ref 101–111)
CO2: 26 mmol/L (ref 22–32)
CREATININE: 1.78 mg/dL — AB (ref 0.61–1.24)
Calcium: 9.3 mg/dL (ref 8.9–10.3)
GFR calc Af Amer: 40 mL/min — ABNORMAL LOW (ref >60)
GFR calc non Af Amer: 35 mL/min — ABNORMAL LOW (ref >60)
Glucose, Bld: 174 mg/dL — ABNORMAL HIGH (ref 65–99)
POTASSIUM: 4.2 mmol/L (ref 3.5–5.1)
SODIUM: 141 mmol/L (ref 135–145)
Total Bilirubin: 0.5 mg/dL (ref 0.3–1.2)
Total Protein: 6.5 g/dL (ref 6.5–8.1)

## 2015-05-12 LAB — GLUCOSE, CAPILLARY
GLUCOSE-CAPILLARY: 137 mg/dL — AB (ref 65–99)
Glucose-Capillary: 183 mg/dL — ABNORMAL HIGH (ref 65–99)

## 2015-05-12 LAB — BRAIN NATRIURETIC PEPTIDE: B NATRIURETIC PEPTIDE 5: 136.5 pg/mL — AB (ref 0.0–100.0)

## 2015-05-12 LAB — I-STAT CG4 LACTIC ACID, ED
LACTIC ACID, VENOUS: 1.67 mmol/L (ref 0.5–2.0)
Lactic Acid, Venous: 1.64 mmol/L (ref 0.5–2.0)

## 2015-05-12 LAB — TROPONIN I: Troponin I: 0.04 ng/mL — ABNORMAL HIGH (ref <0.031)

## 2015-05-12 LAB — I-STAT TROPONIN, ED: TROPONIN I, POC: 0.04 ng/mL (ref 0.00–0.08)

## 2015-05-12 MED ORDER — TIOTROPIUM BROMIDE MONOHYDRATE 18 MCG IN CAPS
18.0000 ug | ORAL_CAPSULE | Freq: Every day | RESPIRATORY_TRACT | Status: DC
  Administered 2015-05-13 – 2015-05-15 (×2): 18 ug via RESPIRATORY_TRACT
  Filled 2015-05-12 (×2): qty 5

## 2015-05-12 MED ORDER — LEVOFLOXACIN IN D5W 750 MG/150ML IV SOLN
750.0000 mg | INTRAVENOUS | Status: DC
  Filled 2015-05-12: qty 150

## 2015-05-12 MED ORDER — ALBUTEROL SULFATE (2.5 MG/3ML) 0.083% IN NEBU: 2.5000 mg | INHALATION_SOLUTION | RESPIRATORY_TRACT | Status: DC | PRN

## 2015-05-12 MED ORDER — BUDESONIDE-FORMOTEROL FUMARATE 160-4.5 MCG/ACT IN AERO
2.0000 | INHALATION_SPRAY | Freq: Two times a day (BID) | RESPIRATORY_TRACT | Status: DC
  Administered 2015-05-13 – 2015-05-15 (×3): 2 via RESPIRATORY_TRACT
  Filled 2015-05-12 (×3): qty 6

## 2015-05-12 MED ORDER — VITAMIN D 1000 UNITS PO TABS
2000.0000 [IU] | ORAL_TABLET | Freq: Every day | ORAL | Status: DC
  Administered 2015-05-12 – 2015-05-15 (×4): 2000 [IU] via ORAL
  Filled 2015-05-12 (×4): qty 2

## 2015-05-12 MED ORDER — VANCOMYCIN HCL 10 G IV SOLR
1750.0000 mg | Freq: Once | INTRAVENOUS | Status: AC
  Administered 2015-05-12: 1750 mg via INTRAVENOUS
  Filled 2015-05-12: qty 1750

## 2015-05-12 MED ORDER — INSULIN GLARGINE 100 UNIT/ML ~~LOC~~ SOLN
10.0000 [IU] | Freq: Every morning | SUBCUTANEOUS | Status: DC
  Administered 2015-05-12 – 2015-05-15 (×4): 10 [IU] via SUBCUTANEOUS
  Filled 2015-05-12 (×4): qty 0.1

## 2015-05-12 MED ORDER — VANCOMYCIN HCL IN DEXTROSE 1-5 GM/200ML-% IV SOLN: 1000.0000 mg | Freq: Once | INTRAVENOUS | Status: DC

## 2015-05-12 MED ORDER — ENOXAPARIN SODIUM 40 MG/0.4ML ~~LOC~~ SOLN
40.0000 mg | SUBCUTANEOUS | Status: DC
  Administered 2015-05-12 – 2015-05-15 (×4): 40 mg via SUBCUTANEOUS
  Filled 2015-05-12 (×4): qty 0.4

## 2015-05-12 MED ORDER — INSULIN ASPART 100 UNIT/ML ~~LOC~~ SOLN
0.0000 [IU] | Freq: Three times a day (TID) | SUBCUTANEOUS | Status: DC
  Administered 2015-05-12 – 2015-05-13 (×2): 1 [IU] via SUBCUTANEOUS
  Administered 2015-05-13: 2 [IU] via SUBCUTANEOUS
  Administered 2015-05-14: 1 [IU] via SUBCUTANEOUS
  Administered 2015-05-14: 3 [IU] via SUBCUTANEOUS
  Administered 2015-05-14: 2 [IU] via SUBCUTANEOUS
  Administered 2015-05-15: 1 [IU] via SUBCUTANEOUS
  Administered 2015-05-15 (×2): 2 [IU] via SUBCUTANEOUS

## 2015-05-12 MED ORDER — INSULIN ASPART 100 UNIT/ML ~~LOC~~ SOLN
0.0000 [IU] | Freq: Every day | SUBCUTANEOUS | Status: DC
  Administered 2015-05-14: 2 [IU] via SUBCUTANEOUS

## 2015-05-12 MED ORDER — IPRATROPIUM-ALBUTEROL 0.5-2.5 (3) MG/3ML IN SOLN
3.0000 mL | Freq: Once | RESPIRATORY_TRACT | Status: AC
  Administered 2015-05-12: 3 mL via RESPIRATORY_TRACT
  Filled 2015-05-12: qty 3

## 2015-05-12 MED ORDER — SODIUM CHLORIDE 0.9 % IV BOLUS (SEPSIS)
1000.0000 mL | INTRAVENOUS | Status: AC
  Administered 2015-05-12 (×2): 1000 mL via INTRAVENOUS

## 2015-05-12 MED ORDER — PRAVASTATIN SODIUM 40 MG PO TABS
40.0000 mg | ORAL_TABLET | Freq: Every evening | ORAL | Status: DC
  Administered 2015-05-12 – 2015-05-15 (×4): 40 mg via ORAL
  Filled 2015-05-12 (×4): qty 1

## 2015-05-12 MED ORDER — MEMANTINE HCL 10 MG PO TABS
10.0000 mg | ORAL_TABLET | Freq: Two times a day (BID) | ORAL | Status: DC
  Administered 2015-05-12 – 2015-05-15 (×6): 10 mg via ORAL
  Filled 2015-05-12 (×8): qty 1

## 2015-05-12 MED ORDER — PIPERACILLIN-TAZOBACTAM 3.375 G IVPB 30 MIN
3.3750 g | Freq: Once | INTRAVENOUS | Status: AC
  Administered 2015-05-12: 3.375 g via INTRAVENOUS
  Filled 2015-05-12: qty 50

## 2015-05-12 MED ORDER — VANCOMYCIN HCL IN DEXTROSE 1-5 GM/200ML-% IV SOLN
1000.0000 mg | INTRAVENOUS | Status: DC
  Administered 2015-05-13 – 2015-05-14 (×2): 1000 mg via INTRAVENOUS
  Filled 2015-05-12 (×2): qty 200

## 2015-05-12 MED ORDER — PIPERACILLIN-TAZOBACTAM 3.375 G IVPB 30 MIN: 3.3750 g | Freq: Three times a day (TID) | INTRAVENOUS | Status: DC

## 2015-05-12 MED ORDER — ASPIRIN EC 81 MG PO TBEC
81.0000 mg | DELAYED_RELEASE_TABLET | Freq: Every day | ORAL | Status: DC
  Administered 2015-05-12 – 2015-05-15 (×4): 81 mg via ORAL
  Filled 2015-05-12 (×4): qty 1

## 2015-05-12 MED ORDER — ALBUTEROL SULFATE (2.5 MG/3ML) 0.083% IN NEBU: 2.5000 mg | INHALATION_SOLUTION | Freq: Four times a day (QID) | RESPIRATORY_TRACT | Status: DC | PRN

## 2015-05-12 MED ORDER — NITROGLYCERIN 0.2 MG/HR TD PT24
0.2000 mg | MEDICATED_PATCH | Freq: Every day | TRANSDERMAL | Status: DC
  Administered 2015-05-13 – 2015-05-15 (×3): 0.2 mg via TRANSDERMAL
  Filled 2015-05-12 (×4): qty 1

## 2015-05-12 MED ORDER — FENOFIBRATE 160 MG PO TABS
160.0000 mg | ORAL_TABLET | Freq: Every day | ORAL | Status: DC
Start: 2015-05-12 — End: 2015-05-15
  Administered 2015-05-12 – 2015-05-15 (×4): 160 mg via ORAL
  Filled 2015-05-12 (×4): qty 1

## 2015-05-12 MED ORDER — PIPERACILLIN-TAZOBACTAM 3.375 G IVPB
3.3750 g | Freq: Three times a day (TID) | INTRAVENOUS | Status: DC
  Administered 2015-05-12 – 2015-05-13 (×5): 3.375 g via INTRAVENOUS
  Filled 2015-05-12 (×9): qty 50

## 2015-05-12 MED ORDER — SODIUM CHLORIDE 0.9 % IJ SOLN
3.0000 mL | Freq: Two times a day (BID) | INTRAMUSCULAR | Status: DC
Start: 2015-05-12 — End: 2015-05-15
  Administered 2015-05-12 – 2015-05-15 (×6): 3 mL via INTRAVENOUS

## 2015-05-12 MED ORDER — PANTOPRAZOLE SODIUM 20 MG PO TBEC
20.0000 mg | DELAYED_RELEASE_TABLET | Freq: Every day | ORAL | Status: DC
  Administered 2015-05-12 – 2015-05-15 (×4): 20 mg via ORAL
  Filled 2015-05-12 (×4): qty 1

## 2015-05-12 MED ORDER — FUROSEMIDE 20 MG PO TABS
20.0000 mg | ORAL_TABLET | Freq: Two times a day (BID) | ORAL | Status: DC
  Administered 2015-05-12 – 2015-05-13 (×2): 20 mg via ORAL
  Filled 2015-05-12 (×4): qty 1

## 2015-05-12 MED ORDER — DONEPEZIL HCL 10 MG PO TABS
10.0000 mg | ORAL_TABLET | Freq: Every morning | ORAL | Status: DC
  Administered 2015-05-12 – 2015-05-15 (×4): 10 mg via ORAL
  Filled 2015-05-12 (×4): qty 1

## 2015-05-12 NOTE — ED Notes (Addendum)
Pt brought via EMS from home with c/o shortness of breath. Per EMS, pt with a temp of 102 tympanic, given 1,000 mg tylenol. Stated he has been treated for pneumonia x5 since June 2016. Pt has a demand internal pacemaker, hx MI, and mild dementia. Vitals en route, BP- 122/70, P- 114. 97% O2 on 2L. Allergy to codeine and diladid

## 2015-05-12 NOTE — ED Notes (Signed)
Attempted to call report, no answer

## 2015-05-12 NOTE — Progress Notes (Signed)
Family Medicine Teaching Service Daily Progress Note Intern Pager: [phone]  Patient name: Antonio Ray Medical record number: [id number] Date of birth: October 10, 1933 Age: 81 y.o. Gender: male  Primary Care Provider: [name], MD Consultants: None  Code Status: Full  Pt Overview and Major Events to Date:   Assessment and Plan: QUAMIR WILLEMSEN is a 81 y.o. male presenting with dyspnea. PMH is significant for COPD, Multiple episodes of PNA, MI, HFrEF s/p AICD, TDM2, CAD  Pneumonia:  Patient feels good, denies any complaints this AM. Vitals Pulse ox 91% on room air, RR 22, pulses 80 BP 111/58. physical exam: noted to have slight left lower lobe crackles on exam.  Hx of recurrent pneumonia 5 since June 2016. CXR Right lower lobe infiltrate consistent with pneumonia.  Leukocytosis of 21.5 - Continue Vanc/Zosyn for pneumonia - F/u blood/urine cultures - WBC 21.5 > 16.8  - Continue to monitor vitals    Achalasia  -S/P Botox injection 04/08/15 -Follow-up with Dr. Zenovia Jarred (GI) on 06/11/2015  - Follow up swallow study   CAD: PCI to LAD 2006. follow-up cardiac cath 2007 with no intervention required. Myoview in 2011 low risk. No stents placed. Troponin 0.04 in ED with no ACS symptoms - Troponin 0.04> 0.03>0.03 - Morning EKG this AM unchanged   - continue nitroglycerin patch daily - continue pravastatin and fenofibrate  HFrEF/AICD: last EF of 10-15% in September 2016. Lower extremity Edema 2+ to mid-calves, slightly SOB. Pent has an AICD in place. Given 2L NS in ED since he arrived in code sepsis, so may need to be watchful of fluid overload - Discharge weight from last admission 83.6 kg (184 pounds)--> Currently 194lbs - Increased Lasix to 40mg  BID from 20 mg BID  - telemetry  AKI on CKD3: Scr 1.7, Baseline Scr -1.5. Patient not likely hypovolemic - Will continue to follow   Diabetes type 2: on insulin - decrease to Lantus 10u daily - sensitive SSI with qHS   GERD - continue  protonix  Cognitive impairment - continue home Namenda and Aricept  FEN/GI: Carb/heart healthy Prophylaxis: Lovenox  Disposition: transfer to telemetry   Subjective:  Patient states that he is doing well this morning. Slight SOB, but feels great overall.   Objective: Temp:  [97.5 F (36.4 C)-99.7 F (37.6 C)] 98.4 F (36.9 C) (10/12 0448) Pulse Rate:  [61-109] 61 (10/12 0400) Resp:  [18-32] 32 (10/12 0400) BP: (94-138)/(42-60) 138/60 mmHg (10/12 0400) SpO2:  [91 %-100 %] 97 % (10/12 0400) Weight:  [194 lb 7.1 oz (88.2 kg)] 194 lb 7.1 oz (88.2 kg) (10/11 1424) Physical Exam: General: Sitting in Chair, NAD Cardiovascular: RRR, no murmurs Respiratory: left lower lobe crackles,  Abdomen: BS+, non-tender, non-distended  Extremities:  2+ pitting edema to mid-calves   Laboratory:  Recent Labs Lab 05/12/15 0853 05/13/15 0400  WBC 21.5* 16.8*  HGB 12.2* 10.4*  HCT 40.1 34.4*  PLT 244 191    Recent Labs Lab 05/12/15 0853 05/13/15 0400  NA 141 139  K 4.2 4.3  CL 103 104  CO2 26 29  BUN 41* 32*  CREATININE 1.78* 1.70*  CALCIUM 9.3 8.3*  PROT 6.5  --   BILITOT 0.5  --   ALKPHOS 37*  --   ALT 20  --   AST 29  --   GLUCOSE 174* 115*    Imaging/Diagnostic Tests: Dg Chest 2 View  05/12/2015  CLINICAL DATA:  Short of breath.  Fever. EXAM: CHEST  2 VIEW COMPARISON:  04/06/2015 FINDINGS: New area of infiltrate in the right lower lobe, probable pneumonia. Interval clearing of bibasilar atelectasis and left effusion since prior study, these findings were felt to be due to heart failure at that time. No heart failure on today's study. Dual lead pacemaker noted. COPD with hyperinflation. IMPRESSION: Right lower lobe infiltrate consistent with pneumonia Clearing of heart failure with bibasilar atelectasis compared with the prior study. Electronically Signed   By: Franchot Gallo M.D.   On: 05/12/2015 10:08    Kang Ishida Cletis Media, MD 05/13/2015, 7:16 AM PGY-1, [place] Intern pager: [phone], text pages welcome

## 2015-05-12 NOTE — ED Notes (Signed)
Pt SBPs continue to be in the 90s; Abigail Butts, PA notified

## 2015-05-12 NOTE — H&P (Signed)
[facility] Admission History and Physical Service Pager: [phone]  Patient name: Antonio Ray Medical record number: [id number] Date of birth: Nov 01, 1933 Age: 81 y.o. Gender: male  Primary Care Provider: [name], MD Consultants: None Code Status: Full Code  Chief Complaint: Dyspnea  Assessment and Plan: TAHJAE CLAUSING is a 81 y.o. male presenting with dyspnea. PMH is significant for COPD, Multiple episodes of PNA, MI, HFrEF s/p AICD, DM2  Pneumonia: patient with recurrent infections and admissions. Last admission required intubation. Previously thought to be secondary to aspiration with patient's history of achalasia. COPD probably playing a significant role in recurrent infections as well. Patient has been doing well since discharge about one month ago. Presentation much improved from initial presentation in ED to initial exam on admission. Patient maintained on 2L [place]. No associated chest pain. Leukocytosis of 21.5 - Admit to inpatient, Dr. Ree Kida is attending physician - Continue Vanc/Zosyn for pneumonia - F/u blood/urine cultures - repeat CBC in AM  Sepsis: appears to have improved from initial presentation. Sepsis protocol already started in ED. BP stable - Continue abx as above - monitor vitals  CAD: history of MI. S/p cath with no stents placed. Troponin 0.04 in ED with no ACS symptoms - cycle troponin x3 - continue nitroglycerin patch daily - continue pravastatin and fenofibrate  HFrEF: last EF of 10-15% in September 2016. Patient currently does not appear fluid overloaded from exam and imaging. BNP elevated but actually much improved from 9/1. Patient has an AICD in place. Given 2L NS in ED since he arrived in code sepsis, so may need to be watchful of fluid overload - continue lasix - telemetry  AKI on CKD3: slight elevation in creatinine. GFR slightly diminished. Patient not likely hypovolemic - repeat bmet in AM  Diabetes type 2:  on insulin - decrease to Lantus 10u daily - sensitive SSI with qHS   GERD - continue protonix  Cognitive impairment - continue home Namenda and Aricept  FEN/GI: Carb/heart healthy Prophylaxis: Lovenox  Disposition: Stepdown unit  History of Present Illness:  Antonio Ray is a 81 y.o. male presenting with dyspnea. This morning around 2am, he reports having a O2 down to 60%. He gave himself a breathing treatment which helped. When his daughter came, his O2 was 89% and he was shivering with slightly altered mentation. He also had some issues seeing, described as blurry vision. EMS was called and patient was found to have an elevated temperature. Glucose was not low according to his daughter. Patient was placed on Day and was having some respiratory distress. Upon arrival to the ED, patient received 2L of NS, Vancomycin and Zosyn. CXR revealed a new RLL infiltrate consistent with pneumonia.  Review Of Systems: Per HPI with the following additions: None Otherwise 12 point review of systems was performed and was unremarkable.  Patient Active Problem List   Diagnosis Date Noted  . Pneumonia 05/12/2015  . Chronic kidney disease, stage III (moderate)   . Other specified hypotension   . CKD (chronic kidney disease), stage III   . Sepsis due to pneumonia ([place])   . Aspiration pneumonia due to food (regurgitated) ([place])   . Pneumonitis   . Other emphysema ([place])   . Systolic CHF, acute on chronic ([name])   . AICD (automatic cardioverter/defibrillator) present   . CAD S/P percutaneous coronary angioplasty   . Diabetes type 2, uncontrolled ([place])   . Acute respiratory failure with hypoxia ([place]) 04/03/2015  . Cardiomyopathy, ischemic  04/03/2015  . Elevated troponin 04/03/2015  . Sepsis ([place]) 03/31/2015  . Blood poisoning ([place])   . COPD exacerbation ([place])   . HCAP (healthcare-associated pneumonia) 03/21/2015  . Aspiration into airway 03/21/2015  . Achalasia 03/21/2015  . Dysphagia   .  Aspiration pneumonia ([place])   . Esophageal dysphagia   . Esophageal dysmotilities   . PNA (pneumonia) 02/07/2015  . COPD with acute exacerbation ([place]) 01/14/2015  . Left ventricular apical thrombus ([place]) 01/14/2015  . Acute on chronic systolic CHF (congestive heart failure) ([place]) 01/14/2015  . ARF (acute renal failure) ([place]) 01/14/2015  . Dysphagia, unspecified(787.20) 10/30/2013  . COPD GOLD IV 10/07/2013  . Influenza with pneumonia 09/11/2013  . Leukocytosis 09/11/2013  . Pneumonia, community acquired 09/10/2013  . Acute encephalopathy 09/10/2013  . Renal failure (ARF), acute on chronic ([name]) 09/10/2013  . Automatic implantable cardioverter-defibrillator in situ 07/06/2012  . CKD (chronic kidney disease) stage 3, GFR 30-59 ml/min 06/08/2012  . GERD (gastroesophageal reflux disease) 09/26/2011  . BPH (benign prostatic hyperplasia) 08/22/2011  . COPD (chronic obstructive pulmonary disease) ([place]) 07/17/2011  . S/P ICD (internal cardiac defibrillator) procedure 04/20/2011  . Diabetes mellitus type II, non insulin dependent ([place]) 01/21/2011  . Coronary artery disease   . Sick sinus syndrome ([place])   . Acute on chronic combined systolic and diastolic congestive heart failure, NYHA class 3 ([place])   . Hyperlipidemia   . PVC's (premature ventricular contractions)    Past Medical History: Past Medical History  Diagnosis Date  . GERD (gastroesophageal reflux disease)   . Coronary artery disease     History of remote anterior MI with PCI to LAD in 2006; most recent cath 2007, no intervention required  . Chronic systolic dysfunction of left ventricle     EF 30-35%, CLASS II - III SYMPTOMS; intolerant to Coreg  . Hyperlipidemia   . PVC's (premature ventricular contractions)   . BPH (benign prostatic hyperplasia)   . CHF (congestive heart failure) ([place])   . COPD (chronic obstructive pulmonary disease) ([place])   . Heart murmur     hx  . Pneumonia "several times"    aspiration pna with at  least 3 admits for this 2016.   . Type II diabetes mellitus ([place])   . CKD (chronic kidney disease) stage 3, GFR 30-59 ml/min 06/08/2012  . Skin cancer "several"    "forearms; head"  . Memory loss   . Anginal pain ([place])   . Dysrhythmia   . Myocardial infarction ([place]) 1994    ANTERIOR  . AICD (automatic cardioverter/defibrillator) present 03/30/2011    Analyze ST study patient  . DOE (dyspnea on exertion)     with heavy exertion   Past Surgical History: Past Surgical History  Procedure Laterality Date  . Foot fracture surgery Right 1980's  . Cholecystectomy  2/11  . Cataract extraction w/ intraocular lens  implant, bilateral  08/2014-09/2014  . Cardiac catheterization  01/11/2006    DEMONSTRATES AKINESIA OF THE DISTAL ANTERIOR WALL, DISTAL INFERIOR WALL AND AKINESIA OF THE APEX. THE BASAL SEGMENTS CONTRACT WELL AND OVERALL EF 35%  . Coronary angioplasty  1994    TO THE LAD  . Skin cancer excision  "several"    "forearms, head"  . Esophageal manometry N/A 03/16/2015    Procedure: ESOPHAGEAL MANOMETRY (EM);  Surgeon: Jerene Bears, MD;  Location: [facility];  Service: Gastroenterology;  Laterality: N/A;  . Insert / replace / remove pacemaker    . Esophagogastroduodenoscopy N/A 04/08/2015  Procedure: ESOPHAGOGASTRODUODENOSCOPY (EGD);  Surgeon: Milus Banister, MD;  Location: [facility];  Service: Endoscopy;  Laterality: N/A;  . Botox injection N/A 04/08/2015    Procedure: BOTOX INJECTION;  Surgeon: Milus Banister, MD;  Location: [facility];  Service: Endoscopy;  Laterality: N/A;   Social History: Social History  Substance Use Topics  . Smoking status: Former Smoker -- 1.00 packs/day for 35 years    Types: Cigarettes    Quit date: 08/01/1992  . Smokeless tobacco: Never Used  . Alcohol Use: 0.0 oz/week    0 Standard drinks or equivalent per week     Comment: Approximately a beer every 2 years   Additional social history: None  Please also refer to relevant sections of  EMR.  Family History: Family History  Problem Relation Age of Onset  . Stroke Father     Mother history unknown - never met her   Allergies and Medications: Allergies  Allergen Reactions  . Codeine Other (See Comments)    Gets very angry, disoriented  . Dilaudid [Hydromorphone Hcl] Other (See Comments)    VERY AGITATED, HOSTILE  . Flomax [Tamsulosin Hcl] Shortness Of Breath  . Morphine And Related Other (See Comments)    VERY AGITATED, HOSTILE  . Sulfa Antibiotics Shortness Of Breath  . Beta Adrenergic Blockers     ? disorientation  . Carvedilol Other (See Comments)    DISORIENTATION  . Levofloxacin Other (See Comments)    unknown   No current facility-administered medications on file prior to encounter.   Current Outpatient Prescriptions on File Prior to Encounter  Medication Sig Dispense Refill  . aspirin 81 MG EC tablet Take 81 mg by mouth daily.      . budesonide-formoterol (SYMBICORT) 160-4.5 MCG/ACT inhaler Inhale 2 puffs into the lungs 2 (two) times daily. 1 Inhaler 12  . Cholecalciferol (VITAMIN D PO) Take 2,000 Units by mouth daily.     Marland Kitchen donepezil (ARICEPT) 10 MG tablet Take 10 mg by mouth every morning.    . fenofibrate (TRICOR) 145 MG tablet TAKE 1 TABLET BY MOUTH EVERY DAY 30 tablet 6  . furosemide (LASIX) 20 MG tablet Take 2 tablets (40 mg total) by mouth daily. (Patient taking differently: Take 20 mg by mouth 2 (two) times daily. ) 90 tablet 2  . insulin glargine (LANTUS) 100 UNIT/ML injection Inject 0.28 mLs (28 Units total) into the skin every morning. (Patient taking differently: Inject 18 Units into the skin every morning. ) 10 mL 11  . losartan (COZAAR) 25 MG tablet Take 25 mg by mouth daily.    . memantine (NAMENDA) 10 MG tablet Take 10 mg by mouth 2 (two) times daily.    . Multiple Vitamins-Minerals (PRESERVISION AREDS 2) CAPS Take 1 capsule by mouth 2 (two) times daily.    . pantoprazole (PROTONIX) 20 MG tablet TAKE 1 TABLET BY MOUTH EVERY DAY 90 tablet  0  . pravastatin (PRAVACHOL) 40 MG tablet TAKE 1 TABLET BY MOUTH EVERY EVENING 90 tablet 0  . PROVENTIL HFA 108 (90 BASE) MCG/ACT inhaler Inhale 2 puffs into the lungs every 6 (six) hours as needed for wheezing or shortness of breath. As directed    . tiotropium (SPIRIVA) 18 MCG inhalation capsule Place 1 capsule (18 mcg total) into inhaler and inhale daily. 30 capsule 1  . nitroGLYCERIN (NITRODUR - DOSED IN MG/24 HR) 0.2 mg/hr patch Place 1 patch (0.2 mg total) onto the skin daily. 30 patch 2  . pantoprazole (PROTONIX) 20 MG tablet  TAKE 1 TABLET BY MOUTH DAILY (Patient not taking: Reported on 05/12/2015) 90 tablet 0    Objective: BP 101/45 mmHg  Pulse 92  Temp(Src) 98.6 F (37 C) (Oral)  Resp 22  Ht 5' 10"  (1.778 m)  SpO2 96% Exam: General: Fair appearing, no distress Eyes: PERRLA ENTM: Oropharynx moist and clear Neck: Supple, no adenopathy Cardiovascular: Regular rate and rhythm, no murmur, no elevated JVP, 1+ DP pulses Respiratory: Clear to auscultation with diminished breath sounds throughout Abdomen: Soft, non-tender, distended, normal bowel sounds MSK: Moves spontaneously, no calf tenderness Skin: multiple seborrheic keratoses Neuro: Alert, oriented x3. No noticeable resting tremor.  Psych: Normal affect  Labs and Imaging: CBC BMET   Recent Labs Lab 05/12/15 0853  WBC 21.5*  HGB 12.2*  HCT 40.1  PLT 244    Recent Labs Lab 05/12/15 0853  NA 141  K 4.2  CL 103  CO2 26  BUN 41*  CREATININE 1.78*  GLUCOSE 174*  CALCIUM 9.3     Dg Chest 2 View  05/12/2015   CLINICAL DATA:  Short of breath.  Fever.  EXAM: CHEST  2 VIEW  COMPARISON:  04/06/2015  FINDINGS: New area of infiltrate in the right lower lobe, probable pneumonia.  Interval clearing of bibasilar atelectasis and left effusion since prior study, these findings were felt to be due to heart failure at that time. No heart failure on today's study. Dual lead pacemaker noted. COPD with hyperinflation.   IMPRESSION: Right lower lobe infiltrate consistent with pneumonia  Clearing of heart failure with bibasilar atelectasis compared with the prior study.   Electronically Signed   By: Franchot Gallo M.D.   On: 05/12/2015 10:08    Mariel Aloe, MD 05/12/2015, 11:50 AM PGY-3, [place] Intern pager: [phone], text pages welcome

## 2015-05-12 NOTE — Progress Notes (Signed)
UR COMPLETED  

## 2015-05-12 NOTE — Evaluation (Signed)
Clinical/Bedside Swallow Evaluation Patient Details  Name: Antonio Ray MRN: [id number] Date of Birth: 1933-08-15  Today's Date: 05/12/2015 Time: SLP Start Time (ACUTE ONLY): 1600 SLP Stop Time (ACUTE ONLY): 1617 SLP Time Calculation (min) (ACUTE ONLY): 17 min  Past Medical History:  Past Medical History  Diagnosis Date  . GERD (gastroesophageal reflux disease)   . Coronary artery disease     History of remote anterior MI with PCI to LAD in 2006; most recent cath 2007, no intervention required  . Chronic systolic dysfunction of left ventricle     EF 30-35%, CLASS II - III SYMPTOMS; intolerant to Coreg  . Hyperlipidemia   . PVC's (premature ventricular contractions)   . BPH (benign prostatic hyperplasia)   . CHF (congestive heart failure) ([place])   . COPD (chronic obstructive pulmonary disease) ([place])   . Heart murmur     hx  . Pneumonia "several times"    aspiration pna with at least 3 admits for this 2016.   . Type II diabetes mellitus ([place])   . CKD (chronic kidney disease) stage 3, GFR 30-59 ml/min 06/08/2012  . Skin cancer "several"    "forearms; head"  . Memory loss   . Anginal pain ([place])   . Dysrhythmia   . Myocardial infarction ([place]) 1994    ANTERIOR  . AICD (automatic cardioverter/defibrillator) present 03/30/2011    Analyze ST study patient  . DOE (dyspnea on exertion)     with heavy exertion   Past Surgical History:  Past Surgical History  Procedure Laterality Date  . Foot fracture surgery Right 1980's  . Cholecystectomy  2/11  . Cataract extraction w/ intraocular lens  implant, bilateral  08/2014-09/2014  . Cardiac catheterization  01/11/2006    DEMONSTRATES AKINESIA OF THE DISTAL ANTERIOR WALL, DISTAL INFERIOR WALL AND AKINESIA OF THE APEX. THE BASAL SEGMENTS CONTRACT WELL AND OVERALL EF 35%  . Coronary angioplasty  1994    TO THE LAD  . Skin cancer excision  "several"    "forearms, head"  . Esophageal manometry N/A 03/16/2015    Procedure: ESOPHAGEAL  MANOMETRY (EM);  Surgeon: Jerene Bears, MD;  Location: [facility];  Service: Gastroenterology;  Laterality: N/A;  . Insert / replace / remove pacemaker    . Esophagogastroduodenoscopy N/A 04/08/2015    Procedure: ESOPHAGOGASTRODUODENOSCOPY (EGD);  Surgeon: Milus Banister, MD;  Location: [facility];  Service: Endoscopy;  Laterality: N/A;  . Botox injection N/A 04/08/2015    Procedure: BOTOX INJECTION;  Surgeon: Milus Banister, MD;  Location: [facility];  Service: Endoscopy;  Laterality: N/A;   HPI:  Pt is a 81 year old male with multiple prior admissions for aspiration pna due to achalasia and esophageal dysphagia. Aspiration risk has been high when pt swallowing any solids. Pt had botox injections to with GE junction on 04/08/15 and was upgraded to regular solids and thin liquids last admission. He is currently admitted with fever, dyspnea. His CXR shows Right lower lobe infiltrate consistent with pneumonia. Pt denies any difficulty swallowing since botox injection.    Assessment / Plan / Recommendation Clinical Impression  Pt demonstrates several concerning points in presentation and history that could indicate a risk of aspiration. Pt has wet vocal quality with thin liquids, possibly indicative of oropharyngeal dysphagia. Will f/u with MBS tomorrow for objective assessment. Also pt reports onset of difficulty breathing at night while sleeping. Pt does have a history of GERD and reports he sleeps on his right side or on his stomach.  Feel threre is a risk of aspiration of reflux during sleep. Will proceed with testing and recommendations. Pt may consume a regular diet and thin liqudis for now.     Aspiration Risk  Moderate    Diet Recommendation Age appropriate regular solids;Thin   Medication Administration: Whole meds with liquid Compensations: Small sips/bites;Slow rate;Follow solids with liquid    Other  Recommendations Oral Care Recommendations: Oral care BID   Follow Up  Recommendations       Frequency and Duration        Pertinent Vitals/Pain NA    SLP Swallow Goals     Swallow Study Prior Functional Status       General Other Pertinent Information: Pt is a 81 year old male with multiple prior admissions for aspiration pna due to achalasia and esophageal dysphagia. Aspiration risk has been high when pt swallowing any solids. Pt had botox injections to with GE junction on 04/08/15 and was upgraded to regular solids and thin liquids last admission. He is currently admitted with fever, dyspnea. His CXR shows Right lower lobe infiltrate consistent with pneumonia. Pt denies any difficulty swallowing since botox injection.  Type of Study: Bedside swallow evaluation Diet Prior to this Study: NPO Temperature Spikes Noted: No Respiratory Status: Supplemental O2 delivered via (comment) History of Recent Intubation: No Behavior/Cognition: Alert;Cooperative;Pleasant mood Oral Cavity - Dentition: Adequate natural dentition/normal for age Self-Feeding Abilities: Able to feed self Patient Positioning: Upright in bed Baseline Vocal Quality: Normal Volitional Cough: Strong Volitional Swallow: Able to elicit    Oral/Motor/Sensory Function Overall Oral Motor/Sensory Function: Appears within functional limits for tasks assessed   Ice Chips     Thin Liquid Thin Liquid: Impaired Presentation: Cup;Straw;Self Fed Pharyngeal  Phase Impairments: Wet Vocal Quality    Nectar Thick Nectar Thick Liquid: Not tested   Honey Thick Honey Thick Liquid: Not tested   Puree Puree: Within functional limits   Solid   GO    Solid: Within functional limits      [facility], MA CCC-SLP [phone]  Lynann Beaver 05/12/2015,4:29 PM

## 2015-05-12 NOTE — ED Notes (Signed)
Patient transported to X-ray 

## 2015-05-12 NOTE — ED Provider Notes (Signed)
CSN: [id number]     Arrival date & time 05/12/15  1062 History   First MD Initiated Contact with Patient 05/12/15 0825     Chief Complaint  Patient presents with  . Shortness of Breath     (Consider location/radiation/quality/duration/timing/severity/associated sxs/prior Treatment) Patient is a 81 y.o. male presenting with shortness of breath. The history is provided by the patient, the EMS personnel, a relative and medical records. No language interpreter was used.  Shortness of Breath Associated symptoms: fever   Associated symptoms: no abdominal pain, no chest pain, no cough, no diaphoresis, no headaches, no rash, no vomiting and no wheezing      Antonio Ray is a 81 y.o. male  with a hx of , CAD (anterior MI with PCI to LAD in 2006), CHF with chronic systolic dysfunction and EF of 30-35%, COPD, insulin-dependent diabetes, chronic kidney disease, recurrent pneumonia 5 since June 2016 presents to the Emergency Department complaining of gradual, persistent, progressively worsening shortness of breath onset 2 AM.  Patient does not wear oxygen at home, but EMS reports option saturation of 90% on room air. They also report a fever of 102.0 in the home. Patient's daughter is at bedside and reports that he was slightly confused this morning which is abnormal for him; however he is conscious alert and oriented for me.  Record review shows the patient has been admitted numerous times in the last several months for hospital-acquired pneumonia. The first admission for pneumonia in June 2016 was attributed to an aspiration. Patient has been seen multiple times by GI for discussion of Botox injections to treat achalasia.  Patient reports that he feels generally unwell. He denies chest pain, diaphoresis, nausea, vomiting, diarrhea, syncope, dysuria.  PCP Dr. Doreene Nest of [facility]   Past Medical History  Diagnosis Date  . GERD (gastroesophageal reflux disease)   . Coronary artery disease      History of remote anterior MI with PCI to LAD in 2006; most recent cath 2007, no intervention required  . Chronic systolic dysfunction of left ventricle     EF 30-35%, CLASS II - III SYMPTOMS; intolerant to Coreg  . Hyperlipidemia   . PVC's (premature ventricular contractions)   . BPH (benign prostatic hyperplasia)   . CHF (congestive heart failure) ([place])   . COPD (chronic obstructive pulmonary disease) ([place])   . Heart murmur     hx  . Pneumonia "several times"    aspiration pna with at least 3 admits for this 2016.   . Type II diabetes mellitus ([place])   . CKD (chronic kidney disease) stage 3, GFR 30-59 ml/min 06/08/2012  . Skin cancer "several"    "forearms; head"  . Memory loss   . Anginal pain ([place])   . Dysrhythmia   . Myocardial infarction ([place]) 1994    ANTERIOR  . AICD (automatic cardioverter/defibrillator) present 03/30/2011    Analyze ST study patient  . DOE (dyspnea on exertion)     with heavy exertion   Past Surgical History  Procedure Laterality Date  . Foot fracture surgery Right 1980's  . Cholecystectomy  2/11  . Cataract extraction w/ intraocular lens  implant, bilateral  08/2014-09/2014  . Cardiac catheterization  01/11/2006    DEMONSTRATES AKINESIA OF THE DISTAL ANTERIOR WALL, DISTAL INFERIOR WALL AND AKINESIA OF THE APEX. THE BASAL SEGMENTS CONTRACT WELL AND OVERALL EF 35%  . Coronary angioplasty  1994    TO THE LAD  . Skin cancer excision  "several"    "  forearms, head"  . Esophageal manometry N/A 03/16/2015    Procedure: ESOPHAGEAL MANOMETRY (EM);  Surgeon: Jerene Bears, MD;  [facility];  Service: Gastroenterology;  Laterality: N/A;  . Insert / replace / remove pacemaker    . Esophagogastroduodenoscopy N/A 04/08/2015    Procedure: ESOPHAGOGASTRODUODENOSCOPY (EGD);  Surgeon: Milus Banister, MD;  Location: [facility];  Service: Endoscopy;  Laterality: N/A;  . Botox injection N/A 04/08/2015    Procedure: BOTOX INJECTION;  Surgeon: Milus Banister, MD;   Location: [facility];  Service: Endoscopy;  Laterality: N/A;   Family History  Problem Relation Age of Onset  . Stroke Father     Mother history unknown - never met her   Social History  Substance Use Topics  . Smoking status: Former Smoker -- 1.00 packs/day for 35 years    Types: Cigarettes    Quit date: 08/01/1992  . Smokeless tobacco: Never Used  . Alcohol Use: 0.0 oz/week    0 Standard drinks or equivalent per week     Comment: Approximately a beer every 2 years    Review of Systems  Constitutional: Positive for fever. Negative for diaphoresis, appetite change, fatigue and unexpected weight change.  HENT: Negative for mouth sores.   Eyes: Negative for visual disturbance.  Respiratory: Positive for shortness of breath. Negative for cough, chest tightness and wheezing.   Cardiovascular: Negative for chest pain.  Gastrointestinal: Negative for nausea, vomiting, abdominal pain, diarrhea and constipation.  Endocrine: Negative for polydipsia, polyphagia and polyuria.  Genitourinary: Negative for dysuria, urgency, frequency and hematuria.  Musculoskeletal: Negative for back pain and neck stiffness.  Skin: Negative for rash.  Allergic/Immunologic: Negative for immunocompromised state.  Neurological: Negative for syncope, light-headedness and headaches.  Hematological: Does not bruise/bleed easily.  Psychiatric/Behavioral: Negative for sleep disturbance. The patient is not nervous/anxious.       Allergies  Codeine; Dilaudid; Flomax; Morphine and related; Sulfa antibiotics; Beta adrenergic blockers; Carvedilol; and Levofloxacin  Home Medications   Prior to Admission medications   Medication Sig Start Date End Date Taking? Authorizing Provider  aspirin 81 MG EC tablet Take 81 mg by mouth daily.     Yes Historical Provider, MD  budesonide-formoterol (SYMBICORT) 160-4.5 MCG/ACT inhaler Inhale 2 puffs into the lungs 2 (two) times daily. 09/19/14  Yes Juanito Doom, MD   Cholecalciferol (VITAMIN D PO) Take 2,000 Units by mouth daily.    Yes Historical Provider, MD  donepezil (ARICEPT) 10 MG tablet Take 10 mg by mouth every morning.   Yes Historical Provider, MD  fenofibrate (TRICOR) 145 MG tablet TAKE 1 TABLET BY MOUTH EVERY DAY 01/22/15  Yes Peter M Martinique, MD  furosemide (LASIX) 20 MG tablet Take 2 tablets (40 mg total) by mouth daily. Patient taking differently: Take 20 mg by mouth 2 (two) times daily.  04/11/15  Yes Allie Bossier, MD  insulin glargine (LANTUS) 100 UNIT/ML injection Inject 0.28 mLs (28 Units total) into the skin every morning. Patient taking differently: Inject 18 Units into the skin every morning.  04/11/15  Yes Allie Bossier, MD  losartan (COZAAR) 25 MG tablet Take 25 mg by mouth daily.   Yes Historical Provider, MD  memantine (NAMENDA) 10 MG tablet Take 10 mg by mouth 2 (two) times daily.   Yes Historical Provider, MD  Multiple Vitamins-Minerals (PRESERVISION AREDS 2) CAPS Take 1 capsule by mouth 2 (two) times daily.   Yes Historical Provider, MD  pantoprazole (PROTONIX) 20 MG tablet TAKE 1 TABLET BY  MOUTH EVERY DAY 07/18/14  Yes Inda Castle, MD  pravastatin (PRAVACHOL) 40 MG tablet TAKE 1 TABLET BY MOUTH EVERY EVENING 02/20/15  Yes Peter M Martinique, MD  PROVENTIL HFA 108 (90 BASE) MCG/ACT inhaler Inhale 2 puffs into the lungs every 6 (six) hours as needed for wheezing or shortness of breath. As directed 05/24/13  Yes Historical Provider, MD  tiotropium (SPIRIVA) 18 MCG inhalation capsule Place 1 capsule (18 mcg total) into inhaler and inhale daily. 03/12/15  Yes Juanito Doom, MD  nitroGLYCERIN (NITRODUR - DOSED IN MG/24 HR) 0.2 mg/hr patch Place 1 patch (0.2 mg total) onto the skin daily. 05/08/15   Thompson Grayer, MD  pantoprazole (PROTONIX) 20 MG tablet TAKE 1 TABLET BY MOUTH DAILY Patient not taking: Reported on 05/12/2015 05/08/15   Inda Castle, MD   BP 95/44 mmHg  Pulse 77  Temp(Src) 98.6 F (37 C) (Oral)  Resp 18  Ht 5' 10"   (1.778 m)  SpO2 95% Physical Exam  Constitutional: He is oriented to person, place, and time. He appears well-developed and well-nourished. No distress.  Awake, alert, ill-appearing  HENT:  Head: Normocephalic and atraumatic.  Right Ear: Tympanic membrane, external ear and ear canal normal.  Left Ear: Tympanic membrane, external ear and ear canal normal.  Nose: Nose normal. No epistaxis. Right sinus exhibits no maxillary sinus tenderness and no frontal sinus tenderness. Left sinus exhibits no maxillary sinus tenderness and no frontal sinus tenderness.  Mouth/Throat: Uvula is midline, oropharynx is clear and moist and mucous membranes are normal. Mucous membranes are not pale and not cyanotic. No oropharyngeal exudate, posterior oropharyngeal edema, posterior oropharyngeal erythema or tonsillar abscesses.  Eyes: Conjunctivae are normal. Pupils are equal, round, and reactive to light. No scleral icterus.  Neck: Normal range of motion and full passive range of motion without pain. Neck supple.  Cardiovascular: Regular rhythm and intact distal pulses.  Tachycardia present.   Pulses:      Radial pulses are 2+ on the right side, and 2+ on the left side.       Dorsalis pedis pulses are 2+ on the right side, and 2+ on the left side.  Capillary refill < 3 sec  Pulmonary/Chest: Accessory muscle usage present. No stridor. Tachypnea noted. No respiratory distress. He has decreased breath sounds in the right middle field and the right lower field. He has wheezes in the right middle field and the right lower field. He has no rhonchi. He has no rales.  Equal chest expansion Increased work of breathing with mild accessory muscle usage and tachypnea  Abdominal: Soft. Bowel sounds are normal. He exhibits no mass. There is no tenderness. There is no rebound, no guarding and no CVA tenderness.  Musculoskeletal: Normal range of motion. He exhibits edema.  Mild edema of the BLE  Lymphadenopathy:    He has no  cervical adenopathy.  Neurological: He is alert and oriented to person, place, and time.  Speech is clear and goal oriented Moves extremities without ataxia  Skin: Skin is warm and dry. No rash noted. He is not diaphoretic. No erythema.  Psychiatric: He has a normal mood and affect.  Nursing note and vitals reviewed.   ED Course  Procedures (including critical care time) Labs Review Labs Reviewed  COMPREHENSIVE METABOLIC PANEL - Abnormal; Notable for the following:    Glucose, Bld 174 (*)    BUN 41 (*)    Creatinine, Ser 1.78 (*)    Albumin 3.4 (*)  Alkaline Phosphatase 37 (*)    GFR calc non Af Amer 35 (*)    GFR calc Af Amer 40 (*)    All other components within normal limits  CBC WITH DIFFERENTIAL/PLATELET - Abnormal; Notable for the following:    WBC 21.5 (*)    Hemoglobin 12.2 (*)    RDW 15.7 (*)    Neutro Abs 19.3 (*)    Monocytes Absolute 1.2 (*)    All other components within normal limits  BRAIN NATRIURETIC PEPTIDE - Abnormal; Notable for the following:    B Natriuretic Peptide 136.5 (*)    All other components within normal limits  CULTURE, BLOOD (ROUTINE X 2)  CULTURE, BLOOD (ROUTINE X 2)  URINE CULTURE  URINALYSIS, ROUTINE W REFLEX MICROSCOPIC (NOT AT [facility])  I-STAT CG4 LACTIC ACID, ED  I-STAT TROPOININ, ED  I-STAT CG4 LACTIC ACID, ED    Imaging Review Dg Chest 2 View  05/12/2015   CLINICAL DATA:  Short of breath.  Fever.  EXAM: CHEST  2 VIEW  COMPARISON:  04/06/2015  FINDINGS: New area of infiltrate in the right lower lobe, probable pneumonia.  Interval clearing of bibasilar atelectasis and left effusion since prior study, these findings were felt to be due to heart failure at that time. No heart failure on today's study. Dual lead pacemaker noted. COPD with hyperinflation.  IMPRESSION: Right lower lobe infiltrate consistent with pneumonia  Clearing of heart failure with bibasilar atelectasis compared with the prior study.   Electronically Signed   By:  Franchot Gallo M.D.   On: 05/12/2015 10:08   I have personally reviewed and evaluated these images and lab results as part of my medical decision-making.   EKG Interpretation   Date/Time:  Tuesday May 12 2015 08:55:52 EDT Ventricular Rate:  106 PR Interval:  167 QRS Duration: 111 QT Interval:  345 QTC Calculation: 458 R Axis:   -92 Text Interpretation:  Sinus tachycardia Left anterior fascicular block  Anterior infarct, old rate is faster compared to Sept 2016 Confirmed by  Regenia Skeeter  MD, [place] ([id number]) on 05/12/2015 9:04:53 AM       CRITICAL CARE Performed by: Abigail Butts Total critical care time: 39mn Critical care time was exclusive of separately billable procedures and treating other patients. Critical care was necessary to treat or prevent imminent or life-threatening deterioration. Critical care was time spent personally by me on the following activities: development of treatment plan with patient and/or surrogate as well as nursing, discussions with consultants, evaluation of patient's response to treatment, examination of patient, obtaining history from patient or surrogate, ordering and performing treatments and interventions, ordering and review of laboratory studies, ordering and review of radiographic studies, pulse oximetry and re-evaluation of patient's condition.   MDM   Final diagnoses:  Sepsis due to pneumonia ([place]  CKD (chronic kidney disease), stage III  AICD (automatic cardioverter/defibrillator) present  CAD S/P percutaneous coronary angioplasty  HCAP (healthcare-associated pneumonia)   Antonio Ray presents with shortness of breath, hx of CHF, COPD and recurrent PNA.  Suspect recurrence of his hospital-acquired pneumonia again at this time.  Patient reportedly febrile on scene, tachycardic and tachypneic.  Here with likely source of pneumonia. Code sepsis initiated.    10:15 AM Urinalysis without evidence of urinary tract infection.  Negative troponin and normal lactic acid. Patient with large leukocytosis of 21.5. Creatinine 1.78.  Slightly elevated BNP at 136 however I do not believe his symptoms today to be attributed to his congestive heart failure. Physical  exam is consistent with pneumonia and a chest x-ray confirms this.  Patient will need admission. IV antibiotics started.  11:30 AM Discussed with family medicine who will admit to stepdown for further management.  Patient with improved fever and tachycardia. Systolic blood pressures remain in the low 100s to upper 90s.  Family reports this is normal and patient is mentating well. He remains on oxygen but his accessory muscle usage has decreased.  The patient was discussed with and seen by Dr. Regenia Skeeter who agrees with the treatment plan.  BP 95/44 mmHg  Pulse 77  Temp(Src) 98.6 F (37 C) (Oral)  Resp 18  Ht 5' 10"  (1.778 m)  SpO2 95%     Abigail Butts, PA-C 05/12/15 1216  Sherwood Gambler, MD 05/19/15 [phone]

## 2015-05-12 NOTE — Progress Notes (Addendum)
ANTIBIOTIC CONSULT NOTE - INITIAL  Pharmacy Consult for vanc/zosyn/levo Indication: rule out pneumonia  Allergies  Allergen Reactions  . Codeine Other (See Comments)    Gets very angry, disoriented  . Dilaudid [Hydromorphone Hcl] Other (See Comments)    VERY AGITATED, HOSTILE  . Flomax [Tamsulosin Hcl] Shortness Of Breath  . Morphine And Related Other (See Comments)    VERY AGITATED, HOSTILE  . Sulfa Antibiotics Shortness Of Breath  . Beta Adrenergic Blockers     ? disorientation  . Carvedilol Other (See Comments)    DISORIENTATION  . Levofloxacin Other (See Comments)    unknown    Patient Measurements: Height: 5\' 10"  (177.8 cm) IBW/kg (Calculated) : 73  Vital Signs: Temp: 99.7 F (37.6 C) (10/11 0833) Temp Source: Oral (10/11 0833) BP: 124/57 mmHg (10/11 0833) Pulse Rate: 109 (10/11 0833) Intake/Output from previous day:   Intake/Output from this shift:    Labs: No results for input(s): WBC, HGB, PLT, LABCREA, CREATININE in the last 72 hours. CrCl cannot be calculated (Unknown ideal weight.). No results for input(s): VANCOTROUGH, VANCOPEAK, VANCORANDOM, GENTTROUGH, GENTPEAK, GENTRANDOM, TOBRATROUGH, TOBRAPEAK, TOBRARND, AMIKACINPEAK, AMIKACINTROU, AMIKACIN in the last 72 hours.   Microbiology: No results found for this or any previous visit (from the past 720 hour(s)).  Medical History: Past Medical History  Diagnosis Date  . GERD (gastroesophageal reflux disease)   . Coronary artery disease     History of remote anterior MI with PCI to LAD in 2006; most recent cath 2007, no intervention required  . Chronic systolic dysfunction of left ventricle     EF 30-35%, CLASS II - III SYMPTOMS; intolerant to Coreg  . Hyperlipidemia   . PVC's (premature ventricular contractions)   . BPH (benign prostatic hyperplasia)   . CHF (congestive heart failure) ([place])   . COPD (chronic obstructive pulmonary disease) ([place])   . Heart murmur     hx  . Pneumonia "several times"    aspiration pna with at least 3 admits for this 2016.   . Type II diabetes mellitus ([place])   . CKD (chronic kidney disease) stage 3, GFR 30-59 ml/min 06/08/2012  . Skin cancer "several"    "forearms; head"  . Memory loss   . Anginal pain ([place])   . Dysrhythmia   . Myocardial infarction ([place]) 1994    ANTERIOR  . AICD (automatic cardioverter/defibrillator) present 03/30/2011    Analyze ST study patient  . DOE (dyspnea on exertion)     with heavy exertion    Assessment: 81 yom with SOB, Temp 102. Treated for PNA x5 since June 2016. Afebrile, wbc 21.5. Code Sepsis called at 6962 - zosyn/vanc given within 1 hour time window. SCr 1.78 on admit (1.48 on 9/23), CrCl~40.  10/11 Vanc>> 10/11 zosyn>>  10/11 BCx2>> 10/11 UC>>  Goal of Therapy:  Vancomycin trough level 15-20 mcg/ml  Plan:  Vanc 1750mg  IV x 1; then Vanc 1g IV q24h Zosyn 3.375g IV (53min inf) x1; then Zosyn 3.375g IV q8h Monitor clinical progress, c/s, renal function, abx plan/LOT VT@SS  as indicated   Elicia Lamp, PharmD Clinical Pharmacist Pager [phone] 05/12/2015 9:01 AM   ADDENDUM:  Levofloxacin added for double coverage per IDSA guidelines recommending double coverage for patients who have received IV abx in past 90 days.  Plan: Levo 750mg  IV q48h Monitor abx plan/LOT  Elicia Lamp, PharmD Clinical Pharmacist Pager [phone] 05/12/2015 2:13 PM

## 2015-05-13 ENCOUNTER — Inpatient Hospital Stay (HOSPITAL_COMMUNITY): Payer: Medicare Other

## 2015-05-13 DIAGNOSIS — R1310 Dysphagia, unspecified: Secondary | ICD-10-CM

## 2015-05-13 DIAGNOSIS — Z95810 Presence of automatic (implantable) cardiac defibrillator: Secondary | ICD-10-CM

## 2015-05-13 LAB — GLUCOSE, CAPILLARY
GLUCOSE-CAPILLARY: 110 mg/dL — AB (ref 65–99)
GLUCOSE-CAPILLARY: 198 mg/dL — AB (ref 65–99)
Glucose-Capillary: 131 mg/dL — ABNORMAL HIGH (ref 65–99)
Glucose-Capillary: 200 mg/dL — ABNORMAL HIGH (ref 65–99)

## 2015-05-13 LAB — TROPONIN I
Troponin I: 0.03 ng/mL (ref <0.031)
Troponin I: 0.03 ng/mL (ref <0.031)

## 2015-05-13 LAB — URINE CULTURE

## 2015-05-13 LAB — BASIC METABOLIC PANEL
Anion gap: 6 (ref 5–15)
BUN: 32 mg/dL — AB (ref 6–20)
CHLORIDE: 104 mmol/L (ref 101–111)
CO2: 29 mmol/L (ref 22–32)
Calcium: 8.3 mg/dL — ABNORMAL LOW (ref 8.9–10.3)
Creatinine, Ser: 1.7 mg/dL — ABNORMAL HIGH (ref 0.61–1.24)
GFR calc Af Amer: 42 mL/min — ABNORMAL LOW (ref >60)
GFR calc non Af Amer: 37 mL/min — ABNORMAL LOW (ref >60)
GLUCOSE: 115 mg/dL — AB (ref 65–99)
POTASSIUM: 4.3 mmol/L (ref 3.5–5.1)
SODIUM: 139 mmol/L (ref 135–145)

## 2015-05-13 LAB — CBC
HCT: 34.4 % — ABNORMAL LOW (ref 39.0–52.0)
HEMOGLOBIN: 10.4 g/dL — AB (ref 13.0–17.0)
MCH: 26.8 pg (ref 26.0–34.0)
MCHC: 30.2 g/dL (ref 30.0–36.0)
MCV: 88.7 fL (ref 78.0–100.0)
Platelets: 191 10*3/uL (ref 150–400)
RBC: 3.88 MIL/uL — AB (ref 4.22–5.81)
RDW: 16.4 % — ABNORMAL HIGH (ref 11.5–15.5)
WBC: 16.8 10*3/uL — ABNORMAL HIGH (ref 4.0–10.5)

## 2015-05-13 MED ORDER — CLINDAMYCIN PHOSPHATE 600 MG/50ML IV SOLN
600.0000 mg | Freq: Three times a day (TID) | INTRAVENOUS | Status: DC
  Administered 2015-05-13: 600 mg via INTRAVENOUS
  Filled 2015-05-13 (×3): qty 50

## 2015-05-13 MED ORDER — FUROSEMIDE 40 MG PO TABS
40.0000 mg | ORAL_TABLET | Freq: Two times a day (BID) | ORAL | Status: DC
  Administered 2015-05-13 – 2015-05-15 (×5): 40 mg via ORAL
  Filled 2015-05-13 (×6): qty 1

## 2015-05-13 NOTE — Progress Notes (Signed)
05/13/2015 6: 28 PM  Attempted report. No answer. Will attempt to call again.  [employer], RN-BC, [employer] [facility] [address] Phone [phone]

## 2015-05-13 NOTE — Evaluation (Signed)
Physical Therapy Evaluation [name] MRN: [id number] DOB: [birth year] Today's Date: 05/13/2015   History of Present Illness  Pt is a 81 y/o male with PMH COPD, CAD, HFpEF s/p AICD, T2DM, and multiple recent admissions for PNA presents with acute dyspnea and pneumonia.  Pt was intubed 9/1-9/3 during recent hospital admission 2/2 acute respiratory failure.    Clinical Impression  Pt admitted with above diagnosis. Pt currently with functional limitations due to the deficits listed below (see PT Problem List). Antonio Ray is unaware of his balance deficits putting him at increased risk for falls.  Pt at supervision>min guard assist level ambulating w/ RW and would benefit from OPPT after d/c to address balance deficits.  Pt will benefit from skilled PT to increase their independence and safety with mobility to allow discharge to the venue listed below.      Follow Up Recommendations Outpatient PT (to address balance impairments)    Equipment Recommendations  None recommended by PT    Recommendations for Other Services       Precautions / Restrictions Precautions Precautions: Fall Restrictions Weight Bearing Restrictions: No      Mobility  Bed Mobility               General bed mobility comments: Pt in chair at start and end of PT session  Transfers Overall transfer level: Needs assistance Equipment used: Rolling walker (2 wheeled);None Transfers: Sit to/from Stand Sit to Stand: Supervision         General transfer comment: Min unsteady but able to perform sit<>stand x10 w/ supervision.  Sit<>stand w/ RW performed x1 before/after ambulation w/ supervision.  Ambulation/Gait Ambulation/Gait assistance: Min guard Ambulation Distance (Feet): 350 Feet Assistive device: Rolling walker (2 wheeled) Gait Pattern/deviations: Step-through pattern   Gait velocity interpretation: at or above normal speed for age/gender General Gait Details: Supervision  initially w/ pt using RW.  After ambulating ~300 ft reports generalized LE fatigue and min guard assist provided to ensure pt's stability.    Stairs            Wheelchair Mobility    Modified Rankin (Stroke Patients Only)       Balance                                             Pertinent Vitals/Pain Pain Assessment: No/denies pain    Home Living Family/patient expects to be discharged to:: Private residence Living Arrangements: Alone Available Help at Discharge: Family;Available PRN/intermittently (Daughter 5 houses down the street; works during the day) Type of Home: House Home Access: Stairs to enter Entrance Stairs-Rails: Can reach both Technical brewer of Steps: [place]: Two level;Able to live on main level with bedroom/bathroom Home Equipment: Walker - 2 wheels (walking stick) Additional Comments: Uses walking stick when outside his house for balance and to help him get up if he falls.  However, he denies any falls in the past year.  Reports that he couch surfs inside his home.  His son comes to visit pt when he is off work.    Prior Function Level of Independence: Independent               Hand Dominance        Extremity/Trunk Assessment   Upper Extremity Assessment: Overall WFL for tasks assessed  Lower Extremity Assessment: Overall WFL for tasks assessed         Communication   Communication: HOH  Cognition Arousal/Alertness: Awake/alert Behavior During Therapy: WFL for tasks assessed/performed Overall Cognitive Status: History of cognitive impairments - at baseline (Mild dementia)       Memory: Decreased short-term memory              General Comments General comments (skin integrity, edema, etc.): SpO2 fluctuating in 80s to 90s throughout ambulation but poor waveform, pt not symptomatic and was educated on breathing technique.  Called Cardiac Monitor staff prior to ambulation to monitor  EKG, no concern reported.  Discussed w/ pt benefits of using RW at least out in the community if not in home as he reports he is currently couch surfing in him home.    Exercises General Exercises - Lower Extremity Long Arc Quad: AROM;Both;15 reps;Seated Hip Flexion/Marching: AROM;Both;20 reps;Seated Other Exercises Other Exercises: Sit<>stand x10 w/ min guard assist for safety      Assessment/Plan    PT Assessment Patient needs continued PT services  PT Diagnosis Generalized weakness   PT Problem List Decreased strength;Decreased activity tolerance;Decreased balance;Decreased mobility;Decreased knowledge of use of DME;Decreased safety awareness;Cardiopulmonary status limiting activity  PT Treatment Interventions DME instruction;Gait training;Stair training;Functional mobility training;Therapeutic activities;Therapeutic exercise;Balance training;Neuromuscular re-education;Patient/family education;Cognitive remediation   PT Goals (Current goals can be found in the Care Plan section) Acute Rehab PT Goals Patient Stated Goal: to remain strong and independent PT Goal Formulation: With patient Time For Goal Achievement: 05/27/15 Potential to Achieve Goals: Good    Frequency Min 3X/week   Barriers to discharge Inaccessible home environment;Decreased caregiver support steps to enter home and intermittent assist available at home    Co-evaluation               End of Session Equipment Utilized During Treatment: Gait belt Activity Tolerance: Patient limited by fatigue Patient left: in chair;with call bell/phone within reach Nurse Communication: Mobility status         Time: 1152-1223 PT Time Calculation (min) (ACUTE ONLY): 31 min   Charges:   PT Evaluation $Initial PT Evaluation Tier I: 1 Procedure PT Treatments $Gait Training: 8-22 mins   PT G CodesJoslyn Hy PT, DPT [phone] Pager: [phone] 05/13/2015, 1:10 PM

## 2015-05-13 NOTE — Progress Notes (Signed)
MBSS complete. Full report located under chart review in imaging section. Radiologist also present and contributed to report.  Herbie Baltimore, [place] CCC-SLP [phone]

## 2015-05-14 LAB — BASIC METABOLIC PANEL
ANION GAP: 11 (ref 5–15)
BUN: 24 mg/dL — ABNORMAL HIGH (ref 6–20)
CALCIUM: 8.8 mg/dL — AB (ref 8.9–10.3)
CO2: 28 mmol/L (ref 22–32)
Chloride: 103 mmol/L (ref 101–111)
Creatinine, Ser: 1.81 mg/dL — ABNORMAL HIGH (ref 0.61–1.24)
GFR, EST AFRICAN AMERICAN: 39 mL/min — AB (ref >60)
GFR, EST NON AFRICAN AMERICAN: 34 mL/min — AB (ref >60)
Glucose, Bld: 133 mg/dL — ABNORMAL HIGH (ref 65–99)
POTASSIUM: 3.8 mmol/L (ref 3.5–5.1)
Sodium: 142 mmol/L (ref 135–145)

## 2015-05-14 LAB — GLUCOSE, CAPILLARY
GLUCOSE-CAPILLARY: 220 mg/dL — AB (ref 65–99)
GLUCOSE-CAPILLARY: 213 mg/dL — AB (ref 65–99)
Glucose-Capillary: 122 mg/dL — ABNORMAL HIGH (ref 65–99)
Glucose-Capillary: 172 mg/dL — ABNORMAL HIGH (ref 65–99)

## 2015-05-14 LAB — CBC
HCT: 36.9 % — ABNORMAL LOW (ref 39.0–52.0)
HEMOGLOBIN: 11.4 g/dL — AB (ref 13.0–17.0)
MCH: 27 pg (ref 26.0–34.0)
MCHC: 30.9 g/dL (ref 30.0–36.0)
MCV: 87.4 fL (ref 78.0–100.0)
PLATELETS: 191 10*3/uL (ref 150–400)
RBC: 4.22 MIL/uL (ref 4.22–5.81)
RDW: 15.9 % — ABNORMAL HIGH (ref 11.5–15.5)
WBC: 10.1 10*3/uL (ref 4.0–10.5)

## 2015-05-14 MED ORDER — LEVOFLOXACIN 750 MG PO TABS
750.0000 mg | ORAL_TABLET | Freq: Every day | ORAL | Status: DC
Start: 2015-05-14 — End: 2015-05-14

## 2015-05-14 MED ORDER — LEVOFLOXACIN 750 MG PO TABS
750.0000 mg | ORAL_TABLET | ORAL | Status: DC
  Administered 2015-05-14: 750 mg via ORAL
  Filled 2015-05-14: qty 1

## 2015-05-14 NOTE — Progress Notes (Signed)
Family Medicine Teaching Service Daily Progress Note Intern Pager: [phone]  Patient name: Antonio Ray Medical record number: [id number] Date of birth: March 18, 1934 Age: 81 y.o. Gender: male  Primary Care Provider: [name], MD Consultants: None  Code Status: Full  Pt Overview and Major Events to Date:   Assessment and Plan: Antonio Ray is a 81 y.o. male presenting with dyspnea. PMH is significant for COPD, Multiple episodes of PNA, MI, HFrEF s/p AICD, TDM2, CAD  Pneumonia: Lung clear to ausculation this AM. Vitals Pulse ox 94, RR 18. Afebrile. Hx of recurrent pneumonia 5 since June 2016. CXR Right lower lobe infiltrate consistent with pneumonia.  Leukocytosis of 21.5 initially. WBC trending down, now at 9.3   - Continue PO Levaquin until 10/17 for a total of 5 days of antibiotics  - F/u blood/urine cultures, NG x 2 days - Continue to monitor vitals    HFrEF/AICD: 2+ pitting edema mid-calves. Last EF of 10-15% in September 2016. Lower extremity Edema 2+ ankle edema, no shortness of breath. Pent has an AICD in place.  - Discharge weight from last admission 83.6 kg (184 pounds)--> Currently 193lbs.  - Continue Lasix 40 BID  AKI on CKD3: Scr 1.70>>1.81>1.9 Baseline Scr -1.5. Patient not likely hypovolemic - Will continue to follow   Achalasia  -S/P Botox injection 04/08/15 -Follow-up with Dr. Zenovia Jarred (GI) on 06/11/2015  -Follow up of swallow study indicating no diet changes needed.   CAD: PCI to LAD 2006. follow-up cardiac cath 2007 with no intervention required. Myoview in 2011 low risk. No stents placed.  Troponin neg x 3. EKG unchanged from previous.  - continue nitroglycerin patch daily - continue pravastatin and fenofibrate   Diabetes type 2: on insulin - Continue Lantus 10u daily - sensitive SSI with qHS   GERD - continue protonix  Cognitive impairment - continue home Namenda and Aricept  FEN/GI: Carb/heart healthy Prophylaxis: Lovenox  Disposition:  Home tommorrow  Subjective:  Patient with intermittently SOB. No chest pain, chills, nausea/ vomiting   Objective: Temp:  [98.1 F (36.7 C)-98.7 F (37.1 C)] 98.3 F (36.8 C) (10/13 2054) Pulse Rate:  [58-73] 64 (10/13 2054) Resp:  [16-22] 16 (10/13 2054) BP: (109-128)/(45-63) 119/55 mmHg (10/13 2054) SpO2:  [95 %-98 %] 95 % (10/13 2054) Weight:  [193 lb 2 oz (87.6 kg)] 193 lb 2 oz (87.6 kg) (10/12 2214) Physical Exam: General: Sitting in Chair, NAD Cardiovascular: RRR, no murmurs Respiratory: CTAB,  Abdomen: BS+, non-tender, non-distended  Extremities:  2+ pitting edema to mid-calves   Laboratory:  Recent Labs Lab 05/12/15 0853 05/13/15 0400 05/14/15 1036  WBC 21.5* 16.8* 10.1  HGB 12.2* 10.4* 11.4*  HCT 40.1 34.4* 36.9*  PLT 244 191 191    Recent Labs Lab 05/12/15 0853 05/13/15 0400 05/14/15 0640  NA 141 139 142  K 4.2 4.3 3.8  CL 103 104 103  CO2 26 29 28   BUN 41* 32* 24*  CREATININE 1.78* 1.70* 1.81*  CALCIUM 9.3 8.3* 8.8*  PROT 6.5  --   --   BILITOT 0.5  --   --   ALKPHOS 37*  --   --   ALT 20  --   --   AST 29  --   --   GLUCOSE 174* 115* 133*    Imaging/Diagnostic Tests: Dg Chest 2 View  05/12/2015  CLINICAL DATA:  Short of breath.  Fever. EXAM: CHEST  2 VIEW COMPARISON:  04/06/2015 FINDINGS: New area of infiltrate in the  right lower lobe, probable pneumonia. Interval clearing of bibasilar atelectasis and left effusion since prior study, these findings were felt to be due to heart failure at that time. No heart failure on today's study. Dual lead pacemaker noted. COPD with hyperinflation. IMPRESSION: Right lower lobe infiltrate consistent with pneumonia Clearing of heart failure with bibasilar atelectasis compared with the prior study. Electronically Signed   By: Franchot Gallo M.D.   On: 05/12/2015 10:08   Dg Op Swallowing Func-medicare/speech Path  05/13/2015  Modified Barium Swallow (see SLP report following report from Dr. Maryland Pink,  Radiologist present during assessment). CLINICAL DATA:  Difficulty swallowing EXAM: MODIFIED BARIUM SWALLOW TECHNIQUE: Different consistencies of barium were administered orally to the patient by the Speech Pathologist. Imaging of the pharynx was performed in the lateral projection. FLUOROSCOPY TIME:  Fluoroscopy Time:  1 minutes 10 seconds Number of Acquired Images:  15 COMPARISON:  Esophagram dated 02/10/2015 FINDINGS: Administration of thin and nectar thick liquids were within normal limits per Speech pathology. Please refer to their notes for additional information. However, when scanning down to the esophagus, severe esophageal dysmotility was noted. Given the patient's history, this appearance suggests secondary achalasia. The appearance is similar to the prior esophagram. 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 Please refer to the Speech Pathologists report for complete details and recommendations. Electronically Signed   By: Julian Hy M.D.   On: 05/13/2015 10:57 Objective Swallowing Evaluation:   [name] MRN: [id number] Date of Birth: 1933-10-24 Today's Date: 05/13/2015 Time: SLP Start Time (ACUTE ONLY): 1000-SLP Stop Time (ACUTE ONLY): 1034 SLP Time Calculation (min) (ACUTE ONLY): 34 min Past Medical History: Past Medical History Diagnosis Date . GERD (gastroesophageal reflux disease)  . Coronary artery disease    History of remote anterior MI with PCI to LAD in 2006; most recent cath 2007, no intervention required . Chronic systolic dysfunction of left ventricle    EF 30-35%, CLASS II - III SYMPTOMS; intolerant to Coreg . Hyperlipidemia  . PVC's (premature ventricular contractions)  . BPH (benign prostatic hyperplasia)  . CHF (congestive heart failure) ([place])  . COPD (chronic obstructive pulmonary disease) ([place])  . Heart murmur    hx . Pneumonia "several times"   aspiration pna with at least 3 admits for this 2016.  . Type II diabetes  mellitus ([place])  . CKD (chronic kidney disease) stage 3, GFR 30-59 ml/min 06/08/2012 . Skin cancer "several"   "forearms; head" . Memory loss  . Anginal pain ([place])  . Dysrhythmia  . Myocardial infarction ([place]) 1994   ANTERIOR . AICD (automatic cardioverter/defibrillator) present 03/30/2011   Analyze ST study patient . DOE (dyspnea on exertion)    with heavy exertion Past Surgical History: Past Surgical History Procedure Laterality Date . Foot fracture surgery Right 1980's . Cholecystectomy  2/11 . Cataract extraction w/ intraocular lens  implant, bilateral  08/2014-09/2014 . Cardiac catheterization  01/11/2006   DEMONSTRATES AKINESIA OF THE DISTAL ANTERIOR WALL, DISTAL INFERIOR WALL AND AKINESIA OF THE APEX. THE BASAL SEGMENTS CONTRACT WELL AND OVERALL EF 35% . Coronary angioplasty  1994   TO THE LAD . Skin cancer excision  "several"   "forearms, head" . Esophageal manometry N/A 03/16/2015   Procedure: ESOPHAGEAL MANOMETRY (EM);  Surgeon: Jerene Bears, MD;  Location: [facility];  Service: Gastroenterology;  Laterality: N/A; . Insert / replace / remove pacemaker   . Esophagogastroduodenoscopy N/A 04/08/2015   Procedure: ESOPHAGOGASTRODUODENOSCOPY (EGD);  Surgeon: Milus Banister, MD;  Location:  [facility];  Service: Endoscopy;  Laterality: N/A; . Botox injection N/A 04/08/2015   Procedure: BOTOX INJECTION;  Surgeon: Milus Banister, MD;  Location: [place];  Service: Endoscopy;  Laterality: N/A; HPI: Other Pertinent Information: Pt is a 81 year old male with multiple prior admissions for aspiration pna due to achalasia and esophageal dysphagia. Aspiration risk has been high when pt swallowing any solids. Pt had botox injections to with GE junction on 04/08/15 and was upgraded to regular solids and thin liquids last admission. He is currently admitted with fever, dyspnea. His CXR shows Right lower lobe infiltrate consistent with pneumonia. Pt denies any difficulty swallowing since botox injection.  No Data Recorded  Assessment / Plan / Recommendation CHL IP CLINICAL IMPRESSIONS 05/13/2015 Therapy Diagnosis Suspected primary esophageal dysphagia Clinical Impression Pt demonstrates normal oral and oropharyngeal dysphagia. After giving trials of puree, esophageal sweep completed showing severe stasis with appearance of random, diffuse spasms. Radiologist was called in and Dr. [place] Pink was very helpful in explaining secondary achalasia and diffuse esophageal dysmotility. Pt and son were educated to findings. Overall the pt appears to have high risk of aspiration due to a persistent esophageal dysphagia. Despite treatment to stricture at GI junction, there is severe dysmotility, particularly with solids. The pt will need to discuss these findings with his gastroenterologist. For now, the pt may elect to continue a mechanical soft diet with thin liquids with risk, also implementing esophageal precautions. Sleeping at a 30 degree incline may be particularly helpful. If he wanted to be more conservative he could consider a full liquid diet, though he seems reluctant to do this as meals are important to his quality of life. All education complete at this time, will sign off.    CHL IP TREATMENT RECOMMENDATION 05/13/2015 Treatment Recommendations No treatment recommended at this time   CHL IP DIET RECOMMENDATION 05/13/2015 SLP Diet Recommendations Thin Liquid Administration via (None) Medication Administration Whole meds with liquid Compensations Small sips/bites;Slow rate;Follow solids with liquid Postural Changes and/or Swallow Maneuvers (None)   CHL IP OTHER RECOMMENDATIONS 05/13/2015 Recommended Consults Consider GI evaluation Oral Care Recommendations Oral care BID Other Recommendations Have oral suction available;Clarify dietary restrictions   CHL IP FOLLOW UP RECOMMENDATIONS 04/10/2015 Follow up Recommendations None     Pertinent Vitals/Pain NA  SLP Swallow Goals No flowsheet data found. No flowsheet data found.   No flowsheet data  found.      No flowsheet data found.   Herbie Baltimore, [place] CCC-SLP [phone] Lynann Beaver 05/13/2015, 11:30 AM    Jalee Saine Cletis Media, MD 05/14/2015, 9:40 PM PGY-1, [place] Intern pager: [phone], text pages welcome

## 2015-05-14 NOTE — Progress Notes (Signed)
Pt transferred to 6E25 per order via bed. Daughter at bedside and aware of new room assignment. Report called to Cathie, RN. CCMD notified of new bed assignment, vitals stable at time of transfer

## 2015-05-14 NOTE — Progress Notes (Signed)
Family Medicine Teaching Service Daily Progress Note Intern Pager: [phone]  Patient name: Antonio Ray Medical record number: [id number] Date of birth: 1934/02/20 Age: 81 y.o. Gender: male  Primary Care Provider: [name], MD Consultants: None  Code Status: Full  Pt Overview and Major Events to Date:   Assessment and Plan: Antonio Ray is a 81 y.o. male presenting with dyspnea. PMH is significant for COPD, Multiple episodes of PNA, MI, HFrEF s/p AICD, TDM2, CAD  Pneumonia:  Patient feels good, denies any complaints this AM. Vitals Pulse ox 91% on room air, RR 22, pulses 80 BP 111/58. physical exam: noted to have slight left lower lobe crackles on exam.  Hx of recurrent pneumonia 5 since June 2016. CXR Right lower lobe infiltrate consistent with pneumonia.  Leukocytosis of 21.5 initially. WBC trending down.  Afebrile now - Switch from IV Vanc/Zosyn for pneumonia to PO Levaquin - F/u blood/urine cultures, NG x 2 days - Continue to monitor vitals    Achalasia  -S/P Botox injection 04/08/15 -Follow-up with Dr. Zenovia Ray (GI) on 06/11/2015  - Follow up swallow study   CAD: PCI to LAD 2006. follow-up cardiac cath 2007 with no intervention required. Myoview in 2011 low risk. No stents placed.  Troponin neg x 3. EKG unchanged from previous.  - continue nitroglycerin patch daily - continue pravastatin and fenofibrate  HFrEF/AICD: last EF of 10-15% in September 2016. Lower extremity Edema 2+ ankle edema, no shortness of breath. Pent has an AICD in place. Discharge weight from last admission 83.6 kg (184 pounds)--> Currently 193lbs.  - Continue Lasix 40 BID - DC telemetry.   AKI on CKD3: Scr 1.70>>1.81, Baseline Scr -1.5. Patient not likely hypovolemic - Will continue to follow  - No IVF at this time   Diabetes type 2: on insulin - Continue Lantus 10u daily - sensitive SSI with qHS   GERD - continue protonix  Cognitive impairment - continue home Namenda and  Aricept  FEN/GI: Carb/heart healthy Prophylaxis: Lovenox  Disposition: Home tommorrow  Subjective:  Patient states that he is doing well this morning. SOB has improved since yesterday. No complaints of pain. Patient is eating breakfast.   Objective: Temp:  [98.1 F (36.7 C)-98.9 F (37.2 C)] 98.2 F (36.8 C) (10/13 0910) Pulse Rate:  [58-76] 73 (10/13 0910) Resp:  [19-25] 19 (10/13 0910) BP: (107-128)/(45-63) 128/62 mmHg (10/13 0910) SpO2:  [93 %-98 %] 98 % (10/13 0910) Weight:  [193 lb 2 oz (87.6 kg)] 193 lb 2 oz (87.6 kg) (10/12 2214) Physical Exam: General: Sitting in Chair, NAD Cardiovascular: RRR, no murmurs Respiratory: CTAB, slightly diminished breath sounds. No crackles appreciated Abdomen: BS+, non-tender, non-distended  Extremities:  2+ pitting edema in ankles  Laboratory:  Recent Labs Lab 05/12/15 0853 05/13/15 0400 05/14/15 1036  WBC 21.5* 16.8* 10.1  HGB 12.2* 10.4* 11.4*  HCT 40.1 34.4* 36.9*  PLT 244 191 191    Recent Labs Lab 05/12/15 0853 05/13/15 0400 05/14/15 0640  NA 141 139 142  K 4.2 4.3 3.8  CL 103 104 103  CO2 26 29 28   BUN 41* 32* 24*  CREATININE 1.78* 1.70* 1.81*  CALCIUM 9.3 8.3* 8.8*  PROT 6.5  --   --   BILITOT 0.5  --   --   ALKPHOS 37*  --   --   ALT 20  --   --   AST 29  --   --   GLUCOSE 174* 115* 133*    Imaging/Diagnostic  Tests: Dg Chest 2 View  05/12/2015  CLINICAL DATA:  Short of breath.  Fever. EXAM: CHEST  2 VIEW COMPARISON:  04/06/2015 FINDINGS: New area of infiltrate in the right lower lobe, probable pneumonia. Interval clearing of bibasilar atelectasis and left effusion since prior study, these findings were felt to be due to heart failure at that time. No heart failure on today's study. Dual lead pacemaker noted. COPD with hyperinflation. IMPRESSION: Right lower lobe infiltrate consistent with pneumonia Clearing of heart failure with bibasilar atelectasis compared with the prior study. Electronically Signed    By: Antonio Ray M.D.   On: 05/12/2015 10:08   Dg Op Swallowing Func-medicare/speech Path  05/13/2015  Modified Barium Swallow (see SLP report following report from Dr. Maryland Ray, Radiologist present during assessment). CLINICAL DATA:  Difficulty swallowing EXAM: MODIFIED BARIUM SWALLOW TECHNIQUE: Different consistencies of barium were administered orally to the patient by the Speech Pathologist. Imaging of the pharynx was performed in the lateral projection. FLUOROSCOPY TIME:  Fluoroscopy Time:  1 minutes 10 seconds Number of Acquired Images:  15 COMPARISON:  Esophagram dated 02/10/2015 FINDINGS: Administration of thin and nectar thick liquids were within normal limits per Speech pathology. Please refer to their notes for additional information. However, when scanning down to the esophagus, severe esophageal dysmotility was noted. Given the patient's history, this appearance suggests secondary achalasia. The appearance is similar to the prior esophagram. 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 Please refer to the Speech Pathologists report for complete details and recommendations. Electronically Signed   By: Antonio Ray M.D.   On: 05/13/2015 10:57 Objective Swallowing Evaluation:   [name] MRN: [id number] Date of Birth: 10-08-1933 Today's Date: 05/13/2015 Time: SLP Start Time (ACUTE ONLY): 1000-SLP Stop Time (ACUTE ONLY): 1034 SLP Time Calculation (min) (ACUTE ONLY): 34 min Past Medical History: Past Medical History Diagnosis Date . GERD (gastroesophageal reflux disease)  . Coronary artery disease    History of remote anterior MI with PCI to LAD in 2006; most recent cath 2007, no intervention required . Chronic systolic dysfunction of left ventricle    EF 30-35%, CLASS II - III SYMPTOMS; intolerant to Coreg . Hyperlipidemia  . PVC's (premature ventricular contractions)  . BPH (benign prostatic hyperplasia)  . CHF (congestive heart failure)  ([place])  . COPD (chronic obstructive pulmonary disease) ([place])  . Heart murmur    hx . Pneumonia "several times"   aspiration pna with at least 3 admits for this 2016.  . Type II diabetes mellitus ([place])  . CKD (chronic kidney disease) stage 3, GFR 30-59 ml/min 06/08/2012 . Skin cancer "several"   "forearms; head" . Memory loss  . Anginal pain ([place])  . Dysrhythmia  . Myocardial infarction ([place]) 1994   ANTERIOR . AICD (automatic cardioverter/defibrillator) present 03/30/2011   Analyze ST study patient . DOE (dyspnea on exertion)    with heavy exertion Past Surgical History: Past Surgical History Procedure Laterality Date . Foot fracture surgery Right 1980's . Cholecystectomy  2/11 . Cataract extraction w/ intraocular lens  implant, bilateral  08/2014-09/2014 . Cardiac catheterization  01/11/2006   DEMONSTRATES AKINESIA OF THE DISTAL ANTERIOR WALL, DISTAL INFERIOR WALL AND AKINESIA OF THE APEX. THE BASAL SEGMENTS CONTRACT WELL AND OVERALL EF 35% . Coronary angioplasty  1994   TO THE LAD . Skin cancer excision  "several"   "forearms, head" . Esophageal manometry N/A 03/16/2015   Procedure: ESOPHAGEAL MANOMETRY (EM);  Surgeon: Jerene Bears, MD;  Location: [facility];  Service: Gastroenterology;  Laterality: N/A; . Insert / replace / remove pacemaker   . Esophagogastroduodenoscopy N/A 04/08/2015   Procedure: ESOPHAGOGASTRODUODENOSCOPY (EGD);  Surgeon: Milus Banister, MD;  Location: [facility];  Service: Endoscopy;  Laterality: N/A; . Botox injection N/A 04/08/2015   Procedure: BOTOX INJECTION;  Surgeon: Milus Banister, MD;  Location: [facility];  Service: Endoscopy;  Laterality: N/A; HPI: Other Pertinent Information: Pt is a 82 year old male with multiple prior admissions for aspiration pna due to achalasia and esophageal dysphagia. Aspiration risk has been high when pt swallowing any solids. Pt had botox injections to with GE junction on 04/08/15 and was upgraded to regular solids and thin liquids last admission. He is  currently admitted with fever, dyspnea. His CXR shows Right lower lobe infiltrate consistent with pneumonia. Pt denies any difficulty swallowing since botox injection.  No Data Recorded Assessment / Plan / Recommendation CHL IP CLINICAL IMPRESSIONS 05/13/2015 Therapy Diagnosis Suspected primary esophageal dysphagia Clinical Impression Pt demonstrates normal oral and oropharyngeal dysphagia. After giving trials of puree, esophageal sweep completed showing severe stasis with appearance of random, diffuse spasms. Radiologist was called in and Dr. Maryland Ray was very helpful in explaining secondary achalasia and diffuse esophageal dysmotility. Pt and son were educated to findings. Overall the pt appears to have high risk of aspiration due to a persistent esophageal dysphagia. Despite treatment to stricture at GI junction, there is severe dysmotility, particularly with solids. The pt will need to discuss these findings with his gastroenterologist. For now, the pt may elect to continue a mechanical soft diet with thin liquids with risk, also implementing esophageal precautions. Sleeping at a 30 degree incline may be particularly helpful. If he wanted to be more conservative he could consider a full liquid diet, though he seems reluctant to do this as meals are important to his quality of life. All education complete at this time, will sign off.    CHL IP TREATMENT RECOMMENDATION 05/13/2015 Treatment Recommendations No treatment recommended at this time   CHL IP DIET RECOMMENDATION 05/13/2015 SLP Diet Recommendations Thin Liquid Administration via (None) Medication Administration Whole meds with liquid Compensations Small sips/bites;Slow rate;Follow solids with liquid Postural Changes and/or Swallow Maneuvers (None)   CHL IP OTHER RECOMMENDATIONS 05/13/2015 Recommended Consults Consider GI evaluation Oral Care Recommendations Oral care BID Other Recommendations Have oral suction available;Clarify dietary restrictions   CHL  IP FOLLOW UP RECOMMENDATIONS 04/10/2015 Follow up Recommendations None     Pertinent Vitals/Pain NA  SLP Swallow Goals No flowsheet data found. No flowsheet data found.   No flowsheet data found.      No flowsheet data found.   Herbie [place], [place] CCC-SLP [phone] Lynann Beaver 05/13/2015, 11:30 AM    Carlyle Dolly, MD 05/14/2015, 9:36 AM PGY-1, [place] Intern pager: [phone], text pages welcome

## 2015-05-15 LAB — BASIC METABOLIC PANEL
Anion gap: 11 (ref 5–15)
BUN: 26 mg/dL — ABNORMAL HIGH (ref 6–20)
CHLORIDE: 97 mmol/L — AB (ref 101–111)
CO2: 29 mmol/L (ref 22–32)
CREATININE: 1.9 mg/dL — AB (ref 0.61–1.24)
Calcium: 9.1 mg/dL (ref 8.9–10.3)
GFR, EST AFRICAN AMERICAN: 37 mL/min — AB (ref >60)
GFR, EST NON AFRICAN AMERICAN: 32 mL/min — AB (ref >60)
Glucose, Bld: 129 mg/dL — ABNORMAL HIGH (ref 65–99)
Potassium: 4 mmol/L (ref 3.5–5.1)
SODIUM: 137 mmol/L (ref 135–145)

## 2015-05-15 LAB — CBC
HCT: 38.5 % — ABNORMAL LOW (ref 39.0–52.0)
HEMOGLOBIN: 11.9 g/dL — AB (ref 13.0–17.0)
MCH: 26.9 pg (ref 26.0–34.0)
MCHC: 30.9 g/dL (ref 30.0–36.0)
MCV: 87.1 fL (ref 78.0–100.0)
Platelets: 227 10*3/uL (ref 150–400)
RBC: 4.42 MIL/uL (ref 4.22–5.81)
RDW: 15.8 % — ABNORMAL HIGH (ref 11.5–15.5)
WBC: 9.3 10*3/uL (ref 4.0–10.5)

## 2015-05-15 LAB — GLUCOSE, CAPILLARY
GLUCOSE-CAPILLARY: 150 mg/dL — AB (ref 65–99)
GLUCOSE-CAPILLARY: 168 mg/dL — AB (ref 65–99)
Glucose-Capillary: 176 mg/dL — ABNORMAL HIGH (ref 65–99)

## 2015-05-15 MED ORDER — FUROSEMIDE 20 MG PO TABS: 40.0000 mg | ORAL_TABLET | Freq: Every day | ORAL | Status: DC

## 2015-05-15 MED ORDER — FUROSEMIDE 40 MG PO TABS: 40.0000 mg | ORAL_TABLET | Freq: Two times a day (BID) | ORAL | Status: DC

## 2015-05-15 MED ORDER — INSULIN GLARGINE 100 UNIT/ML ~~LOC~~ SOLN: 10.0000 [IU] | Freq: Every morning | SUBCUTANEOUS | Status: DC

## 2015-05-15 MED ORDER — LEVOFLOXACIN 750 MG PO TABS: 750.0000 mg | ORAL_TABLET | ORAL | Status: DC

## 2015-05-15 MED ORDER — LEVOFLOXACIN 750 MG PO TABS: 750.0000 mg | ORAL_TABLET | ORAL | Status: AC

## 2015-05-15 NOTE — Progress Notes (Signed)
Inpatient Diabetes Program Recommendations  AACE/ADA: New Consensus Statement on Inpatient Glycemic Control (2015)  Target Ranges:  Prepandial:   less than 140 mg/dL      Peak postprandial:   less than 180 mg/dL (1-2 hours)      Critically ill patients:  140 - 180 mg/dL   Review of Glycemic Control  Diabetes history: DM 2 Outpatient Diabetes medications: Lantus 18 units Daily Current orders for Inpatient glycemic control: Lantus 10 units Daily, Novolog Sensitive + HS  Inpatient Diabetes Program Recommendations: Correction (SSI): Glucose still ocassionally in the 200's. Please consider increasing correction to Novolog Moderate TID.    Thanks,  Tama Headings RN, MSN, [facility] Inpatient Diabetes Coordinator Team Pager [phone] (8a-5p)

## 2015-05-15 NOTE — Care Management Important Message (Signed)
Important Message  Patient Details  Name: Antonio Ray MRN: [id number] Date of Birth: [birth year]   Medicare Important Message Given:  Yes-second notification given    Delorse Lek 05/15/2015, 11:11 AM

## 2015-05-15 NOTE — Progress Notes (Signed)
Physical Therapy Treatment [name] MRN: [id number] DOB: 11-Dec-1933 Today's Date: 05/15/2015    History of Present Illness Pt is a 81 y/o male with PMH COPD, CAD, HFpEF s/p AICD, T2DM, and multiple recent admissions for PNA presents with acute dyspnea and pneumonia.  Pt was intubed 9/1-9/3 during recent hospital admission 2/2 acute respiratory failure.      PT Comments    Making good progress towards functional goals. Safely completed stair training and ambulating up to 300 feet today. States he feels near his baseline with mobility. See general comments below for SpO2 during therapy. Adequate for d/c from PT standpoint when medically ready.  Follow Up Recommendations  Outpatient PT (to address balance impairments)     Equipment Recommendations  None recommended by PT    Recommendations for Other Services       Precautions / Restrictions Precautions Precautions: Fall Restrictions Weight Bearing Restrictions: No    Mobility  Bed Mobility               General bed mobility comments: Pt in chair at start and end of PT session  Transfers Overall transfer level: Needs assistance Equipment used: None Transfers: Sit to/from Stand Sit to Stand: Modified independent (Device/Increase time)         General transfer comment: Good control, hand placement, and safety with this task. No assist needed. Performed from recliner x5  Ambulation/Gait Ambulation/Gait assistance: Supervision Ambulation Distance (Feet): 300 Feet Assistive device: None Gait Pattern/deviations: Step-through pattern;Drifts right/left   Gait velocity interpretation: at or above normal speed for age/gender General Gait Details: Supervision for safety without assistive device. Occasionally reaches for rail with minimal drift/sway noted. Cues for pursed lip breathing and energy conservation techniques.   Stairs Stairs: Yes Stairs assistance: Supervision Stair Management:  One rail Right;Step to pattern;Forwards;Alternating pattern Number of Stairs: 8 General stair comments: Cues for technique and safety. Able to alternate steps going up, and uses step-to approach on descent. No physical assist required.  Wheelchair Mobility    Modified Rankin (Stroke Patients Only)       Balance                                    Cognition Arousal/Alertness: Awake/alert Behavior During Therapy: WFL for tasks assessed/performed Overall Cognitive Status: History of cognitive impairments - at baseline (Mild dementia)       Memory: Decreased short-term memory              Exercises General Exercises - Lower Extremity Ankle Circles/Pumps: AROM;Both;10 reps;Seated Long Arc Quad: Both;Seated;Strengthening;10 reps Hip Flexion/Marching: Both;Seated;Strengthening;10 reps    General Comments General comments (skin integrity, edema, etc.): SpO2 95% on room air at rest. SpO2 indicated 86% while ambulating on room air but with poor waveform and minimal dyspnea. Immediately upon sitting SpO2 95-96%.      Pertinent Vitals/Pain Pain Assessment: No/denies pain    Home Living                      Prior Function            PT Goals (current goals can now be found in the care plan section) Acute Rehab PT Goals Patient Stated Goal: to remain strong and independent PT Goal Formulation: With patient Time For Goal Achievement: 05/27/15 Potential to Achieve Goals: Good Progress towards PT goals: Progressing toward goals    Frequency  Min 3X/week    PT Plan Current plan remains appropriate    Co-evaluation             End of Session Equipment Utilized During Treatment: Gait belt Activity Tolerance: Patient tolerated treatment well Patient left: in chair;with call bell/phone within reach     Time: 9201-0071 PT Time Calculation (min) (ACUTE ONLY): 14 min  Charges:  $Gait Training: 8-22 mins                    G Codes:       Ellouise Newer May 30, 2015, 4:56 PM Camille Bal Gann, [place]

## 2015-05-15 NOTE — Progress Notes (Signed)
Patient Discharge: Disposition: Patient discharged to home with daughter. Education: Reviewed medications, prescriptions, follow-up appointments, discharge instructions and also given hand out on Pneumonia, understood and acknowledged. IV: Discontinued before discharge. Telemetry: NA Transportation: Patient transported in w/c with staff and daughter accompanying him out of the unit. Belongings: Patient took all his belongings with him.

## 2015-05-15 NOTE — Discharge Summary (Signed)
[facility] Discharge Summary  Patient name: Antonio Ray Medical record number: [id number] Date of birth: 1933/09/28 Age: 81 y.o. Gender: male Date of Admission: 05/12/2015  Date of Discharge: 05/16/2015 Admitting Physician: Lupita Dawn, MD  Primary Care Provider: [name], MD Consultants: None  Indication for Hospitalization: Pneumonia    Discharge Diagnoses/Problem List:  HFrEF/AICD  AKI on CKD3  Achalasia  CAD T2DM GERD Cognitive Impairment   Disposition: Home   Discharge Condition: Stable   Discharge Exam:  General: Sitting in Chair, NAD Cardiovascular: RRR, no murmurs Respiratory: CTAB,  Abdomen: BS+, non-tender, non-distended  Extremities: 2+ pitting edema to [facility]   Brief Hospital Course:  Patient was admitted for treatment of a right lower lobe pneumonia in the setting of multiple recent hospitalizations for pneumonia, with last admission in September requiring intubation. Patient was started on vancomycin and zosyn. Blood cultures over the next 48 hours were found to be negative and patient was transitioned to Levaquin. Patient's vitals remained stable throughout stay, patient remained afebrile. Leukocytosis trended down. Patient was found to be slightly  fluid overload on exam and was appropriately diuresised.  Issues for Follow Up:  1. Consider changing Pravachol to Lipitor for high intensity coverage, if there is no previous reactions  2. Patient to continue Levaquin for at total of 4 more days, every 48 hours on 10/15 and 10/17.  3. Patient's insulin dosing was modified to 10 units, as patient was well controlled on a lower dose of insulin at discharge, can increase per patient's requirements  4. Patient was sent home with instructions to take 80 mg of lasix until 10/18 and then return back to 40 mg daily to ensure patient was not fluid overloaded, please check for fluid overload in clinic and treat as appropriately.    Significant Procedures: None   Significant Labs and Imaging:   Recent Labs Lab 05/13/15 0400 05/14/15 1036 05/15/15 0505  WBC 16.8* 10.1 9.3  HGB 10.4* 11.4* 11.9*  HCT 34.4* 36.9* 38.5*  PLT 191 191 227    Recent Labs Lab 05/12/15 0853 05/13/15 0400 05/14/15 0640 05/15/15 0505  NA 141 139 142 137  K 4.2 4.3 3.8 4.0  CL 103 104 103 97*  CO2 26 29 28 29   GLUCOSE 174* 115* 133* 129*  BUN 41* 32* 24* 26*  CREATININE 1.78* 1.70* 1.81* 1.90*  CALCIUM 9.3 8.3* 8.8* 9.1  ALKPHOS 37*  --   --   --   AST 29  --   --   --   ALT 20  --   --   --   ALBUMIN 3.4*  --   --   --     Results/Tests Pending at Time of Discharge: None   Discharge Medications:    Medication List    TAKE these medications        aspirin 81 MG EC tablet  Take 81 mg by mouth daily.     budesonide-formoterol 160-4.5 MCG/ACT inhaler  Commonly known as:  SYMBICORT  Inhale 2 puffs into the lungs 2 (two) times daily.     donepezil 10 MG tablet  Commonly known as:  ARICEPT  Take 10 mg by mouth every morning.     fenofibrate 145 MG tablet  Commonly known as:  TRICOR  TAKE 1 TABLET BY MOUTH EVERY DAY     furosemide 20 MG tablet  Commonly known as:  LASIX  Take 2 tablets (40 mg total) by mouth daily.  insulin glargine 100 UNIT/ML injection  Commonly known as:  LANTUS  Inject 0.1 mLs (10 Units total) into the skin every morning.     levofloxacin 750 MG tablet  Commonly known as:  LEVAQUIN  Take 1 tablet (750 mg total) by mouth every other day. Take one tablet on 10/15 and 10/17     losartan 25 MG tablet  Commonly known as:  COZAAR  Take 25 mg by mouth daily.     memantine 10 MG tablet  Commonly known as:  NAMENDA  Take 10 mg by mouth 2 (two) times daily.     nitroGLYCERIN 0.2 mg/hr patch  Commonly known as:  NITRODUR - Dosed in mg/24 hr  Place 1 patch (0.2 mg total) onto the skin daily.     pantoprazole 20 MG tablet  Commonly known as:  PROTONIX  TAKE 1 TABLET BY MOUTH EVERY  DAY     pantoprazole 20 MG tablet  Commonly known as:  PROTONIX  TAKE 1 TABLET BY MOUTH DAILY     pravastatin 40 MG tablet  Commonly known as:  PRAVACHOL  TAKE 1 TABLET BY MOUTH EVERY EVENING     PRESERVISION AREDS 2 Caps  Take 1 capsule by mouth 2 (two) times daily.     PROVENTIL HFA 108 (90 BASE) MCG/ACT inhaler  Generic drug:  albuterol  Inhale 2 puffs into the lungs every 6 (six) hours as needed for wheezing or shortness of breath. As directed     tiotropium 18 MCG inhalation capsule  Commonly known as:  SPIRIVA  Place 1 capsule (18 mcg total) into inhaler and inhale daily.     VITAMIN D PO  Take 2,000 Units by mouth daily.        Discharge Instructions: Please refer to Patient Instructions section of EMR for full details.  Patient was counseled important signs and symptoms that should prompt return to medical care, changes in medications, dietary instructions, activity restrictions, and follow up appointments.   Follow-Up Appointments: Follow-up Information    Follow up with [name], MD. Schedule an appointment as soon as possible for a visit in 5 days.   Specialty:  Family Medicine   Why:  Hospital follow-up   Contact information:   [address] [address] [phone]       Tonette Bihari, MD 05/16/2015, 10:58 PM PGY-1, [place]

## 2015-05-15 NOTE — Discharge Instructions (Addendum)
You were admitted and treated for pneumonia, this is like due in part to your achalasia. I will give you two mores doses of Levofloxacin. Please take these pills on  10/15 and 10/17 only, due to your kidney function you will only need this medication every 48 hours. Please take only 10 units of Lantus, do not go back to home dose of 18 units as blood sugars in the hospital were well controlled on 10 units.  Please take 4 pills of lasix for a total of 80 mg of lasix until 10/18, and then you can resume your 40 mg or 2  tablets of lasix daily therafter  Please follow up with your primary care physician within a week.    SEEK MEDICAL CARE IF:   You develop worsening shortness of breath, wheezing, or difficulty breathing.   You develop a fever.   You have chest pain.    Follow-up Information    Follow up with [name], MD. Schedule an appointment as soon as possible for a visit in 5 days.   Specialty:  Family Medicine   Why:  Hospital follow-up   Contact information:   [address] [place] [address] [phone]

## 2015-05-17 LAB — CULTURE, BLOOD (ROUTINE X 2)
Culture: NO GROWTH
Culture: NO GROWTH

## 2015-05-20 DIAGNOSIS — S31104D Unspecified open wound of abdominal wall, left lower quadrant without penetration into peritoneal cavity, subsequent encounter: Secondary | ICD-10-CM | POA: Diagnosis not present

## 2015-05-20 DIAGNOSIS — E1122 Type 2 diabetes mellitus with diabetic chronic kidney disease: Secondary | ICD-10-CM | POA: Diagnosis not present

## 2015-05-20 DIAGNOSIS — J440 Chronic obstructive pulmonary disease with acute lower respiratory infection: Secondary | ICD-10-CM | POA: Diagnosis not present

## 2015-05-20 DIAGNOSIS — I5022 Chronic systolic (congestive) heart failure: Secondary | ICD-10-CM | POA: Diagnosis not present

## 2015-05-20 DIAGNOSIS — J441 Chronic obstructive pulmonary disease with (acute) exacerbation: Secondary | ICD-10-CM | POA: Diagnosis not present

## 2015-05-20 DIAGNOSIS — J189 Pneumonia, unspecified organism: Secondary | ICD-10-CM | POA: Diagnosis not present

## 2015-05-22 ENCOUNTER — Encounter: Payer: Self-pay | Admitting: *Deleted

## 2015-05-25 ENCOUNTER — Other Ambulatory Visit: Payer: Self-pay | Admitting: Pulmonary Disease

## 2015-05-27 DIAGNOSIS — Z23 Encounter for immunization: Secondary | ICD-10-CM | POA: Diagnosis not present

## 2015-05-27 DIAGNOSIS — I5022 Chronic systolic (congestive) heart failure: Secondary | ICD-10-CM | POA: Diagnosis not present

## 2015-05-27 DIAGNOSIS — J189 Pneumonia, unspecified organism: Secondary | ICD-10-CM | POA: Diagnosis not present

## 2015-05-27 DIAGNOSIS — E1122 Type 2 diabetes mellitus with diabetic chronic kidney disease: Secondary | ICD-10-CM | POA: Diagnosis not present

## 2015-05-27 DIAGNOSIS — J441 Chronic obstructive pulmonary disease with (acute) exacerbation: Secondary | ICD-10-CM | POA: Diagnosis not present

## 2015-05-27 DIAGNOSIS — S31104D Unspecified open wound of abdominal wall, left lower quadrant without penetration into peritoneal cavity, subsequent encounter: Secondary | ICD-10-CM | POA: Diagnosis not present

## 2015-05-27 DIAGNOSIS — L309 Dermatitis, unspecified: Secondary | ICD-10-CM | POA: Diagnosis not present

## 2015-05-27 DIAGNOSIS — J440 Chronic obstructive pulmonary disease with acute lower respiratory infection: Secondary | ICD-10-CM | POA: Diagnosis not present

## 2015-06-09 DIAGNOSIS — E1122 Type 2 diabetes mellitus with diabetic chronic kidney disease: Secondary | ICD-10-CM | POA: Diagnosis not present

## 2015-06-09 DIAGNOSIS — J189 Pneumonia, unspecified organism: Secondary | ICD-10-CM | POA: Diagnosis not present

## 2015-06-09 DIAGNOSIS — J441 Chronic obstructive pulmonary disease with (acute) exacerbation: Secondary | ICD-10-CM | POA: Diagnosis not present

## 2015-06-09 DIAGNOSIS — S31104D Unspecified open wound of abdominal wall, left lower quadrant without penetration into peritoneal cavity, subsequent encounter: Secondary | ICD-10-CM | POA: Diagnosis not present

## 2015-06-09 DIAGNOSIS — I5022 Chronic systolic (congestive) heart failure: Secondary | ICD-10-CM | POA: Diagnosis not present

## 2015-06-09 DIAGNOSIS — J440 Chronic obstructive pulmonary disease with acute lower respiratory infection: Secondary | ICD-10-CM | POA: Diagnosis not present

## 2015-06-11 ENCOUNTER — Ambulatory Visit (INDEPENDENT_AMBULATORY_CARE_PROVIDER_SITE_OTHER): Payer: Medicare Other | Admitting: Internal Medicine

## 2015-06-11 ENCOUNTER — Encounter: Payer: Self-pay | Admitting: Internal Medicine

## 2015-06-11 VITALS — BP 100/60 | HR 80 | Ht 70.0 in | Wt 191.8 lb

## 2015-06-11 DIAGNOSIS — I255 Ischemic cardiomyopathy: Secondary | ICD-10-CM

## 2015-06-11 DIAGNOSIS — E1151 Type 2 diabetes mellitus with diabetic peripheral angiopathy without gangrene: Secondary | ICD-10-CM | POA: Diagnosis not present

## 2015-06-11 DIAGNOSIS — L603 Nail dystrophy: Secondary | ICD-10-CM | POA: Diagnosis not present

## 2015-06-11 DIAGNOSIS — I739 Peripheral vascular disease, unspecified: Secondary | ICD-10-CM | POA: Diagnosis not present

## 2015-06-11 DIAGNOSIS — K220 Achalasia of cardia: Secondary | ICD-10-CM

## 2015-06-11 NOTE — Patient Instructions (Signed)
Please continue Protonix.  We will contact your regarding possible treatment for your achalasia.

## 2015-06-11 NOTE — Progress Notes (Signed)
   Subjective:    Patient ID: Antonio Ray, male    DOB: 03/01/1934, 81 y.o.   MRN: [id number]  HPI Antonio Ray is a 81 year old male with history of achalasia status post Botox injection, GERD, aspiration pneumonia, CHF, CAD, COPD and chronic kidney disease he is here for follow-up. He's here today with his son. He was last seen in the office in August 2016 to discuss achalasia. Decision was made to arrange EGD with Botox to the LES. Unfortunately he was readmitted with an aspiration pneumonia and during this hospitalization had an upper endoscopy performed by Dr. Ardis Hughs. EGD performed on 04/08/2015 revealed smoothly narrowed GE junction with an otherwise normal-appearing upper endoscopy. Botox was injected in 4 quadrants.  He reports significant improvements in dysphagia symptoms after Botox injection. He's been able to eat much easier and food seems to go down better. He has less choking episodes. He has been back in the hospital briefly in October and treated with Levaquin for a left-sided pneumonia. Today he denies dyspnea, chest pain, fever or chills.  He is interested in further treatment as necessary when swallowing dysfunction returned  Review of Systems As per history of present illness, otherwise negative  Current Medications, Allergies, Past Medical History, Past Surgical History, Family History and Social History were reviewed in [employer] record.     Objective:   Physical Exam BP 100/60 mmHg  Pulse 80  Ht 5\' 10"  (1.778 m)  Wt 191 lb 12.8 oz (87 kg)  BMI 27.52 kg/m2 Constitutional: Well-developed and well-nourished. No distress. HEENT: Normocephalic and atraumatic.Conjunctivae are normal.  No scleral icterus. Neck: Neck supple. Trachea midline. Cardiovascular: Normal rate, regular rhythm and intact distal pulses.  Pulmonary/chest: Effort normal and breath sounds normal. No wheezing, rales or rhonchi. Abdominal: Soft, nontender, nondistended.  Bowel sounds active throughout.  Extremities: no clubbing, cyanosis, or edema Neurological: Alert and oriented to person place and time. Psychiatric: Normal mood and affect. Behavior is normal.     Assessment & Plan:  81 year old male with history of achalasia status post Botox injection, GERD, aspiration pneumonia, CHF, CAD, COPD and chronic kidney disease he is here for follow-up.  1. Achalasia -- very good response to Botox to the LES. Swallowing symptoms have improved dramatically. We discussed the importance of eating slowly, taking small bites and chewing very well. I recommended that he prop the head of his bed slightly at night. He will continue daily PPI. We discussed repeat Botox which can be offered but can also result in scarring and even stricturing. He may be a candidate for pneumatic dilation and I will discuss this with Dr. Silverio Decamp.  Pending her opinion, decision to be made regarding pneumatic dilation versus repeat Botox when symptoms return.  25 minutes spent with the patient and his son today

## 2015-06-12 NOTE — Progress Notes (Signed)
I can discuss it with patient regarding pneumatic dilation and would also like to discuss with CT surgery prior to scheduling for pneumatic dilation to make sure they are available for back up in case of a complication. Thanks

## 2015-06-17 ENCOUNTER — Telehealth: Payer: Self-pay | Admitting: Internal Medicine

## 2015-06-17 ENCOUNTER — Other Ambulatory Visit: Payer: Self-pay | Admitting: *Deleted

## 2015-06-17 ENCOUNTER — Other Ambulatory Visit: Payer: Self-pay | Admitting: Cardiology

## 2015-06-17 MED ORDER — PRAVASTATIN SODIUM 40 MG PO TABS: 40.0000 mg | ORAL_TABLET | Freq: Every evening | ORAL | Status: DC

## 2015-06-17 NOTE — Telephone Encounter (Signed)
Contacted [facility]. Patient is now scheduled for 07/30/15.

## 2015-06-17 NOTE — Telephone Encounter (Signed)
Rx request sent to pharmacy.  

## 2015-06-17 NOTE — Telephone Encounter (Signed)
I spoke to Antonio Ray today by phone I have discussed possible pneumatic dilation with Dr. Silverio Decamp She recommends face-to-face meeting with the patient and his son to discuss possible pneumatic dilation. This would be to also discuss risk versus benefit of pneumatic dilation given his age and medical comorbidities  Beth,  Please contact the patient's son, Antonio Ray, to schedule office visit with Dr. Silverio Decamp to discuss pneumatic dilation for his history of achalasia

## 2015-06-23 ENCOUNTER — Ambulatory Visit (INDEPENDENT_AMBULATORY_CARE_PROVIDER_SITE_OTHER): Payer: Medicare Other | Admitting: *Deleted

## 2015-06-23 DIAGNOSIS — I255 Ischemic cardiomyopathy: Secondary | ICD-10-CM | POA: Diagnosis not present

## 2015-06-23 NOTE — Progress Notes (Signed)
Remote ICD transmission.   

## 2015-06-25 ENCOUNTER — Other Ambulatory Visit: Payer: Self-pay | Admitting: Pulmonary Disease

## 2015-06-29 ENCOUNTER — Emergency Department (HOSPITAL_COMMUNITY): Payer: Medicare Other

## 2015-06-29 ENCOUNTER — Inpatient Hospital Stay (HOSPITAL_COMMUNITY)
Admission: EM | Admit: 2015-06-29 | Discharge: 2015-07-02 | DRG: 871 | Disposition: A | Discharge status: Home / Self Care | Payer: Medicare Other | Attending: Internal Medicine | Admitting: Internal Medicine

## 2015-06-29 ENCOUNTER — Encounter (HOSPITAL_COMMUNITY): Payer: Self-pay | Admitting: Internal Medicine

## 2015-06-29 DIAGNOSIS — J440 Chronic obstructive pulmonary disease with acute lower respiratory infection: Secondary | ICD-10-CM | POA: Diagnosis present

## 2015-06-29 DIAGNOSIS — I255 Ischemic cardiomyopathy: Secondary | ICD-10-CM | POA: Diagnosis present

## 2015-06-29 DIAGNOSIS — I5022 Chronic systolic (congestive) heart failure: Secondary | ICD-10-CM | POA: Diagnosis present

## 2015-06-29 DIAGNOSIS — R509 Fever, unspecified: Secondary | ICD-10-CM | POA: Diagnosis not present

## 2015-06-29 DIAGNOSIS — K219 Gastro-esophageal reflux disease without esophagitis: Secondary | ICD-10-CM | POA: Diagnosis present

## 2015-06-29 DIAGNOSIS — J441 Chronic obstructive pulmonary disease with (acute) exacerbation: Secondary | ICD-10-CM | POA: Diagnosis present

## 2015-06-29 DIAGNOSIS — I2510 Atherosclerotic heart disease of native coronary artery without angina pectoris: Secondary | ICD-10-CM | POA: Diagnosis present

## 2015-06-29 DIAGNOSIS — N179 Acute kidney failure, unspecified: Secondary | ICD-10-CM | POA: Diagnosis not present

## 2015-06-29 DIAGNOSIS — N189 Chronic kidney disease, unspecified: Secondary | ICD-10-CM

## 2015-06-29 DIAGNOSIS — Y95 Nosocomial condition: Secondary | ICD-10-CM | POA: Diagnosis present

## 2015-06-29 DIAGNOSIS — Z794 Long term (current) use of insulin: Secondary | ICD-10-CM

## 2015-06-29 DIAGNOSIS — Z87891 Personal history of nicotine dependence: Secondary | ICD-10-CM

## 2015-06-29 DIAGNOSIS — E1122 Type 2 diabetes mellitus with diabetic chronic kidney disease: Secondary | ICD-10-CM | POA: Diagnosis present

## 2015-06-29 DIAGNOSIS — E119 Type 2 diabetes mellitus without complications: Secondary | ICD-10-CM | POA: Diagnosis not present

## 2015-06-29 DIAGNOSIS — J189 Pneumonia, unspecified organism: Secondary | ICD-10-CM | POA: Diagnosis not present

## 2015-06-29 DIAGNOSIS — R413 Other amnesia: Secondary | ICD-10-CM | POA: Diagnosis present

## 2015-06-29 DIAGNOSIS — A419 Sepsis, unspecified organism: Principal | ICD-10-CM | POA: Diagnosis present

## 2015-06-29 DIAGNOSIS — I444 Left anterior fascicular block: Secondary | ICD-10-CM | POA: Diagnosis present

## 2015-06-29 DIAGNOSIS — Z95810 Presence of automatic (implantable) cardiac defibrillator: Secondary | ICD-10-CM | POA: Diagnosis not present

## 2015-06-29 DIAGNOSIS — Z7982 Long term (current) use of aspirin: Secondary | ICD-10-CM | POA: Diagnosis not present

## 2015-06-29 DIAGNOSIS — J111 Influenza due to unidentified influenza virus with other respiratory manifestations: Secondary | ICD-10-CM | POA: Diagnosis not present

## 2015-06-29 DIAGNOSIS — J690 Pneumonitis due to inhalation of food and vomit: Secondary | ICD-10-CM | POA: Diagnosis present

## 2015-06-29 DIAGNOSIS — K220 Achalasia of cardia: Secondary | ICD-10-CM | POA: Diagnosis not present

## 2015-06-29 DIAGNOSIS — R05 Cough: Secondary | ICD-10-CM | POA: Diagnosis not present

## 2015-06-29 DIAGNOSIS — Z9861 Coronary angioplasty status: Secondary | ICD-10-CM

## 2015-06-29 DIAGNOSIS — I252 Old myocardial infarction: Secondary | ICD-10-CM

## 2015-06-29 DIAGNOSIS — E785 Hyperlipidemia, unspecified: Secondary | ICD-10-CM | POA: Diagnosis present

## 2015-06-29 DIAGNOSIS — N183 Chronic kidney disease, stage 3 (moderate): Secondary | ICD-10-CM | POA: Diagnosis present

## 2015-06-29 DIAGNOSIS — I130 Hypertensive heart and chronic kidney disease with heart failure and stage 1 through stage 4 chronic kidney disease, or unspecified chronic kidney disease: Secondary | ICD-10-CM | POA: Diagnosis present

## 2015-06-29 LAB — CBC WITH DIFFERENTIAL/PLATELET
BASOS ABS: 0 10*3/uL (ref 0.0–0.1)
Basophils Relative: 0 %
EOS ABS: 0.2 10*3/uL (ref 0.0–0.7)
EOS PCT: 2 %
HCT: 37.1 % — ABNORMAL LOW (ref 39.0–52.0)
Hemoglobin: 11.2 g/dL — ABNORMAL LOW (ref 13.0–17.0)
LYMPHS PCT: 21 %
Lymphs Abs: 2.3 10*3/uL (ref 0.7–4.0)
MCH: 25.7 pg — AB (ref 26.0–34.0)
MCHC: 30.2 g/dL (ref 30.0–36.0)
MCV: 85.3 fL (ref 78.0–100.0)
MONO ABS: 0.8 10*3/uL (ref 0.1–1.0)
Monocytes Relative: 7 %
Neutro Abs: 7.7 10*3/uL (ref 1.7–7.7)
Neutrophils Relative %: 70 %
PLATELETS: 250 10*3/uL (ref 150–400)
RBC: 4.35 MIL/uL (ref 4.22–5.81)
RDW: 16.1 % — AB (ref 11.5–15.5)
WBC: 10.9 10*3/uL — AB (ref 4.0–10.5)

## 2015-06-29 LAB — URINALYSIS, ROUTINE W REFLEX MICROSCOPIC
BILIRUBIN URINE: NEGATIVE
Glucose, UA: 100 mg/dL — AB
HGB URINE DIPSTICK: NEGATIVE
Ketones, ur: NEGATIVE mg/dL
Leukocytes, UA: NEGATIVE
NITRITE: NEGATIVE
PH: 5 (ref 5.0–8.0)
Protein, ur: NEGATIVE mg/dL
SPECIFIC GRAVITY, URINE: 1.019 (ref 1.005–1.030)

## 2015-06-29 LAB — COMPREHENSIVE METABOLIC PANEL
ALK PHOS: 38 U/L (ref 38–126)
ALT: 17 U/L (ref 17–63)
AST: 21 U/L (ref 15–41)
Albumin: 3 g/dL — ABNORMAL LOW (ref 3.5–5.0)
Anion gap: 11 (ref 5–15)
BILIRUBIN TOTAL: 0.6 mg/dL (ref 0.3–1.2)
BUN: 50 mg/dL — AB (ref 6–20)
CO2: 24 mmol/L (ref 22–32)
CREATININE: 2.08 mg/dL — AB (ref 0.61–1.24)
Calcium: 9.1 mg/dL (ref 8.9–10.3)
Chloride: 104 mmol/L (ref 101–111)
GFR calc Af Amer: 33 mL/min — ABNORMAL LOW (ref >60)
GFR, EST NON AFRICAN AMERICAN: 29 mL/min — AB (ref >60)
Glucose, Bld: 208 mg/dL — ABNORMAL HIGH (ref 65–99)
Potassium: 4.1 mmol/L (ref 3.5–5.1)
Sodium: 139 mmol/L (ref 135–145)
TOTAL PROTEIN: 6.3 g/dL — AB (ref 6.5–8.1)

## 2015-06-29 LAB — BRAIN NATRIURETIC PEPTIDE: B Natriuretic Peptide: 104.3 pg/mL — ABNORMAL HIGH (ref 0.0–100.0)

## 2015-06-29 LAB — I-STAT TROPONIN, ED: TROPONIN I, POC: 0.01 ng/mL (ref 0.00–0.08)

## 2015-06-29 MED ORDER — IPRATROPIUM-ALBUTEROL 0.5-2.5 (3) MG/3ML IN SOLN
3.0000 mL | Freq: Once | RESPIRATORY_TRACT | Status: AC
Start: 2015-06-29 — End: 2015-06-29
  Administered 2015-06-29: 3 mL via RESPIRATORY_TRACT
  Filled 2015-06-29: qty 3

## 2015-06-29 MED ORDER — VANCOMYCIN HCL 10 G IV SOLR
1250.0000 mg | Freq: Once | INTRAVENOUS | Status: AC
  Administered 2015-06-29: 1250 mg via INTRAVENOUS
  Filled 2015-06-29: qty 1250

## 2015-06-29 MED ORDER — VANCOMYCIN HCL IN DEXTROSE 1-5 GM/200ML-% IV SOLN
1000.0000 mg | INTRAVENOUS | Status: DC
  Administered 2015-06-30 – 2015-07-01 (×2): 1000 mg via INTRAVENOUS
  Filled 2015-06-29 (×3): qty 200

## 2015-06-29 MED ORDER — PIPERACILLIN-TAZOBACTAM 3.375 G IVPB 30 MIN
3.3750 g | Freq: Once | INTRAVENOUS | Status: AC
  Administered 2015-06-29: 3.375 g via INTRAVENOUS
  Filled 2015-06-29: qty 50

## 2015-06-29 MED ORDER — LEVOFLOXACIN IN D5W 750 MG/150ML IV SOLN
750.0000 mg | Freq: Once | INTRAVENOUS | Status: DC
  Filled 2015-06-29: qty 150

## 2015-06-29 NOTE — Progress Notes (Signed)
ANTIBIOTIC CONSULT NOTE - INITIAL  Pharmacy Consult for vancomycin Indication: pneumonia  Allergies  Allergen Reactions  . Codeine Other (See Comments)    Gets very angry, disoriented  . Dilaudid [Hydromorphone Hcl] Other (See Comments)    VERY AGITATED, HOSTILE  . Flomax [Tamsulosin Hcl] Shortness Of Breath  . Morphine And Related Other (See Comments)    VERY AGITATED, HOSTILE  . Sulfa Antibiotics Shortness Of Breath  . Beta Adrenergic Blockers Other (See Comments)    ? disorientation  . Carvedilol Other (See Comments)    DISORIENTATION    Vital Signs: Temp: 99.7 F (37.6 C) (11/28 1856) Temp Source: Rectal (11/28 1856) BP: 121/57 mmHg (11/28 1930) Pulse Rate: 61 (11/28 1930) Intake/Output from previous day:   Intake/Output from this shift:    Labs:  Recent Labs  06/29/15 1845  WBC 10.9*  HGB 11.2*  PLT 250  CREATININE 2.08*   CrCl cannot be calculated (Unknown ideal weight.). No results for input(s): VANCOTROUGH, VANCOPEAK, VANCORANDOM, GENTTROUGH, GENTPEAK, GENTRANDOM, TOBRATROUGH, TOBRAPEAK, TOBRARND, AMIKACINPEAK, AMIKACINTROU, AMIKACIN in the last 72 hours.   Microbiology: No results found for this or any previous visit (from the past 720 hour(s)).  Medical History: Past Medical History  Diagnosis Date  . GERD (gastroesophageal reflux disease)   . Coronary artery disease     History of remote anterior MI with PCI to LAD in 2006; most recent cath 2007, no intervention required  . Chronic systolic dysfunction of left ventricle     EF 30-35%, CLASS II - III SYMPTOMS; intolerant to Coreg  . Hyperlipidemia   . PVC's (premature ventricular contractions)   . BPH (benign prostatic hyperplasia)   . CHF (congestive heart failure) ([place])   . COPD (chronic obstructive pulmonary disease) ([place])   . Heart murmur     hx  . Pneumonia "several times"    aspiration pna with at least 3 admits for this 2016.   . Type II diabetes mellitus ([place])   . CKD (chronic kidney  disease) stage 3, GFR 30-59 ml/min 06/08/2012  . Skin cancer "several"    "forearms; head"  . Memory loss   . Anginal pain ([place])   . Dysrhythmia   . Myocardial infarction ([place]) 1994    ANTERIOR  . AICD (automatic cardioverter/defibrillator) present 03/30/2011    Analyze ST study patient  . DOE (dyspnea on exertion)     with heavy exertion  . Achalasia   . Renal failure   . Esophageal dysmotility     Assessment: 81 yo m presenting to the ED on 11/28 with post nasal drainage, weakness, productive cough w/ white sputum.  Pt has h/o of pneumonia and sepsis.  Pharmacy is consulted to dose vancomycin for pneumonia.  Wbc 10.9, tmax 99.7, SCr 2.08 (1.90 in October 2016), CrCl ~30 ml/min.   Zosyn x 1 in the ED Vancomycin 11/28 >>  Goal of Therapy:  Vancomycin trough level 15-20 mcg/ml  Plan:  Vancomycin 1,250 mg IV load x 1 Vancomycin 1,000 mg IV q24h De-escalate when clinically reasonable Monitor renal fx, cbc, clinical course  Merric Yost L. Nicole Kindred, PharmD PGY2 Infectious Diseases Pharmacy Resident Pager: [phone] 06/29/2015 9:25 PM

## 2015-06-29 NOTE — ED Notes (Signed)
Pt in from home via [facility] per report pt has recently reported post nasal drainage with weakness, pt c/o productive cough with white thick sputum, hx of PNA regularly this year requiring intubation, pt hx of septis multiple times per family, pt denies pain, n/v/d, pt A&O x4

## 2015-06-29 NOTE — H&P (Signed)
PCP:  [name], MD [facility] [place]  Referring provider Alfonse Spruce   Chief Complaint:  Cough  HPI: Antonio Ray is a 81 y.o. male   has a past medical history of GERD (gastroesophageal reflux disease); Coronary artery disease; Chronic systolic dysfunction of left ventricle; Hyperlipidemia; PVC's (premature ventricular contractions); BPH (benign prostatic hyperplasia); CHF (congestive heart failure) ([place]); COPD (chronic obstructive pulmonary disease) ([place]); Heart murmur; Pneumonia ("several times"); Type II diabetes mellitus ([name]); CKD (chronic kidney disease) stage 3, GFR 30-59 ml/min (06/08/2012); Skin cancer ("several"); Memory loss; Anginal pain ([place]); Dysrhythmia; Myocardial infarction ([place]) (1994); AICD (automatic cardioverter/defibrillator) present (03/30/2011); DOE (dyspnea on exertion); Achalasia; Renal failure; and Esophageal dysmotility.   Presented with  Postnasal drainage and weakness patient had been having cough productive of green yellow sputum for 3 days. Denies any chest pain no nausea no vomiting no diarrhea. Patient presented today to emergency department was found to have evidence of left lower lobe pneumonia. Worrisome for recurrent aspiration and given prior history. denies any fever but had some chills. Exposure to sick child.   Note in October patient was admitted for right lower lobe pneumonia secondary to aspiration requiring intubation and prolonged stay. Patient has possible coloration and followed by gastroenterology.  Last Botox treatment was in November.  the past had excellent response to botox. Hospitalist was called for admission for aspiration Pneumonia  Review of Systems:    Pertinent positives include:  productive cough  Constitutional:  No weight loss, night sweats, Fevers, chills, fatigue, weight loss  HEENT:  No headaches, Difficulty swallowing,Tooth/dental problems,Sore throat,  No sneezing, itching, ear ache,  nasal congestion, post nasal drip,  Cardio-vascular:  No chest pain, Orthopnea, PND, anasarca, dizziness, palpitations.no Bilateral lower extremity swelling  GI:  No heartburn, indigestion, abdominal pain, nausea, vomiting, diarrhea, change in bowel habits, loss of appetite, melena, blood in stool, hematemesis Resp:  no shortness of breath at rest. No dyspnea on exertion, No excess mucus, no, No non-productive cough, No coughing up of blood.No change in color of mucus.No wheezing. Skin:  no rash or lesions. No jaundice GU:  no dysuria, change in color of urine, no urgency or frequency. No straining to urinate.  No flank pain.  Musculoskeletal:  No joint pain or no joint swelling. No decreased range of motion. No back pain.  Psych:  No change in mood or affect. No depression or anxiety. No memory loss.  Neuro: no localizing neurological complaints, no tingling, no weakness, no double vision, no gait abnormality, no slurred speech, no confusion  Otherwise ROS are negative except for above, 10 systems were reviewed  Past Medical History: Past Medical History  Diagnosis Date  . GERD (gastroesophageal reflux disease)   . Coronary artery disease     History of remote anterior MI with PCI to LAD in 2006; most recent cath 2007, no intervention required  . Chronic systolic dysfunction of left ventricle     EF 30-35%, CLASS II - III SYMPTOMS; intolerant to Coreg  . Hyperlipidemia   . PVC's (premature ventricular contractions)   . BPH (benign prostatic hyperplasia)   . CHF (congestive heart failure) ([place])   . COPD (chronic obstructive pulmonary disease) ([place])   . Heart murmur     hx  . Pneumonia "several times"    aspiration pna with at least 3 admits for this 2016.   . Type II diabetes mellitus ([place])   . CKD (chronic kidney disease) stage 3, GFR 30-59 ml/min  06/08/2012  . Skin cancer "several"    "forearms; head"  . Memory loss   . Anginal pain ([place])   . Dysrhythmia   . Myocardial  infarction ([place]) 1994    ANTERIOR  . AICD (automatic cardioverter/defibrillator) present 03/30/2011    Analyze ST study patient  . DOE (dyspnea on exertion)     with heavy exertion  . Achalasia   . Renal failure   . Esophageal dysmotility    Past Surgical History  Procedure Laterality Date  . Foot fracture surgery Right 1980's  . Cholecystectomy  2/11  . Cataract extraction w/ intraocular lens  implant, bilateral  08/2014-09/2014  . Cardiac catheterization  01/11/2006    DEMONSTRATES AKINESIA OF THE DISTAL ANTERIOR WALL, DISTAL INFERIOR WALL AND AKINESIA OF THE APEX. THE BASAL SEGMENTS CONTRACT WELL AND OVERALL EF 35%  . Coronary angioplasty  1994    TO THE LAD  . Skin cancer excision  "several"    "forearms, head"  . Esophageal manometry N/A 03/16/2015    Procedure: ESOPHAGEAL MANOMETRY (EM);  Surgeon: Jerene Bears, MD;  Location: [facility];  Service: Gastroenterology;  Laterality: N/A;  . Insert / replace / remove pacemaker    . Esophagogastroduodenoscopy N/A 04/08/2015    Procedure: ESOPHAGOGASTRODUODENOSCOPY (EGD);  Surgeon: Milus Banister, MD;  Location: [place];  Service: Endoscopy;  Laterality: N/A;  . Botox injection N/A 04/08/2015    Procedure: BOTOX INJECTION;  Surgeon: Milus Banister, MD;  Location: [place];  Service: Endoscopy;  Laterality: N/A;     Medications: Prior to Admission medications   Medication Sig Start Date End Date Taking? Authorizing Provider  aspirin 81 MG EC tablet Take 81 mg by mouth daily.      Historical Provider, MD  budesonide-formoterol (SYMBICORT) 160-4.5 MCG/ACT inhaler Inhale 2 puffs into the lungs 2 (two) times daily. 09/19/14   Juanito Doom, MD  Cholecalciferol (VITAMIN D PO) Take 2,000 Units by mouth daily.     Historical Provider, MD  donepezil (ARICEPT) 10 MG tablet Take 10 mg by mouth every morning.    Historical Provider, MD  fenofibrate (TRICOR) 145 MG tablet TAKE 1 TABLET BY MOUTH EVERY DAY 01/22/15   Peter M Martinique, MD    furosemide (LASIX) 20 MG tablet Take 2 tablets (40 mg total) by mouth daily. 05/15/15   Asiyah Cletis Media, MD  insulin glargine (LANTUS) 100 UNIT/ML injection Inject 0.1 mLs (10 Units total) into the skin every morning. 05/15/15   Asiyah Cletis Media, MD  losartan (COZAAR) 25 MG tablet Take 25 mg by mouth daily.    Historical Provider, MD  memantine (NAMENDA) 10 MG tablet Take 10 mg by mouth 2 (two) times daily.    Historical Provider, MD  Multiple Vitamins-Minerals (PRESERVISION AREDS 2) CAPS Take 1 capsule by mouth 2 (two) times daily.    Historical Provider, MD  nitroGLYCERIN (NITRODUR - DOSED IN MG/24 HR) 0.2 mg/hr patch Place 1 patch (0.2 mg total) onto the skin daily. 05/08/15   Thompson Grayer, MD  pantoprazole (PROTONIX) 20 MG tablet TAKE 1 TABLET BY MOUTH EVERY DAY 07/18/14   Inda Castle, MD  pravastatin (PRAVACHOL) 40 MG tablet TAKE 1 TABLET(40 MG) BY MOUTH EVERY EVENING 06/17/15   Peter M Martinique, MD  PROVENTIL HFA 108 (90 BASE) MCG/ACT inhaler Inhale 2 puffs into the lungs every 6 (six) hours as needed for wheezing or shortness of breath. As directed 05/24/13   Historical Provider, MD  SPIRIVA HANDIHALER 18 MCG inhalation  capsule INHALE CONTENTS OF 1 CAPSULE VIA HANDIHALER ONCE DAILY 06/26/15   Juanito Doom, MD    Allergies:   Allergies  Allergen Reactions  . Codeine Other (See Comments)    Gets very angry, disoriented  . Dilaudid [Hydromorphone Hcl] Other (See Comments)    VERY AGITATED, HOSTILE  . Flomax [Tamsulosin Hcl] Shortness Of Breath  . Morphine And Related Other (See Comments)    VERY AGITATED, HOSTILE  . Sulfa Antibiotics Shortness Of Breath  . Beta Adrenergic Blockers Other (See Comments)    ? disorientation  . Carvedilol Other (See Comments)    DISORIENTATION    Social History:  Ambulatory independently   Lives at home alone,         reports that he quit smoking about 22 years ago. His smoking use included Cigarettes. He has a 35 pack-year smoking  history. He has never used smokeless tobacco. He reports that he drinks alcohol. He reports that he does not use illicit drugs.    Family History: family history includes Stroke in his father. There is no history of Colon cancer.    Physical Exam: Patient Vitals for the past 24 hrs:  BP Temp Temp src Pulse Resp SpO2 Weight  06/29/15 2100 - - - - - - 87 kg (191 lb 12.8 oz)  06/29/15 1930 121/57 mmHg - - 61 21 99 % -  06/29/15 1915 126/63 mmHg - - 66 25 98 % -  06/29/15 1856 - 99.7 F (37.6 C) Rectal - - - -  06/29/15 1845 114/68 mmHg - - 66 19 100 % -  06/29/15 1830 103/58 mmHg - - 68 23 95 % -  06/29/15 1800 102/63 mmHg - - 74 (!) 30 95 % -  06/29/15 1757 (!) 111/52 mmHg 99.3 F (37.4 C) Oral 80 18 97 % -    1. General:  in No Acute distress 2. Psychological: Alert and  Oriented 3. Head/ENT:    Dry Mucous Membranes                          Head Non traumatic, neck supple                          Normal   Dentition 4. SKIN:   decreased Skin turgor,  Skin clean Dry and intact no rash 5. Heart: Regular rate and rhythm no Murmur, Rub or gallop 6. Lungs:  no wheezes some crackles   7. Abdomen: Soft, non-tender, Non distended 8. Lower extremities: no clubbing, cyanosis, or edema 9. Neurologically Grossly intact, moving all 4 extremities equally 10. MSK: Normal range of motion  body mass index is 27.52 kg/(m^2).   Labs on Admission:   Results for orders placed or performed during the hospital encounter of 06/29/15 (from the past 24 hour(s))  CBC with Differential     Status: Abnormal   Collection Time: 06/29/15  6:45 PM  Result Value Ref Range   WBC 10.9 (H) 4.0 - 10.5 K/uL   RBC 4.35 4.22 - 5.81 MIL/uL   Hemoglobin 11.2 (L) 13.0 - 17.0 g/dL   HCT 37.1 (L) 39.0 - 52.0 %   MCV 85.3 78.0 - 100.0 fL   MCH 25.7 (L) 26.0 - 34.0 pg   MCHC 30.2 30.0 - 36.0 g/dL   RDW 16.1 (H) 11.5 - 15.5 %   Platelets 250 150 - 400 K/uL   Neutrophils Relative % 70 %  Neutro Abs 7.7 1.7 - 7.7  K/uL   Lymphocytes Relative 21 %   Lymphs Abs 2.3 0.7 - 4.0 K/uL   Monocytes Relative 7 %   Monocytes Absolute 0.8 0.1 - 1.0 K/uL   Eosinophils Relative 2 %   Eosinophils Absolute 0.2 0.0 - 0.7 K/uL   Basophils Relative 0 %   Basophils Absolute 0.0 0.0 - 0.1 K/uL  Comprehensive metabolic panel     Status: Abnormal   Collection Time: 06/29/15  6:45 PM  Result Value Ref Range   Sodium 139 135 - 145 mmol/L   Potassium 4.1 3.5 - 5.1 mmol/L   Chloride 104 101 - 111 mmol/L   CO2 24 22 - 32 mmol/L   Glucose, Bld 208 (H) 65 - 99 mg/dL   BUN 50 (H) 6 - 20 mg/dL   Creatinine, Ser 2.08 (H) 0.61 - 1.24 mg/dL   Calcium 9.1 8.9 - 10.3 mg/dL   Total Protein 6.3 (L) 6.5 - 8.1 g/dL   Albumin 3.0 (L) 3.5 - 5.0 g/dL   AST 21 15 - 41 U/L   ALT 17 17 - 63 U/L   Alkaline Phosphatase 38 38 - 126 U/L   Total Bilirubin 0.6 0.3 - 1.2 mg/dL   GFR calc non Af Amer 29 (L) >60 mL/min   GFR calc Af Amer 33 (L) >60 mL/min   Anion gap 11 5 - 15  Brain natriuretic peptide     Status: Abnormal   Collection Time: 06/29/15  6:45 PM  Result Value Ref Range   B Natriuretic Peptide 104.3 (H) 0.0 - 100.0 pg/mL  I-Stat Troponin, ED (not at [facility])     Status: None   Collection Time: 06/29/15  6:50 PM  Result Value Ref Range   Troponin i, poc 0.01 0.00 - 0.08 ng/mL   Comment 3          Urinalysis, Routine w reflex microscopic (not at [facility])     Status: Abnormal   Collection Time: 06/29/15  8:51 PM  Result Value Ref Range   Color, Urine YELLOW YELLOW   APPearance CLEAR CLEAR   Specific Gravity, Urine 1.019 1.005 - 1.030   pH 5.0 5.0 - 8.0   Glucose, UA 100 (A) NEGATIVE mg/dL   Hgb urine dipstick NEGATIVE NEGATIVE   Bilirubin Urine NEGATIVE NEGATIVE   Ketones, ur NEGATIVE NEGATIVE mg/dL   Protein, ur NEGATIVE NEGATIVE mg/dL   Nitrite NEGATIVE NEGATIVE   Leukocytes, UA NEGATIVE NEGATIVE    UA no  evidence of UTI  Lab Results  Component Value Date   HGBA1C 8.5* 03/22/2015    Estimated Creatinine Clearance:  29.7 mL/min (by C-G formula based on Cr of 2.08).  BNP (last 3 results) No results for input(s): PROBNP in the last 8760 hours.  Other results:  I have pearsonaly reviewed this: ECG REPORT  Rate:74   Rhythm: left fascicular block ST&T Change: no ischemic changes QTC wnl   Filed Weights   06/29/15 2100  Weight: 87 kg (191 lb 12.8 oz)     Cultures:    Component Value Date/Time   SDES URINE, CLEAN CATCH 05/12/2015 0920   SPECREQUEST NONE 05/12/2015 0920   CULT MULTIPLE SPECIES PRESENT, SUGGEST RECOLLECTION 05/12/2015 0920   REPTSTATUS 05/13/2015 FINAL 05/12/2015 0920     Radiological Exams on Admission: Dg Chest 2 View  06/29/2015  CLINICAL DATA:  White-ish yellow productive cough, malaise X 5-6 days, feels feverish hx of diabetes, HTN, heart attack in 2011 EXAM: CHEST  2 VIEW COMPARISON:  05/12/2015 FINDINGS: Hyperinflation. Lateral view degraded by patient arm position. Patient rotated minimally right on the frontal. Mild cardiomegaly. Pacer/ AICD device. Atherosclerosis in the transverse aorta. No pleural effusion or pneumothorax. Subtle retrocardiac opacity on the lateral view, favored to correspond to medial left lower lobe airspace disease on the frontal. Interval clearing of the right lower lobe airspace disease. IMPRESSION: Interval clearing of right lower lobe airspace disease/ pneumonia. subtle medial left lower lobe opacity, suspicious for new infection. Mild cardiomegaly with atherosclerosis and mild hyperinflation. Electronically Signed   By: Abigail Miyamoto M.D.   On: 06/29/2015 20:16    Chart has been reviewed  Family   at  Bedside  plan of care was discussed with Daughter Adrienne Winders ([phone]  Assessment/Plan  81 yo M with hx of dysphasia secondary to history of achalasia here for recurrent pneumonia likely secondary to aspiration  Present on Admission:  . Aspiration pneumonia ([place]) vs . HCAP (healthcare-associated pneumonia) - will admit for treatment of  HCAP versus aspiration pneumonia will start on appropriate antibiotic coverage. Cover with Zosyn . COPD exacerbation ([place]) - continue home medications currently stable  . Cardiomyopathy, ischemic - currently does not appear to be fluid overload continue Lasix but slightly decreased dose given worsening renal function     Obtain sputum cultures, blood cultures if febrile or if decompensates.  Provide oxygen as needed.   . Achalasia - chronic has been treated with Botox injections with some improvement, patient probably at risk of aspiration given recurrent pneumonia, will obtain speech pathology evaluation for check for silent aspiration . Renal failure (ARF), acute on chronic ([name])    mild will decreased dose of lasix and monitor closely creatinine   Prophylaxis:   Lovenox   CODE STATUS:  FULL CODE as per patient    Disposition:  To home once workup is complete and patient is stable  Other plan as per orders.  I have spent a total of 55 min on this admission  Graycen Degan 06/29/2015, 10:39 PM  [facility]  Pager [phone]   after 2 AM please page floor coverage PA If 7AM-7PM, please contact the day team taking care of the patient  [email or web address]  Password [email or web address]

## 2015-06-29 NOTE — ED Provider Notes (Signed)
CSN: [id number]     Arrival date & time 06/29/15  1753 History   First MD Initiated Contact with Patient 06/29/15 1759     Chief Complaint  Patient presents with  . Fatigue  . Cough     (Consider location/radiation/quality/duration/timing/severity/associated sxs/prior Treatment) HPI Comments: 81 y.o. Male with history of PNA, recent intubation, DM, CKD, COPD presents for shortness of breath, chills, cough, sputum production.  The patient and his daughter report that over the last few days the patient has developed symptoms similar to when he has been diagnosed with pneumonia and he has a history of aspiration pneumonia.  They report that last time he was sick like this he decompensated quickly and ended up intubated and in the ICU.  Denies chest pain or fevers.  Reports chills but no significant rigors.    Past Medical History  Diagnosis Date  . GERD (gastroesophageal reflux disease)   . Coronary artery disease     History of remote anterior MI with PCI to LAD in 2006; most recent cath 2007, no intervention required  . Chronic systolic dysfunction of left ventricle     EF 30-35%, CLASS II - III SYMPTOMS; intolerant to Coreg  . Hyperlipidemia   . PVC's (premature ventricular contractions)   . BPH (benign prostatic hyperplasia)   . CHF (congestive heart failure) ([place])   . COPD (chronic obstructive pulmonary disease) ([place])   . Heart murmur     hx  . Pneumonia "several times"    aspiration pna with at least 3 admits for this 2016.   . Type II diabetes mellitus ([place])   . CKD (chronic kidney disease) stage 3, GFR 30-59 ml/min 06/08/2012  . Skin cancer "several"    "forearms; head"  . Memory loss   . Anginal pain ([place])   . Dysrhythmia   . Myocardial infarction ([place]) 1994    ANTERIOR  . AICD (automatic cardioverter/defibrillator) present 03/30/2011    Analyze ST study patient  . DOE (dyspnea on exertion)     with heavy exertion  . Achalasia   . Renal failure   . Esophageal  dysmotility    Past Surgical History  Procedure Laterality Date  . Foot fracture surgery Right 1980's  . Cholecystectomy  2/11  . Cataract extraction w/ intraocular lens  implant, bilateral  08/2014-09/2014  . Cardiac catheterization  01/11/2006    DEMONSTRATES AKINESIA OF THE DISTAL ANTERIOR WALL, DISTAL INFERIOR WALL AND AKINESIA OF THE APEX. THE BASAL SEGMENTS CONTRACT WELL AND OVERALL EF 35%  . Coronary angioplasty  1994    TO THE LAD  . Skin cancer excision  "several"    "forearms, head"  . Esophageal manometry N/A 03/16/2015    Procedure: ESOPHAGEAL MANOMETRY (EM);  Surgeon: Jerene Bears, MD;  Location: [facility];  Service: Gastroenterology;  Laterality: N/A;  . Insert / replace / remove pacemaker    . Esophagogastroduodenoscopy N/A 04/08/2015    Procedure: ESOPHAGOGASTRODUODENOSCOPY (EGD);  Surgeon: Milus Banister, MD;  Location: [place];  Service: Endoscopy;  Laterality: N/A;  . Botox injection N/A 04/08/2015    Procedure: BOTOX INJECTION;  Surgeon: Milus Banister, MD;  Location: [place];  Service: Endoscopy;  Laterality: N/A;   Family History  Problem Relation Age of Onset  . Stroke Father     Mother history unknown - never met her  . Colon cancer Neg Hx    Social History  Substance Use Topics  . Smoking status: Former Smoker -- 1.00 packs/day for  35 years    Types: Cigarettes    Quit date: 08/01/1992  . Smokeless tobacco: Never Used  . Alcohol Use: 0.0 oz/week    0 Standard drinks or equivalent per week     Comment: Approximately a beer every 2 years    Review of Systems  Constitutional: Positive for chills and fatigue. Negative for fever.  HENT: Negative for congestion, postnasal drip and rhinorrhea.   Eyes: Negative for visual disturbance.  Respiratory: Positive for cough and shortness of breath. Negative for chest tightness and wheezing.   Cardiovascular: Negative for chest pain and palpitations.  Gastrointestinal: Negative for nausea, vomiting,  abdominal pain and diarrhea.  Genitourinary: Negative for dysuria and hematuria.  Musculoskeletal: Positive for myalgias. Negative for back pain.  Skin: Negative for rash.  Neurological: Negative for dizziness, weakness, light-headedness and headaches.  Hematological: Bruises/bleeds easily (on ASA).      Allergies  Codeine; Dilaudid; Flomax; Morphine and related; Sulfa antibiotics; Beta adrenergic blockers; and Carvedilol  Home Medications   Prior to Admission medications   Medication Sig Start Date End Date Taking? Authorizing Provider  aspirin 81 MG EC tablet Take 81 mg by mouth daily.      Historical Provider, MD  budesonide-formoterol (SYMBICORT) 160-4.5 MCG/ACT inhaler Inhale 2 puffs into the lungs 2 (two) times daily. 09/19/14   Juanito Doom, MD  Cholecalciferol (VITAMIN D PO) Take 2,000 Units by mouth daily.     Historical Provider, MD  donepezil (ARICEPT) 10 MG tablet Take 10 mg by mouth every morning.    Historical Provider, MD  fenofibrate (TRICOR) 145 MG tablet TAKE 1 TABLET BY MOUTH EVERY DAY 01/22/15   Peter M Martinique, MD  furosemide (LASIX) 20 MG tablet Take 2 tablets (40 mg total) by mouth daily. 05/15/15   Asiyah Cletis Media, MD  insulin glargine (LANTUS) 100 UNIT/ML injection Inject 0.1 mLs (10 Units total) into the skin every morning. 05/15/15   Asiyah Cletis Media, MD  losartan (COZAAR) 25 MG tablet Take 25 mg by mouth daily.    Historical Provider, MD  memantine (NAMENDA) 10 MG tablet Take 10 mg by mouth 2 (two) times daily.    Historical Provider, MD  Multiple Vitamins-Minerals (PRESERVISION AREDS 2) CAPS Take 1 capsule by mouth 2 (two) times daily.    Historical Provider, MD  nitroGLYCERIN (NITRODUR - DOSED IN MG/24 HR) 0.2 mg/hr patch Place 1 patch (0.2 mg total) onto the skin daily. 05/08/15   Thompson Grayer, MD  pantoprazole (PROTONIX) 20 MG tablet TAKE 1 TABLET BY MOUTH EVERY DAY 07/18/14   Inda Castle, MD  pravastatin (PRAVACHOL) 40 MG tablet TAKE 1  TABLET(40 MG) BY MOUTH EVERY EVENING 06/17/15   Peter M Martinique, MD  PROVENTIL HFA 108 (90 BASE) MCG/ACT inhaler Inhale 2 puffs into the lungs every 6 (six) hours as needed for wheezing or shortness of breath. As directed 05/24/13   Historical Provider, MD  SPIRIVA HANDIHALER 18 MCG inhalation capsule INHALE CONTENTS OF 1 CAPSULE VIA HANDIHALER ONCE DAILY 06/26/15   Juanito Doom, MD   BP 119/46 mmHg  Pulse 84  Temp(Src) 99.9 F (37.7 C) (Oral)  Resp 18  Ht 5' 10"  (1.778 m)  Wt 191 lb 12.8 oz (87 kg)  BMI 27.52 kg/m2  SpO2 97% Physical Exam  Constitutional: He is oriented to person, place, and time. He appears well-developed and well-nourished. No distress.  HENT:  Head: Normocephalic and atraumatic.  Right Ear: External ear normal.  Left Ear: External ear normal.  Mouth/Throat: Oropharynx is clear and moist. No oropharyngeal exudate.  Eyes: EOM are normal. Pupils are equal, round, and reactive to light.  Neck: Normal range of motion. Neck supple.  Cardiovascular: Normal rate, regular rhythm and intact distal pulses.   Pulmonary/Chest: Effort normal. No respiratory distress. He has decreased breath sounds. He has wheezes (few, mild). He has no rales.  Abdominal: Soft. He exhibits no distension. There is no tenderness.  Musculoskeletal: He exhibits no edema.  Neurological: He is alert and oriented to person, place, and time.  Skin: Skin is warm and dry. No rash noted. He is not diaphoretic.  Vitals reviewed.   ED Course  Procedures (including critical care time) Labs Review Labs Reviewed  CBC WITH DIFFERENTIAL/PLATELET - Abnormal; Notable for the following:    WBC 10.9 (*)    Hemoglobin 11.2 (*)    HCT 37.1 (*)    MCH 25.7 (*)    RDW 16.1 (*)    All other components within normal limits  COMPREHENSIVE METABOLIC PANEL - Abnormal; Notable for the following:    Glucose, Bld 208 (*)    BUN 50 (*)    Creatinine, Ser 2.08 (*)    Total Protein 6.3 (*)    Albumin 3.0 (*)     GFR calc non Af Amer 29 (*)    GFR calc Af Amer 33 (*)    All other components within normal limits  BRAIN NATRIURETIC PEPTIDE - Abnormal; Notable for the following:    B Natriuretic Peptide 104.3 (*)    All other components within normal limits  URINALYSIS, ROUTINE W REFLEX MICROSCOPIC (NOT AT [facility]) - Abnormal; Notable for the following:    Glucose, UA 100 (*)    All other components within normal limits  INFLUENZA PANEL BY PCR (TYPE A & B, H1N1)  I-STAT TROPOININ, ED    Imaging Review Dg Chest 2 View  06/29/2015  CLINICAL DATA:  White-ish yellow productive cough, malaise X 5-6 days, feels feverish hx of diabetes, HTN, heart attack in 2011 EXAM: CHEST  2 VIEW COMPARISON:  05/12/2015 FINDINGS: Hyperinflation. Lateral view degraded by patient arm position. Patient rotated minimally right on the frontal. Mild cardiomegaly. Pacer/ AICD device. Atherosclerosis in the transverse aorta. No pleural effusion or pneumothorax. Subtle retrocardiac opacity on the lateral view, favored to correspond to medial left lower lobe airspace disease on the frontal. Interval clearing of the right lower lobe airspace disease. IMPRESSION: Interval clearing of right lower lobe airspace disease/ pneumonia. subtle medial left lower lobe opacity, suspicious for new infection. Mild cardiomegaly with atherosclerosis and mild hyperinflation. Electronically Signed   By: Abigail Miyamoto M.D.   On: 06/29/2015 20:16   I have personally reviewed and evaluated these images and lab results as part of my medical decision-making.   EKG Interpretation   Date/Time:  Monday June 29 2015 18:02:17 EST Ventricular Rate:  74 PR Interval:  156 QRS Duration: 115 QT Interval:  371 QTC Calculation: 412 R Axis:   -112 Text Interpretation:  Sinus rhythm Left anterior fascicular block  Anteroseptal infarct, age indeterminate No significant change since last  tracing Confirmed by Lonia Skinner ([id number]) on 06/29/2015 6:45:54 PM       MDM  Patient seen and evaluated in stable condition.  Complicated history reviewed in chart briefly.  Symptoms consistent with infection and chest xray with new opacity.  Patient started on Vanc/Zosyn for HCAP/aspiration pneumonia.  Breathing treatment given with mild improvement.  Case discussed with the hospitalist who agreed with  admission and the patient was admitted under her care for further treatment.  Patient and family updated on results and plan of care. Final diagnoses:  HCAP (healthcare-associated pneumonia)    1. HCAP vs aspiration pneumonia    Harvel Quale, MD 06/30/15 [phone]

## 2015-06-30 ENCOUNTER — Encounter (HOSPITAL_COMMUNITY): Payer: Self-pay | Admitting: *Deleted

## 2015-06-30 DIAGNOSIS — E119 Type 2 diabetes mellitus without complications: Secondary | ICD-10-CM

## 2015-06-30 LAB — CBC WITH DIFFERENTIAL/PLATELET
BASOS PCT: 0 %
Basophils Absolute: 0 10*3/uL (ref 0.0–0.1)
EOS ABS: 0.2 10*3/uL (ref 0.0–0.7)
Eosinophils Relative: 2 %
HEMATOCRIT: 36.3 % — AB (ref 39.0–52.0)
Hemoglobin: 11.3 g/dL — ABNORMAL LOW (ref 13.0–17.0)
Lymphocytes Relative: 19 %
Lymphs Abs: 2.2 10*3/uL (ref 0.7–4.0)
MCH: 26.2 pg (ref 26.0–34.0)
MCHC: 31.1 g/dL (ref 30.0–36.0)
MCV: 84.2 fL (ref 78.0–100.0)
MONO ABS: 0.7 10*3/uL (ref 0.1–1.0)
MONOS PCT: 6 %
NEUTROS ABS: 8.5 10*3/uL — AB (ref 1.7–7.7)
Neutrophils Relative %: 73 %
Platelets: 231 10*3/uL (ref 150–400)
RBC: 4.31 MIL/uL (ref 4.22–5.81)
RDW: 16 % — AB (ref 11.5–15.5)
WBC: 11.7 10*3/uL — ABNORMAL HIGH (ref 4.0–10.5)

## 2015-06-30 LAB — COMPREHENSIVE METABOLIC PANEL
ALBUMIN: 2.7 g/dL — AB (ref 3.5–5.0)
ALK PHOS: 36 U/L — AB (ref 38–126)
ALT: 17 U/L (ref 17–63)
AST: 19 U/L (ref 15–41)
Anion gap: 8 (ref 5–15)
BUN: 40 mg/dL — ABNORMAL HIGH (ref 6–20)
CALCIUM: 9.1 mg/dL (ref 8.9–10.3)
CO2: 26 mmol/L (ref 22–32)
CREATININE: 1.84 mg/dL — AB (ref 0.61–1.24)
Chloride: 108 mmol/L (ref 101–111)
GFR calc Af Amer: 38 mL/min — ABNORMAL LOW (ref >60)
GFR calc non Af Amer: 33 mL/min — ABNORMAL LOW (ref >60)
GLUCOSE: 192 mg/dL — AB (ref 65–99)
Potassium: 4 mmol/L (ref 3.5–5.1)
SODIUM: 142 mmol/L (ref 135–145)
Total Bilirubin: 0.5 mg/dL (ref 0.3–1.2)
Total Protein: 6.2 g/dL — ABNORMAL LOW (ref 6.5–8.1)

## 2015-06-30 LAB — GLUCOSE, CAPILLARY
GLUCOSE-CAPILLARY: 150 mg/dL — AB (ref 65–99)
Glucose-Capillary: 205 mg/dL — ABNORMAL HIGH (ref 65–99)
Glucose-Capillary: 162 mg/dL — ABNORMAL HIGH (ref 65–99)
Glucose-Capillary: 210 mg/dL — ABNORMAL HIGH (ref 65–99)
Glucose-Capillary: 428 mg/dL — ABNORMAL HIGH (ref 65–99)

## 2015-06-30 LAB — INFLUENZA PANEL BY PCR (TYPE A & B)
H1N1FLUPCR: NOT DETECTED
INFLAPCR: NEGATIVE
INFLBPCR: NEGATIVE

## 2015-06-30 LAB — GLUCOSE, RANDOM: GLUCOSE: 403 mg/dL — AB (ref 65–99)

## 2015-06-30 LAB — STREP PNEUMONIAE URINARY ANTIGEN: Strep Pneumo Urinary Antigen: NEGATIVE

## 2015-06-30 MED ORDER — SODIUM CHLORIDE 0.9 % IV SOLN: 250.0000 mL | INTRAVENOUS | Status: DC | PRN

## 2015-06-30 MED ORDER — ENOXAPARIN SODIUM 30 MG/0.3ML ~~LOC~~ SOLN
30.0000 mg | Freq: Every day | SUBCUTANEOUS | Status: DC
  Administered 2015-06-30: 30 mg via SUBCUTANEOUS
  Filled 2015-06-30: qty 0.3

## 2015-06-30 MED ORDER — PRAVASTATIN SODIUM 40 MG PO TABS
40.0000 mg | ORAL_TABLET | Freq: Every day | ORAL | Status: DC
  Administered 2015-06-30 – 2015-07-01 (×2): 40 mg via ORAL
  Filled 2015-06-30 (×2): qty 1

## 2015-06-30 MED ORDER — INSULIN GLARGINE 100 UNIT/ML ~~LOC~~ SOLN
10.0000 [IU] | Freq: Every morning | SUBCUTANEOUS | Status: DC
  Administered 2015-06-30: 10 [IU] via SUBCUTANEOUS
  Filled 2015-06-30: qty 0.1

## 2015-06-30 MED ORDER — SODIUM CHLORIDE 0.9 % IJ SOLN
3.0000 mL | Freq: Two times a day (BID) | INTRAMUSCULAR | Status: DC
  Administered 2015-06-30 – 2015-07-02 (×4): 3 mL via INTRAVENOUS

## 2015-06-30 MED ORDER — INSULIN ASPART 100 UNIT/ML ~~LOC~~ SOLN
10.0000 [IU] | Freq: Once | SUBCUTANEOUS | Status: AC
  Administered 2015-06-30: 10 [IU] via SUBCUTANEOUS

## 2015-06-30 MED ORDER — SODIUM CHLORIDE 0.9 % IJ SOLN: 3.0000 mL | INTRAMUSCULAR | Status: DC | PRN

## 2015-06-30 MED ORDER — MEMANTINE HCL 10 MG PO TABS
10.0000 mg | ORAL_TABLET | Freq: Two times a day (BID) | ORAL | Status: DC
  Administered 2015-06-30 – 2015-07-02 (×5): 10 mg via ORAL
  Filled 2015-06-30 (×5): qty 1

## 2015-06-30 MED ORDER — FUROSEMIDE 20 MG PO TABS
20.0000 mg | ORAL_TABLET | Freq: Every day | ORAL | Status: DC
  Administered 2015-06-30: 20 mg via ORAL
  Filled 2015-06-30: qty 1

## 2015-06-30 MED ORDER — INSULIN ASPART 100 UNIT/ML ~~LOC~~ SOLN
0.0000 [IU] | Freq: Every day | SUBCUTANEOUS | Status: DC
  Administered 2015-07-01: 5 [IU] via SUBCUTANEOUS

## 2015-06-30 MED ORDER — FENOFIBRATE 54 MG PO TABS
54.0000 mg | ORAL_TABLET | Freq: Every day | ORAL | Status: DC
  Administered 2015-06-30 – 2015-07-01 (×2): 54 mg via ORAL
  Filled 2015-06-30 (×3): qty 1

## 2015-06-30 MED ORDER — SODIUM CHLORIDE 0.9 % IV SOLN
INTRAVENOUS | Status: AC
  Administered 2015-06-30: 12:00:00 via INTRAVENOUS

## 2015-06-30 MED ORDER — INSULIN ASPART 100 UNIT/ML ~~LOC~~ SOLN
0.0000 [IU] | Freq: Three times a day (TID) | SUBCUTANEOUS | Status: DC
  Administered 2015-06-30: 1 [IU] via SUBCUTANEOUS
  Administered 2015-06-30: 3 [IU] via SUBCUTANEOUS
  Administered 2015-06-30: 2 [IU] via SUBCUTANEOUS
  Administered 2015-07-01: 3 [IU] via SUBCUTANEOUS
  Administered 2015-07-01: 5 [IU] via SUBCUTANEOUS
  Administered 2015-07-01: 3 [IU] via SUBCUTANEOUS
  Administered 2015-07-02 (×2): 7 [IU] via SUBCUTANEOUS

## 2015-06-30 MED ORDER — METHYLPREDNISOLONE SODIUM SUCC 125 MG IJ SOLR
60.0000 mg | Freq: Two times a day (BID) | INTRAMUSCULAR | Status: DC
  Administered 2015-06-30 (×2): 60 mg via INTRAVENOUS
  Filled 2015-06-30 (×2): qty 2

## 2015-06-30 MED ORDER — BUDESONIDE-FORMOTEROL FUMARATE 160-4.5 MCG/ACT IN AERO
2.0000 | INHALATION_SPRAY | Freq: Two times a day (BID) | RESPIRATORY_TRACT | Status: DC
  Administered 2015-06-30 – 2015-07-02 (×4): 2 via RESPIRATORY_TRACT
  Filled 2015-06-30 (×2): qty 6

## 2015-06-30 MED ORDER — PANTOPRAZOLE SODIUM 20 MG PO TBEC
20.0000 mg | DELAYED_RELEASE_TABLET | Freq: Every day | ORAL | Status: DC
  Administered 2015-06-30 – 2015-07-02 (×3): 20 mg via ORAL
  Filled 2015-06-30 (×3): qty 1

## 2015-06-30 MED ORDER — ASPIRIN EC 81 MG PO TBEC
81.0000 mg | DELAYED_RELEASE_TABLET | Freq: Every day | ORAL | Status: DC
  Administered 2015-06-30 – 2015-07-02 (×3): 81 mg via ORAL
  Filled 2015-06-30 (×3): qty 1

## 2015-06-30 MED ORDER — DONEPEZIL HCL 10 MG PO TABS
10.0000 mg | ORAL_TABLET | Freq: Every morning | ORAL | Status: DC
  Administered 2015-06-30 – 2015-07-02 (×3): 10 mg via ORAL
  Filled 2015-06-30 (×3): qty 1

## 2015-06-30 MED ORDER — INSULIN GLARGINE 100 UNIT/ML ~~LOC~~ SOLN
10.0000 [IU] | Freq: Two times a day (BID) | SUBCUTANEOUS | Status: DC
  Administered 2015-06-30 – 2015-07-02 (×4): 10 [IU] via SUBCUTANEOUS
  Filled 2015-06-30 (×5): qty 0.1

## 2015-06-30 NOTE — Progress Notes (Signed)
Utilization review complete. Cassandre Oleksy RN CCM Case Mgmt phone [phone] 

## 2015-06-30 NOTE — Progress Notes (Signed)
NURSING PROGRESS NOTE  Antonio Geddie CagleMRN: [id number] Admission Data: 06/29/15 11:50pm Attending Provider: Toy Baker, MD PCP: [name], MD Code status: Full  Allergies:  Allergies  Allergen Reactions  . Codeine Other (See Comments)    Gets very angry, disoriented  . Dilaudid [Hydromorphone Hcl] Other (See Comments)    VERY AGITATED, HOSTILE  . Flomax [Tamsulosin Hcl] Shortness Of Breath  . Morphine And Related Other (See Comments)    VERY AGITATED, HOSTILE  . Sulfa Antibiotics Shortness Of Breath  . Beta Adrenergic Blockers Other (See Comments)    ? disorientation  . Carvedilol Other (See Comments)    DISORIENTATION     Past Medical History:  Past Medical History  Diagnosis Date  . GERD (gastroesophageal reflux disease)   . Coronary artery disease     History of remote anterior MI with PCI to LAD in 2006; most recent cath 2007, no intervention required  . Chronic systolic dysfunction of left ventricle     EF 30-35%, CLASS II - III SYMPTOMS; intolerant to Coreg  . Hyperlipidemia   . PVC's (premature ventricular contractions)   . BPH (benign prostatic hyperplasia)   . CHF (congestive heart failure) ([place])   . COPD (chronic obstructive pulmonary disease) ([place])   . Heart murmur     hx  . Pneumonia "several times"    aspiration pna with at least 3 admits for this 2016.   . Type II diabetes mellitus ([place])   . CKD (chronic kidney disease) stage 3, GFR 30-59 ml/min 06/08/2012  . Skin cancer "several"    "forearms; head"  . Memory loss   . Anginal pain ([place])   . Dysrhythmia   . Myocardial infarction ([place]) 1994    ANTERIOR  . AICD (automatic cardioverter/defibrillator) present 03/30/2011    Analyze ST study patient  . DOE (dyspnea on exertion)     with heavy exertion  . Achalasia   . Renal failure   . Esophageal dysmotility      Past Surgical History:  Past Surgical History  Procedure Laterality Date  . Foot fracture surgery Right 1980's  .  Cholecystectomy  2/11  . Cataract extraction w/ intraocular lens  implant, bilateral  08/2014-09/2014  . Cardiac catheterization  01/11/2006    DEMONSTRATES AKINESIA OF THE DISTAL ANTERIOR WALL, DISTAL INFERIOR WALL AND AKINESIA OF THE APEX. THE BASAL SEGMENTS CONTRACT WELL AND OVERALL EF 35%  . Coronary angioplasty  1994    TO THE LAD  . Skin cancer excision  "several"    "forearms, head"  . Esophageal manometry N/A 03/16/2015    Procedure: ESOPHAGEAL MANOMETRY (EM);  Surgeon: Jerene Bears, MD;  Location: [facility];  Service: Gastroenterology;  Laterality: N/A;  . Insert / replace / remove pacemaker    . Esophagogastroduodenoscopy N/A 04/08/2015    Procedure: ESOPHAGOGASTRODUODENOSCOPY (EGD);  Surgeon: Milus Banister, MD;  Location: [place];  Service: Endoscopy;  Laterality: N/A;  . Botox injection N/A 04/08/2015    Procedure: BOTOX INJECTION;  Surgeon: Milus Banister, MD;  Location: [place];  Service: Endoscopy;  Laterality: N/A;    Antonio Ray is a 81 y.o. male patient, arrived to floor in room 5W01 via stretcher, transferred from ED. Patient alert and oriented X 4. No acute distress noted. Denies pain.   Vital signs: Oral temperature 99.67F (37.7 C), Blood pressure 119/46, Pulse 84, RR 18, SpO2 97 % on room  air. Weight 185 lbs .   IV access: right forearm; condition patent  and no redness.  Skin: clean, dry, and intact, no pressure ulcer noted in sacral area.   Patient's ID armband verified with patient and in place. Information packet given to patient. Fall risk assessed, SR up X2, patient able to verbalize understanding of risks associated with falls and to call nurse or staff to assist before getting out of bed. Patient oriented to room and equipment. Call bell within reach.

## 2015-06-30 NOTE — Progress Notes (Signed)
Speech Language Pathology    [name] MRN: [id number] DOB: Nov 02, 1933 Today's Date: 06/30/2015  Assessment / Plan / Recommendation Clinical Impression  Orders for bedside received.  SLP met with patient and family at bedside.  Upon SLP arrival patient and son stated that they have been through this numerous times and are aware of his esophageal issues and their impact on swallow function as well as risk of aspiration PNA.  Son accurately described esophageal findings and esophgeal precaution recommendations to SLP.  SLP emphasized that those strategeis are to minimize risk and not eliminate it; patient and son verbalized understanding.  SLP re-educated on diet choices and patient verbalized that he wants to continue on a soft solid diet with thin liquids so he can eat at [place].  Son reports that his father's presentation upon this admission is presenting differently from his last aspiration PNA event and wants to discuss other issues such as COPD that could be contributing to his father's breathing difficulty, RN made aware.  Given frequent hospitilizations for PNA with difficulty breathing a Palliative Care Consult may be beneficial at helping team and family to establish a clear plan of care for this patient.      HPI HPI: Patient is a 81 year old male with multiple prior admissions for aspiration PNA due to achalasia and esophageal dysphagia. Aspiration risk remains even following botox injections to with GE junction due to severe dysmotility that is worse with solids per MBS with SLP and Radiologist 05/13/15.  Patient elected to continue on a soft diet with thin liquids wtih use of esophgeal percautions.  He is currently admitted with fever, dyspnea aand CXR shows improvements in right lower lobe with left lower lobe appearing suspicious for infection.        SLP Plan  Education complete, SLP signing off    Recommendations  Diet recommendations: Dysphagia  3 (mechanical soft);Thin liquid;Other(comment) (per patient preference) Liquids provided via: Cup;Straw Medication Administration: Whole meds with liquid Compensations: Small sips/bites;Slow rate;Follow solids with liquid;Sit up and stay up 45-60 minutes after PO              Oral Care Recommendations: Oral care BID Follow up Recommendations: None  Carmelia Roller., [place] 06/30/2015, 11:32 AM

## 2015-06-30 NOTE — Progress Notes (Addendum)
[facility] PROGRESS NOTE    Progress Note   Antonio Ray [id number] DOB: March 01, 1934 DOA: 06/29/2015 PCP: [name], MD   Brief Narrative:   Antonio Ray is an 81 y.o. male past history of chronic systolic heart failure, COPD not oxygen dependent, diabetes mellitus type 2, chronic before meals station 3, with a defibrillator present since 2012, and esophageal dysmotility with achalasia status post dilation several years back, recently discharged from the hospital on October 2016 for right lower lobe pneumonia requiring intubation on a prolonged stay. They comes into the hospital for productive cough for the green-yellow sputum for the last 3 days chest x-ray in the ED showed new left lower lobe infiltrate.  Assessment/Plan:   SepsisAspiration pneumonia ([name])/HCAP (healthcare-associated pneumonia): I agree with treating her for healthcare associated pneumonia although I'm more concerned about an aspiration episode leading to this pneumonia. His saturations and vitals have been stable, she has remained afebrile and leukocytosis slightly improved. End-of-life discussion has not happened we'll consult PMT. Swallowing evaluation done 05/12/2015 showed esophageal dysphagia with high risk of aspiration when patient swallows any solids. Son he refused to meet with palliative care. He is adamant to accept this was an aspiration events. He and the son understand the risk and benefit of eating, they will like to proceed.  Renal failure (ARF), acute on chronic ([name]) stage III: Baseline creatinine is around 1.4, on admission was 2.0 is improving slowly with IV fluid hydration. Hold diuretics.  Diabetes mellitus type II, non insulin dependent ([place]): Continue current regimen blood glucose is improving.  Achalasia:  COPD exacerbation ([place]) - Patient is moving poor air he is currently not wheezing. Will start empirically on IV steroids.    Cardiomyopathy, ischemic/chronic  systolic heart failure: Seems to be euvolemic. Hold diuretics as new acute renal failure.  Type 2 diabetes mellitus with complication, with long-term current use of insulin ([place]): Currently on long-acting insulin plus sliding scale. I will increase his long-acting to twice a day and double the dose. As he is to be started on Solu-Medrol.    DVT Prophylaxis - Lovenox ordered.  Family Communication: son Disposition Plan: Home when stable. Code Status:     Code Status Orders        Start     Ordered   06/30/15 0301  Full code   Continuous     06/30/15 0303    Advance Directive Documentation        Most Recent Value   Type of Advance Directive  Healthcare Power of Shanon Payor Storlie(daugjhter)Allan Teuscher(son)]   Pre-existing out of facility DNR order (yellow form or pink MOST form)     "MOST" Form in Place?          IV Access:    Peripheral IV   Procedures and diagnostic studies:   Dg Chest 2 View  06/29/2015  CLINICAL DATA:  White-ish yellow productive cough, malaise X 5-6 days, feels feverish hx of diabetes, HTN, heart attack in 2011 EXAM: CHEST  2 VIEW COMPARISON:  05/12/2015 FINDINGS: Hyperinflation. Lateral view degraded by patient arm position. Patient rotated minimally right on the frontal. Mild cardiomegaly. Pacer/ AICD device. Atherosclerosis in the transverse aorta. No pleural effusion or pneumothorax. Subtle retrocardiac opacity on the lateral view, favored to correspond to medial left lower lobe airspace disease on the frontal. Interval clearing of the right lower lobe airspace disease. IMPRESSION: Interval clearing of right lower lobe airspace disease/ pneumonia. subtle medial left lower lobe opacity, suspicious for new  infection. Mild cardiomegaly with atherosclerosis and mild hyperinflation. Electronically Signed   By: Abigail Miyamoto M.D.   On: 06/29/2015 20:16     Medical Consultants:    None.  Anti-Infectives:   Anti-infectives    Start      Dose/Rate Route Frequency Ordered Stop   06/30/15 2200  vancomycin (VANCOCIN) IVPB 1000 mg/200 mL premix     1,000 mg 200 mL/hr over 60 Minutes Intravenous Every 24 hours 06/29/15 2126     06/29/15 2200  vancomycin (VANCOCIN) 1,250 mg in sodium chloride 0.9 % 250 mL IVPB     1,250 mg 166.7 mL/hr over 90 Minutes Intravenous  Once 06/29/15 2126 06/29/15 2258   06/29/15 2130  piperacillin-tazobactam (ZOSYN) IVPB 3.375 g     3.375 g 100 mL/hr over 30 Minutes Intravenous  Once 06/29/15 2119 06/29/15 2258   06/29/15 2115  levofloxacin (LEVAQUIN) IVPB 750 mg  Status:  Discontinued     750 mg 100 mL/hr over 90 Minutes Intravenous  Once 06/29/15 2111 06/29/15 2115      Subjective:    Antonio Ray patient has no new complaints, his son was really upset when I brought up the palliative care consult.  Objective:    Filed Vitals:   06/29/15 1930 06/29/15 2100 06/29/15 2347 06/30/15 0604  BP: 121/57  119/46 106/63  Pulse: 61  84 80  Temp:   99.9 F (37.7 C) 98.7 F (37.1 C)  TempSrc:   Oral Oral  Resp: 21  18 18   Height:  5\' 10"  (1.778 m)    Weight:  87 kg (191 lb 12.8 oz)    SpO2: 99%  97% 94%    Intake/Output Summary (Last 24 hours) at 06/30/15 1012 Last data filed at 06/30/15 0959  Gross per 24 hour  Intake    243 ml  Output      0 ml  Net    243 ml   Filed Weights   06/29/15 2100  Weight: 87 kg (191 lb 12.8 oz)    Exam: Gen:  NAD Cardiovascular:  RRR. Chest and lungs:   Moderate air movement no wheezing. Abdomen:  Abdomen soft, NT/ND, + BS Extremities:  No C/E/C   Data Reviewed:    Labs: Basic Metabolic Panel:  Recent Labs Lab 06/29/15 1845 06/30/15 0544  NA 139 142  K 4.1 4.0  CL 104 108  CO2 24 26  GLUCOSE 208* 192*  BUN 50* 40*  CREATININE 2.08* 1.84*  CALCIUM 9.1 9.1   GFR Estimated Creatinine Clearance: 33.6 mL/min (by C-G formula based on Cr of 1.84). Liver Function Tests:  Recent Labs Lab 06/29/15 1845 06/30/15 0544  AST 21 19  ALT  17 17  ALKPHOS 38 36*  BILITOT 0.6 0.5  PROT 6.3* 6.2*  ALBUMIN 3.0* 2.7*   No results for input(s): LIPASE, AMYLASE in the last 168 hours. No results for input(s): AMMONIA in the last 168 hours. Coagulation profile No results for input(s): INR, PROTIME in the last 168 hours.  CBC:  Recent Labs Lab 06/29/15 1845 06/30/15 0544  WBC 10.9* 11.7*  NEUTROABS 7.7 8.5*  HGB 11.2* 11.3*  HCT 37.1* 36.3*  MCV 85.3 84.2  PLT 250 231   Cardiac Enzymes: No results for input(s): CKTOTAL, CKMB, CKMBINDEX, TROPONINI in the last 168 hours. BNP (last 3 results) No results for input(s): PROBNP in the last 8760 hours. CBG:  Recent Labs Lab 06/30/15 0402 06/30/15 0749  GLUCAP 205* 150*   D-Dimer: No results  for input(s): DDIMER in the last 72 hours. Hgb A1c: No results for input(s): HGBA1C in the last 72 hours. Lipid Profile: No results for input(s): CHOL, HDL, LDLCALC, TRIG, CHOLHDL, LDLDIRECT in the last 72 hours. Thyroid function studies: No results for input(s): TSH, T4TOTAL, T3FREE, THYROIDAB in the last 72 hours.  Invalid input(s): FREET3 Anemia work up: No results for input(s): VITAMINB12, FOLATE, FERRITIN, TIBC, IRON, RETICCTPCT in the last 72 hours. Sepsis Labs:  Recent Labs Lab 06/29/15 1845 06/30/15 0544  WBC 10.9* 11.7*   Microbiology No results found for this or any previous visit (from the past 240 hour(s)).   Medications:   . aspirin EC  81 mg Oral Daily  . budesonide-formoterol  2 puff Inhalation BID  . donepezil  10 mg Oral q morning - 10a  . enoxaparin (LOVENOX) injection  30 mg Subcutaneous Daily  . fenofibrate  54 mg Oral Daily  . furosemide  20 mg Oral Daily  . insulin aspart  0-5 Units Subcutaneous QHS  . insulin aspart  0-9 Units Subcutaneous TID WC  . insulin glargine  10 Units Subcutaneous q morning - 10a  . memantine  10 mg Oral BID  . pantoprazole  20 mg Oral Daily  . pravastatin  40 mg Oral q1800  . sodium chloride  3 mL Intravenous  Q12H  . vancomycin  1,000 mg Intravenous Q24H   Continuous Infusions:   Time spent: 25 min   LOS: 1 day   Charlynne Cousins  [facility] Pager [phone]  *Please refer to [email or web address], password [email or web address] to get updated schedule on who will round on this patient, as hospitalists switch teams weekly. If 7PM-7AM, please contact night-coverage at [email or web address], password [email or web address] for any overnight needs.  06/30/2015, 10:12 AM

## 2015-06-30 NOTE — Care Management Note (Signed)
Case Management Note  Patient Details  Name: Antonio Ray MRN: [id number] Date of Birth: [birth year]  Subjective/Objective:      Aspiration pneumonia               Action/Plan: NCM spoke to pt and he lives at independently at home. States his [place], [place] # [id number] will assist with his care as needed. States he has a RW and oxygen at home. States he is using oxygen prn at home. Had [facility] in the past. [facility] and he has completed his services. Waiting final recommendations for home. Pt agreeable to [facility] RN with [facility] if [place] orders. States he does not need HH PT at this time.   Expected Discharge Date:  07/02/2015               Expected Discharge Plan:  Home/Self Care  In-House Referral:     Discharge planning Services  CM Consult   Status of Service:  Completed, signed off  Medicare Important Message Given:    Date Medicare IM Given:    Medicare IM give by:    Date Additional Medicare IM Given:    Additional Medicare Important Message give by:     If discussed at [place] of Stay Meetings, dates discussed:    Additional Comments:  Erenest Rasher, RN 06/30/2015, 11:53 AM

## 2015-07-01 LAB — GLUCOSE, CAPILLARY
GLUCOSE-CAPILLARY: 254 mg/dL — AB (ref 65–99)
GLUCOSE-CAPILLARY: 235 mg/dL — AB (ref 65–99)
GLUCOSE-CAPILLARY: 338 mg/dL — AB (ref 65–99)
Glucose-Capillary: 209 mg/dL — ABNORMAL HIGH (ref 65–99)

## 2015-07-01 LAB — LEGIONELLA PNEUMOPHILA SEROGP 1 UR AG: L. pneumophila Serogp 1 Ur Ag: NEGATIVE

## 2015-07-01 MED ORDER — ENOXAPARIN SODIUM 40 MG/0.4ML ~~LOC~~ SOLN
40.0000 mg | Freq: Every day | SUBCUTANEOUS | Status: DC
  Administered 2015-07-01 – 2015-07-02 (×2): 40 mg via SUBCUTANEOUS
  Filled 2015-07-01 (×2): qty 0.4

## 2015-07-01 MED ORDER — PIPERACILLIN-TAZOBACTAM 3.375 G IVPB
3.3750 g | Freq: Three times a day (TID) | INTRAVENOUS | Status: DC
  Administered 2015-07-01: 3.375 g via INTRAVENOUS
  Filled 2015-07-01 (×4): qty 50

## 2015-07-01 MED ORDER — PIPERACILLIN-TAZOBACTAM 3.375 G IVPB
3.3750 g | Freq: Three times a day (TID) | INTRAVENOUS | Status: DC
  Administered 2015-07-02 (×2): 3.375 g via INTRAVENOUS
  Filled 2015-07-01 (×4): qty 50

## 2015-07-01 MED ORDER — METHYLPREDNISOLONE SODIUM SUCC 40 MG IJ SOLR
40.0000 mg | Freq: Two times a day (BID) | INTRAMUSCULAR | Status: DC
  Administered 2015-07-01 – 2015-07-02 (×3): 40 mg via INTRAVENOUS
  Filled 2015-07-01 (×3): qty 1

## 2015-07-01 NOTE — Progress Notes (Signed)
[facility] PROGRESS NOTE    Progress Note   Antonio Ray [id number] DOB: [birth year] DOA: 06/29/2015 PCP: [name], MD   Brief Narrative:   Antonio Ray is an 81 y.o. male past history of chronic systolic heart failure, COPD not oxygen dependent, diabetes mellitus type 2, defibrillator present since 2012, and esophageal dysmotility with achalasia status post dilation several years back, recently discharged from the hospital on October 2016 for right lower lobe pneumonia requiring intubation on a prolonged stay. They comes into the hospital for productive cough for the green-yellow sputum for the last 3 days chest x-ray in the ED showed new left lower lobe infiltrate.  Assessment/Plan:   SepsisAspiration pneumonia ([name])/HCAP (healthcare-associated pneumonia): - treating for healthcare associated pneumonia although could be an aspiration episode leading to this pneumonia. -seen by GI in office on 11/10: importance of eating slowly, taking small bites and chewing very well. I recommended that he prop the head of his bed slightly at night. He will continue daily PPI. We discussed repeat Botox which can be offered but can also result in scarring and even stricturing. He may be a candidate for pneumatic dilation and I will discuss this with Dr. Silverio Decamp. Pending her opinion, decision to be made regarding pneumatic dilation versus repeat Botox when symptoms return. Swallowing evaluation done 05/12/2015 showed esophageal dysphagia with high risk of aspiration when patient swallows any solids. Son refused to meet with palliative care.  Patient and son understand the risk and benefit of eating, they will like to proceed with DYS diet. -most food comes from [employer] caf[employer]-daughter brings  Renal failure (ARF), acute on chronic ([name]) stage III: Baseline creatinine is around 1.4, on admission was 2.0 is improving slowly with IV fluid hydration. Hold diuretics.  Diabetes mellitus type  II, non insulin dependent ([place]): Continue current regimen blood glucose is improving.  Achalasia: -see above comments by GI ([place])  COPD exacerbation ([place]) - Patient is moving poor air he is currently not wheezing -wean IV steroids  Cardiomyopathy, ischemic/chronic systolic heart failure: Seems to be euvolemic. Hold diuretics as new acute renal failure.  Type 2 diabetes mellitus with complication, with long-term current use of insulin ([place]): Currently on long-acting insulin plus sliding scale -increase his long-acting to twice a day and double the dose as he is to be started on Solu-Medrol.    DVT Prophylaxis - Lovenox ordered.  Family Communication: patient Disposition Plan: PT eval--- also weaning steroids-- suspect will be 1-2 more days Code Status: full     IV Access:    Peripheral IV   Procedures and diagnostic studies:   Dg Chest 2 View  06/29/2015  CLINICAL DATA:  White-ish yellow productive cough, malaise X 5-6 days, feels feverish hx of diabetes, HTN, heart attack in 2011 EXAM: CHEST  2 VIEW COMPARISON:  05/12/2015 FINDINGS: Hyperinflation. Lateral view degraded by patient arm position. Patient rotated minimally right on the frontal. Mild cardiomegaly. Pacer/ AICD device. Atherosclerosis in the transverse aorta. No pleural effusion or pneumothorax. Subtle retrocardiac opacity on the lateral view, favored to correspond to medial left lower lobe airspace disease on the frontal. Interval clearing of the right lower lobe airspace disease. IMPRESSION: Interval clearing of right lower lobe airspace disease/ pneumonia. subtle medial left lower lobe opacity, suspicious for new infection. Mild cardiomegaly with atherosclerosis and mild hyperinflation. Electronically Signed   By: Abigail Miyamoto M.D.   On: 06/29/2015 20:16     Medical Consultants:    None.  Anti-Infectives:  Anti-infectives    Start     Dose/Rate Route Frequency Ordered Stop   06/30/15 2200   vancomycin (VANCOCIN) IVPB 1000 mg/200 mL premix     1,000 mg 200 mL/hr over 60 Minutes Intravenous Every 24 hours 06/29/15 2126     06/29/15 2200  vancomycin (VANCOCIN) 1,250 mg in sodium chloride 0.9 % 250 mL IVPB     1,250 mg 166.7 mL/hr over 90 Minutes Intravenous  Once 06/29/15 2126 06/29/15 2258   06/29/15 2130  piperacillin-tazobactam (ZOSYN) IVPB 3.375 g     3.375 g 100 mL/hr over 30 Minutes Intravenous  Once 06/29/15 2119 06/29/15 2258   06/29/15 2115  levofloxacin (LEVAQUIN) IVPB 750 mg  Status:  Discontinued     750 mg 100 mL/hr over 90 Minutes Intravenous  Once 06/29/15 2111 06/29/15 2115      Subjective:    Breathing better Wants to get up and walk  Objective:    Filed Vitals:   06/30/15 1953 06/30/15 2032 07/01/15 0427 07/01/15 0808  BP:  118/69 121/42 115/47  Pulse:  75 60 63  Temp:  97.5 F (36.4 C) 97.9 F (36.6 C) 97.7 F (36.5 C)  TempSrc:  Oral Oral Oral  Resp:  18 18 18   Height:      Weight:      SpO2: 96% 96% 98% 97%    Intake/Output Summary (Last 24 hours) at 07/01/15 0830 Last data filed at 07/01/15 [id number]  Gross per 24 hour  Intake    980 ml  Output   1400 ml  Net   -420 ml   Filed Weights   06/29/15 2100  Weight: 87 kg (191 lb 12.8 oz)    Exam: Gen:  Awake, pleasant/cooperative Cardiovascular:  RRR. Chest and lungs:   Moderate air movement, no wheezing. Abdomen:  Abdomen soft, NT/ND, + BS Extremities:  No C/E/C   Data Reviewed:    Labs: Basic Metabolic Panel:  Recent Labs Lab 06/29/15 1845 06/30/15 0544 06/30/15 2318  NA 139 142  --   K 4.1 4.0  --   CL 104 108  --   CO2 24 26  --   GLUCOSE 208* 192* 403*  BUN 50* 40*  --   CREATININE 2.08* 1.84*  --   CALCIUM 9.1 9.1  --    GFR Estimated Creatinine Clearance: 33.6 mL/min (by C-G formula based on Cr of 1.84). Liver Function Tests:  Recent Labs Lab 06/29/15 1845 06/30/15 0544  AST 21 19  ALT 17 17  ALKPHOS 38 36*  BILITOT 0.6 0.5  PROT 6.3* 6.2*    ALBUMIN 3.0* 2.7*   No results for input(s): LIPASE, AMYLASE in the last 168 hours. No results for input(s): AMMONIA in the last 168 hours. Coagulation profile No results for input(s): INR, PROTIME in the last 168 hours.  CBC:  Recent Labs Lab 06/29/15 1845 06/30/15 0544  WBC 10.9* 11.7*  NEUTROABS 7.7 8.5*  HGB 11.2* 11.3*  HCT 37.1* 36.3*  MCV 85.3 84.2  PLT 250 231   Cardiac Enzymes: No results for input(s): CKTOTAL, CKMB, CKMBINDEX, TROPONINI in the last 168 hours. BNP (last 3 results) No results for input(s): PROBNP in the last 8760 hours. CBG:  Recent Labs Lab 06/30/15 0749 06/30/15 1223 06/30/15 1726 06/30/15 2151 07/01/15 0733  GLUCAP 150* 162* 210* 428* 254*   D-Dimer: No results for input(s): DDIMER in the last 72 hours. Hgb A1c: No results for input(s): HGBA1C in the last 72 hours. Lipid Profile: No  results for input(s): CHOL, HDL, LDLCALC, TRIG, CHOLHDL, LDLDIRECT in the last 72 hours. Thyroid function studies: No results for input(s): TSH, T4TOTAL, T3FREE, THYROIDAB in the last 72 hours.  Invalid input(s): FREET3 Anemia work up: No results for input(s): VITAMINB12, FOLATE, FERRITIN, TIBC, IRON, RETICCTPCT in the last 72 hours. Sepsis Labs:  Recent Labs Lab 06/29/15 1845 06/30/15 0544  WBC 10.9* 11.7*   Microbiology No results found for this or any previous visit (from the past 240 hour(s)).   Medications:   . aspirin EC  81 mg Oral Daily  . budesonide-formoterol  2 puff Inhalation BID  . donepezil  10 mg Oral q morning - 10a  . enoxaparin (LOVENOX) injection  40 mg Subcutaneous Daily  . fenofibrate  54 mg Oral Daily  . insulin aspart  0-5 Units Subcutaneous QHS  . insulin aspart  0-9 Units Subcutaneous TID WC  . insulin glargine  10 Units Subcutaneous BID  . memantine  10 mg Oral BID  . methylPREDNISolone (SOLU-MEDROL) injection  40 mg Intravenous Q12H  . pantoprazole  20 mg Oral Daily  . pravastatin  40 mg Oral q1800  . sodium  chloride  3 mL Intravenous Q12H  . vancomycin  1,000 mg Intravenous Q24H   Continuous Infusions:   Time spent: 25 min   LOS: 2 days   [facility]  [facility] Pager [phone]  *Please refer to [email or web address], password [email or web address] to get updated schedule on who will round on this patient, as hospitalists switch teams weekly. If 7PM-7AM, please contact night-coverage at [email or web address], password [email or web address] for any overnight needs.  07/01/2015, 8:30 AM

## 2015-07-01 NOTE — Progress Notes (Signed)
ANTIBIOTIC CONSULT NOTE - FOLLOW UP  Pharmacy Consult for vancomycin; adding Zosyn Indication: HCAP  Allergies  Allergen Reactions  . Codeine Other (See Comments)    Gets very angry, disoriented  . Dilaudid [Hydromorphone Hcl] Other (See Comments)    VERY AGITATED, HOSTILE  . Flomax [Tamsulosin Hcl] Shortness Of Breath  . Morphine And Related Other (See Comments)    VERY AGITATED, HOSTILE  . Sulfa Antibiotics Shortness Of Breath  . Beta Adrenergic Blockers Other (See Comments)    ? disorientation  . Carvedilol Other (See Comments)    DISORIENTATION    Patient Measurements: Height: 5\' 10"  (177.8 cm) Weight: 191 lb 12.8 oz (87 kg) IBW/kg (Calculated) : 73  Vital Signs: Temp: 97.5 F (36.4 C) (11/30 1142) Temp Source: Oral (11/30 1142) BP: 116/60 mmHg (11/30 1142) Pulse Rate: 59 (11/30 1142) Intake/Output from previous day: 11/29 0701 - 11/30 0700 In: 980 [P.O.:480; I.V.:300; IV Piggyback:200] Out: 1400 [Urine:1400] Intake/Output from this shift: Total I/O In: 342 [P.O.:342] Out: 250 [Urine:250]  Labs:  Recent Labs  06/29/15 1845 06/30/15 0544  WBC 10.9* 11.7*  HGB 11.2* 11.3*  PLT 250 231  CREATININE 2.08* 1.84*   Estimated Creatinine Clearance: 33.6 mL/min (by C-G formula based on Cr of 1.84). No results for input(s): VANCOTROUGH, VANCOPEAK, VANCORANDOM, GENTTROUGH, GENTPEAK, GENTRANDOM, TOBRATROUGH, TOBRAPEAK, TOBRARND, AMIKACINPEAK, AMIKACINTROU, AMIKACIN in the last 72 hours.   Microbiology: No results found for this or any previous visit (from the past 720 hour(s)).  Anti-infectives    Start     Dose/Rate Route Frequency Ordered Stop   07/01/15 1400  piperacillin-tazobactam (ZOSYN) IVPB 3.375 g     3.375 g 12.5 mL/hr over 240 Minutes Intravenous 3 times per day 07/01/15 1359     06/30/15 2200  vancomycin (VANCOCIN) IVPB 1000 mg/200 mL premix     1,000 mg 200 mL/hr over 60 Minutes Intravenous Every 24 hours 06/29/15 2126     06/29/15 2200   vancomycin (VANCOCIN) 1,250 mg in sodium chloride 0.9 % 250 mL IVPB     1,250 mg 166.7 mL/hr over 90 Minutes Intravenous  Once 06/29/15 2126 06/29/15 2258   06/29/15 2130  piperacillin-tazobactam (ZOSYN) IVPB 3.375 g     3.375 g 100 mL/hr over 30 Minutes Intravenous  Once 06/29/15 2119 06/29/15 2258   06/29/15 2115  levofloxacin (LEVAQUIN) IVPB 750 mg  Status:  Discontinued     750 mg 100 mL/hr over 90 Minutes Intravenous  Once 06/29/15 2111 06/29/15 2115      Assessment: 81 y/o male with recent discharge in October admitted with post nasal drainage, weakness, and productive cough with white sputum. He was started on vancomycin and received one dose of Zosyn in the ED but this was not continued. Contacted Dr. Eliseo Squires about adding gram negative coverage - Zosyn added. Tmax is 99, WBC 11.7 (no new labs since yesterday). He has acute on chronic renal failure, SCr is 1.84 (baseline ~1.4), UOP good. Sputum culture is pending.  Zosyn x 1 in the ED 11/28, 11/30>>  Vancomycin 11/28 >>  Goal of Therapy:  Vancomycin trough level 15-20 mcg/ml  Eradication of infection  Plan:  - Continue vancomycin 1000 mg IV q24h - Zosyn 3.375 g IV q8h to be infused over 4 hours - Monitor renal function, clinical progress, and culture data  [facility], [place].D., BCPS Clinical Pharmacist Pager: [phone] 07/01/2015 2:07 PM

## 2015-07-02 ENCOUNTER — Other Ambulatory Visit: Payer: Self-pay | Admitting: Cardiology

## 2015-07-02 DIAGNOSIS — I255 Ischemic cardiomyopathy: Secondary | ICD-10-CM

## 2015-07-02 DIAGNOSIS — N179 Acute kidney failure, unspecified: Secondary | ICD-10-CM

## 2015-07-02 DIAGNOSIS — Z794 Long term (current) use of insulin: Secondary | ICD-10-CM

## 2015-07-02 DIAGNOSIS — E118 Type 2 diabetes mellitus with unspecified complications: Secondary | ICD-10-CM

## 2015-07-02 DIAGNOSIS — N189 Chronic kidney disease, unspecified: Secondary | ICD-10-CM

## 2015-07-02 DIAGNOSIS — J441 Chronic obstructive pulmonary disease with (acute) exacerbation: Secondary | ICD-10-CM

## 2015-07-02 DIAGNOSIS — J189 Pneumonia, unspecified organism: Secondary | ICD-10-CM

## 2015-07-02 DIAGNOSIS — K220 Achalasia of cardia: Secondary | ICD-10-CM

## 2015-07-02 DIAGNOSIS — J690 Pneumonitis due to inhalation of food and vomit: Secondary | ICD-10-CM

## 2015-07-02 LAB — BASIC METABOLIC PANEL
ANION GAP: 9 (ref 5–15)
BUN: 43 mg/dL — ABNORMAL HIGH (ref 6–20)
CALCIUM: 9.2 mg/dL (ref 8.9–10.3)
CO2: 26 mmol/L (ref 22–32)
Chloride: 103 mmol/L (ref 101–111)
Creatinine, Ser: 1.95 mg/dL — ABNORMAL HIGH (ref 0.61–1.24)
GFR, EST AFRICAN AMERICAN: 36 mL/min — AB (ref >60)
GFR, EST NON AFRICAN AMERICAN: 31 mL/min — AB (ref >60)
Glucose, Bld: 320 mg/dL — ABNORMAL HIGH (ref 65–99)
POTASSIUM: 4.9 mmol/L (ref 3.5–5.1)
Sodium: 138 mmol/L (ref 135–145)

## 2015-07-02 LAB — CBC
HEMATOCRIT: 36.6 % — AB (ref 39.0–52.0)
Hemoglobin: 11.3 g/dL — ABNORMAL LOW (ref 13.0–17.0)
MCH: 25.8 pg — ABNORMAL LOW (ref 26.0–34.0)
MCHC: 30.9 g/dL (ref 30.0–36.0)
MCV: 83.6 fL (ref 78.0–100.0)
PLATELETS: 281 10*3/uL (ref 150–400)
RBC: 4.38 MIL/uL (ref 4.22–5.81)
RDW: 15.3 % (ref 11.5–15.5)
WBC: 14.8 10*3/uL — AB (ref 4.0–10.5)

## 2015-07-02 LAB — GLUCOSE, CAPILLARY
GLUCOSE-CAPILLARY: 329 mg/dL — AB (ref 65–99)
Glucose-Capillary: 307 mg/dL — ABNORMAL HIGH (ref 65–99)

## 2015-07-02 MED ORDER — AMOXICILLIN-POT CLAVULANATE 875-125 MG PO TABS: 1.0000 | ORAL_TABLET | Freq: Two times a day (BID) | ORAL | Status: AC

## 2015-07-02 MED ORDER — LORATADINE 10 MG PO TABS: 10.0000 mg | ORAL_TABLET | Freq: Every day | ORAL | Status: AC

## 2015-07-02 MED ORDER — FUROSEMIDE 20 MG PO TABS: 40.0000 mg | ORAL_TABLET | Freq: Every day | ORAL | Status: DC

## 2015-07-02 MED ORDER — FUROSEMIDE 20 MG PO TABS: 60.0000 mg | ORAL_TABLET | Freq: Every day | ORAL | Status: DC

## 2015-07-02 MED ORDER — PREDNISONE 20 MG PO TABS: ORAL_TABLET | ORAL | Status: DC

## 2015-07-02 NOTE — Discharge Summary (Addendum)
Physician Discharge Summary  Antonio Ray [id number] DOB: 18-Dec-1933 DOA: 06/29/2015  PCP: Antonio Obey, MD  Admit date: 06/29/2015 Discharge date: 07/02/2015  Time spent: 40 minutes  Recommendations for Outpatient Follow-up:  1. Repeat CBC to follow WBC's trend 2. Repeat BMET to follow electrolytes and renal function; if stable resume losartan  3. Please reassess volume/HTN; adjust medications as needed    Discharge Diagnoses:  Principal Problem:   Aspiration pneumonia ([name]) Active Problems:   Diabetes mellitus type II, non insulin dependent ([name])   Renal failure (ARF), acute on chronic ([name])   HCAP (healthcare-associated pneumonia)   Achalasia   COPD exacerbation ([name])   Cardiomyopathy, ischemic   Type 2 diabetes mellitus with complication, with long-term current use of insulin (Antonio Ray)   Discharge Condition: stable and improved. Discharge home with instruction to follow with PCP and GI as an outpatient.  Diet recommendation: low sodium diet and modified carbohydrates   Filed Weights   06/29/15 2100  Weight: 87 kg (191 lb 12.8 oz)    History of present illness:  81 y.o. male past history of chronic systolic heart failure, COPD not oxygen dependent, diabetes mellitus type 2, defibrillator present since 2012, and esophageal dysmotility with achalasia status post dilation several years back, recently discharged from the hospital on October 2016 for right lower lobe pneumonia requiring intubation on a prolonged stay. They comes into the hospital for productive cough for the green-yellow sputum for the last 3 days chest x-ray in the ED showed new left lower lobe infiltrate.  Hospital Course:  1-Sepsis due to HCAP/aspiration PNA: WBc's 18.1, temp 101.2, HR 124, RR 39; lactic acid 2.4 -excellent response to antibiotic therapy -will discharge home with PO antibiotics -advise to follow instructions to minimize further aspirations -will add loratadine daily to decrease post  nasal drip -sepsis features resolved -will follow with GI as an outpatient for further tx of his achalasia   2-Diabetes mellitus type II, insulin dependent:  -will resume home hypoglycemic regimen -will advise to follow low carb diet   3-COPD (chronic obstructive pulmonary disease) with acute exacerbation: no further wheezing on exam -improve air movement at discharge -will discharge on steroids tapering and abx's -will continue use of spiriva and PRN albuterol  4-acute CKD (chronic kidney disease) stage 3, GFR 30-59 ml/min: due to decrease perfusion and continue use of nephrotoxic agents -essentially back to baseline -will monitor trend of renal function -will hold cozaar until follow up with PCP and resume lasix at 40mg  daily -advise to maintain adequate hydration   5-Leukocytosis: due to sepsis and use of steroids -continue treatment with abx's -patient will initiate tapering process -follow CBC as an outpatient to reassess trend   6-HCAP (healthcare-associated pneumonia): -treated with vanc and zosyn for 4 days -at discharge will continue tx with augmentin and complete antibiotic therapy -last day of abx's will be on 07/09/15 -good O2 sat on RA  7-Achalasia: per GI -continue PPI -follow instructions to minimize aspiration -has appointment with GI on 12/29 for evaluation on pneumatic dilatation   8- NICM: -follow low sodium diet -patient advise to check weight on daily basis -last EF 15% -outpatient follow up with cardiology service -condition compensated   9-GERD: continue PPI  Procedures:  See below for x-ray reports   Consultations:  None   Discharge Exam: Filed Vitals:   07/01/15 2105 07/02/15 0512  BP: 102/48 123/52  Pulse: 60 57  Temp: 97.6 F (36.4 C) 98 F (36.7 C)  Resp: 18 18  General: afebrile, no CP and reports no SOB. Good O2 sat on RA Cardiovascular: S1 and S2, no rubs or gallops; no JVD Respiratory: good air movement, no wheezing and  just scattered rhonchi on exam Abd: soft, NT, ND, positive BS Extremities: no edema, no cyanosis   Discharge Instructions   Discharge Instructions    Diet - low sodium heart healthy    Complete by:  As directed      Discharge instructions    Complete by:  As directed   Take medications as prescribed Arrange follow up with PCP in 10 days Please minimize exposure to extreme temperatures  Warm/ room temp liquids/food; avoid cold temp on food (will help preventing spasm) Please use a humidifier along with the heating, to decrease airway dryness  Keep appointment with gastroenterologist as previously scheduled          Current Discharge Medication List    START taking these medications   Details  amoxicillin-clavulanate (AUGMENTIN) 875-125 MG tablet Take 1 tablet by mouth 2 (two) times daily. Qty: 14 tablet, Refills: 0    loratadine (CLARITIN) 10 MG tablet Take 1 tablet (10 mg total) by mouth daily. Qty: 30 tablet, Refills: 1    predniSONE (DELTASONE) 20 MG tablet Take 2 tablet by mouth daily X 1 days; then 1 tablet by mouth daily X 3 days; then 1/2 tablet by mouth daily X 3 days and stop prednisone Qty: 10 tablet, Refills: 0      CONTINUE these medications which have CHANGED   Details  furosemide (LASIX) 20 MG tablet Take 2 tablets (40 mg total) by mouth daily.      CONTINUE these medications which have NOT CHANGED   Details  aspirin 81 MG EC tablet Take 81 mg by mouth daily.      budesonide-formoterol (SYMBICORT) 160-4.5 MCG/ACT inhaler Inhale 2 puffs into the lungs 2 (two) times daily. Qty: 1 Inhaler, Refills: 12    Cholecalciferol (VITAMIN D PO) Take 2,000 Units by mouth daily.     donepezil (ARICEPT) 10 MG tablet Take 10 mg by mouth every morning.    fenofibrate (TRICOR) 145 MG tablet TAKE 1 TABLET BY MOUTH EVERY DAY Qty: 30 tablet, Refills: 6    insulin glargine (LANTUS) 100 UNIT/ML injection Inject 0.1 mLs (10 Units total) into the skin every morning. Qty: 10  mL, Refills: 11    memantine (NAMENDA) 10 MG tablet Take 10 mg by mouth 2 (two) times daily.    Multiple Vitamins-Minerals (PRESERVISION AREDS 2) CAPS Take 1 capsule by mouth 2 (two) times daily.    nitroGLYCERIN (NITRODUR - DOSED IN MG/24 HR) 0.2 mg/hr patch Place 1 patch (0.2 mg total) onto the skin daily. Qty: 30 patch, Refills: 2    pantoprazole (PROTONIX) 20 MG tablet TAKE 1 TABLET BY MOUTH EVERY DAY Qty: 90 tablet, Refills: 0    pravastatin (PRAVACHOL) 40 MG tablet TAKE 1 TABLET(40 MG) BY MOUTH EVERY EVENING Qty: 90 tablet, Refills: 0    PROVENTIL HFA 108 (90 BASE) MCG/ACT inhaler Inhale 2 puffs into the lungs every 6 (six) hours as needed for wheezing or shortness of breath. As directed    SPIRIVA HANDIHALER 18 MCG inhalation capsule INHALE CONTENTS OF 1 CAPSULE VIA HANDIHALER ONCE DAILY Qty: 30 capsule, Refills: 0      STOP taking these medications     losartan (COZAAR) 25 MG tablet        Allergies  Allergen Reactions  . Codeine Other (See Comments)  Gets very angry, disoriented  . Dilaudid [Hydromorphone Hcl] Other (See Comments)    VERY AGITATED, HOSTILE  . Flomax [Tamsulosin Hcl] Shortness Of Breath  . Morphine And Related Other (See Comments)    VERY AGITATED, HOSTILE  . Sulfa Antibiotics Shortness Of Breath  . Beta Adrenergic Blockers Other (See Comments)    ? disorientation  . Carvedilol Other (See Comments)    DISORIENTATION   Follow-up Information    Follow up with Antonio Obey, MD. Schedule an appointment as soon as possible for a visit in 10 days.   Specialty:  Family Medicine   Contact information:   [address] [place] [address] [phone]        The results of significant diagnostics from this hospitalization (including imaging, microbiology, ancillary and laboratory) are listed below for reference.    Significant Diagnostic Studies: Dg Chest 2 View  06/29/2015  CLINICAL DATA:  White-ish yellow productive  cough, malaise X 5-6 days, feels feverish hx of diabetes, HTN, heart attack in 2011 EXAM: CHEST  2 VIEW COMPARISON:  05/12/2015 FINDINGS: Hyperinflation. Lateral view degraded by patient arm position. Patient rotated minimally right on the frontal. Mild cardiomegaly. Pacer/ AICD device. Atherosclerosis in the transverse aorta. No pleural effusion or pneumothorax. Subtle retrocardiac opacity on the lateral view, favored to correspond to medial left lower lobe airspace disease on the frontal. Interval clearing of the right lower lobe airspace disease. IMPRESSION: Interval clearing of right lower lobe airspace disease/ pneumonia. subtle medial left lower lobe opacity, suspicious for new infection. Mild cardiomegaly with atherosclerosis and mild hyperinflation. Electronically Signed   By: Abigail Miyamoto M.D.   On: 06/29/2015 20:16    Microbiology: No results found for this or any previous visit (from the past 240 hour(s)).   Labs: Basic Metabolic Panel:  Recent Labs Lab 06/29/15 1845 06/30/15 0544 06/30/15 2318 07/02/15 0645  NA 139 142  --  138  K 4.1 4.0  --  4.9  CL 104 108  --  103  CO2 24 26  --  26  GLUCOSE 208* 192* 403* 320*  BUN 50* 40*  --  43*  CREATININE 2.08* 1.84*  --  1.95*  CALCIUM 9.1 9.1  --  9.2   Liver Function Tests:  Recent Labs Lab 06/29/15 1845 06/30/15 0544  AST 21 19  ALT 17 17  ALKPHOS 38 36*  BILITOT 0.6 0.5  PROT 6.3* 6.2*  ALBUMIN 3.0* 2.7*   CBC:  Recent Labs Lab 06/29/15 1845 06/30/15 0544 07/02/15 0645  WBC 10.9* 11.7* 14.8*  NEUTROABS 7.7 8.5*  --   HGB 11.2* 11.3* 11.3*  HCT 37.1* 36.3* 36.6*  MCV 85.3 84.2 83.6  PLT 250 231 281   BNP (last 3 results)  Recent Labs  04/02/15 1015 05/12/15 0853 06/29/15 1845  BNP 906.6* 136.5* 104.3*   CBG:  Recent Labs Lab 07/01/15 1139 07/01/15 1702 07/01/15 2210 07/02/15 0825 07/02/15 1235  GLUCAP 235* 209* 338* 307* 329*    Signed:  Barton Dubois  [facility] 07/02/2015,  1:40 PM

## 2015-07-02 NOTE — Care Management Important Message (Signed)
Important Message  Patient Details  Name: Antonio Ray MRN: [id number] Date of Birth: 06/09/1934   Medicare Important Message Given:  Yes    Seung Nidiffer Abena 07/02/2015, 1:09 PM

## 2015-07-02 NOTE — Telephone Encounter (Signed)
REFILL 

## 2015-07-02 NOTE — Evaluation (Signed)
Physical Therapy Evaluation [name] MRN: [id number] DOB: Dec 29, 1933 Today's Date: 07/02/2015   History of Present Illness  Patient is a 81 y/o male presents with productive cough with green-yellow sputum. CXR showed new left lower lobe infiltrate. PMH includes COPD, CAD, HFpEF s/p AICD, DM, intubated 9/1-9/3 2/2 to respiratory failure.  Clinical Impression  Patient presents with mild balance deficits and dyspnea on exertion impacting mobility. Tolerated gait training and stair training with supervision for safety. Pt tolerated higher level balance challenges but deviations in gait noted. Would benefit from OPPT for higher level balance activities as pt lives alone. Will follow acutely to maximize independence and mobility prior to return home.     Follow Up Recommendations Outpatient PT (for balance impairments)    Equipment Recommendations  None recommended by PT    Recommendations for Other Services OT consult     Precautions / Restrictions Precautions Precautions: Fall Precaution Comments: monitor 02 Restrictions Weight Bearing Restrictions: No      Mobility  Bed Mobility               General bed mobility comments: Pt in chair at start and end of PT session  Transfers Overall transfer level: Needs assistance Equipment used: None Transfers: Sit to/from Stand Sit to Stand: Modified independent (Device/Increase time)         General transfer comment: Stood from recliner x1. Good placement and safety with this task and no assist needed.  Ambulation/Gait Ambulation/Gait assistance: Supervision Ambulation Distance (Feet): 350 Feet Assistive device: None Gait Pattern/deviations: Step-through pattern;Drifts right/left   Gait velocity interpretation: at or above normal speed for age/gender General Gait Details: Supervision for safety without assistive device. Occasionally reaches for rail with minimal drift/sway noted. Cues for pursed  lip breathing. Sp02 dropped to 88% on RA, resolved within seconds.  Stairs Stairs: Yes Stairs assistance: Supervision Stair Management: One rail Right;Alternating pattern Number of Stairs: 4 General stair comments: Cues for technique and safety. Able to alternate steps going up, and uses step-to approach on descent. No physical assist required.  Wheelchair Mobility    Modified Rankin (Stroke Patients Only)       Balance Overall balance assessment: Needs assistance Sitting-balance support: Feet supported;No upper extremity supported Sitting balance-Leahy Scale: Good     Standing balance support: During functional activity Standing balance-Leahy Scale: Good               High level balance activites: Head turns;Sudden stops;Direction changes;Turns High Level Balance Comments: Tolerated above challenges with only mild deviations in gait but no LOB. Able to perform 360 degree turn and step over objects.             Pertinent Vitals/Pain Pain Assessment: No/denies pain    Home Living Family/patient expects to be discharged to:: Private residence Living Arrangements: Alone Available Help at Discharge: Family;Available PRN/intermittently (daughter 5 houses down the street but works during the day) Type of Home: House Home Access: Stairs to enter Entrance Stairs-Rails: Can reach both Technical brewer of Steps: [place]: Two level;Able to live on main level with bedroom/bathroom Home Equipment: Gilford Rile - 2 wheels Additional Comments: Loves to travel with his son.    Prior Function Level of Independence: Independent               Hand Dominance        Extremity/Trunk Assessment   Upper Extremity Assessment: Defer to OT evaluation;Overall [facility] for tasks assessed  Lower Extremity Assessment: Overall WFL for tasks assessed         Communication   Communication: HOH  Cognition Arousal/Alertness: Awake/alert Behavior During  Therapy: WFL for tasks assessed/performed Overall Cognitive Status: No family/caregiver present to determine baseline cognitive functioning       Memory: Decreased short-term memory              General Comments      Exercises        Assessment/Plan    PT Assessment Patient needs continued PT services  PT Diagnosis Difficulty walking   PT Problem List Cardiopulmonary status limiting activity;Decreased activity tolerance;Decreased balance;Decreased mobility;Decreased safety awareness  PT Treatment Interventions Balance training;Gait training;Stair training;Functional mobility training;Therapeutic activities;Therapeutic exercise;Patient/family education   PT Goals (Current goals can be found in the Care Plan section) Acute Rehab PT Goals Patient Stated Goal: to stay independent PT Goal Formulation: With patient Time For Goal Achievement: 07/16/15 Potential to Achieve Goals: Good    Frequency Min 3X/week   Barriers to discharge Inaccessible home environment;Decreased caregiver support steps to enter home and intermittent assist available at home    Co-evaluation               End of Session Equipment Utilized During Treatment: Gait belt Activity Tolerance: Patient tolerated treatment well Patient left: in chair;with call bell/phone within reach;with chair alarm set Nurse Communication: Mobility status         Time: [id number] PT Time Calculation (min) (ACUTE ONLY): 15 min   Charges:   PT Evaluation $Initial PT Evaluation Tier I: 1 Procedure     PT G Codes:        Wafaa Deemer A Adelei Scobey 07/02/2015, 9:04 AM Wray Kearns, PT, DPT [phone]

## 2015-07-02 NOTE — Care Management Note (Signed)
Case Management Note  Patient Details  Name: Antonio Ray MRN: [id number] Date of Birth: 01/30/1934  Subjective/Objective:                 Independent patient from home who receives support from his son to drive him to appointments. Patient denies any CM resources, but will require Outpatient PT. Referral made to [facility]. Patient given map, and entered in AVS.  Action/Plan:   Expected Discharge Date:                  Expected Discharge Plan:  Home/Self Care  In-House Referral:     Discharge planning Services  CM Consult  Post Acute Care Choice:    Choice offered to:     DME Arranged:    DME Agency:     HH Arranged:    [place] Agency:     Status of Service:  Completed, signed off  Medicare Important Message Given:  Yes Date Medicare IM Given:    Medicare IM give by:    Date Additional Medicare IM Given:    Additional Medicare Important Message give by:     If discussed at [place] of Stay Meetings, dates discussed:    Additional Comments:  Carles Collet, RN 07/02/2015, 3:08 PM

## 2015-07-02 NOTE — Progress Notes (Addendum)
Pharmacist Provided - Patient Medication Education Prior to Discharge   Antonio Ray is an 81 y.o. male who presented to [facility] on 06/29/2015 with a chief complaint of  Chief Complaint  Patient presents with  . Fatigue  . Cough     [x]  Patient will be discharged with 3 new medications []  Patient being discharged without any new medications  The following medications were discussed with the patient:  Prednisone, augmentin, loratadine, furosemide, losartan   Pain Control medications: []  Yes    []  No  Diabetes Medications: []  Yes    []  No  Heart Failure Medications: [x]  Yes    []  No  Anticoagulation Medications:  []  Yes    []  No  Antibiotics at discharge: [x]  Yes    []  No  Allergy Assessment Completed and Updated: [x]  Yes    []  No Identified Patient Allergies:  Allergies  Allergen Reactions  . Codeine Other (See Comments)    Gets very angry, disoriented  . Dilaudid [Hydromorphone Hcl] Other (See Comments)    VERY AGITATED, HOSTILE  . Flomax [Tamsulosin Hcl] Shortness Of Breath  . Morphine And Related Other (See Comments)    VERY AGITATED, HOSTILE  . Sulfa Antibiotics Shortness Of Breath  . Beta Adrenergic Blockers Other (See Comments)    ? disorientation  . Carvedilol Other (See Comments)    DISORIENTATION     Medication Adherence Assessment: [x]  Excellent (no doses missed/week)      []  Good (1 dose missed/week)      []  Partial (2-3 doses missed/week)      []  Poor (>3 doses missed/week)  Barriers to Obtaining Medications: []  Yes [x]  No  Antonio Ray expressed no concern with obtaining medications   Assessment: Patient's son helps him with his medications. Gave pt a brief summary of new medications and possible side effects. Was concerned for increase in lasix dose; discrepancy between dose prescribed and MD note. Discussed with MD and was entered in error based on previous improper documentation in pt chart. Will correct and reorder.   Son arrived at  patient's room. Spoke with son per patient's request.   Time spent preparing for discharge counseling: 15 min Time spent counseling patient: 23 min  Antonio Ray, PharmD 07/02/2015, 3:30 PM

## 2015-07-02 NOTE — Progress Notes (Signed)
Inpatient Diabetes Program Recommendations  AACE/ADA: New Consensus Statement on Inpatient Glycemic Control (2015)  Target Ranges:  Prepandial:   less than 140 mg/dL      Peak postprandial:   less than 180 mg/dL (1-2 hours)      Critically ill patients:  140 - 180 mg/dL   Review of Glycemic Control  Inpatient Diabetes Program Recommendations:  Correction (SSI): increase to resistant during steroid therapy  Thank you  Raoul Pitch BSN, RN,CDE Inpatient Diabetes Coordinator [phone] (team pager)

## 2015-07-06 ENCOUNTER — Ambulatory Visit (INDEPENDENT_AMBULATORY_CARE_PROVIDER_SITE_OTHER): Payer: Medicare Other | Admitting: Cardiology

## 2015-07-06 ENCOUNTER — Encounter: Payer: Self-pay | Admitting: Cardiology

## 2015-07-06 VITALS — BP 110/60 | HR 82 | Ht 70.0 in | Wt 187.4 lb

## 2015-07-06 DIAGNOSIS — I5022 Chronic systolic (congestive) heart failure: Secondary | ICD-10-CM

## 2015-07-06 DIAGNOSIS — I255 Ischemic cardiomyopathy: Secondary | ICD-10-CM | POA: Diagnosis not present

## 2015-07-06 DIAGNOSIS — Z95810 Presence of automatic (implantable) cardiac defibrillator: Secondary | ICD-10-CM

## 2015-07-06 DIAGNOSIS — I2510 Atherosclerotic heart disease of native coronary artery without angina pectoris: Secondary | ICD-10-CM | POA: Diagnosis not present

## 2015-07-06 DIAGNOSIS — N179 Acute kidney failure, unspecified: Secondary | ICD-10-CM | POA: Diagnosis not present

## 2015-07-06 DIAGNOSIS — N183 Chronic kidney disease, stage 3 unspecified: Secondary | ICD-10-CM

## 2015-07-06 LAB — CUP PACEART REMOTE DEVICE CHECK
Battery Remaining Percentage: 54 %
Date Time Interrogation Session: 20161205121549
HIGH POWER IMPEDANCE MEASURED VALUE: 82 Ohm
Implantable Lead Implant Date: 20120829
Implantable Lead Implant Date: 20120829
Implantable Lead Location: 753859
Lead Channel Impedance Value: 400 Ohm
Lead Channel Impedance Value: 400 Ohm
Lead Channel Sensing Intrinsic Amplitude: 2 mV
MDC IDC LEAD LOCATION: 753860
MDC IDC MSMT BATTERY REMAINING LONGEVITY: 50 mo
MDC IDC MSMT LEADCHNL RV SENSING INTR AMPL: 12 mV
MDC IDC STAT BRADY RA PERCENT PACED: 35 %
MDC IDC STAT BRADY RV PERCENT PACED: 1 % — AB
Pulse Gen Serial Number: 828604

## 2015-07-06 NOTE — Patient Instructions (Signed)
Continue your current therapy  Your primary care will recheck your kidney function. If it is back at baseline we will resume losartan 25 mg daily  I will see you in 3 months.

## 2015-07-06 NOTE — Progress Notes (Signed)
Antonio Ray Date of Birth: 11/11/1933 Medical Record #[id number]  History of Present Illness: Antonio Ray is seen for follow up CAD and CHF.  He has a history of coronary disease with remote myocardial infarction in 1994. He had emergent angioplasty of the LAD. Cardiac catheterization 2007 showed nonobstructive disease. He has an ischemic cardiomyopathy with ejection fraction of 30-35%. His last Myoview in 2011 showed an anterior wall scar without ischemia. He has an ICD in place. Most recent Echo in September 2016 showed EF had dropped to 10-15% with global hypokinesis and akinesis of the anteroseptum. Recently he was admitted in June, July, and 2 weeks ago with recurrent PNA. There is concern that this is related to aspiration.. He had a barium swallow that showed achalasia. Apparently manography confirmed this. He is being considered for pneumatic dilitation by GI.  he apparently developed acute memory loss this spring as well and is now on Aricept and Namenda.  On follow up today he states breathing is better. Minimal cough. Losartan stopped in hospital due to elevation in creatinine to 2.08. He is on tapering steroids.   Current Outpatient Prescriptions on File Prior to Visit  Medication Sig Dispense Refill  . amoxicillin-clavulanate (AUGMENTIN) 875-125 MG tablet Take 1 tablet by mouth 2 (two) times daily. 14 tablet 0  . aspirin 81 MG EC tablet Take 81 mg by mouth daily.      . budesonide-formoterol (SYMBICORT) 160-4.5 MCG/ACT inhaler Inhale 2 puffs into the lungs 2 (two) times daily. 1 Inhaler 12  . Cholecalciferol (VITAMIN D PO) Take 2,000 Units by mouth daily.     Marland Kitchen donepezil (ARICEPT) 10 MG tablet Take 10 mg by mouth every morning.    . fenofibrate (TRICOR) 145 MG tablet TAKE 1 TABLET BY MOUTH EVERY DAY 30 tablet 6  . furosemide (LASIX) 20 MG tablet Take 2 tablets (40 mg total) by mouth daily.    . insulin glargine (LANTUS) 100 UNIT/ML injection Inject 0.1 mLs (10 Units total) into  the skin every morning. 10 mL 11  . loratadine (CLARITIN) 10 MG tablet Take 1 tablet (10 mg total) by mouth daily. 30 tablet 1  . memantine (NAMENDA) 10 MG tablet Take 10 mg by mouth 2 (two) times daily.    . Multiple Vitamins-Minerals (PRESERVISION AREDS 2) CAPS Take 1 capsule by mouth 2 (two) times daily.    . nitroGLYCERIN (NITRODUR - DOSED IN MG/24 HR) 0.2 mg/hr patch Place 1 patch (0.2 mg total) onto the skin daily. 30 patch 2  . pantoprazole (PROTONIX) 20 MG tablet TAKE 1 TABLET BY MOUTH EVERY DAY 90 tablet 0  . pravastatin (PRAVACHOL) 40 MG tablet TAKE 1 TABLET(40 MG) BY MOUTH EVERY EVENING 30 tablet 11  . predniSONE (DELTASONE) 20 MG tablet Take 2 tablet by mouth daily X 1 days; then 1 tablet by mouth daily X 3 days; then 1/2 tablet by mouth daily X 3 days and stop prednisone 10 tablet 0  . PROVENTIL HFA 108 (90 BASE) MCG/ACT inhaler Inhale 2 puffs into the lungs every 6 (six) hours as needed for wheezing or shortness of breath. As directed    . SPIRIVA HANDIHALER 18 MCG inhalation capsule INHALE CONTENTS OF 1 CAPSULE VIA HANDIHALER ONCE DAILY 30 capsule 0   No current facility-administered medications on file prior to visit.    Allergies  Allergen Reactions  . Codeine Other (See Comments)    Gets very angry, disoriented  . Dilaudid [Hydromorphone Hcl] Other (See Comments)  VERY AGITATED, HOSTILE  . Flomax [Tamsulosin Hcl] Shortness Of Breath  . Morphine And Related Other (See Comments)    VERY AGITATED, HOSTILE  . Sulfa Antibiotics Shortness Of Breath  . Beta Adrenergic Blockers Other (See Comments)    ? disorientation  . Carvedilol Other (See Comments)    DISORIENTATION    Past Medical History  Diagnosis Date  . GERD (gastroesophageal reflux disease)   . Coronary artery disease     History of remote anterior MI with PCI to LAD in 2006; most recent cath 2007, no intervention required  . Chronic systolic dysfunction of left ventricle     EF 30-35%, CLASS II - III  SYMPTOMS; intolerant to Coreg  . Hyperlipidemia   . PVC's (premature ventricular contractions)   . BPH (benign prostatic hyperplasia)   . CHF (congestive heart failure) ([place])   . COPD (chronic obstructive pulmonary disease) ([place])   . Heart murmur     hx  . Pneumonia "several times"    aspiration pna with at least 3 admits for this 2016.   . Type II diabetes mellitus ([place])   . CKD (chronic kidney disease) stage 3, GFR 30-59 ml/min 06/08/2012  . Skin cancer "several"    "forearms; head"  . Memory loss   . Anginal pain ([place])   . Dysrhythmia   . Myocardial infarction ([place]) 1994    ANTERIOR  . AICD (automatic cardioverter/defibrillator) present 03/30/2011    Analyze ST study patient  . DOE (dyspnea on exertion)     with heavy exertion  . Achalasia   . Renal failure   . Esophageal dysmotility     Past Surgical History  Procedure Laterality Date  . Foot fracture surgery Right 1980's  . Cholecystectomy  2/11  . Cataract extraction w/ intraocular lens  implant, bilateral  08/2014-09/2014  . Cardiac catheterization  01/11/2006    DEMONSTRATES AKINESIA OF THE DISTAL ANTERIOR WALL, DISTAL INFERIOR WALL AND AKINESIA OF THE APEX. THE BASAL SEGMENTS CONTRACT WELL AND OVERALL EF 35%  . Coronary angioplasty  1994    TO THE LAD  . Skin cancer excision  "several"    "forearms, head"  . Esophageal manometry N/A 03/16/2015    Procedure: ESOPHAGEAL MANOMETRY (EM);  Surgeon: Jerene Bears, MD;  Location: [facility];  Service: Gastroenterology;  Laterality: N/A;  . Insert / replace / remove pacemaker    . Esophagogastroduodenoscopy N/A 04/08/2015    Procedure: ESOPHAGOGASTRODUODENOSCOPY (EGD);  Surgeon: Milus Banister, MD;  Location: [place];  Service: Endoscopy;  Laterality: N/A;  . Botox injection N/A 04/08/2015    Procedure: BOTOX INJECTION;  Surgeon: Milus Banister, MD;  Location: [place];  Service: Endoscopy;  Laterality: N/A;    History  Smoking status  . Former Smoker -- 1.00  packs/day for 35 years  . Types: Cigarettes  . Quit date: 08/01/1992  Smokeless tobacco  . Never Used    History  Alcohol Use  . 0.0 oz/week  . 0 Standard drinks or equivalent per week    Comment: Approximately a beer every 2 years    Family History  Problem Relation Age of Onset  . Stroke Father     Mother history unknown - never met her  . Colon cancer Neg Hx     Review of Systems: As noted in history of present illness. All other systems were reviewed and are negative.  Physical Exam: BP 110/60 mmHg  Pulse 82  Ht 5' 10"  (1.778 m)  Wt  85.021 kg (187 lb 7 oz)  BMI 26.89 kg/m2 Patient is very pleasant and in no acute distress. Skin is warm and dry. Color is normal.  HEENT is unremarkable. Normocephalic/atraumatic. PERRL. Sclera are nonicteric. Neck is supple. No masses. No JVD. Lungs are clear except for few squeeks. Cardiac exam shows a regular rate and rhythm. Abdomen is soft and obese. No masses or bruits . Extremities are without edema.  pedal pulses are good. Gait and ROM are intact. No gross neurologic deficits noted.   LABORATORY DATA: Lab Results  Component Value Date   WBC 14.8* 07/02/2015   HGB 11.3* 07/02/2015   HCT 36.6* 07/02/2015   PLT 281 07/02/2015   GLUCOSE 320* 07/02/2015   CHOL 136 06/20/2013   TRIG 142.0 06/20/2013   HDL 32.00* 06/20/2013   LDLCALC 76 06/20/2013   ALT 17 06/30/2015   AST 19 06/30/2015   NA 138 07/02/2015   K 4.9 07/02/2015   CL 103 07/02/2015   CREATININE 1.95* 07/02/2015   BUN 43* 07/02/2015   CO2 26 07/02/2015   INR 1.27 03/31/2015   HGBA1C 8.5* 03/22/2015    BNP 06/29/15 104.   Echo: 04/02/15:Study Conclusions  - Left ventricle: The cavity size was normal. Wall thickness was increased in a pattern of mild LVH. Systolic function was severely reduced. The estimated ejection fraction was in the range of 10% to 15%. Diffuse hypokinesis. There is akinesis of the anteroseptal myocardium. Doppler parameters are  consistent with high ventricular filling pressure. Acoustic contrast opacification revealed no evidence ofthrombus. - Right ventricle: The cavity size was mildly dilated. Wall thickness was normal. Systolic function was moderately reduced.  Impressions:  - Since prior echocardiogram, EF is reduced.   Assessment / Plan: 1. Coronary disease with remote anterior myocardial infarction in 1994. Cardiac catheterization 2007 showed nonobstructive disease. His last stress test in October 2011 showed no ischemia. We will continue with his current medical therapy including aspirin. He has a history of intolerance to carvedilol and nitrates because of dizziness.  2. Ischemic cardiomyopathy with chronic systolic CHF. Last EF 10-15%. He is asymptomatic. He appears to be euvolemic. We'll continue with his current diuretic dose. ARB on hold due to acute rise in creatinine. Baseline creatinine 1.4-1.8 this year. Weight is stable. Primary care to follow up renal panel next week. If back to baseline would resume losartan 25 mg daily.  3. Prophylactic ICD.  4. Hyperlipidemia.   5. Chronic kidney disease stage III.   6. Recurrent PNA- possibly related to aspiration. Followed by pulmonary and GI.  7. Achalasia. Per GI.

## 2015-07-07 ENCOUNTER — Encounter: Payer: Self-pay | Admitting: Cardiology

## 2015-07-13 ENCOUNTER — Emergency Department (HOSPITAL_COMMUNITY)
Admission: EM | Admit: 2015-07-13 | Discharge: 2015-07-13 | Disposition: A | Discharge status: Home / Self Care | Payer: Medicare Other | Attending: Emergency Medicine | Admitting: Emergency Medicine

## 2015-07-13 ENCOUNTER — Emergency Department (HOSPITAL_COMMUNITY): Payer: Medicare Other

## 2015-07-13 ENCOUNTER — Encounter (HOSPITAL_COMMUNITY): Payer: Self-pay | Admitting: *Deleted

## 2015-07-13 DIAGNOSIS — Z87438 Personal history of other diseases of male genital organs: Secondary | ICD-10-CM | POA: Insufficient documentation

## 2015-07-13 DIAGNOSIS — Z794 Long term (current) use of insulin: Secondary | ICD-10-CM | POA: Diagnosis not present

## 2015-07-13 DIAGNOSIS — Z7951 Long term (current) use of inhaled steroids: Secondary | ICD-10-CM | POA: Diagnosis not present

## 2015-07-13 DIAGNOSIS — Z85828 Personal history of other malignant neoplasm of skin: Secondary | ICD-10-CM | POA: Diagnosis not present

## 2015-07-13 DIAGNOSIS — Z7982 Long term (current) use of aspirin: Secondary | ICD-10-CM | POA: Insufficient documentation

## 2015-07-13 DIAGNOSIS — E1165 Type 2 diabetes mellitus with hyperglycemia: Secondary | ICD-10-CM | POA: Insufficient documentation

## 2015-07-13 DIAGNOSIS — Z8719 Personal history of other diseases of the digestive system: Secondary | ICD-10-CM | POA: Diagnosis not present

## 2015-07-13 DIAGNOSIS — I25119 Atherosclerotic heart disease of native coronary artery with unspecified angina pectoris: Secondary | ICD-10-CM | POA: Insufficient documentation

## 2015-07-13 DIAGNOSIS — Z79899 Other long term (current) drug therapy: Secondary | ICD-10-CM | POA: Diagnosis not present

## 2015-07-13 DIAGNOSIS — Z8701 Personal history of pneumonia (recurrent): Secondary | ICD-10-CM | POA: Diagnosis not present

## 2015-07-13 DIAGNOSIS — I252 Old myocardial infarction: Secondary | ICD-10-CM | POA: Diagnosis not present

## 2015-07-13 DIAGNOSIS — Z9889 Other specified postprocedural states: Secondary | ICD-10-CM | POA: Insufficient documentation

## 2015-07-13 DIAGNOSIS — R739 Hyperglycemia, unspecified: Secondary | ICD-10-CM | POA: Diagnosis not present

## 2015-07-13 DIAGNOSIS — R7309 Other abnormal glucose: Secondary | ICD-10-CM | POA: Diagnosis not present

## 2015-07-13 DIAGNOSIS — J449 Chronic obstructive pulmonary disease, unspecified: Secondary | ICD-10-CM | POA: Diagnosis not present

## 2015-07-13 DIAGNOSIS — Z95810 Presence of automatic (implantable) cardiac defibrillator: Secondary | ICD-10-CM | POA: Insufficient documentation

## 2015-07-13 DIAGNOSIS — Z87891 Personal history of nicotine dependence: Secondary | ICD-10-CM | POA: Insufficient documentation

## 2015-07-13 DIAGNOSIS — Z9861 Coronary angioplasty status: Secondary | ICD-10-CM | POA: Insufficient documentation

## 2015-07-13 DIAGNOSIS — R918 Other nonspecific abnormal finding of lung field: Secondary | ICD-10-CM | POA: Diagnosis not present

## 2015-07-13 DIAGNOSIS — E785 Hyperlipidemia, unspecified: Secondary | ICD-10-CM | POA: Diagnosis not present

## 2015-07-13 DIAGNOSIS — N183 Chronic kidney disease, stage 3 (moderate): Secondary | ICD-10-CM | POA: Insufficient documentation

## 2015-07-13 DIAGNOSIS — R011 Cardiac murmur, unspecified: Secondary | ICD-10-CM | POA: Insufficient documentation

## 2015-07-13 DIAGNOSIS — I509 Heart failure, unspecified: Secondary | ICD-10-CM | POA: Diagnosis not present

## 2015-07-13 LAB — CBC
HEMATOCRIT: 39.3 % (ref 39.0–52.0)
Hemoglobin: 12.1 g/dL — ABNORMAL LOW (ref 13.0–17.0)
MCH: 26.2 pg (ref 26.0–34.0)
MCHC: 30.8 g/dL (ref 30.0–36.0)
MCV: 85.1 fL (ref 78.0–100.0)
PLATELETS: 269 10*3/uL (ref 150–400)
RBC: 4.62 MIL/uL (ref 4.22–5.81)
RDW: 16.1 % — AB (ref 11.5–15.5)
WBC: 8.6 10*3/uL (ref 4.0–10.5)

## 2015-07-13 LAB — URINALYSIS, ROUTINE W REFLEX MICROSCOPIC
Bilirubin Urine: NEGATIVE
Glucose, UA: >1000 mg/dL — AB
Hgb urine dipstick: NEGATIVE
KETONES UR: NEGATIVE mg/dL
LEUKOCYTES UA: NEGATIVE
NITRITE: NEGATIVE
PROTEIN: NEGATIVE mg/dL
Specific Gravity, Urine: 1.024 (ref 1.005–1.030)
pH: 6 (ref 5.0–8.0)

## 2015-07-13 LAB — URINE MICROSCOPIC-ADD ON: WBC, UA: NONE SEEN WBC/hpf (ref 0–5)

## 2015-07-13 LAB — BASIC METABOLIC PANEL
ANION GAP: 8 (ref 5–15)
BUN: 43 mg/dL — AB (ref 6–20)
CO2: 29 mmol/L (ref 22–32)
CREATININE: 1.9 mg/dL — AB (ref 0.61–1.24)
Calcium: 8.8 mg/dL — ABNORMAL LOW (ref 8.9–10.3)
Chloride: 99 mmol/L — ABNORMAL LOW (ref 101–111)
GFR, EST AFRICAN AMERICAN: 37 mL/min — AB (ref >60)
GFR, EST NON AFRICAN AMERICAN: 32 mL/min — AB (ref >60)
GLUCOSE: 348 mg/dL — AB (ref 65–99)
POTASSIUM: 4.5 mmol/L (ref 3.5–5.1)
Sodium: 136 mmol/L (ref 135–145)

## 2015-07-13 LAB — CBG MONITORING, ED
GLUCOSE-CAPILLARY: 312 mg/dL — AB (ref 65–99)
GLUCOSE-CAPILLARY: 213 mg/dL — AB (ref 65–99)

## 2015-07-13 NOTE — Discharge Instructions (Signed)
As discussed, your evaluation today has been largely reassuring.  Abnormal blood glucose readings are typical after you have been taking steroids for any duration.  However, if you develop new, or concerning changes in your condition, please be sure to return here for further evaluation.  Otherwise, please follow-up with your physician for appropriate ongoing care.  Hyperglycemia Hyperglycemia occurs when the glucose (sugar) in your blood is too high. Hyperglycemia can happen for many reasons, but it most often happens to people who do not know they have diabetes or are not managing their diabetes properly.  CAUSES  Whether you have diabetes or not, there are other causes of hyperglycemia. Hyperglycemia can occur when you have diabetes, but it can also occur in other situations that you might not be as aware of, such as: Diabetes  If you have diabetes and are having problems controlling your blood glucose, hyperglycemia could occur because of some of the following reasons:  Not following your meal plan.  Not taking your diabetes medications or not taking it properly.  Exercising less or doing less activity than you normally do.  Being sick. Pre-diabetes  This cannot be ignored. Before people develop Type 2 diabetes, they almost always have "pre-diabetes." This is when your blood glucose levels are higher than normal, but not yet high enough to be diagnosed as diabetes. Research has shown that some long-term damage to the body, especially the heart and circulatory system, may already be occurring during pre-diabetes. If you take action to manage your blood glucose when you have pre-diabetes, you may delay or prevent Type 2 diabetes from developing. Stress  If you have diabetes, you may be "diet" controlled or on oral medications or insulin to control your diabetes. However, you may find that your blood glucose is higher than usual in the hospital whether you have diabetes or not. This is  often referred to as "stress hyperglycemia." Stress can elevate your blood glucose. This happens because of hormones put out by the body during times of stress. If stress has been the cause of your high blood glucose, it can be followed regularly by your caregiver. That way he/she can make sure your hyperglycemia does not continue to get worse or progress to diabetes. Steroids  Steroids are medications that act on the infection fighting system (immune system) to block inflammation or infection. One side effect can be a rise in blood glucose. Most people can produce enough extra insulin to allow for this rise, but for those who cannot, steroids make blood glucose levels go even higher. It is not unusual for steroid treatments to "uncover" diabetes that is developing. It is not always possible to determine if the hyperglycemia will go away after the steroids are stopped. A special blood test called an A1c is sometimes done to determine if your blood glucose was elevated before the steroids were started. SYMPTOMS  Thirsty.  Frequent urination.  Dry mouth.  Blurred vision.  Tired or fatigue.  Weakness.  Sleepy.  Tingling in feet or leg. DIAGNOSIS  Diagnosis is made by monitoring blood glucose in one or all of the following ways:  A1c test. This is a chemical found in your blood.  Fingerstick blood glucose monitoring.  Laboratory results. TREATMENT  First, knowing the cause of the hyperglycemia is important before the hyperglycemia can be treated. Treatment may include, but is not be limited to:  Education.  Change or adjustment in medications.  Change or adjustment in meal plan.  Treatment for an illness,  infection, etc.  More frequent blood glucose monitoring.  Change in exercise plan.  Decreasing or stopping steroids.  Lifestyle changes. HOME CARE INSTRUCTIONS   Test your blood glucose as directed.  Exercise regularly. Your caregiver will give you instructions about  exercise. Pre-diabetes or diabetes which comes on with stress is helped by exercising.  Eat wholesome, balanced meals. Eat often and at regular, fixed times. Your caregiver or nutritionist will give you a meal plan to guide your sugar intake.  Being at an ideal weight is important. If needed, losing as little as 10 to 15 pounds may help improve blood glucose levels. SEEK MEDICAL CARE IF:   You have questions about medicine, activity, or diet.  You continue to have symptoms (problems such as increased thirst, urination, or weight gain). SEEK IMMEDIATE MEDICAL CARE IF:   You are vomiting or have diarrhea.  Your breath smells fruity.  You are breathing faster or slower.  You are very sleepy or incoherent.  You have numbness, tingling, or pain in your feet or hands.  You have chest pain.  Your symptoms get worse even though you have been following your caregiver's orders.  If you have any other questions or concerns.   This information is not intended to replace advice given to you by your health care provider. Make sure you discuss any questions you have with your health care provider.   Document Released: 01/11/2001 Document Revised: 10/10/2011 Document Reviewed: 03/24/2015 Elsevier Interactive Patient Education [employer].

## 2015-07-13 NOTE — ED Provider Notes (Signed)
CSN: [id number]     Arrival date & time 07/13/15  1426 History   First MD Initiated Contact with Patient 07/13/15 1913     Chief Complaint  Patient presents with  . Hyperglycemia    HPI  Patient presents with concern of hyperglycemia. He himself states that he feels great, denies other complaints. He is here with his daughter, who assists with the history of present illness. The patient was discharged from here about 2 weeks ago, after an episode of pneumonia. He completed a course of steroids, last dose was 3 days ago. He notes over the past few days he has had blood glucose consistently elevated, greater than 600. Today, after having a similar value on his glucometer, he presents for evaluation. No other recent medication changes, diet changes, activity changes.  Past Medical History  Diagnosis Date  . GERD (gastroesophageal reflux disease)   . Coronary artery disease     History of remote anterior MI with PCI to LAD in 2006; most recent cath 2007, no intervention required  . Chronic systolic dysfunction of left ventricle     EF 30-35%, CLASS II - III SYMPTOMS; intolerant to Coreg  . Hyperlipidemia   . PVC's (premature ventricular contractions)   . BPH (benign prostatic hyperplasia)   . CHF (congestive heart failure) ([place])   . COPD (chronic obstructive pulmonary disease) ([place])   . Heart murmur     hx  . Pneumonia "several times"    aspiration pna with at least 3 admits for this 2016.   . Type II diabetes mellitus ([place])   . CKD (chronic kidney disease) stage 3, GFR 30-59 ml/min 06/08/2012  . Skin cancer "several"    "forearms; head"  . Memory loss   . Anginal pain ([place])   . Dysrhythmia   . Myocardial infarction ([place]) 1994    ANTERIOR  . AICD (automatic cardioverter/defibrillator) present 03/30/2011    Analyze ST study patient  . DOE (dyspnea on exertion)     with heavy exertion  . Achalasia   . Renal failure   . Esophageal dysmotility    Past Surgical History   Procedure Laterality Date  . Foot fracture surgery Right 1980's  . Cholecystectomy  2/11  . Cataract extraction w/ intraocular lens  implant, bilateral  08/2014-09/2014  . Cardiac catheterization  01/11/2006    DEMONSTRATES AKINESIA OF THE DISTAL ANTERIOR WALL, DISTAL INFERIOR WALL AND AKINESIA OF THE APEX. THE BASAL SEGMENTS CONTRACT WELL AND OVERALL EF 35%  . Coronary angioplasty  1994    TO THE LAD  . Skin cancer excision  "several"    "forearms, head"  . Esophageal manometry N/A 03/16/2015    Procedure: ESOPHAGEAL MANOMETRY (EM);  Surgeon: Jerene Bears, MD;  [facility];  Service: Gastroenterology;  Laterality: N/A;  . Insert / replace / remove pacemaker    . Esophagogastroduodenoscopy N/A 04/08/2015    Procedure: ESOPHAGOGASTRODUODENOSCOPY (EGD);  Surgeon: Milus Banister, MD;  Location: [facility];  Service: Endoscopy;  Laterality: N/A;  . Botox injection N/A 04/08/2015    Procedure: BOTOX INJECTION;  Surgeon: Milus Banister, MD;  Location: [place];  Service: Endoscopy;  Laterality: N/A;   Family History  Problem Relation Age of Onset  . Stroke Father     Mother history unknown - never met her  . Colon cancer Neg Hx    Social History  Substance Use Topics  . Smoking status: Former Smoker -- 1.00 packs/day for 35 years  Types: Cigarettes    Quit date: 08/01/1992  . Smokeless tobacco: Never Used  . Alcohol Use: 0.0 oz/week    0 Standard drinks or equivalent per week     Comment: Approximately a beer every 2 years    Review of Systems  Constitutional:       Per HPI, otherwise negative  HENT:       Per HPI, otherwise negative  Respiratory:       Per HPI, otherwise negative  Cardiovascular:       Per HPI, otherwise negative  Gastrointestinal: Negative for vomiting.  Endocrine:       Negative aside from HPI  Genitourinary:       Neg aside from HPI   Musculoskeletal:       Per HPI, otherwise negative  Skin: Negative.   Neurological: Negative for  syncope.      Allergies  Codeine; Dilaudid; Flomax; Morphine and related; Sulfa antibiotics; Beta adrenergic blockers; and Carvedilol  Home Medications   Prior to Admission medications   Medication Sig Start Date End Date Taking? Authorizing Provider  aspirin 81 MG EC tablet Take 81 mg by mouth daily.     Yes Historical Provider, MD  budesonide-formoterol (SYMBICORT) 160-4.5 MCG/ACT inhaler Inhale 2 puffs into the lungs 2 (two) times daily. 09/19/14  Yes Juanito Doom, MD  Cholecalciferol (VITAMIN D PO) Take 2,000 Units by mouth daily.    Yes Historical Provider, MD  donepezil (ARICEPT) 10 MG tablet Take 10 mg by mouth every morning.   Yes Historical Provider, MD  fenofibrate (TRICOR) 145 MG tablet TAKE 1 TABLET BY MOUTH EVERY DAY 01/22/15  Yes Peter M Martinique, MD  furosemide (LASIX) 20 MG tablet Take 2 tablets (40 mg total) by mouth daily. 07/02/15  Yes Barton Dubois, MD  insulin glargine (LANTUS) 100 UNIT/ML injection Inject 0.1 mLs (10 Units total) into the skin every morning. 05/15/15  Yes Asiyah Cletis Media, MD  loratadine (CLARITIN) 10 MG tablet Take 1 tablet (10 mg total) by mouth daily. 07/02/15  Yes Barton Dubois, MD  memantine (NAMENDA) 10 MG tablet Take 10 mg by mouth 2 (two) times daily.   Yes Historical Provider, MD  Multiple Vitamins-Minerals (PRESERVISION AREDS 2) CAPS Take 1 capsule by mouth 2 (two) times daily.   Yes Historical Provider, MD  nitroGLYCERIN (NITRODUR - DOSED IN MG/24 HR) 0.2 mg/hr patch Place 1 patch (0.2 mg total) onto the skin daily. 05/08/15  Yes Thompson Grayer, MD  pravastatin (PRAVACHOL) 40 MG tablet TAKE 1 TABLET(40 MG) BY MOUTH EVERY EVENING 07/02/15  Yes Peter M Martinique, MD  PROVENTIL HFA 108 (90 BASE) MCG/ACT inhaler Inhale 2 puffs into the lungs every 6 (six) hours as needed for wheezing or shortness of breath. As directed 05/24/13  Yes Historical Provider, MD  SPIRIVA HANDIHALER 18 MCG inhalation capsule INHALE CONTENTS OF 1 CAPSULE VIA HANDIHALER ONCE  DAILY 06/26/15  Yes Juanito Doom, MD  pantoprazole (PROTONIX) 20 MG tablet TAKE 1 TABLET BY MOUTH EVERY DAY Patient not taking: Reported on 07/13/2015 07/18/14   Inda Castle, MD   BP 126/73 mmHg  Pulse 66  Temp(Src) 98.6 F (37 C) (Oral)  Resp 18  SpO2 99% Physical Exam  Constitutional: He is oriented to person, place, and time. He appears well-developed. No distress.  HENT:  Head: Normocephalic and atraumatic.  Eyes: Conjunctivae and EOM are normal.  Cardiovascular: Normal rate and regular rhythm.   Pulmonary/Chest: Effort normal. No stridor. No respiratory distress.  Abdominal:  He exhibits no distension.  Musculoskeletal: He exhibits no edema.  Neurological: He is alert and oriented to person, place, and time. No cranial nerve deficit. He exhibits normal muscle tone. Coordination normal.  Skin: Skin is warm and dry.  Psychiatric: He has a normal mood and affect.  Nursing note and vitals reviewed.   ED Course  Procedures (including critical care time) Labs Review Labs Reviewed  BASIC METABOLIC PANEL - Abnormal; Notable for the following:    Chloride 99 (*)    Glucose, Bld 348 (*)    BUN 43 (*)    Creatinine, Ser 1.90 (*)    Calcium 8.8 (*)    GFR calc non Af Amer 32 (*)    GFR calc Af Amer 37 (*)    All other components within normal limits  CBC - Abnormal; Notable for the following:    Hemoglobin 12.1 (*)    RDW 16.1 (*)    All other components within normal limits  URINALYSIS, ROUTINE W REFLEX MICROSCOPIC (NOT AT [facility]) - Abnormal; Notable for the following:    Glucose, UA >1000 (*)    All other components within normal limits  URINE MICROSCOPIC-ADD ON - Abnormal; Notable for the following:    Squamous Epithelial / LPF 0-5 (*)    Bacteria, UA RARE (*)    All other components within normal limits  CBG MONITORING, ED - Abnormal; Notable for the following:    Glucose-Capillary 312 (*)    All other components within normal limits  CBG MONITORING, ED -  Abnormal; Notable for the following:    Glucose-Capillary 213 (*)    All other components within normal limits    Imaging Review Dg Chest 2 View  07/13/2015  CLINICAL DATA:  Pt states his blood sugar was >600 this morning. Pt gave himself 10 units of insulin at 0630 this morning. Recent hx of pneumonia but pt states the symptoms have almost completely cleared up. EXAM: CHEST  2 VIEW COMPARISON:  06/29/2015 FINDINGS: Mild hyperinflation. Pacer with leads at right atrium and right ventricle. No lead discontinuity. Midline trachea. Borderline cardiomegaly with transverse aortic atherosclerosis. No pleural effusion or pneumothorax. Biapical pleural thickening. Clear lungs. IMPRESSION: No acute cardiopulmonary disease. Hyperinflation with aortic atherosclerosis. Electronically Signed   By: Abigail Miyamoto M.D.   On: 07/13/2015 20:29   I have personally reviewed and evaluated these images and lab results as part of my medical decision-making.  9:20 PM On repeat exam the patient is in no distress Discussed all findings, x-ray, lab results with him and his daughter. We discussed the need for outpatient follow-up, and likelihood ofpersistently increased glucose values, given his recent course of steroids.    MDM   Final diagnoses:  Hyperglycemia   elderly male with recent hospitalization presents with concern of persistent hyperglycemia. Here, no evidence for DKA, acidosis or decompensated state. No evidence for ongoing infection. Patient has been tolerating his outpatient therapy well, and sugar may be secondary to recent course of steroids. Patient discharged in stable condition.   Carmin Muskrat, MD 07/13/15 2121

## 2015-07-13 NOTE — ED Notes (Signed)
Pt states his blood sugar was >600 this morning. Pt gave himself 10 units of insulin at 0630 this morning. Pt denies any others symptoms.

## 2015-07-15 DIAGNOSIS — Z794 Long term (current) use of insulin: Secondary | ICD-10-CM | POA: Diagnosis not present

## 2015-07-15 DIAGNOSIS — E1142 Type 2 diabetes mellitus with diabetic polyneuropathy: Secondary | ICD-10-CM | POA: Diagnosis not present

## 2015-07-16 ENCOUNTER — Ambulatory Visit (INDEPENDENT_AMBULATORY_CARE_PROVIDER_SITE_OTHER): Payer: Medicare Other | Admitting: Pulmonary Disease

## 2015-07-16 ENCOUNTER — Encounter: Payer: Self-pay | Admitting: Pulmonary Disease

## 2015-07-16 VITALS — BP 124/66 | HR 67 | Ht 70.0 in | Wt 190.0 lb

## 2015-07-16 DIAGNOSIS — J449 Chronic obstructive pulmonary disease, unspecified: Secondary | ICD-10-CM | POA: Diagnosis not present

## 2015-07-16 DIAGNOSIS — J189 Pneumonia, unspecified organism: Secondary | ICD-10-CM | POA: Diagnosis not present

## 2015-07-16 DIAGNOSIS — I255 Ischemic cardiomyopathy: Secondary | ICD-10-CM

## 2015-07-16 NOTE — Assessment & Plan Note (Signed)
He has severe COPD but is doing remarkably well despite his multiple recent hospital visits. He has been gradually increasing his activity which is a good thing.  Plan: Continue medications as prescribed Continue to push regular activity  Greater than 50% of visit was spent face-to-face answering questions regarding his aspiration pneumonia as part of a 25 minute visit

## 2015-07-16 NOTE — Progress Notes (Signed)
Subjective:    Patient ID: Antonio Ray, male    DOB: 11-27-1933, 81 y.o.   MRN: [id number]   synopsis: First the [place] pulmonary clinic in 2015 for severe COPD. His FEV1 at that time was 29% predicted. He also has a scar in his right lung base believed to be related to pneumonia.  HPI  Chief Complaint  Patient presents with  . Follow-up    pt states he is doing well today, no complaints.      Antonio Ray says that he feels great.  He is walking more now thatn before.  He is walking 1/2 mile every day, most days he does this twice. He was hospitalized twice again since his ICU stay for pneumonia (presumed aspiration).  He used oxygen briefly at home.  He underwent Botox injection of his distal esophagus in his September hospital visit.   He has slowly weaned himself of fof oxygen and has not sued it in several weeks now at this point. His strength is slowly coming back.   Past Medical History  Diagnosis Date  . GERD (gastroesophageal reflux disease)   . Coronary artery disease     History of remote anterior MI with PCI to LAD in 2006; most recent cath 2007, no intervention required  . Chronic systolic dysfunction of left ventricle     EF 30-35%, CLASS II - III SYMPTOMS; intolerant to Coreg  . Hyperlipidemia   . PVC's (premature ventricular contractions)   . BPH (benign prostatic hyperplasia)   . CHF (congestive heart failure) ([place])   . COPD (chronic obstructive pulmonary disease) ([place])   . Heart murmur     hx  . Pneumonia "several times"    aspiration pna with at least 3 admits for this 2016.   . Type II diabetes mellitus ([place])   . CKD (chronic kidney disease) stage 3, GFR 30-59 ml/min 06/08/2012  . Skin cancer "several"    "forearms; head"  . Memory loss   . Anginal pain ([place])   . Dysrhythmia   . Myocardial infarction ([place]) 1994    ANTERIOR  . AICD (automatic cardioverter/defibrillator) present 03/30/2011    Analyze ST study patient  . DOE (dyspnea on exertion)    with heavy exertion  . Achalasia   . Renal failure   . Esophageal dysmotility      Review of Systems     Objective:   Physical Exam  Filed Vitals:   07/16/15 1446  BP: 124/66  Pulse: 67  Height: 5\' 10"  (1.778 m)  Weight: 190 lb (86.183 kg)  SpO2: 97%  Ra  Gen: well appearing, no acute distress HEENT: NCAT, EOMi, OP clear PULM: Few wheezes and crackles in R lung base, otherwise clear CV: RRR, no mgr, no JVD AB: BS+, soft, nontender, no hsm Ext: warm, no edema, no clubbing, no cyanosis Derm: no rash or skin breakdown       Assessment & Plan:   HCAP (healthcare-associated pneumonia) and recurrent Aspiration Pneumonia I have reviewed the records from his multiple hospital visits over the last several months as well as multiple chest x-rays. I believe that his recurrent episodes of pneumonia are related to his achalasia. However, I do question whether or not the most recent episode which occurred with a relatively normal chest x-ray was in fact a COPD exacerbation.  Plan: I agree with the plan for treatment of his achalasia. If admitted to the hospital again I would recommend the pulmonary critical care medicine be consulted.  COPD GOLD IV He has severe COPD but is doing remarkably well despite his multiple recent hospital visits. He has been gradually increasing his activity which is a good thing.  Plan: Continue medications as prescribed Continue to push regular activity  Greater than 50% of visit was spent face-to-face answering questions regarding his aspiration pneumonia as part of a 25 minute visit    Updated Medication List Outpatient Encounter Prescriptions as of 07/16/2015  Medication Sig  . aspirin 81 MG EC tablet Take 81 mg by mouth daily.    . budesonide-formoterol (SYMBICORT) 160-4.5 MCG/ACT inhaler Inhale 2 puffs into the lungs 2 (two) times daily.  . Cholecalciferol (VITAMIN D PO) Take 2,000 Units by mouth daily.   Marland Kitchen donepezil (ARICEPT) 10 MG  tablet Take 10 mg by mouth every morning.  . fenofibrate (TRICOR) 145 MG tablet TAKE 1 TABLET BY MOUTH EVERY DAY  . furosemide (LASIX) 20 MG tablet Take 2 tablets (40 mg total) by mouth daily.  . insulin glargine (LANTUS) 100 UNIT/ML injection Inject 0.1 mLs (10 Units total) into the skin every morning.  . loratadine (CLARITIN) 10 MG tablet Take 1 tablet (10 mg total) by mouth daily.  . memantine (NAMENDA) 10 MG tablet Take 10 mg by mouth 2 (two) times daily.  . Multiple Vitamins-Minerals (PRESERVISION AREDS 2) CAPS Take 1 capsule by mouth 2 (two) times daily.  . nitroGLYCERIN (NITRODUR - DOSED IN MG/24 HR) 0.2 mg/hr patch Place 1 patch (0.2 mg total) onto the skin daily.  . pantoprazole (PROTONIX) 20 MG tablet TAKE 1 TABLET BY MOUTH EVERY DAY  . pravastatin (PRAVACHOL) 40 MG tablet TAKE 1 TABLET(40 MG) BY MOUTH EVERY EVENING  . PROVENTIL HFA 108 (90 BASE) MCG/ACT inhaler Inhale 2 puffs into the lungs every 6 (six) hours as needed for wheezing or shortness of breath. As directed  . SPIRIVA HANDIHALER 18 MCG inhalation capsule INHALE CONTENTS OF 1 CAPSULE VIA HANDIHALER ONCE DAILY   No facility-administered encounter medications on file as of 07/16/2015.

## 2015-07-16 NOTE — Assessment & Plan Note (Addendum)
I have reviewed the records from his multiple hospital visits over the last several months as well as multiple chest x-rays. I believe that his recurrent episodes of pneumonia are related to his achalasia. However, I do question whether or not the most recent episode which occurred with a relatively normal chest x-ray was in fact a COPD exacerbation.  Plan: I agree with the plan for treatment of his achalasia. If admitted to the hospital again I would recommend the pulmonary critical care medicine be consulted.

## 2015-07-16 NOTE — Patient Instructions (Signed)
I recommend that you continue with plans to have the endoscopy and your esophageal dilatation Keep taking your inhaled medicines as ordered If you are hospitalized again please request that the pulmonary medicine service be consulted We will see you back in 3-4 months or sooner if needed

## 2015-07-30 ENCOUNTER — Ambulatory Visit (INDEPENDENT_AMBULATORY_CARE_PROVIDER_SITE_OTHER): Payer: Medicare Other | Admitting: Gastroenterology

## 2015-07-30 ENCOUNTER — Encounter: Payer: Self-pay | Admitting: Gastroenterology

## 2015-07-30 VITALS — BP 114/68 | HR 60 | Ht 68.0 in | Wt 190.1 lb

## 2015-07-30 DIAGNOSIS — R1310 Dysphagia, unspecified: Secondary | ICD-10-CM | POA: Diagnosis not present

## 2015-07-30 DIAGNOSIS — I255 Ischemic cardiomyopathy: Secondary | ICD-10-CM | POA: Diagnosis not present

## 2015-07-30 DIAGNOSIS — K220 Achalasia of cardia: Secondary | ICD-10-CM

## 2015-07-30 NOTE — Patient Instructions (Signed)
You have been scheduled for your Endoscopy at [facility] on 09/09/2015 at 10:45am you will need to arrive at 9:15am You will have a barium swallow with gastrograffin completed in radiology after your procedure at 1pm. Continue to be nothing to eat or drink after your procedure until after you have your radiology test

## 2015-07-30 NOTE — Progress Notes (Signed)
Antonio Ray    [id number]    1934-07-30  Primary Care Physician:BRISCOE, Maudie Mercury, MD  Referring Physician: [name], MD [place] [place], [place] [address]  Chief complaint:   achalasia HPI: Antonio Ray  Is here for follow-up of his achalasia. He has a history of coronary disease with remote myocardial infarction in 1994. He had emergent angioplasty of the LAD. Cardiac catheterization 2007 showed nonobstructive disease He has an ICD in place. Most recent Echo in September 2016 showed EF had dropped to 10-15% with global hypokinesis and akinesis of the anteroseptum.  He also  Has history of  Severe COPD with  FEV1 29% predicted.  He was admitted 6 times last year with aspiration pneumonia and COPD exacerbation.  He was diagnosed with achalasia in August 2016,  Had Botox injections at [facility] in September 2016,  Since Botox he feels his swallowing has improved and no longer choking or regurgitating.  He is here to discuss potential options for management of achalasia and is interested in undergoing pneumatic balloon dilation.  He is very active at baseline and currently he walks about half a mile to a mile a day and is not using oxygen at home.    Outpatient Encounter Prescriptions as of 07/30/2015  Medication Sig  . aspirin 81 MG EC tablet Take 81 mg by mouth daily.    . budesonide-formoterol (SYMBICORT) 160-4.5 MCG/ACT inhaler Inhale 2 puffs into the lungs 2 (two) times daily.  . Cholecalciferol (VITAMIN D PO) Take 2,000 Units by mouth daily.   Marland Kitchen donepezil (ARICEPT) 10 MG tablet Take 10 mg by mouth every morning.  . fenofibrate (TRICOR) 145 MG tablet TAKE 1 TABLET BY MOUTH EVERY DAY  . furosemide (LASIX) 20 MG tablet Take 2 tablets (40 mg total) by mouth daily.  . insulin glargine (LANTUS) 100 UNIT/ML injection Inject 0.1 mLs (10 Units total) into the skin every morning.  . loratadine (CLARITIN) 10 MG tablet Take 1 tablet (10 mg total) by mouth  daily.  . memantine (NAMENDA) 10 MG tablet Take 10 mg by mouth 2 (two) times daily.  . Multiple Vitamins-Minerals (PRESERVISION AREDS 2) CAPS Take 1 capsule by mouth 2 (two) times daily.  . nitroGLYCERIN (NITRODUR - DOSED IN MG/24 HR) 0.2 mg/hr patch Place 1 patch (0.2 mg total) onto the skin daily.  . pantoprazole (PROTONIX) 20 MG tablet TAKE 1 TABLET BY MOUTH EVERY DAY  . pravastatin (PRAVACHOL) 40 MG tablet TAKE 1 TABLET(40 MG) BY MOUTH EVERY EVENING  . PROVENTIL HFA 108 (90 BASE) MCG/ACT inhaler Inhale 2 puffs into the lungs every 6 (six) hours as needed for wheezing or shortness of breath. As directed  . SPIRIVA HANDIHALER 18 MCG inhalation capsule INHALE CONTENTS OF 1 CAPSULE VIA HANDIHALER ONCE DAILY   No facility-administered encounter medications on file as of 07/30/2015.    Allergies as of 07/30/2015 - Review Complete 07/16/2015  Allergen Reaction Noted  . Codeine Other (See Comments)   . Dilaudid [hydromorphone hcl] Other (See Comments) 09/10/2013  . Flomax [tamsulosin hcl] Shortness Of Breath 05/19/2012  . Morphine and related Other (See Comments) 05/20/2011  . Sulfa antibiotics Shortness Of Breath 05/23/2012  . Beta adrenergic blockers Other (See Comments) 03/31/2015  . Carvedilol Other (See Comments) 04/18/2011    Past Medical History  Diagnosis Date  . GERD (gastroesophageal reflux disease)   . Coronary artery disease     History of remote  anterior MI with PCI to LAD in 2006; most recent cath 2007, no intervention required  . Chronic systolic dysfunction of left ventricle     EF 30-35%, CLASS II - III SYMPTOMS; intolerant to Coreg  . Hyperlipidemia   . PVC's (premature ventricular contractions)   . BPH (benign prostatic hyperplasia)   . CHF (congestive heart failure) ([place])   . COPD (chronic obstructive pulmonary disease) ([place])   . Heart murmur     hx  . Pneumonia "several times"    aspiration pna with at least 3 admits for this 2016.   . Type II diabetes mellitus  ([place])   . CKD (chronic kidney disease) stage 3, GFR 30-59 ml/min 06/08/2012  . Skin cancer "several"    "forearms; head"  . Memory loss   . Anginal pain ([place])   . Dysrhythmia   . Myocardial infarction () 1994    ANTERIOR  . AICD (automatic cardioverter/defibrillator) present 03/30/2011    Analyze ST study patient  . DOE (dyspnea on exertion)     with heavy exertion  . Achalasia   . Renal failure   . Esophageal dysmotility     Past Surgical History  Procedure Laterality Date  . Foot fracture surgery Right 1980's  . Cholecystectomy  2/11  . Cataract extraction w/ intraocular lens  implant, bilateral  08/2014-09/2014  . Cardiac catheterization  01/11/2006    DEMONSTRATES AKINESIA OF THE DISTAL ANTERIOR WALL, DISTAL INFERIOR WALL AND AKINESIA OF THE APEX. THE BASAL SEGMENTS CONTRACT WELL AND OVERALL EF 35%  . Coronary angioplasty  1994    TO THE LAD  . Skin cancer excision  "several"    "forearms, head"  . Esophageal manometry N/A 03/16/2015    Procedure: ESOPHAGEAL MANOMETRY (EM);  Surgeon: Jerene Bears, MD;  Location: [facility];  Service: Gastroenterology;  Laterality: N/A;  . Insert / replace / remove pacemaker    . Esophagogastroduodenoscopy N/A 04/08/2015    Procedure: ESOPHAGOGASTRODUODENOSCOPY (EGD);  Surgeon: Milus Banister, MD;  Location: [facility];  Service: Endoscopy;  Laterality: N/A;  . Botox injection N/A 04/08/2015    Procedure: BOTOX INJECTION;  Surgeon: Milus Banister, MD;  Location: [facility];  Service: Endoscopy;  Laterality: N/A;    Family History  Problem Relation Age of Onset  . Stroke Father     Mother history unknown - never met her  . Colon cancer Neg Hx     Social History   Social History  . Marital Status: Widowed    Spouse Name: N/A  . Number of Children: 2  . Years of Education: GED   Occupational History  . 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 Smoking  status: Former Smoker -- 1.00 packs/day for 35 years    Types: Cigarettes    Quit date: 08/01/1992  . Smokeless tobacco: Never Used  . Alcohol Use: 0.0 oz/week    0 Standard drinks or equivalent per week     Comment: Approximately a beer every 2 years  . Drug Use: No  . Sexual Activity: Not Currently   Other Topics Concern  . Not on file   Social History Narrative   Pt lives in [place] alone.  Widowed.   Right-handed.   Rare caffeine use - maybe one cup per month.      Review of systems: Review of Systems  Constitutional: Negative for fever  and chills.  HENT: Negative.   Eyes: Negative for blurred vision.  Respiratory: Negative for cough, shortness of breath and wheezing.   Cardiovascular: Negative for chest pain and palpitations.  Gastrointestinal: as per HPI Genitourinary: Negative for dysuria, urgency, frequency and hematuria.  Musculoskeletal: Negative for myalgias, back pain and joint pain.  Skin: Negative for itching and rash.  Neurological: Negative for dizziness, tremors, focal weakness, seizures and loss of consciousness.  Endo/Heme/Allergies: Negative for environmental allergies.  Psychiatric/Behavioral: Negative for depression, suicidal ideas and hallucinations.  All other systems reviewed and are negative.   Physical Exam: Filed Vitals:   07/30/15 1528  BP: 114/68  Pulse: 60   Gen:      No acute distress HEENT:  EOMI, sclera anicteric Neck:     No masses; no thyromegaly Lungs:    Clear to auscultation bilaterally; normal respiratory effort CV:         Regular rate and rhythm; no murmurs Abd:      + bowel sounds; soft, non-tender; no palpable masses, no distension Ext:    No edema; adequate peripheral perfusion Skin:      Warm and dry; no rash Neuro: alert and oriented x 3 Psych: normal mood and affect  Data Reviewed: EGD 04/08/2015 The GE junction was smoothly narrowed. The mucosa in this region was normal. I was able to advance the  adult gastroscope across the GE junction with only mild pressure. The UGI tract was otherwise normal appearing. Using a standard sclerotherapy needed, I injected Botulinum Toxin 1cm above the GE junction (25units each into four quadrants)  Barium Swallow 02/10/2015 1. Significant esophageal dysmotility with diffuse tertiary contractions and a decreased primary stripping wave. There is narrowing of the distal esophagus with limited relaxation of the lower esophageal sphincter and non passage of a barium tablet. Findings are suspicious for achalasia. 2. No mucosal ulceration or recurrent aspiration identified . 3. Endoscopic evaluation recommended.  Esophageal Manometry 03/2015 100% failed with pan esophageal pressurization with elevated IRP  incomplete bolus clearance  Assessment and Plan/Recommendations:  81 year old male with history of severe COPD , CAD and severe CHF with achalasia type II here for follow-up visit  He had good response to Botox injections at GE junction  He is interested in going ahead with pneumatic balloon dilation  I discussed the potential side effects and also the benefits of the procedure,  both patient and his son are agreeable to proceed with it  advised patient to avoid meals or drinking any excessive amount of fluids 2-3 hours before bedtime  sleep with head end elevation  We'll schedule for EGD with pneumatic balloon dilation  K. Denzil Magnuson , MD [phone] Mon-Fri 8a-5p [phone] after 5p, weekends, holidays

## 2015-08-03 ENCOUNTER — Other Ambulatory Visit: Payer: Self-pay | Admitting: Pulmonary Disease

## 2015-08-18 ENCOUNTER — Other Ambulatory Visit: Payer: Self-pay | Admitting: Gastroenterology

## 2015-08-18 ENCOUNTER — Other Ambulatory Visit: Payer: Self-pay | Admitting: Internal Medicine

## 2015-08-18 ENCOUNTER — Encounter: Payer: Self-pay | Admitting: Neurology

## 2015-08-18 ENCOUNTER — Ambulatory Visit (INDEPENDENT_AMBULATORY_CARE_PROVIDER_SITE_OTHER): Payer: Medicare Other | Admitting: Neurology

## 2015-08-18 VITALS — BP 122/68 | HR 66 | Ht 68.0 in | Wt 192.0 lb

## 2015-08-18 DIAGNOSIS — G3184 Mild cognitive impairment, so stated: Secondary | ICD-10-CM | POA: Insufficient documentation

## 2015-08-18 MED ORDER — MEMANTINE HCL 10 MG PO TABS: 10.0000 mg | ORAL_TABLET | Freq: Two times a day (BID) | ORAL | Status: DC

## 2015-08-18 MED ORDER — DONEPEZIL HCL 10 MG PO TABS: 10.0000 mg | ORAL_TABLET | Freq: Every morning | ORAL | Status: DC

## 2015-08-18 NOTE — Progress Notes (Signed)
Chief Complaint  Patient presents with  . Memory Loss    MMSE 28/30 - 16 animals. He is here with his son, Antonio Ray.  Feels his short-term memory has continued to decline.      HISTORICAL  Antonio Ray is a 82 years old right-handed male, seen in referred by his primary care physician Dr. Doreene Ray for memory trouble, he is accompanied by his son Antonio Ray, and daughter Antonio Ray at today's clinical visit.  He had a history of hyperlipidemia,diabetes, pacemaker placement, he used to work as a Copy, just retired in 2015, he lived in the same household for 40 years, in joints. In his car,  Antonio Ray his son, noticed patient has gradual onset memory trouble, especially since his retirement in 2015, He has difficulty remembering his password, misplace things, less confident in driving directions. He still able to drive start distance, he has difficulty to carry on complicated conversations, tends to repeat himself, word finding difficulties  I have reviewed most recent note with Dr. Doreene Ray in Dec 12 2014, per record, Aricept was started since April 2016, mild improvement  CAT scan of the brain without contrast April 2016 showed no acute abnormality  Laboratory in April 2016, normal CBC, CMP showed elevated glucose 256, creatinine 1.65, normal TSH 1.15, B12 567  UPDATE Aug 18 2015: He is accompanied by his son Antonio Ray. He had pneumonia 6 times since last visit in June 2016, he was diagnosed with aspiration pneumonia, also related to his COPD.  He required intubation on one occasions, he also received endoscopy guided botulism toxin injection for his lower esophagus spasm, he can taste better, swallowing better  He still lives alone, active, still drives. His glucose is elevated.  He use insulin for his DM, he is manage his insulin dose without difficulty, his memory seems to improve up his extensive treatment for pneumonia.  REVIEW OF SYSTEMS: Full 14 system review of systems performed and  notable only for memory loss, shortness of breath, cough  ALLERGIES: Allergies  Allergen Reactions  . Codeine Other (See Comments)    Gets very angry, disoriented  . Dilaudid [Hydromorphone Hcl] Other (See Comments)    VERY AGITATED, HOSTILE  . Flomax [Tamsulosin Hcl] Shortness Of Breath  . Morphine And Related Other (See Comments)    VERY AGITATED, HOSTILE  . Sulfa Antibiotics Shortness Of Breath  . Beta Adrenergic Blockers Other (See Comments)    ? disorientation  . Carvedilol Other (See Comments)    DISORIENTATION    HOME MEDICATIONS: Current Outpatient Prescriptions  Medication Sig Dispense Refill  . aspirin 81 MG EC tablet Take 81 mg by mouth daily.      . budesonide-formoterol (SYMBICORT) 160-4.5 MCG/ACT inhaler Inhale 2 puffs into the lungs 2 (two) times daily. 1 Inhaler 12  . Cholecalciferol (VITAMIN D PO) Take 2,000 Units by mouth daily.     Marland Kitchen donepezil (ARICEPT) 10 MG tablet Take 10 mg by mouth every morning.    . fenofibrate (TRICOR) 145 MG tablet TAKE 1 TABLET BY MOUTH EVERY DAY 90 tablet 1  . furosemide (LASIX) 20 MG tablet TAKE 1 TABLET BY MOUTH EVERY DAY 90 tablet 2  . guaiFENesin (MUCINEX) 600 MG 12 hr tablet Take 1 tablet (600 mg total) by mouth 2 (two) times daily as needed for to loosen phlegm (for chest congestion). 30 tablet 0  . insulin glargine (LANTUS) 100 UNIT/ML injection Inject 24 Units into the skin every morning.     . nitroGLYCERIN (NITRODUR - DOSED  IN MG/24 HR) 0.2 mg/hr patch PLACE 1 PATCH ONTO THE SKIN DAILY 90 patch 4  . pantoprazole (PROTONIX) 20 MG tablet TAKE 1 TABLET BY MOUTH EVERY DAY 90 tablet 0  . pravastatin (PRAVACHOL) 40 MG tablet TAKE 1 TABLET BY MOUTH EVERY EVENING 90 tablet 1  . PROVENTIL HFA 108 (90 BASE) MCG/ACT inhaler Inhale 2 puffs into the lungs every 6 (six) hours as needed for wheezing or shortness of breath. As directed    . tiotropium (SPIRIVA) 18 MCG inhalation capsule Place 1 capsule (18 mcg total) into inhaler and inhale  daily. 30 capsule 6     PAST MEDICAL HISTORY: Past Medical History  Diagnosis Date  . GERD (gastroesophageal reflux disease)   . Coronary artery disease     History of remote anterior MI with PCI to LAD in 2006; most recent cath 2007, no intervention required  . Chronic systolic dysfunction of left ventricle     EF 30-35%, CLASS II - III SYMPTOMS; intolerant to Coreg  . Hyperlipidemia   . PVC's (premature ventricular contractions)   . BPH (benign prostatic hyperplasia)   . CHF (congestive heart failure) ([place])   . COPD (chronic obstructive pulmonary disease) ([place])   . Heart murmur     hx  . Pneumonia "several times"    aspiration pna with at least 3 admits for this 2016.   . Type II diabetes mellitus ([place])   . CKD (chronic kidney disease) stage 3, GFR 30-59 ml/min 06/08/2012  . Skin cancer "several"    "forearms; head"  . Memory loss   . Anginal pain ([place])   . Dysrhythmia   . Myocardial infarction ([facility]) 1994    ANTERIOR  . AICD (automatic cardioverter/defibrillator) present 03/30/2011    Analyze ST study patient  . DOE (dyspnea on exertion)     with heavy exertion  . Achalasia   . Renal failure   . Esophageal dysmotility     PAST SURGICAL HISTORY: Past Surgical History  Procedure Laterality Date  . Foot fracture surgery Right 1980's  . Cholecystectomy  2/11  . Cataract extraction w/ intraocular lens  implant, bilateral  08/2014-09/2014  . Cardiac catheterization  01/11/2006    DEMONSTRATES AKINESIA OF THE DISTAL ANTERIOR WALL, DISTAL INFERIOR WALL AND AKINESIA OF THE APEX. THE BASAL SEGMENTS CONTRACT WELL AND OVERALL EF 35%  . Coronary angioplasty  1994    TO THE LAD  . Skin cancer excision  "several"    "forearms, head"  . Esophageal manometry N/A 03/16/2015    Procedure: ESOPHAGEAL MANOMETRY (EM);  Surgeon: Jerene Bears, MD;  Location: [facility];  Service: Gastroenterology;  Laterality: N/A;  . Insert / replace / remove pacemaker    . Esophagogastroduodenoscopy N/A  04/08/2015    Procedure: ESOPHAGOGASTRODUODENOSCOPY (EGD);  Surgeon: Milus Banister, MD;  Location: [facility];  Service: Endoscopy;  Laterality: N/A;  . Botox injection N/A 04/08/2015    Procedure: BOTOX INJECTION;  Surgeon: Milus Banister, MD;  Location: [facility];  Service: Endoscopy;  Laterality: N/A;    FAMILY HISTORY: Family History  Problem Relation Age of Onset  . Stroke Father     Mother history unknown - never met her  . Colon cancer Neg Hx     SOCIAL HISTORY:  Social History   Social History  . Marital Status: Widowed    Spouse Name: N/A  . Number of Children: 2  . Years of Education: GED   Occupational History  . 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 Smoking status: Former Smoker -- 1.00 packs/day for 35 years    Types: Cigarettes    Quit date: 08/01/1992  . Smokeless tobacco: Never Used  . Alcohol Use: 0.0 oz/week    0 Standard drinks or equivalent per week     Comment: Approximately a beer every 2 years  . Drug Use: No  . Sexual Activity: Not Currently   Other Topics Concern  . Not on file   Social History Narrative   Pt lives in [place] alone.  Widowed.   Right-handed.   Rare caffeine use - maybe one cup per month.     PHYSICAL EXAM   Filed Vitals:   08/18/15 1622  BP: 122/68  Pulse: 66  Height: 5' 8"  (1.727 m)  Weight: 192 lb (87.091 kg)    Not recorded      Body mass index is 29.2 kg/(m^2).  PHYSICAL EXAMNIATION:  Gen: NAD, conversant, well nourised, obese, well groomed                     Cardiovascular: Regular rate rhythm, no peripheral edema, warm, nontender. Eyes: Conjunctivae clear without exudates or hemorrhage Neck: Supple, no carotid bruise. Pulmonary: Clear to auscultation bilaterally   NEUROLOGICAL EXAM:  MENTAL STATUS: Speech:    Speech is normal; fluent and spontaneous with normal comprehension.  Cognition: Mini-Mental Status  Examination is 28 out of 30, animal naming is 16     Orientation to time, place and person     Recent and remote memory: He missed one out of 3 recalls     Normal Attention span and concentration     Normal Language, naming, repeating,spontaneous speech, had difficulty writing full sentence     Fund of knowledge     CRANIAL NERVES: CN II: Visual fields are full to confrontation. Fundoscopic exam is normal with sharp discs and no vascular changes. Venous pulsations are present bilaterally. Pupils are 4 mm and briskly reactive to light. Visual acuity is 20/20 bilaterally. CN III, IV, VI: extraocular movement are normal. No ptosis. CN V: Facial sensation is intact to pinprick in all 3 divisions bilaterally. Corneal responses are intact.  CN VII: Face is symmetric with normal eye closure and smile. CN VIII: Hearing is normal to rubbing fingers CN IX, X: Palate elevates symmetrically. Phonation is normal. CN XI: Head turning and shoulder shrug are intact CN XII: Tongue is midline with normal movements and no atrophy.  MOTOR: There is no pronator drift of out-stretched arms. Muscle bulk and tone are normal. Muscle strength is normal.  REFLEXES: Reflexes are 2+ and symmetric at the biceps, triceps, knees, and ankles. Plantar responses are flexor.  SENSORY: Mild length dependent decreased light touch pinprick  COORDINATION: Rapid alternating movements and fine finger movements are intact. There is no dysmetria on finger-to-nose and heel-knee-shin. There are no abnormal or extraneous movements.   GAIT/STANCE: Steady gait   DIAGNOSTIC DATA (LABS, IMAGING, TESTING) - I reviewed patient records, labs, notes, testing and imaging myself where available.  Lab Results  Component Value Date   WBC 8.6 07/13/2015   HGB 12.1* 07/13/2015   HCT 39.3 07/13/2015   MCV 85.1 07/13/2015   PLT 269 07/13/2015      Component Value Date/Time   NA 136 07/13/2015 1451   K 4.5 07/13/2015 1451   CL  99* 07/13/2015 1451  CO2 29 07/13/2015 1451   GLUCOSE 348* 07/13/2015 1451   BUN 43* 07/13/2015 1451   CREATININE 1.90* 07/13/2015 1451   CREATININE 1.48* 04/24/2015 1227   CALCIUM 8.8* 07/13/2015 1451   PROT 6.2* 06/30/2015 0544   ALBUMIN 2.7* 06/30/2015 0544   AST 19 06/30/2015 0544   ALT 17 06/30/2015 0544   ALKPHOS 36* 06/30/2015 0544   BILITOT 0.5 06/30/2015 0544   GFRNONAA 32* 07/13/2015 1451   GFRAA 37* 07/13/2015 1451   Lab Results  Component Value Date   CHOL 136 06/20/2013   HDL 32.00* 06/20/2013   LDLCALC 76 06/20/2013   TRIG 142.0 06/20/2013   CHOLHDL 4 06/20/2013   Lab Results  Component Value Date    ASSESSMENT AND PLAN  Coleby WARNER LADUCA is a 82 y.o. male with history of gradual onset memory trouble, Mini-Mental Status Examination is 28 out of 30  Mild cognitive impairment  Keep Namenda 10 mg twice a day, Aricept 10 mg daily refilled his medications  encouraging moderate exercise, return to clinic for new issues  Marcial Pacas, M.D. Ph.D.  [facility] [address], [place], [place] [address] Ph: [phone] Fax: [phone]

## 2015-08-24 ENCOUNTER — Encounter: Payer: Self-pay | Admitting: Internal Medicine

## 2015-08-26 ENCOUNTER — Telehealth: Payer: Self-pay | Admitting: *Deleted

## 2015-08-26 MED ORDER — PANTOPRAZOLE SODIUM 20 MG PO TBEC: 20.0000 mg | DELAYED_RELEASE_TABLET | Freq: Every day | ORAL | Status: DC

## 2015-08-26 NOTE — Telephone Encounter (Signed)
Med sent electronically (Protonix) per fax request from [facility]

## 2015-08-31 ENCOUNTER — Other Ambulatory Visit: Payer: Self-pay | Admitting: Pulmonary Disease

## 2015-09-04 DIAGNOSIS — E1151 Type 2 diabetes mellitus with diabetic peripheral angiopathy without gangrene: Secondary | ICD-10-CM | POA: Diagnosis not present

## 2015-09-04 DIAGNOSIS — L84 Corns and callosities: Secondary | ICD-10-CM | POA: Diagnosis not present

## 2015-09-04 DIAGNOSIS — L97519 Non-pressure chronic ulcer of other part of right foot with unspecified severity: Secondary | ICD-10-CM | POA: Diagnosis not present

## 2015-09-04 DIAGNOSIS — L603 Nail dystrophy: Secondary | ICD-10-CM | POA: Diagnosis not present

## 2015-09-04 DIAGNOSIS — I739 Peripheral vascular disease, unspecified: Secondary | ICD-10-CM | POA: Diagnosis not present

## 2015-09-08 ENCOUNTER — Encounter (HOSPITAL_COMMUNITY): Payer: Self-pay | Admitting: *Deleted

## 2015-09-08 NOTE — Progress Notes (Signed)
Mr. Antonio Ray had me speak to his son, Antonio Ray.  Mr Antonio Ray said that patient is not going to mediation i n the am and that he would not be taking Insulin.  "He begins to show signs ogf'low blood sugar at 82, so we will wait until after the procedure.'

## 2015-09-09 ENCOUNTER — Ambulatory Visit (HOSPITAL_COMMUNITY)
Admission: RE | Admit: 2015-09-09 | Discharge: 2015-09-09 | Disposition: A | Discharge status: Home / Self Care | Payer: Medicare Other | Source: Ambulatory Visit | Attending: Gastroenterology | Admitting: Gastroenterology

## 2015-09-09 ENCOUNTER — Ambulatory Visit (HOSPITAL_COMMUNITY): Payer: Medicare Other | Admitting: Certified Registered Nurse Anesthetist

## 2015-09-09 ENCOUNTER — Ambulatory Visit (HOSPITAL_COMMUNITY): Payer: Medicare Other

## 2015-09-09 ENCOUNTER — Encounter (HOSPITAL_COMMUNITY): Payer: Self-pay | Admitting: Gastroenterology

## 2015-09-09 ENCOUNTER — Encounter (HOSPITAL_COMMUNITY)
Admission: RE | Disposition: A | Discharge status: Home / Self Care | Payer: Self-pay | Source: Ambulatory Visit | Attending: Gastroenterology

## 2015-09-09 DIAGNOSIS — Z79899 Other long term (current) drug therapy: Secondary | ICD-10-CM | POA: Diagnosis not present

## 2015-09-09 DIAGNOSIS — Z87891 Personal history of nicotine dependence: Secondary | ICD-10-CM | POA: Diagnosis not present

## 2015-09-09 DIAGNOSIS — K220 Achalasia of cardia: Secondary | ICD-10-CM | POA: Insufficient documentation

## 2015-09-09 DIAGNOSIS — I509 Heart failure, unspecified: Secondary | ICD-10-CM | POA: Diagnosis not present

## 2015-09-09 DIAGNOSIS — Z794 Long term (current) use of insulin: Secondary | ICD-10-CM | POA: Diagnosis not present

## 2015-09-09 DIAGNOSIS — I252 Old myocardial infarction: Secondary | ICD-10-CM | POA: Insufficient documentation

## 2015-09-09 DIAGNOSIS — Z85828 Personal history of other malignant neoplasm of skin: Secondary | ICD-10-CM | POA: Insufficient documentation

## 2015-09-09 DIAGNOSIS — K219 Gastro-esophageal reflux disease without esophagitis: Secondary | ICD-10-CM | POA: Insufficient documentation

## 2015-09-09 DIAGNOSIS — E785 Hyperlipidemia, unspecified: Secondary | ICD-10-CM | POA: Diagnosis not present

## 2015-09-09 DIAGNOSIS — Z95810 Presence of automatic (implantable) cardiac defibrillator: Secondary | ICD-10-CM | POA: Diagnosis not present

## 2015-09-09 DIAGNOSIS — N183 Chronic kidney disease, stage 3 (moderate): Secondary | ICD-10-CM | POA: Diagnosis not present

## 2015-09-09 DIAGNOSIS — J449 Chronic obstructive pulmonary disease, unspecified: Secondary | ICD-10-CM | POA: Insufficient documentation

## 2015-09-09 DIAGNOSIS — R1310 Dysphagia, unspecified: Secondary | ICD-10-CM

## 2015-09-09 DIAGNOSIS — K222 Esophageal obstruction: Secondary | ICD-10-CM

## 2015-09-09 DIAGNOSIS — K2289 Other specified disease of esophagus: Secondary | ICD-10-CM

## 2015-09-09 DIAGNOSIS — E119 Type 2 diabetes mellitus without complications: Secondary | ICD-10-CM | POA: Diagnosis not present

## 2015-09-09 DIAGNOSIS — K228 Other specified diseases of esophagus: Secondary | ICD-10-CM

## 2015-09-09 DIAGNOSIS — Z7982 Long term (current) use of aspirin: Secondary | ICD-10-CM | POA: Diagnosis not present

## 2015-09-09 DIAGNOSIS — Z7951 Long term (current) use of inhaled steroids: Secondary | ICD-10-CM | POA: Diagnosis not present

## 2015-09-09 DIAGNOSIS — I2510 Atherosclerotic heart disease of native coronary artery without angina pectoris: Secondary | ICD-10-CM | POA: Insufficient documentation

## 2015-09-09 HISTORY — DX: Concussion with loss of consciousness status unknown, initial encounter: S06.0XAA

## 2015-09-09 HISTORY — DX: Unspecified hearing loss, unspecified ear: H91.90

## 2015-09-09 HISTORY — PX: ESOPHAGOGASTRODUODENOSCOPY (EGD) WITH PROPOFOL: SHX5813

## 2015-09-09 HISTORY — PX: BALLOON DILATION: SHX5330

## 2015-09-09 HISTORY — DX: Concussion with loss of consciousness of unspecified duration, initial encounter: S06.0X9A

## 2015-09-09 LAB — GLUCOSE, CAPILLARY: Glucose-Capillary: 151 mg/dL — ABNORMAL HIGH (ref 65–99)

## 2015-09-09 SURGERY — ESOPHAGOGASTRODUODENOSCOPY (EGD) WITH PROPOFOL
Anesthesia: General

## 2015-09-09 MED ORDER — PROPOFOL 10 MG/ML IV BOLUS
INTRAVENOUS | Status: DC | PRN
  Administered 2015-09-09: 130 mg via INTRAVENOUS

## 2015-09-09 MED ORDER — SODIUM CHLORIDE 0.9 % IV SOLN: INTRAVENOUS | Status: DC

## 2015-09-09 MED ORDER — DIATRIZOATE MEGLUMINE & SODIUM 66-10 % PO SOLN: 90.0000 mL | Freq: Once | ORAL | Status: DC

## 2015-09-09 MED ORDER — PHENYLEPHRINE HCL 10 MG/ML IJ SOLN
INTRAMUSCULAR | Status: DC | PRN
  Administered 2015-09-09: 80 ug via INTRAVENOUS
  Administered 2015-09-09: 40 ug via INTRAVENOUS

## 2015-09-09 MED ORDER — LACTATED RINGERS IV SOLN
INTRAVENOUS | Status: DC
  Administered 2015-09-09: 1000 mL via INTRAVENOUS

## 2015-09-09 MED ORDER — IOHEXOL 300 MG/ML  SOLN
150.0000 mL | Freq: Once | INTRAMUSCULAR | Status: AC | PRN
  Administered 2015-09-09: 150 mL via ORAL

## 2015-09-09 MED ORDER — ONDANSETRON HCL 4 MG/2ML IJ SOLN
INTRAMUSCULAR | Status: DC | PRN
  Administered 2015-09-09: 4 mg via INTRAVENOUS

## 2015-09-09 MED ORDER — LACTATED RINGERS IV SOLN: INTRAVENOUS | Status: DC

## 2015-09-09 MED ORDER — SUCCINYLCHOLINE CHLORIDE 20 MG/ML IJ SOLN
INTRAMUSCULAR | Status: DC | PRN
  Administered 2015-09-09: 110 mg via INTRAVENOUS

## 2015-09-09 NOTE — H&P (Signed)
[facility] History and Physical   Primary Care Physician:  Antonio Obey, Antonio Ray   Reason for Procedure:   dysphagia Plan:     EGD with pneumatic 69m balloon dilation The risks and benefits as well as alternatives of endoscopic procedure(s) have been discussed and reviewed. All questions answered. The patient and familyagrees to proceed.      HPI: Antonio BORGHIis a 82y.o. male  with history of COPD,  CHF status post AICD and achalasia 2 with dysphagia and regurgitation.  His symptoms improved status post-4 quadrant Botox injection a few months ago.  He is here for pneumatic balloon dilation.   Past Medical History  Diagnosis Date  . GERD (gastroesophageal reflux disease)   . Coronary artery disease     History of remote anterior MI with PCI to LAD in 2006; most recent cath 2007, no intervention required  . Chronic systolic dysfunction of left ventricle     EF 30-35%, CLASS II - III SYMPTOMS; intolerant to Coreg  . Hyperlipidemia   . PVC's (premature ventricular contractions)   . BPH (benign prostatic hyperplasia)   . CHF (congestive heart failure) ([facility]   . COPD (chronic obstructive pulmonary disease) ([place]   . Heart murmur     hx  . Pneumonia "several times"    aspiration pna with at least 3 admits for this 2016.   . Type II diabetes mellitus ([facility]   . CKD (chronic kidney disease) stage 3, GFR 30-59 ml/min 06/08/2012  . Skin cancer "several"    "forearms; head"  . Memory loss     improved  . Anginal pain ([facility]   . Dysrhythmia   . Myocardial infarction ([place] 1994    ANTERIOR  . AICD (automatic cardioverter/defibrillator) present 03/30/2011    Analyze ST study patient  . DOE (dyspnea on exertion)     with heavy exertion  . Achalasia   . Renal failure   . Esophageal dysmotility   . Head injury, closed, with concussion ([place] 2000[place]  . HOH (hard of hearing)     Past Surgical History  Procedure Laterality Date  . Foot fracture surgery Right 1980's  .  Cholecystectomy  2/11  . Cataract extraction w/ intraocular lens  implant, bilateral  08/2014-09/2014  . Cardiac catheterization  01/11/2006    DEMONSTRATES AKINESIA OF THE DISTAL ANTERIOR WALL, DISTAL INFERIOR WALL AND AKINESIA OF THE APEX. THE BASAL SEGMENTS CONTRACT WELL AND OVERALL EF 35%  . Coronary angioplasty  1994    TO THE LAD  . Skin cancer excision  "several"    "forearms, head"  . Esophageal manometry N/A 03/16/2015    Procedure: ESOPHAGEAL MANOMETRY (EM);  Surgeon: Antonio Bears Antonio Ray;  [facility];  Service: Gastroenterology;  Laterality: N/A;  . Insert / replace / remove pacemaker    . Esophagogastroduodenoscopy N/A 04/08/2015    Procedure: ESOPHAGOGASTRODUODENOSCOPY (EGD);  Surgeon: DMilus Banister Antonio Ray;  [place] [facility]  Service: Endoscopy;  Laterality: N/A;  . Botox injection N/A 04/08/2015    Procedure: BOTOX INJECTION;  Surgeon: DMilus Banister Antonio Ray;  [place] [facility]  Service: Endoscopy;  Laterality: N/A;    Prior to Admission medications   Medication Sig Start Date End Date Taking? Authorizing Provider  aspirin 81 MG EC tablet Take 81 mg by mouth daily.     Yes Historical Provider, Antonio Ray  budesonide-formoterol (SYMBICORT) 160-4.5 MCG/ACT inhaler Inhale 2 puffs into the lungs 2 (two) times daily. 09/19/14  Yes DJuanito Doom Antonio Ray  Cholecalciferol (VITAMIN D PO) Take 2,000 Units by mouth daily.    Yes Historical Provider, Antonio Ray  donepezil (ARICEPT) 10 MG tablet Take 1 tablet (10 mg total) by mouth every morning. 08/18/15  Yes Antonio Pacas, Antonio Ray  fenofibrate (TRICOR) 145 MG tablet TAKE 1 TABLET BY MOUTH EVERY DAY 01/22/15  Yes Antonio M Martinique, Antonio Ray  furosemide (LASIX) 20 MG tablet Take 2 tablets (40 mg total) by mouth daily. 07/02/15  Yes Antonio Dubois, Antonio Ray  LANTUS SOLOSTAR 100 UNIT/ML Solostar Pen Inject 28 Units into the skin every morning.  08/18/15  Yes Historical Provider, Antonio Ray  loratadine (CLARITIN) 10 MG tablet Take 1 tablet (10 mg total) by mouth daily. 07/02/15  Yes  Antonio Dubois, Antonio Ray  memantine (NAMENDA) 10 MG tablet Take 1 tablet (10 mg total) by mouth 2 (two) times daily. 08/18/15  Yes Antonio Pacas, Antonio Ray  Multiple Vitamins-Minerals (PRESERVISION AREDS 2) CAPS Take 1 capsule by mouth 2 (two) times daily.   Yes Historical Provider, Antonio Ray  nitroGLYCERIN (NITRODUR - DOSED IN MG/24 HR) 0.2 mg/hr patch PLACE 1 PATCH ONTO THE SKIN DAILY 08/19/15  Yes Antonio Grayer, Antonio Ray  pantoprazole (PROTONIX) 20 MG tablet Take 1 tablet (20 mg total) by mouth daily. 08/26/15  Yes Antonio Pole, Antonio Ray  pravastatin (PRAVACHOL) 40 MG tablet TAKE 1 TABLET(40 MG) BY MOUTH EVERY EVENING 07/02/15  Yes Antonio M Martinique, Antonio Ray  PROVENTIL HFA 108 (90 BASE) MCG/ACT inhaler Inhale 2 puffs into the lungs every 6 (six) hours as needed for wheezing or shortness of breath. As directed 05/24/13  Yes Historical Provider, Antonio Ray  insulin glargine (LANTUS) 100 UNIT/ML injection Inject 0.1 mLs (10 Units total) into the skin every morning. Patient not taking: Reported on 08/27/2015 05/15/15   Antonio Cletis Media, Antonio Ray  SPIRIVA HANDIHALER 18 MCG inhalation capsule INHALE CONTENTS OF 1 CAPSULE VIA HANDIHALER ONCE DAILY 09/01/15   Antonio Doom, Antonio Ray    No current facility-administered medications for this encounter.    Allergies as of 07/30/2015 - Review Complete 07/16/2015  Allergen Reaction Noted  . Codeine Other (See Comments)   . Dilaudid [hydromorphone hcl] Other (See Comments) 09/10/2013  . Flomax [tamsulosin hcl] Shortness Of Breath 05/19/2012  . Morphine and related Other (See Comments) 05/20/2011  . Sulfa antibiotics Shortness Of Breath 05/23/2012  . Beta adrenergic blockers Other (See Comments) 03/31/2015  . Carvedilol Other (See Comments) 04/18/2011    Family History  Problem Relation Age of Onset  . Stroke Father     Mother history unknown - never met her  . Colon cancer Neg Hx     Social History   Social History  . Marital Status: Widowed    Spouse Name: N/A  . Number of Children: 2  . Years of  Education: GED   Occupational History  . 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 Smoking status: Former Smoker -- 1.00 packs/day for 35 years    Types: Cigarettes    Quit date: 08/01/1992  . Smokeless tobacco: Never Used  . Alcohol Use: 0.0 oz/week    0 Standard drinks or equivalent per week     Comment: Approximately a beer every 2 years  . Drug Use: No  . Sexual Activity: Not Currently   Other Topics Concern  . Not on file   Social History Narrative   Pt lives in [place] alone.  Widowed.   Right-handed.  Rare caffeine use - maybe one cup per month.    Review of Systems: All other review of systems negative except as mentioned in the HPI.  Physical Exam: Vital signs in last 24 hours:     General:   Alert,  Well-developed, well-nourished, pleasant and cooperative in NAD Lungs:  Clear throughout to auscultation.   Heart:  Regular rate and rhythm; no murmurs, clicks, rubs,  or gallops. Abdomen:  Soft, nontender and nondistended. Normal bowel sounds.   Neuro/Psych:  Alert and cooperative. Normal mood and affect. A and O x 3   _0 .Denzil Magnuson, Antonio Ray [facility] [phone] (pager) 09/09/2015 9:14 AM@

## 2015-09-09 NOTE — Discharge Instructions (Signed)
Esophageal Dilatation °Esophageal dilatation is a procedure to open a blocked or narrowed part of the esophagus. The esophagus is the long tube in your throat that carries food and liquid from your mouth to your stomach. The procedure is also called esophageal dilation.  °You may need this procedure if you have a buildup of scar tissue in your esophagus that makes it difficult, painful, or even impossible to swallow. This can be caused by gastroesophageal reflux disease (GERD). In rare cases, people need this procedure because they have cancer of the esophagus or a problem with the way food moves through the esophagus. Sometimes you may need to have another dilatation to enlarge the opening of the esophagus gradually. °LET YOUR HEALTH CARE PROVIDER KNOW ABOUT:  °· Any allergies you have. °· All medicines you are taking, including vitamins, herbs, eye drops, creams, and over-the-counter medicines. °· Previous problems you or members of your family have had with the use of anesthetics. °· Any blood disorders you have. °· Previous surgeries you have had. °· Medical conditions you have. °· Any antibiotic medicines you are required to take before dental procedures. °RISKS AND COMPLICATIONS °Generally, this is a safe procedure. However, problems can occur and include: °· Bleeding from a tear in the lining of the esophagus. °· A hole (perforation) in the esophagus. °BEFORE THE PROCEDURE °· Do not eat or drink anything after midnight on the night before the procedure or as directed by your health care provider. °· Ask your health care provider about changing or stopping your regular medicines. This is especially important if you are taking diabetes medicines or blood thinners. °· Plan to have someone take you home after the procedure. °PROCEDURE  °· You will be given a medicine that makes you relaxed and sleepy (sedative). °· A medicine may be sprayed or gargled to numb the back of the throat. °· Your health care provider  can use various instruments to do an esophageal dilatation. During the procedure, the instrument used will be placed in your mouth and passed down into your esophagus. Options include: °¨ Simple dilators. This instrument is carefully placed in the esophagus to stretch it. °¨ Guided wire bougies. In this method, a flexible tube (endoscope) is used to insert a wire into the esophagus. The dilator is passed over this wire to enlarge the esophagus. Then the wire is removed. °¨ Balloon dilators. An endoscope with a small balloon at the end is passed down into the esophagus. Inflating the balloon gently stretches the esophagus and opens it up. °AFTER THE PROCEDURE °· Your blood pressure, heart rate, breathing rate, and blood oxygen level will be monitored often until the medicines you were given have worn off. °· Your throat may feel slightly sore and will probably still feel numb. This will improve slowly over time. °· You will not be allowed to eat or drink until the throat numbness has resolved. °· If this is a same-day procedure, you may be allowed to go home once you have been able to drink, urinate, and sit on the edge of the bed without nausea or dizziness. °· If this is a same-day procedure, you should have a friend or family member with you for the next 24 hours after the procedure. °  °This information is not intended to replace advice given to you by your health care provider. Make sure you discuss any questions you have with your health care provider. °  °Document Released: 09/08/2005 Document Revised: 08/08/2014 Document Reviewed: 11/27/2013 °Elsevier   Interactive Patient Education 2016 [place] Anesthesia, Adult, Care After Refer to this sheet in the next few weeks. These instructions provide you with information on caring for yourself after your procedure. Your health care provider may also give you more specific instructions. Your treatment has been planned according to current medical  practices, but problems sometimes occur. Call your health care provider if you have any problems or questions after your procedure. WHAT TO EXPECT AFTER THE PROCEDURE After the procedure, it is typical to experience:  Sleepiness.  Nausea and vomiting. HOME CARE INSTRUCTIONS  For the first 24 hours after general anesthesia:  Have a responsible person with you.  Do not drive a car. If you are alone, do not take public transportation.  Do not drink alcohol.  Do not take medicine that has not been prescribed by your health care provider.  Do not sign important papers or make important decisions.  You may resume a normal diet and activities as directed by your health care provider.  Change bandages (dressings) as directed.  If you have questions or problems that seem related to general anesthesia, call the hospital and ask for the anesthetist or anesthesiologist on call. SEEK MEDICAL CARE IF:  You have nausea and vomiting that continue the day after anesthesia.  You develop a rash. SEEK IMMEDIATE MEDICAL CARE IF:   You have difficulty breathing.  You have chest pain.  You have any allergic problems.   This information is not intended to replace advice given to you by your health care provider. Make sure you discuss any questions you have with your health care provider.   Document Released: 10/24/2000 Document Revised: 08/08/2014 Document Reviewed: 11/16/2011 Elsevier Interactive Patient Education 2016 [name]. Esophagogastroduodenoscopy, Care After Refer to this sheet in the next few weeks. These instructions provide you with information about caring for yourself after your procedure. Your health care provider may also give you more specific instructions. Your treatment has been planned according to current medical practices, but problems sometimes occur. Call your health care provider if you have any problems or questions after your procedure. WHAT TO EXPECT AFTER THE  PROCEDURE After your procedure, it is typical to feel:  Soreness in your throat.  Pain with swallowing.  Sick to your stomach (nauseous).  Bloated.  Dizzy.  Fatigued. HOME CARE INSTRUCTIONS  Do not eat or drink anything until the numbing medicine (local anesthetic) has worn off and your gag reflex has returned. You will know that the local anesthetic has worn off when you can swallow comfortably.  Do not drive or operate machinery until directed by your health care provider.  Take medicines only as directed by your health care provider. SEEK MEDICAL CARE IF:   You cannot stop coughing.  You are not urinating at all or less than usual. SEEK IMMEDIATE MEDICAL CARE IF:  You have difficulty swallowing.  You cannot eat or drink.  You have worsening throat or chest pain.  You have dizziness or lightheadedness or you faint.  You have nausea or vomiting.  You have chills.  You have a fever.  You have severe abdominal pain.  You have black, tarry, or bloody stools.   This information is not intended to replace advice given to you by your health care provider. Make sure you discuss any questions you have with your health care provider.   Document Released: 07/04/2012 Document Revised: 08/08/2014 Document Reviewed: 07/04/2012 Elsevier Interactive Patient Education [employer].

## 2015-09-09 NOTE — Op Note (Signed)
[facility] [place], [address]   ENDOSCOPY PROCEDURE REPORT  PATIENT: Antonio Ray, Antonio Ray  MR#: [id number] BIRTHDATE: 11/29/1933 , 59  yrs. old GENDER: male ENDOSCOPIST: Harl Bowie, MD REFERRED BY:  [name], M.D. PROCEDURE DATE:  09/09/2015 PROCEDURE:  EGD w/ balloon dilation ASA CLASS:     Class IV INDICATIONS:  Achalasia type II, Dysphagia. MEDICATIONS: Per Anesthesia TOPICAL ANESTHETIC: none  DESCRIPTION OF PROCEDURE: After the risks benefits and alternatives of the procedure were thoroughly explained, informed consent was obtained.  The Pentax Gastroscope [id number] endoscope was introduced through the mouth and advanced to the second portion of the duodenum , Limited by equipment malfunction of the fluoro C-arm. The instrument was slowly withdrawn as the mucosa was fully examined.   EXAM: The esophagus and gastroesophageal junction were completely normal in appearance except fro mild resistance in the passing the scope through EG junction  The stomach was entered and closely examined.The antrum, angularis, and lesser curvature were well visualized, including a retroflexed view of the cardia and fundus. The stomach wall was normally distensable.  The scope passed easily through the pylorus into the duodenum.  Retroflexed views revealed no abnormalities.     The scope was then withdrawn from the patient and the procedure completed. Guidewire was inserted through scope. Pneumatic 51mm balloon dilation was performed under Fluro as per standard technique. Small amount of heme noted on the balloon on withdrawal after complete deflation.  COMPLICATIONS: There were no immediate complications.  ENDOSCOPIC IMPRESSION: Normal appearing esophagus and GE junction, the stomach was well visualized and normal in appearance, normal appearing duodenum S/p 30 mm pneumatic balloon dilation  RECOMMENDATIONS: Will follow Gastrograffin  esophagogram prior to discharge Clear liquids for today followed by soft diet for next 1-2 days   REPEAT EXAM: as needed  eSigned:  Harl Bowie, MD 09/09/2015 11:49 AM    CC: Zenovia Jarred, MD

## 2015-09-09 NOTE — Anesthesia Procedure Notes (Signed)
Procedure Name: Intubation Date/Time: 09/09/2015 10:51 AM Performed by: [place] Pink Pre-anesthesia Checklist: Patient identified, Emergency Drugs available, Suction available, Patient being monitored and Timeout performed Patient Re-evaluated:Patient Re-evaluated prior to inductionOxygen Delivery Method: Circle system utilized Preoxygenation: Pre-oxygenation with 100% oxygen Intubation Type: IV induction and Rapid sequence Laryngoscope Size: Mac and 4 Grade View: Grade I Tube type: Oral Tube size: 7.5 mm Number of attempts: 1 Airway Equipment and Method: Stylet Placement Confirmation: ETT inserted through vocal cords under direct vision,  positive ETCO2 and breath sounds checked- equal and bilateral Secured at: 22 cm Tube secured with: Tape Dental Injury: Teeth and Oropharynx as per pre-operative assessment

## 2015-09-09 NOTE — Anesthesia Preprocedure Evaluation (Addendum)
Anesthesia Evaluation  Patient identified by MRN, date of birth, ID band Patient awake    Reviewed: Allergy & Precautions, NPO status   Airway Mallampati: II  TM Distance: >3 FB Neck ROM: Full    Dental   Pulmonary shortness of breath, pneumonia, COPD, former smoker,    breath sounds clear to auscultation       Cardiovascular + angina + CAD, + Past MI, +CHF and + DOE  + dysrhythmias + Cardiac Defibrillator  Rhythm:Regular Rate:Normal     Neuro/Psych    GI/Hepatic GERD  ,  Endo/Other  diabetes  Renal/GU Renal disease     Musculoskeletal   Abdominal   Peds  Hematology   Anesthesia Other Findings   Reproductive/Obstetrics                           Anesthesia Physical Anesthesia Plan  ASA: III  Anesthesia Plan: General   Post-op Pain Management:    Induction: Intravenous, Rapid sequence and Cricoid pressure planned  Airway Management Planned: Oral ETT  Additional Equipment:   Intra-op Plan:   Post-operative Plan: Possible Post-op intubation/ventilation  Informed Consent: I have reviewed the patients History and Physical, chart, labs and discussed the procedure including the risks, benefits and alternatives for the proposed anesthesia with the patient or authorized representative who has indicated his/her understanding and acceptance.   Dental advisory given  Plan Discussed with: CRNA and Anesthesiologist  Anesthesia Plan Comments:       Anesthesia Quick Evaluation

## 2015-09-09 NOTE — Transfer of Care (Signed)
Immediate Anesthesia Transfer of Care Note  Patient: Antonio Ray  Procedure(s) Performed: Procedure(s) with comments: ESOPHAGOGASTRODUODENOSCOPY (EGD) WITH PROPOFOL (N/A) - pt. is to have gastrografin xray post recovery-do not discharge until results are back BALLOON DILATION (N/A) - rigiflex achalasia balloon dilators  Patient Location: Endoscopy Unit  Anesthesia Type:General  Level of Consciousness: awake, alert  and oriented  Airway & Oxygen Therapy: Patient Spontanous Breathing and Patient connected to nasal cannula oxygen  Post-op Assessment: Report given to RN and Post -op Vital signs reviewed and stable  Post vital signs: Reviewed and stable  Last Vitals:  Filed Vitals:   09/09/15 0943  BP: 140/61  Pulse: 72  Temp: 36.8 C  Resp: 17    Complications: No apparent anesthesia complications

## 2015-09-10 ENCOUNTER — Encounter (HOSPITAL_COMMUNITY): Payer: Self-pay | Admitting: Gastroenterology

## 2015-09-11 NOTE — Anesthesia Postprocedure Evaluation (Signed)
Anesthesia Post Note  Patient: Antonio Ray  Procedure(s) Performed: Procedure(s) (LRB): ESOPHAGOGASTRODUODENOSCOPY (EGD) WITH PROPOFOL (N/A) BALLOON DILATION (N/A)  Patient location during evaluation: Endoscopy Anesthesia Type: MAC Level of consciousness: awake Pain management: pain level controlled Respiratory status: spontaneous breathing Cardiovascular status: stable Anesthetic complications: no    Last Vitals:  Filed Vitals:   09/09/15 1235 09/09/15 1245  BP: 118/49 112/48  Pulse: 59 59  Temp:    Resp: 18 18    Last Pain: There were no vitals filed for this visit.               EDWARDS,Camaria Gerald

## 2015-09-14 ENCOUNTER — Other Ambulatory Visit: Payer: Self-pay | Admitting: Cardiology

## 2015-09-14 ENCOUNTER — Other Ambulatory Visit: Payer: Self-pay | Admitting: *Deleted

## 2015-09-14 MED ORDER — NITROGLYCERIN 0.2 MG/HR TD PT24: 0.2000 mg | MEDICATED_PATCH | Freq: Every day | TRANSDERMAL | Status: DC

## 2015-09-15 ENCOUNTER — Other Ambulatory Visit: Payer: Self-pay | Admitting: *Deleted

## 2015-09-15 MED ORDER — NITROGLYCERIN 0.2 MG/HR TD PT24: 0.2000 mg | MEDICATED_PATCH | Freq: Every day | TRANSDERMAL | Status: DC

## 2015-09-23 ENCOUNTER — Encounter: Payer: Self-pay | Admitting: Gastroenterology

## 2015-10-14 ENCOUNTER — Encounter: Payer: Self-pay | Admitting: Pulmonary Disease

## 2015-10-14 ENCOUNTER — Ambulatory Visit (INDEPENDENT_AMBULATORY_CARE_PROVIDER_SITE_OTHER): Payer: Medicare Other | Admitting: Pulmonary Disease

## 2015-10-14 VITALS — BP 126/64 | HR 68 | Ht 70.0 in | Wt 199.0 lb

## 2015-10-14 DIAGNOSIS — L821 Other seborrheic keratosis: Secondary | ICD-10-CM | POA: Diagnosis not present

## 2015-10-14 DIAGNOSIS — Z23 Encounter for immunization: Secondary | ICD-10-CM | POA: Diagnosis not present

## 2015-10-14 DIAGNOSIS — Z85828 Personal history of other malignant neoplasm of skin: Secondary | ICD-10-CM | POA: Diagnosis not present

## 2015-10-14 DIAGNOSIS — J449 Chronic obstructive pulmonary disease, unspecified: Secondary | ICD-10-CM

## 2015-10-14 DIAGNOSIS — D225 Melanocytic nevi of trunk: Secondary | ICD-10-CM | POA: Diagnosis not present

## 2015-10-14 DIAGNOSIS — D485 Neoplasm of uncertain behavior of skin: Secondary | ICD-10-CM | POA: Diagnosis not present

## 2015-10-14 DIAGNOSIS — L308 Other specified dermatitis: Secondary | ICD-10-CM | POA: Diagnosis not present

## 2015-10-14 DIAGNOSIS — L570 Actinic keratosis: Secondary | ICD-10-CM | POA: Diagnosis not present

## 2015-10-14 NOTE — Assessment & Plan Note (Signed)
This has been a stable interval for him and he has not had a recurrence of exaceraitons.  I think these were related to his recurrent achalasia which is now better s/p his balloon dilation.    Plan: Continue walking regularly Continue Symbicort and Spiriva If he has recurrent exacerbations then we would need to consider adding daily azithromycin, but I think as the achalasia has improved and he is no longer having exacerbations this would not be necessary Follow-up 6 months or sooner if needed

## 2015-10-14 NOTE — Progress Notes (Signed)
Subjective:    Patient ID: Antonio Ray, male    DOB: Apr 12, 1934, 82 y.o.   MRN: [id number]  Synopsis: First the [facility] in 2015 for severe COPD. His FEV1 at that time was 29% predicted. He also has a scar in his right lung base believed to be related to pneumonia. He also has a history of achalasia and was admitted for healthcare associated pneumonia multiple times in 2016. He underwent Botox injections in his distal esophagus in late 2016.  HPI  Chief Complaint  Patient presents with  . Follow-up    Pt states he is doing well today-no complaints.     Since his last visit he has undergone balloon dilation of his esophagus.  He said that the procedure went well without difficulty.  He doesn't feel like food is getting stuck in his throat any more.   He has not been back to the hospital again and his breathing has been better. No problems with Symbicort and Spiriva, no problems with those. He is still walking 1/2 mile or more daily.   He has a slight amount of chest congestion and mucus production a few times a day which varies  Past Medical History  Diagnosis Date  . GERD (gastroesophageal reflux disease)   . Coronary artery disease     History of remote anterior MI with PCI to LAD in 2006; most recent cath 2007, no intervention required  . Chronic systolic dysfunction of left ventricle     EF 30-35%, CLASS II - III SYMPTOMS; intolerant to Coreg  . Hyperlipidemia   . PVC's (premature ventricular contractions)   . BPH (benign prostatic hyperplasia)   . CHF (congestive heart failure) ([place])   . COPD (chronic obstructive pulmonary disease) ([place])   . Heart murmur     hx  . Pneumonia "several times"    aspiration pna with at least 3 admits for this 2016.   . Type II diabetes mellitus ([place])   . CKD (chronic kidney disease) stage 3, GFR 30-59 ml/min 06/08/2012  . Skin cancer "several"    "forearms; head"  . Memory loss     improved  . Anginal pain ([place])   .  Dysrhythmia   . Myocardial infarction ([place]) 1994    ANTERIOR  . AICD (automatic cardioverter/defibrillator) present 03/30/2011    Analyze ST study patient  . DOE (dyspnea on exertion)     with heavy exertion  . Achalasia   . Renal failure   . Esophageal dysmotility   . Head injury, closed, with concussion ([place]) 2000ish  . HOH (hard of hearing)      Review of Systems     Objective:   Physical Exam  Filed Vitals:   10/14/15 0923  BP: 126/64  Pulse: 68  Height: 5\' 10"  (1.778 m)  Weight: 199 lb (90.266 kg)  SpO2: 97%  Ra  Gen: well appearing, no acute distress HEENT: NCAT, EOMi, OP clear PULM: Few crackles right base but no wheezing, normal air movement today CV: RRR, no mgr, no JVD AB: BS+, soft, nontender, no hsm Ext: warm, no edema, no clubbing, no cyanosis Derm: no rash or skin breakdown  Records from his recent balloon dilation of the esophagus reviewed  December 2016 chest x-ray without evidence of pneumonia     Assessment & Plan:   COPD GOLD IV This has been a stable interval for him and he has not had a recurrence of exaceraitons.  I think these were related  to his recurrent achalasia which is now better s/p his balloon dilation.    Plan: Continue walking regularly Continue Symbicort and Spiriva If he has recurrent exacerbations then we would need to consider adding daily azithromycin, but I think as the achalasia has improved and he is no longer having exacerbations this would not be necessary Follow-up 6 months or sooner if needed     Updated Medication List Outpatient Encounter Prescriptions as of 10/14/2015  Medication Sig  . aspirin 81 MG EC tablet Take 81 mg by mouth daily.    . budesonide-formoterol (SYMBICORT) 160-4.5 MCG/ACT inhaler Inhale 2 puffs into the lungs 2 (two) times daily.  . Cholecalciferol (VITAMIN D PO) Take 2,000 Units by mouth daily.   Marland Kitchen donepezil (ARICEPT) 10 MG tablet Take 1 tablet (10 mg total) by mouth every morning.  .  fenofibrate (TRICOR) 145 MG tablet TAKE 1 TABLET BY MOUTH EVERY DAY  . furosemide (LASIX) 20 MG tablet Take 2 tablets (40 mg total) by mouth daily.  Marland Kitchen LANTUS SOLOSTAR 100 UNIT/ML Solostar Pen Inject 28 Units into the skin every morning.   . loratadine (CLARITIN) 10 MG tablet Take 1 tablet (10 mg total) by mouth daily.  . memantine (NAMENDA) 10 MG tablet Take 1 tablet (10 mg total) by mouth 2 (two) times daily.  . Multiple Vitamins-Minerals (PRESERVISION AREDS 2) CAPS Take 1 capsule by mouth 2 (two) times daily.  . nitroGLYCERIN (NITRODUR - DOSED IN MG/24 HR) 0.2 mg/hr patch Place 1 patch (0.2 mg total) onto the skin daily.  . pantoprazole (PROTONIX) 20 MG tablet Take 1 tablet (20 mg total) by mouth daily.  . pravastatin (PRAVACHOL) 40 MG tablet TAKE 1 TABLET(40 MG) BY MOUTH EVERY EVENING  . PROVENTIL HFA 108 (90 BASE) MCG/ACT inhaler Inhale 2 puffs into the lungs every 6 (six) hours as needed for wheezing or shortness of breath. As directed  . SPIRIVA HANDIHALER 18 MCG inhalation capsule INHALE CONTENTS OF 1 CAPSULE VIA HANDIHALER ONCE DAILY   No facility-administered encounter medications on file as of 10/14/2015.

## 2015-10-14 NOTE — Patient Instructions (Signed)
Continue taking Symbicort and Spiriva Please let me know if you have another exacerbation of COPD Otherwise, we will plan on seeing him back in 6 months

## 2015-10-15 ENCOUNTER — Encounter: Payer: Self-pay | Admitting: Internal Medicine

## 2015-10-23 ENCOUNTER — Encounter: Payer: Self-pay | Admitting: Internal Medicine

## 2015-10-26 ENCOUNTER — Other Ambulatory Visit: Payer: Self-pay | Admitting: Pulmonary Disease

## 2015-10-26 DIAGNOSIS — N189 Chronic kidney disease, unspecified: Secondary | ICD-10-CM | POA: Diagnosis not present

## 2015-10-26 DIAGNOSIS — E1142 Type 2 diabetes mellitus with diabetic polyneuropathy: Secondary | ICD-10-CM | POA: Diagnosis not present

## 2015-10-26 DIAGNOSIS — Z794 Long term (current) use of insulin: Secondary | ICD-10-CM | POA: Diagnosis not present

## 2015-10-30 ENCOUNTER — Encounter: Payer: Self-pay | Admitting: Cardiology

## 2015-10-30 ENCOUNTER — Ambulatory Visit (INDEPENDENT_AMBULATORY_CARE_PROVIDER_SITE_OTHER): Payer: Medicare Other | Admitting: Cardiology

## 2015-10-30 VITALS — BP 110/66 | HR 60 | Ht 70.0 in | Wt 199.6 lb

## 2015-10-30 DIAGNOSIS — I5022 Chronic systolic (congestive) heart failure: Secondary | ICD-10-CM | POA: Insufficient documentation

## 2015-10-30 DIAGNOSIS — I2510 Atherosclerotic heart disease of native coronary artery without angina pectoris: Secondary | ICD-10-CM | POA: Diagnosis not present

## 2015-10-30 DIAGNOSIS — Z95810 Presence of automatic (implantable) cardiac defibrillator: Secondary | ICD-10-CM | POA: Diagnosis not present

## 2015-10-30 MED ORDER — FUROSEMIDE 20 MG PO TABS: 40.0000 mg | ORAL_TABLET | Freq: Every day | ORAL | Status: DC

## 2015-10-30 NOTE — Patient Instructions (Signed)
Continue your current therapy  I will see you in 6 months.   

## 2015-10-31 NOTE — Progress Notes (Signed)
Antonio Ray Date of Birth: [birth year] Medical Record #[id number]  History of Present Illness: Antonio Ray is seen for follow up CAD and CHF.  He is seen with his son. He has a history of coronary disease with remote myocardial infarction in 1994. He had emergent angioplasty of the LAD. Cardiac catheterization 2007 showed nonobstructive disease. He has an ischemic cardiomyopathy with ejection fraction of 30-35%. His last Myoview in 2011 showed an anterior wall scar without ischemia. He has an ICD in place. Most recent Echo in September 2016 showed EF had dropped to 10-15% with global hypokinesis and akinesis of the anteroseptum.  He also has a history of recurrent aspiration PNA.   He had a barium swallow that showed achalasia. Apparently manography confirmed this. He has had esophageal dilation. He has a history of memory loss and is now on Aricept and Namenda.  On follow up today he states breathing is fine. No cough. No chest pain. Blood sugars have been under control. No increased swelling. Weight has increased some with increased insulin dose.  Current Outpatient Prescriptions on File Prior to Visit  Medication Sig Dispense Refill  . aspirin 81 MG EC tablet Take 81 mg by mouth daily.      . Cholecalciferol (VITAMIN D PO) Take 2,000 Units by mouth daily.     Marland Kitchen donepezil (ARICEPT) 10 MG tablet Take 1 tablet (10 mg total) by mouth every morning. 90 tablet 3  . fenofibrate (TRICOR) 145 MG tablet TAKE 1 TABLET BY MOUTH EVERY DAY 30 tablet 2  . LANTUS SOLOSTAR 100 UNIT/ML Solostar Pen Inject 28 Units into the skin every morning.   11  . loratadine (CLARITIN) 10 MG tablet Take 1 tablet (10 mg total) by mouth daily. 30 tablet 1  . memantine (NAMENDA) 10 MG tablet Take 1 tablet (10 mg total) by mouth 2 (two) times daily. 180 tablet 3  . Multiple Vitamins-Minerals (PRESERVISION AREDS 2) CAPS Take 1 capsule by mouth 2 (two) times daily.    . nitroGLYCERIN (NITRODUR - DOSED IN MG/24 HR) 0.2 mg/hr  patch Place 1 patch (0.2 mg total) onto the skin daily. 90 patch 3  . pantoprazole (PROTONIX) 20 MG tablet Take 1 tablet (20 mg total) by mouth daily. 30 tablet 11  . pravastatin (PRAVACHOL) 40 MG tablet TAKE 1 TABLET(40 MG) BY MOUTH EVERY EVENING 30 tablet 11  . PROVENTIL HFA 108 (90 BASE) MCG/ACT inhaler Inhale 2 puffs into the lungs every 6 (six) hours as needed for wheezing or shortness of breath. As directed    . SPIRIVA HANDIHALER 18 MCG inhalation capsule INHALE CONTENTS OF 1 CAPSULE VIA HANDIHALER ONCE DAILY 30 capsule 5  . SYMBICORT 160-4.5 MCG/ACT inhaler INHALE 2 PUFFS BY MOUTH TWICE DAILY 10.2 g 6   No current facility-administered medications on file prior to visit.    Allergies  Allergen Reactions  . Codeine Other (See Comments)    Gets very angry, disoriented  . Dilaudid [Hydromorphone Hcl] Other (See Comments)    VERY AGITATED, HOSTILE  . Flomax [Tamsulosin Hcl] Shortness Of Breath  . Morphine And Related Other (See Comments)    VERY AGITATED, HOSTILE  . Sulfa Antibiotics Shortness Of Breath  . Beta Adrenergic Blockers Other (See Comments)    ? disorientation  . Carvedilol Other (See Comments)    DISORIENTATION    Past Medical History  Diagnosis Date  . GERD (gastroesophageal reflux disease)   . Coronary artery disease     History of remote  anterior MI with PCI to LAD in 2006; most recent cath 2007, no intervention required  . Chronic systolic dysfunction of left ventricle     EF 30-35%, CLASS II - III SYMPTOMS; intolerant to Coreg  . Hyperlipidemia   . PVC's (premature ventricular contractions)   . BPH (benign prostatic hyperplasia)   . CHF (congestive heart failure) ([place])   . COPD (chronic obstructive pulmonary disease) ([place])   . Heart murmur     hx  . Pneumonia "several times"    aspiration pna with at least 3 admits for this 2016.   . Type II diabetes mellitus ([place])   . CKD (chronic kidney disease) stage 3, GFR 30-59 ml/min 06/08/2012  . Skin cancer  "several"    "forearms; head"  . Memory loss     improved  . Anginal pain ([place])   . Dysrhythmia   . Myocardial infarction ([place]) 1994    ANTERIOR  . AICD (automatic cardioverter/defibrillator) present 03/30/2011    Analyze ST study patient  . DOE (dyspnea on exertion)     with heavy exertion  . Achalasia   . Renal failure   . Esophageal dysmotility   . Head injury, closed, with concussion ([place]) 2000ish  . HOH (hard of hearing)     Past Surgical History  Procedure Laterality Date  . Foot fracture surgery Right 1980's  . Cholecystectomy  2/11  . Cataract extraction w/ intraocular lens  implant, bilateral  08/2014-09/2014  . Cardiac catheterization  01/11/2006    DEMONSTRATES AKINESIA OF THE DISTAL ANTERIOR WALL, DISTAL INFERIOR WALL AND AKINESIA OF THE APEX. THE BASAL SEGMENTS CONTRACT WELL AND OVERALL EF 35%  . Coronary angioplasty  1994    TO THE LAD  . Skin cancer excision  "several"    "forearms, head"  . Esophageal manometry N/A 03/16/2015    Procedure: ESOPHAGEAL MANOMETRY (EM);  Surgeon: Jerene Bears, MD;  Location: [facility];  Service: Gastroenterology;  Laterality: N/A;  . Insert / replace / remove pacemaker    . Esophagogastroduodenoscopy N/A 04/08/2015    Procedure: ESOPHAGOGASTRODUODENOSCOPY (EGD);  Surgeon: Milus Banister, MD;  Location: [facility];  Service: Endoscopy;  Laterality: N/A;  . Botox injection N/A 04/08/2015    Procedure: BOTOX INJECTION;  Surgeon: Milus Banister, MD;  Location: [facility];  Service: Endoscopy;  Laterality: N/A;  . Esophagogastroduodenoscopy (egd) with propofol N/A 09/09/2015    Procedure: ESOPHAGOGASTRODUODENOSCOPY (EGD) WITH PROPOFOL;  Surgeon: Mauri Pole, MD;  Location: [facility];  Service: Endoscopy;  Laterality: N/A;  pt. is to have gastrografin xray post recovery-do not discharge until results are back  . Balloon dilation N/A 09/09/2015    Procedure: BALLOON DILATION;  Surgeon: Mauri Pole, MD;  Location: [facility];  Service: Endoscopy;  Laterality: N/A;  rigiflex achalasia balloon dilators    History  Smoking status  . Former Smoker -- 1.00 packs/day for 35 years  . Types: Cigarettes  . Quit date: 08/01/1992  Smokeless tobacco  . Never Used    History  Alcohol Use  . 0.0 oz/week  . 0 Standard drinks or equivalent per week    Comment: Approximately a beer every 2 years    Family History  Problem Relation Age of Onset  . Stroke Father     Mother history unknown - never met her  . Colon cancer Neg Hx     Review of Systems: As noted in history of present illness. All other systems were reviewed and are negative.  Physical Exam: BP 110/66 mmHg  Pulse 60  Ht 5' 10"  (1.778 m)  Wt 90.538 kg (199 lb 9.6 oz)  BMI 28.64 kg/m2 Patient is very pleasant and in no acute distress. Skin is warm and dry. Color is normal.  HEENT is unremarkable. Normocephalic/atraumatic. PERRL. Sclera are nonicteric. Neck is supple. No masses. No JVD. Lungs are clear except for few squeeks. Cardiac exam shows a regular rate and rhythm. Abdomen is soft and obese. No masses or bruits . Extremities are without edema.  pedal pulses are good. Gait and ROM are intact. No gross neurologic deficits noted.   LABORATORY DATA: Lab Results  Component Value Date   WBC 8.6 07/13/2015   HGB 12.1* 07/13/2015   HCT 39.3 07/13/2015   PLT 269 07/13/2015   GLUCOSE 348* 07/13/2015   CHOL 136 06/20/2013   TRIG 142.0 06/20/2013   HDL 32.00* 06/20/2013   LDLCALC 76 06/20/2013   ALT 17 06/30/2015   AST 19 06/30/2015   NA 136 07/13/2015   K 4.5 07/13/2015   CL 99* 07/13/2015   CREATININE 1.90* 07/13/2015   BUN 43* 07/13/2015   CO2 29 07/13/2015   INR 1.27 03/31/2015   HGBA1C 8.5* 03/22/2015    BNP 06/29/15 104.   Echo: 04/02/15:Study Conclusions  - Left ventricle: The cavity size was normal. Wall thickness was increased in a pattern of mild LVH. Systolic function was severely reduced. The estimated ejection  fraction was in the range of 10% to 15%. Diffuse hypokinesis. There is akinesis of the anteroseptal myocardium. Doppler parameters are consistent with high ventricular filling pressure. Acoustic contrast opacification revealed no evidence ofthrombus. - Right ventricle: The cavity size was mildly dilated. Wall thickness was normal. Systolic function was moderately reduced.  Impressions:  - Since prior echocardiogram, EF is reduced.   Assessment / Plan: 1. Coronary disease with remote anterior myocardial infarction in 1994. Cardiac catheterization 2007 showed nonobstructive disease. His last stress test in October 2011 showed no ischemia. We will continue with his current medical therapy including aspirin. He has a history of intolerance to carvedilol  because of dizziness. On nitrodur patch.  2. Ischemic cardiomyopathy with chronic systolic CHF. Last EF 10-15%. He is asymptomatic. He appears to be euvolemic. We'll continue with his current diuretic dose. ARB on hold due to acute rise in creatinine.   3. Prophylactic ICD.  4. Hyperlipidemia.   5. Chronic kidney disease stage III.   6. Recurrent PNA- possibly related to aspiration. Followed by pulmonary and GI.  7. Achalasia. Per GI. S/p esophageal dilation.

## 2015-11-02 ENCOUNTER — Encounter: Payer: Medicare Other | Admitting: Internal Medicine

## 2015-11-06 ENCOUNTER — Encounter: Payer: Self-pay | Admitting: Internal Medicine

## 2015-11-06 ENCOUNTER — Ambulatory Visit (INDEPENDENT_AMBULATORY_CARE_PROVIDER_SITE_OTHER): Payer: Medicare Other | Admitting: Internal Medicine

## 2015-11-06 VITALS — BP 152/64 | HR 74 | Ht 68.0 in | Wt 201.6 lb

## 2015-11-06 DIAGNOSIS — I255 Ischemic cardiomyopathy: Secondary | ICD-10-CM

## 2015-11-06 DIAGNOSIS — I5022 Chronic systolic (congestive) heart failure: Secondary | ICD-10-CM | POA: Diagnosis not present

## 2015-11-06 NOTE — Progress Notes (Signed)
PCP: [name], MD Primary Cardiologist:  Dr Martinique  Antonio Ray is a 82 y.o. male who presents today for routine electrophysiology followup.  He is doing reasonably well for his age presently.  Today, he denies symptoms of palpitations, chest pain, shortness of breath,  lower extremity edema, presyncope, syncope, or ICD shocks.  The patient is otherwise without complaint today.   Past Medical History  Diagnosis Date  . GERD (gastroesophageal reflux disease)   . Coronary artery disease     History of remote anterior MI with PCI to LAD in 2006; most recent cath 2007, no intervention required  . Chronic systolic dysfunction of left ventricle     EF 30-35%, CLASS II - III SYMPTOMS; intolerant to Coreg  . Hyperlipidemia   . PVC's (premature ventricular contractions)   . BPH (benign prostatic hyperplasia)   . CHF (congestive heart failure) ([place])   . COPD (chronic obstructive pulmonary disease) ([place])   . Heart murmur     hx  . Pneumonia "several times"    aspiration pna with at least 3 admits for this 2016.   . Type II diabetes mellitus ([place])   . CKD (chronic kidney disease) stage 3, GFR 30-59 ml/min 06/08/2012  . Skin cancer "several"    "forearms; head"  . Memory loss     improved  . Anginal pain ([place])   . Dysrhythmia   . Myocardial infarction ([place]) 1994    ANTERIOR  . AICD (automatic cardioverter/defibrillator) present 03/30/2011    Analyze ST study patient  . DOE (dyspnea on exertion)     with heavy exertion  . Achalasia   . Renal failure   . Esophageal dysmotility   . Head injury, closed, with concussion ([place]) 2000ish  . HOH (hard of hearing)    Past Surgical History  Procedure Laterality Date  . Foot fracture surgery Right 1980's  . Cholecystectomy  2/11  . Cataract extraction w/ intraocular lens  implant, bilateral  08/2014-09/2014  . Cardiac catheterization  01/11/2006    DEMONSTRATES AKINESIA OF THE DISTAL ANTERIOR WALL, DISTAL INFERIOR WALL AND AKINESIA OF THE  APEX. THE BASAL SEGMENTS CONTRACT WELL AND OVERALL EF 35%  . Coronary angioplasty  1994    TO THE LAD  . Skin cancer excision  "several"    "forearms, head"  . Esophageal manometry N/A 03/16/2015    Procedure: ESOPHAGEAL MANOMETRY (EM);  Surgeon: Jerene Bears, MD;  [facility];  Service: Gastroenterology;  Laterality: N/A;  . Insert / replace / remove pacemaker    . Esophagogastroduodenoscopy N/A 04/08/2015    Procedure: ESOPHAGOGASTRODUODENOSCOPY (EGD);  Surgeon: Milus Banister, MD;  Location: [facility];  Service: Endoscopy;  Laterality: N/A;  . Botox injection N/A 04/08/2015    Procedure: BOTOX INJECTION;  Surgeon: Milus Banister, MD;  Location: [facility];  Service: Endoscopy;  Laterality: N/A;  . Esophagogastroduodenoscopy (egd) with propofol N/A 09/09/2015    Procedure: ESOPHAGOGASTRODUODENOSCOPY (EGD) WITH PROPOFOL;  Surgeon: Mauri Pole, MD;  Location: [facility];  Service: Endoscopy;  Laterality: N/A;  pt. is to have gastrografin xray post recovery-do not discharge until results are back  . Balloon dilation N/A 09/09/2015    Procedure: BALLOON DILATION;  Surgeon: Mauri Pole, MD;  Location: [facility];  Service: Endoscopy;  Laterality: N/A;  rigiflex achalasia balloon dilators    Current Outpatient Prescriptions  Medication Sig Dispense Refill  . aspirin 81 MG EC tablet Take 81 mg by mouth daily.      Marland Kitchen  Cholecalciferol (VITAMIN D PO) Take 2,000 Units by mouth daily.     Marland Kitchen donepezil (ARICEPT) 10 MG tablet Take 1 tablet (10 mg total) by mouth every morning. 90 tablet 3  . fenofibrate (TRICOR) 145 MG tablet TAKE 1 TABLET BY MOUTH EVERY DAY 30 tablet 2  . furosemide (LASIX) 20 MG tablet Take 2 tablets (40 mg total) by mouth daily. 90 tablet 3  . LANTUS SOLOSTAR 100 UNIT/ML Solostar Pen Inject 28 Units into the skin every morning.   11  . loratadine (CLARITIN) 10 MG tablet Take 1 tablet (10 mg total) by mouth daily. 30 tablet 1  . memantine (NAMENDA) 10 MG  tablet Take 1 tablet (10 mg total) by mouth 2 (two) times daily. 180 tablet 3  . Multiple Vitamins-Minerals (PRESERVISION AREDS 2) CAPS Take 1 capsule by mouth 2 (two) times daily.    . nitroGLYCERIN (NITRODUR - DOSED IN MG/24 HR) 0.2 mg/hr patch Place 1 patch (0.2 mg total) onto the skin daily. 90 patch 3  . pantoprazole (PROTONIX) 20 MG tablet Take 1 tablet (20 mg total) by mouth daily. 30 tablet 11  . pravastatin (PRAVACHOL) 40 MG tablet TAKE 1 TABLET(40 MG) BY MOUTH EVERY EVENING 30 tablet 11  . PROVENTIL HFA 108 (90 BASE) MCG/ACT inhaler Inhale 2 puffs into the lungs every 6 (six) hours as needed for wheezing or shortness of breath. As directed    . SPIRIVA HANDIHALER 18 MCG inhalation capsule INHALE CONTENTS OF 1 CAPSULE VIA HANDIHALER ONCE DAILY 30 capsule 5  . SYMBICORT 160-4.5 MCG/ACT inhaler INHALE 2 PUFFS BY MOUTH TWICE DAILY 10.2 g 6   No current facility-administered medications for this visit.    Physical Exam: Filed Vitals:   11/06/15 1647  BP: 152/64  Pulse: 74  Height: 5\' 8"  (1.727 m)  Weight: 201 lb 9.6 oz (91.445 kg)    GEN- The patient is well appearing, alert and oriented x 3 today.   Head- normocephalic, atraumatic Eyes-  Sclera clear, conjunctiva pink Ears- hearing intact Oropharynx- clear Lungs- Clear to ausculation bilaterally, normal work of breathing Chest- ICD pocket is well healed Heart- Regular rate and rhythm, no murmurs, rubs or gallops, PMI not laterally displaced GI- soft, NT, ND, + BS Extremities- no clubbing, cyanosis, or edema  ICD interrogation- reviewed in detail today,  See PACEART report  ekg today reveals sinus with IVCD (QRS 126 msec), anteroseptal infarct pattern, LAHB  Assessment and Plan:   1. Chronic systolic dysfunction Stable No change required today Normal ICD function See Pace Art report No changes today I have discussed SJM Fortify Assura advisary with the patient today. He understands that recommendation from SJM is to  not replace the device at this time. The patient is not device dependant.  The patient has not had appropriate device therapy in the past or implanted for secondary prevention.  Vibratory alert demonstrated today.  He is actively remotely monitored and understands the importance of compliance today.   2. CAD No ischemic symptoms Followed by Dr Martinique  Merlin Return to see EP NP in 1 year  Thompson Grayer MD, [facility] 11/06/2015 5:37 PM

## 2015-11-06 NOTE — Patient Instructions (Signed)
Medication Instructions:  Your physician recommends that you continue on your current medications as directed. Please refer to the Current Medication list given to you today.   Labwork: None ordered   Testing/Procedures: None ordered   Follow-Up: Your physician wants you to follow-up in: 12 months with Antonio Marshall, NP You will receive a reminder letter in the mail two months in advance. If you don't receive a letter, please call our office to schedule the follow-up appointment.  Remote monitoring is used to monitor your ICD from home. This monitoring reduces the number of office visits required to check your device to one time per year. It allows [place] to keep an eye on the functioning of your device to ensure it is working properly. You are scheduled for a device check from home on 02/05/16. You may send your transmission at any time that day. If you have a wireless device, the transmission will be sent automatically. After your physician reviews your transmission, you will receive a postcard with your next transmission date.     Any Other Special Instructions Will Be Listed Below (If Applicable).     If you need a refill on your cardiac medications before your next appointment, please call your pharmacy.

## 2015-11-16 DIAGNOSIS — L97511 Non-pressure chronic ulcer of other part of right foot limited to breakdown of skin: Secondary | ICD-10-CM | POA: Diagnosis not present

## 2015-11-16 DIAGNOSIS — E1151 Type 2 diabetes mellitus with diabetic peripheral angiopathy without gangrene: Secondary | ICD-10-CM | POA: Diagnosis not present

## 2015-11-16 DIAGNOSIS — L84 Corns and callosities: Secondary | ICD-10-CM | POA: Diagnosis not present

## 2015-11-16 DIAGNOSIS — I739 Peripheral vascular disease, unspecified: Secondary | ICD-10-CM | POA: Diagnosis not present

## 2015-11-16 DIAGNOSIS — L603 Nail dystrophy: Secondary | ICD-10-CM | POA: Diagnosis not present

## 2015-11-24 ENCOUNTER — Other Ambulatory Visit: Payer: Self-pay | Admitting: Cardiology

## 2015-11-24 NOTE — Telephone Encounter (Signed)
Rx(s) sent to pharmacy electronically.  

## 2016-02-01 DIAGNOSIS — L603 Nail dystrophy: Secondary | ICD-10-CM | POA: Diagnosis not present

## 2016-02-01 DIAGNOSIS — L84 Corns and callosities: Secondary | ICD-10-CM | POA: Diagnosis not present

## 2016-02-01 DIAGNOSIS — E1051 Type 1 diabetes mellitus with diabetic peripheral angiopathy without gangrene: Secondary | ICD-10-CM | POA: Diagnosis not present

## 2016-02-01 DIAGNOSIS — I739 Peripheral vascular disease, unspecified: Secondary | ICD-10-CM | POA: Diagnosis not present

## 2016-02-05 DIAGNOSIS — N400 Enlarged prostate without lower urinary tract symptoms: Secondary | ICD-10-CM | POA: Diagnosis not present

## 2016-02-05 DIAGNOSIS — Z794 Long term (current) use of insulin: Secondary | ICD-10-CM | POA: Diagnosis not present

## 2016-02-05 DIAGNOSIS — E1142 Type 2 diabetes mellitus with diabetic polyneuropathy: Secondary | ICD-10-CM | POA: Diagnosis not present

## 2016-02-05 DIAGNOSIS — E785 Hyperlipidemia, unspecified: Secondary | ICD-10-CM | POA: Diagnosis not present

## 2016-02-05 DIAGNOSIS — I10 Essential (primary) hypertension: Secondary | ICD-10-CM | POA: Diagnosis not present

## 2016-02-08 ENCOUNTER — Ambulatory Visit (INDEPENDENT_AMBULATORY_CARE_PROVIDER_SITE_OTHER): Payer: Medicare Other | Admitting: *Deleted

## 2016-02-08 ENCOUNTER — Telehealth: Payer: Self-pay | Admitting: Cardiology

## 2016-02-08 DIAGNOSIS — I255 Ischemic cardiomyopathy: Secondary | ICD-10-CM | POA: Diagnosis not present

## 2016-02-08 DIAGNOSIS — I5022 Chronic systolic (congestive) heart failure: Secondary | ICD-10-CM | POA: Diagnosis not present

## 2016-02-08 DIAGNOSIS — Z95810 Presence of automatic (implantable) cardiac defibrillator: Secondary | ICD-10-CM | POA: Diagnosis not present

## 2016-02-08 NOTE — Progress Notes (Signed)
Remote ICD transmission.   

## 2016-02-08 NOTE — Telephone Encounter (Signed)
Spoke with pt and reminded pt of remote transmission that is due today. Pt verbalized understanding.   

## 2016-02-10 ENCOUNTER — Encounter: Payer: Self-pay | Admitting: Cardiology

## 2016-02-10 LAB — CUP PACEART REMOTE DEVICE CHECK
HighPow Impedance: 80 Ohm
Implantable Lead Implant Date: 20120829
Implantable Lead Location: 753860
Lead Channel Impedance Value: 430 Ohm
Lead Channel Sensing Intrinsic Amplitude: 1 mV
Lead Channel Setting Pacing Pulse Width: 0.5 ms
MDC IDC LEAD IMPLANT DT: 20120829
MDC IDC LEAD LOCATION: 753859
MDC IDC MSMT LEADCHNL RV IMPEDANCE VALUE: 430 Ohm
MDC IDC MSMT LEADCHNL RV SENSING INTR AMPL: 12 mV
MDC IDC SESS DTM: 20170712095624
MDC IDC SET LEADCHNL RA PACING AMPLITUDE: 2 V
MDC IDC SET LEADCHNL RV PACING AMPLITUDE: 2.5 V
MDC IDC SET LEADCHNL RV SENSING SENSITIVITY: 0.5 mV
MDC IDC STAT BRADY RA PERCENT PACED: 39 %
MDC IDC STAT BRADY RV PERCENT PACED: 1 % — AB
Pulse Gen Serial Number: 828604

## 2016-02-29 ENCOUNTER — Other Ambulatory Visit: Payer: Self-pay | Admitting: Pulmonary Disease

## 2016-03-17 DIAGNOSIS — E119 Type 2 diabetes mellitus without complications: Secondary | ICD-10-CM | POA: Diagnosis not present

## 2016-03-17 DIAGNOSIS — H52223 Regular astigmatism, bilateral: Secondary | ICD-10-CM | POA: Diagnosis not present

## 2016-03-17 DIAGNOSIS — H5212 Myopia, left eye: Secondary | ICD-10-CM | POA: Diagnosis not present

## 2016-03-17 DIAGNOSIS — H5201 Hypermetropia, right eye: Secondary | ICD-10-CM | POA: Diagnosis not present

## 2016-03-17 DIAGNOSIS — H3530 Unspecified macular degeneration: Secondary | ICD-10-CM | POA: Diagnosis not present

## 2016-04-07 DIAGNOSIS — L821 Other seborrheic keratosis: Secondary | ICD-10-CM | POA: Diagnosis not present

## 2016-04-07 DIAGNOSIS — Z85828 Personal history of other malignant neoplasm of skin: Secondary | ICD-10-CM | POA: Diagnosis not present

## 2016-04-07 DIAGNOSIS — L570 Actinic keratosis: Secondary | ICD-10-CM | POA: Diagnosis not present

## 2016-04-07 DIAGNOSIS — D485 Neoplasm of uncertain behavior of skin: Secondary | ICD-10-CM | POA: Diagnosis not present

## 2016-04-07 DIAGNOSIS — D225 Melanocytic nevi of trunk: Secondary | ICD-10-CM | POA: Diagnosis not present

## 2016-04-07 DIAGNOSIS — D044 Carcinoma in situ of skin of scalp and neck: Secondary | ICD-10-CM | POA: Diagnosis not present

## 2016-04-11 ENCOUNTER — Ambulatory Visit (INDEPENDENT_AMBULATORY_CARE_PROVIDER_SITE_OTHER): Payer: Medicare Other | Admitting: Pulmonary Disease

## 2016-04-11 ENCOUNTER — Encounter: Payer: Self-pay | Admitting: Pulmonary Disease

## 2016-04-11 DIAGNOSIS — J301 Allergic rhinitis due to pollen: Secondary | ICD-10-CM

## 2016-04-11 DIAGNOSIS — Z23 Encounter for immunization: Secondary | ICD-10-CM

## 2016-04-11 DIAGNOSIS — J449 Chronic obstructive pulmonary disease, unspecified: Secondary | ICD-10-CM

## 2016-04-11 DIAGNOSIS — I255 Ischemic cardiomyopathy: Secondary | ICD-10-CM

## 2016-04-11 DIAGNOSIS — J309 Allergic rhinitis, unspecified: Secondary | ICD-10-CM | POA: Insufficient documentation

## 2016-04-11 NOTE — Patient Instructions (Signed)
Use Milta Deiters med rinses twice a day to help with the sinus congestion Keep taking your Symbicort and Spiriva as you are doing I will see you back in 6 months or sooner if needed

## 2016-04-11 NOTE — Progress Notes (Signed)
Subjective:    Patient ID: Antonio Ray, male    DOB: [birth year], 82 y.o.   MRN: [id number]  Synopsis: First the [facility] pulmonary clinic in 2015 for severe COPD. His FEV1 at that time was 29% predicted. He also has a scar in his right lung base believed to be related to pneumonia. He also has a history of achalasia and was admitted for healthcare associated pneumonia multiple times in 2016. He underwent Botox injections in his distal esophagus in late 2016.  HPI  Chief Complaint  Patient presents with  . Follow-up    pt c/o sinus congestion, PND, prod cough with yellow mucus X5 days.     Antonio Ray has been walking more lately.  He has been walking 1.5 miles twice a day and feels better after doing that.  He has not had another round of pneumonia.  He has not had trouble swallowing.  However he will get a clear mucus build up in the back of his throat when he drinks sugary beverages will cause a mucus build up in his throat.  He can't drink milk anymore lately.    He continues to take Spiriva and Symbicort > taking them regularly.   He has been having more sinus congestion with a mild increase in mucus production.    Past Medical History:  Diagnosis Date  . Achalasia   . AICD (automatic cardioverter/defibrillator) present 03/30/2011   Analyze ST study patient  . Anginal pain ([place])   . BPH (benign prostatic hyperplasia)   . CHF (congestive heart failure) ([place])   . Chronic systolic dysfunction of left ventricle    EF 30-35%, CLASS II - III SYMPTOMS; intolerant to Coreg  . CKD (chronic kidney disease) stage 3, GFR 30-59 ml/min 06/08/2012  . COPD (chronic obstructive pulmonary disease) ([place])   . Coronary artery disease    History of remote anterior MI with PCI to LAD in 2006; most recent cath 2007, no intervention required  . DOE (dyspnea on exertion)    with heavy exertion  . Dysrhythmia   . Esophageal dysmotility   . GERD (gastroesophageal reflux disease)   . Head injury,  closed, with concussion ([place]) 2000ish  . Heart murmur    hx  . HOH (hard of hearing)   . Hyperlipidemia   . Memory loss    improved  . Myocardial infarction ([place]) 1994   ANTERIOR  . Pneumonia "several times"   aspiration pna with at least 3 admits for this 2016.   Marland Kitchen PVC's (premature ventricular contractions)   . Renal failure   . Skin cancer "several"   "forearms; head"  . Type II diabetes mellitus ([place])      Review of Systems     Objective:   Physical Exam  Vitals:   04/11/16 1410  BP: 126/66  BP Location: Left Arm  Cuff Size: Normal  Pulse: 63  SpO2: 95%  Weight: 203 lb 3.2 oz (92.2 kg)  Height: 5\' 8"  (1.727 m)  Ra  Gen: well appearing, no acute distress HEENT: NCAT, EOMi, OP clear PULM: Few crackles right base but no wheezing, normal air movement today CV: RRR, no mgr, no JVD AB: BS+, soft, nontender, no hsm Ext: warm, no edema, no clubbing, no cyanosis Derm: no rash or skin breakdown  Records from his recent balloon dilation of the esophagus reviewed  December 2016 chest x-ray without evidence of pneumonia     Assessment & Plan:   Allergic rhinitis This has been  flaring up and more of a problem for him recently. I think it's likely due to ragweed.  Plan: Continue Claritin We talked about using nasal steroid such as Nasacort or Flonase, he will think about using one of those I advised Milta Deiters med saline rinses for more immediate relief  COPD GOLD IV This has been a remarkably stable interval for Antonio Ray. He is walking is much as 3 miles a day despite his severe airflow obstruction.  Plan: Flu shot today Continue Symbicort and Spiriva Follow-up 6 months    Updated Medication List Outpatient Encounter Prescriptions as of 04/11/2016  Medication Sig  . aspirin 81 MG EC tablet Take 81 mg by mouth daily.    . Cholecalciferol (VITAMIN D PO) Take 2,000 Units by mouth daily.   Marland Kitchen donepezil (ARICEPT) 10 MG tablet Take 1 tablet (10 mg total) by mouth  every morning.  . fenofibrate (TRICOR) 145 MG tablet TAKE 1 TABLET BY MOUTH EVERY DAY  . furosemide (LASIX) 20 MG tablet Take 2 tablets (40 mg total) by mouth daily.  Marland Kitchen LANTUS SOLOSTAR 100 UNIT/ML Solostar Pen Inject 28 Units into the skin every morning.   . loratadine (CLARITIN) 10 MG tablet Take 1 tablet (10 mg total) by mouth daily.  . memantine (NAMENDA) 10 MG tablet Take 1 tablet (10 mg total) by mouth 2 (two) times daily.  . Multiple Vitamins-Minerals (PRESERVISION AREDS 2) CAPS Take 1 capsule by mouth 2 (two) times daily.  . nitroGLYCERIN (NITRODUR - DOSED IN MG/24 HR) 0.2 mg/hr patch Place 1 patch (0.2 mg total) onto the skin daily.  . pantoprazole (PROTONIX) 20 MG tablet Take 1 tablet (20 mg total) by mouth daily.  . pravastatin (PRAVACHOL) 40 MG tablet TAKE 1 TABLET(40 MG) BY MOUTH EVERY EVENING  . PROVENTIL HFA 108 (90 BASE) MCG/ACT inhaler Inhale 2 puffs into the lungs every 6 (six) hours as needed for wheezing or shortness of breath. As directed  . SPIRIVA HANDIHALER 18 MCG inhalation capsule INHALE CONTENTS OF 1 CAPSULE VIA HANDIHALER ONCE DAILY  . SYMBICORT 160-4.5 MCG/ACT inhaler INHALE 2 PUFFS BY MOUTH TWICE DAILY  . [DISCONTINUED] furosemide (LASIX) 20 MG tablet Take 2 tablets (40 mg total) by mouth daily.  . [DISCONTINUED] pantoprazole (PROTONIX) 20 MG tablet TAKE 1 TABLET BY MOUTH DAILY   No facility-administered encounter medications on file as of 04/11/2016.

## 2016-04-11 NOTE — Assessment & Plan Note (Signed)
This has been flaring up and more of a problem for him recently. I think it's likely due to ragweed.  Plan: Continue Claritin We talked about using nasal steroid such as Nasacort or Flonase, he will think about using one of those I advised Milta Deiters med saline rinses for more immediate relief

## 2016-04-11 NOTE — Assessment & Plan Note (Signed)
This has been a remarkably stable interval for Antonio Ray. He is walking is much as 3 miles a day despite his severe airflow obstruction.  Plan: Flu shot today Continue Symbicort and Spiriva Follow-up 6 months

## 2016-04-13 ENCOUNTER — Encounter: Payer: Self-pay | Admitting: Pulmonary Disease

## 2016-04-13 DIAGNOSIS — J441 Chronic obstructive pulmonary disease with (acute) exacerbation: Secondary | ICD-10-CM | POA: Diagnosis not present

## 2016-04-21 ENCOUNTER — Encounter (HOSPITAL_COMMUNITY): Payer: Self-pay | Admitting: Emergency Medicine

## 2016-04-21 ENCOUNTER — Inpatient Hospital Stay (HOSPITAL_COMMUNITY)
Admission: EM | Admit: 2016-04-21 | Discharge: 2016-04-22 | DRG: 189 | Disposition: A | Discharge status: Home / Self Care | Payer: Medicare Other | Attending: Internal Medicine | Admitting: Internal Medicine

## 2016-04-21 ENCOUNTER — Telehealth: Payer: Self-pay | Admitting: Pulmonary Disease

## 2016-04-21 ENCOUNTER — Emergency Department (HOSPITAL_COMMUNITY): Payer: Medicare Other

## 2016-04-21 DIAGNOSIS — Z9842 Cataract extraction status, left eye: Secondary | ICD-10-CM

## 2016-04-21 DIAGNOSIS — J441 Chronic obstructive pulmonary disease with (acute) exacerbation: Secondary | ICD-10-CM

## 2016-04-21 DIAGNOSIS — I2510 Atherosclerotic heart disease of native coronary artery without angina pectoris: Secondary | ICD-10-CM | POA: Diagnosis present

## 2016-04-21 DIAGNOSIS — Z9861 Coronary angioplasty status: Secondary | ICD-10-CM

## 2016-04-21 DIAGNOSIS — I252 Old myocardial infarction: Secondary | ICD-10-CM | POA: Diagnosis not present

## 2016-04-21 DIAGNOSIS — R413 Other amnesia: Secondary | ICD-10-CM | POA: Diagnosis present

## 2016-04-21 DIAGNOSIS — I5043 Acute on chronic combined systolic (congestive) and diastolic (congestive) heart failure: Secondary | ICD-10-CM | POA: Diagnosis present

## 2016-04-21 DIAGNOSIS — N189 Chronic kidney disease, unspecified: Secondary | ICD-10-CM

## 2016-04-21 DIAGNOSIS — K219 Gastro-esophageal reflux disease without esophagitis: Secondary | ICD-10-CM | POA: Diagnosis present

## 2016-04-21 DIAGNOSIS — A419 Sepsis, unspecified organism: Secondary | ICD-10-CM

## 2016-04-21 DIAGNOSIS — H9190 Unspecified hearing loss, unspecified ear: Secondary | ICD-10-CM | POA: Diagnosis present

## 2016-04-21 DIAGNOSIS — Z961 Presence of intraocular lens: Secondary | ICD-10-CM | POA: Diagnosis present

## 2016-04-21 DIAGNOSIS — N400 Enlarged prostate without lower urinary tract symptoms: Secondary | ICD-10-CM | POA: Diagnosis present

## 2016-04-21 DIAGNOSIS — Z87891 Personal history of nicotine dependence: Secondary | ICD-10-CM | POA: Diagnosis not present

## 2016-04-21 DIAGNOSIS — I517 Cardiomegaly: Secondary | ICD-10-CM | POA: Diagnosis present

## 2016-04-21 DIAGNOSIS — R739 Hyperglycemia, unspecified: Secondary | ICD-10-CM

## 2016-04-21 DIAGNOSIS — Z9841 Cataract extraction status, right eye: Secondary | ICD-10-CM | POA: Diagnosis not present

## 2016-04-21 DIAGNOSIS — E1122 Type 2 diabetes mellitus with diabetic chronic kidney disease: Secondary | ICD-10-CM | POA: Diagnosis present

## 2016-04-21 DIAGNOSIS — R069 Unspecified abnormalities of breathing: Secondary | ICD-10-CM | POA: Diagnosis not present

## 2016-04-21 DIAGNOSIS — R0902 Hypoxemia: Secondary | ICD-10-CM

## 2016-04-21 DIAGNOSIS — Z95810 Presence of automatic (implantable) cardiac defibrillator: Secondary | ICD-10-CM | POA: Diagnosis not present

## 2016-04-21 DIAGNOSIS — N183 Chronic kidney disease, stage 3 unspecified: Secondary | ICD-10-CM | POA: Diagnosis present

## 2016-04-21 DIAGNOSIS — J9621 Acute and chronic respiratory failure with hypoxia: Principal | ICD-10-CM | POA: Diagnosis present

## 2016-04-21 DIAGNOSIS — R0602 Shortness of breath: Secondary | ICD-10-CM | POA: Diagnosis not present

## 2016-04-21 DIAGNOSIS — I493 Ventricular premature depolarization: Secondary | ICD-10-CM | POA: Diagnosis present

## 2016-04-21 DIAGNOSIS — I5042 Chronic combined systolic (congestive) and diastolic (congestive) heart failure: Secondary | ICD-10-CM

## 2016-04-21 DIAGNOSIS — I509 Heart failure, unspecified: Secondary | ICD-10-CM | POA: Diagnosis not present

## 2016-04-21 DIAGNOSIS — J209 Acute bronchitis, unspecified: Secondary | ICD-10-CM | POA: Diagnosis present

## 2016-04-21 DIAGNOSIS — J9601 Acute respiratory failure with hypoxia: Secondary | ICD-10-CM | POA: Diagnosis present

## 2016-04-21 DIAGNOSIS — Z7982 Long term (current) use of aspirin: Secondary | ICD-10-CM | POA: Diagnosis not present

## 2016-04-21 DIAGNOSIS — Z794 Long term (current) use of insulin: Secondary | ICD-10-CM

## 2016-04-21 DIAGNOSIS — J9600 Acute respiratory failure, unspecified whether with hypoxia or hypercapnia: Secondary | ICD-10-CM | POA: Diagnosis present

## 2016-04-21 DIAGNOSIS — J9690 Respiratory failure, unspecified, unspecified whether with hypoxia or hypercapnia: Secondary | ICD-10-CM | POA: Diagnosis not present

## 2016-04-21 DIAGNOSIS — E1165 Type 2 diabetes mellitus with hyperglycemia: Secondary | ICD-10-CM | POA: Diagnosis present

## 2016-04-21 DIAGNOSIS — E785 Hyperlipidemia, unspecified: Secondary | ICD-10-CM | POA: Diagnosis present

## 2016-04-21 DIAGNOSIS — R6510 Systemic inflammatory response syndrome (SIRS) of non-infectious origin without acute organ dysfunction: Secondary | ICD-10-CM

## 2016-04-21 DIAGNOSIS — R42 Dizziness and giddiness: Secondary | ICD-10-CM | POA: Diagnosis not present

## 2016-04-21 DIAGNOSIS — M7989 Other specified soft tissue disorders: Secondary | ICD-10-CM | POA: Diagnosis not present

## 2016-04-21 LAB — CBC WITH DIFFERENTIAL/PLATELET
Basophils Absolute: 0 10*3/uL (ref 0.0–0.1)
Basophils Relative: 0 %
EOS ABS: 0 10*3/uL (ref 0.0–0.7)
Eosinophils Relative: 0 %
HEMATOCRIT: 49.1 % (ref 39.0–52.0)
HEMOGLOBIN: 15.3 g/dL (ref 13.0–17.0)
LYMPHS ABS: 3.2 10*3/uL (ref 0.7–4.0)
LYMPHS PCT: 22 %
MCH: 27.5 pg (ref 26.0–34.0)
MCHC: 31.2 g/dL (ref 30.0–36.0)
MCV: 88.2 fL (ref 78.0–100.0)
Monocytes Absolute: 1.5 10*3/uL — ABNORMAL HIGH (ref 0.1–1.0)
Monocytes Relative: 11 %
NEUTROS ABS: 9.5 10*3/uL — AB (ref 1.7–7.7)
NEUTROS PCT: 67 %
Platelets: 197 10*3/uL (ref 150–400)
RBC: 5.57 MIL/uL (ref 4.22–5.81)
RDW: 14.8 % (ref 11.5–15.5)
WBC: 14.2 10*3/uL — AB (ref 4.0–10.5)

## 2016-04-21 LAB — COMPREHENSIVE METABOLIC PANEL
ALT: 23 U/L (ref 17–63)
ANION GAP: 14 (ref 5–15)
AST: 27 U/L (ref 15–41)
Albumin: 3.2 g/dL — ABNORMAL LOW (ref 3.5–5.0)
Alkaline Phosphatase: 46 U/L (ref 38–126)
BUN: 49 mg/dL — ABNORMAL HIGH (ref 6–20)
CHLORIDE: 104 mmol/L (ref 101–111)
CO2: 21 mmol/L — ABNORMAL LOW (ref 22–32)
Calcium: 9.1 mg/dL (ref 8.9–10.3)
Creatinine, Ser: 1.87 mg/dL — ABNORMAL HIGH (ref 0.61–1.24)
GFR, EST AFRICAN AMERICAN: 37 mL/min — AB (ref >60)
GFR, EST NON AFRICAN AMERICAN: 32 mL/min — AB (ref >60)
Glucose, Bld: 242 mg/dL — ABNORMAL HIGH (ref 65–99)
POTASSIUM: 4.7 mmol/L (ref 3.5–5.1)
SODIUM: 139 mmol/L (ref 135–145)
Total Bilirubin: 0.7 mg/dL (ref 0.3–1.2)
Total Protein: 6.4 g/dL — ABNORMAL LOW (ref 6.5–8.1)

## 2016-04-21 LAB — TROPONIN I
TROPONIN I: 0.03 ng/mL — AB (ref <0.03)
TROPONIN I: 0.04 ng/mL — AB (ref <0.03)
TROPONIN I: 0.04 ng/mL — AB (ref <0.03)

## 2016-04-21 LAB — BASIC METABOLIC PANEL
ANION GAP: 8 (ref 5–15)
BUN: 49 mg/dL — ABNORMAL HIGH (ref 6–20)
CHLORIDE: 102 mmol/L (ref 101–111)
CO2: 24 mmol/L (ref 22–32)
Calcium: 8.5 mg/dL — ABNORMAL LOW (ref 8.9–10.3)
Creatinine, Ser: 2.02 mg/dL — ABNORMAL HIGH (ref 0.61–1.24)
GFR, EST AFRICAN AMERICAN: 34 mL/min — AB (ref >60)
GFR, EST NON AFRICAN AMERICAN: 29 mL/min — AB (ref >60)
Glucose, Bld: 583 mg/dL (ref 65–99)
POTASSIUM: 4.7 mmol/L (ref 3.5–5.1)
SODIUM: 134 mmol/L — AB (ref 135–145)

## 2016-04-21 LAB — LACTIC ACID, PLASMA
LACTIC ACID, VENOUS: 3.4 mmol/L — AB (ref 0.5–1.9)
Lactic Acid, Venous: 2.1 mmol/L (ref 0.5–1.9)
Lactic Acid, Venous: 2.7 mmol/L (ref 0.5–1.9)

## 2016-04-21 LAB — CREATININE, SERUM
CREATININE: 2.16 mg/dL — AB (ref 0.61–1.24)
GFR calc non Af Amer: 27 mL/min — ABNORMAL LOW (ref >60)
GFR, EST AFRICAN AMERICAN: 31 mL/min — AB (ref >60)

## 2016-04-21 LAB — BRAIN NATRIURETIC PEPTIDE: B NATRIURETIC PEPTIDE 5: 154.2 pg/mL — AB (ref 0.0–100.0)

## 2016-04-21 LAB — D-DIMER, QUANTITATIVE: D-Dimer, Quant: 0.36 ug/mL-FEU (ref 0.00–0.50)

## 2016-04-21 LAB — GLUCOSE, CAPILLARY
GLUCOSE-CAPILLARY: 546 mg/dL — AB (ref 65–99)
Glucose-Capillary: 576 mg/dL (ref 65–99)

## 2016-04-21 LAB — PROCALCITONIN: Procalcitonin: 0.1 ng/mL

## 2016-04-21 LAB — CBG MONITORING, ED: Glucose-Capillary: 464 mg/dL — ABNORMAL HIGH (ref 65–99)

## 2016-04-21 MED ORDER — TIOTROPIUM BROMIDE MONOHYDRATE 18 MCG IN CAPS
18.0000 ug | ORAL_CAPSULE | Freq: Every day | RESPIRATORY_TRACT | Status: DC
  Administered 2016-04-22: 18 ug via RESPIRATORY_TRACT
  Filled 2016-04-21: qty 5

## 2016-04-21 MED ORDER — SODIUM CHLORIDE 0.9 % IV SOLN: INTRAVENOUS | Status: DC

## 2016-04-21 MED ORDER — METHYLPREDNISOLONE SODIUM SUCC 125 MG IJ SOLR
125.0000 mg | Freq: Three times a day (TID) | INTRAMUSCULAR | Status: DC
  Filled 2016-04-21: qty 2

## 2016-04-21 MED ORDER — AZITHROMYCIN 250 MG PO TABS
500.0000 mg | ORAL_TABLET | Freq: Once | ORAL | Status: AC
  Administered 2016-04-21: 500 mg via ORAL
  Filled 2016-04-21: qty 2

## 2016-04-21 MED ORDER — SODIUM CHLORIDE 0.9 % IV BOLUS (SEPSIS)
1000.0000 mL | Freq: Once | INTRAVENOUS | Status: AC
  Administered 2016-04-21: 1000 mL via INTRAVENOUS

## 2016-04-21 MED ORDER — ALBUTEROL SULFATE (2.5 MG/3ML) 0.083% IN NEBU: 2.5000 mg | INHALATION_SOLUTION | RESPIRATORY_TRACT | Status: DC | PRN

## 2016-04-21 MED ORDER — MOMETASONE FURO-FORMOTEROL FUM 200-5 MCG/ACT IN AERO: 2.0000 | INHALATION_SPRAY | Freq: Two times a day (BID) | RESPIRATORY_TRACT | Status: DC

## 2016-04-21 MED ORDER — METHYLPREDNISOLONE SODIUM SUCC 125 MG IJ SOLR
125.0000 mg | Freq: Three times a day (TID) | INTRAMUSCULAR | Status: DC
  Administered 2016-04-21 – 2016-04-22 (×2): 125 mg via INTRAVENOUS
  Filled 2016-04-21 (×2): qty 2

## 2016-04-21 MED ORDER — INSULIN ASPART 100 UNIT/ML ~~LOC~~ SOLN
0.0000 [IU] | Freq: Three times a day (TID) | SUBCUTANEOUS | Status: DC
  Administered 2016-04-21: 12 [IU] via SUBCUTANEOUS
  Filled 2016-04-21: qty 1

## 2016-04-21 MED ORDER — ASPIRIN EC 81 MG PO TBEC
81.0000 mg | DELAYED_RELEASE_TABLET | Freq: Every day | ORAL | Status: DC
  Administered 2016-04-22: 81 mg via ORAL
  Filled 2016-04-21: qty 1

## 2016-04-21 MED ORDER — FENOFIBRATE 160 MG PO TABS
160.0000 mg | ORAL_TABLET | Freq: Every day | ORAL | Status: DC
  Administered 2016-04-21 – 2016-04-22 (×2): 160 mg via ORAL
  Filled 2016-04-21 (×2): qty 1

## 2016-04-21 MED ORDER — METHYLPREDNISOLONE SODIUM SUCC 125 MG IJ SOLR
125.0000 mg | Freq: Three times a day (TID) | INTRAMUSCULAR | Status: DC
Start: 2016-04-22 — End: 2016-04-21

## 2016-04-21 MED ORDER — INSULIN GLARGINE 100 UNIT/ML ~~LOC~~ SOLN
37.0000 [IU] | SUBCUTANEOUS | Status: DC
  Administered 2016-04-22: 37 [IU] via SUBCUTANEOUS
  Filled 2016-04-21 (×2): qty 0.37

## 2016-04-21 MED ORDER — PRAVASTATIN SODIUM 40 MG PO TABS
40.0000 mg | ORAL_TABLET | Freq: Every day | ORAL | Status: DC
  Administered 2016-04-22: 40 mg via ORAL
  Filled 2016-04-21: qty 1

## 2016-04-21 MED ORDER — INSULIN ASPART 100 UNIT/ML ~~LOC~~ SOLN
0.0000 [IU] | Freq: Three times a day (TID) | SUBCUTANEOUS | Status: DC
  Administered 2016-04-22: 4 [IU] via SUBCUTANEOUS
  Administered 2016-04-22: 7 [IU] via SUBCUTANEOUS

## 2016-04-21 MED ORDER — METHYLPREDNISOLONE SODIUM SUCC 125 MG IJ SOLR
125.0000 mg | Freq: Once | INTRAMUSCULAR | Status: AC
Start: 2016-04-21 — End: 2016-04-21
  Administered 2016-04-21: 125 mg via INTRAVENOUS
  Filled 2016-04-21: qty 2

## 2016-04-21 MED ORDER — IPRATROPIUM-ALBUTEROL 0.5-2.5 (3) MG/3ML IN SOLN
3.0000 mL | RESPIRATORY_TRACT | Status: DC
  Administered 2016-04-21 (×2): 3 mL via RESPIRATORY_TRACT
  Filled 2016-04-21 (×2): qty 3

## 2016-04-21 MED ORDER — INSULIN GLARGINE 100 UNIT/ML ~~LOC~~ SOLN
12.0000 [IU] | Freq: Once | SUBCUTANEOUS | Status: AC
  Administered 2016-04-21: 12 [IU] via SUBCUTANEOUS
  Filled 2016-04-21: qty 0.12

## 2016-04-21 MED ORDER — INSULIN ASPART 100 UNIT/ML ~~LOC~~ SOLN: 0.0000 [IU] | Freq: Every day | SUBCUTANEOUS | Status: DC

## 2016-04-21 MED ORDER — ALBUTEROL (5 MG/ML) CONTINUOUS INHALATION SOLN
10.0000 mg/h | INHALATION_SOLUTION | Freq: Once | RESPIRATORY_TRACT | Status: AC
  Administered 2016-04-21: 10 mg/h via RESPIRATORY_TRACT
  Filled 2016-04-21: qty 20

## 2016-04-21 MED ORDER — GUAIFENESIN ER 600 MG PO TB12
1200.0000 mg | ORAL_TABLET | Freq: Two times a day (BID) | ORAL | Status: DC
  Administered 2016-04-21 – 2016-04-22 (×2): 1200 mg via ORAL
  Filled 2016-04-21 (×2): qty 2

## 2016-04-21 MED ORDER — LORATADINE 10 MG PO TABS
10.0000 mg | ORAL_TABLET | Freq: Every day | ORAL | Status: DC
  Administered 2016-04-22: 10 mg via ORAL
  Filled 2016-04-21: qty 1

## 2016-04-21 MED ORDER — PANTOPRAZOLE SODIUM 20 MG PO TBEC
20.0000 mg | DELAYED_RELEASE_TABLET | Freq: Every day | ORAL | Status: DC
  Administered 2016-04-21 – 2016-04-22 (×2): 20 mg via ORAL
  Filled 2016-04-21 (×2): qty 1

## 2016-04-21 MED ORDER — DONEPEZIL HCL 10 MG PO TABS
10.0000 mg | ORAL_TABLET | Freq: Every morning | ORAL | Status: DC
  Administered 2016-04-22: 10 mg via ORAL
  Filled 2016-04-21: qty 1

## 2016-04-21 MED ORDER — PROMETHAZINE HCL 25 MG PO TABS: 12.5000 mg | ORAL_TABLET | Freq: Four times a day (QID) | ORAL | Status: DC | PRN

## 2016-04-21 MED ORDER — DEXTROSE 5 % IV SOLN
1.0000 g | Freq: Once | INTRAVENOUS | Status: AC
  Administered 2016-04-21: 1 g via INTRAVENOUS
  Filled 2016-04-21: qty 10

## 2016-04-21 MED ORDER — HEPARIN SODIUM (PORCINE) 5000 UNIT/ML IJ SOLN
5000.0000 [IU] | Freq: Three times a day (TID) | INTRAMUSCULAR | Status: DC
  Administered 2016-04-21 – 2016-04-22 (×3): 5000 [IU] via SUBCUTANEOUS
  Filled 2016-04-21 (×2): qty 1

## 2016-04-21 MED ORDER — INSULIN GLARGINE 100 UNIT/ML SOLOSTAR PEN: 37.0000 [IU] | PEN_INJECTOR | SUBCUTANEOUS | Status: DC

## 2016-04-21 MED ORDER — INSULIN ASPART 100 UNIT/ML ~~LOC~~ SOLN
14.0000 [IU] | Freq: Once | SUBCUTANEOUS | Status: AC
  Administered 2016-04-21: 14 [IU] via SUBCUTANEOUS

## 2016-04-21 MED ORDER — INSULIN ASPART 100 UNIT/ML ~~LOC~~ SOLN: 20.0000 [IU] | Freq: Once | SUBCUTANEOUS | Status: DC

## 2016-04-21 MED ORDER — MEMANTINE HCL 10 MG PO TABS
10.0000 mg | ORAL_TABLET | Freq: Two times a day (BID) | ORAL | Status: DC
  Administered 2016-04-21 – 2016-04-22 (×2): 10 mg via ORAL
  Filled 2016-04-21 (×2): qty 1

## 2016-04-21 MED ORDER — IPRATROPIUM-ALBUTEROL 0.5-2.5 (3) MG/3ML IN SOLN
3.0000 mL | Freq: Four times a day (QID) | RESPIRATORY_TRACT | Status: DC
  Administered 2016-04-22 (×2): 3 mL via RESPIRATORY_TRACT
  Filled 2016-04-21 (×2): qty 3

## 2016-04-21 MED ORDER — SODIUM CHLORIDE 0.9% FLUSH
3.0000 mL | Freq: Two times a day (BID) | INTRAVENOUS | Status: DC
  Administered 2016-04-21 – 2016-04-22 (×2): 3 mL via INTRAVENOUS

## 2016-04-21 MED ORDER — DEXTROSE 5 % IV SOLN
500.0000 mg | INTRAVENOUS | Status: DC
  Filled 2016-04-21: qty 500

## 2016-04-21 MED ORDER — BUDESONIDE 0.5 MG/2ML IN SUSP
0.5000 mg | Freq: Two times a day (BID) | RESPIRATORY_TRACT | Status: DC
  Administered 2016-04-21 – 2016-04-22 (×2): 0.5 mg via RESPIRATORY_TRACT
  Filled 2016-04-21 (×2): qty 2

## 2016-04-21 MED ORDER — ARFORMOTEROL TARTRATE 15 MCG/2ML IN NEBU
15.0000 ug | INHALATION_SOLUTION | Freq: Two times a day (BID) | RESPIRATORY_TRACT | Status: DC
  Administered 2016-04-21 – 2016-04-22 (×2): 15 ug via RESPIRATORY_TRACT
  Filled 2016-04-21 (×2): qty 2

## 2016-04-21 MED ORDER — DEXTROSE 5 % IV SOLN
1.0000 g | INTRAVENOUS | Status: DC
  Filled 2016-04-21: qty 10

## 2016-04-21 NOTE — ED Notes (Signed)
Patient to receive 12 units for CBG 464 per Dr Marily Memos.

## 2016-04-21 NOTE — H&P (Addendum)
History and Physical    Antonio Ray:[id number] DOB: Dec 17, 1933 DOA: 04/21/2016  PCP: [name], MD Patient coming from: Home  Chief Complaint: Shortness of breath, cough, wheezing  HPI: Antonio Ray is a 82 y.o. male with medical history significant of COPD, achalasia, BPH, CHF, CKD, COPD, esophageal dysmotility, GERD,  HLD, memory loss, MI, PVC, DM, s/p SICD placement presenting after failed outpt therapy for COPD exacerbation. Cough, wheezing and shortness of breath started on 04/11/2016 after point with his pulmonologist. On 913 patient was started on amoxicillin, Mucinex and steroids. Initially improved but 1 day ago with symptoms worsen significantly again. Inhalers with only slight intermittent improvement. Mild confusion this morning which quickly resolved.   ED Course: Ambulatory O2 saturation levels less than 90% on room air. Objective findings outlined below. Placed on O2. Given Rocephin and azithromycin and 125 mg of Solu-Medrol.  Review of Systems: As per HPI otherwise 10 point review of systems negative.   Ambulatory Status: Restricted only by pulmonary status  Past Medical History:  Diagnosis Date  . Achalasia   . AICD (automatic cardioverter/defibrillator) present 03/30/2011   Analyze ST study patient  . Anginal pain ([place])   . BPH (benign prostatic hyperplasia)   . CHF (congestive heart failure) ([place])   . Chronic systolic dysfunction of left ventricle    EF 30-35%, CLASS II - III SYMPTOMS; intolerant to Coreg  . CKD (chronic kidney disease) stage 3, GFR 30-59 ml/min 06/08/2012  . COPD (chronic obstructive pulmonary disease) ([place])   . Coronary artery disease    History of remote anterior MI with PCI to LAD in 2006; most recent cath 2007, no intervention required  . DOE (dyspnea on exertion)    with heavy exertion  . Dysrhythmia   . Esophageal dysmotility   . GERD (gastroesophageal reflux disease)   . Head injury, closed, with concussion ([place]) 2000ish  .  Heart murmur    hx  . HOH (hard of hearing)   . Hyperlipidemia   . Memory loss    improved  . Myocardial infarction ([place]) 1994   ANTERIOR  . Pneumonia "several times"   aspiration pna with at least 3 admits for this 2016.   Marland Kitchen PVC's (premature ventricular contractions)   . Renal failure   . Skin cancer "several"   "forearms; head"  . Type II diabetes mellitus ([place])     Past Surgical History:  Procedure Laterality Date  . BALLOON DILATION N/A 09/09/2015   Procedure: BALLOON DILATION;  Surgeon: Mauri Pole, MD;  Location: [facility];  Service: Endoscopy;  Laterality: N/A;  rigiflex achalasia balloon dilators  . BOTOX INJECTION N/A 04/08/2015   Procedure: BOTOX INJECTION;  Surgeon: Milus Banister, MD;  Location: [facility];  Service: Endoscopy;  Laterality: N/A;  . CARDIAC CATHETERIZATION  01/11/2006   DEMONSTRATES AKINESIA OF THE DISTAL ANTERIOR WALL, DISTAL INFERIOR WALL AND AKINESIA OF THE APEX. THE BASAL SEGMENTS CONTRACT WELL AND OVERALL EF 35%  . CATARACT EXTRACTION W/ INTRAOCULAR LENS  IMPLANT, BILATERAL  08/2014-09/2014  . CHOLECYSTECTOMY  2/11  . CORONARY ANGIOPLASTY  1994   TO THE LAD  . ESOPHAGEAL MANOMETRY N/A 03/16/2015   Procedure: ESOPHAGEAL MANOMETRY (EM);  Surgeon: Jerene Bears, MD;  Location: [facility];  Service: Gastroenterology;  Laterality: N/A;  . ESOPHAGOGASTRODUODENOSCOPY N/A 04/08/2015   Procedure: ESOPHAGOGASTRODUODENOSCOPY (EGD);  Surgeon: Milus Banister, MD;  Location: [facility];  Service: Endoscopy;  Laterality: N/A;  . ESOPHAGOGASTRODUODENOSCOPY (EGD) WITH PROPOFOL N/A 09/09/2015  Procedure: ESOPHAGOGASTRODUODENOSCOPY (EGD) WITH PROPOFOL;  Surgeon: Mauri Pole, MD;  Location: [facility];  Service: Endoscopy;  Laterality: N/A;  pt. is to have gastrografin xray post recovery-do not discharge until results are back  . FOOT FRACTURE SURGERY Right 1980's  . INSERT / REPLACE / REMOVE PACEMAKER    . SKIN CANCER EXCISION  "several"   "forearms,  head"    Social History   Social History  . Marital status: Widowed    Spouse name: N/A  . Number of children: 2  . Years of education: GED   Occupational History  . 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 Smoking status: Former Smoker    Packs/day: 1.00    Years: 35.00    Types: Cigarettes    Quit date: 08/01/1992  . Smokeless tobacco: Never Used  . Alcohol use 0.0 oz/week     Comment: Approximately a beer every 2 years  . Drug use: No  . Sexual activity: Not Currently   Other Topics Concern  . Not on file   Social History Narrative   Pt lives in [place] alone.  Widowed.   Right-handed.   Rare caffeine use - maybe one cup per month.    Allergies  Allergen Reactions  . Codeine Other (See Comments)    Gets very angry, disoriented  . Dilaudid [Hydromorphone Hcl] Other (See Comments)    VERY AGITATED, HOSTILE  . Flomax [Tamsulosin Hcl] Shortness Of Breath  . Morphine And Related Other (See Comments)    VERY AGITATED, HOSTILE  . Sulfa Antibiotics Shortness Of Breath  . Beta Adrenergic Blockers Other (See Comments)    ? disorientation  . Carvedilol Other (See Comments)    DISORIENTATION    Family History  Problem Relation Age of Onset  . Stroke Father     Mother history unknown - never met her  . Colon cancer Neg Hx     Prior to Admission medications   Medication Sig Start Date End Date Taking? Authorizing Provider  amoxicillin (AMOXIL) 875 MG tablet Take 875 mg by mouth 2 (two) times daily.  04/18/16 04/22/16 Yes Historical Provider, MD  aspirin 81 MG EC tablet Take 81 mg by mouth daily.     Yes Historical Provider, MD  Cholecalciferol (VITAMIN D PO) Take 2,000 Units by mouth daily.    Yes Historical Provider, MD  donepezil (ARICEPT) 10 MG tablet Take 1 tablet (10 mg total) by mouth every morning. 08/18/15  Yes Marcial Pacas, MD  fenofibrate (TRICOR) 145 MG tablet TAKE 1 TABLET BY MOUTH  EVERY DAY 11/24/15  Yes Peter M Martinique, MD  furosemide (LASIX) 20 MG tablet Take 2 tablets (40 mg total) by mouth daily. 10/30/15  Yes Peter M Martinique, MD  LANTUS SOLOSTAR 100 UNIT/ML Solostar Pen Inject 37 Units into the skin every morning.  08/18/15  Yes Historical Provider, MD  loratadine (CLARITIN) 10 MG tablet Take 1 tablet (10 mg total) by mouth daily. 07/02/15  Yes Barton Dubois, MD  memantine (NAMENDA) 10 MG tablet Take 1 tablet (10 mg total) by mouth 2 (two) times daily. 08/18/15  Yes Marcial Pacas, MD  Multiple Vitamins-Minerals (PRESERVISION AREDS 2) CAPS Take 1 capsule by mouth 2 (two) times daily.   Yes Historical Provider, MD  nitroGLYCERIN (NITRODUR - DOSED IN MG/24 HR) 0.2 mg/hr patch Place 1 patch (0.2 mg total) onto the skin daily. 09/15/15  Yes Peter M Martinique, MD  pantoprazole (PROTONIX) 20 MG tablet Take 1 tablet (20 mg total) by mouth daily. 08/26/15  Yes Mauri Pole, MD  pravastatin (PRAVACHOL) 40 MG tablet TAKE 1 TABLET(40 MG) BY MOUTH EVERY EVENING 07/02/15  Yes Peter M Martinique, MD  predniSONE (DELTASONE) 10 MG tablet Take 10 mg by mouth taper from 4 doses each day to 1 dose and stop. Final dose is due today 04-21-16 04/13/16  Yes Historical Provider, MD  PROVENTIL HFA 108 (90 BASE) MCG/ACT inhaler Inhale 2 puffs into the lungs every 6 (six) hours as needed for wheezing or shortness of breath. As directed 05/24/13  Yes Historical Provider, MD  SPIRIVA HANDIHALER 18 MCG inhalation capsule INHALE CONTENTS OF 1 CAPSULE VIA HANDIHALER ONCE DAILY 02/29/16  Yes Juanito Doom, MD  SYMBICORT 160-4.5 MCG/ACT inhaler INHALE 2 PUFFS BY MOUTH TWICE DAILY 10/26/15  Yes Juanito Doom, MD    Physical Exam: Vitals:   04/21/16 0900 04/21/16 0930 04/21/16 0945 04/21/16 0946  BP: (!) 115/54 (!) 106/45 (!) 112/52 (!) 106/45  Pulse: 111 (!) 121 114 117  Resp:    24  Temp:      TempSrc:      SpO2: 100% 96% 95% 95%     General: mildly anxious, resting in bed.  Eyes:  PERRL, EOMI, normal  lids, iris ENT: poor dentition/dnetures, dry mm Neck:  no LAD, masses or thyromegaly Cardiovascular: Appreciate due to respiratory overtones, regular rate and rhythm, 2/6 systolic murmur, no JVD, 2+ distal pulses, trace bilateral lower pitting edema. Respiratory: Decreased breath sounds throughout, wheezing throughout, few crackles, diminished in the bases, increased effort Abdomen:  soft, ntnd, NABS Skin:  no rash or induration seen on limited exam Musculoskeletal:  grossly normal tone BUE/BLE, good ROM, no bony abnormality Psychiatric:  grossly normal mood and affect, speech fluent and appropriate, AOx3 Neurologic:  CN 2-12 grossly intact, moves all extremities in coordinated fashion, sensation intact  Labs on Admission: I have personally reviewed following labs and imaging studies  CBC:  Recent Labs Lab 04/21/16 0844  WBC 14.2*  NEUTROABS 9.5*  HGB 15.3  HCT 49.1  MCV 88.2  PLT 812   Basic Metabolic Panel:  Recent Labs Lab 04/21/16 0844  NA 139  K 4.7  CL 104  CO2 21*  GLUCOSE 242*  BUN 49*  CREATININE 1.87*  CALCIUM 9.1   GFR: Estimated Creatinine Clearance: 34.7 mL/min (by C-G formula based on SCr of 1.87 mg/dL (H)). Liver Function Tests:  Recent Labs Lab 04/21/16 0844  AST 27  ALT 23  ALKPHOS 46  BILITOT 0.7  PROT 6.4*  ALBUMIN 3.2*   No results for input(s): LIPASE, AMYLASE in the last 168 hours. No results for input(s): AMMONIA in the last 168 hours. Coagulation Profile: No results for input(s): INR, PROTIME in the last 168 hours. Cardiac Enzymes:  Recent Labs Lab 04/21/16 1036  TROPONINI 0.03*   BNP (last 3 results) No results for input(s): PROBNP in the last 8760 hours. HbA1C: No results for input(s): HGBA1C in the last 72 hours. CBG: No results for input(s): GLUCAP in the last 168 hours. Lipid Profile: No results for input(s): CHOL, HDL, LDLCALC, TRIG, CHOLHDL, LDLDIRECT in the last 72 hours. Thyroid Function Tests: No results for  input(s): TSH, T4TOTAL, FREET4, T3FREE, THYROIDAB in the last 72 hours. Anemia Panel: No results for input(s): VITAMINB12, FOLATE, FERRITIN, TIBC, IRON, RETICCTPCT in the last 72 hours. Urine analysis:    Component Value Date/Time  COLORURINE YELLOW 07/13/2015 1951   APPEARANCEUR CLEAR 07/13/2015 1951   LABSPEC 1.024 07/13/2015 1951   PHURINE 6.0 07/13/2015 1951   GLUCOSEU >1000 (A) 07/13/2015 1951   HGBUR NEGATIVE 07/13/2015 [place] NEGATIVE 07/13/2015 1951   BILIRUBINUR neg 06/06/2012 [place] 07/13/2015 1951   PROTEINUR NEGATIVE 07/13/2015 1951   UROBILINOGEN 0.2 05/12/2015 0920   NITRITE NEGATIVE 07/13/2015 1951   LEUKOCYTESUR NEGATIVE 07/13/2015 1951    Creatinine Clearance: Estimated Creatinine Clearance: 34.7 mL/min (by C-G formula based on SCr of 1.87 mg/dL (H)).  Sepsis Labs: @LABRCNTIP (procalcitonin:4,lacticidven:4) )No results found for this or any previous visit (from the past 240 hour(s)).   Radiological Exams on Admission: Dg Chest 2 View  Result Date: 04/21/2016 CLINICAL DATA:  Shortness of Breath EXAM: CHEST  2 VIEW COMPARISON:  07/13/2015 FINDINGS: There is a left chest wall pacer device with lead in the right atrial appendage and right ventricle. Heart size is normal. No pleural effusion or edema. No airspace consolidation identified. The visualized osseous structures are unremarkable. IMPRESSION: 1. No active cardiopulmonary abnormalities. Electronically Signed   By: Kerby Moors M.D.   On: 04/21/2016 10:24    EKG: Independently reviewed.   Assessment/Plan Active Problems:   Hyperlipidemia   GERD (gastroesophageal reflux disease)   COPD with acute exacerbation ([name])   Sepsis ([place])   AICD (automatic cardioverter/defibrillator) present   CKD (chronic kidney disease), stage III   Type 2 diabetes mellitus with complication, with long-term current use of insulin ([name])   Acute respiratory failure ([name])   Chronic combined  systolic (congestive) and diastolic (congestive) heart failure ([name])   Memory loss   Sepsis/Acute respiratory failure: Suspect combination of COPD exacerbation and possible CAP. Failed outpatient management with steroids and amoxicillin and nebs. CXR without evidence of acute pulmonary process to anticipate patient is somewhat dehydrated resulting in pneumonia not showing up on x-ray. Lactic acid 2.1, troponin 0.03, WBC 14.2 with left shift, tachycardic, tachypnea, hypotension, hypoxemia. - solumedrol 60 q8 - Duonebs Q4 - Albuterol Q2PRN - mucinex - Rocephin/Azithro - continue spiriva and dulera - Trend Lactic acid - Wean O2 as able - f/u BCX  Severe chronic diastolic and systolic CHF colonic currently compensated. Echo from 04/02/2015 showing EF of 84% with diastolic dysfunction. 1 L normal saline bolus given in ED due to patient being septic with lowering blood pressures. MIlely elevated Trop to 0.03. BNP 154.  - Strict I's and O's, daily weights - Gentle hydration - Repeat echo - Resume Lasix when BP improves.  - Cycle trop  CKD: Cr 1.89. Near baseline - BMET in am  DM: - continue lantus in am - SSI  H/o MI/PVC: no active CP. EKG w/o ACS.  - continue ASA, Statin - Cycle trop  GERD: - continuie ppi  Memory loss / confusion: chronic and at baseline.  - continue namenda and aricept   DVT prophylaxis: hep  Code Status: full  Family Communication: none  Disposition Plan: pending improvement  Consults called: none  Admission status: inpt    Dashonda Bonneau J MD [facility]  If 7PM-7AM, please contact night-coverage [email or web address] Password [email or web address]  04/21/2016, 1:48 PM

## 2016-04-21 NOTE — ED Provider Notes (Signed)
[facility] DEPT Provider Note   CSN: [id number] Arrival date & time: 04/21/16  0808     History   Chief Complaint Chief Complaint  Patient presents with  . Shortness of Breath    HPI Margarita ALLAH REASON is a 82 y.o. male.  Pt reports to the ED via EMS with sob.  The pt said that he has had a cough.  He does have a hx of copd and uses inhalers.  The EMS said pt said that he was feeling a little confused this am, but that has resolved.  He does not wear home o2, but he was placed on oxygen by ems as his ra o2 sat was 91%.  Pt's family said that he saw his pulmonologist a few days ago and was put on amox and prednisone.  He initially seemed to be doing well, then started getting sick yesterday.      Past Medical History:  Diagnosis Date  . Achalasia   . AICD (automatic cardioverter/defibrillator) present 03/30/2011   Analyze ST study patient  . Anginal pain ([place])   . BPH (benign prostatic hyperplasia)   . CHF (congestive heart failure) ([place])   . Chronic systolic dysfunction of left ventricle    EF 30-35%, CLASS II - III SYMPTOMS; intolerant to Coreg  . CKD (chronic kidney disease) stage 3, GFR 30-59 ml/min 06/08/2012  . COPD (chronic obstructive pulmonary disease) ([place])   . Coronary artery disease    History of remote anterior MI with PCI to LAD in 2006; most recent cath 2007, no intervention required  . DOE (dyspnea on exertion)    with heavy exertion  . Dysrhythmia   . Esophageal dysmotility   . GERD (gastroesophageal reflux disease)   . Head injury, closed, with concussion ([place]) 2000ish  . Heart murmur    hx  . HOH (hard of hearing)   . Hyperlipidemia   . Memory loss    improved  . Myocardial infarction ([place]) 1994   ANTERIOR  . Pneumonia "several times"   aspiration pna with at least 3 admits for this 2016.   Marland Kitchen PVC's (premature ventricular contractions)   . Renal failure   . Skin cancer "several"   "forearms; head"  . Type II diabetes mellitus [facility])      Patient Active Problem List   Diagnosis Date Noted  . Acute respiratory failure ([place]) 04/21/2016  . Allergic rhinitis 04/11/2016  . Chronic systolic CHF (congestive heart failure) ([place]) 10/30/2015  . Mild cognitive impairment 08/18/2015  . Difficulty in swallowing   . Pneumonia 05/12/2015  . AKI (acute kidney injury) ([place])   . Type 2 diabetes mellitus with complication, with long-term current use of insulin ([place])   . Chronic systolic (congestive) heart failure ([place])   . Chronic kidney disease, stage III (moderate)   . Other specified hypotension   . CKD (chronic kidney disease), stage III   . Sepsis due to pneumonia ([place])   . Aspiration pneumonia due to food (regurgitated) ([place])   . Pneumonitis   . Other emphysema ([place])   . Systolic CHF, acute on chronic ([name])   . AICD (automatic cardioverter/defibrillator) present   . CAD S/P percutaneous coronary angioplasty   . Diabetes type 2, uncontrolled ([place])   . Acute respiratory failure with hypoxia ([place]) 04/03/2015  . Cardiomyopathy, ischemic 04/03/2015  . Elevated troponin 04/03/2015  . Sepsis ([place]) 03/31/2015  . Blood poisoning ([place])   . COPD exacerbation ([place])   . HCAP (healthcare-associated pneumonia) and recurrent  Aspiration Pneumonia 03/21/2015  . Aspiration into airway 03/21/2015  . Achalasia 03/21/2015  . Dysphagia   . Aspiration pneumonia ([name])   . Esophageal dysphagia   . Esophageal dysmotilities   . PNA (pneumonia) 02/07/2015  . COPD with acute exacerbation ([name]) 01/14/2015  . Left ventricular apical thrombus ([name]) 01/14/2015  . Acute on chronic systolic CHF (congestive heart failure) ([name]) 01/14/2015  . ARF (acute renal failure) ([name]) 01/14/2015  . Dysphagia, unspecified(787.20) 10/30/2013  . COPD GOLD IV 10/07/2013  . Influenza with pneumonia 09/11/2013  . Leukocytosis 09/11/2013  . Pneumonia, community acquired 09/10/2013  . Acute encephalopathy 09/10/2013  . Renal failure (ARF), acute on chronic ([name])  09/10/2013  . Automatic implantable cardioverter-defibrillator in situ 07/06/2012  . CKD (chronic kidney disease) stage 3, GFR 30-59 ml/min 06/08/2012  . GERD (gastroesophageal reflux disease) 09/26/2011  . BPH (benign prostatic hyperplasia) 08/22/2011  . COPD (chronic obstructive pulmonary disease) ([name]) 07/17/2011  . S/P ICD (internal cardiac defibrillator) procedure 04/20/2011  . Diabetes mellitus type II, non insulin dependent ([name]) 01/21/2011  . Coronary artery disease   . Sick sinus syndrome ([name])   . Acute on chronic combined systolic and diastolic congestive heart failure, NYHA class 3 ([name])   . Hyperlipidemia   . PVC's (premature ventricular contractions)     Past Surgical History:  Procedure Laterality Date  . BALLOON DILATION N/A 09/09/2015   Procedure: BALLOON DILATION;  Surgeon: Napoleon Form, MD;  Location: [facility] ENDOSCOPY;  Service: Endoscopy;  Laterality: N/A;  rigiflex achalasia balloon dilators  . BOTOX INJECTION N/A 04/08/2015   Procedure: BOTOX INJECTION;  Surgeon: Rachael Fee, MD;  Location: [facility] ENDOSCOPY;  Service: Endoscopy;  Laterality: N/A;  . CARDIAC CATHETERIZATION  01/11/2006   DEMONSTRATES AKINESIA OF THE DISTAL ANTERIOR WALL, DISTAL INFERIOR WALL AND AKINESIA OF THE APEX. THE BASAL SEGMENTS CONTRACT WELL AND OVERALL EF 35%  . CATARACT EXTRACTION W/ INTRAOCULAR LENS  IMPLANT, BILATERAL  08/2014-09/2014  . CHOLECYSTECTOMY  2/11  . CORONARY ANGIOPLASTY  1994   TO THE LAD  . ESOPHAGEAL MANOMETRY N/A 03/16/2015   Procedure: ESOPHAGEAL MANOMETRY (EM);  Surgeon: Beverley Fiedler, MD;  Location: [facility] ENDOSCOPY;  Service: Gastroenterology;  Laterality: N/A;  . ESOPHAGOGASTRODUODENOSCOPY N/A 04/08/2015   Procedure: ESOPHAGOGASTRODUODENOSCOPY (EGD);  Surgeon: Rachael Fee, MD;  Location: [facility] ENDOSCOPY;  Service: Endoscopy;  Laterality: N/A;  . ESOPHAGOGASTRODUODENOSCOPY (EGD) WITH PROPOFOL N/A 09/09/2015   Procedure: ESOPHAGOGASTRODUODENOSCOPY (EGD) WITH PROPOFOL;  Surgeon:  Napoleon Form, MD;  Location: [facility] ENDOSCOPY;  Service: Endoscopy;  Laterality: N/A;  pt. is to have gastrografin xray post recovery-do not discharge until results are back  . FOOT FRACTURE SURGERY Right 1980's  . INSERT / REPLACE / REMOVE PACEMAKER    . SKIN CANCER EXCISION  "several"   "forearms, head"       Home Medications    Prior to Admission medications   Medication Sig Start Date End Date Taking? Authorizing Provider  amoxicillin (AMOXIL) 875 MG tablet Take 875 mg by mouth 2 (two) times daily.  04/18/16 04/22/16 Yes Historical Provider, MD  aspirin 81 MG EC tablet Take 81 mg by mouth daily.     Yes Historical Provider, MD  Cholecalciferol (VITAMIN D PO) Take 2,000 Units by mouth daily.    Yes Historical Provider, MD  donepezil (ARICEPT) 10 MG tablet Take 1 tablet (10 mg total) by mouth every morning. 08/18/15  Yes Levert Feinstein, MD  fenofibrate (TRICOR) 145 MG tablet TAKE 1 TABLET BY MOUTH EVERY DAY  11/24/15  Yes Peter M Martinique, MD  furosemide (LASIX) 20 MG tablet Take 2 tablets (40 mg total) by mouth daily. 10/30/15  Yes Peter M Martinique, MD  LANTUS SOLOSTAR 100 UNIT/ML Solostar Pen Inject 37 Units into the skin every morning.  08/18/15  Yes Historical Provider, MD  loratadine (CLARITIN) 10 MG tablet Take 1 tablet (10 mg total) by mouth daily. 07/02/15  Yes Barton Dubois, MD  memantine (NAMENDA) 10 MG tablet Take 1 tablet (10 mg total) by mouth 2 (two) times daily. 08/18/15  Yes Marcial Pacas, MD  Multiple Vitamins-Minerals (PRESERVISION AREDS 2) CAPS Take 1 capsule by mouth 2 (two) times daily.   Yes Historical Provider, MD  nitroGLYCERIN (NITRODUR - DOSED IN MG/24 HR) 0.2 mg/hr patch Place 1 patch (0.2 mg total) onto the skin daily. 09/15/15  Yes Peter M Martinique, MD  pantoprazole (PROTONIX) 20 MG tablet Take 1 tablet (20 mg total) by mouth daily. 08/26/15  Yes Mauri Pole, MD  pravastatin (PRAVACHOL) 40 MG tablet TAKE 1 TABLET(40 MG) BY MOUTH EVERY EVENING 07/02/15  Yes Peter M Martinique, MD   predniSONE (DELTASONE) 10 MG tablet Take 10 mg by mouth taper from 4 doses each day to 1 dose and stop. Final dose is due today 04-21-16 04/13/16  Yes Historical Provider, MD  PROVENTIL HFA 108 (90 BASE) MCG/ACT inhaler Inhale 2 puffs into the lungs every 6 (six) hours as needed for wheezing or shortness of breath. As directed 05/24/13  Yes Historical Provider, MD  SPIRIVA HANDIHALER 18 MCG inhalation capsule INHALE CONTENTS OF 1 CAPSULE VIA HANDIHALER ONCE DAILY 02/29/16  Yes Juanito Doom, MD  SYMBICORT 160-4.5 MCG/ACT inhaler INHALE 2 PUFFS BY MOUTH TWICE DAILY 10/26/15  Yes Juanito Doom, MD    Family History Family History  Problem Relation Age of Onset  . Stroke Father     Mother history unknown - never met her  . Colon cancer Neg Hx     Social History Social History  Substance Use Topics  . Smoking status: Former Smoker    Packs/day: 1.00    Years: 35.00    Types: Cigarettes    Quit date: 08/01/1992  . Smokeless tobacco: Never Used  . Alcohol use 0.0 oz/week     Comment: Approximately a beer every 2 years     Allergies   Codeine; Dilaudid [hydromorphone hcl]; Flomax [tamsulosin hcl]; Morphine and related; Sulfa antibiotics; Beta adrenergic blockers; and Carvedilol   Review of Systems Review of Systems  Respiratory: Positive for cough, shortness of breath and wheezing.   All other systems reviewed and are negative.    Physical Exam Updated Vital Signs BP (!) 106/45 (BP Location: Right Arm)   Pulse 117   Temp 98.8 F (37.1 C) (Oral)   Resp 24   SpO2 95%   Physical Exam  Constitutional: He is oriented to person, place, and time. He appears well-developed. He appears distressed.  HENT:  Head: Normocephalic and atraumatic.  Right Ear: External ear normal.  Left Ear: External ear normal.  Nose: Nose normal.  Mouth/Throat: Oropharynx is clear and moist.  Eyes: Conjunctivae and EOM are normal. Pupils are equal, round, and reactive to light.  Neck: Normal  range of motion. Neck supple.  Cardiovascular: Normal rate, regular rhythm, normal heart sounds and intact distal pulses.   Pulmonary/Chest: Tachypnea noted. He has wheezes.  Abdominal: Soft. Bowel sounds are normal.  Musculoskeletal: Normal range of motion.  Neurological: He is alert and oriented to person, place,  and time.  Skin: Skin is warm and dry.  Psychiatric: He has a normal mood and affect. His behavior is normal. Judgment and thought content normal.  Nursing note and vitals reviewed.    ED Treatments / Results  Labs (all labs ordered are listed, but only abnormal results are displayed) Labs Reviewed  COMPREHENSIVE METABOLIC PANEL - Abnormal; Notable for the following:       Result Value   CO2 21 (*)    Glucose, Bld 242 (*)    BUN 49 (*)    Creatinine, Ser 1.87 (*)    Total Protein 6.4 (*)    Albumin 3.2 (*)    GFR calc non Af Amer 32 (*)    GFR calc Af Amer 37 (*)    All other components within normal limits  CBC WITH DIFFERENTIAL/PLATELET - Abnormal; Notable for the following:    WBC 14.2 (*)    Neutro Abs 9.5 (*)    Monocytes Absolute 1.5 (*)    All other components within normal limits  BRAIN NATRIURETIC PEPTIDE - Abnormal; Notable for the following:    B Natriuretic Peptide 154.2 (*)    All other components within normal limits  LACTIC ACID, PLASMA - Abnormal; Notable for the following:    Lactic Acid, Venous 2.1 (*)    All other components within normal limits  TROPONIN I - Abnormal; Notable for the following:    Troponin I 0.03 (*)    All other components within normal limits  CULTURE, BLOOD (ROUTINE X 2)  CULTURE, BLOOD (ROUTINE X 2)  LACTIC ACID, PLASMA  URINALYSIS, ROUTINE W REFLEX MICROSCOPIC (NOT AT [facility])    EKG  EKG Interpretation  Date/Time:  Thursday April 21 2016 08:14:00 EDT Ventricular Rate:  98 PR Interval:    QRS Duration: 139 QT Interval:  357 QTC Calculation: 456 R Axis:   -81 Text Interpretation:  Sinus rhythm Ventricular  premature complex Aberrant conduction of SV complex(es) Nonspecific IVCD with LAD Left ventricular hypertrophy Anterolateral infarct, old Confirmed by [facility] MD, Jamaiya Tunnell ([id number]) on 04/21/2016 9:04:26 AM       Radiology Dg Chest 2 View  Result Date: 04/21/2016 CLINICAL DATA:  Shortness of Breath EXAM: CHEST  2 VIEW COMPARISON:  07/13/2015 FINDINGS: There is a left chest wall pacer device with lead in the right atrial appendage and right ventricle. Heart size is normal. No pleural effusion or edema. No airspace consolidation identified. The visualized osseous structures are unremarkable. IMPRESSION: 1. No active cardiopulmonary abnormalities. Electronically Signed   By: Kerby Moors M.D.   On: 04/21/2016 10:24    Procedures Procedures (including critical care time)  Medications Ordered in ED Medications  sodium chloride 0.9 % bolus 1,000 mL (not administered)  cefTRIAXone (ROCEPHIN) 1 g in dextrose 5 % 50 mL IVPB (not administered)  azithromycin (ZITHROMAX) tablet 500 mg (not administered)  albuterol (PROVENTIL,VENTOLIN) solution continuous neb (10 mg/hr Nebulization Given 04/21/16 0822)  methylPREDNISolone sodium succinate (SOLU-MEDROL) 125 mg/2 mL injection 125 mg (125 mg Intravenous Given 04/21/16 0831)     Initial Impression / Assessment and Plan / ED Course  I have reviewed the triage vital signs and the nursing notes.  Pertinent labs & imaging results that were available during my care of the patient were reviewed by me and considered in my medical decision making (see chart for details).  Clinical Course   I suspect pt has pneumonia, but cxr is clear.  The pt is tachycardic, with leukocytosis, and an elevated RR and  WBC.  BP is a little low.  I will treat for CAP and also give pt fluids.  Pt will be d/w unassigned for admission.   Pt d/w Dr. Marily Memos who will admit patient.  Final Clinical Impressions(s) / ED Diagnoses   Final diagnoses:  COPD exacerbation ([place])  Hypoxia    Acute bronchitis, unspecified organism  SIRS (systemic inflammatory response syndrome) ([name])  Hyperglycemia  CRI (chronic renal insufficiency), unspecified stage    New Prescriptions New Prescriptions   No medications on file     Isla Pence, MD 04/21/16 1140

## 2016-04-21 NOTE — Progress Notes (Signed)
1907 pt admitted from ED with family member . Kept comfortable in bed . Safety precaution obesrved . Critical value of lactic acid received from lab and report given to evening duty RN repeat lactic acid is being drawn by lab

## 2016-04-21 NOTE — Telephone Encounter (Signed)
Called spoke with pt son. He reports they had to call EMS, pt was having a lot of SOB. Pt currently in the ED. FYI for Dr. Lake Bells

## 2016-04-21 NOTE — ED Triage Notes (Signed)
Per [facility] patient complaining of shortness of breath.  Patient states he uses breathing treatments at home but they have not helped.  Patient alert and oriented, on 2L N/C from EMS O2 saturation 95%.  92% on room air.

## 2016-04-21 NOTE — Consult Note (Signed)
PULMONARY / CRITICAL CARE MEDICINE   Name: Antonio Ray MRN: [id number] DOB: 1933-08-30    ADMISSION DATE:  04/21/2016 CONSULTATION DATE:  04/21/2016  REFERRING MD:  Marily Memos  CHIEF COMPLAINT:   HISTORY OF PRESENT ILLNESS:   82 y/o male with COPD well known to me from the office due to severe COPD presented to the ER today with dyspnea in the setting of a cough productive of clear mucus.  He was last seen by me approximately 1 week ago when he reported feeling remarkably well over the last several months. He had been walking is much as 3 miles a day on room air. However, he had a cough with some congestion which she felt was likely related to ragweed at the time. His lungs were clear and he had walked 1/2 miles that day. A couple days later his symptoms worsen and his son noted that his oxygen saturation was a bit lower so he went to urgent care where he was treated with prednisone and amoxicillin. He said his cough persisted with some clear mucus production but he did feel a little better. However, last night he developed a worsening cough with increasing mucus volume, again no color just clear mucus. No fevers or chills. He's noted some leg swelling as well. This morning he knew "something wasn't quite right" and his family brought him to the emergency room. Here he was noted to be hypoxemic, tachycardic, and was quite short of breath. He's received antibiotics, some IV fluids, Solu-Medrol, and bronchodilators. He now feels better.  PAST MEDICAL HISTORY :  He  has a past medical history of Achalasia; AICD (automatic cardioverter/defibrillator) present (03/30/2011); Anginal pain ([name]); BPH (benign prostatic hyperplasia); CHF (congestive heart failure) ([place]); Chronic systolic dysfunction of left ventricle; CKD (chronic kidney disease) stage 3, GFR 30-59 ml/min (06/08/2012); COPD (chronic obstructive pulmonary disease) ([place]); Coronary artery disease; DOE (dyspnea on exertion); Dysrhythmia; Esophageal  dysmotility; GERD (gastroesophageal reflux disease); Head injury, closed, with concussion ([place]) [id number]); Heart murmur; HOH (hard of hearing); Hyperlipidemia; Memory loss; Myocardial infarction ([place]) (1994); Pneumonia ("several times"); PVC's (premature ventricular contractions); Renal failure; Skin cancer ("several"); and Type II diabetes mellitus ([place]).  PAST SURGICAL HISTORY: He  has a past surgical history that includes Foot fracture surgery (Right, 1980's); Cholecystectomy (2/11); Cataract extraction w/ intraocular lens  implant, bilateral (08/2014-09/2014); Cardiac catheterization (01/11/2006); Coronary angioplasty (1994); Skin cancer excision ("several"); Esophageal manometry (N/A, 03/16/2015); Insert / replace / remove pacemaker; Esophagogastroduodenoscopy (N/A, 04/08/2015); Botox injection (N/A, 04/08/2015); Esophagogastroduodenoscopy (egd) with propofol (N/A, 09/09/2015); and Balloon dilation (N/A, 09/09/2015).  Allergies  Allergen Reactions  . Codeine Other (See Comments)    Gets very angry, disoriented  . Dilaudid [Hydromorphone Hcl] Other (See Comments)    VERY AGITATED, HOSTILE  . Flomax [Tamsulosin Hcl] Shortness Of Breath  . Morphine And Related Other (See Comments)    VERY AGITATED, HOSTILE  . Sulfa Antibiotics Shortness Of Breath  . Beta Adrenergic Blockers Other (See Comments)    ? disorientation  . Carvedilol Other (See Comments)    DISORIENTATION    No current facility-administered medications on file prior to encounter.    Current Outpatient Prescriptions on File Prior to Encounter  Medication Sig  . aspirin 81 MG EC tablet Take 81 mg by mouth daily.    . Cholecalciferol (VITAMIN D PO) Take 2,000 Units by mouth daily.   Marland Kitchen donepezil (ARICEPT) 10 MG tablet Take 1 tablet (10 mg total) by mouth every morning.  . fenofibrate (TRICOR) 145 MG  tablet TAKE 1 TABLET BY MOUTH EVERY DAY  . furosemide (LASIX) 20 MG tablet Take 2 tablets (40 mg total) by mouth daily.  Marland Kitchen LANTUS SOLOSTAR 100  UNIT/ML Solostar Pen Inject 37 Units into the skin every morning.   . loratadine (CLARITIN) 10 MG tablet Take 1 tablet (10 mg total) by mouth daily.  . memantine (NAMENDA) 10 MG tablet Take 1 tablet (10 mg total) by mouth 2 (two) times daily.  . Multiple Vitamins-Minerals (PRESERVISION AREDS 2) CAPS Take 1 capsule by mouth 2 (two) times daily.  . nitroGLYCERIN (NITRODUR - DOSED IN MG/24 HR) 0.2 mg/hr patch Place 1 patch (0.2 mg total) onto the skin daily.  . pantoprazole (PROTONIX) 20 MG tablet Take 1 tablet (20 mg total) by mouth daily.  . pravastatin (PRAVACHOL) 40 MG tablet TAKE 1 TABLET(40 MG) BY MOUTH EVERY EVENING  . PROVENTIL HFA 108 (90 BASE) MCG/ACT inhaler Inhale 2 puffs into the lungs every 6 (six) hours as needed for wheezing or shortness of breath. As directed  . SPIRIVA HANDIHALER 18 MCG inhalation capsule INHALE CONTENTS OF 1 CAPSULE VIA HANDIHALER ONCE DAILY  . SYMBICORT 160-4.5 MCG/ACT inhaler INHALE 2 PUFFS BY MOUTH TWICE DAILY    FAMILY HISTORY:  His indicated that his mother is deceased. He indicated that his father is deceased. He indicated that only one of his two sisters is alive. He indicated that the status of his neg hx is unknown.    SOCIAL HISTORY: He  reports that he quit smoking about 23 years ago. His smoking use included Cigarettes. He has a 35.00 pack-year smoking history. He has never used smokeless tobacco. He reports that he drinks alcohol. He reports that he does not use drugs.  REVIEW OF SYSTEMS:   Gen: Denies fever, chills, weight change, fatigue, night sweats HEENT: Denies blurred vision, double vision, hearing loss, tinnitus, sinus congestion, rhinorrhea, sore throat, neck stiffness, dysphagia PULM: per HPI CV: Denies chest pain, + edema, orthopnea, paroxysmal nocturnal dyspnea, palpitations GI: Denies abdominal pain, nausea, vomiting, diarrhea, hematochezia, melena, constipation, change in bowel habits GU: Denies dysuria, hematuria, polyuria,  oliguria, urethral discharge Endocrine: Denies hot or cold intolerance, polyuria, polyphagia or appetite change Derm: Denies rash, dry skin, scaling or peeling skin change Heme: Denies easy bruising, bleeding, bleeding gums Neuro: Denies headache, numbness, weakness, slurred speech, loss of memory or consciousness   SUBJECTIVE:  As above  VITAL SIGNS: BP (!) 106/45 (BP Location: Right Arm)   Pulse 117   Temp 98.8 F (37.1 C) (Oral)   Resp 24   SpO2 96%   HEMODYNAMICS:    VENTILATOR SETTINGS:    INTAKE / OUTPUT: No intake/output data recorded.  PHYSICAL EXAMINATION: General:  Resting comfortably in bed Neuro:  Awake alert, oriented 4, moves all 4 extremities HEENT:  Normocephalic atraumatic, oropharynx clear Cardiovascular:  Regular rate and rhythm no murmurs gallops rubs, no JVD Lungs:  Few rhonchi in upper lobes, mild wheeze left upper lobe, otherwise clear with normal air movement Abdomen:  Bowel sounds positive, nontender nondistended Musculoskeletal:  Normal bulk and tone Skin:  Trace edema legs bilaterally  LABS:  BMET  Recent Labs Lab 04/21/16 0844 04/21/16 1516  NA 139  --   K 4.7  --   CL 104  --   CO2 21*  --   BUN 49*  --   CREATININE 1.87* 2.16*  GLUCOSE 242*  --     Electrolytes  Recent Labs Lab 04/21/16 0844  CALCIUM 9.1  CBC  Recent Labs Lab 04/21/16 0844  WBC 14.2*  HGB 15.3  HCT 49.1  PLT 197    Coag's No results for input(s): APTT, INR in the last 168 hours.  Sepsis Markers  Recent Labs Lab 04/21/16 0845 04/21/16 1516  LATICACIDVEN 2.1*  --   PROCALCITON  --  0.10    ABG No results for input(s): PHART, PCO2ART, PO2ART in the last 168 hours.  Liver Enzymes  Recent Labs Lab 04/21/16 0844  AST 27  ALT 23  ALKPHOS 46  BILITOT 0.7  ALBUMIN 3.2*    Cardiac Enzymes  Recent Labs Lab 04/21/16 1036 04/21/16 1516  TROPONINI 0.03* 0.04*    Glucose No results for input(s): GLUCAP in the last 168  hours.  Imaging Dg Chest 2 View  Result Date: 04/21/2016 CLINICAL DATA:  Shortness of Breath EXAM: CHEST  2 VIEW COMPARISON:  07/13/2015 FINDINGS: There is a left chest wall pacer device with lead in the right atrial appendage and right ventricle. Heart size is normal. No pleural effusion or edema. No airspace consolidation identified. The visualized osseous structures are unremarkable. IMPRESSION: 1. No active cardiopulmonary abnormalities. Electronically Signed   By: Kerby Moors M.D.   On: 04/21/2016 10:24     STUDIES:  Chest x-ray today perhaps increased vascularity, no infiltrate EKG today with left anterior fascicular block, sinus rhythm 98, mild ST depression just in lead 3, no other ST wave changes noted  CULTURES: Blood culture September 21  ANTIBIOTICS: Ceftriaxone Azithromycin  SIGNIFICANT EVENTS:   LINES/TUBES:   DISCUSSION: Pleasant 82 year old male came to the emergency room today with acute on chronic respiratory failure with hypoxemia in the setting of some mild leg swelling tachycardia and slightly elevated troponin in the last couple days. Denied any chest pain or shortness of breath. On my exam he is not wheezing but he was reportedly wheezing earlier today. Presumably this represents an exacerbation of COPD, but his leg swelling and slightly elevated troponin bother me a bit. Differential diagnosis includes exacerbation of systolic heart failure versus less likely pulmonary embolism.   ASSESSMENT / PLAN:  PULMONARY A: COPD, in exacerbation Acute respiratory failure with hypoxemia Doubt pneumonia P:   Change dulera to brovana and pulmicort Continue antibiotics Check d-dimer, if elevated consider V/Q scan Will check doppler legs given lower extremity edema Continue solumedrol  CARDIOVASCULAR A:  Baseline chronic systolic heart failure, doesn't seem to be in exacerbation NSTEMI> suspect demand ischemia P:  Tele KVO fluids  Roselie Awkward,  MD [facility] Pager: [phone] Cell: [phone] After 3pm or if no response, call [phone]  04/21/2016, 5:13 PM

## 2016-04-22 ENCOUNTER — Inpatient Hospital Stay (HOSPITAL_COMMUNITY): Payer: Medicare Other

## 2016-04-22 DIAGNOSIS — Z794 Long term (current) use of insulin: Secondary | ICD-10-CM

## 2016-04-22 DIAGNOSIS — N183 Chronic kidney disease, stage 3 (moderate): Secondary | ICD-10-CM

## 2016-04-22 DIAGNOSIS — I509 Heart failure, unspecified: Secondary | ICD-10-CM

## 2016-04-22 DIAGNOSIS — K219 Gastro-esophageal reflux disease without esophagitis: Secondary | ICD-10-CM

## 2016-04-22 DIAGNOSIS — M7989 Other specified soft tissue disorders: Secondary | ICD-10-CM

## 2016-04-22 DIAGNOSIS — J9601 Acute respiratory failure with hypoxia: Secondary | ICD-10-CM

## 2016-04-22 DIAGNOSIS — E118 Type 2 diabetes mellitus with unspecified complications: Secondary | ICD-10-CM

## 2016-04-22 LAB — GLUCOSE, CAPILLARY
GLUCOSE-CAPILLARY: 270 mg/dL — AB (ref 65–99)
GLUCOSE-CAPILLARY: 470 mg/dL — AB (ref 65–99)
Glucose-Capillary: 179 mg/dL — ABNORMAL HIGH (ref 65–99)

## 2016-04-22 LAB — CBC
HCT: 42 % (ref 39.0–52.0)
Hemoglobin: 13.1 g/dL (ref 13.0–17.0)
MCH: 27.4 pg (ref 26.0–34.0)
MCHC: 31.2 g/dL (ref 30.0–36.0)
MCV: 87.9 fL (ref 78.0–100.0)
Platelets: 215 K/uL (ref 150–400)
RBC: 4.78 MIL/uL (ref 4.22–5.81)
RDW: 14.9 % (ref 11.5–15.5)
WBC: 10.8 K/uL — ABNORMAL HIGH (ref 4.0–10.5)

## 2016-04-22 LAB — COMPREHENSIVE METABOLIC PANEL
ALT: 20 U/L (ref 17–63)
ANION GAP: 8 (ref 5–15)
AST: 18 U/L (ref 15–41)
Albumin: 2.8 g/dL — ABNORMAL LOW (ref 3.5–5.0)
Alkaline Phosphatase: 43 U/L (ref 38–126)
BUN: 47 mg/dL — ABNORMAL HIGH (ref 6–20)
CALCIUM: 8.7 mg/dL — AB (ref 8.9–10.3)
CHLORIDE: 104 mmol/L (ref 101–111)
CO2: 25 mmol/L (ref 22–32)
CREATININE: 1.74 mg/dL — AB (ref 0.61–1.24)
GFR, EST AFRICAN AMERICAN: 41 mL/min — AB (ref >60)
GFR, EST NON AFRICAN AMERICAN: 35 mL/min — AB (ref >60)
Glucose, Bld: 367 mg/dL — ABNORMAL HIGH (ref 65–99)
Potassium: 4.4 mmol/L (ref 3.5–5.1)
Sodium: 137 mmol/L (ref 135–145)
Total Bilirubin: 0.6 mg/dL (ref 0.3–1.2)
Total Protein: 5.7 g/dL — ABNORMAL LOW (ref 6.5–8.1)

## 2016-04-22 LAB — HEMOGLOBIN A1C
HEMOGLOBIN A1C: 10.2 % — AB (ref 4.8–5.6)
MEAN PLASMA GLUCOSE: 246 mg/dL

## 2016-04-22 LAB — ECHOCARDIOGRAM COMPLETE
HEIGHTINCHES: 68 in
WEIGHTICAEL: 3076.8 [oz_av]

## 2016-04-22 LAB — D-DIMER, QUANTITATIVE: D-Dimer, Quant: 0.44 ug/mL-FEU (ref 0.00–0.50)

## 2016-04-22 MED ORDER — AZITHROMYCIN 500 MG PO TABS
500.0000 mg | ORAL_TABLET | Freq: Every day | ORAL | 0 refills | Status: AC
Start: 2016-04-23 — End: 2016-04-28

## 2016-04-22 MED ORDER — PERFLUTREN LIPID MICROSPHERE
1.0000 mL | INTRAVENOUS | Status: AC | PRN
  Filled 2016-04-22: qty 10

## 2016-04-22 MED ORDER — GUAIFENESIN ER 600 MG PO TB12: 1200.0000 mg | ORAL_TABLET | Freq: Two times a day (BID) | ORAL | 0 refills | Status: AC

## 2016-04-22 MED ORDER — METHYLPREDNISOLONE SODIUM SUCC 125 MG IJ SOLR
60.0000 mg | Freq: Every day | INTRAMUSCULAR | Status: DC
  Administered 2016-04-22: 60 mg via INTRAVENOUS

## 2016-04-22 MED ORDER — ALBUTEROL SULFATE HFA 108 (90 BASE) MCG/ACT IN AERS: 2.0000 | INHALATION_SPRAY | Freq: Four times a day (QID) | RESPIRATORY_TRACT | 2 refills | Status: DC | PRN

## 2016-04-22 MED ORDER — INSULIN ASPART 100 UNIT/ML ~~LOC~~ SOLN
14.0000 [IU] | Freq: Once | SUBCUTANEOUS | Status: AC
  Administered 2016-04-22: 14 [IU] via SUBCUTANEOUS

## 2016-04-22 MED ORDER — PREDNISONE 10 MG PO TABS: 10.0000 mg | ORAL_TABLET | Freq: Every day | ORAL | 0 refills | Status: DC

## 2016-04-22 MED ORDER — AZITHROMYCIN 500 MG PO TABS
500.0000 mg | ORAL_TABLET | Freq: Every day | ORAL | Status: DC
  Administered 2016-04-22: 500 mg via ORAL
  Filled 2016-04-22: qty 1

## 2016-04-22 MED ORDER — PERFLUTREN LIPID MICROSPHERE
INTRAVENOUS | Status: AC
  Filled 2016-04-22: qty 10

## 2016-04-22 NOTE — Progress Notes (Signed)
*  PRELIMINARY RESULTS* Vascular Ultrasound Lower extremity venous duplex has been completed.  Preliminary findings: No evidence of DVT or baker's cyst.  Landry Mellow, RDMS, RVT  04/22/2016, 10:05 AM

## 2016-04-22 NOTE — Progress Notes (Signed)
[facility]  S: Feeling better, blood sugar down  O: Vitals:   04/22/16 0835 04/22/16 0837 04/22/16 0839 04/22/16 0840  BP:      Pulse:      Resp:      Temp:      TempSrc:      SpO2: 95% 95% 95% 95%  Weight:      Height:       BP 113/46, HR 61  RA  Gen: well appearing in bed HENT: OP clear, TM's clear, neck supple PULM: Few crackles bases B, normal percussion CV: RRR, no mgr, trace edema R > L GI: BS+, soft, nontender Derm: no cyanosis or rash Psyche: normal mood and affect  Labs reviewed, Cr better  Glucose better  Impression/Plan:  Acute respiratory failure with hypoxemia> presumably a COPD exacerbation as he is better today;  > CXR today > wean prednisone as follows: 30mg  daily x3 days, 20mg  daily x3 days > resume home bronchodilator regimen at discharge  Leg swelling: > check d-dimer and LE doppler ultrasound > given his severe COPD and renal function, neither CT angiogram or V/Q are good options for evaluating for PE.  Further, given his COPD exacerbation as the cause of symptoms the likelihood of PE is low.  OK for discharge if no DVT and CXR  Roselie Awkward, MD [facility] Pager: [phone] Cell: [phone] After 3pm or if no response, call [phone]

## 2016-04-22 NOTE — Care Management Note (Signed)
Case Management Note  Patient Details  Name: Antonio Ray MRN: [id number] Date of Birth: 10-Jun-1934  Subjective/Objective:        Admitted with Acute Respiratory Failure       Action/Plan: Patient lives at home alone, his daughter lives close by to assist him as needed. He continue to drive his vehicle, active in the community; No DME, has a walking stick that he use prn [place] " [place]." PCP is Dr Doreene Nest; has private insurance with Medicare/ BCBS with prescription drug coverage; pharmacy of choice is [facility]; patient reports no problem getting his medication. No needs identified at this time. CM will continue to follow for DCP;   Expected Discharge Date:   possibly 04/24/2016               Expected Discharge Plan:  Home/Self Care  Discharge planning Services  CM Consult   Status of Service:  In process, will continue to follow  Sherrilyn Rist [id number] 04/22/2016, 2:47 PM

## 2016-04-22 NOTE — Progress Notes (Signed)
Nutrition Brief Note  Nutrition consult received for assessment of nutrition status/recommendations.   82 y.o. male with medical history significant of COPD, achalasia, BPH, CHF, CKD, COPD, esophageal dysmotility, GERD,  HLD, memory loss, MI, PVC, DM, s/p SICD placement presenting after failed outpt therapy for COPD exacerbation.   Wt Readings from Last 15 Encounters:  04/22/16 192 lb 4.8 oz (87.2 kg)  04/11/16 203 lb 3.2 oz (92.2 kg)  11/06/15 201 lb 9.6 oz (91.4 kg)  10/30/15 199 lb 9.6 oz (90.5 kg)  10/14/15 199 lb (90.3 kg)  08/18/15 192 lb (87.1 kg)  07/30/15 190 lb 2 oz (86.2 kg)  07/16/15 190 lb (86.2 kg)  07/06/15 187 lb 7 oz (85 kg)  06/29/15 191 lb 12.8 oz (87 kg)  06/11/15 191 lb 12.8 oz (87 kg)  05/13/15 193 lb 2 oz (87.6 kg)  04/24/15 189 lb 1.6 oz (85.8 kg)  04/11/15 184 lb 4.9 oz (83.6 kg)  03/26/15 184 lb 3.2 oz (83.6 kg)    Body mass index is 29.24 kg/m. Patient meets criteria for Overweight based on current BMI. Current diet order is Regular, patient is consuming approximately 100% of meals at this time. Labs and medications reviewed.  Pt reports having a normal appetite and eating well PTA. He reports losing a few pounds since the Spring which he relates to eating more fresh tomatoes this summer. RD reviewed patient's dietary recall. Pt eats adequate protein, but diet is very low in fruit and low in vegetables. RD recommended a general healthful diet with fruits and vegetables at every meals. Pt likes fruit and feels that he could add more to his meals. RD recommended taking a daily multivitamin if fruit and vegetable intake does not improve. Pt denies any further nutrition questions at this time.   No further nutrition interventions warranted at this time. If nutrition issues arise, please consult RD.   Scarlette Ar RD, CSP, LDN Inpatient Clinical Dietitian Pager: [phone] After Hours Pager: [phone]

## 2016-04-22 NOTE — Progress Notes (Signed)
Echo in process  Definity given by tech   And another nurse , Mendel Ryder  Attempted but not not successful  . Iv team called in  Iv completely removed

## 2016-04-22 NOTE — Progress Notes (Signed)
  Echocardiogram 2D Echocardiogram has been performed. There was an attempt to administer Definity, however, administration was unsuccessful due to IV infiltration.  Diamond Nickel 04/22/2016, 12:58 PM

## 2016-04-22 NOTE — Discharge Summary (Addendum)
Physician Discharge Summary  Antonio Ray [id number] DOB: May 03, 1934 DOA: 04/21/2016  PCP: [name], MD  Admit date: 04/21/2016 Discharge date: 04/22/2016  Time spent: 65 minutes  Recommendations for Outpatient Follow-up:  1. Follow-up with Rexene Edison, NP pulmonary on 05/06/2016.   Discharge Diagnoses:  Principal Problem:   Acute respiratory failure with hypoxemia ([name]) Active Problems:   COPD with acute exacerbation ([name])   Acute on chronic combined systolic and diastolic congestive heart failure, NYHA class 3 ([name])   Hyperlipidemia   GERD (gastroesophageal reflux disease)   Sepsis ([name])   AICD (automatic cardioverter/defibrillator) present   CKD (chronic kidney disease), stage III   Chronic kidney disease, stage III (moderate)   Type 2 diabetes mellitus with complication, with long-term current use of insulin ([name])   Acute respiratory failure ([name])   Chronic combined systolic (congestive) and diastolic (congestive) heart failure ([name])   Memory loss   Discharge Condition: Stable and improved  Diet recommendation: Heart healthy  Filed Weights   04/21/16 1906 04/22/16 0433  Weight: 88.3 kg (194 lb 9.6 oz) 87.2 kg (192 lb 4.8 oz)    History of present illness:  Per Antonio Ray is a 82 y.o. male with medical history significant of COPD, achalasia, BPH, CHF, CKD, COPD, esophageal dysmotility, GERD,  HLD, memory loss, MI, PVC, DM, s/p SICD placement presenting after failed outpt therapy for COPD exacerbation. Cough, wheezing and shortness of breath started on 04/11/2016 after apppointment with his pulmonologist. On 9/13 patient was started on amoxicillin, Mucinex and steroids. Initially improved but 1 day prior to admission, with symptoms worsen significantly again. Inhalers with only slight intermittent improvement. Mild confusion the morning of admission, which quickly resolved.   ED Course: Ambulatory O2 saturation levels less than 90% on room air.  Objective findings outlined below. Placed on O2. Given Rocephin and azithromycin and 125 mg of Solu-Medrol. Hospital Course:  #1 acute respiratory failure with hypoxemia presumably secondary to acute COPD exacerbation  Patient was admitted with acute respiratory failure with hypoxemia felt to be secondary to an acute COPD exacerbation. Patient was noted to have failed outpatient management with steroids and amoxicillin and nebulizers. Chest x-ray which was done was negative for any acute pulmonary process. Patient was noted on admission to have a leukocytosis with a white count of 14.2 with a left shift, tachycardic, tachypnea. Patient was placed on IV Solu-Medrol, DuoNeb nebs, Mucinex, Rocephin and azithromycin. Patient was continued on Spiriva and Dulera and pancultured. Pulmonary was consulted and patient was seen in consultation by his pulmonologist Dr. Lake Bells, changed patient [facility] 2 Brovana and Pulmicort. D-dimer was obtained which was negative. Lower extremity Dopplers also obtained given patient's lower extremity edema which was negative for an acute DVT. Patient improved clinically over the initial 24 hours of his hospitalization and per pulmonary was stable from a pulmonary standpoint to be discharged home. Patient be discharged home on a steroid taper, 5 days of oral azithromycin, resumption of home bronchodilators and will follow-up with pulmonary in the outpatient setting. Patient was discharged in stable and improved condition.  #2 severe chronic systolic and diastolic heart failure Patient remained euvolemic during the hospitalization and was compensated. 2-D echo was obtained the results pending at time of discharge.  #3 chronic kidney disease Remained stable throughout the hospitalization.  #4 diabetes mellitus Patient was maintained on Lantus and sliding scale insulin.  The rest of patient's chronic medical issues remained stable throughout the hospitalization and patient be  discharged in  stable and improved condition.   Procedures:  LE dopplers 04/22/2016  2 d echo 04/22/2016  CXR 04/22/2016  Consultations:  Pulmonary: Dr Lake Bells 04/21/2016  Discharge Exam: Vitals:   04/22/16 0433 04/22/16 1245  BP: (!) 113/46 (!) 140/59  Pulse: 61 73  Resp: 18 18  Temp: 97.5 F (36.4 C) 98.8 F (37.1 C)    General: NAD Cardiovascular: RRR Respiratory: CTAB  Discharge Instructions   Discharge Instructions    Diet - low sodium heart healthy    Complete by:  As directed    Increase activity slowly    Complete by:  As directed      Current Discharge Medication List    START taking these medications   Details  !! albuterol (PROVENTIL HFA;VENTOLIN HFA) 108 (90 Base) MCG/ACT inhaler Inhale 2 puffs into the lungs every 6 (six) hours as needed for wheezing or shortness of breath. Qty: 1 Inhaler, Refills: 2    azithromycin (ZITHROMAX) 500 MG tablet Take 1 tablet (500 mg total) by mouth daily. Take for 5 days then stop. Qty: 5 tablet, Refills: 0    guaiFENesin (MUCINEX) 600 MG 12 hr tablet Take 2 tablets (1,200 mg total) by mouth 2 (two) times daily. Take for 5 days then stop. Qty: 20 tablet, Refills: 0     !! - Potential duplicate medications found. Please discuss with provider.    CONTINUE these medications which have CHANGED   Details  predniSONE (DELTASONE) 10 MG tablet Take 1-3 tablets (10-30 mg total) by mouth daily with breakfast. Take 3 tablets (30mg ) daily x 3 days, then 2 tablets (20mg ) daily x 3 days, then 1 tablet (10mg ) daily x 3 days then stop. Qty: 18 tablet, Refills: 0      CONTINUE these medications which have NOT CHANGED   Details  aspirin 81 MG EC tablet Take 81 mg by mouth daily.      Cholecalciferol (VITAMIN D PO) Take 2,000 Units by mouth daily.     donepezil (ARICEPT) 10 MG tablet Take 1 tablet (10 mg total) by mouth every morning. Qty: 90 tablet, Refills: 3    fenofibrate (TRICOR) 145 MG tablet TAKE 1 TABLET BY MOUTH EVERY  DAY Qty: 90 tablet, Refills: 3    furosemide (LASIX) 20 MG tablet Take 2 tablets (40 mg total) by mouth daily. Qty: 90 tablet, Refills: 3    LANTUS SOLOSTAR 100 UNIT/ML Solostar Pen Inject 37 Units into the skin every morning.  Refills: 11    loratadine (CLARITIN) 10 MG tablet Take 1 tablet (10 mg total) by mouth daily. Qty: 30 tablet, Refills: 1    memantine (NAMENDA) 10 MG tablet Take 1 tablet (10 mg total) by mouth 2 (two) times daily. Qty: 180 tablet, Refills: 3    Multiple Vitamins-Minerals (PRESERVISION AREDS 2) CAPS Take 1 capsule by mouth 2 (two) times daily.    nitroGLYCERIN (NITRODUR - DOSED IN MG/24 HR) 0.2 mg/hr patch Place 1 patch (0.2 mg total) onto the skin daily. Qty: 90 patch, Refills: 3    pantoprazole (PROTONIX) 20 MG tablet Take 1 tablet (20 mg total) by mouth daily. Qty: 30 tablet, Refills: 11    pravastatin (PRAVACHOL) 40 MG tablet TAKE 1 TABLET(40 MG) BY MOUTH EVERY EVENING Qty: 30 tablet, Refills: 11    !! PROVENTIL HFA 108 (90 BASE) MCG/ACT inhaler Inhale 2 puffs into the lungs every 6 (six) hours as needed for wheezing or shortness of breath. As directed    SPIRIVA HANDIHALER 18 MCG inhalation capsule  INHALE CONTENTS OF 1 CAPSULE VIA HANDIHALER ONCE DAILY Qty: 30 capsule, Refills: 5    SYMBICORT 160-4.5 MCG/ACT inhaler INHALE 2 PUFFS BY MOUTH TWICE DAILY Qty: 10.2 g, Refills: 6     !! - Potential duplicate medications found. Please discuss with provider.    STOP taking these medications     amoxicillin (AMOXIL) 875 MG tablet        Allergies  Allergen Reactions  . Codeine Other (See Comments)    Gets very angry, disoriented  . Dilaudid [Hydromorphone Hcl] Other (See Comments)    VERY AGITATED, HOSTILE  . Flomax [Tamsulosin Hcl] Shortness Of Breath  . Morphine And Related Other (See Comments)    VERY AGITATED, HOSTILE  . Sulfa Antibiotics Shortness Of Breath  . Beta Adrenergic Blockers Other (See Comments)    ? disorientation  .  Carvedilol Other (See Comments)    DISORIENTATION   Follow-up Information    Rexene Edison, NP Follow up on 05/06/2016.   Specialty:  Pulmonary Disease Why:  f/u at 1145am Contact information: [address] [place] [address] [phone]            The results of significant diagnostics from this hospitalization (including imaging, microbiology, ancillary and laboratory) are listed below for reference.    Significant Diagnostic Studies: Dg Chest 2 View  Result Date: 04/22/2016 CLINICAL DATA:  Respiratory failure. EXAM: CHEST  2 VIEW COMPARISON:  04/21/2016. FINDINGS: Cardiac pacer with lead tips in right atrium right ventricle. Cardiomegaly with normal pulmonary vascularity. Low lung volumes with mild bibasilar atelectasis. No pleural effusion or pneumothorax . IMPRESSION: 1. Cardiac pacer with lead tips in right atrium right ventricle. Cardiomegaly with normal pulmonary vascularity. 2. Low lung volumes with mild bibasilar atelectasis. Electronically Signed   By: Marcello Moores  Register   On: 04/22/2016 09:57   Dg Chest 2 View  Result Date: 04/21/2016 CLINICAL DATA:  Shortness of Breath EXAM: CHEST  2 VIEW COMPARISON:  07/13/2015 FINDINGS: There is a left chest wall pacer device with lead in the right atrial appendage and right ventricle. Heart size is normal. No pleural effusion or edema. No airspace consolidation identified. The visualized osseous structures are unremarkable. IMPRESSION: 1. No active cardiopulmonary abnormalities. Electronically Signed   By: Kerby Moors M.D.   On: 04/21/2016 10:24    Microbiology: No results found for this or any previous visit (from the past 240 hour(s)).   Labs: Basic Metabolic Panel:  Recent Labs Lab 04/21/16 0844 04/21/16 1516 04/21/16 2318 04/22/16 0335  NA 139  --  134* 137  K 4.7  --  4.7 4.4  CL 104  --  102 104  CO2 21*  --  24 25  GLUCOSE 242*  --  583* 367*  BUN 49*  --  49* 47*  CREATININE 1.87* 2.16* 2.02* 1.74*   CALCIUM 9.1  --  8.5* 8.7*   Liver Function Tests:  Recent Labs Lab 04/21/16 0844 04/22/16 0335  AST 27 18  ALT 23 20  ALKPHOS 46 43  BILITOT 0.7 0.6  PROT 6.4* 5.7*  ALBUMIN 3.2* 2.8*   No results for input(s): LIPASE, AMYLASE in the last 168 hours. No results for input(s): AMMONIA in the last 168 hours. CBC:  Recent Labs Lab 04/21/16 0844 04/22/16 0335  WBC 14.2* 10.8*  NEUTROABS 9.5*  --   HGB 15.3 13.1  HCT 49.1 42.0  MCV 88.2 87.9  PLT 197 215   Cardiac Enzymes:  Recent Labs Lab 04/21/16 1036 04/21/16  1516 04/21/16 1922  TROPONINI 0.03* 0.04* 0.04*   BNP: BNP (last 3 results)  Recent Labs  05/12/15 0853 06/29/15 1845 04/21/16 0844  BNP 136.5* 104.3* 154.2*    ProBNP (last 3 results) No results for input(s): PROBNP in the last 8760 hours.  CBG:  Recent Labs Lab 04/21/16 2141 04/21/16 2307 04/22/16 0054 04/22/16 0533 04/22/16 1048  GLUCAP 576* 546* 470* 270* 179*       Signed:  Yousof Alderman MD.  [facility] 04/22/2016, 4:19 PM

## 2016-04-26 LAB — CULTURE, BLOOD (ROUTINE X 2)
CULTURE: NO GROWTH
Culture: NO GROWTH

## 2016-04-27 DIAGNOSIS — L603 Nail dystrophy: Secondary | ICD-10-CM | POA: Diagnosis not present

## 2016-04-27 DIAGNOSIS — I739 Peripheral vascular disease, unspecified: Secondary | ICD-10-CM | POA: Diagnosis not present

## 2016-04-27 DIAGNOSIS — E1051 Type 1 diabetes mellitus with diabetic peripheral angiopathy without gangrene: Secondary | ICD-10-CM | POA: Diagnosis not present

## 2016-04-27 DIAGNOSIS — L84 Corns and callosities: Secondary | ICD-10-CM | POA: Diagnosis not present

## 2016-05-06 ENCOUNTER — Encounter: Payer: Self-pay | Admitting: Adult Health

## 2016-05-06 ENCOUNTER — Ambulatory Visit (INDEPENDENT_AMBULATORY_CARE_PROVIDER_SITE_OTHER): Payer: Medicare Other | Admitting: Adult Health

## 2016-05-06 DIAGNOSIS — J441 Chronic obstructive pulmonary disease with (acute) exacerbation: Secondary | ICD-10-CM | POA: Diagnosis not present

## 2016-05-06 DIAGNOSIS — I255 Ischemic cardiomyopathy: Secondary | ICD-10-CM | POA: Diagnosis not present

## 2016-05-06 NOTE — Progress Notes (Signed)
Subjective:    Patient ID: Antonio Ray, male    DOB: 1933-11-06, 82 y.o.   MRN: [id number]  HPI 82 yo male former smoker followed for GOLD IV COPD (FEV1 29%)  Hx of achalasia w/ previous recurrent HCAP. S/p botox injections in distal esophagus late 2016   05/06/2016 Post hospital follow up : COPD  Pt returns for a post hospital follow up . He was admitted last month for COPD exacerbation  And decompensated combined CHF . He was treated with IV abx, and steroids  . Transitioned to  Zpack at discharge and steroid taper. Venous dopplers were neg.  Since discharge he is feeling better but remains with low energy. Gets winded with walking at times.  Remains on Symbicort and Spiriva .  He denies chest pain, orthopnea , edema or fever.  BS were very high during hospital stay. Discussed need for follow up with PCP to discuss his DM control .  Pt education on steroids   Past Medical History:  Diagnosis Date  . Achalasia   . AICD (automatic cardioverter/defibrillator) present 03/30/2011   Analyze ST study patient  . Anginal pain ([place])   . BPH (benign prostatic hyperplasia)   . CHF (congestive heart failure) ([place])   . Chronic systolic dysfunction of left ventricle    EF 30-35%, CLASS II - III SYMPTOMS; intolerant to Coreg  . CKD (chronic kidney disease) stage 3, GFR 30-59 ml/min 06/08/2012  . COPD (chronic obstructive pulmonary disease) ([place])   . Coronary artery disease    History of remote anterior MI with PCI to LAD in 2006; most recent cath 2007, no intervention required  . DOE (dyspnea on exertion)    with heavy exertion  . Dysrhythmia   . Esophageal dysmotility   . GERD (gastroesophageal reflux disease)   . Head injury, closed, with concussion 2000ish  . Heart murmur    hx  . HOH (hard of hearing)   . Hyperlipidemia   . Memory loss    improved  . Myocardial infarction 1994   ANTERIOR  . Pneumonia "several times"   aspiration pna with at least 3 admits for this 2016.   Marland Kitchen  PVC's (premature ventricular contractions)   . Renal failure   . Skin cancer "several"   "forearms; head"  . Type II diabetes mellitus ([place])    Current Outpatient Prescriptions on File Prior to Visit  Medication Sig Dispense Refill  . albuterol (PROVENTIL HFA;VENTOLIN HFA) 108 (90 Base) MCG/ACT inhaler Inhale 2 puffs into the lungs every 6 (six) hours as needed for wheezing or shortness of breath. 1 Inhaler 2  . aspirin 81 MG EC tablet Take 81 mg by mouth daily.      . Cholecalciferol (VITAMIN D PO) Take 2,000 Units by mouth daily.     Marland Kitchen donepezil (ARICEPT) 10 MG tablet Take 1 tablet (10 mg total) by mouth every morning. 90 tablet 3  . fenofibrate (TRICOR) 145 MG tablet TAKE 1 TABLET BY MOUTH EVERY DAY 90 tablet 3  . furosemide (LASIX) 20 MG tablet Take 2 tablets (40 mg total) by mouth daily. 90 tablet 3  . LANTUS SOLOSTAR 100 UNIT/ML Solostar Pen Inject 38 Units into the skin every morning.   11  . loratadine (CLARITIN) 10 MG tablet Take 1 tablet (10 mg total) by mouth daily. 30 tablet 1  . memantine (NAMENDA) 10 MG tablet Take 1 tablet (10 mg total) by mouth 2 (two) times daily. 180 tablet 3  . Multiple  Vitamins-Minerals (PRESERVISION AREDS 2) CAPS Take 1 capsule by mouth 2 (two) times daily.    . nitroGLYCERIN (NITRODUR - DOSED IN MG/24 HR) 0.2 mg/hr patch Place 1 patch (0.2 mg total) onto the skin daily. 90 patch 3  . pantoprazole (PROTONIX) 20 MG tablet Take 1 tablet (20 mg total) by mouth daily. 30 tablet 11  . pravastatin (PRAVACHOL) 40 MG tablet TAKE 1 TABLET(40 MG) BY MOUTH EVERY EVENING 30 tablet 11  . SPIRIVA HANDIHALER 18 MCG inhalation capsule INHALE CONTENTS OF 1 CAPSULE VIA HANDIHALER ONCE DAILY 30 capsule 5  . SYMBICORT 160-4.5 MCG/ACT inhaler INHALE 2 PUFFS BY MOUTH TWICE DAILY 10.2 g 6   No current facility-administered medications on file prior to visit.      Review of Systems Constitutional:   No  weight loss, night sweats,  Fevers, chills,  +fatigue, or   lassitude.  HEENT:   No headaches,  Difficulty swallowing,  Tooth/dental problems, or  Sore throat,                No sneezing, itching, ear ache,  +nasal congestion, post nasal drip,   CV:  No chest pain,  Orthopnea, PND, swelling in lower extremities, anasarca, dizziness, palpitations, syncope.   GI  No heartburn, indigestion, abdominal pain, nausea, vomiting, diarrhea, change in bowel habits, loss of appetite, bloody stools.   Resp:  .  No chest wall deformity  Skin: no rash or lesions.  GU: no dysuria, change in color of urine, no urgency or frequency.  No flank pain, no hematuria   MS:  No joint pain or swelling.  No decreased range of motion.  No back pain.  Psych:  No change in mood or affect. No depression or anxiety.  No memory loss.         Objective:   Physical Exam Vitals:   05/06/16 1131  BP: 102/66  Pulse: 68  Temp: 97.7 F (36.5 C)  TempSrc: Oral  SpO2: 99%  Weight: 196 lb (88.9 kg)  Height: 5\' 8"  (1.727 m)   GEN: A/Ox3; pleasant , NAD elderly    HEENT:  [place]/AT,  EACs-clear, TMs-wnl, NOSE-clear, THROAT-clear, no lesions, no postnasal drip or exudate noted.   NECK:  Supple w/ fair ROM; no JVD; normal carotid impulses w/o bruits; no thyromegaly or nodules palpated; no lymphadenopathy.    RESP  Decreased BS in bases  , no accessory muscle use, no dullness to percussion  CARD:  RRR, no m/r/g  , tr peripheral edema, pulses intact, no cyanosis or clubbing.  GI:   Soft & nt; nml bowel sounds; no organomegaly or masses detected.   Musco: Warm bil, no deformities or joint swelling noted.   Neuro: alert, no focal deficits noted.    Skin: Warm, no lesions or rashes  Lima Chillemi NP-C  [facility]  05/06/2016        Assessment & Plan:

## 2016-05-06 NOTE — Assessment & Plan Note (Signed)
Controlled   Plan  . Patient Instructions  Continue on Symbicort and Spiriva.  Continue on current regimen  Follow up with  Dr. Lake Bells in 3 months and As needed   Please contact office for sooner follow up if symptoms do not improve or worsen or seek emergency care  Follow up with primary MD for labs and Diabetes.

## 2016-05-06 NOTE — Patient Instructions (Signed)
Continue on Symbicort and Spiriva.  Continue on current regimen  Follow up with  Dr. Lake Bells in 3 months and As needed   Please contact office for sooner follow up if symptoms do not improve or worsen or seek emergency care  Follow up with primary MD for labs and Diabetes.

## 2016-05-09 ENCOUNTER — Ambulatory Visit (INDEPENDENT_AMBULATORY_CARE_PROVIDER_SITE_OTHER): Payer: Medicare Other | Admitting: *Deleted

## 2016-05-09 DIAGNOSIS — I255 Ischemic cardiomyopathy: Secondary | ICD-10-CM

## 2016-05-09 NOTE — Progress Notes (Signed)
Remote ICD transmission.   

## 2016-05-10 ENCOUNTER — Encounter: Payer: Self-pay | Admitting: Cardiology

## 2016-05-11 DIAGNOSIS — Z23 Encounter for immunization: Secondary | ICD-10-CM | POA: Diagnosis not present

## 2016-05-11 DIAGNOSIS — D044 Carcinoma in situ of skin of scalp and neck: Secondary | ICD-10-CM | POA: Diagnosis not present

## 2016-05-12 NOTE — Assessment & Plan Note (Signed)
Recent flare requiring hospitalization -improving with abx and steroids   Plan  Patient Instructions  Continue on Symbicort and Spiriva.  Continue on current regimen  Follow up with  Dr. Lake Bells in 3 months and As needed   Please contact office for sooner follow up if symptoms do not improve or worsen or seek emergency care  Follow up with primary MD for labs and Diabetes.

## 2016-05-23 DIAGNOSIS — Z0000 Encounter for general adult medical examination without abnormal findings: Secondary | ICD-10-CM | POA: Diagnosis not present

## 2016-05-23 DIAGNOSIS — J449 Chronic obstructive pulmonary disease, unspecified: Secondary | ICD-10-CM | POA: Diagnosis not present

## 2016-05-23 DIAGNOSIS — E1142 Type 2 diabetes mellitus with diabetic polyneuropathy: Secondary | ICD-10-CM | POA: Diagnosis not present

## 2016-05-23 DIAGNOSIS — N400 Enlarged prostate without lower urinary tract symptoms: Secondary | ICD-10-CM | POA: Diagnosis not present

## 2016-05-23 DIAGNOSIS — Z794 Long term (current) use of insulin: Secondary | ICD-10-CM | POA: Diagnosis not present

## 2016-05-30 ENCOUNTER — Other Ambulatory Visit: Payer: Self-pay | Admitting: Pulmonary Disease

## 2016-06-03 LAB — CUP PACEART REMOTE DEVICE CHECK
HIGH POWER IMPEDANCE MEASURED VALUE: 87 Ohm
Implantable Lead Implant Date: 20120829
Implantable Lead Location: 753860
Implantable Pulse Generator Implant Date: 20120829
Lead Channel Impedance Value: 400 Ohm
Lead Channel Pacing Threshold Amplitude: 0.5 V
Lead Channel Pacing Threshold Amplitude: 1 V
Lead Channel Pacing Threshold Pulse Width: 0.5 ms
Lead Channel Sensing Intrinsic Amplitude: 12 mV
MDC IDC LEAD IMPLANT DT: 20120829
MDC IDC LEAD LOCATION: 753859
MDC IDC MSMT BATTERY REMAINING LONGEVITY: 42 mo
MDC IDC MSMT LEADCHNL RA IMPEDANCE VALUE: 400 Ohm
MDC IDC MSMT LEADCHNL RA SENSING INTR AMPL: 1.1 mV
MDC IDC MSMT LEADCHNL RV PACING THRESHOLD PULSEWIDTH: 0.5 ms
MDC IDC SESS DTM: 20171103112510
MDC IDC STAT BRADY RA PERCENT PACED: 39 %
MDC IDC STAT BRADY RV PERCENT PACED: 1 % — AB
Pulse Gen Serial Number: 828604

## 2016-06-20 NOTE — Progress Notes (Signed)
Antonio Ray Date of Birth: Aug 05, 1933 Medical Record #[id number]  History of Present Illness: Antonio Ray is seen for follow up CAD and CHF.  He is seen with his son. He has a history of coronary disease with remote myocardial infarction in 1994. He had emergent angioplasty of the LAD. Cardiac catheterization 2007 showed nonobstructive disease. He has an ischemic cardiomyopathy with ejection fraction of 30-35%. His last Myoview in 2011 showed an anterior wall scar without ischemia. He has an ICD in place. Most recent Echo in September 2016 showed EF had dropped to 10-15% with global hypokinesis and akinesis of the anteroseptum.  He also has a history of recurrent aspiration PNA.   He had a barium swallow that showed achalasia. Apparently manography confirmed this. He has had esophageal dilation. He has a history of memory loss and is now on Aricept and Namenda. He was admitted 9/21-9/22/17 with acute COPD exacerbation treated with Nebs, steroids, and antibiotics. Echo showed EF 20-25%. No acute CHF.   On follow up today he states breathing is OK. No cough. No chest pain. Blood sugars have been under control. No increased swelling. He had one bad case of indigestion one night that resolved with belching. He is walking 1-1.5 miles per day.   Current Outpatient Prescriptions on File Prior to Visit  Medication Sig Dispense Refill  . albuterol (PROVENTIL HFA;VENTOLIN HFA) 108 (90 Base) MCG/ACT inhaler Inhale 2 puffs into the lungs every 6 (six) hours as needed for wheezing or shortness of breath. 1 Inhaler 2  . aspirin 81 MG EC tablet Take 81 mg by mouth daily.      . Cholecalciferol (VITAMIN D PO) Take 2,000 Units by mouth daily.     Marland Kitchen donepezil (ARICEPT) 10 MG tablet Take 1 tablet (10 mg total) by mouth every morning. 90 tablet 3  . fenofibrate (TRICOR) 145 MG tablet TAKE 1 TABLET BY MOUTH EVERY DAY 90 tablet 3  . furosemide (LASIX) 20 MG tablet Take 2 tablets (40 mg total) by mouth daily. 90  tablet 3  . LANTUS SOLOSTAR 100 UNIT/ML Solostar Pen Inject 30 Units into the skin every morning.   11  . loratadine (CLARITIN) 10 MG tablet Take 1 tablet (10 mg total) by mouth daily. 30 tablet 1  . memantine (NAMENDA) 10 MG tablet Take 1 tablet (10 mg total) by mouth 2 (two) times daily. 180 tablet 3  . Multiple Vitamins-Minerals (PRESERVISION AREDS 2) CAPS Take 1 capsule by mouth 2 (two) times daily.    . nitroGLYCERIN (NITRODUR - DOSED IN MG/24 HR) 0.2 mg/hr patch Place 1 patch (0.2 mg total) onto the skin daily. 90 patch 3  . pantoprazole (PROTONIX) 20 MG tablet Take 1 tablet (20 mg total) by mouth daily. 30 tablet 11  . pravastatin (PRAVACHOL) 40 MG tablet TAKE 1 TABLET(40 MG) BY MOUTH EVERY EVENING 30 tablet 11  . SPIRIVA HANDIHALER 18 MCG inhalation capsule INHALE CONTENTS OF 1 CAPSULE VIA HANDIHALER ONCE DAILY 30 capsule 5  . SYMBICORT 160-4.5 MCG/ACT inhaler INHALE 2 PUFFS BY MOUTH TWICE DAILY 10.2 g 5   No current facility-administered medications on file prior to visit.     Allergies  Allergen Reactions  . Codeine Other (See Comments)    Gets very angry, disoriented  . Dilaudid [Hydromorphone Hcl] Other (See Comments)    VERY AGITATED, HOSTILE  . Flomax [Tamsulosin Hcl] Shortness Of Breath  . Morphine And Related Other (See Comments)    VERY AGITATED, HOSTILE  .  Sulfa Antibiotics Shortness Of Breath  . Beta Adrenergic Blockers Other (See Comments)    ? disorientation  . Carvedilol Other (See Comments)    DISORIENTATION    Past Medical History:  Diagnosis Date  . Achalasia   . AICD (automatic cardioverter/defibrillator) present 03/30/2011   Analyze ST study patient  . Anginal pain ([place])   . BPH (benign prostatic hyperplasia)   . CHF (congestive heart failure) ([place])   . Chronic systolic dysfunction of left ventricle    EF 30-35%, CLASS II - III SYMPTOMS; intolerant to Coreg  . CKD (chronic kidney disease) stage 3, GFR 30-59 ml/min 06/08/2012  . COPD (chronic  obstructive pulmonary disease) ([place])   . Coronary artery disease    History of remote anterior MI with PCI to LAD in 2006; most recent cath 2007, no intervention required  . DOE (dyspnea on exertion)    with heavy exertion  . Dysrhythmia   . Esophageal dysmotility   . GERD (gastroesophageal reflux disease)   . Head injury, closed, with concussion 2000ish  . Heart murmur    hx  . HOH (hard of hearing)   . Hyperlipidemia   . Memory loss    improved  . Myocardial infarction 1994   ANTERIOR  . Pneumonia "several times"   aspiration pna with at least 3 admits for this 2016.   Marland Kitchen PVC's (premature ventricular contractions)   . Renal failure   . Skin cancer "several"   "forearms; head"  . Type II diabetes mellitus ([place])     Past Surgical History:  Procedure Laterality Date  . BALLOON DILATION N/A 09/09/2015   Procedure: BALLOON DILATION;  Surgeon: Mauri Pole, MD;  Location: [facility];  Service: Endoscopy;  Laterality: N/A;  rigiflex achalasia balloon dilators  . BOTOX INJECTION N/A 04/08/2015   Procedure: BOTOX INJECTION;  Surgeon: Milus Banister, MD;  Location: [facility];  Service: Endoscopy;  Laterality: N/A;  . CARDIAC CATHETERIZATION  01/11/2006   DEMONSTRATES AKINESIA OF THE DISTAL ANTERIOR WALL, DISTAL INFERIOR WALL AND AKINESIA OF THE APEX. THE BASAL SEGMENTS CONTRACT WELL AND OVERALL EF 35%  . CATARACT EXTRACTION W/ INTRAOCULAR LENS  IMPLANT, BILATERAL  08/2014-09/2014  . CHOLECYSTECTOMY  2/11  . CORONARY ANGIOPLASTY  1994   TO THE LAD  . ESOPHAGEAL MANOMETRY N/A 03/16/2015   Procedure: ESOPHAGEAL MANOMETRY (EM);  Surgeon: Jerene Bears, MD;  Location: [facility];  Service: Gastroenterology;  Laterality: N/A;  . ESOPHAGOGASTRODUODENOSCOPY N/A 04/08/2015   Procedure: ESOPHAGOGASTRODUODENOSCOPY (EGD);  Surgeon: Milus Banister, MD;  Location: [facility];  Service: Endoscopy;  Laterality: N/A;  . ESOPHAGOGASTRODUODENOSCOPY (EGD) WITH PROPOFOL N/A 09/09/2015   Procedure:  ESOPHAGOGASTRODUODENOSCOPY (EGD) WITH PROPOFOL;  Surgeon: Mauri Pole, MD;  Location: [facility];  Service: Endoscopy;  Laterality: N/A;  pt. is to have gastrografin xray post recovery-do not discharge until results are back  . FOOT FRACTURE SURGERY Right 1980's  . INSERT / REPLACE / REMOVE PACEMAKER    . SKIN CANCER EXCISION  "several"   "forearms, head"    History  Smoking Status  . Former Smoker  . Packs/day: 1.00  . Years: 35.00  . Types: Cigarettes  . Quit date: 08/01/1992  Smokeless Tobacco  . Never Used    History  Alcohol Use  . 0.0 oz/week    Comment: Approximately a beer every 2 years    Family History  Problem Relation Age of Onset  . Stroke Father     Mother history unknown - never  met her  . Colon cancer Neg Hx     Review of Systems: As noted in history of present illness. All other systems were reviewed and are negative.  Physical Exam: BP 106/60 (BP Location: Left Arm, Patient Position: Sitting, Cuff Size: Normal)   Pulse 72   Ht _0  (1.727 m)   Wt 200 lb 3.2 oz (90.8 kg)   BMI 30.44 kg/m  Patient is very pleasant and in no acute distress. Skin is warm and dry. Color is normal.  HEENT is unremarkable. Normocephalic/atraumatic. PERRL. Sclera are nonicteric. Neck is supple. No masses. No JVD. Lungs are clear. Cardiac exam shows a regular rate and rhythm. Abdomen is soft and obese. No masses or bruits . Extremities are without edema.  pedal pulses are good. Gait and ROM are intact. No gross neurologic deficits noted.   LABORATORY DATA: Lab Results  Component Value Date   WBC 10.8 (H) 04/22/2016   HGB 13.1 04/22/2016   HCT 42.0 04/22/2016   PLT 215 04/22/2016   GLUCOSE 367 (H) 04/22/2016   CHOL 136 06/20/2013   TRIG 142.0 06/20/2013   HDL 32.00 (L) 06/20/2013   LDLCALC 76 06/20/2013   ALT 20 04/22/2016   AST 18 04/22/2016   NA 137 04/22/2016   K 4.4 04/22/2016   CL 104 04/22/2016   CREATININE 1.74 (H) 04/22/2016   BUN 47 (H) 04/22/2016    CO2 25 04/22/2016   INR 1.27 03/31/2015   HGBA1C 10.2 (H) 04/21/2016   Labs dated 02/05/16: cholesterol 144, triglycerides 223, HDL 40, LDL 59.   BNP 11/17: 154  Echo: 04/22/16: Study Conclusions  - Left ventricle: The cavity size was normal. There was moderate   concentric hypertrophy. Systolic function was severely reduced.   The estimated ejection fraction was in the range of 20% to 25%.   Wall motion was normal; there were no regional wall motion   abnormalities. Doppler parameters are consistent with abnormal   left ventricular relaxation (grade 1 diastolic dysfunction).   Doppler parameters are consistent with elevated ventricular   end-diastolic filling pressure. - Aortic valve: There was trivial regurgitation. - Aortic root: The aortic root was normal in size. - Mitral valve: Structurally normal valve. There was mild   regurgitation. - Left atrium: The atrium was mildly dilated. - Right ventricle: Pacer wire or catheter noted in right ventricle.   Systolic function was normal. - Right atrium: Pacer wire or catheter noted in right atrium. - Tricuspid valve: There was trivial regurgitation. - Pulmonic valve: There was no regurgitation. - Pulmonary arteries: Systolic pressure was within the normal   range. - Inferior vena cava: The vessel was normal in size. - Pericardium, extracardiac: There was no pericardial effusion.  Impressions:  - There is akinesis of the basal and mid anterior, anteroseptal,   inferoseptal walls, mid inferior wall and dyskinesis of the   apical anterior, septal and inferior walls including the true   septum. LVEF has slightly improved from the study on 04/02/2015,   now 20-25%, previously 10-15%.    Assessment / Plan: 1. Coronary disease with remote anterior myocardial infarction in 1994. Cardiac catheterization 2007 showed nonobstructive disease. His last stress test in October 2011 showed no ischemia. We will continue with his current  medical therapy including aspirin. He has a history of intolerance to carvedilol  because of dizziness. On nitrodur patch.  2. Ischemic cardiomyopathy with chronic systolic CHF. Last EF 20-25%. He is asymptomatic. He appears to be euvolemic. We'll continue  with his current diuretic dose.   3. Prophylactic ICD.  4. Hyperlipidemia.   5. Chronic kidney disease stage III.   6. Recurrent PNA- possibly related to aspiration. Followed by pulmonary and GI.  7. Achalasia. Per GI. S/p esophageal dilation.  8. COPD

## 2016-06-21 ENCOUNTER — Ambulatory Visit (INDEPENDENT_AMBULATORY_CARE_PROVIDER_SITE_OTHER): Payer: Medicare Other | Admitting: Cardiology

## 2016-06-21 ENCOUNTER — Encounter: Payer: Self-pay | Admitting: Cardiology

## 2016-06-21 VITALS — BP 106/60 | HR 72 | Ht 68.0 in | Wt 200.2 lb

## 2016-06-21 DIAGNOSIS — E7800 Pure hypercholesterolemia, unspecified: Secondary | ICD-10-CM

## 2016-06-21 DIAGNOSIS — I2510 Atherosclerotic heart disease of native coronary artery without angina pectoris: Secondary | ICD-10-CM | POA: Diagnosis not present

## 2016-06-21 DIAGNOSIS — I255 Ischemic cardiomyopathy: Secondary | ICD-10-CM | POA: Diagnosis not present

## 2016-06-21 DIAGNOSIS — Z794 Long term (current) use of insulin: Secondary | ICD-10-CM | POA: Diagnosis not present

## 2016-06-21 DIAGNOSIS — I5042 Chronic combined systolic (congestive) and diastolic (congestive) heart failure: Secondary | ICD-10-CM

## 2016-06-21 DIAGNOSIS — E1142 Type 2 diabetes mellitus with diabetic polyneuropathy: Secondary | ICD-10-CM | POA: Diagnosis not present

## 2016-06-21 DIAGNOSIS — J449 Chronic obstructive pulmonary disease, unspecified: Secondary | ICD-10-CM

## 2016-06-21 DIAGNOSIS — I495 Sick sinus syndrome: Secondary | ICD-10-CM

## 2016-06-21 DIAGNOSIS — Z9861 Coronary angioplasty status: Secondary | ICD-10-CM

## 2016-06-21 DIAGNOSIS — I10 Essential (primary) hypertension: Secondary | ICD-10-CM | POA: Diagnosis not present

## 2016-06-21 NOTE — Patient Instructions (Signed)
Continue your current therapy  I will see you in 6 months.   

## 2016-06-27 ENCOUNTER — Other Ambulatory Visit: Payer: Self-pay | Admitting: Cardiology

## 2016-06-27 NOTE — Telephone Encounter (Signed)
REFILL 

## 2016-07-19 ENCOUNTER — Encounter: Payer: Self-pay | Admitting: Internal Medicine

## 2016-07-19 ENCOUNTER — Ambulatory Visit (INDEPENDENT_AMBULATORY_CARE_PROVIDER_SITE_OTHER): Payer: Medicare Other | Admitting: Internal Medicine

## 2016-07-19 VITALS — BP 102/66 | HR 77 | Ht 68.0 in | Wt 198.8 lb

## 2016-07-19 DIAGNOSIS — E1165 Type 2 diabetes mellitus with hyperglycemia: Secondary | ICD-10-CM

## 2016-07-19 DIAGNOSIS — E1159 Type 2 diabetes mellitus with other circulatory complications: Secondary | ICD-10-CM | POA: Diagnosis not present

## 2016-07-19 DIAGNOSIS — I255 Ischemic cardiomyopathy: Secondary | ICD-10-CM

## 2016-07-19 LAB — POCT GLYCOSYLATED HEMOGLOBIN (HGB A1C): HEMOGLOBIN A1C: 9.9

## 2016-07-19 MED ORDER — BASAGLAR KWIKPEN 100 UNIT/ML ~~LOC~~ SOPN: 30.0000 [IU] | PEN_INJECTOR | Freq: Every day | SUBCUTANEOUS | 5 refills | Status: DC

## 2016-07-19 MED ORDER — INSULIN ASPART 100 UNIT/ML FLEXPEN: 7.0000 [IU] | PEN_INJECTOR | Freq: Three times a day (TID) | SUBCUTANEOUS | 5 refills | Status: DC

## 2016-07-19 MED ORDER — INSULIN PEN NEEDLE 32G X 5 MM MISC: 5 refills | Status: DC

## 2016-07-19 NOTE — Progress Notes (Signed)
Patient ID: Antonio Ray, male   DOB: September 14, 1933, 82 y.o.   MRN: [id number]   HPI: Antonio Ray is a 82 y.o.-year-old male, referred by his PCP, [name], MD, for management of DM2, dx in 2010, insulin-dependent since ~2012, uncontrolled, with complications (PN, CKD stage 3-4, CAD, CHF - ICD). He is here with his son who offers part of the history.  Last hemoglobin A1c was: Lab Results  Component Value Date   HGBA1C 10.2 (H) 04/21/2016   HGBA1C 8.5 (H) 03/22/2015   HGBA1C 7.6 (H) 01/14/2015   He had a lot of steroids for COPD. He had a severe PNA episode in 2016 2/2 aspiration 2/2 achalasia. He had several admissions >> on repeated Solumedrol doses. Since then >> occasional COPD exacerbations >> Solumedrol + prednisone tapers - not in last 3 months.  Pt is on a regimen of: - Basaglar 29-32-38 units in am  Pt checks his sugars 1-3 a day and they are: - am: 118-189, 222 - 2h after b'fast: n/c - before lunch: 175-260 - 2h after lunch: n/c - before dinner: 144, 159-287 - 2h after dinner: n/c - bedtime: n/c - nighttime: n/c No lows. Lowest sugar was 99. Highest sugar was HI >> 287.  Glucometer: Freestyle Lite  Pt's meals are: - Breakfast: bacon, eggs, hash browns - Lunch: sandwich or soup - Dinner: varies, meat + veggies - Snacks: 1-2  He walks daily.  - no CKD, last BUN/creatinine:  Lab Results  Component Value Date   BUN 47 (H) 04/22/2016   BUN 49 (H) 04/21/2016   CREATININE 1.74 (H) 04/22/2016   CREATININE 2.02 (H) 04/21/2016   - last set of lipids: Lab Results  Component Value Date   CHOL 136 06/20/2013   HDL 32.00 (L) 06/20/2013   LDLCALC 76 06/20/2013   TRIG 142.0 06/20/2013   CHOLHDL 4 06/20/2013   - last eye exam was in 03/17/2016. Dr. Marica Otter. ? DR. Had cataract Sx last year. + macular degeneration.Has a foot ulcer 11/2014 >> healed well (2016). He has DM shoes. He has a podiatrist. - no numbness and tingling in his  feet.  ROS: Constitutional: no weight gain/loss, no fatigue, no subjective hyperthermia/hypothermia, + nocturia Eyes: no blurry vision, no xerophthalmia ENT: no sore throat, no nodules palpated in throat, no dysphagia/odynophagia, no hoarseness Cardiovascular: no CP/SOB/palpitations/leg swelling Respiratory: no cough/SOB/+ wheezing Gastrointestinal: no N/V/D/C Musculoskeletal: no muscle/+ joint aches Skin: no rashes Neurological: no tremors/numbness/tingling/dizziness Psychiatric: no depression/anxiety  Past Medical History:  Diagnosis Date  . Achalasia   . AICD (automatic cardioverter/defibrillator) present 03/30/2011   Analyze ST study patient  . Anginal pain ([place])   . BPH (benign prostatic hyperplasia)   . CHF (congestive heart failure) ([place])   . Chronic systolic dysfunction of left ventricle    EF 30-35%, CLASS II - III SYMPTOMS; intolerant to Coreg  . CKD (chronic kidney disease) stage 3, GFR 30-59 ml/min 06/08/2012  . COPD (chronic obstructive pulmonary disease) ([place])   . Coronary artery disease    History of remote anterior MI with PCI to LAD in 2006; most recent cath 2007, no intervention required  . DOE (dyspnea on exertion)    with heavy exertion  . Dysrhythmia   . Esophageal dysmotility   . GERD (gastroesophageal reflux disease)   . Head injury, closed, with concussion 2000ish  . Heart murmur    hx  . HOH (hard of hearing)   . Hyperlipidemia   . Memory loss  improved  . Myocardial infarction 1994   ANTERIOR  . Pneumonia "several times"   aspiration pna with at least 3 admits for this 2016.   Marland Kitchen PVC's (premature ventricular contractions)   . Renal failure   . Skin cancer "several"   "forearms; head"  . Type II diabetes mellitus ([place])    Past Surgical History:  Procedure Laterality Date  . BALLOON DILATION N/A 09/09/2015   Procedure: BALLOON DILATION;  Surgeon: Mauri Pole, MD;  Location: [facility];  Service: Endoscopy;  Laterality: N/A;  rigiflex  achalasia balloon dilators  . BOTOX INJECTION N/A 04/08/2015   Procedure: BOTOX INJECTION;  Surgeon: Milus Banister, MD;  Location: [facility];  Service: Endoscopy;  Laterality: N/A;  . CARDIAC CATHETERIZATION  01/11/2006   DEMONSTRATES AKINESIA OF THE DISTAL ANTERIOR WALL, DISTAL INFERIOR WALL AND AKINESIA OF THE APEX. THE BASAL SEGMENTS CONTRACT WELL AND OVERALL EF 35%  . CATARACT EXTRACTION W/ INTRAOCULAR LENS  IMPLANT, BILATERAL  08/2014-09/2014  . CHOLECYSTECTOMY  2/11  . CORONARY ANGIOPLASTY  1994   TO THE LAD  . ESOPHAGEAL MANOMETRY N/A 03/16/2015   Procedure: ESOPHAGEAL MANOMETRY (EM);  Surgeon: Jerene Bears, MD;  Location: [facility];  Service: Gastroenterology;  Laterality: N/A;  . ESOPHAGOGASTRODUODENOSCOPY N/A 04/08/2015   Procedure: ESOPHAGOGASTRODUODENOSCOPY (EGD);  Surgeon: Milus Banister, MD;  Location: [facility];  Service: Endoscopy;  Laterality: N/A;  . ESOPHAGOGASTRODUODENOSCOPY (EGD) WITH PROPOFOL N/A 09/09/2015   Procedure: ESOPHAGOGASTRODUODENOSCOPY (EGD) WITH PROPOFOL;  Surgeon: Mauri Pole, MD;  Location: [facility];  Service: Endoscopy;  Laterality: N/A;  pt. is to have gastrografin xray post recovery-do not discharge until results are back  . FOOT FRACTURE SURGERY Right 1980's  . INSERT / REPLACE / REMOVE PACEMAKER    . SKIN CANCER EXCISION  "several"   "forearms, head"   Social History   Social History  . Marital status: Widowed    Spouse name: N/A  . Number of children: 2  . Years of education: GED   Occupational History  . 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 Smoking status: Former Smoker    Packs/day: 1.00    Years: 35.00    Types: Cigarettes    Quit date: 08/01/1992  . Smokeless tobacco: Never Used  . Alcohol use 0.0 oz/week     Comment: Approximately a beer every 2 years  . Drug use: No  . Sexual activity: Not Currently   Other Topics Concern  . Not on  file   Social History Narrative   Pt lives in [place] alone.  Widowed.   Right-handed.   Rare caffeine use - maybe one cup per month.   Current Outpatient Prescriptions on File Prior to Visit  Medication Sig Dispense Refill  . albuterol (PROVENTIL HFA;VENTOLIN HFA) 108 (90 Base) MCG/ACT inhaler Inhale 2 puffs into the lungs every 6 (six) hours as needed for wheezing or shortness of breath. 1 Inhaler 2  . aspirin 81 MG EC tablet Take 81 mg by mouth daily.      . Cholecalciferol (VITAMIN D PO) Take 2,000 Units by mouth daily.     Marland Kitchen donepezil (ARICEPT) 10 MG tablet Take 1 tablet (10 mg total) by mouth every morning. 90 tablet 3  . fenofibrate (TRICOR) 145 MG tablet TAKE 1 TABLET BY MOUTH EVERY DAY 90 tablet 3  . furosemide (LASIX) 20 MG tablet Take 2  tablets (40 mg total) by mouth daily. 90 tablet 3  . LANTUS SOLOSTAR 100 UNIT/ML Solostar Pen Inject 30 Units into the skin every morning.   11  . loratadine (CLARITIN) 10 MG tablet Take 1 tablet (10 mg total) by mouth daily. 30 tablet 1  . memantine (NAMENDA) 10 MG tablet Take 1 tablet (10 mg total) by mouth 2 (two) times daily. 180 tablet 3  . Multiple Vitamins-Minerals (PRESERVISION AREDS 2) CAPS Take 1 capsule by mouth 2 (two) times daily.    . nitroGLYCERIN (NITRODUR - DOSED IN MG/24 HR) 0.2 mg/hr patch Place 1 patch (0.2 mg total) onto the skin daily. 90 patch 3  . pantoprazole (PROTONIX) 20 MG tablet Take 1 tablet (20 mg total) by mouth daily. 30 tablet 11  . pravastatin (PRAVACHOL) 40 MG tablet TAKE 1 TABLET(40 MG) BY MOUTH EVERY EVENING 90 tablet 0  . SPIRIVA HANDIHALER 18 MCG inhalation capsule INHALE CONTENTS OF 1 CAPSULE VIA HANDIHALER ONCE DAILY 30 capsule 5  . SYMBICORT 160-4.5 MCG/ACT inhaler INHALE 2 PUFFS BY MOUTH TWICE DAILY 10.2 g 5   No current facility-administered medications on file prior to visit.    Allergies  Allergen Reactions  . Codeine Other (See Comments)    Gets very angry, disoriented  . Dilaudid  [Hydromorphone Hcl] Other (See Comments)    VERY AGITATED, HOSTILE  . Flomax [Tamsulosin Hcl] Shortness Of Breath  . Morphine And Related Other (See Comments)    VERY AGITATED, HOSTILE  . Sulfa Antibiotics Shortness Of Breath  . Beta Adrenergic Blockers Other (See Comments)    ? disorientation  . Carvedilol Other (See Comments)    DISORIENTATION   Family History  Problem Relation Age of Onset  . Stroke Father     Mother history unknown - never met her  . Colon cancer Neg Hx     PE: BP 102/66   Pulse 77   Ht 5' 8"  (1.727 m)   Wt 198 lb 12.8 oz (90.2 kg)   SpO2 95%   BMI 30.23 kg/m  Wt Readings from Last 3 Encounters:  07/19/16 198 lb 12.8 oz (90.2 kg)  06/21/16 200 lb 3.2 oz (90.8 kg)  05/06/16 196 lb (88.9 kg)   Constitutional: overweight, in NAD Eyes: PERRLA, EOMI, no exophthalmos ENT: moist mucous membranes, no thyromegaly, no cervical lymphadenopathy Cardiovascular: RRR, No MRG Respiratory: CTA B Gastrointestinal: abdomen soft, NT, ND, BS+ Musculoskeletal: no deformities, strength intact in all 4 Skin: moist, warm, no rashes Neurological: no tremor with outstretched hands, DTR normal in all 4  ASSESSMENT: 1. DM2, insulin-dependent, uncontrolled, with complications - PN - CKD stage 3-4 - CAD, CHF - ICD  PLAN:  1. Patient with long-standing, uncontrolled diabetes, on basal insulin regimen, which became insufficient after his prednisone courses for his COPD exacerbations. He is normally checking his blood sugars in the morning and these are quite variable, however, before Thanksgiving, he was checking 3 times a day. The sugars appear to increase as the day goes by. I discussed with patient and his son that we need to cover his meals, however, we do not have many options due to his kidney failure. We will need mealtime insulin. I suggested to use 7-10 units of NovoLog before every meal. We will continue Basaglar in a.m. I did advise them to increase the NovoLog if he  starts back on steroids. We will need to adjust the doses at next visit. - We also discussed briefly about diet - We also discussed  about the fact that his target HbA1c is 7.5% or lower. At today's visit, a repeat HbA1c returned 9.9%. - I suggested to:  Patient Instructions  Please continue Basaglar 30 units in am.  Add Novolog (10-15 min before a meal): - 7 units before a smaller meal - 10 units before a larger meal  If you need to start steroid again, you will need to increase Novolog by at least 50%.  Please return in 1.5 months with your sugar log.   - Strongly advised him to restart checking sugars at different times of the day - check 3 times a day, rotating checks - given sugar log and advised how to fill it and to bring it at next appt  - given foot care handout and explained the principles  - given instructions for hypoglycemia management "15-15 rule"  - advised for yearly eye exams >> he is up-to-date - Return to clinic in 1.5 mo with sugar log   Philemon Kingdom, MD PhD [facility] Endocrinology

## 2016-07-19 NOTE — Patient Instructions (Signed)
Please continue Basaglar 30 units in am.  Add Novolog (10-15 min before a meal): - 7 units before a smaller meal - 10 units before a larger meal  If you need to start steroid again, you will need to increase Novolog by at least 50%.  Please return in 1.5 months with your sugar log.   PATIENT INSTRUCTIONS FOR TYPE 2 DIABETES:  **Please join MyChart!** - see attached instructions about how to join if you have not done so already.  DIET AND EXERCISE Diet and exercise is an important part of diabetic treatment.  We recommended aerobic exercise in the form of brisk walking (working between 40-60% of maximal aerobic capacity, similar to brisk walking) for 150 minutes per week (such as 30 minutes five days per week) along with 3 times per week performing 'resistance' training (using various gauge rubber tubes with handles) 5-10 exercises involving the major muscle groups (upper body, lower body and core) performing 10-15 repetitions (or near fatigue) each exercise. Start at half the above goal but build slowly to reach the above goals. If limited by weight, joint pain, or disability, we recommend daily walking in a swimming pool with water up to waist to reduce pressure from joints while allow for adequate exercise.    BLOOD GLUCOSES Monitoring your blood glucoses is important for continued management of your diabetes. Please check your blood glucoses 2-4 times a day: fasting, before meals and at bedtime (you can rotate these measurements - e.g. one day check before the 3 meals, the next day check before 2 of the meals and before bedtime, etc.).   HYPOGLYCEMIA (low blood sugar) Hypoglycemia is usually a reaction to not eating, exercising, or taking too much insulin/ other diabetes drugs.  Symptoms include tremors, sweating, hunger, confusion, headache, etc. Treat IMMEDIATELY with 15 grams of Carbs: . 4 glucose tablets .  cup regular juice/soda . 2 tablespoons raisins . 4 teaspoons sugar . 1  tablespoon honey Recheck blood glucose in 15 mins and repeat above if still symptomatic/blood glucose <100.  RECOMMENDATIONS TO REDUCE YOUR RISK OF DIABETIC COMPLICATIONS: * Take your prescribed MEDICATION(S) * Follow a DIABETIC diet: Complex carbs, fiber rich foods, (monounsaturated and polyunsaturated) fats * AVOID saturated/trans fats, high fat foods, >2,300 mg salt per day. * EXERCISE at least 5 times a week for 30 minutes or preferably daily.  * DO NOT SMOKE OR DRINK more than 1 drink a day. * Check your FEET every day. Do not wear tightfitting shoes. Contact us if you develop an ulcer * See your EYE doctor once a year or more if needed * Get a FLU shot once a year * Get a PNEUMONIA vaccine once before and once after age 69 years  GOALS:  * Your Hemoglobin A1c of <7%  * fasting sugars need to be <130 * after meals sugars need to be <180 (2h after you start eating) * Your Systolic BP should be [id number] or lower  * Your Diastolic BP should be 80 or lower  * Your HDL (Good Cholesterol) should be 40 or higher  * Your LDL (Bad Cholesterol) should be 100 or lower. * Your Triglycerides should be 150 or lower  * Your Urine microalbumin (kidney function) should be <30 * Your Body Mass Index should be 25 or lower    Please consider the following ways to cut down carbs and fat and increase fiber and micronutrients in your diet: - substitute whole grain for white bread or pasta - substitute brown  rice for white rice - substitute 90-calorie flat bread pieces for slices of bread when possible - substitute sweet potatoes or yams for white potatoes - substitute humus for margarine - substitute tofu for cheese when possible - substitute almond or rice milk for regular milk (would not drink soy milk daily due to concern for soy estrogen influence on breast cancer risk) - substitute dark chocolate for other sweets when possible - substitute water - can add lemon or orange slices for taste - for diet  sodas (artificial sweeteners will trick your body that you can eat sweets without getting calories and will lead you to overeating and weight gain in the long run) - do not skip breakfast or other meals (this will slow down the metabolism and will result in more weight gain over time)  - can try smoothies made from fruit and almond/rice milk in am instead of regular breakfast - can also try old-fashioned (not instant) oatmeal made with almond/rice milk in am - order the dressing on the side when eating salad at a restaurant (pour less than half of the dressing on the salad) - eat as little meat as possible - can try juicing, but should not forget that juicing will get rid of the fiber, so would alternate with eating raw veg./fruits or drinking smoothies - use as little oil as possible, even when using olive oil - can dress a salad with a mix of balsamic vinegar and lemon juice, for e.g. - use agave nectar, stevia sugar, or regular sugar rather than artificial sweateners - steam or broil/roast veggies  - snack on veggies/fruit/nuts (unsalted, preferably) when possible, rather than processed foods - reduce or eliminate aspartame in diet (it is in diet sodas, chewing gum, etc) Read the labels!  Try to read Dr. Janene Harvey book: "Program for Reversing Diabetes" for other ideas for healthy eating.

## 2016-07-27 DIAGNOSIS — L84 Corns and callosities: Secondary | ICD-10-CM | POA: Diagnosis not present

## 2016-07-27 DIAGNOSIS — E1151 Type 2 diabetes mellitus with diabetic peripheral angiopathy without gangrene: Secondary | ICD-10-CM | POA: Diagnosis not present

## 2016-07-27 DIAGNOSIS — L603 Nail dystrophy: Secondary | ICD-10-CM | POA: Diagnosis not present

## 2016-07-27 DIAGNOSIS — I739 Peripheral vascular disease, unspecified: Secondary | ICD-10-CM | POA: Diagnosis not present

## 2016-08-08 ENCOUNTER — Encounter: Payer: Self-pay | Admitting: Internal Medicine

## 2016-08-08 ENCOUNTER — Telehealth: Payer: Self-pay | Admitting: Internal Medicine

## 2016-08-08 ENCOUNTER — Ambulatory Visit (INDEPENDENT_AMBULATORY_CARE_PROVIDER_SITE_OTHER): Payer: Medicare Other | Admitting: *Deleted

## 2016-08-08 ENCOUNTER — Telehealth: Payer: Self-pay

## 2016-08-08 DIAGNOSIS — I255 Ischemic cardiomyopathy: Secondary | ICD-10-CM | POA: Diagnosis not present

## 2016-08-08 MED ORDER — GLUCOSE BLOOD VI STRP: ORAL_STRIP | 5 refills | Status: DC

## 2016-08-08 NOTE — Telephone Encounter (Signed)
We can try to send a prescription with 7 times a day, although he was checking 1-3 times a day when I saw him last.

## 2016-08-08 NOTE — Telephone Encounter (Signed)
Per Dr.Gherghe okay to send in test strips checking 7 times daily.

## 2016-08-08 NOTE — Telephone Encounter (Signed)
SENT TO:  [facility], [place] [phone] (Phone) [phone] (Fax)   Patient needs Freestyle test strips sent to [facility]. He is checking 7 x a day. The patient is currently out of strips.

## 2016-08-08 NOTE — Progress Notes (Signed)
Remote ICD transmission.   

## 2016-08-10 ENCOUNTER — Encounter: Payer: Self-pay | Admitting: Cardiology

## 2016-08-12 ENCOUNTER — Other Ambulatory Visit: Payer: Self-pay

## 2016-08-17 ENCOUNTER — Ambulatory Visit: Payer: Medicare Other | Admitting: Pulmonary Disease

## 2016-08-22 ENCOUNTER — Other Ambulatory Visit: Payer: Self-pay | Admitting: Neurology

## 2016-08-23 LAB — CUP PACEART REMOTE DEVICE CHECK
Date Time Interrogation Session: 20180123164234
HighPow Impedance: 81 Ohm
Implantable Lead Implant Date: 20120829
Implantable Lead Location: 753859
Implantable Pulse Generator Implant Date: 20120829
Lead Channel Impedance Value: 430 Ohm
Lead Channel Impedance Value: 440 Ohm
Lead Channel Sensing Intrinsic Amplitude: 1.5 mV
Lead Channel Sensing Intrinsic Amplitude: 12 mV
MDC IDC LEAD IMPLANT DT: 20120829
MDC IDC LEAD LOCATION: 753860
MDC IDC PG SERIAL: 828604
MDC IDC STAT BRADY RA PERCENT PACED: 39 %
MDC IDC STAT BRADY RV PERCENT PACED: 1 % — AB

## 2016-09-05 ENCOUNTER — Other Ambulatory Visit: Payer: Self-pay | Admitting: Pulmonary Disease

## 2016-09-13 ENCOUNTER — Encounter: Payer: Self-pay | Admitting: Internal Medicine

## 2016-09-13 ENCOUNTER — Ambulatory Visit (INDEPENDENT_AMBULATORY_CARE_PROVIDER_SITE_OTHER): Payer: Medicare Other | Admitting: Internal Medicine

## 2016-09-13 VITALS — BP 124/72 | HR 76 | Ht 68.5 in | Wt 202.0 lb

## 2016-09-13 DIAGNOSIS — I255 Ischemic cardiomyopathy: Secondary | ICD-10-CM | POA: Diagnosis not present

## 2016-09-13 DIAGNOSIS — E1165 Type 2 diabetes mellitus with hyperglycemia: Secondary | ICD-10-CM | POA: Diagnosis not present

## 2016-09-13 DIAGNOSIS — E1159 Type 2 diabetes mellitus with other circulatory complications: Secondary | ICD-10-CM | POA: Diagnosis not present

## 2016-09-13 NOTE — Progress Notes (Signed)
Patient ID: Antonio Ray, male   DOB: 1934/05/16, 83 y.o.   MRN: [id number]   HPI: Antonio Ray is a 83 y.o.-year-old male, returning for f/u for DM2, dx in 2010, insulin-dependent since ~2012, uncontrolled, with complications (PN, CKD stage 3-4, CAD, CHF - ICD). He is here with his son who offers part of the history. Last visit 1.5 mo ago.  Last hemoglobin A1c was: Lab Results  Component Value Date   HGBA1C 9.9 07/19/2016   HGBA1C 10.2 (H) 04/21/2016   HGBA1C 8.5 (H) 03/22/2015   He had a lot of steroids for COPD. He had a severe PNA episode in 2016 2/2 aspiration 2/2 achalasia. He had several admissions >> on repeated Solumedrol doses. Since then >> occasional COPD exacerbations >> Solumedrol + prednisone tapers - not in last 3 months.  Pt was on a regimen of: - Basaglar 32 units in am  At last visit, we changed to: - Basaglar 30 units in am. - Novolog (10-15 min before a meal):    - 5 units before meals (he tried 7 units but felt he had lower BSs >> decreased the dose to 5)  Pt checks his sugars 7x (!) a day and they are better: - am: 118-189, 222 >> 120-151 - 2h after b'fast: n/c >> 151-246 - before lunch: 175-260 >> 100-188, 209 - 2h after lunch: n/c >> 93, 106-180, 233 - before dinner: 144, 159-287 >> 135-191, 188, 216 (granola bar, cookies) - 2h after dinner: n/c >> 107-176, 211, 240 - bedtime: n/c >> 114-187 - nighttime: n/c No lows. Lowest sugar was 99 >> 97 Highest sugar was HI >> 287 >> 240  Glucometer: Freestyle Lite  Pt's meals are: - Breakfast: bacon, eggs, hash browns - Lunch: sandwich or soup - Dinner: varies, meat + veggies - Snacks: 1-2  He walks daily.  - no CKD, last BUN/creatinine:  Lab Results  Component Value Date   BUN 47 (H) 04/22/2016   BUN 49 (H) 04/21/2016   CREATININE 1.74 (H) 04/22/2016   CREATININE 2.02 (H) 04/21/2016   - last set of lipids: Lab Results  Component Value Date   CHOL 136 06/20/2013   HDL 32.00 (L) 06/20/2013   LDLCALC 76 06/20/2013   TRIG 142.0 06/20/2013   CHOLHDL 4 06/20/2013   - last eye exam was in 03/17/2016. Dr. Marica Otter. ? DR. Had cataract Sx in 2016. + macular degeneration. Has a foot ulcer 11/2014 >> healed well (2016). He has DM shoes. R foot more swollen after his fx >> 20 years ago. He has a podiatrist. - no numbness and tingling in his feet.  ROS: Constitutional: no weight gain/loss, no fatigue, no subjective hyperthermia/hypothermia Eyes: no blurry vision, no xerophthalmia ENT: no sore throat, no nodules palpated in throat, no dysphagia/odynophagia, no hoarseness Cardiovascular: no CP/SOB/palpitations/ + R>L leg swelling Respiratory: no cough/SOB/wheezing Gastrointestinal: no N/V/D/C Musculoskeletal: no muscle/joint aches Skin: no rashes Neurological: no tremors/numbness/tingling/dizziness  I reviewed pt's medications, allergies, PMH, social hx, family hx, and changes were documented in the history of present illness. Otherwise, unchanged from my initial visit note.  Past Medical History:  Diagnosis Date  . Achalasia   . AICD (automatic cardioverter/defibrillator) present 03/30/2011   Analyze ST study patient  . Anginal pain ([place])   . BPH (benign prostatic hyperplasia)   . CHF (congestive heart failure) ([place])   . Chronic systolic dysfunction of left ventricle    EF 30-35%, CLASS II - III SYMPTOMS; intolerant to Coreg  .  CKD (chronic kidney disease) stage 3, GFR 30-59 ml/min 06/08/2012  . COPD (chronic obstructive pulmonary disease) ([place])   . Coronary artery disease    History of remote anterior MI with PCI to LAD in 2006; most recent cath 2007, no intervention required  . DOE (dyspnea on exertion)    with heavy exertion  . Dysrhythmia   . Esophageal dysmotility   . GERD (gastroesophageal reflux disease)   . Head injury, closed, with concussion 2000ish  . Heart murmur    hx  . HOH (hard of hearing)   . Hyperlipidemia   . Memory loss    improved  . Myocardial  infarction 1994   ANTERIOR  . Pneumonia "several times"   aspiration pna with at least 3 admits for this 2016.   Marland Kitchen PVC's (premature ventricular contractions)   . Renal failure   . Skin cancer "several"   "forearms; head"  . Type II diabetes mellitus ([place])    Past Surgical History:  Procedure Laterality Date  . BALLOON DILATION N/A 09/09/2015   Procedure: BALLOON DILATION;  Surgeon: Mauri Pole, MD;  Location: [facility];  Service: Endoscopy;  Laterality: N/A;  rigiflex achalasia balloon dilators  . BOTOX INJECTION N/A 04/08/2015   Procedure: BOTOX INJECTION;  Surgeon: Milus Banister, MD;  Location: [facility];  Service: Endoscopy;  Laterality: N/A;  . CARDIAC CATHETERIZATION  01/11/2006   DEMONSTRATES AKINESIA OF THE DISTAL ANTERIOR WALL, DISTAL INFERIOR WALL AND AKINESIA OF THE APEX. THE BASAL SEGMENTS CONTRACT WELL AND OVERALL EF 35%  . CATARACT EXTRACTION W/ INTRAOCULAR LENS  IMPLANT, BILATERAL  08/2014-09/2014  . CHOLECYSTECTOMY  2/11  . CORONARY ANGIOPLASTY  1994   TO THE LAD  . ESOPHAGEAL MANOMETRY N/A 03/16/2015   Procedure: ESOPHAGEAL MANOMETRY (EM);  Surgeon: Jerene Bears, MD;  Location: [facility];  Service: Gastroenterology;  Laterality: N/A;  . ESOPHAGOGASTRODUODENOSCOPY N/A 04/08/2015   Procedure: ESOPHAGOGASTRODUODENOSCOPY (EGD);  Surgeon: Milus Banister, MD;  Location: [facility];  Service: Endoscopy;  Laterality: N/A;  . ESOPHAGOGASTRODUODENOSCOPY (EGD) WITH PROPOFOL N/A 09/09/2015   Procedure: ESOPHAGOGASTRODUODENOSCOPY (EGD) WITH PROPOFOL;  Surgeon: Mauri Pole, MD;  Location: [facility];  Service: Endoscopy;  Laterality: N/A;  pt. is to have gastrografin xray post recovery-do not discharge until results are back  . FOOT FRACTURE SURGERY Right 1980's  . INSERT / REPLACE / REMOVE PACEMAKER    . SKIN CANCER EXCISION  "several"   "forearms, head"   Social History   Social History  . Marital status: Widowed    Spouse name: N/A  . Number of children: 2   . Years of education: GED   Occupational History  . 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 Smoking status: Former Smoker    Packs/day: 1.00    Years: 35.00    Types: Cigarettes    Quit date: 08/01/1992  . Smokeless tobacco: Never Used  . Alcohol use 0.0 oz/week     Comment: Approximately a beer every 2 years  . Drug use: No  . Sexual activity: Not Currently   Other Topics Concern  . Not on file   Social History Narrative   Pt lives in [place] alone.  Widowed.   Right-handed.   Rare caffeine use - maybe one cup per month.   Current Outpatient Prescriptions on File Prior to Visit  Medication Sig Dispense Refill  . albuterol (  PROVENTIL HFA;VENTOLIN HFA) 108 (90 Base) MCG/ACT inhaler Inhale 2 puffs into the lungs every 6 (six) hours as needed for wheezing or shortness of breath. 1 Inhaler 2  . aspirin 81 MG EC tablet Take 81 mg by mouth daily.      . Cholecalciferol (VITAMIN D PO) Take 2,000 Units by mouth daily.     Marland Kitchen donepezil (ARICEPT) 10 MG tablet Take 1 tablet (10 mg total) by mouth every morning. 90 tablet 3  . fenofibrate (TRICOR) 145 MG tablet TAKE 1 TABLET BY MOUTH EVERY DAY 90 tablet 3  . furosemide (LASIX) 20 MG tablet Take 2 tablets (40 mg total) by mouth daily. 90 tablet 3  . glucose blood (FREESTYLE TEST STRIPS) test strip Use as instructed to check sugar 7 times daily 500 each 5  . insulin aspart (NOVOLOG FLEXPEN) 100 UNIT/ML FlexPen Inject 7-10 Units into the skin 3 (three) times daily before meals. 15 mL 5  . Insulin Glargine (BASAGLAR KWIKPEN) 100 UNIT/ML SOPN Inject 0.3 mLs (30 Units total) into the skin at bedtime. 5 pen 5  . Insulin Pen Needle (CAREFINE PEN NEEDLES) 32G X 5 MM MISC Use 4x a day 200 each 5  . loratadine (CLARITIN) 10 MG tablet Take 1 tablet (10 mg total) by mouth daily. 30 tablet 1  . memantine (NAMENDA) 10 MG tablet Take one tablet twice daily. Please call [phone]  for an appointment. 180 tablet 0  . Multiple Vitamins-Minerals (PRESERVISION AREDS 2) CAPS Take 1 capsule by mouth 2 (two) times daily.    . nitroGLYCERIN (NITRODUR - DOSED IN MG/24 HR) 0.2 mg/hr patch Place 1 patch (0.2 mg total) onto the skin daily. 90 patch 3  . pantoprazole (PROTONIX) 20 MG tablet Take 1 tablet (20 mg total) by mouth daily. 30 tablet 11  . pravastatin (PRAVACHOL) 40 MG tablet TAKE 1 TABLET(40 MG) BY MOUTH EVERY EVENING 90 tablet 0  . SPIRIVA HANDIHALER 18 MCG inhalation capsule INHALE CONTENTS OF 1 CAPSULE VIA HANDIHALER ONCE DAILY 30 capsule 4  . SYMBICORT 160-4.5 MCG/ACT inhaler INHALE 2 PUFFS BY MOUTH TWICE DAILY 10.2 g 5   No current facility-administered medications on file prior to visit.    Allergies  Allergen Reactions  . Codeine Other (See Comments)    Gets very angry, disoriented  . Dilaudid [Hydromorphone Hcl] Other (See Comments)    VERY AGITATED, HOSTILE  . Flomax [Tamsulosin Hcl] Shortness Of Breath  . Morphine And Related Other (See Comments)    VERY AGITATED, HOSTILE  . Sulfa Antibiotics Shortness Of Breath  . Beta Adrenergic Blockers Other (See Comments)    ? disorientation  . Carvedilol Other (See Comments)    DISORIENTATION   Family History  Problem Relation Age of Onset  . Stroke Father     Mother history unknown - never met her  . Colon cancer Neg Hx     PE: BP 124/72 (BP Location: Left Arm, Patient Position: Sitting)   Pulse 76   Ht 5' 8.5" (1.74 m)   Wt 202 lb (91.6 kg)   SpO2 94%   BMI 30.27 kg/m  Wt Readings from Last 3 Encounters:  09/13/16 202 lb (91.6 kg)  07/19/16 198 lb 12.8 oz (90.2 kg)  06/21/16 200 lb 3.2 oz (90.8 kg)   Constitutional: overweight, in NAD Eyes: PERRLA, EOMI, no exophthalmos ENT: moist mucous membranes, no thyromegaly, no cervical lymphadenopathy Cardiovascular: RRR, No MRG, R ankle swelling (chronic) Respiratory: CTA B Gastrointestinal: abdomen soft, NT, ND, BS+ Musculoskeletal: no  deformities,  strength intact in all 4 Skin: moist, warm, no rashes Neurological: no tremor with outstretched hands, DTR normal in all 4  ASSESSMENT: 1. DM2, insulin-dependent, uncontrolled, with complications - PN - CKD stage 3-4 - CAD, CHF - ICD  PLAN:  1. Patient with long-standing, uncontrolled diabetes, worse after prednisone courses for his COPD exacerbations. He was on basal insulin regimen, to which we added rapid-acting insulin at last visit.   - he did a great job checking sugars 7x a day >> advised him to check less often. Sugars are better, despite using a lower Novolog dose. His sugars are higher after b'fast >> will increase the dose with this meal to 6, then possibly 7 units. Also use 7 units if eating a larger meal or dessert. Advised him to try to eat sweets at the end of the meal rather than halfway between meals but he tells me that he doesn't like to do that. - I again advise them to increase the NovoLog if he starts back on steroids.  - last HbA1c was 9.9% at last visit  - I suggested to:  Patient Instructions  Please continue: - Basaglar 30 units in am.  Please increase: - Novolog (10-15 min before a meal): - 5 units before a smaller meal - 7 units before a larger meal  If you need to start steroid again, you will need to increase Novolog by at least 50%.  Please return in 2 months with your sugar log.   - check sugars at different times of the day - check 3  times a day, rotating checks - advised for yearly eye exams >> he is up-to-date - Return to clinic in 2 mo with sugar log   Philemon Kingdom, MD PhD [facility] Endocrinology

## 2016-09-13 NOTE — Patient Instructions (Addendum)
Please continue: - Basaglar 30 units in am.  Please increase: - Novolog (10-15 min before a meal): - 5 units before a smaller meal - 7 units before a larger meal  If you need to start steroid again, you will need to increase Novolog by at least 50%.  Please return in 2 months with your sugar log.

## 2016-09-21 ENCOUNTER — Other Ambulatory Visit (HOSPITAL_BASED_OUTPATIENT_CLINIC_OR_DEPARTMENT_OTHER): Payer: Self-pay

## 2016-09-21 ENCOUNTER — Observation Stay (HOSPITAL_COMMUNITY)
Admission: EM | Admit: 2016-09-21 | Discharge: 2016-09-22 | Disposition: A | Discharge status: Home / Self Care | Payer: Medicare Other | Attending: Family Medicine | Admitting: Family Medicine

## 2016-09-21 ENCOUNTER — Encounter (HOSPITAL_COMMUNITY): Payer: Self-pay | Admitting: *Deleted

## 2016-09-21 ENCOUNTER — Other Ambulatory Visit: Payer: Self-pay

## 2016-09-21 ENCOUNTER — Emergency Department (HOSPITAL_COMMUNITY): Payer: Medicare Other

## 2016-09-21 DIAGNOSIS — Z950 Presence of cardiac pacemaker: Secondary | ICD-10-CM | POA: Insufficient documentation

## 2016-09-21 DIAGNOSIS — N183 Chronic kidney disease, stage 3 unspecified: Secondary | ICD-10-CM | POA: Diagnosis present

## 2016-09-21 DIAGNOSIS — Z7982 Long term (current) use of aspirin: Secondary | ICD-10-CM | POA: Insufficient documentation

## 2016-09-21 DIAGNOSIS — I252 Old myocardial infarction: Secondary | ICD-10-CM | POA: Insufficient documentation

## 2016-09-21 DIAGNOSIS — I2510 Atherosclerotic heart disease of native coronary artery without angina pectoris: Secondary | ICD-10-CM | POA: Diagnosis not present

## 2016-09-21 DIAGNOSIS — J189 Pneumonia, unspecified organism: Secondary | ICD-10-CM | POA: Diagnosis not present

## 2016-09-21 DIAGNOSIS — J449 Chronic obstructive pulmonary disease, unspecified: Secondary | ICD-10-CM | POA: Diagnosis present

## 2016-09-21 DIAGNOSIS — R918 Other nonspecific abnormal finding of lung field: Secondary | ICD-10-CM | POA: Diagnosis not present

## 2016-09-21 DIAGNOSIS — E1122 Type 2 diabetes mellitus with diabetic chronic kidney disease: Secondary | ICD-10-CM | POA: Insufficient documentation

## 2016-09-21 DIAGNOSIS — Z85828 Personal history of other malignant neoplasm of skin: Secondary | ICD-10-CM | POA: Insufficient documentation

## 2016-09-21 DIAGNOSIS — R509 Fever, unspecified: Secondary | ICD-10-CM | POA: Diagnosis not present

## 2016-09-21 DIAGNOSIS — R0602 Shortness of breath: Secondary | ICD-10-CM | POA: Diagnosis not present

## 2016-09-21 DIAGNOSIS — Z87891 Personal history of nicotine dependence: Secondary | ICD-10-CM | POA: Insufficient documentation

## 2016-09-21 DIAGNOSIS — I5022 Chronic systolic (congestive) heart failure: Secondary | ICD-10-CM | POA: Diagnosis present

## 2016-09-21 DIAGNOSIS — Z794 Long term (current) use of insulin: Secondary | ICD-10-CM | POA: Insufficient documentation

## 2016-09-21 DIAGNOSIS — I5043 Acute on chronic combined systolic (congestive) and diastolic (congestive) heart failure: Secondary | ICD-10-CM | POA: Diagnosis not present

## 2016-09-21 DIAGNOSIS — Z79899 Other long term (current) drug therapy: Secondary | ICD-10-CM | POA: Insufficient documentation

## 2016-09-21 DIAGNOSIS — I130 Hypertensive heart and chronic kidney disease with heart failure and stage 1 through stage 4 chronic kidney disease, or unspecified chronic kidney disease: Secondary | ICD-10-CM | POA: Diagnosis not present

## 2016-09-21 LAB — COMPREHENSIVE METABOLIC PANEL
ALBUMIN: 3.3 g/dL — AB (ref 3.5–5.0)
ALK PHOS: 33 U/L — AB (ref 38–126)
ALT: 25 U/L (ref 17–63)
ANION GAP: 10 (ref 5–15)
AST: 31 U/L (ref 15–41)
BUN: 34 mg/dL — ABNORMAL HIGH (ref 6–20)
CALCIUM: 9.3 mg/dL (ref 8.9–10.3)
CO2: 26 mmol/L (ref 22–32)
Chloride: 104 mmol/L (ref 101–111)
Creatinine, Ser: 2.1 mg/dL — ABNORMAL HIGH (ref 0.61–1.24)
GFR, EST AFRICAN AMERICAN: 32 mL/min — AB (ref >60)
GFR, EST NON AFRICAN AMERICAN: 28 mL/min — AB (ref >60)
GLUCOSE: 212 mg/dL — AB (ref 65–99)
POTASSIUM: 4 mmol/L (ref 3.5–5.1)
Sodium: 140 mmol/L (ref 135–145)
TOTAL PROTEIN: 6.2 g/dL — AB (ref 6.5–8.1)
Total Bilirubin: 0.5 mg/dL (ref 0.3–1.2)

## 2016-09-21 LAB — URINALYSIS, ROUTINE W REFLEX MICROSCOPIC
BILIRUBIN URINE: NEGATIVE
GLUCOSE, UA: 150 mg/dL — AB
Hgb urine dipstick: NEGATIVE
KETONES UR: NEGATIVE mg/dL
Leukocytes, UA: NEGATIVE
NITRITE: NEGATIVE
PH: 5 (ref 5.0–8.0)
PROTEIN: NEGATIVE mg/dL
Specific Gravity, Urine: 1.02 (ref 1.005–1.030)

## 2016-09-21 LAB — CBC WITH DIFFERENTIAL/PLATELET
BASOS ABS: 0 10*3/uL (ref 0.0–0.1)
BASOS PCT: 0 %
Eosinophils Absolute: 0.1 10*3/uL (ref 0.0–0.7)
Eosinophils Relative: 1 %
HEMATOCRIT: 41.6 % (ref 39.0–52.0)
Hemoglobin: 13.3 g/dL (ref 13.0–17.0)
LYMPHS PCT: 12 %
Lymphs Abs: 2 10*3/uL (ref 0.7–4.0)
MCH: 28.9 pg (ref 26.0–34.0)
MCHC: 32 g/dL (ref 30.0–36.0)
MCV: 90.4 fL (ref 78.0–100.0)
Monocytes Absolute: 1.2 10*3/uL — ABNORMAL HIGH (ref 0.1–1.0)
Monocytes Relative: 7 %
NEUTROS ABS: 13.6 10*3/uL — AB (ref 1.7–7.7)
Neutrophils Relative %: 80 %
PLATELETS: 212 10*3/uL (ref 150–400)
RBC: 4.6 MIL/uL (ref 4.22–5.81)
RDW: 14.6 % (ref 11.5–15.5)
WBC: 16.9 10*3/uL — AB (ref 4.0–10.5)

## 2016-09-21 LAB — I-STAT CG4 LACTIC ACID, ED
Lactic Acid, Venous: 1.88 mmol/L (ref 0.5–1.9)
Lactic Acid, Venous: 1.18 mmol/L (ref 0.5–1.9)

## 2016-09-21 LAB — INFLUENZA PANEL BY PCR (TYPE A & B)
Influenza A By PCR: NEGATIVE
Influenza B By PCR: NEGATIVE

## 2016-09-21 MED ORDER — PIPERACILLIN-TAZOBACTAM 3.375 G IVPB
3.3750 g | Freq: Once | INTRAVENOUS | Status: AC
  Administered 2016-09-21: 3.375 g via INTRAVENOUS
  Filled 2016-09-21: qty 50

## 2016-09-21 MED ORDER — SODIUM CHLORIDE 0.9 % IV BOLUS (SEPSIS)
2500.0000 mL | Freq: Once | INTRAVENOUS | Status: AC
  Administered 2016-09-21: 1000 mL via INTRAVENOUS

## 2016-09-21 NOTE — ED Provider Notes (Signed)
[facility] DEPT Provider Note   CSN: [id number] Arrival date & time: 09/21/16  1824     History   Chief Complaint Chief Complaint  Patient presents with  . Fever    HPI Antonio Ray is a 83 y.o. male.  Patient started with a fever today of weakness. Patient has a history of multiple admissions for aspiration pneumonia.   The history is provided by the patient. No language interpreter was used.  Fever   This is a new problem. The current episode started 12 to 24 hours ago. The problem occurs constantly. The problem has not changed since onset.The maximum temperature noted was 100 to 100.9 F. Pertinent negatives include no chest pain, no diarrhea, no congestion, no headaches and no cough. He has tried nothing for the symptoms.    Past Medical History:  Diagnosis Date  . Achalasia   . AICD (automatic cardioverter/defibrillator) present 03/30/2011   Analyze ST study patient  . Anginal pain ([place])   . BPH (benign prostatic hyperplasia)   . CHF (congestive heart failure) ([place])   . Chronic systolic dysfunction of left ventricle    EF 30-35%, CLASS II - III SYMPTOMS; intolerant to Coreg  . CKD (chronic kidney disease) stage 3, GFR 30-59 ml/min 06/08/2012  . COPD (chronic obstructive pulmonary disease) ([place])   . Coronary artery disease    History of remote anterior MI with PCI to LAD in 2006; most recent cath 2007, no intervention required  . DOE (dyspnea on exertion)    with heavy exertion  . Dysrhythmia   . Esophageal dysmotility   . GERD (gastroesophageal reflux disease)   . Head injury, closed, with concussion 2000ish  . Heart murmur    hx  . HOH (hard of hearing)   . Hyperlipidemia   . Memory loss    improved  . Myocardial infarction 1994   ANTERIOR  . Pneumonia "several times"   aspiration pna with at least 3 admits for this 2016.   Marland Kitchen PVC's (premature ventricular contractions)   . Renal failure   . Skin cancer "several"   "forearms; head"  . Type II  diabetes mellitus [facility])     Patient Active Problem List   Diagnosis Date Noted  . Acute respiratory failure with hypoxemia ([place])   . Acute respiratory failure ([place]) 04/21/2016  . Chronic combined systolic and diastolic congestive heart failure ([place]) 04/21/2016  . Memory loss 04/21/2016  . Allergic rhinitis 04/11/2016  . Chronic systolic CHF (congestive heart failure) ([place]) 10/30/2015  . Mild cognitive impairment 08/18/2015  . Difficulty in swallowing   . Pneumonia 05/12/2015  . AKI (acute kidney injury) ([place])   . Poorly controlled type 2 diabetes mellitus with circulatory disorder ([place])   . Chronic systolic (congestive) heart failure   . Chronic kidney disease, stage III (moderate)   . Other specified hypotension   . CKD (chronic kidney disease), stage III   . Sepsis due to pneumonia ([place])   . Aspiration pneumonia due to food (regurgitated) ([place])   . Pneumonitis   . Other emphysema ([place])   . Systolic CHF, acute on chronic ([name])   . AICD (automatic cardioverter/defibrillator) present   . CAD S/P percutaneous coronary angioplasty   . Acute respiratory failure with hypoxia ([place]) 04/03/2015  . Cardiomyopathy, ischemic 04/03/2015  . Elevated troponin 04/03/2015  . Sepsis ([place]) 03/31/2015  . Blood poisoning ([place])   . COPD exacerbation ([place])   . HCAP (healthcare-associated pneumonia) and recurrent Aspiration Pneumonia 03/21/2015  .  Aspiration into airway 03/21/2015  . Achalasia 03/21/2015  . Dysphagia   . Aspiration pneumonia ([place])   . Esophageal dysphagia   . Esophageal dysmotilities   . PNA (pneumonia) 02/07/2015  . COPD with acute exacerbation ([place]) 01/14/2015  . Left ventricular apical thrombus 01/14/2015  . Acute on chronic systolic CHF (congestive heart failure) ([place]) 01/14/2015  . ARF (acute renal failure) ([place]) 01/14/2015  . Dysphagia, unspecified(787.20) 10/30/2013  . COPD GOLD IV 10/07/2013  . Influenza with pneumonia 09/11/2013  . Leukocytosis 09/11/2013  .  Pneumonia, community acquired 09/10/2013  . Acute encephalopathy 09/10/2013  . Renal failure (ARF), acute on chronic ([name]) 09/10/2013  . Automatic implantable cardioverter-defibrillator in situ 07/06/2012  . CKD (chronic kidney disease) stage 3, GFR 30-59 ml/min 06/08/2012  . GERD (gastroesophageal reflux disease) 09/26/2011  . BPH (benign prostatic hyperplasia) 08/22/2011  . COPD (chronic obstructive pulmonary disease) ([place]) 07/17/2011  . S/P ICD (internal cardiac defibrillator) procedure 04/20/2011  . Coronary artery disease   . Sick sinus syndrome ([place])   . Acute on chronic combined systolic and diastolic congestive heart failure, NYHA class 3 ([place])   . Hyperlipidemia   . PVC's (premature ventricular contractions)     Past Surgical History:  Procedure Laterality Date  . BALLOON DILATION N/A 09/09/2015   Procedure: BALLOON DILATION;  Surgeon: Mauri Pole, MD;  Location: [facility] ENDOSCOPY;  Service: Endoscopy;  Laterality: N/A;  rigiflex achalasia balloon dilators  . BOTOX INJECTION N/A 04/08/2015   Procedure: BOTOX INJECTION;  Surgeon: Milus Banister, MD;  Location: [facility];  Service: Endoscopy;  Laterality: N/A;  . CARDIAC CATHETERIZATION  01/11/2006   DEMONSTRATES AKINESIA OF THE DISTAL ANTERIOR WALL, DISTAL INFERIOR WALL AND AKINESIA OF THE APEX. THE BASAL SEGMENTS CONTRACT WELL AND OVERALL EF 35%  . CATARACT EXTRACTION W/ INTRAOCULAR LENS  IMPLANT, BILATERAL  08/2014-09/2014  . CHOLECYSTECTOMY  2/11  . CORONARY ANGIOPLASTY  1994   TO THE LAD  . ESOPHAGEAL MANOMETRY N/A 03/16/2015   Procedure: ESOPHAGEAL MANOMETRY (EM);  Surgeon: Jerene Bears, MD;  Location: [facility] END[facility];  Service: Gastroenterology;  Laterality: N/A;  . ESOPHAGOGASTRODUODENOSCOPY N/A 04/08/2015   Procedure: ESOPHAGOGASTRODUODENOSCOPY (EGD);  Surgeon: Milus Banister, MD;  Location: [facility];  Service: Endoscopy;  Laterality: N/A;  . ESOPHAGOGASTRODUODENOSCOPY (EGD) WITH PROPOFOL N/A 09/09/2015   Procedure:  ESOPHAGOGASTRODUODENOSCOPY (EGD) WITH PROPOFOL;  Surgeon: Mauri Pole, MD;  Location: [facility];  Service: Endoscopy;  Laterality: N/A;  pt. is to have gastrografin xray post recovery-do not discharge until results are back  . FOOT FRACTURE SURGERY Right 1980's  . INSERT / REPLACE / REMOVE PACEMAKER    . SKIN CANCER EXCISION  "several"   "forearms, head"       Home Medications    Prior to Admission medications   Medication Sig Start Date End Date Taking? Authorizing Provider  albuterol (PROVENTIL HFA;VENTOLIN HFA) 108 (90 Base) MCG/ACT inhaler Inhale 2 puffs into the lungs every 6 (six) hours as needed for wheezing or shortness of breath. 04/22/16   Eugenie Filler, MD  aspirin 81 MG EC tablet Take 81 mg by mouth daily.      Historical Provider, MD  Cholecalciferol (VITAMIN D PO) Take 2,000 Units by mouth daily.     Historical Provider, MD  donepezil (ARICEPT) 10 MG tablet Take 1 tablet (10 mg total) by mouth every morning. 08/18/15   Marcial Pacas, MD  fenofibrate (TRICOR) 145 MG tablet TAKE 1 TABLET BY MOUTH EVERY DAY 11/24/15   Collier Salina  M Martinique, MD  furosemide (LASIX) 20 MG tablet Take 2 tablets (40 mg total) by mouth daily. 10/30/15   Peter M Martinique, MD  glucose blood (FREESTYLE TEST STRIPS) test strip Use as instructed to check sugar 7 times daily 08/08/16   Philemon Kingdom, MD  insulin aspart (NOVOLOG FLEXPEN) 100 UNIT/ML FlexPen Inject 7-10 Units into the skin 3 (three) times daily before meals. 07/19/16   Philemon Kingdom, MD  Insulin Glargine (BASAGLAR KWIKPEN) 100 UNIT/ML SOPN Inject 0.3 mLs (30 Units total) into the skin at bedtime. 07/19/16   Philemon Kingdom, MD  Insulin Pen Needle (CAREFINE PEN NEEDLES) 32G X 5 MM MISC Use 4x a day 07/19/16   Philemon Kingdom, MD  loratadine (CLARITIN) 10 MG tablet Take 1 tablet (10 mg total) by mouth daily. 07/02/15   Barton Dubois, MD  memantine (NAMENDA) 10 MG tablet Take one tablet twice daily. Please call [phone] for an appointment.  08/22/16   Marcial Pacas, MD  Multiple Vitamins-Minerals (PRESERVISION AREDS 2) CAPS Take 1 capsule by mouth 2 (two) times daily.    Historical Provider, MD  nitroGLYCERIN (NITRODUR - DOSED IN MG/24 HR) 0.2 mg/hr patch Place 1 patch (0.2 mg total) onto the skin daily. 09/15/15   Peter M Martinique, MD  pantoprazole (PROTONIX) 20 MG tablet Take 1 tablet (20 mg total) by mouth daily. 08/26/15   Mauri Pole, MD  pravastatin (PRAVACHOL) 40 MG tablet TAKE 1 TABLET(40 MG) BY MOUTH EVERY EVENING 06/27/16   Peter M Martinique, MD  SPIRIVA HANDIHALER 18 MCG inhalation capsule INHALE CONTENTS OF 1 CAPSULE VIA HANDIHALER ONCE DAILY 09/05/16   Juanito Doom, MD  SYMBICORT 160-4.5 MCG/ACT inhaler INHALE 2 PUFFS BY MOUTH TWICE DAILY 05/30/16   Juanito Doom, MD    Family History Family History  Problem Relation Age of Onset  . Stroke Father     Mother history unknown - never met her  . Colon cancer Neg Hx     Social History Social History  Substance Use Topics  . Smoking status: Former Smoker    Packs/day: 1.00    Years: 35.00    Types: Cigarettes    Quit date: 08/01/1992  . Smokeless tobacco: Never Used  . Alcohol use No     Allergies   Codeine; Dilaudid [hydromorphone hcl]; Flomax [tamsulosin hcl]; Morphine and related; Sulfa antibiotics; Beta adrenergic blockers; and Carvedilol   Review of Systems Review of Systems  Constitutional: Positive for fever. Negative for appetite change and fatigue.  HENT: Negative for congestion, ear discharge and sinus pressure.   Eyes: Negative for discharge.  Respiratory: Negative for cough.   Cardiovascular: Negative for chest pain.  Gastrointestinal: Negative for abdominal pain and diarrhea.  Genitourinary: Negative for frequency and hematuria.  Musculoskeletal: Negative for back pain.  Skin: Negative for rash.  Neurological: Negative for seizures and headaches.  Psychiatric/Behavioral: Negative for hallucinations.     Physical Exam Updated Vital  Signs BP 125/59   Pulse 60   Temp 100.3 F (37.9 C) (Rectal)   Resp 22   Ht _0  (1.753 m)   Wt 194 lb (88 kg)   SpO2 97%   BMI 28.65 kg/m   Physical Exam  Constitutional: He is oriented to person, place, and time. He appears well-developed.  HENT:  Head: Normocephalic.  Eyes: Conjunctivae and EOM are normal. No scleral icterus.  Neck: Neck supple. No thyromegaly present.  Cardiovascular: Normal rate and regular rhythm.  Exam reveals no gallop and no friction rub.  No murmur heard. Pulmonary/Chest: No stridor. He has no wheezes. He has no rales. He exhibits no tenderness.  Abdominal: He exhibits no distension. There is no tenderness. There is no rebound.  Musculoskeletal: Normal range of motion. He exhibits no edema.  Lymphadenopathy:    He has no cervical adenopathy.  Neurological: He is oriented to person, place, and time. He exhibits normal muscle tone. Coordination normal.  Skin: No rash noted. No erythema.  Psychiatric: He has a normal mood and affect. His behavior is normal.     ED Treatments / Results  Labs (all labs ordered are listed, but only abnormal results are displayed) Labs Reviewed  COMPREHENSIVE METABOLIC PANEL - Abnormal; Notable for the following:       Result Value   Glucose, Bld 212 (*)    BUN 34 (*)    Creatinine, Ser 2.10 (*)    Total Protein 6.2 (*)    Albumin 3.3 (*)    Alkaline Phosphatase 33 (*)    GFR calc non Af Amer 28 (*)    GFR calc Af Amer 32 (*)    All other components within normal limits  CBC WITH DIFFERENTIAL/PLATELET - Abnormal; Notable for the following:    WBC 16.9 (*)    Neutro Abs 13.6 (*)    Monocytes Absolute 1.2 (*)    All other components within normal limits  URINALYSIS, ROUTINE W REFLEX MICROSCOPIC - Abnormal; Notable for the following:    Glucose, UA 150 (*)    All other components within normal limits  CULTURE, BLOOD (ROUTINE X 2)  CULTURE, BLOOD (ROUTINE X 2)  INFLUENZA PANEL BY PCR (TYPE A & B)  I-STAT CG4  LACTIC ACID, ED  I-STAT CG4 LACTIC ACID, ED    EKG  EKG Interpretation None       Radiology Dg Chest 2 View  Result Date: 09/21/2016 CLINICAL DATA:  Fever with weakness EXAM: CHEST  2 VIEW COMPARISON:  04/22/2016 FINDINGS: Left-sided pacing device with leads over the right atrium and right ventricle. Hyperinflation is present. No focal consolidation or pleural effusion. Biapical scarring. Borderline heart size with atherosclerosis. No pneumothorax. IMPRESSION: Hyperinflation without acute pulmonary infiltrate Electronically Signed   By: Donavan Foil M.D.   On: 09/21/2016 20:19    Procedures Procedures (including critical care time)  Medications Ordered in ED Medications  sodium chloride 0.9 % bolus 2,500 mL (0 mLs Intravenous Stopped 09/21/16 2258)  piperacillin-tazobactam (ZOSYN) IVPB 3.375 g (0 g Intravenous Stopped 09/21/16 2122)     Initial Impression / Assessment and Plan / ED Course  I have reviewed the triage vital signs and the nursing notes.  Pertinent labs & imaging results that were available during my care of the patient were reviewed by me and considered in my medical decision making (see chart for details).     Patient with a febrile illness and leukocytosis. Patient will be seen by [facility] hospitalist for possible admission for observation  Final Clinical Impressions(s) / ED Diagnoses   Final diagnoses:  Fever chills    New Prescriptions New Prescriptions   No medications on file     Milton Ferguson, MD 09/21/16 2358

## 2016-09-21 NOTE — ED Triage Notes (Signed)
Because PT has CHF IV bolus decreased to 1500

## 2016-09-21 NOTE — ED Triage Notes (Signed)
Unable to hang Anti-bx because PT was in X-ray dept. Pharmacy informed.

## 2016-09-21 NOTE — ED Triage Notes (Signed)
Information provided by EMS. Family reports Pt was not acting like himself to day. EMS reported a fever of 101 oral. Pt was given 1000mg  tylenol by EMS. Pt denies any cold Sx's.

## 2016-09-22 ENCOUNTER — Observation Stay (HOSPITAL_COMMUNITY): Payer: Medicare Other

## 2016-09-22 DIAGNOSIS — I5022 Chronic systolic (congestive) heart failure: Secondary | ICD-10-CM

## 2016-09-22 DIAGNOSIS — R0602 Shortness of breath: Secondary | ICD-10-CM | POA: Diagnosis not present

## 2016-09-22 DIAGNOSIS — J181 Lobar pneumonia, unspecified organism: Secondary | ICD-10-CM

## 2016-09-22 DIAGNOSIS — J449 Chronic obstructive pulmonary disease, unspecified: Secondary | ICD-10-CM | POA: Diagnosis not present

## 2016-09-22 DIAGNOSIS — E1165 Type 2 diabetes mellitus with hyperglycemia: Secondary | ICD-10-CM | POA: Diagnosis not present

## 2016-09-22 DIAGNOSIS — R509 Fever, unspecified: Secondary | ICD-10-CM | POA: Diagnosis not present

## 2016-09-22 DIAGNOSIS — J189 Pneumonia, unspecified organism: Secondary | ICD-10-CM

## 2016-09-22 DIAGNOSIS — N183 Chronic kidney disease, stage 3 (moderate): Secondary | ICD-10-CM

## 2016-09-22 DIAGNOSIS — E1159 Type 2 diabetes mellitus with other circulatory complications: Secondary | ICD-10-CM

## 2016-09-22 LAB — CBC WITH DIFFERENTIAL/PLATELET
Basophils Absolute: 0 10*3/uL (ref 0.0–0.1)
Basophils Relative: 0 %
EOS ABS: 0.2 10*3/uL (ref 0.0–0.7)
EOS PCT: 2 %
HCT: 40.2 % (ref 39.0–52.0)
Hemoglobin: 12.7 g/dL — ABNORMAL LOW (ref 13.0–17.0)
LYMPHS ABS: 2.2 10*3/uL (ref 0.7–4.0)
Lymphocytes Relative: 18 %
MCH: 28.9 pg (ref 26.0–34.0)
MCHC: 31.6 g/dL (ref 30.0–36.0)
MCV: 91.4 fL (ref 78.0–100.0)
MONO ABS: 1 10*3/uL (ref 0.1–1.0)
MONOS PCT: 8 %
Neutro Abs: 8.8 10*3/uL — ABNORMAL HIGH (ref 1.7–7.7)
Neutrophils Relative %: 72 %
PLATELETS: 202 10*3/uL (ref 150–400)
RBC: 4.4 MIL/uL (ref 4.22–5.81)
RDW: 14.7 % (ref 11.5–15.5)
WBC: 12.3 10*3/uL — ABNORMAL HIGH (ref 4.0–10.5)

## 2016-09-22 LAB — GLUCOSE, CAPILLARY
GLUCOSE-CAPILLARY: 221 mg/dL — AB (ref 65–99)
GLUCOSE-CAPILLARY: 131 mg/dL — AB (ref 65–99)
GLUCOSE-CAPILLARY: 126 mg/dL — AB (ref 65–99)
Glucose-Capillary: 114 mg/dL — ABNORMAL HIGH (ref 65–99)

## 2016-09-22 LAB — STREP PNEUMONIAE URINARY ANTIGEN: Strep Pneumo Urinary Antigen: NEGATIVE

## 2016-09-22 LAB — HIV ANTIBODY (ROUTINE TESTING W REFLEX): HIV SCREEN 4TH GENERATION: NONREACTIVE

## 2016-09-22 MED ORDER — AZITHROMYCIN 500 MG PO TABS
500.0000 mg | ORAL_TABLET | Freq: Every day | ORAL | Status: DC
  Administered 2016-09-22: 500 mg via ORAL
  Filled 2016-09-22: qty 2

## 2016-09-22 MED ORDER — MEMANTINE HCL 10 MG PO TABS
10.0000 mg | ORAL_TABLET | Freq: Two times a day (BID) | ORAL | Status: DC
  Administered 2016-09-22 (×2): 10 mg via ORAL
  Filled 2016-09-22 (×3): qty 1

## 2016-09-22 MED ORDER — INSULIN ASPART 100 UNIT/ML FLEXPEN: 5.0000 [IU] | PEN_INJECTOR | SUBCUTANEOUS | Status: DC

## 2016-09-22 MED ORDER — DEXTROSE 5 % IV SOLN
1.0000 g | Freq: Every day | INTRAVENOUS | Status: DC
  Administered 2016-09-22: 1 g via INTRAVENOUS
  Filled 2016-09-22: qty 10

## 2016-09-22 MED ORDER — ASPIRIN EC 81 MG PO TBEC
81.0000 mg | DELAYED_RELEASE_TABLET | Freq: Every day | ORAL | Status: DC
  Administered 2016-09-22: 81 mg via ORAL
  Filled 2016-09-22: qty 1

## 2016-09-22 MED ORDER — DONEPEZIL HCL 10 MG PO TABS
10.0000 mg | ORAL_TABLET | Freq: Every morning | ORAL | Status: DC
  Administered 2016-09-22: 10 mg via ORAL
  Filled 2016-09-22: qty 1

## 2016-09-22 MED ORDER — ALBUTEROL SULFATE (2.5 MG/3ML) 0.083% IN NEBU: 2.5000 mg | INHALATION_SOLUTION | Freq: Four times a day (QID) | RESPIRATORY_TRACT | Status: DC | PRN

## 2016-09-22 MED ORDER — TIOTROPIUM BROMIDE MONOHYDRATE 18 MCG IN CAPS
18.0000 ug | ORAL_CAPSULE | Freq: Every day | RESPIRATORY_TRACT | Status: DC
  Filled 2016-09-22: qty 5

## 2016-09-22 MED ORDER — FENOFIBRATE 160 MG PO TABS
160.0000 mg | ORAL_TABLET | Freq: Every day | ORAL | Status: DC
  Administered 2016-09-22: 160 mg via ORAL
  Filled 2016-09-22: qty 1

## 2016-09-22 MED ORDER — FLUTICASONE FUROATE-VILANTEROL 200-25 MCG/INH IN AEPB
1.0000 | INHALATION_SPRAY | Freq: Every day | RESPIRATORY_TRACT | Status: DC
  Filled 2016-09-22: qty 28

## 2016-09-22 MED ORDER — PANTOPRAZOLE SODIUM 20 MG PO TBEC
20.0000 mg | DELAYED_RELEASE_TABLET | Freq: Every day | ORAL | Status: DC
  Administered 2016-09-22: 20 mg via ORAL
  Filled 2016-09-22: qty 1

## 2016-09-22 MED ORDER — PROSIGHT PO TABS
1.0000 | ORAL_TABLET | Freq: Two times a day (BID) | ORAL | Status: DC
  Administered 2016-09-22: 1 via ORAL
  Filled 2016-09-22: qty 1

## 2016-09-22 MED ORDER — INSULIN ASPART 100 UNIT/ML ~~LOC~~ SOLN
0.0000 [IU] | Freq: Three times a day (TID) | SUBCUTANEOUS | Status: DC
  Administered 2016-09-22 (×2): 1 [IU] via SUBCUTANEOUS

## 2016-09-22 MED ORDER — ENOXAPARIN SODIUM 40 MG/0.4ML ~~LOC~~ SOLN
40.0000 mg | SUBCUTANEOUS | Status: DC
  Administered 2016-09-22: 40 mg via SUBCUTANEOUS
  Filled 2016-09-22: qty 0.4

## 2016-09-22 MED ORDER — INSULIN GLARGINE 100 UNIT/ML ~~LOC~~ SOLN
20.0000 [IU] | Freq: Every day | SUBCUTANEOUS | Status: DC
  Administered 2016-09-22: 20 [IU] via SUBCUTANEOUS
  Filled 2016-09-22 (×2): qty 0.2

## 2016-09-22 MED ORDER — LORATADINE 10 MG PO TABS
10.0000 mg | ORAL_TABLET | Freq: Every day | ORAL | Status: DC
  Administered 2016-09-22: 10 mg via ORAL
  Filled 2016-09-22: qty 1

## 2016-09-22 MED ORDER — PRAVASTATIN SODIUM 40 MG PO TABS
40.0000 mg | ORAL_TABLET | Freq: Every day | ORAL | Status: DC
  Administered 2016-09-22: 40 mg via ORAL
  Filled 2016-09-22: qty 1

## 2016-09-22 MED ORDER — AZITHROMYCIN 250 MG PO TABS: 250.0000 mg | ORAL_TABLET | Freq: Every day | ORAL | 0 refills | Status: AC

## 2016-09-22 MED ORDER — BASAGLAR KWIKPEN 100 UNIT/ML ~~LOC~~ SOPN: 30.0000 [IU] | PEN_INJECTOR | SUBCUTANEOUS | Status: DC

## 2016-09-22 MED ORDER — FUROSEMIDE 40 MG PO TABS
40.0000 mg | ORAL_TABLET | Freq: Every day | ORAL | Status: DC
  Administered 2016-09-22: 40 mg via ORAL
  Filled 2016-09-22: qty 1

## 2016-09-22 MED ORDER — PREDNISONE 20 MG PO TABS: 40.0000 mg | ORAL_TABLET | Freq: Every day | ORAL | 0 refills | Status: AC

## 2016-09-22 MED ORDER — PRESERVISION AREDS 2 PO CAPS: 1.0000 | ORAL_CAPSULE | Freq: Two times a day (BID) | ORAL | Status: DC

## 2016-09-22 MED ORDER — INSULIN ASPART 100 UNIT/ML ~~LOC~~ SOLN
4.0000 [IU] | Freq: Three times a day (TID) | SUBCUTANEOUS | Status: DC
  Administered 2016-09-22 (×3): 4 [IU] via SUBCUTANEOUS

## 2016-09-22 NOTE — Discharge Summary (Signed)
Physician Discharge Summary  Antonio Ray  [id number]  DOB: [birth year]  DOA: 09/21/2016 PCP: [name], MD  Admit date: 09/21/2016 Discharge date: 09/22/2016  Admitted From: Home  Disposition:  Home   Recommendations for Outpatient Follow-up:  1. Follow up with PCP in 1-2 weeks 2. Please obtain BMP/CBC in one week to check kidney function   Discharge Condition: Stable  CODE STATUS: FULL  Diet recommendation: Carb modified   Brief/Interim Summary: 83 y/o M with PMHX of COPD, DM2, and Chronic systolic CHF presented to the ED with c/o SOB and subjective fever. Patient was in his usual state of health and around 4PM of yesterday patient started to be confuse. Patient son decided to bring patient to the ED given his hx of rapid deterioration. Patient was placed in observation for suspected CAP and COPD exacerbation. Patient was started on IV abx and nebulizer. Patient was back to baseline at the time of ED arrival. Pulmonary team was consulted as request of family which recommended, prednisone and azithromycin with outpatient follow up.   Subjective: Patient seen and examined doing well have no complaints. Patient report feeling back to baseline. Saturations up in the 90's in RA patient was walked and saturation remained above 91% on RA Denies chest pain, SOB, cough, nasal congestion and dizziness   Discharge Diagnoses/Hospital Course:   Acute on chronic respiratory failure without hypoxia due to COPD, possible viral vs CAP  Repeat CXR no acute process, WBC trending down. Patient breathing well on RA Azithromycin x 5 days  Prednisone x 5 days  Continue home nebulizer and inhalers  Has an appointment with Dr Lake Bells on 09/23/16 encouraged to keep the appointment  COPD - see above   DM type 2 Resume home meds  CKD stage 3 - Cr at baseline  Follow up with PCP   All other chronic medical condition were stable during the hospitalization.  On the day of the discharge the  patient's vitals were stable, and no other acute medical condition were reported by patient. the patient was felt safe to be discharge to home - Case discussed with son.   Discharge Instructions  You were cared for by a hospitalist during your hospital stay. If you have any questions about your discharge medications or the care you received while you were in the hospital after you are discharged, you can call the unit and asked to speak with the hospitalist on call if the hospitalist that took care of you is not available. Once you are discharged, your primary care physician will handle any further medical issues. Please note that NO REFILLS for any discharge medications will be authorized once you are discharged, as it is imperative that you return to your primary care physician (or establish a relationship with a primary care physician if you do not have one) for your aftercare needs so that they can reassess your need for medications and monitor your lab values.  Discharge Instructions    Call MD for:  difficulty breathing, headache or visual disturbances    Complete by:  As directed    Call MD for:  extreme fatigue    Complete by:  As directed    Call MD for:  hives    Complete by:  As directed    Call MD for:  persistant dizziness or light-headedness    Complete by:  As directed    Call MD for:  persistant nausea and vomiting    Complete by:  As directed  Call MD for:  redness, tenderness, or signs of infection (pain, swelling, redness, odor or green/yellow discharge around incision site)    Complete by:  As directed    Call MD for:  severe uncontrolled pain    Complete by:  As directed    Call MD for:  temperature >100.4    Complete by:  As directed    Diet - low sodium heart healthy    Complete by:  As directed    Increase activity slowly    Complete by:  As directed      Allergies as of 09/22/2016      Reactions   Codeine Other (See Comments)   Gets very angry, disoriented    Dilaudid [hydromorphone Hcl] Other (See Comments)   VERY AGITATED, HOSTILE   Flomax [tamsulosin Hcl] Shortness Of Breath   Morphine And Related Other (See Comments)   VERY AGITATED, HOSTILE   Sulfa Antibiotics Shortness Of Breath   Beta Adrenergic Blockers Other (See Comments)   ? disorientation   Carvedilol Other (See Comments)   DISORIENTATION      Medication List    TAKE these medications   albuterol 108 (90 Base) MCG/ACT inhaler Commonly known as:  PROVENTIL HFA;VENTOLIN HFA Inhale 2 puffs into the lungs every 6 (six) hours as needed for wheezing or shortness of breath.   aspirin 81 MG EC tablet Take 81 mg by mouth daily.   azithromycin 250 MG tablet Commonly known as:  ZITHROMAX Take 1 tablet (250 mg total) by mouth daily at 6 (six) AM. Start taking on:  09/23/2016   BASAGLAR KWIKPEN 100 UNIT/ML Sopn Inject 0.3 mLs (30 Units total) into the skin every morning.   donepezil 10 MG tablet Commonly known as:  ARICEPT Take 1 tablet (10 mg total) by mouth every morning.   fenofibrate 145 MG tablet Commonly known as:  TRICOR TAKE 1 TABLET BY MOUTH EVERY DAY   furosemide 20 MG tablet Commonly known as:  LASIX Take 2 tablets (40 mg total) by mouth daily.   glucose blood test strip Commonly known as:  FREESTYLE TEST STRIPS Use as instructed to check sugar 7 times daily   insulin aspart 100 UNIT/ML FlexPen Commonly known as:  NOVOLOG FLEXPEN Inject 5-6 Units into the skin See admin instructions. Inject 6 units SQ with breakfast, 5 units SQ with lunch and 5 units SQ with dinner What changed:  how much to take  when to take this  additional instructions   Insulin Pen Needle 32G X 5 MM Misc Commonly known as:  CAREFINE PEN NEEDLES Use 4x a day   loratadine 10 MG tablet Commonly known as:  CLARITIN Take 1 tablet (10 mg total) by mouth daily.   memantine 10 MG tablet Commonly known as:  NAMENDA Take one tablet twice daily. Please call [phone] for an  appointment.   nitroGLYCERIN 0.2 mg/hr patch Commonly known as:  NITRODUR - Dosed in mg/24 hr Place 1 patch (0.2 mg total) onto the skin daily.   pantoprazole 20 MG tablet Commonly known as:  PROTONIX Take 1 tablet (20 mg total) by mouth daily.   pravastatin 40 MG tablet Commonly known as:  PRAVACHOL TAKE 1 TABLET(40 MG) BY MOUTH EVERY EVENING   predniSONE 20 MG tablet Commonly known as:  DELTASONE Take 2 tablets (40 mg total) by mouth daily with breakfast.   PRESERVISION AREDS 2 Caps Take 1 capsule by mouth 2 (two) times daily.   SPIRIVA HANDIHALER 18 MCG inhalation capsule Generic  drug:  tiotropium INHALE CONTENTS OF 1 CAPSULE VIA HANDIHALER ONCE DAILY   SYMBICORT 160-4.5 MCG/ACT inhaler Generic drug:  budesonide-formoterol INHALE 2 PUFFS BY MOUTH TWICE DAILY   VITAMIN D PO Take 2,000 Units by mouth daily.      Follow-up Information    Simonne Maffucci, MD. Go in 1 day(s).   Specialty:  Pulmonary Disease Why:  Patient has an appointment tomorrow  Contact information: [place] [address] [phone]          Allergies  Allergen Reactions  . Codeine Other (See Comments)    Gets very angry, disoriented  . Dilaudid [Hydromorphone Hcl] Other (See Comments)    VERY AGITATED, HOSTILE  . Flomax [Tamsulosin Hcl] Shortness Of Breath  . Morphine And Related Other (See Comments)    VERY AGITATED, HOSTILE  . Sulfa Antibiotics Shortness Of Breath  . Beta Adrenergic Blockers Other (See Comments)    ? disorientation  . Carvedilol Other (See Comments)    DISORIENTATION    Consultations:  Pulmonology    Procedures/Studies: Dg Chest 2 View  Result Date: 09/21/2016 CLINICAL DATA:  Fever with weakness EXAM: CHEST  2 VIEW COMPARISON:  04/22/2016 FINDINGS: Left-sided pacing device with leads over the right atrium and right ventricle. Hyperinflation is present. No focal consolidation or pleural effusion. Biapical scarring. Borderline heart size with  atherosclerosis. No pneumothorax. IMPRESSION: Hyperinflation without acute pulmonary infiltrate Electronically Signed   By: Donavan Foil M.D.   On: 09/21/2016 20:19   Dg Chest 2v Repeat Same Day  Result Date: 09/22/2016 CLINICAL DATA:  Shortness of breath. EXAM: CHEST  2 VIEW COMPARISON:  09/21/2016 FINDINGS: Cardiomediastinal silhouette is normal. Mediastinal contours appear intact. There is no evidence of focal airspace consolidation, pleural effusion or pneumothorax. Evaluation of lung parenchyma on the lateral view degraded by motion artifact. Chronic bronchitic changes and upper lobe predominant emphysema. Osseous structures are without acute abnormality. Soft tissues are grossly normal. IMPRESSION: No evidence of lobar consolidation. Chronic bronchitic changes and upper lobe predominant emphysema. Electronically Signed   By: Fidela Salisbury M.D.   On: 09/22/2016 11:08     Discharge Exam: Vitals:   09/22/16 1016 09/22/16 1347  BP: 115/63 114/60  Pulse: 62 69  Resp: 16 17  Temp: 98.1 F (36.7 C) 98.5 F (36.9 C)   Vitals:   09/22/16 0245 09/22/16 0512 09/22/16 1016 09/22/16 1347  BP: (!) 140/59 (!) 115/52 115/63 114/60  Pulse: (!) 59 60 62 69  Resp:  16 16 17   Temp: 97.6 F (36.4 C) 97.7 F (36.5 C) 98.1 F (36.7 C) 98.5 F (36.9 C)  TempSrc: Oral Oral Oral Oral  SpO2: 99% 97% 97% 99%  Weight: 90.2 kg (198 lb 12.8 oz)     Height: 5\' 9"  (1.753 m)       General: Pt is alert, awake, not in acute distress Cardiovascular: RRR, S1/S2 +, no rubs, no gallops Respiratory: BS distant, but no added sound noticed, no wheezing or rales  Abdominal: Soft, NT, ND, bowel sounds + Extremities: no edema, no cyanosis    The results of significant diagnostics from this hospitalization (including imaging, microbiology, ancillary and laboratory) are listed below for reference.     Microbiology: Recent Results (from the past 240 hour(s))  Blood Culture (routine x 2)     Status: None  (Preliminary result)   Collection Time: 09/21/16  7:18 PM  Result Value Ref Range Status   Specimen Description BLOOD  Final   Special Requests  RT IV SITE 5CC  Final   Culture NO GROWTH < 24 HOURS  Final   Report Status PENDING  Incomplete  Blood Culture (routine x 2)     Status: None (Preliminary result)   Collection Time: 09/21/16  7:19 PM  Result Value Ref Range Status   Specimen Description BLOOD  Final   Special Requests LEFT IV SITE 5CC  Final   Culture NO GROWTH < 24 HOURS  Final   Report Status PENDING  Incomplete     Labs: BNP (last 3 results)  Recent Labs  04/21/16 0844  BNP 0000000*   Basic Metabolic Panel:  Recent Labs Lab 09/21/16 1934  NA 140  K 4.0  CL 104  CO2 26  GLUCOSE 212*  BUN 34*  CREATININE 2.10*  CALCIUM 9.3   Liver Function Tests:  Recent Labs Lab 09/21/16 1934  AST 31  ALT 25  ALKPHOS 33*  BILITOT 0.5  PROT 6.2*  ALBUMIN 3.3*   No results for input(s): LIPASE, AMYLASE in the last 168 hours. No results for input(s): AMMONIA in the last 168 hours. CBC:  Recent Labs Lab 09/21/16 1934 09/22/16 1029  WBC 16.9* 12.3*  NEUTROABS 13.6* 8.8*  HGB 13.3 12.7*  HCT 41.6 40.2  MCV 90.4 91.4  PLT 212 202   Cardiac Enzymes: No results for input(s): CKTOTAL, CKMB, CKMBINDEX, TROPONINI in the last 168 hours. BNP: Invalid input(s): POCBNP CBG:  Recent Labs Lab 09/22/16 0250 09/22/16 0824 09/22/16 1204  GLUCAP 221* 114* 131*   D-Dimer No results for input(s): DDIMER in the last 72 hours. Hgb A1c No results for input(s): HGBA1C in the last 72 hours. Lipid Profile No results for input(s): CHOL, HDL, LDLCALC, TRIG, CHOLHDL, LDLDIRECT in the last 72 hours. Thyroid function studies No results for input(s): TSH, T4TOTAL, T3FREE, THYROIDAB in the last 72 hours.  Invalid input(s): FREET3 Anemia work up No results for input(s): VITAMINB12, FOLATE, FERRITIN, TIBC, IRON, RETICCTPCT in the last 72 hours. Urinalysis    Component  Value Date/Time   COLORURINE YELLOW 09/21/2016 1913   APPEARANCEUR CLEAR 09/21/2016 1913   LABSPEC 1.020 09/21/2016 1913   PHURINE 5.0 09/21/2016 1913   GLUCOSEU 150 (A) 09/21/2016 [place] NEGATIVE 09/21/2016 [place] NEGATIVE 09/21/2016 1913   BILIRUBINUR neg 06/06/2012 [place] 09/21/2016 1913   PROTEINUR NEGATIVE 09/21/2016 1913   UROBILINOGEN 0.2 05/12/2015 0920   NITRITE NEGATIVE 09/21/2016 1913   LEUKOCYTESUR NEGATIVE 09/21/2016 1913   Sepsis Labs Invalid input(s): PROCALCITONIN,  WBC,  LACTICIDVEN Microbiology Recent Results (from the past 240 hour(s))  Blood Culture (routine x 2)     Status: None (Preliminary result)   Collection Time: 09/21/16  7:18 PM  Result Value Ref Range Status   Specimen Description BLOOD  Final   Special Requests RT IV SITE 5CC  Final   Culture NO GROWTH < 24 HOURS  Final   Report Status PENDING  Incomplete  Blood Culture (routine x 2)     Status: None (Preliminary result)   Collection Time: 09/21/16  7:19 PM  Result Value Ref Range Status   Specimen Description BLOOD  Final   Special Requests LEFT IV SITE 5CC  Final   Culture NO GROWTH < 24 HOURS  Final   Report Status PENDING  Incomplete     Time coordinating discharge: Over 30 minutes  SIGNED:  Chipper Oman, MD  [facility] 09/22/2016, 4:24 PM  Pager please text page via  [email or web address]  Password [email or web address]

## 2016-09-22 NOTE — H&P (Signed)
History and Physical    Antonio Ray:[id number] DOB: 1933/09/26 DOA: 09/21/2016   PCP: [name], MD Chief Complaint:  Chief Complaint  Patient presents with  . Fever    HPI: Antonio Ray is a 83 y.o. male with medical history significant of COPD, recurrent aspiration PNA, DM2.  Patient presents to the ED with c/o AMS.  After family noticed that patient was acting strange (which he typically does with COPD exacerbation or PNA), they took his temperature and it was running 100.62F.  Mild cough and SOB, no headache, no diarrhea, no CP.  Tried nothing for symptoms at home.  Family brought him in to ED due to his history of deteriorating rapidly in the past including during hospital admits (see 03/31/2015 admission).  Family requests tele monitor due to this history of rapid decompensation.  ED Course: WBC 16.9k, CXR neg, creat is baseline at 2.1 (has CKD stage 3), Satting low 90s on RA.  Patient given Zosyn by EDP.  Review of Systems: As per HPI otherwise 10 point review of systems negative.    Past Medical History:  Diagnosis Date  . Achalasia   . AICD (automatic cardioverter/defibrillator) present 03/30/2011   Analyze ST study patient  . Anginal pain ([place])   . BPH (benign prostatic hyperplasia)   . CHF (congestive heart failure) ([place])   . Chronic systolic dysfunction of left ventricle    EF 30-35%, CLASS II - III SYMPTOMS; intolerant to Coreg  . CKD (chronic kidney disease) stage 3, GFR 30-59 ml/min 06/08/2012  . COPD (chronic obstructive pulmonary disease) ([place])   . Coronary artery disease    History of remote anterior MI with PCI to LAD in 2006; most recent cath 2007, no intervention required  . DOE (dyspnea on exertion)    with heavy exertion  . Dysrhythmia   . Esophageal dysmotility   . GERD (gastroesophageal reflux disease)   . Head injury, closed, with concussion 2000ish  . Heart murmur    hx  . HOH (hard of hearing)   . Hyperlipidemia   . Memory loss    improved  . Myocardial infarction 1994   ANTERIOR  . Pneumonia "several times"   aspiration pna with at least 3 admits for this 2016.   Marland Kitchen PVC's (premature ventricular contractions)   . Renal failure   . Skin cancer "several"   "forearms; head"  . Type II diabetes mellitus ([place])     Past Surgical History:  Procedure Laterality Date  . BALLOON DILATION N/A 09/09/2015   Procedure: BALLOON DILATION;  Surgeon: Mauri Pole, MD;  Location: [facility];  Service: Endoscopy;  Laterality: N/A;  rigiflex achalasia balloon dilators  . BOTOX INJECTION N/A 04/08/2015   Procedure: BOTOX INJECTION;  Surgeon: Milus Banister, MD;  Location: [facility];  Service: Endoscopy;  Laterality: N/A;  . CARDIAC CATHETERIZATION  01/11/2006   DEMONSTRATES AKINESIA OF THE DISTAL ANTERIOR WALL, DISTAL INFERIOR WALL AND AKINESIA OF THE APEX. THE BASAL SEGMENTS CONTRACT WELL AND OVERALL EF 35%  . CATARACT EXTRACTION W/ INTRAOCULAR LENS  IMPLANT, BILATERAL  08/2014-09/2014  . CHOLECYSTECTOMY  2/11  . CORONARY ANGIOPLASTY  1994   TO THE LAD  . ESOPHAGEAL MANOMETRY N/A 03/16/2015   Procedure: ESOPHAGEAL MANOMETRY (EM);  Surgeon: Jerene Bears, MD;  Location: [facility];  Service: Gastroenterology;  Laterality: N/A;  . ESOPHAGOGASTRODUODENOSCOPY N/A 04/08/2015   Procedure: ESOPHAGOGASTRODUODENOSCOPY (EGD);  Surgeon: Milus Banister, MD;  Location: [facility];  Service: Endoscopy;  Laterality:  N/A;  . ESOPHAGOGASTRODUODENOSCOPY (EGD) WITH PROPOFOL N/A 09/09/2015   Procedure: ESOPHAGOGASTRODUODENOSCOPY (EGD) WITH PROPOFOL;  Surgeon: Mauri Pole, MD;  Location: [facility];  Service: Endoscopy;  Laterality: N/A;  pt. is to have gastrografin xray post recovery-do not discharge until results are back  . FOOT FRACTURE SURGERY Right 1980's  . INSERT / REPLACE / REMOVE PACEMAKER    . SKIN CANCER EXCISION  "several"   "forearms, head"     reports that he quit smoking about 24 years ago. His smoking use included  Cigarettes. He has a 35.00 pack-year smoking history. He has never used smokeless tobacco. He reports that he does not drink alcohol or use drugs.  Allergies  Allergen Reactions  . Codeine Other (See Comments)    Gets very angry, disoriented  . Dilaudid [Hydromorphone Hcl] Other (See Comments)    VERY AGITATED, HOSTILE  . Flomax [Tamsulosin Hcl] Shortness Of Breath  . Morphine And Related Other (See Comments)    VERY AGITATED, HOSTILE  . Sulfa Antibiotics Shortness Of Breath  . Beta Adrenergic Blockers Other (See Comments)    ? disorientation  . Carvedilol Other (See Comments)    DISORIENTATION    Family History  Problem Relation Age of Onset  . Stroke Father     Mother history unknown - never met her  . Colon cancer Neg Hx       Prior to Admission medications   Medication Sig Start Date End Date Taking? Authorizing Provider  albuterol (PROVENTIL HFA;VENTOLIN HFA) 108 (90 Base) MCG/ACT inhaler Inhale 2 puffs into the lungs every 6 (six) hours as needed for wheezing or shortness of breath. 04/22/16   Eugenie Filler, MD  aspirin 81 MG EC tablet Take 81 mg by mouth daily.      Historical Provider, MD  Cholecalciferol (VITAMIN D PO) Take 2,000 Units by mouth daily.     Historical Provider, MD  donepezil (ARICEPT) 10 MG tablet Take 1 tablet (10 mg total) by mouth every morning. 08/18/15   Marcial Pacas, MD  fenofibrate (TRICOR) 145 MG tablet TAKE 1 TABLET BY MOUTH EVERY DAY 11/24/15   Peter M Martinique, MD  furosemide (LASIX) 20 MG tablet Take 2 tablets (40 mg total) by mouth daily. 10/30/15   Peter M Martinique, MD  glucose blood (FREESTYLE TEST STRIPS) test strip Use as instructed to check sugar 7 times daily 08/08/16   Philemon Kingdom, MD  insulin aspart (NOVOLOG FLEXPEN) 100 UNIT/ML FlexPen Inject 7-10 Units into the skin 3 (three) times daily before meals. 07/19/16   Philemon Kingdom, MD  Insulin Glargine (BASAGLAR KWIKPEN) 100 UNIT/ML SOPN Inject 0.3 mLs (30 Units total) into the skin at  bedtime. 07/19/16   Philemon Kingdom, MD  Insulin Pen Needle (CAREFINE PEN NEEDLES) 32G X 5 MM MISC Use 4x a day 07/19/16   Philemon Kingdom, MD  loratadine (CLARITIN) 10 MG tablet Take 1 tablet (10 mg total) by mouth daily. 07/02/15   Barton Dubois, MD  memantine (NAMENDA) 10 MG tablet Take one tablet twice daily. Please call [phone] for an appointment. 08/22/16   Marcial Pacas, MD  Multiple Vitamins-Minerals (PRESERVISION AREDS 2) CAPS Take 1 capsule by mouth 2 (two) times daily.    Historical Provider, MD  nitroGLYCERIN (NITRODUR - DOSED IN MG/24 HR) 0.2 mg/hr patch Place 1 patch (0.2 mg total) onto the skin daily. 09/15/15   Peter M Martinique, MD  pantoprazole (PROTONIX) 20 MG tablet Take 1 tablet (20 mg total) by mouth daily. 08/26/15  Mauri Pole, MD  pravastatin (PRAVACHOL) 40 MG tablet TAKE 1 TABLET(40 MG) BY MOUTH EVERY EVENING 06/27/16   Peter M Martinique, MD  SPIRIVA HANDIHALER 18 MCG inhalation capsule INHALE CONTENTS OF 1 CAPSULE VIA HANDIHALER ONCE DAILY 09/05/16   Juanito Doom, MD  SYMBICORT 160-4.5 MCG/ACT inhaler INHALE 2 PUFFS BY MOUTH TWICE DAILY 05/30/16   Juanito Doom, MD    Physical Exam: Vitals:   09/22/16 0000 09/22/16 0015 09/22/16 0030 09/22/16 0045  BP: 111/96 129/72 125/59 126/59  Pulse: (!) 59 (!) 57 (!) 59 (!) 59  Resp: _0 Temp:    97.9 F (36.6 C)  TempSrc:    Oral  SpO2: 93% 93% 94% 95%  Weight:      Height:          Constitutional: NAD, calm, comfortable Eyes: PERRL, lids and conjunctivae normal ENMT: Mucous membranes are moist. Posterior pharynx clear of any exudate or lesions.Normal dentition.  Neck: normal, supple, no masses, no thyromegaly Respiratory: clear to auscultation bilaterally, no wheezing, no crackles. Normal respiratory effort. No accessory muscle use.  Cardiovascular: Regular rate and rhythm, no murmurs / rubs / gallops. No extremity edema. 2+ pedal pulses. No carotid bruits.  Abdomen: no tenderness, no masses palpated.  No hepatosplenomegaly. Bowel sounds positive.  Musculoskeletal: no clubbing / cyanosis. No joint deformity upper and lower extremities. Good ROM, no contractures. Normal muscle tone.  Skin: no rashes, lesions, ulcers. No induration Neurologic: CN 2-12 grossly intact. Sensation intact, DTR normal. Strength 5/5 in all 4.  Psychiatric: Normal judgment and insight. Alert and oriented x 3. Normal mood.    Labs on Admission: I have personally reviewed following labs and imaging studies  CBC:  Recent Labs Lab 09/21/16 1934  WBC 16.9*  NEUTROABS 13.6*  HGB 13.3  HCT 41.6  MCV 90.4  PLT 092   Basic Metabolic Panel:  Recent Labs Lab 09/21/16 1934  NA 140  K 4.0  CL 104  CO2 26  GLUCOSE 212*  BUN 34*  CREATININE 2.10*  CALCIUM 9.3   GFR: Estimated Creatinine Clearance: 30.3 mL/min (by C-G formula based on SCr of 2.1 mg/dL (H)). Liver Function Tests:  Recent Labs Lab 09/21/16 1934  AST 31  ALT 25  ALKPHOS 33*  BILITOT 0.5  PROT 6.2*  ALBUMIN 3.3*   No results for input(s): LIPASE, AMYLASE in the last 168 hours. No results for input(s): AMMONIA in the last 168 hours. Coagulation Profile: No results for input(s): INR, PROTIME in the last 168 hours. Cardiac Enzymes: No results for input(s): CKTOTAL, CKMB, CKMBINDEX, TROPONINI in the last 168 hours. BNP (last 3 results) No results for input(s): PROBNP in the last 8760 hours. HbA1C: No results for input(s): HGBA1C in the last 72 hours. CBG: No results for input(s): GLUCAP in the last 168 hours. Lipid Profile: No results for input(s): CHOL, HDL, LDLCALC, TRIG, CHOLHDL, LDLDIRECT in the last 72 hours. Thyroid Function Tests: No results for input(s): TSH, T4TOTAL, FREET4, T3FREE, THYROIDAB in the last 72 hours. Anemia Panel: No results for input(s): VITAMINB12, FOLATE, FERRITIN, TIBC, IRON, RETICCTPCT in the last 72 hours. Urine analysis:    Component Value Date/Time   COLORURINE YELLOW 09/21/2016 1913    APPEARANCEUR CLEAR 09/21/2016 1913   LABSPEC 1.020 09/21/2016 1913   PHURINE 5.0 09/21/2016 1913   GLUCOSEU 150 (A) 09/21/2016 [place] NEGATIVE 09/21/2016 [place] NEGATIVE 09/21/2016 1913   BILIRUBINUR neg 06/06/2012 [place]  NEGATIVE 09/21/2016 1913   PROTEINUR NEGATIVE 09/21/2016 1913   UROBILINOGEN 0.2 05/12/2015 0920   NITRITE NEGATIVE 09/21/2016 1913   LEUKOCYTESUR NEGATIVE 09/21/2016 1913   Sepsis Labs: _0 (procalcitonin:4,lacticidven:4) ) Recent Results (from the past 240 hour(s))  Blood Culture (routine x 2)     Status: None (Preliminary result)   Collection Time: 09/21/16  7:19 PM  Result Value Ref Range Status   Specimen Description BLOOD  Final   Special Requests LEFT IV SITE 5CC  Final   Culture PENDING  Incomplete   Report Status PENDING  Incomplete     Radiological Exams on Admission: Dg Chest 2 View  Result Date: 09/21/2016 CLINICAL DATA:  Fever with weakness EXAM: CHEST  2 VIEW COMPARISON:  04/22/2016 FINDINGS: Left-sided pacing device with leads over the right atrium and right ventricle. Hyperinflation is present. No focal consolidation or pleural effusion. Biapical scarring. Borderline heart size with atherosclerosis. No pneumothorax. IMPRESSION: Hyperinflation without acute pulmonary infiltrate Electronically Signed   By: Donavan Foil M.D.   On: 09/21/2016 20:19    EKG: Independently reviewed.  Assessment/Plan Principal Problem:   CAP (community acquired pneumonia) Active Problems:   COPD GOLD IV   Chronic kidney disease, stage III (moderate)   Poorly controlled type 2 diabetes mellitus with circulatory disorder ([name])   Chronic systolic congestive heart failure ([place])    1. CAP - 1. Will de-escalate zosyn to rocephin and azithromycin for now 2. PNA pathway 3. Cultures pending 4. Overall exam not that impressive but hard to argue with his pulmonary history which is extensively documented in chart. 5. So will keep for  observation 6. Will put on tele monitor and continuous pulse ox at request of family (again see the Aug 2016 admit), as I dont really have a strong reason to deny this request given the history as he has demonstrated he can crash on floor. 2. COPD - 1. Continue home nebs 2. No wheezing at this point so will hold off on adding systemic steroids 3. DM2 - 1. Will put patient on lantus 20 units QHS 2. 4 novolog with meals 3. Sensitive scale SSI AC 4. CKD stage 3 - chronic and baseline 5. Chronic systolic CHF - continue home meds   DVT prophylaxis: Lovenox Code Status: Full Family Communication: Family at bedside Consults called: None Admission status: Place in [place], [facility] Pager [phone] from 7PM-7AM  If 7AM-7PM, please contact the day physician for the patient [email or web address] Password [email or web address]  09/22/2016, 1:35 AM

## 2016-09-22 NOTE — Care Management Obs Status (Signed)
[facility] NOTIFICATION   Patient Details  Name: Antonio Ray MRN: [id number] Date of Birth: September 05, 1933   Medicare Observation Status Notification Given:  Yes    Carles Collet, RN 09/22/2016, 2:30 PM

## 2016-09-22 NOTE — Consult Note (Signed)
Name: Antonio Ray MRN: [id number] DOB: January 04, 1934    ADMISSION DATE:  09/21/2016 CONSULTATION DATE:   2/22  REFERRING MD : Quincy Simmonds   CHIEF COMPLAINT:   Dyspnea   BRIEF PATIENT DESCRIPTION:  This is a 83 yom f/b BM for GOLD IV COPD. Was in usual state of health up until 2/21. Per the patient had been walking outside. Was short of breath (but initially not much worse than usual). His phone was ringing when he got home and he could not find it. He got progressively short of breath and by the time he spoke to his son he was apparently fairly confused (per family). At family request he went ER was found to have low grade temp & leukocytosis. CXR was clear. Was admitted w/ working dx of AECOPD more at the request of his family given his labile history. He was placed on empiric abx, systemic steroids, scheduled nebs, and observation. Pulm asked to see re: further recs and to determine if further in-patient care required.   PAST MEDICAL HISTORY :   has a past medical history of Achalasia; AICD (automatic cardioverter/defibrillator) present (03/30/2011); Anginal pain ([name]); BPH (benign prostatic hyperplasia); CHF (congestive heart failure) ([place]); Chronic systolic dysfunction of left ventricle; CKD (chronic kidney disease) stage 3, GFR 30-59 ml/min (06/08/2012); COPD (chronic obstructive pulmonary disease) ([place]); Coronary artery disease; DOE (dyspnea on exertion); Dysrhythmia; Esophageal dysmotility; GERD (gastroesophageal reflux disease); Head injury, closed, with concussion (2000ish); Heart murmur; HOH (hard of hearing); Hyperlipidemia; Memory loss; Myocardial infarction (1994); Pneumonia ("several times"); PVC's (premature ventricular contractions); Renal failure; Skin cancer ("several"); and Type II diabetes mellitus ([place]).  has a past surgical history that includes Foot fracture surgery (Right, 1980's); Cholecystectomy (2/11); Cataract extraction w/ intraocular lens  implant, bilateral (08/2014-09/2014);  Cardiac catheterization (01/11/2006); Coronary angioplasty (1994); Skin cancer excision ("several"); Esophageal manometry (N/A, 03/16/2015); Insert / replace / remove pacemaker; Esophagogastroduodenoscopy (N/A, 04/08/2015); Botox injection (N/A, 04/08/2015); Esophagogastroduodenoscopy (egd) with propofol (N/A, 09/09/2015); and Balloon dilation (N/A, 09/09/2015). Prior to Admission medications   Medication Sig Start Date End Date Taking? Authorizing Provider  aspirin 81 MG EC tablet Take 81 mg by mouth daily.     Yes Historical Provider, MD  Cholecalciferol (VITAMIN D PO) Take 2,000 Units by mouth daily.    Yes Historical Provider, MD  donepezil (ARICEPT) 10 MG tablet Take 1 tablet (10 mg total) by mouth every morning. 08/18/15  Yes Marcial Pacas, MD  fenofibrate (TRICOR) 145 MG tablet TAKE 1 TABLET BY MOUTH EVERY DAY 11/24/15  Yes Peter M Martinique, MD  furosemide (LASIX) 20 MG tablet Take 2 tablets (40 mg total) by mouth daily. 10/30/15  Yes Peter M Martinique, MD  insulin aspart (NOVOLOG FLEXPEN) 100 UNIT/ML FlexPen Inject 7-10 Units into the skin 3 (three) times daily before meals. Patient taking differently: Inject 5-6 Units into the skin See admin instructions. Inject 6 units SQ with breakfast, 5 units SQ with lunch and 5 units SQ with dinner 07/19/16  Yes Philemon Kingdom, MD  Insulin Glargine (BASAGLAR KWIKPEN) 100 UNIT/ML SOPN Inject 0.3 mLs (30 Units total) into the skin at bedtime. Patient taking differently: Inject 30 Units into the skin every morning.  07/19/16  Yes Philemon Kingdom, MD  loratadine (CLARITIN) 10 MG tablet Take 1 tablet (10 mg total) by mouth daily. 07/02/15  Yes Barton Dubois, MD  memantine (NAMENDA) 10 MG tablet Take one tablet twice daily. Please call [phone] for an appointment. 08/22/16  Yes Marcial Pacas, MD  Multiple Vitamins-Minerals (PRESERVISION  AREDS 2) CAPS Take 1 capsule by mouth 2 (two) times daily.   Yes Historical Provider, MD  nitroGLYCERIN (NITRODUR - DOSED IN MG/24 HR) 0.2 mg/hr patch  Place 1 patch (0.2 mg total) onto the skin daily. 09/15/15  Yes Peter M Martinique, MD  pantoprazole (PROTONIX) 20 MG tablet Take 1 tablet (20 mg total) by mouth daily. 08/26/15  Yes Mauri Pole, MD  pravastatin (PRAVACHOL) 40 MG tablet TAKE 1 TABLET(40 MG) BY MOUTH EVERY EVENING 06/27/16  Yes Peter M Martinique, MD  SPIRIVA HANDIHALER 18 MCG inhalation capsule INHALE CONTENTS OF 1 CAPSULE VIA HANDIHALER ONCE DAILY 09/05/16  Yes Juanito Doom, MD  SYMBICORT 160-4.5 MCG/ACT inhaler INHALE 2 PUFFS BY MOUTH TWICE DAILY 05/30/16  Yes Juanito Doom, MD  albuterol (PROVENTIL HFA;VENTOLIN HFA) 108 (90 Base) MCG/ACT inhaler Inhale 2 puffs into the lungs every 6 (six) hours as needed for wheezing or shortness of breath. 04/22/16   Eugenie Filler, MD  glucose blood (FREESTYLE TEST STRIPS) test strip Use as instructed to check sugar 7 times daily Patient not taking: Reported on 09/22/2016 08/08/16   Philemon Kingdom, MD  Insulin Pen Needle (CAREFINE PEN NEEDLES) 32G X 5 MM MISC Use 4x a day Patient not taking: Reported on 09/22/2016 07/19/16   Philemon Kingdom, MD   Allergies  Allergen Reactions  . Codeine Other (See Comments)    Gets very angry, disoriented  . Dilaudid [Hydromorphone Hcl] Other (See Comments)    VERY AGITATED, HOSTILE  . Flomax [Tamsulosin Hcl] Shortness Of Breath  . Morphine And Related Other (See Comments)    VERY AGITATED, HOSTILE  . Sulfa Antibiotics Shortness Of Breath  . Beta Adrenergic Blockers Other (See Comments)    ? disorientation  . Carvedilol Other (See Comments)    DISORIENTATION    FAMILY HISTORY:  family history includes Stroke in his father. SOCIAL HISTORY:  reports that he quit smoking about 24 years ago. His smoking use included Cigarettes. He has a 35.00 pack-year smoking history. He has never used smokeless tobacco. He reports that he does not drink alcohol or use drugs.  REVIEW OF SYSTEMS:   Constitutional:low grade fever, no chills, weight loss,  malaise/fatigue and diaphoresis.  HENT: Negative for hearing loss, ear pain, nosebleeds, congestion, sore throat, neck pain, tinnitus and ear discharge.   Eyes: Negative for blurred vision, double vision, photophobia, pain, discharge and redness.  Respiratory: Negative for cough, hemoptysis, sputum production, + shortness of breath, wheezing and stridor.   Cardiovascular: Negative for chest pain, palpitations, orthopnea, claudication, leg swelling and PND.  Gastrointestinal: Negative for heartburn, nausea, vomiting, abdominal pain, diarrhea, constipation, blood in stool and melena.  Genitourinary: Negative for dysuria, urgency, frequency, hematuria and flank pain.  Musculoskeletal: Negative for myalgias, back pain, joint pain and falls.  Skin: Negative for itching and rash.  Neurological: Negative for dizziness, tingling, tremors, sensory change, speech change, focal weakness, seizures, loss of consciousness, weakness and headaches.  Endo/Heme/Allergies: Negative for environmental allergies and polydipsia. Does not bruise/bleed easily.  SUBJECTIVE:  Feels better  VITAL SIGNS: Temp:  [97.6 F (36.4 C)-100.3 F (37.9 C)] 98.5 F (36.9 C) (02/22 1347) Pulse Rate:  [57-79] 69 (02/22 1347) Resp:  [16-30] 17 (02/22 1347) BP: (96-140)/(41-96) 114/60 (02/22 1347) SpO2:  [91 %-99 %] 99 % (02/22 1347) Weight:  [194 lb (88 kg)-198 lb 12.8 oz (90.2 kg)] 198 lb 12.8 oz (90.2 kg) (02/22 0245) Room air  PHYSICAL EXAMINATION: General:  83 year old male resting in  bed. No acute distress.  Neuro:  Awake, alert, no focal def. HEENT:  NCAT, no JVD  Cardiovascular:  Rrr, no MRG, LE edema  Lungs:  Crackles in bases, exp wheeze.  Abdomen:  Soft, not tender + bowel sounds  Musculoskeletal:  Equal st and bulk  Skin:  Warm and dry    Recent Labs Lab 09/21/16 1934  NA 140  K 4.0  CL 104  CO2 26  BUN 34*  CREATININE 2.10*  GLUCOSE 212*    Recent Labs Lab 09/21/16 1934 09/22/16 1029  HGB 13.3  12.7*  HCT 41.6 40.2  WBC 16.9* 12.3*  PLT 212 202   Dg Chest 2 View  Result Date: 09/21/2016 CLINICAL DATA:  Fever with weakness EXAM: CHEST  2 VIEW COMPARISON:  04/22/2016 FINDINGS: Left-sided pacing device with leads over the right atrium and right ventricle. Hyperinflation is present. No focal consolidation or pleural effusion. Biapical scarring. Borderline heart size with atherosclerosis. No pneumothorax. IMPRESSION: Hyperinflation without acute pulmonary infiltrate Electronically Signed   By: Donavan Foil M.D.   On: 09/21/2016 20:19   Dg Chest 2v Repeat Same Day  Result Date: 09/22/2016 CLINICAL DATA:  Shortness of breath. EXAM: CHEST  2 VIEW COMPARISON:  09/21/2016 FINDINGS: Cardiomediastinal silhouette is normal. Mediastinal contours appear intact. There is no evidence of focal airspace consolidation, pleural effusion or pneumothorax. Evaluation of lung parenchyma on the lateral view degraded by motion artifact. Chronic bronchitic changes and upper lobe predominant emphysema. Osseous structures are without acute abnormality. Soft tissues are grossly normal. IMPRESSION: No evidence of lobar consolidation. Chronic bronchitic changes and upper lobe predominant emphysema. Electronically Signed   By: Fidela Salisbury M.D.   On: 09/22/2016 11:08  no infiltrates on film   ASSESSMENT / PLAN:  GOLD IV COPD Likely chronic respiratory failure  Possible AECOPD EF 0000000  Chronic systolic and diastolic heart failure  Discussion.  He looks fairly good. May be some degree of acute exacerbation but given the review of events this more c/w chronic hypoxia and probably some degree of volume overload.  Plan I think reasonable to dc w/ pred taper, resume home BDs and 5d total of azith.  Most importantly he needs walking oximetry to look for desaturation Already has f/u this week in our office. Would keep this appointment.   Erick Colace ACNP-BC [place] Pager #  [phone] OR # [phone] if no answer     09/22/2016, 3:00 PM   STAFF NOTE: I, Merrie Roof, MD FACP have personally reviewed patient's available data, including medical history, events of note, physical examination and test results as part of my evaluation. I have discussed with resident/NP and other care providers such as pharmacist, RN and RRT. In addition, I personally evaluated patient and elicited key findings of: awake, alert, no distress in chair, distant BS but clear without active wheezing and ronchi, no sig edema, no reported CP, lactic did have some mild elevation and with history sounds like he did have exacerbation of his lung dz, possible with atypical illness, it is reasonable to give azithro x 5 days, can dc ceftraixone, pred needed and likely what has improved his status, would dc him on taper steroids over 1 week and see Dr [place] Bells as outpt in 2 weeks, no repeat films needed, very important that we walk him to assess his sats and tolerability , if he does poorly then we should assess other causes for his acute event, CT chest angio , trop etc, will sign  off, son not available he was at an appt,   Lavon Paganini. Titus Mould, MD, [place] Pgr: [facility] 09/22/2016 4:37 PM

## 2016-09-22 NOTE — ED Notes (Addendum)
Admitting Provider at bedside. 

## 2016-09-22 NOTE — Progress Notes (Signed)
Patient walked in the hallway with pulse ox, he stayed in between 91-99% on RA.

## 2016-09-22 NOTE — Progress Notes (Signed)
Patient Discharge: Disposition: Patient discharged to home. Education: reviewed medications, follow-up appointment, prescriptions, and discharge instructions,understood and acknowledged. IV: Discontinued IV before discharge, Site clean and dry. Telemetry: Tele removed before discharge, CCMD notified. Transportation: Patient escorted out of the unit in w/c accompanied by the family and son. Belongings: patient took all his belongings with him.

## 2016-09-23 ENCOUNTER — Encounter: Payer: Self-pay | Admitting: Pulmonary Disease

## 2016-09-23 ENCOUNTER — Ambulatory Visit (INDEPENDENT_AMBULATORY_CARE_PROVIDER_SITE_OTHER): Payer: Medicare Other | Admitting: Pulmonary Disease

## 2016-09-23 DIAGNOSIS — J449 Chronic obstructive pulmonary disease, unspecified: Secondary | ICD-10-CM | POA: Diagnosis not present

## 2016-09-23 DIAGNOSIS — I255 Ischemic cardiomyopathy: Secondary | ICD-10-CM

## 2016-09-23 NOTE — Patient Instructions (Signed)
In regards to your prednisone: Tomorrow take 20 mg, Sunday take 20 mg, then stop Take Spiriva daily Take Symbicort twice a day Stay active Follow-up 3 months or sooner if needed

## 2016-09-23 NOTE — Assessment & Plan Note (Signed)
He has severe COPD but I can't find evidence of an exacerbation on my exam.  I can't say for certain why he had the confusion or fever a few days ago, but I suspect some degree of low level infection may have contributed.  At this point he doesn't have a COPD exacerbation.  On my exam today he has no wheezing.  Plan: Quickly taper off prednisone  Continue spiriva and symbicort F/u 3 months Stay active  > 15 minutes spent face to face with the patient and his son, 27 minute visit

## 2016-09-23 NOTE — Progress Notes (Signed)
Subjective:    Patient ID: Antonio Ray, male    DOB: 1933-08-10, 83 y.o.   MRN: [id number]  Synopsis: First the [facility] pulmonary clinic in 2015 for severe COPD. His FEV1 at that time was 29% predicted. He also has a scar in his right lung base believed to be related to pneumonia. He also has a history of achalasia and was admitted for healthcare associated pneumonia multiple times in 2016. He underwent Botox injections in his distal esophagus in late 2016.  HPI  Chief Complaint  Patient presents with  . Follow-up    pt feeling well today.  was admitted to [facility] for fatigue, sob X2 days ago.    Antonio Ray was hospitalized yesterday with fever, confusion, and dyspnea.   He never felt wheezing or congestion.    He said that he thought he had "overdone it" and this lead to his illness.  He said that he walked across his 4 acres and walked about 2 miles.  He says he had been doing that more in the last few days, but he had been laying off this in the winter time.  He isn't sure if someone around him has been sick.  They went to a funeral a week ago and he had been out with friends recently, no one was clearly sick but he had been out in public.   His son notes that on Wednesday he was a little more winded than normal.  His family thinks that this may have related ot the change in the weather.  He says that his O2 saturation had been 95-98% on RA lately.  Some mild swelling in his right ankle.  He didn't feel right around lunch time on Wednesday.    Past Medical History:  Diagnosis Date  . Achalasia   . AICD (automatic cardioverter/defibrillator) present 03/30/2011   Analyze ST study patient  . Anginal pain ([place])   . BPH (benign prostatic hyperplasia)   . CHF (congestive heart failure) ([place])   . Chronic systolic dysfunction of left ventricle    EF 30-35%, CLASS II - III SYMPTOMS; intolerant to Coreg  . CKD (chronic kidney disease) stage 3, GFR 30-59 ml/min 06/08/2012  . COPD (chronic  obstructive pulmonary disease) ([place])   . Coronary artery disease    History of remote anterior MI with PCI to LAD in 2006; most recent cath 2007, no intervention required  . DOE (dyspnea on exertion)    with heavy exertion  . Dysrhythmia   . Esophageal dysmotility   . GERD (gastroesophageal reflux disease)   . Head injury, closed, with concussion 2000ish  . Heart murmur    hx  . HOH (hard of hearing)   . Hyperlipidemia   . Memory loss    improved  . Myocardial infarction 1994   ANTERIOR  . Pneumonia "several times"   aspiration pna with at least 3 admits for this 2016.   Marland Kitchen PVC's (premature ventricular contractions)   . Renal failure   . Skin cancer "several"   "forearms; head"  . Type II diabetes mellitus ([name])      Review of Systems     Objective:   Physical Exam  Vitals:   09/23/16 1353  BP: 126/64  BP Location: Left Arm  Cuff Size: Normal  Pulse: 71  SpO2: 96%  Weight: 202 lb (91.6 kg)  Height: 5' 8.5" (1.74 m)  Ra  Gen: well appearing HENT: OP clear, TM's clear, neck supple PULM: Slight wheeze R  base, improved with breathing B, normal percussion CV: RRR, no mgr, trace edema GI: BS+, soft, nontender Derm: no cyanosis or rash Psyche: normal mood and affect   Records from yesterday's visit with the hospital reviewed, he was admitted in the setting of shortness of breath and some confusion. Pulmonary was consulted and he was treated with a prednisone taper for the possibility of a COPD exacerbation.  CXR feb 2018 reviewed: emphysema, no other pulmonary parenchymal abnormality     Assessment & Plan:   COPD GOLD IV He has severe COPD but I can't find evidence of an exacerbation on my exam.  I can't say for certain why he had the confusion or fever a few days ago, but I suspect some degree of low level infection may have contributed.  At this point he doesn't have a COPD exacerbation.  On my exam today he has no wheezing.  Plan: Quickly taper off  prednisone  Continue spiriva and symbicort F/u 3 months Stay active  > 15 minutes spent face to face with the patient and his son, 27 minute visit    Updated Medication List Outpatient Encounter Prescriptions as of 09/23/2016  Medication Sig  . albuterol (PROVENTIL HFA;VENTOLIN HFA) 108 (90 Base) MCG/ACT inhaler Inhale 2 puffs into the lungs every 6 (six) hours as needed for wheezing or shortness of breath.  Marland Kitchen aspirin 81 MG EC tablet Take 81 mg by mouth daily.    Marland Kitchen azithromycin (ZITHROMAX) 250 MG tablet Take 1 tablet (250 mg total) by mouth daily at 6 (six) AM.  . Cholecalciferol (VITAMIN D PO) Take 2,000 Units by mouth daily.   Marland Kitchen donepezil (ARICEPT) 10 MG tablet Take 1 tablet (10 mg total) by mouth every morning.  . fenofibrate (TRICOR) 145 MG tablet TAKE 1 TABLET BY MOUTH EVERY DAY  . furosemide (LASIX) 20 MG tablet Take 2 tablets (40 mg total) by mouth daily.  Marland Kitchen glucose blood (FREESTYLE TEST STRIPS) test strip Use as instructed to check sugar 7 times daily  . insulin aspart (NOVOLOG FLEXPEN) 100 UNIT/ML FlexPen Inject 5-6 Units into the skin See admin instructions. Inject 6 units SQ with breakfast, 5 units SQ with lunch and 5 units SQ with dinner  . Insulin Glargine (BASAGLAR KWIKPEN) 100 UNIT/ML SOPN Inject 0.3 mLs (30 Units total) into the skin every morning.  . Insulin Pen Needle (CAREFINE PEN NEEDLES) 32G X 5 MM MISC Use 4x a day  . loratadine (CLARITIN) 10 MG tablet Take 1 tablet (10 mg total) by mouth daily.  . memantine (NAMENDA) 10 MG tablet Take one tablet twice daily. Please call [phone] for an appointment.  . Multiple Vitamins-Minerals (PRESERVISION AREDS 2) CAPS Take 1 capsule by mouth 2 (two) times daily.  . nitroGLYCERIN (NITRODUR - DOSED IN MG/24 HR) 0.2 mg/hr patch Place 1 patch (0.2 mg total) onto the skin daily.  . pantoprazole (PROTONIX) 20 MG tablet Take 1 tablet (20 mg total) by mouth daily.  . pravastatin (PRAVACHOL) 40 MG tablet TAKE 1 TABLET(40 MG) BY MOUTH  EVERY EVENING  . predniSONE (DELTASONE) 20 MG tablet Take 2 tablets (40 mg total) by mouth daily with breakfast.  . SPIRIVA HANDIHALER 18 MCG inhalation capsule INHALE CONTENTS OF 1 CAPSULE VIA HANDIHALER ONCE DAILY  . SYMBICORT 160-4.5 MCG/ACT inhaler INHALE 2 PUFFS BY MOUTH TWICE DAILY  . [DISCONTINUED] insulin aspart (NOVOLOG FLEXPEN) 100 UNIT/ML FlexPen Inject 7-10 Units into the skin 3 (three) times daily before meals. (Patient taking differently: Inject 5-6 Units into the  skin See admin instructions. Inject 6 units SQ with breakfast, 5 units SQ with lunch and 5 units SQ with dinner)  . [DISCONTINUED] Insulin Glargine (BASAGLAR KWIKPEN) 100 UNIT/ML SOPN Inject 0.3 mLs (30 Units total) into the skin at bedtime. (Patient taking differently: Inject 30 Units into the skin every morning. )  . [DISCONTINUED] albuterol (PROVENTIL) (2.5 MG/3ML) 0.083% nebulizer solution 2.5 mg   . [DISCONTINUED] aspirin EC tablet 81 mg   . [DISCONTINUED] azithromycin (ZITHROMAX) tablet 500 mg   . [DISCONTINUED] cefTRIAXone (ROCEPHIN) 1 g in dextrose 5 % 50 mL IVPB   . [DISCONTINUED] donepezil (ARICEPT) tablet 10 mg   . [DISCONTINUED] enoxaparin (LOVENOX) injection 40 mg   . [DISCONTINUED] fenofibrate tablet 160 mg   . [DISCONTINUED] fluticasone furoate-vilanterol (BREO ELLIPTA) 200-25 MCG/INH 1 puff   . [DISCONTINUED] furosemide (LASIX) tablet 40 mg   . [DISCONTINUED] insulin aspart (novoLOG) injection 0-9 Units   . [DISCONTINUED] insulin aspart (novoLOG) injection 4 Units   . [DISCONTINUED] insulin glargine (LANTUS) injection 20 Units   . [DISCONTINUED] loratadine (CLARITIN) tablet 10 mg   . [DISCONTINUED] memantine (NAMENDA) tablet 10 mg   . [DISCONTINUED] multivitamin (PROSIGHT) tablet 1 tablet   . [DISCONTINUED] pantoprazole (PROTONIX) EC tablet 20 mg   . [DISCONTINUED] pravastatin (PRAVACHOL) tablet 40 mg   . [DISCONTINUED] tiotropium (SPIRIVA) inhalation capsule 18 mcg    No facility-administered  encounter medications on file as of 09/23/2016.

## 2016-09-26 LAB — CULTURE, BLOOD (ROUTINE X 2)
CULTURE: NO GROWTH
Culture: NO GROWTH

## 2016-09-26 IMAGING — CR DG CHEST 1V PORT
1 series · 1 of 1 positions shown · non-contrast
Comparison: 03/22/2015

CLINICAL DATA: Shortness of breath, wheeze and cough for 2 days.

EXAM:
PORTABLE CHEST - 1 VIEW

[AP]
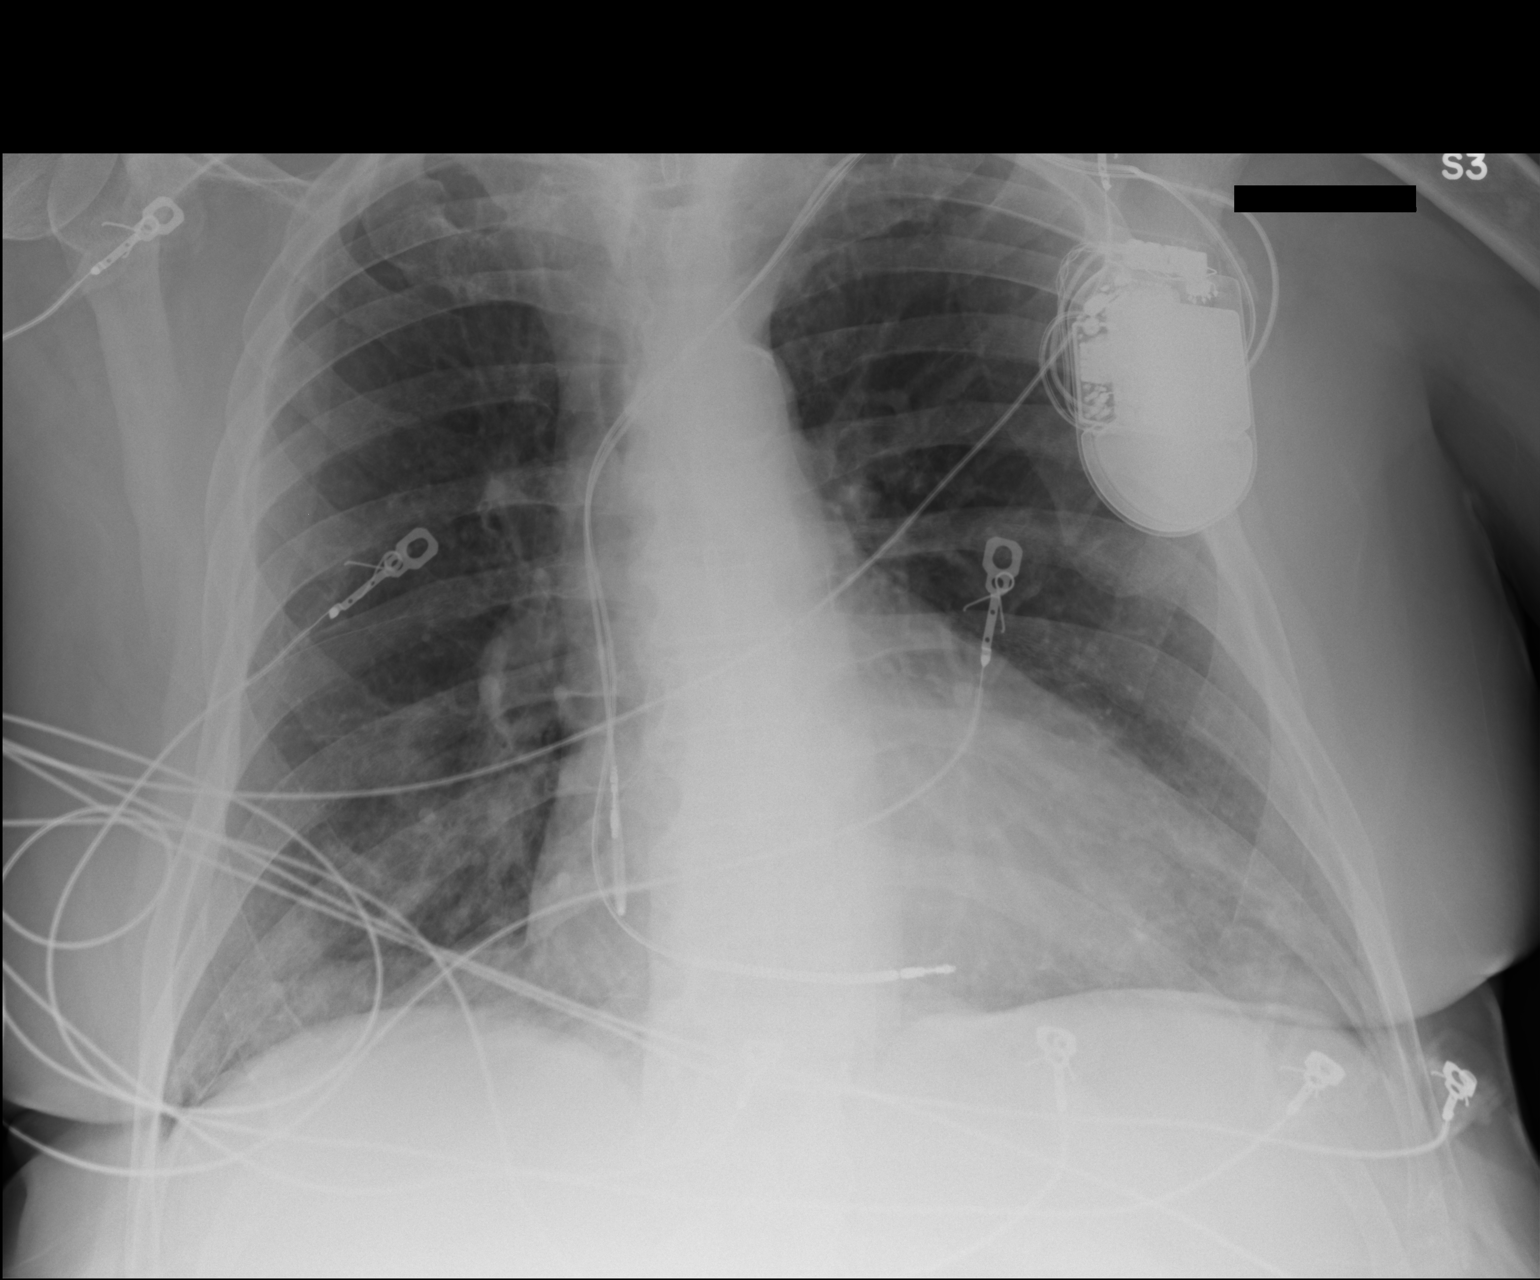

[1 of 1 positions shown; findings below may reference images not displayed]

FINDINGS: Chronic cardiomegaly with stable and negative aortic and hilar
contours. Dual-chamber pacer leads from the left are in stable
position.

Patchy airspace disease at the right base, new. No cavitation or
effusion. Background hyperinflation.
IMPRESSION: Right basilar pneumonia.

## 2016-09-28 IMAGING — DX DG CHEST 1V PORT
1 series · 1 of 1 positions shown · non-contrast
Comparison: Single view of the chest earlier today.

CLINICAL DATA: Status post central line placement.

EXAM:
PORTABLE CHEST - 1 VIEW

[chest ap]
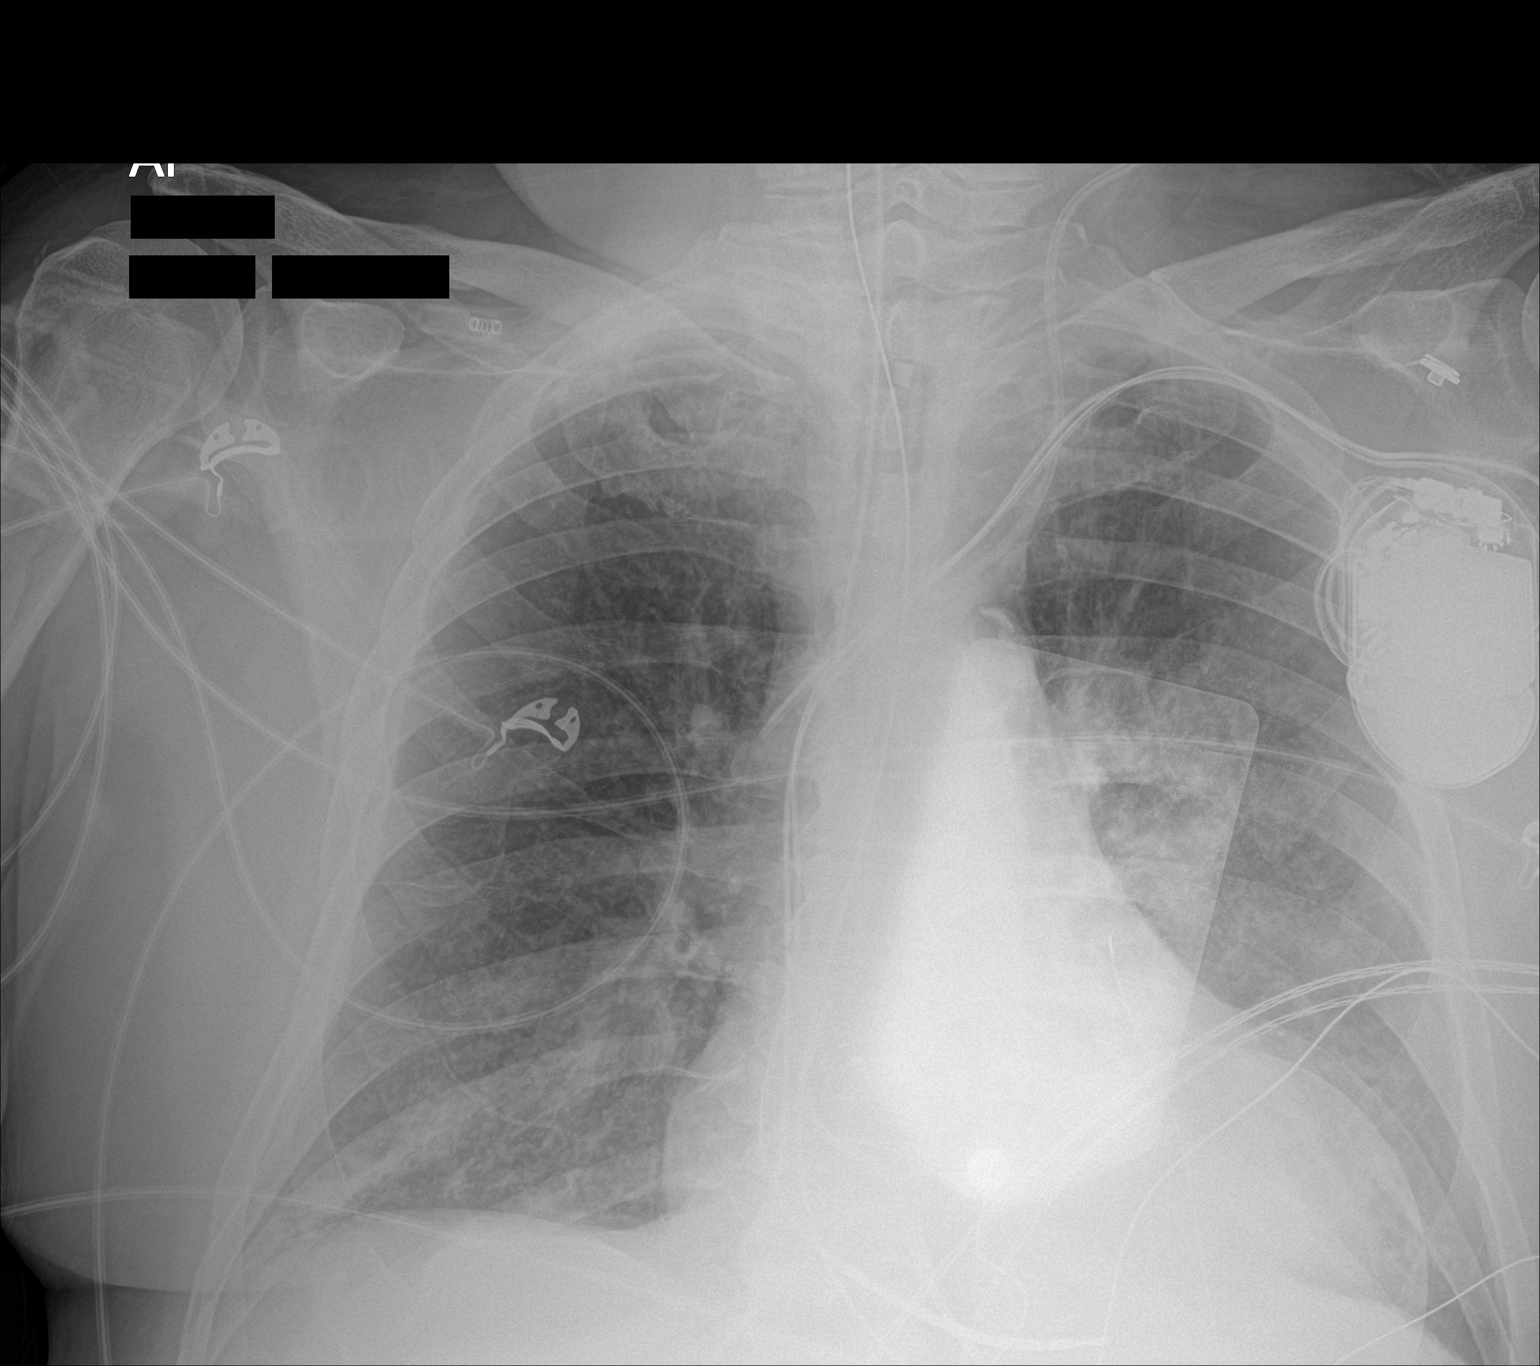

[1 of 1 positions shown; findings below may reference images not displayed]

FINDINGS: A new left IJ catheter is in place with the tip projecting over the
mid to lower superior vena cava. Support apparatus is otherwise
unchanged. There is no pneumothorax. The lungs are clear. Heart size
is normal.
IMPRESSION: Left IJ catheter tip projects over the mid to lower superior vena
cava. Negative for pneumothorax. No other change.

## 2016-09-28 IMAGING — CR DG CHEST 1V PORT
1 series · 1 of 1 positions shown · non-contrast
Comparison: 04/01/2015

CLINICAL DATA: Dyspnea, onset tonight.

EXAM:
PORTABLE CHEST - 1 VIEW

[AP]
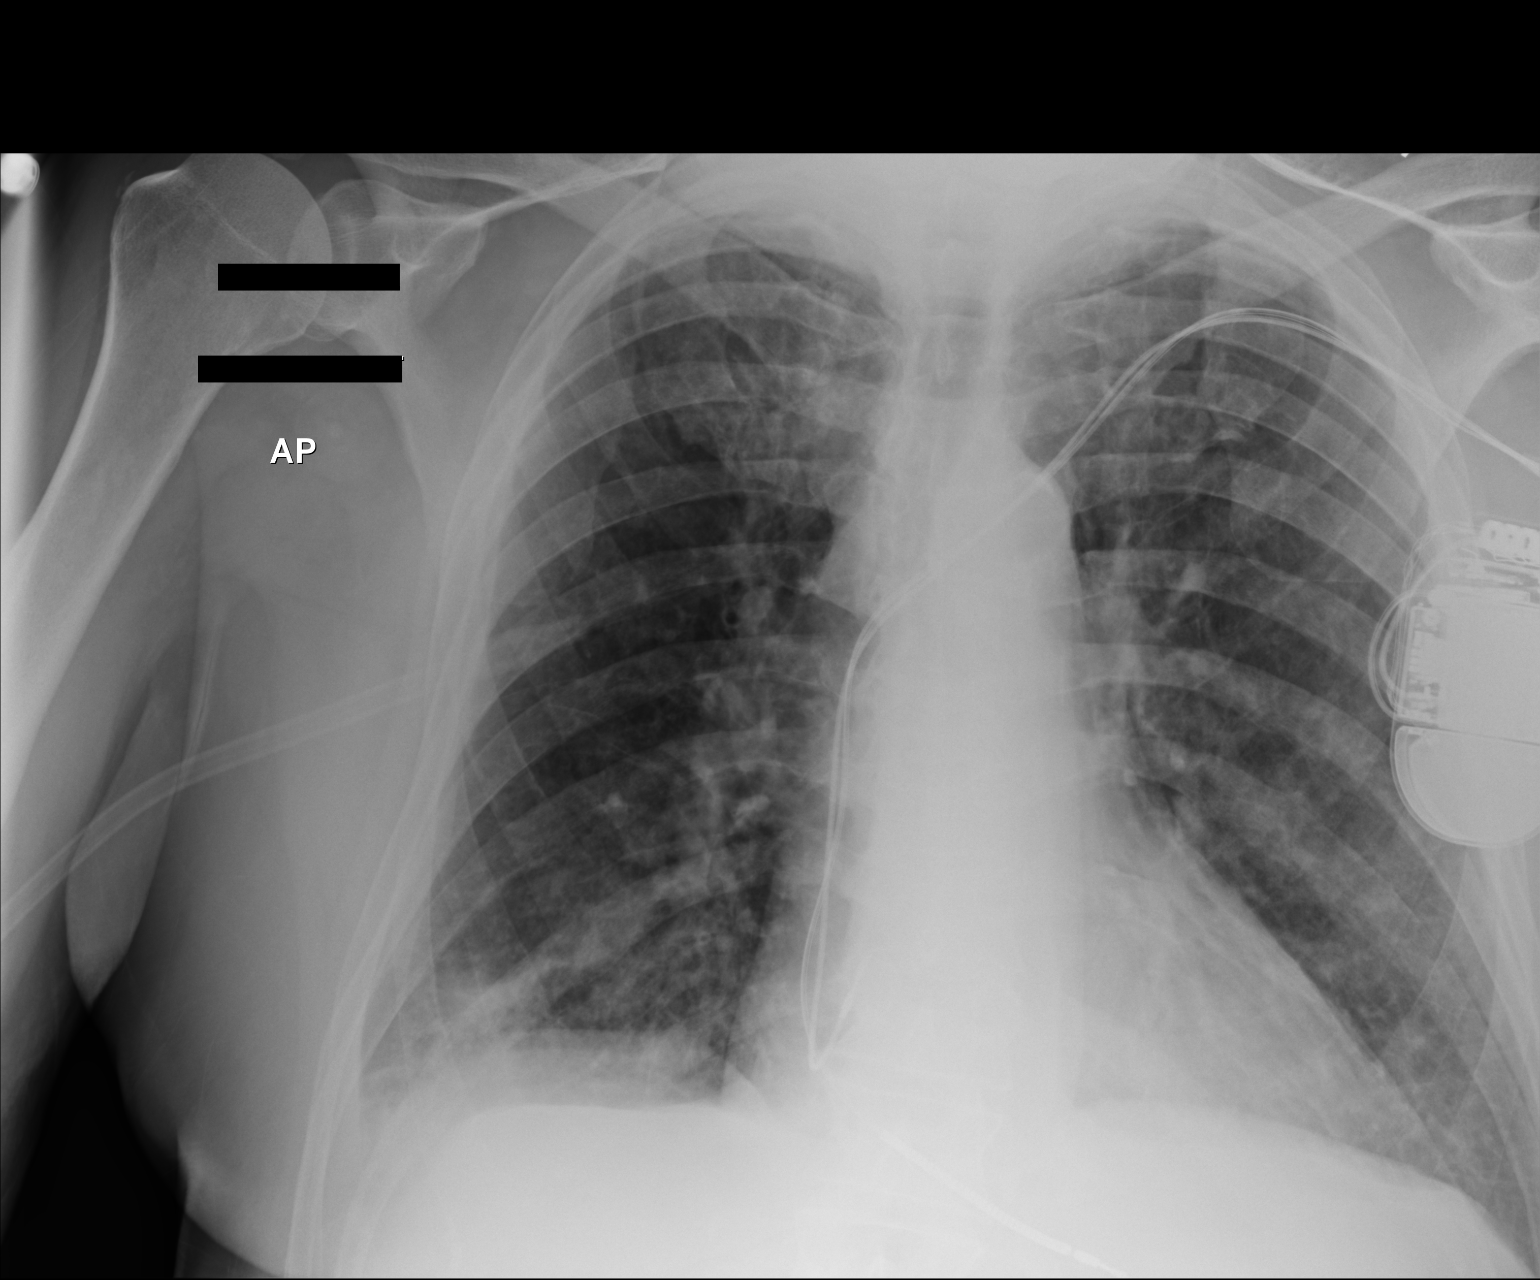

[1 of 1 positions shown; findings below may reference images not displayed]

FINDINGS: There is developing basilar opacity on the right which could
represent infectious infiltrate. No large effusion is evident. Mild
interstitial and vascular prominence is present.
IMPRESSION: Developing patchy right base opacity which could represent
pneumonia. Noninfectious atelectasis is also possibility.

## 2016-09-28 IMAGING — DX DG CHEST 1V PORT
1 series · 1 of 1 positions shown · non-contrast
Comparison: Study obtained earlier in the day

CLINICAL DATA: Hypoxia

EXAM:
PORTABLE CHEST - 1 VIEW

[chest ap]
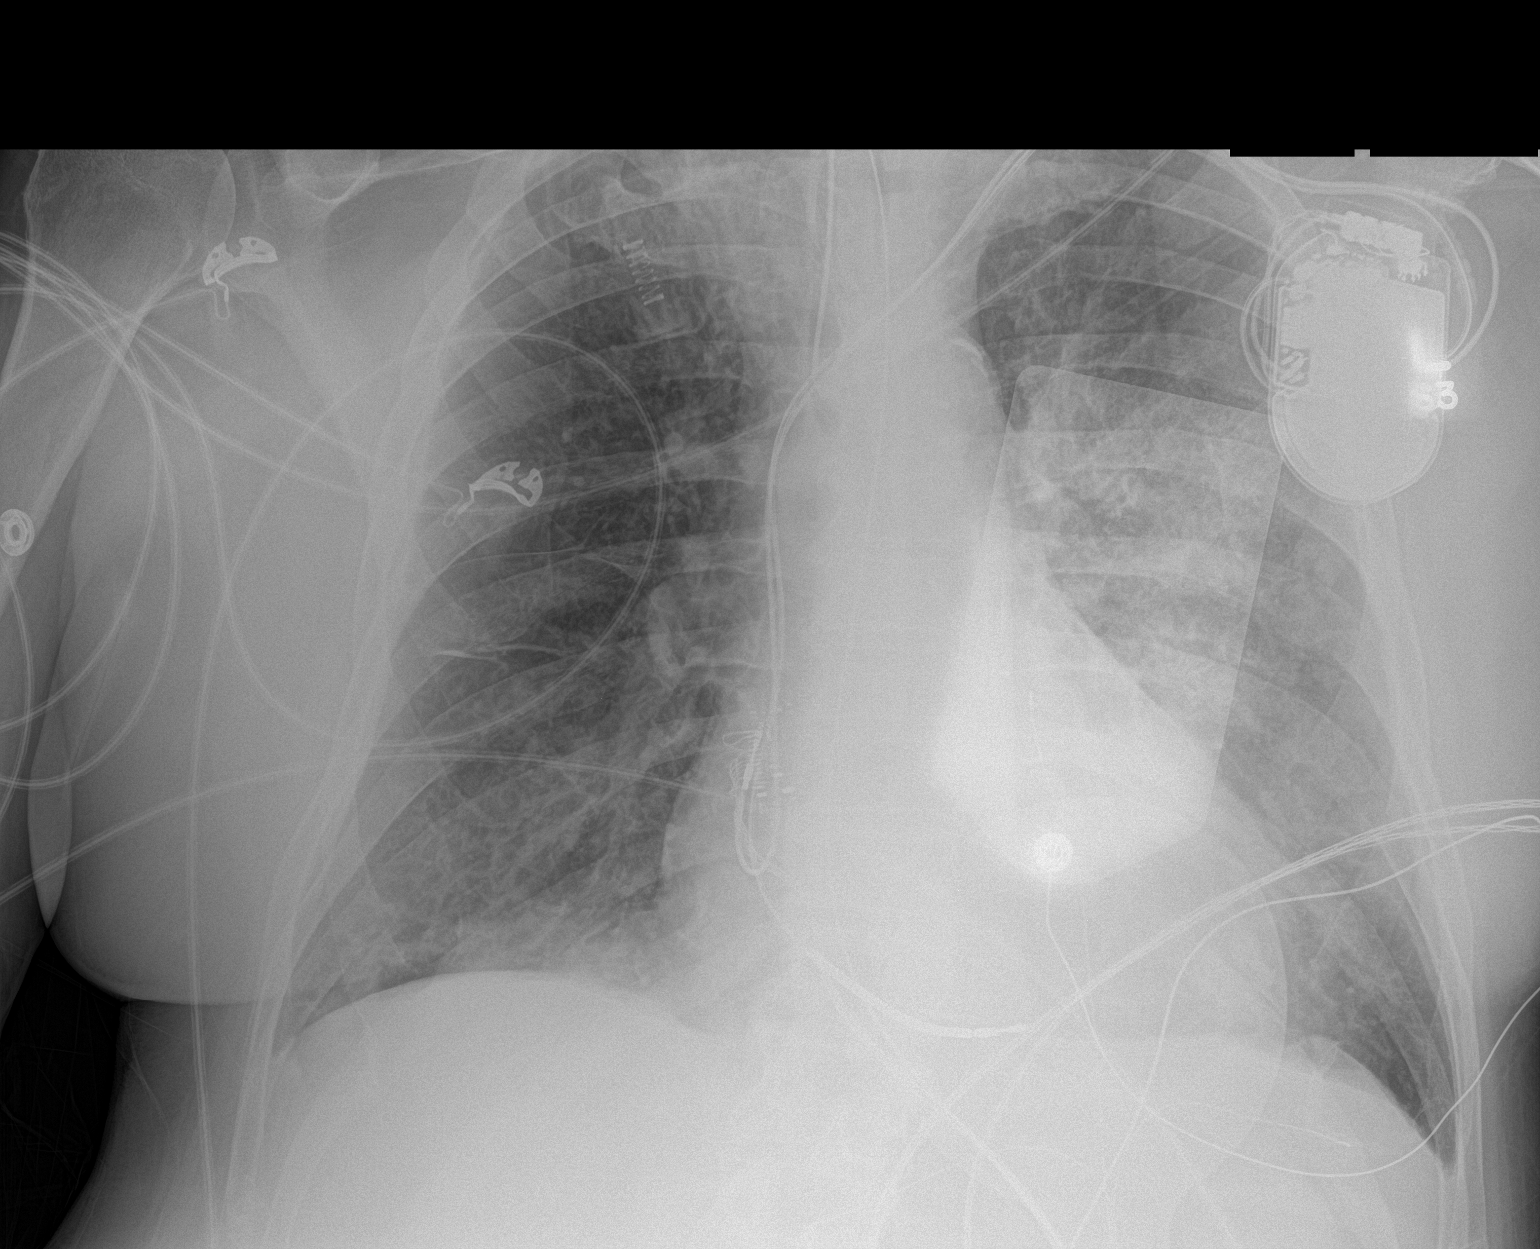

[1 of 1 positions shown; findings below may reference images not displayed]

FINDINGS: Endotracheal tube tip is 2.8 cm above the carina. Nasogastric tube
tip and side port are in the stomach. Pacemaker leads are attached
to the right atrium and right ventricle. No pneumothorax. There is
no edema or consolidation. The heart is upper normal in size with
pulmonary vascularity within normal limits. No adenopathy. There is
atherosclerotic calcification in the aortic arch region.
IMPRESSION: Tube positions as described without pneumothorax. No edema or
consolidation. The ill-defined opacity in the right base seen
earlier in the day is not appreciable on this examination. Cardiac
silhouette within normal limits.

## 2016-10-05 DIAGNOSIS — Z85828 Personal history of other malignant neoplasm of skin: Secondary | ICD-10-CM | POA: Diagnosis not present

## 2016-10-05 DIAGNOSIS — D225 Melanocytic nevi of trunk: Secondary | ICD-10-CM | POA: Diagnosis not present

## 2016-10-05 DIAGNOSIS — L821 Other seborrheic keratosis: Secondary | ICD-10-CM | POA: Diagnosis not present

## 2016-10-05 DIAGNOSIS — Z23 Encounter for immunization: Secondary | ICD-10-CM | POA: Diagnosis not present

## 2016-10-05 DIAGNOSIS — D485 Neoplasm of uncertain behavior of skin: Secondary | ICD-10-CM | POA: Diagnosis not present

## 2016-10-05 DIAGNOSIS — C4442 Squamous cell carcinoma of skin of scalp and neck: Secondary | ICD-10-CM | POA: Diagnosis not present

## 2016-10-05 DIAGNOSIS — L570 Actinic keratosis: Secondary | ICD-10-CM | POA: Diagnosis not present

## 2016-10-06 DIAGNOSIS — L97512 Non-pressure chronic ulcer of other part of right foot with fat layer exposed: Secondary | ICD-10-CM | POA: Diagnosis not present

## 2016-10-06 DIAGNOSIS — L84 Corns and callosities: Secondary | ICD-10-CM | POA: Diagnosis not present

## 2016-10-06 DIAGNOSIS — I739 Peripheral vascular disease, unspecified: Secondary | ICD-10-CM | POA: Diagnosis not present

## 2016-10-06 DIAGNOSIS — E1151 Type 2 diabetes mellitus with diabetic peripheral angiopathy without gangrene: Secondary | ICD-10-CM | POA: Diagnosis not present

## 2016-10-11 ENCOUNTER — Other Ambulatory Visit: Payer: Self-pay

## 2016-10-11 ENCOUNTER — Telehealth: Payer: Self-pay | Admitting: Internal Medicine

## 2016-10-11 MED ORDER — BASAGLAR KWIKPEN 100 UNIT/ML ~~LOC~~ SOPN: 30.0000 [IU] | PEN_INJECTOR | SUBCUTANEOUS | 0 refills | Status: DC

## 2016-10-11 NOTE — Telephone Encounter (Signed)
Called son [name], advised that we had submitted rx that was needed.

## 2016-10-11 NOTE — Telephone Encounter (Signed)
Pt's son called in and said that patient is needing the Basaglar refilled, [facility] sent it to the PCP that was prescribing it and they would like for Dr. Cruzita Lederer to continue the script since she is seeing her for his diabetes. Pt's son requests a call back when this is taken care of.

## 2016-10-25 ENCOUNTER — Telehealth: Payer: Self-pay | Admitting: Gastroenterology

## 2016-10-26 DIAGNOSIS — L97511 Non-pressure chronic ulcer of other part of right foot limited to breakdown of skin: Secondary | ICD-10-CM | POA: Diagnosis not present

## 2016-10-26 NOTE — Telephone Encounter (Signed)
The appointment with Nicoletta Ba PA-C is earlier than I can get him in. This is the best option to get him in quickly.

## 2016-11-01 ENCOUNTER — Other Ambulatory Visit: Payer: Self-pay

## 2016-11-01 MED ORDER — PRAVASTATIN SODIUM 40 MG PO TABS: 40.0000 mg | ORAL_TABLET | Freq: Every day | ORAL | 1 refills | Status: DC

## 2016-11-11 ENCOUNTER — Encounter (HOSPITAL_COMMUNITY): Payer: Self-pay | Admitting: Emergency Medicine

## 2016-11-11 ENCOUNTER — Telehealth: Payer: Self-pay | Admitting: Pulmonary Disease

## 2016-11-11 ENCOUNTER — Inpatient Hospital Stay (HOSPITAL_COMMUNITY)
Admission: EM | Admit: 2016-11-11 | Discharge: 2016-11-14 | DRG: 871 | Disposition: A | Discharge status: Home / Self Care | Payer: Medicare Other | Attending: Family Medicine | Admitting: Family Medicine

## 2016-11-11 ENCOUNTER — Emergency Department (HOSPITAL_COMMUNITY): Payer: Medicare Other

## 2016-11-11 DIAGNOSIS — J189 Pneumonia, unspecified organism: Secondary | ICD-10-CM

## 2016-11-11 DIAGNOSIS — I255 Ischemic cardiomyopathy: Secondary | ICD-10-CM | POA: Diagnosis not present

## 2016-11-11 DIAGNOSIS — E785 Hyperlipidemia, unspecified: Secondary | ICD-10-CM | POA: Diagnosis present

## 2016-11-11 DIAGNOSIS — A419 Sepsis, unspecified organism: Secondary | ICD-10-CM | POA: Diagnosis not present

## 2016-11-11 DIAGNOSIS — E1122 Type 2 diabetes mellitus with diabetic chronic kidney disease: Secondary | ICD-10-CM | POA: Diagnosis not present

## 2016-11-11 DIAGNOSIS — Z85828 Personal history of other malignant neoplasm of skin: Secondary | ICD-10-CM

## 2016-11-11 DIAGNOSIS — I2510 Atherosclerotic heart disease of native coronary artery without angina pectoris: Secondary | ICD-10-CM | POA: Diagnosis present

## 2016-11-11 DIAGNOSIS — Z87891 Personal history of nicotine dependence: Secondary | ICD-10-CM

## 2016-11-11 DIAGNOSIS — I5042 Chronic combined systolic (congestive) and diastolic (congestive) heart failure: Secondary | ICD-10-CM | POA: Diagnosis present

## 2016-11-11 DIAGNOSIS — E1165 Type 2 diabetes mellitus with hyperglycemia: Secondary | ICD-10-CM | POA: Diagnosis present

## 2016-11-11 DIAGNOSIS — K219 Gastro-esophageal reflux disease without esophagitis: Secondary | ICD-10-CM | POA: Diagnosis present

## 2016-11-11 DIAGNOSIS — J42 Unspecified chronic bronchitis: Secondary | ICD-10-CM

## 2016-11-11 DIAGNOSIS — E1159 Type 2 diabetes mellitus with other circulatory complications: Secondary | ICD-10-CM | POA: Diagnosis present

## 2016-11-11 DIAGNOSIS — K220 Achalasia of cardia: Secondary | ICD-10-CM | POA: Diagnosis not present

## 2016-11-11 DIAGNOSIS — I25118 Atherosclerotic heart disease of native coronary artery with other forms of angina pectoris: Secondary | ICD-10-CM | POA: Diagnosis not present

## 2016-11-11 DIAGNOSIS — Z881 Allergy status to other antibiotic agents status: Secondary | ICD-10-CM

## 2016-11-11 DIAGNOSIS — Z6833 Body mass index (BMI) 33.0-33.9, adult: Secondary | ICD-10-CM | POA: Diagnosis not present

## 2016-11-11 DIAGNOSIS — Z961 Presence of intraocular lens: Secondary | ICD-10-CM | POA: Diagnosis present

## 2016-11-11 DIAGNOSIS — G9340 Encephalopathy, unspecified: Secondary | ICD-10-CM

## 2016-11-11 DIAGNOSIS — N179 Acute kidney failure, unspecified: Secondary | ICD-10-CM | POA: Diagnosis present

## 2016-11-11 DIAGNOSIS — J302 Other seasonal allergic rhinitis: Secondary | ICD-10-CM | POA: Diagnosis present

## 2016-11-11 DIAGNOSIS — Z888 Allergy status to other drugs, medicaments and biological substances status: Secondary | ICD-10-CM

## 2016-11-11 DIAGNOSIS — I252 Old myocardial infarction: Secondary | ICD-10-CM | POA: Diagnosis not present

## 2016-11-11 DIAGNOSIS — Z794 Long term (current) use of insulin: Secondary | ICD-10-CM

## 2016-11-11 DIAGNOSIS — R6521 Severe sepsis with septic shock: Secondary | ICD-10-CM | POA: Diagnosis present

## 2016-11-11 DIAGNOSIS — Z823 Family history of stroke: Secondary | ICD-10-CM

## 2016-11-11 DIAGNOSIS — H9190 Unspecified hearing loss, unspecified ear: Secondary | ICD-10-CM | POA: Diagnosis present

## 2016-11-11 DIAGNOSIS — R1310 Dysphagia, unspecified: Secondary | ICD-10-CM

## 2016-11-11 DIAGNOSIS — R402411 Glasgow coma scale score 13-15, in the field [EMT or ambulance]: Secondary | ICD-10-CM | POA: Diagnosis not present

## 2016-11-11 DIAGNOSIS — J690 Pneumonitis due to inhalation of food and vomit: Secondary | ICD-10-CM | POA: Diagnosis present

## 2016-11-11 DIAGNOSIS — G3184 Mild cognitive impairment, so stated: Secondary | ICD-10-CM | POA: Diagnosis present

## 2016-11-11 DIAGNOSIS — Z885 Allergy status to narcotic agent status: Secondary | ICD-10-CM

## 2016-11-11 DIAGNOSIS — N183 Chronic kidney disease, stage 3 (moderate): Secondary | ICD-10-CM | POA: Diagnosis present

## 2016-11-11 DIAGNOSIS — N400 Enlarged prostate without lower urinary tract symptoms: Secondary | ICD-10-CM | POA: Diagnosis present

## 2016-11-11 DIAGNOSIS — R509 Fever, unspecified: Secondary | ICD-10-CM | POA: Diagnosis not present

## 2016-11-11 DIAGNOSIS — Z7951 Long term (current) use of inhaled steroids: Secondary | ICD-10-CM

## 2016-11-11 DIAGNOSIS — Z79899 Other long term (current) drug therapy: Secondary | ICD-10-CM

## 2016-11-11 DIAGNOSIS — J432 Centrilobular emphysema: Secondary | ICD-10-CM

## 2016-11-11 DIAGNOSIS — Z95810 Presence of automatic (implantable) cardiac defibrillator: Secondary | ICD-10-CM

## 2016-11-11 DIAGNOSIS — R011 Cardiac murmur, unspecified: Secondary | ICD-10-CM | POA: Diagnosis present

## 2016-11-11 DIAGNOSIS — Z7982 Long term (current) use of aspirin: Secondary | ICD-10-CM

## 2016-11-11 DIAGNOSIS — Z9049 Acquired absence of other specified parts of digestive tract: Secondary | ICD-10-CM

## 2016-11-11 DIAGNOSIS — J449 Chronic obstructive pulmonary disease, unspecified: Secondary | ICD-10-CM | POA: Diagnosis present

## 2016-11-11 LAB — CBC WITH DIFFERENTIAL/PLATELET
BASOS ABS: 0 10*3/uL (ref 0.0–0.1)
BASOS PCT: 0 %
EOS ABS: 0 10*3/uL (ref 0.0–0.7)
EOS PCT: 0 %
HCT: 42.9 % (ref 39.0–52.0)
Hemoglobin: 13.5 g/dL (ref 13.0–17.0)
Lymphocytes Relative: 5 %
Lymphs Abs: 0.8 10*3/uL (ref 0.7–4.0)
MCH: 28.4 pg (ref 26.0–34.0)
MCHC: 31.5 g/dL (ref 30.0–36.0)
MCV: 90.1 fL (ref 78.0–100.0)
MONO ABS: 1.2 10*3/uL — AB (ref 0.1–1.0)
Monocytes Relative: 7 %
Neutro Abs: 15.2 10*3/uL — ABNORMAL HIGH (ref 1.7–7.7)
Neutrophils Relative %: 88 %
PLATELETS: 195 10*3/uL (ref 150–400)
RBC: 4.76 MIL/uL (ref 4.22–5.81)
RDW: 14 % (ref 11.5–15.5)
WBC: 17.2 10*3/uL — AB (ref 4.0–10.5)

## 2016-11-11 LAB — CBC
HEMATOCRIT: 40.4 % (ref 39.0–52.0)
HEMOGLOBIN: 12.6 g/dL — AB (ref 13.0–17.0)
MCH: 28.4 pg (ref 26.0–34.0)
MCHC: 31.2 g/dL (ref 30.0–36.0)
MCV: 91 fL (ref 78.0–100.0)
Platelets: 179 10*3/uL (ref 150–400)
RBC: 4.44 MIL/uL (ref 4.22–5.81)
RDW: 14.4 % (ref 11.5–15.5)
WBC: 18.3 10*3/uL — AB (ref 4.0–10.5)

## 2016-11-11 LAB — COMPREHENSIVE METABOLIC PANEL
ALBUMIN: 3.3 g/dL — AB (ref 3.5–5.0)
ALK PHOS: 35 U/L — AB (ref 38–126)
ALT: 24 U/L (ref 17–63)
AST: 32 U/L (ref 15–41)
Anion gap: 9 (ref 5–15)
BILIRUBIN TOTAL: 0.7 mg/dL (ref 0.3–1.2)
BUN: 42 mg/dL — AB (ref 6–20)
CALCIUM: 8.9 mg/dL (ref 8.9–10.3)
CO2: 25 mmol/L (ref 22–32)
CREATININE: 2.28 mg/dL — AB (ref 0.61–1.24)
Chloride: 108 mmol/L (ref 101–111)
GFR calc Af Amer: 29 mL/min — ABNORMAL LOW (ref >60)
GFR, EST NON AFRICAN AMERICAN: 25 mL/min — AB (ref >60)
GLUCOSE: 159 mg/dL — AB (ref 65–99)
Potassium: 4.1 mmol/L (ref 3.5–5.1)
Sodium: 142 mmol/L (ref 135–145)
TOTAL PROTEIN: 5.9 g/dL — AB (ref 6.5–8.1)

## 2016-11-11 LAB — I-STAT CG4 LACTIC ACID, ED: Lactic Acid, Venous: 1.31 mmol/L (ref 0.5–1.9)

## 2016-11-11 LAB — TROPONIN I: Troponin I: 0.05 ng/mL (ref <0.03)

## 2016-11-11 LAB — I-STAT TROPONIN, ED: Troponin i, poc: 0.03 ng/mL (ref 0.00–0.08)

## 2016-11-11 LAB — PROCALCITONIN: Procalcitonin: 6.67 ng/mL

## 2016-11-11 LAB — PROTIME-INR
INR: 1.26
Prothrombin Time: 15.9 seconds — ABNORMAL HIGH (ref 11.4–15.2)

## 2016-11-11 LAB — GLUCOSE, CAPILLARY
GLUCOSE-CAPILLARY: 123 mg/dL — AB (ref 65–99)
Glucose-Capillary: 81 mg/dL (ref 65–99)
Glucose-Capillary: 87 mg/dL (ref 65–99)

## 2016-11-11 LAB — URINALYSIS, ROUTINE W REFLEX MICROSCOPIC
Bilirubin Urine: NEGATIVE
GLUCOSE, UA: 50 mg/dL — AB
Hgb urine dipstick: NEGATIVE
KETONES UR: NEGATIVE mg/dL
LEUKOCYTES UA: NEGATIVE
NITRITE: NEGATIVE
PROTEIN: NEGATIVE mg/dL
Specific Gravity, Urine: 1.019 (ref 1.005–1.030)
pH: 5 (ref 5.0–8.0)

## 2016-11-11 LAB — CREATININE, SERUM
Creatinine, Ser: 2.04 mg/dL — ABNORMAL HIGH (ref 0.61–1.24)
GFR, EST AFRICAN AMERICAN: 33 mL/min — AB (ref >60)
GFR, EST NON AFRICAN AMERICAN: 29 mL/min — AB (ref >60)

## 2016-11-11 LAB — STREP PNEUMONIAE URINARY ANTIGEN: Strep Pneumo Urinary Antigen: NEGATIVE

## 2016-11-11 LAB — APTT: APTT: 35 s (ref 24–36)

## 2016-11-11 LAB — MRSA PCR SCREENING: MRSA by PCR: NEGATIVE

## 2016-11-11 MED ORDER — MOMETASONE FURO-FORMOTEROL FUM 100-5 MCG/ACT IN AERO
2.0000 | INHALATION_SPRAY | Freq: Two times a day (BID) | RESPIRATORY_TRACT | Status: DC
  Administered 2016-11-11 – 2016-11-13 (×5): 2 via RESPIRATORY_TRACT
  Filled 2016-11-11 (×2): qty 8.8

## 2016-11-11 MED ORDER — VANCOMYCIN HCL 10 G IV SOLR
1750.0000 mg | Freq: Once | INTRAVENOUS | Status: DC
  Filled 2016-11-11: qty 1750

## 2016-11-11 MED ORDER — DEXTROSE 5 % IV SOLN: 1.0000 g | INTRAVENOUS | Status: DC

## 2016-11-11 MED ORDER — DEXTROSE-NACL 5-0.9 % IV SOLN
INTRAVENOUS | Status: AC
  Administered 2016-11-11: 20:00:00 via INTRAVENOUS
  Filled 2016-11-11: qty 1000

## 2016-11-11 MED ORDER — DEXTROSE 5 % IV SOLN
2.0000 g | Freq: Once | INTRAVENOUS | Status: AC
  Administered 2016-11-11: 2 g via INTRAVENOUS
  Filled 2016-11-11: qty 2

## 2016-11-11 MED ORDER — SODIUM CHLORIDE 0.9 % IV BOLUS (SEPSIS)
500.0000 mL | Freq: Once | INTRAVENOUS | Status: AC
  Administered 2016-11-11: 500 mL via INTRAVENOUS

## 2016-11-11 MED ORDER — SODIUM CHLORIDE 0.9% FLUSH
3.0000 mL | Freq: Two times a day (BID) | INTRAVENOUS | Status: DC
  Administered 2016-11-11 – 2016-11-13 (×6): 3 mL via INTRAVENOUS

## 2016-11-11 MED ORDER — ONDANSETRON HCL 4 MG/2ML IJ SOLN: 4.0000 mg | Freq: Four times a day (QID) | INTRAMUSCULAR | Status: DC | PRN

## 2016-11-11 MED ORDER — IPRATROPIUM-ALBUTEROL 0.5-2.5 (3) MG/3ML IN SOLN
3.0000 mL | Freq: Once | RESPIRATORY_TRACT | Status: AC
  Administered 2016-11-11: 3 mL via RESPIRATORY_TRACT
  Filled 2016-11-11: qty 3

## 2016-11-11 MED ORDER — MOMETASONE FURO-FORMOTEROL FUM 200-5 MCG/ACT IN AERO: 2.0000 | INHALATION_SPRAY | Freq: Two times a day (BID) | RESPIRATORY_TRACT | Status: DC

## 2016-11-11 MED ORDER — IPRATROPIUM BROMIDE 0.02 % IN SOLN: 0.5000 mg | Freq: Four times a day (QID) | RESPIRATORY_TRACT | Status: DC

## 2016-11-11 MED ORDER — INSULIN ASPART 100 UNIT/ML ~~LOC~~ SOLN
0.0000 [IU] | Freq: Three times a day (TID) | SUBCUTANEOUS | Status: DC
  Administered 2016-11-11: 1 [IU] via SUBCUTANEOUS
  Administered 2016-11-12 – 2016-11-13 (×4): 2 [IU] via SUBCUTANEOUS
  Administered 2016-11-13: 1 [IU] via SUBCUTANEOUS
  Administered 2016-11-14: 2 [IU] via SUBCUTANEOUS
  Administered 2016-11-14: 1 [IU] via SUBCUTANEOUS

## 2016-11-11 MED ORDER — TIOTROPIUM BROMIDE MONOHYDRATE 18 MCG IN CAPS
18.0000 ug | ORAL_CAPSULE | Freq: Every day | RESPIRATORY_TRACT | Status: DC
  Administered 2016-11-11 – 2016-11-13 (×3): 18 ug via RESPIRATORY_TRACT
  Filled 2016-11-11 (×2): qty 5

## 2016-11-11 MED ORDER — VANCOMYCIN HCL IN DEXTROSE 1-5 GM/200ML-% IV SOLN
1000.0000 mg | Freq: Once | INTRAVENOUS | Status: DC
  Filled 2016-11-11: qty 200

## 2016-11-11 MED ORDER — PIPERACILLIN-TAZOBACTAM 3.375 G IVPB
3.3750 g | Freq: Three times a day (TID) | INTRAVENOUS | Status: DC
  Administered 2016-11-11 – 2016-11-13 (×5): 3.375 g via INTRAVENOUS
  Filled 2016-11-11 (×6): qty 50

## 2016-11-11 MED ORDER — ONDANSETRON HCL 4 MG PO TABS: 4.0000 mg | ORAL_TABLET | Freq: Four times a day (QID) | ORAL | Status: DC | PRN

## 2016-11-11 MED ORDER — ENOXAPARIN SODIUM 30 MG/0.3ML ~~LOC~~ SOLN
30.0000 mg | SUBCUTANEOUS | Status: DC
  Administered 2016-11-11 – 2016-11-13 (×3): 30 mg via SUBCUTANEOUS
  Filled 2016-11-11 (×3): qty 0.3

## 2016-11-11 MED ORDER — VANCOMYCIN HCL IN DEXTROSE 1-5 GM/200ML-% IV SOLN: 1000.0000 mg | INTRAVENOUS | Status: DC

## 2016-11-11 MED ORDER — VANCOMYCIN HCL IN DEXTROSE 1-5 GM/200ML-% IV SOLN
1000.0000 mg | INTRAVENOUS | Status: DC
  Administered 2016-11-11 – 2016-11-13 (×3): 1000 mg via INTRAVENOUS
  Filled 2016-11-11 (×2): qty 200

## 2016-11-11 MED ORDER — ORAL CARE MOUTH RINSE
15.0000 mL | Freq: Two times a day (BID) | OROMUCOSAL | Status: DC
  Administered 2016-11-11 – 2016-11-14 (×5): 15 mL via OROMUCOSAL

## 2016-11-11 MED ORDER — ALBUTEROL SULFATE (2.5 MG/3ML) 0.083% IN NEBU: 2.5000 mg | INHALATION_SOLUTION | Freq: Four times a day (QID) | RESPIRATORY_TRACT | Status: DC | PRN

## 2016-11-11 NOTE — Progress Notes (Signed)
Family Medicine Teaching Service Daily Progress Note Intern Pager: [phone]  Patient name: Antonio Ray Medical record number: [id number] Date of birth: 1933/11/09 Age: 83 y.o. Gender: male  Primary Care Provider: [name], MD Consultants: CCM Code Status: Full  Pt Overview and Major Events to Date:  4/13 Admitted for pneumonia  Assessment and Plan: Antonio Ray is a 83 y.o. male presenting with RLL pneumonia. PMH is significant for HFrEF (EF 20-25%) s/p St. Jude ICD placement in 2012, COPD GOLD IV, T2DM, CAD s/p MI with PCI to LAD in 2006, CKD III, sick sinus syndrome, HLD, BPH, dysphagia/achalasia s/p botox injections in 2016, hx aspiration pneumonia, mild cognitive impairment.  Septic Shock 2/2 RLL pneumonia, improving: CAP vs aspiration pneumonia given his recent worsening in swallowing function and SLP evaluation of continued dysphagia. Currently requiring 2L O2 by [place]. Procalcitonin 6.67 - CCM recommended dulera, albuterol prn, home spiriva. Available as needed. - IV Vanc and Zosyn (4/13-) for coverage of aspiration pneumonia.  - Blood cultures pending - Urinary strep pneumonia neg and legionella pending - Troponins 0.05 > 0.05 >0.07, likely d/t demand will continue trending. - am EKG pending - Albuterol neb q6hrs prn - Incentive spirometry  - Cardiac monitoring - Continue supplemental O2, will wean as able - PT/OT  Achalasia/Dysphagia: Follows with [facility]. s/p balloon dilatation in 2017 and botox injections in 2016. Has an upcoming appointment with GI scheduled on 4/16. - Discussed with GI, patient needs outpatient follow-up for endoscopy. Nothing to do while hospitalized - Holding home Protonix 20mg  daily while NPO - SLP consult: patient is continued aspiration risk but discussed with patient and family to proceed with po diet. Dysphagia 3 diet with thin liquids.   CAD: s/p MI with PCI to LAD in 2006, also with remote MI in 1994. Most recent cath was in 2007  and showed akinesia of the distal anterior wall, distal inferior wall, and akinesia of the apex. No chest pain this morning. - Hold home Nitroglycerin patch 0.2 mg daily while hypotensive - restart home Aspirin 81mg  - am EKG pending - Trending troponins as per above, will consider cardiology consult if continues to trend upwards.  HFrEF: Follows with Dr. Martinique (Cardiology). St. Jude ICD placed in 2012. Most recent ECHO in 04/2016 with EF 20-25%. Not on a beta blocker or ACEI due to CKD and inability of BP to tolerate. Appears euvolemic on exam. - Holding home Lasix 40mg  PO daily for now while hypotensive. Will watch respiratory status closely and add back Lasix as appropriate.  CKD III, improving: Cr 1.93. Baseline 1.7-2.1. - Avoid nephrotoxic agents - Monitor BMET  COPD: GOLD IV. Follows with Dr. Lake Bells. No wheezing appreciated on exam. - Continue home Spiriva - On Symbicort at home, switch to [facility] (on formulary) while hospitalized - Albuterol neb q6hrs prn - CCM to set up follow-up appointment with Dr. Lake Bells.  T2DM: A1c 7.9 At home takes Lantus 30 units in the AM and Novolog 6 units with breakfast, 5 units with lunch, and 5 units with dinner. - CBGs every 4 hours - Restart Lantus at 15U  - Sensitive SSI   HLD: Last lipid panel in 2014 with Chol 136, HDL 32, LDL 76, TG 142 - Restart home Pravastatin 40mg  daily and Fenofibrate 145mg h daily while NPO  Mild Cognitive Impairment: Stable - Restart home Memantine 10mg  bid and Aricept 10mg  daily   Seasonal Allergies: Stable - Restart home Claritin   FEN/GI: Dysphagia 3 (mechanical soft) diet. protonix Prophylaxis:  Lovenox   Disposition: pending medical management  Subjective:  States feels well this morning, breathing is back to baseline. Has no concerns. No CP, SOB, n/v. Does endorse some R calf cramping occasionally that is not present this morning.  Objective: Temp:  [97.8 F (36.6 C)-101.4 F (38.6 C)] 98.2 F  (36.8 C) (04/14 0409) Pulse Rate:  [59-100] 63 (04/14 0409) Resp:  [15-27] 18 (04/14 0409) BP: (77-114)/(44-89) 110/53 (04/14 0409) SpO2:  [90 %-100 %] 98 % (04/14 0409) Weight:  [89.4 kg (197 lb 1.6 oz)-91.6 kg (202 lb)] 89.4 kg (197 lb 1.6 oz) (04/13 1543) Physical Exam: General: Well-appearing, in NAD Cardiovascular: RRR, no murmurs Respiratory: Normal work of breathing with nasal cannula in place, able to speak in full sentences, mildly decreased air movement throughout all lung fields, crackles present in the right lung base, no wheezing. Abdomen: +BS, soft, non-tender, non-distended Extremities: no LE edema. Neg Hom[place]. Neuro: Awake, alert, oriented x 3, CN 2-12 intact, no focal deficits, moves all extremities Psych: Normal behavior, appropriate affect  Laboratory:  Recent Labs Lab 11/11/16 0824 11/11/16 1525 11/12/16 0657  WBC 17.2* 18.3* 15.5*  HGB 13.5 12.6* 12.8*  HCT 42.9 40.4 40.8  PLT 195 179 181    Recent Labs Lab 11/11/16 0824 11/11/16 1525 11/12/16 0657  NA 142  --  141  K 4.1  --  4.5  CL 108  --  107  CO2 25  --  27  BUN 42*  --  32*  CREATININE 2.28* 2.04* 1.93*  CALCIUM 8.9  --  8.6*  PROT 5.9*  --   --   BILITOT 0.7  --   --   ALKPHOS 35*  --   --   ALT 24  --   --   AST 32  --   --   GLUCOSE 159*  --  107*    Troponins 0.05 > 0.05 > 0.07  Imaging/Diagnostic Tests: No results found.   Bufford Lope, DO 11/12/2016, 11:10 AM PGY-1, [place] Intern pager: [phone], text pages welcome

## 2016-11-11 NOTE — ED Notes (Signed)
Critical Care at bedside.  

## 2016-11-11 NOTE — ED Provider Notes (Signed)
[facility] DEPT Provider Note   CSN: [id number] Arrival date & time: 11/11/16  0818     History   Chief Complaint Chief Complaint  Patient presents with  . Altered Mental Status    HPI Antonio Ray is a 83 y.o. male.  The history is provided by the patient and the EMS personnel. No language interpreter was used.  Altered Mental Status     Antonio Ray is a 83 y.o. male who presents to the Emergency Department complaining of AMS.  He presents via EMS for change in mental status. He was in his routine state of health yesterday when he returned from a trip to [place] with family. This morning when his daughter checked on him he appeared to be confused. He reports shortness of breath and generalized weakness home earlier today as well as chills. He states that overall he feels improved and is asymptomatic at this time. No reports of fevers, chest pain, abdominal pain, nausea, vomiting, leg swelling or pain. He lives at home alone. EMS reports fever to 102 and he received 1 g of Tylenol prior to ED arrival. Past Medical History:  Diagnosis Date  . Achalasia   . AICD (automatic cardioverter/defibrillator) present 03/30/2011   Analyze ST study patient  . Anginal pain ([place])   . BPH (benign prostatic hyperplasia)   . CHF (congestive heart failure) ([place])   . Chronic systolic dysfunction of left ventricle    EF 30-35%, CLASS II - III SYMPTOMS; intolerant to Coreg  . CKD (chronic kidney disease) stage 3, GFR 30-59 ml/min 06/08/2012  . COPD (chronic obstructive pulmonary disease) ([place])   . Coronary artery disease    History of remote anterior MI with PCI to LAD in 2006; most recent cath 2007, no intervention required  . DOE (dyspnea on exertion)    with heavy exertion  . Dysrhythmia   . Esophageal dysmotility   . GERD (gastroesophageal reflux disease)   . Head injury, closed, with concussion 2000ish  . Heart murmur    hx  . HOH (hard of hearing)   . Hyperlipidemia   . Memory  loss    improved  . Myocardial infarction 1994   ANTERIOR  . Pneumonia "several times"   aspiration pna with at least 3 admits for this 2016.   Marland Kitchen PVC's (premature ventricular contractions)   . Renal failure   . Skin cancer "several"   "forearms; head"  . Type II diabetes mellitus [facility])     Patient Active Problem List   Diagnosis Date Noted  . CAP (community acquired pneumonia) 09/22/2016  . Fever chills   . SOB (shortness of breath)   . Acute respiratory failure with hypoxemia ([place])   . Acute respiratory failure ([place]) 04/21/2016  . Chronic combined systolic and diastolic congestive heart failure ([place]) 04/21/2016  . Memory loss 04/21/2016  . Allergic rhinitis 04/11/2016  . Chronic systolic CHF (congestive heart failure) ([place]) 10/30/2015  . Mild cognitive impairment 08/18/2015  . Difficulty in swallowing   . Pneumonia 05/12/2015  . AKI (acute kidney injury) ([place])   . Poorly controlled type 2 diabetes mellitus with circulatory disorder ([place])   . Chronic systolic congestive heart failure ([place])   . Chronic kidney disease, stage III (moderate)   . Other specified hypotension   . CKD (chronic kidney disease), stage III   . Sepsis due to pneumonia ([place])   . Aspiration pneumonia due to food (regurgitated) ([place])   . Pneumonitis   . Other emphysema (  [place])   . Systolic CHF, acute on chronic ([place])   . AICD (automatic cardioverter/defibrillator) present   . CAD S/P percutaneous coronary angioplasty   . Acute respiratory failure with hypoxia ([place]) 04/03/2015  . Cardiomyopathy, ischemic 04/03/2015  . Elevated troponin 04/03/2015  . Sepsis ([place]) 03/31/2015  . Blood poisoning ([place])   . COPD exacerbation ([place])   . HCAP (healthcare-associated pneumonia) and recurrent Aspiration Pneumonia 03/21/2015  . Aspiration into airway 03/21/2015  . Achalasia 03/21/2015  . Dysphagia   . Aspiration pneumonia ([place])   . Esophageal dysphagia   . Esophageal dysmotilities   . PNA (pneumonia) 02/07/2015   . COPD with acute exacerbation ([place]) 01/14/2015  . Left ventricular apical thrombus 01/14/2015  . Acute on chronic systolic CHF (congestive heart failure) ([place]) 01/14/2015  . ARF (acute renal failure) ([place]) 01/14/2015  . Dysphagia, unspecified(787.20) 10/30/2013  . COPD GOLD IV 10/07/2013  . Influenza with pneumonia 09/11/2013  . Leukocytosis 09/11/2013  . Pneumonia, community acquired 09/10/2013  . Acute encephalopathy 09/10/2013  . Renal failure (ARF), acute on chronic ([name]) 09/10/2013  . Automatic implantable cardioverter-defibrillator in situ 07/06/2012  . CKD (chronic kidney disease) stage 3, GFR 30-59 ml/min 06/08/2012  . GERD (gastroesophageal reflux disease) 09/26/2011  . BPH (benign prostatic hyperplasia) 08/22/2011  . COPD (chronic obstructive pulmonary disease) ([place]) 07/17/2011  . S/P ICD (internal cardiac defibrillator) procedure 04/20/2011  . Coronary artery disease   . Sick sinus syndrome ([place])   . Acute on chronic combined systolic and diastolic congestive heart failure, NYHA class 3 ([place])   . Hyperlipidemia   . PVC's (premature ventricular contractions)     Past Surgical History:  Procedure Laterality Date  . BALLOON DILATION N/A 09/09/2015   Procedure: BALLOON DILATION;  Surgeon: Mauri Pole, MD;  Location: [facility];  Service: Endoscopy;  Laterality: N/A;  rigiflex achalasia balloon dilators  . BOTOX INJECTION N/A 04/08/2015   Procedure: BOTOX INJECTION;  Surgeon: Milus Banister, MD;  Location: [facility];  Service: Endoscopy;  Laterality: N/A;  . CARDIAC CATHETERIZATION  01/11/2006   DEMONSTRATES AKINESIA OF THE DISTAL ANTERIOR WALL, DISTAL INFERIOR WALL AND AKINESIA OF THE APEX. THE BASAL SEGMENTS CONTRACT WELL AND OVERALL EF 35%  . CATARACT EXTRACTION W/ INTRAOCULAR LENS  IMPLANT, BILATERAL  08/2014-09/2014  . CHOLECYSTECTOMY  2/11  . CORONARY ANGIOPLASTY  1994   TO THE LAD  . ESOPHAGEAL MANOMETRY N/A 03/16/2015   Procedure: ESOPHAGEAL MANOMETRY (EM);   Surgeon: Jerene Bears, MD;  Location: [facility];  Service: Gastroenterology;  Laterality: N/A;  . ESOPHAGOGASTRODUODENOSCOPY N/A 04/08/2015   Procedure: ESOPHAGOGASTRODUODENOSCOPY (EGD);  Surgeon: Milus Banister, MD;  Location: [facility];  Service: Endoscopy;  Laterality: N/A;  . ESOPHAGOGASTRODUODENOSCOPY (EGD) WITH PROPOFOL N/A 09/09/2015   Procedure: ESOPHAGOGASTRODUODENOSCOPY (EGD) WITH PROPOFOL;  Surgeon: Mauri Pole, MD;  Location: [facility];  Service: Endoscopy;  Laterality: N/A;  pt. is to have gastrografin xray post recovery-do not discharge until results are back  . FOOT FRACTURE SURGERY Right 1980's  . INSERT / REPLACE / REMOVE PACEMAKER    . SKIN CANCER EXCISION  "several"   "forearms, head"       Home Medications    Prior to Admission medications   Medication Sig Start Date End Date Taking? Authorizing Provider  albuterol (PROVENTIL HFA;VENTOLIN HFA) 108 (90 Base) MCG/ACT inhaler Inhale 2 puffs into the lungs every 6 (six) hours as needed for wheezing or shortness of breath. 04/22/16   Eugenie Filler, MD  aspirin 81  MG EC tablet Take 81 mg by mouth daily.      Historical Provider, MD  Cholecalciferol (VITAMIN D PO) Take 2,000 Units by mouth daily.     Historical Provider, MD  donepezil (ARICEPT) 10 MG tablet Take 1 tablet (10 mg total) by mouth every morning. 08/18/15   Marcial Pacas, MD  fenofibrate (TRICOR) 145 MG tablet TAKE 1 TABLET BY MOUTH EVERY DAY 11/24/15   Peter M Martinique, MD  furosemide (LASIX) 20 MG tablet Take 2 tablets (40 mg total) by mouth daily. 10/30/15   Peter M Martinique, MD  glucose blood (FREESTYLE TEST STRIPS) test strip Use as instructed to check sugar 7 times daily 08/08/16   Philemon Kingdom, MD  insulin aspart (NOVOLOG FLEXPEN) 100 UNIT/ML FlexPen Inject 5-6 Units into the skin See admin instructions. Inject 6 units SQ with breakfast, 5 units SQ with lunch and 5 units SQ with dinner 09/22/16   Doreatha Lew, MD  Insulin Glargine (BASAGLAR  KWIKPEN) 100 UNIT/ML SOPN Inject 0.3 mLs (30 Units total) into the skin every morning. 10/11/16   Philemon Kingdom, MD  Insulin Pen Needle (CAREFINE PEN NEEDLES) 32G X 5 MM MISC Use 4x a day 07/19/16   Philemon Kingdom, MD  loratadine (CLARITIN) 10 MG tablet Take 1 tablet (10 mg total) by mouth daily. 07/02/15   Barton Dubois, MD  memantine (NAMENDA) 10 MG tablet Take one tablet twice daily. Please call [phone] for an appointment. 08/22/16   Marcial Pacas, MD  Multiple Vitamins-Minerals (PRESERVISION AREDS 2) CAPS Take 1 capsule by mouth 2 (two) times daily.    Historical Provider, MD  nitroGLYCERIN (NITRODUR - DOSED IN MG/24 HR) 0.2 mg/hr patch Place 1 patch (0.2 mg total) onto the skin daily. 09/15/15   Peter M Martinique, MD  pantoprazole (PROTONIX) 20 MG tablet Take 1 tablet (20 mg total) by mouth daily. 08/26/15   Mauri Pole, MD  pravastatin (PRAVACHOL) 40 MG tablet Take 1 tablet (40 mg total) by mouth daily. 11/01/16   Peter M Martinique, MD  SPIRIVA HANDIHALER 18 MCG inhalation capsule INHALE CONTENTS OF 1 CAPSULE VIA HANDIHALER ONCE DAILY 09/05/16   Juanito Doom, MD  SYMBICORT 160-4.5 MCG/ACT inhaler INHALE 2 PUFFS BY MOUTH TWICE DAILY 05/30/16   Juanito Doom, MD    Family History Family History  Problem Relation Age of Onset  . Stroke Father     Mother history unknown - never met her  . Colon cancer Neg Hx     Social History Social History  Substance Use Topics  . Smoking status: Former Smoker    Packs/day: 1.00    Years: 35.00    Types: Cigarettes    Quit date: 08/01/1992  . Smokeless tobacco: Never Used  . Alcohol use No     Allergies   Codeine; Dilaudid [hydromorphone hcl]; Flomax [tamsulosin hcl]; Morphine and related; Sulfa antibiotics; Beta adrenergic blockers; and Carvedilol   Review of Systems Review of Systems  All other systems reviewed and are negative.    Physical Exam Updated Vital Signs BP (!) 88/53   Pulse 84   Temp (!) 101.4 F (38.6 C) (Rectal)    Resp (!) 24   Ht 5' 5"  (1.651 m)   Wt 202 lb (91.6 kg)   SpO2 98%   BMI 33.61 kg/m   Physical Exam  Constitutional: He is oriented to person, place, and time. He appears well-developed and well-nourished.  HENT:  Head: Normocephalic and atraumatic.  Eyes: EOM are normal. Pupils  are equal, round, and reactive to light.  Cardiovascular: Regular rhythm.   No murmur heard. Tachycardic  Pulmonary/Chest: Effort normal. No respiratory distress.  Decreased air movement bilaterally with occasional crackles in the right lung base  Abdominal: Soft. There is no tenderness. There is no rebound and no guarding.  Musculoskeletal: He exhibits no edema or tenderness.  Neurological: He is alert and oriented to person, place, and time.  No facial asymmetry. 5 out of 5 strength in all 4 extremities. Sensation to light touch intact in all 4 extremities  Skin: Skin is warm and dry.  Psychiatric: He has a normal mood and affect. His behavior is normal.  Nursing note and vitals reviewed.    ED Treatments / Results  Labs (all labs ordered are listed, but only abnormal results are displayed) Labs Reviewed  COMPREHENSIVE METABOLIC PANEL - Abnormal; Notable for the following:       Result Value   Glucose, Bld 159 (*)    BUN 42 (*)    Creatinine, Ser 2.28 (*)    Total Protein 5.9 (*)    Albumin 3.3 (*)    Alkaline Phosphatase 35 (*)    GFR calc non Af Amer 25 (*)    GFR calc Af Amer 29 (*)    All other components within normal limits  CBC WITH DIFFERENTIAL/PLATELET - Abnormal; Notable for the following:    WBC 17.2 (*)    Neutro Abs 15.2 (*)    Monocytes Absolute 1.2 (*)    All other components within normal limits  URINALYSIS, ROUTINE W REFLEX MICROSCOPIC - Abnormal; Notable for the following:    Glucose, UA 50 (*)    All other components within normal limits  CULTURE, BLOOD (ROUTINE X 2)  CULTURE, BLOOD (ROUTINE X 2)  URINE CULTURE  I-STAT CG4 LACTIC ACID, ED  I-STAT TROPOININ, ED     EKG  EKG Interpretation  Date/Time:  Friday November 11 2016 08:26:18 EDT Ventricular Rate:  99 PR Interval:    QRS Duration: 152 QT Interval:  359 QTC Calculation: 461 R Axis:   -80 Text Interpretation:  Sinus rhythm Consider left atrial enlargement Nonspecific IVCD with LAD LVH with secondary repolarization abnormality Anterior infarct, old Confirmed by Hazle Coca [phone]) on 11/11/2016 8:30:14 AM       Radiology Dg Chest Port 1 View  Result Date: 11/11/2016 CLINICAL DATA:  Fever and altered mental status today. EXAM: PORTABLE CHEST 1 VIEW COMPARISON:  PA and lateral chest 09/22/2016 and 04/22/2016. FINDINGS: Right basilar airspace disease identified. The left lung is clear. The chest is hyperexpanded. Heart size is normal. No pneumothorax or pleural fluid. Aortic atherosclerosis is identified. Pacing device is noted. IMPRESSION: Right lower lobe airspace disease most consistent with pneumonia. Recommend followup to clearing. Emphysema. Atherosclerosis. Electronically Signed   By: Inge Rise M.D.   On: 11/11/2016 09:25    Procedures Procedures (including critical care time)  Medications Ordered in ED Medications  ceFEPIme (MAXIPIME) 2 g in dextrose 5 % 50 mL IVPB (2 g Intravenous New Bag/Given 11/11/16 0934)  ceFEPIme (MAXIPIME) 1 g in dextrose 5 % 50 mL IVPB (not administered)  vancomycin (VANCOCIN) IVPB 1000 mg/200 mL premix (1,000 mg Intravenous New Bag/Given 11/11/16 0935)  ipratropium-albuterol (DUONEB) 0.5-2.5 (3) MG/3ML nebulizer solution 3 mL (3 mLs Nebulization Given 11/11/16 0900)  sodium chloride 0.9 % bolus 500 mL (500 mLs Intravenous New Bag/Given 11/11/16 0934)     Initial Impression / Assessment and Plan / ED Course  I have  reviewed the triage vital signs and the nursing notes.  Pertinent labs & imaging results that were available during my care of the patient were reviewed by me and considered in my medical decision making (see chart for details).      Patient with history of COPD, CK D, CHF here for evaluation of shortness of breath and altered mental status beginning this morning. He was noted to be febrile for EMS and in the emergency department. His CBC was significant leukocytosis. Chest x-ray demonstrates a right lower lobe infiltrate. Patient has a new oxygen requirement in the emergency department with no respiratory distress. Oxygen saturations are 88% on room air, 92-93% on 2 L. Treated with broad-spectrum antibiotics given recent hospitalization. Discussed with patient findings of studies and recommendation for admission for further treatment. Discussed with pulmonary per family request - patient is followed by Dr. Lake Bells. Medicine consultation for admission for further treatment.  Sepsis - Repeat Assessment  Performed at:    1000  Vitals     Blood pressure (!) 103/54, pulse 81, temperature (!) 101.4 F (38.6 C), temperature source Rectal, resp. rate (!) 24, height 5' 5"  (1.651 m), weight 202 lb (91.6 kg), SpO2 97 %.  Heart:     Regular rate and rhythm  Lungs:    No respiratory distress, crackles in right lung base  Capillary Refill:   <2 sec  Peripheral Pulse:   Radial pulse palpable  Skin:     Normal Color  Patient with community acquired pneumonia he had transient hypotension in the emergency department that improved prior to IV fluid administration. His lactate is normal. We'll begin gentle IV fluid hydration with 500 mL bolus given his transient hypotension. Will defer 30 mL/kg bolus at this time as patient does have significant CHF and CK D, concern for volume overload if he is over aggressively hydrated.   Final Clinical Impressions(s) / ED Diagnoses   Final diagnoses:  HCAP (healthcare-associated pneumonia)    New Prescriptions New Prescriptions   No medications on file     Quintella Reichert, MD 11/12/16 [phone]

## 2016-11-11 NOTE — ED Notes (Signed)
Paged admitting in regards to lower trend in BP. Pt is alert and ox4. Resting at this time. Warm and dry

## 2016-11-11 NOTE — Telephone Encounter (Signed)
Called and spoke with pts son and he stated that he was advised to call to make BQ aware that his dad was in the hospital.  He stated that he is in A6--he stated that they are treating him for PNA>  FYI sent to BQ.

## 2016-11-11 NOTE — ED Notes (Signed)
Pt had large brown BM. Pt was helped onto bed side commode.

## 2016-11-11 NOTE — ED Notes (Signed)
MD Mcquaid at bedside

## 2016-11-11 NOTE — Telephone Encounter (Signed)
I'm seeing him in hospital

## 2016-11-11 NOTE — Progress Notes (Addendum)
Pharmacy Antibiotic Note  Antonio Ray is a 83 y.o. male admitted on 11/11/2016 with pneumonia.  Pharmacy has been consulted for vancomycin and cefepime dosing. Tmax is 101.4 and WBC is elevated at 17.2. SCr is elevated at 2.28 but known history of CKD. Lactic acid is <2.   Plan: Vancomycin 1gm IV Q24H Cefepime 2gm IV x 1 then 1gm IV Q24H F/u renal fxn, C&S, clinical status and trough at SS  Height: 5\' 5"  (165.1 cm) Weight: 202 lb (91.6 kg) IBW/kg (Calculated) : 61.5  Temp (24hrs), Avg:101.4 F (38.6 C), Min:101.4 F (38.6 C), Max:101.4 F (38.6 C)   Recent Labs Lab 11/11/16 0824 11/11/16 0841  WBC 17.2*  --   CREATININE 2.28*  --   LATICACIDVEN  --  1.31    Estimated Creatinine Clearance: 26.4 mL/min (A) (by C-G formula based on SCr of 2.28 mg/dL (H)).    Allergies  Allergen Reactions  . Codeine Other (See Comments)    Gets very angry, disoriented  . Dilaudid [Hydromorphone Hcl] Other (See Comments)    VERY AGITATED, HOSTILE  . Flomax [Tamsulosin Hcl] Shortness Of Breath  . Morphine And Related Other (See Comments)    VERY AGITATED, HOSTILE  . Sulfa Antibiotics Shortness Of Breath  . Beta Adrenergic Blockers Other (See Comments)    ? disorientation  . Carvedilol Other (See Comments)    DISORIENTATION    Antimicrobials this admission: Vanc 4/13>> Cefepime 4/13>>  Dose adjustments this admission: N/A  Microbiology results: Pending  Thank you for allowing pharmacy to be a part of this patient's care.  Analiese Krupka, Rande Lawman 11/11/2016 9:32 AM   Addendum:  Changing cefepime to zosyn.   Salome Arnt, PharmD, BCPS Pager # [phone] 11/11/2016 12:13 PM

## 2016-11-11 NOTE — Consult Note (Signed)
Name: Antonio Ray MRN: [id number] DOB: 1933-08-22    ADMISSION DATE:  11/11/2016 CONSULTATION DATE:  11/11/16  REFERRING MD :  Dr. Ralene Bathe   CHIEF COMPLAINT:  PNA   BRIEF PATIENT DESCRIPTION: 83 y/o M with COPD followed by Dr. Lake Bells admitted 4/13 with RLL infiltrate & fever to 102 concerning for PNA.   HISTORY OF PRESENT ILLNESS:  83 y/o M with COPD (FEV1 29% predicted) followed by Dr. Lake Bells admitted 4/13 with RLL infiltrate & fever to 102.   The patient was last seen 2/23 per Dr. Lake Bells for fever after admit 2/21-2/22 for possible PNA.  He returns 4/13 after being found by his daughter this am confused.  He was found to have fever to 102 per EMS. He was treated with tylenol in the ER.  The patients son is with him and reports they visited [place], [place] on 4/12 with no acute issues. The patient woke at 0500 with rigors / chills.  Family reports they have noted a slight increase in the amount of coughing the patient has with eating.  He denies known episodes of choking/aspiration. They have an appointment set up for Monday 4/16 with GI.    Initial labs - Na 142, K 4.1, Cl 108, glucose 159, BUN 42 / Sr Cr 2.28 (baseline ~1.75), albumin 3.3, AST 32 / ALT 24, troponin 0.03, lactic acid 1.31, WBC 17.2, Hgb 13.5 and platelets 195.  UA negative for infection but had 50 glucose.  CXR showed a RLL airspace disease concerning for PNA superimposed on emphysema.   PCCM consulted for evaluation of PNA.   PAST MEDICAL HISTORY :   has a past medical history of Achalasia; AICD (automatic cardioverter/defibrillator) present (03/30/2011); Anginal pain ([name]); BPH (benign prostatic hyperplasia); CHF (congestive heart failure) ([place]); Chronic systolic dysfunction of left ventricle; CKD (chronic kidney disease) stage 3, GFR 30-59 ml/min (06/08/2012); COPD (chronic obstructive pulmonary disease) ([place]); Coronary artery disease; DOE (dyspnea on exertion); Dysrhythmia; Esophageal dysmotility; GERD (gastroesophageal  reflux disease); Head injury, closed, with concussion (2000ish); Heart murmur; HOH (hard of hearing); Hyperlipidemia; Memory loss; Myocardial infarction (1994); Pneumonia ("several times"); PVC's (premature ventricular contractions); Renal failure; Skin cancer ("several"); and Type II diabetes mellitus ([place]).  has a past surgical history that includes Foot fracture surgery (Right, 1980's); Cholecystectomy (2/11); Cataract extraction w/ intraocular lens  implant, bilateral (08/2014-09/2014); Cardiac catheterization (01/11/2006); Coronary angioplasty (1994); Skin cancer excision ("several"); Esophageal manometry (N/A, 03/16/2015); Insert / replace / remove pacemaker; Esophagogastroduodenoscopy (N/A, 04/08/2015); Botox injection (N/A, 04/08/2015); Esophagogastroduodenoscopy (egd) with propofol (N/A, 09/09/2015); and Balloon dilation (N/A, 09/09/2015).   Prior to Admission medications   Medication Sig Start Date End Date Taking? Authorizing Provider  albuterol (PROVENTIL HFA;VENTOLIN HFA) 108 (90 Base) MCG/ACT inhaler Inhale 2 puffs into the lungs every 6 (six) hours as needed for wheezing or shortness of breath. 04/22/16   Eugenie Filler, MD  aspirin 81 MG EC tablet Take 81 mg by mouth daily.      Historical Provider, MD  Cholecalciferol (VITAMIN D PO) Take 2,000 Units by mouth daily.     Historical Provider, MD  donepezil (ARICEPT) 10 MG tablet Take 1 tablet (10 mg total) by mouth every morning. 08/18/15   Marcial Pacas, MD  fenofibrate (TRICOR) 145 MG tablet TAKE 1 TABLET BY MOUTH EVERY DAY 11/24/15   Peter M Martinique, MD  furosemide (LASIX) 20 MG tablet Take 2 tablets (40 mg total) by mouth daily. 10/30/15   Peter M Martinique, MD  glucose blood (FREESTYLE TEST  STRIPS) test strip Use as instructed to check sugar 7 times daily 08/08/16   Philemon Kingdom, MD  insulin aspart (NOVOLOG FLEXPEN) 100 UNIT/ML FlexPen Inject 5-6 Units into the skin See admin instructions. Inject 6 units SQ with breakfast, 5 units SQ with lunch and 5  units SQ with dinner 09/22/16   Doreatha Lew, MD  Insulin Glargine (BASAGLAR KWIKPEN) 100 UNIT/ML SOPN Inject 0.3 mLs (30 Units total) into the skin every morning. 10/11/16   Philemon Kingdom, MD  Insulin Pen Needle (CAREFINE PEN NEEDLES) 32G X 5 MM MISC Use 4x a day 07/19/16   Philemon Kingdom, MD  loratadine (CLARITIN) 10 MG tablet Take 1 tablet (10 mg total) by mouth daily. 07/02/15   Barton Dubois, MD  memantine (NAMENDA) 10 MG tablet Take one tablet twice daily. Please call [phone] for an appointment. 08/22/16   Marcial Pacas, MD  Multiple Vitamins-Minerals (PRESERVISION AREDS 2) CAPS Take 1 capsule by mouth 2 (two) times daily.    Historical Provider, MD  nitroGLYCERIN (NITRODUR - DOSED IN MG/24 HR) 0.2 mg/hr patch Place 1 patch (0.2 mg total) onto the skin daily. 09/15/15   Peter M Martinique, MD  pantoprazole (PROTONIX) 20 MG tablet Take 1 tablet (20 mg total) by mouth daily. 08/26/15   Mauri Pole, MD  pravastatin (PRAVACHOL) 40 MG tablet Take 1 tablet (40 mg total) by mouth daily. 11/01/16   Peter M Martinique, MD  SPIRIVA HANDIHALER 18 MCG inhalation capsule INHALE CONTENTS OF 1 CAPSULE VIA HANDIHALER ONCE DAILY 09/05/16   Juanito Doom, MD  SYMBICORT 160-4.5 MCG/ACT inhaler INHALE 2 PUFFS BY MOUTH TWICE DAILY 05/30/16   Juanito Doom, MD   Allergies  Allergen Reactions  . Codeine Other (See Comments)    Gets very angry, disoriented  . Dilaudid [Hydromorphone Hcl] Other (See Comments)    VERY AGITATED, HOSTILE  . Flomax [Tamsulosin Hcl] Shortness Of Breath  . Morphine And Related Other (See Comments)    VERY AGITATED, HOSTILE  . Sulfa Antibiotics Shortness Of Breath  . Beta Adrenergic Blockers Other (See Comments)    ? disorientation  . Carvedilol Other (See Comments)    DISORIENTATION    FAMILY HISTORY:  family history includes Stroke in his father.   SOCIAL HISTORY:  reports that he quit smoking about 24 years ago. His smoking use included Cigarettes. He has a 35.00  pack-year smoking history. He has never used smokeless tobacco. He reports that he does not drink alcohol or use drugs.  REVIEW OF SYSTEMS:  POSITIVES IN BOLD Constitutional: Negative for fever, chills, weight loss, malaise/fatigue and diaphoresis.  HENT: Negative for hearing loss, ear pain, nosebleeds, congestion, sore throat, neck pain, tinnitus and ear discharge.   Eyes: Negative for blurred vision, double vision, photophobia, pain, discharge and redness.  Respiratory: Negative for cough, hemoptysis, sputum production, shortness of breath, wheezing and stridor.   Cardiovascular: Negative for chest pain, palpitations, orthopnea, claudication, leg swelling and PND.  Gastrointestinal: Negative for heartburn, nausea, vomiting, abdominal pain, diarrhea, constipation, blood in stool and melena. Occasional coughing up thick clear secretions with eating.   Genitourinary: Negative for dysuria, urgency, frequency, hematuria and flank pain.  Musculoskeletal: Negative for myalgias, back pain, joint pain and falls.  Skin: Negative for itching and rash.  Neurological: Negative for dizziness, tingling, tremors, sensory change, speech change, focal weakness, seizures, loss of consciousness, weakness and headaches.  Endo/Heme/Allergies: Negative for environmental allergies and polydipsia. Does not bruise/bleed easily.  SUBJECTIVE:   VITAL SIGNS: Temp:  [  101.4 F (38.6 C)] 101.4 F (38.6 C) (04/13 0832) Pulse Rate:  [84-100] 84 (04/13 0934) Resp:  [23-27] 24 (04/13 0934) BP: (85-111)/(52-89) 88/53 (04/13 0934) SpO2:  [90 %-98 %] 98 % (04/13 0934) Weight:  [202 lb (91.6 kg)] 202 lb (91.6 kg) (04/13 2035)  PHYSICAL EXAMINATION: General: well developed elderly male in NAD HEENT: MM pink/moist, good dentition, no jvd PSY: appropriate / calm  Neuro: Awake/alert, moving all ext's, oriented with occasional confusion / easily re-oriented  CV: s1s2 rrr, no m/r/g PULM: even/non-labored, right lung with  basilar posterior/lateral crackles  DH:RCBU, non-tender, bsx4 active  Extremities: warm/dry, trace BLE edema  Skin: no rashes or lesions    Recent Labs Lab 11/11/16 0824  NA 142  K 4.1  CL 108  CO2 25  BUN 42*  CREATININE 2.28*  GLUCOSE 159*    Recent Labs Lab 11/11/16 0824  HGB 13.5  HCT 42.9  WBC 17.2*  PLT 195   Dg Chest Port 1 View  Result Date: 11/11/2016 CLINICAL DATA:  Fever and altered mental status today. EXAM: PORTABLE CHEST 1 VIEW COMPARISON:  PA and lateral chest 09/22/2016 and 04/22/2016. FINDINGS: Right basilar airspace disease identified. The left lung is clear. The chest is hyperexpanded. Heart size is normal. No pneumothorax or pleural fluid. Aortic atherosclerosis is identified. Pacing device is noted. IMPRESSION: Right lower lobe airspace disease most consistent with pneumonia. Recommend followup to clearing. Emphysema. Atherosclerosis. Electronically Signed   By: Inge Rise M.D.   On: 11/11/2016 09:25   SIGNIFICANT EVENTS  4/13  Admit with RLL PNA   STUDIES:    CULTURES:  BCx2 4/13 >>  UA 4/13 >> negative  Sputum 4/13 >>   ABX:  Vanco 4/13 >>  Cefepime 4/13 >>    ASSESSMENT / PLAN:  Discussion: 83 y/o M with hx of COPD admitted 4/13 with fever and RLL infiltrate concerning for PNA.     RLL PNA - high suspicion for aspiration  Plan: Continue abx as above, D1/7 Trend PCT and narrow abx as able (hx CKD / Vanco) Intermittent CXR Pulmonary hygiene - IS, mobilize  Follow up with Pulmonary as outpatient with CXR > arranged, see d/c section    Severe COPD (FEV1 29% predicted) Plan: Brovana + Pulmicort BID with Q6 atrovent while inpatient  Return to Symbicort / Spiriva at [facility]  Plan: Monitor intermittent CXR    Hx Achalasia s/p Botox Injections (2016), EGD with stretching (2017) Plan: Monitor for GI symptoms Continue home PPI Follow up with GI as outpatient as may be contributing to aspiration    Consider SLP eval for swallowing to determine if diet modifications needed   Kyphosis Plan: No acute interventions   PCCM will be available as needed. Please call if new needs arise.  Noe Gens, NP-C [facility] Pgr: [phone] or if no answer [phone] 11/11/2016, 9:43 AM

## 2016-11-11 NOTE — ED Notes (Signed)
MD REES states to reassess blood pressure after 500CC infuse due to CHF and ejection fracture.

## 2016-11-11 NOTE — ED Notes (Signed)
Pt was given 1G of Tylenol  by ems en route.

## 2016-11-11 NOTE — H&P (Signed)
[facility] Admission History and Physical Service Pager: [phone]  Patient name: Antonio Ray Medical record number: [id number] Date of birth: [birth year] Age: 83 y.o. Gender: male  Primary Care Provider: [name], MD Consultants: CCM Code Status: Full  Chief Complaint: Confusion, SOB  Assessment and Plan: Antonio Ray is a 83 y.o. male presenting with RLL pneumonia. PMH is significant for HFrEF (EF 20-25%) s/p St. Jude ICD placement in 2012, COPD GOLD IV, T2DM, CAD s/p MI with PCI to LAD in 2006, CKD III, sick sinus syndrome, HLD, BPH, dysphagia/achalasia s/p botox injections in 2016, hx aspiration pneumonia, mild cognitive impairment.  Septic Shock 2/2 RLL pneumonia: CAP vs aspiration pneumonia. Meeting sepsis criteria with temperature of 101.70F, WBC 17.2, and RR in the low 20s. Concern for shock with hypotension (MAPs in the low 60s), but lactic acid was 1.31. qsofa 2 for RR and hypotension. CXR with RLL pneumonia. UA was non-infectious and Pt does not have any risk factors for isolated bacteremia (no indwelling lines, not on HD), so think this pneumonia is the likely source. Concern for aspiration pneumonia given his recent worsening in swallowing function. Currently requiring 2L O2 by [place]. On exam, he has normal work of breathing with crackles in the right lung base. - Admit to stepdown under inpatient status, attending Dr. Ree Kida. - CCM following, appreciate recommendations. Will see today and provide recommendations and will be available for any concerns over the weekend. - s/p Vanc and Cefepime in the ED, switch to Vanc and Zosyn for coverage of aspiration pneumonia.  - s/p 500cc bolus in the ED. Will give another 500cc bolus now and monitor BPs closely. - Procalcitonin pending - Blood cultures pending - Urinary strep pneumonia and legionella pending - Check troponin x 1 to rule out cardiac etiology - Duonebs q6hrs prn - Incentive spirometry  -  Cardiac monitoring - Continue supplemental O2, will wean as able - PT/OT  Elevated Creatinine in CKD III: Cr 2.28. Baseline 1.7-2.1. - s/p 500cc bolus in the ED, will give another 500cc bolus now - Holding Lasix for now - Avoid nephrotoxic agents - Repeat BMET in the AM  Achalasia/Dysphagia: Follows with [facility]. s/p balloon dilatation in 2017 and botox injections in 2016. Has an upcoming appointment with GI scheduled on 4/16. - Discussed with GI. Think she needs outpatient follow-up for endoscopy. Nothing to do while hospitalized - Holding home Protonix '20mg'$  daily while NPO - SLP consult  HFrEF: Follows with Dr. Martinique (Cardiology). St. Jude ICD placed in 2012. Most recent ECHO in 04/2016 with EF 20-25%. Not on a beta blocker or ACEI due to CKD and inability of BP to tolerate. Appears euvolemic on exam. - Holding home Lasix '40mg'$  PO daily for now while hypotensive - s/p 500cc bolus of NS in the ED, will give another 500cc bolus now. Will watch respiratory status closely and add back Lasix as appropriate.  COPD: GOLD IV. Follows with Dr. [place] Bells. No wheezing appreciated on exam. - Continue home Spiriva - On Symbicort at home, switch to [facility] (on formulary) while hospitalized - Duonebs q6hrs prn - CCM to set up follow-up appointment with Dr. [place] Bells.  CAD: s/p MI with PCI to LAD in 2006, also with remote MI in 1994. Most recent cath was in 2007 and showed akinesia of the distal anterior wall, distal inferior wall, and akinesia of the apex. - Hold home Nitroglycerin patch 0.2 mg daily while hypotensive - Holding home Aspirin '81mg'$  for now while  NPO  T2DM: Uncontrolled. Glucose in the ED was 159. Last A1c was 9.9% in 07/2016. Takes Lantus 30 units in the AM and Novolog 6 units with breakfast, 5 units with lunch, and 5 units with dinner. - CBGs every 4 hours - Holding Lantus while NPO - Sensitive SSI every 4 hours - Recheck A1c  HLD: Last lipid panel in 2014 with Chol 136, HDL 32, LDL  76, TG 142 - Holding home Pravastatin 41m daily and Fenofibrate 1493m daily while NPO  Mild Cognitive Impairment: Stable - Holding home Memantine 1067mid and Aricept 58m70mily while NPO  Seasonal Allergies: Stable - Holding home Claritin while NPO  FEN/GI: NPO for now pending SLP consult. Prophylaxis: Lovenox  Disposition: Pending improvement in respiratory status. Anticipate discharge home in 3-4 days.  History of Present Illness:  Antonio Ray 83 y73. male presenting with confusion. He had trouble "navigating" and "dialing the phone" starting this morning. He also noted some dizziness. He was previously feeling normal. His son left him last night at 7:00pm and he was acting completely normal. His daughter checked on him this morning and noticed that he was more confused. She checked his O2 saturation and it was 90% (it normally runs ~96%). He did not have any issues with his achalasia yesterday. He has had issues with coughing up clear phlegm in the past. He did have some problems with food coming up 2 days ago and he has noted recent worsening in his swallowing over the last few months. He notes shortness of breath starting this morning. He has had a mild cough that is non-productive. He endorses fevers and chills. No chest pain. No orthopnea, no lower extremity edema.  At baseline, he lives by himself. He ambulates independently. He takes care of himself completely. He just retired in 2015. 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 O2 saturations initially 90% but improved to 96-98% on 2L O2 by [place]. Labs significant for Cr 2.28, WBC 17.2 with a left shift, trop 0.03, lactic acid 1.31. UA without signs of infection. EKG with no acute ST or T wave changes. CXR with RLL pneumonia. Blood cultures were drawn and patient was treated with Vancomycin and Cefepime. He was given a duoneb  x 1. He was also given a 500ml13mus of NS.  Review Of Systems: Per HPI with the following additions: see below.  Review of Systems  Constitutional: Positive for chills and fever.  HENT: Negative for congestion and sore throat.   Eyes: Negative for blurred vision and double vision.  Respiratory: Positive for cough and shortness of breath. Negative for sputum production.   Cardiovascular: Negative for chest pain and palpitations.  Gastrointestinal: Negative for abdominal pain, nausea and vomiting.  Genitourinary: Negative for dysuria and urgency.  Musculoskeletal: Negative for falls and myalgias.  Skin: Negative for rash.  Neurological: Positive for dizziness. Negative for headaches.    Patient Active Problem List   Diagnosis Date Noted  . CAP (community acquired pneumonia) 09/22/2016  . Fever chills   . SOB (shortness of breath)   . Acute respiratory failure with hypoxemia ([name]) [place] Acute respiratory failure ([name]) [place]21/2017  . Chronic combined systolic and diastolic congestive heart failure ([name]) [place]21/2017  . Memory loss 04/21/2016  . Allergic rhinitis 04/11/2016  . Chronic systolic CHF (congestive heart failure) ([name]) [place]31/2017  . Mild cognitive  impairment 08/18/2015  . Difficulty in swallowing   . Pneumonia 05/12/2015  . AKI (acute kidney injury) ([place])   . Poorly controlled type 2 diabetes mellitus with circulatory disorder ([place])   . Chronic systolic congestive heart failure ([place])   . Chronic kidney disease, stage III (moderate)   . Other specified hypotension   . CKD (chronic kidney disease), stage III   . Sepsis due to pneumonia ([place])   . Aspiration pneumonia due to food (regurgitated) ([place])   . Pneumonitis   . Other emphysema ([place])   . Systolic CHF, acute on chronic ([name])   . AICD (automatic cardioverter/defibrillator) present   . CAD S/P percutaneous coronary angioplasty   . Acute respiratory failure with hypoxia ([place]) 04/03/2015  . Cardiomyopathy, ischemic  04/03/2015  . Elevated troponin 04/03/2015  . Sepsis ([place]) 03/31/2015  . Blood poisoning ([place])   . COPD exacerbation ([place])   . HCAP (healthcare-associated pneumonia) and recurrent Aspiration Pneumonia 03/21/2015  . Aspiration into airway 03/21/2015  . Achalasia 03/21/2015  . Dysphagia   . Aspiration pneumonia ([place])   . Esophageal dysphagia   . Esophageal dysmotilities   . PNA (pneumonia) 02/07/2015  . COPD with acute exacerbation ([place]) 01/14/2015  . Left ventricular apical thrombus 01/14/2015  . Acute on chronic systolic CHF (congestive heart failure) ([place]) 01/14/2015  . ARF (acute renal failure) ([place]) 01/14/2015  . Dysphagia, unspecified(787.20) 10/30/2013  . COPD GOLD IV 10/07/2013  . Influenza with pneumonia 09/11/2013  . Leukocytosis 09/11/2013  . Pneumonia, community acquired 09/10/2013  . Acute encephalopathy 09/10/2013  . Renal failure (ARF), acute on chronic ([name]) 09/10/2013  . Automatic implantable cardioverter-defibrillator in situ 07/06/2012  . CKD (chronic kidney disease) stage 3, GFR 30-59 ml/min 06/08/2012  . GERD (gastroesophageal reflux disease) 09/26/2011  . BPH (benign prostatic hyperplasia) 08/22/2011  . COPD (chronic obstructive pulmonary disease) ([place]) 07/17/2011  . S/P ICD (internal cardiac defibrillator) procedure 04/20/2011  . Coronary artery disease   . Sick sinus syndrome ([place])   . Acute on chronic combined systolic and diastolic congestive heart failure, NYHA class 3 ([place])   . Hyperlipidemia   . PVC's (premature ventricular contractions)     Past Medical History: Past Medical History:  Diagnosis Date  . Achalasia   . AICD (automatic cardioverter/defibrillator) present 03/30/2011   Analyze ST study patient  . Anginal pain ([place])   . BPH (benign prostatic hyperplasia)   . CHF (congestive heart failure) ([place])   . Chronic systolic dysfunction of left ventricle    EF 30-35%, CLASS II - III SYMPTOMS; intolerant to Coreg  . CKD (chronic kidney  disease) stage 3, GFR 30-59 ml/min 06/08/2012  . COPD (chronic obstructive pulmonary disease) ([place])   . Coronary artery disease    History of remote anterior MI with PCI to LAD in 2006; most recent cath 2007, no intervention required  . DOE (dyspnea on exertion)    with heavy exertion  . Dysrhythmia   . Esophageal dysmotility   . GERD (gastroesophageal reflux disease)   . Head injury, closed, with concussion 2000ish  . Heart murmur    hx  . HOH (hard of hearing)   . Hyperlipidemia   . Memory loss    improved  . Myocardial infarction 1994   ANTERIOR  . Pneumonia "several times"   aspiration pna with at least 3 admits for this 2016.   Marland Kitchen PVC's (premature ventricular contractions)   . Renal failure   . Skin cancer "several"   "forearms; head"  .  Type II diabetes mellitus ([place])     Past Surgical History: Past Surgical History:  Procedure Laterality Date  . BALLOON DILATION N/A 09/09/2015   Procedure: BALLOON DILATION;  Surgeon: Mauri Pole, MD;  Location: [facility];  Service: Endoscopy;  Laterality: N/A;  rigiflex achalasia balloon dilators  . BOTOX INJECTION N/A 04/08/2015   Procedure: BOTOX INJECTION;  Surgeon: Milus Banister, MD;  Location: [facility];  Service: Endoscopy;  Laterality: N/A;  . CARDIAC CATHETERIZATION  01/11/2006   DEMONSTRATES AKINESIA OF THE DISTAL ANTERIOR WALL, DISTAL INFERIOR WALL AND AKINESIA OF THE APEX. THE BASAL SEGMENTS CONTRACT WELL AND OVERALL EF 35%  . CATARACT EXTRACTION W/ INTRAOCULAR LENS  IMPLANT, BILATERAL  08/2014-09/2014  . CHOLECYSTECTOMY  2/11  . CORONARY ANGIOPLASTY  1994   TO THE LAD  . ESOPHAGEAL MANOMETRY N/A 03/16/2015   Procedure: ESOPHAGEAL MANOMETRY (EM);  Surgeon: Jerene Bears, MD;  Location: [facility];  Service: Gastroenterology;  Laterality: N/A;  . ESOPHAGOGASTRODUODENOSCOPY N/A 04/08/2015   Procedure: ESOPHAGOGASTRODUODENOSCOPY (EGD);  Surgeon: Milus Banister, MD;  Location: [facility];  Service: Endoscopy;   Laterality: N/A;  . ESOPHAGOGASTRODUODENOSCOPY (EGD) WITH PROPOFOL N/A 09/09/2015   Procedure: ESOPHAGOGASTRODUODENOSCOPY (EGD) WITH PROPOFOL;  Surgeon: Mauri Pole, MD;  Location: [facility];  Service: Endoscopy;  Laterality: N/A;  pt. is to have gastrografin xray post recovery-do not discharge until results are back  . FOOT FRACTURE SURGERY Right 1980's  . INSERT / REPLACE / REMOVE PACEMAKER    . SKIN CANCER EXCISION  "several"   "forearms, head"    Social History: Social History  Substance Use Topics  . Smoking status: Former Smoker    Packs/day: 1.00    Years: 35.00    Types: Cigarettes    Quit date: 08/01/1992  . Smokeless tobacco: Never Used  . Alcohol use No   Additional social history: Lives alone. Takes full care of himself. Please also refer to relevant sections of EMR.  Family History: Family History  Problem Relation Age of Onset  . Stroke Father     Mother history unknown - never met her  . Colon cancer Neg Hx     Allergies and Medications: Allergies  Allergen Reactions  . Codeine Other (See Comments)    Gets very angry, disoriented  . Dilaudid [Hydromorphone Hcl] Other (See Comments)    VERY AGITATED, HOSTILE  . Flomax [Tamsulosin Hcl] Shortness Of Breath  . Morphine And Related Other (See Comments)    VERY AGITATED, HOSTILE  . Sulfa Antibiotics Shortness Of Breath  . Beta Adrenergic Blockers Other (See Comments)    ? disorientation  . Carvedilol Other (See Comments)    DISORIENTATION   No current facility-administered medications on file prior to encounter.    Current Outpatient Prescriptions on File Prior to Encounter  Medication Sig Dispense Refill  . albuterol (PROVENTIL HFA;VENTOLIN HFA) 108 (90 Base) MCG/ACT inhaler Inhale 2 puffs into the lungs every 6 (six) hours as needed for wheezing or shortness of breath. 1 Inhaler 2  . aspirin 81 MG EC tablet Take 81 mg by mouth daily.      . Cholecalciferol (VITAMIN D PO) Take 2,000 Units by mouth  daily.     Marland Kitchen donepezil (ARICEPT) 10 MG tablet Take 1 tablet (10 mg total) by mouth every morning. 90 tablet 3  . fenofibrate (TRICOR) 145 MG tablet TAKE 1 TABLET BY MOUTH EVERY DAY 90 tablet 3  . furosemide (LASIX) 20 MG tablet Take 2 tablets (40 mg total)  by mouth daily. 90 tablet 3  . glucose blood (FREESTYLE TEST STRIPS) test strip Use as instructed to check sugar 7 times daily 500 each 5  . insulin aspart (NOVOLOG FLEXPEN) 100 UNIT/ML FlexPen Inject 5-6 Units into the skin See admin instructions. Inject 6 units SQ with breakfast, 5 units SQ with lunch and 5 units SQ with dinner    . Insulin Glargine (BASAGLAR KWIKPEN) 100 UNIT/ML SOPN Inject 0.3 mLs (30 Units total) into the skin every morning. 10 pen 0  . Insulin Pen Needle (CAREFINE PEN NEEDLES) 32G X 5 MM MISC Use 4x a day 200 each 5  . loratadine (CLARITIN) 10 MG tablet Take 1 tablet (10 mg total) by mouth daily. 30 tablet 1  . memantine (NAMENDA) 10 MG tablet Take one tablet twice daily. Please call [phone] for an appointment. 180 tablet 0  . Multiple Vitamins-Minerals (PRESERVISION AREDS 2) CAPS Take 1 capsule by mouth 2 (two) times daily.    . nitroGLYCERIN (NITRODUR - DOSED IN MG/24 HR) 0.2 mg/hr patch Place 1 patch (0.2 mg total) onto the skin daily. 90 patch 3  . pantoprazole (PROTONIX) 20 MG tablet Take 1 tablet (20 mg total) by mouth daily. 30 tablet 11  . pravastatin (PRAVACHOL) 40 MG tablet Take 1 tablet (40 mg total) by mouth daily. 90 tablet 1  . SPIRIVA HANDIHALER 18 MCG inhalation capsule INHALE CONTENTS OF 1 CAPSULE VIA HANDIHALER ONCE DAILY 30 capsule 4  . SYMBICORT 160-4.5 MCG/ACT inhaler INHALE 2 PUFFS BY MOUTH TWICE DAILY 10.2 g 5    Objective: BP (!) 94/50   Pulse 83   Temp (!) 101.4 F (38.6 C) (Rectal)   Resp (!) 22   Ht _0  (1.651 m)   Wt 202 lb (91.6 kg)   SpO2 95%   BMI 33.61 kg/m  Exam: General: Well-appearing, in NAD Eyes: EOMI, PERRLA, no scleral icterus. ENTM: Nose normal, oropharynx  clear Neck: Supple, full ROM, no JVD Cardiovascular: RRR, no murmurs Respiratory: Normal work of breathing, able to speak in full sentences, mildly decreased air movement throughout all lung fields, crackles present in the right lung base, no wheezing. Gastrointestinal: +BS, soft, non-tender, non-distended MSK: No edema, warm and well-perfused Derm: Very dry skin in the feet and lower legs, no rashes  Neuro: Awake, alert, oriented x 3, CN 2-12 intact, no focal deficits, moves all extremities Psych: Normal behavior, appropriate affect  Labs and Imaging: CBC BMET   Recent Labs Lab 11/11/16 0824  WBC 17.2*  HGB 13.5  HCT 42.9  PLT 195    Recent Labs Lab 11/11/16 0824  NA 142  K 4.1  CL 108  CO2 25  BUN 42*  CREATININE 2.28*  GLUCOSE 159*  CALCIUM 8.9     CXR- RLL pneumonia  Sela Hua, MD 11/11/2016, 9:55 AM PGY-2, [place] Intern pager: [phone], text pages welcome

## 2016-11-11 NOTE — ED Notes (Signed)
Family at bedside. 

## 2016-11-11 NOTE — ED Triage Notes (Signed)
Pt arrives by [facility] from home. Pt lives alone and was LSN last night at 9am. Per daughter he was confused this am did not know the time this morning with daughter. On arrival pt is alert and oriented to person place and time.

## 2016-11-11 NOTE — ED Notes (Signed)
Patient was given a half cup of ice water.

## 2016-11-12 ENCOUNTER — Encounter (HOSPITAL_COMMUNITY): Payer: Self-pay | Admitting: *Deleted

## 2016-11-12 DIAGNOSIS — A419 Sepsis, unspecified organism: Principal | ICD-10-CM

## 2016-11-12 DIAGNOSIS — K220 Achalasia of cardia: Secondary | ICD-10-CM

## 2016-11-12 DIAGNOSIS — J690 Pneumonitis due to inhalation of food and vomit: Secondary | ICD-10-CM

## 2016-11-12 LAB — BASIC METABOLIC PANEL
Anion gap: 7 (ref 5–15)
BUN: 32 mg/dL — AB (ref 6–20)
CHLORIDE: 107 mmol/L (ref 101–111)
CO2: 27 mmol/L (ref 22–32)
CREATININE: 1.93 mg/dL — AB (ref 0.61–1.24)
Calcium: 8.6 mg/dL — ABNORMAL LOW (ref 8.9–10.3)
GFR calc non Af Amer: 31 mL/min — ABNORMAL LOW (ref >60)
GFR, EST AFRICAN AMERICAN: 36 mL/min — AB (ref >60)
Glucose, Bld: 107 mg/dL — ABNORMAL HIGH (ref 65–99)
Potassium: 4.5 mmol/L (ref 3.5–5.1)
Sodium: 141 mmol/L (ref 135–145)

## 2016-11-12 LAB — CBC
HCT: 40.8 % (ref 39.0–52.0)
Hemoglobin: 12.8 g/dL — ABNORMAL LOW (ref 13.0–17.0)
MCH: 28.8 pg (ref 26.0–34.0)
MCHC: 31.4 g/dL (ref 30.0–36.0)
MCV: 91.9 fL (ref 78.0–100.0)
PLATELETS: 181 10*3/uL (ref 150–400)
RBC: 4.44 MIL/uL (ref 4.22–5.81)
RDW: 14.5 % (ref 11.5–15.5)
WBC: 15.5 10*3/uL — ABNORMAL HIGH (ref 4.0–10.5)

## 2016-11-12 LAB — GLUCOSE, CAPILLARY
GLUCOSE-CAPILLARY: 94 mg/dL (ref 65–99)
GLUCOSE-CAPILLARY: 112 mg/dL — AB (ref 65–99)
GLUCOSE-CAPILLARY: 194 mg/dL — AB (ref 65–99)
Glucose-Capillary: 207 mg/dL — ABNORMAL HIGH (ref 65–99)
Glucose-Capillary: 163 mg/dL — ABNORMAL HIGH (ref 65–99)
Glucose-Capillary: 175 mg/dL — ABNORMAL HIGH (ref 65–99)

## 2016-11-12 LAB — URINE CULTURE: Culture: NO GROWTH

## 2016-11-12 LAB — LEGIONELLA PNEUMOPHILA SEROGP 1 UR AG: L. pneumophila Serogp 1 Ur Ag: NEGATIVE

## 2016-11-12 LAB — HEMOGLOBIN A1C
Hgb A1c MFr Bld: 7.9 % — ABNORMAL HIGH (ref 4.8–5.6)
Mean Plasma Glucose: 180 mg/dL

## 2016-11-12 LAB — TROPONIN I
TROPONIN I: 0.03 ng/mL — AB (ref <0.03)
Troponin I: 0.05 ng/mL (ref <0.03)
Troponin I: 0.07 ng/mL (ref <0.03)
Troponin I: 0.07 ng/mL (ref <0.03)
Troponin I: 0.03 ng/mL (ref <0.03)

## 2016-11-12 MED ORDER — PRAVASTATIN SODIUM 40 MG PO TABS
40.0000 mg | ORAL_TABLET | Freq: Every day | ORAL | Status: DC
  Administered 2016-11-12 – 2016-11-14 (×3): 40 mg via ORAL
  Filled 2016-11-12 (×3): qty 1

## 2016-11-12 MED ORDER — ACETAMINOPHEN 325 MG PO TABS
650.0000 mg | ORAL_TABLET | Freq: Four times a day (QID) | ORAL | Status: DC | PRN
  Administered 2016-11-12: 650 mg via ORAL
  Filled 2016-11-12: qty 2

## 2016-11-12 MED ORDER — DONEPEZIL HCL 10 MG PO TABS
10.0000 mg | ORAL_TABLET | Freq: Every morning | ORAL | Status: DC
  Administered 2016-11-12 – 2016-11-14 (×3): 10 mg via ORAL
  Filled 2016-11-12 (×3): qty 1

## 2016-11-12 MED ORDER — ASPIRIN EC 81 MG PO TBEC
81.0000 mg | DELAYED_RELEASE_TABLET | Freq: Every day | ORAL | Status: DC
  Administered 2016-11-12 – 2016-11-14 (×3): 81 mg via ORAL
  Filled 2016-11-12 (×3): qty 1

## 2016-11-12 MED ORDER — INSULIN GLARGINE 100 UNIT/ML ~~LOC~~ SOLN
15.0000 [IU] | Freq: Every day | SUBCUTANEOUS | Status: DC
  Administered 2016-11-12 – 2016-11-13 (×2): 15 [IU] via SUBCUTANEOUS
  Filled 2016-11-12 (×2): qty 0.15

## 2016-11-12 MED ORDER — FENOFIBRATE 160 MG PO TABS
160.0000 mg | ORAL_TABLET | Freq: Every day | ORAL | Status: DC
  Administered 2016-11-12 – 2016-11-14 (×3): 160 mg via ORAL
  Filled 2016-11-12 (×3): qty 1

## 2016-11-12 MED ORDER — LORATADINE 10 MG PO TABS
10.0000 mg | ORAL_TABLET | Freq: Every day | ORAL | Status: DC
  Administered 2016-11-12 – 2016-11-14 (×3): 10 mg via ORAL
  Filled 2016-11-12 (×3): qty 1

## 2016-11-12 MED ORDER — INSULIN ASPART 100 UNIT/ML ~~LOC~~ SOLN: 0.0000 [IU] | Freq: Three times a day (TID) | SUBCUTANEOUS | Status: DC

## 2016-11-12 MED ORDER — PANTOPRAZOLE SODIUM 20 MG PO TBEC
20.0000 mg | DELAYED_RELEASE_TABLET | Freq: Every day | ORAL | Status: DC
  Administered 2016-11-12 – 2016-11-14 (×3): 20 mg via ORAL
  Filled 2016-11-12 (×3): qty 1

## 2016-11-12 MED ORDER — MEMANTINE HCL 10 MG PO TABS
10.0000 mg | ORAL_TABLET | Freq: Two times a day (BID) | ORAL | Status: DC
  Administered 2016-11-12 – 2016-11-14 (×4): 10 mg via ORAL
  Filled 2016-11-12 (×4): qty 1

## 2016-11-12 NOTE — Evaluation (Signed)
Clinical/Bedside Swallow Evaluation Patient Details  Name: Antonio Ray MRN: [id number] Date of Birth: 1934/02/18  Today's Date: 11/12/2016 Time: SLP Start Time (ACUTE ONLY): 0938 SLP Stop Time (ACUTE ONLY): 0910 SLP Time Calculation (min) (ACUTE ONLY): 49 min  Past Medical History:  Past Medical History:  Diagnosis Date  . Achalasia   . AICD (automatic cardioverter/defibrillator) present 03/30/2011   Analyze ST study patient  . Anginal pain ([place])   . BPH (benign prostatic hyperplasia)   . CHF (congestive heart failure) ([place])   . Chronic systolic dysfunction of left ventricle    EF 30-35%, CLASS II - III SYMPTOMS; intolerant to Coreg  . CKD (chronic kidney disease) stage 3, GFR 30-59 ml/min 06/08/2012  . COPD (chronic obstructive pulmonary disease) ([place])   . Coronary artery disease    History of remote anterior MI with PCI to LAD in 2006; most recent cath 2007, no intervention required  . DOE (dyspnea on exertion)    with heavy exertion  . Dysrhythmia   . Esophageal dysmotility   . GERD (gastroesophageal reflux disease)   . Head injury, closed, with concussion 2000ish  . Heart murmur    hx  . HOH (hard of hearing)   . Hyperlipidemia   . Memory loss    improved  . Myocardial infarction ([place]) 1994   ANTERIOR  . Pneumonia "several times"   aspiration pna with at least 3 admits for this 2016.   Marland Kitchen PVC's (premature ventricular contractions)   . Renal failure   . Skin cancer "several"   "forearms; head"  . Type II diabetes mellitus ([place])    Past Surgical History:  Past Surgical History:  Procedure Laterality Date  . BALLOON DILATION N/A 09/09/2015   Procedure: BALLOON DILATION;  Surgeon: Mauri Pole, MD;  Location: [facility];  Service: Endoscopy;  Laterality: N/A;  rigiflex achalasia balloon dilators  . BOTOX INJECTION N/A 04/08/2015   Procedure: BOTOX INJECTION;  Surgeon: Milus Banister, MD;  Location: [facility];  Service: Endoscopy;  Laterality: N/A;  .  CARDIAC CATHETERIZATION  01/11/2006   DEMONSTRATES AKINESIA OF THE DISTAL ANTERIOR WALL, DISTAL INFERIOR WALL AND AKINESIA OF THE APEX. THE BASAL SEGMENTS CONTRACT WELL AND OVERALL EF 35%  . CATARACT EXTRACTION W/ INTRAOCULAR LENS  IMPLANT, BILATERAL  08/2014-09/2014  . CHOLECYSTECTOMY  2/11  . CORONARY ANGIOPLASTY  1994   TO THE LAD  . ESOPHAGEAL MANOMETRY N/A 03/16/2015   Procedure: ESOPHAGEAL MANOMETRY (EM);  Surgeon: Jerene Bears, MD;  [facility];  Service: Gastroenterology;  Laterality: N/A;  . ESOPHAGOGASTRODUODENOSCOPY N/A 04/08/2015   Procedure: ESOPHAGOGASTRODUODENOSCOPY (EGD);  Surgeon: Milus Banister, MD;  Location: [facility];  Service: Endoscopy;  Laterality: N/A;  . ESOPHAGOGASTRODUODENOSCOPY (EGD) WITH PROPOFOL N/A 09/09/2015   Procedure: ESOPHAGOGASTRODUODENOSCOPY (EGD) WITH PROPOFOL;  Surgeon: Mauri Pole, MD;  Location: [facility];  Service: Endoscopy;  Laterality: N/A;  pt. is to have gastrografin xray post recovery-do not discharge until results are back  . FOOT FRACTURE SURGERY Right 1980's  . INSERT / REPLACE / REMOVE PACEMAKER    . SKIN CANCER EXCISION  "several"   "forearms, head"   HPI:  Antonio Rathman Cagleis a 83 y.o.malepresenting with RLL pneumonia. PMH is significant for HFrEF (EF 20-25%) s/p St. Jude ICD placement in 2012, COPD GOLD IV, T2DM, CAD s/p MI with PCI to LAD in 2006, CKD III, sick sinus syndrome, HLD, BPH, dysphagia/achalasia s/p botox injections in 2016, hx aspiration pneumonia, mild cognitive impairment.   Assessment /  Plan / Recommendation Clinical Impression  Pt with hx of severe esophageal dysphagia/achalasia with diffuse esophageal dysmotility s/p botox injections in 2016 and s/p balloon dilation 2017.   Pts current symptoms appear consistent with previous ST encounters, esophageal based difficulty. Pt with delayed throat clear mid way through PO trials and complaints of globus sensation. Pt eventually with oral expectoration of  clear thick saliva after consuming some PO . Consulted with pts son and daughter as well as MD. Family wishes to proceed with PO diet with known aspirtation risk and have GI follow up as outpatient to further address esophageal issues. Esophageal strategies reviewed with pt and family including positioning, smaller meals, taking breaks. ST to continue to monitor. Will initiate dysphagia 3 (mechanical soft) and thin liquid diet. Full supervision indicated for follow through with esophageal precautions.  SLP Visit Diagnosis: Dysphagia, pharyngoesophageal phase (R13.14)    Aspiration Risk  Moderate aspiration risk    Diet Recommendation   Dysphagia 3 (mechanical soft) thin liquids   Medication Administration: Whole meds with liquid    Other  Recommendations Recommended Consults: Consider GI evaluation;Consider esophageal assessment Oral Care Recommendations: Oral care BID   Follow up Recommendations 24 hour supervision/assistance      Frequency and Duration min 1 x/week          Prognosis Prognosis for Safe Diet Advancement: Fair Barriers to Reach Goals: Severity of deficits      Swallow Study   General Date of Onset: 11/11/16 HPI: Antonio Sellen Cagleis a 83 y.o.malepresenting with RLL pneumonia. PMH is significant for HFrEF (EF 20-25%) s/p St. Jude ICD placement in 2012, COPD GOLD IV, T2DM, CAD s/p MI with PCI to LAD in 2006, CKD III, sick sinus syndrome, HLD, BPH, dysphagia/achalasia s/p botox injections in 2016, hx aspiration pneumonia, mild cognitive impairment. Type of Study: Bedside Swallow Evaluation Previous Swallow Assessment: esophagram, MBS: severe esophageal dysmotility  (s/p balloon dilation ) Diet Prior to this Study: NPO Temperature Spikes Noted: Yes Respiratory Status: Nasal cannula History of Recent Intubation: No Behavior/Cognition: Alert;Cooperative;Pleasant mood Oral Cavity Assessment: Within Functional Limits Oral Cavity - Dentition: Missing dentition Vision:  Functional for self-feeding Self-Feeding Abilities: Able to feed self Patient Positioning: Upright in bed Baseline Vocal Quality: Normal Volitional Cough: Strong Volitional Swallow: Able to elicit    Oral/Motor/Sensory Function Overall Oral Motor/Sensory Function: Within functional limits   Ice Chips Ice chips: Within functional limits   Thin Liquid      Nectar Thick Nectar Thick Liquid: Not tested   Honey Thick Honey Thick Liquid: Not tested   Puree Puree: Impaired Presentation: Spoon Pharyngeal Phase Impairments: Throat Clearing - Delayed;Multiple swallows   Solid   GO   Solid: Impaired Pharyngeal Phase Impairments: Multiple swallows       Arvil Chaco MA, CCC-SLP Acute Care Speech Language Pathologist    Levi Aland 11/12/2016,9:23 AM

## 2016-11-12 NOTE — Evaluation (Signed)
Physical Therapy Evaluation [name] MRN: [id number] DOB: 06-13-1934 Today's Date: 11/12/2016   History of Present Illness  84 y.o. male admitted to [facility] on 11/11/16 due to  confusion and SOB.  Pt dx with septic shock secondary to R LL PNA.  Pt with significant PMHx of DM 2, renal failure (CKD III), PVCs, MI, memory loss, HOH, esophageal dysmotility, DOE, COPD, CAD, CHF, pacemaker, and balloon dialation of esophagus.    Clinical Impression  Pt was able to walk into the hallway, but was unable to maintain O2 sats on RA (see separate report).  He is only mildly unsteady on his feet from being mostly in the bed for the past day and has a RW and cane at home if needed.  He would benefit from HHPT at this point, but may progress before d/c.   PT to follow acutely for deficits listed below.       Follow Up Recommendations Home health PT;Supervision - Intermittent    Equipment Recommendations  None recommended by PT    Recommendations for Other Services   NA    Precautions / Restrictions Precautions Precautions: Other (comment) Precaution Comments: monitor O2 sats      Mobility  Bed Mobility Overal bed mobility: Needs Assistance Bed Mobility: Supine to Sit     Supine to sit: Supervision;HOB elevated     General bed mobility comments: supervision for safety, pt relying heavily on bed rail and using momentum to rock up to sitting EOB even with HOB maximally raised.   Transfers Overall transfer level: Needs assistance Equipment used: None Transfers: Sit to/from Stand Sit to Stand: Min guard         General transfer comment: Min guard assist for safety  Ambulation/Gait Ambulation/Gait assistance: Min guard Ambulation Distance (Feet): 100 Feet Assistive device:  (holding IV pole) Gait Pattern/deviations: Step-through pattern;Staggering right;Staggering left Gait velocity: decreased   General Gait Details: very mildly staggering gait pattern, but  this is his first hallway walk since admission.  His O2 sats did well initially on RA with giat, however, the further we went the more DOE he had and we had to return 2 L O2 [place] to nose to maintain sats above 90 during gati.  DOE 2-3/4 requiring one standing rest break. Pt reported at baseline he had difficulty walking and talking.          Balance Overall balance assessment: Needs assistance Sitting-balance support: Feet supported;No upper extremity supported Sitting balance-Leahy Scale: Good     Standing balance support: Single extremity supported Standing balance-Leahy Scale: Fair                               Pertinent Vitals/Pain Pain Assessment: No/denies pain    Home Living Family/patient expects to be discharged to:: Private residence Living Arrangements: Alone Available Help at Discharge: Family;Available PRN/intermittently (daughter checks in daily in person) Type of Home: House Home Access: Stairs to enter Entrance Stairs-Rails: Left;Right;Can reach both Entrance Stairs-Number of Steps: 2 Home Layout: Two level;Able to live on main level with bedroom/bathroom;Full bath on main level Home Equipment: Cane - single point;Walker - 2 wheels;Walker - 4 wheels Additional Comments: 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     Prior Function Level of Independence: Independent with assistive device(s)         Comments: uses cane only when he goes out and walks in the woods.  Does not use O2  at baseline.      Hand Dominance        Extremity/Trunk Assessment   Upper Extremity Assessment Upper Extremity Assessment: Generalized weakness    Lower Extremity Assessment Lower Extremity Assessment: Generalized weakness    Cervical / Trunk Assessment Cervical / Trunk Assessment: Normal  Communication   Communication: HOH  Cognition Arousal/Alertness: Awake/alert Behavior During Therapy: WFL for tasks assessed/performed Overall Cognitive Status: History of  cognitive impairments - at baseline                                 General Comments: He does demonstrate some memory deficits, but this is reported to be baseline in PMHx          Exercises General Exercises - Upper Extremity Shoulder Flexion: AROM;Both;10 reps Elbow Flexion: AROM;Both;10 reps General Exercises - Lower Extremity Long Arc Quad: AROM;Both;10 reps Hip ABduction/ADduction: AROM;Both;10 reps Hip Flexion/Marching: AROM;Both;10 reps Toe Raises: AROM;Both;20 reps Heel Raises: AROM;Both;20 reps   Assessment/Plan    PT Assessment Patient needs continued PT services  PT Problem List Decreased activity tolerance;Decreased balance;Decreased mobility;Decreased knowledge of use of DME;Cardiopulmonary status limiting activity       PT Treatment Interventions DME instruction;Gait training;Stair training;Functional mobility training;Therapeutic exercise;Therapeutic activities;Neuromuscular re-education;Balance training;Patient/family education    PT Goals (Current goals can be found in the Care Plan section)  Acute Rehab PT Goals Patient Stated Goal: to get better so that he can go home and work on his puzzle.  PT Goal Formulation: With patient Time For Goal Achievement: 11/26/16 Potential to Achieve Goals: Good    Frequency Min 3X/week           End of Session Equipment Utilized During Treatment: Gait belt Activity Tolerance: Treatment limited secondary to medical complications (Comment) (limited by DOE and decreased sats) Patient left: in chair;with call bell/phone within reach;with chair alarm set;with family/visitor present   PT Visit Diagnosis: Muscle weakness (generalized) (M62.81);Unsteadiness on feet (R26.81);Other (comment) (decreased activity tolerance)    Time: 1245-8099 PT Time Calculation (min) (ACUTE ONLY): 37 min   Charges:   PT Evaluation $PT Eval Moderate Complexity: 1 Procedure PT Treatments $Gait Training: 8-22 mins       Aalia Greulich B. [place], [place], DPT [phone]   11/12/2016, 4:21 PM

## 2016-11-12 NOTE — Progress Notes (Signed)
Talked with Speech Therapist this morning and she stated there were 2 therapist here today and Mr. Dworkin would be seen today. Phone #[phone]. Passed this report onto day RN.

## 2016-11-12 NOTE — Progress Notes (Signed)
11/12/2016  SATURATION QUALIFICATIONS: (This note is used to comply with regulatory documentation for home oxygen)  Patient Saturations on Room Air at Rest = 96%  Patient Saturations on Room Air while Ambulating = 70%  Patient Saturations on 2 Liters of oxygen while Ambulating = 94%  Please briefly explain why patient needs home oxygen: Pt desaturates with walking on RA.   Barbarann Ehlers [place], [place], DPT [phone]

## 2016-11-13 LAB — GLUCOSE, CAPILLARY
GLUCOSE-CAPILLARY: 145 mg/dL — AB (ref 65–99)
GLUCOSE-CAPILLARY: 163 mg/dL — AB (ref 65–99)
GLUCOSE-CAPILLARY: 173 mg/dL — AB (ref 65–99)
GLUCOSE-CAPILLARY: 180 mg/dL — AB (ref 65–99)
GLUCOSE-CAPILLARY: 219 mg/dL — AB (ref 65–99)
Glucose-Capillary: 134 mg/dL — ABNORMAL HIGH (ref 65–99)
Glucose-Capillary: 185 mg/dL — ABNORMAL HIGH (ref 65–99)

## 2016-11-13 LAB — BASIC METABOLIC PANEL
Anion gap: 7 (ref 5–15)
BUN: 32 mg/dL — ABNORMAL HIGH (ref 6–20)
CHLORIDE: 105 mmol/L (ref 101–111)
CO2: 26 mmol/L (ref 22–32)
Calcium: 8.6 mg/dL — ABNORMAL LOW (ref 8.9–10.3)
Creatinine, Ser: 2.05 mg/dL — ABNORMAL HIGH (ref 0.61–1.24)
GFR calc non Af Amer: 29 mL/min — ABNORMAL LOW (ref >60)
GFR, EST AFRICAN AMERICAN: 33 mL/min — AB (ref >60)
Glucose, Bld: 187 mg/dL — ABNORMAL HIGH (ref 65–99)
Potassium: 3.9 mmol/L (ref 3.5–5.1)
SODIUM: 138 mmol/L (ref 135–145)

## 2016-11-13 LAB — CBC
HCT: 38.4 % — ABNORMAL LOW (ref 39.0–52.0)
HEMOGLOBIN: 11.8 g/dL — AB (ref 13.0–17.0)
MCH: 28.2 pg (ref 26.0–34.0)
MCHC: 30.7 g/dL (ref 30.0–36.0)
MCV: 91.6 fL (ref 78.0–100.0)
Platelets: 148 10*3/uL — ABNORMAL LOW (ref 150–400)
RBC: 4.19 MIL/uL — ABNORMAL LOW (ref 4.22–5.81)
RDW: 14.6 % (ref 11.5–15.5)
WBC: 10.9 10*3/uL — ABNORMAL HIGH (ref 4.0–10.5)

## 2016-11-13 MED ORDER — AMOXICILLIN-POT CLAVULANATE 500-125 MG PO TABS
1.0000 | ORAL_TABLET | Freq: Two times a day (BID) | ORAL | Status: DC
  Administered 2016-11-13 – 2016-11-14 (×3): 500 mg via ORAL
  Filled 2016-11-13 (×3): qty 1

## 2016-11-13 NOTE — Progress Notes (Signed)
Patient transferred to 5N31, 2200 medications administered. Receiving RN aware. Pt clothing sent with him. Son and family aware and at bedside.

## 2016-11-13 NOTE — Progress Notes (Signed)
Family Medicine Teaching Service Daily Progress Note Intern Pager: [phone]  Patient name: Antonio Ray Medical record number: [id number] Date of birth: 05-Jul-1934 Age: 83 y.o. Gender: male  Primary Care Provider: [name], MD Consultants: CCM Code Status: Full  Pt Overview and Major Events to Date:  4/13 Admitted for pneumonia  Assessment and Plan: Antonio Ray is a 83 y.o. male presenting with RLL pneumonia. PMH is significant for HFrEF (EF 20-25%) s/p St. Jude ICD placement in 2012, COPD GOLD IV, T2DM, CAD s/p MI with PCI to LAD in 2006, CKD III, sick sinus syndrome, HLD, BPH, dysphagia/achalasia s/p botox injections in 2016, hx aspiration pneumonia, mild cognitive impairment.  RLL pneumonia, improving: CAP vs aspiration pneumonia given dysphagia from achalasia. Currently requiring 2.5L O2 by [place]. Afebrile, decreasing WBC. - CCM recommended dulera, albuterol prn, home spiriva. Available as needed. - IV Vanc and Zosyn (4/13-4/15), deescalate to Augmentin (4/15-). Will need total of 7 days, last dose 11/18/16. - Blood cultures NG 1 day - Urinary strep pneumonia neg and legionella neg - Albuterol neb q6hrs prn - Incentive spirometry  - Cardiac monitoring - Continue supplemental O2, will wean as able. Will need to go home on O2. - PT/OT: home health PT   Achalasia/Dysphagia: Follows with [facility]. s/p balloon dilatation in 2017 and botox injections in 2016. Has an upcoming appointment with GI scheduled on 4/16. - Discussed with GI, patient needs outpatient follow-up for endoscopy. Nothing to do while hospitalized - home Protonix 20mg  daily  - SLP recs: patient is continued aspiration risk but discussed with patient and family to proceed with po diet. Dysphagia 3 diet with thin liquids.   CAD: s/p MI with PCI to LAD in 2006, also with remote MI in 1994. Most recent cath was in 2007 and showed akinesia of the distal anterior wall, distal inferior wall, and akinesia of the  apex. No chest pain and troponins downtrended. - Troponins 0.05 > 0.05 >0.07>0.03 likely demand. - Hold home Nitroglycerin patch 0.2 mg daily while low-normotensive - home Aspirin 81mg   HFrEF: Follows with Dr. Martinique (Cardiology). St. Jude ICD placed in 2012. Most recent ECHO in 04/2016 with EF 20-25%. Not on a beta blocker or ACEI due to CKD and inability of BP to tolerate.  - Holding home Lasix 40mg  PO daily for now while low-normotensive. Will watch respiratory status closely and add back Lasix as appropriate.  CKD III: At baseline 1.7-2.1 - Avoid nephrotoxic agents - Monitor BMET  COPD: GOLD IV. Follows with Dr. Lake Bells.  - Continue home Spiriva - On Symbicort at home, switch to [facility] (on formulary) while hospitalized - Albuterol neb q6hrs prn - CCM to set up follow-up appointment with Dr. [place] Bells.  T2DM: A1c 7.9 At home takes Lantus 30 units in the AM and Novolog 6 units with breakfast, 5 units with lunch, and 5 units with dinner. - CBGs AC qHS - Lantus 15U  - Sensitive SSI   HLD: Last lipid panel in 2014 with Chol 136, HDL 32, LDL 76, TG 142 -  home Pravastatin 40mg  daily and Fenofibrate 145mg h daily while NPO  Mild Cognitive Impairment: Stable - home Memantine 10mg  bid and Aricept 10mg  daily   Seasonal Allergies: Stable - home Claritin   FEN/GI: Dysphagia 3 (mechanical soft) diet. protonix Prophylaxis: Lovenox   Disposition: pending medical management  Subjective:  States feels well this morning, breathing is back to baseline. Walked well yesterday and did not feel short of breath. Has no concerns. No  CP, SOB, n/v.   Objective: Temp:  [98 F (36.7 C)-100.9 F (38.3 C)] 98.4 F (36.9 C) (04/15 0346) Pulse Rate:  [59-78] 62 (04/15 0400) Resp:  [19-28] 22 (04/15 0400) BP: (97-139)/(44-116) 139/116 (04/15 0400) SpO2:  [70 %-100 %] 100 % (04/15 0400) Physical Exam: General: Well-appearing, in NAD Cardiovascular: RRR, no murmurs Respiratory: Normal work of  breathing with nasal cannula in place, able to speak in full sentences, mildly decreased air movement throughout all lung fields, no wheezing or crackles Abdomen: +BS, soft, non-tender, non-distended Extremities: no LE edema.  Neuro: Awake, alert, oriented x 3, no focal deficits Psych: Normal behavior, appropriate affect  Laboratory:  Recent Labs Lab 11/11/16 1525 11/12/16 0657 11/13/16 0215  WBC 18.3* 15.5* 10.9*  HGB 12.6* 12.8* 11.8*  HCT 40.4 40.8 38.4*  PLT 179 181 148*    Recent Labs Lab 11/11/16 0824 11/11/16 1525 11/12/16 0657 11/13/16 0215  NA 142  --  141 138  K 4.1  --  4.5 3.9  CL 108  --  107 105  CO2 25  --  27 26  BUN 42*  --  32* 32*  CREATININE 2.28* 2.04* 1.93* 2.05*  CALCIUM 8.9  --  8.6* 8.6*  PROT 5.9*  --   --   --   BILITOT 0.7  --   --   --   ALKPHOS 35*  --   --   --   ALT 24  --   --   --   AST 32  --   --   --   GLUCOSE 159*  --  107* 187*     Imaging/Diagnostic Tests: No results found.   Bufford Lope, DO 11/13/2016, 7:18 AM PGY-1, [place] Intern pager: [phone], text pages welcome

## 2016-11-13 NOTE — Discharge Summary (Signed)
[facility] Discharge Summary  Patient name: Antonio Ray Medical record number: [id number] Date of birth: 23-Jan-1934 Age: 83 y.o. Gender: male Date of Admission: 11/11/2016  Date of Discharge: 11/14/16 Admitting Physician: Lupita Dawn, MD  Primary Care Provider: [name], MD Consultants: CCM  Indication for Hospitalization: pneumonia  Discharge Diagnoses/Problem List:  Aspiration Pneumonia Achalasia/dysphagia CAD HFrEF CKD III COPD T2DM HLD Mild cognitive impairment Season allergies  Disposition: Home  Discharge Condition: Stable, improved  Discharge Exam:  Blood pressure (!) 109/51, pulse 60, temperature 97.6 F (36.4 C), temperature source Oral, resp. rate 18, height 5\' 8"  (1.727 m), weight 89.4 kg (197 lb 1.6 oz), SpO2 98 %. General: Well-appearing, in NAD Cardiovascular: RRR, no murmurs Respiratory: Normal work of breathing with nasal cannula in place, able to speak in full sentences, mildly decreased air movement throughout all lung fields, no wheezing or crackles Abdomen: +BS, soft, non-tender, non-distended Extremities: no LE edema.  Neuro: Awake, alert, oriented x 3, no focal deficits Psych: Normal behavior, appropriate affect  Brief Hospital Course:  Antonio Ray a 83 y.o.malewith PMH significant for HFrEF (EF 20-25%) s/p St. Jude ICD placement in 2012, COPD GOLD IV, T2DM, CAD s/p MI with PCI to LAD in 2006, CKD III, sick sinus syndrome, HLD, BPH, dysphagia/achalasia s/p botox injections in 2016, hx aspiration pneumonia, mild cognitive impairment who was found to have RLL pneumonia on presentation,  Patient presented with some increased confusion compared to baseline at home and a lower O2 saturation that usual per family. Family routinely checks O2 saturation at home and it was 90% (it normally runs ~96%) therefore was brought to the ED. In the ED, pt was hypotensive to 85/52, HR 80s-90s, RR 22-26, temperature 101.14F and  labs were significant for WBC 17.2 with a left shift as well as lactic acid 1.31 with CXR findings of RLL pneumonia that were consistent with septic shock. Patient was admitted and started on Vancomycin and Zosyn for coverage of aspiration pneumonia given he had recent worsening in swallowing function. Patient improved with IV antibiotics and supportive care, therefore was transitioned to oral Augmentin to finish out a course of 7 days total of antibiotics. He was evaluated by SLP during his hospital stay and found to be at continued risk of aspiration due to his dysphasia but patient and family opted for po intake. Patient had a previously scheduled GI appointment to address his underlying achalasia on the day of discharge. He has stable vital signs, was afebrile with normal WBC so was deemed stable for outpatient follow up. He was sent home on home oxygen since likely that his underlying COPD may cause him to be slow to recover from this acute illness.   Issues for Follow Up:  1. Please ensure patient completes his course of Augmentin, last dose 11/18/16 for a total of 7 days of antibiotics for aspiration PNA. 2. Patient has GI appointment to address his achalasia. 3. Please evaluate patient's respiratory status, was discharged on home O2 given his underlying COPD, previously not on oxygen at home. 4. Patient has CCM follow up for his COPD.  Significant Procedures: none  Significant Labs and Imaging:   Recent Labs Lab 11/12/16 0657 11/13/16 0215 11/14/16 0332  WBC 15.5* 10.9* 7.2  HGB 12.8* 11.8* 12.6*  HCT 40.8 38.4* 40.3  PLT 181 148* 152    Recent Labs Lab 11/11/16 0824 11/11/16 1525 11/12/16 0657 11/13/16 0215 11/14/16 0332  NA 142  --  141 138  141  K 4.1  --  4.5 3.9 4.3  CL 108  --  107 105 104  CO2 25  --  27 26 27   GLUCOSE 159*  --  107* 187* 144*  BUN 42*  --  32* 32* 24*  CREATININE 2.28* 2.04* 1.93* 2.05* 1.59*  CALCIUM 8.9  --  8.6* 8.6* 8.8*  ALKPHOS 35*  --   --    --   --   AST 32  --   --   --   --   ALT 24  --   --   --   --   ALBUMIN 3.3*  --   --   --   --     Results/Tests Pending at Time of Discharge: Blood cultures, currently no growth for 2 days.  Discharge Medications:  Allergies as of 11/14/2016      Reactions   Codeine Other (See Comments)   Gets very angry, disoriented   Dilaudid [hydromorphone Hcl] Other (See Comments)   VERY AGITATED, HOSTILE   Flomax [tamsulosin Hcl] Shortness Of Breath   Morphine And Related Other (See Comments)   VERY AGITATED, HOSTILE   Sulfa Antibiotics Shortness Of Breath   Beta Adrenergic Blockers Other (See Comments)   ? disorientation   Carvedilol Other (See Comments)   DISORIENTATION      Medication List    TAKE these medications   albuterol 108 (90 Base) MCG/ACT inhaler Commonly known as:  PROVENTIL HFA;VENTOLIN HFA Inhale 2 puffs into the lungs every 6 (six) hours as needed for wheezing or shortness of breath.   amoxicillin-clavulanate 500-125 MG tablet Commonly known as:  AUGMENTIN Take 1 tablet (500 mg total) by mouth every 12 (twelve) hours.   aspirin 81 MG EC tablet Take 81 mg by mouth daily.   BASAGLAR KWIKPEN 100 UNIT/ML Sopn Inject 0.3 mLs (30 Units total) into the skin every morning.   donepezil 10 MG tablet Commonly known as:  ARICEPT Take 1 tablet (10 mg total) by mouth every morning.   fenofibrate 145 MG tablet Commonly known as:  TRICOR TAKE 1 TABLET BY MOUTH EVERY DAY   furosemide 20 MG tablet Commonly known as:  LASIX Take 2 tablets (40 mg total) by mouth daily.   insulin aspart 100 UNIT/ML FlexPen Commonly known as:  NOVOLOG FLEXPEN Inject 5-6 Units into the skin See admin instructions. Inject 6 units SQ with breakfast, 5 units SQ with lunch and 5 units SQ with dinner What changed:  how much to take  additional instructions   loratadine 10 MG tablet Commonly known as:  CLARITIN Take 1 tablet (10 mg total) by mouth daily.   memantine 10 MG  tablet Commonly known as:  NAMENDA Take one tablet twice daily. Please call [phone] for an appointment. What changed:  how much to take  how to take this  when to take this  additional instructions   nitroGLYCERIN 0.2 mg/hr patch Commonly known as:  NITRODUR - Dosed in mg/24 hr Place 1 patch (0.2 mg total) onto the skin daily. What changed:  additional instructions   pantoprazole 20 MG tablet Commonly known as:  PROTONIX Take 1 tablet (20 mg total) by mouth daily.   pravastatin 40 MG tablet Commonly known as:  PRAVACHOL Take 1 tablet (40 mg total) by mouth daily. What changed:  when to take this   PRESERVISION AREDS 2 Caps Take 1 capsule by mouth 2 (two) times daily.   SPIRIVA HANDIHALER 18 MCG inhalation capsule  Generic drug:  tiotropium INHALE CONTENTS OF 1 CAPSULE VIA HANDIHALER ONCE DAILY   SYMBICORT 160-4.5 MCG/ACT inhaler Generic drug:  budesonide-formoterol INHALE 2 PUFFS BY MOUTH TWICE DAILY   Vitamin D 2000 units tablet Take 2,000 Units by mouth daily.            Durable Medical Equipment        Start     Ordered   11/12/16 1636  For home use only DME oxygen  Once    Question Answer Comment  Mode or (Route) Nasal cannula   Liters per Minute 2   Frequency Continuous (stationary and portable oxygen unit needed)   Oxygen conserving device No   Oxygen delivery system Gas      11/12/16 1636      Discharge Instructions: Please refer to Patient Instructions section of EMR for full details.  Patient was counseled important signs and symptoms that should prompt return to medical care, changes in medications, dietary instructions, activity restrictions, and follow up appointments.   Follow-Up Appointments: Follow-up Information    Rexene Edison, NP Follow up on 11/24/2016.   Specialty:  Pulmonary Disease Why:  Appt at 2:30 PM with follow up Chest XRAY Contact information: [address] [address] [phone]        [name], MD. Schedule an appointment as soon as possible for a visit in 1 week(s).   Specialty:  Family Medicine Why:  Please call and make a hospital follow up appointment. Contact information: [place] [address] [phone]        Nicoletta Ba, PA-C Follow up.   Specialty:  Gastroenterology Why:  Appointment 11/14/2016 at 2:30 PM Contact information: [place] [address] [phone]           Xavier Munger J Laurelai Lepp, DO 11/14/2016, 9:04 AM PGY-1, [place]

## 2016-11-13 NOTE — Discharge Instructions (Signed)
It has been a pleasure taking care of you! You were admitted due to pneumonia. We have treated you with antibiotics. With that your symptoms improved to the point we think it is safe to let you go home and follow up with your primary care doctor. We are discharging you on an antibiotic called Augmentin twice a day that you need to continue taking after you leave the hospital, your last dose will be on 11/18/16.   There could be some changes made to your home medications during this hospitalization. Please, make sure to read the directions before you take them. The names and directions on how to take these medications are found on this discharge paper under medication section.  Please follow up at your primary care doctor's office after you leave the hospital.

## 2016-11-14 ENCOUNTER — Other Ambulatory Visit: Payer: Self-pay | Admitting: Cardiology

## 2016-11-14 ENCOUNTER — Other Ambulatory Visit: Payer: Self-pay | Admitting: Gastroenterology

## 2016-11-14 ENCOUNTER — Encounter: Payer: Self-pay | Admitting: Physician Assistant

## 2016-11-14 ENCOUNTER — Ambulatory Visit (INDEPENDENT_AMBULATORY_CARE_PROVIDER_SITE_OTHER): Payer: Medicare Other | Admitting: Physician Assistant

## 2016-11-14 VITALS — BP 102/54 | Ht 68.5 in | Wt 204.0 lb

## 2016-11-14 DIAGNOSIS — I255 Ischemic cardiomyopathy: Secondary | ICD-10-CM | POA: Diagnosis not present

## 2016-11-14 DIAGNOSIS — K220 Achalasia of cardia: Secondary | ICD-10-CM

## 2016-11-14 DIAGNOSIS — R1310 Dysphagia, unspecified: Secondary | ICD-10-CM | POA: Diagnosis not present

## 2016-11-14 LAB — GLUCOSE, CAPILLARY
Glucose-Capillary: 155 mg/dL — ABNORMAL HIGH (ref 65–99)
Glucose-Capillary: 137 mg/dL — ABNORMAL HIGH (ref 65–99)

## 2016-11-14 LAB — BASIC METABOLIC PANEL
ANION GAP: 10 (ref 5–15)
BUN: 24 mg/dL — ABNORMAL HIGH (ref 6–20)
CO2: 27 mmol/L (ref 22–32)
Calcium: 8.8 mg/dL — ABNORMAL LOW (ref 8.9–10.3)
Chloride: 104 mmol/L (ref 101–111)
Creatinine, Ser: 1.59 mg/dL — ABNORMAL HIGH (ref 0.61–1.24)
GFR calc Af Amer: 45 mL/min — ABNORMAL LOW (ref >60)
GFR calc non Af Amer: 39 mL/min — ABNORMAL LOW (ref >60)
GLUCOSE: 144 mg/dL — AB (ref 65–99)
POTASSIUM: 4.3 mmol/L (ref 3.5–5.1)
Sodium: 141 mmol/L (ref 135–145)

## 2016-11-14 LAB — CBC
HCT: 40.3 % (ref 39.0–52.0)
Hemoglobin: 12.6 g/dL — ABNORMAL LOW (ref 13.0–17.0)
MCH: 28.5 pg (ref 26.0–34.0)
MCHC: 31.3 g/dL (ref 30.0–36.0)
MCV: 91.2 fL (ref 78.0–100.0)
PLATELETS: 152 10*3/uL (ref 150–400)
RBC: 4.42 MIL/uL (ref 4.22–5.81)
RDW: 14 % (ref 11.5–15.5)
WBC: 7.2 10*3/uL (ref 4.0–10.5)

## 2016-11-14 MED ORDER — AMOXICILLIN-POT CLAVULANATE 500-125 MG PO TABS: 1.0000 | ORAL_TABLET | Freq: Two times a day (BID) | ORAL | 0 refills | Status: AC

## 2016-11-14 NOTE — Care Management Note (Signed)
Case Management Note  Patient Details  Name: Antonio Ray MRN: [id number] Date of Birth: 11-23-1933  Subjective/Objective:   83 yr old male admitted with pneumonia.                 Action/Plan: Case manager spoke with patient concerning therapist recommendation for Home Health physical therapy. Patient politely declined [place], stating he has been in bed for 3 days, he doesn't have a problem walking. He states his son and daughter will be checking on him at discharge. No further CM needs.    Expected Discharge Date:  11/14/16               Expected Discharge Plan:  Home/Self Care  In-House Referral:     Discharge planning Services  CM Consult  Post Acute Care Choice:  Home Health Choice offered to:  Patient  DME Arranged:  N/A DME Agency:  NA  HH Arranged:  Patient Refused [place] Agency:  NA  Status of Service:  Completed, signed off  If discussed at [place] of Stay Meetings, dates discussed:    Additional Comments:  Ninfa Meeker, RN 11/14/2016, 10:07 AM

## 2016-11-14 NOTE — Progress Notes (Signed)
Physical Therapy Treatment [name] MRN: [id number] DOB: 12/24/1933 Today's Date: 11/14/2016    History of Present Illness 83 y.o. male admitted to [facility] on 11/11/16 due to  confusion and SOB.  Pt dx with septic shock secondary to R LL PNA.  Pt with significant PMHx of DM 2, renal failure (CKD III), PVCs, MI, memory loss, HOH, esophageal dysmotility, DOE, COPD, CAD, CHF, pacemaker, and balloon dialation of esophagus.      PT Comments    Patient ambulated in hallway and increased distance from previous session, however continues to require O2 to maintain O2 sats and frequent cues to perform PLB. Patient correctly demonstrated incentive spirometer was able to verbalize proper frequency of use.  Patient appears to be steadier on feet from yesterday with no sway during ambulation noted. Patient scored 18/24 on DGI, suggesting need for assistive device. Has rolling walker and cane at home to be used as needed. Patient reports use of a walking stick while ambulating in the community and is a furniture walker in the house. Encouraged pt to use walker or stick when in the home given results on DGI. Patient acknowledged recommendation, but neither agreed or disagreed. This therapist provided min guard assist with no LOB during activities because this was the first time working with patient. Therefore, feel that patient could have performed all activities today with modified independence. Goals could not be met today due pt preference of not using AD; however feel that pt could have achieved goals with AD. Discharge home today per chart. Will continue PT if pt does not d/c  SATURATION QUALIFICATIONS: (This note is used to comply with regulatory documentation for home oxygen)  Patient Saturations on Room Air at Rest = 93%  Patient Saturations on Room Air while Ambulating = 85-88%  Patient Saturations on 3 Liters of oxygen while Ambulating = 88-93%  Please briefly explain why  patient needs home oxygen: Patient continues to Desat on room air with activity. Will need home O2.    Follow Up Recommendations  Home health PT;Supervision - Intermittent (patient currently refusing)     Equipment Recommendations  None recommended by PT    Recommendations for Other Services       Precautions / Restrictions Precautions Precautions: Other (comment) Precaution Comments: monitor O2 sats    Mobility  Bed Mobility               General bed mobility comments: Patient in chair upon arrival  Transfers Overall transfer level: Needs assistance Equipment used: None Transfers: Sit to/from Stand Sit to Stand: Min guard         General transfer comment: Min guard assist for safety  Ambulation/Gait Ambulation/Gait assistance: Min guard Ambulation Distance (Feet): 280 Feet   Gait Pattern/deviations: Step-through pattern Gait velocity: decreased   General Gait Details: His O2 sats did well initially on RA with giat, however, the further we went the more DOE he had and we had to return 3 L O2 [place] to nose to maintain sats above 90 during gati.  DOE 2/4, however pt reported no SOB. Pt reported at baseline he had difficulty walking and talking at the same time.    Stairs Stairs: Yes   Stair Management: Two rails Number of Stairs: 3 General stair comments: ascended and descended 3 steps. Min guard for safety.  Wheelchair Mobility    Modified Rankin (Stroke Patients Only)       Balance Overall balance assessment:  (Supervision) Sitting-balance support: Feet  supported;No upper extremity supported Sitting balance-Leahy Scale: Good     Standing balance support: No upper extremity supported Standing balance-Leahy Scale: Good                   Standardized Balance Assessment Standardized Balance Assessment : Dynamic Gait Index   Dynamic Gait Index Level Surface: Mild Impairment Change in Gait Speed: Mild Impairment Gait with Horizontal Head  Turns: Normal Gait with Vertical Head Turns: Normal Gait and Pivot Turn: Mild Impairment Step Over Obstacle: Mild Impairment Step Around Obstacles: Mild Impairment Steps: Mild Impairment Total Score: 18      Cognition Arousal/Alertness: Awake/alert Behavior During Therapy: WFL for tasks assessed/performed Overall Cognitive Status: History of cognitive impairments - at baseline                                               Pertinent Vitals/Pain Pain Assessment: No/denies pain           PT Goals (current goals can now be found in the care plan section) Acute Rehab PT Goals Patient Stated Goal: to get better so that he can go home and work on his puzzle.  Additional Goals Additional Goal #1: Pt will score >20/24 on DGI. Progress towards PT goals: Progressing toward goals    Frequency    Min 3X/week      PT Plan Current plan remains appropriate       End of Session Equipment Utilized During Treatment: Gait belt Activity Tolerance: Patient tolerated treatment well Patient left: in chair;with call bell/phone within reach;with family/visitor present   PT Visit Diagnosis: Muscle weakness (generalized) (M62.81);Unsteadiness on feet (R26.81);Other (comment) (decreased activity tolerance)     Time: 1100-1120 PT Time Calculation (min) (ACUTE ONLY): 20 min  Charges:  $Gait Training: 8-22 mins                    G Codes:       Benjiman Core, PTA Acute Rehab [place] 11/14/2016, 12:23 PM

## 2016-11-14 NOTE — Progress Notes (Signed)
Discharge instructions (including medications) discussed with and copy provided to patient/caregiver 

## 2016-11-14 NOTE — Progress Notes (Signed)
SATURATION QUALIFICATIONS: (This note is used to comply with regulatory documentation for home oxygen)  Patient Saturations on Room Air at Rest = 93%  Patient Saturations on Room Air while Ambulating = 85-88%  Patient Saturations on 3 Liters of oxygen while Ambulating = 88-93%  Please briefly explain why patient needs home oxygen: Patient continues to Desat on room air with activity. Will need home O2.Thanks  Benjiman Core, PTA Acute Rehab [phone]

## 2016-11-14 NOTE — Patient Instructions (Signed)
You have been scheduled for a Barium Esophogram at [facility] (1st floor of the hospital) on 11-17-16 at 10:30 am. Please arrive 15 minutes prior to your appointment for registration. Make certain not to have anything to eat or drink 6 hours prior to your test. If you need to reschedule for any reason, please contact radiology at [phone] to do so. __________________________________________________________________ A barium swallow is an examination that concentrates on views of the esophagus. This tends to be a double contrast exam (barium and two liquids which, when combined, create a gas to distend the wall of the oesophagus) or single contrast (non-ionic iodine based). The study is usually tailored to your symptoms so a good history is essential. Attention is paid during the study to the form, structure and configuration of the esophagus, looking for functional disorders (such as aspiration, dysphagia, achalasia, motility and reflux) EXAMINATION You may be asked to change into a gown, depending on the type of swallow being performed. A radiologist and radiographer will perform the procedure. The radiologist will advise you of the type of contrast selected for your procedure and direct you during the exam. You will be asked to stand, sit or lie in several different positions and to hold a small amount of fluid in your mouth before being asked to swallow while the imaging is performed .In some instances you may be asked to swallow barium coated marshmallows to assess the motility of a solid food bolus. The exam can be recorded as a digital or video fluoroscopy procedure. POST PROCEDURE It will take 1-2 days for the barium to pass through your system. To facilitate this, it is important, unless otherwise directed, to increase your fluids for the next 24-48hrs and to resume your normal diet.  This test typically takes about 30 minutes to  perform. __________________________________________________________________________________

## 2016-11-14 NOTE — Progress Notes (Signed)
Subjective:    Patient ID: Antonio Ray, male    DOB: November 07, 1933, 83 y.o.   MRN: [id number]  HPI "Ruthann Cancer" is a pleasant 83 year old white male, known to Dr. Silverio Decamp who has diagnosis of achalasia type II. Patient also has multiple comorbidities including congestive heart failure, ischemic cardiomyopathy with EF of 20-25%, dementia, insulin-dependent diabetes mellitus, coronary artery disease, sick sinus syndrome, and COPD Gold IV. Patient was just discharged from the hospital this morning and came straight to the office for this appointment. He was hospitalized over the weekend with pneumonia which was felt to be aspiration-related and was discharged on Augmentin. He has oxygen at home but only uses it as needed. He had undergone EGD in February 2017 with pneumatic balloon dilation to 30 mm. Prior to that he had undergone some Botox injections. Both patient and the son feel that the pneumatic dilation definitely helped his symptoms and he was doing fairly well until about 2 or 3 months ago when his son began noticing some recurrent symptoms. He says if he eats too much or sometimes too fast she will start bringing up of thick clear mucus but rarely regurgitates any food. Appendectomy no that cold drinks and sugary drinks seem to increase production of mucus in the try to avoid these. About 2 weeks ago after they had eaten out, P seem to have swallowed his food fine though other were driving home in the car about 30 minutes later he started bringing up a lot of fluid in the had to pull over to the side of the road. He had one episode last week where he thought he actually "choked" on some soup and then the following day awakened with fever weakness etc. and was admitted to the hospital with pneumonia. He says   he feels much better currently and that his breathing is at baseline. Chest x-ray last done on 11/11/2016 showed right lower lobe airspace disease consistent with pneumonia and  emphysema.  Review of Systems Pertinent positive and negative review of systems were noted in the above HPI section.  All other review of systems was otherwise negative.  Outpatient Encounter Prescriptions as of 11/14/2016  Medication Sig  . albuterol (PROVENTIL HFA;VENTOLIN HFA) 108 (90 Base) MCG/ACT inhaler Inhale 2 puffs into the lungs every 6 (six) hours as needed for wheezing or shortness of breath.  Marland Kitchen amoxicillin-clavulanate (AUGMENTIN) 500-125 MG tablet Take 1 tablet (500 mg total) by mouth every 12 (twelve) hours.  Marland Kitchen aspirin 81 MG EC tablet Take 81 mg by mouth daily.    . Cholecalciferol (VITAMIN D) 2000 units tablet Take 2,000 Units by mouth daily.   Marland Kitchen donepezil (ARICEPT) 10 MG tablet Take 1 tablet (10 mg total) by mouth every morning.  . fenofibrate (TRICOR) 145 MG tablet TAKE 1 TABLET BY MOUTH EVERY DAY  . furosemide (LASIX) 20 MG tablet Take 2 tablets (40 mg total) by mouth daily.  . insulin aspart (NOVOLOG FLEXPEN) 100 UNIT/ML FlexPen Inject 5-6 Units into the skin See admin instructions. Inject 6 units SQ with breakfast, 5 units SQ with lunch and 5 units SQ with dinner (Patient taking differently: Inject 5-7 Units into the skin See admin instructions. Sliding scaIe-depending on meal nject 6 units SQ with breakfast, 5 units SQ with lunch and 5 units SQ with dinner)  . Insulin Glargine (BASAGLAR KWIKPEN) 100 UNIT/ML SOPN Inject 0.3 mLs (30 Units total) into the skin every morning.  . loratadine (CLARITIN) 10 MG tablet Take 1 tablet (10 mg  total) by mouth daily.  . memantine (NAMENDA) 10 MG tablet Take one tablet twice daily. Please call [phone] for an appointment. (Patient taking differently: Take 10 mg by mouth 2 (two) times daily. )  . Multiple Vitamins-Minerals (PRESERVISION AREDS 2) CAPS Take 1 capsule by mouth 2 (two) times daily.  . nitroGLYCERIN (NITRODUR - DOSED IN MG/24 HR) 0.2 mg/hr patch Place 1 patch (0.2 mg total) onto the skin daily. (Patient taking differently: Place 0.2  mg onto the skin daily. Alternates patch placement with odd/even days. Even days on left side, odd days on right side.)  . pantoprazole (PROTONIX) 20 MG tablet Take 1 tablet (20 mg total) by mouth daily.  . pantoprazole (PROTONIX) 20 MG tablet TAKE 1 TABLET BY MOUTH DAILY  . pravastatin (PRAVACHOL) 40 MG tablet Take 1 tablet (40 mg total) by mouth daily. (Patient taking differently: Take 40 mg by mouth at bedtime. )  . SPIRIVA HANDIHALER 18 MCG inhalation capsule INHALE CONTENTS OF 1 CAPSULE VIA HANDIHALER ONCE DAILY  . SYMBICORT 160-4.5 MCG/ACT inhaler INHALE 2 PUFFS BY MOUTH TWICE DAILY  . [DISCONTINUED] acetaminophen (TYLENOL) tablet 650 mg   . [DISCONTINUED] albuterol (PROVENTIL) (2.5 MG/3ML) 0.083% nebulizer solution 2.5 mg   . [DISCONTINUED] amoxicillin-clavulanate (AUGMENTIN) 500-125 MG per tablet 500 mg   . [DISCONTINUED] aspirin EC tablet 81 mg   . [DISCONTINUED] donepezil (ARICEPT) tablet 10 mg   . [DISCONTINUED] enoxaparin (LOVENOX) injection 30 mg   . [DISCONTINUED] fenofibrate tablet 160 mg   . [DISCONTINUED] insulin aspart (novoLOG) injection 0-9 Units   . [DISCONTINUED] insulin glargine (LANTUS) injection 15 Units   . [DISCONTINUED] loratadine (CLARITIN) tablet 10 mg   . [DISCONTINUED] MEDLINE mouth rinse   . [DISCONTINUED] memantine (NAMENDA) tablet 10 mg   . [DISCONTINUED] mometasone-formoterol (DULERA) 100-5 MCG/ACT inhaler 2 puff   . [DISCONTINUED] ondansetron (ZOFRAN) injection 4 mg   . [DISCONTINUED] ondansetron (ZOFRAN) tablet 4 mg   . [DISCONTINUED] pantoprazole (PROTONIX) EC tablet 20 mg   . [DISCONTINUED] pravastatin (PRAVACHOL) tablet 40 mg   . [DISCONTINUED] sodium chloride flush (NS) 0.9 % injection 3 mL   . [DISCONTINUED] tiotropium (SPIRIVA) inhalation capsule 18 mcg    No facility-administered encounter medications on file as of 11/14/2016.    Allergies  Allergen Reactions  . Codeine Other (See Comments)    Gets very angry, disoriented  . Dilaudid  [Hydromorphone Hcl] Other (See Comments)    VERY AGITATED, HOSTILE  . Flomax [Tamsulosin Hcl] Shortness Of Breath  . Morphine And Related Other (See Comments)    VERY AGITATED, HOSTILE  . Sulfa Antibiotics Shortness Of Breath  . Beta Adrenergic Blockers Other (See Comments)    ? disorientation  . Carvedilol Other (See Comments)    DISORIENTATION   Patient Active Problem List   Diagnosis Date Noted  . Achalasia of esophagus   . Encephalopathy   . CAP (community acquired pneumonia) 09/22/2016  . Fever chills   . SOB (shortness of breath)   . Acute respiratory failure with hypoxemia ([place])   . Acute respiratory failure ([place]) 04/21/2016  . Chronic combined systolic and diastolic congestive heart failure ([place]) 04/21/2016  . Memory loss 04/21/2016  . Allergic rhinitis 04/11/2016  . Chronic systolic CHF (congestive heart failure) ([place]) 10/30/2015  . Mild cognitive impairment 08/18/2015  . Difficulty in swallowing   . Pneumonia 05/12/2015  . AKI (acute kidney injury) ([place])   . Type 2 diabetes mellitus with stage 3 chronic kidney disease, with long-term current use of insulin (  [place])   . Chronic systolic congestive heart failure ([place])   . Chronic kidney disease, stage III (moderate)   . Other specified hypotension   . CKD (chronic kidney disease), stage III   . Sepsis due to pneumonia ([place])   . Aspiration pneumonia due to food (regurgitated) ([place])   . Pneumonitis   . Other emphysema ([place])   . Systolic CHF, acute on chronic ([name])   . AICD (automatic cardioverter/defibrillator) present   . CAD S/P percutaneous coronary angioplasty   . Acute respiratory failure with hypoxia ([place]) 04/03/2015  . Cardiomyopathy, ischemic 04/03/2015  . Elevated troponin 04/03/2015  . Sepsis ([place]) 03/31/2015  . Blood poisoning   . COPD exacerbation ([place])   . HCAP (healthcare-associated pneumonia) and recurrent Aspiration Pneumonia 03/21/2015  . Aspiration into airway 03/21/2015  . Achalasia 03/21/2015  .  Dysphagia   . Aspiration pneumonia ([place])   . Esophageal dysphagia   . Esophageal dysmotilities   . PNA (pneumonia) 02/07/2015  . COPD with acute exacerbation ([place]) 01/14/2015  . Left ventricular apical thrombus 01/14/2015  . Acute on chronic systolic CHF (congestive heart failure) ([place]) 01/14/2015  . ARF (acute renal failure) ([place]) 01/14/2015  . Dysphagia, unspecified(787.20) 10/30/2013  . COPD GOLD IV 10/07/2013  . Influenza with pneumonia 09/11/2013  . Leukocytosis 09/11/2013  . Pneumonia, community acquired 09/10/2013  . Acute encephalopathy 09/10/2013  . Renal failure (ARF), acute on chronic ([name]) 09/10/2013  . Automatic implantable cardioverter-defibrillator in situ 07/06/2012  . CKD (chronic kidney disease) stage 3, GFR 30-59 ml/min 06/08/2012  . GERD (gastroesophageal reflux disease) 09/26/2011  . BPH (benign prostatic hyperplasia) 08/22/2011  . COPD (chronic obstructive pulmonary disease) ([place]) 07/17/2011  . S/P ICD (internal cardiac defibrillator) procedure 04/20/2011  . Coronary artery disease   . Sick sinus syndrome ([place])   . Acute on chronic combined systolic and diastolic congestive heart failure, NYHA class 3 ([place])   . Hyperlipidemia   . PVC's (premature ventricular contractions)    Social History   Social History  . Marital status: Widowed    Spouse name: N/A  . Number of children: 2  . Years of education: GED   Occupational History  . 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 Smoking status: Former Smoker    Packs/day: 1.00    Years: 35.00    Types: Cigarettes    Quit date: 08/01/1992  . Smokeless tobacco: Never Used  . Alcohol use No  . Drug use: No  . Sexual activity: Not Currently   Other Topics Concern  . Not on file   Social History Narrative   Pt lives in [place] alone.  Widowed.   Right-handed.   Rare caffeine use - maybe one cup per month.    Mr. Fread  family history includes Stroke in his father.      Objective:    Vitals:   11/14/16 1450  BP: (!) 102/54    Physical Exam  well-developed elderly white male, carrying portable oxygen but not using, accompanied by his son, blood pressure 102/54, height 5 foot 8, weight 204, BMI 30.5. HEENT; nontraumatic normocephalic EOMI PERRLA sclerae anicteric, Cardiovascular; regular rate and rhythm with S1-S2 he has a defibrillator in the left chest wall, Pulmonary;  Decreased  Breath sounds bilaterally he has rhonchi in the right mid and right lower lung, Abdomen ;soft nontender nondistended bowel sounds are active no palpable  mass or hepatosplenomegaly, Extremities; no clubbing cyanosis or edema skin warm and dry, Neuropsych ;mood and affect appropriate       Assessment & Plan:   #68  83 year old white male with achalasia type II status post pneumatic balloon dilation February 2017 with good response now with recurrent symptoms over the past couple of months, some sensation of food sticking in his chest at times, and now with frequent regurgitation of thick mucus postprandially #2 right lower lobe pneumonia patient just discharged from the hospital earlier today-possibly aspiration related #3 COPD severe-uses O2 when necessary #4 congestive heart failure and ischemic cardiomyopathy EF 20-25% #5 status post defibrillator #6 history of sick sinus syndrome #7 mild dementia #8 insulin-dependent diabetes mellitus  Plan; Very soft to full liquid diet, upright postprandially and elevate head of the bed 45 reviewed with the patient , Discussed with Dr. Mancel Bale proceed with barium swallow initially, and then decide on EGD with repeat balloon dilation versus Botox injections. At [facility]. Patient's son concerned and would like procedure done as soon as possible so that he doesn't have another episode of aspiration and pneumonia in the interim.  Bill Mcvey Genia Harold  PA-C 11/14/2016   Cc: [name], MD

## 2016-11-14 NOTE — Progress Notes (Signed)
OT Cancellation Note  [name] MRN: [id number] DOB: 11-15-1933   Cancelled Treatment:     Orders received and chart reviewed for Occupational Therapy. Noted discharge orders in chart for discharge home today. Pt was educated in role of OT and he currently declines any needs at this time. He stated "I've had that before and I have those grab bars that they wanted me to have and everything" Pt also states that "I have been doing all of this for a long time by myself" Re: ADL's and self care tasks. He states that he will have family assist intermittently after d/c and politely declines OT Eval today. Will sign off acute OT at this time per pt's request.   Almyra Deforest, OTR/L 11/14/2016, 11:50 AM

## 2016-11-16 LAB — CULTURE, BLOOD (ROUTINE X 2)
CULTURE: NO GROWTH
Culture: NO GROWTH
Special Requests: ADEQUATE
Special Requests: ADEQUATE

## 2016-11-17 ENCOUNTER — Other Ambulatory Visit: Payer: Self-pay

## 2016-11-17 ENCOUNTER — Ambulatory Visit (HOSPITAL_COMMUNITY)
Admission: RE | Admit: 2016-11-17 | Discharge: 2016-11-17 | Disposition: A | Discharge status: Home / Self Care | Payer: Medicare Other | Source: Ambulatory Visit | Attending: Physician Assistant | Admitting: Physician Assistant

## 2016-11-17 DIAGNOSIS — K220 Achalasia of cardia: Secondary | ICD-10-CM | POA: Diagnosis not present

## 2016-11-17 DIAGNOSIS — R1310 Dysphagia, unspecified: Secondary | ICD-10-CM | POA: Diagnosis not present

## 2016-11-17 MED ORDER — FUROSEMIDE 20 MG PO TABS: 40.0000 mg | ORAL_TABLET | Freq: Every day | ORAL | 0 refills | Status: DC

## 2016-11-18 ENCOUNTER — Telehealth: Payer: Self-pay

## 2016-11-18 ENCOUNTER — Encounter: Payer: Self-pay | Admitting: Internal Medicine

## 2016-11-18 ENCOUNTER — Ambulatory Visit (INDEPENDENT_AMBULATORY_CARE_PROVIDER_SITE_OTHER): Payer: Medicare Other | Admitting: Internal Medicine

## 2016-11-18 ENCOUNTER — Other Ambulatory Visit: Payer: Self-pay

## 2016-11-18 VITALS — BP 100/58 | HR 66 | Temp 97.4°F | Resp 17 | Ht 68.0 in | Wt 200.8 lb

## 2016-11-18 DIAGNOSIS — N183 Chronic kidney disease, stage 3 (moderate): Secondary | ICD-10-CM

## 2016-11-18 DIAGNOSIS — R1314 Dysphagia, pharyngoesophageal phase: Secondary | ICD-10-CM

## 2016-11-18 DIAGNOSIS — E1122 Type 2 diabetes mellitus with diabetic chronic kidney disease: Secondary | ICD-10-CM

## 2016-11-18 DIAGNOSIS — I255 Ischemic cardiomyopathy: Secondary | ICD-10-CM

## 2016-11-18 DIAGNOSIS — Z794 Long term (current) use of insulin: Secondary | ICD-10-CM | POA: Diagnosis not present

## 2016-11-18 DIAGNOSIS — K220 Achalasia of cardia: Secondary | ICD-10-CM

## 2016-11-18 NOTE — Telephone Encounter (Signed)
-----   Message from Mauri Pole, MD sent at 11/18/2016  8:17 AM EDT ----- Antonio Ray, he will need egd with pneumatic balloon dilation (Please ask endoscopy to have rigiflex 30 mm and 35 mm balloon available, Botox incase I cannot do the pneumatic, and Fluoroscopy). Will need to schedule at [facility] unit with general anaesthesia (intubated), will have to come up with a date but please check the availability at the endo unit first. Will also need Gastrograffin swallow study to check for any leaks post pneumatic dilation once patient recovers from anaesthesia Amy, can you please inform patient and his family the results and If they have questions, I can either call or schedule an office visit to discuss.  Thanks

## 2016-11-18 NOTE — Progress Notes (Signed)
Reviewed and agree with documentation and assessment and plan. K. Veena Nandigam , MD   

## 2016-11-18 NOTE — Progress Notes (Signed)
Pre visit review using our clinic review tool, if applicable. No additional management support is needed unless otherwise documented below in the visit note. 

## 2016-11-18 NOTE — Progress Notes (Signed)
Patient ID: Antonio Ray, male   DOB: Apr 15, 1934, 83 y.o.   MRN: [id number]   HPI: Antonio Ray is a 83 y.o.-year-old male, returning for f/u for DM2, dx in 2010, insulin-dependent since ~2012, uncontrolled, with complications (PN, CKD stage 3-4, CAD, CHF - ICD). He is here with his son who offers part of the history. Last visit 2 mo ago.  Since last visit, he had PNA >> admitted earlier this month.  Last hemoglobin A1c was: Lab Results  Component Value Date   HGBA1C 7.9 (H) 11/11/2016   HGBA1C 9.9 07/19/2016   HGBA1C 10.2 (H) 04/21/2016   He had a lot of steroids for COPD. He had a severe PNA episode in 2016 2/2 aspiration 2/2 achalasia. He had several admissions >> on repeated Solumedrol doses. Since then >> occasional COPD exacerbations >> Solumedrol + prednisone tapers - not in last 3 months.  Pt was on a regimen of: - Basaglar 32 units in am  He is now on:: - Basaglar 30 units in am. - Novolog (10-15 min before a meal): - 5-7 units before a smaller meal - 8-10 units before a larger meal  Pt checks his sugars 3x a day: - am: 118-189, 222 >> 120-151 >> 109-152 - 2h after b'fast: n/c >> 151-246 >> n/c - before lunch: 175-260 >> 100-188, 209 >> 96-162, 187  - 2h after lunch: n/c >> 93, 106-180, 233 >> n/c - before dinner: 144, 159-287 >> 135-191, 188, 216 (granola bar, cookies) >> 90-178 - 2h after dinner: n/c >> 107-176, 211, 240 >> n/c - bedtime: n/c >> 114-187 >> n/c - nighttime: n/c No lows. Lowest sugar was 99 >> 97 Highest sugar was HI >> 287 >> 240  Glucometer: Freestyle Lite  Pt's meals are: - Breakfast: bacon, eggs, hash browns (5-6 units) - Lunch: sandwich or soup - Dinner: varies, meat + veggies - Snacks: 1-2  He walks daily.  - no CKD, last BUN/creatinine:  Lab Results  Component Value Date   BUN 24 (H) 11/14/2016   BUN 32 (H) 11/13/2016   CREATININE 1.59 (H) 11/14/2016   CREATININE 2.05 (H) 11/13/2016   - last set of lipids: Lab Results   Component Value Date   CHOL 136 06/20/2013   HDL 32.00 (L) 06/20/2013   LDLCALC 76 06/20/2013   TRIG 142.0 06/20/2013   CHOLHDL 4 06/20/2013   - last eye exam was in 03/17/2016. Dr. Marica Otter. ? DR. Had cataract Sx in 2016. + macular degeneration. Has a foot ulcer 11/2014 >> healed well (2016). He has DM shoes. R foot more swollen after his fx >> 20 years ago. He has a podiatrist. - no numbness and tingling in his feet.  ROS: Constitutional: no weight gain/loss, no fatigue, no subjective hyperthermia/hypothermia Eyes: no blurry vision, no xerophthalmia ENT: no sore throat, no nodules palpated in throat, no dysphagia/odynophagia, no hoarseness Cardiovascular: no CP/SOB/palpitations/ + R>L leg swelling Respiratory: no cough/SOB/+ wheezing Gastrointestinal: no N/V/D/C Musculoskeletal: no muscle/joint aches Skin: no rashes Neurological: no tremors/numbness/tingling/dizziness  I reviewed pt's medications, allergies, PMH, social hx, family hx, and changes were documented in the history of present illness. Otherwise, unchanged from my initial visit note.  Past Medical History:  Diagnosis Date  . Achalasia   . AICD (automatic cardioverter/defibrillator) present 03/30/2011   Analyze ST study patient  . Anginal pain ([place])   . BPH (benign prostatic hyperplasia)   . CHF (congestive heart failure) ([place])   . Chronic systolic dysfunction of left  ventricle    EF 30-35%, CLASS II - III SYMPTOMS; intolerant to Coreg  . CKD (chronic kidney disease) stage 3, GFR 30-59 ml/min 06/08/2012  . COPD (chronic obstructive pulmonary disease) ([place])   . Coronary artery disease    History of remote anterior MI with PCI to LAD in 2006; most recent cath 2007, no intervention required  . DOE (dyspnea on exertion)    with heavy exertion  . Dysrhythmia   . Esophageal dysmotility   . GERD (gastroesophageal reflux disease)   . Head injury, closed, with concussion 2000ish  . Heart murmur    hx  . HOH (hard  of hearing)   . Hyperlipidemia   . Memory loss    improved  . Myocardial infarction ([place]) 1994   ANTERIOR  . Pneumonia "several times"   aspiration pna with at least 3 admits for this 2016.   Marland Kitchen PVC's (premature ventricular contractions)   . Renal failure   . Skin cancer "several"   "forearms; head"  . Type II diabetes mellitus ([place])    Past Surgical History:  Procedure Laterality Date  . BALLOON DILATION N/A 09/09/2015   Procedure: BALLOON DILATION;  Surgeon: Mauri Pole, MD;  Location: [facility];  Service: Endoscopy;  Laterality: N/A;  rigiflex achalasia balloon dilators  . BOTOX INJECTION N/A 04/08/2015   Procedure: BOTOX INJECTION;  Surgeon: Milus Banister, MD;  Location: [facility];  Service: Endoscopy;  Laterality: N/A;  . CARDIAC CATHETERIZATION  01/11/2006   DEMONSTRATES AKINESIA OF THE DISTAL ANTERIOR WALL, DISTAL INFERIOR WALL AND AKINESIA OF THE APEX. THE BASAL SEGMENTS CONTRACT WELL AND OVERALL EF 35%  . CATARACT EXTRACTION W/ INTRAOCULAR LENS  IMPLANT, BILATERAL  08/2014-09/2014  . CHOLECYSTECTOMY  2/11  . CORONARY ANGIOPLASTY  1994   TO THE LAD  . ESOPHAGEAL MANOMETRY N/A 03/16/2015   Procedure: ESOPHAGEAL MANOMETRY (EM);  Surgeon: Jerene Bears, MD;  Location: [facility];  Service: Gastroenterology;  Laterality: N/A;  . ESOPHAGOGASTRODUODENOSCOPY N/A 04/08/2015   Procedure: ESOPHAGOGASTRODUODENOSCOPY (EGD);  Surgeon: Milus Banister, MD;  Location: [facility];  Service: Endoscopy;  Laterality: N/A;  . ESOPHAGOGASTRODUODENOSCOPY (EGD) WITH PROPOFOL N/A 09/09/2015   Procedure: ESOPHAGOGASTRODUODENOSCOPY (EGD) WITH PROPOFOL;  Surgeon: Mauri Pole, MD;  Location: [facility];  Service: Endoscopy;  Laterality: N/A;  pt. is to have gastrografin xray post recovery-do not discharge until results are back  . FOOT FRACTURE SURGERY Right 1980's  . INSERT / REPLACE / REMOVE PACEMAKER    . SKIN CANCER EXCISION  "several"   "forearms, head"   Social History   Social  History  . Marital status: Widowed    Spouse name: N/A  . Number of children: 2  . Years of education: GED   Occupational History  . 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 Smoking status: Former Smoker    Packs/day: 1.00    Years: 35.00    Types: Cigarettes    Quit date: 08/01/1992  . Smokeless tobacco: Never Used  . Alcohol use No  . Drug use: No  . Sexual activity: Not Currently   Other Topics Concern  . Not on file   Social History Narrative   Pt lives in [place] alone.  Widowed.   Right-handed.   Rare caffeine use - maybe one cup per month.   Current Outpatient Prescriptions on File Prior to Visit  Medication Sig  Dispense Refill  . albuterol (PROVENTIL HFA;VENTOLIN HFA) 108 (90 Base) MCG/ACT inhaler Inhale 2 puffs into the lungs every 6 (six) hours as needed for wheezing or shortness of breath. 1 Inhaler 2  . aspirin 81 MG EC tablet Take 81 mg by mouth daily.      . Cholecalciferol (VITAMIN D) 2000 units tablet Take 2,000 Units by mouth daily.     Marland Kitchen donepezil (ARICEPT) 10 MG tablet Take 1 tablet (10 mg total) by mouth every morning. 90 tablet 3  . fenofibrate (TRICOR) 145 MG tablet TAKE 1 TABLET BY MOUTH EVERY DAY 90 tablet 0  . furosemide (LASIX) 20 MG tablet Take 2 tablets (40 mg total) by mouth daily. 90 tablet 0  . insulin aspart (NOVOLOG FLEXPEN) 100 UNIT/ML FlexPen Inject 5-6 Units into the skin See admin instructions. Inject 6 units SQ with breakfast, 5 units SQ with lunch and 5 units SQ with dinner (Patient taking differently: Inject 5-7 Units into the skin See admin instructions. Sliding scaIe-depending on meal nject 6 units SQ with breakfast, 5 units SQ with lunch and 5 units SQ with dinner)    . Insulin Glargine (BASAGLAR KWIKPEN) 100 UNIT/ML SOPN Inject 0.3 mLs (30 Units total) into the skin every morning. 10 pen 0  . loratadine (CLARITIN) 10 MG tablet Take 1 tablet (10 mg total) by  mouth daily. 30 tablet 1  . memantine (NAMENDA) 10 MG tablet Take one tablet twice daily. Please call [phone] for an appointment. (Patient taking differently: Take 10 mg by mouth 2 (two) times daily. ) 180 tablet 0  . Multiple Vitamins-Minerals (PRESERVISION AREDS 2) CAPS Take 1 capsule by mouth 2 (two) times daily.    . nitroGLYCERIN (NITRODUR - DOSED IN MG/24 HR) 0.2 mg/hr patch Place 1 patch (0.2 mg total) onto the skin daily. (Patient taking differently: Place 0.2 mg onto the skin daily. Alternates patch placement with odd/even days. Even days on left side, odd days on right side.) 90 patch 3  . pantoprazole (PROTONIX) 20 MG tablet Take 1 tablet (20 mg total) by mouth daily. 30 tablet 11  . pantoprazole (PROTONIX) 20 MG tablet TAKE 1 TABLET BY MOUTH DAILY 90 tablet 0  . pravastatin (PRAVACHOL) 40 MG tablet Take 1 tablet (40 mg total) by mouth daily. (Patient taking differently: Take 40 mg by mouth at bedtime. ) 90 tablet 1  . SPIRIVA HANDIHALER 18 MCG inhalation capsule INHALE CONTENTS OF 1 CAPSULE VIA HANDIHALER ONCE DAILY 30 capsule 4  . SYMBICORT 160-4.5 MCG/ACT inhaler INHALE 2 PUFFS BY MOUTH TWICE DAILY 10.2 g 5  . amoxicillin-clavulanate (AUGMENTIN) 500-125 MG tablet Take 1 tablet (500 mg total) by mouth every 12 (twelve) hours. (Patient not taking: Reported on 11/18/2016) 8 tablet 0   No current facility-administered medications on file prior to visit.    Allergies  Allergen Reactions  . Codeine Other (See Comments)    Gets very angry, disoriented  . Dilaudid [Hydromorphone Hcl] Other (See Comments)    VERY AGITATED, HOSTILE  . Flomax [Tamsulosin Hcl] Shortness Of Breath  . Morphine And Related Other (See Comments)    VERY AGITATED, HOSTILE  . Sulfa Antibiotics Shortness Of Breath  . Beta Adrenergic Blockers Other (See Comments)    ? disorientation  . Carvedilol Other (See Comments)    DISORIENTATION   Family History  Problem Relation Age of Onset  . Stroke Father     Mother  history unknown - never met her  . Colon cancer Neg Hx  PE: BP (!) 100/58 (BP Location: Right Arm, Patient Position: Sitting, Cuff Size: Normal)   Pulse 66   Temp 97.4 F (36.3 C) (Oral)   Resp 17   Ht _0  (1.727 m)   Wt 200 lb 12.8 oz (91.1 kg)   SpO2 95%   BMI 30.53 kg/m  Wt Readings from Last 3 Encounters:  11/18/16 200 lb 12.8 oz (91.1 kg)  11/14/16 204 lb (92.5 kg)  11/11/16 197 lb 1.6 oz (89.4 kg)   Constitutional: overweight, in NAD Eyes: PERRLA, EOMI, no exophthalmos ENT: moist mucous membranes, no thyromegaly, no cervical lymphadenopathy Cardiovascular: RRR, No MRG, R periankle swelling - chronic Respiratory: CTA L lung; R 1/2 lower lung crackles Gastrointestinal: abdomen soft, NT, ND, BS+ Musculoskeletal: no deformities, strength intact in all 4 Skin: moist, warm, no rashes Neurological: no tremor with outstretched hands, DTR normal in all 4   ASSESSMENT: 1. DM2, insulin-dependent, uncontrolled, with complications - PN - CKD stage 3-4 - CAD, CHF - ICD  PLAN:  1. Patient with long-standing, uncontrolled diabetes, worse after prednisone courses for his COPD exacerbations, but now with mch better control after adding Novolog to his basal insulin. He is still using low doses but is a little more bold in increasing the doses if he eats a larger meal. As sugars are still above target >> I advised him to increase Novolog by 1 unit per meal. - I again advise them to increase the NovoLog if he starts back on steroids.  - last HbA1c was  7.9% at last check 7 days ago >> much better! - I suggested to:  Patient Instructions  Please continue: - Basaglar 30 units in am.  Please increase: - Novolog (10-15 min before a meal): - 6-7 units before a smaller meal - 8-10 units before a larger meal  If you need to start steroid again, you will need to increase Novolog by at least 50%.  Please return in 3 months with your sugar log.   - check sugars at different times  of the day - check 3  times a day, rotating checks >> does a great job with this - he is UTD with his eye exams - Return to clinic in 3 mo with sugar log   Philemon Kingdom, MD PhD [facility] Endocrinology

## 2016-11-18 NOTE — Patient Instructions (Addendum)
Please continue: - Basaglar 30 units in am.  Please increase: - Novolog (10-15 min before a meal): - 6-7 units before a smaller meal - 8-10 units before a larger meal  If you need to start steroid again, you will need to increase Novolog by at least 50%.  Please return in 3 months with your sugar log.

## 2016-11-18 NOTE — Telephone Encounter (Signed)
Thursday afternoon 12/01/16 start time 1:30. Size 35 mm balloon must be ordered.

## 2016-11-22 ENCOUNTER — Telehealth: Payer: Self-pay | Admitting: Gastroenterology

## 2016-11-22 ENCOUNTER — Other Ambulatory Visit: Payer: Self-pay

## 2016-11-22 ENCOUNTER — Other Ambulatory Visit: Payer: Self-pay | Admitting: *Deleted

## 2016-11-22 ENCOUNTER — Telehealth: Payer: Self-pay | Admitting: Cardiology

## 2016-11-22 DIAGNOSIS — C4442 Squamous cell carcinoma of skin of scalp and neck: Secondary | ICD-10-CM | POA: Diagnosis not present

## 2016-11-22 MED ORDER — FUROSEMIDE 20 MG PO TABS: 40.0000 mg | ORAL_TABLET | Freq: Every day | ORAL | 3 refills | Status: DC

## 2016-11-22 NOTE — Telephone Encounter (Signed)
Rx request sent to pharmacy.  

## 2016-11-22 NOTE — Telephone Encounter (Signed)
Correction on the date. The EGD with dilation is 12/08/16 at [facility].

## 2016-11-22 NOTE — Telephone Encounter (Signed)
Correction on the EGD date. It is actually 12/08/16. Please advise on the results. The son is calling.

## 2016-11-22 NOTE — Telephone Encounter (Signed)
Please advise. I have him scheduled for the EGD with dilation and all the extras at [facility]. His son can see the appointments because he has an active My Chart account. EGD is 12/01/16

## 2016-11-22 NOTE — Telephone Encounter (Signed)
Scheduled for 12/06/16 when family and patient are able to come in together and discuss the procedure.

## 2016-11-22 NOTE — Telephone Encounter (Signed)
Called patient's son and discussed the results of barium swallow and plan for EGD with possible pneumatic dilation. Please schedule office visit next week to discuss the procedure in detail and the benefits and risks

## 2016-11-23 ENCOUNTER — Other Ambulatory Visit: Payer: Self-pay

## 2016-11-24 ENCOUNTER — Ambulatory Visit (INDEPENDENT_AMBULATORY_CARE_PROVIDER_SITE_OTHER)
Admission: RE | Admit: 2016-11-24 | Discharge: 2016-11-24 | Disposition: A | Discharge status: Home / Self Care | Payer: Medicare Other | Source: Ambulatory Visit | Attending: Adult Health | Admitting: Adult Health

## 2016-11-24 ENCOUNTER — Ambulatory Visit (INDEPENDENT_AMBULATORY_CARE_PROVIDER_SITE_OTHER): Payer: Medicare Other | Admitting: Adult Health

## 2016-11-24 ENCOUNTER — Encounter: Payer: Self-pay | Admitting: *Deleted

## 2016-11-24 VITALS — BP 126/78 | HR 66 | Ht 68.0 in | Wt 194.6 lb

## 2016-11-24 DIAGNOSIS — J449 Chronic obstructive pulmonary disease, unspecified: Secondary | ICD-10-CM

## 2016-11-24 DIAGNOSIS — R05 Cough: Secondary | ICD-10-CM | POA: Diagnosis not present

## 2016-11-24 DIAGNOSIS — I255 Ischemic cardiomyopathy: Secondary | ICD-10-CM

## 2016-11-24 DIAGNOSIS — I5022 Chronic systolic (congestive) heart failure: Secondary | ICD-10-CM

## 2016-11-24 DIAGNOSIS — R1310 Dysphagia, unspecified: Secondary | ICD-10-CM | POA: Diagnosis not present

## 2016-11-24 DIAGNOSIS — R1319 Other dysphagia: Secondary | ICD-10-CM

## 2016-11-24 DIAGNOSIS — J189 Pneumonia, unspecified organism: Secondary | ICD-10-CM

## 2016-11-24 NOTE — Assessment & Plan Note (Signed)
Doing well without flare   Plan  Patient Instructions  Continue on Symbicort and Spiriva .  Continue on current regimen.  Aspiration precautions .  Follow up with GI as planned.  Follow up with  Dr. Lake Bells in 3 months and As needed   Please contact office for sooner follow up if symptoms do not improve or worsen or seek emergency care

## 2016-11-24 NOTE — Assessment & Plan Note (Signed)
RLL PNA -suspected aspiration now clinically imprvoed  CXR reviewed independently w/ clearance of PNA  Cont w/ aspiration precaustions.

## 2016-11-24 NOTE — Assessment & Plan Note (Signed)
Appears euvolemic - w/ no overt fluid overload on exam .  Cont follow up with cards.

## 2016-11-24 NOTE — Telephone Encounter (Signed)
Protonix approved through cover my meds

## 2016-11-24 NOTE — Telephone Encounter (Signed)
Called son to inform his dads protonix was approved

## 2016-11-24 NOTE — Patient Instructions (Signed)
Continue on Symbicort and Spiriva .  Continue on current regimen.  Aspiration precautions .  Follow up with GI as planned.  Follow up with  Dr. Lake Bells in 3 months and As needed   Please contact office for sooner follow up if symptoms do not improve or worsen or seek emergency care

## 2016-11-24 NOTE — Assessment & Plan Note (Signed)
Cont aspiration precautions  follow up with GI as planned

## 2016-11-24 NOTE — Progress Notes (Signed)
@Patient  ID: Antonio Ray, male    DOB: [birth year], 83 y.o.   MRN: [id number]  Chief Complaint  Patient presents with  . Follow-up    COPD     Referring provider: [name], MD  HPI: 83 yo male followed for severe COPD , recurrent PNA.Marland Kitchen  Hx of Achalasia s/p botox injections in distal esophagus in 2016.  Has CAD , CKD  TEST  PFT 2015 FEV1 29%.   11/24/2016 Post hospital follow up : COPD [facility] Patient returns for a post hospital follow-up. Patient was admitted 2 weeks ago for aspiration pneumonia. He was treated with IV antibiotics and discharged on Augmentin. He was seen by speech therapy and continued on aspiration precautions. Discharged on oxygen 2 L.. Patient remains on Symbicort and Spiriva. Chest x-ray today shows interval clearing of the right lower lobe infiltrate.  Since discharge. Patient is feeling better with decreased cough and congestion. He denies any fever, hemoptysis, nausea, vomiting or diarrhea. Has follow up with GI in 2 weeks.    Allergies  Allergen Reactions  . Codeine Other (See Comments)    Gets very angry, disoriented  . Dilaudid [Hydromorphone Hcl] Other (See Comments)    VERY AGITATED, HOSTILE  . Flomax [Tamsulosin Hcl] Shortness Of Breath  . Morphine And Related Other (See Comments)    VERY AGITATED, HOSTILE  . Sulfa Antibiotics Shortness Of Breath  . Beta Adrenergic Blockers Other (See Comments)    ? disorientation  . Carvedilol Other (See Comments)    DISORIENTATION    Immunization History  Administered Date(s) Administered  . Influenza Split 05/09/2013, 06/16/2015  . Influenza, High Dose Seasonal PF 06/04/2014  . Influenza,inj,Quad PF,36+ Mos 04/11/2016  . Pneumococcal Polysaccharide-23 12/22/2008  . Tdap 12/22/2008    Past Medical History:  Diagnosis Date  . Achalasia   . AICD (automatic cardioverter/defibrillator) present 03/30/2011   Analyze ST study patient  . Anginal pain ([place])   . BPH (benign prostatic hyperplasia)    . CHF (congestive heart failure) ([place])   . Chronic systolic dysfunction of left ventricle    EF 30-35%, CLASS II - III SYMPTOMS; intolerant to Coreg  . CKD (chronic kidney disease) stage 3, GFR 30-59 ml/min 06/08/2012  . COPD (chronic obstructive pulmonary disease) ([place])   . Coronary artery disease    History of remote anterior MI with PCI to LAD in 2006; most recent cath 2007, no intervention required  . DOE (dyspnea on exertion)    with heavy exertion  . Dysrhythmia   . Esophageal dysmotility   . GERD (gastroesophageal reflux disease)   . GERD (gastroesophageal reflux disease)   . Head injury, closed, with concussion 2000ish  . Heart murmur    hx  . HOH (hard of hearing)   . Hyperlipidemia   . Memory loss    improved  . Myocardial infarction ([place]) 1994   ANTERIOR  . Pneumonia "several times"   aspiration pna with at least 3 admits for this 2016.   Marland Kitchen PVC's (premature ventricular contractions)   . Renal failure   . Skin cancer "several"   "forearms; head"  . Type II diabetes mellitus ([name])     Tobacco History: History  Smoking Status  . Former Smoker  . Packs/day: 1.00  . Years: 35.00  . Types: Cigarettes  . Quit date: 08/01/1992  Smokeless Tobacco  . Never Used   Counseling given: Not Answered   Outpatient Encounter Prescriptions as of 11/24/2016  Medication Sig  . albuterol (  PROVENTIL HFA;VENTOLIN HFA) 108 (90 Base) MCG/ACT inhaler Inhale 2 puffs into the lungs every 6 (six) hours as needed for wheezing or shortness of breath.  Marland Kitchen aspirin 81 MG EC tablet Take 81 mg by mouth daily.    . Cholecalciferol (VITAMIN D) 2000 units tablet Take 2,000 Units by mouth daily.   Marland Kitchen donepezil (ARICEPT) 10 MG tablet Take 1 tablet (10 mg total) by mouth every morning.  . fenofibrate (TRICOR) 145 MG tablet TAKE 1 TABLET BY MOUTH EVERY DAY  . furosemide (LASIX) 20 MG tablet Take 2 tablets (40 mg total) by mouth daily.  . insulin aspart (NOVOLOG FLEXPEN) 100 UNIT/ML FlexPen Inject 5-6  Units into the skin See admin instructions. Inject 6 units SQ with breakfast, 5 units SQ with lunch and 5 units SQ with dinner (Patient taking differently: Inject 5-7 Units into the skin See admin instructions. Sliding scaIe-depending on meal nject 6 units SQ with breakfast, 5 units SQ with lunch and 5 units SQ with dinner)  . Insulin Glargine (BASAGLAR KWIKPEN) 100 UNIT/ML SOPN Inject 0.3 mLs (30 Units total) into the skin every morning.  . loratadine (CLARITIN) 10 MG tablet Take 1 tablet (10 mg total) by mouth daily.  . memantine (NAMENDA) 10 MG tablet Take one tablet twice daily. Please call [phone] for an appointment. (Patient taking differently: Take 10 mg by mouth 2 (two) times daily. )  . Multiple Vitamins-Minerals (PRESERVISION AREDS 2) CAPS Take 1 capsule by mouth 2 (two) times daily.  . nitroGLYCERIN (NITRODUR - DOSED IN MG/24 HR) 0.2 mg/hr patch Place 1 patch (0.2 mg total) onto the skin daily. (Patient taking differently: Place 0.2 mg onto the skin daily. Alternates patch placement with odd/even days. Even days on left side, odd days on right side.)  . pantoprazole (PROTONIX) 20 MG tablet Take 1 tablet (20 mg total) by mouth daily.  . pantoprazole (PROTONIX) 20 MG tablet TAKE 1 TABLET BY MOUTH DAILY  . pravastatin (PRAVACHOL) 40 MG tablet Take 1 tablet (40 mg total) by mouth daily. (Patient taking differently: Take 40 mg by mouth at bedtime. )  . SPIRIVA HANDIHALER 18 MCG inhalation capsule INHALE CONTENTS OF 1 CAPSULE VIA HANDIHALER ONCE DAILY  . SYMBICORT 160-4.5 MCG/ACT inhaler INHALE 2 PUFFS BY MOUTH TWICE DAILY   No facility-administered encounter medications on file as of 11/24/2016.      Review of Systems  Constitutional:   No  weight loss, night sweats,  Fevers, chills, fatigue, or  lassitude.  HEENT:   No headaches,  Difficulty swallowing,  Tooth/dental problems, or  Sore throat,                No sneezing, itching, ear ache, nasal congestion, post nasal drip,   CV:  No  chest pain,  Orthopnea, PND, swelling in lower extremities, anasarca, dizziness, palpitations, syncope.   GI  No heartburn, indigestion, abdominal pain, nausea, vomiting, diarrhea, change in bowel habits, loss of appetite, bloody stools.   Resp: No shortness of breath with exertion or at rest.  No excess mucus, no productive cough,  No non-productive cough,  No coughing up of blood.  No change in color of mucus.  No wheezing.  No chest wall deformity  Skin: no rash or lesions.  GU: no dysuria, change in color of urine, no urgency or frequency.  No flank pain, no hematuria   MS:  No joint pain or swelling.  No decreased range of motion.  No back pain.    Physical Exam  BP 126/78 (BP Location: Left Arm, Cuff Size: Normal)   Pulse 66   Ht 5\' 8"  (1.727 m)   Wt 194 lb 9.6 oz (88.3 kg)   SpO2 95%   BMI 29.59 kg/m   GEN: A/Ox3; pleasant , NAD, elderly    HEENT:  [place]/AT,  EACs-clear, TMs-wnl, NOSE-clear, THROAT-clear, no lesions, no postnasal drip or exudate noted.   NECK:  Supple w/ fair ROM; no JVD; normal carotid impulses w/o bruits; no thyromegaly or nodules palpated; no lymphadenopathy.    RESP  Decreased BS in bases  w/o, wheezes/ rales/ or rhonchi. no accessory muscle use, no dullness to percussion  CARD:  RRR, no m/r/g, tr on right peripheral edema, pulses intact, no cyanosis or clubbing.  GI:   Soft & nt; nml bowel sounds; no organomegaly or masses detected.   Musco: Warm bil, no deformities or joint swelling noted.   Neuro: alert, no focal deficits noted.    Skin: Warm, no lesions or rashes  Imaging: Dg Chest 2 View  Result Date: 11/24/2016 CLINICAL DATA:  Cough and congestion.  Recent pneumonia EXAM: CHEST  2 VIEW COMPARISON:  November 11, 2016 FINDINGS: There has been interval clearing of infiltrate from the right lower lobe. There is currently no edema or consolidation. Lungs are hyperexpanded. Heart size and pulmonary vascularity are normal. No adenopathy. Pacemaker  leads are attached the right atrium and right ventricle. No pneumothorax. No bone lesions. There is aortic atherosclerosis. IMPRESSION: Lungs hyperexpanded without edema or consolidation. Aortic atherosclerosis. Pacemaker leads attached to right atrium and right ventricle. Electronically Signed   By: Lowella Grip III M.D.   On: 11/24/2016 14:35   Dg Esophagus  Result Date: 11/17/2016 CLINICAL DATA:  Achalasia. Worsening dysphagia with food sticking in chest. Previous esophageal dilatation. EXAM: ESOPHOGRAM / BARIUM SWALLOW / BARIUM TABLET STUDY TECHNIQUE: Combined double contrast and single contrast examination performed using effervescent crystals, thick barium liquid, and thin barium liquid. The patient was observed with fluoroscopy swallowing a 13 mm barium sulphate tablet. FLUOROSCOPY TIME:  Fluoroscopy Time: 1 minutes 54 seconds ; low dose pulsed fluoroscopy Radiation Exposure Index (if provided by the fluoroscopic device): 26.9 mGy Number of Acquired Spot Images: 0 COMPARISON:  None. FINDINGS: No evidence of vestibular penetration or aspiration during swallowing. Mild diffuse esophageal dilatation is seen. Lack of primary peristalsis is seen with tertiary contractions. Smooth narrowing is seen at the gastroesophageal junction with "beak sign" . This prevents passage of a 13 mm barium tablet. No evidence of hiatal hernia or gastroesophageal reflux. IMPRESSION: Findings consistent with achalasia. Smooth narrowing/"beak sign" at the gastroesophageal junction, which prevents passage of a 13 mm barium tablet. Electronically Signed   By: Earle Gell M.D.   On: 11/17/2016 12:00   Dg Chest Port 1 View  Result Date: 11/11/2016 CLINICAL DATA:  Fever and altered mental status today. EXAM: PORTABLE CHEST 1 VIEW COMPARISON:  PA and lateral chest 09/22/2016 and 04/22/2016. FINDINGS: Right basilar airspace disease identified. The left lung is clear. The chest is hyperexpanded. Heart size is normal. No  pneumothorax or pleural fluid. Aortic atherosclerosis is identified. Pacing device is noted. IMPRESSION: Right lower lobe airspace disease most consistent with pneumonia. Recommend followup to clearing. Emphysema. Atherosclerosis. Electronically Signed   By: Inge Rise M.D.   On: 11/11/2016 09:25     Assessment & Plan:   HCAP (healthcare-associated pneumonia) and recurrent Aspiration Pneumonia RLL PNA -suspected aspiration now clinically imprvoed  CXR reviewed independently w/ clearance of PNA  Cont  w/ aspiration precaustions.    COPD GOLD IV Doing well without flare   Plan  Patient Instructions  Continue on Symbicort and Spiriva .  Continue on current regimen.  Aspiration precautions .  Follow up with GI as planned.  Follow up with  Dr. [place] [facility] in 3 months and As needed   Please contact office for sooner follow up if symptoms do not improve or worsen or seek emergency care      Chronic systolic congestive heart failure ([place]) Appears euvolemic - w/ no overt fluid overload on exam .  Cont follow up with cards.   Esophageal dysphagia Cont aspiration precautions  follow up with GI as planned      Rexene Edison, NP 11/24/2016

## 2016-12-01 ENCOUNTER — Other Ambulatory Visit: Payer: Self-pay | Admitting: Internal Medicine

## 2016-12-01 ENCOUNTER — Ambulatory Visit: Payer: Medicare Other | Admitting: Gastroenterology

## 2016-12-05 NOTE — Progress Notes (Signed)
Reviewed, agree 

## 2016-12-06 ENCOUNTER — Encounter: Payer: Self-pay | Admitting: Gastroenterology

## 2016-12-06 ENCOUNTER — Ambulatory Visit (INDEPENDENT_AMBULATORY_CARE_PROVIDER_SITE_OTHER): Payer: Medicare Other | Admitting: Gastroenterology

## 2016-12-06 VITALS — BP 104/52 | HR 60 | Ht 68.0 in | Wt 193.0 lb

## 2016-12-06 DIAGNOSIS — I255 Ischemic cardiomyopathy: Secondary | ICD-10-CM

## 2016-12-06 DIAGNOSIS — R1310 Dysphagia, unspecified: Secondary | ICD-10-CM

## 2016-12-06 DIAGNOSIS — K220 Achalasia of cardia: Secondary | ICD-10-CM | POA: Diagnosis not present

## 2016-12-06 DIAGNOSIS — J690 Pneumonitis due to inhalation of food and vomit: Secondary | ICD-10-CM | POA: Diagnosis not present

## 2016-12-06 NOTE — Patient Instructions (Signed)
You have been scheduled for a modified barium swallow on _____ at ____. Please arrive 15 minutes prior to your test for registration. You will go to ______ Radiology (1st Floor) for your appointment. Please refrain from eating or drinking anything 4 hours prior to your test. Should you need to cancel or reschedule your appointment, please contact [phone] [facility]) or [phone] [facility]). _____________________________________________________________________ A Modified Barium Swallow Study, or MBS, is a special x-ray that is taken to check swallowing skills. It is carried out by a Stage manager and a Psychologist, clinical (SLP). During this test, yourmouth, throat, and esophagus, a muscular tube which connects your mouth to your stomach, is checked. The test will help you, your doctor, and the SLP plan what types of foods and liquids are easier for you to swallow. The SLP will also identify positions and ways to help you swallow more easily and safely. What will happen during an MBS? You will be taken to an x-ray room and seated comfortably. You will be asked to swallow small amounts of food and liquid mixed with barium. Barium is a liquid or paste that allows images of your mouth, throat and esophagus to be seen on x-ray. The x-ray captures moving images of the food you are swallowing as it travels from your mouth through your throat and into your esophagus. This test helps identify whether food or liquid is entering your lungs (aspiration). The test also shows which part of your mouth or throat lacks strength or coordination to move the food or liquid in the right direction. This test typically takes 30 minutes to 1 hour to complete. _______________________________________________________________________

## 2016-12-06 NOTE — Progress Notes (Signed)
Antonio Ray    [id number]    07/02/1934  Primary Care Physician:Briscoe, Jannifer Rodney, MD  Referring Physician: [name], MD [address] [place], [place] [address]  Chief complaint:  Achalasia HPI: 83 year old male with history of CAD, CHF status post ICD, severe COPD and achalasia here for follow-up visit along with his son. Patient has been doing well status post Botox injection at ileus in September 2016 and pneumatic dilation in February 2017 with no dysphagia or aspiration until recent hospitalization with aspiration pneumonia. The day before he was admitted with aspiration pneumonia, he coughed while eating spicy bowl of soup and he felt it went down the wrong way in his throat. He denies having difficulty swallowing or regurgitation. He has modified his diet and stays upright after meals for at least 3-4 hours. Weight has been stable He does have occasional episodes where he feels strangled especially with water, saliva and liquids. Barium esophagogram showed flow of barium through EG junction with dilated esophagus. Outpatient Encounter Prescriptions as of 12/06/2016  Medication Sig  . albuterol (PROVENTIL HFA;VENTOLIN HFA) 108 (90 Base) MCG/ACT inhaler Inhale 2 puffs into the lungs every 6 (six) hours as needed for wheezing or shortness of breath.  Marland Kitchen aspirin 81 MG EC tablet Take 81 mg by mouth daily.    . Cholecalciferol (VITAMIN D) 2000 units tablet Take 2,000 Units by mouth daily.   Marland Kitchen donepezil (ARICEPT) 10 MG tablet Take 1 tablet (10 mg total) by mouth every morning.  . fenofibrate (TRICOR) 145 MG tablet TAKE 1 TABLET BY MOUTH EVERY DAY  . furosemide (LASIX) 20 MG tablet Take 2 tablets (40 mg total) by mouth daily.  . insulin aspart (NOVOLOG FLEXPEN) 100 UNIT/ML FlexPen Inject 5-6 Units into the skin See admin instructions. Inject 6 units SQ with breakfast, 5 units SQ with lunch and 5 units SQ with dinner (Patient taking differently: Inject 5-7  Units into the skin See admin instructions. Sliding scaIe-depending on meal nject 6 units SQ with breakfast, 5 units SQ with lunch and 5 units SQ with dinner)  . Insulin Glargine (BASAGLAR KWIKPEN) 100 UNIT/ML SOPN INJECT 0.3 ML( 30 UNITS) UNDER THE SKIN EVERY MORNING  . loratadine (CLARITIN) 10 MG tablet Take 1 tablet (10 mg total) by mouth daily.  . memantine (NAMENDA) 10 MG tablet Take one tablet twice daily. Please call [phone] for an appointment. (Patient taking differently: Take 10 mg by mouth 2 (two) times daily. )  . Multiple Vitamins-Minerals (PRESERVISION AREDS 2) CAPS Take 1 capsule by mouth 2 (two) times daily.  . nitroGLYCERIN (NITRODUR - DOSED IN MG/24 HR) 0.2 mg/hr patch Place 1 patch (0.2 mg total) onto the skin daily. (Patient taking differently: Place 0.2 mg onto the skin daily. Alternates patch placement with odd/even days. Even days on left side, odd days on right side.)  . pantoprazole (PROTONIX) 20 MG tablet TAKE 1 TABLET BY MOUTH DAILY  . pravastatin (PRAVACHOL) 40 MG tablet Take 1 tablet (40 mg total) by mouth daily. (Patient taking differently: Take 40 mg by mouth at bedtime. )  . SPIRIVA HANDIHALER 18 MCG inhalation capsule INHALE CONTENTS OF 1 CAPSULE VIA HANDIHALER ONCE DAILY  . SYMBICORT 160-4.5 MCG/ACT inhaler INHALE 2 PUFFS BY MOUTH TWICE DAILY  . [DISCONTINUED] pantoprazole (PROTONIX) 20 MG tablet Take 1 tablet (20 mg total) by mouth daily.   No facility-administered encounter medications on file as of 12/06/2016.  Allergies as of 12/06/2016 - Review Complete 12/06/2016  Allergen Reaction Noted  . Codeine Other (See Comments)   . Dilaudid [hydromorphone hcl] Other (See Comments) 09/10/2013  . Flomax [tamsulosin hcl] Shortness Of Breath 05/19/2012  . Morphine and related Other (See Comments) 05/20/2011  . Sulfa antibiotics Shortness Of Breath 05/23/2012  . Beta adrenergic blockers Other (See Comments) 03/31/2015  . Carvedilol Other (See Comments) 04/18/2011     Past Medical History:  Diagnosis Date  . Achalasia   . AICD (automatic cardioverter/defibrillator) present 03/30/2011   Analyze ST study patient  . Anginal pain ([place])   . BPH (benign prostatic hyperplasia)   . CHF (congestive heart failure) ([place])   . Chronic systolic dysfunction of left ventricle    EF 30-35%, CLASS II - III SYMPTOMS; intolerant to Coreg  . CKD (chronic kidney disease) stage 3, GFR 30-59 ml/min 06/08/2012  . COPD (chronic obstructive pulmonary disease) ([place])   . Coronary artery disease    History of remote anterior MI with PCI to LAD in 2006; most recent cath 2007, no intervention required  . DOE (dyspnea on exertion)    with heavy exertion  . Dysrhythmia   . Esophageal dysmotility   . GERD (gastroesophageal reflux disease)   . GERD (gastroesophageal reflux disease)   . Head injury, closed, with concussion 2000ish  . Heart murmur    hx  . HOH (hard of hearing)   . Hyperlipidemia   . Memory loss    improved  . Myocardial infarction ([place]) 1994   ANTERIOR  . Pneumonia "several times"   aspiration pna with at least 3 admits for this 2016.   Marland Kitchen PVC's (premature ventricular contractions)   . Renal failure   . Skin cancer "several"   "forearms; head"  . Type II diabetes mellitus ([place])     Past Surgical History:  Procedure Laterality Date  . BALLOON DILATION N/A 09/09/2015   Procedure: BALLOON DILATION;  Surgeon: Mauri Pole, MD;  Location: [facility];  Service: Endoscopy;  Laterality: N/A;  rigiflex achalasia balloon dilators  . BOTOX INJECTION N/A 04/08/2015   Procedure: BOTOX INJECTION;  Surgeon: Milus Banister, MD;  Location: [facility];  Service: Endoscopy;  Laterality: N/A;  . CARDIAC CATHETERIZATION  01/11/2006   DEMONSTRATES AKINESIA OF THE DISTAL ANTERIOR WALL, DISTAL INFERIOR WALL AND AKINESIA OF THE APEX. THE BASAL SEGMENTS CONTRACT WELL AND OVERALL EF 35%  . CATARACT EXTRACTION W/ INTRAOCULAR LENS  IMPLANT, BILATERAL  08/2014-09/2014  .  CHOLECYSTECTOMY  2/11  . CORONARY ANGIOPLASTY  1994   TO THE LAD  . ESOPHAGEAL MANOMETRY N/A 03/16/2015   Procedure: ESOPHAGEAL MANOMETRY (EM);  Surgeon: Jerene Bears, MD;  Location: [facility];  Service: Gastroenterology;  Laterality: N/A;  . ESOPHAGOGASTRODUODENOSCOPY N/A 04/08/2015   Procedure: ESOPHAGOGASTRODUODENOSCOPY (EGD);  Surgeon: Milus Banister, MD;  Location: [facility];  Service: Endoscopy;  Laterality: N/A;  . ESOPHAGOGASTRODUODENOSCOPY (EGD) WITH PROPOFOL N/A 09/09/2015   Procedure: ESOPHAGOGASTRODUODENOSCOPY (EGD) WITH PROPOFOL;  Surgeon: Mauri Pole, MD;  Location: [facility];  Service: Endoscopy;  Laterality: N/A;  pt. is to have gastrografin xray post recovery-do not discharge until results are back  . FOOT FRACTURE SURGERY Right 1980's  . INSERT / REPLACE / REMOVE PACEMAKER    . SKIN CANCER EXCISION  "several"   "forearms, head"    Family History  Problem Relation Age of Onset  . Stroke Father     Mother history unknown - never met her  . Colon cancer  Neg Hx     Social History   Social History  . Marital status: Widowed    Spouse name: N/A  . Number of children: 2  . Years of education: GED   Occupational History  . 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 Smoking status: Former Smoker    Packs/day: 1.00    Years: 35.00    Types: Cigarettes    Quit date: 08/01/1992  . Smokeless tobacco: Never Used  . Alcohol use No  . Drug use: No  . Sexual activity: Not Currently   Other Topics Concern  . Not on file   Social History Narrative   Pt lives in [place] alone.  Widowed.   Right-handed.   Rare caffeine use - maybe one cup per month.      Review of systems: Review of Systems  Constitutional: Negative for fever and chills.  HENT: Positive for postnasal drip   Eyes: Negative for blurred vision.  Respiratory: Negative for cough, shortness of breath and  wheezing.   Cardiovascular: Negative for chest pain and palpitations.  Gastrointestinal: as per HPI Genitourinary: Negative for dysuria, urgency, frequency and hematuria.  positive for prostate and bladder problems with weak urine stream Musculoskeletal: Negative for myalgias, back pain and joint pain.  Skin: Negative for itching and rash.  Neurological: Negative for dizziness, tremors, focal weakness, seizures and loss of consciousness.  Endo/Heme/Allergies: Positive for seasonal allergies.  Psychiatric/Behavioral: Negative for depression, suicidal ideas and hallucinations.  All other systems reviewed and are negative.   Physical Exam: Vitals:   12/06/16 1055  BP: (!) 104/52  Pulse: 60   Body mass index is 29.35 kg/m. Gen:      No acute distress HEENT:  EOMI, sclera anicteric Neck:     No masses; no thyromegaly Lungs:    Clear to auscultation bilaterally; normal respiratory effort CV:         Regular rate and rhythm; no murmurs Abd:      + bowel sounds; soft, non-tender; no palpable masses, no distension Ext:    No edema; adequate peripheral perfusion Skin:      Warm and dry; no rash Neuro: alert and oriented x 3 Psych: normal mood and affect  Data Reviewed:  Reviewed labs, radiology imaging, old records and pertinent past GI work up   Assessment and Plan/Recommendations: 83 year-old male with history of CAD, CHF status post ICD, severe COPD, achalasia status post Botox injection September 2016 and pneumatic balloon dilation in February 2017 here for follow-up visit status post recent hospitalization with aspiration pneumonia  I personally reviewed the images of barium swallow and discussed with patient and his son during this visit. Compared to his previous images he has significant improvement in the flow of barium through the EG junction. He denies dysphagia or regurgitation and overall swallowing is significantly better status post pneumatic dilation compared to  prior  This recent episode of aspiration pneumonia could've been triggered by oropharyngeal dysphagia rather than regurgitation from his esophagus I discussed in detail the potential benefit and side effects with repeat pneumatic dilation. At this point it is not very clear how much additional benefit patient would have with repeat dilation as the EG junction does appear to be open with good flow of barium through it Patient and his son agree to hold off pneumatic dilation at this point Continue with dietary modification and  antireflux measures  We will obtain modified barium swallow with FEES to evaluate for oropharyngeal dysphagia and follow-up with a speech and swallow therapist  Return in 6 months or sooner if needed  25 minutes was spent face-to-face with the patient. Greater than 50% of the time used for counseling as well as treatment plan and follow-up of achalasia. He had multiple questions which were answered to his satisfaction  K. Denzil Magnuson , MD [phone] Mon-Fri 8a-5p [phone] after 5p, weekends, holidays  CC: [name], MD

## 2016-12-08 ENCOUNTER — Encounter (HOSPITAL_COMMUNITY): Admission: RE | Payer: Self-pay | Source: Ambulatory Visit

## 2016-12-08 ENCOUNTER — Ambulatory Visit (HOSPITAL_COMMUNITY): Admission: RE | Admit: 2016-12-08 | Payer: Medicare Other | Source: Ambulatory Visit | Admitting: Gastroenterology

## 2016-12-08 ENCOUNTER — Other Ambulatory Visit (HOSPITAL_COMMUNITY): Payer: Self-pay | Admitting: Gastroenterology

## 2016-12-08 DIAGNOSIS — R1319 Other dysphagia: Secondary | ICD-10-CM

## 2016-12-08 SURGERY — ESOPHAGOGASTRODUODENOSCOPY (EGD) WITH PROPOFOL
Anesthesia: General

## 2016-12-10 ENCOUNTER — Other Ambulatory Visit: Payer: Self-pay | Admitting: Pulmonary Disease

## 2016-12-12 ENCOUNTER — Other Ambulatory Visit: Payer: Self-pay | Admitting: *Deleted

## 2016-12-12 DIAGNOSIS — F0390 Unspecified dementia without behavioral disturbance: Secondary | ICD-10-CM | POA: Diagnosis not present

## 2016-12-12 DIAGNOSIS — I10 Essential (primary) hypertension: Secondary | ICD-10-CM | POA: Diagnosis not present

## 2016-12-12 MED ORDER — NITROGLYCERIN 0.2 MG/HR TD PT24: 0.2000 mg | MEDICATED_PATCH | Freq: Every day | TRANSDERMAL | 6 refills | Status: DC

## 2016-12-13 ENCOUNTER — Ambulatory Visit (HOSPITAL_COMMUNITY)
Admission: RE | Admit: 2016-12-13 | Discharge: 2016-12-13 | Disposition: A | Discharge status: Home / Self Care | Payer: Medicare Other | Source: Ambulatory Visit | Attending: Gastroenterology | Admitting: Gastroenterology

## 2016-12-13 DIAGNOSIS — R1310 Dysphagia, unspecified: Secondary | ICD-10-CM | POA: Diagnosis not present

## 2016-12-13 DIAGNOSIS — J690 Pneumonitis due to inhalation of food and vomit: Secondary | ICD-10-CM | POA: Diagnosis not present

## 2016-12-13 DIAGNOSIS — R1311 Dysphagia, oral phase: Secondary | ICD-10-CM | POA: Diagnosis not present

## 2016-12-13 DIAGNOSIS — R1319 Other dysphagia: Secondary | ICD-10-CM

## 2016-12-13 DIAGNOSIS — K220 Achalasia of cardia: Secondary | ICD-10-CM | POA: Diagnosis not present

## 2016-12-15 ENCOUNTER — Other Ambulatory Visit: Payer: Self-pay

## 2016-12-15 ENCOUNTER — Telehealth: Payer: Self-pay | Admitting: *Deleted

## 2016-12-15 MED ORDER — NITROGLYCERIN 0.2 MG/HR TD PT24: 0.2000 mg | MEDICATED_PATCH | Freq: Every day | TRANSDERMAL | 6 refills | Status: DC

## 2016-12-15 NOTE — Telephone Encounter (Signed)
Patients son called and stated that [facility] requested a refill on his fathers nitro patch a week ago and they still have not received a response.

## 2016-12-15 NOTE — Telephone Encounter (Signed)
New Message     Please call to confirm you received this  *STAT* If patient is at the pharmacy, call can be transferred to refill team.   1. Which medications need to be refilled? (please list name of each medication and dose if known)  Nitroglycerin patch  2. Which pharmacy/location (including street and city if local pharmacy) is medication to be sent to? [facility] on [address]  3. Do they need a 30 day or 90 day supply? [place]

## 2016-12-16 ENCOUNTER — Telehealth: Payer: Self-pay

## 2016-12-16 NOTE — Telephone Encounter (Signed)
Antonio Ray with [facility] called to get approval for nitro patch, said the script was not coming through electronically. I gave verbal approval for refill.

## 2016-12-16 NOTE — Telephone Encounter (Signed)
New Message      Prescription did not go through, please call pt

## 2016-12-21 DIAGNOSIS — L84 Corns and callosities: Secondary | ICD-10-CM | POA: Diagnosis not present

## 2016-12-21 DIAGNOSIS — I739 Peripheral vascular disease, unspecified: Secondary | ICD-10-CM | POA: Diagnosis not present

## 2016-12-21 DIAGNOSIS — L603 Nail dystrophy: Secondary | ICD-10-CM | POA: Diagnosis not present

## 2016-12-21 DIAGNOSIS — E1151 Type 2 diabetes mellitus with diabetic peripheral angiopathy without gangrene: Secondary | ICD-10-CM | POA: Diagnosis not present

## 2016-12-28 ENCOUNTER — Ambulatory Visit: Payer: Medicare Other | Admitting: Pulmonary Disease

## 2016-12-29 NOTE — Progress Notes (Signed)
Manus Tobe Sos Date of Birth: 29-Nov-1933 Medical Record #[id number]  History of Present Illness: Mr. Mucci is seen for follow up CAD and CHF.  He is seen with his son. He has a history of coronary disease with remote myocardial infarction in 1994. He had emergent angioplasty of the LAD. Cardiac catheterization 2007 showed nonobstructive disease. He has an ischemic cardiomyopathy with ejection fraction of 30-35%. His last Myoview in 2011 showed an anterior wall scar without ischemia. He has an ICD in place. Most recent Echo in September 2016 showed EF had dropped to 10-15% with global hypokinesis and akinesis of the anteroseptum.  He also has a history of recurrent aspiration PNA.   He had a barium swallow that showed achalasia. Apparently manography confirmed this. He has had esophageal dilation. He has a history of memory loss and is now on Aricept and Namenda. He was admitted 9/21-9/22/17 with acute COPD exacerbation treated with Nebs, steroids, and antibiotics. Echo showed EF 20-25%. No acute CHF. He was admitted in February with COPD exacerbation and April this year with recurrent aspiration pneumonia. Followed by pulmonary and GI. Son reports swallowing evaluation was OK.  On follow up today he states breathing is good. Does have SOB on exertion. No cough. No chest pain. Blood sugars have improved. No  swelling. Weight is down 10 lbs from November. Denies any chest pain.   Current Outpatient Prescriptions on File Prior to Visit  Medication Sig Dispense Refill  . albuterol (PROVENTIL HFA;VENTOLIN HFA) 108 (90 Base) MCG/ACT inhaler Inhale 2 puffs into the lungs every 6 (six) hours as needed for wheezing or shortness of breath. 1 Inhaler 2  . aspirin 81 MG EC tablet Take 81 mg by mouth daily.      . Cholecalciferol (VITAMIN D) 2000 units tablet Take 2,000 Units by mouth daily.     Marland Kitchen donepezil (ARICEPT) 10 MG tablet Take 1 tablet (10 mg total) by mouth every morning. 90 tablet 3  . fenofibrate  (TRICOR) 145 MG tablet TAKE 1 TABLET BY MOUTH EVERY DAY 90 tablet 0  . furosemide (LASIX) 20 MG tablet Take 2 tablets (40 mg total) by mouth daily. 180 tablet 3  . insulin aspart (NOVOLOG FLEXPEN) 100 UNIT/ML FlexPen Inject 5-6 Units into the skin See admin instructions. Inject 6 units SQ with breakfast, 5 units SQ with lunch and 5 units SQ with dinner (Patient taking differently: Inject 5-7 Units into the skin See admin instructions. Sliding scaIe-depending on meal nject 6 units SQ with breakfast, 5 units SQ with lunch and 5 units SQ with dinner)    . Insulin Glargine (BASAGLAR KWIKPEN) 100 UNIT/ML SOPN INJECT 0.3 ML( 30 UNITS) UNDER THE SKIN EVERY MORNING 15 mL 0  . loratadine (CLARITIN) 10 MG tablet Take 1 tablet (10 mg total) by mouth daily. 30 tablet 1  . memantine (NAMENDA) 10 MG tablet Take one tablet twice daily. Please call [phone] for an appointment. (Patient taking differently: Take 10 mg by mouth 2 (two) times daily. ) 180 tablet 0  . Multiple Vitamins-Minerals (PRESERVISION AREDS 2) CAPS Take 1 capsule by mouth 2 (two) times daily.    . nitroGLYCERIN (NITRODUR - DOSED IN MG/24 HR) 0.2 mg/hr patch Place 1 patch (0.2 mg total) onto the skin daily. Alternates patch placement with odd/even days. Even days on left side, odd days on right side. 30 patch 6  . pantoprazole (PROTONIX) 20 MG tablet TAKE 1 TABLET BY MOUTH DAILY 90 tablet 0  . pravastatin (PRAVACHOL)  40 MG tablet Take 1 tablet (40 mg total) by mouth daily. (Patient taking differently: Take 40 mg by mouth at bedtime. ) 90 tablet 1  . SPIRIVA HANDIHALER 18 MCG inhalation capsule INHALE CONTENTS OF 1 CAPSULE VIA HANDIHALER ONCE DAILY 30 capsule 4  . SYMBICORT 160-4.5 MCG/ACT inhaler INHALE 2 PUFFS BY MOUTH TWICE DAILY 10.2 g 5   No current facility-administered medications on file prior to visit.     Allergies  Allergen Reactions  . Codeine Other (See Comments)    Gets very angry, disoriented  . Dilaudid [Hydromorphone Hcl] Other  (See Comments)    VERY AGITATED, HOSTILE  . Flomax [Tamsulosin Hcl] Shortness Of Breath  . Morphine And Related Other (See Comments)    VERY AGITATED, HOSTILE  . Sulfa Antibiotics Shortness Of Breath  . Beta Adrenergic Blockers Other (See Comments)    ? disorientation  . Carvedilol Other (See Comments)    DISORIENTATION    Past Medical History:  Diagnosis Date  . Achalasia   . AICD (automatic cardioverter/defibrillator) present 03/30/2011   Analyze ST study patient  . Anginal pain ([place])   . BPH (benign prostatic hyperplasia)   . CHF (congestive heart failure) ([place])   . Chronic systolic dysfunction of left ventricle    EF 30-35%, CLASS II - III SYMPTOMS; intolerant to Coreg  . CKD (chronic kidney disease) stage 3, GFR 30-59 ml/min 06/08/2012  . COPD (chronic obstructive pulmonary disease) ([place])   . Coronary artery disease    History of remote anterior MI with PCI to LAD in 2006; most recent cath 2007, no intervention required  . DOE (dyspnea on exertion)    with heavy exertion  . Dysrhythmia   . Esophageal dysmotility   . GERD (gastroesophageal reflux disease)   . GERD (gastroesophageal reflux disease)   . Head injury, closed, with concussion 2000ish  . Heart murmur    hx  . HOH (hard of hearing)   . Hyperlipidemia   . Memory loss    improved  . Myocardial infarction ([place]) 1994   ANTERIOR  . Pneumonia "several times"   aspiration pna with at least 3 admits for this 2016.   Marland Kitchen PVC's (premature ventricular contractions)   . Renal failure   . Skin cancer "several"   "forearms; head"  . Type II diabetes mellitus ([place])     Past Surgical History:  Procedure Laterality Date  . BALLOON DILATION N/A 09/09/2015   Procedure: BALLOON DILATION;  Surgeon: Mauri Pole, MD;  Location: [facility];  Service: Endoscopy;  Laterality: N/A;  rigiflex achalasia balloon dilators  . BOTOX INJECTION N/A 04/08/2015   Procedure: BOTOX INJECTION;  Surgeon: Milus Banister, MD;  Location:  [facility];  Service: Endoscopy;  Laterality: N/A;  . CARDIAC CATHETERIZATION  01/11/2006   DEMONSTRATES AKINESIA OF THE DISTAL ANTERIOR WALL, DISTAL INFERIOR WALL AND AKINESIA OF THE APEX. THE BASAL SEGMENTS CONTRACT WELL AND OVERALL EF 35%  . CATARACT EXTRACTION W/ INTRAOCULAR LENS  IMPLANT, BILATERAL  08/2014-09/2014  . CHOLECYSTECTOMY  2/11  . CORONARY ANGIOPLASTY  1994   TO THE LAD  . ESOPHAGEAL MANOMETRY N/A 03/16/2015   Procedure: ESOPHAGEAL MANOMETRY (EM);  Surgeon: Jerene Bears, MD;  Location: [facility];  Service: Gastroenterology;  Laterality: N/A;  . ESOPHAGOGASTRODUODENOSCOPY N/A 04/08/2015   Procedure: ESOPHAGOGASTRODUODENOSCOPY (EGD);  Surgeon: Milus Banister, MD;  Location: [facility];  Service: Endoscopy;  Laterality: N/A;  . ESOPHAGOGASTRODUODENOSCOPY (EGD) WITH PROPOFOL N/A 09/09/2015   Procedure: ESOPHAGOGASTRODUODENOSCOPY (EGD) WITH  PROPOFOL;  Surgeon: Mauri Pole, MD;  Location: [facility];  Service: Endoscopy;  Laterality: N/A;  pt. is to have gastrografin xray post recovery-do not discharge until results are back  . FOOT FRACTURE SURGERY Right 1980's  . INSERT / REPLACE / REMOVE PACEMAKER    . SKIN CANCER EXCISION  "several"   "forearms, head"    History  Smoking Status  . Former Smoker  . Packs/day: 1.00  . Years: 35.00  . Types: Cigarettes  . Quit date: 08/01/1992  Smokeless Tobacco  . Never Used    History  Alcohol Use No    Family History  Problem Relation Age of Onset  . Stroke Father        Mother history unknown - never met her  . Colon cancer Neg Hx     Review of Systems: As noted in history of present illness. All other systems were reviewed and are negative.  Physical Exam: BP (!) 112/58   Pulse 60   Ht 5' 8"  (1.727 m)   Wt 190 lb 9.6 oz (86.5 kg)   SpO2 93%   BMI 28.98 kg/m  Patient is very pleasant and in no acute distress. Skin is warm and dry. Color is normal.  HEENT is unremarkable. Normocephalic/atraumatic. PERRL.  Sclera are nonicteric. Neck is supple. No masses. No JVD. Lungs are clear. Cardiac exam shows a regular rate and rhythm. Normal S1-2. No gallop or murmur. Abdomen is soft and obese. No masses or bruits . Extremities are without edema.  pedal pulses are good. Gait and ROM are intact. No gross neurologic deficits noted.   LABORATORY DATA: Lab Results  Component Value Date   WBC 7.2 11/14/2016   HGB 12.6 (L) 11/14/2016   HCT 40.3 11/14/2016   PLT 152 11/14/2016   GLUCOSE 144 (H) 11/14/2016   CHOL 136 06/20/2013   TRIG 142.0 06/20/2013   HDL 32.00 (L) 06/20/2013   LDLCALC 76 06/20/2013   ALT 24 11/11/2016   AST 32 11/11/2016   NA 141 11/14/2016   K 4.3 11/14/2016   CL 104 11/14/2016   CREATININE 1.59 (H) 11/14/2016   BUN 24 (H) 11/14/2016   CO2 27 11/14/2016   INR 1.26 11/11/2016   HGBA1C 7.9 (H) 11/11/2016   Labs dated 02/05/16: cholesterol 144, triglycerides 223, HDL 40, LDL 59.  Echo: 04/22/16: Study Conclusions  - Left ventricle: The cavity size was normal. There was moderate   concentric hypertrophy. Systolic function was severely reduced.   The estimated ejection fraction was in the range of 20% to 25%.   Wall motion was normal; there were no regional wall motion   abnormalities. Doppler parameters are consistent with abnormal   left ventricular relaxation (grade 1 diastolic dysfunction).   Doppler parameters are consistent with elevated ventricular   end-diastolic filling pressure. - Aortic valve: There was trivial regurgitation. - Aortic root: The aortic root was normal in size. - Mitral valve: Structurally normal valve. There was mild   regurgitation. - Left atrium: The atrium was mildly dilated. - Right ventricle: Pacer wire or catheter noted in right ventricle.   Systolic function was normal. - Right atrium: Pacer wire or catheter noted in right atrium. - Tricuspid valve: There was trivial regurgitation. - Pulmonic valve: There was no regurgitation. - Pulmonary  arteries: Systolic pressure was within the normal   range. - Inferior vena cava: The vessel was normal in size. - Pericardium, extracardiac: There was no pericardial effusion.  Impressions:  - There is  akinesis of the basal and mid anterior, anteroseptal,   inferoseptal walls, mid inferior wall and dyskinesis of the   apical anterior, septal and inferior walls including the true   septum. LVEF has slightly improved from the study on 04/02/2015,   now 20-25%, previously 10-15%.  Last ICD check January 2018 was normal.  Assessment / Plan: 1. Coronary disease with remote anterior myocardial infarction in 1994. Cardiac catheterization 2007 showed nonobstructive disease. His last stress test in October 2011 showed no ischemia. We will continue with his current medical therapy including aspirin. He has a history of intolerance to carvedilol  because of dizziness. On nitrodur patch.  2. Ischemic cardiomyopathy with chronic systolic CHF. Last EF 20-25%. He is class 1-2. He appears to be euvolemic. We'll continue with his current diuretic dose. Note a candidate for beta blocker due to intolerance. Not a candidate for ACEi/ARB due to CKD.   3. Prophylactic ICD. Needs follow up in device clinic.  4. Hyperlipidemia. Controlled.   5. Chronic kidney disease stage III.   6. Recurrent PNA- possibly related to aspiration. Followed by pulmonary and GI.  7. Achalasia. Per GI. S/p esophageal dilation.  8. COPD

## 2016-12-30 ENCOUNTER — Ambulatory Visit (INDEPENDENT_AMBULATORY_CARE_PROVIDER_SITE_OTHER): Payer: Medicare Other | Admitting: Cardiology

## 2016-12-30 ENCOUNTER — Encounter: Payer: Self-pay | Admitting: Cardiology

## 2016-12-30 VITALS — BP 112/58 | HR 60 | Ht 68.0 in | Wt 190.6 lb

## 2016-12-30 DIAGNOSIS — Z9861 Coronary angioplasty status: Secondary | ICD-10-CM

## 2016-12-30 DIAGNOSIS — I255 Ischemic cardiomyopathy: Secondary | ICD-10-CM

## 2016-12-30 DIAGNOSIS — I2510 Atherosclerotic heart disease of native coronary artery without angina pectoris: Secondary | ICD-10-CM | POA: Diagnosis not present

## 2016-12-30 DIAGNOSIS — I495 Sick sinus syndrome: Secondary | ICD-10-CM | POA: Diagnosis not present

## 2016-12-30 DIAGNOSIS — Z95810 Presence of automatic (implantable) cardiac defibrillator: Secondary | ICD-10-CM | POA: Diagnosis not present

## 2016-12-30 DIAGNOSIS — I5022 Chronic systolic (congestive) heart failure: Secondary | ICD-10-CM | POA: Diagnosis not present

## 2016-12-30 DIAGNOSIS — E7800 Pure hypercholesterolemia, unspecified: Secondary | ICD-10-CM | POA: Diagnosis not present

## 2016-12-30 NOTE — Patient Instructions (Signed)
Continue your current therapy  I will see you in 6 months.  We will make sure you have follow up with Dr. Rayann Heman.

## 2017-01-12 DIAGNOSIS — L570 Actinic keratosis: Secondary | ICD-10-CM | POA: Diagnosis not present

## 2017-01-13 ENCOUNTER — Encounter: Payer: Self-pay | Admitting: Pulmonary Disease

## 2017-01-13 ENCOUNTER — Ambulatory Visit (INDEPENDENT_AMBULATORY_CARE_PROVIDER_SITE_OTHER): Payer: Medicare Other | Admitting: Pulmonary Disease

## 2017-01-13 DIAGNOSIS — J449 Chronic obstructive pulmonary disease, unspecified: Secondary | ICD-10-CM

## 2017-01-13 DIAGNOSIS — R1310 Dysphagia, unspecified: Secondary | ICD-10-CM | POA: Diagnosis not present

## 2017-01-13 DIAGNOSIS — R1319 Other dysphagia: Secondary | ICD-10-CM

## 2017-01-13 DIAGNOSIS — I255 Ischemic cardiomyopathy: Secondary | ICD-10-CM | POA: Diagnosis not present

## 2017-01-13 NOTE — Patient Instructions (Signed)
Keep taking your inhalers as you are doing Continue to follow-up with gastroenterology as planned or sooner if you develop new or worsening swallowing problems Stay active I will see you back in October 2018

## 2017-01-13 NOTE — Assessment & Plan Note (Signed)
This is been a stable interval for him despite his severe disease. He has not had an exacerbation since the last visit.  Plan: Continue Symbicort and Spiriva Stay active Follow-up 4 months, flu shot on that visit

## 2017-01-13 NOTE — Progress Notes (Signed)
Subjective:    Patient ID: Antonio Ray, male    DOB: [birth year], 83 y.o.   MRN: [id number]  Synopsis: First the [facility] pulmonary clinic in 2015 for severe COPD. His FEV1 at that time was 29% predicted. He also has a scar in his right lung base believed to be related to pneumonia. He also has a history of achalasia and was admitted for healthcare associated pneumonia multiple times in 2016. He underwent Botox injections in his distal esophagus in late 2016.  HPI  Chief Complaint  Patient presents with  . Follow-up    Pt states his breathing has remained unchanged, He still gets sob with exertion, he still has a slight cough with some mucus, Denies wheezing,chest tightness    Kennon Rounds says that he is walking a lot and he doesn't feel too short of breath unless he is walking up a hill.  He is still taking Symbicort and Spiriva. He hasn't had any episode of choking on food.  He doesn't note any sensation of food getting stuck.     Past Medical History:  Diagnosis Date  . Achalasia   . AICD (automatic cardioverter/defibrillator) present 03/30/2011   Analyze ST study patient  . Anginal pain ([place])   . BPH (benign prostatic hyperplasia)   . CHF (congestive heart failure) ([place])   . Chronic systolic dysfunction of left ventricle    EF 30-35%, CLASS II - III SYMPTOMS; intolerant to Coreg  . CKD (chronic kidney disease) stage 3, GFR 30-59 ml/min 06/08/2012  . COPD (chronic obstructive pulmonary disease) ([place])   . Coronary artery disease    History of remote anterior MI with PCI to LAD in 2006; most recent cath 2007, no intervention required  . DOE (dyspnea on exertion)    with heavy exertion  . Dysrhythmia   . Esophageal dysmotility   . GERD (gastroesophageal reflux disease)   . GERD (gastroesophageal reflux disease)   . Head injury, closed, with concussion 2000ish  . Heart murmur    hx  . HOH (hard of hearing)   . Hyperlipidemia   . Memory loss    improved  . Myocardial  infarction ([place]) 1994   ANTERIOR  . Pneumonia "several times"   aspiration pna with at least 3 admits for this 2016.   Marland Kitchen PVC's (premature ventricular contractions)   . Renal failure   . Skin cancer "several"   "forearms; head"  . Type II diabetes mellitus ([name])      Review of Systems     Objective:   Physical Exam  Vitals:   01/13/17 1320  BP: 106/62  Pulse: (!) 59  SpO2: 96%  Weight: 189 lb 9.6 oz (86 kg)  Height: 5\' 8"  (1.727 m)  Ra  Gen: well appearing HENT: OP clear, TM's clear, neck supple PULM: Crackles R base, normal percussion CV: RRR, no mgr, trace edema GI: BS+, soft, nontender Derm: no cyanosis or rash Psyche: normal mood and affect   Records from GI medicine in May 2018 reviewed were he was felt that he did not have esophageal phase dysphagia but he more likely had oropharyngeal dysphagia.  It was felt that performing a further dilation for his achalasia wouldn't make much of a difference at this point.    11/2016 SLP evaluation> no evidence of aspiration risk       Assessment & Plan:   COPD GOLD IV This is been a stable interval for him despite his severe disease. He has not had an  exacerbation since the last visit.  Plan: Continue Symbicort and Spiriva Stay active Follow-up 4 months, flu shot on that visit  Esophageal dysphagia Continue follow-up with gastroenterology for achalasia.    Updated Medication List Outpatient Encounter Prescriptions as of 01/13/2017  Medication Sig  . albuterol (PROVENTIL HFA;VENTOLIN HFA) 108 (90 Base) MCG/ACT inhaler Inhale 2 puffs into the lungs every 6 (six) hours as needed for wheezing or shortness of breath.  Marland Kitchen aspirin 81 MG EC tablet Take 81 mg by mouth daily.    . Cholecalciferol (VITAMIN D) 2000 units tablet Take 2,000 Units by mouth daily.   Marland Kitchen donepezil (ARICEPT) 10 MG tablet Take 1 tablet (10 mg total) by mouth every morning.  . fenofibrate (TRICOR) 145 MG tablet TAKE 1 TABLET BY MOUTH EVERY DAY  .  fluoruracil (CARAC) 0.5 % cream   . furosemide (LASIX) 20 MG tablet Take 2 tablets (40 mg total) by mouth daily.  . insulin aspart (NOVOLOG FLEXPEN) 100 UNIT/ML FlexPen Inject 5-6 Units into the skin See admin instructions. Inject 6 units SQ with breakfast, 5 units SQ with lunch and 5 units SQ with dinner (Patient taking differently: Inject 5-7 Units into the skin See admin instructions. Sliding scaIe-depending on meal nject 6 units SQ with breakfast, 5 units SQ with lunch and 5 units SQ with dinner)  . Insulin Glargine (BASAGLAR KWIKPEN) 100 UNIT/ML SOPN INJECT 0.3 ML( 30 UNITS) UNDER THE SKIN EVERY MORNING  . loratadine (CLARITIN) 10 MG tablet Take 1 tablet (10 mg total) by mouth daily.  . memantine (NAMENDA) 10 MG tablet Take one tablet twice daily. Please call [phone] for an appointment. (Patient taking differently: Take 10 mg by mouth 2 (two) times daily. )  . Multiple Vitamins-Minerals (PRESERVISION AREDS 2) CAPS Take 1 capsule by mouth 2 (two) times daily.  . nitroGLYCERIN (NITRODUR - DOSED IN MG/24 HR) 0.2 mg/hr patch Place 1 patch (0.2 mg total) onto the skin daily. Alternates patch placement with odd/even days. Even days on left side, odd days on right side.  . pantoprazole (PROTONIX) 20 MG tablet TAKE 1 TABLET BY MOUTH DAILY  . pravastatin (PRAVACHOL) 40 MG tablet Take 1 tablet (40 mg total) by mouth daily. (Patient taking differently: Take 40 mg by mouth at bedtime. )  . SPIRIVA HANDIHALER 18 MCG inhalation capsule INHALE CONTENTS OF 1 CAPSULE VIA HANDIHALER ONCE DAILY  . SYMBICORT 160-4.5 MCG/ACT inhaler INHALE 2 PUFFS BY MOUTH TWICE DAILY   No facility-administered encounter medications on file as of 01/13/2017.

## 2017-01-13 NOTE — Assessment & Plan Note (Signed)
Continue follow-up with gastroenterology for achalasia.

## 2017-02-02 ENCOUNTER — Encounter (HOSPITAL_COMMUNITY): Payer: Self-pay | Admitting: Emergency Medicine

## 2017-02-02 ENCOUNTER — Telehealth: Payer: Self-pay | Admitting: Pulmonary Disease

## 2017-02-02 ENCOUNTER — Emergency Department (HOSPITAL_COMMUNITY): Payer: Medicare Other

## 2017-02-02 ENCOUNTER — Observation Stay (HOSPITAL_COMMUNITY)
Admission: EM | Admit: 2017-02-02 | Discharge: 2017-02-03 | Disposition: A | Discharge status: Home / Self Care | Payer: Medicare Other | Attending: Family Medicine | Admitting: Family Medicine

## 2017-02-02 DIAGNOSIS — I252 Old myocardial infarction: Secondary | ICD-10-CM | POA: Insufficient documentation

## 2017-02-02 DIAGNOSIS — N183 Chronic kidney disease, stage 3 (moderate): Secondary | ICD-10-CM | POA: Diagnosis not present

## 2017-02-02 DIAGNOSIS — Z9049 Acquired absence of other specified parts of digestive tract: Secondary | ICD-10-CM | POA: Insufficient documentation

## 2017-02-02 DIAGNOSIS — J441 Chronic obstructive pulmonary disease with (acute) exacerbation: Principal | ICD-10-CM | POA: Diagnosis present

## 2017-02-02 DIAGNOSIS — I2510 Atherosclerotic heart disease of native coronary artery without angina pectoris: Secondary | ICD-10-CM | POA: Diagnosis not present

## 2017-02-02 DIAGNOSIS — Z87891 Personal history of nicotine dependence: Secondary | ICD-10-CM | POA: Diagnosis not present

## 2017-02-02 DIAGNOSIS — Z794 Long term (current) use of insulin: Secondary | ICD-10-CM | POA: Insufficient documentation

## 2017-02-02 DIAGNOSIS — Z9861 Coronary angioplasty status: Secondary | ICD-10-CM | POA: Insufficient documentation

## 2017-02-02 DIAGNOSIS — Z7982 Long term (current) use of aspirin: Secondary | ICD-10-CM | POA: Diagnosis not present

## 2017-02-02 DIAGNOSIS — J9601 Acute respiratory failure with hypoxia: Secondary | ICD-10-CM | POA: Diagnosis not present

## 2017-02-02 DIAGNOSIS — R069 Unspecified abnormalities of breathing: Secondary | ICD-10-CM | POA: Diagnosis not present

## 2017-02-02 DIAGNOSIS — Z85828 Personal history of other malignant neoplasm of skin: Secondary | ICD-10-CM | POA: Diagnosis not present

## 2017-02-02 DIAGNOSIS — E1122 Type 2 diabetes mellitus with diabetic chronic kidney disease: Secondary | ICD-10-CM | POA: Insufficient documentation

## 2017-02-02 DIAGNOSIS — I5042 Chronic combined systolic (congestive) and diastolic (congestive) heart failure: Secondary | ICD-10-CM | POA: Insufficient documentation

## 2017-02-02 DIAGNOSIS — Z7902 Long term (current) use of antithrombotics/antiplatelets: Secondary | ICD-10-CM | POA: Insufficient documentation

## 2017-02-02 DIAGNOSIS — Z79899 Other long term (current) drug therapy: Secondary | ICD-10-CM | POA: Diagnosis not present

## 2017-02-02 DIAGNOSIS — R0600 Dyspnea, unspecified: Secondary | ICD-10-CM | POA: Diagnosis not present

## 2017-02-02 LAB — CBC WITH DIFFERENTIAL/PLATELET
BASOS PCT: 0 %
Basophils Absolute: 0 10*3/uL (ref 0.0–0.1)
EOS ABS: 0.1 10*3/uL (ref 0.0–0.7)
EOS PCT: 1 %
HCT: 46.3 % (ref 39.0–52.0)
HEMOGLOBIN: 14.3 g/dL (ref 13.0–17.0)
Lymphocytes Relative: 6 %
Lymphs Abs: 0.8 10*3/uL (ref 0.7–4.0)
MCH: 28.1 pg (ref 26.0–34.0)
MCHC: 30.9 g/dL (ref 30.0–36.0)
MCV: 91 fL (ref 78.0–100.0)
Monocytes Absolute: 0.5 10*3/uL (ref 0.1–1.0)
Monocytes Relative: 4 %
NEUTROS PCT: 89 %
Neutro Abs: 11.8 10*3/uL — ABNORMAL HIGH (ref 1.7–7.7)
PLATELETS: 182 10*3/uL (ref 150–400)
RBC: 5.09 MIL/uL (ref 4.22–5.81)
RDW: 15.1 % (ref 11.5–15.5)
WBC: 13.3 10*3/uL — AB (ref 4.0–10.5)

## 2017-02-02 LAB — COMPREHENSIVE METABOLIC PANEL
ALK PHOS: 39 U/L (ref 38–126)
ALT: 22 U/L (ref 17–63)
AST: 32 U/L (ref 15–41)
Albumin: 3.6 g/dL (ref 3.5–5.0)
Anion gap: 11 (ref 5–15)
BUN: 42 mg/dL — AB (ref 6–20)
CALCIUM: 9.2 mg/dL (ref 8.9–10.3)
CO2: 26 mmol/L (ref 22–32)
CREATININE: 2.13 mg/dL — AB (ref 0.61–1.24)
Chloride: 106 mmol/L (ref 101–111)
GFR, EST AFRICAN AMERICAN: 32 mL/min — AB (ref >60)
GFR, EST NON AFRICAN AMERICAN: 27 mL/min — AB (ref >60)
Glucose, Bld: 155 mg/dL — ABNORMAL HIGH (ref 65–99)
Potassium: 3.9 mmol/L (ref 3.5–5.1)
Sodium: 143 mmol/L (ref 135–145)
Total Bilirubin: 0.8 mg/dL (ref 0.3–1.2)
Total Protein: 6.7 g/dL (ref 6.5–8.1)

## 2017-02-02 LAB — BRAIN NATRIURETIC PEPTIDE: B NATRIURETIC PEPTIDE 5: 108.5 pg/mL — AB (ref 0.0–100.0)

## 2017-02-02 LAB — TROPONIN I

## 2017-02-02 LAB — GLUCOSE, CAPILLARY
GLUCOSE-CAPILLARY: 235 mg/dL — AB (ref 65–99)
GLUCOSE-CAPILLARY: 193 mg/dL — AB (ref 65–99)

## 2017-02-02 MED ORDER — ASPIRIN EC 81 MG PO TBEC
81.0000 mg | DELAYED_RELEASE_TABLET | Freq: Every day | ORAL | Status: DC
  Administered 2017-02-03: 81 mg via ORAL
  Filled 2017-02-02: qty 1

## 2017-02-02 MED ORDER — PREDNISONE 20 MG PO TABS
40.0000 mg | ORAL_TABLET | Freq: Every day | ORAL | Status: DC
  Administered 2017-02-03: 40 mg via ORAL
  Filled 2017-02-02: qty 2

## 2017-02-02 MED ORDER — ENOXAPARIN SODIUM 30 MG/0.3ML ~~LOC~~ SOLN
30.0000 mg | SUBCUTANEOUS | Status: DC
  Administered 2017-02-02: 30 mg via SUBCUTANEOUS
  Filled 2017-02-02: qty 0.3

## 2017-02-02 MED ORDER — OCUVITE-LUTEIN PO CAPS
1.0000 | ORAL_CAPSULE | Freq: Two times a day (BID) | ORAL | Status: DC
  Filled 2017-02-02: qty 1

## 2017-02-02 MED ORDER — FENOFIBRATE 54 MG PO TABS
54.0000 mg | ORAL_TABLET | Freq: Every day | ORAL | Status: DC
  Administered 2017-02-03: 54 mg via ORAL
  Filled 2017-02-02: qty 1

## 2017-02-02 MED ORDER — INSULIN ASPART 100 UNIT/ML ~~LOC~~ SOLN
0.0000 [IU] | Freq: Three times a day (TID) | SUBCUTANEOUS | Status: DC
  Administered 2017-02-02: 3 [IU] via SUBCUTANEOUS
  Administered 2017-02-03: 1 [IU] via SUBCUTANEOUS
  Administered 2017-02-03: 2 [IU] via SUBCUTANEOUS

## 2017-02-02 MED ORDER — PRAVASTATIN SODIUM 40 MG PO TABS
40.0000 mg | ORAL_TABLET | Freq: Every day | ORAL | Status: DC
  Administered 2017-02-02: 40 mg via ORAL
  Filled 2017-02-02: qty 1

## 2017-02-02 MED ORDER — LORATADINE 10 MG PO TABS
10.0000 mg | ORAL_TABLET | Freq: Every day | ORAL | Status: DC
  Administered 2017-02-03: 10 mg via ORAL
  Filled 2017-02-02: qty 1

## 2017-02-02 MED ORDER — AZITHROMYCIN 500 MG PO TABS
500.0000 mg | ORAL_TABLET | Freq: Every day | ORAL | Status: DC
  Administered 2017-02-03: 500 mg via ORAL
  Filled 2017-02-02 (×2): qty 1

## 2017-02-02 MED ORDER — PROSIGHT PO TABS
1.0000 | ORAL_TABLET | Freq: Two times a day (BID) | ORAL | Status: DC
  Administered 2017-02-02 – 2017-02-03 (×2): 1 via ORAL
  Filled 2017-02-02 (×2): qty 1

## 2017-02-02 MED ORDER — MEMANTINE HCL 10 MG PO TABS
10.0000 mg | ORAL_TABLET | Freq: Two times a day (BID) | ORAL | Status: DC
  Administered 2017-02-02 – 2017-02-03 (×2): 10 mg via ORAL
  Filled 2017-02-02 (×2): qty 1

## 2017-02-02 MED ORDER — VITAMIN D 1000 UNITS PO TABS
2000.0000 [IU] | ORAL_TABLET | Freq: Every day | ORAL | Status: DC
  Administered 2017-02-03: 2000 [IU] via ORAL
  Filled 2017-02-02: qty 2

## 2017-02-02 MED ORDER — FUROSEMIDE 20 MG PO TABS: 40.0000 mg | ORAL_TABLET | Freq: Every day | ORAL | Status: DC

## 2017-02-02 MED ORDER — INSULIN ASPART 100 UNIT/ML ~~LOC~~ SOLN
4.0000 [IU] | Freq: Three times a day (TID) | SUBCUTANEOUS | Status: DC
  Administered 2017-02-02 – 2017-02-03 (×3): 4 [IU] via SUBCUTANEOUS

## 2017-02-02 MED ORDER — DEXAMETHASONE SODIUM PHOSPHATE 10 MG/ML IJ SOLN
10.0000 mg | Freq: Once | INTRAMUSCULAR | Status: AC
  Administered 2017-02-02: 10 mg via INTRAVENOUS
  Filled 2017-02-02: qty 1

## 2017-02-02 MED ORDER — DONEPEZIL HCL 10 MG PO TABS
10.0000 mg | ORAL_TABLET | Freq: Every morning | ORAL | Status: DC
  Administered 2017-02-03: 10 mg via ORAL
  Filled 2017-02-02: qty 1

## 2017-02-02 MED ORDER — TIOTROPIUM BROMIDE MONOHYDRATE 18 MCG IN CAPS
18.0000 ug | ORAL_CAPSULE | Freq: Every day | RESPIRATORY_TRACT | Status: DC
  Administered 2017-02-03: 18 ug via RESPIRATORY_TRACT
  Filled 2017-02-02: qty 5

## 2017-02-02 MED ORDER — INSULIN ASPART 100 UNIT/ML ~~LOC~~ SOLN: 0.0000 [IU] | Freq: Every day | SUBCUTANEOUS | Status: DC

## 2017-02-02 MED ORDER — INSULIN GLARGINE 100 UNIT/ML ~~LOC~~ SOLN
9.0000 [IU] | Freq: Every day | SUBCUTANEOUS | Status: DC
  Administered 2017-02-03: 9 [IU] via SUBCUTANEOUS
  Filled 2017-02-02: qty 0.09

## 2017-02-02 MED ORDER — DEXTROSE 5 % IV SOLN
500.0000 mg | Freq: Once | INTRAVENOUS | Status: DC
  Filled 2017-02-02: qty 500

## 2017-02-02 MED ORDER — IPRATROPIUM-ALBUTEROL 0.5-2.5 (3) MG/3ML IN SOLN
3.0000 mL | Freq: Once | RESPIRATORY_TRACT | Status: AC
  Administered 2017-02-02: 3 mL via RESPIRATORY_TRACT
  Filled 2017-02-02: qty 3

## 2017-02-02 MED ORDER — ALBUTEROL SULFATE (2.5 MG/3ML) 0.083% IN NEBU
5.0000 mg | INHALATION_SOLUTION | Freq: Once | RESPIRATORY_TRACT | Status: AC
  Administered 2017-02-02: 5 mg via RESPIRATORY_TRACT
  Filled 2017-02-02: qty 6

## 2017-02-02 MED ORDER — ALBUTEROL SULFATE (2.5 MG/3ML) 0.083% IN NEBU: 3.0000 mL | INHALATION_SOLUTION | Freq: Four times a day (QID) | RESPIRATORY_TRACT | Status: DC | PRN

## 2017-02-02 MED ORDER — PANTOPRAZOLE SODIUM 20 MG PO TBEC
20.0000 mg | DELAYED_RELEASE_TABLET | Freq: Every day | ORAL | Status: DC
  Administered 2017-02-03: 20 mg via ORAL
  Filled 2017-02-02: qty 1

## 2017-02-02 MED ORDER — MOMETASONE FURO-FORMOTEROL FUM 200-5 MCG/ACT IN AERO
2.0000 | INHALATION_SPRAY | Freq: Two times a day (BID) | RESPIRATORY_TRACT | Status: DC
  Administered 2017-02-03: 2 via RESPIRATORY_TRACT
  Filled 2017-02-02: qty 8.8

## 2017-02-02 NOTE — Telephone Encounter (Signed)
Pt is currently in ED at [facility] Woke up this morning with SOB and low O2 sats.  Pt Son [name] requested that Dr Spero Curb be made aware.  Will send to BQ as FYI.

## 2017-02-02 NOTE — ED Notes (Signed)
Report attempted nurse to call back.  

## 2017-02-02 NOTE — Telephone Encounter (Signed)
Pt's son, [name] is aware of BQ's below message and voiced his understanding. Nothing further needed.

## 2017-02-02 NOTE — ED Triage Notes (Signed)
Pt arrives by [facility] from home for sob that started this am. Pt has home o2 but reports doesn't use it. Pt is alert and ox4 at this time denies any sob.

## 2017-02-02 NOTE — ED Notes (Signed)
Report called to RN on 6E.  

## 2017-02-02 NOTE — H&P (Signed)
Antonio Ray Admission History and Physical Service Pager: [phone]  Antonio Ray name: Antonio Ray Medical record number: [id number] Date of birth: January 21, 1934 Age: 83 y.o. Gender: male  Primary Care Provider: [name], MD Consultants: None Code Status: Full   Chief Complaint: SOB  Assessment and Plan: Antonio Ray is a 83 y.o. male presenting with COPD exacerbation. PMH is significant for achalasia, COPD, HFrEF, short term memory loss, HLD, Type II DM, BPH.   COPD exacerbation:  Antonio Ray presented with SOB and decreased O2 saturation with history of COPD. Was placed on 1 L O2 in ED. Improved after albuterol neb, Duoneb, and Decadron in ED. Antonio Ray is currently at 94% O2 saturation in ED. CXR with hyperinflation consistent with COPD but no signs of infection. WBC elevated at 13.3 however afebrile. BNP of 108.5 at Antonio Ray's baseline (around 100), Antonio Ray at dry weight, and no signs of fluid overload on exam, so CHF exacerbation less likely cause of SOB. Consider possible aspiration pneumonia given achalasia and history of multiple episodes of aspiration PNA, but unlikely due to negative chest xray.  - Place in observation, attending Dr. Nori Riis - Continuous pulse ox - Continue azithro 575m qd - Prednisone 534mqd - Continue home Symbicort, Spiriva, Claritin - Supplemental O2 as needed - Albuterol PRN wheezing  Achalasia Is followed as outpatient by Dr. NaSilverio DecampHistory of balloon dilatation. Has history of regurgitation of both solids and liquids. Will monitor for aspiration risk.  -Monitor diet and adjust as necessary   HFrEF Followed by Dr. JoMartiniqueutpatient. Echo on 04/2016 showed LV EF of 20-25%. Is currently on 20 mg lasix at home. Currently at dry weight (between 181-183 lbs).  -Hold lasix due to elevated Cr - Monitor fluid status  Acute on chronic kidney disease Diagnosis of Stage 3 CKD. Creatinine elevated to 2.13. Baseline appears to be  ~1.9.  -Continue to monitor -Hold lasix  - Encouraging PO intake rather than IVF given Antonio Ray's history of CHF with EF 20%  T2DM Glucose of 155 on admission. On Lantus 18-20U in AM at home as well as Novolog mealtime coverage. Last A1C 7.9 in 10/2016. -Continue to monitor d/t steroid use -diabetic diet  -Sliding scale insulin  - Lantus 9U in AM - CBG qACHS - Repeat A1C  Short term memory loss Controlled with Namenda and Aricept. Antonio Ray lives along and functions independently, requiring only minimal help from his chidlren.  -Continue home medications   Hyperlipidemia -Continue home pravastatin and fenofibrate   FEN/GI: heart healthy/carb modified diet, Protonix Prophylaxis: Lovenox  Disposition: place in observation  History of Present Illness:   Antonio Ray a [age].o. male presenting with SOB. Antonio Ray has a PMH significant for Achalasia, AICD placement in 2012, Angina, BPH, CHF, Class II-III systolic dysfunction, CKD stage 3, COPD, CAD with cath in 2007, GERD, Hyperlipidemia, T2DM, and MI.   Antonio Ray states that he has been feeling "woozy" since yesterday evening, and reports dizziness and SOB since last night. Woke up this morning feeling particularly SOB. Pulse ox this morning at home 85% initially, then came up to 92%. Normal O2 sat is 96% on RA. Also reports chills beginning this AM and subjective fevers. His daughter brought him to ED after his symptoms did not improve with use of his morning meds. Has cough productive of clear sputum at baseline mostly due to achalasia, but denies increased coughing. Denies nasal congestion. Does have history of CHF, but denies LE edema. Is at dry  weight (between 181-183 lbs).   Antonio Ray has O2 available at home but does not use it. Antonio Ray states he is able to walk a mile at baseline without supplemental O2 or rescue inhaler, however does have to pause to catch his breath on a hill.   Review Of Systems: Per HPI with the following  additions:   Review of Systems  Constitutional: Positive for chills and fever.  HENT: Negative for congestion.   Respiratory: Positive for cough, sputum production and shortness of breath.   Cardiovascular: Negative for chest pain and palpitations.  Gastrointestinal: Negative for nausea and vomiting.  Neurological: Positive for dizziness.    Antonio Ray Active Problem List   Diagnosis Date Noted  . Achalasia of esophagus   . Encephalopathy   . CAP (community acquired pneumonia) 09/22/2016  . Fever chills   . SOB (shortness of breath)   . Acute respiratory failure with hypoxemia ([place])   . Acute respiratory failure ([place]) 04/21/2016  . Chronic combined systolic and diastolic congestive heart failure ([place]) 04/21/2016  . Memory loss 04/21/2016  . Allergic rhinitis 04/11/2016  . Chronic systolic CHF (congestive heart failure) ([place]) 10/30/2015  . Mild cognitive impairment 08/18/2015  . Difficulty in swallowing   . AKI (acute kidney injury) ([place])   . Type 2 diabetes mellitus with stage 3 chronic kidney disease, with long-term current use of insulin ([place])   . Chronic systolic congestive heart failure ([place])   . Chronic kidney disease, stage III (moderate)   . Other specified hypotension   . CKD (chronic kidney disease), stage III   . Sepsis due to pneumonia ([place])   . Aspiration pneumonia due to food (regurgitated) ([place])   . Other emphysema ([place])   . Systolic CHF, acute on chronic ([name])   . AICD (automatic cardioverter/defibrillator) present   . CAD S/P percutaneous coronary angioplasty   . Acute respiratory failure with hypoxia ([place]) 04/03/2015  . Cardiomyopathy, ischemic 04/03/2015  . Elevated troponin 04/03/2015  . Sepsis ([place]) 03/31/2015  . Blood poisoning   . COPD exacerbation ([place])   . HCAP (healthcare-associated pneumonia) and recurrent Aspiration Pneumonia 03/21/2015  . Aspiration into airway 03/21/2015  . Achalasia 03/21/2015  . Dysphagia   . Aspiration pneumonia ([place])   .  Esophageal dysphagia   . Esophageal dysmotilities   . COPD with acute exacerbation ([place]) 01/14/2015  . Left ventricular apical thrombus 01/14/2015  . Acute on chronic systolic CHF (congestive heart failure) ([place]) 01/14/2015  . ARF (acute renal failure) ([place]) 01/14/2015  . Dysphagia, unspecified(787.20) 10/30/2013  . COPD GOLD IV 10/07/2013  . Influenza with pneumonia 09/11/2013  . Leukocytosis 09/11/2013  . Acute encephalopathy 09/10/2013  . Renal failure (ARF), acute on chronic ([name]) 09/10/2013  . Automatic implantable cardioverter-defibrillator in situ 07/06/2012  . CKD (chronic kidney disease) stage 3, GFR 30-59 ml/min 06/08/2012  . GERD (gastroesophageal reflux disease) 09/26/2011  . BPH (benign prostatic hyperplasia) 08/22/2011  . COPD (chronic obstructive pulmonary disease) ([place]) 07/17/2011  . S/P ICD (internal cardiac defibrillator) procedure 04/20/2011  . Coronary artery disease   . Sick sinus syndrome ([place])   . Acute on chronic combined systolic and diastolic congestive heart failure, NYHA class 3 ([place])   . Hyperlipidemia   . PVC's (premature ventricular contractions)     Past Medical History: Past Medical History:  Diagnosis Date  . Achalasia   . AICD (automatic cardioverter/defibrillator) present 03/30/2011   Analyze ST study Antonio Ray  . Anginal pain ([place])   . BPH (benign prostatic hyperplasia)   .  CHF (congestive heart failure) ([place])   . Chronic systolic dysfunction of left ventricle    EF 30-35%, CLASS II - III SYMPTOMS; intolerant to Coreg  . CKD (chronic kidney disease) stage 3, GFR 30-59 ml/min 06/08/2012  . COPD (chronic obstructive pulmonary disease) ([place])   . Coronary artery disease    History of remote anterior MI with PCI to LAD in 2006; most recent cath 2007, no intervention required  . DOE (dyspnea on exertion)    with heavy exertion  . Dysrhythmia   . Esophageal dysmotility   . GERD (gastroesophageal reflux disease)   . GERD (gastroesophageal reflux  disease)   . Head injury, closed, with concussion 2000ish  . Heart murmur    hx  . HOH (hard of hearing)   . Hyperlipidemia   . Memory loss    improved  . Myocardial infarction ([place]) 1994   ANTERIOR  . Pneumonia "several times"   aspiration pna with at least 3 admits for this 2016.   Marland Kitchen PVC's (premature ventricular contractions)   . Renal failure   . Skin cancer "several"   "forearms; head"  . Type II diabetes mellitus ([place])     Past Surgical History: Past Surgical History:  Procedure Laterality Date  . BALLOON DILATION N/A 09/09/2015   Procedure: BALLOON DILATION;  Surgeon: Mauri Pole, MD;  Location: [facility];  Service: Endoscopy;  Laterality: N/A;  rigiflex achalasia balloon dilators  . BOTOX INJECTION N/A 04/08/2015   Procedure: BOTOX INJECTION;  Surgeon: Milus Banister, MD;  Location: [facility];  Service: Endoscopy;  Laterality: N/A;  . CARDIAC CATHETERIZATION  01/11/2006   DEMONSTRATES AKINESIA OF THE DISTAL ANTERIOR WALL, DISTAL INFERIOR WALL AND AKINESIA OF THE APEX. THE BASAL SEGMENTS CONTRACT WELL AND OVERALL EF 35%  . CATARACT EXTRACTION W/ INTRAOCULAR LENS  IMPLANT, BILATERAL  08/2014-09/2014  . CHOLECYSTECTOMY  2/11  . CORONARY ANGIOPLASTY  1994   TO THE LAD  . ESOPHAGEAL MANOMETRY N/A 03/16/2015   Procedure: ESOPHAGEAL MANOMETRY (EM);  Surgeon: Jerene Bears, MD;  Location: [facility];  Service: Gastroenterology;  Laterality: N/A;  . ESOPHAGOGASTRODUODENOSCOPY N/A 04/08/2015   Procedure: ESOPHAGOGASTRODUODENOSCOPY (EGD);  Surgeon: Milus Banister, MD;  Location: [facility];  Service: Endoscopy;  Laterality: N/A;  . ESOPHAGOGASTRODUODENOSCOPY (EGD) WITH PROPOFOL N/A 09/09/2015   Procedure: ESOPHAGOGASTRODUODENOSCOPY (EGD) WITH PROPOFOL;  Surgeon: Mauri Pole, MD;  Location: [facility];  Service: Endoscopy;  Laterality: N/A;  pt. is to have gastrografin xray post recovery-do not discharge until results are back  . FOOT FRACTURE SURGERY Right 1980's  .  INSERT / REPLACE / REMOVE PACEMAKER    . SKIN CANCER EXCISION  "several"   "forearms, head"    Social History: Social History  Substance Use Topics  . Smoking status: Former Smoker    Packs/day: 1.00    Years: 35.00    Types: Cigarettes    Quit date: 08/01/1992  . Smokeless tobacco: Never Used  . Alcohol use No   Additional social history: Lives at home alone but son and daughter are nearby and see him frequently. Independent, drives himself, manages his own medications.  Please also refer to relevant sections of EMR.   1ppd for 35-40 years. Quit smoking in early 1990s. No EtOH use.  Family History: Family History  Problem Relation Age of Onset  . Stroke Father        Mother history unknown - never met her  . Colon cancer Neg Hx     Allergies  and Medications: Allergies  Allergen Reactions  . Codeine Other (See Comments)    Gets very angry, disoriented  . Dilaudid [Hydromorphone Hcl] Other (See Comments)    VERY AGITATED, HOSTILE  . Flomax [Tamsulosin Hcl] Shortness Of Breath  . Morphine And Related Other (See Comments)    VERY AGITATED, HOSTILE  . Sulfa Antibiotics Shortness Of Breath  . Beta Adrenergic Blockers Other (See Comments)    ? disorientation  . Carvedilol Other (See Comments)    DISORIENTATION   No current facility-administered medications on file prior to encounter.    Current Outpatient Prescriptions on File Prior to Encounter  Medication Sig Dispense Refill  . albuterol (PROVENTIL HFA;VENTOLIN HFA) 108 (90 Base) MCG/ACT inhaler Inhale 2 puffs into the lungs every 6 (six) hours as needed for wheezing or shortness of breath. 1 Inhaler 2  . aspirin 81 MG EC tablet Take 81 mg by mouth daily.      . Cholecalciferol (VITAMIN D) 2000 units tablet Take 2,000 Units by mouth daily.     Marland Kitchen donepezil (ARICEPT) 10 MG tablet Take 1 tablet (10 mg total) by mouth every morning. 90 tablet 3  . fenofibrate (TRICOR) 145 MG tablet TAKE 1 TABLET BY MOUTH EVERY DAY 90 tablet  0  . fluoruracil (CARAC) 0.5 % cream Apply 1 application topically 2 (two) times daily.     . furosemide (LASIX) 20 MG tablet Take 2 tablets (40 mg total) by mouth daily. 180 tablet 3  . insulin aspart (NOVOLOG FLEXPEN) 100 UNIT/ML FlexPen Inject 5-6 Units into the skin See admin instructions. Inject 6 units SQ with breakfast, 5 units SQ with lunch and 5 units SQ with dinner (Antonio Ray taking differently: Inject 5-7 Units into the skin See admin instructions. Sliding scaIe-depending on meal nject 6 units SQ with breakfast, 5 units SQ with lunch and 5 units SQ with dinner)    . Insulin Glargine (BASAGLAR KWIKPEN) 100 UNIT/ML SOPN INJECT 0.3 ML( 30 UNITS) UNDER THE SKIN EVERY MORNING (Antonio Ray taking differently: INJECT 18-20 UNITS UNDER THE SKIN EVERY MORNING) 15 mL 0  . loratadine (CLARITIN) 10 MG tablet Take 1 tablet (10 mg total) by mouth daily. 30 tablet 1  . memantine (NAMENDA) 10 MG tablet Take one tablet twice daily. Please call [phone] for an appointment. (Antonio Ray taking differently: Take 10 mg by mouth 2 (two) times daily. ) 180 tablet 0  . Multiple Vitamins-Minerals (PRESERVISION AREDS 2) CAPS Take 1 capsule by mouth 2 (two) times daily.    . nitroGLYCERIN (NITRODUR - DOSED IN MG/24 HR) 0.2 mg/hr patch Place 1 patch (0.2 mg total) onto the skin daily. Alternates patch placement with odd/even days. Even days on left side, odd days on right side. 30 patch 6  . pantoprazole (PROTONIX) 20 MG tablet TAKE 1 TABLET BY MOUTH DAILY 90 tablet 0  . pravastatin (PRAVACHOL) 40 MG tablet Take 1 tablet (40 mg total) by mouth daily. (Antonio Ray taking differently: Take 40 mg by mouth at bedtime. ) 90 tablet 1  . SPIRIVA HANDIHALER 18 MCG inhalation capsule INHALE CONTENTS OF 1 CAPSULE VIA HANDIHALER ONCE DAILY 30 capsule 4  . SYMBICORT 160-4.5 MCG/ACT inhaler INHALE 2 PUFFS BY MOUTH TWICE DAILY 10.2 g 5    Objective: BP (!) 108/57 (BP Location: Right Arm)   Pulse 88   Temp 98.6 F (37 C) (Oral)   Resp 18    Ht _0  (1.727 m)   Wt 181 lb 10.5 oz (82.4 kg)   SpO2 98%  BMI 27.62 kg/m  Exam: General: Antonio Ray is awake and alert, pleasant male sitting up in bed, in NAD Eyes: PERRL, EOMI ENTM: moist mucous membranes, no erythema or tonsillar swelling  Cardiovascular: RRR, no MRG Respiratory: Decreased breath sounds bilaterally, normal WOB on 1L, able to speak in full sentences, no wheezes Gastrointestinal: soft, non tender, distended MSK: 5+ muscle strength in all extremities, normal range of motion  Derm: skin intact, nevi noted on chest wall  Neuro: sensation intact bilaterally Psych: normal affect   Labs and Imaging: CBC BMET   Recent Labs Lab 02/02/17 0901  WBC 13.3*  HGB 14.3  HCT 46.3  PLT 182    Recent Labs Lab 02/02/17 0901  NA 143  K 3.9  CL 106  CO2 26  BUN 42*  CREATININE 2.13*  GLUCOSE 155*  CALCIUM 9.2     Dg Chest 2 View  Result Date: 02/02/2017 CLINICAL DATA:  Dyspnea EXAM: CHEST  2 VIEW COMPARISON:  11/24/2016 chest radiograph. FINDINGS: Stable configuration of 2 lead left subclavian ICD. Stable cardiomediastinal silhouette with top-normal heart size and aortic atherosclerosis. No pneumothorax. No pleural effusion. Hyperinflated lungs. No pulmonary edema. No acute consolidative airspace disease. IMPRESSION: 1. Hyperinflated lungs, suggesting COPD . 2. No acute cardiopulmonary disease. Electronically Signed   By: Ilona Sorrel M.D.   On: 02/02/2017 08:42    Caroline More, DO 02/02/2017, 2:26 PM PGY-1, [facility] pager: [phone], text pages welcome  UPPER LEVEL ADDENDUM  I have read the above note and made revisions highlighted in orange.  Adin Hector, MD, MPH PGY-3 [facility] Pager [phone]

## 2017-02-02 NOTE — ED Provider Notes (Signed)
[facility] DEPT Provider Note   CSN: [id number] Arrival date & time: 02/02/17  3295     History   Chief Complaint Chief Complaint  Patient presents with  . Shortness of Breath    HPI Antonio Ray is a 83 y.o. male.  HPI  Patient presents with his son who assists with the history of present illness. Patient has multiple medical issues including CHF, CAD, achalasia, does not wear oxygen at home. He went to bed in his usual state of health. Today the patient awakened with dyspnea. Per report patient's pulse oxygen saturation level on EMS arrival was below 80%. After receiving oxygen via nonrebreather mask, and nasal cannula, the patient had pulse oxygen saturation of 95%, prior to ED arrival. The patient denies chest pain, abdominal pain, fever, chills, confusion, disorientation. No clear precipitant for today's event. He notes that he has been generally well, with no recent medication changes, diet changes, activity changes. Last episode of respiratory difficulty was approximately 5 months ago.   Past Medical History:  Diagnosis Date  . Achalasia   . AICD (automatic cardioverter/defibrillator) present 03/30/2011   Analyze ST study patient  . Anginal pain ([place])   . BPH (benign prostatic hyperplasia)   . CHF (congestive heart failure) ([place])   . Chronic systolic dysfunction of left ventricle    EF 30-35%, CLASS II - III SYMPTOMS; intolerant to Coreg  . CKD (chronic kidney disease) stage 3, GFR 30-59 ml/min 06/08/2012  . COPD (chronic obstructive pulmonary disease) ([place])   . Coronary artery disease    History of remote anterior MI with PCI to LAD in 2006; most recent cath 2007, no intervention required  . DOE (dyspnea on exertion)    with heavy exertion  . Dysrhythmia   . Esophageal dysmotility   . GERD (gastroesophageal reflux disease)   . GERD (gastroesophageal reflux disease)   . Head injury, closed, with concussion 2000ish  . Heart murmur    hx  . HOH (hard  of hearing)   . Hyperlipidemia   . Memory loss    improved  . Myocardial infarction ([place]) 1994   ANTERIOR  . Pneumonia "several times"   aspiration pna with at least 3 admits for this 2016.   Marland Kitchen PVC's (premature ventricular contractions)   . Renal failure   . Skin cancer "several"   "forearms; head"  . Type II diabetes mellitus [facility])     Patient Active Problem List   Diagnosis Date Noted  . Achalasia of esophagus   . Encephalopathy   . CAP (community acquired pneumonia) 09/22/2016  . Fever chills   . SOB (shortness of breath)   . Acute respiratory failure with hypoxemia ([place])   . Acute respiratory failure ([place]) 04/21/2016  . Chronic combined systolic and diastolic congestive heart failure ([place]) 04/21/2016  . Memory loss 04/21/2016  . Allergic rhinitis 04/11/2016  . Chronic systolic CHF (congestive heart failure) ([place]) 10/30/2015  . Mild cognitive impairment 08/18/2015  . Difficulty in swallowing   . AKI (acute kidney injury) ([place])   . Type 2 diabetes mellitus with stage 3 chronic kidney disease, with long-term current use of insulin ([place])   . Chronic systolic congestive heart failure ([place])   . Chronic kidney disease, stage III (moderate)   . Other specified hypotension   . CKD (chronic kidney disease), stage III   . Sepsis due to pneumonia ([place])   . Aspiration pneumonia due to food (regurgitated) ([place])   . Other emphysema ([place])   .  Systolic CHF, acute on chronic ([name])   . AICD (automatic cardioverter/defibrillator) present   . CAD S/P percutaneous coronary angioplasty   . Acute respiratory failure with hypoxia ([place]) 04/03/2015  . Cardiomyopathy, ischemic 04/03/2015  . Elevated troponin 04/03/2015  . Sepsis ([place]) 03/31/2015  . Blood poisoning   . COPD exacerbation ([place])   . HCAP (healthcare-associated pneumonia) and recurrent Aspiration Pneumonia 03/21/2015  . Aspiration into airway 03/21/2015  . Achalasia 03/21/2015  . Dysphagia   . Aspiration pneumonia ([place])   .  Esophageal dysphagia   . Esophageal dysmotilities   . COPD with acute exacerbation ([place]) 01/14/2015  . Left ventricular apical thrombus 01/14/2015  . Acute on chronic systolic CHF (congestive heart failure) ([place]) 01/14/2015  . ARF (acute renal failure) ([place]) 01/14/2015  . Dysphagia, unspecified(787.20) 10/30/2013  . COPD GOLD IV 10/07/2013  . Influenza with pneumonia 09/11/2013  . Leukocytosis 09/11/2013  . Acute encephalopathy 09/10/2013  . Renal failure (ARF), acute on chronic ([name]) 09/10/2013  . Automatic implantable cardioverter-defibrillator in situ 07/06/2012  . CKD (chronic kidney disease) stage 3, GFR 30-59 ml/min 06/08/2012  . GERD (gastroesophageal reflux disease) 09/26/2011  . BPH (benign prostatic hyperplasia) 08/22/2011  . COPD (chronic obstructive pulmonary disease) ([place]) 07/17/2011  . S/P ICD (internal cardiac defibrillator) procedure 04/20/2011  . Coronary artery disease   . Sick sinus syndrome ([place])   . Acute on chronic combined systolic and diastolic congestive heart failure, NYHA class 3 ([place])   . Hyperlipidemia   . PVC's (premature ventricular contractions)     Past Surgical History:  Procedure Laterality Date  . BALLOON DILATION N/A 09/09/2015   Procedure: BALLOON DILATION;  Surgeon: Mauri Pole, MD;  Location: [facility];  Service: Endoscopy;  Laterality: N/A;  rigiflex achalasia balloon dilators  . BOTOX INJECTION N/A 04/08/2015   Procedure: BOTOX INJECTION;  Surgeon: Milus Banister, MD;  Location: [facility];  Service: Endoscopy;  Laterality: N/A;  . CARDIAC CATHETERIZATION  01/11/2006   DEMONSTRATES AKINESIA OF THE DISTAL ANTERIOR WALL, DISTAL INFERIOR WALL AND AKINESIA OF THE APEX. THE BASAL SEGMENTS CONTRACT WELL AND OVERALL EF 35%  . CATARACT EXTRACTION W/ INTRAOCULAR LENS  IMPLANT, BILATERAL  08/2014-09/2014  . CHOLECYSTECTOMY  2/11  . CORONARY ANGIOPLASTY  1994   TO THE LAD  . ESOPHAGEAL MANOMETRY N/A 03/16/2015   Procedure: ESOPHAGEAL MANOMETRY  (EM);  Surgeon: Jerene Bears, MD;  Location: [facility];  Service: Gastroenterology;  Laterality: N/A;  . ESOPHAGOGASTRODUODENOSCOPY N/A 04/08/2015   Procedure: ESOPHAGOGASTRODUODENOSCOPY (EGD);  Surgeon: Milus Banister, MD;  Location: [facility];  Service: Endoscopy;  Laterality: N/A;  . ESOPHAGOGASTRODUODENOSCOPY (EGD) WITH PROPOFOL N/A 09/09/2015   Procedure: ESOPHAGOGASTRODUODENOSCOPY (EGD) WITH PROPOFOL;  Surgeon: Mauri Pole, MD;  Location: [facility];  Service: Endoscopy;  Laterality: N/A;  pt. is to have gastrografin xray post recovery-do not discharge until results are back  . FOOT FRACTURE SURGERY Right 1980's  . INSERT / REPLACE / REMOVE PACEMAKER    . SKIN CANCER EXCISION  "several"   "forearms, head"       Home Medications    Prior to Admission medications   Medication Sig Start Date End Date Taking? Authorizing Provider  albuterol (PROVENTIL HFA;VENTOLIN HFA) 108 (90 Base) MCG/ACT inhaler Inhale 2 puffs into the lungs every 6 (six) hours as needed for wheezing or shortness of breath. 04/22/16   Eugenie Filler, MD  aspirin 81 MG EC tablet Take 81 mg by mouth daily.      [provider]  Cholecalciferol (VITAMIN D) 2000 units tablet Take 2,000 Units by mouth daily.     [provider]  donepezil (ARICEPT) 10 MG tablet Take 1 tablet (10 mg total) by mouth every morning. 08/18/15   Marcial Pacas, MD  fenofibrate (TRICOR) 145 MG tablet TAKE 1 TABLET BY MOUTH EVERY DAY 11/15/16   [place], Peter M, MD  fluoruracil [facility]) 0.5 % cream  01/12/17   [provider]  furosemide (LASIX) 20 MG tablet Take 2 tablets (40 mg total) by mouth daily. 11/22/16   [place], Peter M, MD  insulin aspart (NOVOLOG FLEXPEN) 100 UNIT/ML FlexPen Inject 5-6 Units into the skin See admin instructions. Inject 6 units SQ with breakfast, 5 units SQ with lunch and 5 units SQ with dinner Patient taking differently: Inject 5-7 Units into the skin See admin instructions. Sliding  scaIe-depending on meal nject 6 units SQ with breakfast, 5 units SQ with lunch and 5 units SQ with dinner 09/22/16   Patrecia Pour, Christean Grief, MD  Insulin [facility] KWIKPEN) 100 UNIT/ML SOPN INJECT 0.3 ML( 30 UNITS) UNDER THE SKIN EVERY MORNING 12/01/16   Philemon Kingdom, MD  loratadine (CLARITIN) 10 MG tablet Take 1 tablet (10 mg total) by mouth daily. 07/02/15   Barton Dubois, MD  memantine (NAMENDA) 10 MG tablet Take one tablet twice daily. Please call [phone] for an appointment. Patient taking differently: Take 10 mg by mouth 2 (two) times daily.  08/22/16   Marcial Pacas, MD  Multiple Vitamins-Minerals (PRESERVISION AREDS 2) CAPS Take 1 capsule by mouth 2 (two) times daily.    [provider]  nitroGLYCERIN (NITRODUR - DOSED IN MG/24 HR) 0.2 mg/hr patch Place 1 patch (0.2 mg total) onto the skin daily. Alternates patch placement with odd/even days. Even days on left side, odd days on right side. 12/15/16   [place], Peter M, MD  pantoprazole (PROTONIX) 20 MG tablet TAKE 1 TABLET BY MOUTH DAILY 11/14/16   Mauri Pole, MD  pravastatin (PRAVACHOL) 40 MG tablet Take 1 tablet (40 mg total) by mouth daily. Patient taking differently: Take 40 mg by mouth at bedtime.  11/01/16   [place], Peter M, MD  SPIRIVA HANDIHALER 18 MCG inhalation capsule INHALE CONTENTS OF 1 CAPSULE VIA HANDIHALER ONCE DAILY 09/05/16   Juanito Doom, MD  SYMBICORT 160-4.5 MCG/ACT inhaler INHALE 2 PUFFS BY MOUTH TWICE DAILY 12/12/16   Juanito Doom, MD    Family History Family History  Problem Relation Age of Onset  . Stroke Father        Mother history unknown - never met her  . Colon cancer Neg Hx     Social History Social History  Substance Use Topics  . Smoking status: Former Smoker    Packs/day: 1.00    Years: 35.00    Types: Cigarettes    Quit date: 08/01/1992  . Smokeless tobacco: Never Used  . Alcohol use No     Allergies   Codeine; Dilaudid [hydromorphone hcl]; Flomax [tamsulosin hcl];  Morphine and related; Sulfa antibiotics; Beta adrenergic blockers; and Carvedilol   Review of Systems Review of Systems  Constitutional:       Per HPI, otherwise negative  HENT:       Per HPI, otherwise negative  Respiratory:       Per HPI, otherwise negative  Cardiovascular:       Per HPI, otherwise negative  Gastrointestinal: Negative for vomiting.  Endocrine:       Negative aside from HPI  Genitourinary:       Neg aside from HPI   Musculoskeletal:       Per HPI, otherwise negative  Skin: Negative.   Neurological: Negative for syncope.     Physical Exam Updated Vital Signs Ht _0  (1.727 m)   Wt 85.7 kg (189 lb)   BMI 28.74 kg/m   Physical Exam  Constitutional: He is oriented to person, place, and time. He appears well-developed. No distress.  HENT:  Head: Normocephalic and atraumatic.  Eyes: Conjunctivae and EOM are normal.  Cardiovascular: Normal rate and regular rhythm.   Pulmonary/Chest: Accessory muscle usage present. No stridor. Tachypnea noted. He has decreased breath sounds.  Abdominal: He exhibits no distension.  Musculoskeletal: He exhibits no edema.  Neurological: He is alert and oriented to person, place, and time.  Skin: Skin is warm and dry.  Psychiatric: He has a normal mood and affect.  Nursing note and vitals reviewed.    ED Treatments / Results  Labs (all labs ordered are listed, but only abnormal results are displayed) Labs Reviewed  COMPREHENSIVE METABOLIC PANEL - Abnormal; Notable for the following:       Result Value   Glucose, Bld 155 (*)    BUN 42 (*)    Creatinine, Ser 2.13 (*)    GFR calc non Af Amer 27 (*)    GFR calc Af Amer 32 (*)    All other components within normal limits  CBC WITH DIFFERENTIAL/PLATELET - Abnormal; Notable for the following:    WBC 13.3 (*)    Neutro Abs 11.8 (*)    All other components within normal limits  BRAIN NATRIURETIC PEPTIDE - Abnormal; Notable for the following:    B Natriuretic Peptide  108.5 (*)    All other components within normal limits  TROPONIN I    EKG  EKG Interpretation  Date/Time:  Thursday February 02 2017 08:26:09 EDT Ventricular Rate:  92 PR Interval:    QRS Duration: 155 QT Interval:  386 QTC Calculation: 478 R Axis:   -79 Text Interpretation:  Sinus rhythm Probable left atrial enlargement Nonspecific IVCD with LAD LVH with secondary repolarization abnormality Artifact ST-t wave abnormality Abnormal ekg Confirmed by Carmin Muskrat [phone]) on 02/02/2017 8:31:49 AM     Pulse ox 98% nasal cannula abnormal   Radiology Dg Chest 2 View  Result Date: 02/02/2017 CLINICAL DATA:  Dyspnea EXAM: CHEST  2 VIEW COMPARISON:  11/24/2016 chest radiograph. FINDINGS: Stable configuration of 2 lead left subclavian ICD. Stable cardiomediastinal silhouette with top-normal heart size and aortic atherosclerosis. No pneumothorax. No pleural effusion. Hyperinflated lungs. No pulmonary edema. No acute consolidative airspace disease. IMPRESSION: 1. Hyperinflated lungs, suggesting COPD . 2. No acute cardiopulmonary disease. Electronically Signed   By: Ilona Sorrel M.D.   On: 02/02/2017 08:42    Procedures Procedures (including critical care time)  Medications Ordered in ED Medications  albuterol (PROVENTIL) (2.5 MG/3ML) 0.083% nebulizer solution 5 mg (5 mg Nebulization Given 02/02/17 0901)  dexamethasone (DECADRON) injection 10 mg (10 mg Intravenous Given 02/02/17 0937)  ipratropium-albuterol (DUONEB) 0.5-2.5 (3) MG/3ML nebulizer solution 3 mL (3 mLs Nebulization Given 02/02/17 1047)     Initial Impression / Assessment and Plan / ED Course  I have reviewed the triage vital signs and the nursing notes.  Pertinent labs & imaging results that were available during my care of the patient were reviewed by me and considered in my medical decision making (see chart for details).    11:54 AM After initial  nebulizer treatment, and second DuoNeb, the patient continues to have borderline  hypoxia, 91% on room air, improves with nasal cannula. With concern for ongoing mild hypoxia, given the patient's absence of oxygen use at home, concern for COPD exacerbation, the patient will be admitted for further evaluation and management.  Final Clinical Impressions(s) / ED Diagnoses  COPD exacerbation   Carmin Muskrat, MD 02/02/17 1155

## 2017-02-02 NOTE — Consult Note (Signed)
PULMONARY / CRITICAL CARE MEDICINE   Name: RACHARD ISIDRO MRN: [id number] 02/02/2017 DOB: 1933-10-09    ADMISSION DATE:  02/02/2017 CONSULTATION DATE:  02/02/2017  REFERRING MD:  Dr. Nori Riis  CHIEF COMPLAINT:  Shortness of breath  HISTORY OF PRESENT ILLNESS:   This is a pleasant 83 year old male who happens to be my office patient for Linna Hoff COPD who came to the emergency department today for shortness of breath and confusion. He stated that in the last several days he was in his usual state of health and denied any sick contacts or fevers or chills. He says that he had not been spending excessive time outside and in fact avoided his morning walk yesterday as of the heat. However overnight he developed some confusion around 2 AM when he cut to go to the bathroom and then by early morning he said he was feeling shortness of breath and a dry cough. He had difficulty walking across his living room and he said he felt "disoriented". His daughter responded and checked his O2 saturation found them to be 85% on room air. EMS was called and brought in to the emergency department. In the ER he was noted to be wheezing and was given Decadron and DuoNeb. He has required oxygen to maintain his O2 saturation greater than 92%. He is admitted and the family requested consultation from pulmonary.  PAST MEDICAL HISTORY :  He  has a past medical history of Achalasia; AICD (automatic cardioverter/defibrillator) present (03/30/2011); Anginal pain ([name]); BPH (benign prostatic hyperplasia); CHF (congestive heart failure) ([place]); Chronic systolic dysfunction of left ventricle; CKD (chronic kidney disease) stage 3, GFR 30-59 ml/min (06/08/2012); COPD (chronic obstructive pulmonary disease) ([place]); Coronary artery disease; DOE (dyspnea on exertion); Dysrhythmia; Esophageal dysmotility; GERD (gastroesophageal reflux disease); GERD (gastroesophageal reflux disease); Head injury, closed, with concussion (2000ish); Heart murmur; HOH (hard  of hearing); Hyperlipidemia; Memory loss; Myocardial infarction ([place]) (1994); Pneumonia ("several times"); PVC's (premature ventricular contractions); Renal failure; Skin cancer ("several"); and Type II diabetes mellitus ([place]).  PAST SURGICAL HISTORY: He  has a past surgical history that includes Foot fracture surgery (Right, 1980's); Cholecystectomy (2/11); Cataract extraction w/ intraocular lens  implant, bilateral (08/2014-09/2014); Cardiac catheterization (01/11/2006); Coronary angioplasty (1994); Skin cancer excision ("several"); Esophageal manometry (N/A, 03/16/2015); Insert / replace / remove pacemaker; Esophagogastroduodenoscopy (N/A, 04/08/2015); Botox injection (N/A, 04/08/2015); Esophagogastroduodenoscopy (egd) with propofol (N/A, 09/09/2015); and Balloon dilation (N/A, 09/09/2015).  Allergies  Allergen Reactions  . Codeine Other (See Comments)    Gets very angry, disoriented  . Dilaudid [Hydromorphone Hcl] Other (See Comments)    VERY AGITATED, HOSTILE  . Flomax [Tamsulosin Hcl] Shortness Of Breath  . Morphine And Related Other (See Comments)    VERY AGITATED, HOSTILE  . Sulfa Antibiotics Shortness Of Breath  . Beta Adrenergic Blockers Other (See Comments)    ? disorientation  . Carvedilol Other (See Comments)    DISORIENTATION    No current facility-administered medications on file prior to encounter.    Current Outpatient Prescriptions on File Prior to Encounter  Medication Sig  . albuterol (PROVENTIL HFA;VENTOLIN HFA) 108 (90 Base) MCG/ACT inhaler Inhale 2 puffs into the lungs every 6 (six) hours as needed for wheezing or shortness of breath.  Marland Kitchen aspirin 81 MG EC tablet Take 81 mg by mouth daily.    . Cholecalciferol (VITAMIN D) 2000 units tablet Take 2,000 Units by mouth daily.   Marland Kitchen donepezil (ARICEPT) 10 MG tablet Take 1 tablet (10 mg total) by mouth every morning.  Marland Kitchen  fenofibrate (TRICOR) 145 MG tablet TAKE 1 TABLET BY MOUTH EVERY DAY  . fluoruracil (CARAC) 0.5 % cream Apply 1  application topically 2 (two) times daily.   . furosemide (LASIX) 20 MG tablet Take 2 tablets (40 mg total) by mouth daily.  . insulin aspart (NOVOLOG FLEXPEN) 100 UNIT/ML FlexPen Inject 5-6 Units into the skin See admin instructions. Inject 6 units SQ with breakfast, 5 units SQ with lunch and 5 units SQ with dinner (Patient taking differently: Inject 5-7 Units into the skin See admin instructions. Sliding scaIe-depending on meal nject 6 units SQ with breakfast, 5 units SQ with lunch and 5 units SQ with dinner)  . Insulin Glargine (BASAGLAR KWIKPEN) 100 UNIT/ML SOPN INJECT 0.3 ML( 30 UNITS) UNDER THE SKIN EVERY MORNING (Patient taking differently: INJECT 18-20 UNITS UNDER THE SKIN EVERY MORNING)  . loratadine (CLARITIN) 10 MG tablet Take 1 tablet (10 mg total) by mouth daily.  . memantine (NAMENDA) 10 MG tablet Take one tablet twice daily. Please call [phone] for an appointment. (Patient taking differently: Take 10 mg by mouth 2 (two) times daily. )  . Multiple Vitamins-Minerals (PRESERVISION AREDS 2) CAPS Take 1 capsule by mouth 2 (two) times daily.  . nitroGLYCERIN (NITRODUR - DOSED IN MG/24 HR) 0.2 mg/hr patch Place 1 patch (0.2 mg total) onto the skin daily. Alternates patch placement with odd/even days. Even days on left side, odd days on right side.  . pantoprazole (PROTONIX) 20 MG tablet TAKE 1 TABLET BY MOUTH DAILY  . pravastatin (PRAVACHOL) 40 MG tablet Take 1 tablet (40 mg total) by mouth daily. (Patient taking differently: Take 40 mg by mouth at bedtime. )  . SPIRIVA HANDIHALER 18 MCG inhalation capsule INHALE CONTENTS OF 1 CAPSULE VIA HANDIHALER ONCE DAILY  . SYMBICORT 160-4.5 MCG/ACT inhaler INHALE 2 PUFFS BY MOUTH TWICE DAILY    FAMILY HISTORY:  His indicated that his mother is deceased. He indicated that his father is deceased. He indicated that only one of his two sisters is alive. He indicated that the status of his neg hx is unknown.    SOCIAL HISTORY: He  reports that he quit  smoking about 24 years ago. His smoking use included Cigarettes. He has a 35.00 pack-year smoking history. He has never used smokeless tobacco. He reports that he does not drink alcohol or use drugs.  REVIEW OF SYSTEMS:   Gen: Denies fever, chills, weight change, fatigue, night sweats HEENT: Denies blurred vision, double vision, hearing loss, tinnitus, sinus congestion, rhinorrhea, sore throat, neck stiffness, dysphagia PULM: per HPI CV: Denies chest pain, edema, orthopnea, paroxysmal nocturnal dyspnea, palpitations GI: Denies abdominal pain, nausea, vomiting, diarrhea, hematochezia, melena, constipation, change in bowel habits GU: Denies dysuria, hematuria, polyuria, oliguria, urethral discharge Endocrine: Denies hot or cold intolerance, polyuria, polyphagia or appetite change Derm: Denies rash, dry skin, scaling or peeling skin change Heme: Denies easy bruising, bleeding, bleeding gums Neuro: Denies headache, numbness, weakness, slurred speech, loss of memory or consciousness   SUBJECTIVE:  As above  VITAL SIGNS: BP (!) 108/57 (BP Location: Right Arm)   Pulse 88   Temp 98.6 F (37 C) (Oral)   Resp 18   Ht 5\' 8"  (1.727 m)   Wt 82.4 kg (181 lb 10.5 oz)   SpO2 98%   BMI 27.62 kg/m   HEMODYNAMICS:    VENTILATOR SETTINGS:    INTAKE / OUTPUT: No intake/output data recorded.  PHYSICAL EXAMINATION: General:  Resting comfortably in bed Neuro:  Awake, alert, oriented 4  HEENT:  Normocephalic atraumatic, oropharynx clear Cardiovascular:  Irregularly irregular, no murmurs gallops or rubs Lungs:  Wheezes in the right lung, no crackles, normal air movement on the left Abdomen:  Bowel sounds positive, nontender nondistended Musculoskeletal:  Normal bulk and tone Skin:  No rash or skin breakdown  LABS:  BMET  Recent Labs Lab 02/02/17 0901  NA 143  K 3.9  CL 106  CO2 26  BUN 42*  CREATININE 2.13*  GLUCOSE 155*    Electrolytes  Recent Labs Lab 02/02/17 0901   CALCIUM 9.2    CBC  Recent Labs Lab 02/02/17 0901  WBC 13.3*  HGB 14.3  HCT 46.3  PLT 182    Coag's No results for input(s): APTT, INR in the last 168 hours.  Sepsis Markers No results for input(s): LATICACIDVEN, PROCALCITON, O2SATVEN in the last 168 hours.  ABG No results for input(s): PHART, PCO2ART, PO2ART in the last 168 hours.  Liver Enzymes  Recent Labs Lab 02/02/17 0901  AST 32  ALT 22  ALKPHOS 39  BILITOT 0.8  ALBUMIN 3.6    Cardiac Enzymes  Recent Labs Lab 02/02/17 0901  TROPONINI <0.03    Glucose No results for input(s): GLUCAP in the last 168 hours.  Imaging Dg Chest 2 View  Result Date: 02/02/2017 CLINICAL DATA:  Dyspnea EXAM: CHEST  2 VIEW COMPARISON:  11/24/2016 chest radiograph. FINDINGS: Stable configuration of 2 lead left subclavian ICD. Stable cardiomediastinal silhouette with top-normal heart size and aortic atherosclerosis. No pneumothorax. No pleural effusion. Hyperinflated lungs. No pulmonary edema. No acute consolidative airspace disease. IMPRESSION: 1. Hyperinflated lungs, suggesting COPD . 2. No acute cardiopulmonary disease. Electronically Signed   By: Ilona Sorrel M.D.   On: 02/02/2017 08:42     STUDIES:  02/02/2017 chest x-ray images independently reviewed showing hyperinflation consistent with emphysema, no infiltrate   DISCUSSION: 83 year old male with a past medical history significant for severe COPD admitted with acute respiratory failure with hypoxemia secondary to a COPD exacerbation. He has a history of aspiration pneumonitis in the past but at this time I can't find evidence of that. I wonder if the poor air quality we have been experiencing is contributing to his shortness of breath and COPD exacerbation.  ASSESSMENT / PLAN:  PULMONARY A: Acute respiratory failure with hypoxemia Acute exacerbation of COPD P:   Continue Dulera in hospital, continue Symbicort and hospital Tomorrow will discharge with a prednisone  taper: 40 mg daily 1 day, 30 mg daily 1 day, 20 mg daily 2 days, 10 mg daily 2 days Continue DuoNeb as you're doing Continue oxygen as you're doing to maintain O2 saturation 90-94% Would perform ambulatory oximetry evaluation prior to discharge tomorrow We will make arrangements for follow-up with pulmonary in 1-2 weeks  Will come back and see him as needed  Pulmonary and critical care medicine will sign off  Roselie Awkward, MD [place] [facility] Pager: [phone] Cell: [phone] After 3pm or if no response, call [phone]   02/02/2017, 3:23 PM

## 2017-02-02 NOTE — Telephone Encounter (Signed)
I'll see him

## 2017-02-02 NOTE — ED Notes (Signed)
Admitting at bedside 

## 2017-02-02 NOTE — ED Notes (Signed)
Family at bedside. 

## 2017-02-03 ENCOUNTER — Other Ambulatory Visit: Payer: Self-pay | Admitting: Internal Medicine

## 2017-02-03 ENCOUNTER — Encounter (HOSPITAL_COMMUNITY): Payer: Self-pay | Admitting: Student

## 2017-02-03 DIAGNOSIS — J441 Chronic obstructive pulmonary disease with (acute) exacerbation: Secondary | ICD-10-CM | POA: Diagnosis not present

## 2017-02-03 LAB — BASIC METABOLIC PANEL
Anion gap: 8 (ref 5–15)
BUN: 39 mg/dL — AB (ref 6–20)
CO2: 26 mmol/L (ref 22–32)
Calcium: 8.9 mg/dL (ref 8.9–10.3)
Chloride: 103 mmol/L (ref 101–111)
Creatinine, Ser: 1.86 mg/dL — ABNORMAL HIGH (ref 0.61–1.24)
GFR calc Af Amer: 37 mL/min — ABNORMAL LOW (ref >60)
GFR, EST NON AFRICAN AMERICAN: 32 mL/min — AB (ref >60)
Glucose, Bld: 167 mg/dL — ABNORMAL HIGH (ref 65–99)
POTASSIUM: 3.8 mmol/L (ref 3.5–5.1)
Sodium: 137 mmol/L (ref 135–145)

## 2017-02-03 LAB — CBC
HEMATOCRIT: 40 % (ref 39.0–52.0)
Hemoglobin: 12.4 g/dL — ABNORMAL LOW (ref 13.0–17.0)
MCH: 27.7 pg (ref 26.0–34.0)
MCHC: 31 g/dL (ref 30.0–36.0)
MCV: 89.5 fL (ref 78.0–100.0)
Platelets: 191 10*3/uL (ref 150–400)
RBC: 4.47 MIL/uL (ref 4.22–5.81)
RDW: 15.3 % (ref 11.5–15.5)
WBC: 18.5 10*3/uL — ABNORMAL HIGH (ref 4.0–10.5)

## 2017-02-03 LAB — GLUCOSE, CAPILLARY
Glucose-Capillary: 122 mg/dL — ABNORMAL HIGH (ref 65–99)
Glucose-Capillary: 200 mg/dL — ABNORMAL HIGH (ref 65–99)

## 2017-02-03 MED ORDER — AZITHROMYCIN 500 MG PO TABS: 500.0000 mg | ORAL_TABLET | Freq: Every day | ORAL | 0 refills | Status: DC

## 2017-02-03 MED ORDER — ENOXAPARIN SODIUM 40 MG/0.4ML ~~LOC~~ SOLN: 40.0000 mg | SUBCUTANEOUS | Status: DC

## 2017-02-03 MED ORDER — PREDNISONE 10 MG PO TABS: ORAL_TABLET | ORAL | 0 refills | Status: DC

## 2017-02-03 NOTE — Progress Notes (Signed)
Patient was discharged to home by the student nurse supervised by the [facility] nursing instructor.  According to the instructor, went over the discharge instructions, medications, prescriptions, removed IV and escorted in w/c out of the unit till his ride.

## 2017-02-03 NOTE — Discharge Summary (Signed)
[facility] Discharge Summary  Patient name: Antonio Ray Medical record number: [id number] Date of birth: June 30, 1934 Age: 83 y.o. Gender: male Date of Admission: 02/02/2017  Date of Discharge: 02/03/17 Admitting Physician: Dickie La, MD  Primary Care Provider: [name], MD Consultants: Pulmonology   Indication for Hospitalization: SOB  Discharge Diagnoses/Problem List:  COPD exacerbation  Disposition: Home   Discharge Condition: Stable, improving   Discharge Exam: Please see progress note from day of discharge   Brief Hospital Course:  Antonio Ray is a 83 yo male with PMH significant for achalasia, COPD, HFrEF, short term memory loss, HLD, Type II DM, BPH. presenting to ED with SOB 2/2 COPD exacerbation. Patient presented was placed on 1 L of O2 in the ED and improved after albuterol neg, duoneb, and decadron. Patient continued to improve and was able to be weaned to room air at discharge. CXR showed hyperinflation consistent with COPD but no signs of infection. BNP of 108.5 at patients baseline (around 100). WBC count on admission was 13.3 and increased to 18.5, possibly secondary to steroid treatment. Patient showed improvement and was able to be weaned back to room air and was showing O2 sat of 93% on room air. Patient did ambulatory oximetry evaluation of 96% on room air at rest, and 93% on room air while ambulating. Patient was prescribed azithromycin 500 mg qd for total of 3 days and prednisone 50 mg qd on admission. Patient was discharged with remaining azithromycin and prednisone taper of 40 mg daily 1 day, 30 mg daily 1 day, 20 mg daily 2 days, 10 mg daily 2 days. Cr improved from 2.13 on admission to 1.86, with baseline of ~1.9.   Issues for Follow Up:  1. F/u with PCP regarding admission and COPD exacerbation  2. Continue prednisone taper: 40 mg daily 1 day, 30 mg daily 1 day, 20 mg daily 2 days, 10 mg daily 2 days 3. Continue  Azithromycin 500 mg qd to complete course of 3 days   Significant Procedures: none   Significant Labs and Imaging:   Recent Labs Lab 02/02/17 0901 02/03/17 0415  WBC 13.3* 18.5*  HGB 14.3 12.4*  HCT 46.3 40.0  PLT 182 191    Recent Labs Lab 02/02/17 0901 02/03/17 0415  NA 143 137  K 3.9 3.8  CL 106 103  CO2 26 26  GLUCOSE 155* 167*  BUN 42* 39*  CREATININE 2.13* 1.86*  CALCIUM 9.2 8.9  ALKPHOS 39  --   AST 32  --   ALT 22  --   ALBUMIN 3.6  --     DG Chest 2 View CLINICAL DATA:  Dyspnea EXAM: CHEST  2 VIEW COMPARISON:  11/24/2016 chest radiograph. FINDINGS: Stable configuration of 2 lead left subclavian ICD. Stable cardiomediastinal silhouette with top-normal heart size and aortic atherosclerosis. No pneumothorax. No pleural effusion. Hyperinflated lungs. No pulmonary edema. No acute consolidative airspace disease. IMPRESSION: 1. Hyperinflated lungs, suggesting COPD . 2. No acute cardiopulmonary disease. Electronically Signed   By: Ilona Sorrel M.D.   On: 02/02/2017 08:42  Results/Tests Pending at Time of Discharge: None   Discharge Medications:  Allergies as of 02/03/2017      Reactions   Codeine Other (See Comments)   Gets very angry, disoriented   Dilaudid [hydromorphone Hcl] Other (See Comments)   VERY AGITATED, HOSTILE   Flomax [tamsulosin Hcl] Shortness Of Breath   Morphine And Related Other (See Comments)   VERY AGITATED, HOSTILE  Sulfa Antibiotics Shortness Of Breath   Beta Adrenergic Blockers Other (See Comments)   ? disorientation   Carvedilol Other (See Comments)   DISORIENTATION      Medication List    TAKE these medications   albuterol 108 (90 Base) MCG/ACT inhaler Commonly known as:  PROVENTIL HFA;VENTOLIN HFA Inhale 2 puffs into the lungs every 6 (six) hours as needed for wheezing or shortness of breath.   aspirin 81 MG EC tablet Take 81 mg by mouth daily.   azithromycin 500 MG tablet Commonly known as:   ZITHROMAX Take 1 tablet (500 mg total) by mouth daily. Start taking on:  02/04/2017   BASAGLAR KWIKPEN 100 UNIT/ML Sopn INJECT 0.3 ML( 30 UNITS) UNDER THE SKIN EVERY MORNING What changed:  See the new instructions.   donepezil 10 MG tablet Commonly known as:  ARICEPT Take 1 tablet (10 mg total) by mouth every morning.   fenofibrate 145 MG tablet Commonly known as:  TRICOR TAKE 1 TABLET BY MOUTH EVERY DAY   fluoruracil 0.5 % cream Commonly known as:  CARAC Apply 1 application topically 2 (two) times daily.   furosemide 20 MG tablet Commonly known as:  LASIX Take 2 tablets (40 mg total) by mouth daily.   insulin aspart 100 UNIT/ML FlexPen Commonly known as:  NOVOLOG FLEXPEN Inject 5-6 Units into the skin See admin instructions. Inject 6 units SQ with breakfast, 5 units SQ with lunch and 5 units SQ with dinner What changed:  how much to take  additional instructions   loratadine 10 MG tablet Commonly known as:  CLARITIN Take 1 tablet (10 mg total) by mouth daily.   memantine 10 MG tablet Commonly known as:  NAMENDA Take one tablet twice daily. Please call [phone] for an appointment. What changed:  how much to take  how to take this  when to take this  additional instructions   nitroGLYCERIN 0.2 mg/hr patch Commonly known as:  NITRODUR - Dosed in mg/24 hr Place 1 patch (0.2 mg total) onto the skin daily. Alternates patch placement with odd/even days. Even days on left side, odd days on right side.   pantoprazole 20 MG tablet Commonly known as:  PROTONIX TAKE 1 TABLET BY MOUTH DAILY   pravastatin 40 MG tablet Commonly known as:  PRAVACHOL Take 1 tablet (40 mg total) by mouth daily. What changed:  when to take this   predniSONE 10 MG tablet Commonly known as:  DELTASONE 40mg  (4 tabs) on 02/04/2017.Then 30mg  (3 tabs) on 7/8,then 20mg  (2 tabs) for 2 days (on 7/9 and 7/10) then 10mg  (1 tab) for 2 days (7/11+7/12) Start taking on:  02/04/2017   PRESERVISION AREDS  2 Caps Take 1 capsule by mouth 2 (two) times daily.   SPIRIVA HANDIHALER 18 MCG inhalation capsule Generic drug:  tiotropium INHALE CONTENTS OF 1 CAPSULE VIA HANDIHALER ONCE DAILY   SYMBICORT 160-4.5 MCG/ACT inhaler Generic drug:  budesonide-formoterol INHALE 2 PUFFS BY MOUTH TWICE DAILY   Vitamin D 2000 units tablet Take 2,000 Units by mouth daily.       Discharge Instructions: Please refer to Patient Instructions section of EMR for full details.  Patient was counseled important signs and symptoms that should prompt return to medical care, changes in medications, dietary instructions, activity restrictions, and follow up appointments.   Follow-Up Appointments: Follow-up Information    [name], MD. Call in 2 day(s).   Specialty:  Family Medicine Contact information: [address] [place] [address]  [place], [place], DO 02/03/2017, 2:44 PM PGY-1, [place]

## 2017-02-03 NOTE — Care Management Obs Status (Signed)
[facility] NOTIFICATION   Patient Details  Name: Antonio Ray MRN: [id number] Date of Birth: January 03, 1934   Medicare Observation Status Notification Given:  Yes    Carles Collet, RN 02/03/2017, 1:36 PM

## 2017-02-03 NOTE — Plan of Care (Signed)
Problem: Pain Managment: Goal: General experience of comfort will improve Outcome: Progressing Pain assessed and monitored.  Patient denied any pain or discomfort.  No needs voiced.  Patient progressing towards goal.

## 2017-02-03 NOTE — Progress Notes (Cosign Needed)
Family Medicine Teaching Service Daily Progress Note Intern Pager: [phone]  Patient name: Antonio Ray Medical record number: [id number] Date of birth: 1933-09-14 Age: 83 y.o. Gender: male  Primary Care Provider: [name], MD Consultants: Pulmonary   Code Status: Full   Pt Overview and Major Events to Date:  Patient presented with COPD exacerbation and was admitted to [facility] on 02/03/17.   Assessment and Plan:  Antonio Ray is a 83 y.o. male presenting with COPD exacerbation. PMH is significant for achalasia, COPD, HFrEF, short term memory loss, HLD, Type II DM, BPH.   COPD exacerbation:  Patient presented with SOB and decreased O2 saturation with history of COPD. Was placed on 1 L O2 in ED. Patient is currently at 93% on RA. CXR on admission showed hyperinflation consistent with COPD but no signs of infection. WBC have elevated to 18.5, however afebrile. Likely due to steroid administration. BNP of 108.5 at patient's baseline (around 100), patient at dry weight, and no signs of fluid overload on exam, so CHF exacerbation less likely cause of SOB. Consider possible aspiration pneumonia given achalasia and history of multiple episodes of aspiration PNA, but unlikely due to negative chest xray. Dr. Lake Bells, pulmonology, saw patient on 7/5 and recommended that patient can be discharged on 7/6. Pulmonary recommended that patient continue current medication of dulera, symbicort, and duoneb, and continue oxygen to maintain O2 of 90-94%. Recommended ambulatory oximetry evaluation prior to discharge. Pulmonary will arrange for f/u in 1-2 weeks.  -Appreciate pulmonary recommendations  - Continuous pulse ox - Continue azithro 500mg  qd for total of 3 days  - Prednisone taper at discharge: 40 mg daily 1 day, 30 mg daily 1 day, 20 mg daily 2 days, 10 mg daily 2 days    - Continue home Symbicort, Spiriva, Claritin - Supplemental O2 as needed - Albuterol PRN wheezing -ambulatory pulse ox  prior to discharge  Achalasia Is followed as outpatient by Dr. Silverio Decamp. History of balloon dilatation. Has history of regurgitation of both solids and liquids. Will monitor for aspiration risk.  -Monitor diet and adjust as necessary   HFrEF Followed by Dr. [place] outpatient. Echo on 04/2016 showed LV EF of 20-25%. Is currently on 20 mg lasix at home. Currently at dry weight (between 181-183 lbs).  -Hold lasix due to elevated Cr - Monitor fluid status  Acute on chronic kidney disease Diagnosis of Stage 3 CKD. Creatinine elevated to 1.86, improved from 2.13 on 7/5. Baseline appears to be ~1.9.  -Continue to monitor -Hold lasix  - Encouraging PO intake rather than IVF given patient's history of CHF with EF 20%  T2DM Glucose of 122. On Lantus 18-20U in AM at home as well as Novolog mealtime coverage. Last A1C 7.9 in 10/2016. -Continue to monitor d/t steroid use -diabetic diet  -Sliding scale insulin  - Lantus 9U in AM - CBG qACHS  Short term memory loss Controlled with Namenda and Aricept. Patient lives along and functions independently, requiring only minimal help from his chidlren.  -Continue home medications   Hyperlipidemia -Continue home pravastatin and fenofibrate   FEN/GI: heart healthy/carb modified diet, Protonix PPx: Lovenox   Disposition: stable, improving  Subjective:  Antonio Ray is a 83 y.o. male presenting with SOB. Patient has a PMH significant for Achalasia, AICD placement in 2012, Angina, BPH, CHF, Class II-III systolic dysfunction, CKD stage 3, COPD, CAD with cath in 2007, GERD, Hyperlipidemia, T2DM, and MI. Patient today states he is doing better. Patient reports no  SOB, cough, or sputum production. Patient denies headache and dizziness. Nurse reported that he was able to walk down hallway twice without O2 supplementation with O2 sat of 93%. Patient states he was able to tolerate diet.   Objective: Temp:  [97.4 F (36.3 C)-98.6 F (37 C)] 97.6 F  (36.4 C) (07/06 0730) Pulse Rate:  [60-88] 68 (07/06 0730) Resp:  [16-20] 18 (07/06 0730) BP: (91-112)/(35-67) 112/54 (07/06 0730) SpO2:  [93 %-98 %] 93 % (07/06 0800) Weight:  [181 lb 10.5 oz (82.4 kg)-183 lb 6.8 oz (83.2 kg)] 183 lb 6.8 oz (83.2 kg) (07/05 2107) Physical Exam: General: Patient is a well appearing man sitting up in bed, NAD, awake and alert  Cardiovascular: RRR, no MRG, 2+ pulses bilaterally, normal capillary refill  Respiratory: clear to auscultation bilaterally, decreased breath sounds bilaterally  Abdomen: soft, non tender, non distended. Bowel sounds heard in all 4 quadrants  Extremities: normal grip strength bilaterally  Laboratory:  Recent Labs Lab 02/02/17 0901 02/03/17 0415  WBC 13.3* 18.5*  HGB 14.3 12.4*  HCT 46.3 40.0  PLT 182 191    Recent Labs Lab 02/02/17 0901 02/03/17 0415  NA 143 137  K 3.9 3.8  CL 106 103  CO2 26 26  BUN 42* 39*  CREATININE 2.13* 1.86*  CALCIUM 9.2 8.9  PROT 6.7  --   BILITOT 0.8  --   ALKPHOS 39  --   ALT 22  --   AST 32  --   GLUCOSE 155* 167*    Imaging/Diagnostic Tests: Dg Chest 2 View Result Date: 02/02/2017 CLINICAL DATA:  Dyspnea EXAM: CHEST  2 VIEW COMPARISON:  11/24/2016 chest radiograph. FINDINGS: Stable configuration of 2 lead left subclavian ICD. Stable cardiomediastinal silhouette with top-normal heart size and aortic atherosclerosis. No pneumothorax. No pleural effusion. Hyperinflated lungs. No pulmonary edema. No acute consolidative airspace disease. IMPRESSION: 1. Hyperinflated lungs, suggesting COPD . 2. No acute cardiopulmonary disease. Electronically Signed   By: Ilona Sorrel M.D.   On: 02/02/2017 08:42   Caroline More, DO 02/03/2017, 12:12 PM PGY-1, [place] Intern pager: [phone], text pages welcome

## 2017-02-03 NOTE — Progress Notes (Signed)
SATURATION QUALIFICATIONS: (This note is used to comply with regulatory documentation for home oxygen)  Patient Saturations on Room Air at Rest = 96%  Patient Saturations on Room Air while Ambulating = 93%  Patient tolerated ambulation well Zenaida Deed, Student-RN

## 2017-02-03 NOTE — Discharge Instructions (Signed)
Antonio Ray was admitted for COPD exacerbation. Patient was stable and improved on discharge. Continue azithromycin 500 mg for 2 more days.   Continue prednisone taper as instructed by pulmonary:  40 mg (4 tabs) on 02/04/2017.  Then 30 mg (3 tabs) on 02/05/2017, then 20 mg (2 tabs) for 2 days (on 7/9 and 7/10) followed by 10 mg (1 tab) for two days (7/11 and 7/12) to complete steroid taper.  Please make a follow up appointment with you PCP within 1 week.

## 2017-02-03 NOTE — Care Management Note (Signed)
Case Management Note  Patient Details  Name: HO PARISI MRN: [id number] Date of Birth: [birth year]  Subjective/Objective:                 Patient in observation will DC to home today. Chart reviewed at time of DC order, no CM consults, needs, orders, identified.    Action/Plan:  DC to home self care.  Expected Discharge Date:  02/03/17               Expected Discharge Plan:  Home/Self Care  In-House Referral:     Discharge planning Services  CM Consult  Post Acute Care Choice:    Choice offered to:     DME Arranged:    DME Agency:     HH Arranged:    HH Agency:     Status of Service:  Completed, signed off  If discussed at [employer] of Stay Meetings, dates discussed:    Additional Comments:  Carles Collet, RN 02/03/2017, 2:28 PM

## 2017-02-10 ENCOUNTER — Other Ambulatory Visit: Payer: Self-pay | Admitting: Neurology

## 2017-02-10 ENCOUNTER — Other Ambulatory Visit: Payer: Self-pay | Admitting: Cardiology

## 2017-02-15 ENCOUNTER — Inpatient Hospital Stay (HOSPITAL_COMMUNITY)
Admission: EM | Admit: 2017-02-15 | Discharge: 2017-02-17 | DRG: 690 | Disposition: A | Discharge status: Home / Self Care | Payer: Medicare Other | Attending: Family Medicine | Admitting: Family Medicine

## 2017-02-15 ENCOUNTER — Encounter (HOSPITAL_COMMUNITY): Payer: Self-pay | Admitting: Emergency Medicine

## 2017-02-15 ENCOUNTER — Other Ambulatory Visit: Payer: Self-pay

## 2017-02-15 ENCOUNTER — Emergency Department (HOSPITAL_COMMUNITY): Payer: Medicare Other

## 2017-02-15 DIAGNOSIS — E861 Hypovolemia: Secondary | ICD-10-CM | POA: Diagnosis present

## 2017-02-15 DIAGNOSIS — K220 Achalasia of cardia: Secondary | ICD-10-CM | POA: Diagnosis not present

## 2017-02-15 DIAGNOSIS — Z7951 Long term (current) use of inhaled steroids: Secondary | ICD-10-CM

## 2017-02-15 DIAGNOSIS — Z7982 Long term (current) use of aspirin: Secondary | ICD-10-CM

## 2017-02-15 DIAGNOSIS — N390 Urinary tract infection, site not specified: Secondary | ICD-10-CM

## 2017-02-15 DIAGNOSIS — Z885 Allergy status to narcotic agent status: Secondary | ICD-10-CM

## 2017-02-15 DIAGNOSIS — Z79899 Other long term (current) drug therapy: Secondary | ICD-10-CM

## 2017-02-15 DIAGNOSIS — N400 Enlarged prostate without lower urinary tract symptoms: Secondary | ICD-10-CM | POA: Diagnosis present

## 2017-02-15 DIAGNOSIS — Z9861 Coronary angioplasty status: Secondary | ICD-10-CM

## 2017-02-15 DIAGNOSIS — I2510 Atherosclerotic heart disease of native coronary artery without angina pectoris: Secondary | ICD-10-CM | POA: Diagnosis present

## 2017-02-15 DIAGNOSIS — Z882 Allergy status to sulfonamides status: Secondary | ICD-10-CM

## 2017-02-15 DIAGNOSIS — E785 Hyperlipidemia, unspecified: Secondary | ICD-10-CM | POA: Diagnosis present

## 2017-02-15 DIAGNOSIS — R402441 Other coma, without documented Glasgow coma scale score, or with partial score reported, in the field [EMT or ambulance]: Secondary | ICD-10-CM | POA: Diagnosis not present

## 2017-02-15 DIAGNOSIS — Z888 Allergy status to other drugs, medicaments and biological substances status: Secondary | ICD-10-CM

## 2017-02-15 DIAGNOSIS — R4182 Altered mental status, unspecified: Secondary | ICD-10-CM | POA: Diagnosis not present

## 2017-02-15 DIAGNOSIS — Z95810 Presence of automatic (implantable) cardiac defibrillator: Secondary | ICD-10-CM

## 2017-02-15 DIAGNOSIS — E1122 Type 2 diabetes mellitus with diabetic chronic kidney disease: Secondary | ICD-10-CM | POA: Diagnosis not present

## 2017-02-15 DIAGNOSIS — R413 Other amnesia: Secondary | ICD-10-CM | POA: Diagnosis present

## 2017-02-15 DIAGNOSIS — Z794 Long term (current) use of insulin: Secondary | ICD-10-CM

## 2017-02-15 DIAGNOSIS — I5042 Chronic combined systolic (congestive) and diastolic (congestive) heart failure: Secondary | ICD-10-CM | POA: Diagnosis not present

## 2017-02-15 DIAGNOSIS — R29818 Other symptoms and signs involving the nervous system: Secondary | ICD-10-CM | POA: Diagnosis not present

## 2017-02-15 DIAGNOSIS — N183 Chronic kidney disease, stage 3 (moderate): Secondary | ICD-10-CM | POA: Diagnosis not present

## 2017-02-15 DIAGNOSIS — I252 Old myocardial infarction: Secondary | ICD-10-CM

## 2017-02-15 DIAGNOSIS — K219 Gastro-esophageal reflux disease without esophagitis: Secondary | ICD-10-CM | POA: Diagnosis present

## 2017-02-15 DIAGNOSIS — Z87891 Personal history of nicotine dependence: Secondary | ICD-10-CM

## 2017-02-15 DIAGNOSIS — Z823 Family history of stroke: Secondary | ICD-10-CM

## 2017-02-15 DIAGNOSIS — Z85828 Personal history of other malignant neoplasm of skin: Secondary | ICD-10-CM

## 2017-02-15 DIAGNOSIS — R410 Disorientation, unspecified: Secondary | ICD-10-CM | POA: Diagnosis not present

## 2017-02-15 DIAGNOSIS — H9190 Unspecified hearing loss, unspecified ear: Secondary | ICD-10-CM | POA: Diagnosis present

## 2017-02-15 DIAGNOSIS — J438 Other emphysema: Secondary | ICD-10-CM | POA: Diagnosis present

## 2017-02-15 DIAGNOSIS — Z87440 Personal history of urinary (tract) infections: Secondary | ICD-10-CM

## 2017-02-15 DIAGNOSIS — I255 Ischemic cardiomyopathy: Secondary | ICD-10-CM | POA: Diagnosis present

## 2017-02-15 LAB — CBC
HEMATOCRIT: 42.8 % (ref 39.0–52.0)
Hemoglobin: 13.6 g/dL (ref 13.0–17.0)
MCH: 28 pg (ref 26.0–34.0)
MCHC: 31.8 g/dL (ref 30.0–36.0)
MCV: 88.1 fL (ref 78.0–100.0)
PLATELETS: 219 10*3/uL (ref 150–400)
RBC: 4.86 MIL/uL (ref 4.22–5.81)
RDW: 15.4 % (ref 11.5–15.5)
WBC: 25.2 10*3/uL — ABNORMAL HIGH (ref 4.0–10.5)

## 2017-02-15 LAB — I-STAT TROPONIN, ED: TROPONIN I, POC: 0.01 ng/mL (ref 0.00–0.08)

## 2017-02-15 LAB — URINALYSIS, ROUTINE W REFLEX MICROSCOPIC
Bilirubin Urine: NEGATIVE
Glucose, UA: 50 mg/dL — AB
Ketones, ur: NEGATIVE mg/dL
Nitrite: NEGATIVE
PROTEIN: 30 mg/dL — AB
SPECIFIC GRAVITY, URINE: 1.018 (ref 1.005–1.030)
pH: 5 (ref 5.0–8.0)

## 2017-02-15 LAB — DIFFERENTIAL
BASOS PCT: 0 %
Basophils Absolute: 0 10*3/uL (ref 0.0–0.1)
EOS PCT: 0 %
Eosinophils Absolute: 0 10*3/uL (ref 0.0–0.7)
LYMPHS ABS: 1.8 10*3/uL (ref 0.7–4.0)
Lymphocytes Relative: 7 %
MONO ABS: 1.5 10*3/uL — AB (ref 0.1–1.0)
Monocytes Relative: 6 %
NEUTROS ABS: 21.9 10*3/uL — AB (ref 1.7–7.7)
Neutrophils Relative %: 87 %

## 2017-02-15 LAB — COMPREHENSIVE METABOLIC PANEL
ALT: 23 U/L (ref 17–63)
AST: 27 U/L (ref 15–41)
Albumin: 3 g/dL — ABNORMAL LOW (ref 3.5–5.0)
Alkaline Phosphatase: 52 U/L (ref 38–126)
Anion gap: 11 (ref 5–15)
BILIRUBIN TOTAL: 0.8 mg/dL (ref 0.3–1.2)
BUN: 31 mg/dL — AB (ref 6–20)
CO2: 26 mmol/L (ref 22–32)
CREATININE: 2.17 mg/dL — AB (ref 0.61–1.24)
Calcium: 8.8 mg/dL — ABNORMAL LOW (ref 8.9–10.3)
Chloride: 101 mmol/L (ref 101–111)
GFR, EST AFRICAN AMERICAN: 31 mL/min — AB (ref >60)
GFR, EST NON AFRICAN AMERICAN: 27 mL/min — AB (ref >60)
Glucose, Bld: 190 mg/dL — ABNORMAL HIGH (ref 65–99)
POTASSIUM: 4 mmol/L (ref 3.5–5.1)
Sodium: 138 mmol/L (ref 135–145)
TOTAL PROTEIN: 6.8 g/dL (ref 6.5–8.1)

## 2017-02-15 LAB — I-STAT CHEM 8, ED
BUN: 35 mg/dL — ABNORMAL HIGH (ref 6–20)
CREATININE: 2.1 mg/dL — AB (ref 0.61–1.24)
Calcium, Ion: 1.01 mmol/L — ABNORMAL LOW (ref 1.15–1.40)
Chloride: 100 mmol/L — ABNORMAL LOW (ref 101–111)
GLUCOSE: 191 mg/dL — AB (ref 65–99)
HEMATOCRIT: 44 % (ref 39.0–52.0)
HEMOGLOBIN: 15 g/dL (ref 13.0–17.0)
Potassium: 4.1 mmol/L (ref 3.5–5.1)
Sodium: 139 mmol/L (ref 135–145)
TCO2: 26 mmol/L (ref 0–100)

## 2017-02-15 LAB — PROTIME-INR
INR: 1.13
PROTHROMBIN TIME: 14.6 s (ref 11.4–15.2)

## 2017-02-15 LAB — I-STAT CG4 LACTIC ACID, ED: LACTIC ACID, VENOUS: 0.77 mmol/L (ref 0.5–1.9)

## 2017-02-15 LAB — APTT: aPTT: 29 seconds (ref 24–36)

## 2017-02-15 MED ORDER — DEXTROSE 5 % IV SOLN: 1.0000 g | INTRAVENOUS | Status: DC

## 2017-02-15 MED ORDER — DEXTROSE 5 % IV SOLN
2.0000 g | Freq: Once | INTRAVENOUS | Status: AC
  Administered 2017-02-15: 2 g via INTRAVENOUS
  Filled 2017-02-15: qty 2

## 2017-02-15 MED ORDER — SODIUM CHLORIDE 0.9 % IV BOLUS (SEPSIS)
500.0000 mL | Freq: Once | INTRAVENOUS | Status: AC
  Administered 2017-02-15: 500 mL via INTRAVENOUS

## 2017-02-15 MED ORDER — ACETAMINOPHEN 325 MG PO TABS
650.0000 mg | ORAL_TABLET | Freq: Once | ORAL | Status: AC
  Administered 2017-02-15: 650 mg via ORAL
  Filled 2017-02-15: qty 2

## 2017-02-15 NOTE — ED Provider Notes (Signed)
[facility] DEPT Provider Note   CSN: [id number] Arrival date & time: 02/15/17  1721   An emergency department physician performed an initial assessment on this suspected stroke patient at 87.  History   Chief Complaint Chief Complaint  Patient presents with  . Code Stroke    LEVEL 5 CAVEAT - ALTERED MENTAL STATUS  HPI Antonio Antonio Ray is a 83 y.o. Antonio Ray.  HPI  83 year old Antonio Ray with a history of achalasia, COPD, CAD, CHF and type 2 diabetes presents with acute altered mental status. History of from EMS noted that the patient was acutely altered, last no normal earlier this morning. He was stumbling and seemed off balance. Brought in as a code stroke. I saw the patient after he returned from the [place] and his son is at the bedside. On tells me that this is very similar to multiple prior encephalopathies from febrile illness. He currently gets pneumonia and has had UTIs in the past. He is found to have a temperature of 101 here. Daughter last talked to the patient 6 today and he was normal. Patient endorses some dysuria since last night. He has a chronic cough but something to my be a little bit worse currently. He always has ataxia and altered mental status whenever he has a fever. The patient is mildly confused and otherwise continues no further history.  Past Medical History:  Diagnosis Date  . Achalasia   . AICD (automatic cardioverter/defibrillator) present 03/30/2011   Analyze ST study patient  . Anginal pain ([place])   . BPH (benign prostatic hyperplasia)   . CHF (congestive heart failure) ([place])   . Chronic systolic dysfunction of left ventricle    EF 30-35%, CLASS II - III SYMPTOMS; intolerant to Coreg  . CKD (chronic kidney disease) stage 3, GFR 30-59 ml/min 06/08/2012  . COPD (chronic obstructive pulmonary disease) ([place])   . Coronary artery disease    History of remote anterior MI with PCI to LAD in 2006; most recent cath 2007, no intervention required  . DOE  (dyspnea on exertion)    with heavy exertion  . Dysrhythmia   . Esophageal dysmotility   . GERD (gastroesophageal reflux disease)   . GERD (gastroesophageal reflux disease)   . Head injury, closed, with concussion 2000ish  . Heart murmur    hx  . HOH (hard of hearing)   . Hyperlipidemia   . Memory loss    improved  . Myocardial infarction ([place]) 1994   ANTERIOR  . Pneumonia "several times"   aspiration pna with at least 3 admits for this 2016.   Marland Kitchen PVC's (premature ventricular contractions)   . Renal failure   . Skin cancer "several"   "forearms; head"  . Type II diabetes mellitus [facility])     Patient Active Problem List   Diagnosis Date Noted  . Altered mental state 02/15/2017  . Achalasia of esophagus   . Encephalopathy   . CAP (community acquired pneumonia) 09/22/2016  . Fever chills   . SOB (shortness of breath)   . Acute respiratory failure with hypoxemia ([place])   . Acute respiratory failure ([place]) 04/21/2016  . Chronic combined systolic and diastolic congestive heart failure ([place]) 04/21/2016  . Memory loss 04/21/2016  . Allergic rhinitis 04/11/2016  . Chronic systolic CHF (congestive heart failure) ([place]) 10/30/2015  . Mild cognitive impairment 08/18/2015  . Difficulty in swallowing   . AKI (acute kidney injury) ([place])   . Type 2 diabetes mellitus with stage 3 chronic kidney disease,  with long-term current use of insulin ([place])   . Chronic systolic congestive heart failure ([place])   . Chronic kidney disease, stage III (moderate)   . Other specified hypotension   . CKD (chronic kidney disease), stage III   . Sepsis due to pneumonia ([place])   . Aspiration pneumonia due to food (regurgitated) ([place])   . Other emphysema ([place])   . Systolic CHF, acute on chronic ([name])   . AICD (automatic cardioverter/defibrillator) present   . CAD S/P percutaneous coronary angioplasty   . Acute respiratory failure with hypoxia ([place]) 04/03/2015  . Cardiomyopathy, ischemic 04/03/2015  . Elevated  troponin 04/03/2015  . Sepsis ([place]) 03/31/2015  . Blood poisoning   . COPD exacerbation ([place])   . HCAP (healthcare-associated pneumonia) and recurrent Aspiration Pneumonia 03/21/2015  . Aspiration into airway 03/21/2015  . Achalasia 03/21/2015  . Dysphagia   . Aspiration pneumonia ([place])   . Esophageal dysphagia   . Esophageal dysmotilities   . COPD with acute exacerbation ([place]) 01/14/2015  . Left ventricular apical thrombus 01/14/2015  . Acute on chronic systolic CHF (congestive heart failure) ([place]) 01/14/2015  . ARF (acute renal failure) ([place]) 01/14/2015  . Dysphagia, unspecified(787.20) 10/30/2013  . COPD GOLD IV 10/07/2013  . Influenza with pneumonia 09/11/2013  . Leukocytosis 09/11/2013  . Acute encephalopathy 09/10/2013  . Renal failure (ARF), acute on chronic ([name]) 09/10/2013  . Automatic implantable cardioverter-defibrillator in situ 07/06/2012  . CKD (chronic kidney disease) stage 3, GFR 30-59 ml/min 06/08/2012  . GERD (gastroesophageal reflux disease) 09/26/2011  . BPH (benign prostatic hyperplasia) 08/22/2011  . COPD (chronic obstructive pulmonary disease) ([place]) 07/17/2011  . S/P ICD (internal cardiac defibrillator) procedure 04/20/2011  . Coronary artery disease   . Sick sinus syndrome ([place])   . Acute on chronic combined systolic and diastolic congestive heart failure, NYHA class 3 ([place])   . Hyperlipidemia   . PVC's (premature ventricular contractions)     Past Surgical History:  Procedure Laterality Date  . BALLOON DILATION N/A 09/09/2015   Procedure: BALLOON DILATION;  Surgeon: Mauri Pole, MD;  Location: [facility];  Service: Endoscopy;  Laterality: N/A;  rigiflex achalasia balloon dilators  . BOTOX INJECTION N/A 04/08/2015   Procedure: BOTOX INJECTION;  Surgeon: Milus Banister, MD;  Location: [facility];  Service: Endoscopy;  Laterality: N/A;  . CARDIAC CATHETERIZATION  01/11/2006   DEMONSTRATES AKINESIA OF THE DISTAL ANTERIOR WALL, DISTAL INFERIOR WALL  AND AKINESIA OF THE APEX. THE BASAL SEGMENTS CONTRACT WELL AND OVERALL EF 35%  . CATARACT EXTRACTION W/ INTRAOCULAR LENS  IMPLANT, BILATERAL  08/2014-09/2014  . CHOLECYSTECTOMY  2/11  . CORONARY ANGIOPLASTY  1994   TO THE LAD  . ESOPHAGEAL MANOMETRY N/A 03/16/2015   Procedure: ESOPHAGEAL MANOMETRY (EM);  Surgeon: Jerene Bears, MD;  Location: [facility];  Service: Gastroenterology;  Laterality: N/A;  . ESOPHAGOGASTRODUODENOSCOPY N/A 04/08/2015   Procedure: ESOPHAGOGASTRODUODENOSCOPY (EGD);  Surgeon: Milus Banister, MD;  Location: [facility];  Service: Endoscopy;  Laterality: N/A;  . ESOPHAGOGASTRODUODENOSCOPY (EGD) WITH PROPOFOL N/A 09/09/2015   Procedure: ESOPHAGOGASTRODUODENOSCOPY (EGD) WITH PROPOFOL;  Surgeon: Mauri Pole, MD;  Location: [facility];  Service: Endoscopy;  Laterality: N/A;  pt. is to have gastrografin xray post recovery-do not discharge until results are back  . FOOT FRACTURE SURGERY Right 1980's  . INSERT / REPLACE / REMOVE PACEMAKER    . SKIN CANCER EXCISION  "several"   "forearms, head"       Home Medications    Prior to  Admission medications   Medication Sig Start Date End Date Taking? Authorizing Provider  albuterol (PROVENTIL HFA;VENTOLIN HFA) 108 (90 Base) MCG/ACT inhaler Inhale 2 puffs into the lungs every 6 (six) hours as needed for wheezing or shortness of breath. 04/22/16  Yes Eugenie Filler, MD  aspirin 81 MG EC tablet Take 81 mg by mouth daily.     Yes [provider]  Cholecalciferol (VITAMIN D) 2000 units tablet Take 2,000 Units by mouth daily.    Yes [provider]  donepezil (ARICEPT) 10 MG tablet Take 1 tablet (10 mg total) by mouth every morning. 08/18/15  Yes Marcial Pacas, MD  fenofibrate (TRICOR) 145 MG tablet TAKE 1 TABLET BY MOUTH EVERY DAY Patient taking differently: Take 145 mg by mouth once a day 02/10/17  Yes Martinique, Peter M, MD  fluoruracil [facility]) 0.5 % cream Apply 1 application topically 2 (two) times daily. TO  AFFECTED AREA 01/12/17  Yes [provider]  furosemide (LASIX) 20 MG tablet Take 2 tablets (40 mg total) by mouth daily. 11/22/16  Yes Martinique, Peter M, MD  insulin aspart (NOVOLOG FLEXPEN) 100 UNIT/ML FlexPen Inject 5-6 Units into the skin See admin instructions. Inject 6 units SQ with breakfast, 5 units SQ with lunch and 5 units SQ with dinner Patient taking differently: Inject 5-7 Units into the skin See admin instructions. Per sliding scale and depending on meal consumption: 6 units into the skin with breakfast then 5 units with lunch then 5 units with dinner (evening meal) 09/22/16  Yes Patrecia Pour, Christean Grief, MD  Insulin Glargine (BASAGLAR KWIKPEN) 100 UNIT/ML SOPN INJECT [place] MORNING Patient taking differently: Inject 22-30 units into the skin every morning 02/03/17  Yes Philemon Kingdom, MD  loratadine (CLARITIN) 10 MG tablet Take 1 tablet (10 mg total) by mouth daily. 07/02/15  Yes Barton Dubois, MD  memantine (NAMENDA) 10 MG tablet Take one tablet twice daily. Please call [phone] for an appointment. Patient taking differently: Take 10 mg by mouth 2 (two) times daily.  08/22/16  Yes Marcial Pacas, MD  Multiple Vitamins-Minerals (PRESERVISION AREDS 2) CAPS Take 1 capsule by mouth 2 (two) times daily.   Yes [provider]  nitroGLYCERIN (NITRODUR - DOSED IN MG/24 HR) 0.2 mg/hr patch Place 1 patch (0.2 mg total) onto the skin daily. Alternates patch placement with odd/even days. Even days on left side, odd days on right side. 12/15/16  Yes Martinique, Peter M, MD  pantoprazole (PROTONIX) 20 MG tablet TAKE 1 TABLET BY MOUTH DAILY Patient taking differently: Take 20 mg by mouth once a day 11/14/16  Yes Nandigam, Venia Minks, MD  pravastatin (PRAVACHOL) 40 MG tablet Take 1 tablet (40 mg total) by mouth daily. Patient taking differently: Take 40 mg by mouth at bedtime.  11/01/16  Yes Martinique, Peter M, MD  SPIRIVA HANDIHALER 18 MCG inhalation capsule INHALE CONTENTS OF 1  CAPSULE VIA HANDIHALER ONCE DAILY 09/05/16  Yes Juanito Doom, MD  SYMBICORT 160-4.5 MCG/ACT inhaler INHALE 2 PUFFS BY MOUTH TWICE DAILY 12/12/16  Yes Juanito Doom, MD  azithromycin (ZITHROMAX) 500 MG tablet Take 1 tablet (500 mg total) by mouth daily. Patient not taking: Reported on 02/15/2017 02/04/17   Caroline More, DO  predniSONE (DELTASONE) 10 MG tablet 65m (4 tabs) on 02/04/2017.Then 337m(3 tabs) on 7/8,then 2036m2 tabs) for 2 days (on 7/9 and 7/10) then 81m82m tab) for 2 days (7/11+7/12) Patient not taking: Reported on 02/15/2017 02/04/17   AbraTammi Klippel  Sherin, DO    Family History Family History  Problem Relation Age of Onset  . Stroke Father        Mother history unknown - never met her  . Colon cancer Neg Hx     Social History Social History  Substance Use Topics  . Smoking status: Former Smoker    Packs/day: 1.00    Years: 35.00    Types: Cigarettes    Quit date: 08/01/1992  . Smokeless tobacco: Never Used  . Alcohol use No     Allergies   Codeine; Dilaudid [hydromorphone hcl]; Flomax [tamsulosin hcl]; Morphine and related; Sulfa antibiotics; Beta adrenergic blockers; and Carvedilol   Review of Systems Review of Systems  Unable to perform ROS: Mental status change     Physical Exam Updated Vital Signs BP (!) 103/47   Pulse (!) 59   Temp 98 F (36.7 C) (Oral)   Resp (!) 23   SpO2 94%   Physical Exam  Constitutional: He appears well-developed and well-nourished.  HENT:  Head: Normocephalic and atraumatic.  Right Ear: External ear normal.  Left Ear: External ear normal.  Nose: Nose normal.  Eyes: Pupils are equal, round, and reactive to light. EOM are normal. Right eye exhibits no discharge. Left eye exhibits no discharge.  Neck: Normal range of motion. Neck supple.  Cardiovascular: Normal rate, regular rhythm and normal heart sounds.   Pulmonary/Chest: Effort normal. No accessory muscle usage. No tachypnea. He has decreased breath sounds in the  right lower field and the left lower field.  Abdominal: Soft. There is no tenderness.  Musculoskeletal: He exhibits no edema.  Neurological: He is alert. He is disoriented.  Awake, alert, but confused. Knows person and place. Thinks its Monday instead of Wednesday. Knows it's middle of June but doesn't state a year when asked, keeps trying to answer month. CN 3-12 grossly intact. 5/5 strength in all 4 extremities. Grossly normal sensation. Unable to do finger to nose, he touches nose and then stops and is confused.  Skin: Skin is warm and dry.  Nursing note and vitals reviewed.    ED Treatments / Results  Labs (all labs ordered are listed, but only abnormal results are displayed) Labs Reviewed  CBC - Abnormal; Notable for the following:       Result Value   WBC 25.2 (*)    All other components within normal limits  DIFFERENTIAL - Abnormal; Notable for the following:    Neutro Abs 21.9 (*)    Monocytes Absolute 1.5 (*)    All other components within normal limits  COMPREHENSIVE METABOLIC PANEL - Abnormal; Notable for the following:    Glucose, Bld 190 (*)    BUN 31 (*)    Creatinine, Ser 2.17 (*)    Calcium 8.8 (*)    Albumin 3.0 (*)    GFR calc non Af Amer 27 (*)    GFR calc Af Amer 31 (*)    All other components within normal limits  URINALYSIS, ROUTINE W REFLEX MICROSCOPIC - Abnormal; Notable for the following:    Color, Urine AMBER (*)    APPearance HAZY (*)    Glucose, UA 50 (*)    Hgb urine dipstick MODERATE (*)    Protein, ur 30 (*)    Leukocytes, UA MODERATE (*)    Bacteria, UA FEW (*)    Squamous Epithelial / LPF 0-5 (*)    All other components within normal limits  I-STAT CHEM 8, ED - Abnormal; Notable for  the following:    Chloride 100 (*)    BUN 35 (*)    Creatinine, Ser 2.10 (*)    Glucose, Bld 191 (*)    Calcium, Ion 1.01 (*)    All other components within normal limits  CULTURE, BLOOD (ROUTINE X 2)  URINE CULTURE  CULTURE, BLOOD (ROUTINE X 2) W REFLEX  TO ID PANEL  PROTIME-INR  APTT  I-STAT TROPONIN, ED  CBG MONITORING, ED  I-STAT CG4 LACTIC ACID, ED  I-STAT CG4 LACTIC ACID, ED    EKG  EKG Interpretation  Date/Time:  Wednesday February 15 2017 17:54:08 EDT Ventricular Rate:  95 PR Interval:    QRS Duration: 152 QT Interval:  383 QTC Calculation: 482 R Axis:   -77 Text Interpretation:  Sinus rhythm Ventricular premature complex Nonspecific IVCD with LAD Left ventricular hypertrophy Anterior Q waves, possibly due to LVH no significant change compared to February 02 2017 Confirmed by Sherwood Gambler [phone]) on 02/15/2017 7:10:43 PM       Radiology Dg Chest Port 1 View  Result Date: 02/15/2017 CLINICAL DATA:  Unsteady gait with abnormal speech and confusion. EXAM: PORTABLE CHEST 1 VIEW COMPARISON:  02/02/2017 FINDINGS: Dual lead pacer with lead tips projecting over the expected locations of the right atrium and ventricle. Atherosclerotic calcification of the aortic arch. Mild biapical pleuroparenchymal scarring. The lungs appear otherwise clear. Heart size within normal limits. IMPRESSION: 1.  No active cardiopulmonary disease is radiographically apparent. 2.  Aortic Atherosclerosis (ICD10-I70.0). Electronically Signed   By: Van Clines M.D.   On: 02/15/2017 18:48   Ct Head Code Stroke W/o Cm  Result Date: 02/15/2017 CLINICAL DATA:  Code stroke. 83 y/o M; aphasia and altered mental status. EXAM: CT HEAD WITHOUT CONTRAST TECHNIQUE: Contiguous axial images were obtained from the base of the skull through the vertex without intravenous contrast. COMPARISON:  None. FINDINGS: Brain: No evidence of acute infarction, hemorrhage, hydrocephalus, extra-axial collection or mass lesion/mass effect. Mild chronic microvascular ischemic changes and parenchymal volume loss of the brain. Vascular: Calcific atherosclerosis of carotid siphons. No hyperdense vessel identified. Skull: Normal. Negative for fracture or focal lesion. Sinuses/Orbits: No acute  finding. Other: None. ASPECTS [facility] Stroke Program Early CT Score) - Ganglionic level infarction (caudate, lentiform nuclei, internal capsule, insula, M1-M3 cortex): 7 - Supraganglionic infarction (M4-M6 cortex): 3 Total score (0-10 with 10 being normal): 10 IMPRESSION: 1. No acute intracranial abnormality identified. 2. ASPECTS is 10 3. Mild for age chronic microvascular ischemic changes and mild parenchymal volume loss of the brain. These results were called by telephone at the time of interpretation on 02/15/2017 at 5:42 pm to Dr. Leonel Ramsay, who verbally acknowledged these results. Electronically Signed   By: Kristine Garbe M.D.   On: 02/15/2017 17:47    Procedures Procedures (including critical care time)  Medications Ordered in ED Medications  cefTRIAXone (ROCEPHIN) 1 g in dextrose 5 % 50 mL IVPB (not administered)  cefTRIAXone (ROCEPHIN) 2 g in dextrose 5 % 50 mL IVPB (0 g Intravenous Stopped 02/15/17 2000)  acetaminophen (TYLENOL) tablet 650 mg (650 mg Oral Given 02/15/17 1940)  sodium chloride 0.9 % bolus 500 mL (500 mLs Intravenous New Bag/Given 02/15/17 2336)     Initial Impression / Assessment and Plan / ED Course  I have reviewed the triage vital signs and the nursing notes.  Pertinent labs & imaging results that were available during my care of the patient were reviewed by me and considered in my medical decision making (see chart for details).  Code stroke was cancelled given fever and likely infectious source. Family states this type of delirium/ataxia is c/w multiple prior episodes. Appears to have UTI. Given IV rocephin. Improved with tylenol. Will give some gentle fluids. No signs of severe sepsis/septic shock. Admit for IV antibiotics. Family practice to admit.  Final Clinical Impressions(s) / ED Diagnoses   Final diagnoses:  Delirium  Acute UTI    New Prescriptions New Prescriptions   No medications on file     Sherwood Gambler, MD 02/16/17  [phone]

## 2017-02-15 NOTE — Consult Note (Signed)
Neurology Consultation Reason for Consult: Altered Mental Status Referring Physician: Regenia Skeeter, S  CC: Altered Mental Status  History is obtained from:family  HPI: Antonio Ray is a 83 y.o. male last in his normal state of health when talking to his daughter on the phone. Shortly before arrival, a neighbor came over and found him to be confused. He was therefore brought into the emergency department as a code stroke.  Of note, son notes that he was complaining of chills overnight. He also becomes very disoriented anytime he has a fever.  On arrival to the emergency department, he was evaluated as a code stroke, he had no acute findings on CT. He is confused, but no other clear focal findings that he gives inconsistent answers to since.  He denies headache.   LKW: 12:45pm tpa given?: no, mild symptoms    ROS: A 14 point ROS was performed and is negative except as noted in the HPI.   Past Medical History:  Diagnosis Date  . Achalasia   . AICD (automatic cardioverter/defibrillator) present 03/30/2011   Analyze ST study patient  . Anginal pain ([place])   . BPH (benign prostatic hyperplasia)   . CHF (congestive heart failure) ([place])   . Chronic systolic dysfunction of left ventricle    EF 30-35%, CLASS II - III SYMPTOMS; intolerant to Coreg  . CKD (chronic kidney disease) stage 3, GFR 30-59 ml/min 06/08/2012  . COPD (chronic obstructive pulmonary disease) ([place])   . Coronary artery disease    History of remote anterior MI with PCI to LAD in 2006; most recent cath 2007, no intervention required  . DOE (dyspnea on exertion)    with heavy exertion  . Dysrhythmia   . Esophageal dysmotility   . GERD (gastroesophageal reflux disease)   . GERD (gastroesophageal reflux disease)   . Head injury, closed, with concussion 2000ish  . Heart murmur    hx  . HOH (hard of hearing)   . Hyperlipidemia   . Memory loss    improved  . Myocardial infarction ([place]) 1994   ANTERIOR  . Pneumonia  "several times"   aspiration pna with at least 3 admits for this 2016.   Marland Kitchen PVC's (premature ventricular contractions)   . Renal failure   . Skin cancer "several"   "forearms; head"  . Type II diabetes mellitus ([name])      Family History  Problem Relation Age of Onset  . Stroke Father        Mother history unknown - never met her  . Colon cancer Neg Hx      Social History:  reports that he quit smoking about 24 years ago. His smoking use included Cigarettes. He has a 35.00 pack-year smoking history. He has never used smokeless tobacco. He reports that he does not drink alcohol or use drugs.   Exam: Current vital signs: Vitals:   02/15/17 1748  BP: 131/61  Pulse: 97  Resp: 16  Temp: (!) 101.9 F (38.8 C)    Vital signs in last 24 hours:     Physical Exam  Constitutional: Appears well-developed and well-nourished.  Psych: Affect appropriate to situation Eyes: No scleral injection HENT: No OP obstrucion Head: Normocephalic.  Cardiovascular: Normal rate and regular rhythm.  Respiratory: Effort normal and breath sounds normal to anterior ascultation GI: Soft.  No distension. There is no tenderness.  Skin: WDI  Neuro: Mental Status: Patient is awake, alert, Gives correct month but not age. Cranial Nerves: II: Visual Fields are  very difficult to test as patient gives consistently wrong answers, but he does blink to threat bilaterally. Pupils are equal, round, and reactive to light.   III,IV, VI: EOMI without ptosis or diploplia.  V: Facial sensation is symmetric to temperature VII: Facial movement is symmetric.  VIII: hearing is intact to voice X: Uvula elevates symmetrically XI: Shoulder shrug is symmetric. XII: tongue is midline without atrophy or fasciculations.  Motor: He is able to hold all extremities off the bed without drift Sensory: Endorses symmetric sensation, does not extinguish Cerebellar: He has a prominent enhanced physiological tremor  bilaterally  No meningismus.  I have reviewed labs in epic and the results pertinent to this consultation are: Chem 8-creatinine 2.1  I have reviewed the images obtained: CT head-no acute findings  Impression: 83 year old male with likely delirium in the setting of febrile illness. He does not have any meningismus or headache to suggest CNS infection. With his history of confusion with any febrile illness, I think that this is much more likely than acute stroke. His symptoms are on the milder side and therefore I would not recommend IV TPA at this time. I have low suspicion for stroke given that this is a goal of him with febrile illness and he is febrile.  Recommendations: 1) consider MRI if he does not return to baseline after treatment. 2) continue Aricept and Namenda. 3) please call with further questions or concerns.  Roland Rack, MD [facility] [phone]  If 7pm- 7am, please page neurology on call as listed in [place].

## 2017-02-15 NOTE — H&P (Signed)
[facility] Admission History and Physical Service Pager: [phone]  Patient name: Antonio Ray Medical record number: [id number] Date of birth: 18-Mar-1934 Age: 83 y.o. Gender: male  Primary Care Provider: [name], MD Consultants: Neurology Code Status: Full  Chief Complaint: altered mental status  Assessment and Plan: Antonio Ray is a 83 y.o. male w/ PMH of achalasia, COPD, HFrEF, short term memory loss, HLD, Type II DM, BPH presenting with one day history of altered mental status. Was last heard and seen normal around noon on 7/19. Found confused with no pants on, and unable to ambulate at 1630 on 7/19. Brought to emergency department for code stroke. CT scan with no intracranial abnormalities. Patient presumed to have altered mental status 2/2 uti as detailed below.  Altered Mental Status Patient presents with altered mental status beginning in pm of 7/18. Has been having burning urination overnight of 7/17. CT scan (-) for any intracranial bleed, mass, or abnormality. WBC of 25, ua with leuks/bacteria. Febrile to 102.6 in ED. Received 1 dose of rocephin in ed. Now Aox3, but some slight drowsiness and slowness with responses. It seems very likely that the altered mental status is 2/2 to a uti, especially given similar symptoms with uti in the past. Other possible etiologies are stroke, seizures, electrolyte abnormalities, medications. These are all very unlikely given time course, prior tests/imaging, and history. CXR also negative for infiltrate or consolidation that would be concerning for PNA. Patient has not had any new cough or SOB. Will plan to continue to treat uti with iv antibiotics until culture sensitivities result. Neurology evaluated patient in ED and felt that his presentation was most consistent with infectious etiology given fever. They were not concerned for CNS infection.  - admit to floor for observation, admit to Dr. Ardelia Ray - vital  per floor routine - follow up urine culture - follow up blood culture - continue rocephin for now, likely switch to po when culture returns/afebrile - follow fever curve - neuro checks q 4 hours - follow up am cbc/bmp -appreciated neurology recs; MRI if mental status not improved    Leukocytosis: Has been off Prednisone taper for 1.5 weeks. Had WBC of 18 on last admission discharge on 7/8 due to prednisone for COPD exacerbation. Patient with wbc and bacteria on ua. Chest xray with no areas consistent with pneumonia. Patient with burning on urination. Blood cultures taken in emergency department. Received 1 dose of rocephin in er. Given that patient has evidence of infection on UA, has had improvement on antibiotics in er it is likely that leukocytosis is due to infection. Most likely source is uti. Will follow blood cultures during hospital admission. - Monitor fever course - follow mental status - Received one dose of rochephin, will continue until culture/sensitivities return - follow blood cultures - follow urine cultures  Chronic Kidney Disease  Creatinine of 2.17 on admission, creatinine of 1.86 on admission on 7/6. Will watch fluid status carefully. NPO due to altered mental status. Given low bp and high HR on admission likely hypovolemic on presentation. Will plan on gentle iv hydration in setting of npo, and hold home lasix. - NS 50 mL/hr for 12 hours - hold home lasix - npo for now, will change when passes swallow test   Type II diabetes Mellitus Patient with glucose of 190 on admission, and hgb A1c of 7.9 at last check in April 2018. Takes insulin novolog 6 u breakfast, 5u lunch/dinner. Takes lantus 30U in  morning. Recent history of steroid use and likely infection. Will start diabetic diet when tolerating PO. Will put on SSI while in hospital and give lantus 10U in am. - diabetic diet - hgb a1c - blood glucose with daily bmp - fingerstick with meals and at  night  COPD Recently hospitalized with copd exacerbation on 7/6. Was discharged on steroid taper and 3 days of azithro. Since discharge patient has been doing very well from a respiratory standpoint. Will plan to continue home spiriva, symbicort, albuterol - Will restart home spiriva, symbicort, albuterol  HErEF: Followed as an outpatient by dr. Donneta Ray Last echo on 04/2016 showed EF of 20-25%. Currently takes 46m lasix at home. Dry weight 181-183. Weight this AM 179 lbs.  - Will hold lasix - Monitor Fluid status - Will do gentle iv resuscitation given   Hyperlipidemia Will continue home statin when patient tolerating PO - continue pravastatin -holding home fenofibrate due to CrCl <30    Short Term Memory Loss Currently taking Namenda and Aricept. Lives alone and functions independently requiring only minimal help from children for ADLs. - Will continue namenda, aricept - Will monitor mental status - Likely ok for dc home when AMS resolves pending clinical improvement - OT/PT to assess ambulatory status  Achalasia? Is followed as an outpatient by Dr. NSilverio Ray Has a history of balloon dilatation. Regurgitates both solids and liquids. Monitor for aspiration risk. Speech to evaluate for swallow study in setting of AMS. - Will keep NPO for now and adjust as necessary - Can restart after swallow study  BPH Has diagnosis of bph but all meds make him dizzy per son. Will continue to monitor for s/s of retention.    FEN/GI: npo, protonix Prophylaxis: lovenox  Disposition: home vs rehab  History of Present Illness:  Antonio Ray a 83y.o. male presenting with altered mental status. He was last seen at his baseline on the morning of 7/18. He was found by family to be stumbling and seemed to be unable to keep his balance. EMS was called at that time and he was brought to the hospital. Per reports the patient had been having burning with urination over the past couple of days prior  to admission. Per history from the family, the patient has a history of becoming acutely altered with gait abnormalities when suffering from a febrile illness and has a history of UTI's in the past.  Son at bedside reports that he talked to him earlier today at 12:45 and patient was appropriate. Son called again at 4:30 and he didn't answer. Neighbor went to check on him and patient was sitting in house without pants on. Wasn't oriented enough to answer phone. EMS was called. T101 in EMS. He is more sleepy than usual which son says happens when he was sick. Patient told son last night that he had a "cold shiver" and that he also had burning with urination. Son does feel that overall mental status has improved since presentation.  He was brought in as a code stroke. A CT scan was performed which revealed no intracranial abnormality, with mild chronic microvascular changes. At presentation he was found to be febrile up to 101.9. BP was 131/61 at admission, with a heart rate of 97. His WBC was 25.2. Other notable labs on presentation were cr of 2.17, bun 31, gluc 191, ca 8.8, albumin of 3.0. A urinalysis was collected which revealed few bacteria, and moderate leukocytes. Patient AOx3, albeit slower to respond than usual  during exam.     Review Of Systems: Per HPI with the following additions:  Review of Systems  Constitutional: Positive for chills.  Respiratory: Negative for cough and shortness of breath.   Gastrointestinal: Negative for abdominal pain, constipation, diarrhea, nausea and vomiting.  Genitourinary: Positive for dysuria.  Neurological: Positive for weakness.    Patient Active Problem List   Diagnosis Date Noted  . Achalasia of esophagus   . Encephalopathy   . CAP (community acquired pneumonia) 09/22/2016  . Fever chills   . SOB (shortness of breath)   . Acute respiratory failure with hypoxemia ([place])   . Acute respiratory failure ([place]) 04/21/2016  . Chronic combined systolic and  diastolic congestive heart failure ([place]) 04/21/2016  . Memory loss 04/21/2016  . Allergic rhinitis 04/11/2016  . Chronic systolic CHF (congestive heart failure) ([place]) 10/30/2015  . Mild cognitive impairment 08/18/2015  . Difficulty in swallowing   . AKI (acute kidney injury) ([place])   . Type 2 diabetes mellitus with stage 3 chronic kidney disease, with long-term current use of insulin ([place])   . Chronic systolic congestive heart failure ([place])   . Chronic kidney disease, stage III (moderate)   . Other specified hypotension   . CKD (chronic kidney disease), stage III   . Sepsis due to pneumonia ([place])   . Aspiration pneumonia due to food (regurgitated) ([place])   . Other emphysema ([place])   . Systolic CHF, acute on chronic ([name])   . AICD (automatic cardioverter/defibrillator) present   . CAD S/P percutaneous coronary angioplasty   . Acute respiratory failure with hypoxia ([place]) 04/03/2015  . Cardiomyopathy, ischemic 04/03/2015  . Elevated troponin 04/03/2015  . Sepsis ([place]) 03/31/2015  . Blood poisoning   . COPD exacerbation ([place])   . HCAP (healthcare-associated pneumonia) and recurrent Aspiration Pneumonia 03/21/2015  . Aspiration into airway 03/21/2015  . Achalasia 03/21/2015  . Dysphagia   . Aspiration pneumonia ([place])   . Esophageal dysphagia   . Esophageal dysmotilities   . COPD with acute exacerbation ([place]) 01/14/2015  . Left ventricular apical thrombus 01/14/2015  . Acute on chronic systolic CHF (congestive heart failure) ([place]) 01/14/2015  . ARF (acute renal failure) ([place]) 01/14/2015  . Dysphagia, unspecified(787.20) 10/30/2013  . COPD GOLD IV 10/07/2013  . Influenza with pneumonia 09/11/2013  . Leukocytosis 09/11/2013  . Acute encephalopathy 09/10/2013  . Renal failure (ARF), acute on chronic ([name]) 09/10/2013  . Automatic implantable cardioverter-defibrillator in situ 07/06/2012  . CKD (chronic kidney disease) stage 3, GFR 30-59 ml/min 06/08/2012  . GERD (gastroesophageal reflux  disease) 09/26/2011  . BPH (benign prostatic hyperplasia) 08/22/2011  . COPD (chronic obstructive pulmonary disease) ([place]) 07/17/2011  . S/P ICD (internal cardiac defibrillator) procedure 04/20/2011  . Coronary artery disease   . Sick sinus syndrome ([place])   . Acute on chronic combined systolic and diastolic congestive heart failure, NYHA class 3 ([place])   . Hyperlipidemia   . PVC's (premature ventricular contractions)     Past Medical History: Past Medical History:  Diagnosis Date  . Achalasia   . AICD (automatic cardioverter/defibrillator) present 03/30/2011   Analyze ST study patient  . Anginal pain ([place])   . BPH (benign prostatic hyperplasia)   . CHF (congestive heart failure) ([place])   . Chronic systolic dysfunction of left ventricle    EF 30-35%, CLASS II - III SYMPTOMS; intolerant to Coreg  . CKD (chronic kidney disease) stage 3, GFR 30-59 ml/min 06/08/2012  . COPD (chronic obstructive pulmonary disease) ([place])   .  Coronary artery disease    History of remote anterior MI with PCI to LAD in 2006; most recent cath 2007, no intervention required  . DOE (dyspnea on exertion)    with heavy exertion  . Dysrhythmia   . Esophageal dysmotility   . GERD (gastroesophageal reflux disease)   . GERD (gastroesophageal reflux disease)   . Head injury, closed, with concussion 2000ish  . Heart murmur    hx  . HOH (hard of hearing)   . Hyperlipidemia   . Memory loss    improved  . Myocardial infarction ([place]) 1994   ANTERIOR  . Pneumonia "several times"   aspiration pna with at least 3 admits for this 2016.   Marland Kitchen PVC's (premature ventricular contractions)   . Renal failure   . Skin cancer "several"   "forearms; head"  . Type II diabetes mellitus ([place])     Past Surgical History: Past Surgical History:  Procedure Laterality Date  . BALLOON DILATION N/A 09/09/2015   Procedure: BALLOON DILATION;  Surgeon: Mauri Pole, MD;  Location: [facility];  Service: Endoscopy;  Laterality: N/A;   rigiflex achalasia balloon dilators  . BOTOX INJECTION N/A 04/08/2015   Procedure: BOTOX INJECTION;  Surgeon: Milus Banister, MD;  Location: [facility];  Service: Endoscopy;  Laterality: N/A;  . CARDIAC CATHETERIZATION  01/11/2006   DEMONSTRATES AKINESIA OF THE DISTAL ANTERIOR WALL, DISTAL INFERIOR WALL AND AKINESIA OF THE APEX. THE BASAL SEGMENTS CONTRACT WELL AND OVERALL EF 35%  . CATARACT EXTRACTION W/ INTRAOCULAR LENS  IMPLANT, BILATERAL  08/2014-09/2014  . CHOLECYSTECTOMY  2/11  . CORONARY ANGIOPLASTY  1994   TO THE LAD  . ESOPHAGEAL MANOMETRY N/A 03/16/2015   Procedure: ESOPHAGEAL MANOMETRY (EM);  Surgeon: Jerene Bears, MD;  Location: [facility];  Service: Gastroenterology;  Laterality: N/A;  . ESOPHAGOGASTRODUODENOSCOPY N/A 04/08/2015   Procedure: ESOPHAGOGASTRODUODENOSCOPY (EGD);  Surgeon: Milus Banister, MD;  Location: [facility];  Service: Endoscopy;  Laterality: N/A;  . ESOPHAGOGASTRODUODENOSCOPY (EGD) WITH PROPOFOL N/A 09/09/2015   Procedure: ESOPHAGOGASTRODUODENOSCOPY (EGD) WITH PROPOFOL;  Surgeon: Mauri Pole, MD;  Location: [facility];  Service: Endoscopy;  Laterality: N/A;  pt. is to have gastrografin xray post recovery-do not discharge until results are back  . FOOT FRACTURE SURGERY Right 1980's  . INSERT / REPLACE / REMOVE PACEMAKER    . SKIN CANCER EXCISION  "several"   "forearms, head"    Social History: Social History  Substance Use Topics  . Smoking status: Former Smoker    Packs/day: 1.00    Years: 35.00    Types: Cigarettes    Quit date: 08/01/1992  . Smokeless tobacco: Never Used  . Alcohol use No   Additional social history: Lives alone with minimal assistance from family members.   Please also refer to relevant sections of EMR.  Family History: Family History  Problem Relation Age of Onset  . Stroke Father        Mother history unknown - never met her  . Colon cancer Neg Hx     Allergies and Medications: Allergies  Allergen Reactions  .  Codeine Other (See Comments)    Gets very angry, disoriented  . Dilaudid [Hydromorphone Hcl] Other (See Comments)    VERY AGITATED, HOSTILE  . Flomax [Tamsulosin Hcl] Shortness Of Breath  . Morphine And Related Other (See Comments)    VERY AGITATED, HOSTILE  . Sulfa Antibiotics Shortness Of Breath  . Beta Adrenergic Blockers Other (See Comments)    Disorientation  .  Carvedilol Other (See Comments)    DISORIENTATION   No current facility-administered medications on file prior to encounter.    Current Outpatient Prescriptions on File Prior to Encounter  Medication Sig Dispense Refill  . albuterol (PROVENTIL HFA;VENTOLIN HFA) 108 (90 Base) MCG/ACT inhaler Inhale 2 puffs into the lungs every 6 (six) hours as needed for wheezing or shortness of breath. 1 Inhaler 2  . aspirin 81 MG EC tablet Take 81 mg by mouth daily.      . Cholecalciferol (VITAMIN D) 2000 units tablet Take 2,000 Units by mouth daily.     Marland Kitchen donepezil (ARICEPT) 10 MG tablet Take 1 tablet (10 mg total) by mouth every morning. 90 tablet 3  . fenofibrate (TRICOR) 145 MG tablet TAKE 1 TABLET BY MOUTH EVERY DAY (Patient taking differently: Take 145 mg by mouth once a day) 90 tablet 3  . fluoruracil (CARAC) 0.5 % cream Apply 1 application topically 2 (two) times daily. TO AFFECTED AREA    . furosemide (LASIX) 20 MG tablet Take 2 tablets (40 mg total) by mouth daily. 180 tablet 3  . insulin aspart (NOVOLOG FLEXPEN) 100 UNIT/ML FlexPen Inject 5-6 Units into the skin See admin instructions. Inject 6 units SQ with breakfast, 5 units SQ with lunch and 5 units SQ with dinner (Patient taking differently: Inject 5-7 Units into the skin See admin instructions. Per sliding scale and depending on meal consumption: 6 units into the skin with breakfast then 5 units with lunch then 5 units with dinner (evening meal))    . Insulin Glargine (BASAGLAR KWIKPEN) 100 UNIT/ML SOPN INJECT 30 UNITS UNDER THE SKIN EVERY MORNING (Patient taking differently:  Inject 22-30 units into the skin every morning) 15 mL 0  . loratadine (CLARITIN) 10 MG tablet Take 1 tablet (10 mg total) by mouth daily. 30 tablet 1  . memantine (NAMENDA) 10 MG tablet Take one tablet twice daily. Please call [phone] for an appointment. (Patient taking differently: Take 10 mg by mouth 2 (two) times daily. ) 180 tablet 0  . Multiple Vitamins-Minerals (PRESERVISION AREDS 2) CAPS Take 1 capsule by mouth 2 (two) times daily.    . nitroGLYCERIN (NITRODUR - DOSED IN MG/24 HR) 0.2 mg/hr patch Place 1 patch (0.2 mg total) onto the skin daily. Alternates patch placement with odd/even days. Even days on left side, odd days on right side. 30 patch 6  . pantoprazole (PROTONIX) 20 MG tablet TAKE 1 TABLET BY MOUTH DAILY (Patient taking differently: Take 20 mg by mouth once a day) 90 tablet 0  . pravastatin (PRAVACHOL) 40 MG tablet Take 1 tablet (40 mg total) by mouth daily. (Patient taking differently: Take 40 mg by mouth at bedtime. ) 90 tablet 1  . SPIRIVA HANDIHALER 18 MCG inhalation capsule INHALE CONTENTS OF 1 CAPSULE VIA HANDIHALER ONCE DAILY 30 capsule 4  . SYMBICORT 160-4.5 MCG/ACT inhaler INHALE 2 PUFFS BY MOUTH TWICE DAILY 10.2 g 5  . azithromycin (ZITHROMAX) 500 MG tablet Take 1 tablet (500 mg total) by mouth daily. (Patient not taking: Reported on 02/15/2017) 2 tablet 0  . predniSONE (DELTASONE) 10 MG tablet 37m (4 tabs) on 02/04/2017.Then 330m(3 tabs) on 7/8,then 206m2 tabs) for 2 days (on 7/9 and 7/10) then 63m15m tab) for 2 days (7/11+7/12) (Patient not taking: Reported on 02/15/2017) 13 tablet 0    Objective: BP (!) 110/49   Pulse 63   Temp (!) 102.6 F (39.2 C) (Oral)   Resp (!) 21   SpO2 93%  Exam: General: alert, oriented x3. Resting comfortably. Drowsy, easily arousable Eyes: eomi, perrl ENTM: external ear with no signs of trauma, external nose no signs of trauma, good dentition Neck: neck supple Cardiovascular: regular rate, regular rhythm, no murmurs, gallops,  rubs appreciated Respiratory: scattered bilateral wheezes Gastrointestinal: soft, non-tender, non-distended, obese, circular raised skin pigment noted on mid-abdomen MSK: able to move all four extremities, 5/5 strength extension/flexion BUE/BLE, 5/5 strength hands and toes bilaterally. Derm: warm, dry Neuro: A&Ox3.  Slightly delayed responses, drowsy, no neuro deficits appreciated in extremities, CN2-12 intact Psych: appropriate, pleasant   Labs and Imaging: CBC BMET   Recent Labs Lab 02/15/17 1738  WBC 25.2*  HGB 13.6  HCT 42.8  PLT 219    Recent Labs Lab 02/15/17 1738  NA 138  K 4.0  CL 101  CO2 26  BUN 31*  CREATININE 2.17*  GLUCOSE 190*  CALCIUM 8.8Guadalupe Dawn, MD 02/15/2017, 9:44 PM PGY-1, [place] Intern pager: [phone], text pages welcome  Upper Level Addendum:  I have seen and evaluated this patient along with Dr. Kris Mouton and reviewed the above note, making necessary revisions in green.   Phill Myron, D.O. 02/16/2017, 12:06 AM PGY-3, [place]

## 2017-02-15 NOTE — ED Triage Notes (Signed)
Pt arrives by [facility] as a code stroke coming from home, pt was last seen normal at 1245 and then was found to have a unsteady gait with abnormal speech and confused. Pt taken to CT on arrival and met by MD Leonel Ramsay.

## 2017-02-15 NOTE — ED Notes (Signed)
[facility]

## 2017-02-16 ENCOUNTER — Telehealth: Payer: Self-pay | Admitting: Neurology

## 2017-02-16 ENCOUNTER — Encounter (HOSPITAL_COMMUNITY): Payer: Self-pay | Admitting: *Deleted

## 2017-02-16 ENCOUNTER — Other Ambulatory Visit: Payer: Self-pay | Admitting: Pulmonary Disease

## 2017-02-16 DIAGNOSIS — Z888 Allergy status to other drugs, medicaments and biological substances status: Secondary | ICD-10-CM | POA: Diagnosis not present

## 2017-02-16 DIAGNOSIS — I255 Ischemic cardiomyopathy: Secondary | ICD-10-CM | POA: Diagnosis present

## 2017-02-16 DIAGNOSIS — I5042 Chronic combined systolic (congestive) and diastolic (congestive) heart failure: Secondary | ICD-10-CM | POA: Diagnosis present

## 2017-02-16 DIAGNOSIS — E785 Hyperlipidemia, unspecified: Secondary | ICD-10-CM | POA: Diagnosis present

## 2017-02-16 DIAGNOSIS — Z85828 Personal history of other malignant neoplasm of skin: Secondary | ICD-10-CM | POA: Diagnosis not present

## 2017-02-16 DIAGNOSIS — N400 Enlarged prostate without lower urinary tract symptoms: Secondary | ICD-10-CM | POA: Diagnosis present

## 2017-02-16 DIAGNOSIS — N183 Chronic kidney disease, stage 3 (moderate): Secondary | ICD-10-CM | POA: Diagnosis present

## 2017-02-16 DIAGNOSIS — J438 Other emphysema: Secondary | ICD-10-CM | POA: Diagnosis present

## 2017-02-16 DIAGNOSIS — K219 Gastro-esophageal reflux disease without esophagitis: Secondary | ICD-10-CM | POA: Diagnosis present

## 2017-02-16 DIAGNOSIS — N390 Urinary tract infection, site not specified: Secondary | ICD-10-CM

## 2017-02-16 DIAGNOSIS — E1122 Type 2 diabetes mellitus with diabetic chronic kidney disease: Secondary | ICD-10-CM | POA: Diagnosis present

## 2017-02-16 DIAGNOSIS — Z882 Allergy status to sulfonamides status: Secondary | ICD-10-CM | POA: Diagnosis not present

## 2017-02-16 DIAGNOSIS — I252 Old myocardial infarction: Secondary | ICD-10-CM | POA: Diagnosis not present

## 2017-02-16 DIAGNOSIS — Z87891 Personal history of nicotine dependence: Secondary | ICD-10-CM | POA: Diagnosis not present

## 2017-02-16 DIAGNOSIS — Z885 Allergy status to narcotic agent status: Secondary | ICD-10-CM | POA: Diagnosis not present

## 2017-02-16 DIAGNOSIS — Z95810 Presence of automatic (implantable) cardiac defibrillator: Secondary | ICD-10-CM | POA: Diagnosis not present

## 2017-02-16 DIAGNOSIS — R4182 Altered mental status, unspecified: Secondary | ICD-10-CM | POA: Diagnosis not present

## 2017-02-16 DIAGNOSIS — Z87440 Personal history of urinary (tract) infections: Secondary | ICD-10-CM | POA: Diagnosis not present

## 2017-02-16 DIAGNOSIS — H9190 Unspecified hearing loss, unspecified ear: Secondary | ICD-10-CM | POA: Diagnosis present

## 2017-02-16 DIAGNOSIS — Z9861 Coronary angioplasty status: Secondary | ICD-10-CM | POA: Diagnosis not present

## 2017-02-16 DIAGNOSIS — I2510 Atherosclerotic heart disease of native coronary artery without angina pectoris: Secondary | ICD-10-CM | POA: Diagnosis present

## 2017-02-16 DIAGNOSIS — E861 Hypovolemia: Secondary | ICD-10-CM | POA: Diagnosis present

## 2017-02-16 DIAGNOSIS — K220 Achalasia of cardia: Secondary | ICD-10-CM | POA: Diagnosis present

## 2017-02-16 DIAGNOSIS — Z823 Family history of stroke: Secondary | ICD-10-CM | POA: Diagnosis not present

## 2017-02-16 DIAGNOSIS — R413 Other amnesia: Secondary | ICD-10-CM | POA: Diagnosis present

## 2017-02-16 LAB — BASIC METABOLIC PANEL
ANION GAP: 8 (ref 5–15)
BUN: 29 mg/dL — ABNORMAL HIGH (ref 6–20)
CALCIUM: 8.1 mg/dL — AB (ref 8.9–10.3)
CO2: 25 mmol/L (ref 22–32)
Chloride: 108 mmol/L (ref 101–111)
Creatinine, Ser: 1.75 mg/dL — ABNORMAL HIGH (ref 0.61–1.24)
GFR, EST AFRICAN AMERICAN: 40 mL/min — AB (ref >60)
GFR, EST NON AFRICAN AMERICAN: 35 mL/min — AB (ref >60)
GLUCOSE: 139 mg/dL — AB (ref 65–99)
POTASSIUM: 3.8 mmol/L (ref 3.5–5.1)
Sodium: 141 mmol/L (ref 135–145)

## 2017-02-16 LAB — GLUCOSE, CAPILLARY
GLUCOSE-CAPILLARY: 127 mg/dL — AB (ref 65–99)
GLUCOSE-CAPILLARY: 134 mg/dL — AB (ref 65–99)
GLUCOSE-CAPILLARY: 153 mg/dL — AB (ref 65–99)
Glucose-Capillary: 157 mg/dL — ABNORMAL HIGH (ref 65–99)

## 2017-02-16 LAB — CBC
HCT: 39.6 % (ref 39.0–52.0)
Hemoglobin: 12.2 g/dL — ABNORMAL LOW (ref 13.0–17.0)
MCH: 27.4 pg (ref 26.0–34.0)
MCHC: 30.8 g/dL (ref 30.0–36.0)
MCV: 88.8 fL (ref 78.0–100.0)
PLATELETS: 197 10*3/uL (ref 150–400)
RBC: 4.46 MIL/uL (ref 4.22–5.81)
RDW: 15.7 % — AB (ref 11.5–15.5)
WBC: 20.8 10*3/uL — AB (ref 4.0–10.5)

## 2017-02-16 MED ORDER — INSULIN GLARGINE 100 UNIT/ML ~~LOC~~ SOLN
10.0000 [IU] | Freq: Every day | SUBCUTANEOUS | Status: DC
  Administered 2017-02-17: 10 [IU] via SUBCUTANEOUS
  Filled 2017-02-16: qty 0.1

## 2017-02-16 MED ORDER — DEXTROSE 5 % IV SOLN
1.0000 g | INTRAVENOUS | Status: DC
  Administered 2017-02-16: 1 g via INTRAVENOUS
  Filled 2017-02-16: qty 10

## 2017-02-16 MED ORDER — FUROSEMIDE 40 MG PO TABS
40.0000 mg | ORAL_TABLET | Freq: Every day | ORAL | Status: DC
  Administered 2017-02-17: 40 mg via ORAL
  Filled 2017-02-16: qty 1

## 2017-02-16 MED ORDER — MEMANTINE HCL 5 MG PO TABS
10.0000 mg | ORAL_TABLET | Freq: Two times a day (BID) | ORAL | Status: DC
  Administered 2017-02-16 – 2017-02-17 (×4): 10 mg via ORAL
  Filled 2017-02-16 (×4): qty 2

## 2017-02-16 MED ORDER — SODIUM CHLORIDE 0.9 % IV SOLN
INTRAVENOUS | Status: AC
  Administered 2017-02-16: 03:00:00 via INTRAVENOUS

## 2017-02-16 MED ORDER — DONEPEZIL HCL 10 MG PO TABS
10.0000 mg | ORAL_TABLET | Freq: Every morning | ORAL | Status: DC
  Administered 2017-02-16 – 2017-02-17 (×2): 10 mg via ORAL
  Filled 2017-02-16 (×2): qty 1

## 2017-02-16 MED ORDER — PRAVASTATIN SODIUM 40 MG PO TABS
40.0000 mg | ORAL_TABLET | Freq: Every day | ORAL | Status: DC
  Administered 2017-02-16: 40 mg via ORAL
  Filled 2017-02-16 (×2): qty 1

## 2017-02-16 MED ORDER — ENOXAPARIN SODIUM 30 MG/0.3ML ~~LOC~~ SOLN
30.0000 mg | SUBCUTANEOUS | Status: DC
  Administered 2017-02-16: 30 mg via SUBCUTANEOUS
  Filled 2017-02-16 (×2): qty 0.3

## 2017-02-16 MED ORDER — MEMANTINE HCL 10 MG PO TABS: 10.0000 mg | ORAL_TABLET | Freq: Two times a day (BID) | ORAL | 3 refills | Status: DC

## 2017-02-16 MED ORDER — SODIUM CHLORIDE 0.9 % IV SOLN: INTRAVENOUS | Status: DC

## 2017-02-16 MED ORDER — MOMETASONE FURO-FORMOTEROL FUM 200-5 MCG/ACT IN AERO
2.0000 | INHALATION_SPRAY | Freq: Two times a day (BID) | RESPIRATORY_TRACT | Status: DC
  Administered 2017-02-16 – 2017-02-17 (×3): 2 via RESPIRATORY_TRACT
  Filled 2017-02-16: qty 8.8

## 2017-02-16 MED ORDER — ASPIRIN EC 81 MG PO TBEC
81.0000 mg | DELAYED_RELEASE_TABLET | Freq: Every day | ORAL | Status: DC
  Administered 2017-02-16 – 2017-02-17 (×2): 81 mg via ORAL
  Filled 2017-02-16 (×2): qty 1

## 2017-02-16 MED ORDER — TIOTROPIUM BROMIDE MONOHYDRATE 18 MCG IN CAPS
18.0000 ug | ORAL_CAPSULE | Freq: Every day | RESPIRATORY_TRACT | Status: DC
  Administered 2017-02-16 – 2017-02-17 (×2): 18 ug via RESPIRATORY_TRACT
  Filled 2017-02-16: qty 5

## 2017-02-16 MED ORDER — ALBUTEROL SULFATE (2.5 MG/3ML) 0.083% IN NEBU: 3.0000 mL | INHALATION_SOLUTION | Freq: Four times a day (QID) | RESPIRATORY_TRACT | Status: DC | PRN

## 2017-02-16 MED ORDER — LORATADINE 10 MG PO TABS
10.0000 mg | ORAL_TABLET | Freq: Every day | ORAL | Status: DC
  Administered 2017-02-16 – 2017-02-17 (×2): 10 mg via ORAL
  Filled 2017-02-16 (×2): qty 1

## 2017-02-16 MED ORDER — INSULIN ASPART 100 UNIT/ML ~~LOC~~ SOLN
0.0000 [IU] | Freq: Three times a day (TID) | SUBCUTANEOUS | Status: DC
  Administered 2017-02-16 (×2): 1 [IU] via SUBCUTANEOUS
  Administered 2017-02-16 – 2017-02-17 (×2): 2 [IU] via SUBCUTANEOUS
  Administered 2017-02-17: 1 [IU] via SUBCUTANEOUS

## 2017-02-16 MED ORDER — PANTOPRAZOLE SODIUM 20 MG PO TBEC
20.0000 mg | DELAYED_RELEASE_TABLET | Freq: Every day | ORAL | Status: DC
  Administered 2017-02-16 – 2017-02-17 (×2): 20 mg via ORAL
  Filled 2017-02-16 (×2): qty 1

## 2017-02-16 NOTE — ED Notes (Signed)
Attempted report no answer

## 2017-02-16 NOTE — Telephone Encounter (Signed)
Refills sent to pharmacy to last until his appt.

## 2017-02-16 NOTE — Telephone Encounter (Signed)
Patients son called to schedule fu visit with Dr. Krista Blue for 05/01/17 and would like to know if patient can have memantine [facility]) tablet 10 mg refilled.  [place]

## 2017-02-16 NOTE — Progress Notes (Signed)
Family Medicine Teaching Service Daily Progress Note Intern Pager: [phone]  Patient name: Antonio Ray Medical record number: [id number] Date of birth: Jul 15, 1934 Age: 83 y.o. Gender: male  Primary Care Provider: [name], MD Consultants: none Code Status: full  Pt Overview and Major Events to Date:  Antonio Ray is a 83 y.o. male w/ PMH of achalasia, COPD, HFrEF, short term memory loss, HLD, Type II DM, BPH presenting with one day history of altered mental status. Was last heard and seen normal around noon on 7/19. Found confused with no pants on, and unable to ambulate at 1630 on 7/19. Brought to emergency department for code stroke. CT scan with no intracranial abnormalities. Febrile to 102.6 in ed, ua with bacteria/Leukocytes along with burning with urination. Presumed to have UTI, started on rocephin in ed. Altered mental status much improved after receiving abx.  Assessment and Plan: Altered Mental Status Patient presents with altered mental status beginning in pm of 7/18. Has been having burning urination overnight of 7/17. CT scan (-) for any intracranial bleed, mass, or abnormality. WBC of 25, ua with leuks/bacteria. Febrile to 102.6 in ED. Received 1 dose of rocephin in ed. Now Aox3, but some slight drowsiness and slowness with responses. It seems very likely that the altered mental status is 2/2 to a uti, especially given similar symptoms with uti in the past. Other possible etiologies are stroke, seizures, electrolyte abnormalities, medications. These are all very unlikely given time course, prior tests/imaging, and history. CXR also negative for infiltrate or consolidation that would be concerning for PNA. Patient has not had any new cough or SOB. Will plan to continue to treat uti with iv antibiotics until culture sensitivities result. Neurology evaluated patient in ED and felt that his presentation was most consistent with infectious etiology given fever. They were not  concerned for CNS infection. Afebrile overnight from 7/18-7/19. - vital per floor routine - follow up urine culture/blood culture - continue rocephin for now, likely switch to po today when culture - monitor temperature curve - neuro checks q 4 hours  Leukocytosis: Has been off Prednisone taper for 1.5 weeks. Had WBC of 18 on last admission discharge on 7/8 due to prednisone for COPD exacerbation Patient with wbc and bacteria on ua. Chest xray with no areas consistent with pneumonia. Patient with burning on urination. Blood cultures taken in emergency department. Received 1 dose of rocephin in er. Given that patient has evidence of infection on UA, has had improvement on antibiotics in er it is likely that leukocytosis is due to infection. WBC improved from 25 to 21, on one day of antibiotics. Most likely source is uti. Will follow blood cultures during hospital admission. - Monitor temperature - follow mental status - Will continue rocephin until culture/sensitivities return, likely switch to po 7/19 - follow blood cultures/ urine cultures  Chronic Kidney Disease  Creatinine of 2.17 on admission, creatinine of 1.86 on admission on 7/6. Will watch fluid status carefully. Currently tolerating Po. Creatinine down to 1.75. Given low bp and high HR on admission likely hypovolemic on presentation. Continue gentle iv hydration course, continue holding lasix - NS 50 mL/hr for 12 hours - hold home lasix  - tolerating po   Type II diabetes Mellitus Patient with glucose of 190 on admission, and hgb A1c of 7.9 at last check in April 2018. Blood glucose 139 in am of 7/19. Takes insulin novolog 6 u breakfast, 5u lunch/dinner. Takes lantus 30U in morning. Recent history of  steroid use and likely infection. Will start diabetic diet when tolerating PO. Will put on SSI while in hospital and give lantus 10U in am. - diabetic diet - f/u hgb a1c - blood glucose with daily bmp - fingerstick with meals and at  night  COPD Recently hospitalized with copd exacerbation on 7/6. Was discharged on steroid taper and 3 days of azithro. Since discharge patient has been doing very well from a respiratory standpoint. Will plan to continue home spiriva, symbicort, albuterol - continue home spiriva, symbicort, albuterol  HErEF: Followed as an outpatient by dr. Donneta Romberg Last echo on 04/2016 showed EF of 20-25%. Currently takes 20mg  lasix at home. Dry weight 181-183. Weight this AM 179 lbs.  - Will hold lasix - Monitor Fluid status - Will do gentle iv resuscitation given   Hyperlipidemia Will continue home statin when patient tolerating PO - continue pravastatin - holding home fenofibrate due to CrCl <30    Short Term Memory Loss Currently taking Namenda and Aricept. Lives alone and functions independently requiring only minimal help from children for ADLs. - Will continue namenda, aricept - Will monitor mental status - Likely ok for dc home when AMS resolves pending clinical improvement - OT/PT to assess ambulatory status  Achalasia? Is followed as an outpatient by Dr. Silverio Decamp. Has a history of balloon dilatation. Regurgitates both solids and liquids. Monitor for aspiration risk. Speech to evaluate for swallow study in setting of AMS. - MBSS today  BPH Has diagnosis of bph but all meds make him dizzy per son. Will continue to monitor for s/s of retention.  FEN/GI: heart healthy/diabetic, protonix Prophylaxis: lovenox  Disposition: home  Subjective:  Doing much better today, AOx4. Much more alert with responses. Going down for MBSS this am. No complaints.  Objective: Temp:  [97.9 F (36.6 C)-102.6 F (39.2 C)] 98.1 F (36.7 C) (07/19 0630) Pulse Rate:  [59-97] 69 (07/19 0630) Resp:  [16-35] 18 (07/19 0630) BP: (98-131)/(45-61) 104/60 (07/19 0630) SpO2:  [92 %-100 %] 97 % (07/19 0630) Weight:  [185 lb (83.9 kg)] 185 lb (83.9 kg) (07/19 0230) Physical Exam: General: alert, oriented  x3. Resting comfortably. alert ENTM: external ear with no signs of trauma, external nose no signs of trauma, good dentition Cardiovascular: regular rate, regular rhythm, no murmurs, gallops, rubs appreciated Respiratory: scattered bilateral wheezes Gastrointestinal: soft, non-tender, non-distended, obese, circular raised skin pigment noted on mid-abdomen MSK: able to move all four extremities, 5/5 strength extension/flexion BUE/BLE, 5/5 strength hands and toes bilaterally. Derm: warm, dry Neuro: A&Ox3. Alertness improved, drowsy, no neuro deficits appreciated in extremities, CN2-12 intact Psych: appropriate, pleasant  Laboratory:  Recent Labs Lab 02/15/17 1729 02/15/17 1738 02/16/17 0509  WBC  --  25.2* 20.8*  HGB 15.0 13.6 12.2*  HCT 44.0 42.8 39.6  PLT  --  219 197    Recent Labs Lab 02/15/17 1729 02/15/17 1738 02/16/17 0509  NA 139 138 141  K 4.1 4.0 3.8  CL 100* 101 108  CO2  --  26 25  BUN 35* 31* 29*  CREATININE 2.10* 2.17* 1.75*  CALCIUM  --  8.8* 8.1*  PROT  --  6.8  --   BILITOT  --  0.8  --   ALKPHOS  --  52  --   ALT  --  23  --   AST  --  27  --   GLUCOSE 191* 190* 139*    Imaging/Diagnostic Tests: CLINICAL DATA:  Code stroke. 83 y/o M; aphasia and  altered mental status.  EXAM: CT HEAD WITHOUT CONTRAST  TECHNIQUE: Contiguous axial images were obtained from the base of the skull through the vertex without intravenous contrast.  COMPARISON:  None.  FINDINGS: Brain: No evidence of acute infarction, hemorrhage, hydrocephalus, extra-axial collection or mass lesion/mass effect. Mild chronic microvascular ischemic changes and parenchymal volume loss of the brain.  Vascular: Calcific atherosclerosis of carotid siphons. No hyperdense vessel identified.  Skull: Normal. Negative for fracture or focal lesion.  Sinuses/Orbits: No acute finding.  Other: None.  ASPECTS [facility] Stroke Program Early CT Score)  - Ganglionic level  infarction (caudate, lentiform nuclei, internal capsule, insula, M1-M3 cortex): 7  - Supraganglionic infarction (M4-M6 cortex): 3  Total score (0-10 with 10 being normal): 10  IMPRESSION: 1. No acute intracranial abnormality identified. 2. ASPECTS is 10 3. Mild for age chronic microvascular ischemic changes and mild parenchymal volume loss of the brain. These results were called by telephone at the time of interpretation on 02/15/2017 at 5:42 pm to Dr. Leonel Ramsay, who verbally acknowledged these results.  CLINICAL DATA:  Unsteady gait with abnormal speech and confusion.  EXAM: PORTABLE CHEST 1 VIEW  COMPARISON:  02/02/2017  FINDINGS: Dual lead pacer with lead tips projecting over the expected locations of the right atrium and ventricle.  Atherosclerotic calcification of the aortic arch.  Mild biapical pleuroparenchymal scarring. The lungs appear otherwise clear.  Heart size within normal limits.  IMPRESSION: 1.  No active cardiopulmonary disease is radiographically apparent. 2.  Aortic Atherosclerosis (ICD10-I70.0).  Guadalupe Dawn, MD 02/16/2017, 8:09 AM PGY-1, [facility] Intern pager: [phone], text pages welcome

## 2017-02-16 NOTE — Progress Notes (Signed)
Pharmacy Antibiotic Note  Antonio Ray is a 83 y.o. male admitted on 02/15/2017 with UTI.  Pharmacy has been consulted for Rocephin dosing.  Plan: Rocephin 1g IV Q24H.  Pharmacy will sign off.  Temp (24hrs), Avg:100.8 F (38.2 C), Min:98 F (36.7 C), Max:102.6 F (39.2 C)   Recent Labs Lab 02/15/17 1729 02/15/17 1738 02/15/17 2048  WBC  --  25.2*  --   CREATININE 2.10* 2.17*  --   LATICACIDVEN  --   --  0.77    Estimated Creatinine Clearance: 28.1 mL/min (A) (by C-G formula based on SCr of 2.17 mg/dL (H)).    Allergies  Allergen Reactions  . Codeine Other (See Comments)    Gets very angry, disoriented  . Dilaudid [Hydromorphone Hcl] Other (See Comments)    VERY AGITATED, HOSTILE  . Flomax [Tamsulosin Hcl] Shortness Of Breath  . Morphine And Related Other (See Comments)    VERY AGITATED, HOSTILE  . Sulfa Antibiotics Shortness Of Breath  . Beta Adrenergic Blockers Other (See Comments)    Disorientation  . Carvedilol Other (See Comments)    DISORIENTATION     Thank you for allowing pharmacy to be a part of this patient's care.  Wynona Neat, PharmD, BCPS  02/16/2017 1:55 AM

## 2017-02-16 NOTE — Evaluation (Signed)
Clinical/Bedside Swallow Evaluation Patient Details  Name: Antonio Ray MRN: [id number] Date of Birth: 07-Jun-1934  Today's Date: 02/16/2017 Time: SLP Start Time (ACUTE ONLY): 77 SLP Stop Time (ACUTE ONLY): 0940 SLP Time Calculation (min) (ACUTE ONLY): 20 min  Past Medical History:  Past Medical History:  Diagnosis Date  . Achalasia   . AICD (automatic cardioverter/defibrillator) present 03/30/2011   Analyze ST study patient  . Anginal pain ([place])   . BPH (benign prostatic hyperplasia)   . CHF (congestive heart failure) ([place])   . Chronic systolic dysfunction of left ventricle    EF 30-35%, CLASS II - III SYMPTOMS; intolerant to Coreg  . CKD (chronic kidney disease) stage 3, GFR 30-59 ml/min 06/08/2012  . COPD (chronic obstructive pulmonary disease) ([place])   . Coronary artery disease    History of remote anterior MI with PCI to LAD in 2006; most recent cath 2007, no intervention required  . DOE (dyspnea on exertion)    with heavy exertion  . Dysrhythmia   . Esophageal dysmotility   . GERD (gastroesophageal reflux disease)   . GERD (gastroesophageal reflux disease)   . Head injury, closed, with concussion 2000ish  . Heart murmur    hx  . HOH (hard of hearing)   . Hyperlipidemia   . Memory loss    improved  . Myocardial infarction ([place]) 1994   ANTERIOR  . Pneumonia "several times"   aspiration pna with at least 3 admits for this 2016.   Marland Kitchen PVC's (premature ventricular contractions)   . Renal failure   . Skin cancer "several"   "forearms; head"  . Type II diabetes mellitus ([place])    Past Surgical History:  Past Surgical History:  Procedure Laterality Date  . BALLOON DILATION N/A 09/09/2015   Procedure: BALLOON DILATION;  Surgeon: Mauri Pole, MD;  Location: [facility];  Service: Endoscopy;  Laterality: N/A;  rigiflex achalasia balloon dilators  . BOTOX INJECTION N/A 04/08/2015   Procedure: BOTOX INJECTION;  Surgeon: Milus Banister, MD;  Location: [facility];   Service: Endoscopy;  Laterality: N/A;  . CARDIAC CATHETERIZATION  01/11/2006   DEMONSTRATES AKINESIA OF THE DISTAL ANTERIOR WALL, DISTAL INFERIOR WALL AND AKINESIA OF THE APEX. THE BASAL SEGMENTS CONTRACT WELL AND OVERALL EF 35%  . CATARACT EXTRACTION W/ INTRAOCULAR LENS  IMPLANT, BILATERAL  08/2014-09/2014  . CHOLECYSTECTOMY  2/11  . CORONARY ANGIOPLASTY  1994   TO THE LAD  . ESOPHAGEAL MANOMETRY N/A 03/16/2015   Procedure: ESOPHAGEAL MANOMETRY (EM);  Surgeon: Jerene Bears, MD;  Location: [facility];  Service: Gastroenterology;  Laterality: N/A;  . ESOPHAGOGASTRODUODENOSCOPY N/A 04/08/2015   Procedure: ESOPHAGOGASTRODUODENOSCOPY (EGD);  Surgeon: Milus Banister, MD;  Location: [facility];  Service: Endoscopy;  Laterality: N/A;  . ESOPHAGOGASTRODUODENOSCOPY (EGD) WITH PROPOFOL N/A 09/09/2015   Procedure: ESOPHAGOGASTRODUODENOSCOPY (EGD) WITH PROPOFOL;  Surgeon: Mauri Pole, MD;  Location: [facility];  Service: Endoscopy;  Laterality: N/A;  pt. is to have gastrografin xray post recovery-do not discharge until results are back  . EYE SURGERY    . FOOT FRACTURE SURGERY Right 1980's  . INSERT / REPLACE / REMOVE PACEMAKER    . SKIN CANCER EXCISION  "several"   "forearms, head"   HPI:  83 year old male admitted 02/15/17 with AMS, UTI.  PMH significant for achalasia, COPD, esophageal dysmotility, GERD, TBI, MI, aspiration PNA (adm x3 in 2016). Multiple BSE/MBS. Head CT = negative, CXR = COPD, negative for acute abnormalities   Assessment / Plan /  Recommendation Clinical Impression  Pt presents with adequate oral motor strength and function. No overt s/s aspiration observed or reported on any consistency. Pt has a long standing history of esophageal dysphagia with dx achalasia, dysmotility, and GERD. Most recent MBS (May 2018) revealed no penetration or aspiration, and current CXR indicates no acute abnormality. Pt was given written information on dietary and behavioral strategies for managing  esophageal dysmotility including beginning meals with warm liquid and use of carbonated beverages to facilitate esophageal clearing.   Recommend continuing with current diet. Pt is aware of the need to adhere to esophageal dysmotility strategies, but is under the impression that esophageal dilation is a one-time occurance. Educated pt that this may need to be done more than once. Recommend continued monitoring of esophageal issues to determine if additional intervention is warranted. No further ST intervention recommended at this time. Please reconsult if needs arise.     SLP Visit Diagnosis: Dysphagia, pharyngoesophageal phase (R13.14)    Aspiration Risk  Moderate aspiration risk;Mild aspiration risk (given significance of esophageal dysphagia)    Diet Recommendation Regular;Thin liquid   Liquid Administration via: Straw;Cup Medication Administration: Whole meds with liquid Supervision: Patient able to self feed Compensations: Minimize environmental distractions;Slow rate;Small sips/bites;Follow solids with liquid Postural Changes: Seated upright at 90 degrees;Remain upright for at least 30 minutes after po intake    Other  Recommendations Recommended Consults: Consider esophageal assessment (if needs arise) Oral Care Recommendations: Oral care BID   Follow up Recommendations None      Frequency and Duration   n/a         Prognosis Prognosis for Safe Diet Advancement: Good      Swallow Study   General Date of Onset: 02/15/17 HPI: 83 year old male admitted 02/15/17 with AMS, UTI.  PMH significant for achalasia, COPD, esophageal dysmotility, GERD, TBI, MI, aspiration PNA (adm x3 in 2016). Multiple BSE/MBS. Head CT = negative, CXR = COPD, negative for acute abnormalities Type of Study: Bedside Swallow Evaluation Previous Swallow Assessment: MBS 11/12/16 - no penetration or aspiration, primary esophageal dysphagia. Reg/thin recommended. Diet Prior to this Study: Regular;Thin  liquids Temperature Spikes Noted: Yes (102.6 on admit) Respiratory Status: Room air History of Recent Intubation: No Behavior/Cognition: Alert;Cooperative;Pleasant mood Oral Cavity Assessment: Within Functional Limits Oral Care Completed by SLP: No Oral Cavity - Dentition: Dentures, top;Dentures, bottom Vision: Functional for self-feeding Self-Feeding Abilities: Able to feed self Patient Positioning: Upright in bed Baseline Vocal Quality: Normal Volitional Cough: Strong Volitional Swallow: Able to elicit    Oral/Motor/Sensory Function Overall Oral Motor/Sensory Function: Within functional limits   Ice Chips Ice chips: Not tested   Thin Liquid Thin Liquid: Within functional limits Presentation: Straw    Nectar Thick Nectar Thick Liquid: Not tested   Honey Thick Honey Thick Liquid: Not tested   Puree Puree: Within functional limits Presentation: Self Fed;Spoon   Solid   GO   Solid: Within functional limits Presentation: Self Fed    Functional Assessment Tool Used: asha noms, clinical judgment, BSE Functional Limitations: Swallowing Swallow Current Status (W9675): At least 1 percent but less than 20 percent impaired, limited or restricted Swallow Goal Status [phone]): At least 1 percent but less than 20 percent impaired, limited or restricted Swallow Discharge Status [phone]): At least 1 percent but less than 20 percent impaired, limited or restricted  Yarlin Breisch B. [place], [place], [place]  Shonna Chock 02/16/2017,9:53 AM

## 2017-02-16 NOTE — Addendum Note (Signed)
Addended by: Noberto Retort C on: 02/16/2017 04:07 PM   Modules accepted: Orders

## 2017-02-16 NOTE — Progress Notes (Signed)
Pt admitted from ED with code stroke, with AMS and possible U TI . Pt now alert and oriented  X 4 , neuro intact, moves all extremities  ,No numbness tingling nor  weakness. Cardiac monitor placed  Box 5 C 07 CC MT called and  Verified. Pt denied pain . No acute distress now. RN will continue to monitor.

## 2017-02-17 ENCOUNTER — Encounter (HOSPITAL_COMMUNITY): Payer: Self-pay | Admitting: *Deleted

## 2017-02-17 DIAGNOSIS — R4182 Altered mental status, unspecified: Secondary | ICD-10-CM

## 2017-02-17 DIAGNOSIS — N390 Urinary tract infection, site not specified: Principal | ICD-10-CM

## 2017-02-17 LAB — CBC
HEMATOCRIT: 38.6 % — AB (ref 39.0–52.0)
HEMOGLOBIN: 11.7 g/dL — AB (ref 13.0–17.0)
MCH: 27 pg (ref 26.0–34.0)
MCHC: 30.3 g/dL (ref 30.0–36.0)
MCV: 89.1 fL (ref 78.0–100.0)
Platelets: 204 10*3/uL (ref 150–400)
RBC: 4.33 MIL/uL (ref 4.22–5.81)
RDW: 15.5 % (ref 11.5–15.5)
WBC: 16.1 10*3/uL — ABNORMAL HIGH (ref 4.0–10.5)

## 2017-02-17 LAB — URINE CULTURE

## 2017-02-17 LAB — GLUCOSE, CAPILLARY
Glucose-Capillary: 175 mg/dL — ABNORMAL HIGH (ref 65–99)
Glucose-Capillary: 124 mg/dL — ABNORMAL HIGH (ref 65–99)

## 2017-02-17 LAB — BASIC METABOLIC PANEL
Anion gap: 6 (ref 5–15)
BUN: 20 mg/dL (ref 6–20)
CHLORIDE: 107 mmol/L (ref 101–111)
CO2: 25 mmol/L (ref 22–32)
CREATININE: 1.55 mg/dL — AB (ref 0.61–1.24)
Calcium: 8.1 mg/dL — ABNORMAL LOW (ref 8.9–10.3)
GFR calc non Af Amer: 40 mL/min — ABNORMAL LOW (ref >60)
GFR, EST AFRICAN AMERICAN: 47 mL/min — AB (ref >60)
GLUCOSE: 163 mg/dL — AB (ref 65–99)
Potassium: 3.9 mmol/L (ref 3.5–5.1)
Sodium: 138 mmol/L (ref 135–145)

## 2017-02-17 MED ORDER — CEPHALEXIN 500 MG PO CAPS
500.0000 mg | ORAL_CAPSULE | Freq: Three times a day (TID) | ORAL | Status: DC
  Administered 2017-02-17: 500 mg via ORAL
  Filled 2017-02-17: qty 1

## 2017-02-17 MED ORDER — CEPHALEXIN 500 MG PO CAPS: 500.0000 mg | ORAL_CAPSULE | Freq: Three times a day (TID) | ORAL | 0 refills | Status: DC

## 2017-02-17 NOTE — Progress Notes (Signed)
Pt being discharged per orders from MD. Pt and son educated on discharge instructions. Pt and son verbalized understanding of instructions. All questions and concerns were addressed. Pt's IV's were removed prior to discharge. Pt exited hospital via wheelchair.

## 2017-02-17 NOTE — Progress Notes (Signed)
Family Medicine Teaching Service Daily Progress Note Intern Pager: [phone]  Patient name: Antonio Ray Medical record number: [id number] Date of birth: 1934/01/21 Age: 83 y.o. Gender: male  Primary Care Provider: [name], MD Consultants: none Code Status: full  Pt Overview and Major Events to Date:  Antonio Ray is a 83 y.o. male w/ PMH of achalasia, COPD, HFrEF, short term memory loss, HLD, Type II DM, BPH presenting with one day history of altered mental status. Was last heard and seen normal around noon on 7/19. Found confused with no pants on, and unable to ambulate at 1630 on 7/19. Brought to emergency department for code stroke. CT scan with no intracranial abnormalities. Febrile to 102.6 in ed, ua with bacteria/Leukocytes along with burning with urination. Presumed to have UTI, started on rocephin in ed. Altered mental status much improved after receiving abx. Urine cx with 100k CFU of citrobacter. Will switch to po abx when sensitivities result and likely dc 7/20.  Assessment and Plan: Altered Mental Status Patient presents with altered mental status beginning in pm of 7/18. Has been having burning urination overnight of 7/17. CT scan (-) for any intracranial bleed, mass, or abnormality. WBC of 25, ua with leuks/bacteria. Febrile to 102.6 in ED. Received 1 dose of rocephin in ed. Now Aox3, but some slight drowsiness and slowness with responses. It seems very likely that the altered mental status is 2/2 to a uti, especially given similar symptoms with uti in the past. Other possible etiologies are stroke, seizures, electrolyte abnormalities, medications. These are all very unlikely given time course, prior tests/imaging, and history. CXR also negative for infiltrate or consolidation that would be concerning for PNA. Patient has not had any new cough or SOB. Will plan to continue to treat uti with iv antibiotics until culture sensitivities result. Neurology evaluated patient in ED  and felt that his presentation was most consistent with infectious etiology given fever. They were not concerned for CNS infection. Afebrile overnight from 7/18-7/19. Urine cx with 100k CFU of citrobacter. Will switch to po abx when sensitivities result and likely dc 7/20. - vital per floor routine - follow up urine culture/blood culture - continue rocephin for now, likely switch to po today when culture - monitor temperature curve - neuro checks q 4 hours  Leukocytosis: Has been off Prednisone taper for 1.5 weeks. Had WBC of 18 on last admission discharge on 7/8 due to prednisone for COPD exacerbation Patient with wbc and bacteria on ua. Chest xray with no areas consistent with pneumonia. Patient with burning on urination. Blood cultures taken in emergency department. Received 1 dose of rocephin in er. Given that patient has evidence of infection on UA, has had improvement on antibiotics in er it is likely that leukocytosis is due to infection. WBC improved from 25 to 21, on one day of antibiotics. Most likely source is uti. Blood cx (-) at less than 48 hours. Urine cx with 100k CFU of citrobacter. Will switch to po abx when sensitivities result and likely dc 7/20.  - Monitor temperature - follow mental status - will switch to po abx when cultures result this am - blood cx (-) at 48 hours - urine culture w/ 100k cfu, - can likely dc today pending pt/ot eval  Chronic Kidney Disease  Creatinine of 2.17 on admission, creatinine of 1.86 on admission on 7/6. Will watch fluid status carefully. Currently tolerating Po. Creatinine down to 1.57. Given low bp and high HR on admission likely  hypovolemic on presentation. Will plan on restart lasix today. - restart home lasix - tolerating po   Type II diabetes Mellitus Patient with glucose of 190 on admission, and hgb A1c of 7.9 at last check in April 2018. Blood glucose 139 in am of 7/19. Takes insulin novolog 6 u breakfast, 5u lunch/dinner. Takes lantus  30U in morning. Recent history of steroid use and likely infection. Will start diabetic diet when tolerating PO. Will put on SSI while in hospital and give lantus 10U in am. - diabetic diet - f/u hgb a1c - blood glucose with daily bmp - fingerstick with meals and at night  COPD Recently hospitalized with copd exacerbation on 7/6. Was discharged on steroid taper and 3 days of azithro. Since discharge patient has been doing very well from a respiratory standpoint. Will plan to continue home spiriva, symbicort, albuterol - continue home spiriva, symbicort, albuterol  HErEF: Followed as an outpatient by dr. Donneta Romberg Last echo on 04/2016 showed EF of 20-25%. Currently takes 20mg  lasix at home. Dry weight 181-183. Weight this AM 179 lbs.  - Will hold lasix - Monitor Fluid status - Will do gentle iv resuscitation given   Hyperlipidemia Will continue home statin when patient tolerating PO - continue pravastatin - holding home fenofibrate due to CrCl <30    Short Term Memory Loss Currently taking Namenda and Aricept. Lives alone and functions independently requiring only minimal help from children for ADLs. - Will continue namenda, aricept - Will monitor mental status - Likely ok for dc home when AMS resolves pending clinical improvement - OT/PT to assess ambulatory status  Achalasia? Is followed as an outpatient by Dr. Silverio Decamp. Has a history of balloon dilatation. Regurgitates both solids and liquids. Monitor for aspiration risk. Speech to evaluate for swallow study in setting of AMS. - MBSS today  BPH Has diagnosis of bph but all meds make him dizzy per son. Will continue to monitor for s/s of retention.  FEN/GI: heart healthy/diabetic, protonix Prophylaxis: lovenox  Disposition: home  Subjective:  Doing much better today, AOx4. No complaints. Passed MBSS, tolerating regular diet. Ambulating with assist. Having bm, urinating well.  Objective: Temp:  [98.3 F (36.8 C)-99.5  F (37.5 C)] 99 F (37.2 C) (07/20 0450) Pulse Rate:  [52-91] 70 (07/20 0450) Resp:  [18] 18 (07/20 0450) BP: (105-121)/(44-82) 105/44 (07/20 0450) SpO2:  [93 %-100 %] 93 % (07/20 0450) Physical Exam: General: alert, oriented x3. Resting comfortably. alert ENTM: external ear with no signs of trauma, external nose no signs of trauma, good dentition Cardiovascular: regular rate, regular rhythm, no murmurs, gallops, rubs appreciated Respiratory: scattered bilateral wheezes Gastrointestinal: soft, non-tender, non-distended, obese, circular raised skin pigment noted on mid-abdomen MSK: able to move all four extremities, 5/5 strength extension/flexion BUE/BLE, 5/5 strength hands and toes bilaterally. Derm: warm, dry Neuro: A&Ox3. Alertness improved, drowsy, no neuro deficits appreciated in extremities, CN2-12 intact Psych: appropriate, pleasant  Laboratory:  Recent Labs Lab 02/15/17 1738 02/16/17 0509 02/17/17 0346  WBC 25.2* 20.8* 16.1*  HGB 13.6 12.2* 11.7*  HCT 42.8 39.6 38.6*  PLT 219 197 204    Recent Labs Lab 02/15/17 1738 02/16/17 0509 02/17/17 0346  NA 138 141 138  K 4.0 3.8 3.9  CL 101 108 107  CO2 26 25 25   BUN 31* 29* 20  CREATININE 2.17* 1.75* 1.55*  CALCIUM 8.8* 8.1* 8.1*  PROT 6.8  --   --   BILITOT 0.8  --   --   ALKPHOS 52  --   --  ALT 23  --   --   AST 27  --   --   GLUCOSE 190* 139* 163*    Imaging/Diagnostic Tests: CLINICAL DATA:  Code stroke. 83 y/o M; aphasia and altered mental status.  EXAM: CT HEAD WITHOUT CONTRAST  TECHNIQUE: Contiguous axial images were obtained from the base of the skull through the vertex without intravenous contrast.  COMPARISON:  None.  FINDINGS: Brain: No evidence of acute infarction, hemorrhage, hydrocephalus, extra-axial collection or mass lesion/mass effect. Mild chronic microvascular ischemic changes and parenchymal volume loss of the brain.  Vascular: Calcific atherosclerosis of carotid siphons.  No hyperdense vessel identified.  Skull: Normal. Negative for fracture or focal lesion.  Sinuses/Orbits: No acute finding.  Other: None.  ASPECTS [facility] Stroke Program Early CT Score)  - Ganglionic level infarction (caudate, lentiform nuclei, internal capsule, insula, M1-M3 cortex): 7  - Supraganglionic infarction (M4-M6 cortex): 3  Total score (0-10 with 10 being normal): 10  IMPRESSION: 1. No acute intracranial abnormality identified. 2. ASPECTS is 10 3. Mild for age chronic microvascular ischemic changes and mild parenchymal volume loss of the brain. These results were called by telephone at the time of interpretation on 02/15/2017 at 5:42 pm to Dr. Leonel Ramsay, who verbally acknowledged these results.  CLINICAL DATA:  Unsteady gait with abnormal speech and confusion.  EXAM: PORTABLE CHEST 1 VIEW  COMPARISON:  02/02/2017  FINDINGS: Dual lead pacer with lead tips projecting over the expected locations of the right atrium and ventricle.  Atherosclerotic calcification of the aortic arch.  Mild biapical pleuroparenchymal scarring. The lungs appear otherwise clear.  Heart size within normal limits.  IMPRESSION: 1.  No active cardiopulmonary disease is radiographically apparent. 2.  Aortic Atherosclerosis (ICD10-I70.0).  Guadalupe Dawn, MD 02/17/2017, 9:25 AM PGY-1, [place] Intern pager: [phone], text pages welcome

## 2017-02-17 NOTE — Discharge Instructions (Signed)
Urinary Tract Infection, Adult A urinary tract infection (UTI) is an infection of any part of the urinary tract, which includes the kidneys, ureters, bladder, and urethra. These organs make, store, and get rid of urine in the body. UTI can be a bladder infection (cystitis) or kidney infection (pyelonephritis). What are the causes? This infection may be caused by fungi, viruses, or bacteria. Bacteria are the most common cause of UTIs. This condition can also be caused by repeated incomplete emptying of the bladder during urination. What increases the risk? This condition is more likely to develop if:  You ignore your need to urinate or hold urine for long periods of time.  You do not empty your bladder completely during urination.  You wipe back to front after urinating or having a bowel movement, if you are male.  You are uncircumcised, if you are male.  You are constipated.  You have a urinary catheter that stays in place (indwelling).  You have a weak defense (immune) system.  You have a medical condition that affects your bowels, kidneys, or bladder.  You have diabetes.  You take antibiotic medicines frequently or for long periods of time, and the antibiotics no longer work well against certain types of infections (antibiotic resistance).  You take medicines that irritate your urinary tract.  You are exposed to chemicals that irritate your urinary tract.  You are male.  What are the signs or symptoms? Symptoms of this condition include:  Fever.  Frequent urination or passing small amounts of urine frequently.  Needing to urinate urgently.  Pain or burning with urination.  Urine that smells bad or unusual.  Cloudy urine.  Pain in the lower abdomen or back.  Trouble urinating.  Blood in the urine.  Vomiting or being less hungry than normal.  Diarrhea or abdominal pain.  Vaginal discharge, if you are male.  How is this diagnosed? This condition is  diagnosed with a medical history and physical exam. You will also need to provide a urine sample to test your urine. Other tests may be done, including:  Blood tests.  Sexually transmitted disease (STD) testing.  If you have had more than one UTI, a cystoscopy or imaging studies may be done to determine the cause of the infections. How is this treated? Treatment for this condition often includes a combination of two or more of the following:  Antibiotic medicine.  Other medicines to treat less common causes of UTI.  Over-the-counter medicines to treat pain.  Drinking enough water to stay hydrated.  Follow these instructions at home:  Take over-the-counter and prescription medicines only as told by your health care provider.  If you were prescribed an antibiotic, take it as told by your health care provider. Do not stop taking the antibiotic even if you start to feel better.  Avoid alcohol, caffeine, tea, and carbonated beverages. They can irritate your bladder.  Drink enough fluid to keep your urine clear or pale yellow.  Keep all follow-up visits as told by your health care provider. This is important.  Make sure to: ? Empty your bladder often and completely. Do not hold urine for long periods of time. ? Empty your bladder before and after sex. ? Wipe from front to back after a bowel movement if you are male. Use each tissue one time when you wipe. Contact a health care provider if:  You have back pain.  You have a fever.  You feel nauseous or vomit.  Your symptoms do not  get better after 3 days.  Your symptoms go away and then return. Get help right away if:  You have severe back pain or lower abdominal pain.  You are vomiting and cannot keep down any medicines or water. This information is not intended to replace advice given to you by your health care provider. Make sure you discuss any questions you have with your health care provider. Document Released:  04/27/2005 Document Revised: 12/30/2015 Document Reviewed: 06/08/2015 Elsevier Interactive Patient Education  2017 [employer].    Antibiotic Medicine, Adult Antibiotic medicines treat infections caused by a type of germ called bacteria. They work by killing the bacteria that make you sick. When do I need to take antibiotics? You often need these medicines to treat bacterial infections, such as:  A urinary tract infection (UTI).  Strep throat.  Meningitis. This affects the spinal cord and brain.  A bad lung infection.  You may start the medicines while your doctor waits for tests to come back. When the tests come back, your doctor may change or stop your medicine. When are antibiotics not needed? You do not need these medicines for most common illnesses, such as:  A cold.  The flu.  A sore throat.  Antibiotics are not always needed for all infections caused by bacteria. Do not ask for these medicines, or take them, when they are not needed. What are the risks of taking antibiotics? Most antibiotics can cause an infection called Clostridium difficile.This causes watery poop (diarrhea). Let your doctor know right away if:  You have watery poop while taking an antibiotic.  You have watery poop after you stop taking an antibiotic. The illness can happen weeks after you stop the medicine.  You also have a risk of getting an infection in the future that antibiotics cannot treat (antibiotic-resistant infection). This type of infection can be dangerous. What else should I know about taking antibiotics?  You need to take the entire prescription. ? Take the medicine for as long as told by your doctor. ? Do not stop taking it even if you start to feel better.  Try not to miss any doses. If you miss a dose, call your doctor.  Birth control pills may not work. If you take birth control pills: ? Keep on taking them. ? Use a second form of birth control, such as a condom. Do this  for as long as told by your doctor.  Ask your doctor: ? How long to wait in between doses. ? If you should take the medicine with food. ? If there is anything you should stay away from while taking the antibiotic, such as: ? Food. ? Drinks. ? Medicines. ? If there are any side effects you should watch for.  Only take the medicines that your doctor told you to take. Do not take medicines that were given to someone else.  Drink a large glass of water with the medicine.  Ask the pharmacist for a tool to measure the medicine, such as: ? A syringe. ? A cup. ? A spoon.  Throw away any extra medicine. Contact a doctor if:  You get worse.  You have new joint pain or muscle aches after starting the medicine.  You have side effects from the medicine, such as: ? Stomach pain. ? Watery poop. ? Feeling sick to your stomach (nausea). Get help right away if:  You have signs of a very bad allergic reaction. If this happens, stop taking the medicine right away. Signs  may include: ? Hives. These are raised, itchy, red bumps on the skin. ? Skin rash. ? Trouble breathing. ? Wheezing. ? Swelling. ? Feeling dizzy. ? Throwing up (vomiting).  Your pee (urine) is dark, or is the color of blood.  Your skin turns yellow.  You bruise easily.  You bleed easily.  You have very bad watery poop and cramps in your belly.  You have a very bad headache. Summary  Antibiotics are often used to treat infections caused by bacteria.  Only take these medicines when needed.  Let your doctor know if you have watery poop while taking an antibiotic.  You need to take the entire prescription. This information is not intended to replace advice given to you by your health care provider. Make sure you discuss any questions you have with your health care provider. Document Released: 04/26/2008 Document Revised: 07/20/2016 Document Reviewed: 07/20/2016 Elsevier Interactive Patient Education  2017  [employer].

## 2017-02-17 NOTE — Discharge Summary (Signed)
[facility] Discharge Summary  Patient name: Antonio Ray Medical record number: [id number] Date of birth: Jul 16, 1934 Age: 83 y.o. Gender: male Date of Admission: 02/15/2017  Date of Discharge: 02/17/2017 Admitting Physician: Leeanne Rio, MD  Primary Care Provider: [name], MD Consultants: none  Indication for Hospitalization: altered mental status  Discharge Diagnoses/Problem List:  Altered mental status Urinary tract infection Chronic kidney disease T2DM COPD HFrEF Short Term memory loss Achalsia BPH  Disposition: home  Discharge Condition: good  Discharge Exam: General: alert, oriented x3. Resting comfortably. alert ENTM: external ear with no signs of trauma, external nose no signs of trauma, good dentition Cardiovascular: regular rate, regular rhythm, no murmurs, gallops, rubs appreciated Respiratory: scattered bilateral wheezes Gastrointestinal: soft, non-tender, non-distended, obese, circular raised skin pigment noted on mid-abdomen MSK: able to move all four extremities, 5/5 strength extension/flexion BUE/BLE, 5/5 strength hands and toes bilaterally. Derm: warm, dry Neuro: A&Ox3. Alertness improved, drowsy, no neuro deficits appreciated in extremities, CN2-12 intact Psych: appropriate, pleasant  Brief Hospital Course:  Antonio Ray a 83 y.o.malew/ PMH of achalasia, COPD, HFrEF, short term memory loss, HLD, Type II DM, BPH presenting with one day history of altered mental status. Was last heard and seen normal around noon on 7/19. Found confused with no pants on, and unable to ambulate at 1630 on 7/19. Brought to emergency department for code stroke. CT scan with no intracranial abnormalities. Febrile to 102.6 in ed, ua with bacteria/Leukocytes along with burning with urination. Presumed to have UTI, started on rocephin in ed. Altered mental status much improved after receiving abx. Received rocephin until 7/20. Was  switched to keflex at that time. Urine cx with 100k CFU of citrobacter.  Discharged with 5 more days of keflex to complete a 7 day course of antibiotics.  Issues for Follow Up:  1. 5 more days of keflex  Significant Procedures: none  Significant Labs and Imaging:   Recent Labs Lab 02/15/17 1738 02/16/17 0509 02/17/17 0346  WBC 25.2* 20.8* 16.1*  HGB 13.6 12.2* 11.7*  HCT 42.8 39.6 38.6*  PLT 219 197 204    Recent Labs Lab 02/15/17 1729 02/15/17 1738 02/16/17 0509 02/17/17 0346  NA 139 138 141 138  K 4.1 4.0 3.8 3.9  CL 100* 101 108 107  CO2  --  26 25 25   GLUCOSE 191* 190* 139* 163*  BUN 35* 31* 29* 20  CREATININE 2.10* 2.17* 1.75* 1.55*  CALCIUM  --  8.8* 8.1* 8.1*  ALKPHOS  --  52  --   --   AST  --  27  --   --   ALT  --  23  --   --   ALBUMIN  --  3.0*  --   --      Results/Tests Pending at Time of Discharge: none  Discharge Medications:  Allergies as of 02/17/2017      Reactions   Codeine Other (See Comments)   Gets very angry, disoriented   Dilaudid [hydromorphone Hcl] Other (See Comments)   VERY AGITATED, HOSTILE   Flomax [tamsulosin Hcl] Shortness Of Breath   Morphine And Related Other (See Comments)   VERY AGITATED, HOSTILE   Sulfa Antibiotics Shortness Of Breath   Beta Adrenergic Blockers Other (See Comments)   Disorientation   Carvedilol Other (See Comments)   DISORIENTATION      Medication List    STOP taking these medications   azithromycin 500 MG tablet Commonly known as:  ZITHROMAX   predniSONE 10 MG tablet Commonly known as:  DELTASONE     TAKE these medications   albuterol 108 (90 Base) MCG/ACT inhaler Commonly known as:  PROVENTIL HFA;VENTOLIN HFA Inhale 2 puffs into the lungs every 6 (six) hours as needed for wheezing or shortness of breath.   aspirin 81 MG EC tablet Take 81 mg by mouth daily.   BASAGLAR KWIKPEN 100 UNIT/ML Sopn INJECT 30 UNITS UNDER THE SKIN EVERY MORNING What changed:  See the new instructions.    cephALEXin 500 MG capsule Commonly known as:  KEFLEX Take 1 capsule (500 mg total) by mouth 3 (three) times daily.   donepezil 10 MG tablet Commonly known as:  ARICEPT Take 1 tablet (10 mg total) by mouth every morning.   fenofibrate 145 MG tablet Commonly known as:  TRICOR TAKE 1 TABLET BY MOUTH EVERY DAY What changed:  See the new instructions.   fluoruracil 0.5 % cream Commonly known as:  CARAC Apply 1 application topically 2 (two) times daily. TO AFFECTED AREA   furosemide 20 MG tablet Commonly known as:  LASIX Take 2 tablets (40 mg total) by mouth daily.   insulin aspart 100 UNIT/ML FlexPen Commonly known as:  NOVOLOG FLEXPEN Inject 5-6 Units into the skin See admin instructions. Inject 6 units SQ with breakfast, 5 units SQ with lunch and 5 units SQ with dinner What changed:  how much to take  additional instructions   loratadine 10 MG tablet Commonly known as:  CLARITIN Take 1 tablet (10 mg total) by mouth daily.   memantine 10 MG tablet Commonly known as:  NAMENDA Take 1 tablet (10 mg total) by mouth 2 (two) times daily.   nitroGLYCERIN 0.2 mg/hr patch Commonly known as:  NITRODUR - Dosed in mg/24 hr Place 1 patch (0.2 mg total) onto the skin daily. Alternates patch placement with odd/even days. Even days on left side, odd days on right side.   pantoprazole 20 MG tablet Commonly known as:  PROTONIX TAKE 1 TABLET BY MOUTH DAILY What changed:  See the new instructions.   pravastatin 40 MG tablet Commonly known as:  PRAVACHOL Take 1 tablet (40 mg total) by mouth daily. What changed:  when to take this   PRESERVISION AREDS 2 Caps Take 1 capsule by mouth 2 (two) times daily.   SPIRIVA HANDIHALER 18 MCG inhalation capsule Generic drug:  tiotropium INHALE CONTENTS OF 1 CAPSULE VIA HANDIHALER ONCE DAILY   SYMBICORT 160-4.5 MCG/ACT inhaler Generic drug:  budesonide-formoterol INHALE 2 PUFFS BY MOUTH TWICE DAILY   Vitamin D 2000 units tablet Take 2,000  Units by mouth daily.       Discharge Instructions: Please refer to Patient Instructions section of EMR for full details.  Patient was counseled important signs and symptoms that should prompt return to medical care, changes in medications, dietary instructions, activity restrictions, and follow up appointments.   Follow-Up Appointments: Please follow up with pcp in 1-2 weeks   Guadalupe Dawn, MD 02/17/2017, 11:20 AM PGY-1, [place]

## 2017-02-17 NOTE — Evaluation (Signed)
Physical Therapy Evaluation [name] MRN: [id number] DOB: 08/18/1933 Today's Date: 02/17/2017   History of Present Illness  Antonio Ray is a 83 y.o. male w/ PMH of achalasia, COPD, HFrEF, short term memory loss, HLD, Type II DM, BPH presenting with one day history of altered mental status.  Found to have UTI.  Clinical Impression  Pt is at or close to baseline functioning and should be safe at home . There are no further acute PT needs.  Will sign off at this time.     Follow Up Recommendations No PT follow up    Equipment Recommendations  None recommended by PT    Recommendations for Other Services       Precautions / Restrictions Precautions Precautions: Fall      Mobility  Bed Mobility                  Transfers Overall transfer level: Modified independent                  Ambulation/Gait Ambulation/Gait assistance: Independent (in homelike environment) Ambulation Distance (Feet): 350 Feet Assistive device: None Gait Pattern/deviations: Step-through pattern Gait velocity: slower, but can incrementally speed up over time. Gait velocity interpretation: at or above normal speed for age/gender General Gait Details: stiff and mildly unsteady on occasion, but no LOB  Stairs Stairs: Yes   Stair Management: One rail Right;Alternating pattern;Step to pattern;Forwards Number of Stairs: 7 General stair comments: safe if using rails  Wheelchair Mobility    Modified Rankin (Stroke Patients Only)       Balance Overall balance assessment: Needs assistance   Sitting balance-Leahy Scale: Good       Standing balance-Leahy Scale: Good                   Standardized Balance Assessment Standardized Balance Assessment : Dynamic Gait Index   Dynamic Gait Index Level Surface: Normal Change in Gait Speed: Mild Impairment Gait with Horizontal Head Turns: Normal Gait with Vertical Head Turns: Normal Gait and Pivot  Turn: Normal Step Over Obstacle: Mild Impairment Step Around Obstacles: Mild Impairment Steps: Mild Impairment Total Score: 20       Pertinent Vitals/Pain Pain Assessment: No/denies pain (stiff only)    Home Living Family/patient expects to be discharged to:: Private residence Living Arrangements: Alone Available Help at Discharge: Family;Available PRN/intermittently Type of Home: House Home Access: Stairs to enter Entrance Stairs-Rails: Left;Right;Can reach both Entrance Stairs-Number of Steps: 3-7 Home Layout: Two level;Able to live on main level with bedroom/bathroom;Full bath on main level Home Equipment: Cane - single point;Walker - 2 wheels;Walker - 4 wheels Additional Comments: 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     Prior Function Level of Independence: Independent with assistive device(s)         Comments: uses cane only when he goes out and walks in the woods.  Does not use O2 at baseline.      Hand Dominance   Dominant Hand: Right    Extremity/Trunk Assessment   Upper Extremity Assessment Upper Extremity Assessment: Overall WFL for tasks assessed (general weakness extension weaker than flexion)    Lower Extremity Assessment Lower Extremity Assessment: Overall WFL for tasks assessed (general proximal weakness R weaker than L )       Communication   Communication: HOH  Cognition Arousal/Alertness: Awake/alert Behavior During Therapy: WFL for tasks assessed/performed Overall Cognitive Status: Within Functional Limits for tasks assessed (but likely not quite baseline)  General Comments      Exercises     Assessment/Plan    PT Assessment Patent does not need any further PT services  PT Problem List         PT Treatment Interventions      PT Goals (Current goals can be found in the Care Plan section)  Acute Rehab PT Goals PT Goal Formulation: All assessment and education complete, DC  therapy    Frequency     Barriers to discharge        Co-evaluation               AM-PAC PT "6 Clicks" Daily Activity  Outcome Measure Difficulty turning over in bed (including adjusting bedclothes, sheets and blankets)?: A Little Difficulty moving from lying on back to sitting on the side of the bed? : A Little Difficulty sitting down on and standing up from a chair with arms (e.g., wheelchair, bedside commode, etc,.)?: A Little Help needed moving to and from a bed to chair (including a wheelchair)?: None Help needed walking in hospital room?: None Help needed climbing 3-5 steps with a railing? : None 6 Click Score: 21    End of Session   Activity Tolerance: Patient tolerated treatment well Patient left: in chair;with call bell/phone within reach;with chair alarm set Nurse Communication: Mobility status PT Visit Diagnosis: Unsteadiness on feet (R26.81)    Time: 4628-6381 PT Time Calculation (min) (ACUTE ONLY): 22 min   Charges:   PT Evaluation $PT Eval Low Complexity: 1 Procedure     PT G Codes:        03-02-2017  Donnella Sham, PT [phone]  (pager)  Antonio Ray 03-02-17, 10:17 AM

## 2017-02-20 LAB — CULTURE, BLOOD (ROUTINE X 2)
CULTURE: NO GROWTH
Culture: NO GROWTH
Special Requests: ADEQUATE
Special Requests: ADEQUATE

## 2017-02-26 ENCOUNTER — Other Ambulatory Visit: Payer: Self-pay | Admitting: Gastroenterology

## 2017-03-01 ENCOUNTER — Ambulatory Visit (INDEPENDENT_AMBULATORY_CARE_PROVIDER_SITE_OTHER): Payer: Medicare Other | Admitting: Internal Medicine

## 2017-03-01 ENCOUNTER — Encounter: Payer: Self-pay | Admitting: Internal Medicine

## 2017-03-01 VITALS — BP 122/62 | HR 66 | Ht 68.0 in | Wt 183.0 lb

## 2017-03-01 DIAGNOSIS — N183 Chronic kidney disease, stage 3 unspecified: Secondary | ICD-10-CM

## 2017-03-01 DIAGNOSIS — Z794 Long term (current) use of insulin: Secondary | ICD-10-CM | POA: Diagnosis not present

## 2017-03-01 DIAGNOSIS — H353121 Nonexudative age-related macular degeneration, left eye, early dry stage: Secondary | ICD-10-CM | POA: Diagnosis not present

## 2017-03-01 DIAGNOSIS — E785 Hyperlipidemia, unspecified: Secondary | ICD-10-CM | POA: Diagnosis not present

## 2017-03-01 DIAGNOSIS — E119 Type 2 diabetes mellitus without complications: Secondary | ICD-10-CM | POA: Diagnosis not present

## 2017-03-01 DIAGNOSIS — Z961 Presence of intraocular lens: Secondary | ICD-10-CM | POA: Diagnosis not present

## 2017-03-01 DIAGNOSIS — I255 Ischemic cardiomyopathy: Secondary | ICD-10-CM

## 2017-03-01 DIAGNOSIS — H353111 Nonexudative age-related macular degeneration, right eye, early dry stage: Secondary | ICD-10-CM | POA: Diagnosis not present

## 2017-03-01 DIAGNOSIS — E1122 Type 2 diabetes mellitus with diabetic chronic kidney disease: Secondary | ICD-10-CM

## 2017-03-01 DIAGNOSIS — H5201 Hypermetropia, right eye: Secondary | ICD-10-CM | POA: Diagnosis not present

## 2017-03-01 DIAGNOSIS — H5212 Myopia, left eye: Secondary | ICD-10-CM | POA: Diagnosis not present

## 2017-03-01 DIAGNOSIS — H52223 Regular astigmatism, bilateral: Secondary | ICD-10-CM | POA: Diagnosis not present

## 2017-03-01 DIAGNOSIS — H26493 Other secondary cataract, bilateral: Secondary | ICD-10-CM | POA: Diagnosis not present

## 2017-03-01 LAB — POCT GLYCOSYLATED HEMOGLOBIN (HGB A1C): HEMOGLOBIN A1C: 6.9

## 2017-03-01 MED ORDER — INSULIN ASPART 100 UNIT/ML FLEXPEN: 5.0000 [IU] | PEN_INJECTOR | SUBCUTANEOUS | 11 refills | Status: DC

## 2017-03-01 MED ORDER — BASAGLAR KWIKPEN 100 UNIT/ML ~~LOC~~ SOPN: PEN_INJECTOR | SUBCUTANEOUS | 0 refills | Status: DC

## 2017-03-01 NOTE — Patient Instructions (Addendum)
Please continue: - Basaglar 20 units in am. - Novolog (10-15 min before a meal): - 7-8 units for b'fast - 5-7 units for lunch and dinner  Please return in 3 months with your sugar log.

## 2017-03-01 NOTE — Progress Notes (Signed)
Patient ID: ACELIN FERDIG, male   DOB: 07/22/1934, 83 y.o.   MRN: [id number]   HPI: MANSOUR BALBOA is a 83 y.o.-year-old male, returning for f/u for DM2, dx in 2010, insulin-dependent since ~2012, uncontrolled, with complications (PN, CKD stage 3-4, CAD, CHF - ICD). He is here with his son who offers most of the history, especially regarding blood sugar checks and insulin dosing. Last visit 3.5 mo ago.  He was admitted w/ COPD exacerbation and then UTI with delirium - this mo.  Last hemoglobin A1c was: Lab Results  Component Value Date   HGBA1C 7.9 (H) 11/11/2016   HGBA1C 9.9 07/19/2016   HGBA1C 10.2 (H) 04/21/2016   He had a lot of steroids for COPD. He had a severe PNA episode in 2016 2/2 aspiration 2/2 achalasia. He had several admissions >> on repeated Solumedrol doses. Since then >> occasional COPD exacerbations >> Solumedrol + prednisone tapers - Not in last few months  He iss on: - Basaglar 30 >> 20 units in am. - Novolog (10-15 min before a meal): - 7-8 units for b'fast - 5-7 units for lunch and dinner He added 1-2 units to Novolog to the mealtime insulin if on steroids.  Pt checks his sugars 3x a day: - am: 118-189, 222 >> 120-151 >> 109-152 >> 93-148, 151, 217 - 2h after b'fast: n/c >> 151-246 >> n/c - before lunch: 175-260 >> 100-188, 209 >> 96-162, 187 >> 112, 133-173, 190, 226 - 2h after lunch: n/c >> 93, 106-180, 233 >> n/c - before dinner: 135-191, 188, 216 (granola bar, cookies) >> 90-178 >> 92, 97, 115-178, 229, 259 (prednisone) - 2h after dinner: n/c >> 107-176, 211, 240 >> n/c - bedtime: n/c >> 114-187 >> n/c - nighttime: n/c No lows. Lowest sugar was 99 >> 97 >> 92 Highest sugar was HI >> 287 >> 240  >> 259  Glucometer: Freestyle Lite  Pt's meals are: - Breakfast: bacon, eggs, hash browns (5-6 units) - Lunch: sandwich or soup - Dinner: varies, meat + veggies - Snacks: 1-2  He walks daily.  - + has CKD, last BUN/creatinine:  Lab Results  Component  Value Date   BUN 20 02/17/2017   BUN 29 (H) 02/16/2017   CREATININE 1.55 (H) 02/17/2017   CREATININE 1.75 (H) 02/16/2017   - last set of lipids: 02/05/2016: 144/223/40/59 Lab Results  Component Value Date   CHOL 136 06/20/2013   HDL 32.00 (L) 06/20/2013   LDLCALC 76 06/20/2013   TRIG 142.0 06/20/2013   CHOLHDL 4 06/20/2013  On Pravastatin - last eye exam was in 03/2016 >> ? DR. Dr. Marica Otter. Had cataract Sx in 2016. + macular degeneration. Has a foot ulcer 11/2014 >> healed well (2016). He has DM shoes. R foot more swollen after his fx >> 20 years ago. He Is seen by a podiatrist. - denies numbness and tingling in his feet.  ROS: Constitutional: + weight loss, no fatigue, no subjective hyperthermia, no subjective hypothermia, + continues to have burning with urination Eyes: no blurry vision, no xerophthalmia ENT: no sore throat, no nodules palpated in throat, no dysphagia, no odynophagia, no hoarseness Cardiovascular: no CP/+ SOB/no palpitations/no leg swelling Respiratory: no cough/+ SOB/no wheezing Gastrointestinal: no N/no V/no D/no C/no acid reflux Musculoskeletal: no muscle aches/no joint aches Skin: no rashes, no hair loss Neurological: no tremors/no numbness/no tingling/no dizziness  I reviewed pt's medications, allergies, PMH, social hx, family hx, and changes were documented in the history of present illness.  Otherwise, unchanged from my initial visit note.   Past Medical History:  Diagnosis Date  . Achalasia   . AICD (automatic cardioverter/defibrillator) present 03/30/2011   Analyze ST study patient  . Anginal pain ([place])   . BPH (benign prostatic hyperplasia)   . CHF (congestive heart failure) ([place])   . Chronic systolic dysfunction of left ventricle    EF 30-35%, CLASS II - III SYMPTOMS; intolerant to Coreg  . CKD (chronic kidney disease) stage 3, GFR 30-59 ml/min 06/08/2012  . COPD (chronic obstructive pulmonary disease) ([place])   . Coronary artery disease     History of remote anterior MI with PCI to LAD in 2006; most recent cath 2007, no intervention required  . DOE (dyspnea on exertion)    with heavy exertion  . Dysrhythmia   . Esophageal dysmotility   . GERD (gastroesophageal reflux disease)   . GERD (gastroesophageal reflux disease)   . Head injury, closed, with concussion 2000ish  . Heart murmur    hx  . HOH (hard of hearing)   . Hyperlipidemia   . Memory loss    improved  . Myocardial infarction ([place]) 1994   ANTERIOR  . Pneumonia "several times"   aspiration pna with at least 3 admits for this 2016.   Marland Kitchen PVC's (premature ventricular contractions)   . Renal failure   . Skin cancer "several"   "forearms; head"  . Type II diabetes mellitus ([place])    Past Surgical History:  Procedure Laterality Date  . BALLOON DILATION N/A 09/09/2015   Procedure: BALLOON DILATION;  Surgeon: Mauri Pole, MD;  Location: [facility];  Service: Endoscopy;  Laterality: N/A;  rigiflex achalasia balloon dilators  . BOTOX INJECTION N/A 04/08/2015   Procedure: BOTOX INJECTION;  Surgeon: Milus Banister, MD;  Location: [facility];  Service: Endoscopy;  Laterality: N/A;  . CARDIAC CATHETERIZATION  01/11/2006   DEMONSTRATES AKINESIA OF THE DISTAL ANTERIOR WALL, DISTAL INFERIOR WALL AND AKINESIA OF THE APEX. THE BASAL SEGMENTS CONTRACT WELL AND OVERALL EF 35%  . CATARACT EXTRACTION W/ INTRAOCULAR LENS  IMPLANT, BILATERAL  08/2014-09/2014  . CHOLECYSTECTOMY  2/11  . CORONARY ANGIOPLASTY  1994   TO THE LAD  . ESOPHAGEAL MANOMETRY N/A 03/16/2015   Procedure: ESOPHAGEAL MANOMETRY (EM);  Surgeon: Jerene Bears, MD;  Location: [facility];  Service: Gastroenterology;  Laterality: N/A;  . ESOPHAGOGASTRODUODENOSCOPY N/A 04/08/2015   Procedure: ESOPHAGOGASTRODUODENOSCOPY (EGD);  Surgeon: Milus Banister, MD;  Location: [name];  Service: Endoscopy;  Laterality: N/A;  . ESOPHAGOGASTRODUODENOSCOPY (EGD) WITH PROPOFOL N/A 09/09/2015   Procedure:  ESOPHAGOGASTRODUODENOSCOPY (EGD) WITH PROPOFOL;  Surgeon: Mauri Pole, MD;  Location: [facility];  Service: Endoscopy;  Laterality: N/A;  pt. is to have gastrografin xray post recovery-do not discharge until results are back  . EYE SURGERY    . FOOT FRACTURE SURGERY Right 1980's  . INSERT / REPLACE / REMOVE PACEMAKER    . SKIN CANCER EXCISION  "several"   "forearms, head"   Social History   Social History  . Marital status: Widowed    Spouse name: N/A  . Number of children: 2  . Years of education: GED   Occupational History  . 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 Smoking status: Former Smoker    Packs/day: 1.00    Years: 35.00    Types: Cigarettes    Quit date: 08/01/1992  .  Smokeless tobacco: Former Systems developer    Quit date: 1994  . Alcohol use No  . Drug use: No  . Sexual activity: Not Currently   Other Topics Concern  . Not on file   Social History Narrative   Pt lives in [place] alone.  Widowed.   Right-handed.   Rare caffeine use - maybe one cup per month.   Current Outpatient Prescriptions on File Prior to Visit  Medication Sig Dispense Refill  . albuterol (PROVENTIL HFA;VENTOLIN HFA) 108 (90 Base) MCG/ACT inhaler Inhale 2 puffs into the lungs every 6 (six) hours as needed for wheezing or shortness of breath. 1 Inhaler 2  . aspirin 81 MG EC tablet Take 81 mg by mouth daily.      . Cholecalciferol (VITAMIN D) 2000 units tablet Take 2,000 Units by mouth daily.     Marland Kitchen donepezil (ARICEPT) 10 MG tablet Take 1 tablet (10 mg total) by mouth every morning. 90 tablet 3  . fenofibrate (TRICOR) 145 MG tablet TAKE 1 TABLET BY MOUTH EVERY DAY (Patient taking differently: Take 145 mg by mouth once a day) 90 tablet 3  . furosemide (LASIX) 20 MG tablet Take 2 tablets (40 mg total) by mouth daily. 180 tablet 3  . insulin aspart (NOVOLOG FLEXPEN) 100 UNIT/ML FlexPen Inject 5-6 Units into the skin See  admin instructions. Inject 6 units SQ with breakfast, 5 units SQ with lunch and 5 units SQ with dinner (Patient taking differently: Inject 5-7 Units into the skin See admin instructions. Per sliding scale and depending on meal consumption: 6 units into the skin with breakfast then 5 units with lunch then 5 units with dinner (evening meal))    . Insulin Glargine (BASAGLAR KWIKPEN) 100 UNIT/ML SOPN INJECT 30 UNITS UNDER THE SKIN EVERY MORNING (Patient taking differently: Inject 22-30 units into the skin every morning) 15 mL 0  . loratadine (CLARITIN) 10 MG tablet Take 1 tablet (10 mg total) by mouth daily. 30 tablet 1  . memantine (NAMENDA) 10 MG tablet Take 1 tablet (10 mg total) by mouth 2 (two) times daily. 60 tablet 3  . Multiple Vitamins-Minerals (PRESERVISION AREDS 2) CAPS Take 1 capsule by mouth 2 (two) times daily.    . nitroGLYCERIN (NITRODUR - DOSED IN MG/24 HR) 0.2 mg/hr patch Place 1 patch (0.2 mg total) onto the skin daily. Alternates patch placement with odd/even days. Even days on left side, odd days on right side. 30 patch 6  . pantoprazole (PROTONIX) 20 MG tablet TAKE 1 TABLET BY MOUTH DAILY 90 tablet 0  . pravastatin (PRAVACHOL) 40 MG tablet Take 1 tablet (40 mg total) by mouth daily. (Patient taking differently: Take 40 mg by mouth at bedtime. ) 90 tablet 1  . SPIRIVA HANDIHALER 18 MCG inhalation capsule INHALE CONTENTS OF 1 CAPSULE VIA HANDIHALER ONCE DAILY 30 capsule 0  . SYMBICORT 160-4.5 MCG/ACT inhaler INHALE 2 PUFFS BY MOUTH TWICE DAILY 10.2 g 5   No current facility-administered medications on file prior to visit.    Allergies  Allergen Reactions  . Codeine Other (See Comments)    Gets very angry, disoriented  . Dilaudid [Hydromorphone Hcl] Other (See Comments)    VERY AGITATED, HOSTILE  . Flomax [Tamsulosin Hcl] Shortness Of Breath  . Morphine And Related Other (See Comments)    VERY AGITATED, HOSTILE  . Sulfa Antibiotics Shortness Of Breath  . Beta Adrenergic Blockers  Other (See Comments)    Disorientation  . Carvedilol Other (See Comments)    DISORIENTATION  Family History  Problem Relation Age of Onset  . Stroke Father        Mother history unknown - never met her  . Colon cancer Neg Hx     PE: BP 122/62 (BP Location: Left Arm, Patient Position: Sitting)   Pulse 66   Ht 5' 8"  (1.727 m)   Wt 183 lb (83 kg)   SpO2 96%   BMI 27.83 kg/m   Wt Readings from Last 3 Encounters:  03/01/17 183 lb (83 kg)  02/16/17 185 lb (83.9 kg)  02/02/17 183 lb 6.8 oz (83.2 kg)   Constitutional: overweight, in NAD Eyes: PERRLA, EOMI, no exophthalmos ENT: moist mucous membranes, no thyromegaly, no cervical lymphadenopathy Cardiovascular: RRR, No MRG Respiratory: CTA B Gastrointestinal: abdomen soft, NT, ND, BS+ Musculoskeletal: no deformities, strength intact in all 4 Skin: moist, warm, no rashes Neurological: no tremor with outstretched hands, DTR normal in all 4  ASSESSMENT: 1. DM2, insulin-dependent, uncontrolled, with complications - PN - stable - CKD stage 3-4  - CAD, CHF - ICD  2. HL  PLAN:  1. Patient with long-standing, uncontrolled diabetes, usually worse with prednisone treatment (had this since last visit), but now again with sugars mostly at or close to goal. He is taking a lower dose of Basaglar compared to last visit, as he noticed that his sugars were staying under 100 when he was taking the full dose. He did increase his insulin with meals and at time of his steroid injections. He is now taking lower doses than the recommended at last visit, and I noticed that his prelunch sugars are higher. We discussed about increasing the insulin with this meal only.  - I suggested to:  Patient Instructions  Please continue: - Basaglar 20 units in am. - Novolog (10-15 min before a meal): - 7-8 units for b'fast - 5-7 units for lunch and dinner  Please return in 3 months with your sugar log.  - today, HbA1c is 6.9% (better) - continue checking  sugars at different times of the day - check 3x a day, rotating checks  - he is doing a great job with this and brings a good log  - advised for yearly eye exams >> he has an appointment today  - Return to clinic in 3 mo with sugar log   2. HL -  he continues on Lipitor  - no SEs from this - Reviewed records from PCP with pt and his son, latest cholesterol level was from a year ago. He does have an appointment coming up with PCP and will have another lipid level then  Philemon Kingdom, MD PhD [facility] Endocrinology

## 2017-03-14 DIAGNOSIS — D044 Carcinoma in situ of skin of scalp and neck: Secondary | ICD-10-CM | POA: Diagnosis not present

## 2017-03-17 ENCOUNTER — Other Ambulatory Visit: Payer: Self-pay | Admitting: Cardiology

## 2017-03-17 MED ORDER — NITROGLYCERIN 0.2 MG/HR TD PT24: 0.2000 mg | MEDICATED_PATCH | Freq: Every day | TRANSDERMAL | 3 refills | Status: DC

## 2017-03-17 NOTE — Telephone Encounter (Signed)
Rx(s) sent to pharmacy electronically.  

## 2017-03-19 ENCOUNTER — Other Ambulatory Visit: Payer: Self-pay | Admitting: Pulmonary Disease

## 2017-03-20 ENCOUNTER — Encounter: Payer: Self-pay | Admitting: Cardiology

## 2017-03-22 DIAGNOSIS — L603 Nail dystrophy: Secondary | ICD-10-CM | POA: Diagnosis not present

## 2017-03-22 DIAGNOSIS — L97512 Non-pressure chronic ulcer of other part of right foot with fat layer exposed: Secondary | ICD-10-CM | POA: Diagnosis not present

## 2017-03-22 DIAGNOSIS — E1151 Type 2 diabetes mellitus with diabetic peripheral angiopathy without gangrene: Secondary | ICD-10-CM | POA: Diagnosis not present

## 2017-03-22 DIAGNOSIS — L84 Corns and callosities: Secondary | ICD-10-CM | POA: Diagnosis not present

## 2017-03-22 DIAGNOSIS — I739 Peripheral vascular disease, unspecified: Secondary | ICD-10-CM | POA: Diagnosis not present

## 2017-03-28 DIAGNOSIS — E1142 Type 2 diabetes mellitus with diabetic polyneuropathy: Secondary | ICD-10-CM | POA: Diagnosis not present

## 2017-03-28 DIAGNOSIS — R300 Dysuria: Secondary | ICD-10-CM | POA: Diagnosis not present

## 2017-03-28 DIAGNOSIS — N400 Enlarged prostate without lower urinary tract symptoms: Secondary | ICD-10-CM | POA: Diagnosis not present

## 2017-03-28 DIAGNOSIS — I10 Essential (primary) hypertension: Secondary | ICD-10-CM | POA: Diagnosis not present

## 2017-04-05 DIAGNOSIS — L97512 Non-pressure chronic ulcer of other part of right foot with fat layer exposed: Secondary | ICD-10-CM | POA: Diagnosis not present

## 2017-04-12 ENCOUNTER — Other Ambulatory Visit: Payer: Self-pay | Admitting: Internal Medicine

## 2017-04-26 DIAGNOSIS — L97511 Non-pressure chronic ulcer of other part of right foot limited to breakdown of skin: Secondary | ICD-10-CM | POA: Diagnosis not present

## 2017-05-01 ENCOUNTER — Encounter: Payer: Self-pay | Admitting: Neurology

## 2017-05-01 ENCOUNTER — Ambulatory Visit (INDEPENDENT_AMBULATORY_CARE_PROVIDER_SITE_OTHER): Payer: Medicare Other | Admitting: Neurology

## 2017-05-01 VITALS — BP 102/61 | HR 68 | Ht 68.0 in | Wt 176.0 lb

## 2017-05-01 DIAGNOSIS — R413 Other amnesia: Secondary | ICD-10-CM | POA: Diagnosis not present

## 2017-05-01 DIAGNOSIS — I255 Ischemic cardiomyopathy: Secondary | ICD-10-CM | POA: Diagnosis not present

## 2017-05-01 MED ORDER — MEMANTINE HCL 10 MG PO TABS: 10.0000 mg | ORAL_TABLET | Freq: Two times a day (BID) | ORAL | 4 refills | Status: DC

## 2017-05-01 MED ORDER — DONEPEZIL HCL 10 MG PO TABS: 10.0000 mg | ORAL_TABLET | Freq: Every morning | ORAL | 4 refills | Status: DC

## 2017-05-01 NOTE — Progress Notes (Signed)
Chief Complaint  Patient presents with  . Memory Loss    MMSE 28/30 - 8 animals. Last seen 08/2015.  He is here with son, [name]. They feel his memory has been stable. He is currently taking both Aricept and Namenda.       HISTORICAL  Sherard CALIB WADHWA is a 83 years old right-handed male, seen in referred by his primary care physician Dr. Doreene Nest for memory trouble, he is accompanied by his son [name], and daughter [name] at today's clinical visit.  He had a history of hyperlipidemia,diabetes, pacemaker placement, he used to work as a Copy, just retired in 2015, he lived in the same household for 40 years.  [name] his son, noticed patient has gradual onset memory trouble, especially since his retirement in 2015, He has difficulty remembering his password, misplace things, less confident in driving directions. He still able to drive start distance, he has difficulty to carry on complicated conversations, tends to repeat himself, word finding difficulties  I have reviewed most recent note with Dr. Doreene Nest in Dec 12 2014, per record, Aricept was started since April 2016, mild improvement  CAT scan of the brain without contrast April 2016 showed no acute abnormality  Laboratory in April 2016, normal CBC, CMP showed elevated glucose 256, creatinine 1.65, normal TSH 1.15, B12 567   UPDATE Aug 18 2015: He is accompanied by his son [name]. He had pneumonia 6 times since last visit in June 2016, he was diagnosed with aspiration pneumonia, also related to his COPD.  He required intubation on one occasions, he also received endoscopy guided botulism toxin injection for his lower esophagus spasm, he can taste better, swallowing better  He still lives alone, active, still drives. His glucose is elevated.  He use insulin for his DM, he is manage his insulin dose without difficulty, his memory seems to improve up his extensive treatment for pneumonia.  UPDATE May 01 2017: He has gradual onset  memory loss, taking Namenda, Aricept, last clinical visit was in January 2017,  He presented to the hospital February 05 2017 for COPD exacerbation, was treated with prednisone, also had congestive heart failure, ejection fraction 20-25%, was again presented to the hospital July urinary tract infection increase UTI, leukocytosis, is treated with antibiotics,  CT head without contrast February 15 2017 showed generalized atrophy supratentorium small vessel disease no acute abnormality.  Laboratory evaluation in 2018 showed Normal BMP, creatinine 1.56,Elevated WBC 16.1, hemoglobin 11 point 7, urine culture showed more than 100,000 CITROBACTER KOSERI A1c was elevated 6.9  He has increased confusion during UTI on July 18th 2018, now gradually recovered to his baseline, continue has slow worsening memory loss only drive short distance, he go out to airport, grocery shopping, pharmacy, he manage with his medications, his son helps checking.    REVIEW OF SYSTEMS: Full 14 system review of systems performed and notable only for memory loss, shortness of breath, cough, unexpected weight change, runny nose, frequent awakening, difficulty urinating, dizziness  ALLERGIES: Allergies  Allergen Reactions  . Codeine Other (See Comments)    Gets very angry, disoriented  . Dilaudid [Hydromorphone Hcl] Other (See Comments)    VERY AGITATED, HOSTILE  . Flomax [Tamsulosin Hcl] Shortness Of Breath  . Morphine And Related Other (See Comments)    VERY AGITATED, HOSTILE  . Sulfa Antibiotics Shortness Of Breath  . Beta Adrenergic Blockers Other (See Comments)    Disorientation  . Carvedilol Other (See Comments)    DISORIENTATION    HOME  MEDICATIONS: Current Outpatient Prescriptions  Medication Sig Dispense Refill  . aspirin 81 MG EC tablet Take 81 mg by mouth daily.      . budesonide-formoterol (SYMBICORT) 160-4.5 MCG/ACT inhaler Inhale 2 puffs into the lungs 2 (two) times daily. 1 Inhaler 12  . Cholecalciferol  (VITAMIN D PO) Take 2,000 Units by mouth daily.     Marland Kitchen donepezil (ARICEPT) 10 MG tablet Take 10 mg by mouth every morning.    . fenofibrate (TRICOR) 145 MG tablet TAKE 1 TABLET BY MOUTH EVERY DAY 90 tablet 1  . furosemide (LASIX) 20 MG tablet TAKE 1 TABLET BY MOUTH EVERY DAY 90 tablet 2  . guaiFENesin (MUCINEX) 600 MG 12 hr tablet Take 1 tablet (600 mg total) by mouth 2 (two) times daily as needed for to loosen phlegm (for chest congestion). 30 tablet 0  . insulin glargine (LANTUS) 100 UNIT/ML injection Inject 24 Units into the skin every morning.     . nitroGLYCERIN (NITRODUR - DOSED IN MG/24 HR) 0.2 mg/hr patch PLACE 1 PATCH ONTO THE SKIN DAILY 90 patch 4  . pantoprazole (PROTONIX) 20 MG tablet TAKE 1 TABLET BY MOUTH EVERY DAY 90 tablet 0  . pravastatin (PRAVACHOL) 40 MG tablet TAKE 1 TABLET BY MOUTH EVERY EVENING 90 tablet 1  . PROVENTIL HFA 108 (90 BASE) MCG/ACT inhaler Inhale 2 puffs into the lungs every 6 (six) hours as needed for wheezing or shortness of breath. As directed    . tiotropium (SPIRIVA) 18 MCG inhalation capsule Place 1 capsule (18 mcg total) into inhaler and inhale daily. 30 capsule 6     PAST MEDICAL HISTORY: Past Medical History:  Diagnosis Date  . Achalasia   . AICD (automatic cardioverter/defibrillator) present 03/30/2011   Analyze ST study patient  . Anginal pain ([place])   . BPH (benign prostatic hyperplasia)   . CHF (congestive heart failure) ([place])   . Chronic systolic dysfunction of left ventricle    EF 30-35%, CLASS II - III SYMPTOMS; intolerant to Coreg  . CKD (chronic kidney disease) stage 3, GFR 30-59 ml/min ([name]) 06/08/2012  . COPD (chronic obstructive pulmonary disease) ([place])   . Coronary artery disease    History of remote anterior MI with PCI to LAD in 2006; most recent cath 2007, no intervention required  . DOE (dyspnea on exertion)    with heavy exertion  . Dysrhythmia   . Esophageal dysmotility   . GERD (gastroesophageal reflux disease)   . GERD  (gastroesophageal reflux disease)   . Head injury, closed, with concussion 2000ish  . Heart murmur    hx  . HOH (hard of hearing)   . Hyperlipidemia   . Memory loss    improved  . Myocardial infarction ([place]) 1994   ANTERIOR  . Pneumonia "several times"   aspiration pna with at least 3 admits for this 2016.   Marland Kitchen PVC's (premature ventricular contractions)   . Renal failure   . Skin cancer "several"   "forearms; head"  . Type II diabetes mellitus ([place])     PAST SURGICAL HISTORY: Past Surgical History:  Procedure Laterality Date  . BALLOON DILATION N/A 09/09/2015   Procedure: BALLOON DILATION;  Surgeon: Mauri Pole, MD;  Location: [facility];  Service: Endoscopy;  Laterality: N/A;  rigiflex achalasia balloon dilators  . BOTOX INJECTION N/A 04/08/2015   Procedure: BOTOX INJECTION;  Surgeon: Milus Banister, MD;  Location: [facility];  Service: Endoscopy;  Laterality: N/A;  . CARDIAC CATHETERIZATION  01/11/2006  DEMONSTRATES AKINESIA OF THE DISTAL ANTERIOR WALL, DISTAL INFERIOR WALL AND AKINESIA OF THE APEX. THE BASAL SEGMENTS CONTRACT WELL AND OVERALL EF 35%  . CATARACT EXTRACTION W/ INTRAOCULAR LENS  IMPLANT, BILATERAL  08/2014-09/2014  . CHOLECYSTECTOMY  2/11  . CORONARY ANGIOPLASTY  1994   TO THE LAD  . ESOPHAGEAL MANOMETRY N/A 03/16/2015   Procedure: ESOPHAGEAL MANOMETRY (EM);  Surgeon: Jerene Bears, MD;  Location: [facility];  Service: Gastroenterology;  Laterality: N/A;  . ESOPHAGOGASTRODUODENOSCOPY N/A 04/08/2015   Procedure: ESOPHAGOGASTRODUODENOSCOPY (EGD);  Surgeon: Milus Banister, MD;  Location: [facility];  Service: Endoscopy;  Laterality: N/A;  . ESOPHAGOGASTRODUODENOSCOPY (EGD) WITH PROPOFOL N/A 09/09/2015   Procedure: ESOPHAGOGASTRODUODENOSCOPY (EGD) WITH PROPOFOL;  Surgeon: Mauri Pole, MD;  Location: [facility];  Service: Endoscopy;  Laterality: N/A;  pt. is to have gastrografin xray post recovery-do not discharge until results are back  . EYE SURGERY      . FOOT FRACTURE SURGERY Right 1980's  . INSERT / REPLACE / REMOVE PACEMAKER    . SKIN CANCER EXCISION  "several"   "forearms, head"    FAMILY HISTORY: Family History  Problem Relation Age of Onset  . Stroke Father        Mother history unknown - never met her  . Colon cancer Neg Hx     SOCIAL HISTORY:  Social History   Social History  . Marital status: Widowed    Spouse name: N/A  . Number of children: 2  . Years of education: GED   Occupational History  . 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 Smoking status: Former Smoker    Packs/day: 1.00    Years: 35.00    Types: Cigarettes    Quit date: 08/01/1992  . Smokeless tobacco: Former Systems developer    Quit date: 1994  . Alcohol use No  . Drug use: No  . Sexual activity: Not Currently   Other Topics Concern  . Not on file   Social History Narrative   Pt lives in [place] alone.  Widowed.   Right-handed.   Rare caffeine use - maybe one cup per month.     PHYSICAL EXAM   Vitals:   05/01/17 1455  BP: 102/61  Pulse: 68  Weight: 176 lb (79.8 kg)  Height: _0  (1.727 m)    Not recorded      Body mass index is 26.76 kg/m.  PHYSICAL EXAMNIATION:  Gen: NAD, conversant, well nourised, obese, well groomed                     Cardiovascular: Regular rate rhythm, no peripheral edema, warm, nontender. Eyes: Conjunctivae clear without exudates or hemorrhage Neck: Supple, no carotid bruise. Pulmonary: Clear to auscultation bilaterally   NEUROLOGICAL EXAM:  MMSE - Mini Mental State Exam 05/01/2017 08/18/2015 01/19/2015  Orientation to time _1 Orientation to Place _2 Registration _3 Attention/ Calculation _4 Recall _5 Language- name 2 objects _6 Language- repeat _7 Language- follow 3 step command _8 Language- read & follow direction _9 Write a sentence _10 Copy design 1 1 0  Total score _11 animal naming 8   CRANIAL NERVES: CN II: Visual fields are full  to confrontation. Fundoscopic exam is normal with sharp discs and no vascular changes. Venous pulsations are present bilaterally. Pupils are 4 mm and briskly reactive to light. Visual acuity is 20/20 bilaterally. CN III, IV, VI: extraocular movement are normal. No ptosis. CN V: Facial sensation is intact to pinprick in all 3 divisions bilaterally. Corneal responses are intact.  CN VII: Face is symmetric with normal eye closure and smile. CN VIII: Hearing is normal to rubbing fingers CN IX, X: Palate elevates symmetrically. Phonation is normal. CN XI: Head turning and shoulder shrug are intact CN XII: Tongue is midline with normal movements and no atrophy.  MOTOR: There is no pronator drift of out-stretched arms. Muscle bulk and tone are normal. Muscle strength is normal.  REFLEXES: Reflexes are 2+ and symmetric at the biceps, triceps, knees, and ankles. Plantar responses are flexor.  SENSORY: Mild length dependent decreased light touch pinprick  COORDINATION: Rapid alternating movements and fine finger movements are intact. There is no dysmetria on finger-to-nose and heel-knee-shin. There are no abnormal or extraneous movements.   GAIT/STANCE: Steady gait   DIAGNOSTIC DATA (LABS, IMAGING, TESTING) - I reviewed patient records, labs, notes, testing and imaging myself where available.  Lab Results  Component Value Date   WBC 16.1 (H) 02/17/2017   HGB 11.7 (L) 02/17/2017   HCT 38.6 (L) 02/17/2017   MCV 89.1 02/17/2017   PLT 204 02/17/2017      Component Value Date/Time   NA 138 02/17/2017 0346   K 3.9 02/17/2017 0346   CL 107 02/17/2017 0346   CO2 25 02/17/2017 0346   GLUCOSE 163 (H) 02/17/2017 0346   BUN 20 02/17/2017 0346   CREATININE 1.55 (H) 02/17/2017 0346   CREATININE 1.48 (H) 04/24/2015 1227   CALCIUM 8.1 (L) 02/17/2017 0346   PROT 6.8 02/15/2017 1738   ALBUMIN 3.0 (L) 02/15/2017 1738   AST 27  02/15/2017 1738   ALT 23 02/15/2017 1738   ALKPHOS 52 02/15/2017 1738   BILITOT 0.8 02/15/2017 1738   GFRNONAA 40 (L) 02/17/2017 0346   GFRAA 47 (L) 02/17/2017 0346   Lab Results  Component Value Date   CHOL 136 06/20/2013   HDL 32.00 (L) 06/20/2013   LDLCALC 76 06/20/2013   TRIG 142.0 06/20/2013   CHOLHDL 4 06/20/2013   Lab Results  Component Value Date    ASSESSMENT AND PLAN  Lorain LION FERNANDEZ is a 83 y.o. male with history of gradual onset memory trouble, Mini-Mental Status Examination is 28 out of 30  Mild cognitive impairment  Keep Namenda 10 mg twice a day, Aricept 10 mg daily refilled his medications  Return to clinic for new issues  Marcial Pacas, M.D. Ph.D.  [facility] [address] [place], [place] [address] Ph: [phone] Fax: [phone]

## 2017-05-17 ENCOUNTER — Ambulatory Visit (INDEPENDENT_AMBULATORY_CARE_PROVIDER_SITE_OTHER): Payer: Medicare Other | Admitting: Pulmonary Disease

## 2017-05-17 ENCOUNTER — Encounter: Payer: Self-pay | Admitting: Pulmonary Disease

## 2017-05-17 ENCOUNTER — Ambulatory Visit (INDEPENDENT_AMBULATORY_CARE_PROVIDER_SITE_OTHER)
Admission: RE | Admit: 2017-05-17 | Discharge: 2017-05-17 | Disposition: A | Discharge status: Home / Self Care | Payer: Medicare Other | Source: Ambulatory Visit | Attending: Pulmonary Disease | Admitting: Pulmonary Disease

## 2017-05-17 VITALS — BP 116/62 | HR 71 | Ht 68.0 in | Wt 175.0 lb

## 2017-05-17 DIAGNOSIS — R0600 Dyspnea, unspecified: Secondary | ICD-10-CM

## 2017-05-17 DIAGNOSIS — R05 Cough: Secondary | ICD-10-CM | POA: Diagnosis not present

## 2017-05-17 DIAGNOSIS — J449 Chronic obstructive pulmonary disease, unspecified: Secondary | ICD-10-CM

## 2017-05-17 DIAGNOSIS — R059 Cough, unspecified: Secondary | ICD-10-CM

## 2017-05-17 DIAGNOSIS — I255 Ischemic cardiomyopathy: Secondary | ICD-10-CM | POA: Diagnosis not present

## 2017-05-17 DIAGNOSIS — Z23 Encounter for immunization: Secondary | ICD-10-CM

## 2017-05-17 NOTE — Patient Instructions (Addendum)
COPD: Continue taking Symbicort and Spiriva as you are doing Flu shot today Stay active  For the increasing cough with mucus production: We'll check a chest x-ray today Let me know if this worsens in the next 2-3 days and I will be sure to call in an antibiotic and prednisone.  Follow up in 4-6 months or sooner if needed
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
## 2017-05-19 DIAGNOSIS — R3911 Hesitancy of micturition: Secondary | ICD-10-CM | POA: Diagnosis not present

## 2017-05-19 DIAGNOSIS — N401 Enlarged prostate with lower urinary tract symptoms: Secondary | ICD-10-CM | POA: Diagnosis not present

## 2017-05-24 NOTE — Progress Notes (Signed)
Electrophysiology Office Note Date: 05/26/2017  ID:  Antonio Ray, DOB [birth year], MRN [id number]  PCP: [name], MD Primary Cardiologist: [place] Electrophysiologist:Allred  CC: Routine ICD follow-up  Antonio Ray is a 83 y.o. male seen today for Dr Rayann Heman.  He presents today for routine electrophysiology followup.  Since last being seen in our clinic, the patient reports doing reasonably well. He denies chest pain, palpitations, dyspnea, PND, orthopnea, nausea, vomiting, dizziness, syncope, edema, weight gain, or early satiety.  He has not had ICD shocks.   Device History: STJ dual chamber ICD implanted 2012 for ICM History of appropriate therapy: No History of AAD therapy: No   Past Medical History:  Diagnosis Date  . Achalasia   . AICD (automatic cardioverter/defibrillator) present 03/30/2011   Analyze ST study patient  . Anginal pain ([place])   . BPH (benign prostatic hyperplasia)   . CHF (congestive heart failure) ([place])   . Chronic systolic dysfunction of left ventricle    EF 30-35%, CLASS II - III SYMPTOMS; intolerant to Coreg  . CKD (chronic kidney disease) stage 3, GFR 30-59 ml/min ([name]) 06/08/2012  . COPD (chronic obstructive pulmonary disease) ([place])   . Coronary artery disease    History of remote anterior MI with PCI to LAD in 2006; most recent cath 2007, no intervention required  . DOE (dyspnea on exertion)    with heavy exertion  . Dysrhythmia   . Esophageal dysmotility   . GERD (gastroesophageal reflux disease)   . GERD (gastroesophageal reflux disease)   . Head injury, closed, with concussion 2000ish  . Heart murmur    hx  . HOH (hard of hearing)   . Hyperlipidemia   . Memory loss    improved  . Myocardial infarction ([place]) 1994   ANTERIOR  . Pneumonia "several times"   aspiration pna with at least 3 admits for this 2016.   Marland Kitchen PVC's (premature ventricular contractions)   . Renal failure   . Skin cancer "several"   "forearms; head"  . Type  II diabetes mellitus ([place])    Past Surgical History:  Procedure Laterality Date  . BALLOON DILATION N/A 09/09/2015   Procedure: BALLOON DILATION;  Surgeon: Mauri Pole, MD;  Location: [facility];  Service: Endoscopy;  Laterality: N/A;  rigiflex achalasia balloon dilators  . BOTOX INJECTION N/A 04/08/2015   Procedure: BOTOX INJECTION;  Surgeon: Milus Banister, MD;  Location: [facility];  Service: Endoscopy;  Laterality: N/A;  . CARDIAC CATHETERIZATION  01/11/2006   DEMONSTRATES AKINESIA OF THE DISTAL ANTERIOR WALL, DISTAL INFERIOR WALL AND AKINESIA OF THE APEX. THE BASAL SEGMENTS CONTRACT WELL AND OVERALL EF 35%  . CATARACT EXTRACTION W/ INTRAOCULAR LENS  IMPLANT, BILATERAL  08/2014-09/2014  . CHOLECYSTECTOMY  2/11  . CORONARY ANGIOPLASTY  1994   TO THE LAD  . ESOPHAGEAL MANOMETRY N/A 03/16/2015   Procedure: ESOPHAGEAL MANOMETRY (EM);  Surgeon: Jerene Bears, MD;  Location: [facility];  Service: Gastroenterology;  Laterality: N/A;  . ESOPHAGOGASTRODUODENOSCOPY N/A 04/08/2015   Procedure: ESOPHAGOGASTRODUODENOSCOPY (EGD);  Surgeon: Milus Banister, MD;  Location: [facility];  Service: Endoscopy;  Laterality: N/A;  . ESOPHAGOGASTRODUODENOSCOPY (EGD) WITH PROPOFOL N/A 09/09/2015   Procedure: ESOPHAGOGASTRODUODENOSCOPY (EGD) WITH PROPOFOL;  Surgeon: Mauri Pole, MD;  Location: [facility];  Service: Endoscopy;  Laterality: N/A;  pt. is to have gastrografin xray post recovery-do not discharge until results are back  . EYE SURGERY    . FOOT FRACTURE SURGERY Right 1980's  .  INSERT / REPLACE / REMOVE PACEMAKER    . SKIN CANCER EXCISION  "several"   "forearms, head"    Current Outpatient Prescriptions  Medication Sig Dispense Refill  . albuterol (PROVENTIL HFA;VENTOLIN HFA) 108 (90 Base) MCG/ACT inhaler Inhale 2 puffs into the lungs every 6 (six) hours as needed for wheezing or shortness of breath. 1 Inhaler 2  . aspirin 81 MG EC tablet Take 81 mg by mouth daily.      .  Cholecalciferol (VITAMIN D) 2000 units tablet Take 2,000 Units by mouth daily.     Marland Kitchen donepezil (ARICEPT) 10 MG tablet Take 1 tablet (10 mg total) by mouth every morning. 90 tablet 4  . fenofibrate (TRICOR) 145 MG tablet TAKE 1 TABLET BY MOUTH EVERY DAY (Patient taking differently: Take 145 mg by mouth once a day) 90 tablet 3  . finasteride (PROSCAR) 5 MG tablet Take 5 mg by mouth daily.  0  . furosemide (LASIX) 20 MG tablet Take 2 tablets (40 mg total) by mouth daily. 180 tablet 3  . insulin aspart (NOVOLOG FLEXPEN) 100 UNIT/ML FlexPen Inject 5-9 Units into the skin See admin instructions. 5 pen 11  . Insulin Glargine (BASAGLAR KWIKPEN) 100 UNIT/ML SOPN INJECT 20 UNITS UNDER THE SKIN EVERY MORNING 15 mL 0  . loratadine (CLARITIN) 10 MG tablet Take 1 tablet (10 mg total) by mouth daily. 30 tablet 1  . memantine (NAMENDA) 10 MG tablet Take 1 tablet (10 mg total) by mouth 2 (two) times daily. 180 tablet 4  . Multiple Vitamins-Minerals (PRESERVISION AREDS 2) CAPS Take 1 capsule by mouth 2 (two) times daily.    . nitroGLYCERIN (NITRODUR - DOSED IN MG/24 HR) 0.2 mg/hr patch Place 1 patch (0.2 mg total) onto the skin daily. Alternates patch placement with odd/even days. 30 patch 3  . pantoprazole (PROTONIX) 20 MG tablet TAKE 1 TABLET BY MOUTH DAILY 90 tablet 0  . pravastatin (PRAVACHOL) 40 MG tablet Take 1 tablet (40 mg total) by mouth daily. (Patient taking differently: Take 40 mg by mouth at bedtime. ) 90 tablet 1  . SPIRIVA HANDIHALER 18 MCG inhalation capsule INHALE CONTENTS OF 1 CAPSULE VIA HANDIHALER ONCE DAILY 30 capsule 2  . SYMBICORT 160-4.5 MCG/ACT inhaler INHALE 2 PUFFS BY MOUTH TWICE DAILY 10.2 g 5   No current facility-administered medications for this visit.     Allergies:   Codeine; Dilaudid [hydromorphone hcl]; Flomax [tamsulosin hcl]; Morphine and related; Sulfa antibiotics; Beta adrenergic blockers; and Carvedilol   Social History: Social History   Social History  . Marital  status: Widowed    Spouse name: N/A  . Number of children: 2  . Years of education: GED   Occupational History  . 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 Smoking status: Former Smoker    Packs/day: 1.00    Years: 35.00    Types: Cigarettes    Quit date: 08/01/1992  . Smokeless tobacco: Former Systems developer    Quit date: 1994  . Alcohol use No  . Drug use: No  . Sexual activity: Not Currently   Other Topics Concern  . Not on file   Social History Narrative   Pt lives in [place] alone.  Widowed.   Right-handed.   Rare caffeine use - maybe one cup per month.    Family History: Family History  Problem Relation Age of Onset  . Stroke  Father        Mother history unknown - never met her  . Colon cancer Neg Hx     Review of Systems: All other systems reviewed and are otherwise negative except as noted above.   Physical Exam: VS:  BP 110/60   Pulse (!) 116   Ht 5' 8"  (1.727 m)   Wt 175 lb 9.6 oz (79.7 kg)   SpO2 98%   BMI 26.70 kg/m  , BMI Body mass index is 26.7 kg/m.  GEN- The patient is elderly appearing, alert and oriented x 3 today.   HEENT: normocephalic, atraumatic; sclera clear, conjunctiva pink; hearing intact; oropharynx clear; neck supple Lungs- Clear to ausculation bilaterally, normal work of breathing.  No wheezes, rales, rhonchi Heart- Regular rate and rhythm GI- soft, non-tender, non-distended, bowel sounds present Extremities- no clubbing, cyanosis, or edema MS- no significant deformity or atrophy Skin- warm and dry, no rash or lesion; ICD pocket well healed Psych- euthymic mood, full affect Neuro- strength and sensation are intact  ICD interrogation- reviewed in detail today,  See PACEART report  EKG:  EKG is not ordered today.  Recent Labs: 02/02/2017: B Natriuretic Peptide 108.5 02/15/2017: ALT 23 02/17/2017: BUN 20; Creatinine, Ser 1.55; Hemoglobin 11.7; Platelets 204;  Potassium 3.9; Sodium 138   Wt Readings from Last 3 Encounters:  05/26/17 175 lb 9.6 oz (79.7 kg)  05/17/17 175 lb (79.4 kg)  05/01/17 176 lb (79.8 kg)     Other studies Reviewed: Additional studies/ records that were reviewed today include: Dr Martinique and Dr Jackalyn Lombard office notes  Assessment and Plan:  1.  Chronic systolic dysfunction euvolemic today Stable on an appropriate medical regimen Normal ICD function See Pace Art report No changes today  2.  CAD No recent ischemic symptoms Continue current therapy   Current medicines are reviewed at length with the patient today.   The patient does not have concerns regarding his medicines.  The following changes were made today:  none  Labs/ tests ordered today include: none No orders of the defined types were placed in this encounter.    Disposition:   Follow up with Delilah Shan, Dr Rayann Heman 1 year      Signed, Chanetta Marshall, NP 05/26/2017 9:45 AM  [facility] [address] [place] [address] [phone] (office) [phone] (fax)

## 2017-05-26 ENCOUNTER — Ambulatory Visit (INDEPENDENT_AMBULATORY_CARE_PROVIDER_SITE_OTHER): Payer: Medicare Other | Admitting: Nurse Practitioner

## 2017-05-26 ENCOUNTER — Encounter: Payer: Self-pay | Admitting: Nurse Practitioner

## 2017-05-26 ENCOUNTER — Other Ambulatory Visit: Payer: Self-pay

## 2017-05-26 ENCOUNTER — Other Ambulatory Visit: Payer: Self-pay | Admitting: Gastroenterology

## 2017-05-26 VITALS — BP 110/60 | HR 116 | Ht 68.0 in | Wt 175.6 lb

## 2017-05-26 DIAGNOSIS — I255 Ischemic cardiomyopathy: Secondary | ICD-10-CM | POA: Diagnosis not present

## 2017-05-26 DIAGNOSIS — I5022 Chronic systolic (congestive) heart failure: Secondary | ICD-10-CM | POA: Diagnosis not present

## 2017-05-26 DIAGNOSIS — I2589 Other forms of chronic ischemic heart disease: Secondary | ICD-10-CM

## 2017-05-26 LAB — CUP PACEART INCLINIC DEVICE CHECK
Date Time Interrogation Session: 20181026094707
Implantable Lead Implant Date: 20120829
Implantable Lead Location: 753860
MDC IDC LEAD IMPLANT DT: 20120829
MDC IDC LEAD LOCATION: 753859
MDC IDC PG IMPLANT DT: 20120829
Pulse Gen Serial Number: 828604

## 2017-05-26 MED ORDER — PRAVASTATIN SODIUM 40 MG PO TABS: 40.0000 mg | ORAL_TABLET | Freq: Every day | ORAL | 3 refills | Status: DC

## 2017-05-26 NOTE — Patient Instructions (Addendum)
Medication Instructions:   Your physician recommends that you continue on your current medications as directed. Please refer to the Current Medication list given to you today.   If you need a refill on your cardiac medications before your next appointment, please call your pharmacy.  Labwork: NONE ORDERED  TODAY    Testing/Procedures: NONE ORDERED  TODAY    Follow-Up:  Your physician wants you to follow-up in: [place] will receive a reminder letter in the mail two months in advance. If you don't receive a letter, please call our office to schedule the follow-up appointment.  Remote monitoring is used to monitor your Pacemaker of ICD from home. This monitoring reduces the number of office visits required to check your device to one time per year. It allows [place] to keep an eye on the functioning of your device to ensure it is working properly. You are scheduled for a device check from home on.Marland Kitchen1-28-19..You may send your transmission at any time that day. If you have a wireless device, the transmission will be sent automatically. After your physician reviews your transmission, you will receive a postcard with your next transmission date.     Any Other Special Instructions Will Be Listed Below (If Applicable).

## 2017-05-29 DIAGNOSIS — I10 Essential (primary) hypertension: Secondary | ICD-10-CM | POA: Diagnosis not present

## 2017-05-29 DIAGNOSIS — Z0000 Encounter for general adult medical examination without abnormal findings: Secondary | ICD-10-CM | POA: Diagnosis not present

## 2017-05-29 DIAGNOSIS — F0390 Unspecified dementia without behavioral disturbance: Secondary | ICD-10-CM | POA: Diagnosis not present

## 2017-05-29 DIAGNOSIS — I495 Sick sinus syndrome: Secondary | ICD-10-CM | POA: Diagnosis not present

## 2017-05-29 DIAGNOSIS — I5022 Chronic systolic (congestive) heart failure: Secondary | ICD-10-CM | POA: Diagnosis not present

## 2017-05-29 DIAGNOSIS — J449 Chronic obstructive pulmonary disease, unspecified: Secondary | ICD-10-CM | POA: Diagnosis not present

## 2017-06-01 ENCOUNTER — Ambulatory Visit (INDEPENDENT_AMBULATORY_CARE_PROVIDER_SITE_OTHER): Payer: Medicare Other | Admitting: Internal Medicine

## 2017-06-01 ENCOUNTER — Encounter: Payer: Self-pay | Admitting: Internal Medicine

## 2017-06-01 VITALS — BP 128/72 | HR 76 | Wt 173.0 lb

## 2017-06-01 DIAGNOSIS — Z794 Long term (current) use of insulin: Secondary | ICD-10-CM | POA: Diagnosis not present

## 2017-06-01 DIAGNOSIS — N183 Chronic kidney disease, stage 3 unspecified: Secondary | ICD-10-CM

## 2017-06-01 DIAGNOSIS — E1122 Type 2 diabetes mellitus with diabetic chronic kidney disease: Secondary | ICD-10-CM | POA: Diagnosis not present

## 2017-06-01 DIAGNOSIS — E785 Hyperlipidemia, unspecified: Secondary | ICD-10-CM | POA: Diagnosis not present

## 2017-06-01 DIAGNOSIS — I255 Ischemic cardiomyopathy: Secondary | ICD-10-CM | POA: Diagnosis not present

## 2017-06-01 LAB — POCT GLYCOSYLATED HEMOGLOBIN (HGB A1C): HEMOGLOBIN A1C: 6.1

## 2017-06-01 MED ORDER — BASAGLAR KWIKPEN 100 UNIT/ML ~~LOC~~ SOPN: PEN_INJECTOR | SUBCUTANEOUS | 11 refills | Status: DC

## 2017-06-01 MED ORDER — INSULIN ASPART 100 UNIT/ML FLEXPEN: 5.0000 [IU] | PEN_INJECTOR | SUBCUTANEOUS | 11 refills | Status: DC

## 2017-06-01 NOTE — Patient Instructions (Addendum)
Please decrease: - Basaglar to 16 units in am.  Continue: - Novolog (10-15 min before a meal) 5-6 units before meals  Please return in 3-4 months with your sugar log.

## 2017-06-01 NOTE — Addendum Note (Signed)
Addended by: Caprice Beaver T on: 06/01/2017 10:38 AM   Modules accepted: Orders

## 2017-06-01 NOTE — Progress Notes (Signed)
Patient ID: Antonio Ray, male   DOB: 12/09/1933, 83 y.o.   MRN: [id number]   HPI: Antonio Ray is a 83 y.o.-year-old male, returning for f/u for DM2, dx in 2010, insulin-dependent since ~2012, uncontrolled, with complications (PN, CKD stage 3-4, CAD, CHF - ICD). He is here with his son who offers most of the history, especially regarding blood sugar checks and insulin dosing. Last visit 3 mo ago.  He is having smaller meals 2/2 achalasia >> backed off NovoLog and Basaglar doses.  Last hemoglobin A1c was: Lab Results  Component Value Date   HGBA1C 6.9 03/01/2017   HGBA1C 7.9 (H) 11/11/2016   HGBA1C 9.9 07/19/2016   He had a lot of steroids for COPD. He had a severe PNA episode in 2016 2/2 aspiration 2/2 achalasia. He had several admissions >> on repeated Solumedrol doses. Since then >> occasional COPD exacerbations >> Solumedrol + prednisone tapers.  He is on: - Basaglar 20 units in am >> 18 units - Novolog (10-15 min before a meal): - 7-8 units for b'fast >> 5-6 units - 5-7 units for lunch and dinner >> 5-6 units He adds 1-2 units to Novolog to the mealtime insulin if on steroids.  Pt checks his sugars 3x a day: - am: 109-152 >> 93-148, 151, 217 >> 76-124, 138 - 2h after b'fast: n/c >> 151-246 >> n/c - before lunch: 96-162, 187 >> 112, 133-173, 190, 226 >> 87-154, 177 - 2h after lunch: n/c >> 93, 106-180, 233 >> n/c - before dinner: 90-178 >> 92, 97, 115-178, 229, 259 (prednisone) >> 68-152, 166, 223, 253 - 2h after dinner: n/c >> 107-176, 211, 240 >> n/c - bedtime: n/c >> 114-187 >> n/c - nighttime: n/c Lowest sugar was 99 >> 97 >> 92 >> 69 Highest sugar was HI >> 287 >> 240  >> 259 >> 253  Glucometer: Freestyle Lite  Pt's meals are: - Breakfast: bacon, eggs, hash browns - Lunch: sandwich or soup - Dinner: varies, meat + veggies - Snacks: 1-2  He walks daily.  - + CKD, last BUN/creatinine:  Lab Results  Component Value Date   BUN 20 02/17/2017   BUN 29 (H)  02/16/2017   CREATININE 1.55 (H) 02/17/2017   CREATININE 1.75 (H) 02/16/2017   - + HL; last set of lipids: 02/05/2016: 144/223/40/59 Lab Results  Component Value Date   CHOL 136 06/20/2013   HDL 32.00 (L) 06/20/2013   LDLCALC 76 06/20/2013   TRIG 142.0 06/20/2013   CHOLHDL 4 06/20/2013  On Pravastatin - last eye exam was in 03/2016 >> ? DR. Dr. Marica Otter. Had cataract Sx in 2016. + macular degeneration. Has a foot ulcer 11/2014 >> healed well (2016). He has DM shoes. R foot more swollen after his fx >> 20 years ago. He Is seen by a podiatrist. -  No  numbness and tingling in his feet.  ROS: Constitutional: + weight loss, no fatigue, no subjective hyperthermia, no subjective hypothermia Eyes: no blurry vision, no xerophthalmia ENT: no sore throat, no nodules palpated in throat, no dysphagia, no odynophagia, no hoarseness Cardiovascular: no CP/no SOB/no palpitations/no leg swelling Respiratory: no cough/no SOB/no wheezing Gastrointestinal: no N/no V/no D/no C/no acid reflux Musculoskeletal: no muscle aches/no joint aches Skin: no rashes, no hair loss Neurological: no tremors/no numbness/no tingling/no dizziness  I reviewed pt's medications, allergies, PMH, social hx, family hx, and changes were documented in the history of present illness. Otherwise, unchanged from my initial visit note.  Past Medical  History:  Diagnosis Date  . Achalasia   . AICD (automatic cardioverter/defibrillator) present 03/30/2011   Analyze ST study patient  . Anginal pain ([place])   . BPH (benign prostatic hyperplasia)   . CHF (congestive heart failure) ([place])   . Chronic systolic dysfunction of left ventricle    EF 30-35%, CLASS II - III SYMPTOMS; intolerant to Coreg  . CKD (chronic kidney disease) stage 3, GFR 30-59 ml/min ([name]) 06/08/2012  . COPD (chronic obstructive pulmonary disease) ([place])   . Coronary artery disease    History of remote anterior MI with PCI to LAD in 2006; most recent cath 2007,  no intervention required  . DOE (dyspnea on exertion)    with heavy exertion  . Dysrhythmia   . Esophageal dysmotility   . GERD (gastroesophageal reflux disease)   . GERD (gastroesophageal reflux disease)   . Head injury, closed, with concussion 2000ish  . Heart murmur    hx  . HOH (hard of hearing)   . Hyperlipidemia   . Memory loss    improved  . Myocardial infarction ([place]) 1994   ANTERIOR  . Pneumonia "several times"   aspiration pna with at least 3 admits for this 2016.   Marland Kitchen PVC's (premature ventricular contractions)   . Renal failure   . Skin cancer "several"   "forearms; head"  . Type II diabetes mellitus ([place])    Past Surgical History:  Procedure Laterality Date  . BALLOON DILATION N/A 09/09/2015   Procedure: BALLOON DILATION;  Surgeon: Mauri Pole, MD;  Location: [facility];  Service: Endoscopy;  Laterality: N/A;  rigiflex achalasia balloon dilators  . BOTOX INJECTION N/A 04/08/2015   Procedure: BOTOX INJECTION;  Surgeon: Milus Banister, MD;  Location: [facility];  Service: Endoscopy;  Laterality: N/A;  . CARDIAC CATHETERIZATION  01/11/2006   DEMONSTRATES AKINESIA OF THE DISTAL ANTERIOR WALL, DISTAL INFERIOR WALL AND AKINESIA OF THE APEX. THE BASAL SEGMENTS CONTRACT WELL AND OVERALL EF 35%  . CATARACT EXTRACTION W/ INTRAOCULAR LENS  IMPLANT, BILATERAL  08/2014-09/2014  . CHOLECYSTECTOMY  2/11  . CORONARY ANGIOPLASTY  1994   TO THE LAD  . ESOPHAGEAL MANOMETRY N/A 03/16/2015   Procedure: ESOPHAGEAL MANOMETRY (EM);  Surgeon: Jerene Bears, MD;  Location: [facility];  Service: Gastroenterology;  Laterality: N/A;  . ESOPHAGOGASTRODUODENOSCOPY N/A 04/08/2015   Procedure: ESOPHAGOGASTRODUODENOSCOPY (EGD);  Surgeon: Milus Banister, MD;  Location: [facility];  Service: Endoscopy;  Laterality: N/A;  . ESOPHAGOGASTRODUODENOSCOPY (EGD) WITH PROPOFOL N/A 09/09/2015   Procedure: ESOPHAGOGASTRODUODENOSCOPY (EGD) WITH PROPOFOL;  Surgeon: Mauri Pole, MD;  Location: [facility];  Service: Endoscopy;  Laterality: N/A;  pt. is to have gastrografin xray post recovery-do not discharge until results are back  . EYE SURGERY    . FOOT FRACTURE SURGERY Right 1980's  . INSERT / REPLACE / REMOVE PACEMAKER    . SKIN CANCER EXCISION  "several"   "forearms, head"   Social History   Social History  . Marital status: Widowed    Spouse name: N/A  . Number of children: 2  . Years of education: GED   Occupational History  . 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 Smoking status: Former Smoker    Packs/day: 1.00    Years: 35.00    Types: Cigarettes    Quit date: 08/01/1992  . Smokeless tobacco: Former Leisure centre manager  date: 101  . Alcohol use No  . Drug use: No  . Sexual activity: Not Currently   Other Topics Concern  . Not on file   Social History Narrative   Pt lives in [place] alone.  Widowed.   Right-handed.   Rare caffeine use - maybe one cup per month.   Current Outpatient Prescriptions on File Prior to Visit  Medication Sig Dispense Refill  . albuterol (PROVENTIL HFA;VENTOLIN HFA) 108 (90 Base) MCG/ACT inhaler Inhale 2 puffs into the lungs every 6 (six) hours as needed for wheezing or shortness of breath. 1 Inhaler 2  . aspirin 81 MG EC tablet Take 81 mg by mouth daily.      . Cholecalciferol (VITAMIN D) 2000 units tablet Take 2,000 Units by mouth daily.     Marland Kitchen donepezil (ARICEPT) 10 MG tablet Take 1 tablet (10 mg total) by mouth every morning. 90 tablet 4  . fenofibrate (TRICOR) 145 MG tablet TAKE 1 TABLET BY MOUTH EVERY DAY (Patient taking differently: Take 145 mg by mouth once a day) 90 tablet 3  . finasteride (PROSCAR) 5 MG tablet Take 5 mg by mouth daily.  0  . furosemide (LASIX) 20 MG tablet Take 2 tablets (40 mg total) by mouth daily. 180 tablet 3  . insulin aspart (NOVOLOG FLEXPEN) 100 UNIT/ML FlexPen Inject 5-9 Units into the skin See admin instructions. 5 pen 11   . Insulin Glargine (BASAGLAR KWIKPEN) 100 UNIT/ML SOPN INJECT 20 UNITS UNDER THE SKIN EVERY MORNING 15 mL 0  . loratadine (CLARITIN) 10 MG tablet Take 1 tablet (10 mg total) by mouth daily. 30 tablet 1  . memantine (NAMENDA) 10 MG tablet Take 1 tablet (10 mg total) by mouth 2 (two) times daily. 180 tablet 4  . Multiple Vitamins-Minerals (PRESERVISION AREDS 2) CAPS Take 1 capsule by mouth 2 (two) times daily.    . nitroGLYCERIN (NITRODUR - DOSED IN MG/24 HR) 0.2 mg/hr patch Place 1 patch (0.2 mg total) onto the skin daily. Alternates patch placement with odd/even days. 30 patch 3  . pantoprazole (PROTONIX) 20 MG tablet TAKE 1 TABLET BY MOUTH DAILY 90 tablet 0  . pravastatin (PRAVACHOL) 40 MG tablet Take 1 tablet (40 mg total) by mouth at bedtime. 90 tablet 3  . SPIRIVA HANDIHALER 18 MCG inhalation capsule INHALE CONTENTS OF 1 CAPSULE VIA HANDIHALER ONCE DAILY 30 capsule 2  . SYMBICORT 160-4.5 MCG/ACT inhaler INHALE 2 PUFFS BY MOUTH TWICE DAILY 10.2 g 5   No current facility-administered medications on file prior to visit.    Allergies  Allergen Reactions  . Codeine Other (See Comments)    Gets very angry, disoriented  . Dilaudid [Hydromorphone Hcl] Other (See Comments)    VERY AGITATED, HOSTILE  . Flomax [Tamsulosin Hcl] Shortness Of Breath  . Morphine And Related Other (See Comments)    VERY AGITATED, HOSTILE  . Sulfa Antibiotics Shortness Of Breath  . Beta Adrenergic Blockers Other (See Comments)    Disorientation  . Carvedilol Other (See Comments)    DISORIENTATION   Family History  Problem Relation Age of Onset  . Stroke Father        Mother history unknown - never met her  . Colon cancer Neg Hx     PE: BP 128/72 (BP Location: Left Arm, Patient Position: Sitting)   Pulse 76   Wt 173 lb (78.5 kg)   SpO2 92%   BMI 26.30 kg/m  Wt Readings from Last 3 Encounters:  06/01/17 173 lb (78.5  kg)  05/26/17 175 lb 9.6 oz (79.7 kg)  05/17/17 175 lb (79.4 kg)   Constitutional:  overweight, in NAD Eyes: PERRLA, EOMI, no exophthalmos ENT: moist mucous membranes, no thyromegaly, no cervical lymphadenopathy Cardiovascular: RRR, No MRG Respiratory: CTA B Gastrointestinal: abdomen soft, NT, ND, BS+ Musculoskeletal: no deformities, strength intact in all 4 Skin: moist, warm, no rashes Neurological: no tremor with outstretched hands, DTR normal in all 4  ASSESSMENT: 1. DM2, insulin-dependent, uncontrolled, with complications - PN - stable - CKD stage 3-4  - CAD, CHF - ICD  2. HL  PLAN:  1. Patient with long-standing, uncontrolled diabetes, with improvement of control on his basal-bolus insulin regimen, so that he could decrease his insulin doses since last visit. He and his son mentioned that he decreased his portions since last visit. - Reviewing his chart, his sugars are mostly at goal  and he even has some mild low-dose. Therefore, we can decrease his Basaglar dose even more, since his lows are usually before meals.  - I suggested to:  Patient Instructions  Please decrease: - Basaglar to 16 units in am.  Continue: - Novolog (10-15 min before a meal) 5-6 units before meals  Please return in 3-4 months with your sugar log.    - today, HbA1c is 6.1% (better!) - continue checking sugars at different times of the day - check 3x a day, rotating checks - advised for yearly eye exams >> he is UTD - UTD with flu shot - Return to clinic in 3-4 mo with sugar log   2. HL -  he continues on Lipitor - no SEs - On last available panel from 2017, LDL was at goal, but triglycerides were high. - We will ask for the latest lipid panel from PCP    Antonio Kingdom, MD PhD [facility] Endocrinology

## 2017-06-08 ENCOUNTER — Other Ambulatory Visit: Payer: Self-pay | Admitting: Pulmonary Disease

## 2017-06-08 DIAGNOSIS — D485 Neoplasm of uncertain behavior of skin: Secondary | ICD-10-CM | POA: Diagnosis not present

## 2017-06-08 DIAGNOSIS — Z85828 Personal history of other malignant neoplasm of skin: Secondary | ICD-10-CM | POA: Diagnosis not present

## 2017-06-08 DIAGNOSIS — L84 Corns and callosities: Secondary | ICD-10-CM | POA: Diagnosis not present

## 2017-06-08 DIAGNOSIS — I739 Peripheral vascular disease, unspecified: Secondary | ICD-10-CM | POA: Diagnosis not present

## 2017-06-08 DIAGNOSIS — Z23 Encounter for immunization: Secondary | ICD-10-CM | POA: Diagnosis not present

## 2017-06-08 DIAGNOSIS — L821 Other seborrheic keratosis: Secondary | ICD-10-CM | POA: Diagnosis not present

## 2017-06-08 DIAGNOSIS — C44229 Squamous cell carcinoma of skin of left ear and external auricular canal: Secondary | ICD-10-CM | POA: Diagnosis not present

## 2017-06-08 DIAGNOSIS — D225 Melanocytic nevi of trunk: Secondary | ICD-10-CM | POA: Diagnosis not present

## 2017-06-08 DIAGNOSIS — E1151 Type 2 diabetes mellitus with diabetic peripheral angiopathy without gangrene: Secondary | ICD-10-CM | POA: Diagnosis not present

## 2017-06-08 DIAGNOSIS — L603 Nail dystrophy: Secondary | ICD-10-CM | POA: Diagnosis not present

## 2017-06-12 ENCOUNTER — Inpatient Hospital Stay (HOSPITAL_COMMUNITY)
Admission: EM | Admit: 2017-06-12 | Discharge: 2017-06-14 | DRG: 871 | Disposition: A | Discharge status: Home Health | Payer: Medicare Other | Attending: Internal Medicine | Admitting: Internal Medicine

## 2017-06-12 ENCOUNTER — Emergency Department (HOSPITAL_COMMUNITY): Payer: Medicare Other

## 2017-06-12 ENCOUNTER — Other Ambulatory Visit: Payer: Self-pay

## 2017-06-12 ENCOUNTER — Telehealth: Payer: Self-pay | Admitting: Pulmonary Disease

## 2017-06-12 ENCOUNTER — Encounter (HOSPITAL_COMMUNITY): Payer: Self-pay | Admitting: *Deleted

## 2017-06-12 DIAGNOSIS — J189 Pneumonia, unspecified organism: Secondary | ICD-10-CM | POA: Diagnosis present

## 2017-06-12 DIAGNOSIS — R402441 Other coma, without documented Glasgow coma scale score, or with partial score reported, in the field [EMT or ambulance]: Secondary | ICD-10-CM | POA: Diagnosis not present

## 2017-06-12 DIAGNOSIS — R509 Fever, unspecified: Secondary | ICD-10-CM | POA: Diagnosis not present

## 2017-06-12 DIAGNOSIS — N183 Chronic kidney disease, stage 3 (moderate): Secondary | ICD-10-CM | POA: Diagnosis present

## 2017-06-12 DIAGNOSIS — J449 Chronic obstructive pulmonary disease, unspecified: Secondary | ICD-10-CM | POA: Diagnosis not present

## 2017-06-12 DIAGNOSIS — Z87891 Personal history of nicotine dependence: Secondary | ICD-10-CM | POA: Diagnosis not present

## 2017-06-12 DIAGNOSIS — E1122 Type 2 diabetes mellitus with diabetic chronic kidney disease: Secondary | ICD-10-CM | POA: Diagnosis present

## 2017-06-12 DIAGNOSIS — I252 Old myocardial infarction: Secondary | ICD-10-CM

## 2017-06-12 DIAGNOSIS — Z794 Long term (current) use of insulin: Secondary | ICD-10-CM

## 2017-06-12 DIAGNOSIS — H9190 Unspecified hearing loss, unspecified ear: Secondary | ICD-10-CM | POA: Diagnosis present

## 2017-06-12 DIAGNOSIS — R4182 Altered mental status, unspecified: Secondary | ICD-10-CM | POA: Diagnosis not present

## 2017-06-12 DIAGNOSIS — K219 Gastro-esophageal reflux disease without esophagitis: Secondary | ICD-10-CM | POA: Diagnosis present

## 2017-06-12 DIAGNOSIS — Z79899 Other long term (current) drug therapy: Secondary | ICD-10-CM

## 2017-06-12 DIAGNOSIS — R05 Cough: Secondary | ICD-10-CM | POA: Diagnosis not present

## 2017-06-12 DIAGNOSIS — Z7982 Long term (current) use of aspirin: Secondary | ICD-10-CM

## 2017-06-12 DIAGNOSIS — I5042 Chronic combined systolic (congestive) and diastolic (congestive) heart failure: Secondary | ICD-10-CM | POA: Diagnosis not present

## 2017-06-12 DIAGNOSIS — N179 Acute kidney failure, unspecified: Secondary | ICD-10-CM | POA: Diagnosis not present

## 2017-06-12 DIAGNOSIS — Z85828 Personal history of other malignant neoplasm of skin: Secondary | ICD-10-CM

## 2017-06-12 DIAGNOSIS — F0390 Unspecified dementia without behavioral disturbance: Secondary | ICD-10-CM | POA: Diagnosis present

## 2017-06-12 DIAGNOSIS — A419 Sepsis, unspecified organism: Principal | ICD-10-CM | POA: Diagnosis present

## 2017-06-12 DIAGNOSIS — E785 Hyperlipidemia, unspecified: Secondary | ICD-10-CM | POA: Diagnosis present

## 2017-06-12 DIAGNOSIS — Z95810 Presence of automatic (implantable) cardiac defibrillator: Secondary | ICD-10-CM

## 2017-06-12 DIAGNOSIS — N400 Enlarged prostate without lower urinary tract symptoms: Secondary | ICD-10-CM | POA: Diagnosis present

## 2017-06-12 DIAGNOSIS — I2510 Atherosclerotic heart disease of native coronary artery without angina pectoris: Secondary | ICD-10-CM | POA: Diagnosis present

## 2017-06-12 LAB — COMPREHENSIVE METABOLIC PANEL
ALT: 24 U/L (ref 17–63)
ANION GAP: 9 (ref 5–15)
AST: 37 U/L (ref 15–41)
Albumin: 2.9 g/dL — ABNORMAL LOW (ref 3.5–5.0)
Alkaline Phosphatase: 44 U/L (ref 38–126)
BUN: 37 mg/dL — AB (ref 6–20)
CHLORIDE: 104 mmol/L (ref 101–111)
CO2: 26 mmol/L (ref 22–32)
Calcium: 8.8 mg/dL — ABNORMAL LOW (ref 8.9–10.3)
Creatinine, Ser: 2.03 mg/dL — ABNORMAL HIGH (ref 0.61–1.24)
GFR, EST AFRICAN AMERICAN: 34 mL/min — AB (ref >60)
GFR, EST NON AFRICAN AMERICAN: 29 mL/min — AB (ref >60)
Glucose, Bld: 132 mg/dL — ABNORMAL HIGH (ref 65–99)
POTASSIUM: 4.1 mmol/L (ref 3.5–5.1)
Sodium: 139 mmol/L (ref 135–145)
TOTAL PROTEIN: 6.5 g/dL (ref 6.5–8.1)
Total Bilirubin: 0.8 mg/dL (ref 0.3–1.2)

## 2017-06-12 LAB — STREP PNEUMONIAE URINARY ANTIGEN: Strep Pneumo Urinary Antigen: NEGATIVE

## 2017-06-12 LAB — CBG MONITORING, ED: Glucose-Capillary: 134 mg/dL — ABNORMAL HIGH (ref 65–99)

## 2017-06-12 LAB — PROTIME-INR
INR: 1.24
Prothrombin Time: 15.5 seconds — ABNORMAL HIGH (ref 11.4–15.2)

## 2017-06-12 LAB — CBC WITH DIFFERENTIAL/PLATELET
BASOS ABS: 0 10*3/uL (ref 0.0–0.1)
BASOS PCT: 0 %
Eosinophils Absolute: 0.1 10*3/uL (ref 0.0–0.7)
Eosinophils Relative: 1 %
HEMATOCRIT: 41.9 % (ref 39.0–52.0)
HEMOGLOBIN: 13 g/dL (ref 13.0–17.0)
LYMPHS PCT: 6 %
Lymphs Abs: 0.8 10*3/uL (ref 0.7–4.0)
MCH: 27.4 pg (ref 26.0–34.0)
MCHC: 31 g/dL (ref 30.0–36.0)
MCV: 88.4 fL (ref 78.0–100.0)
Monocytes Absolute: 0.8 10*3/uL (ref 0.1–1.0)
Monocytes Relative: 6 %
NEUTROS ABS: 11.9 10*3/uL — AB (ref 1.7–7.7)
NEUTROS PCT: 87 %
Platelets: 247 10*3/uL (ref 150–400)
RBC: 4.74 MIL/uL (ref 4.22–5.81)
RDW: 16.5 % — ABNORMAL HIGH (ref 11.5–15.5)
WBC: 13.5 10*3/uL — AB (ref 4.0–10.5)

## 2017-06-12 LAB — URINALYSIS, ROUTINE W REFLEX MICROSCOPIC
BILIRUBIN URINE: NEGATIVE
Glucose, UA: NEGATIVE mg/dL
HGB URINE DIPSTICK: NEGATIVE
Ketones, ur: NEGATIVE mg/dL
Leukocytes, UA: NEGATIVE
NITRITE: NEGATIVE
PROTEIN: NEGATIVE mg/dL
SPECIFIC GRAVITY, URINE: 1.009 (ref 1.005–1.030)
pH: 7 (ref 5.0–8.0)

## 2017-06-12 LAB — PROCALCITONIN: PROCALCITONIN: 0.12 ng/mL

## 2017-06-12 LAB — I-STAT CG4 LACTIC ACID, ED: LACTIC ACID, VENOUS: 1.51 mmol/L (ref 0.5–1.9)

## 2017-06-12 MED ORDER — PRAVASTATIN SODIUM 40 MG PO TABS
40.0000 mg | ORAL_TABLET | Freq: Every day | ORAL | Status: DC
  Administered 2017-06-12 – 2017-06-13 (×2): 40 mg via ORAL
  Filled 2017-06-12 (×2): qty 1

## 2017-06-12 MED ORDER — ACETAMINOPHEN 325 MG PO TABS: 650.0000 mg | ORAL_TABLET | Freq: Four times a day (QID) | ORAL | Status: DC | PRN

## 2017-06-12 MED ORDER — ACETAMINOPHEN 650 MG RE SUPP: 650.0000 mg | Freq: Four times a day (QID) | RECTAL | Status: DC | PRN

## 2017-06-12 MED ORDER — ASPIRIN EC 81 MG PO TBEC
81.0000 mg | DELAYED_RELEASE_TABLET | Freq: Every day | ORAL | Status: DC
  Administered 2017-06-13 – 2017-06-14 (×2): 81 mg via ORAL
  Filled 2017-06-12 (×2): qty 1

## 2017-06-12 MED ORDER — TIOTROPIUM BROMIDE MONOHYDRATE 18 MCG IN CAPS
18.0000 ug | ORAL_CAPSULE | Freq: Every day | RESPIRATORY_TRACT | Status: DC
  Administered 2017-06-13 – 2017-06-14 (×2): 18 ug via RESPIRATORY_TRACT
  Filled 2017-06-12: qty 5

## 2017-06-12 MED ORDER — SODIUM CHLORIDE 0.9 % IV SOLN
INTRAVENOUS | Status: DC
  Administered 2017-06-12: 18:00:00 via INTRAVENOUS

## 2017-06-12 MED ORDER — ORAL CARE MOUTH RINSE
15.0000 mL | Freq: Two times a day (BID) | OROMUCOSAL | Status: DC
  Administered 2017-06-13 – 2017-06-14 (×3): 15 mL via OROMUCOSAL

## 2017-06-12 MED ORDER — SODIUM CHLORIDE 0.9% FLUSH
3.0000 mL | Freq: Two times a day (BID) | INTRAVENOUS | Status: DC
  Administered 2017-06-12 – 2017-06-14 (×4): 3 mL via INTRAVENOUS

## 2017-06-12 MED ORDER — SODIUM CHLORIDE 0.9 % IV BOLUS (SEPSIS)
1000.0000 mL | Freq: Once | INTRAVENOUS | Status: AC
  Administered 2017-06-12: 1000 mL via INTRAVENOUS

## 2017-06-12 MED ORDER — LORATADINE 10 MG PO TABS
10.0000 mg | ORAL_TABLET | Freq: Every day | ORAL | Status: DC
  Administered 2017-06-13 – 2017-06-14 (×2): 10 mg via ORAL
  Filled 2017-06-12 (×2): qty 1

## 2017-06-12 MED ORDER — SODIUM CHLORIDE 0.9 % IV SOLN: INTRAVENOUS | Status: DC

## 2017-06-12 MED ORDER — ALBUTEROL SULFATE (2.5 MG/3ML) 0.083% IN NEBU: 3.0000 mL | INHALATION_SOLUTION | Freq: Four times a day (QID) | RESPIRATORY_TRACT | Status: DC | PRN

## 2017-06-12 MED ORDER — INSULIN ASPART 100 UNIT/ML ~~LOC~~ SOLN
0.0000 [IU] | SUBCUTANEOUS | Status: DC
  Administered 2017-06-12: 2 [IU] via SUBCUTANEOUS

## 2017-06-12 MED ORDER — SENNOSIDES-DOCUSATE SODIUM 8.6-50 MG PO TABS: 1.0000 | ORAL_TABLET | Freq: Every evening | ORAL | Status: DC | PRN

## 2017-06-12 MED ORDER — HEPARIN SODIUM (PORCINE) 5000 UNIT/ML IJ SOLN
5000.0000 [IU] | Freq: Three times a day (TID) | INTRAMUSCULAR | Status: DC
  Administered 2017-06-12 – 2017-06-14 (×6): 5000 [IU] via SUBCUTANEOUS
  Filled 2017-06-12 (×6): qty 1

## 2017-06-12 MED ORDER — INSULIN GLARGINE 100 UNIT/ML ~~LOC~~ SOLN
16.0000 [IU] | Freq: Every day | SUBCUTANEOUS | Status: DC
  Administered 2017-06-13 – 2017-06-14 (×2): 16 [IU] via SUBCUTANEOUS
  Filled 2017-06-12 (×3): qty 0.16

## 2017-06-12 MED ORDER — SODIUM CHLORIDE 0.9 % IV SOLN: INTRAVENOUS | Status: AC

## 2017-06-12 MED ORDER — DEXTROSE 5 % IV SOLN
500.0000 mg | Freq: Once | INTRAVENOUS | Status: AC
  Administered 2017-06-12: 500 mg via INTRAVENOUS
  Filled 2017-06-12: qty 500

## 2017-06-12 MED ORDER — FUROSEMIDE 40 MG PO TABS
40.0000 mg | ORAL_TABLET | Freq: Every day | ORAL | Status: DC
Start: 2017-06-13 — End: 2017-06-14
  Administered 2017-06-13 – 2017-06-14 (×2): 40 mg via ORAL
  Filled 2017-06-12 (×2): qty 1

## 2017-06-12 MED ORDER — PROMETHAZINE HCL 25 MG PO TABS: 12.5000 mg | ORAL_TABLET | Freq: Four times a day (QID) | ORAL | Status: DC | PRN

## 2017-06-12 MED ORDER — DEXTROSE 5 % IV SOLN
1.0000 g | Freq: Once | INTRAVENOUS | Status: AC
  Administered 2017-06-12: 1 g via INTRAVENOUS
  Filled 2017-06-12: qty 10

## 2017-06-12 MED ORDER — FLUTICASONE FUROATE-VILANTEROL 200-25 MCG/INH IN AEPB
1.0000 | INHALATION_SPRAY | Freq: Every day | RESPIRATORY_TRACT | Status: DC
  Administered 2017-06-13 – 2017-06-14 (×2): 1 via RESPIRATORY_TRACT
  Filled 2017-06-12: qty 28

## 2017-06-12 MED ORDER — MEMANTINE HCL 10 MG PO TABS
10.0000 mg | ORAL_TABLET | Freq: Two times a day (BID) | ORAL | Status: DC
  Administered 2017-06-12 – 2017-06-14 (×4): 10 mg via ORAL
  Filled 2017-06-12 (×4): qty 1

## 2017-06-12 MED ORDER — ACETAMINOPHEN 500 MG PO TABS: 1000.0000 mg | ORAL_TABLET | Freq: Once | ORAL | Status: DC

## 2017-06-12 MED ORDER — DONEPEZIL HCL 10 MG PO TABS
10.0000 mg | ORAL_TABLET | Freq: Every morning | ORAL | Status: DC
Start: 2017-06-13 — End: 2017-06-14
  Administered 2017-06-13 – 2017-06-14 (×2): 10 mg via ORAL
  Filled 2017-06-12 (×2): qty 1

## 2017-06-12 NOTE — Progress Notes (Addendum)
NURSING PROGRESS NOTE  Antonio Daubert CagleMRN: [id number] Admission Data: 06/12/17 at 2040 Attending Provider: Aldine Contes, MD PCP: [name], MD Code status: Full  Allergies:  Allergies  Allergen Reactions  . Codeine Other (See Comments)    Gets very angry, disoriented  . Dilaudid [Hydromorphone Hcl] Other (See Comments)    VERY AGITATED, HOSTILE  . Flomax [Tamsulosin Hcl] Shortness Of Breath  . Morphine And Related Other (See Comments)    VERY AGITATED, HOSTILE  . Sulfa Antibiotics Shortness Of Breath  . Beta Adrenergic Blockers Other (See Comments)    Disorientation  . Carvedilol Other (See Comments)    DISORIENTATION   Past Medical History:  Past Medical History:  Diagnosis Date  . Achalasia   . AICD (automatic cardioverter/defibrillator) present 03/30/2011   Analyze ST study patient  . Anginal pain ([place])   . BPH (benign prostatic hyperplasia)   . CHF (congestive heart failure) ([place])   . Chronic systolic dysfunction of left ventricle    EF 30-35%, CLASS II - III SYMPTOMS; intolerant to Coreg  . CKD (chronic kidney disease) stage 3, GFR 30-59 ml/min ([name]) 06/08/2012  . COPD (chronic obstructive pulmonary disease) ([place])   . Coronary artery disease    History of remote anterior MI with PCI to LAD in 2006; most recent cath 2007, no intervention required  . DOE (dyspnea on exertion)    with heavy exertion  . Dysrhythmia   . Esophageal dysmotility   . GERD (gastroesophageal reflux disease)   . GERD (gastroesophageal reflux disease)   . Head injury, closed, with concussion 2000ish  . Heart murmur    hx  . HOH (hard of hearing)   . Hyperlipidemia   . Memory loss    improved  . Myocardial infarction ([place]) 1994   ANTERIOR  . Pneumonia "several times"   aspiration pna with at least 3 admits for this 2016.   Marland Kitchen PVC's (premature ventricular contractions)   . Renal failure   . Skin cancer "several"   "forearms; head"  . Type II diabetes mellitus ([place])     Past Surgical History:  Past Surgical History:  Procedure Laterality Date  . CARDIAC CATHETERIZATION  01/11/2006   DEMONSTRATES AKINESIA OF THE DISTAL ANTERIOR WALL, DISTAL INFERIOR WALL AND AKINESIA OF THE APEX. THE BASAL SEGMENTS CONTRACT WELL AND OVERALL EF 35%  . CATARACT EXTRACTION W/ INTRAOCULAR LENS  IMPLANT, BILATERAL  08/2014-09/2014  . CHOLECYSTECTOMY  2/11  . CORONARY ANGIOPLASTY  1994   TO THE LAD  . EYE SURGERY    . FOOT FRACTURE SURGERY Right 1980's  . INSERT / REPLACE / REMOVE PACEMAKER    . SKIN CANCER EXCISION  "several"   "forearms, head"  Antonio Ray is a 83 y.o. male patient, arrived to floor in room [phone] via stretcher, transferred from ED. Patient alert and oriented X 4. No acute distress noted. Denies pain. Patient placed on low bed for safety.  Vital signs: Oral temperature 98.1 F (36.7 C), Blood pressure 99/50, Pulse 68, RR 22, SpO2 90 % on room air. Height 5'8", weight 168.8 lbs (76.5 kg).   Cardiac monitoring: Telemetry box 5W # 31 in place. Second verified by Antonio Ray  IV access: LAC and RFA; condition patent and no redness.  Skin: intact, no pressure ulcer noted in sacral area. Abrasion with scab noted on left auricle of ear; bruising noted on arms bilaterally; smooth calus with discoloration underneath it on medial upper plantar surface (bottom of Right foot)-no open  areas.  Patient's ID armband verified with patient/ family, and in place. Information packet given to patient/ family. Fall risk assessed, SR up X2, patient/ family able to verbalize understanding of risks associated with falls and to call nurse or staff to assist before getting out of bed. Patient/ family oriented to room and equipment. Call bell within reach.

## 2017-06-12 NOTE — ED Notes (Signed)
Patient transported to X-ray 

## 2017-06-12 NOTE — ED Triage Notes (Signed)
Pt here from home.  Pt had some confusion that began this am.  Pt is alert and oriented at this time to self, situation.  Pt can't tell me the year or who is president.  Pt has hx of aspiration pneumonia.  Pt was 92-93% on RA and EMS placed him on 6L [place].  Pt was left on RA to evaluate RA spo2 on arrival. Pt is 91% on RA here, pt son placed him back on 2L [place].

## 2017-06-12 NOTE — ED Provider Notes (Signed)
[facility] EMERGENCY DEPARTMENT Provider Note   CSN: [id number] Arrival date & time: 06/12/17  1216     History   Chief Complaint Chief Complaint  Patient presents with  . Fever    HPI Antonio Ray is a 83 y.o. male.  83 yo M with a chief complaint of cough and fever.  Patient also has had a significant change of his mental status this morning.  There that he is much more confused than normal.  This is happened to him in the past when he had an infection.  I suspect is likely pneumonia because he has been coughing and there was a questionable aspiration event a few days ago.  The patient denies chest pain shortness of breath abdominal pain nausea vomiting or diarrhea.  Denies dysuria flank pain.  Denies wounds.   The history is provided by the patient, a relative and the EMS personnel.  Fever   This is a new problem. The current episode started 3 to 5 hours ago. The problem occurs constantly. The problem has not changed since onset.The maximum temperature noted was 101 to 101.9 F. The temperature was taken using an oral thermometer. Associated symptoms include cough. Pertinent negatives include no chest pain, no diarrhea, no vomiting, no congestion and no headaches. He has tried nothing for the symptoms. The treatment provided no relief.    Past Medical History:  Diagnosis Date  . Achalasia   . AICD (automatic cardioverter/defibrillator) present 03/30/2011   Analyze ST study patient  . Anginal pain ([place])   . BPH (benign prostatic hyperplasia)   . CHF (congestive heart failure) ([place])   . Chronic systolic dysfunction of left ventricle    EF 30-35%, CLASS II - III SYMPTOMS; intolerant to Coreg  . CKD (chronic kidney disease) stage 3, GFR 30-59 ml/min ([name]) 06/08/2012  . COPD (chronic obstructive pulmonary disease) ([place])   . Coronary artery disease    History of remote anterior MI with PCI to LAD in 2006; most recent cath 2007, no intervention required  .  DOE (dyspnea on exertion)    with heavy exertion  . Dysrhythmia   . Esophageal dysmotility   . GERD (gastroesophageal reflux disease)   . GERD (gastroesophageal reflux disease)   . Head injury, closed, with concussion 2000ish  . Heart murmur    hx  . HOH (hard of hearing)   . Hyperlipidemia   . Memory loss    improved  . Myocardial infarction ([place]) 1994   ANTERIOR  . Pneumonia "several times"   aspiration pna with at least 3 admits for this 2016.   Marland Kitchen PVC's (premature ventricular contractions)   . Renal failure   . Skin cancer "several"   "forearms; head"  . Type II diabetes mellitus [facility])     Patient Active Problem List   Diagnosis Date Noted  . Pneumonia 06/12/2017  . Acute UTI   . Altered mental state 02/15/2017  . Achalasia of esophagus   . Encephalopathy   . Fever chills   . SOB (shortness of breath)   . Acute respiratory failure with hypoxemia ([place])   . Acute respiratory failure ([place]) 04/21/2016  . Chronic combined systolic and diastolic congestive heart failure ([place]) 04/21/2016  . Memory loss 04/21/2016  . Allergic rhinitis 04/11/2016  . Chronic systolic CHF (congestive heart failure) ([place]) 10/30/2015  . Mild cognitive impairment 08/18/2015  . Difficulty in swallowing   . AKI (acute kidney injury) ([place])   . Type 2 diabetes  mellitus with stage 3 chronic kidney disease, with long-term current use of insulin ([place])   . Chronic systolic congestive heart failure ([place])   . Chronic kidney disease, stage III (moderate) ([name])   . Other specified hypotension   . CKD (chronic kidney disease), stage III ([place])   . Other emphysema ([place])   . Systolic CHF, acute on chronic ([name])   . AICD (automatic cardioverter/defibrillator) present   . CAD S/P percutaneous coronary angioplasty   . Acute respiratory failure with hypoxia ([place]) 04/03/2015  . Cardiomyopathy, ischemic 04/03/2015  . Elevated troponin 04/03/2015  . Sepsis ([place]) 03/31/2015  . COPD exacerbation ([place])   .  Aspiration into airway 03/21/2015  . Achalasia 03/21/2015  . Dysphagia   . Esophageal dysphagia   . Esophageal dysmotilities   . COPD with acute exacerbation ([place]) 01/14/2015  . Left ventricular apical thrombus 01/14/2015  . Acute on chronic systolic CHF (congestive heart failure) ([place]) 01/14/2015  . ARF (acute renal failure) ([place]) 01/14/2015  . Dysphagia, unspecified(787.20) 10/30/2013  . COPD GOLD IV 10/07/2013  . Leukocytosis 09/11/2013  . Acute encephalopathy 09/10/2013  . Renal failure (ARF), acute on chronic ([name]) 09/10/2013  . Automatic implantable cardioverter-defibrillator in situ 07/06/2012  . CKD (chronic kidney disease) stage 3, GFR 30-59 ml/min ([name]) 06/08/2012  . GERD (gastroesophageal reflux disease) 09/26/2011  . BPH (benign prostatic hyperplasia) 08/22/2011  . COPD (chronic obstructive pulmonary disease) ([place]) 07/17/2011  . S/P ICD (internal cardiac defibrillator) procedure 04/20/2011  . Coronary artery disease   . Sick sinus syndrome ([place])   . Acute on chronic combined systolic and diastolic congestive heart failure, NYHA class 3 ([place])   . Hyperlipidemia   . PVC's (premature ventricular contractions)     Past Surgical History:  Procedure Laterality Date  . CARDIAC CATHETERIZATION  01/11/2006   DEMONSTRATES AKINESIA OF THE DISTAL ANTERIOR WALL, DISTAL INFERIOR WALL AND AKINESIA OF THE APEX. THE BASAL SEGMENTS CONTRACT WELL AND OVERALL EF 35%  . CATARACT EXTRACTION W/ INTRAOCULAR LENS  IMPLANT, BILATERAL  08/2014-09/2014  . CHOLECYSTECTOMY  2/11  . CORONARY ANGIOPLASTY  1994   TO THE LAD  . EYE SURGERY    . FOOT FRACTURE SURGERY Right 1980's  . INSERT / REPLACE / REMOVE PACEMAKER    . SKIN CANCER EXCISION  "several"   "forearms, head"       Home Medications    Prior to Admission medications   Medication Sig Start Date End Date Taking? Authorizing Provider  albuterol (PROVENTIL HFA;VENTOLIN HFA) 108 (90 Base) MCG/ACT inhaler Inhale 2 puffs into the lungs  every 6 (six) hours as needed for wheezing or shortness of breath. 04/22/16  Yes Eugenie Filler, MD  aspirin 81 MG EC tablet Take 81 mg by mouth daily.     Yes [provider]  Cholecalciferol (VITAMIN D) 2000 units tablet Take 2,000 Units by mouth daily.    Yes [provider]  donepezil (ARICEPT) 10 MG tablet Take 1 tablet (10 mg total) by mouth every morning. 05/01/17  Yes Marcial Pacas, MD  fenofibrate (TRICOR) 145 MG tablet TAKE 1 TABLET BY MOUTH EVERY DAY Patient taking differently: Take 145 mg by mouth once a day 02/10/17  Yes [place], Peter M, MD  finasteride (PROSCAR) 5 MG tablet Take 5 mg by mouth daily. 05/19/17  Yes [provider]  furosemide (LASIX) 20 MG tablet Take 2 tablets (40 mg total) by mouth daily. 11/22/16  Yes [place], Peter M, MD  insulin aspart (NOVOLOG FLEXPEN) 100 UNIT/ML  FlexPen Inject 5-6 Units into the skin See admin instructions. Patient taking differently: Inject 5-7 Units 3 (three) times daily with meals into the skin.  06/01/17  Yes Philemon Kingdom, MD  Insulin Glargine (BASAGLAR KWIKPEN) 100 UNIT/ML SOPN INJECT 16 UNITS UNDER THE SKIN EVERY MORNING 06/01/17  Yes Philemon Kingdom, MD  loratadine (CLARITIN) 10 MG tablet Take 1 tablet (10 mg total) by mouth daily. 07/02/15  Yes Barton Dubois, MD  memantine (NAMENDA) 10 MG tablet Take 1 tablet (10 mg total) by mouth 2 (two) times daily. 05/01/17  Yes Marcial Pacas, MD  Multiple Vitamins-Minerals (PRESERVISION AREDS 2) CAPS Take 1 capsule by mouth 2 (two) times daily.   Yes [provider]  nitroGLYCERIN (NITRODUR - DOSED IN MG/24 HR) 0.2 mg/hr patch Place 1 patch (0.2 mg total) onto the skin daily. Alternates patch placement with odd/even days. 03/17/17  Yes [place], Peter M, MD  pantoprazole (PROTONIX) 20 MG tablet TAKE 1 TABLET BY MOUTH DAILY 05/26/17  Yes Nandigam, Venia Minks, MD  pravastatin (PRAVACHOL) 40 MG tablet Take 1 tablet (40 mg total) by mouth at bedtime. 05/26/17  Yes [place],  Peter M, MD  SPIRIVA HANDIHALER 18 MCG inhalation capsule INHALE CONTENTS OF 1 CAPSULE VIA HANDIHALER ONCE DAILY 06/08/17  Yes Juanito Doom, MD  SYMBICORT 160-4.5 MCG/ACT inhaler INHALE 2 PUFFS BY MOUTH TWICE DAILY 12/12/16  Yes Juanito Doom, MD    Family History Family History  Problem Relation Age of Onset  . Stroke Father        Mother history unknown - never met her  . Colon cancer Neg Hx     Social History Social History   Tobacco Use  . Smoking status: Former Smoker    Packs/day: 1.00    Years: 35.00    Pack years: 35.00    Types: Cigarettes    Last attempt to quit: 08/01/1992    Years since quitting: 24.8  . Smokeless tobacco: Former Systems developer    Quit date: 1994  Substance Use Topics  . Alcohol use: No    Alcohol/week: 0.0 oz  . Drug use: No     Allergies   Codeine; Dilaudid [hydromorphone hcl]; Flomax [tamsulosin hcl]; Morphine and related; Sulfa antibiotics; Beta adrenergic blockers; and Carvedilol   Review of Systems Review of Systems  Constitutional: Positive for activity change and fever. Negative for chills.  HENT: Negative for congestion and facial swelling.   Eyes: Negative for discharge and visual disturbance.  Respiratory: Positive for cough. Negative for shortness of breath.   Cardiovascular: Negative for chest pain and palpitations.  Gastrointestinal: Negative for abdominal pain, diarrhea and vomiting.  Musculoskeletal: Negative for arthralgias and myalgias.  Skin: Negative for color change and rash.  Neurological: Negative for tremors, syncope and headaches.  Psychiatric/Behavioral: Negative for confusion and dysphoric mood.     Physical Exam Updated Vital Signs BP (!) 91/43   Pulse 62   Temp 98.8 F (37.1 C) (Oral)   Resp 17   Wt 75.4 kg (166 lb 3.6 oz)   SpO2 94%   BMI 25.27 kg/m   Physical Exam  Constitutional: He appears well-developed and well-nourished.  HENT:  Head: Normocephalic and atraumatic.  Eyes: EOM are normal.  Pupils are equal, round, and reactive to light.  Neck: Normal range of motion. Neck supple. No JVD present.  Cardiovascular: Normal rate and regular rhythm. Exam reveals no gallop and no friction rub.  No murmur heard. Pulmonary/Chest: No respiratory distress. He has no wheezes.  Abdominal:  He exhibits no distension and no mass. There is no tenderness. There is no rebound and no guarding.  Musculoskeletal: Normal range of motion.  Neurological: He is alert.  Confused to situation  Skin: No rash noted. No pallor.  Psychiatric: He has a normal mood and affect. His behavior is normal.  Nursing note and vitals reviewed.    ED Treatments / Results  Labs (all labs ordered are listed, but only abnormal results are displayed) Labs Reviewed  COMPREHENSIVE METABOLIC PANEL - Abnormal; Notable for the following components:      Result Value   Glucose, Bld 132 (*)    BUN 37 (*)    Creatinine, Ser 2.03 (*)    Calcium 8.8 (*)    Albumin 2.9 (*)    GFR calc non Af Amer 29 (*)    GFR calc Af Amer 34 (*)    All other components within normal limits  CBC WITH DIFFERENTIAL/PLATELET - Abnormal; Notable for the following components:   WBC 13.5 (*)    RDW 16.5 (*)    Neutro Abs 11.9 (*)    All other components within normal limits  PROTIME-INR - Abnormal; Notable for the following components:   Prothrombin Time 15.5 (*)    All other components within normal limits  CULTURE, BLOOD (ROUTINE X 2)  CULTURE, BLOOD (ROUTINE X 2)  RESPIRATORY PANEL BY PCR  URINALYSIS, ROUTINE W REFLEX MICROSCOPIC  PROCALCITONIN  STREP PNEUMONIAE URINARY ANTIGEN  LEGIONELLA PNEUMOPHILA SEROGP 1 UR AG  I-STAT CG4 LACTIC ACID, ED    EKG  EKG Interpretation None       Radiology Dg Chest 2 View  Result Date: 06/12/2017 CLINICAL DATA:  83 year old male with fever this morning. Chronic nonproductive cough. EXAM: CHEST  2 VIEW COMPARISON:  05/17/2017 and earlier. FINDINGS: Semi upright AP and lateral views of the  chest. Chronic left chest cardiac AICD. Calcified aortic atherosclerosis. Other mediastinal contours are within normal limits. Visualized tracheal air column is within normal limits. Large lung volumes. No pneumothorax, pulmonary edema, pleural effusion or consolidation. However, bilateral pulmonary interstitial markings appear increased. No acute osseous abnormality identified. Negative visible bowel gas pattern. IMPRESSION: 1. Large lung volumes with increased pulmonary interstitial markings compared to October. Consider acute viral/atypical respiratory infection. 2. No other acute cardiopulmonary abnormality. 3.  Aortic Atherosclerosis (ICD10-I70.0). Electronically Signed   By: Genevie Ann M.D.   On: 06/12/2017 13:51    Procedures Procedures (including critical care time)  Medications Ordered in ED Medications  acetaminophen (TYLENOL) tablet 1,000 mg (1,000 mg Oral Not Given 06/12/17 1314)  0.9 %  sodium chloride infusion (not administered)  sodium chloride 0.9 % bolus 1,000 mL (0 mLs Intravenous Stopped 06/12/17 1411)  cefTRIAXone (ROCEPHIN) 1 g in dextrose 5 % 50 mL IVPB (0 g Intravenous Stopped 06/12/17 1449)  azithromycin (ZITHROMAX) 500 mg in dextrose 5 % 250 mL IVPB (0 mg Intravenous Stopped 06/12/17 1609)     Initial Impression / Assessment and Plan / ED Course  I have reviewed the triage vital signs and the nursing notes.  Pertinent labs & imaging results that were available during my care of the patient were reviewed by me and considered in my medical decision making (see chart for details).     83 yo M with a chief complaint of fever and altered mental status.  Will obtain labs chest x-ray UA give IV fluids and Tylenol and reassess.  The the patient on reassessment is feeling much better.  Mental status back  to baseline.  Chest x-ray with an atypical pneumonia picture.  No recent hospitalization we will start on Rocephin and azithromycin.  Admit.  The patients results and plan  were reviewed and discussed.   Any x-rays performed were independently reviewed by myself.   Differential diagnosis were considered with the presenting HPI.  Medications  acetaminophen (TYLENOL) tablet 1,000 mg (1,000 mg Oral Not Given 06/12/17 1314)  0.9 %  sodium chloride infusion (not administered)  sodium chloride 0.9 % bolus 1,000 mL (0 mLs Intravenous Stopped 06/12/17 1411)  cefTRIAXone (ROCEPHIN) 1 g in dextrose 5 % 50 mL IVPB (0 g Intravenous Stopped 06/12/17 1449)  azithromycin (ZITHROMAX) 500 mg in dextrose 5 % 250 mL IVPB (0 mg Intravenous Stopped 06/12/17 1609)    Vitals:   06/12/17 1400 06/12/17 1454 06/12/17 1500 06/12/17 1615  BP:  (!) 97/51 (!) 86/44 (!) 91/43  Pulse: 79 80 71 62  Resp: 20 (!) 25 (!) 23 17  Temp:  98.8 F (37.1 C)    TempSrc:  Oral    SpO2: 93% 94% 92% 94%  Weight:        Final diagnoses:  Community acquired pneumonia, unspecified laterality    Admission/ observation were discussed with the admitting physician, patient and/or family and they are comfortable with the plan.    Final Clinical Impressions(s) / ED Diagnoses   Final diagnoses:  Community acquired pneumonia, unspecified laterality    ED Discharge Orders    None       Deno Etienne, DO 06/12/17 1628

## 2017-06-12 NOTE — Telephone Encounter (Signed)
Spoke with pt's son Antonio Ray (dpr on file) wants to make BQ aware that pt has been admitted to [facility] today d/t respiratory distress, altered mental status.    Forwarding to BQ to make aware.

## 2017-06-12 NOTE — ED Notes (Signed)
CBG at 19:54 was 134.

## 2017-06-12 NOTE — H&P (Signed)
Date: 06/12/2017               Patient Name:  Antonio Ray MRN: [id number]  DOB: 27-Mar-1934 Age / Sex: 83 y.o., male   PCP: [name], MD         Medical Service: Internal Medicine Teaching Service         Attending Physician: Dr. Aldine Contes, MD    First Contact: Dr. Lars Mage Pager: [phone]  Second Contact: Dr. Burgess Estelle Pager: [phone]       After Hours (After 5p/  First Contact Pager: [phone]  weekends / holidays): Second Contact Pager: [phone]   Chief Complaint: Altered mental status  History of Present Illness: Fever a 83 year old male with past medical history of COPD, type 2 diabetes mellitus, CKD stage III, acute on chronic combined systolic and diastolic CHF, hyperlipidemia, achalasia who presents with altered mental status.  Patient was brought to [facility] ED this morning following an episode of altered mental status.  The patient was last seen normal by his daughter at 7:30 AM this morning 06/12/2017.  His daughter had dropped off breakfast this morning and subsequently called him at 1030 when the patient did not answer his phone call.  The daughter got concerned and at 62 she drove to his house where she found him confused and acting differently from his baseline.  The patient states that he felt hot and had chills this morning.  He also states that he has had dry cough for several months now, sneezes often for the past few weeks, gets shortness of breath when he walks up a flight of stairs.  At baseline the patient lives independently and is able to do his tasks independently.  He drives, completes his ADLs, walks 1-2 miles per day.  ED course: Chest x-ray showed atypical pneumonia.  EMS placed patient on 6 L via nasal cannula, however the patient's son had decreased it to 2 L.  Patient was also given rocephin, azithromycin.   Meds:  Current Meds  Medication Sig  . albuterol (PROVENTIL HFA;VENTOLIN HFA) 108 (90 Base) MCG/ACT inhaler Inhale 2  puffs into the lungs every 6 (six) hours as needed for wheezing or shortness of breath.  Marland Kitchen aspirin 81 MG EC tablet Take 81 mg by mouth daily.    . Cholecalciferol (VITAMIN D) 2000 units tablet Take 2,000 Units by mouth daily.   Marland Kitchen donepezil (ARICEPT) 10 MG tablet Take 1 tablet (10 mg total) by mouth every morning.  . fenofibrate (TRICOR) 145 MG tablet TAKE 1 TABLET BY MOUTH EVERY DAY (Patient taking differently: Take 145 mg by mouth once a day)  . finasteride (PROSCAR) 5 MG tablet Take 5 mg by mouth daily.  . furosemide (LASIX) 20 MG tablet Take 2 tablets (40 mg total) by mouth daily.  . insulin aspart (NOVOLOG FLEXPEN) 100 UNIT/ML FlexPen Inject 5-6 Units into the skin See admin instructions. (Patient taking differently: Inject 5-7 Units 3 (three) times daily with meals into the skin. )  . Insulin Glargine (BASAGLAR KWIKPEN) 100 UNIT/ML SOPN INJECT 16 UNITS UNDER THE SKIN EVERY MORNING  . loratadine (CLARITIN) 10 MG tablet Take 1 tablet (10 mg total) by mouth daily.  . memantine (NAMENDA) 10 MG tablet Take 1 tablet (10 mg total) by mouth 2 (two) times daily.  . Multiple Vitamins-Minerals (PRESERVISION AREDS 2) CAPS Take 1 capsule by mouth 2 (two) times daily.  . nitroGLYCERIN (NITRODUR - DOSED IN MG/24 HR) 0.2 mg/hr patch  Place 1 patch (0.2 mg total) onto the skin daily. Alternates patch placement with odd/even days.  . pantoprazole (PROTONIX) 20 MG tablet TAKE 1 TABLET BY MOUTH DAILY  . pravastatin (PRAVACHOL) 40 MG tablet Take 1 tablet (40 mg total) by mouth at bedtime.  Marland Kitchen SPIRIVA HANDIHALER 18 MCG inhalation capsule INHALE CONTENTS OF 1 CAPSULE VIA HANDIHALER ONCE DAILY  . SYMBICORT 160-4.5 MCG/ACT inhaler INHALE 2 PUFFS BY MOUTH TWICE DAILY     Allergies: Allergies as of 06/12/2017 - Review Complete 06/12/2017  Allergen Reaction Noted  . Codeine Other (See Comments)   . Dilaudid [hydromorphone hcl] Other (See Comments) 09/10/2013  . Flomax [tamsulosin hcl] Shortness Of Breath 05/19/2012    . Morphine and related Other (See Comments) 05/20/2011  . Sulfa antibiotics Shortness Of Breath 05/23/2012  . Beta adrenergic blockers Other (See Comments) 03/31/2015  . Carvedilol Other (See Comments) 04/18/2011   Past Medical History:  Diagnosis Date  . Achalasia   . AICD (automatic cardioverter/defibrillator) present 03/30/2011   Analyze ST study patient  . Anginal pain ([place])   . BPH (benign prostatic hyperplasia)   . CHF (congestive heart failure) ([place])   . Chronic systolic dysfunction of left ventricle    EF 30-35%, CLASS II - III SYMPTOMS; intolerant to Coreg  . CKD (chronic kidney disease) stage 3, GFR 30-59 ml/min ([name]) 06/08/2012  . COPD (chronic obstructive pulmonary disease) ([place])   . Coronary artery disease    History of remote anterior MI with PCI to LAD in 2006; most recent cath 2007, no intervention required  . DOE (dyspnea on exertion)    with heavy exertion  . Dysrhythmia   . Esophageal dysmotility   . GERD (gastroesophageal reflux disease)   . GERD (gastroesophageal reflux disease)   . Head injury, closed, with concussion 2000ish  . Heart murmur    hx  . HOH (hard of hearing)   . Hyperlipidemia   . Memory loss    improved  . Myocardial infarction ([place]) 1994   ANTERIOR  . Pneumonia "several times"   aspiration pna with at least 3 admits for this 2016.   Marland Kitchen PVC's (premature ventricular contractions)   . Renal failure   . Skin cancer "several"   "forearms; head"  . Type II diabetes mellitus ([place])     Family History:   Stroke-father  Social History: No EtOH, smoking, drugs  Review of Systems: A complete ROS was negative except as per HPI.   Physical Exam: Blood pressure (!) 101/54, pulse 64, temperature 98.3 F (36.8 C), temperature source Oral, resp. rate (!) 23, weight 166 lb 3.6 oz (75.4 kg), SpO2 92 %.  Physical Exam  Constitutional: He appears well-developed and well-nourished. He is cooperative. He is easily aroused. No distress.  HENT:   Head: Normocephalic and atraumatic.  Eyes: Conjunctivae are normal.  Cardiovascular: Normal rate, regular rhythm, normal heart sounds and intact distal pulses.  Pulmonary/Chest: Effort normal and breath sounds normal. No stridor. No respiratory distress.  Abdominal: Soft. Bowel sounds are normal. He exhibits no distension. There is no tenderness.  Neurological: He is alert and easily aroused. No cranial nerve deficit.  Skin: He is not diaphoretic.  Psychiatric: He has a normal mood and affect. His behavior is normal. Judgment and thought content normal.    EKG: none  CXR (06/12/17): Increased pulmonary interstitial markings compared to previous chest x-ray, consider acute viral/atypical respiratory infection  Assessment & Plan by Problem:  83 year old male with past medical history of COPD,  diabetes mellitus, hyperlipidemia, dementia who presents with altered mental status.  Atypical pneumonia Patient presented to the hospital with AMS, cough, fever.  Patient scored 3 points per the curb 65 score which puts the patient severe risk group and estimates a 14% 30-day mortality.  It is for this reason that the patient being admitted to the hospital is appropriate. The patient meets sepsis criteria based on fever, heart rate, WBC count, suspected source of infection. The patient's lactic acid level was normal at 1.51.   The patient's chest x-ray significant for increased interstitial markings in comparison to the previous, this along with fever, tachycardia, leukocytosis makes it likely that the patient has pneumonia.  Per the ED physician's note he states that there was a questionable aspiration event that occured 2 days ago may be a possible cause of the pneumonia.  -Continue azithromycin and ceftriaxone -Continue 0.9% bolus 1000 ml IV once -Droplet precautions -Respiratory viral panel sent -Blood culture pending -Urinalysis was negative for nitrites and leukocytes -strep pneumo urinary  antigen negative  -legionella pneumophilia pending -procalcitonin pending   Diabetes Mellitus The patient takes glargine 16 units in the morning, NovoLog 5-6 units with meals. -CBG Monitoring   Dementia The patient takes Namenda 10 mg twice daily at home -Continue Namenda 10 mg twice daily and donepezil 10 mg every morning  Hyperlipidemia -Continue pravastatin 40 mg nightly, fenofibrate 145 mg daily  COPD -Continue albuterol as needed, loratadine 10 mg daily, Spiriva daily, Symbicort 2 puffs twice daily  AKI The patient's creatinine on admission was 2 and previously basedon chart review it is 1.5. This maybe due to decreased oral intake. -Held lasix  Dispo: Admit patient to Inpatient with expected length of stay greater than 2 midnights.  Signed: Lars Mage, MD Internal Medicine PGY1 Pager:[phone] 06/12/2017, 6:37 PM

## 2017-06-12 NOTE — ED Notes (Signed)
Pt had 1g tylenol by ems

## 2017-06-12 NOTE — ED Notes (Signed)
Admitting MD is here at the bedside to see pt.  Pt O2 was decreased to 1L at this time by MD.  Will wean off per MD order, family member is at the bedside and aware of this, he previously had increased O2.

## 2017-06-13 DIAGNOSIS — E785 Hyperlipidemia, unspecified: Secondary | ICD-10-CM | POA: Diagnosis not present

## 2017-06-13 DIAGNOSIS — I2510 Atherosclerotic heart disease of native coronary artery without angina pectoris: Secondary | ICD-10-CM | POA: Diagnosis present

## 2017-06-13 DIAGNOSIS — N179 Acute kidney failure, unspecified: Secondary | ICD-10-CM | POA: Diagnosis present

## 2017-06-13 DIAGNOSIS — Z95810 Presence of automatic (implantable) cardiac defibrillator: Secondary | ICD-10-CM | POA: Diagnosis not present

## 2017-06-13 DIAGNOSIS — K219 Gastro-esophageal reflux disease without esophagitis: Secondary | ICD-10-CM | POA: Diagnosis present

## 2017-06-13 DIAGNOSIS — E119 Type 2 diabetes mellitus without complications: Secondary | ICD-10-CM

## 2017-06-13 DIAGNOSIS — F0390 Unspecified dementia without behavioral disturbance: Secondary | ICD-10-CM

## 2017-06-13 DIAGNOSIS — H9190 Unspecified hearing loss, unspecified ear: Secondary | ICD-10-CM | POA: Diagnosis present

## 2017-06-13 DIAGNOSIS — J189 Pneumonia, unspecified organism: Secondary | ICD-10-CM | POA: Diagnosis not present

## 2017-06-13 DIAGNOSIS — I252 Old myocardial infarction: Secondary | ICD-10-CM | POA: Diagnosis not present

## 2017-06-13 DIAGNOSIS — A419 Sepsis, unspecified organism: Secondary | ICD-10-CM | POA: Diagnosis present

## 2017-06-13 DIAGNOSIS — Z7982 Long term (current) use of aspirin: Secondary | ICD-10-CM | POA: Diagnosis not present

## 2017-06-13 DIAGNOSIS — N400 Enlarged prostate without lower urinary tract symptoms: Secondary | ICD-10-CM | POA: Diagnosis present

## 2017-06-13 DIAGNOSIS — R4182 Altered mental status, unspecified: Secondary | ICD-10-CM | POA: Diagnosis not present

## 2017-06-13 DIAGNOSIS — J449 Chronic obstructive pulmonary disease, unspecified: Secondary | ICD-10-CM

## 2017-06-13 DIAGNOSIS — N183 Chronic kidney disease, stage 3 (moderate): Secondary | ICD-10-CM | POA: Diagnosis present

## 2017-06-13 DIAGNOSIS — Z794 Long term (current) use of insulin: Secondary | ICD-10-CM | POA: Diagnosis not present

## 2017-06-13 DIAGNOSIS — Z85828 Personal history of other malignant neoplasm of skin: Secondary | ICD-10-CM | POA: Diagnosis not present

## 2017-06-13 DIAGNOSIS — E1122 Type 2 diabetes mellitus with diabetic chronic kidney disease: Secondary | ICD-10-CM | POA: Diagnosis present

## 2017-06-13 DIAGNOSIS — I5042 Chronic combined systolic (congestive) and diastolic (congestive) heart failure: Secondary | ICD-10-CM | POA: Diagnosis present

## 2017-06-13 DIAGNOSIS — Z79899 Other long term (current) drug therapy: Secondary | ICD-10-CM | POA: Diagnosis not present

## 2017-06-13 LAB — COMPREHENSIVE METABOLIC PANEL
ALBUMIN: 2.4 g/dL — AB (ref 3.5–5.0)
ALT: 19 U/L (ref 17–63)
ANION GAP: 6 (ref 5–15)
AST: 26 U/L (ref 15–41)
Alkaline Phosphatase: 40 U/L (ref 38–126)
BILIRUBIN TOTAL: 0.5 mg/dL (ref 0.3–1.2)
BUN: 33 mg/dL — ABNORMAL HIGH (ref 6–20)
CHLORIDE: 108 mmol/L (ref 101–111)
CO2: 29 mmol/L (ref 22–32)
Calcium: 8.4 mg/dL — ABNORMAL LOW (ref 8.9–10.3)
Creatinine, Ser: 1.8 mg/dL — ABNORMAL HIGH (ref 0.61–1.24)
GFR calc Af Amer: 39 mL/min — ABNORMAL LOW (ref >60)
GFR calc non Af Amer: 34 mL/min — ABNORMAL LOW (ref >60)
GLUCOSE: 99 mg/dL (ref 65–99)
POTASSIUM: 4.2 mmol/L (ref 3.5–5.1)
SODIUM: 143 mmol/L (ref 135–145)
TOTAL PROTEIN: 5.5 g/dL — AB (ref 6.5–8.1)

## 2017-06-13 LAB — RESPIRATORY PANEL BY PCR
Adenovirus: NOT DETECTED
BORDETELLA PERTUSSIS-RVPCR: NOT DETECTED
CORONAVIRUS HKU1-RVPPCR: NOT DETECTED
CORONAVIRUS NL63-RVPPCR: NOT DETECTED
CORONAVIRUS OC43-RVPPCR: NOT DETECTED
Chlamydophila pneumoniae: NOT DETECTED
Coronavirus 229E: NOT DETECTED
Influenza A: NOT DETECTED
Influenza B: NOT DETECTED
METAPNEUMOVIRUS-RVPPCR: NOT DETECTED
Mycoplasma pneumoniae: NOT DETECTED
PARAINFLUENZA VIRUS 2-RVPPCR: NOT DETECTED
PARAINFLUENZA VIRUS 3-RVPPCR: NOT DETECTED
Parainfluenza Virus 1: NOT DETECTED
Parainfluenza Virus 4: NOT DETECTED
RHINOVIRUS / ENTEROVIRUS - RVPPCR: NOT DETECTED
Respiratory Syncytial Virus: NOT DETECTED

## 2017-06-13 LAB — CBC
HCT: 40.2 % (ref 39.0–52.0)
Hemoglobin: 12.1 g/dL — ABNORMAL LOW (ref 13.0–17.0)
MCH: 27.1 pg (ref 26.0–34.0)
MCHC: 30.1 g/dL (ref 30.0–36.0)
MCV: 90.1 fL (ref 78.0–100.0)
PLATELETS: 240 10*3/uL (ref 150–400)
RBC: 4.46 MIL/uL (ref 4.22–5.81)
RDW: 16.7 % — AB (ref 11.5–15.5)
WBC: 9.8 10*3/uL (ref 4.0–10.5)

## 2017-06-13 LAB — GLUCOSE, CAPILLARY
GLUCOSE-CAPILLARY: 162 mg/dL — AB (ref 65–99)
GLUCOSE-CAPILLARY: 90 mg/dL (ref 65–99)
GLUCOSE-CAPILLARY: 157 mg/dL — AB (ref 65–99)
Glucose-Capillary: 119 mg/dL — ABNORMAL HIGH (ref 65–99)
Glucose-Capillary: 93 mg/dL (ref 65–99)

## 2017-06-13 LAB — PROCALCITONIN: Procalcitonin: 0.47 ng/mL

## 2017-06-13 LAB — LEGIONELLA PNEUMOPHILA SEROGP 1 UR AG: L. pneumophila Serogp 1 Ur Ag: NEGATIVE

## 2017-06-13 MED ORDER — DEXTROSE 5 % IV SOLN
500.0000 mg | Freq: Every day | INTRAVENOUS | Status: DC
  Administered 2017-06-13 – 2017-06-14 (×2): 500 mg via INTRAVENOUS
  Filled 2017-06-13 (×2): qty 500

## 2017-06-13 MED ORDER — INSULIN ASPART 100 UNIT/ML ~~LOC~~ SOLN: 0.0000 [IU] | Freq: Three times a day (TID) | SUBCUTANEOUS | Status: DC

## 2017-06-13 MED ORDER — DEXTROSE 5 % IV SOLN
1.0000 g | Freq: Every day | INTRAVENOUS | Status: DC
  Administered 2017-06-13 – 2017-06-14 (×2): 1 g via INTRAVENOUS
  Filled 2017-06-13 (×2): qty 10

## 2017-06-13 NOTE — Progress Notes (Signed)
   Subjective: Antonio Ray was seen sitting in chair beside his bed this morning post receiving a bath.  He stated that he is doing well today.  He denied any nausea, vomiting, shortness of breath.  He did however state that he had some chills earlier in the morning.  Objective:  Vital signs in last 24 hours: Vitals:   06/12/17 2042 06/13/17 0532 06/13/17 0843 06/13/17 1402  BP: (!) 99/50 125/60  (!) 95/43  Pulse: 68 95 89 85  Resp:   20 20  Temp: 98.1 F (36.7 C) 98 F (36.7 C)  99.1 F (37.3 C)  TempSrc: Oral Oral  Oral  SpO2: 90% 92% 91% 94%  Weight: 168 lb 12.8 oz (76.6 kg)     Height: _0  (1.727 m)      Physical Exam  Constitutional: He appears well-developed and well-nourished. No distress.  HENT:  Head: Normocephalic and atraumatic.  Eyes: Conjunctivae are normal.  Cardiovascular: Normal rate, regular rhythm and normal heart sounds.  Pulmonary/Chest: Effort normal and breath sounds normal. No stridor. No respiratory distress. He has no rales.  Abdominal: Soft. Bowel sounds are normal. He exhibits no distension. There is no tenderness.  Neurological: He is alert.  Skin: He is not diaphoretic.  Psychiatric: He has a normal mood and affect. His behavior is normal. Judgment and thought content normal.    Assessment/Plan:  Atypical pneumonia Patient presented to the hospital with AMS, cough, fever. He also met sepsis criteria based on fever, heart rate, WBC count, suspected source of infection.  The patient is thought to have atypical pneumonia based on the increased interstitial markings present on chest x-ray (06/12/17) although there is no tubular viral tests have returned positive thus far.  -Continue azithromycin and ceftriaxone -Continue 0.9% bolus 1000 ml IV once -Droplet precautions -Respiratory viral panel not detected for any -Blood culture no growth for 1 day -Urinalysis was negative for nitrites and leukocytes -strep pneumo urinary antigen negative    -legionella pneumophilia negative -procalcitonin 0.12, 0.47  -Soft diet  Diabetes Mellitus The patient's glucose over the past 24 hours has ranged 90-120. The patient takes glargine 16 units in the morning, NovoLog 5-6 units with meals. -CBG Monitoring   Dementia The patient takes Namenda 10 mg twice daily at home -Continue Namenda 10 mg twice daily and donepezil 10 mg every morning  Hyperlipidemia -Continue pravastatin 40 mg nightly, fenofibrate 145 mg daily  COPD -Continue albuterol as needed, loratadine 10 mg daily, Spiriva daily, Symbicort 2 puffs twice daily  AKI The patient's creatinine on admission was 2 and previously based on chart review it is 1.5. This maybe due to decreased oral intake. The patient's creatinine today 06/13/2017 decreased to 1.80. -Held lasix   Dispo: Anticipated discharge in approximately 1 day.   Lars Mage, MD Internal Medicine PGY1 Pager:[phone] 06/13/2017, 3:58 PM

## 2017-06-13 NOTE — Progress Notes (Signed)
  Date: 06/13/2017  Patient name: Antonio Ray  Medical record number: [id number]  Date of birth: [birth year]   I have seen and evaluated Antonio Ray and discussed their care with the Residency Team.  In brief, patient is an 83 year old male with a past medical history of COPD, diabetes, CKD stage III, chronic combined systolic and diastolic heart failure, hyperlipidemia who presented to the ED with altered mental status for 1 day.  History obtained from chart as patient's daughter is not at bedside.  Patient was last seen normal by his daughter yesterday morning at breakfast but he did not respond to her phone calls approximately 3 hours later.  The daughter got concerned and Dr.'s house where she found him confused and acting differently from his baseline.  Patient states that he had chills yesterday morning.  He does complain of a chronic dry cough as well as chronic shortness of breath with dyspnea on exertion.  No objective fevers noted, no chest pain, no lightheadedness, syncope, no focal weakness, no nausea or vomiting, no diarrhea, no abdominal pain, no sick contacts.  Today patient states that he feels much better and is at his baseline mentally.  He did complain of some chills overnight but did not have a fever.  PMHx, Fam Hx, and/or Soc Hx : As per resident admit note  Vitals:   06/13/17 0843 06/13/17 1402  BP:  (!) 95/43  Pulse: 89 85  Resp: 20 20  Temp:  99.1 F (37.3 C)  SpO2: 91% 94%   General: Awake alert and oriented x3, NAD CVS: Regular rate and rhythm, normal heart sounds Lungs: Bibasilar crackles noted, right greater than left Abdomen: Soft, nontender, nondistended, normoactive bowel sounds Extremities: No edema noted  Assessment and Plan: I have seen and evaluated the patient as outlined above. I agree with the formulated Assessment and Plan as detailed in the residents' note, with the following changes:   1.  Likely pneumonia: -Patient presented to the ED  with altered mental status, chills and a cough and is found to be febrile in the ED.  Chest x-ray showed bilateral increased interstitial markings compatible with atypical pneumonia. I suspect that his altered mental status is likely secondary to his acute infection and given his chest x-ray findings, mildly increased procalcitonin as well as leukocytosis I suspect he likely has pneumonia. -We will continue with azithromycin and ceftriaxone for now to cover for community acquired pneumonia -Respiratory viral panel was negative -We will follow blood cultures -Continue with oxygen as needed via nasal cannula.  We will attempt to wean him off this today. -Patient was also noted to have AKI on CKD.  Baseline creatinine is approximately 1.5 and admission creatinine was 2.  I suspect this is likely prerenal etiology at this is improved with IV fluids to 1.8.  We will continue to monitor off diuretics. -If patient continues to improve will likely discharge home tomorrow  Antonio Contes, MD 11/13/20182:54 PM

## 2017-06-13 NOTE — Evaluation (Signed)
Occupational Therapy Evaluation [name] MRN: [id number] DOB: 1934/06/21 Today's Date: 06/13/2017    History of Present Illness This 83 yo male admitted with confusion as well as dry cough.  Dx:  atypical PNA.  PMH includes:  COPD, DM, dementia   Clinical Impression   Pt admitted with the above diagnosis.  He demonstrates the below listed deficits and currently able to perform ADLs and functional mobility with supervision.  Sats on RA >92% at rest and 87% with activity.  He lives alone with good support from family.   He was independent with ADLs.   He should quickly return to his baseline, and doesn't require skilled OT.  Recommend he walk with nsg to increase activity while in hospital.  OT will sign off.     Follow Up Recommendations  No OT follow up;Supervision - Intermittent    Equipment Recommendations  None recommended by OT    Recommendations for Other Services       Precautions / Restrictions Precautions Precautions: Fall Precaution Comments: Droplet precautions, SOB, dec SpO2      Mobility Bed Mobility               General bed mobility comments: Pt sitting up in chair   Transfers Overall transfer level: Needs assistance Equipment used: None Transfers: Sit to/from Stand;Stand Pivot Transfers Sit to Stand: Supervision Stand pivot transfers: Supervision            Balance Overall balance assessment: Needs assistance Sitting-balance support: Feet supported;No upper extremity supported Sitting balance-Leahy Scale: Good     Standing balance support: During functional activity Standing balance-Leahy Scale: Good Standing balance comment: simulated shower in standing without LOB                            ADL either performed or assessed with clinical judgement   ADL Overall ADL's : Needs assistance/impaired Eating/Feeding: Independent   Grooming: Wash/dry hands;Wash/dry face;Oral  care;Supervision/safety;Standing   Upper Body Bathing: Set up;Sitting   Lower Body Bathing: Supervison/ safety;Sit to/from stand   Upper Body Dressing : Set up;Sitting   Lower Body Dressing: Supervision/safety;Sit to/from stand   Toilet Transfer: Supervision/safety;Ambulation;Comfort height toilet   Toileting- Clothing Manipulation and Hygiene: Supervision/safety;Sit to/from stand   Tub/ Shower Transfer: Walk-in shower;Supervision/safety;Ambulation;Shower seat   Functional mobility during ADLs: Supervision/safety General ADL Comments: Pt able to simulate shower in standing with supervision.  Discussed recommendation that he sit to shower with pt.      Vision Patient Visual Report: No change from baseline       Perception     Praxis      Pertinent Vitals/Pain Pain Assessment: No/denies pain     Hand Dominance Right   Extremity/Trunk Assessment Upper Extremity Assessment Upper Extremity Assessment: Overall WFL for tasks assessed   Lower Extremity Assessment Lower Extremity Assessment: Defer to PT evaluation   Cervical / Trunk Assessment Cervical / Trunk Assessment: Normal   Communication Communication Communication: HOH   Cognition Arousal/Alertness: Awake/alert Behavior During Therapy: WFL for tasks assessed/performed Overall Cognitive Status: History of cognitive impairments - at baseline                                 General Comments: Pt with h/o dementia.  Cognition WFL for basic tasks    General Comments  02 sats at rest on RA 92-98%; with actvity sats  decreased to 87%.  Pt placed back on 2L 02 with sats rebounding to 98%    Exercises Exercises: Other exercises Other Exercises Other Exercises: Pt instructed to perform shoulder flexion/abduction frequently during the day to increase activity    Shoulder Instructions      Home Living Family/patient expects to be discharged to:: Private residence Living Arrangements: Alone Available  Help at Discharge: Family;Available PRN/intermittently Type of Home: House Home Access: Stairs to enter [employer] of Steps: 3-7 Entrance Stairs-Rails: Left;Right;Can reach both Home Layout: Two level;Able to live on main level with bedroom/bathroom;Full bath on main level Alternate Level Stairs-Number of Steps: flight Alternate Level Stairs-Rails: Right;Left Bathroom Shower/Tub: Occupational psychologist: Handicapped height Bathroom Accessibility: Yes   Home Equipment: Cane - single point;Walker - 2 wheels;Walker - 4 wheels;Shower seat;Grab bars - tub/shower;Hand held shower head          Prior Functioning/Environment Level of Independence: Independent with assistive device(s)        Comments: uses a walking stick when he walks in the woods.  Still drives, microwaves meals.  Family assists with filling medication boxes         OT Problem List: Decreased strength;Decreased activity tolerance;Impaired balance (sitting and/or standing);Decreased safety awareness;Decreased knowledge of use of DME or AE;Decreased cognition      OT Treatment/Interventions:      OT Goals(Current goals can be found in the care plan section) Acute Rehab OT Goals Patient Stated Goal: to go home  OT Goal Formulation: All assessment and education complete, DC therapy  OT Frequency:     Barriers to D/C:            Co-evaluation              AM-PAC PT "6 Clicks" Daily Activity     Outcome Measure Help from another person eating meals?: None Help from another person taking care of personal grooming?: A Little Help from another person toileting, which includes using toliet, bedpan, or urinal?: A Little Help from another person bathing (including washing, rinsing, drying)?: A Little Help from another person to put on and taking off regular upper body clothing?: A Little Help from another person to put on and taking off regular lower body clothing?: A Little 6 Click Score:  19   End of Session Equipment Utilized During Treatment: Oxygen Nurse Communication: Mobility status  Activity Tolerance: Patient tolerated treatment well Patient left: in chair;with call bell/phone within reach;with chair alarm set;with family/visitor present  OT Visit Diagnosis: Unsteadiness on feet (R26.81)                Time: 0814-4818 OT Time Calculation (min): 33 min Charges:  OT General Charges $OT Visit: 1 Visit OT Evaluation $OT Eval Low Complexity: 1 Low OT Treatments $Self Care/Home Management : 8-22 mins G-Codes: OT G-codes **NOT FOR INPATIENT CLASS** Functional Assessment Tool Used: AM-PAC 6 Clicks Daily Activity Functional Limitation: Self care Self Care Current Status (H6314): At least 40 percent but less than 60 percent impaired, limited or restricted Self Care Goal Status (H7026): At least 40 percent but less than 60 percent impaired, limited or restricted Self Care Discharge Status [phone]): At least 40 percent but less than 60 percent impaired, limited or restricted   [facility], OTR/L [phone]   Lucille Passy M 06/13/2017, 2:16 PM

## 2017-06-13 NOTE — Progress Notes (Signed)
Patient admitted to room 5W29, alert/oriented x3. Patient ambulated from stretcher to bed. Voided and bm upon arrival. Placed on 2L oxygen for o2 sat of 90%.   Sacrum pink and intact. Left ear abrasion with scab, discoloration and bruising noted bilateral arms and legs. Right foot medial upper plantar surface, calus/hard to touch, dark discoloration noted. Patient states it is old no pain noted.   Patient resting in low bed, siderails up x2 and educated about using call bell.

## 2017-06-13 NOTE — Evaluation (Signed)
Physical Therapy Evaluation [name] MRN: [id number] DOB: 23-Jul-1934 Today's Date: 06/13/2017   History of Present Illness  Fever a 84 year old male with past medical history of COPD, type 2 diabetes mellitus, CKD stage III, acute on chronic combined systolic and diastolic CHF, hyperlipidemia, achalasia who presents with altered mental status.  Patient was brought to [facility] ED this morning following an episode of altered mental status.  Clinical Impression  Pt admitted with above. Pt anxious regarding SOB and mobility. Pt with cough as well. Pt SPO2 dec into 70s on 2lO2 via [place] during amb. Upon 2 min rest pt returned to low 90s but then had a coughing fit and is now staying around 80s on 2LO2 via [place] on RA with noted SOB. RN made aware. Pt was indep and driving PTA but now requires 24/7 assist for safety with all mobility. Anticipate once patient medically treated and breathing improves pt anxiety will decrease and pt mobility with improve. If not pt may need HHPT and 24/7 assist for safe d/c home.    Follow Up Recommendations No PT follow up;Supervision/Assistance - 24 hour    Equipment Recommendations  None recommended by PT(pt has RW)    Recommendations for Other Services       Precautions / Restrictions Precautions Precautions: Fall Precaution Comments: Droplet precautions, SOB, dec SpO2      Mobility  Bed Mobility Overal bed mobility: Needs Assistance Bed Mobility: Supine to Sit     Supine to sit: Min assist     General bed mobility comments: pt brought LEs off EOB, reached for PT for assist to elevated trunk  Transfers Overall transfer level: Needs assistance Equipment used: Rolling walker (2 wheeled) Transfers: Sit to/from Stand Sit to Stand: Min assist         General transfer comment: v/c's to push up from bed, minA for safety as pt still pulled up from walker  Ambulation/Gait Ambulation/Gait assistance: Min assist Ambulation  Distance (Feet): 75 Feet Assistive device: Rolling walker (2 wheeled) Gait Pattern/deviations: Step-through pattern;Decreased stride length;Drifts right/left Gait velocity: impulsively quick, v/c's to slow down   General Gait Details: +SOB, SPO2 in 70s on 2LO2 via [place], pt anxious about "cant get my breath"  Stairs            Wheelchair Mobility    Modified Rankin (Stroke Patients Only)       Balance Overall balance assessment: Needs assistance Sitting-balance support: Feet supported;No upper extremity supported Sitting balance-Leahy Scale: Fair     Standing balance support: Bilateral upper extremity supported Standing balance-Leahy Scale: Poor Standing balance comment: dependent on external assist.                             Pertinent Vitals/Pain Pain Assessment: No/denies pain    Home Living Family/patient expects to be discharged to:: Private residence Living Arrangements: Alone Available Help at Discharge: Family;Available PRN/intermittently Type of Home: House Home Access: Stairs to enter Entrance Stairs-Rails: Left;Right;Can reach both Entrance Stairs-Number of Steps: 3-7 Home Layout: Two level;Able to live on main level with bedroom/bathroom;Full bath on main level Home Equipment: Cane - single point;Walker - 2 wheels;Walker - 4 wheels      Prior Function Level of Independence: Independent with assistive device(s)         Comments: uses cane only when he goes out and walks in the woods.  Does not use O2 at baseline.  Hand Dominance   Dominant Hand: Right    Extremity/Trunk Assessment   Upper Extremity Assessment Upper Extremity Assessment: Overall WFL for tasks assessed    Lower Extremity Assessment Lower Extremity Assessment: Overall WFL for tasks assessed    Cervical / Trunk Assessment Cervical / Trunk Assessment: Normal  Communication   Communication: HOH  Cognition Arousal/Alertness: Awake/alert Behavior During  Therapy: Anxious Overall Cognitive Status: Impaired/Different from baseline Area of Impairment: Problem solving                             Problem Solving: Slow processing;Difficulty sequencing General Comments: pt anxious about door being closed and SOB with mobility. due to anxiety pt with noted delayed processing and abiltiy to focus on task at hand      General Comments      Exercises     Assessment/Plan    PT Assessment Patient needs continued PT services  PT Problem List Decreased strength;Decreased activity tolerance;Decreased balance;Decreased mobility;Decreased coordination;Decreased knowledge of use of DME;Decreased safety awareness       PT Treatment Interventions DME instruction;Gait training;Stair training;Functional mobility training;Therapeutic activities;Therapeutic exercise;Balance training    PT Goals (Current goals can be found in the Care Plan section)  Acute Rehab PT Goals Patient Stated Goal: improve my breathing PT Goal Formulation: With patient Time For Goal Achievement: 06/20/17 Potential to Achieve Goals: Good    Frequency Min 3X/week   Barriers to discharge Decreased caregiver support lives alone    Co-evaluation               AM-PAC PT "6 Clicks" Daily Activity  Outcome Measure Difficulty turning over in bed (including adjusting bedclothes, sheets and blankets)?: A Lot Difficulty moving from lying on back to sitting on the side of the bed? : A Lot Difficulty sitting down on and standing up from a chair with arms (e.g., wheelchair, bedside commode, etc,.)?: A Little Help needed moving to and from a bed to chair (including a wheelchair)?: A Little Help needed walking in hospital room?: A Little Help needed climbing 3-5 steps with a railing? : A Lot 6 Click Score: 15    End of Session Equipment Utilized During Treatment: Gait belt;Oxygen(2LO2 via ) Activity Tolerance: Patient limited by fatigue(limited by SOB) Patient  left: in chair;with call bell/phone within reach;with chair alarm set Nurse Communication: Mobility status(dec SPO2 into 70s with amb) PT Visit Diagnosis: Unsteadiness on feet (R26.81)    Time: 6812-7517 PT Time Calculation (min) (ACUTE ONLY): 27 min   Charges:   PT Evaluation $PT Eval Moderate Complexity: 1 Mod PT Treatments $Gait Training: 8-22 mins   PT G Codes:   PT G-Codes **NOT FOR INPATIENT CLASS** Functional Assessment Tool Used: Clinical judgement Functional Limitation: Mobility: Walking and moving around Mobility: Walking and Moving Around Current Status (G0174): At least 20 percent but less than 40 percent impaired, limited or restricted Mobility: Walking and Moving Around Goal Status [phone]): At least 1 percent but less than 20 percent impaired, limited or restricted    Kittie Plater, PT, DPT Pager #: [phone] Office #: [phone]   [place] 06/13/2017, 8:54 AM

## 2017-06-14 LAB — BASIC METABOLIC PANEL
ANION GAP: 7 (ref 5–15)
BUN: 27 mg/dL — ABNORMAL HIGH (ref 6–20)
CHLORIDE: 105 mmol/L (ref 101–111)
CO2: 27 mmol/L (ref 22–32)
Calcium: 8.4 mg/dL — ABNORMAL LOW (ref 8.9–10.3)
Creatinine, Ser: 1.44 mg/dL — ABNORMAL HIGH (ref 0.61–1.24)
GFR calc Af Amer: 51 mL/min — ABNORMAL LOW (ref >60)
GFR, EST NON AFRICAN AMERICAN: 44 mL/min — AB (ref >60)
GLUCOSE: 111 mg/dL — AB (ref 65–99)
POTASSIUM: 3.8 mmol/L (ref 3.5–5.1)
Sodium: 139 mmol/L (ref 135–145)

## 2017-06-14 LAB — GLUCOSE, CAPILLARY
GLUCOSE-CAPILLARY: 105 mg/dL — AB (ref 65–99)
GLUCOSE-CAPILLARY: 179 mg/dL — AB (ref 65–99)

## 2017-06-14 LAB — PROCALCITONIN: Procalcitonin: 1.86 ng/mL

## 2017-06-14 MED ORDER — CEFDINIR 300 MG PO CAPS: 300.0000 mg | ORAL_CAPSULE | Freq: Two times a day (BID) | ORAL | 0 refills | Status: AC

## 2017-06-14 MED ORDER — INSULIN ASPART 100 UNIT/ML ~~LOC~~ SOLN
0.0000 [IU] | Freq: Three times a day (TID) | SUBCUTANEOUS | Status: DC
  Administered 2017-06-14: 2 [IU] via SUBCUTANEOUS

## 2017-06-14 NOTE — Progress Notes (Signed)
   Subjective: Mr. Schwartz was seen sitting in chair beside his bed this morning after finishing breakfast. He denied any miss of breath, chest pain, nausea, vomiting.  The patient states that he was able to have his breakfast without any difficulty.  When in the room the patient's nasal cannula was removed and we will monitor patient saturations at room air.  Objective:  Vital signs in last 24 hours: Vitals:   06/13/17 2116 06/14/17 0509 06/14/17 1011 06/14/17 1012  BP: (!) 103/50 (!) 105/52    Pulse: 60 60    Resp: 17 18    Temp: 98.3 F (36.8 C) 98.4 F (36.9 C)    TempSrc: Oral Oral    SpO2: 96% 98% 90% 90%  Weight:      Height:       Physical Exam  Constitutional: He appears well-developed and well-nourished. No distress.  HENT:  Head: Normocephalic and atraumatic.  Eyes: Conjunctivae are normal.  Cardiovascular: Normal rate, regular rhythm and normal heart sounds.  Pulmonary/Chest: Effort normal and breath sounds normal. No stridor. No respiratory distress.  Abdominal: Soft. Bowel sounds are normal. He exhibits no distension. There is no tenderness.  Musculoskeletal: He exhibits no edema.  Neurological: He is alert.  Skin: He is not diaphoretic.  Psychiatric: He has a normal mood and affect. His behavior is normal. Judgment and thought content normal.    Assessment/Plan:  Atypical pneumonia Patient presented to the hospital with AMS, cough, fever. He also met sepsis criteria based on fever,heart rate,WBC count, suspected source of infection.  The patient is thought to have atypical pneumonia based on the increased interstitial markings present on chest x-ray (06/12/17) although there is no tubular viral tests have returned positive thus far.  The patient is stable today and does not have any complaint of sob or chest pain.   -Continue azithromycin and ceftriaxone today. Will be discharged on cefdinir 300 po q12hr for 2 days without azithromycin as completed 3 days  therapy. -Oxygen saturation: at rest: 88%, while ambulating 10 ft=85%, ambulation with 1L=82-83%. The patient's new oxygen requirement is likely due to his pneumonia. Will monitor. -Physical therapy did not recommend any further pt   Diabetes Mellitus The patient's glucose over the past 24 hours has ranged 105-179. The patient takes glargine 16 units in the morning, NovoLog 5-6 units with meals. -CBG Monitoring  Dementia The patient takes Namenda 10 mg twice daily at home -Continue Namenda 10 mg twice daily and donepezil 10 mg every morning  Hyperlipidemia -Continue pravastatin 40 mg nightly, fenofibrate 145 mg daily  COPD -Continue albuterol as needed, loratadine 10 mg daily, Spiriva daily, Symbicort 2 puffs twice daily  AKI The patient's creatinine on admission was 2.0 and previously based on chart review it is 1.5. This maybe due to decreased oral intake. The patient's creatinine today 06/14/2017 decreased to 1.44.  -Held lasix  Dispo: Anticipated discharge in approximately today.   Lars Mage, MD Internal Medicine PGY1 Pager:[phone] 06/14/2017, 11:53 AM

## 2017-06-14 NOTE — Progress Notes (Signed)
Antonio Ray to be D/C'd Home per MD order.  Discussed with the patient and all questions fully answered.  VSS, Skin clean, dry and intact without evidence of skin break down, no evidence of skin tears noted. IV catheter discontinued intact. Site without signs and symptoms of complications. Dressing and pressure applied.  An After Visit Summary was printed and given to the patient. Patient received prescription.  D/c education completed with patient/family including follow up instructions, medication list, d/c activities limitations if indicated, with other d/c instructions as indicated by MD - patient able to verbalize understanding, all questions fully answered.   Patient instructed to return to ED, call 911, or call MD for any changes in condition.   Patient escorted via [place], and D/C home via private auto.  Richardean Chimera 06/14/2017 3:38 PM

## 2017-06-14 NOTE — Progress Notes (Signed)
Physical Therapy Treatment [name] MRN: [id number] DOB: 11/04/1933 Today's Date: 06/14/2017    History of Present Illness This 83 yo male admitted with confusion as well as dry cough.  Dx:  atypical PNA.  PMH includes:  COPD, DM, dementia    PT Comments    Pt improved from yesterday. Pt able to amb 150' without AD today and completed stair negotiation. Pt remains to have SOB and dec SpO2 with mobility while on 1Lo2 via [place]. Acute PT to con't to follow.  SATURATION QUALIFICATIONS: (This note is used to comply with regulatory documentation for home oxygen)  Patient Saturations on Room Air at Rest = 88%  Patient Saturations on Room Air while Ambulating = 85% s/p 10 feet  Patient Saturations on 1 Liters of oxygen while Ambulating = 82-83%  Please briefly explain why patient needs home oxygen: unable to maintain >88 on RA  Follow Up Recommendations  No PT follow up;Supervision/Assistance - 24 hour     Equipment Recommendations  None recommended by PT    Recommendations for Other Services       Precautions / Restrictions Precautions Precautions: Fall Restrictions Weight Bearing Restrictions: No    Mobility  Bed Mobility               General bed mobility comments: pt sitting up in chair  Transfers Overall transfer level: Needs assistance Equipment used: None Transfers: Sit to/from Stand Sit to Stand: Supervision         General transfer comment: pt with increased strength today, safe transfer techqniue  Ambulation/Gait Ambulation/Gait assistance: Min guard Ambulation Distance (Feet): 150 Feet Assistive device: None Gait Pattern/deviations: Step-through pattern;Decreased stride length Gait velocity: dec Gait velocity interpretation: Below normal speed for age/gender General Gait Details: +SOB, onset of LE weakness at 120' requiring use of pushing IV pole. Pt with no episode of LOB but did demo decreased step length and appeared  guarded   Stairs Stairs: Yes   Stair Management: One rail Right;Alternating pattern Number of Stairs: 3 General stair comments: pt with safe technique while holding onto rail, , alternating step up and descending step to  Wheelchair Mobility    Modified Rankin (Stroke Patients Only)       Balance Overall balance assessment: Needs assistance Sitting-balance support: Feet supported;No upper extremity supported Sitting balance-Leahy Scale: Good     Standing balance support: During functional activity Standing balance-Leahy Scale: Good                              Cognition Arousal/Alertness: Awake/alert Behavior During Therapy: WFL for tasks assessed/performed Overall Cognitive Status: History of cognitive impairments - at baseline                                 General Comments: Pt with h/o dementia.  Cognition WFL for basic tasks       Exercises      General Comments        Pertinent Vitals/Pain Pain Assessment: No/denies pain    Home Living                      Prior Function            PT Goals (current goals can now be found in the care plan section) Acute Rehab PT Goals Patient Stated Goal: go home Progress  towards PT goals: Progressing toward goals    Frequency    Min 3X/week      PT Plan Current plan remains appropriate    Co-evaluation              AM-PAC PT "6 Clicks" Daily Activity  Outcome Measure  Difficulty turning over in bed (including adjusting bedclothes, sheets and blankets)?: Unable Difficulty moving from lying on back to sitting on the side of the bed? : Unable Difficulty sitting down on and standing up from a chair with arms (e.g., wheelchair, bedside commode, etc,.)?: Unable Help needed moving to and from a bed to chair (including a wheelchair)?: A Little Help needed walking in hospital room?: A Little Help needed climbing 3-5 steps with a railing? : A Little 6 Click Score:  12    End of Session Equipment Utilized During Treatment: Gait belt;Oxygen Activity Tolerance: Patient limited by fatigue Patient left: in chair;with call bell/phone within reach;with chair alarm set Nurse Communication: Mobility status PT Visit Diagnosis: Unsteadiness on feet (R26.81)     Time: 3244-0102 PT Time Calculation (min) (ACUTE ONLY): 20 min  Charges:  $Gait Training: 8-22 mins                    G Codes:       Kittie Plater, PT, DPT Pager #: [phone] Office #: [phone]    [place] 06/14/2017, 10:33 AM

## 2017-06-14 NOTE — Discharge Instructions (Signed)
It was a pleasure to take care of you Mr. Lineman. During this hospitalization you were treated for pneumonia. Please finish taking the prescribed antibiotic- Cefdinir for 2 more days.  Please contact us if you continue to have any symptoms of shortness of breath, nausea, vomiting, cough, sneezing.    Community-Acquired Pneumonia, Adult Pneumonia is an infection of the lungs. One type of pneumonia can happen while a person is in a hospital. A different type can happen when a person is not in a hospital (community-acquired pneumonia). It is easy for this kind to spread from person to person. It can spread to you if you breathe near an infected person who coughs or sneezes. Some symptoms include:  A dry cough.  A wet (productive) cough.  Fever.  Sweating.  Chest pain.  Follow these instructions at home:  Take over-the-counter and prescription medicines only as told by your doctor. ? Only take cough medicine if you are losing sleep. ? If you were prescribed an antibiotic medicine, take it as told by your doctor. Do not stop taking the antibiotic even if you start to feel better.  Sleep with your head and neck raised (elevated). You can do this by putting a few pillows under your head, or you can sleep in a recliner.  Do not use tobacco products. These include cigarettes, chewing tobacco, and e-cigarettes. If you need help quitting, ask your doctor.  Drink enough water to keep your pee (urine) clear or pale yellow. A shot (vaccine) can help prevent pneumonia. Shots are often suggested for:  People older than 81 years of age.  People older than 81 years of age: ? Who are having cancer treatment. ? Who have long-term (chronic) lung disease. ? Who have problems with their body's defense system (immune system).  You may also prevent pneumonia if you take these actions:  Get the flu (influenza) shot every year.  Go to the dentist as often as told.  Wash your hands often. If soap and  water are not available, use hand sanitizer.  Contact a doctor if:  You have a fever.  You lose sleep because your cough medicine does not help. Get help right away if:  You are short of breath and it gets worse.  You have more chest pain.  Your sickness gets worse. This is very serious if: ? You are an older adult. ? Your body's defense system is weak.  You cough up blood. This information is not intended to replace advice given to you by your health care provider. Make sure you discuss any questions you have with your health care provider. Document Released: 01/04/2008 Document Revised: 12/24/2015 Document Reviewed: 11/12/2014 Elsevier Interactive Patient Education  [employer].

## 2017-06-14 NOTE — Discharge Summary (Signed)
Name: Antonio Ray MRN: [id number] DOB: [birth year] 83 y.o. PCP: [name], MD  Date of Admission: 06/12/2017 12:16 PM Date of Discharge: 06/14/2017 Attending Physician: Aldine Contes, MD  Discharge Diagnosis:  Atypical Pneumonia  Discharge Medications: Allergies as of 06/14/2017      Reactions   Codeine Other (See Comments)   Gets very angry, disoriented   Dilaudid [hydromorphone Hcl] Other (See Comments)   VERY AGITATED, HOSTILE   Flomax [tamsulosin Hcl] Shortness Of Breath   Morphine And Related Other (See Comments)   VERY AGITATED, HOSTILE   Sulfa Antibiotics Shortness Of Breath   Beta Adrenergic Blockers Other (See Comments)   Disorientation   Carvedilol Other (See Comments)   DISORIENTATION      Medication List    TAKE these medications   albuterol 108 (90 Base) MCG/ACT inhaler Commonly known as:  PROVENTIL HFA;VENTOLIN HFA Inhale 2 puffs into the lungs every 6 (six) hours as needed for wheezing or shortness of breath.   aspirin 81 MG EC tablet Take 81 mg by mouth daily.   BASAGLAR KWIKPEN 100 UNIT/ML Sopn INJECT 16 UNITS UNDER THE SKIN EVERY MORNING   cefdinir 300 MG capsule Commonly known as:  OMNICEF Take 1 capsule (300 mg total) 2 (two) times daily for 2 days by mouth.   donepezil 10 MG tablet Commonly known as:  ARICEPT Take 1 tablet (10 mg total) by mouth every morning.   fenofibrate 145 MG tablet Commonly known as:  TRICOR TAKE 1 TABLET BY MOUTH EVERY DAY What changed:    how much to take  how to take this  when to take this   finasteride 5 MG tablet Commonly known as:  PROSCAR Take 5 mg by mouth daily.   furosemide 20 MG tablet Commonly known as:  LASIX Take 2 tablets (40 mg total) by mouth daily.   insulin aspart 100 UNIT/ML FlexPen Commonly known as:  NOVOLOG FLEXPEN Inject 5-6 Units into the skin See admin instructions. What changed:    how much to take  when to take this   loratadine 10 MG tablet Commonly  known as:  CLARITIN Take 1 tablet (10 mg total) by mouth daily.   memantine 10 MG tablet Commonly known as:  NAMENDA Take 1 tablet (10 mg total) by mouth 2 (two) times daily.   nitroGLYCERIN 0.2 mg/hr patch Commonly known as:  NITRODUR - Dosed in mg/24 hr Place 1 patch (0.2 mg total) onto the skin daily. Alternates patch placement with odd/even days.   pantoprazole 20 MG tablet Commonly known as:  PROTONIX TAKE 1 TABLET BY MOUTH DAILY   pravastatin 40 MG tablet Commonly known as:  PRAVACHOL Take 1 tablet (40 mg total) by mouth at bedtime.   PRESERVISION AREDS 2 Caps Take 1 capsule by mouth 2 (two) times daily.   SPIRIVA HANDIHALER 18 MCG inhalation capsule Generic drug:  tiotropium INHALE CONTENTS OF 1 CAPSULE VIA HANDIHALER ONCE DAILY   SYMBICORT 160-4.5 MCG/ACT inhaler Generic drug:  budesonide-formoterol INHALE 2 PUFFS BY MOUTH TWICE DAILY   Vitamin D 2000 units tablet Take 2,000 Units by mouth daily.       Disposition and follow-up:   Antonio Ray was discharged from [facility] in good condition.  At the hospital follow up visit please address:  1.  Atypical pneumonia: please ensure the patient's mentation is back to baseline, that he does not have any fevers or leukocytosis. Also please check whether he has an increased oxygen  requirement.   2.  Labs / imaging needed at time of follow-up: repeat bmp to check creatinine level  3.  Pending labs/ test needing follow-up: bmp  Follow-up Appointments:   Hospital Course by problem list:  Atypical Pneumonia: Patient presented to the hospital with AMS, cough, fever. He also met sepsis criteria.  Chest x-ray showed increased pulmonary interstitial markings compared to previous chest x-ray.  The patients blood culture, strep pneumonia antigen, legionella antigen, and respiratory viral panel were all negative. The patient's urinalysis did not show any nitrites or leukocytes. Pro-calcitonin was 0.12,  0.47, and 1.86 on day of discharge, however the patient continued to feel better throughout his hospitalization.The patient was treated with azithromycin and ceftriaxone for 3 days and he was discharged on cefdinir 355m po bid for 2 days. The patient needed oxygen briefly during his hospital stay. At discharge the patient's saturation at rest was 88%, post 14fof ambulation it was 85%, and post 1L ambulation it was 82-83%.   Acute kidney injury:  The patient's creatinine on admission was 2.0 and previously based on chart review it is 1.5. This maybe due to decreased oral intake. Lasix was held. The patient's creatinine today 06/14/2017 decreased to 1.44.  Discharge Vitals:   BP 109/68 (BP Location: Right Arm)   Pulse 72   Temp 99 F (37.2 C) (Oral)   Resp 18   Ht 5' 8"  (1.727 m)   Wt 168 lb 12.8 oz (76.6 kg)   SpO2 96%   BMI 25.67 kg/m   Pertinent Labs, Studies, and Procedures:  CBC    Component Value Date/Time   WBC 9.8 06/13/2017 0427   RBC 4.46 06/13/2017 0427   HGB 12.1 (L) 06/13/2017 0427   HCT 40.2 06/13/2017 0427   PLT 240 06/13/2017 0427   MCV 90.1 06/13/2017 0427   MCV 93.8 05/19/2012 1022   MCH 27.1 06/13/2017 0427   MCHC 30.1 06/13/2017 0427   RDW 16.7 (H) 06/13/2017 0427   LYMPHSABS 0.8 06/12/2017 1229   MONOABS 0.8 06/12/2017 1229   EOSABS 0.1 06/12/2017 1229   BASOSABS 0.0 06/12/2017 1229   CXR (06/12/17): Increased pulmonary interstitial markings compared to previous chest x-ray, consider acute viral/atypical respiratory infection  strep pneumo urinary antigen negative   legionella pneumophilia negative  procalcitonin 0.12, 0.47, 1.86    Discharge Instructions: Discharge Instructions    Call MD for:  difficulty breathing, headache or visual disturbances   Complete by:  As directed    Call MD for:  extreme fatigue   Complete by:  As directed    Call MD for:  persistant dizziness or light-headedness   Complete by:  As directed    Call MD for:   persistant nausea and vomiting   Complete by:  As directed    Call MD for:  redness, tenderness, or signs of infection (pain, swelling, redness, odor or green/yellow discharge around incision site)   Complete by:  As directed    Call MD for:  temperature >100.4   Complete by:  As directed    Diet - low sodium heart healthy   Complete by:  As directed    Increase activity slowly   Complete by:  As directed       Signed: ChLars MageMD 06/14/2017, 2:15 PM  Pager: [phone]

## 2017-06-15 NOTE — Telephone Encounter (Signed)
noted 

## 2017-06-17 LAB — CULTURE, BLOOD (ROUTINE X 2)
Culture: NO GROWTH
Culture: NO GROWTH
SPECIAL REQUESTS: ADEQUATE

## 2017-07-05 ENCOUNTER — Other Ambulatory Visit: Payer: Self-pay | Admitting: Pulmonary Disease

## 2017-07-18 ENCOUNTER — Other Ambulatory Visit: Payer: Self-pay

## 2017-07-18 MED ORDER — NITROGLYCERIN 0.2 MG/HR TD PT24: 0.2000 mg | MEDICATED_PATCH | Freq: Every day | TRANSDERMAL | 3 refills | Status: DC

## 2017-07-19 DIAGNOSIS — C44229 Squamous cell carcinoma of skin of left ear and external auricular canal: Secondary | ICD-10-CM | POA: Diagnosis not present

## 2017-07-20 DIAGNOSIS — R3914 Feeling of incomplete bladder emptying: Secondary | ICD-10-CM | POA: Diagnosis not present

## 2017-07-20 DIAGNOSIS — N401 Enlarged prostate with lower urinary tract symptoms: Secondary | ICD-10-CM | POA: Diagnosis not present

## 2017-07-26 DIAGNOSIS — H9190 Unspecified hearing loss, unspecified ear: Secondary | ICD-10-CM | POA: Insufficient documentation

## 2017-07-26 DIAGNOSIS — R011 Cardiac murmur, unspecified: Secondary | ICD-10-CM | POA: Insufficient documentation

## 2017-07-26 DIAGNOSIS — I509 Heart failure, unspecified: Secondary | ICD-10-CM | POA: Insufficient documentation

## 2017-07-26 DIAGNOSIS — R0609 Other forms of dyspnea: Secondary | ICD-10-CM

## 2017-07-26 DIAGNOSIS — C4490 Unspecified malignant neoplasm of skin, unspecified: Secondary | ICD-10-CM | POA: Insufficient documentation

## 2017-07-26 DIAGNOSIS — S060X9A Concussion with loss of consciousness of unspecified duration, initial encounter: Secondary | ICD-10-CM | POA: Insufficient documentation

## 2017-07-26 DIAGNOSIS — I209 Angina pectoris, unspecified: Secondary | ICD-10-CM | POA: Insufficient documentation

## 2017-07-26 DIAGNOSIS — K224 Dyskinesia of esophagus: Secondary | ICD-10-CM | POA: Insufficient documentation

## 2017-07-26 DIAGNOSIS — N19 Unspecified kidney failure: Secondary | ICD-10-CM | POA: Insufficient documentation

## 2017-07-26 DIAGNOSIS — I519 Heart disease, unspecified: Secondary | ICD-10-CM | POA: Insufficient documentation

## 2017-07-26 DIAGNOSIS — S060XAA Concussion with loss of consciousness status unknown, initial encounter: Secondary | ICD-10-CM | POA: Insufficient documentation

## 2017-07-26 DIAGNOSIS — I499 Cardiac arrhythmia, unspecified: Secondary | ICD-10-CM | POA: Insufficient documentation

## 2017-07-31 ENCOUNTER — Other Ambulatory Visit: Payer: Self-pay | Admitting: Pulmonary Disease

## 2017-08-05 DIAGNOSIS — J209 Acute bronchitis, unspecified: Secondary | ICD-10-CM | POA: Diagnosis not present

## 2017-08-07 ENCOUNTER — Telehealth: Payer: Self-pay | Admitting: Pulmonary Disease

## 2017-08-07 NOTE — Telephone Encounter (Signed)
Spoke with pt's son, aware of recs.  Nothing further needed.

## 2017-08-07 NOTE — Telephone Encounter (Signed)
Called and spoke with pt's son, Antonio Ray. Antonio Ray states pt reports of prod cough with light yellow mucus.  Denies fever, chills, sweats, body aches or other symptoms.  Pt went to UC on Saturday. Per Antonio Ray CXR preformed at [facility] was normal.  Pt was prescribed prednisone taper & zpak, as wheezing was heard upon exam.  Antonio Ray wanted to be sure that BQ agreed with this treatment plan, and if ROV is needed.   BQ please advise. Thanks.   Allergies  Allergen Reactions  . Codeine Other (See Comments)    Gets very angry, disoriented  . Dilaudid [Hydromorphone Hcl] Other (See Comments)    VERY AGITATED, HOSTILE  . Flomax [Tamsulosin Hcl] Shortness Of Breath  . Morphine And Related Other (See Comments)    VERY AGITATED, HOSTILE  . Sulfa Antibiotics Shortness Of Breath  . Beta Adrenergic Blockers Other (See Comments)    Disorientation  . Carvedilol Other (See Comments)    DISORIENTATION

## 2017-08-07 NOTE — Telephone Encounter (Signed)
I agree that he should be treated this way, he needs to call us if no improvement or come in sooner if necessary.  I recommend that he receive prednisone 20mg  daily x 5 days

## 2017-08-09 ENCOUNTER — Telehealth: Payer: Self-pay | Admitting: Pulmonary Disease

## 2017-08-09 DIAGNOSIS — R0609 Other forms of dyspnea: Principal | ICD-10-CM

## 2017-08-09 NOTE — Telephone Encounter (Signed)
Called and spoke with pt's son, [name].  [name] states pt completed zpak (unsure when) and prednisone taper will be completed tomorrow. [name] states symptoms are no better and no worse.  Pt reports of prod cough with tan mucus & occ heartburn. Denies additional symptoms.  sats at rest have been around 95%.  [name] is requesting recommendations.   BQ please advise. Thanks.

## 2017-08-09 NOTE — Telephone Encounter (Signed)
Spoke with pt's son [name], aware of recs.  Pt scheduled to see TP tomorrow at 3:00, cxr ordered for prior to ov.  Nothing further needed.

## 2017-08-09 NOTE — Telephone Encounter (Signed)
Needs OV with CXR Extend prednisone 20mg  daily x5 days

## 2017-08-10 ENCOUNTER — Ambulatory Visit (INDEPENDENT_AMBULATORY_CARE_PROVIDER_SITE_OTHER): Payer: Medicare Other | Admitting: Adult Health

## 2017-08-10 ENCOUNTER — Ambulatory Visit (INDEPENDENT_AMBULATORY_CARE_PROVIDER_SITE_OTHER)
Admission: RE | Admit: 2017-08-10 | Discharge: 2017-08-10 | Disposition: A | Discharge status: Home / Self Care | Payer: Medicare Other | Source: Ambulatory Visit | Attending: Pulmonary Disease | Admitting: Pulmonary Disease

## 2017-08-10 ENCOUNTER — Encounter: Payer: Self-pay | Admitting: Adult Health

## 2017-08-10 DIAGNOSIS — R0609 Other forms of dyspnea: Secondary | ICD-10-CM | POA: Diagnosis not present

## 2017-08-10 DIAGNOSIS — J441 Chronic obstructive pulmonary disease with (acute) exacerbation: Secondary | ICD-10-CM

## 2017-08-10 DIAGNOSIS — J449 Chronic obstructive pulmonary disease, unspecified: Secondary | ICD-10-CM | POA: Diagnosis not present

## 2017-08-10 DIAGNOSIS — K220 Achalasia of cardia: Secondary | ICD-10-CM

## 2017-08-10 MED ORDER — PREDNISONE 10 MG PO TABS: ORAL_TABLET | ORAL | 0 refills | Status: DC

## 2017-08-10 NOTE — Assessment & Plan Note (Signed)
CXR shows clearance  Cont on current regimen

## 2017-08-10 NOTE — Assessment & Plan Note (Signed)
Slowly resolving flare  Slow pred taper   Plan  Patient Instructions  Prednisone 10mg  daily for 5 days then 5mg  daily for 5 days and stop.  Continue on Symbicort and Spiriva .  Continue on current regimen.  Aspiration precautions .  Follow up with  Dr. Lake Bells in 6-8 weeks  and As needed   Please contact office for sooner follow up if symptoms do not improve or worsen or seek emergency care

## 2017-08-10 NOTE — Assessment & Plan Note (Signed)
Swallow precautions

## 2017-08-10 NOTE — Patient Instructions (Addendum)
Prednisone 10mg  daily for 5 days then 5mg  daily for 5 days and stop.  Continue on Symbicort and Spiriva .  Continue on current regimen.  Aspiration precautions .  Follow up with  Dr. Lake Bells in 6-8 weeks  and As needed   Please contact office for sooner follow up if symptoms do not improve or worsen or seek emergency care

## 2017-08-10 NOTE — Progress Notes (Signed)
@Patient  ID: Antonio Ray, male    DOB: 1934/01/17, 84 y.o.   MRN: [id number]  Chief Complaint  Patient presents with  . Acute Visit    COPD     Referring provider: [name], MD  HPI: 84 year old male former smoker followed for very severe COPD complicated by recurrent pneumonia.   History of achalasia status post Botox injections in the distal esophagus late 2016  08/10/2017 acute office visit patient : COPD Presents for an acute office visit.  Patient complains of 1 week of congestion cough tightness and shortness of breath.  Patient was seen at urgent care last week.  Given a Z-Pak and a prednisone taper.  Patient says he is feeling better.  Chest x-ray today shows COPD.  Clear lungs. Still has some thick mucus at times.  Remains on Symbicort and Spiriva .    Patient was admitted in November for an atypical pneumonia chest x-ray showed increased bilateral markings.  He was treated with antibiotics. Got better.   Has chronic dysphagia . Has to eat very small portions. No vomiting .        Allergies  Allergen Reactions  . Codeine Other (See Comments)    Gets very angry, disoriented  . Dilaudid [Hydromorphone Hcl] Other (See Comments)    VERY AGITATED, HOSTILE  . Flomax [Tamsulosin Hcl] Shortness Of Breath  . Morphine And Related Other (See Comments)    VERY AGITATED, HOSTILE  . Sulfa Antibiotics Shortness Of Breath  . Beta Adrenergic Blockers Other (See Comments)    Disorientation  . Carvedilol Other (See Comments)    DISORIENTATION    Immunization History  Administered Date(s) Administered  . Influenza Split 05/09/2013, 06/16/2015  . Influenza, High Dose Seasonal PF 06/04/2014, 05/17/2017  . Influenza,inj,Quad PF,6+ Mos 04/11/2016  . Pneumococcal Polysaccharide-23 12/22/2008  . Tdap 12/22/2008    Past Medical History:  Diagnosis Date  . Achalasia   . AICD (automatic cardioverter/defibrillator) present 03/30/2011   Analyze ST study patient  .  Anginal pain ([place])   . BPH (benign prostatic hyperplasia)   . CHF (congestive heart failure) ([place])   . Chronic systolic dysfunction of left ventricle    EF 30-35%, CLASS II - III SYMPTOMS; intolerant to Coreg  . CKD (chronic kidney disease) stage 3, GFR 30-59 ml/min ([name]) 06/08/2012  . COPD (chronic obstructive pulmonary disease) ([place])   . Coronary artery disease    History of remote anterior MI with PCI to LAD in 2006; most recent cath 2007, no intervention required  . DOE (dyspnea on exertion)    with heavy exertion  . Dysrhythmia   . Esophageal dysmotility   . GERD (gastroesophageal reflux disease)   . GERD (gastroesophageal reflux disease)   . Head injury, closed, with concussion 2000ish  . Heart murmur    hx  . HOH (hard of hearing)   . Hyperlipidemia   . Memory loss    improved  . Myocardial infarction ([place]) 1994   ANTERIOR  . Pneumonia "several times"   aspiration pna with at least 3 admits for this 2016.   Marland Kitchen PVC's (premature ventricular contractions)   . Renal failure   . Skin cancer "several"   "forearms; head"  . Type II diabetes mellitus ([name])     Tobacco History: Social History   Tobacco Use  Smoking Status Former Smoker  . Packs/day: 1.00  . Years: 35.00  . Pack years: 35.00  . Types: Cigarettes  . Last attempt to quit:  08/01/1992  . Years since quitting: 25.0  Smokeless Tobacco Former Systems developer  . Quit date: 1994   Counseling given: Not Answered   Outpatient Encounter Medications as of 08/10/2017  Medication Sig  . albuterol (PROVENTIL HFA;VENTOLIN HFA) 108 (90 Base) MCG/ACT inhaler Inhale 2 puffs into the lungs every 6 (six) hours as needed for wheezing or shortness of breath.  Marland Kitchen aspirin 81 MG EC tablet Take 81 mg by mouth daily.    . Cholecalciferol (VITAMIN D) 2000 units tablet Take 2,000 Units by mouth daily.   Marland Kitchen donepezil (ARICEPT) 10 MG tablet Take 1 tablet (10 mg total) by mouth every morning.  . fenofibrate (TRICOR) 145 MG tablet TAKE 1 TABLET BY  MOUTH EVERY DAY (Patient taking differently: Take 145 mg by mouth once a day)  . finasteride (PROSCAR) 5 MG tablet Take 5 mg by mouth daily.  . furosemide (LASIX) 20 MG tablet Take 2 tablets (40 mg total) by mouth daily.  . insulin aspart (NOVOLOG FLEXPEN) 100 UNIT/ML FlexPen Inject 5-6 Units into the skin See admin instructions. (Patient taking differently: Inject 5-7 Units 3 (three) times daily with meals into the skin. )  . Insulin Glargine (BASAGLAR KWIKPEN) 100 UNIT/ML SOPN INJECT 16 UNITS UNDER THE SKIN EVERY MORNING  . loratadine (CLARITIN) 10 MG tablet Take 1 tablet (10 mg total) by mouth daily.  . memantine (NAMENDA) 10 MG tablet Take 1 tablet (10 mg total) by mouth 2 (two) times daily.  . Multiple Vitamins-Minerals (PRESERVISION AREDS 2) CAPS Take 1 capsule by mouth 2 (two) times daily.  . nitroGLYCERIN (NITRODUR - DOSED IN MG/24 HR) 0.2 mg/hr patch Place 1 patch (0.2 mg total) onto the skin daily. Alternates patch placement with odd/even days.  . pantoprazole (PROTONIX) 20 MG tablet TAKE 1 TABLET BY MOUTH DAILY  . pravastatin (PRAVACHOL) 40 MG tablet Take 1 tablet (40 mg total) by mouth at bedtime.  Marland Kitchen SPIRIVA HANDIHALER 18 MCG inhalation capsule INHALE CONTENTS OF 1 CAPSULE VIA HANDIHALER ONCE DAILY  . SYMBICORT 160-4.5 MCG/ACT inhaler INHALE 2 PUFFS BY MOUTH TWICE DAILY  . predniSONE (DELTASONE) 10 MG tablet 10mg  x's 5 days and 5 mg for 5 days then stop   No facility-administered encounter medications on file as of 08/10/2017.      Review of Systems  Constitutional:   No  weight loss, night sweats,  Fevers, chills,  +fatigue, or  lassitude.  HEENT:   No headaches,  Difficulty swallowing,  Tooth/dental problems, or  Sore throat,                No sneezing, itching, ear ache, nasal congestion, post nasal drip,   CV:  No chest pain,  Orthopnea, PND, swelling in lower extremities, anasarca, dizziness, palpitations, syncope.   GI  No  abdominal pain, nausea, vomiting, diarrhea,  change in bowel habits, loss of appetite, bloody stools.   Resp:  .  No chest wall deformity  Skin: no rash or lesions.  GU: no dysuria, change in color of urine, no urgency or frequency.  No flank pain, no hematuria   MS:  No joint pain or swelling.  No decreased range of motion.  No back pain.    Physical Exam  BP 114/60 (BP Location: Right Arm, Cuff Size: Normal)   Pulse 75   Ht 5\' 8"  (1.727 m)   Wt 160 lb 9.6 oz (72.8 kg)   SpO2 94%   BMI 24.42 kg/m   GEN: A/Ox3; pleasant , NAD, elderly  HEENT:  [place]/AT,  EACs-clear, TMs-wnl, NOSE-clear, THROAT-clear, no lesions, no postnasal drip or exudate noted.   NECK:  Supple w/ fair ROM; no JVD; normal carotid impulses w/o bruits; no thyromegaly or nodules palpated; no lymphadenopathy.    RESP  Few scattered rhonchi  ,,  no accessory muscle use, no dullness to percussion  CARD:  RRR, no m/r/g, tr  peripheral edema, pulses intact, no cyanosis or clubbing.  GI:   Soft & nt; nml bowel sounds; no organomegaly or masses detected.   Musco: Warm bil, no deformities or joint swelling noted.   Neuro: alert, no focal deficits noted.    Skin: Warm, no lesions or rashes    Lab Results:  CBC  BNP  Dg Chest 2 View  Result Date: 08/10/2017 CLINICAL DATA:  Dyspnea on exertion EXAM: CHEST  2 VIEW COMPARISON:  06/12/2017 FINDINGS: COPD with pulmonary hyperinflation. AICD unchanged with leads in the right atrium and right ventricle. Heart size normal. Negative for heart failure or pneumonia. Lungs are clear. IMPRESSION: COPD without acute abnormality. Electronically Signed   By: Franchot Gallo M.D.   On: 08/10/2017 14:58     Assessment & Plan:   COPD exacerbation ([place]) Slowly resolving flare  Slow pred taper   Plan  Patient Instructions  Prednisone 10mg  daily for 5 days then 5mg  daily for 5 days and stop.  Continue on Symbicort and Spiriva .  Continue on current regimen.  Aspiration precautions .  Follow up with  Dr. Lake Bells  in 6-8 weeks  and As needed   Please contact office for sooner follow up if symptoms do not improve or worsen or seek emergency care       Pneumonia CXR shows clearance  Cont on current regimen   Achalasia Swallow precautions      Rexene Edison, NP 08/10/2017

## 2017-08-10 NOTE — Progress Notes (Signed)
Antonio Ray Date of Birth: [birth year]  Medical Record #[id number]  History of Present Illness: Mr. Plucinski is seen for follow up CAD and CHF.  He is seen with his son. He has a history of coronary disease with remote myocardial infarction in 1994. He had emergent angioplasty of the LAD. Cardiac catheterization 2007 showed nonobstructive disease. He has an ischemic cardiomyopathy with ejection fraction of 30-35%. His last Myoview in 2011 showed an anterior wall scar without ischemia. He has an ICD in place.  Echo in September 2016 showed EF had dropped to 10-15% with global hypokinesis and akinesis of the anteroseptum.  He also has a history of recurrent aspiration PNA.   He had a barium swallow that showed achalasia. Apparently manography confirmed this. He has had esophageal dilation. He has a history of memory loss and is now on Aricept and Namenda. He was admitted 9/21-9/22/17 with acute COPD exacerbation treated with Nebs, steroids, and antibiotics. Echo showed EF 20-25%. No acute CHF. He was admitted in February 2018 with COPD  exacerbation and April 2018 with recurrent aspiration pneumonia. Followed by pulmonary and GI. Son reports swallowing evaluation was OK.  He was admitted in July 2018 with altered mental status and UTI. CT head was negative. Symptoms resolved with antibiotics. In November he was admitted with fever, altered mental status and atypical PNA. Last ICD check in October 2018 was normal.   On follow up today he states breathing is OK. Recent cough with ? Early bronchitis. On steroid taper now per pulmonary.  Does have SOB on exertion. Mild  cough. Notes chest pain at night when getting ready for bed. Relieved with belching.  Weight is down 10 lbs from November. Appetite limited due to esophageal problems. Supplementing diet with Ensure.   Current Outpatient Medications on File Prior to Visit  Medication Sig Dispense Refill  . albuterol (PROVENTIL HFA;VENTOLIN HFA) 108 (90  Base) MCG/ACT inhaler Inhale 2 puffs into the lungs every 6 (six) hours as needed for wheezing or shortness of breath. 1 Inhaler 2  . aspirin 81 MG EC tablet Take 81 mg by mouth daily.      . Cholecalciferol (VITAMIN D) 2000 units tablet Take 2,000 Units by mouth daily.     Marland Kitchen donepezil (ARICEPT) 10 MG tablet Take 1 tablet (10 mg total) by mouth every morning. 90 tablet 4  . fenofibrate (TRICOR) 145 MG tablet TAKE 1 TABLET BY MOUTH EVERY DAY (Patient taking differently: Take 145 mg by mouth once a day) 90 tablet 3  . finasteride (PROSCAR) 5 MG tablet Take 5 mg by mouth daily.  0  . furosemide (LASIX) 20 MG tablet Take 2 tablets (40 mg total) by mouth daily. 180 tablet 3  . insulin aspart (NOVOLOG FLEXPEN) 100 UNIT/ML FlexPen Inject 5-6 Units into the skin See admin instructions. (Patient taking differently: Inject 5-7 Units 3 (three) times daily with meals into the skin. ) 5 pen 11  . Insulin Glargine (BASAGLAR KWIKPEN) 100 UNIT/ML SOPN INJECT 16 UNITS UNDER THE SKIN EVERY MORNING 15 mL 11  . loratadine (CLARITIN) 10 MG tablet Take 1 tablet (10 mg total) by mouth daily. 30 tablet 1  . memantine (NAMENDA) 10 MG tablet Take 1 tablet (10 mg total) by mouth 2 (two) times daily. 180 tablet 4  . Multiple Vitamins-Minerals (PRESERVISION AREDS 2) CAPS Take 1 capsule by mouth 2 (two) times daily.    . nitroGLYCERIN (NITRODUR - DOSED IN MG/24 HR) 0.2 mg/hr patch Place 1  patch (0.2 mg total) onto the skin daily. Alternates patch placement with odd/even days. 30 patch 3  . pantoprazole (PROTONIX) 20 MG tablet TAKE 1 TABLET BY MOUTH DAILY 90 tablet 0  . pravastatin (PRAVACHOL) 40 MG tablet Take 1 tablet (40 mg total) by mouth at bedtime. 90 tablet 3  . predniSONE (DELTASONE) 10 MG tablet 27m x's 5 days and 5 mg for 5 days then stop 8 tablet 0  . SPIRIVA HANDIHALER 18 MCG inhalation capsule INHALE CONTENTS OF 1 CAPSULE VIA HANDIHALER ONCE DAILY 30 capsule 5  . SYMBICORT 160-4.5 MCG/ACT inhaler INHALE 2 PUFFS BY  MOUTH TWICE DAILY 10.2 g 4   No current facility-administered medications on file prior to visit.     Allergies  Allergen Reactions  . Codeine Other (See Comments)    Gets very angry, disoriented  . Dilaudid [Hydromorphone Hcl] Other (See Comments)    VERY AGITATED, HOSTILE  . Flomax [Tamsulosin Hcl] Shortness Of Breath  . Morphine And Related Other (See Comments)    VERY AGITATED, HOSTILE  . Sulfa Antibiotics Shortness Of Breath  . Beta Adrenergic Blockers Other (See Comments)    Disorientation  . Carvedilol Other (See Comments)    DISORIENTATION    Past Medical History:  Diagnosis Date  . Achalasia   . AICD (automatic cardioverter/defibrillator) present 03/30/2011   Analyze ST study patient  . [place]   . BPH (benign prostatic hyperplasia)   . CHF (congestive heart failure) ([place]   . Chronic systolic dysfunction of left ventricle    EF 30-35%, CLASS II - III SYMPTOMS; intolerant to Coreg  . CKD (chronic kidney disease) stage 3, GFR 30-59 ml/min ([name]) 06/08/2012  . COPD (chronic obstructive pulmonary disease) ([place]   . Coronary artery disease    History of remote anterior MI with PCI to LAD in 2006; most recent cath 2007, no intervention required  . DOE (dyspnea on exertion)    with heavy exertion  . Dysrhythmia   . Esophageal dysmotility   . GERD (gastroesophageal reflux disease)   . GERD (gastroesophageal reflux disease)   . Head injury, closed, with concussion 2000ish  . Heart murmur    hx  . HOH (hard of hearing)   . Hyperlipidemia   . Memory loss    improved  . [place]   ANTERIOR  . Pneumonia "several times"   aspiration pna with at least 3 admits for this 2016.   .Marland KitchenPVC's (premature ventricular contractions)   . Renal failure   . Skin cancer "several"   "forearms; head"  . Type II diabetes mellitus [place]     Past Surgical History:  Procedure Laterality Date  . BALLOON DILATION N/A 09/09/2015   Procedure: BALLOON  DILATION;  Surgeon: KMauri Pole MD;  [facility];  Service: Endoscopy;  Laterality: N/A;  rigiflex achalasia balloon dilators  . BOTOX INJECTION N/A 04/08/2015   Procedure: BOTOX INJECTION;  Surgeon: DMilus Banister MD;  [place] [facility]  Service: Endoscopy;  Laterality: N/A;  . CARDIAC CATHETERIZATION  01/11/2006   DEMONSTRATES AKINESIA OF THE DISTAL ANTERIOR WALL, DISTAL INFERIOR WALL AND AKINESIA OF THE APEX. THE BASAL SEGMENTS CONTRACT WELL AND OVERALL EF 35%  . CATARACT EXTRACTION W/ INTRAOCULAR LENS  IMPLANT, BILATERAL  08/2014-09/2014  . CHOLECYSTECTOMY  2/11  . CORONARY ANGIOPLASTY  1994   TO THE LAD  . ESOPHAGEAL MANOMETRY N/A 03/16/2015   Procedure: ESOPHAGEAL MANOMETRY (EM);  Surgeon: JJerene Bears MD;  Location: [facility];  Service: Gastroenterology;  Laterality: N/A;  . ESOPHAGOGASTRODUODENOSCOPY N/A 04/08/2015   Procedure: ESOPHAGOGASTRODUODENOSCOPY (EGD);  Surgeon: Milus Banister, MD;  Location: [facility];  Service: Endoscopy;  Laterality: N/A;  . ESOPHAGOGASTRODUODENOSCOPY (EGD) WITH PROPOFOL N/A 09/09/2015   Procedure: ESOPHAGOGASTRODUODENOSCOPY (EGD) WITH PROPOFOL;  Surgeon: Mauri Pole, MD;  Location: [facility];  Service: Endoscopy;  Laterality: N/A;  pt. is to have gastrografin xray post recovery-do not discharge until results are back  . EYE SURGERY    . FOOT FRACTURE SURGERY Right 1980's  . INSERT / REPLACE / REMOVE PACEMAKER    . SKIN CANCER EXCISION  "several"   "forearms, head"    Social History   Tobacco Use  Smoking Status Former Smoker  . Packs/day: 1.00  . Years: 35.00  . Pack years: 35.00  . Types: Cigarettes  . Last attempt to quit: 08/01/1992  . Years since quitting: 25.0  Smokeless Tobacco Former Systems developer  . Quit date: 41    Social History   Substance and Sexual Activity  Alcohol Use No  . Alcohol/week: 0.0 oz    Family History  Problem Relation Age of Onset  . Stroke Father        Mother history unknown -  never met her  . Colon cancer Neg Hx     Review of Systems: As noted in history of present illness. All other systems were reviewed and are negative.  Physical Exam: BP 103/60   Pulse 79   Ht 5' 8"  (1.727 m)   Wt 161 lb 9.6 oz (73.3 kg)   SpO2 94%   BMI 24.57 kg/m  GENERAL:  Well appearing elderly WM in NAD HEENT:  PERRL, EOMI, sclera are clear. Oropharynx is clear. NECK:  No jugular venous distention, carotid upstroke brisk and symmetric, no bruits, no thyromegaly or adenopathy LUNGS:  Crackles and wheeze in right base.  CHEST:  Unremarkable HEART:  RRR,  PMI not displaced or sustained,S1 and S2 within normal limits, no S3, no S4: no clicks, no rubs, no murmurs ABD:  Soft, nontender. BS +, no masses or bruits. No hepatomegaly, no splenomegaly EXT:  2 + pulses throughout, no edema, no cyanosis no clubbing SKIN:  Warm and dry.  No rashes NEURO:  Alert and oriented x 3. Cranial nerves II through XII intact. PSYCH:  Cognitively intact     LABORATORY DATA: Lab Results  Component Value Date   WBC 9.8 06/13/2017   HGB 12.1 (L) 06/13/2017   HCT 40.2 06/13/2017   PLT 240 06/13/2017   GLUCOSE 111 (H) 06/14/2017   CHOL 136 06/20/2013   TRIG 142.0 06/20/2013   HDL 32.00 (L) 06/20/2013   LDLCALC 76 06/20/2013   ALT 19 06/13/2017   AST 26 06/13/2017   NA 139 06/14/2017   K 3.8 06/14/2017   CL 105 06/14/2017   CREATININE 1.44 (H) 06/14/2017   BUN 27 (H) 06/14/2017   CO2 27 06/14/2017   INR 1.24 06/12/2017   HGBA1C 6.1 06/01/2017   Labs dated 02/05/16: cholesterol 144, triglycerides 223, HDL 40, LDL 59.  Echo: 04/22/16: Study Conclusions  - Left ventricle: The cavity size was normal. There was moderate   concentric hypertrophy. Systolic function was severely reduced.   The estimated ejection fraction was in the range of 20% to 25%.   Wall motion was normal; there were no regional wall motion   abnormalities. Doppler parameters are consistent with abnormal   left  ventricular relaxation (grade 1 diastolic dysfunction).  Doppler parameters are consistent with elevated ventricular   end-diastolic filling pressure. - Aortic valve: There was trivial regurgitation. - Aortic root: The aortic root was normal in size. - Mitral valve: Structurally normal valve. There was mild   regurgitation. - Left atrium: The atrium was mildly dilated. - Right ventricle: Pacer wire or catheter noted in right ventricle.   Systolic function was normal. - Right atrium: Pacer wire or catheter noted in right atrium. - Tricuspid valve: There was trivial regurgitation. - Pulmonic valve: There was no regurgitation. - Pulmonary arteries: Systolic pressure was within the normal   range. - Inferior vena cava: The vessel was normal in size. - Pericardium, extracardiac: There was no pericardial effusion.  Impressions:  - There is akinesis of the basal and mid anterior, anteroseptal,   inferoseptal walls, mid inferior wall and dyskinesis of the   apical anterior, septal and inferior walls including the true   septum. LVEF has slightly improved from the study on 04/02/2015,   now 20-25%, previously 10-15%.  Last ICD check January 2018 was normal.  Assessment / Plan: 1. Coronary disease with remote anterior myocardial infarction in 1994. Cardiac catheterization 2007 showed nonobstructive disease. His last stress test in October 2011 showed no ischemia. He does have some chest pain but it sounds more esophageal. We will continue with his current medical therapy including aspirin. He has a history of intolerance to carvedilol  because of dizziness. On nitrodur patch.  2. Ischemic cardiomyopathy with chronic systolic CHF. Last EF 20-25%. He is class 1-2. He appears to be euvolemic. Concerned about continued weight loss. Will reduce lasix to 20 mg daily and monitor weight and swelling. Po intake has decreased.  Note a candidate for beta blocker due to intolerance. Not a candidate for  ACEi/ARB due to CKD.   3. Prophylactic ICD. Stable in October.  4. Hyperlipidemia. Controlled. Check lipids whenever he has next blood draw.   5. Chronic kidney disease stage III.   6. Recurrent PNA- possibly related to aspiration. Followed by pulmonary  7. Achalasia. Per GI. S/p esophageal dilation.  8. COPD  Follow up in 6 months.

## 2017-08-14 NOTE — Progress Notes (Signed)
Reviewed, agree 

## 2017-08-15 ENCOUNTER — Ambulatory Visit (INDEPENDENT_AMBULATORY_CARE_PROVIDER_SITE_OTHER): Payer: Medicare Other | Admitting: Cardiology

## 2017-08-15 ENCOUNTER — Encounter: Payer: Self-pay | Admitting: Cardiology

## 2017-08-15 VITALS — BP 103/60 | HR 79 | Ht 68.0 in | Wt 161.6 lb

## 2017-08-15 DIAGNOSIS — Z95810 Presence of automatic (implantable) cardiac defibrillator: Secondary | ICD-10-CM

## 2017-08-15 DIAGNOSIS — I255 Ischemic cardiomyopathy: Secondary | ICD-10-CM | POA: Diagnosis not present

## 2017-08-15 DIAGNOSIS — Z9861 Coronary angioplasty status: Secondary | ICD-10-CM

## 2017-08-15 DIAGNOSIS — I495 Sick sinus syndrome: Secondary | ICD-10-CM | POA: Diagnosis not present

## 2017-08-15 DIAGNOSIS — I5022 Chronic systolic (congestive) heart failure: Secondary | ICD-10-CM | POA: Diagnosis not present

## 2017-08-15 DIAGNOSIS — I2510 Atherosclerotic heart disease of native coronary artery without angina pectoris: Secondary | ICD-10-CM

## 2017-08-15 NOTE — Patient Instructions (Signed)
Reduce lasix to 20 mg daily  Watch weight and for swelling

## 2017-08-19 ENCOUNTER — Emergency Department (HOSPITAL_COMMUNITY): Payer: Medicare Other

## 2017-08-19 ENCOUNTER — Inpatient Hospital Stay (HOSPITAL_COMMUNITY)
Admission: EM | Admit: 2017-08-19 | Discharge: 2017-08-22 | DRG: 194 | Disposition: A | Discharge status: Home / Self Care | Payer: Medicare Other | Attending: Student in an Organized Health Care Education/Training Program | Admitting: Student in an Organized Health Care Education/Training Program

## 2017-08-19 ENCOUNTER — Other Ambulatory Visit: Payer: Self-pay

## 2017-08-19 ENCOUNTER — Encounter (HOSPITAL_COMMUNITY): Payer: Self-pay | Admitting: Internal Medicine

## 2017-08-19 DIAGNOSIS — J181 Lobar pneumonia, unspecified organism: Secondary | ICD-10-CM

## 2017-08-19 DIAGNOSIS — N183 Chronic kidney disease, stage 3 unspecified: Secondary | ICD-10-CM | POA: Diagnosis present

## 2017-08-19 DIAGNOSIS — J1008 Influenza due to other identified influenza virus with other specified pneumonia: Principal | ICD-10-CM | POA: Diagnosis present

## 2017-08-19 DIAGNOSIS — J09X1 Influenza due to identified novel influenza A virus with pneumonia: Secondary | ICD-10-CM

## 2017-08-19 DIAGNOSIS — Z794 Long term (current) use of insulin: Secondary | ICD-10-CM

## 2017-08-19 DIAGNOSIS — R069 Unspecified abnormalities of breathing: Secondary | ICD-10-CM | POA: Diagnosis not present

## 2017-08-19 DIAGNOSIS — J440 Chronic obstructive pulmonary disease with acute lower respiratory infection: Secondary | ICD-10-CM | POA: Diagnosis present

## 2017-08-19 DIAGNOSIS — R000 Tachycardia, unspecified: Secondary | ICD-10-CM | POA: Diagnosis not present

## 2017-08-19 DIAGNOSIS — R05 Cough: Secondary | ICD-10-CM | POA: Diagnosis not present

## 2017-08-19 DIAGNOSIS — N400 Enlarged prostate without lower urinary tract symptoms: Secondary | ICD-10-CM | POA: Diagnosis present

## 2017-08-19 DIAGNOSIS — E1122 Type 2 diabetes mellitus with diabetic chronic kidney disease: Secondary | ICD-10-CM | POA: Diagnosis not present

## 2017-08-19 DIAGNOSIS — I959 Hypotension, unspecified: Secondary | ICD-10-CM | POA: Diagnosis present

## 2017-08-19 DIAGNOSIS — R0602 Shortness of breath: Secondary | ICD-10-CM | POA: Diagnosis not present

## 2017-08-19 DIAGNOSIS — Z885 Allergy status to narcotic agent status: Secondary | ICD-10-CM

## 2017-08-19 DIAGNOSIS — J449 Chronic obstructive pulmonary disease, unspecified: Secondary | ICD-10-CM | POA: Diagnosis present

## 2017-08-19 DIAGNOSIS — Z95810 Presence of automatic (implantable) cardiac defibrillator: Secondary | ICD-10-CM

## 2017-08-19 DIAGNOSIS — Z7982 Long term (current) use of aspirin: Secondary | ICD-10-CM

## 2017-08-19 DIAGNOSIS — I2510 Atherosclerotic heart disease of native coronary artery without angina pectoris: Secondary | ICD-10-CM | POA: Diagnosis not present

## 2017-08-19 DIAGNOSIS — Z882 Allergy status to sulfonamides status: Secondary | ICD-10-CM

## 2017-08-19 DIAGNOSIS — E785 Hyperlipidemia, unspecified: Secondary | ICD-10-CM | POA: Diagnosis present

## 2017-08-19 DIAGNOSIS — Z888 Allergy status to other drugs, medicaments and biological substances status: Secondary | ICD-10-CM

## 2017-08-19 DIAGNOSIS — K220 Achalasia of cardia: Secondary | ICD-10-CM | POA: Diagnosis present

## 2017-08-19 DIAGNOSIS — I252 Old myocardial infarction: Secondary | ICD-10-CM

## 2017-08-19 DIAGNOSIS — F0390 Unspecified dementia without behavioral disturbance: Secondary | ICD-10-CM | POA: Diagnosis present

## 2017-08-19 DIAGNOSIS — Z87891 Personal history of nicotine dependence: Secondary | ICD-10-CM

## 2017-08-19 DIAGNOSIS — Z7951 Long term (current) use of inhaled steroids: Secondary | ICD-10-CM

## 2017-08-19 DIAGNOSIS — I447 Left bundle-branch block, unspecified: Secondary | ICD-10-CM | POA: Diagnosis present

## 2017-08-19 DIAGNOSIS — Z87820 Personal history of traumatic brain injury: Secondary | ICD-10-CM

## 2017-08-19 DIAGNOSIS — Z79899 Other long term (current) drug therapy: Secondary | ICD-10-CM

## 2017-08-19 DIAGNOSIS — H9190 Unspecified hearing loss, unspecified ear: Secondary | ICD-10-CM | POA: Diagnosis present

## 2017-08-19 DIAGNOSIS — I5022 Chronic systolic (congestive) heart failure: Secondary | ICD-10-CM | POA: Diagnosis present

## 2017-08-19 DIAGNOSIS — B9689 Other specified bacterial agents as the cause of diseases classified elsewhere: Secondary | ICD-10-CM | POA: Diagnosis present

## 2017-08-19 DIAGNOSIS — J189 Pneumonia, unspecified organism: Secondary | ICD-10-CM | POA: Diagnosis present

## 2017-08-19 DIAGNOSIS — J1100 Influenza due to unidentified influenza virus with unspecified type of pneumonia: Secondary | ICD-10-CM

## 2017-08-19 DIAGNOSIS — K219 Gastro-esophageal reflux disease without esophagitis: Secondary | ICD-10-CM | POA: Diagnosis present

## 2017-08-19 LAB — URINALYSIS, ROUTINE W REFLEX MICROSCOPIC
BACTERIA UA: NONE SEEN
Bilirubin Urine: NEGATIVE
Glucose, UA: 50 mg/dL — AB
Hgb urine dipstick: NEGATIVE
KETONES UR: NEGATIVE mg/dL
LEUKOCYTES UA: NEGATIVE
Nitrite: NEGATIVE
PH: 6 (ref 5.0–8.0)
Protein, ur: NEGATIVE mg/dL
Specific Gravity, Urine: 1.018 (ref 1.005–1.030)
Squamous Epithelial / LPF: NONE SEEN

## 2017-08-19 LAB — COMPREHENSIVE METABOLIC PANEL
ALBUMIN: 2.9 g/dL — AB (ref 3.5–5.0)
ALT: 43 U/L (ref 17–63)
ANION GAP: 11 (ref 5–15)
AST: 45 U/L — ABNORMAL HIGH (ref 15–41)
Alkaline Phosphatase: 44 U/L (ref 38–126)
BUN: 39 mg/dL — ABNORMAL HIGH (ref 6–20)
CHLORIDE: 101 mmol/L (ref 101–111)
CO2: 28 mmol/L (ref 22–32)
Calcium: 9 mg/dL (ref 8.9–10.3)
Creatinine, Ser: 1.65 mg/dL — ABNORMAL HIGH (ref 0.61–1.24)
GFR calc Af Amer: 43 mL/min — ABNORMAL LOW (ref >60)
GFR calc non Af Amer: 37 mL/min — ABNORMAL LOW (ref >60)
GLUCOSE: 151 mg/dL — AB (ref 65–99)
Potassium: 4.1 mmol/L (ref 3.5–5.1)
SODIUM: 140 mmol/L (ref 135–145)
Total Bilirubin: 1 mg/dL (ref 0.3–1.2)
Total Protein: 6.3 g/dL — ABNORMAL LOW (ref 6.5–8.1)

## 2017-08-19 LAB — INFLUENZA PANEL BY PCR (TYPE A & B)
INFLBPCR: NEGATIVE
Influenza A By PCR: POSITIVE — AB

## 2017-08-19 LAB — GLUCOSE, CAPILLARY
GLUCOSE-CAPILLARY: 105 mg/dL — AB (ref 65–99)
Glucose-Capillary: 152 mg/dL — ABNORMAL HIGH (ref 65–99)

## 2017-08-19 LAB — CBC WITH DIFFERENTIAL/PLATELET
BASOS ABS: 0 10*3/uL (ref 0.0–0.1)
Basophils Relative: 0 %
Eosinophils Absolute: 0 10*3/uL (ref 0.0–0.7)
Eosinophils Relative: 0 %
HEMATOCRIT: 43.8 % (ref 39.0–52.0)
Hemoglobin: 13.8 g/dL (ref 13.0–17.0)
Lymphocytes Relative: 5 %
Lymphs Abs: 1.2 10*3/uL (ref 0.7–4.0)
MCH: 27.9 pg (ref 26.0–34.0)
MCHC: 31.5 g/dL (ref 30.0–36.0)
MCV: 88.5 fL (ref 78.0–100.0)
MONO ABS: 1 10*3/uL (ref 0.1–1.0)
Monocytes Relative: 4 %
NEUTROS ABS: 20.3 10*3/uL — AB (ref 1.7–7.7)
Neutrophils Relative %: 91 %
Platelets: 239 10*3/uL (ref 150–400)
RBC: 4.95 MIL/uL (ref 4.22–5.81)
RDW: 15.8 % — AB (ref 11.5–15.5)
WBC: 22.4 10*3/uL — ABNORMAL HIGH (ref 4.0–10.5)

## 2017-08-19 LAB — I-STAT CG4 LACTIC ACID, ED
Lactic Acid, Venous: 1.82 mmol/L (ref 0.5–1.9)
Lactic Acid, Venous: 1.61 mmol/L (ref 0.5–1.9)

## 2017-08-19 MED ORDER — MEMANTINE HCL 10 MG PO TABS
10.0000 mg | ORAL_TABLET | Freq: Two times a day (BID) | ORAL | Status: DC
  Administered 2017-08-19 – 2017-08-22 (×6): 10 mg via ORAL
  Filled 2017-08-19 (×9): qty 1

## 2017-08-19 MED ORDER — FINASTERIDE 5 MG PO TABS
5.0000 mg | ORAL_TABLET | Freq: Every day | ORAL | Status: DC
  Administered 2017-08-20 – 2017-08-22 (×3): 5 mg via ORAL
  Filled 2017-08-19 (×3): qty 1

## 2017-08-19 MED ORDER — DOXYCYCLINE HYCLATE 100 MG IV SOLR
100.0000 mg | Freq: Once | INTRAVENOUS | Status: AC
  Administered 2017-08-19: 100 mg via INTRAVENOUS
  Filled 2017-08-19: qty 100

## 2017-08-19 MED ORDER — ACETAMINOPHEN 650 MG RE SUPP: 650.0000 mg | Freq: Four times a day (QID) | RECTAL | Status: DC | PRN

## 2017-08-19 MED ORDER — MOMETASONE FURO-FORMOTEROL FUM 200-5 MCG/ACT IN AERO
2.0000 | INHALATION_SPRAY | Freq: Two times a day (BID) | RESPIRATORY_TRACT | Status: DC
  Administered 2017-08-19 – 2017-08-22 (×6): 2 via RESPIRATORY_TRACT
  Filled 2017-08-19: qty 8.8

## 2017-08-19 MED ORDER — SODIUM CHLORIDE 0.9 % IV BOLUS (SEPSIS): 500.0000 mL | Freq: Once | INTRAVENOUS | Status: DC

## 2017-08-19 MED ORDER — ACETAMINOPHEN 325 MG PO TABS
650.0000 mg | ORAL_TABLET | Freq: Four times a day (QID) | ORAL | Status: DC | PRN
  Administered 2017-08-20 – 2017-08-21 (×3): 650 mg via ORAL
  Filled 2017-08-19 (×3): qty 2

## 2017-08-19 MED ORDER — DEXTROSE 5 % IV SOLN
1.0000 g | INTRAVENOUS | Status: DC
  Administered 2017-08-19 – 2017-08-22 (×4): 1 g via INTRAVENOUS
  Filled 2017-08-19 (×4): qty 10

## 2017-08-19 MED ORDER — INSULIN GLARGINE 100 UNIT/ML ~~LOC~~ SOLN
12.0000 [IU] | SUBCUTANEOUS | Status: DC
  Administered 2017-08-20 – 2017-08-22 (×3): 12 [IU] via SUBCUTANEOUS
  Filled 2017-08-19 (×3): qty 0.12

## 2017-08-19 MED ORDER — TIOTROPIUM BROMIDE MONOHYDRATE 18 MCG IN CAPS
18.0000 ug | ORAL_CAPSULE | Freq: Every day | RESPIRATORY_TRACT | Status: DC
  Administered 2017-08-20 – 2017-08-22 (×3): 18 ug via RESPIRATORY_TRACT
  Filled 2017-08-19: qty 5

## 2017-08-19 MED ORDER — HEPARIN SODIUM (PORCINE) 5000 UNIT/ML IJ SOLN
5000.0000 [IU] | Freq: Three times a day (TID) | INTRAMUSCULAR | Status: DC
  Administered 2017-08-19 – 2017-08-22 (×10): 5000 [IU] via SUBCUTANEOUS
  Filled 2017-08-19 (×10): qty 1

## 2017-08-19 MED ORDER — DONEPEZIL HCL 10 MG PO TABS
10.0000 mg | ORAL_TABLET | Freq: Every morning | ORAL | Status: DC
  Administered 2017-08-20 – 2017-08-22 (×3): 10 mg via ORAL
  Filled 2017-08-19 (×3): qty 1

## 2017-08-19 MED ORDER — ASPIRIN 81 MG PO CHEW
81.0000 mg | CHEWABLE_TABLET | Freq: Every day | ORAL | Status: DC
  Administered 2017-08-20 – 2017-08-22 (×3): 81 mg via ORAL
  Filled 2017-08-19 (×3): qty 1

## 2017-08-19 MED ORDER — LORATADINE 10 MG PO TABS
10.0000 mg | ORAL_TABLET | Freq: Every day | ORAL | Status: DC
  Administered 2017-08-20 – 2017-08-22 (×3): 10 mg via ORAL
  Filled 2017-08-19 (×3): qty 1

## 2017-08-19 MED ORDER — OSELTAMIVIR PHOSPHATE 30 MG PO CAPS
30.0000 mg | ORAL_CAPSULE | Freq: Two times a day (BID) | ORAL | Status: DC
  Administered 2017-08-19 – 2017-08-22 (×7): 30 mg via ORAL
  Filled 2017-08-19 (×10): qty 1

## 2017-08-19 MED ORDER — ACETAMINOPHEN 325 MG PO TABS
650.0000 mg | ORAL_TABLET | Freq: Once | ORAL | Status: AC
Start: 2017-08-19 — End: 2017-08-19
  Administered 2017-08-19: 650 mg via ORAL
  Filled 2017-08-19: qty 2

## 2017-08-19 MED ORDER — SODIUM CHLORIDE 0.9 % IV BOLUS (SEPSIS)
500.0000 mL | Freq: Once | INTRAVENOUS | Status: AC
  Administered 2017-08-19: 500 mL via INTRAVENOUS

## 2017-08-19 MED ORDER — INSULIN ASPART 100 UNIT/ML ~~LOC~~ SOLN
0.0000 [IU] | Freq: Three times a day (TID) | SUBCUTANEOUS | Status: DC
  Administered 2017-08-19 – 2017-08-20 (×3): 3 [IU] via SUBCUTANEOUS
  Administered 2017-08-21: 2 [IU] via SUBCUTANEOUS
  Administered 2017-08-21: 3 [IU] via SUBCUTANEOUS

## 2017-08-19 MED ORDER — SENNOSIDES-DOCUSATE SODIUM 8.6-50 MG PO TABS: 1.0000 | ORAL_TABLET | Freq: Every evening | ORAL | Status: DC | PRN

## 2017-08-19 MED ORDER — ENSURE ENLIVE PO LIQD
237.0000 mL | Freq: Two times a day (BID) | ORAL | Status: DC
  Administered 2017-08-20 – 2017-08-22 (×5): 237 mL via ORAL

## 2017-08-19 MED ORDER — PANTOPRAZOLE SODIUM 20 MG PO TBEC
20.0000 mg | DELAYED_RELEASE_TABLET | Freq: Every day | ORAL | Status: DC
  Administered 2017-08-20 – 2017-08-22 (×3): 20 mg via ORAL
  Filled 2017-08-19 (×4): qty 1

## 2017-08-19 NOTE — ED Provider Notes (Signed)
[facility]  SURGICAL Provider Note   CSN: [id number] Arrival date & time: 08/19/17  1027     History   Chief Complaint Chief Complaint  Patient presents with  . Fever  . Shortness of Breath    HPI Antonio Ray is a 85 y.o. male.  HPI 84 year old male with multiple comorbidities including chronic systolic CHF, COPD, and diabetes presents with fever.  He has been having a cough for couple weeks and has been on azithromycin, 5-day steroid burst, and 10-day steroid.  Has been following up with his PCP and pulmonologist during this time.  However this morning he woke up and had chills.  Found to have a temperature of 100.2 here.  Not hypoxic but was short of breath and given a breathing treatment by EMS.  Family is concerned for acute pneumonia.  Past Medical History:  Diagnosis Date  . Achalasia   . AICD (automatic cardioverter/defibrillator) present 03/30/2011   Analyze ST study patient  . Anginal pain ([place])   . BPH (benign prostatic hyperplasia)   . CHF (congestive heart failure) ([place])   . Chronic systolic dysfunction of left ventricle    EF 30-35%, CLASS II - III SYMPTOMS; intolerant to Coreg  . CKD (chronic kidney disease) stage 3, GFR 30-59 ml/min ([name]) 06/08/2012  . COPD (chronic obstructive pulmonary disease) ([place])   . Coronary artery disease    History of remote anterior MI with PCI to LAD in 2006; most recent cath 2007, no intervention required  . DOE (dyspnea on exertion)    with heavy exertion  . Dysrhythmia   . Esophageal dysmotility   . GERD (gastroesophageal reflux disease)   . GERD (gastroesophageal reflux disease)   . Head injury, closed, with concussion 2000ish  . Heart murmur    hx  . HOH (hard of hearing)   . Hyperlipidemia   . Memory loss    improved  . Myocardial infarction ([place]) 1994   ANTERIOR  . Pneumonia "several times"   aspiration pna with at least 3 admits for this 2016.   Marland Kitchen PVC's (premature ventricular  contractions)   . Renal failure   . Skin cancer "several"   "forearms; head"  . Type II diabetes mellitus [facility])     Patient Active Problem List   Diagnosis Date Noted  . CAP (community acquired pneumonia) 08/19/2017  . Type II diabetes mellitus ([place])   . Skin cancer   . Renal failure   . HOH (hard of hearing)   . Heart murmur   . Head injury, closed, with concussion   . Esophageal dysmotility   . Dysrhythmia   . DOE (dyspnea on exertion)   . Chronic systolic dysfunction of left ventricle   . CHF (congestive heart failure) ([place])   . Anginal pain ([place])   . Pneumonia 06/12/2017  . Acute UTI   . Altered mental state 02/15/2017  . Achalasia of esophagus   . Encephalopathy   . Fever chills   . SOB (shortness of breath)   . Acute respiratory failure with hypoxemia ([place])   . Acute respiratory failure ([place]) 04/21/2016  . Chronic combined systolic and diastolic congestive heart failure ([place]) 04/21/2016  . Memory loss 04/21/2016  . Allergic rhinitis 04/11/2016  . Chronic systolic CHF (congestive heart failure) ([place]) 10/30/2015  . Mild cognitive impairment 08/18/2015  . Difficulty in swallowing   . AKI (acute kidney injury) ([place])   . Type 2 diabetes mellitus with stage 3 chronic  kidney disease, with long-term current use of insulin ([place])   . Chronic systolic congestive heart failure ([place])   . Chronic kidney disease, stage III (moderate) ([name])   . Other specified hypotension   . CKD (chronic kidney disease), stage III ([place])   . Other emphysema ([place])   . Systolic CHF, acute on chronic ([name])   . AICD (automatic cardioverter/defibrillator) present   . CAD S/P percutaneous coronary angioplasty   . Acute respiratory failure with hypoxia ([place]) 04/03/2015  . Cardiomyopathy, ischemic 04/03/2015  . Elevated troponin 04/03/2015  . Sepsis ([place]) 03/31/2015  . COPD exacerbation ([place])   . Aspiration into airway 03/21/2015  . Achalasia 03/21/2015  . Dysphagia   . Esophageal dysphagia   .  Esophageal dysmotilities   . COPD with acute exacerbation ([place]) 01/14/2015  . Left ventricular apical thrombus 01/14/2015  . Acute on chronic systolic CHF (congestive heart failure) ([place]) 01/14/2015  . ARF (acute renal failure) ([place]) 01/14/2015  . Dysphagia, unspecified(787.20) 10/30/2013  . COPD GOLD IV 10/07/2013  . Leukocytosis 09/11/2013  . Acute encephalopathy 09/10/2013  . Renal failure (ARF), acute on chronic ([name]) 09/10/2013  . Automatic implantable cardioverter-defibrillator in situ 07/06/2012  . CKD (chronic kidney disease) stage 3, GFR 30-59 ml/min ([name]) 06/08/2012  . GERD (gastroesophageal reflux disease) 09/26/2011  . BPH (benign prostatic hyperplasia) 08/22/2011  . COPD (chronic obstructive pulmonary disease) ([place]) 07/17/2011  . S/P ICD (internal cardiac defibrillator) procedure 04/20/2011  . Coronary artery disease   . Sick sinus syndrome ([place])   . Acute on chronic combined systolic and diastolic congestive heart failure, NYHA class 3 ([place])   . Hyperlipidemia   . PVC's (premature ventricular contractions)   . Myocardial infarction [facility]) 08/01/1992    Past Surgical History:  Procedure Laterality Date  . BALLOON DILATION N/A 09/09/2015   Procedure: BALLOON DILATION;  Surgeon: Mauri Pole, MD;  Location: [place] [facility];  Service: Endoscopy;  Laterality: N/A;  rigiflex achalasia balloon dilators  . BOTOX INJECTION N/A 04/08/2015   Procedure: BOTOX INJECTION;  Surgeon: Milus Banister, MD;  Location: [facility];  Service: Endoscopy;  Laterality: N/A;  . CARDIAC CATHETERIZATION  01/11/2006   DEMONSTRATES AKINESIA OF THE DISTAL ANTERIOR WALL, DISTAL INFERIOR WALL AND AKINESIA OF THE APEX. THE BASAL SEGMENTS CONTRACT WELL AND OVERALL EF 35%  . CATARACT EXTRACTION W/ INTRAOCULAR LENS  IMPLANT, BILATERAL  08/2014-09/2014  . CHOLECYSTECTOMY  2/11  . CORONARY ANGIOPLASTY  1994   TO THE LAD  . ESOPHAGEAL MANOMETRY N/A 03/16/2015   Procedure: ESOPHAGEAL MANOMETRY (EM);  Surgeon:  Jerene Bears, MD;  Location: [facility];  Service: Gastroenterology;  Laterality: N/A;  . ESOPHAGOGASTRODUODENOSCOPY N/A 04/08/2015   Procedure: ESOPHAGOGASTRODUODENOSCOPY (EGD);  Surgeon: Milus Banister, MD;  Location: [facility];  Service: Endoscopy;  Laterality: N/A;  . ESOPHAGOGASTRODUODENOSCOPY (EGD) WITH PROPOFOL N/A 09/09/2015   Procedure: ESOPHAGOGASTRODUODENOSCOPY (EGD) WITH PROPOFOL;  Surgeon: Mauri Pole, MD;  Location: [place] [facility] [facility];  Service: Endoscopy;  Laterality: N/A;  pt. is to have gastrografin xray post recovery-do not discharge until results are back  . EYE SURGERY    . FOOT FRACTURE SURGERY Right 1980's  . INSERT / REPLACE / REMOVE PACEMAKER    . SKIN CANCER EXCISION  "several"   "forearms, head"       Home Medications    Prior to Admission medications   Medication Sig Start Date End Date Taking? Authorizing Provider  aspirin 81 MG EC tablet Take 81 mg by mouth daily.  Yes [provider]  Cholecalciferol (VITAMIN D) 2000 units tablet Take 2,000 Units by mouth daily.    Yes [provider]  donepezil (ARICEPT) 10 MG tablet Take 1 tablet (10 mg total) by mouth every morning. 05/01/17  Yes Marcial Pacas, MD  fenofibrate (TRICOR) 145 MG tablet TAKE 1 TABLET BY MOUTH EVERY DAY Patient taking differently: Take 145 mg by mouth once a day 02/10/17  Yes [place], Peter M, MD  finasteride (PROSCAR) 5 MG tablet Take 5 mg by mouth daily. 05/19/17  Yes [provider]  furosemide (LASIX) 20 MG tablet Take 2 tablets (40 mg total) by mouth daily. Patient taking differently: Take 20 mg by mouth daily.  11/22/16  Yes [place], Peter M, MD  insulin aspart (NOVOLOG FLEXPEN) 100 UNIT/ML FlexPen Inject 5-6 Units into the skin See admin instructions. Patient taking differently: Inject 0-6 Units into the skin 3 (three) times daily as needed for high blood sugar.  06/01/17  Yes Philemon Kingdom, MD  Insulin Glargine (BASAGLAR KWIKPEN) 100 UNIT/ML SOPN INJECT 16  UNITS UNDER THE SKIN EVERY MORNING Patient taking differently: Inject 12 Units into the skin every morning.  06/01/17  Yes Philemon Kingdom, MD  loratadine (CLARITIN) 10 MG tablet Take 1 tablet (10 mg total) by mouth daily. 07/02/15  Yes Barton Dubois, MD  memantine (NAMENDA) 10 MG tablet Take 1 tablet (10 mg total) by mouth 2 (two) times daily. 05/01/17  Yes Marcial Pacas, MD  Multiple Vitamins-Minerals (PRESERVISION AREDS 2) CAPS Take 1 capsule by mouth 2 (two) times daily.   Yes [provider]  nitroGLYCERIN (NITRODUR - DOSED IN MG/24 HR) 0.2 mg/hr patch Place 1 patch (0.2 mg total) onto the skin daily. Alternates patch placement with odd/even days. 07/18/17  Yes [place], Peter M, MD  pantoprazole (PROTONIX) 20 MG tablet TAKE 1 TABLET BY MOUTH DAILY 05/26/17  Yes Nandigam, Venia Minks, MD  pravastatin (PRAVACHOL) 40 MG tablet Take 1 tablet (40 mg total) by mouth at bedtime. 05/26/17  Yes [place], Peter M, MD  predniSONE (DELTASONE) 10 MG tablet 28m x's 5 days and 5 mg for 5 days then stop 08/10/17  Yes Parrett, Tammy S, NP  SPIRIVA HANDIHALER 18 MCG inhalation capsule INHALE CONTENTS OF 1 CAPSULE VIA HANDIHALER ONCE DAILY 06/08/17  Yes MJuanito Doom MD  SYMBICORT 160-4.5 MCG/ACT inhaler INHALE 2 PUFFS BY MOUTH TWICE DAILY 07/31/17  Yes MJuanito Doom MD  albuterol (PROVENTIL HFA;VENTOLIN HFA) 108 (90 Base) MCG/ACT inhaler Inhale 2 puffs into the lungs every 6 (six) hours as needed for wheezing or shortness of breath. 04/22/16   TEugenie Filler MD    Family History Family History  Problem Relation Age of Onset  . Stroke Father        Mother history unknown - never met her  . Colon cancer Neg Hx     Social History Social History   Tobacco Use  . Smoking status: Former Smoker    Packs/day: 1.00    Years: 35.00    Pack years: 35.00    Types: Cigarettes    Last attempt to quit: 08/01/1992    Years since quitting: 25.0  . Smokeless tobacco: Former [employer] developer   Quit date: 1994    Substance Use Topics  . Alcohol use: No    Alcohol/week: 0.0 oz  . Drug use: No     Allergies   Codeine; Dilaudid [hydromorphone hcl]; Flomax [tamsulosin hcl]; Morphine and related; Sulfa antibiotics; Beta adrenergic blockers; and Carvedilol  Review of Systems Review of Systems  Constitutional: Positive for chills. Negative for fever.  Respiratory: Positive for cough and shortness of breath.   Cardiovascular: Negative for chest pain.  Gastrointestinal: Negative for vomiting.  Neurological: Negative for headaches.  All other systems reviewed and are negative.    Physical Exam Updated Vital Signs BP (!) 101/43 (BP Location: Right Arm)   Pulse 77   Temp 98.4 F (36.9 C) (Oral)   Resp (!) 27   Ht 5' 8"  (1.727 m)   Wt 73 kg (161 lb)   SpO2 99%   BMI 24.48 kg/m   Physical Exam  Constitutional: He is oriented to person, place, and time. He appears well-developed and well-nourished.  Non-toxic appearance. He does not appear ill. No distress. He is not intubated.  HENT:  Head: Normocephalic and atraumatic.  Right Ear: External ear normal.  Left Ear: External ear normal.  Nose: Nose normal.  Eyes: Right eye exhibits no discharge. Left eye exhibits no discharge.  Neck: Neck supple.  Cardiovascular: Normal rate, regular rhythm and normal heart sounds.  Pulmonary/Chest: Effort normal. No accessory muscle usage or stridor. Tachypnea noted. He is not intubated. No respiratory distress. He has decreased breath sounds in the right lower field and the left lower field.  Abdominal: Soft. There is no tenderness.  Musculoskeletal: He exhibits no edema.       Right lower leg: He exhibits no edema.       Left lower leg: He exhibits no edema.  Neurological: He is alert and oriented to person, place, and time.  Skin: Skin is warm and dry.  Nursing note and vitals reviewed.    ED Treatments / Results  Labs (all labs ordered are listed, but only abnormal results are displayed) Labs  Reviewed  COMPREHENSIVE METABOLIC PANEL - Abnormal; Notable for the following components:      Result Value   Glucose, Bld 151 (*)    BUN 39 (*)    Creatinine, Ser 1.65 (*)    Total Protein 6.3 (*)    Albumin 2.9 (*)    AST 45 (*)    GFR calc non Af Amer 37 (*)    GFR calc Af Amer 43 (*)    All other components within normal limits  CBC WITH DIFFERENTIAL/PLATELET - Abnormal; Notable for the following components:   WBC 22.4 (*)    RDW 15.8 (*)    Neutro Abs 20.3 (*)    All other components within normal limits  INFLUENZA PANEL BY PCR (TYPE A & B) - Abnormal; Notable for the following components:   Influenza A By PCR POSITIVE (*)    All other components within normal limits  CULTURE, BLOOD (ROUTINE X 2)  CULTURE, BLOOD (ROUTINE X 2)  CULTURE, EXPECTORATED SPUTUM-ASSESSMENT  URINALYSIS, ROUTINE W REFLEX MICROSCOPIC  I-STAT CG4 LACTIC ACID, ED  I-STAT CG4 LACTIC ACID, ED    EKG  EKG Interpretation  Date/Time:  Saturday August 19 2017 08:20:01 EST Ventricular Rate:  123 PR Interval:    QRS Duration: 149 QT Interval:  332 QTC Calculation: 475 R Axis:   -71 Text Interpretation:  Sinus tachycardia Left bundle branch block Baseline wander in lead(s) V6 no significant change since July 2018 Confirmed by Sherwood Gambler [phone]) on 08/19/2017 11:26:14 AM       Radiology Dg Chest Portable 1 View  Result Date: 08/19/2017 CLINICAL DATA:  Shortness of breath, CHF, chronic systolic dysfunction EXAM: PORTABLE CHEST 1 VIEW COMPARISON:  08/10/2017 FINDINGS: Stable  2 lead left subclavian pacemaker. Normal heart size and vascularity. Negative for edema, significant effusion or pneumothorax. New ill-defined nodular opacities in the right lower lung compared to 08/10/2017. Difficult to exclude developing right lower lung pneumonia. Trachea is midline. Atherosclerosis noted of the aorta. Degenerative changes of the spine. IMPRESSION: Developing ill-defined right lower lung nodular opacities  compared to 9 days ago, difficult to exclude developing pneumonia. Thoracic aortic atherosclerosis Electronically Signed   By: Jerilynn Mages.  Shick M.D.   On: 08/19/2017 08:59    Procedures Procedures (including critical care time)  Medications Ordered in ED Medications  aspirin chewable tablet 81 mg (not administered)  donepezil (ARICEPT) tablet 10 mg (not administered)  finasteride (PROSCAR) tablet 5 mg (not administered)  loratadine (CLARITIN) tablet 10 mg (not administered)  mometasone-formoterol (DULERA) 200-5 MCG/ACT inhaler 2 puff (not administered)  tiotropium (SPIRIVA) inhalation capsule 18 mcg (not administered)  pantoprazole (PROTONIX) EC tablet 20 mg (not administered)  memantine (NAMENDA) tablet 10 mg (not administered)  heparin injection 5,000 Units (not administered)  acetaminophen (TYLENOL) tablet 650 mg (not administered)    Or  acetaminophen (TYLENOL) suppository 650 mg (not administered)  senna-docusate (Senokot-S) tablet 1 tablet (not administered)  cefTRIAXone (ROCEPHIN) 1 g in dextrose 5 % 50 mL IVPB (1 g Intravenous New Bag/Given 08/19/17 1621)  insulin glargine (LANTUS) injection 12 Units (not administered)  insulin aspart (novoLOG) injection 0-15 Units (not administered)  oseltamivir (TAMIFLU) capsule 30 mg (not administered)  acetaminophen (TYLENOL) tablet 650 mg (650 mg Oral Given 08/19/17 0901)  doxycycline (VIBRAMYCIN) 100 mg in dextrose 5 % 250 mL IVPB (0 mg Intravenous Stopped 08/19/17 1154)  sodium chloride 0.9 % bolus 500 mL (0 mLs Intravenous Stopped 08/19/17 1154)     Initial Impression / Assessment and Plan / ED Course  I have reviewed the triage vital signs and the nursing notes.  Pertinent labs & imaging results that were available during my care of the patient were reviewed by me and considered in my medical decision making (see chart for details).     Patient is tachypneic but no distress.  Workup concerning for right-sided pneumonia.  Given he already  had azithromycin within the last couple weeks, and is still community-acquired pneumonia, I will give him IV doxycycline.  White count shows large increase, likely from pneumonia but also steroid use.  Renal function essentially similar.  He had some transiently low blood pressures but I do not think these were real readings as his blood pressure came up after reapplying the blood pressure cuff.  While in the ED he was found to be flu positive, started on Tamiflu.  He is maintaining his oxygenation and has no respiratory distress or failure.  Admit to the hospital.  Final Clinical Impressions(s) / ED Diagnoses   Final diagnoses:  Community acquired pneumonia of right lower lobe of lung ([place])  Influenza with pneumonia    ED Discharge Orders    None       Sherwood Gambler, MD 08/19/17 1643

## 2017-08-19 NOTE — Consult Note (Addendum)
[facility] Pulmonary Diseases & Critical Care Medicine Initial Pulmonary/Critical Care Consultation  Patient Name: Antonio Ray MRN: [id number] DOB: 1934-08-01    ADMISSION DATE:  08/19/2017 CONSULTATION DATE:  08/19/2017  REFERRING MD:  [facility]ad  REASON FOR CONSULTATION:  COPD   HISTORY OF PRESENT ILLNESS  This 84 y.o. Caucasian male is seen in consultation at the request of Dr. Aldine Contes for recommendations on further evaluation and management of COPD. He sees Dr. Lake Bells for COPD. The patient was brought to the ER by his daughter, who was concerned about accelerating deterioration in his status, most notably, confusion and chills. The daughter recalls that the patient called her just after 0700 this morning to give his daughter a wake-up call, per their routine. However, later in the morning, she started to get concerned when he answered inappropriately to a question during conversation. And, he proceeded to complain about feeling cold, prompting decision to come to the ER.  The patient has been struggling with cough, wheezing and exertional dyspnea for several days... almost a couple of weeks. He has been to an urgent care clinic, where he apparently was given a prescription for a Z-Pak. He was also seen in Dr. Anastasia Pall office, where he had low-dose prednisone added to his regimen of Symbicort/Spiriva for COPD. The patient and daughter report compliance to prescribed therapy, which is also suggested by their frustration with blood sugar control during this time. However, it is also noteworthy that the patient had noticed increasing difficulty with blood glucose control PRIOR to receiving antibiotics or steroids.  Cough with modest sputum production. Equivocal increase in exertional dyspnea. No chest pain. No pleuritic pain. No recent trauma. No hemoptysis. No fever. No nausea or vomiting. No diarrhea. No dysuria.   REVIEW OF SYSTEMS: As highlighted above in the HPI. Otherwise,  the remainder of the balance of a review of 13 systems is negative.   PAST MEDICAL/SURGICAL/SOCIAL/FAMILY HISTORIES   Past Medical History:  Diagnosis Date  . Achalasia   . AICD (automatic cardioverter/defibrillator) present 03/30/2011   Analyze ST study patient  . Anginal pain ([place])   . BPH (benign prostatic hyperplasia)   . CHF (congestive heart failure) ([place])   . Chronic systolic dysfunction of left ventricle    EF 30-35%, CLASS II - III SYMPTOMS; intolerant to Coreg  . CKD (chronic kidney disease) stage 3, GFR 30-59 ml/min ([name]) 06/08/2012  . COPD (chronic obstructive pulmonary disease) ([place])   . Coronary artery disease    History of remote anterior MI with PCI to LAD in 2006; most recent cath 2007, no intervention required  . DOE (dyspnea on exertion)    with heavy exertion  . Dysrhythmia   . Esophageal dysmotility   . GERD (gastroesophageal reflux disease)   . GERD (gastroesophageal reflux disease)   . Head injury, closed, with concussion 2000ish  . Heart murmur    hx  . HOH (hard of hearing)   . Hyperlipidemia   . Memory loss    improved  . Myocardial infarction ([place]) 1994   ANTERIOR  . Pneumonia "several times"   aspiration pna with at least 3 admits for this 2016.   Marland Kitchen PVC's (premature ventricular contractions)   . Renal failure   . Skin cancer "several"   "forearms; head"  . Type II diabetes mellitus ([place])     Past Surgical History:  Procedure Laterality Date  . BALLOON DILATION N/A 09/09/2015   Procedure: BALLOON DILATION;  Surgeon: Mauri Pole, MD;  Location:  [facility];  Service: Endoscopy;  Laterality: N/A;  rigiflex achalasia balloon dilators  . BOTOX INJECTION N/A 04/08/2015   Procedure: BOTOX INJECTION;  Surgeon: Milus Banister, MD;  Location: [facility];  Service: Endoscopy;  Laterality: N/A;  . CARDIAC CATHETERIZATION  01/11/2006   DEMONSTRATES AKINESIA OF THE DISTAL ANTERIOR WALL, DISTAL INFERIOR WALL AND AKINESIA OF THE APEX. THE BASAL  SEGMENTS CONTRACT WELL AND OVERALL EF 35%  . CATARACT EXTRACTION W/ INTRAOCULAR LENS  IMPLANT, BILATERAL  08/2014-09/2014  . CHOLECYSTECTOMY  2/11  . CORONARY ANGIOPLASTY  1994   TO THE LAD  . ESOPHAGEAL MANOMETRY N/A 03/16/2015   Procedure: ESOPHAGEAL MANOMETRY (EM);  Surgeon: Jerene Bears, MD;  Location: [facility];  Service: Gastroenterology;  Laterality: N/A;  . ESOPHAGOGASTRODUODENOSCOPY N/A 04/08/2015   Procedure: ESOPHAGOGASTRODUODENOSCOPY (EGD);  Surgeon: Milus Banister, MD;  Location: [place];  Service: Endoscopy;  Laterality: N/A;  . ESOPHAGOGASTRODUODENOSCOPY (EGD) WITH PROPOFOL N/A 09/09/2015   Procedure: ESOPHAGOGASTRODUODENOSCOPY (EGD) WITH PROPOFOL;  Surgeon: Mauri Pole, MD;  Location: [facility];  Service: Endoscopy;  Laterality: N/A;  pt. is to have gastrografin xray post recovery-do not discharge until results are back  . EYE SURGERY    . FOOT FRACTURE SURGERY Right 1980's  . INSERT / REPLACE / REMOVE PACEMAKER    . SKIN CANCER EXCISION  "several"   "forearms, head"    Social History   Tobacco Use  . Smoking status: Former Smoker    Packs/day: 1.00    Years: 35.00    Pack years: 35.00    Types: Cigarettes    Last attempt to quit: 08/01/1992    Years since quitting: 25.0  . Smokeless tobacco: Former Systems developer    Quit date: 1994  Substance Use Topics  . Alcohol use: No    Alcohol/week: 0.0 oz    Family History  Problem Relation Age of Onset  . Stroke Father        Mother history unknown - never met her  . Colon cancer Neg Hx      Allergies  Allergen Reactions  . Codeine Other (See Comments)    Gets very angry, disoriented  . Dilaudid [Hydromorphone Hcl] Other (See Comments)    VERY AGITATED, HOSTILE  . Flomax [Tamsulosin Hcl] Shortness Of Breath  . Morphine And Related Other (See Comments)    VERY AGITATED, HOSTILE  . Sulfa Antibiotics Shortness Of Breath  . Beta Adrenergic Blockers Other (See Comments)    Disorientation  . Carvedilol Other  (See Comments)    DISORIENTATION     Prior to Admission medications   Medication Sig Start Date End Date Taking? Authorizing Provider  donepezil (ARICEPT) 10 MG tablet Take 1 tablet (10 mg total) by mouth every morning. 05/01/17  Yes Marcial Pacas, MD  fenofibrate (TRICOR) 145 MG tablet TAKE 1 TABLET BY MOUTH EVERY DAY Patient taking differently: Take 145 mg by mouth once a day 02/10/17  Yes Martinique, Peter M, MD  furosemide (LASIX) 20 MG tablet Take 2 tablets (40 mg total) by mouth daily. Patient taking differently: Take 20 mg by mouth daily.  11/22/16  Yes [place]ique, Peter M, MD  loratadine (CLARITIN) 10 MG tablet Take 1 tablet (10 mg total) by mouth daily. 07/02/15  Yes Barton Dubois, MD  albuterol (PROVENTIL HFA;VENTOLIN HFA) 108 (90 Base) MCG/ACT inhaler Inhale 2 puffs into the lungs every 6 (six) hours as needed for wheezing or shortness of breath. 04/22/16   Eugenie Filler, MD  aspirin 81 MG  EC tablet Take 81 mg by mouth daily.      [provider]  Cholecalciferol (VITAMIN D) 2000 units tablet Take 2,000 Units by mouth daily.     [provider]  finasteride (PROSCAR) 5 MG tablet Take 5 mg by mouth daily. 05/19/17   [provider]  insulin aspart (NOVOLOG FLEXPEN) 100 UNIT/ML FlexPen Inject 5-6 Units into the skin See admin instructions. Patient taking differently: Inject 5-7 Units 3 (three) times daily with meals into the skin.  06/01/17   Philemon Kingdom, MD  Insulin Glargine [facility] KWIKPEN) 100 UNIT/ML SOPN INJECT 16 UNITS UNDER THE SKIN EVERY MORNING 06/01/17   Philemon Kingdom, MD  memantine (NAMENDA) 10 MG tablet Take 1 tablet (10 mg total) by mouth 2 (two) times daily. 05/01/17   Marcial Pacas, MD  Multiple Vitamins-Minerals (PRESERVISION AREDS 2) CAPS Take 1 capsule by mouth 2 (two) times daily.    [provider]  nitroGLYCERIN (NITRODUR - DOSED IN MG/24 HR) 0.2 mg/hr patch Place 1 patch (0.2 mg total) onto the skin daily. Alternates patch placement  with odd/even days. 07/18/17   [place], Peter M, MD  pantoprazole (PROTONIX) 20 MG tablet TAKE 1 TABLET BY MOUTH DAILY 05/26/17   Mauri Pole, MD  pravastatin (PRAVACHOL) 40 MG tablet Take 1 tablet (40 mg total) by mouth at bedtime. 05/26/17   [place], Peter M, MD  predniSONE (DELTASONE) 10 MG tablet 58m x's 5 days and 5 mg for 5 days then stop 08/10/17   Parrett, TFonnie Mu NP  SPIRIVA HANDIHALER 18 MCG inhalation capsule INHALE CONTENTS OF 1 CAPSULE VIA HANDIHALER ONCE DAILY 06/08/17   MJuanito Doom MD  SYMBICORT 160-4.5 MCG/ACT inhaler INHALE 2 PUFFS BY MOUTH TWICE DAILY 07/31/17   MJuanito Doom MD    No current facility-administered medications for this encounter.    Current Outpatient Medications  Medication Sig Dispense Refill  . donepezil (ARICEPT) 10 MG tablet Take 1 tablet (10 mg total) by mouth every morning. 90 tablet 4  . fenofibrate (TRICOR) 145 MG tablet TAKE 1 TABLET BY MOUTH EVERY DAY (Patient taking differently: Take 145 mg by mouth once a day) 90 tablet 3  . furosemide (LASIX) 20 MG tablet Take 2 tablets (40 mg total) by mouth daily. (Patient taking differently: Take 20 mg by mouth daily. ) 180 tablet 3  . loratadine (CLARITIN) 10 MG tablet Take 1 tablet (10 mg total) by mouth daily. 30 tablet 1  . albuterol (PROVENTIL HFA;VENTOLIN HFA) 108 (90 Base) MCG/ACT inhaler Inhale 2 puffs into the lungs every 6 (six) hours as needed for wheezing or shortness of breath. 1 Inhaler 2  . aspirin 81 MG EC tablet Take 81 mg by mouth daily.      . Cholecalciferol (VITAMIN D) 2000 units tablet Take 2,000 Units by mouth daily.     . finasteride (PROSCAR) 5 MG tablet Take 5 mg by mouth daily.  0  . insulin aspart (NOVOLOG FLEXPEN) 100 UNIT/ML FlexPen Inject 5-6 Units into the skin See admin instructions. (Patient taking differently: Inject 5-7 Units 3 (three) times daily with meals into the skin. ) 5 pen 11  . Insulin Glargine (BASAGLAR KWIKPEN) 100 UNIT/ML SOPN INJECT 16 UNITS  UNDER THE SKIN EVERY MORNING 15 mL 11  . memantine (NAMENDA) 10 MG tablet Take 1 tablet (10 mg total) by mouth 2 (two) times daily. 180 tablet 4  . Multiple Vitamins-Minerals (PRESERVISION AREDS 2) CAPS Take 1 capsule by mouth 2 (two) times daily.    .Marland Kitchen  nitroGLYCERIN (NITRODUR - DOSED IN MG/24 HR) 0.2 mg/hr patch Place 1 patch (0.2 mg total) onto the skin daily. Alternates patch placement with odd/even days. 30 patch 3  . pantoprazole (PROTONIX) 20 MG tablet TAKE 1 TABLET BY MOUTH DAILY 90 tablet 0  . pravastatin (PRAVACHOL) 40 MG tablet Take 1 tablet (40 mg total) by mouth at bedtime. 90 tablet 3  . predniSONE (DELTASONE) 10 MG tablet 47m x's 5 days and 5 mg for 5 days then stop 8 tablet 0  . SPIRIVA HANDIHALER 18 MCG inhalation capsule INHALE CONTENTS OF 1 CAPSULE VIA HANDIHALER ONCE DAILY 30 capsule 5  . SYMBICORT 160-4.5 MCG/ACT inhaler INHALE 2 PUFFS BY MOUTH TWICE DAILY 10.2 g 4     VITAL SIGNS: BP (!) 90/50 (BP Location: Right Arm)   Pulse 88   Temp 100.2 F (37.9 C) (Oral)   Resp 20   Ht 5' 8"  (1.727 m)   Wt 73 kg (161 lb)   SpO2 94%   BMI 24.48 kg/m   HEMODYNAMICS:    VENTILATOR SETTINGS:    INTAKE / OUTPUT: No intake/output data recorded.  PHYSICAL EXAMINATION: General:  Pleasant. Well-developed. Alert, awake and oriented to time, person and place. Cooperative. No acute distress. Normal affect. Head: normocephalic, atraumatic EYE: PERRLA, EOM intact, no scleral icterus, no pallor Nose: nares are patent. No polyps. No exudate. No sinus tenderness. Throat/Oral Cavity: Normal dentition. No oral thrush. No exudate. Mucous membranes are moist. No tonsillar enlargement. Neck: supple, no thyromegaly, no JVD, no lymphadenopathy. Trachea midline. Chest/Lung: symmetric in development and expansion. Good air entry. Scattered crackles and rhonchi. Heart: Regular S1 and S2 without murmur, rub or gallop. Abdomen: soft, nontender, nondistended. Normoactive bowel sounds. No  rebound. No guarding. Extremities: no pedal edema, no cyanosis, no clubbing. 2+ DP pulses Lymphatic: no cervical/axiallary/inguinal lymph nodes appreciated Skin:  No rash or lesion. NEURO: cranial nerves II-XII are grossly symmetric and physiologic. Babinski absent. No sensory deficit. No motor deficit. DTR 2+ @ RUE, 2+ @ LUE 2+ @ RLL,  2+ @ LLL. No cerebellar signs. Gait was not assessed.   LABS:  BMET Recent Labs  Lab 08/19/17 0920  NA 140  K 4.1  CL 101  CO2 28  BUN 39*  CREATININE 1.65*  GLUCOSE 151*    Electrolytes Recent Labs  Lab 08/19/17 0920  CALCIUM 9.0    CBC Recent Labs  Lab 08/19/17 0920  WBC 22.4*  HGB 13.8  HCT 43.8  PLT 239    Coag's No results for input(s): APTT, INR in the last 168 hours.  Sepsis Markers Recent Labs  Lab 08/19/17 0957 08/19/17 1114  LATICACIDVEN 1.82 1.61    ABG No results for input(s): PHART, PCO2ART, PO2ART in the last 168 hours.  Liver Enzymes Recent Labs  Lab 08/19/17 0920  AST 45*  ALT 43  ALKPHOS 44  BILITOT 1.0  ALBUMIN 2.9*    Cardiac Enzymes No results for input(s): TROPONINI, PROBNP in the last 168 hours.  Glucose No results for input(s): GLUCAP in the last 168 hours.  Imaging Dg Chest Portable 1 View  Result Date: 08/19/2017 CLINICAL DATA:  Shortness of breath, CHF, chronic systolic dysfunction EXAM: PORTABLE CHEST 1 VIEW COMPARISON:  08/10/2017 FINDINGS: Stable 2 lead left subclavian pacemaker. Normal heart size and vascularity. Negative for edema, significant effusion or pneumothorax. New ill-defined nodular opacities in the right lower lung compared to 08/10/2017. Difficult to exclude developing right lower lung pneumonia. Trachea is midline. Atherosclerosis noted of the  aorta. Degenerative changes of the spine. IMPRESSION: Developing ill-defined right lower lung nodular opacities compared to 9 days ago, difficult to exclude developing pneumonia. Thoracic aortic atherosclerosis Electronically  Signed   By: Jerilynn Mages.  Shick M.D.   On: 08/19/2017 08:59     STUDIES:    CULTURES: Results for orders placed or performed during the hospital encounter of 06/12/17  Culture, blood (Routine x 2)     Status: None   Collection Time: 06/12/17 12:29 PM  Result Value Ref Range Status   Specimen Description BLOOD LEFT ANTECUBITAL  Final   Special Requests   Final    BOTTLES DRAWN AEROBIC AND ANAEROBIC Blood Culture adequate volume   Culture NO GROWTH 5 DAYS  Final   Report Status 06/17/2017 FINAL  Final  Culture, blood (Routine x 2)     Status: None   Collection Time: 06/12/17  1:47 PM  Result Value Ref Range Status   Specimen Description BLOOD RIGHT HAND  Final   Special Requests   Final    BOTTLES DRAWN AEROBIC AND ANAEROBIC Blood Culture results may not be optimal due to an inadequate volume of blood received in culture bottles   Culture NO GROWTH 5 DAYS  Final   Report Status 06/17/2017 FINAL  Final  Respiratory Panel by PCR     Status: None   Collection Time: 06/12/17  3:35 PM  Result Value Ref Range Status   Adenovirus NOT DETECTED NOT DETECTED Final   Coronavirus 229E NOT DETECTED NOT DETECTED Final   Coronavirus HKU1 NOT DETECTED NOT DETECTED Final   Coronavirus NL63 NOT DETECTED NOT DETECTED Final   Coronavirus OC43 NOT DETECTED NOT DETECTED Final   Metapneumovirus NOT DETECTED NOT DETECTED Final   Rhinovirus / Enterovirus NOT DETECTED NOT DETECTED Final   Influenza A NOT DETECTED NOT DETECTED Final   Influenza B NOT DETECTED NOT DETECTED Final   Parainfluenza Virus 1 NOT DETECTED NOT DETECTED Final   Parainfluenza Virus 2 NOT DETECTED NOT DETECTED Final   Parainfluenza Virus 3 NOT DETECTED NOT DETECTED Final   Parainfluenza Virus 4 NOT DETECTED NOT DETECTED Final   Respiratory Syncytial Virus NOT DETECTED NOT DETECTED Final   Bordetella pertussis NOT DETECTED NOT DETECTED Final   Chlamydophila pneumoniae NOT DETECTED NOT DETECTED Final   Mycoplasma pneumoniae NOT DETECTED NOT  DETECTED Final    ANTIBIOTICS:   SIGNIFICANT EVENTS:   LINES/TUBES:    ASSESSMENT / PLAN: Principal Problem:   Influenza A with pneumonia Active Problems:   CAP (community acquired pneumonia)   Coronary artery disease   COPD (chronic obstructive pulmonary disease) ([name])   CKD (chronic kidney disease) stage 3, GFR 30-59 ml/min ([name])  Sputum for Gram stain, C/S Continue Dulera 200/5 and Spiriva Empiric antibiotics: Rocephin. Recently completed a course of azithromycin. Once sputum has been obtained, if the patient improves in subsequent days and wishes to go home to complete therapy, Rocephin could be switched to Ceftin. This patient should have repeat imaging in 6 weeks and follow up visit with Dr. Lake Bells. If the RLL patchy infiltrates have failed to resolve completely, chest CT should be obtained. Flu screen was positive. Start Tamiflu  FAMILY  - Updates: daughter at bedside.   Renee Pain, MD Board Certified by the ABIM, [place] Pager: [phone]  08/19/2017, 7:14 PM

## 2017-08-19 NOTE — H&P (Signed)
Date: 08/19/2017               Patient Name:  Antonio Ray MRN: [id number]  DOB: 10-May-1934 Age / Sex: 84 y.o., male   PCP: [name], MD         Medical Service: Internal Medicine Teaching Service         Attending Physician: Dr. Aldine Contes, MD    First Contact: Dr. Tarri Abernethy Pager: [phone]  Second Contact: Dr. Danford Bad Pager: [phone]       After Hours (After 5p/  First Contact Pager: [phone]  weekends / holidays): Second Contact Pager: [phone]   Chief Complaint: Chills  History of Present Illness: Antonio Ray is an 84 year old male with COPD, type 2 diabetes mellitus, CKD stage III, heart failure reduced ejection fraction status post AICD placement, and achalasia who presented to the ED with 1 day of chills. Daughter is at bedside and helps to provide history. Approximately 2 weeks (1/07) ago the patient began to experience productive cough with light yellow mucus he presented to an urgent care where he was prescribed a prednisone taper and azithromycin for a bronchitis/COPD exacerbation. After a few days of prednisone and antibiotics the patient continued to have symptoms and at that point called his pulmonologist, Dr. Pennie Banter, who extended his prednisone therapy. The patient then presented to Dr. Anastasia Pall office on 1/10 with complaints of continuing cough, congestion, and shortness of breath. Chest x-ray at the time did not show any opacities or infiltrates. At that point his prednisone taper was extended and he was encouraged to continue to use his Symbicort and Spiriva as prescribed. Patient states that he finished all of his prednisone therapy on Wednesday. Since that time he has felt well and states that his shortness of breath, congestion, and cough have improved. However this morning, when he woke up he said that he began to feel cold. At that time he also felt slightly short of breath and felt like his cough increased again. He called his daughter, who lives 5 doors  down, and she proceeded to bring him into the ED for further evaluation.  In the past he has been treated multiple times for pneumonia versus COPD exacerbation. Daughter at bedside states that he tends to go downhill very quickly and felt that it was safest to bring him in. He otherwise feels well. He states that he lives alone and is able to do most of his ADLs independently. He denies any fevers, congestion, rhinorrhea, chest pain, abdominal pain, myalgias, arthralgias, diarrhea, dysuria, polyuria.  Meds:  Current Meds  Medication Sig  . donepezil (ARICEPT) 10 MG tablet Take 1 tablet (10 mg total) by mouth every morning.  . fenofibrate (TRICOR) 145 MG tablet TAKE 1 TABLET BY MOUTH EVERY DAY (Patient taking differently: Take 145 mg by mouth once a day)  . furosemide (LASIX) 20 MG tablet Take 2 tablets (40 mg total) by mouth daily. (Patient taking differently: Take 20 mg by mouth daily. )  . loratadine (CLARITIN) 10 MG tablet Take 1 tablet (10 mg total) by mouth daily.   Allergies: Allergies as of 08/19/2017 - Review Complete 08/19/2017  Allergen Reaction Noted  . Codeine Other (See Comments)   . Dilaudid [hydromorphone hcl] Other (See Comments) 09/10/2013  . Flomax [tamsulosin hcl] Shortness Of Breath 05/19/2012  . Morphine and related Other (See Comments) 05/20/2011  . Sulfa antibiotics Shortness Of Breath 05/23/2012  . Beta adrenergic blockers Other (See Comments) 03/31/2015  . Carvedilol  Other (See Comments) 04/18/2011   Past Medical History:  Diagnosis Date  . Achalasia   . AICD (automatic cardioverter/defibrillator) present 03/30/2011   Analyze ST study patient  . Anginal pain ([place])   . BPH (benign prostatic hyperplasia)   . CHF (congestive heart failure) ([place])   . Chronic systolic dysfunction of left ventricle    EF 30-35%, CLASS II - III SYMPTOMS; intolerant to Coreg  . CKD (chronic kidney disease) stage 3, GFR 30-59 ml/min ([name]) 06/08/2012  . COPD (chronic obstructive  pulmonary disease) ([place])   . Coronary artery disease    History of remote anterior MI with PCI to LAD in 2006; most recent cath 2007, no intervention required  . DOE (dyspnea on exertion)    with heavy exertion  . Dysrhythmia   . Esophageal dysmotility   . GERD (gastroesophageal reflux disease)   . GERD (gastroesophageal reflux disease)   . Head injury, closed, with concussion 2000ish  . Heart murmur    hx  . HOH (hard of hearing)   . Hyperlipidemia   . Memory loss    improved  . Myocardial infarction ([place]) 1994   ANTERIOR  . Pneumonia "several times"   aspiration pna with at least 3 admits for this 2016.   [place] PVC's (premature ventricular contractions)   . Renal failure   . Skin cancer "several"   "forearms; head"  . Type II diabetes mellitus ([name])    Family History:  Father: + CVA  Social History:  Lives independently  Former smoker, 35 pack year hx  Denies the use of EtOH or Illicit substances  Review of Systems: A complete ROS was negative except as per HPI.   Physical Exam: Blood pressure (!) 90/50, pulse 88, temperature 100.2 F (37.9 C), temperature source Oral, resp. rate 20, height 5\' 8"  (1.727 m), weight 161 lb (73 kg), SpO2 94 %.  General: Well-nourished male in no acute distress HENT: Normocephalic, atraumatic, moist mucous membranes Pulm: Not using accessory muscles, good air movement, no crackles or wheezing noted CV: Distant heart sounds, regular rate and rhythm, no murmurs or rubs heard Abdomen: Active bowel sounds, soft, nondistended, no tenderness to palpation Extremities: No lower extremity edema, pulses palpable in all extremities Skin: Warm and dry Neuro: Alert and oriented x3  EKG: personally reviewed: my interpretation is background noise, but appears to be in sinus rhythm. Widened QRS with left bundle branch block.  CXR: personally reviewed: my interpretation is good penetration inspiration, patient is rotated for the AP film. No bony or soft  tissue abnormalities. No cardiomegaly or no pleural effusions. AICD in place.   Assessment & Plan by Problem: Active Problems:   CAP (community acquired pneumonia)  Rigor. Antonio Ray is an 84 year old male with COPD, type 2 diabetes mellitus, CKD stage III, heart failure reduced ejection fraction status post AICD placement, and achalasia who presented to the ED with 1 day of chills. Workup in the emergency department illustrated a leukocytosis of 22,000 and chest x-ray showing ill-defined right lower lobe nodular opacities (new compared to chest x-ray done on 1/10). No wheezing noted on physical exam. This point I do not suspect his presenting symptoms are secondary to a COPD exacerbation. The patient does have risk factors for aspiration pneumonia including achalasia and dementia; however, patient denies any recent aspiration or choking events. Patient received doxycycline in the emergency department. Will check a pro-calcitonin to help determine utility of antibiotics in this situation. At this point I do not believe the  patient needs any further steroid therapy. - Continue doxycycline pending pro-calcitonin - No indication for steroids at this point - Pulmonology to evaluate the patient. Appreciate their recommendations  Achalasia  - History of recurrent aspiration secondary to achalasia confirmed by barium swallow. He has had esophageal dilation in the past. - Family and patient are requesting a regular diet. Understand the risks associated. Will allow the patient does regular diet. No swallow study at this point.  HFrEF - Echocardiogram in September 2017 showing ejection fraction of 20-25% with grade 1 diastolic dysfunction, left atrial enlargement, and normal right-sided function.  - AICD placed - Current therapy includes Lasix 20 mg daily - Not on a beta-blocker due to intolerance, and not on an ACE inhibitor or an ARB due to CKD  CAD - Myocardial infarction in 1994 with emergent  angioplasty of the LAD. Recent cardiac catheterization in 2007 showing nonobstructive disease. - Stress test in 2011 showed no signs of ischemia. - Will hold nitro patch due to hypotension.  COPD - Home inhalers including Symbicort and Spiriva.  CKD Stage III - Stable at this point.  Type 2 DM - Home therapy includes Basaglar 16 units every morning and NovoLog 5-7 units 3 times daily with meals - Lantus 12 units QHS and SSI TID with meals   Diet: Regular VTE ppx: Subcutaneous heparin CODE STATUS: Full code  Dispo: Admit patient to Observation with expected length of stay less than 2 midnights.  Signed: Ina Homes, MD 08/19/2017, 12:38 PM  My Pager: [phone]

## 2017-08-19 NOTE — ED Notes (Signed)
Lunch tray ordered; heart healthy diet 

## 2017-08-20 DIAGNOSIS — I447 Left bundle-branch block, unspecified: Secondary | ICD-10-CM | POA: Diagnosis present

## 2017-08-20 DIAGNOSIS — I2510 Atherosclerotic heart disease of native coronary artery without angina pectoris: Secondary | ICD-10-CM | POA: Diagnosis not present

## 2017-08-20 DIAGNOSIS — J440 Chronic obstructive pulmonary disease with acute lower respiratory infection: Secondary | ICD-10-CM | POA: Diagnosis present

## 2017-08-20 DIAGNOSIS — B9689 Other specified bacterial agents as the cause of diseases classified elsewhere: Secondary | ICD-10-CM | POA: Diagnosis present

## 2017-08-20 DIAGNOSIS — E1122 Type 2 diabetes mellitus with diabetic chronic kidney disease: Secondary | ICD-10-CM | POA: Diagnosis not present

## 2017-08-20 DIAGNOSIS — J181 Lobar pneumonia, unspecified organism: Secondary | ICD-10-CM | POA: Diagnosis not present

## 2017-08-20 DIAGNOSIS — I5020 Unspecified systolic (congestive) heart failure: Secondary | ICD-10-CM | POA: Diagnosis not present

## 2017-08-20 DIAGNOSIS — Z95810 Presence of automatic (implantable) cardiac defibrillator: Secondary | ICD-10-CM | POA: Diagnosis not present

## 2017-08-20 DIAGNOSIS — J449 Chronic obstructive pulmonary disease, unspecified: Secondary | ICD-10-CM | POA: Diagnosis not present

## 2017-08-20 DIAGNOSIS — I252 Old myocardial infarction: Secondary | ICD-10-CM | POA: Diagnosis not present

## 2017-08-20 DIAGNOSIS — E785 Hyperlipidemia, unspecified: Secondary | ICD-10-CM | POA: Diagnosis present

## 2017-08-20 DIAGNOSIS — I130 Hypertensive heart and chronic kidney disease with heart failure and stage 1 through stage 4 chronic kidney disease, or unspecified chronic kidney disease: Secondary | ICD-10-CM | POA: Diagnosis not present

## 2017-08-20 DIAGNOSIS — Z794 Long term (current) use of insulin: Secondary | ICD-10-CM | POA: Diagnosis not present

## 2017-08-20 DIAGNOSIS — J09X2 Influenza due to identified novel influenza A virus with other respiratory manifestations: Secondary | ICD-10-CM

## 2017-08-20 DIAGNOSIS — Z7951 Long term (current) use of inhaled steroids: Secondary | ICD-10-CM

## 2017-08-20 DIAGNOSIS — K219 Gastro-esophageal reflux disease without esophagitis: Secondary | ICD-10-CM | POA: Diagnosis present

## 2017-08-20 DIAGNOSIS — J09X1 Influenza due to identified novel influenza A virus with pneumonia: Secondary | ICD-10-CM | POA: Diagnosis not present

## 2017-08-20 DIAGNOSIS — I5022 Chronic systolic (congestive) heart failure: Secondary | ICD-10-CM | POA: Diagnosis present

## 2017-08-20 DIAGNOSIS — Z87891 Personal history of nicotine dependence: Secondary | ICD-10-CM | POA: Diagnosis not present

## 2017-08-20 DIAGNOSIS — N400 Enlarged prostate without lower urinary tract symptoms: Secondary | ICD-10-CM | POA: Diagnosis present

## 2017-08-20 DIAGNOSIS — I959 Hypotension, unspecified: Secondary | ICD-10-CM | POA: Diagnosis present

## 2017-08-20 DIAGNOSIS — N183 Chronic kidney disease, stage 3 (moderate): Secondary | ICD-10-CM | POA: Diagnosis not present

## 2017-08-20 DIAGNOSIS — Z79899 Other long term (current) drug therapy: Secondary | ICD-10-CM | POA: Diagnosis not present

## 2017-08-20 DIAGNOSIS — J1008 Influenza due to other identified influenza virus with other specified pneumonia: Secondary | ICD-10-CM | POA: Diagnosis present

## 2017-08-20 DIAGNOSIS — K220 Achalasia of cardia: Secondary | ICD-10-CM | POA: Diagnosis not present

## 2017-08-20 DIAGNOSIS — Z882 Allergy status to sulfonamides status: Secondary | ICD-10-CM | POA: Diagnosis not present

## 2017-08-20 DIAGNOSIS — F0390 Unspecified dementia without behavioral disturbance: Secondary | ICD-10-CM | POA: Diagnosis present

## 2017-08-20 DIAGNOSIS — Z885 Allergy status to narcotic agent status: Secondary | ICD-10-CM | POA: Diagnosis not present

## 2017-08-20 DIAGNOSIS — Z888 Allergy status to other drugs, medicaments and biological substances status: Secondary | ICD-10-CM | POA: Diagnosis not present

## 2017-08-20 DIAGNOSIS — H9190 Unspecified hearing loss, unspecified ear: Secondary | ICD-10-CM | POA: Diagnosis present

## 2017-08-20 LAB — CBC
HCT: 40.9 % (ref 39.0–52.0)
Hemoglobin: 12.7 g/dL — ABNORMAL LOW (ref 13.0–17.0)
MCH: 27.6 pg (ref 26.0–34.0)
MCHC: 31.1 g/dL (ref 30.0–36.0)
MCV: 88.9 fL (ref 78.0–100.0)
PLATELETS: 209 10*3/uL (ref 150–400)
RBC: 4.6 MIL/uL (ref 4.22–5.81)
RDW: 16.2 % — ABNORMAL HIGH (ref 11.5–15.5)
WBC: 12.5 10*3/uL — ABNORMAL HIGH (ref 4.0–10.5)

## 2017-08-20 LAB — COMPREHENSIVE METABOLIC PANEL
ALK PHOS: 43 U/L (ref 38–126)
ALT: 34 U/L (ref 17–63)
AST: 38 U/L (ref 15–41)
Albumin: 2.7 g/dL — ABNORMAL LOW (ref 3.5–5.0)
Anion gap: 12 (ref 5–15)
BILIRUBIN TOTAL: 0.6 mg/dL (ref 0.3–1.2)
BUN: 33 mg/dL — ABNORMAL HIGH (ref 6–20)
CALCIUM: 8.8 mg/dL — AB (ref 8.9–10.3)
CO2: 22 mmol/L (ref 22–32)
CREATININE: 1.55 mg/dL — AB (ref 0.61–1.24)
Chloride: 103 mmol/L (ref 101–111)
GFR calc non Af Amer: 40 mL/min — ABNORMAL LOW (ref >60)
GFR, EST AFRICAN AMERICAN: 47 mL/min — AB (ref >60)
Glucose, Bld: 207 mg/dL — ABNORMAL HIGH (ref 65–99)
Potassium: 4.5 mmol/L (ref 3.5–5.1)
SODIUM: 137 mmol/L (ref 135–145)
Total Protein: 6 g/dL — ABNORMAL LOW (ref 6.5–8.1)

## 2017-08-20 LAB — EXPECTORATED SPUTUM ASSESSMENT W GRAM STAIN, RFLX TO RESP C

## 2017-08-20 LAB — GLUCOSE, CAPILLARY
GLUCOSE-CAPILLARY: 199 mg/dL — AB (ref 65–99)
GLUCOSE-CAPILLARY: 140 mg/dL — AB (ref 65–99)
Glucose-Capillary: 110 mg/dL — ABNORMAL HIGH (ref 65–99)
Glucose-Capillary: 170 mg/dL — ABNORMAL HIGH (ref 65–99)

## 2017-08-20 LAB — EXPECTORATED SPUTUM ASSESSMENT W REFEX TO RESP CULTURE

## 2017-08-20 MED ORDER — GUAIFENESIN ER 600 MG PO TB12
600.0000 mg | ORAL_TABLET | Freq: Two times a day (BID) | ORAL | Status: DC
  Administered 2017-08-20 – 2017-08-22 (×4): 600 mg via ORAL
  Filled 2017-08-20 (×4): qty 1

## 2017-08-20 NOTE — Progress Notes (Signed)
SATURATION QUALIFICATIONS: (This note is used to comply with regulatory documentation for home oxygen)  Patient Saturations on Room Air at Rest = 94%  Patient Saturations on Room Air while Ambulating =81-94 %  Patient Saturations on 2 Liters of oxygen while Ambulating = 94-96%  Please briefly explain why patient needs home oxygen: Patient has an air compressor at home which he uses when dyspneic

## 2017-08-20 NOTE — Progress Notes (Signed)
  Date: 08/20/2017  Patient name: Antonio Ray  Medical record number: [id number]  Date of birth: [birth year]   I have seen and evaluated Antonio Ray and discussed their care with the Residency Team.  In brief, patient is an 84 year old male with a past medical history of COPD, insulin-dependent diabetes, CKD stage III, heart failure with reduced ejection fraction status post AICD placement who presented to the ED with chills and persistent cough.  Approximately 2 weeks ago patient developed a productive cough productive of yellow mucus and was seen at an urgent care center and given a course of steroids and azithromycin for possible COPD exacerbation.  He also follow-up with the pulmonologist who continued him on the steroids.  He had an x-ray done as an outpatient which did not show any evidence of an underlying pneumonia.  He completed his prednisone taper on Wednesday and states that he felt well initially after the taper but yesterday morning woke up with chills and some worsening shortness of breath and recurrent cough.  He is brought to the ED for further evaluation.  Today patient states that he continues to have a productive cough with yellowish phlegm and that he feels weak and tired.  PMHx, Fam Hx, and/or Soc Hx : As per resident admit note  Vitals:   08/20/17 0454 08/20/17 0855  BP: (!) 115/56   Pulse: 68   Resp: 18   Temp: 98.3 F (36.8 C)   SpO2: 92% 92%   General: Awake alert and oriented x3, NAD CVS: Regular rate and rhythm, normal heart sounds Lungs: CTA bilaterally Abdomen: Soft, nontender, nondistended, normoactive bowel sounds Extremities: No edema noted  Assessment and Plan: I have seen and evaluated the patient as outlined above. I agree with the formulated Assessment and Plan as detailed in the residents' note, with the following changes:   1.  Influenza A: -Patient presented to the ED with sudden onset of chills and worsening cough after previously being  treated for a COPD exacerbation and was found to have influenza A.  Chest x-ray with possible right lower lobe infiltrate and patient also noted to have a leukocytosis with a white count of 22.  There is concern for possible superimposed pneumonia in addition to his influenza A. - pulmonology follow-up and recommendations appreciated -Continue with IV ceftriaxone for now.  Patient recently completed a course of azithromycin. -We will follow-up sputum cultures -Continue Tamiflu for his influenza A -We will continue to monitor the patient in the hospital today and if he improves will consider discharge home in the morning on oral antibiotics and Tamiflu. -No further steroids at this time  Aldine Contes, MD 1/20/201911:32 AM

## 2017-08-20 NOTE — Progress Notes (Signed)
D/w DR Dareen Piano  Ccm can see patient 08/21/17   Dr. Brand Males, M.D., F.C.C.P Pulmonary and Critical Care Medicine Staff Physician, [place] Director - Interstitial Lung Disease  Program  Pulmonary [place] at [place], [place], [address]  Pager: [phone], If no answer or between  15:00h - 7:00h: call [phone] Telephone: [phone]

## 2017-08-20 NOTE — Progress Notes (Signed)
   Subjective: Antonio Ray states that he feels worse today compared to yesterday. He continues to have a cough productive of yellow sputum. States that he feels winded. He is tolerating p.o. intake without difficulty. He is not got up to ambulate but is sitting in a chair this morning. We discussed the plan to continue Tamiflu and Rocephin at this point. We will monitor him today and if things improve he may be able to be discharged tomorrow. He agrees with the plan. All questions and concerns addressed.  Objective: Vital signs in last 24 hours: Vitals:   08/19/17 1330 08/19/17 1419 08/19/17 2128 08/20/17 0454  BP: (!) 100/48 (!) 101/43 (!) 107/42 (!) 115/56  Pulse: 71 77 60 68  Resp: (!) 27   18  Temp:  98.4 F (36.9 C) (!) 97.3 F (36.3 C) 98.3 F (36.8 C)  TempSrc:  Oral Oral   SpO2: 90% 99% 94% 92%  Weight:    161 lb 2.5 oz (73.1 kg)  Height:       General: Well-nourished male in no acute distress Pulm: Good air movement, no crackles, no wheezing CV: Regular rate and rhythm, no murmurs, no rubs Abdomen: Active bowel sounds, soft, nondistended, no tenderness to palpation Extremities: No lower extremity edema  Assessment/Plan:  Antonio Ray is an 84 year old male with COPD, type 2 diabetes mellitus, CKD stage III, heart failure reduced ejection fraction status post AICD placement, and achalasia who presented to the ED with 1 day of chills. Subsequent workup illustrated positive influenza A testing. He continues to feel short of breath and have a productive cough today. Further management as outlined below.  Influenza A. - Patient presented with acute onset cough and rigor  - Subsequent testing illustrated positive influenza A. Patient started on Tamiflu. - Pulmonology consulted as the patient follows with Dr. Lake Bells as an outpatient. Recommending continuing Tamiflu and Rocephin with transition to Ceftin on discharge. - No indications for steroids at this time. - We will need  ambulatory pulse ox - A.m. labs pending  Achalasia  - History of recurrent aspiration secondary to achalasia confirmed by barium swallow. He has had esophageal dilation in the past. - Continue regular diet  HFrEF - Echocardiogram in September 2017 showing ejection fraction of 20-25% with grade 1 diastolic dysfunction, left atrial enlargement, and normal right-sided function.  - AICD placed - Current therapy includes Lasix 20 mg daily - Not on a beta-blocker due to intolerance, and not on an ACE inhibitor or an ARB due to CKD  CAD - Myocardial infarction in 1994 with emergent angioplasty of the LAD. Recent cardiac catheterization in 2007 showing nonobstructive disease. - Stress test in 2011 showed no signs of ischemia. - Will hold nitro patch due to hypotension.  COPD - Home inhalers including Symbicort and Spiriva.  CKD Stage III - Stable at this point.  Type 2 DM - Home therapy includes Basaglar 16 units every morning and NovoLog 5-7 units 3 times daily with meals - Lantus 12 units QHS and SSI TID with meals   Dispo: Anticipated discharge in approximately 0-1 day(s).   Ina Homes, MD 08/20/2017, 5:37 AM My Pager: [phone]

## 2017-08-21 DIAGNOSIS — J181 Lobar pneumonia, unspecified organism: Secondary | ICD-10-CM

## 2017-08-21 DIAGNOSIS — J09X1 Influenza due to identified novel influenza A virus with pneumonia: Secondary | ICD-10-CM

## 2017-08-21 LAB — CBC
HCT: 38 % — ABNORMAL LOW (ref 39.0–52.0)
HEMOGLOBIN: 11.7 g/dL — AB (ref 13.0–17.0)
MCH: 27.3 pg (ref 26.0–34.0)
MCHC: 30.8 g/dL (ref 30.0–36.0)
MCV: 88.6 fL (ref 78.0–100.0)
PLATELETS: 193 10*3/uL (ref 150–400)
RBC: 4.29 MIL/uL (ref 4.22–5.81)
RDW: 16.4 % — ABNORMAL HIGH (ref 11.5–15.5)
WBC: 6.6 10*3/uL (ref 4.0–10.5)

## 2017-08-21 LAB — BASIC METABOLIC PANEL
ANION GAP: 11 (ref 5–15)
BUN: 33 mg/dL — ABNORMAL HIGH (ref 6–20)
CHLORIDE: 104 mmol/L (ref 101–111)
CO2: 25 mmol/L (ref 22–32)
Calcium: 8.8 mg/dL — ABNORMAL LOW (ref 8.9–10.3)
Creatinine, Ser: 1.55 mg/dL — ABNORMAL HIGH (ref 0.61–1.24)
GFR, EST AFRICAN AMERICAN: 47 mL/min — AB (ref >60)
GFR, EST NON AFRICAN AMERICAN: 40 mL/min — AB (ref >60)
Glucose, Bld: 130 mg/dL — ABNORMAL HIGH (ref 65–99)
POTASSIUM: 4.1 mmol/L (ref 3.5–5.1)
SODIUM: 140 mmol/L (ref 135–145)

## 2017-08-21 LAB — GLUCOSE, CAPILLARY
GLUCOSE-CAPILLARY: 116 mg/dL — AB (ref 65–99)
GLUCOSE-CAPILLARY: 170 mg/dL — AB (ref 65–99)
Glucose-Capillary: 127 mg/dL — ABNORMAL HIGH (ref 65–99)
Glucose-Capillary: 121 mg/dL — ABNORMAL HIGH (ref 65–99)
Glucose-Capillary: 160 mg/dL — ABNORMAL HIGH (ref 65–99)

## 2017-08-21 NOTE — Progress Notes (Signed)
Initial Nutrition Assessment  DOCUMENTATION CODES:   Not applicable  INTERVENTION:   -Continue Ensure Enlive po BID, each supplement provides 350 kcal and 20 grams of protein  NUTRITION DIAGNOSIS:   Unintentional weight loss related to chronic illness(alchalasia) as evidenced by percent weight loss.  GOAL:   Patient will meet greater than or equal to 90% of their needs  MONITOR:   PO intake, Supplement acceptance, Labs, Weight trends, Skin, I & O's  REASON FOR ASSESSMENT:   Malnutrition Screening Tool    ASSESSMENT:   Antonio Ray is an 84 year old male with COPD, type 2 diabetes mellitus, CKD stage III, heart failure reduced ejection fraction status post AICD placement, and achalasia who presented to the ED with 1 day of chills   Pt admitted with CAP/ influenza A.   Pt sleepy at time of visit. Hx obtained from pt son at bedside, who reports pt typically has a good appetite. Pt consumes 3 meals per day (Breakfast: eggs, grits, sausage, Lunch: sandwich, Dinner: meat, starch, and vegetable). Pt also consumes 1-2 Ensure supplements daily and pt family is very vigilant about pt's sodium intake. Pt with good meal completion (PO: 25-100%). Per son, pt has been consuming Ensure supplements well.   Per pt son, UBW is around 190#. Pt has lost weight progressively over the past 2 years, which son suspects is related to variable intake due to achalasia and adjustments in diuretic regimen. Of note, pt does weigh himself daily, however, is often not accurate with recording wt readings, so son is often unsure of true weight changes from day to day. Noted pt has experienced a 13% wt loss over the past 6 months, which is significant for time frame. Suspect wt loss is multifactorial. Noted mild muscle depletion on lower extremities, which is likely due to advanced age and decline in mobility.  Discussed with pt and son importance of good meal and supplement intake to promote healing. Encouraged  continuing supplements at home.   Labs reviewed: CGS: 121-160 (0-15 units isnulin aspart TID with meals, 12 units insuklin aspart every morning)  NUTRITION - FOCUSED PHYSICAL EXAM:    Most Recent Value  Orbital Region  No depletion  Upper Arm Region  No depletion  Thoracic and Lumbar Region  No depletion  Buccal Region  No depletion  Temple Region  No depletion  Clavicle Bone Region  No depletion  Clavicle and Acromion Bone Region  No depletion  Scapular Bone Region  No depletion  Dorsal Hand  No depletion  Patellar Region  Mild depletion  Anterior Thigh Region  Mild depletion  Posterior Calf Region  Mild depletion  Edema (RD Assessment)  None  Hair  Reviewed  Eyes  Reviewed  Mouth  Reviewed  Skin  Reviewed  Nails  Reviewed       Diet Order:  Diet regular Room service appropriate? Yes; Fluid consistency: Thin  EDUCATION NEEDS:   Education needs have been addressed  Skin:  Skin Assessment: Reviewed RN Assessment  Last BM:  08/19/17  Height:   Ht Readings from Last 1 Encounters:  08/19/17 5\' 8"  (1.727 m)    Weight:   Wt Readings from Last 1 Encounters:  08/20/17 161 lb 2.5 oz (73.1 kg)    Ideal Body Weight:  70 kg  BMI:  Body mass index is 24.5 kg/m.  Estimated Nutritional Needs:   Kcal:  1600-1800  Protein:  80-95 grams  Fluid:  1.6-1.8 L    Lanice Folden A. Jimmye Norman, RD, LDN, CDE  Pager: [phone] After hours Pager: [phone]

## 2017-08-21 NOTE — Progress Notes (Addendum)
Pt.is alert and confused.Psrtly he was febrile.He was very agitated and didn't know where he is  Even when his Temp was controlled.Pt. continuously asking for his family Soil scientist and son).He want to go home and thinks held as Doctor, general practice.Nurse call pt.son and make him talk with his son.Pt was calm for a while and keep getting up to go home which make him at risk to fall.a sitter was assigned for his safety,will continue to monitor.

## 2017-08-21 NOTE — Discharge Instructions (Signed)
Thank you for allowing [place] to provide your care. Please pick up your Tamiflu and Ceftin for your Flu and pneumonia. Please follow-up with your primary care provider and Dr. Pennie Banter as soon as possible.   Influenza, Adult Influenza (the flu") is an infection in the lungs, nose, and throat (respiratory tract). It is caused by a virus. The flu causes many common cold symptoms, as well as a high fever and body aches. It can make you feel very sick. The flu spreads easily from person to person (is contagious). Getting a flu shot (influenza vaccination) every year is the best way to prevent the flu. Follow these instructions at home:  Take over-the-counter and prescription medicines only as told by your doctor.  Use a cool mist humidifier to add moisture (humidity) to the air in your home. This can make it easier to breathe.  Rest as needed.  Drink enough fluid to keep your pee (urine) clear or pale yellow.  Cover your mouth and nose when you cough or sneeze.  Wash your hands with soap and water often, especially after you cough or sneeze. If you cannot use soap and water, use hand sanitizer.  Stay home from work or school as told by your doctor. Unless you are visiting your doctor, try to avoid leaving home until your fever has been gone for 24 hours without the use of medicine.  Keep all follow-up visits as told by your doctor. This is important. How is this prevented?  Getting a yearly (annual) flu shot is the best way to avoid getting the flu. You may get the flu shot in late summer, fall, or winter. Ask your doctor when you should get your flu shot.  Wash your hands often or use hand sanitizer often.  Avoid contact with people who are sick during cold and flu season.  Eat healthy foods.  Drink plenty of fluids.  Get enough sleep.  Exercise regularly. Contact a doctor if:  You get new symptoms.  You have: ? Chest pain. ? Watery poop (diarrhea). ? A fever.  Your cough gets  worse.  You start to have more mucus.  You feel sick to your stomach (nauseous).  You throw up (vomit). Get help right away if:  You start to be short of breath or have trouble breathing.  Your skin or nails turn a bluish color.  You have very bad pain or stiffness in your neck.  You get a sudden headache.  You get sudden pain in your face or ear.  You cannot stop throwing up. This information is not intended to replace advice given to you by your health care provider. Make sure you discuss any questions you have with your health care provider. Document Released: 04/26/2008 Document Revised: 12/24/2015 Document Reviewed: 05/12/2015 Elsevier Interactive Patient Education  2017 [employer].

## 2017-08-21 NOTE — Progress Notes (Addendum)
   Subjective: Patient states that he had a lot of issues overnight. He was very confused and felt like there was something out there. He does remember talking to his son and becoming agitated. States that he was told when people get fevers they can become confused. We discussed that it is very common for people to become confused when they are in the hospital, in an unfamiliar situation and frequently interrupted sleep. He otherwise states that he feels well. States that he feels that his breathing and cough have improved. We discussed that we will recheck his oxygen today while he is walking and continue his Tamiflu and Rocephin. Pulmonology will see him today. All questions and concerns addressed.  Objective: Vital signs in last 24 hours: Vitals:   08/20/17 2200 08/20/17 2230 08/21/17 0116 08/21/17 0529  BP:    (!) 114/51  Pulse:      Resp:    18  Temp: 99.4 F (37.4 C) 98.4 F (36.9 C) 98.7 F (37.1 C) 97.7 F (36.5 C)  TempSrc: Oral Oral Axillary Axillary  SpO2:    100%  Weight:      Height:       General: Well-nourished male in no acute distress Pulm: Good air movement, no crackles, no wheezing CV: Regular rate and rhythm, no murmurs, no rubs Abdomen: Active bowel sounds, soft, nondistended, no tenderness to palpation Extremities: No lower extremity edema  Assessment/Plan:  Antonio Ray is an 84 year old male with COPD, type 2 diabetes mellitus, CKD stage III, heart failure reduced ejection fraction status post AICD placement, and achalasia who presented to the ED with 1 day of chills. Subsequent workup illustrated positive influenza A testing. That his breathing and cough have improved, but did become delirious overnight. Further management as outlined below.  Influenza A / CAP. - Patient presented with acute onset cough and rigor  - Became febrile over the interval, with a T-max of 102 F. Leukocytosis has resolved - Continuing Tamiflu and Rocephin (will transition to Ceftin  on discharge). - Ambulatory pulse ox showed the patient desaturated down to 81%, but with 2 L/min nasal cannula maintained oxygen saturations greater than 90% while ambulating. - No indications for steroids at this time. - Although patient did become febrile over the interval I do not think we need to change any antibiotics at this point. - Pulmonology to reevaluate today  Dementia - On Namenda and Aricept - Became confused last night, consistent with delirium. Delirium precautions ordered  Achalasia -History of recurrent aspiration secondary to achalasia confirmedby barium swallow. He has had esophageal dilation in the past. -Continue regular diet  HFrEF -Echocardiogram in September 2017 showing ejection fraction of 20-25% with grade 1 diastolic dysfunction, left atrial enlargement, and normal right-sided function. -AICD placed -Current therapy includes Lasix 20 mg daily -Not on a beta-blocker due to intolerance, and not on an ACE inhibitor or anARBdue to CKD  CAD -Myocardial infarction in 1994 with emergent angioplasty of the LAD. Recent cardiac catheterization in 2007 showing nonobstructive disease. -Stress test in 2011 showed no signs of ischemia. -Will hold nitro patch due to hypotension.  COPD -Home inhalers including Symbicort and Spiriva.  CKD Stage III -Stable at this point.  Type 2 DM -Home therapy includesBasaglar16 units every morning and NovoLog 5-7 units 3 times daily with meals - Lantus 12 units QHS and SSI TID with meals  Dispo: Anticipated discharge pending clinical improvement.   Antonio Homes, MD 08/21/2017, 6:02 AM My Pager: [phone]

## 2017-08-21 NOTE — Progress Notes (Signed)
  84 year old with COPD admitted with influenza A He has defervesced and does not appear to be in any respiratory distress. He is somnolent but arouses easily and interacts.  He walked with physical therapy today.  Oxygen saturation was 98% on 2 L and I cut him down to 1 L with 96% saturation. He likely does not need oxygen He is on ceftriaxone for right lower lobe infiltrate, sputum culture is showing gram-positive cocci in pairs and moderate gram-negative rods. If nothing grows out he can complete 7 days of antibiotics as outpatient  P CCM available as needed  Tregan Read V. Elsworth Soho MD

## 2017-08-21 NOTE — Discharge Summary (Signed)
Name: Antonio Ray MRN: [id number] DOB: June 09, 1934 84 y.o. PCP: [name], MD  Date of Admission: 08/19/2017  8:08 AM Date of Discharge:  Attending Physician: Axel Filler, *  Discharge Diagnosis: 1.  Influenza A with pneumonia  Principal Problem:   Influenza A with pneumonia Active Problems:   Coronary artery disease   COPD (chronic obstructive pulmonary disease) ([name])   CKD (chronic kidney disease) stage 3, GFR 30-59 ml/min ([name])   CAP (community acquired pneumonia)  Discharge Medications: Allergies as of 08/22/2017      Reactions   Codeine Other (See Comments)   Gets very angry, disoriented   Dilaudid [hydromorphone Hcl] Other (See Comments)   VERY AGITATED, HOSTILE   Flomax [tamsulosin Hcl] Shortness Of Breath   Morphine And Related Other (See Comments)   VERY AGITATED, HOSTILE   Sulfa Antibiotics Shortness Of Breath   Beta Adrenergic Blockers Other (See Comments)   Disorientation   Carvedilol Other (See Comments)   DISORIENTATION      Medication List    STOP taking these medications   predniSONE 10 MG tablet Commonly known as:  DELTASONE     TAKE these medications   albuterol 108 (90 Base) MCG/ACT inhaler Commonly known as:  PROVENTIL HFA;VENTOLIN HFA Inhale 2 puffs into the lungs every 6 (six) hours as needed for wheezing or shortness of breath.   aspirin 81 MG EC tablet Take 81 mg by mouth daily.   BASAGLAR KWIKPEN 100 UNIT/ML Sopn INJECT 16 UNITS UNDER THE SKIN EVERY MORNING What changed:    how much to take  how to take this  when to take this  additional instructions   cefUROXime 500 MG tablet Commonly known as:  CEFTIN Take 1 tablet (500 mg total) by mouth 2 (two) times daily for 3 days.   donepezil 10 MG tablet Commonly known as:  ARICEPT Take 1 tablet (10 mg total) by mouth every morning.   fenofibrate 145 MG tablet Commonly known as:  TRICOR TAKE 1 TABLET BY MOUTH EVERY DAY What changed:    how much to  take  how to take this  when to take this   finasteride 5 MG tablet Commonly known as:  PROSCAR Take 5 mg by mouth daily.   furosemide 20 MG tablet Commonly known as:  LASIX Take 2 tablets (40 mg total) by mouth daily. What changed:  how much to take   insulin aspart 100 UNIT/ML FlexPen Commonly known as:  NOVOLOG FLEXPEN Inject 5-6 Units into the skin See admin instructions. What changed:    how much to take  when to take this  reasons to take this   loratadine 10 MG tablet Commonly known as:  CLARITIN Take 1 tablet (10 mg total) by mouth daily.   memantine 10 MG tablet Commonly known as:  NAMENDA Take 1 tablet (10 mg total) by mouth 2 (two) times daily.   nitroGLYCERIN 0.2 mg/hr patch Commonly known as:  NITRODUR - Dosed in mg/24 hr Place 1 patch (0.2 mg total) onto the skin daily. Alternates patch placement with odd/even days.   oseltamivir 30 MG capsule Commonly known as:  TAMIFLU Take 1 capsule (30 mg total) by mouth 2 (two) times daily for 2 days.   pantoprazole 20 MG tablet Commonly known as:  PROTONIX TAKE 1 TABLET BY MOUTH DAILY   pravastatin 40 MG tablet Commonly known as:  PRAVACHOL Take 1 tablet (40 mg total) by mouth at bedtime.   PRESERVISION AREDS 2  Caps Take 1 capsule by mouth 2 (two) times daily.   SPIRIVA HANDIHALER 18 MCG inhalation capsule Generic drug:  tiotropium INHALE CONTENTS OF 1 CAPSULE VIA HANDIHALER ONCE DAILY   SYMBICORT 160-4.5 MCG/ACT inhaler Generic drug:  budesonide-formoterol INHALE 2 PUFFS BY MOUTH TWICE DAILY   Vitamin D 2000 units tablet Take 2,000 Units by mouth daily.     Disposition and follow-up:   Antonio Ray was discharged from [facility] in Stable condition.  At the hospital follow up visit please address:  1.  Influenza with pneumonia.  Discussed whether the patient was able to finish his course of Tamiflu and Ceftin without complications.   2.  Labs / imaging needed at time of  follow-up: None  3.  Pending labs/ test needing follow-up: None  Follow-up Appointments: Follow-up Information    [name], MD Follow up.   Specialty:  Family Medicine Contact information: [place] [address] [phone]        Martinique, Peter M, MD Follow up.   Specialty:  Cardiology Contact information: [address] [place] [address] [phone]        Juanito Doom, MD Follow up.   Specialty:  Pulmonary Disease Contact information: [place] [address] [phone]         Hospital Course by problem list: Principal Problem:   Influenza A with pneumonia Active Problems:   Coronary artery disease   COPD (chronic obstructive pulmonary disease) ([name])   CKD (chronic kidney disease) stage 3, GFR 30-59 ml/min ([name])   CAP (community acquired pneumonia)   Influenza A with pneumonia. Antonio Ray is an 84 year old male with COPD, type 2 diabetes mellitus, CKD stage III, heart failure reduced ejection fraction status post AICD placement, and achalasia who presented to the ED with 1 day of chills. Workup in the emergency department illustrated a leukocytosis of 22,000 and chest x-ray showing ill-defined right lower lobe nodular opacities (new compared to chest x-ray done on 1/10). Subsequent testing illustrated positive influenza A. Patient was started on Tamiflu and Rocephin. Pulmonology was consulted and evaluated the patient and agreed with the treatment plan. The patient's cough and chills subsequently improved over the hospitalization. There was no wheezing on exam so the patient was not treated with steroids. Patient was discharged in stable condition on Ceftin. He was given instructions to follow-up with his primary care provider and pulmonologist as soon as possible. Aside from the addition of Ceftin, no other changes were made to the patient's medical management.  Discharge Vitals:   BP (!) 112/54 (BP  Location: Left Arm)   Pulse 60   Temp 97.8 F (36.6 C) (Oral)   Resp 17   Ht 5\' 8"  (1.727 m)   Wt 136 lb 14.5 oz (62.1 kg)   SpO2 94%   BMI 20.82 kg/m   Pertinent Labs, Studies, and Procedures:   Chest X-ray - Developing ill-defined right lower lung nodular opacities compared to 9 days ago, difficult to exclude developing pneumonia. - Thoracic aortic atherosclerosis  Discharge Instructions: Discharge Instructions    Call MD for:  difficulty breathing, headache or visual disturbances   Complete by:  As directed    Diet - low sodium heart healthy   Complete by:  As directed    Discharge instructions   Complete by:  As directed    Please pick up and take your Tamiflu and Ceftin as prescribed. Follow up with your primary care  doctor and Mr. Lake Bells as soon as possible.   Increase activity slowly   Complete by:  As directed      Signed: Ina Homes, MD 08/22/2017, 12:59 PM   My Pager: [phone]

## 2017-08-21 NOTE — Care Management Note (Signed)
Case Management Note  Patient Details  Name: DARYUS SOWASH MRN: [id number] Date of Birth: January 27, 1934  Subjective/Objective:                    Action/Plan:  Consult for home oxygen will need order with flow rate. Qualifying saturation note within 24  Hours of discharge. Current note does not qualify for home oxygen due to range 81 to 94% documented  Expected Discharge Date:  08/21/17               Expected Discharge Plan:  Home/Self Care  In-House Referral:     Discharge planning Services  CM Consult  Post Acute Care Choice:  Durable Medical Equipment Choice offered to:     DME Arranged:  Oxygen DME Agency:     HH Arranged:  NA HH Agency:  NA  Status of Service:  In process, will continue to follow  If discussed at Long Length of Stay Meetings, dates discussed:    Additional Comments:  Marilu Favre, RN 08/21/2017, 9:02 AM

## 2017-08-22 DIAGNOSIS — K220 Achalasia of cardia: Secondary | ICD-10-CM

## 2017-08-22 DIAGNOSIS — E1122 Type 2 diabetes mellitus with diabetic chronic kidney disease: Secondary | ICD-10-CM

## 2017-08-22 DIAGNOSIS — Z888 Allergy status to other drugs, medicaments and biological substances status: Secondary | ICD-10-CM

## 2017-08-22 DIAGNOSIS — Z95810 Presence of automatic (implantable) cardiac defibrillator: Secondary | ICD-10-CM

## 2017-08-22 DIAGNOSIS — Z885 Allergy status to narcotic agent status: Secondary | ICD-10-CM

## 2017-08-22 DIAGNOSIS — J449 Chronic obstructive pulmonary disease, unspecified: Secondary | ICD-10-CM

## 2017-08-22 DIAGNOSIS — I130 Hypertensive heart and chronic kidney disease with heart failure and stage 1 through stage 4 chronic kidney disease, or unspecified chronic kidney disease: Secondary | ICD-10-CM

## 2017-08-22 DIAGNOSIS — N183 Chronic kidney disease, stage 3 (moderate): Secondary | ICD-10-CM

## 2017-08-22 DIAGNOSIS — I5020 Unspecified systolic (congestive) heart failure: Secondary | ICD-10-CM

## 2017-08-22 DIAGNOSIS — Z882 Allergy status to sulfonamides status: Secondary | ICD-10-CM

## 2017-08-22 LAB — CBC
HEMATOCRIT: 39.2 % (ref 39.0–52.0)
HEMOGLOBIN: 12 g/dL — AB (ref 13.0–17.0)
MCH: 27.5 pg (ref 26.0–34.0)
MCHC: 30.6 g/dL (ref 30.0–36.0)
MCV: 89.7 fL (ref 78.0–100.0)
Platelets: 194 10*3/uL (ref 150–400)
RBC: 4.37 MIL/uL (ref 4.22–5.81)
RDW: 16.8 % — ABNORMAL HIGH (ref 11.5–15.5)
WBC: 5.4 10*3/uL (ref 4.0–10.5)

## 2017-08-22 LAB — GLUCOSE, CAPILLARY
Glucose-Capillary: 99 mg/dL (ref 65–99)
Glucose-Capillary: 118 mg/dL — ABNORMAL HIGH (ref 65–99)

## 2017-08-22 LAB — BASIC METABOLIC PANEL
ANION GAP: 11 (ref 5–15)
BUN: 32 mg/dL — ABNORMAL HIGH (ref 6–20)
CO2: 24 mmol/L (ref 22–32)
Calcium: 8.9 mg/dL (ref 8.9–10.3)
Chloride: 105 mmol/L (ref 101–111)
Creatinine, Ser: 1.44 mg/dL — ABNORMAL HIGH (ref 0.61–1.24)
GFR, EST AFRICAN AMERICAN: 51 mL/min — AB (ref >60)
GFR, EST NON AFRICAN AMERICAN: 44 mL/min — AB (ref >60)
GLUCOSE: 104 mg/dL — AB (ref 65–99)
POTASSIUM: 4.3 mmol/L (ref 3.5–5.1)
Sodium: 140 mmol/L (ref 135–145)

## 2017-08-22 LAB — CULTURE, RESPIRATORY

## 2017-08-22 LAB — CULTURE, RESPIRATORY W GRAM STAIN: Culture: NORMAL

## 2017-08-22 MED ORDER — CEFUROXIME AXETIL 500 MG PO TABS: 500.0000 mg | ORAL_TABLET | Freq: Two times a day (BID) | ORAL | 0 refills | Status: AC

## 2017-08-22 MED ORDER — OSELTAMIVIR PHOSPHATE 30 MG PO CAPS: 30.0000 mg | ORAL_CAPSULE | Freq: Two times a day (BID) | ORAL | 0 refills | Status: AC

## 2017-08-22 NOTE — Progress Notes (Signed)
   Subjective: Patient is doing well this AM. He feels that his breathing and cough have improved. He does continue to have some sputum production. Overall he feels like he is ready to go home. I discussed the plan with the patient's son who agrees with the plan. The patient does have oxygen at home as needed. All questions and concerns addressed.   Objective: Vital signs in last 24 hours: Vitals:   08/21/17 1500 08/21/17 1934 08/21/17 2146 08/22/17 0522  BP:   (!) 108/59 (!) 112/54  Pulse:   68 60  Resp:   17 17  Temp:   97.8 F (36.6 C) 97.8 F (36.6 C)  TempSrc:   Oral Oral  SpO2: 91% 92% 96% 99%  Weight:      Height:       General: Well nourished male in no acute distress Pulm: Good air movement with no wheezing or crackles  CV: RRR, no murmurs, no rubs  Abdomen: Active bowel sounds, soft, non-distended, no tenderness to palpation  Extremities: No lower extremity edema    Assessment/Plan:  Bloxham is an 84 year old male with COPD, type 2 diabetes mellitus, CKD stage III, heart failure reduced ejection fraction status post AICD placement, and achalasia who presented to the ED with 1 day of chills.Subsequent workup illustrated positive influenza A testing. Today he feels well and feels his breathing/cough have improved.Spoke with the family today they are comfortable with the plan to discharge today. Further management as outlined below.  Influenza A / CAP. -Patient presented with acute onset cough and rigor  - Afebrile over the past 24 hours -Continuing Tamiflu and Rocephin (will transition to Ceftin on discharge). -No indications for steroids at this time. -Pulmonology recommending outpatient treatment for 7 days  Dementia - On Namenda and Aricept - On delirium precautions   Achalasia -History of recurrent aspiration secondary to achalasia confirmedby barium swallow. He has had esophageal dilation in the past. -Continue regular  diet  HFrEF -Echocardiogram in September 2017 showing ejection fraction of 20-25% with grade 1 diastolic dysfunction, left atrial enlargement, and normal right-sided function. -AICD placed -Current therapy includes Lasix 20 mg daily -Not on a beta-blocker due to intolerance, and not on an ACE inhibitor or anARBdue to CKD  CAD -Myocardial infarction in 1994 with emergent angioplasty of the LAD. Recent cardiac catheterization in 2007 showing nonobstructive disease. -Stress test in 2011 showed no signs of ischemia. -Will hold nitro patch due to hypotension.  COPD -Home inhalers including Symbicort and Spiriva.  CKD Stage III -Stable at this point.  Type 2 DM -Home therapy includesBasaglar16 units every morning and NovoLog 5-7 units 3 times daily with meals - Lantus 12 units QHS and SSI TID with meals  Dispo: Anticipated discharge today.   Ina Homes, MD 08/22/2017, 6:03 AM My Pager: [phone]

## 2017-08-22 NOTE — Progress Notes (Signed)
Patient was given discharge instructions and verbalized understanding. Patient aware of follow up appointments and medication changes. Patient left unit with nursing staff via wheelchair in stable condition with all of belongings.

## 2017-08-24 ENCOUNTER — Other Ambulatory Visit: Payer: Self-pay | Admitting: Gastroenterology

## 2017-08-24 DIAGNOSIS — E1151 Type 2 diabetes mellitus with diabetic peripheral angiopathy without gangrene: Secondary | ICD-10-CM | POA: Diagnosis not present

## 2017-08-24 DIAGNOSIS — L84 Corns and callosities: Secondary | ICD-10-CM | POA: Diagnosis not present

## 2017-08-24 DIAGNOSIS — L603 Nail dystrophy: Secondary | ICD-10-CM | POA: Diagnosis not present

## 2017-08-24 DIAGNOSIS — I739 Peripheral vascular disease, unspecified: Secondary | ICD-10-CM | POA: Diagnosis not present

## 2017-08-24 LAB — CULTURE, BLOOD (ROUTINE X 2)
Culture: NO GROWTH
Culture: NO GROWTH
SPECIAL REQUESTS: ADEQUATE
Special Requests: ADEQUATE

## 2017-08-28 ENCOUNTER — Telehealth: Payer: Self-pay | Admitting: Cardiology

## 2017-08-28 ENCOUNTER — Ambulatory Visit (INDEPENDENT_AMBULATORY_CARE_PROVIDER_SITE_OTHER): Payer: Medicare Other | Admitting: *Deleted

## 2017-08-28 DIAGNOSIS — I255 Ischemic cardiomyopathy: Secondary | ICD-10-CM | POA: Diagnosis not present

## 2017-08-28 NOTE — Telephone Encounter (Signed)
Spoke with pt and reminded pt of remote transmission that is due today. Pt verbalized understanding.   

## 2017-08-29 NOTE — Progress Notes (Signed)
Remote ICD transmission.   

## 2017-08-30 ENCOUNTER — Encounter: Payer: Self-pay | Admitting: Cardiology

## 2017-08-30 LAB — CUP PACEART REMOTE DEVICE CHECK
Battery Remaining Longevity: 31 mo
Battery Remaining Percentage: 35 %
Brady Statistic RA Percent Paced: 34 %
Brady Statistic RV Percent Paced: 1.2 %
Date Time Interrogation Session: 20190130174839
HIGH POWER IMPEDANCE MEASURED VALUE: 77 Ohm
Implantable Lead Implant Date: 20120829
Implantable Lead Location: 753860
Lead Channel Sensing Intrinsic Amplitude: 1.5 mV
Lead Channel Setting Pacing Amplitude: 2 V
Lead Channel Setting Sensing Sensitivity: 0.5 mV
MDC IDC LEAD IMPLANT DT: 20120829
MDC IDC LEAD LOCATION: 753859
MDC IDC MSMT LEADCHNL RA IMPEDANCE VALUE: 400 Ohm
MDC IDC MSMT LEADCHNL RV IMPEDANCE VALUE: 390 Ohm
MDC IDC PG IMPLANT DT: 20120829
MDC IDC PG SERIAL: 828604
MDC IDC SET LEADCHNL RV PACING AMPLITUDE: 2.5 V
MDC IDC SET LEADCHNL RV PACING PULSEWIDTH: 0.5 ms

## 2017-09-27 ENCOUNTER — Encounter: Payer: Self-pay | Admitting: Pulmonary Disease

## 2017-09-27 ENCOUNTER — Ambulatory Visit (INDEPENDENT_AMBULATORY_CARE_PROVIDER_SITE_OTHER): Payer: Medicare Other | Admitting: Pulmonary Disease

## 2017-09-27 ENCOUNTER — Ambulatory Visit (INDEPENDENT_AMBULATORY_CARE_PROVIDER_SITE_OTHER)
Admission: RE | Admit: 2017-09-27 | Discharge: 2017-09-27 | Disposition: A | Discharge status: Home / Self Care | Payer: Medicare Other | Source: Ambulatory Visit | Attending: Pulmonary Disease | Admitting: Pulmonary Disease

## 2017-09-27 ENCOUNTER — Ambulatory Visit: Payer: Medicare Other | Admitting: Pulmonary Disease

## 2017-09-27 VITALS — BP 102/60 | HR 81 | Ht 68.0 in | Wt 161.2 lb

## 2017-09-27 DIAGNOSIS — J441 Chronic obstructive pulmonary disease with (acute) exacerbation: Secondary | ICD-10-CM

## 2017-09-27 DIAGNOSIS — J449 Chronic obstructive pulmonary disease, unspecified: Secondary | ICD-10-CM

## 2017-09-27 DIAGNOSIS — J189 Pneumonia, unspecified organism: Secondary | ICD-10-CM | POA: Diagnosis not present

## 2017-09-27 DIAGNOSIS — J181 Lobar pneumonia, unspecified organism: Secondary | ICD-10-CM

## 2017-09-27 DIAGNOSIS — I255 Ischemic cardiomyopathy: Secondary | ICD-10-CM

## 2017-09-27 NOTE — Patient Instructions (Signed)
Recent pneumonia: We will get a chest x-ray today to make sure these findings have cleared up  Severe COPD with recurrent exacerbations: Continue Spiriva Continue Symbicort Use albuterol as needed for chest tightness wheezing or shortness of breath Continue to practice good hand hygiene this time of year  We will see you back in 2 months or sooner if needed

## 2017-09-27 NOTE — Progress Notes (Signed)
Subjective:    Patient ID: Antonio Ray, male    DOB: [birth year], 84 y.o.   MRN: [id number]  Synopsis: First the [facility] in 2015 for severe COPD. His FEV1 at that time was 29% predicted. He also has a scar in his right lung base believed to be related to pneumonia. He also has a history of achalasia and was admitted for healthcare associated pneumonia multiple times in 2016. He underwent Botox injections in his distal esophagus in late 2016.  HPI  Chief Complaint  Patient presents with  . Follow-up    Overall improved since last visit here with NP. He has been coughing up green to brown sputum for the past month.  He has not had to use his albuterol inhaler since last visit.    Antonio Ray had the flu in January and ended up in the hospital.  He had been to the doctor's office prior to the hospitalization and went out to lunch.  He thinks that is how he caught the flu.    Since then he has been doing OK.  He ha a little cough and some congestion but it's not too bad.  His oxygen level has been OK on spot checks at home.    Past Medical History:  Diagnosis Date  . Achalasia   . AICD (automatic cardioverter/defibrillator) present 03/30/2011   Analyze ST study patient  . Anginal pain ([place])   . BPH (benign prostatic hyperplasia)   . CHF (congestive heart failure) ([place])   . Chronic systolic dysfunction of left ventricle    EF 30-35%, CLASS II - III SYMPTOMS; intolerant to Coreg  . CKD (chronic kidney disease) stage 3, GFR 30-59 ml/min ([name]) 06/08/2012  . COPD (chronic obstructive pulmonary disease) ([place])   . Coronary artery disease    History of remote anterior MI with PCI to LAD in 2006; most recent cath 2007, no intervention required  . DOE (dyspnea on exertion)    with heavy exertion  . Dysrhythmia   . Esophageal dysmotility   . GERD (gastroesophageal reflux disease)   . GERD (gastroesophageal reflux disease)   . Head injury, closed, with concussion 2000ish  .  Heart murmur    hx  . HOH (hard of hearing)   . Hyperlipidemia   . Memory loss    improved  . Myocardial infarction ([place]) 1994   ANTERIOR  . Pneumonia "several times"   aspiration pna with at least 3 admits for this 2016.   Marland Kitchen PVC's (premature ventricular contractions)   . Renal failure   . Skin Ray "several"   "forearms; head"  . Type II diabetes mellitus ([place])      Review of Systems  Constitutional: Negative for chills, fatigue and fever.  HENT: Negative for postnasal drip, rhinorrhea and sinus pressure.   Respiratory: Positive for cough. Negative for chest tightness, shortness of breath and wheezing.   Cardiovascular: Negative for chest pain, palpitations and leg swelling.       Objective:   Physical Exam  Vitals:   09/27/17 1152  BP: 102/60  Pulse: 81  SpO2: 96%  Weight: 161 lb 3.2 oz (73.1 kg)  Height: 5\' 8"  (1.727 m)  RA  Gen: well appearing HENT: OP clear, TM's clear, neck supple PULM: Few wheezes bases, minimal, good air movement, few crackles R LL, normal percussion CV: RRR, no mgr, trace edema GI: BS+, soft, nontender Derm: no cyanosis or rash Psyche: normal mood and affect   January 2019 hospital  discharge records reviewed.  He was hospitalized for pneumonia and a COPD exacerbation in the setting of a diagnosis of influenza A.  He was treated with prednisone, Ceftin, and Tamiflu.  Chest imaging: January 2019 chest x-ray images independently reviewed showing nodular opacities in the right lung.    Assessment & Plan:  Community acquired pneumonia of right lower lobe of lung ([place]) - Plan: DG Chest 2 View  COPD exacerbation ([place])  COPD GOLD IV  Discussion: He had influenza and right lower lobe pneumonia again in.  Since then he is made a good recovery.  From a symptomatic standpoint he seems to be doing well.  Considering his severe COPD and recurrent acute respiratory episodes I would typically normally think about daily azithromycin or  Daliresp.  However, in his case because we have identifiable causes (recurrent aspiration or most recently influenza) I do not think the addition of either of these medicines would be helpful.  Plan: Recent pneumonia: We will get a chest x-ray today to make sure these findings have cleared up  Severe COPD with recurrent exacerbations: Continue Spiriva Continue Symbicort Use albuterol as needed for chest tightness wheezing or shortness of breath Continue to practice good hand hygiene this time of year  We will see you back in 2 months or sooner if needed    Current Outpatient Medications:  .  albuterol (PROVENTIL HFA;VENTOLIN HFA) 108 (90 Base) MCG/ACT inhaler, Inhale 2 puffs into the lungs every 6 (six) hours as needed for wheezing or shortness of breath., Disp: 1 Inhaler, Rfl: 2 .  aspirin 81 MG EC tablet, Take 81 mg by mouth daily.  , Disp: , Rfl:  .  Cholecalciferol (VITAMIN D) 2000 units tablet, Take 2,000 Units by mouth daily. , Disp: , Rfl:  .  donepezil (ARICEPT) 10 MG tablet, Take 1 tablet (10 mg total) by mouth every morning., Disp: 90 tablet, Rfl: 4 .  fenofibrate (TRICOR) 145 MG tablet, TAKE 1 TABLET BY MOUTH EVERY DAY (Patient taking differently: Take 145 mg by mouth once a day), Disp: 90 tablet, Rfl: 3 .  finasteride (PROSCAR) 5 MG tablet, Take 5 mg by mouth daily., Disp: , Rfl: 0 .  furosemide (LASIX) 20 MG tablet, Take 2 tablets (40 mg total) by mouth daily. (Patient taking differently: Take 20 mg by mouth daily. ), Disp: 180 tablet, Rfl: 3 .  insulin aspart (NOVOLOG FLEXPEN) 100 UNIT/ML FlexPen, Inject 5-6 Units into the skin See admin instructions. (Patient taking differently: Inject 0-6 Units into the skin 3 (three) times daily as needed for high blood sugar. ), Disp: 5 pen, Rfl: 11 .  Insulin Glargine (BASAGLAR KWIKPEN) 100 UNIT/ML SOPN, INJECT 16 UNITS UNDER THE SKIN EVERY MORNING (Patient taking differently: Inject 12 Units into the skin every morning. ), Disp: 15 mL,  Rfl: 11 .  loratadine (CLARITIN) 10 MG tablet, Take 1 tablet (10 mg total) by mouth daily., Disp: 30 tablet, Rfl: 1 .  memantine (NAMENDA) 10 MG tablet, Take 1 tablet (10 mg total) by mouth 2 (two) times daily., Disp: 180 tablet, Rfl: 4 .  Multiple Vitamins-Minerals (PRESERVISION AREDS 2) CAPS, Take 1 capsule by mouth 2 (two) times daily., Disp: , Rfl:  .  nitroGLYCERIN (NITRODUR - DOSED IN MG/24 HR) 0.2 mg/hr patch, Place 1 patch (0.2 mg total) onto the skin daily. Alternates patch placement with odd/even days., Disp: 30 patch, Rfl: 3 .  pantoprazole (PROTONIX) 20 MG tablet, TAKE 1 TABLET BY MOUTH DAILY, Disp: 90 tablet, Rfl: 0 .  pravastatin (PRAVACHOL) 40 MG tablet, Take 1 tablet (40 mg total) by mouth at bedtime., Disp: 90 tablet, Rfl: 3 .  SPIRIVA HANDIHALER 18 MCG inhalation capsule, INHALE CONTENTS OF 1 CAPSULE VIA HANDIHALER ONCE DAILY, Disp: 30 capsule, Rfl: 5 .  SYMBICORT 160-4.5 MCG/ACT inhaler, INHALE 2 PUFFS BY MOUTH TWICE DAILY, Disp: 10.2 g, Rfl: 4

## 2017-09-29 ENCOUNTER — Other Ambulatory Visit: Payer: Self-pay | Admitting: Pulmonary Disease

## 2017-09-29 DIAGNOSIS — R918 Other nonspecific abnormal finding of lung field: Secondary | ICD-10-CM

## 2017-10-02 ENCOUNTER — Ambulatory Visit (INDEPENDENT_AMBULATORY_CARE_PROVIDER_SITE_OTHER): Payer: Medicare Other | Admitting: Internal Medicine

## 2017-10-02 ENCOUNTER — Encounter: Payer: Self-pay | Admitting: Internal Medicine

## 2017-10-02 VITALS — BP 100/62 | HR 81 | Ht 68.0 in | Wt 161.0 lb

## 2017-10-02 DIAGNOSIS — I255 Ischemic cardiomyopathy: Secondary | ICD-10-CM

## 2017-10-02 DIAGNOSIS — Z794 Long term (current) use of insulin: Secondary | ICD-10-CM | POA: Diagnosis not present

## 2017-10-02 DIAGNOSIS — N183 Chronic kidney disease, stage 3 (moderate): Secondary | ICD-10-CM | POA: Diagnosis not present

## 2017-10-02 DIAGNOSIS — E1122 Type 2 diabetes mellitus with diabetic chronic kidney disease: Secondary | ICD-10-CM | POA: Diagnosis not present

## 2017-10-02 LAB — POCT GLYCOSYLATED HEMOGLOBIN (HGB A1C): Hemoglobin A1C: 6.9

## 2017-10-02 MED ORDER — INSULIN ASPART 100 UNIT/ML FLEXPEN: 5.0000 [IU] | PEN_INJECTOR | SUBCUTANEOUS | 11 refills | Status: DC

## 2017-10-02 MED ORDER — BASAGLAR KWIKPEN 100 UNIT/ML ~~LOC~~ SOPN: PEN_INJECTOR | SUBCUTANEOUS | 11 refills | Status: DC

## 2017-10-02 NOTE — Patient Instructions (Addendum)
Please continue: - Basaglar 10 units in am. - Novolog 4-6 units before meals  Please return in 4 months with your sugar log.

## 2017-10-02 NOTE — Progress Notes (Signed)
Patient ID: LEXTON HIDALGO, male   DOB: 01-11-1934, 84 y.o.   MRN: [id number]   HPI: Antonio Ray Ray is a 84 y.o.-year-old male, returning for f/u for DM2, dx in 2010, insulin-dependent since ~2012, uncontrolled, with complications (PN, CKD stage 3-4, CAD, CHF - ICD). He is here with his son who offers most of the history, especially regarding blood sugar checks and insulin dosing. Last visit 4 mo ago.  At last visit, he was having smaller meals due to his achalasia so we decreased his NovoLog and Basaglar doses even more.  Sine last visit, he had a URI beginning of 08/2017 >> had ABx, steroids >> sugars higher. He recovered, but then developed the flu later in the month.  Last hemoglobin A1c was: Lab Results  Component Value Date   HGBA1C 6.1 06/01/2017   HGBA1C 6.9 03/01/2017   HGBA1C 7.9 (H) 11/11/2016   He had a lot of steroids for COPD. He had a severe PNA episode in 2016 2/2 aspiration 2/2 achalasia. He had several admissions >> on repeated Solumedrol doses. Since then >> occasional COPD exacerbations >> gets Solu-Medrol + prednisone tapers.  He is on: - Basaglar 20 units in am >> 18 units >> 16 >> 10 units - Novolog (10-15 min before a meal): 5-6 units He adds 1-2 units to Novolog to the mealtime insulin if on steroids.  Pt checks his sugars 3 times a day: - am:93-148, 151, 217 >> 76-124, 138 >> 76, 84-127 - 2h after b'fast: n/c >> 151-246 >> n/c - before lunch: 112, 133-173, 190, 226 >> 87-154, 177 >> 139-188, 216 (missed am Novolog dose) - 2h after lunch: n/c >> 93, 106-180, 233 >> n/c - before dinner:92-259 (prednisone) >> 68-152, 166, 223, 253 >> 95-163, 184 - 2h after dinner: n/c >> 107-176, 211, 240 >> n/c - bedtime: n/c >> 114-187 >> n/c - nighttime: n/c Lowest sugar was 69 >> 76. Highest sugar was 253 >> 200s (steroids).  Glucometer: Freestyle Lite  Pt's meals are: - Breakfast: bacon, eggs, hash browns - Lunch: sandwich or soup - Dinner: varies, meat + veggies -  Snacks: 1-2  He still walks daily.  - He has CKD, last BUN/creatinine:  Lab Results  Component Value Date   BUN 32 (H) 08/22/2017   BUN 33 (H) 08/21/2017   CREATININE 1.44 (H) 08/22/2017   CREATININE 1.55 (H) 08/21/2017   - + HL; last set of lipids: 02/05/2016: 144/223/40/59 Lab Results  Component Value Date   CHOL 136 06/20/2013   HDL 32.00 (L) 06/20/2013   LDLCALC 76 06/20/2013   TRIG 142.0 06/20/2013   CHOLHDL 4 06/20/2013  On pravastatin. - last eye exam was in 03/2017: reportedly no DR. Dr. Marica Otter. Had cataract Sx in 2016.  He has macular degeneration - He denies numbness and tingling in his feet.  Has a foot ulcer 11/2014 >> healed well (2016). He has DM shoes. R foot more swollen after his fx >> 20 years ago.  He sees a podiatrist.  ROS: Constitutional: no weight gain/no weight loss, no fatigue, no subjective hyperthermia, no subjective hypothermia Eyes: no blurry vision, no xerophthalmia ENT: no sore throat, no nodules palpated in throat, no dysphagia, no odynophagia, no hoarseness Cardiovascular: no CP/no SOB/no palpitations/no leg swelling Respiratory: no cough/no SOB/no wheezing Gastrointestinal: no N/no V/no D/no C/no acid reflux Musculoskeletal: no muscle aches/no joint aches Skin: no rashes, no hair loss Neurological: no tremors/no numbness/no tingling/no dizziness  I reviewed pt's medications, allergies, PMH,  social hx, family hx, and changes were documented in the history of present illness. Otherwise, unchanged from my initial visit note. Lasix dose decreased from 40 to 20 mg daily.  Past Medical History:  Diagnosis Date  . Achalasia   . AICD (automatic cardioverter/defibrillator) present 03/30/2011   Analyze ST study patient  . Anginal pain ([place])   . BPH (benign prostatic hyperplasia)   . CHF (congestive heart failure) ([place])   . Chronic systolic dysfunction of left ventricle    EF 30-35%, CLASS II - III SYMPTOMS; intolerant to Coreg  . CKD  (chronic kidney disease) stage 3, GFR 30-59 ml/min ([name]) 06/08/2012  . COPD (chronic obstructive pulmonary disease) ([place])   . Coronary artery disease    History of remote anterior MI with PCI to LAD in 2006; most recent cath 2007, no intervention required  . DOE (dyspnea on exertion)    with heavy exertion  . Dysrhythmia   . Esophageal dysmotility   . GERD (gastroesophageal reflux disease)   . GERD (gastroesophageal reflux disease)   . Head injury, closed, with concussion 2000ish  . Heart murmur    hx  . HOH (hard of hearing)   . Hyperlipidemia   . Memory loss    improved  . Myocardial infarction ([place]) 1994   ANTERIOR  . Pneumonia "several times"   aspiration pna with at least 3 admits for this 2016.   Marland Kitchen PVC's (premature ventricular contractions)   . Renal failure   . Skin cancer "several"   "forearms; head"  . Type II diabetes mellitus ([place])    Past Surgical History:  Procedure Laterality Date  . BALLOON DILATION N/A 09/09/2015   Procedure: BALLOON DILATION;  Surgeon: Mauri Pole, MD;  Location: [facility];  Service: Endoscopy;  Laterality: N/A;  rigiflex achalasia balloon dilators  . BOTOX INJECTION N/A 04/08/2015   Procedure: BOTOX INJECTION;  Surgeon: Milus Banister, MD;  Location: [facility];  Service: Endoscopy;  Laterality: N/A;  . CARDIAC CATHETERIZATION  01/11/2006   DEMONSTRATES AKINESIA OF THE DISTAL ANTERIOR WALL, DISTAL INFERIOR WALL AND AKINESIA OF THE APEX. THE BASAL SEGMENTS CONTRACT WELL AND OVERALL EF 35%  . CATARACT EXTRACTION W/ INTRAOCULAR LENS  IMPLANT, BILATERAL  08/2014-09/2014  . CHOLECYSTECTOMY  2/11  . CORONARY ANGIOPLASTY  1994   TO THE LAD  . ESOPHAGEAL MANOMETRY N/A 03/16/2015   Procedure: ESOPHAGEAL MANOMETRY (EM);  Surgeon: Jerene Bears, MD;  Location: [facility];  Service: Gastroenterology;  Laterality: N/A;  . ESOPHAGOGASTRODUODENOSCOPY N/A 04/08/2015   Procedure: ESOPHAGOGASTRODUODENOSCOPY (EGD);  Surgeon: Milus Banister, MD;  Location:  [facility];  Service: Endoscopy;  Laterality: N/A;  . ESOPHAGOGASTRODUODENOSCOPY (EGD) WITH PROPOFOL N/A 09/09/2015   Procedure: ESOPHAGOGASTRODUODENOSCOPY (EGD) WITH PROPOFOL;  Surgeon: Mauri Pole, MD;  Location: [facility];  Service: Endoscopy;  Laterality: N/A;  pt. is to have gastrografin xray post recovery-do not discharge until results are back  . EYE SURGERY    . FOOT FRACTURE SURGERY Right 1980's  . INSERT / REPLACE / REMOVE PACEMAKER    . SKIN CANCER EXCISION  "several"   "forearms, head"   Social History   Socioeconomic History  . Marital status: Widowed    Spouse name: Not on file  . Number of children: 2  . Years of education: GED  . Highest education level: Not on file  Social Needs  . Financial resource strain: Patient refused  . Food insecurity - worry: Never true  . Food insecurity - inability: Never  true  . Transportation needs - medical: No  . Transportation needs - non-medical: No  Occupational History  . Occupation: Clinical biochemist: [employer]    Comment: Retired  . Occupation: [employer]    Employer: [employer]    Comment: Retired  Tobacco Use  . Smoking status: Former Smoker    Packs/day: 1.00    Years: 35.00    Pack years: 35.00    Types: Cigarettes    Last attempt to quit: 08/01/1992    Years since quitting: 25.1  . Smokeless tobacco: Former Systems developer    Quit date: 1994  Substance and Sexual Activity  . Alcohol use: No    Alcohol/week: 0.0 oz  . Drug use: No  . Sexual activity: Not Currently  Other Topics Concern  . Not on file  Social History Narrative   Pt lives in [place] alone.  Widowed.   Right-handed.   Rare caffeine use - maybe one cup per month.   Current Outpatient Medications on File Prior to Visit  Medication Sig Dispense Refill  . albuterol (PROVENTIL HFA;VENTOLIN HFA) 108 (90 Base) MCG/ACT inhaler Inhale 2 puffs into the lungs every 6 (six) hours as needed for wheezing or shortness of breath. 1  Inhaler 2  . aspirin 81 MG EC tablet Take 81 mg by mouth daily.      . Cholecalciferol (VITAMIN D) 2000 units tablet Take 2,000 Units by mouth daily.     Marland Kitchen donepezil (ARICEPT) 10 MG tablet Take 1 tablet (10 mg total) by mouth every morning. 90 tablet 4  . fenofibrate (TRICOR) 145 MG tablet TAKE 1 TABLET BY MOUTH EVERY DAY (Patient taking differently: Take 145 mg by mouth once a day) 90 tablet 3  . finasteride (PROSCAR) 5 MG tablet Take 5 mg by mouth daily.  0  . furosemide (LASIX) 20 MG tablet Take 2 tablets (40 mg total) by mouth daily. (Patient taking differently: Take 20 mg by mouth daily. ) 180 tablet 3  . insulin aspart (NOVOLOG FLEXPEN) 100 UNIT/ML FlexPen Inject 5-6 Units into the skin See admin instructions. (Patient taking differently: Inject 0-6 Units into the skin 3 (three) times daily as needed for high blood sugar. ) 5 pen 11  . Insulin Glargine (BASAGLAR KWIKPEN) 100 UNIT/ML SOPN INJECT 16 UNITS UNDER THE SKIN EVERY MORNING (Patient taking differently: Inject 12 Units into the skin every morning. ) 15 mL 11  . loratadine (CLARITIN) 10 MG tablet Take 1 tablet (10 mg total) by mouth daily. 30 tablet 1  . memantine (NAMENDA) 10 MG tablet Take 1 tablet (10 mg total) by mouth 2 (two) times daily. 180 tablet 4  . Multiple Vitamins-Minerals (PRESERVISION AREDS 2) CAPS Take 1 capsule by mouth 2 (two) times daily.    . nitroGLYCERIN (NITRODUR - DOSED IN MG/24 HR) 0.2 mg/hr patch Place 1 patch (0.2 mg total) onto the skin daily. Alternates patch placement with odd/even days. 30 patch 3  . pantoprazole (PROTONIX) 20 MG tablet TAKE 1 TABLET BY MOUTH DAILY 90 tablet 0  . pravastatin (PRAVACHOL) 40 MG tablet Take 1 tablet (40 mg total) by mouth at bedtime. 90 tablet 3  . SPIRIVA HANDIHALER 18 MCG inhalation capsule INHALE CONTENTS OF 1 CAPSULE VIA HANDIHALER ONCE DAILY 30 capsule 5  . SYMBICORT 160-4.5 MCG/ACT inhaler INHALE 2 PUFFS BY MOUTH TWICE DAILY 10.2 g 4   No current facility-administered  medications on file prior to visit.    Allergies  Allergen Reactions  .  Codeine Other (See Comments)    Gets very angry, disoriented  . Dilaudid [Hydromorphone Hcl] Other (See Comments)    VERY AGITATED, HOSTILE  . Flomax [Tamsulosin Hcl] Shortness Of Breath  . Morphine And Related Other (See Comments)    VERY AGITATED, HOSTILE  . Sulfa Antibiotics Shortness Of Breath  . Beta Adrenergic Blockers Other (See Comments)    Disorientation  . Carvedilol Other (See Comments)    DISORIENTATION   Family History  Problem Relation Age of Onset  . Stroke Father        Mother history unknown - never met her  . Colon cancer Neg Hx     PE: BP 100/62   Pulse 81   Ht _0  (1.727 m)   Wt 161 lb (73 kg)   SpO2 96%   BMI 24.48 kg/m  Wt Readings from Last 3 Encounters:  10/02/17 161 lb (73 kg)  09/27/17 161 lb 3.2 oz (73.1 kg)  08/22/17 136 lb 14.5 oz (62.1 kg)   Constitutional: overweight, in NAD Eyes: PERRLA, EOMI, no exophthalmos ENT: moist mucous membranes, no thyromegaly, no cervical lymphadenopathy Cardiovascular: RRR, No MRG Respiratory: CTA B Gastrointestinal: abdomen soft, NT, ND, BS+ Musculoskeletal: no deformities, strength intact in all 4 Skin: moist, warm, no rashes Neurological: no tremor with outstretched hands, DTR normal in all 4  ASSESSMENT: 1. DM2, insulin-dependent, uncontrolled, with complications - PN - stable - CKD stage 3-4  - CAD, CHF - ICD  2. HL  PLAN:  1. Patient with long-standing, uncontrolled, diabetes, with improvement of control on his basal-bolus insulin regimen, so we could decrease his insulin doses.  He also decreased his food portions, so at last visit, his HbA1c was wonderful, at 6.1%.  His sugars were mostly at goal, and even occasionally low, therefore, we decrease his Basaglar dose even more than. - sugars are still very good with few hyperglycemic values during his illness in 08/2017 and also when his sugars are lower in am so he does  not take the full Novolog dose  Or skips it completely >> discussed to take 1/2 of the recommended dose, not less - OTW, no changes needed in his regimen - I suggested to:  Patient Instructions  Please continue: - Basaglar 10 units in am. - Novolog 4-6 units before meals  Please return in 4 months with your sugar log.    - today, HbA1c is 6.9% (higher, but still at goal) - continue checking sugars at different times of the day - check 3x a day, rotating checks - advised for yearly eye exams >> he is UTD - Return to clinic in 4 mo with sugar log    2. HL -Reviewed latest lipid panel from 2017: LDL at goal, but triglycerides high -He continues on Lipitor without side effects he continues on Lipitor - no SEs -Need to obtain lipid panel from PCP >> he has an appt in 10/2017   Philemon Kingdom, MD PhD [facility] Endocrinology

## 2017-10-03 ENCOUNTER — Other Ambulatory Visit: Payer: Self-pay | Admitting: Gastroenterology

## 2017-10-04 ENCOUNTER — Ambulatory Visit (INDEPENDENT_AMBULATORY_CARE_PROVIDER_SITE_OTHER)
Admission: RE | Admit: 2017-10-04 | Discharge: 2017-10-04 | Disposition: A | Discharge status: Home / Self Care | Payer: Medicare Other | Source: Ambulatory Visit | Attending: Pulmonary Disease | Admitting: Pulmonary Disease

## 2017-10-04 DIAGNOSIS — R918 Other nonspecific abnormal finding of lung field: Secondary | ICD-10-CM

## 2017-10-05 ENCOUNTER — Telehealth: Payer: Self-pay | Admitting: Pulmonary Disease

## 2017-10-05 NOTE — Telephone Encounter (Signed)
This can wait until Dr Lake Bells is able to address it. I am forwarding to him.

## 2017-10-05 NOTE — Telephone Encounter (Signed)
Received call from [facility] at [place] for call report for CT chest. Pt had CT chest on 3.6.2019. Pt is of Dr. Anastasia Pall - will send to doc of day as BQ is unavailable until 3.11.2019. Impression is below.    IMPRESSION: Multifocal tree-in-bud nodularity, right lung predominant, suggesting infectious bronchiolitis. Chronic atypical mycobacterial infection remains possible. Technically speaking, in the appropriate clinical setting, TB could have this appearance.

## 2017-10-06 NOTE — Telephone Encounter (Signed)
Antonio Ray (son) CB [phone] is calling for CT results.  Advised him that Dr. Lake Bells is not in the office and will be back on Monday.

## 2017-10-09 NOTE — Telephone Encounter (Signed)
Sent result note to [place] this morning.

## 2017-10-09 NOTE — Telephone Encounter (Signed)
A, Please let the patient know this showed evidence of his prior aspiration pneumonia. We will discuss more on the next visit. Thanks, B  Spoke with patient's son [name]. He is aware of the results. He is also aware of the follow up appt for April 18th at 1015. Nothing else needed at time of call.

## 2017-10-13 ENCOUNTER — Encounter (HOSPITAL_COMMUNITY): Payer: Self-pay

## 2017-10-13 ENCOUNTER — Other Ambulatory Visit: Payer: Self-pay

## 2017-10-13 ENCOUNTER — Inpatient Hospital Stay (HOSPITAL_COMMUNITY)
Admission: EM | Admit: 2017-10-13 | Discharge: 2017-10-16 | DRG: 177 | Disposition: A | Discharge status: Home / Self Care | Payer: Medicare Other | Attending: Internal Medicine | Admitting: Internal Medicine

## 2017-10-13 ENCOUNTER — Telehealth: Payer: Self-pay | Admitting: Pulmonary Disease

## 2017-10-13 ENCOUNTER — Emergency Department (HOSPITAL_COMMUNITY): Payer: Medicare Other

## 2017-10-13 DIAGNOSIS — Z87891 Personal history of nicotine dependence: Secondary | ICD-10-CM

## 2017-10-13 DIAGNOSIS — I255 Ischemic cardiomyopathy: Secondary | ICD-10-CM | POA: Diagnosis not present

## 2017-10-13 DIAGNOSIS — J449 Chronic obstructive pulmonary disease, unspecified: Secondary | ICD-10-CM | POA: Diagnosis present

## 2017-10-13 DIAGNOSIS — Z9842 Cataract extraction status, left eye: Secondary | ICD-10-CM

## 2017-10-13 DIAGNOSIS — R609 Edema, unspecified: Secondary | ICD-10-CM | POA: Diagnosis not present

## 2017-10-13 DIAGNOSIS — Z9861 Coronary angioplasty status: Secondary | ICD-10-CM

## 2017-10-13 DIAGNOSIS — I493 Ventricular premature depolarization: Secondary | ICD-10-CM | POA: Diagnosis present

## 2017-10-13 DIAGNOSIS — Z7982 Long term (current) use of aspirin: Secondary | ICD-10-CM

## 2017-10-13 DIAGNOSIS — R1312 Dysphagia, oropharyngeal phase: Secondary | ICD-10-CM | POA: Diagnosis present

## 2017-10-13 DIAGNOSIS — Z961 Presence of intraocular lens: Secondary | ICD-10-CM | POA: Diagnosis present

## 2017-10-13 DIAGNOSIS — K219 Gastro-esophageal reflux disease without esophagitis: Secondary | ICD-10-CM | POA: Diagnosis not present

## 2017-10-13 DIAGNOSIS — I5043 Acute on chronic combined systolic (congestive) and diastolic (congestive) heart failure: Secondary | ICD-10-CM

## 2017-10-13 DIAGNOSIS — N183 Chronic kidney disease, stage 3 unspecified: Secondary | ICD-10-CM | POA: Diagnosis present

## 2017-10-13 DIAGNOSIS — J9621 Acute and chronic respiratory failure with hypoxia: Secondary | ICD-10-CM | POA: Diagnosis present

## 2017-10-13 DIAGNOSIS — R0902 Hypoxemia: Secondary | ICD-10-CM | POA: Diagnosis not present

## 2017-10-13 DIAGNOSIS — J440 Chronic obstructive pulmonary disease with acute lower respiratory infection: Secondary | ICD-10-CM | POA: Diagnosis not present

## 2017-10-13 DIAGNOSIS — M79609 Pain in unspecified limb: Secondary | ICD-10-CM | POA: Diagnosis not present

## 2017-10-13 DIAGNOSIS — E1122 Type 2 diabetes mellitus with diabetic chronic kidney disease: Secondary | ICD-10-CM | POA: Diagnosis present

## 2017-10-13 DIAGNOSIS — R0602 Shortness of breath: Secondary | ICD-10-CM | POA: Diagnosis not present

## 2017-10-13 DIAGNOSIS — R0789 Other chest pain: Secondary | ICD-10-CM

## 2017-10-13 DIAGNOSIS — Z7952 Long term (current) use of systemic steroids: Secondary | ICD-10-CM | POA: Diagnosis not present

## 2017-10-13 DIAGNOSIS — J690 Pneumonitis due to inhalation of food and vomit: Secondary | ICD-10-CM | POA: Diagnosis not present

## 2017-10-13 DIAGNOSIS — K224 Dyskinesia of esophagus: Secondary | ICD-10-CM | POA: Diagnosis present

## 2017-10-13 DIAGNOSIS — R072 Precordial pain: Secondary | ICD-10-CM | POA: Diagnosis not present

## 2017-10-13 DIAGNOSIS — I252 Old myocardial infarction: Secondary | ICD-10-CM

## 2017-10-13 DIAGNOSIS — H9190 Unspecified hearing loss, unspecified ear: Secondary | ICD-10-CM

## 2017-10-13 DIAGNOSIS — F0390 Unspecified dementia without behavioral disturbance: Secondary | ICD-10-CM | POA: Diagnosis present

## 2017-10-13 DIAGNOSIS — G3184 Mild cognitive impairment, so stated: Secondary | ICD-10-CM | POA: Diagnosis present

## 2017-10-13 DIAGNOSIS — Z9841 Cataract extraction status, right eye: Secondary | ICD-10-CM | POA: Diagnosis not present

## 2017-10-13 DIAGNOSIS — K220 Achalasia of cardia: Secondary | ICD-10-CM | POA: Diagnosis not present

## 2017-10-13 DIAGNOSIS — I509 Heart failure, unspecified: Secondary | ICD-10-CM | POA: Diagnosis not present

## 2017-10-13 DIAGNOSIS — N179 Acute kidney failure, unspecified: Secondary | ICD-10-CM | POA: Diagnosis not present

## 2017-10-13 DIAGNOSIS — E785 Hyperlipidemia, unspecified: Secondary | ICD-10-CM | POA: Diagnosis present

## 2017-10-13 DIAGNOSIS — I130 Hypertensive heart and chronic kidney disease with heart failure and stage 1 through stage 4 chronic kidney disease, or unspecified chronic kidney disease: Secondary | ICD-10-CM | POA: Diagnosis present

## 2017-10-13 DIAGNOSIS — N400 Enlarged prostate without lower urinary tract symptoms: Secondary | ICD-10-CM | POA: Diagnosis not present

## 2017-10-13 DIAGNOSIS — I2510 Atherosclerotic heart disease of native coronary artery without angina pectoris: Secondary | ICD-10-CM | POA: Diagnosis present

## 2017-10-13 DIAGNOSIS — Z95810 Presence of automatic (implantable) cardiac defibrillator: Secondary | ICD-10-CM | POA: Diagnosis present

## 2017-10-13 DIAGNOSIS — Z79899 Other long term (current) drug therapy: Secondary | ICD-10-CM

## 2017-10-13 DIAGNOSIS — Z794 Long term (current) use of insulin: Secondary | ICD-10-CM | POA: Diagnosis not present

## 2017-10-13 DIAGNOSIS — J441 Chronic obstructive pulmonary disease with (acute) exacerbation: Secondary | ICD-10-CM | POA: Diagnosis not present

## 2017-10-13 DIAGNOSIS — R079 Chest pain, unspecified: Secondary | ICD-10-CM | POA: Diagnosis not present

## 2017-10-13 DIAGNOSIS — Z85828 Personal history of other malignant neoplasm of skin: Secondary | ICD-10-CM

## 2017-10-13 DIAGNOSIS — I495 Sick sinus syndrome: Secondary | ICD-10-CM | POA: Diagnosis present

## 2017-10-13 LAB — CBC
HCT: 41 % (ref 39.0–52.0)
Hemoglobin: 12.7 g/dL — ABNORMAL LOW (ref 13.0–17.0)
MCH: 27.2 pg (ref 26.0–34.0)
MCHC: 31 g/dL (ref 30.0–36.0)
MCV: 87.8 fL (ref 78.0–100.0)
PLATELETS: 250 10*3/uL (ref 150–400)
RBC: 4.67 MIL/uL (ref 4.22–5.81)
RDW: 16.6 % — AB (ref 11.5–15.5)
WBC: 25.1 10*3/uL — ABNORMAL HIGH (ref 4.0–10.5)

## 2017-10-13 LAB — I-STAT TROPONIN, ED: TROPONIN I, POC: 0.03 ng/mL (ref 0.00–0.08)

## 2017-10-13 LAB — COMPREHENSIVE METABOLIC PANEL
ALT: 21 U/L (ref 17–63)
AST: 37 U/L (ref 15–41)
Albumin: 3.2 g/dL — ABNORMAL LOW (ref 3.5–5.0)
Alkaline Phosphatase: 41 U/L (ref 38–126)
Anion gap: 11 (ref 5–15)
BILIRUBIN TOTAL: 0.9 mg/dL (ref 0.3–1.2)
BUN: 41 mg/dL — ABNORMAL HIGH (ref 6–20)
CO2: 25 mmol/L (ref 22–32)
Calcium: 9.2 mg/dL (ref 8.9–10.3)
Chloride: 104 mmol/L (ref 101–111)
Creatinine, Ser: 2.06 mg/dL — ABNORMAL HIGH (ref 0.61–1.24)
GFR, EST AFRICAN AMERICAN: 33 mL/min — AB (ref >60)
GFR, EST NON AFRICAN AMERICAN: 28 mL/min — AB (ref >60)
Glucose, Bld: 89 mg/dL (ref 65–99)
POTASSIUM: 4 mmol/L (ref 3.5–5.1)
Sodium: 140 mmol/L (ref 135–145)
TOTAL PROTEIN: 6.7 g/dL (ref 6.5–8.1)

## 2017-10-13 LAB — PROCALCITONIN: Procalcitonin: 4.46 ng/mL

## 2017-10-13 LAB — BRAIN NATRIURETIC PEPTIDE: B NATRIURETIC PEPTIDE 5: 398.4 pg/mL — AB (ref 0.0–100.0)

## 2017-10-13 LAB — HEMOGLOBIN A1C
HEMOGLOBIN A1C: 6.9 % — AB (ref 4.8–5.6)
Mean Plasma Glucose: 151.33 mg/dL

## 2017-10-13 LAB — LACTIC ACID, PLASMA: Lactic Acid, Venous: 0.9 mmol/L (ref 0.5–1.9)

## 2017-10-13 LAB — CBG MONITORING, ED: GLUCOSE-CAPILLARY: 86 mg/dL (ref 65–99)

## 2017-10-13 LAB — TROPONIN I: TROPONIN I: 0.05 ng/mL — AB (ref <0.03)

## 2017-10-13 MED ORDER — HEPARIN SODIUM (PORCINE) 5000 UNIT/ML IJ SOLN
5000.0000 [IU] | Freq: Three times a day (TID) | INTRAMUSCULAR | Status: DC
  Administered 2017-10-13 – 2017-10-16 (×9): 5000 [IU] via SUBCUTANEOUS
  Filled 2017-10-13 (×8): qty 1

## 2017-10-13 MED ORDER — PIPERACILLIN-TAZOBACTAM 3.375 G IVPB
3.3750 g | Freq: Three times a day (TID) | INTRAVENOUS | Status: DC
  Administered 2017-10-14 – 2017-10-16 (×7): 3.375 g via INTRAVENOUS
  Filled 2017-10-13 (×10): qty 50

## 2017-10-13 MED ORDER — SODIUM CHLORIDE 0.9 % IV SOLN
INTRAVENOUS | Status: DC
  Administered 2017-10-13 – 2017-10-14 (×2): via INTRAVENOUS

## 2017-10-13 MED ORDER — PIPERACILLIN-TAZOBACTAM 3.375 G IVPB 30 MIN
3.3750 g | Freq: Once | INTRAVENOUS | Status: AC
Start: 2017-10-13 — End: 2017-10-13
  Administered 2017-10-13: 3.375 g via INTRAVENOUS
  Filled 2017-10-13: qty 50

## 2017-10-13 MED ORDER — SENNOSIDES-DOCUSATE SODIUM 8.6-50 MG PO TABS: 1.0000 | ORAL_TABLET | Freq: Every evening | ORAL | Status: DC | PRN

## 2017-10-13 MED ORDER — ACETAMINOPHEN 650 MG RE SUPP: 650.0000 mg | Freq: Four times a day (QID) | RECTAL | Status: DC | PRN

## 2017-10-13 MED ORDER — ONDANSETRON HCL 4 MG PO TABS
4.0000 mg | ORAL_TABLET | Freq: Four times a day (QID) | ORAL | Status: DC | PRN
Start: 2017-10-13 — End: 2017-10-16

## 2017-10-13 MED ORDER — PANTOPRAZOLE SODIUM 40 MG IV SOLR
40.0000 mg | INTRAVENOUS | Status: DC
  Administered 2017-10-13 – 2017-10-14 (×2): 40 mg via INTRAVENOUS
  Filled 2017-10-13 (×2): qty 40

## 2017-10-13 MED ORDER — MOMETASONE FURO-FORMOTEROL FUM 200-5 MCG/ACT IN AERO
2.0000 | INHALATION_SPRAY | Freq: Two times a day (BID) | RESPIRATORY_TRACT | Status: DC
  Administered 2017-10-14 – 2017-10-16 (×5): 2 via RESPIRATORY_TRACT
  Filled 2017-10-13: qty 8.8

## 2017-10-13 MED ORDER — BISACODYL 10 MG RE SUPP: 10.0000 mg | Freq: Every day | RECTAL | Status: DC | PRN

## 2017-10-13 MED ORDER — ONDANSETRON HCL 4 MG/2ML IJ SOLN: 4.0000 mg | Freq: Four times a day (QID) | INTRAMUSCULAR | Status: DC | PRN

## 2017-10-13 MED ORDER — TIOTROPIUM BROMIDE MONOHYDRATE 18 MCG IN CAPS
18.0000 ug | ORAL_CAPSULE | Freq: Every day | RESPIRATORY_TRACT | Status: DC
  Administered 2017-10-14 – 2017-10-16 (×3): 18 ug via RESPIRATORY_TRACT
  Filled 2017-10-13: qty 5

## 2017-10-13 MED ORDER — ACETAMINOPHEN 325 MG PO TABS: 650.0000 mg | ORAL_TABLET | Freq: Four times a day (QID) | ORAL | Status: DC | PRN

## 2017-10-13 MED ORDER — FUROSEMIDE 10 MG/ML IJ SOLN
60.0000 mg | Freq: Once | INTRAMUSCULAR | Status: DC
  Filled 2017-10-13: qty 6

## 2017-10-13 MED ORDER — INSULIN ASPART 100 UNIT/ML ~~LOC~~ SOLN
0.0000 [IU] | Freq: Three times a day (TID) | SUBCUTANEOUS | Status: DC
  Administered 2017-10-14 – 2017-10-16 (×4): 3 [IU] via SUBCUTANEOUS

## 2017-10-13 MED ORDER — METHYLPREDNISOLONE SODIUM SUCC 125 MG IJ SOLR
125.0000 mg | Freq: Once | INTRAMUSCULAR | Status: AC
  Administered 2017-10-13: 125 mg via INTRAVENOUS
  Filled 2017-10-13: qty 2

## 2017-10-13 NOTE — ED Notes (Signed)
No portable EKG machine available at this time. Both machines being used in triage at this time. Both this RN and Joelene Millin EMT attempted to locate EKG machine and was unable. Per triage, multiple pts with CP at this time, unable to bring to Pod C hallway at this time.

## 2017-10-13 NOTE — ED Notes (Signed)
Pt transferred into C23, Kim EMT obtaining EKG at this time

## 2017-10-13 NOTE — ED Notes (Signed)
Patient transported to X-ray 

## 2017-10-13 NOTE — H&P (Signed)
History and Physical    Antonio Ray FVC:[id number] DOB: October 24, 1933 DOA: 10/13/2017   PCP: [name], MD   Patient coming from:  Home    Chief Complaint: Shortness of breath  HPI: Antonio Ray is a 84 y.o. male with extensive medical history listed below, including recent hospitalization for influenza A with pneumonia, CAD, COPD, CKD stage III, and recent community-acquired pneumonia felt to be aspiration brought to the emergency department with increasing chest pain, worsening with inhalation and palpation.  The chest pain mostly radiated to the right side, accompanied by some shortness of breath, but no cough.  The patient was wheezing on presentation, requiring albuterol in route, and was placed on O2 nasal cannula, for improvement of his respiratory status.  Of note, the patient does not wear home oxygen.  He denies any fever or chills.  He denies any abdominal pain, nausea or vomiting.  Of note, the patient does have history of achalasia, and had several evaluations in the past by GI, including Botox injections per report.  Last procedure was in 2017.  He denies any diarrhea.  He denies any recent medication changes except Lasix, which was used from 40-20 mg as an outpatient about 1 month ago.  Patient denies any significant lower extremity swelling.  No confusion is reported.  He denies any vision changes, or headaches.  ED Course:  BP (!) 98/51   Pulse 68   Temp 99.1 F (37.3 C) (Oral)   Resp (!) 22   Ht 5' 8"  (1.727 m)   Wt 73 kg (161 lb)   SpO2 94%   BMI 24.48 kg/m    BNP 398.4 WBC 25.1, with negative chest x-ray for infiltrates or effusion.  Patient is on chronic steroids Creatinine 2.06, baseline between 1.4 and 1.5, GFR 33 Troponin EKG with atrial paced complexes, multiple PVCs, no significant changes since last tracing Last cardiac catheterization in 2007, showing nonobstructive disease Last Myoview 2011, showing anterior wall scar without ischemia, ICD in  place Last 2D echo in 2016 shows EF to 10-15% with global hypokinesis and akinesis of the anterior septum As hemoglobin A1c 6.9, at goal, followed by Dr. Cruzita Lederer.   Received Lasix 40 mg IV x1 He also received 125 mg of IV Solu-Medrol for improvement of his respiratory status.  Review of Systems: As per HPI otherwise 10 point review of systems negative.   Past Medical History:  Diagnosis Date  . Achalasia   . AICD (automatic cardioverter/defibrillator) present 03/30/2011   Analyze ST study patient  . Anginal pain ([place])   . BPH (benign prostatic hyperplasia)   . CHF (congestive heart failure) ([place])   . Chronic systolic dysfunction of left ventricle    EF 30-35%, CLASS II - III SYMPTOMS; intolerant to Coreg  . CKD (chronic kidney disease) stage 3, GFR 30-59 ml/min ([name]) 06/08/2012  . COPD (chronic obstructive pulmonary disease) ([place])   . Coronary artery disease    History of remote anterior MI with PCI to LAD in 2006; most recent cath 2007, no intervention required  . DOE (dyspnea on exertion)    with heavy exertion  . Dysrhythmia   . Esophageal dysmotility   . GERD (gastroesophageal reflux disease)   . GERD (gastroesophageal reflux disease)   . Head injury, closed, with concussion 2000ish  . Heart murmur    hx  . HOH (hard of hearing)   . Hyperlipidemia   . Memory loss    improved  . Myocardial infarction ([place])  1994   ANTERIOR  . Pneumonia "several times"   aspiration pna with at least 3 admits for this 2016.   Marland Kitchen PVC's (premature ventricular contractions)   . Renal failure   . Skin cancer "several"   "forearms; head"  . Type II diabetes mellitus ([place])     Past Surgical History:  Procedure Laterality Date  . BALLOON DILATION N/A 09/09/2015   Procedure: BALLOON DILATION;  Surgeon: Mauri Pole, MD;  Location: [facility];  Service: Endoscopy;  Laterality: N/A;  rigiflex achalasia balloon dilators  . BOTOX INJECTION N/A 04/08/2015   Procedure: BOTOX INJECTION;   Surgeon: Milus Banister, MD;  Location: [facility];  Service: Endoscopy;  Laterality: N/A;  . CARDIAC CATHETERIZATION  01/11/2006   DEMONSTRATES AKINESIA OF THE DISTAL ANTERIOR WALL, DISTAL INFERIOR WALL AND AKINESIA OF THE APEX. THE BASAL SEGMENTS CONTRACT WELL AND OVERALL EF 35%  . CATARACT EXTRACTION W/ INTRAOCULAR LENS  IMPLANT, BILATERAL  08/2014-09/2014  . CHOLECYSTECTOMY  2/11  . CORONARY ANGIOPLASTY  1994   TO THE LAD  . ESOPHAGEAL MANOMETRY N/A 03/16/2015   Procedure: ESOPHAGEAL MANOMETRY (EM);  Surgeon: Jerene Bears, MD;  Location: [facility];  Service: Gastroenterology;  Laterality: N/A;  . ESOPHAGOGASTRODUODENOSCOPY N/A 04/08/2015   Procedure: ESOPHAGOGASTRODUODENOSCOPY (EGD);  Surgeon: Milus Banister, MD;  Location: [facility];  Service: Endoscopy;  Laterality: N/A;  . ESOPHAGOGASTRODUODENOSCOPY (EGD) WITH PROPOFOL N/A 09/09/2015   Procedure: ESOPHAGOGASTRODUODENOSCOPY (EGD) WITH PROPOFOL;  Surgeon: Mauri Pole, MD;  Location: [facility];  Service: Endoscopy;  Laterality: N/A;  pt. is to have gastrografin xray post recovery-do not discharge until results are back  . EYE SURGERY    . FOOT FRACTURE SURGERY Right 1980's  . INSERT / REPLACE / REMOVE PACEMAKER    . SKIN CANCER EXCISION  "several"   "forearms, head"    Social History Social History   Socioeconomic History  . Marital status: Widowed    Spouse name: Not on file  . Number of children: 2  . Years of education: GED  . Highest education level: Not on file  Social Needs  . Financial resource strain: Patient refused  . Food insecurity - worry: Never true  . Food insecurity - inability: Never true  . Transportation needs - medical: No  . Transportation needs - non-medical: No  Occupational History  . Occupation: Clinical biochemist: [employer]    Comment: Retired  . Occupation: [employer]    Employer: [employer]    Comment: Retired  Tobacco Use  . Smoking status: Former Smoker     Packs/day: 1.00    Years: 35.00    Pack years: 35.00    Types: Cigarettes    Last attempt to quit: 08/01/1992    Years since quitting: 25.2  . Smokeless tobacco: Former Systems developer    Quit date: 1994  Substance and Sexual Activity  . Alcohol use: No    Alcohol/week: 0.0 oz  . Drug use: No  . Sexual activity: Not Currently  Other Topics Concern  . Not on file  Social History Narrative   Pt lives in [place] alone.  Widowed.   Right-handed.   Rare caffeine use - maybe one cup per month.     Allergies  Allergen Reactions  . Codeine Other (See Comments)    Gets very angry, disoriented  . Dilaudid [Hydromorphone Hcl] Other (See Comments)    VERY AGITATED, HOSTILE  . Flomax [Tamsulosin Hcl] Shortness Of Breath  .  Morphine And Related Other (See Comments)    VERY AGITATED, HOSTILE  . Sulfa Antibiotics Shortness Of Breath  . Beta Adrenergic Blockers Other (See Comments)    Disorientation  . Carvedilol Other (See Comments)    DISORIENTATION    Family History  Problem Relation Age of Onset  . Stroke Father        Mother history unknown - never met her  . Colon cancer Neg Hx       Prior to Admission medications   Medication Sig Start Date End Date Taking? Authorizing Provider  albuterol (PROVENTIL HFA;VENTOLIN HFA) 108 (90 Base) MCG/ACT inhaler Inhale 2 puffs into the lungs every 6 (six) hours as needed for wheezing or shortness of breath. 04/22/16   Eugenie Filler, MD  aspirin 81 MG EC tablet Take 81 mg by mouth daily.      [provider]  Cholecalciferol (VITAMIN D) 2000 units tablet Take 2,000 Units by mouth daily.     [provider]  donepezil (ARICEPT) 10 MG tablet Take 1 tablet (10 mg total) by mouth every morning. 05/01/17   Marcial Pacas, MD  fenofibrate (TRICOR) 145 MG tablet TAKE 1 TABLET BY MOUTH EVERY DAY Patient taking differently: Take 145 mg by mouth once a day 02/10/17   Martinique, Peter M, MD  finasteride (PROSCAR) 5 MG tablet Take 5 mg by mouth  daily. 05/19/17   [provider]  furosemide (LASIX) 20 MG tablet Take 2 tablets (40 mg total) by mouth daily. Patient taking differently: Take 20 mg by mouth daily.  11/22/16   Martinique, Peter M, MD  insulin aspart (NOVOLOG FLEXPEN) 100 UNIT/ML FlexPen Inject 5-6 Units into the skin See admin instructions. 10/02/17   Philemon Kingdom, MD  Insulin Glargine [facility] KWIKPEN) 100 UNIT/ML SOPN INJECT 10 UNITS UNDER THE SKIN EVERY MORNING 10/02/17   Philemon Kingdom, MD  loratadine (CLARITIN) 10 MG tablet Take 1 tablet (10 mg total) by mouth daily. 07/02/15   Barton Dubois, MD  memantine (NAMENDA) 10 MG tablet Take 1 tablet (10 mg total) by mouth 2 (two) times daily. 05/01/17   Marcial Pacas, MD  Multiple Vitamins-Minerals (PRESERVISION AREDS 2) CAPS Take 1 capsule by mouth 2 (two) times daily.    [provider]  nitroGLYCERIN (NITRODUR - DOSED IN MG/24 HR) 0.2 mg/hr patch Place 1 patch (0.2 mg total) onto the skin daily. Alternates patch placement with odd/even days. 07/18/17   Martinique, Peter M, MD  pantoprazole (PROTONIX) 20 MG tablet TAKE 1 TABLET BY MOUTH DAILY 08/24/17   Mauri Pole, MD  pravastatin (PRAVACHOL) 40 MG tablet Take 1 tablet (40 mg total) by mouth at bedtime. 05/26/17   Martinique, Peter M, MD  SPIRIVA HANDIHALER 18 MCG inhalation capsule INHALE CONTENTS OF 1 CAPSULE VIA HANDIHALER ONCE DAILY 06/08/17   Juanito Doom, MD  SYMBICORT 160-4.5 MCG/ACT inhaler INHALE 2 PUFFS BY MOUTH TWICE DAILY 07/31/17   Juanito Doom, MD    Physical Exam:  Vitals:   10/13/17 1356 10/13/17 1357 10/13/17 1500 10/13/17 1630  BP: (!) 95/50  (!) 103/53 (!) 98/51  Pulse: 88  84 68  Resp: 18  (!) 22 (!) 22  Temp: 99.1 F (37.3 C)     TempSrc: Oral     SpO2: 97%  98% 94%  Weight:  73 kg (161 lb)    Height:  5' 8"  (1.727 m)     Constitutional: NAD, calm, chronically ill-appearing. Eyes: PERRL, lids and conjunctivae normal ENMT: Mucous  membranes are moist, without exudate or  lesions  Neck: normal, supple, no masses, no thyromegaly Respiratory: Essentially clear to auscultation bilaterally, no wheezing, no crackles. Normal respiratory effort  Cardiovascular: Regular rate and rhythm, no murmurs, rubs or gallops.  Trace of lower extremity edema. 2+ pedal pulses. No carotid bruits.  Abdomen: Soft, non tender, No hepatosplenomegaly. Bowel sounds positive.  Musculoskeletal: no clubbing / cyanosis. Moves all extremities Skin: no jaundice, No lesions.  Neurologic: Sensation intact  Strength equal in all extremities Psychiatric:   Alert and oriented x 3. Normal mood.     Labs on Admission: I have personally reviewed following labs and imaging studies  CBC: Recent Labs  Lab 10/13/17 1358  WBC 25.1*  HGB 12.7*  HCT 41.0  MCV 87.8  PLT 599    Basic Metabolic Panel: Recent Labs  Lab 10/13/17 1401  NA 140  K 4.0  CL 104  CO2 25  GLUCOSE 89  BUN 41*  CREATININE 2.06*  CALCIUM 9.2    GFR: Estimated Creatinine Clearance: 26.7 mL/min (A) (by C-G formula based on SCr of 2.06 mg/dL (H)).  Liver Function Tests: Recent Labs  Lab 10/13/17 1401  AST 37  ALT 21  ALKPHOS 41  BILITOT 0.9  PROT 6.7  ALBUMIN 3.2*   No results for input(s): LIPASE, AMYLASE in the last 168 hours. No results for input(s): AMMONIA in the last 168 hours.  Coagulation Profile: No results for input(s): INR, PROTIME in the last 168 hours.  Cardiac Enzymes: No results for input(s): CKTOTAL, CKMB, CKMBINDEX, TROPONINI in the last 168 hours.  BNP (last 3 results) No results for input(s): PROBNP in the last 8760 hours.  HbA1C: No results for input(s): HGBA1C in the last 72 hours.  CBG: Recent Labs  Lab 10/13/17 1650  GLUCAP 86    Lipid Profile: No results for input(s): CHOL, HDL, LDLCALC, TRIG, CHOLHDL, LDLDIRECT in the last 72 hours.  Thyroid Function Tests: No results for input(s): TSH, T4TOTAL, FREET4, T3FREE, THYROIDAB in the last 72 hours.  Anemia Panel: No  results for input(s): VITAMINB12, FOLATE, FERRITIN, TIBC, IRON, RETICCTPCT in the last 72 hours.  Urine analysis:    Component Value Date/Time   COLORURINE YELLOW 08/19/2017 2141   APPEARANCEUR CLEAR 08/19/2017 2141   LABSPEC 1.018 08/19/2017 2141   PHURINE 6.0 08/19/2017 2141   GLUCOSEU 50 (A) 08/19/2017 2141   HGBUR NEGATIVE 08/19/2017 2141   BILIRUBINUR NEGATIVE 08/19/2017 2141   BILIRUBINUR neg 06/06/2012 1037   KETONESUR NEGATIVE 08/19/2017 2141   PROTEINUR NEGATIVE 08/19/2017 2141   UROBILINOGEN 0.2 05/12/2015 0920   NITRITE NEGATIVE 08/19/2017 2141   LEUKOCYTESUR NEGATIVE 08/19/2017 2141    Sepsis Labs: @LABRCNTIP (procalcitonin:4,lacticidven:4) )No results found for this or any previous visit (from the past 240 hour(s)).   Radiological Exams on Admission: Dg Chest 2 View  Result Date: 10/13/2017 CLINICAL DATA:  Weakness, altered mental status, shortness of breath and chest pain. EXAM: CHEST - 2 VIEW COMPARISON:  09/27/2017 FINDINGS: Heart size is normal. Pacemaker/AICD remains in place. Aortic atherosclerosis as seen previously. Pulmonary vascularity within normal limits. Lungs are hyperinflated in there are chronically increased markings. No evidence of active infiltrate, mass, effusion or collapse. IMPRESSION: Stable radiographic pattern. Chronically increased interstitial markings. No edema, consolidation or collapse. Electronically Signed   By: Nelson Chimes M.D.   On: 10/13/2017 14:49    EKG: Independently reviewed.  Assessment/Plan Active Problems:   Coronary artery disease   Sick sinus syndrome ([name])   Acute on chronic  combined systolic and diastolic congestive heart failure, NYHA class 3 ([name])   Hyperlipidemia   PVC's (premature ventricular contractions)   S/P ICD (internal cardiac defibrillator) procedure   BPH (benign prostatic hyperplasia)   GERD (gastroesophageal reflux disease)   COPD GOLD IV   Esophageal dysmotilities   Achalasia   Cardiomyopathy,  ischemic   Chronic kidney disease, stage III (moderate) ([name])   Type 2 diabetes mellitus with stage 3 chronic kidney disease, with long-term current use of insulin ([name])   Mild cognitive impairment   HOH (hard of hearing)   Pneumonia, aspiration ([name])         Chest pain, felt to be multifactorial, unlikely cardiac, likely due to aspiration pneumonia (see below), which is recurring, last episode in January 2019 requiring hospitalization.  Troponin negative, EKG with atrial paced complexes, multiple PVCs, no significant changes since last tracing Last cardiac catheterization in 2007, showing nonobstructive disease. Last Myoview 2011, showing anterior wall scar without ischemia, ICD in place. Last 2D echo in 2016 shows EF to 10-15% with global hypokinesis and akinesis of the anterior septum Cycle troponin Repeat EKG Continue cardiac meds in a.m., as the patient has been placed n.p.o.  (Of note, he has taken his morning meds)   Aspiration pneumonia, recurrent  Likely due to dysphagia / h/o COPD   CXR today unremarkable for effusion, masses or infiltrates    WBC 25  (? Steroids) Received Solumedrol 125 mg x1 IV  Oxygen   sputum cultures with AFP  IV antibiotics with per protocol with Zosyn  Procalcitonin  Continue Spiriva IV fluids at 75 cc NS for 10 h Repeat CBC in am Pulmonary to see in the morning in view of recurrent aspiration pneumonia admissions SSI in view of steroids   Chronic achalasia, s/p multiple procedures NPO for now SLP in am GI to consult, for possible Botox verius other procedures to improve symptoms and avoid aspiration hospitalizations   Acute on chronic Diastolic CHF: BNP 280 . Received Laxix 46 mg IVx1   Hold Lasix Hold oral meds today until  Obtain daily weights Monitor intake and output      Hypertension BP (!) 98/51   Pulse 68   Temp 99.1 F (37.3 C) (Oral)   Resp (!) 22   Ht 5' 8"  (1.727 m)   Wt 73 kg (161 lb)   SpO2 94%   BMI 24.48 kg/m  Hold BP  meds and home Lasix today    Hyperlipidemia Hold meds today until evaluation for dysphagia    GERD,  IV Protonix     Acute on Chronic CKD: likely due to dehydration baseline creatinine 1.4, current 2     Lab Results  Component Value Date   CREATININE 2.06 (H) 10/13/2017   CREATININE 1.44 (H) 08/22/2017   CREATININE 1.55 (H) 08/21/2017  Hold further home  diuretics today and NSAIDS IVF at 75 cc/h  Follow BMP daily    History of Dementia  Continue Namenda tomorrow after SLP    Benign prostatic hypertrophy, no acute issues Continue Proscar tomorrow after SLP    DVT prophylaxis:  Heparin  Code Status:   Full    Family Communication:  Discussed with patient Disposition Plan: Expect patient to be discharged to home after condition improves Consults called:    GI and Pulmonary in am  Admission status:  Inpatient SDU    Sharene Butters, PA-C [facility]   10/13/2017, 5:28 PM

## 2017-10-13 NOTE — ED Notes (Signed)
ED Provider at bedside. 

## 2017-10-13 NOTE — Telephone Encounter (Signed)
Spoke with pt's son, [name]. States that the pt was having some weakness, altered mental status and chest pain. [name] and his sister thought it would be best for him to be evaluated in the ED. He just wanted Dr. Lake Bells to be aware of this. Nothing further was needed.

## 2017-10-13 NOTE — ED Notes (Signed)
Attempted to call report x 1  

## 2017-10-13 NOTE — Progress Notes (Signed)
Received report from [place] in the ED.

## 2017-10-13 NOTE — Progress Notes (Signed)
Pharmacy Antibiotic Note  Antonio Ray is a 84 y.o. male admitted on 10/13/2017 with concern for aspiration PNA. Noted recent admission in Jan '19. Pharmacy has been consulted for Zosyn dosing.  Plan: - Zosyn 3.375g IV x 1 (over 30 min) followed by 3.375g IV every 8 hours (infused over 4 hours) - Will continue to follow renal function, culture results, LOT, and antibiotic de-escalation plans   Height: 5\' 8"  (172.7 cm) Weight: 161 lb (73 kg) IBW/kg (Calculated) : 68.4  Temp (24hrs), Avg:99.1 F (37.3 C), Min:99.1 F (37.3 C), Max:99.1 F (37.3 C)  Recent Labs  Lab 10/13/17 1358 10/13/17 1401  WBC 25.1*  --   CREATININE  --  2.06*    Estimated Creatinine Clearance: 26.7 mL/min (A) (by C-G formula based on SCr of 2.06 mg/dL (H)).    Allergies  Allergen Reactions  . Codeine Other (See Comments)    Gets very angry, disoriented  . Dilaudid [Hydromorphone Hcl] Other (See Comments)    VERY AGITATED, HOSTILE  . Flomax [Tamsulosin Hcl] Shortness Of Breath  . Morphine And Related Other (See Comments)    VERY AGITATED, HOSTILE  . Sulfa Antibiotics Shortness Of Breath  . Beta Adrenergic Blockers Other (See Comments)    Disorientation  . Carvedilol Other (See Comments)    DISORIENTATION    Antimicrobials this admission: Zosyn 3/15 >>  Dose adjustments this admission:   Microbiology results: 3/15 BCx >. 3/15 RCx >> 3/15 UCx >>   Thank you for allowing pharmacy to be a part of this patient's care.  Alycia Rossetti, PharmD, BCPS Clinical Pharmacist Pager: [phone] Clinical phone for 10/13/2017: [phone] 10/13/2017 5:09 PM

## 2017-10-13 NOTE — ED Triage Notes (Signed)
Pt via EMS with CP x 2 hours worsening with inhalation and palpation. Per EMS, pt had sudden onset L CP that then radiated to R side with SOB. Pt seen and d/c from hospital for influenza and pneumonia Jan 2018 and has since recovered per pt. Per EMS, pt with expiratory wheezes, 5 mg albuterol given en route. Pt on 4L o2 [place] at this time, does not wear home O2. Denies N/V, diaphoresis, cough, fever, or chills. A&Ox4 at this time. EMS VS: 110/46, 82 HR, 18 RR, 93% on RA and 98% on 4L O2, CBG 92.

## 2017-10-13 NOTE — ED Notes (Signed)
Paged admitting to RN

## 2017-10-13 NOTE — ED Provider Notes (Signed)
[facility] Provider Note   CSN: [id number] Arrival date & time: 10/13/17  1351     History   Chief Complaint Chief Complaint  Patient presents with  . Chest Pain  . Shortness of Breath    HPI Antonio Ray is a 84 y.o. male.  HPI  Patient presents with concern of lower chest pain. Pain is focally about the right inferior chest wall, occasionally in the left lower chest wall. Patient developed pain soon after awakening today, and since onset the pain is been persistent, is sore, moderate, mild. Pain is otherwise nonradiating, with no new dyspnea, no new cough, fever, chills, abdominal pain, nausea, vomiting, diarrhea. No medication changes to attempt pain control, nor any recent general medication changes.   Past Medical History:  Diagnosis Date  . Achalasia   . AICD (automatic cardioverter/defibrillator) present 03/30/2011   Analyze ST study patient  . Anginal pain ([place])   . BPH (benign prostatic hyperplasia)   . CHF (congestive heart failure) ([place])   . Chronic systolic dysfunction of left ventricle    EF 30-35%, CLASS II - III SYMPTOMS; intolerant to Coreg  . CKD (chronic kidney disease) stage 3, GFR 30-59 ml/min ([name]) 06/08/2012  . COPD (chronic obstructive pulmonary disease) ([place])   . Coronary artery disease    History of remote anterior MI with PCI to LAD in 2006; most recent cath 2007, no intervention required  . DOE (dyspnea on exertion)    with heavy exertion  . Dysrhythmia   . Esophageal dysmotility   . GERD (gastroesophageal reflux disease)   . GERD (gastroesophageal reflux disease)   . Head injury, closed, with concussion 2000ish  . Heart murmur    hx  . HOH (hard of hearing)   . Hyperlipidemia   . Memory loss    improved  . Myocardial infarction ([place]) 1994   ANTERIOR  . Pneumonia "several times"   aspiration pna with at least 3 admits for this 2016.   Marland Kitchen PVC's (premature ventricular contractions)   . Renal  failure   . Skin cancer "several"   "forearms; head"  . Type II diabetes mellitus [facility])     Patient Active Problem List   Diagnosis Date Noted  . CAP (community acquired pneumonia) 08/19/2017  . Influenza A with pneumonia 08/19/2017  . Type II diabetes mellitus ([place])   . Skin cancer   . Renal failure   . HOH (hard of hearing)   . Heart murmur   . Head injury, closed, with concussion   . Esophageal dysmotility   . Dysrhythmia   . DOE (dyspnea on exertion)   . Chronic systolic dysfunction of left ventricle   . CHF (congestive heart failure) ([place])   . Anginal pain ([place])   . Pneumonia 06/12/2017  . Acute UTI   . Altered mental state 02/15/2017  . Achalasia of esophagus   . Encephalopathy   . Fever chills   . SOB (shortness of breath)   . Acute respiratory failure with hypoxemia ([place])   . Acute respiratory failure ([place]) 04/21/2016  . Chronic combined systolic and diastolic congestive heart failure ([place]) 04/21/2016  . Memory loss 04/21/2016  . Allergic rhinitis 04/11/2016  . Chronic systolic CHF (congestive heart failure) ([place]) 10/30/2015  . Mild cognitive impairment 08/18/2015  . Difficulty in swallowing   . AKI (acute kidney injury) ([place])   . Type 2 diabetes mellitus with stage 3 chronic kidney disease, with long-term current use of insulin (  [place])   . Chronic systolic congestive heart failure ([place])   . Chronic kidney disease, stage III (moderate) ([name])   . Other specified hypotension   . CKD (chronic kidney disease), stage III ([place])   . Other emphysema ([place])   . Systolic CHF, acute on chronic ([name])   . AICD (automatic cardioverter/defibrillator) present   . CAD S/P percutaneous coronary angioplasty   . Acute respiratory failure with hypoxia ([place]) 04/03/2015  . Cardiomyopathy, ischemic 04/03/2015  . Elevated troponin 04/03/2015  . Sepsis ([place]) 03/31/2015  . COPD exacerbation ([place])   . Aspiration into airway 03/21/2015  . Achalasia 03/21/2015  . Dysphagia   .  Esophageal dysphagia   . Esophageal dysmotilities   . COPD with acute exacerbation ([place]) 01/14/2015  . Left ventricular apical thrombus 01/14/2015  . Acute on chronic systolic CHF (congestive heart failure) ([place]) 01/14/2015  . ARF (acute renal failure) ([place]) 01/14/2015  . Dysphagia, unspecified(787.20) 10/30/2013  . COPD GOLD IV 10/07/2013  . Leukocytosis 09/11/2013  . Acute encephalopathy 09/10/2013  . Renal failure (ARF), acute on chronic ([name]) 09/10/2013  . Automatic implantable cardioverter-defibrillator in situ 07/06/2012  . CKD (chronic kidney disease) stage 3, GFR 30-59 ml/min ([name]) 06/08/2012  . GERD (gastroesophageal reflux disease) 09/26/2011  . BPH (benign prostatic hyperplasia) 08/22/2011  . COPD (chronic obstructive pulmonary disease) ([place]) 07/17/2011  . S/P ICD (internal cardiac defibrillator) procedure 04/20/2011  . Coronary artery disease   . Sick sinus syndrome ([place])   . Acute on chronic combined systolic and diastolic congestive heart failure, NYHA class 3 ([place])   . Hyperlipidemia   . PVC's (premature ventricular contractions)   . Myocardial infarction [facility]) 08/01/1992    Past Surgical History:  Procedure Laterality Date  . BALLOON DILATION N/A 09/09/2015   Procedure: BALLOON DILATION;  Surgeon: Mauri Pole, MD;  Location: [facility];  Service: Endoscopy;  Laterality: N/A;  rigiflex achalasia balloon dilators  . BOTOX INJECTION N/A 04/08/2015   Procedure: BOTOX INJECTION;  Surgeon: Milus Banister, MD;  Location: [facility];  Service: Endoscopy;  Laterality: N/A;  . CARDIAC CATHETERIZATION  01/11/2006   DEMONSTRATES AKINESIA OF THE DISTAL ANTERIOR WALL, DISTAL INFERIOR WALL AND AKINESIA OF THE APEX. THE BASAL SEGMENTS CONTRACT WELL AND OVERALL EF 35%  . CATARACT EXTRACTION W/ INTRAOCULAR LENS  IMPLANT, BILATERAL  08/2014-09/2014  . CHOLECYSTECTOMY  2/11  . CORONARY ANGIOPLASTY  1994   TO THE LAD  . ESOPHAGEAL MANOMETRY N/A 03/16/2015   Procedure: ESOPHAGEAL  MANOMETRY (EM);  Surgeon: Jerene Bears, MD;  Location: [facility];  Service: Gastroenterology;  Laterality: N/A;  . ESOPHAGOGASTRODUODENOSCOPY N/A 04/08/2015   Procedure: ESOPHAGOGASTRODUODENOSCOPY (EGD);  Surgeon: Milus Banister, MD;  Location: [facility];  Service: Endoscopy;  Laterality: N/A;  . ESOPHAGOGASTRODUODENOSCOPY (EGD) WITH PROPOFOL N/A 09/09/2015   Procedure: ESOPHAGOGASTRODUODENOSCOPY (EGD) WITH PROPOFOL;  Surgeon: Mauri Pole, MD;  Location: [facility];  Service: Endoscopy;  Laterality: N/A;  pt. is to have gastrografin xray post recovery-do not discharge until results are back  . EYE SURGERY    . FOOT FRACTURE SURGERY Right 1980's  . INSERT / REPLACE / REMOVE PACEMAKER    . SKIN CANCER EXCISION  "several"   "forearms, head"       Home Medications    Prior to Admission medications   Medication Sig Start Date End Date Taking? Authorizing Provider  albuterol (PROVENTIL HFA;VENTOLIN HFA) 108 (90 Base) MCG/ACT inhaler Inhale 2 puffs into the lungs every 6 (six) hours as needed for  wheezing or shortness of breath. 04/22/16   Eugenie Filler, MD  aspirin 81 MG EC tablet Take 81 mg by mouth daily.      [provider]  Cholecalciferol (VITAMIN D) 2000 units tablet Take 2,000 Units by mouth daily.     [provider]  donepezil (ARICEPT) 10 MG tablet Take 1 tablet (10 mg total) by mouth every morning. 05/01/17   Marcial Pacas, MD  fenofibrate (TRICOR) 145 MG tablet TAKE 1 TABLET BY MOUTH EVERY DAY Patient taking differently: Take 145 mg by mouth once a day 02/10/17   [place], Peter M, MD  finasteride (PROSCAR) 5 MG tablet Take 5 mg by mouth daily. 05/19/17   [provider]  furosemide (LASIX) 20 MG tablet Take 2 tablets (40 mg total) by mouth daily. Patient taking differently: Take 20 mg by mouth daily.  11/22/16   [place], Peter M, MD  insulin aspart (NOVOLOG FLEXPEN) 100 UNIT/ML FlexPen Inject 5-6 Units into the skin See admin instructions. 10/02/17    Philemon Kingdom, MD  Insulin [facility] KWIKPEN) 100 UNIT/ML SOPN INJECT 10 UNITS UNDER THE SKIN EVERY MORNING 10/02/17   Philemon Kingdom, MD  loratadine (CLARITIN) 10 MG tablet Take 1 tablet (10 mg total) by mouth daily. 07/02/15   Barton Dubois, MD  memantine (NAMENDA) 10 MG tablet Take 1 tablet (10 mg total) by mouth 2 (two) times daily. 05/01/17   Marcial Pacas, MD  Multiple Vitamins-Minerals (PRESERVISION AREDS 2) CAPS Take 1 capsule by mouth 2 (two) times daily.    [provider]  nitroGLYCERIN (NITRODUR - DOSED IN MG/24 HR) 0.2 mg/hr patch Place 1 patch (0.2 mg total) onto the skin daily. Alternates patch placement with odd/even days. 07/18/17   [place], Peter M, MD  pantoprazole (PROTONIX) 20 MG tablet TAKE 1 TABLET BY MOUTH DAILY 08/24/17   Mauri Pole, MD  pravastatin (PRAVACHOL) 40 MG tablet Take 1 tablet (40 mg total) by mouth at bedtime. 05/26/17   [place], Peter M, MD  SPIRIVA HANDIHALER 18 MCG inhalation capsule INHALE CONTENTS OF 1 CAPSULE VIA HANDIHALER ONCE DAILY 06/08/17   Juanito Doom, MD  SYMBICORT 160-4.5 MCG/ACT inhaler INHALE 2 PUFFS BY MOUTH TWICE DAILY 07/31/17   Juanito Doom, MD    Family History Family History  Problem Relation Age of Onset  . Stroke Father        Mother history unknown - never met her  . Colon cancer Neg Hx     Social History Social History   Tobacco Use  . Smoking status: Former Smoker    Packs/day: 1.00    Years: 35.00    Pack years: 35.00    Types: Cigarettes    Last attempt to quit: 08/01/1992    Years since quitting: 25.2  . Smokeless tobacco: Former Systems developer    Quit date: 1994  Substance Use Topics  . Alcohol use: No    Alcohol/week: 0.0 oz  . Drug use: No     Allergies   Codeine; Dilaudid [hydromorphone hcl]; Flomax [tamsulosin hcl]; Morphine and related; Sulfa antibiotics; Beta adrenergic blockers; and Carvedilol   Review of Systems Review of Systems  Constitutional:       Per HPI,  otherwise negative  HENT:       Per HPI, otherwise negative  Respiratory:       Per HPI, otherwise negative  Cardiovascular:       Per HPI, otherwise negative  Gastrointestinal: Negative for vomiting.  Endocrine:  Negative aside from HPI  Genitourinary:       Neg aside from HPI   Musculoskeletal:       Per HPI, otherwise negative  Skin: Negative.   Neurological: Negative for syncope.     Physical Exam Updated Vital Signs BP (!) 95/50 (BP Location: Right Arm)   Pulse 88   Temp 99.1 F (37.3 C) (Oral)   Resp 18   Ht '5\' 8"'$  (1.727 m)   Wt 73 kg (161 lb)   SpO2 97%   BMI 24.48 kg/m   Physical Exam  Constitutional: He is oriented to person, place, and time. He appears well-developed. No distress.  HENT:  Head: Normocephalic and atraumatic.  Eyes: Conjunctivae and EOM are normal.  Cardiovascular: Normal rate and regular rhythm.  Pulmonary/Chest: Effort normal. No stridor. No respiratory distress.  Abdominal: He exhibits no distension.  Musculoskeletal: He exhibits no edema.  Neurological: He is alert and oriented to person, place, and time.  Skin: Skin is warm and dry.  Psychiatric: He has a normal mood and affect.  Nursing note and vitals reviewed.    ED Treatments / Results  Labs (all labs ordered are listed, but only abnormal results are displayed) Labs Reviewed  CBC - Abnormal; Notable for the following components:      Result Value   WBC 25.1 (*)    Hemoglobin 12.7 (*)    RDW 16.6 (*)    All other components within normal limits  COMPREHENSIVE METABOLIC PANEL - Abnormal; Notable for the following components:   BUN 41 (*)    Creatinine, Ser 2.06 (*)    Albumin 3.2 (*)    GFR calc non Af Amer 28 (*)    GFR calc Af Amer 33 (*)    All other components within normal limits  BRAIN NATRIURETIC PEPTIDE - Abnormal; Notable for the following components:   B Natriuretic Peptide 398.4 (*)    All other components within normal limits  I-STAT TROPONIN, ED     EKG  EKG Interpretation  Date/Time:  Friday October 13 2017 14:23:57 EDT Ventricular Rate:  92 PR Interval:    QRS Duration: 161 QT Interval:  387 QTC Calculation: 471 R Axis:   -76 Text Interpretation:  Atrial-paced complexes Multiple ventricular premature complexes Nonspecific IVCD with LAD Anterior infarct, old No significant change since last tracing Abnormal ekg Confirmed by Carmin Muskrat [id number]) on 10/13/2017 2:33:48 PM Also confirmed by Carmin Muskrat ([id number]), editor [place], Jeannetta Nap [phone])  on 10/13/2017 3:25:58 PM       Radiology Dg Chest 2 View  Result Date: 10/13/2017 CLINICAL DATA:  Weakness, altered mental status, shortness of breath and chest pain. EXAM: CHEST - 2 VIEW COMPARISON:  09/27/2017 FINDINGS: Heart size is normal. Pacemaker/AICD remains in place. Aortic atherosclerosis as seen previously. Pulmonary vascularity within normal limits. Lungs are hyperinflated in there are chronically increased markings. No evidence of active infiltrate, mass, effusion or collapse. IMPRESSION: Stable radiographic pattern. Chronically increased interstitial markings. No edema, consolidation or collapse. Electronically Signed   By: Nelson Chimes M.D.   On: 10/13/2017 14:49    Procedures Procedures (including critical care time)  Medications Ordered in ED Medications  furosemide (LASIX) injection 60 mg (not administered)     Initial Impression / Assessment and Plan / ED Course  I have reviewed the triage vital signs and the nursing notes.  Pertinent labs & imaging results that were available during my care of the patient were reviewed by me and considered in my  medical decision making (see chart for details).     After initial exam I evaluate the patient's history cardiac history below  "He has a history of coronary disease with remote myocardial infarction in 1994. He had emergent angioplasty of the LAD. Cardiac catheterization 2007 showed nonobstructive disease. He has an  ischemic cardiomyopathy with ejection fraction of 30-35%. His last Myoview in 2011 showed an anterior wall scar without ischemia. He has an ICD in place. Echo in September 2016 showed EF had dropped to 10-15% with global hypokinesis and akinesis of the anteroseptum. "   3:44 PM Now coming by his son, daughter-in-law. They note that the patient is notably different, and typically does not of pain when he has episodes of pneumonia. We discussed all findings thus far, including concern for worsening heart failure, comp gated by worsening renal function as well. They note the patient had decrease in his Lasix dose about 1 month ago from 40 mg to 20 mill grams. This note the patient was hypoxic, beyond normal on room air, 9090/92%, and this is unusual for him, as he does not wear oxygen.  Concern for worsening congestive heart failure, in the face of renal dysfunction, the patient will receive IV Lasix, be admitted for further evaluation and management. No obvious pneumonia, but with leukocytosis, and his history of recurrent pneumonia, monitoring will be beneficial as well.   Final Clinical Impressions(s) / ED Diagnoses  Atypical chest pain Heart failure exacerbation Renal dysfunction, acute on chronic   Carmin Muskrat, MD 10/13/17 1547

## 2017-10-14 ENCOUNTER — Inpatient Hospital Stay (HOSPITAL_COMMUNITY): Payer: Medicare Other

## 2017-10-14 DIAGNOSIS — Z794 Long term (current) use of insulin: Secondary | ICD-10-CM

## 2017-10-14 DIAGNOSIS — J690 Pneumonitis due to inhalation of food and vomit: Principal | ICD-10-CM

## 2017-10-14 DIAGNOSIS — K220 Achalasia of cardia: Secondary | ICD-10-CM

## 2017-10-14 DIAGNOSIS — R0789 Other chest pain: Secondary | ICD-10-CM

## 2017-10-14 DIAGNOSIS — E1122 Type 2 diabetes mellitus with diabetic chronic kidney disease: Secondary | ICD-10-CM

## 2017-10-14 DIAGNOSIS — N183 Chronic kidney disease, stage 3 (moderate): Secondary | ICD-10-CM

## 2017-10-14 LAB — CBC
HCT: 38.9 % — ABNORMAL LOW (ref 39.0–52.0)
Hemoglobin: 11.9 g/dL — ABNORMAL LOW (ref 13.0–17.0)
MCH: 27.2 pg (ref 26.0–34.0)
MCHC: 30.6 g/dL (ref 30.0–36.0)
MCV: 88.8 fL (ref 78.0–100.0)
PLATELETS: 237 10*3/uL (ref 150–400)
RBC: 4.38 MIL/uL (ref 4.22–5.81)
RDW: 16.7 % — ABNORMAL HIGH (ref 11.5–15.5)
WBC: 14.7 10*3/uL — ABNORMAL HIGH (ref 4.0–10.5)

## 2017-10-14 LAB — BASIC METABOLIC PANEL
Anion gap: 13 (ref 5–15)
BUN: 45 mg/dL — AB (ref 6–20)
CO2: 23 mmol/L (ref 22–32)
CREATININE: 1.94 mg/dL — AB (ref 0.61–1.24)
Calcium: 8.6 mg/dL — ABNORMAL LOW (ref 8.9–10.3)
Chloride: 104 mmol/L (ref 101–111)
GFR calc Af Amer: 35 mL/min — ABNORMAL LOW (ref >60)
GFR calc non Af Amer: 30 mL/min — ABNORMAL LOW (ref >60)
Glucose, Bld: 186 mg/dL — ABNORMAL HIGH (ref 65–99)
Potassium: 4.2 mmol/L (ref 3.5–5.1)
Sodium: 140 mmol/L (ref 135–145)

## 2017-10-14 LAB — URINALYSIS, COMPLETE (UACMP) WITH MICROSCOPIC
BILIRUBIN URINE: NEGATIVE
Bacteria, UA: NONE SEEN
GLUCOSE, UA: NEGATIVE mg/dL
HGB URINE DIPSTICK: NEGATIVE
Ketones, ur: NEGATIVE mg/dL
LEUKOCYTES UA: NEGATIVE
NITRITE: NEGATIVE
PH: 5 (ref 5.0–8.0)
Protein, ur: NEGATIVE mg/dL
Specific Gravity, Urine: 1.019 (ref 1.005–1.030)

## 2017-10-14 LAB — GLUCOSE, CAPILLARY
Glucose-Capillary: 166 mg/dL — ABNORMAL HIGH (ref 65–99)
Glucose-Capillary: 152 mg/dL — ABNORMAL HIGH (ref 65–99)
Glucose-Capillary: 170 mg/dL — ABNORMAL HIGH (ref 65–99)
Glucose-Capillary: 178 mg/dL — ABNORMAL HIGH (ref 65–99)
Glucose-Capillary: 138 mg/dL — ABNORMAL HIGH (ref 65–99)

## 2017-10-14 LAB — TROPONIN I
TROPONIN I: 0.04 ng/mL — AB (ref <0.03)
TROPONIN I: 0.03 ng/mL — AB (ref <0.03)

## 2017-10-14 LAB — PROTIME-INR
INR: 1.24
Prothrombin Time: 15.5 seconds — ABNORMAL HIGH (ref 11.4–15.2)

## 2017-10-14 LAB — LACTIC ACID, PLASMA: LACTIC ACID, VENOUS: 0.9 mmol/L (ref 0.5–1.9)

## 2017-10-14 MED ORDER — DONEPEZIL HCL 10 MG PO TABS
10.0000 mg | ORAL_TABLET | Freq: Every morning | ORAL | Status: DC
  Administered 2017-10-15 – 2017-10-16 (×2): 10 mg via ORAL
  Filled 2017-10-14: qty 2
  Filled 2017-10-14: qty 1

## 2017-10-14 MED ORDER — ORAL CARE MOUTH RINSE
15.0000 mL | Freq: Two times a day (BID) | OROMUCOSAL | Status: DC
  Administered 2017-10-14 – 2017-10-16 (×4): 15 mL via OROMUCOSAL

## 2017-10-14 MED ORDER — MEMANTINE HCL 10 MG PO TABS
10.0000 mg | ORAL_TABLET | Freq: Two times a day (BID) | ORAL | Status: DC
  Administered 2017-10-14 – 2017-10-16 (×4): 10 mg via ORAL
  Filled 2017-10-14 (×5): qty 1

## 2017-10-14 NOTE — Progress Notes (Signed)
Patient arrived to [phone]. Patient is A&Ox4 Vital signs stable on arrival. Telemetry box [id number] applied and second verified with [facility] W.,RN. Skin assessment completed by this RN and Durenda Guthrie., RN. Patient has no complaints at this time and is in no distress at this time. Family at bedside. Will continue to monitor and treat per MD orders.

## 2017-10-14 NOTE — Consult Note (Signed)
PULMONARY / CRITICAL CARE MEDICINE   Name: Antonio Ray MRN: [id number] DOB: 1933/08/20    ADMISSION DATE:  10/13/2017 CONSULTATION DATE:  10/14/2017  REFERRING MD:  Tyrell Antonio  CHIEF COMPLAINT:  Pneumonia  HISTORY OF PRESENT ILLNESS:   84 y/o male well known to me for recurrent episodes of pneumonia and COPD was admitted for confusion, trouble breathing and pain in the right chest.  He was in his usual state of health until yesterday morning when he developed chills in the mornings associated with the above symptoms. The confusion worsened as the day went on and he developed worsening dyspnea.  He was brought to the ER and admitted to medicine again.  PCCM consulted. He feels better today but still has some pain in the right side of his chest.   PAST MEDICAL HISTORY :  He  has a past medical history of Achalasia, AICD (automatic cardioverter/defibrillator) present (03/30/2011), Anginal pain ([place]), BPH (benign prostatic hyperplasia), CHF (congestive heart failure) ([place]), Chronic systolic dysfunction of left ventricle, CKD (chronic kidney disease) stage 3, GFR 30-59 ml/min ([place]) (06/08/2012), COPD (chronic obstructive pulmonary disease) ([place]), Coronary artery disease, DOE (dyspnea on exertion), Dysrhythmia, Esophageal dysmotility, GERD (gastroesophageal reflux disease), GERD (gastroesophageal reflux disease), Head injury, closed, with concussion (2000ish), Heart murmur, HOH (hard of hearing), Hyperlipidemia, Memory loss, Myocardial infarction ([place]) (1994), Pneumonia ("several times"), PVC's (premature ventricular contractions), Renal failure, Skin cancer ("several"), and Type II diabetes mellitus ([place]).  PAST SURGICAL HISTORY: He  has a past surgical history that includes Foot fracture surgery (Right, 1980's); Cholecystectomy (2/11); Cataract extraction w/ intraocular lens  implant, bilateral (08/2014-09/2014); Cardiac catheterization (01/11/2006); Coronary angioplasty (1994); Skin cancer excision  ("several"); Esophageal manometry (N/A, 03/16/2015); Insert / replace / remove pacemaker; Esophagogastroduodenoscopy (N/A, 04/08/2015); Botox injection (N/A, 04/08/2015); Esophagogastroduodenoscopy (egd) with propofol (N/A, 09/09/2015); Balloon dilation (N/A, 09/09/2015); and Eye surgery.  Allergies  Allergen Reactions  . Codeine Other (See Comments)    Gets very angry, disoriented  . Dilaudid [Hydromorphone Hcl] Other (See Comments)    VERY AGITATED, HOSTILE  . Flomax [Tamsulosin Hcl] Shortness Of Breath  . Morphine And Related Other (See Comments)    VERY AGITATED, HOSTILE  . Sulfa Antibiotics Shortness Of Breath  . Beta Adrenergic Blockers Other (See Comments)    Disorientation  . Carvedilol Other (See Comments)    DISORIENTATION    No current facility-administered medications on file prior to encounter.    Current Outpatient Medications on File Prior to Encounter  Medication Sig  . albuterol (PROVENTIL HFA;VENTOLIN HFA) 108 (90 Base) MCG/ACT inhaler Inhale 2 puffs into the lungs every 6 (six) hours as needed for wheezing or shortness of breath.  Marland Kitchen aspirin 81 MG EC tablet Take 81 mg by mouth daily.    . Cholecalciferol (VITAMIN D) 2000 units tablet Take 2,000 Units by mouth daily.   Marland Kitchen donepezil (ARICEPT) 10 MG tablet Take 1 tablet (10 mg total) by mouth every morning.  . fenofibrate (TRICOR) 145 MG tablet TAKE 1 TABLET BY MOUTH EVERY DAY (Patient taking differently: Take 145 mg by mouth once a day)  . finasteride (PROSCAR) 5 MG tablet Take 5 mg by mouth daily.  . furosemide (LASIX) 20 MG tablet Take 2 tablets (40 mg total) by mouth daily. (Patient taking differently: Take 20 mg by mouth daily. )  . insulin aspart (NOVOLOG FLEXPEN) 100 UNIT/ML FlexPen Inject 5-6 Units into the skin See admin instructions. (Patient taking differently: Inject 4 Units into the skin 3 (three) times daily before meals. )  .  Insulin Glargine (BASAGLAR KWIKPEN) 100 UNIT/ML SOPN INJECT 10 UNITS UNDER THE SKIN EVERY  MORNING  . loratadine (CLARITIN) 10 MG tablet Take 1 tablet (10 mg total) by mouth daily.  . memantine (NAMENDA) 10 MG tablet Take 1 tablet (10 mg total) by mouth 2 (two) times daily.  . Multiple Vitamins-Minerals (PRESERVISION AREDS 2) CAPS Take 1 capsule by mouth 2 (two) times daily.  . nitroGLYCERIN (NITRODUR - DOSED IN MG/24 HR) 0.2 mg/hr patch Place 1 patch (0.2 mg total) onto the skin daily. Alternates patch placement with odd/even days.  . pantoprazole (PROTONIX) 20 MG tablet TAKE 1 TABLET BY MOUTH DAILY (Patient taking differently: Take 20 mg by mouth once a day)  . pravastatin (PRAVACHOL) 40 MG tablet Take 1 tablet (40 mg total) by mouth at bedtime.  Marland Kitchen SPIRIVA HANDIHALER 18 MCG inhalation capsule INHALE CONTENTS OF 1 CAPSULE VIA HANDIHALER ONCE DAILY  . SYMBICORT 160-4.5 MCG/ACT inhaler INHALE 2 PUFFS BY MOUTH TWICE DAILY (Patient taking differently: Inhale 2 puffs into the lungs two times a day)    FAMILY HISTORY:  His indicated that his mother is deceased. He indicated that his father is deceased. He indicated that only one of his two sisters is alive. He indicated that the status of his neg hx is unknown.   SOCIAL HISTORY: He  reports that he quit smoking about 25 years ago. His smoking use included cigarettes. He has a 35.00 pack-year smoking history. He quit smokeless tobacco use about 25 years ago. He reports that he does not drink alcohol or use drugs.  REVIEW OF SYSTEMS:   Gen: Denies fever, chills, weight change, fatigue, night sweats HEENT: Denies blurred vision, double vision, hearing loss, tinnitus, sinus congestion, rhinorrhea, sore throat, neck stiffness, dysphagia PULM: per HPI CV: Notes right sided chest pain, edema, orthopnea, paroxysmal nocturnal dyspnea, palpitations GI: Denies abdominal pain, nausea, vomiting, diarrhea, hematochezia, melena, constipation, change in bowel habits GU: Denies dysuria, hematuria, polyuria, oliguria, urethral discharge Endocrine:  Denies hot or cold intolerance, polyuria, polyphagia or appetite change Derm: Denies rash, dry skin, scaling or peeling skin change Heme: Denies easy bruising, bleeding, bleeding gums Neuro: Denies headache, numbness, weakness, slurred speech, loss of memory or consciousness   SUBJECTIVE:  No acute issues  VITAL SIGNS: BP (!) 110/49 (BP Location: Left Arm)   Pulse 67   Temp 97.7 F (36.5 C) (Oral)   Resp 18   Ht 5\' 8"  (1.727 m)   Wt 155 lb 10.3 oz (70.6 kg)   SpO2 97%   BMI 23.67 kg/m   HEMODYNAMICS:    VENTILATOR SETTINGS:    INTAKE / OUTPUT: I/O last 3 completed shifts: In: 1063.8 [I.V.:963.8; IV Piggyback:100] Out: 400 [Urine:400]  PHYSICAL EXAMINATION:  General:  Resting comfortably in chair HENT: NCAT OP clear PULM: Crackles right base B, normal effort CV: RRR, no mgr GI: BS+, soft, nontender MSK: normal bulk and tone Neuro: awake, alert, no distress, MAEW   LABS:  BMET Recent Labs  Lab 10/13/17 1401 10/14/17 0533  NA 140 140  K 4.0 4.2  CL 104 104  CO2 25 23  BUN 41* 45*  CREATININE 2.06* 1.94*  GLUCOSE 89 186*    Electrolytes Recent Labs  Lab 10/13/17 1401 10/14/17 0533  CALCIUM 9.2 8.6*    CBC Recent Labs  Lab 10/13/17 1358 10/14/17 0533  WBC 25.1* 14.7*  HGB 12.7* 11.9*  HCT 41.0 38.9*  PLT 250 237    Coag's Recent Labs  Lab 10/14/17 0533  INR 1.24    Sepsis Markers Recent Labs  Lab 10/13/17 1807 10/13/17 2302  LATICACIDVEN 0.9 0.9  PROCALCITON 4.46  --     ABG No results for input(s): PHART, PCO2ART, PO2ART in the last 168 hours.  Liver Enzymes Recent Labs  Lab 10/13/17 1401  AST 37  ALT 21  ALKPHOS 41  BILITOT 0.9  ALBUMIN 3.2*    Cardiac Enzymes Recent Labs  Lab 10/13/17 1807 10/13/17 2302 10/14/17 0533  TROPONINI 0.05* 0.04* 0.03*    Glucose Recent Labs  Lab 10/13/17 1650 10/14/17 0208 10/14/17 0808 10/14/17 1211  GLUCAP 86 166* 152* 170*    Imaging No results  found.   STUDIES:  10/05/2017 CT chest> tree in bud abnormalities and airway thickening R lung, significant achalasia and food throughout the esophagus  CULTURES: 3/15 blood >  3/15 urine >   ANTIBIOTICS: 3/15 zosyn >   SIGNIFICANT EVENTS:   LINES/TUBES:   DISCUSSION: 84 y/o male with COPD, achalasia here with chest pain and dypnea on the right with more crackles in the right lung than normal.  This picture is consistent with yet another case of aspiratation tracheobronchitis vs pneumonia.  His achalasia is the cause for recurrent aspiration, not oropharyngeal dysphagia.  I'm not sure anything else can be done for the dysphagia: dietary changes? Surgery?  ASSESSMENT / PLAN:  PULMONARY A: Acute tracheobronchitis vs aspiration pneumonia COPD, not in exacerbation Acute on chronic respiratory failure with hypoxemia P:   Aspiration precautions: due to achalasia Repeat CXR in AM Sit up, ambulate as much as possible Continue dulera Continue spiriva Continue as needed albuterol, will prescribe    GASTROINTESTINAL A:   Achalasia P:   Question for GI: dietary changes? Surgery?   Roselie Awkward, MD [place] [facility] Pager: [phone] Cell: [phone] After 3pm or if no response, call [phone]   10/14/2017, 3:33 PM

## 2017-10-14 NOTE — Evaluation (Signed)
Occupational Therapy Evaluation [name] MRN: [id number] DOB: May 13, 1934 Today's Date: 10/14/2017    History of Present Illness Pt is an 84 y.o. male with extensive medical history including achalasia, chronic systolic dysfunction of L ventricle, CKD, COPD, coronary artery disease, dyspnea on exertion, dysrhythmia, esophageal dysmotility, GERD, heart murmur, hard of hearing, hyperlipidemia, memory loss, myocardial infarction, pneumonia, PVC's, renal failure, skin cancer, type II diabetes mellitus. Admitted with with increasing chest pain, worsening with inhalation and palpation..   Clinical Impression   PTA, pt and daughter report pt was independent with basic ADL and functional mobility and living alone. His daughter assists with medication management tasks and checks in on him daily. Pt currently requires min guard assist for toilet transfers and supervision for LB ADL. He presents with decreased short-term memory and awareness at times and requires cues to slow down during ADL participation. Pt would benefit from continued OT services while admitted to improve independence and safety with ADL and functional mobility. He will likely progress well and be able to return home with intermittent supervision and no OT follow up.    Follow Up Recommendations  No OT follow up;Supervision - Intermittent    Equipment Recommendations  None recommended by OT    Recommendations for Other Services       Precautions / Restrictions Precautions Precautions: Other (comment) Precaution Comments: watch O2 stats Restrictions Weight Bearing Restrictions: No      Mobility Bed Mobility               General bed mobility comments: OOB in recliner on my arrival.   Transfers Overall transfer level: Needs assistance Equipment used: None Transfers: Sit to/from Stand Sit to Stand: Min guard         General transfer comment: daughter reports that pt stands impulsively  and moves quickly    Balance Overall balance assessment: Mild deficits observed, not formally tested                                         ADL either performed or assessed with clinical judgement   ADL Overall ADL's : Needs assistance/impaired Eating/Feeding: Sitting;Set up   Grooming: Supervision/safety;Standing   Upper Body Bathing: Set up;Sitting   Lower Body Bathing: Supervison/ safety;Sit to/from stand   Upper Body Dressing : Set up;Sitting   Lower Body Dressing: Supervision/safety;Sit to/from stand   Toilet Transfer: Min guard;Ambulation Toilet Transfer Details (indicate cue type and reason): holding on to furniture while ambulating Toileting- Clothing Manipulation and Hygiene: Min guard;Sit to/from stand       Functional mobility during ADLs: Min guard General ADL Comments: Pt able to complete basic tasks with slight instability. Memory deficits evident after a short time of functional task participation.     Vision Patient Visual Report: No change from baseline Vision Assessment?: No apparent visual deficits     Perception     Praxis      Pertinent Vitals/Pain Pain Assessment: No/denies pain     Hand Dominance Right   Extremity/Trunk Assessment Upper Extremity Assessment Upper Extremity Assessment: Overall WFL for tasks assessed   Lower Extremity Assessment Lower Extremity Assessment: Generalized weakness       Communication Communication Communication: HOH   Cognition Arousal/Alertness: Awake/alert Behavior During Therapy: WFL for tasks assessed/performed Overall Cognitive Status: Impaired/Different from baseline Area of Impairment: Memory;Safety/judgement  Memory: Decreased short-term memory   Safety/Judgement: Decreased awareness of safety;Decreased awareness of deficits     General Comments: Attempting to stand while catheter bag attached to chair. Had discussed IV earlier in session and on  return to chair, pt stated "what is this in my arm?"   General Comments  SpO2 97% on 2 L O2 with seated ADL. Ambulated to sink without supplemental O2 and SpO2 93-95% on return to chair.     Exercises     Shoulder Instructions      Home Living Family/patient expects to be discharged to:: Private residence Living Arrangements: Alone Available Help at Discharge: Family;Available PRN/intermittently Type of Home: House Home Access: Stairs to enter [employer] of Steps: 3-7 Entrance Stairs-Rails: Left;Right;Can reach both Home Layout: Two level;Able to live on main level with bedroom/bathroom;Full bath on main level Alternate Level Stairs-Number of Steps: flight Alternate Level Stairs-Rails: Right;Left Bathroom Shower/Tub: Occupational psychologist: Handicapped height Bathroom Accessibility: Yes   Home Equipment: Cane - single point;Walker - 2 wheels;Walker - 4 wheels;Shower seat;Grab bars - tub/shower;Hand held shower head   Additional Comments: he walks daily for exercise      Prior Functioning/Environment Level of Independence: Independent        Comments: Uses a walking stick when he walks in the woods.  Still drives, microwaves meals.  Family assists with filling medication boxes         OT Problem List: Decreased strength;Decreased range of motion;Decreased activity tolerance;Impaired balance (sitting and/or standing);Decreased safety awareness;Decreased knowledge of use of DME or AE;Decreased cognition;Decreased knowledge of precautions;Cardiopulmonary status limiting activity;Pain      OT Treatment/Interventions: Self-care/ADL training;Therapeutic exercise;Energy conservation;DME and/or AE instruction;Therapeutic activities;Patient/family education;Balance training    OT Goals(Current goals can be found in the care plan section) Acute Rehab OT Goals Patient Stated Goal: home asap OT Goal Formulation: With patient Time For Goal Achievement:  10/28/17 Potential to Achieve Goals: Good ADL Goals Pt Will Perform Grooming: with modified independence;standing Pt Will Perform Lower Body Dressing: with modified independence;sit to/from stand Pt Will Transfer to Toilet: with modified independence;ambulating Pt Will Perform Toileting - Clothing Manipulation and hygiene: with modified independence;sit to/from stand Additional ADL Goal #1: Pt will verbalize 3 strategies to conserve energy during morning ADL routine independently.  OT Frequency: Min 2X/week   Barriers to D/C:            Co-evaluation              AM-PAC PT "6 Clicks" Daily Activity     Outcome Measure Help from another person eating meals?: A Little Help from another person taking care of personal grooming?: A Little Help from another person toileting, which includes using toliet, bedpan, or urinal?: A Little Help from another person bathing (including washing, rinsing, drying)?: A Little Help from another person to put on and taking off regular upper body clothing?: A Little Help from another person to put on and taking off regular lower body clothing?: A Little 6 Click Score: 18   End of Session Equipment Utilized During Treatment: Gait belt;Rolling walker Nurse Communication: Mobility status  Activity Tolerance: Patient tolerated treatment well Patient left: in chair;with call bell/phone within reach;with chair alarm set  OT Visit Diagnosis: Other abnormalities of gait and mobility (R26.89);Muscle weakness (generalized) (M62.81);Other symptoms and signs involving cognitive function                Time: 1358-1416 OT Time Calculation (min): 18 min Charges:  OT General  Charges $OT Visit: 1 Visit OT Evaluation $OT Eval Low Complexity: 1 Low G-Codes:     Norman Herrlich, MS OTR/L  Pager: [place] 10/14/2017, 5:30 PM

## 2017-10-14 NOTE — Progress Notes (Signed)
PROGRESS NOTE    Antonio Ray  ZOX:[id number] DOB: [birth year] DOA: 10/13/2017 PCP: [name], MD    Brief Narrative: Antonio Ray is a 84 y.o. male with extensive medical history listed below, including recent hospitalization for influenza A with pneumonia, CAD, COPD, CKD stage III, and recent community-acquired pneumonia felt to be aspiration brought to the emergency department with increasing chest pain, worsening with inhalation and palpation.  The chest pain mostly radiated to the right side, accompanied by some shortness of breath, but no cough.  The patient was wheezing on presentation, requiring albuterol in route, and was placed on O2 nasal cannula, for improvement of his respiratory status.  Of note, the patient does not wear home oxygen.  He denies any fever or chills.  He denies any abdominal pain, nausea or vomiting.  Of note, the patient does have history of achalasia, and had several evaluations in the past by GI, including Botox injections per report.  Last procedure was in 2017.  He denies any diarrhea.  He denies any recent medication changes except Lasix, which was used from 40-20 mg as an outpatient about 1 month ago.  Patient denies any significant lower extremity swelling.  No confusion is reported.  He denies any vision changes, or headaches.   Assessment & Plan:   Principal Problem:   Pneumonia, aspiration ([place]) Active Problems:   Coronary artery disease   Sick sinus syndrome ([name])   Acute on chronic combined systolic and diastolic congestive heart failure, NYHA class 3 ([name])   Hyperlipidemia   PVC's (premature ventricular contractions)   S/P ICD (internal cardiac defibrillator) procedure   BPH (benign prostatic hyperplasia)   GERD (gastroesophageal reflux disease)   COPD GOLD IV   Esophageal dysmotilities   Achalasia   Cardiomyopathy, ischemic   Chronic kidney disease, stage III (moderate) ([name])   Type 2 diabetes mellitus with stage 3 chronic kidney  disease, with long-term current use of insulin ([name])   Mild cognitive impairment   HOH (hard of hearing)  1-Acute hypoxic respiratory failure; related to Aspiration PNA, tracheobronchitis.  Presents with chills, dyspnea, pleuritic chest pain.  Continue with Zosyn.  WBC trending down. Check.  CCM consulted.   2-Achalasia;  GI consulted.   Chest pain, pleuritic.  Might be related to PNA>  Check Doppler LE.  Mild elevated troponin. Check ECHO.   Acute on chronic Diastolic HF;  Hold lasix due to soft BP.   HTN; hold lasix,   CKD; stage II. Cr baseline 1.6--1.5 Hold lasix.  Cr peak to 2-- trending down to 1.9.   DM;  Hold glargine. SSI.   History of Dementia  Resume  Namenda   Benign prostatic hypertrophy, no acute issues On Proscar, hold for now. Due to soft BP     DVT prophylaxis: heparin.  Code Status: full code.  Family Communication: daughter at bedside.  Disposition Plan: to be determine.    Consultants:   Pulmonologist   GI  Procedures:  ECHO   Antimicrobials:  Zosyn 3-15  Subjective: He is feeling better today. He report improvement of pleuritic chest pain on the right.   Objective: Vitals:   10/13/17 2059 10/13/17 2100 10/14/17 0500 10/14/17 0532  BP:  105/62  (!) 110/49  Pulse:  61  67  Resp:  18  18  Temp:  97.9 F (36.6 C)  97.7 F (36.5 C)  TempSrc:  Oral  Oral  SpO2:  97%  97%  Weight: 70.5 kg (155 lb  6.8 oz)  70.6 kg (155 lb 10.3 oz)   Height: 5\' 8"  (1.727 m)       Intake/Output Summary (Last 24 hours) at 10/14/2017 0759 Last data filed at 10/13/2017 2341 Gross per 24 hour  Intake 50 ml  Output 400 ml  Net -350 ml   Filed Weights   10/13/17 1357 10/13/17 2059 10/14/17 0500  Weight: 73 kg (161 lb) 70.5 kg (155 lb 6.8 oz) 70.6 kg (155 lb 10.3 oz)    Examination:  General exam: Appears calm and comfortable  Respiratory system: bilateral ronchus.  Respiratory effort normal. Cardiovascular system: S1 & S2 heard, RRR.  No JVD, murmurs, rubs, gallops or clicks. No pedal edema. Gastrointestinal system: Abdomen is nondistended, soft and nontender. No organomegaly or masses felt. Normal bowel sounds heard. Central nervous system: Alert and oriented. No focal neurological deficits. Extremities: Symmetric 5 x 5 power. Skin: No rashes, lesions or ulcers    Data Reviewed: I have personally reviewed following labs and imaging studies  CBC: Recent Labs  Lab 10/13/17 1358 10/14/17 0533  WBC 25.1* 14.7*  HGB 12.7* 11.9*  HCT 41.0 38.9*  MCV 87.8 88.8  PLT 250 338   Basic Metabolic Panel: Recent Labs  Lab 10/13/17 1401 10/14/17 0533  NA 140 140  K 4.0 4.2  CL 104 104  CO2 25 23  GLUCOSE 89 186*  BUN 41* 45*  CREATININE 2.06* 1.94*  CALCIUM 9.2 8.6*   GFR: Estimated Creatinine Clearance: 28.4 mL/min (A) (by C-G formula based on SCr of 1.94 mg/dL (H)). Liver Function Tests: Recent Labs  Lab 10/13/17 1401  AST 37  ALT 21  ALKPHOS 41  BILITOT 0.9  PROT 6.7  ALBUMIN 3.2*   No results for input(s): LIPASE, AMYLASE in the last 168 hours. No results for input(s): AMMONIA in the last 168 hours. Coagulation Profile: Recent Labs  Lab 10/14/17 0533  INR 1.24   Cardiac Enzymes: Recent Labs  Lab 10/13/17 1807 10/13/17 2302 10/14/17 0533  TROPONINI 0.05* 0.04* 0.03*   BNP (last 3 results) No results for input(s): PROBNP in the last 8760 hours. HbA1C: Recent Labs    10/13/17 1807  HGBA1C 6.9*   CBG: Recent Labs  Lab 10/13/17 1650 10/14/17 0208  GLUCAP 86 166*   Lipid Profile: No results for input(s): CHOL, HDL, LDLCALC, TRIG, CHOLHDL, LDLDIRECT in the last 72 hours. Thyroid Function Tests: No results for input(s): TSH, T4TOTAL, FREET4, T3FREE, THYROIDAB in the last 72 hours. Anemia Panel: No results for input(s): VITAMINB12, FOLATE, FERRITIN, TIBC, IRON, RETICCTPCT in the last 72 hours. Sepsis Labs: Recent Labs  Lab 10/13/17 1807 10/13/17 2302  PROCALCITON 4.46  --     LATICACIDVEN 0.9 0.9    No results found for this or any previous visit (from the past 240 hour(s)).       Radiology Studies: Dg Chest 2 View  Result Date: 10/13/2017 CLINICAL DATA:  Weakness, altered mental status, shortness of breath and chest pain. EXAM: CHEST - 2 VIEW COMPARISON:  09/27/2017 FINDINGS: Heart size is normal. Pacemaker/AICD remains in place. Aortic atherosclerosis as seen previously. Pulmonary vascularity within normal limits. Lungs are hyperinflated in there are chronically increased markings. No evidence of active infiltrate, mass, effusion or collapse. IMPRESSION: Stable radiographic pattern. Chronically increased interstitial markings. No edema, consolidation or collapse. Electronically Signed   By: Nelson Chimes M.D.   On: 10/13/2017 14:49        Scheduled Meds: . heparin  5,000 Units Subcutaneous Q8H  .  insulin aspart  0-15 Units Subcutaneous TID WC  . mouth rinse  15 mL Mouth Rinse BID  . mometasone-formoterol  2 puff Inhalation BID  . pantoprazole (PROTONIX) IV  40 mg Intravenous Q24H  . tiotropium  18 mcg Inhalation Daily   Continuous Infusions: . sodium chloride 75 mL/hr at 10/14/17 0515  . piperacillin-tazobactam (ZOSYN)  IV 3.375 g (10/14/17 0508)     LOS: 1 day    Time spent: 35 minutes,     Elmarie Shiley, MD [facility] Pager [phone]  If 7PM-7AM, please contact night-coverage [email or web address] Password [facility] 10/14/2017, 7:59 AM

## 2017-10-14 NOTE — Evaluation (Signed)
Physical Therapy Evaluation [name] MRN: [id number] DOB: Feb 21, 1934 Today's Date: 10/14/2017   History of Present Illness  Antonio Ray is a 84 y.o. male with extensive medical history listed below, including recent hospitalization for influenza A with pneumonia, CAD, COPD, CKD stage III, and recent community-acquired pneumonia felt to be aspiration brought to the emergency department with increasing chest pain, worsening with inhalation and palpation.   Clinical Impression  Pt functioning at a min guard level. Pt slightly impulsive however this is his baseline as "i'm always doing things fast". Pt indep and living alone PTA even after d/c recently from having the flu. Pt with generalized weakness but tolerated mobility well. Acute PT to con't to monitor pt while in hospital.    Follow Up Recommendations No PT follow up;Supervision - Intermittent    Equipment Recommendations  None recommended by PT    Recommendations for Other Services       Precautions / Restrictions Precautions Precautions: Other (comment) Precaution Comments: watch O2 stats Restrictions Weight Bearing Restrictions: No      Mobility  Bed Mobility Overal bed mobility: Modified Independent             General bed mobility comments: HOB elevated and used bed rail  Transfers Overall transfer level: Needs assistance Equipment used: None Transfers: Sit to/from Stand Sit to Stand: Min guard         General transfer comment: pt slightly impulsive but per family "he moves fast all the time"  Ambulation/Gait Ambulation/Gait assistance: Min guard Ambulation Distance (Feet): 200 Feet Assistive device: None Gait Pattern/deviations: Step-through pattern Gait velocity: fast Gait velocity interpretation: at or above normal speed for age/gender General Gait Details: v/c's to slow down however pt and dtr reports "he has always walked fast, I can't ever keep up with him". SpO2  did drop below 80% on RA, pt placed on 2 LO2 via [place]  Stairs            Wheelchair Mobility    Modified Rankin (Stroke Patients Only)       Balance Overall balance assessment: Modified Independent                                           Pertinent Vitals/Pain Pain Assessment: No/denies pain    Home Living Family/patient expects to be discharged to:: Private residence Living Arrangements: Alone Available Help at Discharge: Family;Available PRN/intermittently Type of Home: House Home Access: Stairs to enter Entrance Stairs-Rails: Left;Right;Can reach both Entrance Stairs-Number of Steps: 3-7 Home Layout: Two level;Able to live on main level with bedroom/bathroom;Full bath on main level Home Equipment: Cane - single point;Walker - 2 wheels;Walker - 4 wheels;Shower seat;Grab bars - tub/shower;Hand held shower head Additional Comments: he walks daily for exercise    Prior Function Level of Independence: Independent         Comments: uses a walking stick when he walks in the woods.  Still drives, microwaves meals.  Family assists with filling medication boxes      Hand Dominance   Dominant Hand: Right    Extremity/Trunk Assessment   Upper Extremity Assessment Upper Extremity Assessment: Generalized weakness    Lower Extremity Assessment Lower Extremity Assessment: Generalized weakness    Cervical / Trunk Assessment Cervical / Trunk Assessment: Kyphotic  Communication   Communication: HOH  Cognition Arousal/Alertness: Awake/alert Behavior During Therapy: WFL for  tasks assessed/performed Overall Cognitive Status: Within Functional Limits for tasks assessed                                 General Comments: slighlty impulsive but just eager to get moving      General Comments      Exercises     Assessment/Plan    PT Assessment Patient needs continued PT services  PT Problem List Decreased strength;Decreased activity  tolerance;Decreased balance;Decreased mobility       PT Treatment Interventions DME instruction;Gait training;Stair training;Functional mobility training;Therapeutic activities;Therapeutic exercise;Balance training    PT Goals (Current goals can be found in the Care Plan section)  Acute Rehab PT Goals Patient Stated Goal: home asap PT Goal Formulation: With patient/family Time For Goal Achievement: 10/28/17 Potential to Achieve Goals: Good    Frequency Min 3X/week   Barriers to discharge        Co-evaluation               AM-PAC PT "6 Clicks" Daily Activity  Outcome Measure Difficulty turning over in bed (including adjusting bedclothes, sheets and blankets)?: None Difficulty moving from lying on back to sitting on the side of the bed? : None Difficulty sitting down on and standing up from a chair with arms (e.g., wheelchair, bedside commode, etc,.)?: None Help needed moving to and from a bed to chair (including a wheelchair)?: A Little Help needed walking in hospital room?: A Little Help needed climbing 3-5 steps with a railing? : A Little 6 Click Score: 21    End of Session Equipment Utilized During Treatment: Gait belt Activity Tolerance: Patient tolerated treatment well Patient left: in chair;with call bell/phone within reach;with chair alarm set;with family/visitor present Nurse Communication: Mobility status(O2 stats) PT Visit Diagnosis: Unsteadiness on feet (R26.81)    Time: 3235-5732 PT Time Calculation (min) (ACUTE ONLY): 33 min   Charges:   PT Evaluation $PT Eval Moderate Complexity: 1 Mod PT Treatments $Gait Training: 8-22 mins   PT G Codes:        Kittie Plater, PT, DPT Pager #: [phone] Office #: [phone]   [place] 10/14/2017, 2:54 PM

## 2017-10-14 NOTE — Evaluation (Signed)
Clinical/Bedside Swallow Evaluation Patient Details  Name: Antonio Ray MRN: [id number] Date of Birth: 1933-08-09  Today's Date: 10/14/2017 Time: SLP Start Time (ACUTE ONLY): 1058 SLP Stop Time (ACUTE ONLY): 1115 SLP Time Calculation (min) (ACUTE ONLY): 17 min  Past Medical History:  Past Medical History:  Diagnosis Date  . Achalasia   . AICD (automatic cardioverter/defibrillator) present 03/30/2011   Analyze ST study patient  . Anginal pain ([place])   . BPH (benign prostatic hyperplasia)   . CHF (congestive heart failure) ([place])   . Chronic systolic dysfunction of left ventricle    EF 30-35%, CLASS II - III SYMPTOMS; intolerant to Coreg  . CKD (chronic kidney disease) stage 3, GFR 30-59 ml/min ([name]) 06/08/2012  . COPD (chronic obstructive pulmonary disease) ([place])   . Coronary artery disease    History of remote anterior MI with PCI to LAD in 2006; most recent cath 2007, no intervention required  . DOE (dyspnea on exertion)    with heavy exertion  . Dysrhythmia   . Esophageal dysmotility   . GERD (gastroesophageal reflux disease)   . GERD (gastroesophageal reflux disease)   . Head injury, closed, with concussion 2000ish  . Heart murmur    hx  . HOH (hard of hearing)   . Hyperlipidemia   . Memory loss    improved  . Myocardial infarction ([place]) 1994   ANTERIOR  . Pneumonia "several times"   aspiration pna with at least 3 admits for this 2016.   Marland Kitchen PVC's (premature ventricular contractions)   . Renal failure   . Skin cancer "several"   "forearms; head"  . Type II diabetes mellitus ([place])    Past Surgical History:  Past Surgical History:  Procedure Laterality Date  . BALLOON DILATION N/A 09/09/2015   Procedure: BALLOON DILATION;  Surgeon: Mauri Pole, MD;  Location: [facility];  Service: Endoscopy;  Laterality: N/A;  rigiflex achalasia balloon dilators  . BOTOX INJECTION N/A 04/08/2015   Procedure: BOTOX INJECTION;  Surgeon: Milus Banister, MD;  Location: [facility];   Service: Endoscopy;  Laterality: N/A;  . CARDIAC CATHETERIZATION  01/11/2006   DEMONSTRATES AKINESIA OF THE DISTAL ANTERIOR WALL, DISTAL INFERIOR WALL AND AKINESIA OF THE APEX. THE BASAL SEGMENTS CONTRACT WELL AND OVERALL EF 35%  . CATARACT EXTRACTION W/ INTRAOCULAR LENS  IMPLANT, BILATERAL  08/2014-09/2014  . CHOLECYSTECTOMY  2/11  . CORONARY ANGIOPLASTY  1994   TO THE LAD  . ESOPHAGEAL MANOMETRY N/A 03/16/2015   Procedure: ESOPHAGEAL MANOMETRY (EM);  Surgeon: Jerene Bears, MD;  Location: [facility];  Service: Gastroenterology;  Laterality: N/A;  . ESOPHAGOGASTRODUODENOSCOPY N/A 04/08/2015   Procedure: ESOPHAGOGASTRODUODENOSCOPY (EGD);  Surgeon: Milus Banister, MD;  Location: [facility];  Service: Endoscopy;  Laterality: N/A;  . ESOPHAGOGASTRODUODENOSCOPY (EGD) WITH PROPOFOL N/A 09/09/2015   Procedure: ESOPHAGOGASTRODUODENOSCOPY (EGD) WITH PROPOFOL;  Surgeon: Mauri Pole, MD;  Location: [facility];  Service: Endoscopy;  Laterality: N/A;  pt. is to have gastrografin xray post recovery-do not discharge until results are back  . EYE SURGERY    . FOOT FRACTURE SURGERY Right 1980's  . INSERT / REPLACE / REMOVE PACEMAKER    . SKIN CANCER EXCISION  "several"   "forearms, head"   HPI:  84 y.o. male with extensive medical history listed below, including recent hospitalization for influenza A with pneumonia, CAD, COPD, CKD stage III, and recent community-acquired pneumonia felt to be aspiration brought to the emergency department with increasing chest pain, worsening with inhalation and palpation.  He has been followed by [facility] for his achalasia with most recent dilatation in feb 2017.  Pt has a long standing history of esophageal dysphagia with dx achalasia, dysmotility, and GERD. He presented with sob, elevated WBC and AKI. Chest CT 3/6 showed Multifocal tree-in-bud nodularity, right lung predominant, suggesting infectious bronchiolitis. Pt has been evaluated by SLP during previous admissions  most recently 02/16/2017; had MBS 11/2016 which revealed functional oropharyngeal swallow with no penetration or aspiration.    Assessment / Plan / Recommendation Clinical Impression  Patient presents with oropharyngeal swallow which appears WFL, consistent with prior evaluations. MBS 11/2016 did not reveal penetration or aspiration; swallowing was within normal limits. Pt has an ongoing risk of aspiration due to primary esophageal dysphagia. Pt and daughter verbalize understanding of aspiration risks and independently tell SLP strategies which pt uses to mitigate symptoms. He has cut back on spicy foods, pepper, and avoids tough breads, hulled fruits and vegetables. Pt reports globus sensation and coughing with meals when he eats too much. He passes 3 oz water swallow challenge without difficulty; no overt signs of aspiration. As pt consumed larger volumes of food and liquid, he did have belching and delayed coughing, again consistent with esophageal dysphagia. As pt and daughter are well-versed in strategies to manage pt's dysphagia, no further skilled ST is warranted. Recommend regular diet with thin liquids so that pt may self-select foods he tolerates well, meds whole with liquid. SLP will s/o.     SLP Visit Diagnosis: Dysphagia, unspecified (R13.10)    Aspiration Risk  Moderate aspiration risk(due to esophageal dysphagia)    Diet Recommendation Regular;Thin liquid(as tolerated; avoid tough breads, hulled vegetables)   Liquid Administration via: Cup;Straw Medication Administration: Whole meds with liquid Compensations: Slow rate;Small sips/bites;Follow solids with liquid Postural Changes: Seated upright at 90 degrees;Remain upright for at least 30 minutes after po intake    Other  Recommendations Oral Care Recommendations: Oral care BID   Follow up Recommendations None      Frequency and Duration            Prognosis        Swallow Study   General Date of Onset: 10/13/17 HPI: 84  y.o. male with extensive medical history listed below, including recent hospitalization for influenza A with pneumonia, CAD, COPD, CKD stage III, and recent community-acquired pneumonia felt to be aspiration brought to the emergency department with increasing chest pain, worsening with inhalation and palpation. He has been followed by [facility] for his achalasia with most recent dilatation in feb 2017.  He presented with sob, elevated WBC and AKI. Chest CT 3/6 showed Multifocal tree-in-bud nodularity, right lung predominant, suggesting infectious bronchiolitis. Pt has been evaluated by SLP during previous admissions most recently 02/16/2017; had MBS 11/2016 which revealed functional oropharyngeal swallow with no penetration or aspiration.  Type of Study: Bedside Swallow Evaluation Previous Swallow Assessment: see HPI Diet Prior to this Study: NPO Temperature Spikes Noted: No Respiratory Status: Nasal cannula History of Recent Intubation: No Behavior/Cognition: Alert;Cooperative;Pleasant mood Oral Cavity Assessment: Within Functional Limits Oral Care Completed by SLP: No Oral Cavity - Dentition: Adequate natural dentition Vision: Functional for self-feeding Self-Feeding Abilities: Able to feed self Patient Positioning: Upright in chair Baseline Vocal Quality: Normal Volitional Cough: Strong Volitional Swallow: Able to elicit    Oral/Motor/Sensory Function Overall Oral Motor/Sensory Function: Within functional limits   Ice Chips Ice chips: Not tested   Thin Liquid Thin Liquid: Within functional limits Presentation: Cup;Straw    Nectar  Thick Nectar Thick Liquid: Not tested   Honey Thick Honey Thick Liquid: Not tested   Puree Puree: Within functional limits Presentation: Spoon;Self Fed   Solid   GO   Solid: Within functional limits Presentation: [place], [place], [employer] Speech-Language Pathologist [phone]  Aliene Altes 10/14/2017,11:23 AM

## 2017-10-14 NOTE — Consult Note (Signed)
GI ATTENDING  Consult received.History reviewed.Not sure much to offer from GI perspective (see Dr. Woodward Ku office note May 2018). Right now the priority is medical treatment of pneumonia. Full GI consult to follow, likely tomorrow. Thank you  Docia Chuck. Geri Seminole., M.D. [facility] Division of Gastroenterology

## 2017-10-15 ENCOUNTER — Inpatient Hospital Stay (HOSPITAL_COMMUNITY): Payer: Medicare Other

## 2017-10-15 DIAGNOSIS — R072 Precordial pain: Secondary | ICD-10-CM

## 2017-10-15 DIAGNOSIS — K224 Dyskinesia of esophagus: Secondary | ICD-10-CM

## 2017-10-15 LAB — URINE CULTURE: Culture: <10000 — AB

## 2017-10-15 LAB — CBC
HCT: 36.8 % — ABNORMAL LOW (ref 39.0–52.0)
HEMOGLOBIN: 10.9 g/dL — AB (ref 13.0–17.0)
MCH: 26.3 pg (ref 26.0–34.0)
MCHC: 29.6 g/dL — ABNORMAL LOW (ref 30.0–36.0)
MCV: 88.9 fL (ref 78.0–100.0)
PLATELETS: 217 10*3/uL (ref 150–400)
RBC: 4.14 MIL/uL — AB (ref 4.22–5.81)
RDW: 17 % — ABNORMAL HIGH (ref 11.5–15.5)
WBC: 12.2 10*3/uL — AB (ref 4.0–10.5)

## 2017-10-15 LAB — BASIC METABOLIC PANEL
ANION GAP: 8 (ref 5–15)
BUN: 43 mg/dL — ABNORMAL HIGH (ref 6–20)
CHLORIDE: 107 mmol/L (ref 101–111)
CO2: 25 mmol/L (ref 22–32)
Calcium: 8.3 mg/dL — ABNORMAL LOW (ref 8.9–10.3)
Creatinine, Ser: 1.76 mg/dL — ABNORMAL HIGH (ref 0.61–1.24)
GFR calc Af Amer: 40 mL/min — ABNORMAL LOW (ref >60)
GFR, EST NON AFRICAN AMERICAN: 34 mL/min — AB (ref >60)
Glucose, Bld: 126 mg/dL — ABNORMAL HIGH (ref 65–99)
POTASSIUM: 4.3 mmol/L (ref 3.5–5.1)
SODIUM: 140 mmol/L (ref 135–145)

## 2017-10-15 LAB — GLUCOSE, CAPILLARY
GLUCOSE-CAPILLARY: 90 mg/dL (ref 65–99)
GLUCOSE-CAPILLARY: 126 mg/dL — AB (ref 65–99)
Glucose-Capillary: 102 mg/dL — ABNORMAL HIGH (ref 65–99)
Glucose-Capillary: 108 mg/dL — ABNORMAL HIGH (ref 65–99)

## 2017-10-15 MED ORDER — FUROSEMIDE 20 MG PO TABS
20.0000 mg | ORAL_TABLET | Freq: Every day | ORAL | Status: DC
  Administered 2017-10-16: 20 mg via ORAL
  Filled 2017-10-15: qty 1

## 2017-10-15 MED ORDER — PERFLUTREN LIPID MICROSPHERE
1.0000 mL | INTRAVENOUS | Status: AC | PRN
  Administered 2017-10-15: 4.5 mL via INTRAVENOUS
  Filled 2017-10-15: qty 10

## 2017-10-15 MED ORDER — PANTOPRAZOLE SODIUM 40 MG PO TBEC
40.0000 mg | DELAYED_RELEASE_TABLET | Freq: Two times a day (BID) | ORAL | Status: DC
  Administered 2017-10-15 – 2017-10-16 (×3): 40 mg via ORAL
  Filled 2017-10-15 (×3): qty 1

## 2017-10-15 NOTE — Progress Notes (Signed)
  Echocardiogram 2D Echocardiogram has been performed.  Antonio Ray 10/15/2017, 6:32 PM

## 2017-10-15 NOTE — Consult Note (Addendum)
Consultation  Referring Provider:[facility] hospitalist/Dr. Tyrell Antonio  Primary Care Physician:  [name], MD Primary Gastroenterologist:  Dr.Nandigam  Reason for Consultation:  Recurrent pneumonia, question aspiration related with history of achalasia  HPI: Antonio Ray is a 84 y.o. male , no known to Dr. Silverio Decamp with history of achalasia. He had undergone previous Botox injections and had a pneumatic dilation of the esophagus in February 2017 with improvement in symptoms. He was last seen in our office in May 2018 and at that time was doing well. Not having any significant dysphagia but had had an episode of aspiration pneumonia. Barium swallow showed obstruction to passage of a barium tablet but was felt to have good flow of liquid barium. He also had speech path evaluation in May 2018 which showed that he had good oral pharyngeal swallowing function and no evidence of aspiration or penetration, and no restrictions to diet were recommended. Patient was hospitalized on 10/13/2017.appendectomy had onset of increased confusion, chills and was complaining of right-sided chest discomfort and increased weakness. Chest x-ray showed chronically increased interstitial markings no edema consolidation or collapse. CT of the chest was done as an outpatient on 10/04/2017 and showed a dilated fluid-filled esophagus. There was multifocal tree and but nodularity right lung predominant suggesting infectious bronchiolitis chronic atypical mycobacterial infection and possible.  Patient has been seen in consultation by pulmonary/Dr. Curt Jews yesterday and felt to have acute tracheal bronchitis versus aspiration pneumonia and acute on chronic respiratory failure with hypoxia. He is improving on IV antibiotics. We are asked to evaluate regarding role of his achalasia and recurrent episodes of pneumonia. Patient admits that he has had increased difficulty swallowing over the past several months. He says he is  eating about half as much as he used to because he can feel his esophagus filling up. He gets a pressure kind of sensation in his upper esophagus and knows it'stime to stop eating. He keeps eating he will regurgitate. He has been having occasional episodes of regurgitation at home postprandially.no significant heartburn or indigestion. His daughter who is in the room today has been bringing him regular food, they tried to encourage him to eat slowly, and smaller amounts at a time. He also has been drinking an answer everyday.   Past Medical History:  Diagnosis Date  . Achalasia   . AICD (automatic cardioverter/defibrillator) present 03/30/2011   Analyze ST study patient  . Anginal pain ([place])   . BPH (benign prostatic hyperplasia)   . CHF (congestive heart failure) ([place])   . Chronic systolic dysfunction of left ventricle    EF 30-35%, CLASS II - III SYMPTOMS; intolerant to Coreg  . CKD (chronic kidney disease) stage 3, GFR 30-59 ml/min ([name]) 06/08/2012  . COPD (chronic obstructive pulmonary disease) ([place])   . Coronary artery disease    History of remote anterior MI with PCI to LAD in 2006; most recent cath 2007, no intervention required  . DOE (dyspnea on exertion)    with heavy exertion  . Dysrhythmia   . Esophageal dysmotility   . GERD (gastroesophageal reflux disease)   . GERD (gastroesophageal reflux disease)   . Head injury, closed, with concussion 2000ish  . Heart murmur    hx  . HOH (hard of hearing)   . Hyperlipidemia   . Memory loss    improved  . Myocardial infarction ([place]) 1994   ANTERIOR  . Pneumonia "several times"   aspiration pna with at least 3 admits for this 2016.   Marland Kitchen  PVC's (premature ventricular contractions)   . Renal failure   . Skin cancer "several"   "forearms; head"  . Type II diabetes mellitus ([place])     Past Surgical History:  Procedure Laterality Date  . BALLOON DILATION N/A 09/09/2015   Procedure: BALLOON DILATION;  Surgeon: Mauri Pole, MD;   Location: [facility];  Service: Endoscopy;  Laterality: N/A;  rigiflex achalasia balloon dilators  . BOTOX INJECTION N/A 04/08/2015   Procedure: BOTOX INJECTION;  Surgeon: Milus Banister, MD;  Location: [facility];  Service: Endoscopy;  Laterality: N/A;  . CARDIAC CATHETERIZATION  01/11/2006   DEMONSTRATES AKINESIA OF THE DISTAL ANTERIOR WALL, DISTAL INFERIOR WALL AND AKINESIA OF THE APEX. THE BASAL SEGMENTS CONTRACT WELL AND OVERALL EF 35%  . CATARACT EXTRACTION W/ INTRAOCULAR LENS  IMPLANT, BILATERAL  08/2014-09/2014  . CHOLECYSTECTOMY  2/11  . CORONARY ANGIOPLASTY  1994   TO THE LAD  . ESOPHAGEAL MANOMETRY N/A 03/16/2015   Procedure: ESOPHAGEAL MANOMETRY (EM);  Surgeon: Jerene Bears, MD;  Location: [facility];  Service: Gastroenterology;  Laterality: N/A;  . ESOPHAGOGASTRODUODENOSCOPY N/A 04/08/2015   Procedure: ESOPHAGOGASTRODUODENOSCOPY (EGD);  Surgeon: Milus Banister, MD;  Location: [facility];  Service: Endoscopy;  Laterality: N/A;  . ESOPHAGOGASTRODUODENOSCOPY (EGD) WITH PROPOFOL N/A 09/09/2015   Procedure: ESOPHAGOGASTRODUODENOSCOPY (EGD) WITH PROPOFOL;  Surgeon: Mauri Pole, MD;  Location: [facility];  Service: Endoscopy;  Laterality: N/A;  pt. is to have gastrografin xray post recovery-do not discharge until results are back  . EYE SURGERY    . FOOT FRACTURE SURGERY Right 1980's  . INSERT / REPLACE / REMOVE PACEMAKER    . SKIN CANCER EXCISION  "several"   "forearms, head"    Prior to Admission medications   Medication Sig Start Date End Date Taking? Authorizing Provider  albuterol (PROVENTIL HFA;VENTOLIN HFA) 108 (90 Base) MCG/ACT inhaler Inhale 2 puffs into the lungs every 6 (six) hours as needed for wheezing or shortness of breath. 04/22/16  Yes Eugenie Filler, MD  aspirin 81 MG EC tablet Take 81 mg by mouth daily.     Yes [provider]  Cholecalciferol (VITAMIN D) 2000 units tablet Take 2,000 Units by mouth daily.    Yes [provider]    donepezil (ARICEPT) 10 MG tablet Take 1 tablet (10 mg total) by mouth every morning. 05/01/17  Yes Marcial Pacas, MD  fenofibrate (TRICOR) 145 MG tablet TAKE 1 TABLET BY MOUTH EVERY DAY Patient taking differently: Take 145 mg by mouth once a day 02/10/17  Yes Martinique, Peter M, MD  finasteride (PROSCAR) 5 MG tablet Take 5 mg by mouth daily. 05/19/17  Yes [provider]  furosemide (LASIX) 20 MG tablet Take 2 tablets (40 mg total) by mouth daily. Patient taking differently: Take 20 mg by mouth daily.  11/22/16  Yes Martinique, Peter M, MD  insulin aspart (NOVOLOG FLEXPEN) 100 UNIT/ML FlexPen Inject 5-6 Units into the skin See admin instructions. Patient taking differently: Inject 4 Units into the skin 3 (three) times daily before meals.  10/02/17  Yes Philemon Kingdom, MD  Insulin Glargine (BASAGLAR KWIKPEN) 100 UNIT/ML SOPN INJECT 10 UNITS UNDER THE SKIN EVERY MORNING 10/02/17  Yes Philemon Kingdom, MD  loratadine (CLARITIN) 10 MG tablet Take 1 tablet (10 mg total) by mouth daily. 07/02/15  Yes Barton Dubois, MD  memantine (NAMENDA) 10 MG tablet Take 1 tablet (10 mg total) by mouth 2 (two) times daily. 05/01/17  Yes Marcial Pacas, MD  Multiple Vitamins-Minerals (PRESERVISION AREDS  2) CAPS Take 1 capsule by mouth 2 (two) times daily.   Yes [provider]  nitroGLYCERIN (NITRODUR - DOSED IN MG/24 HR) 0.2 mg/hr patch Place 1 patch (0.2 mg total) onto the skin daily. Alternates patch placement with odd/even days. 07/18/17  Yes Martinique, Peter M, MD  pantoprazole (PROTONIX) 20 MG tablet TAKE 1 TABLET BY MOUTH DAILY Patient taking differently: Take 20 mg by mouth once a day 08/24/17  Yes Nandigam, Venia Minks, MD  pravastatin (PRAVACHOL) 40 MG tablet Take 1 tablet (40 mg total) by mouth at bedtime. 05/26/17  Yes Martinique, Peter M, MD  SPIRIVA HANDIHALER 18 MCG inhalation capsule INHALE CONTENTS OF 1 CAPSULE VIA HANDIHALER ONCE DAILY 06/08/17  Yes Juanito Doom, MD  SYMBICORT 160-4.5 MCG/ACT inhaler  INHALE 2 PUFFS BY MOUTH TWICE DAILY Patient taking differently: Inhale 2 puffs into the lungs two times a day 07/31/17  Yes Juanito Doom, MD    Current Facility-Administered Medications  Medication Dose Route Frequency Provider Last Rate Last Dose  . acetaminophen (TYLENOL) tablet 650 mg  650 mg Oral Q6H PRN Rondel Jumbo, PA-C       Or  . acetaminophen (TYLENOL) suppository 650 mg  650 mg Rectal Q6H PRN Rondel Jumbo, PA-C      . bisacodyl (DULCOLAX) suppository 10 mg  10 mg Rectal Daily PRN Rondel Jumbo, PA-C      . donepezil (ARICEPT) tablet 10 mg  10 mg Oral q morning - 10a Regalado, Belkys A, MD   10 mg at 10/15/17 1113  . heparin injection 5,000 Units  5,000 Units Subcutaneous Q8H Rondel Jumbo, PA-C   5,000 Units at 10/15/17 6073  . insulin aspart (novoLOG) injection 0-15 Units  0-15 Units Subcutaneous TID WC Rondel Jumbo, PA-C   3 Units at 10/14/17 1816  . MEDLINE mouth rinse  15 mL Mouth Rinse BID Reyne Dumas, MD   15 mL at 10/15/17 1114  . memantine (NAMENDA) tablet 10 mg  10 mg Oral BID Regalado, Belkys A, MD   10 mg at 10/15/17 1114  . mometasone-formoterol (DULERA) 200-5 MCG/ACT inhaler 2 puff  2 puff Inhalation BID Rondel Jumbo, PA-C   2 puff at 10/15/17 0745  . ondansetron (ZOFRAN) tablet 4 mg  4 mg Oral Q6H PRN Rondel Jumbo, PA-C       Or  . ondansetron (ZOFRAN) injection 4 mg  4 mg Intravenous Q6H PRN Wertman, Sara E, PA-C      . pantoprazole (PROTONIX) EC tablet 40 mg  40 mg Oral BID Regalado, Belkys A, MD      . piperacillin-tazobactam (ZOSYN) IVPB 3.375 g  3.375 g Intravenous Q8H Rolla Flatten, [facility]   Stopped at 10/15/17 0815  . senna-docusate (Senokot-S) tablet 1 tablet  1 tablet Oral QHS PRN Rondel Jumbo, PA-C      . tiotropium [facility]) inhalation capsule 18 mcg  18 mcg Inhalation Daily Rondel Jumbo, PA-C   18 mcg at 10/15/17 0745    Allergies as of 10/13/2017 - Review Complete 10/13/2017  Allergen Reaction Noted  . Codeine Other  (See Comments)   . Dilaudid [hydromorphone hcl] Other (See Comments) 09/10/2013  . Flomax [tamsulosin hcl] Shortness Of Breath 05/19/2012  . Morphine and related Other (See Comments) 05/20/2011  . Sulfa antibiotics Shortness Of Breath 05/23/2012  . Beta adrenergic blockers Other (See Comments) 03/31/2015  . Carvedilol Other (See Comments) 04/18/2011    Family History  Problem Relation Age  of Onset  . Stroke Father        Mother history unknown - never met her  . Colon cancer Neg Hx     Social History   Socioeconomic History  . Marital status: Widowed    Spouse name: Not on file  . Number of children: 2  . Years of education: GED  . Highest education level: Not on file  Social Needs  . Financial resource strain: Patient refused  . Food insecurity - worry: Never true  . Food insecurity - inability: Never true  . Transportation needs - medical: No  . Transportation needs - non-medical: No  Occupational History  . Occupation: Clinical biochemist: [employer]    Comment: Retired  . Occupation: [employer]    Employer: [employer]    Comment: Retired  Tobacco Use  . Smoking status: Former Smoker    Packs/day: 1.00    Years: 35.00    Pack years: 35.00    Types: Cigarettes    Last attempt to quit: 08/01/1992    Years since quitting: 25.2  . Smokeless tobacco: Former Systems developer    Quit date: 1994  Substance and Sexual Activity  . Alcohol use: No    Alcohol/week: 0.0 oz  . Drug use: No  . Sexual activity: Not Currently  Other Topics Concern  . Not on file  Social History Narrative   Pt lives in [place] alone.  Widowed.   Right-handed.   Rare caffeine use - maybe one cup per month.    Review of Systems: Pertinent positive and negative review of systems were noted in the above HPI section.  All other review of systems was otherwise negative.  Physical Exam: Vital signs in last 24 hours: Temp:  [97.9 F (36.6 C)-98.6 F (37 C)] 97.9 F (36.6 C) (03/17  0521) Pulse Rate:  [58-60] 59 (03/17 0521) Resp:  [16-24] 24 (03/17 0521) BP: (94-106)/(45-47) 94/45 (03/17 0521) SpO2:  [95 %-98 %] 95 % (03/17 0745) Weight:  [161 lb 2.5 oz (73.1 kg)] 161 lb 2.5 oz (73.1 kg) (03/17 0521)   General:   Alert,  Well-developed, well-nourished,elderly white male sitting in a chair pleasant and cooperative in NAD, daughter at bedside, patient using oral suction Head:  Normocephalic and atraumatic. Eyes:  Sclera clear, no icterus.   Conjunctiva pink. Ears:  Normal auditory acuity. Nose:  No deformity, discharge,  or lesions. Mouth:  No deformity or lesions.   Neck:  Supple; no masses or thyromegaly. Lungs:  Rhonchi admitted lower lung Heart:  Regular rate and rhythm; no murmurs, clicks, rubs,  or gallops. Abdomen:  Soft,nontender, BS active,nonpalp mass or hsm.   Rectal:  Deferred  Msk:  Symmetrical without gross deformities. . Pulses:  Normal pulses noted. Extremities:  Without clubbing or edema. Neurologic:  Alert and  oriented x4;  grossly normal neurologically. Skin:  Intact without significant lesions or rashes.. Psych:  Alert and cooperative. Normal mood and affect.  Intake/Output from previous day: 03/16 0701 - 03/17 0700 In: -  Out: 1150 [Urine:1150] Intake/Output this shift: No intake/output data recorded.  Lab Results: Recent Labs    10/13/17 1358 10/14/17 0533 10/15/17 0512  WBC 25.1* 14.7* 12.2*  HGB 12.7* 11.9* 10.9*  HCT 41.0 38.9* 36.8*  PLT 250 237 217   BMET Recent Labs    10/13/17 1401 10/14/17 0533 10/15/17 0512  NA 140 140 140  K 4.0 4.2 4.3  CL 104 104 107  CO2 25  23 25  GLUCOSE 89 186* 126*  BUN 41* 45* 43*  CREATININE 2.06* 1.94* 1.76*  CALCIUM 9.2 8.6* 8.3*   LFT Recent Labs    10/13/17 1401  PROT 6.7  ALBUMIN 3.2*  AST 37  ALT 21  ALKPHOS 41  BILITOT 0.9   PT/INR Recent Labs    10/14/17 0533  LABPROT 15.5*  INR 1.24       IMPRESSION:  #59 84 year old white male with known achalasia  status post previous Botox injections and pneumatic dilation per Dr. Silverio Decamp, February 2017, currently admitted with acute tracheobronchitis versus aspiration pneumonia. Patient has had history of recurrent aspiration pneumonia #2 COPD #3 dementia #4 congestive heart failure status post AICD #5 chronic kidney disease stage III #6 diabetes mellitus #7 coronary artery disease status post angioplasty  Plan; Continue twice daily PPI Keep head of bed available elevated at least 45 at all times and patient remain upright in a chair for at least 2 hours after each meal Full liquid to very soft diet was discussed in detail with the patient and daughter. I suspect he would do better if he stayed on a full liquid diet which he does not wish to pursue.  He may benefit from repeat Botox injection short-term or possible consideration of repeat pneumatic dilation which does carry risk of perforation. Patient needs to recover from his current episode of pneumonia, and we will arrange for outpatient appointment with Dr. Silverio Decamp for patient and his family to discuss potential further endoscopic intervention for his achalasia. Our office will call his son who usually comes to appointments with him and schedule appointment for 2-3 weeks for now.  Thank you, GI will sign off.  Amy Esterwood  10/15/2017, 1:27 PM   GI ATTENDING  History, laboratory as, x-rays, previous endoscopy reports reviewed. Patient personally seen and examined. Agree with comprehensive consultation note as outlined above. Daughter in room. Patient with recurrent aspiration pneumonia. Questioning whether achalasia is playing a role. Previous formal swallowing evaluation reveals no significant oropharyngeal dysphagia. It seems patient did receive some improvement with his swallowing or "food stacking up" post pneumatic dilation. More recently it seems that he has had difficulties. At this point, adequate treatment of pneumonia. In Terms of  swallowing, could consider Botox injection as he is a bit ambivalent about considering repeat pneumatic dilation. Repeat barium esophagram may be reasonable as well to compare to his post dilation esophagram which had improved over his pre-dilation esophagram. In any event, we will help arrange outpatient follow-up with Dr. Silverio Decamp so that she may discuss with him his options. Thank your  Docia Chuck. Geri Seminole., M.D. [facility] Division of Gastroenterology

## 2017-10-15 NOTE — Progress Notes (Signed)
PROGRESS NOTE    Antonio Ray  LPF:[id number] DOB: Dec 06, 1933 DOA: 10/13/2017 PCP: [name], MD    Brief Narrative: Antonio Ray is a 84 y.o. male with extensive medical history listed below, including recent hospitalization for influenza A with pneumonia, CAD, COPD, CKD stage III, and recent community-acquired pneumonia felt to be aspiration brought to the emergency department with increasing chest pain, worsening with inhalation and palpation.  The chest pain mostly radiated to the right side, accompanied by some shortness of breath, but no cough.  The patient was wheezing on presentation, requiring albuterol in route, and was placed on O2 nasal cannula, for improvement of his respiratory status.  Of note, the patient does not wear home oxygen.  He denies any fever or chills.  He denies any abdominal pain, nausea or vomiting.  Of note, the patient does have history of achalasia, and had several evaluations in the past by GI, including Botox injections per report.  Last procedure was in 2017.  He denies any diarrhea.  He denies any recent medication changes except Lasix, which was used from 40-20 mg as an outpatient about 1 month ago.  Patient denies any significant lower extremity swelling.  No confusion is reported.  He denies any vision changes, or headaches.   Assessment & Plan:   Principal Problem:   Pneumonia, aspiration ([place]) Active Problems:   Coronary artery disease   Sick sinus syndrome ([name])   Acute on chronic combined systolic and diastolic congestive heart failure, NYHA class 3 ([name])   Hyperlipidemia   PVC's (premature ventricular contractions)   S/P ICD (internal cardiac defibrillator) procedure   BPH (benign prostatic hyperplasia)   GERD (gastroesophageal reflux disease)   COPD GOLD IV   Esophageal dysmotilities   Achalasia   Cardiomyopathy, ischemic   Chronic kidney disease, stage III (moderate) ([name])   Type 2 diabetes mellitus with stage 3 chronic kidney  disease, with long-term current use of insulin ([name])   Mild cognitive impairment   HOH (hard of hearing)  1-Acute hypoxic respiratory failure; related to Aspiration PNA, tracheobronchitis.  Presents with chills, dyspnea, pleuritic chest pain.  Continue with Zosyn.  WBC trending down.  CCM consulted.  Chest x ray stable.   2-Achalasia;  GI consulted.  They  recommend follow up in the office in 2 weeks.  Will change PPI to BID>   Chest pain, pleuritic.  Might be related to PNA>  Check Doppler LE.  Mild elevated troponin. Check ECHO.  Improved.   Acute on chronic Diastolic HF;  Hold lasix due to soft BP.   HTN; resume lasix.   CKD; stage II. Cr baseline 1.6--1.5 Resume lasix tomorrow.  Cr peak to 2-- trending down to 1.9.   DM;  Hold glargine. SSI.   History of Dementia  Resume  Namenda   Benign prostatic hypertrophy, no acute issues On Proscar, hold for now. Due to soft BP     DVT prophylaxis: heparin.  Code Status: full code.  Family Communication: daughter at bedside.  Disposition Plan: home tomorrow.    Consultants:   Pulmonologist   GI  Procedures:  ECHO   Antimicrobials:  Zosyn 3-15  Subjective: Report cough improved. Breathing better, pleuritic chest pain improved.   Objective: Vitals:   10/14/17 2142 10/15/17 0521 10/15/17 0745 10/15/17 1457  BP: (!) 106/47 (!) 94/45  (!) 117/57  Pulse: 60 (!) 59  61  Resp: 20 (!) 24  18  Temp: 98.6 F (37 C) 97.9 F (  36.6 C)  97.6 F (36.4 C)  TempSrc: Oral Oral  Oral  SpO2: 97% 95% 95% 100%  Weight:  73.1 kg (161 lb 2.5 oz)    Height:        Intake/Output Summary (Last 24 hours) at 10/15/2017 1619 Last data filed at 10/15/2017 0520 Gross per 24 hour  Intake -  Output 1150 ml  Net -1150 ml   Filed Weights   10/13/17 2059 10/14/17 0500 10/15/17 0521  Weight: 70.5 kg (155 lb 6.8 oz) 70.6 kg (155 lb 10.3 oz) 73.1 kg (161 lb 2.5 oz)    Examination:  General exam: Appears calm and  comfortable  Respiratory system: bilateral ronchus.  Respiratory effort normal. Cardiovascular system: S1 & S2 heard, RRR. No JVD, murmurs, rubs, gallops or clicks. No pedal edema. Gastrointestinal system: Abdomen is nondistended, soft and nontender. No organomegaly or masses felt. Normal bowel sounds heard. Central nervous system: Alert and oriented. No focal neurological deficits. Extremities: Symmetric 5 x 5 power. Skin: No rashes, lesions or ulcers    Data Reviewed: I have personally reviewed following labs and imaging studies  CBC: Recent Labs  Lab 10/13/17 1358 10/14/17 0533 10/15/17 0512  WBC 25.1* 14.7* 12.2*  HGB 12.7* 11.9* 10.9*  HCT 41.0 38.9* 36.8*  MCV 87.8 88.8 88.9  PLT 250 237 031   Basic Metabolic Panel: Recent Labs  Lab 10/13/17 1401 10/14/17 0533 10/15/17 0512  NA 140 140 140  K 4.0 4.2 4.3  CL 104 104 107  CO2 25 23 25   GLUCOSE 89 186* 126*  BUN 41* 45* 43*  CREATININE 2.06* 1.94* 1.76*  CALCIUM 9.2 8.6* 8.3*   GFR: Estimated Creatinine Clearance: 31.3 mL/min (A) (by C-G formula based on SCr of 1.76 mg/dL (H)). Liver Function Tests: Recent Labs  Lab 10/13/17 1401  AST 37  ALT 21  ALKPHOS 41  BILITOT 0.9  PROT 6.7  ALBUMIN 3.2*   No results for input(s): LIPASE, AMYLASE in the last 168 hours. No results for input(s): AMMONIA in the last 168 hours. Coagulation Profile: Recent Labs  Lab 10/14/17 0533  INR 1.24   Cardiac Enzymes: Recent Labs  Lab 10/13/17 1807 10/13/17 2302 10/14/17 0533  TROPONINI 0.05* 0.04* 0.03*   BNP (last 3 results) No results for input(s): PROBNP in the last 8760 hours. HbA1C: Recent Labs    10/13/17 1807  HGBA1C 6.9*   CBG: Recent Labs  Lab 10/14/17 1211 10/14/17 1626 10/14/17 2140 10/15/17 0813 10/15/17 1250  GLUCAP 170* 178* 138* 90 102*   Lipid Profile: No results for input(s): CHOL, HDL, LDLCALC, TRIG, CHOLHDL, LDLDIRECT in the last 72 hours. Thyroid Function Tests: No results for  input(s): TSH, T4TOTAL, FREET4, T3FREE, THYROIDAB in the last 72 hours. Anemia Panel: No results for input(s): VITAMINB12, FOLATE, FERRITIN, TIBC, IRON, RETICCTPCT in the last 72 hours. Sepsis Labs: Recent Labs  Lab 10/13/17 1807 10/13/17 2302  PROCALCITON 4.46  --   LATICACIDVEN 0.9 0.9    Recent Results (from the past 240 hour(s))  Culture, blood (Routine X 2) w Reflex to ID Panel     Status: None (Preliminary result)   Collection Time: 10/13/17  4:55 PM  Result Value Ref Range Status   Specimen Description BLOOD LEFT FOREARM  Final   Special Requests   Final    BOTTLES DRAWN AEROBIC AND ANAEROBIC Blood Culture adequate volume   Culture   Final    NO GROWTH 2 DAYS Performed at [facility], [place]  [address], [place], [place] [address]    Report Status PENDING  Incomplete  Culture, blood (Routine X 2) w Reflex to ID Panel     Status: None (Preliminary result)   Collection Time: 10/13/17  6:30 PM  Result Value Ref Range Status   Specimen Description BLOOD LEFT FOREARM  Final   Special Requests IN PEDIATRIC BOTTLE Blood Culture adequate volume  Final   Culture   Final    NO GROWTH 2 DAYS Performed at [facility], [place] [address] [place], [place] [address]    Report Status PENDING  Incomplete  Culture, Urine     Status: Abnormal   Collection Time: 10/13/17 11:46 PM  Result Value Ref Range Status   Specimen Description URINE, CLEAN CATCH  Final   Special Requests NONE  Final   Culture (A)  Final    <10,000 COLONIES/mL INSIGNIFICANT GROWTH Performed at [facility], [place] [address], [place],  [address]    Report Status 10/15/2017 FINAL  Final         Radiology Studies: Dg Chest 2 View  Result Date: 10/14/2017 CLINICAL DATA:  84 year old male with right side chest pain. Shortness of breath. EXAM: CHEST - 2 VIEW COMPARISON:  10/13/2017 and earlier. FINDINGS: Upright AP and lateral views of the chest. Stable large lung volumes with increased AP  dimension to the chest. No pneumothorax, pulmonary edema, pleural effusion or confluent pulmonary opacity. Stable cardiac size and mediastinal contours. Stable left chest AICD. Calcified aortic atherosclerosis. No acute osseous abnormality identified. Paucity of bowel gas in the upper abdomen. IMPRESSION: Stable pulmonary hyperinflation. No acute cardiopulmonary abnormality. Electronically Signed   By: Genevie Ann M.D.   On: 10/14/2017 19:58        Scheduled Meds: . donepezil  10 mg Oral q morning - 10a  . heparin  5,000 Units Subcutaneous Q8H  . insulin aspart  0-15 Units Subcutaneous TID WC  . mouth rinse  15 mL Mouth Rinse BID  . memantine  10 mg Oral BID  . mometasone-formoterol  2 puff Inhalation BID  . pantoprazole  40 mg Oral BID  . tiotropium  18 mcg Inhalation Daily   Continuous Infusions: . piperacillin-tazobactam (ZOSYN)  IV 3.375 g (10/15/17 1441)     LOS: 2 days    Time spent: 35 minutes,     Elmarie Shiley, MD [facility] Pager [phone]  If 7PM-7AM, please contact night-coverage [email or web address] Password [facility] 10/15/2017, 4:19 PM

## 2017-10-16 ENCOUNTER — Telehealth: Payer: Self-pay

## 2017-10-16 ENCOUNTER — Inpatient Hospital Stay (HOSPITAL_COMMUNITY): Payer: Medicare Other

## 2017-10-16 DIAGNOSIS — R609 Edema, unspecified: Secondary | ICD-10-CM

## 2017-10-16 DIAGNOSIS — M79609 Pain in unspecified limb: Secondary | ICD-10-CM

## 2017-10-16 DIAGNOSIS — J441 Chronic obstructive pulmonary disease with (acute) exacerbation: Secondary | ICD-10-CM

## 2017-10-16 DIAGNOSIS — R0602 Shortness of breath: Secondary | ICD-10-CM

## 2017-10-16 DIAGNOSIS — R0902 Hypoxemia: Secondary | ICD-10-CM

## 2017-10-16 LAB — GLUCOSE, CAPILLARY
GLUCOSE-CAPILLARY: 166 mg/dL — AB (ref 65–99)
GLUCOSE-CAPILLARY: 124 mg/dL — AB (ref 65–99)
Glucose-Capillary: 86 mg/dL (ref 65–99)

## 2017-10-16 LAB — ECHOCARDIOGRAM COMPLETE
Height: 68 in
Weight: 2578.5 oz

## 2017-10-16 MED ORDER — FUROSEMIDE 20 MG PO TABS: 20.0000 mg | ORAL_TABLET | Freq: Every day | ORAL | 0 refills | Status: DC

## 2017-10-16 MED ORDER — BASAGLAR KWIKPEN 100 UNIT/ML ~~LOC~~ SOPN: PEN_INJECTOR | SUBCUTANEOUS | 0 refills | Status: DC

## 2017-10-16 MED ORDER — ALBUTEROL SULFATE HFA 108 (90 BASE) MCG/ACT IN AERS: 2.0000 | INHALATION_SPRAY | Freq: Four times a day (QID) | RESPIRATORY_TRACT | 2 refills | Status: DC | PRN

## 2017-10-16 MED ORDER — AMOXICILLIN-POT CLAVULANATE 875-125 MG PO TABS: 1.0000 | ORAL_TABLET | Freq: Two times a day (BID) | ORAL | 0 refills | Status: AC

## 2017-10-16 MED ORDER — PANTOPRAZOLE SODIUM 40 MG PO TBEC: 40.0000 mg | DELAYED_RELEASE_TABLET | Freq: Two times a day (BID) | ORAL | 0 refills | Status: DC

## 2017-10-16 NOTE — Progress Notes (Signed)
Occupational Therapy Treatment [name] MRN: [id number] DOB: 1934/06/06 Today's Date: 10/16/2017    History of present illness Pt is an 85 y.o. male with extensive medical history including achalasia, chronic systolic dysfunction of L ventricle, CKD, COPD, coronary artery disease, dyspnea on exertion, dysrhythmia, esophageal dysmotility, GERD, heart murmur, hard of hearing, hyperlipidemia, memory loss, myocardial infarction, pneumonia, PVC's, renal failure, skin cancer, type II diabetes mellitus. Admitted with with increasing chest pain, worsening with inhalation and palpation..   OT comments  Pt progressing towards goals, presents sitting up in recliner willing to participate in OT session. Pt completed room level functional mobility without AD, toilet transfer and standing grooming ADLs with supervision-minguard throughout. Initiated education regarding energy conservation during functional task completion after return home. Feel POC remains appropriate at this time. Will continue to follow while in acute setting to progress pt towards established OT goals.    Follow Up Recommendations  No OT follow up;Supervision - Intermittent    Equipment Recommendations  None recommended by OT          Precautions / Restrictions Precautions Precautions: Other (comment) Precaution Comments: watch O2 stats Restrictions Weight Bearing Restrictions: No       Mobility Bed Mobility Overal bed mobility: Modified Independent             General bed mobility comments: OOB in recliner on my arrival.   Transfers Overall transfer level: Needs assistance Equipment used: None Transfers: Sit to/from Stand Sit to Stand: Supervision         General transfer comment: supervision for safety    Balance Overall balance assessment: Mild deficits observed, not formally tested                                         ADL either performed or assessed  with clinical judgement   ADL Overall ADL's : Needs assistance/impaired     Grooming: Supervision/safety;Standing                   Toilet Transfer: Ambulation;Regular Toilet;Grab bars;Min guard Toilet Transfer Details (indicate cue type and reason): intermittently holding on to furniture while ambulating Toileting- Clothing Manipulation and Hygiene: Sit to/from Social worker Details (indicate cue type and reason): pt reports he has grab bars and shower seat in shower; educated on use of items for increased safety and as a means of conserving energy Functional mobility during ADLs: Min guard;Supervision/safety General ADL Comments: Pt completing room level functional mobility, toilet transfer, and standing grooming ADLs; began education regarding energy conservation and safety at home during ADL task completion; pt maintained on 2L O2 during session completion with sats remaining >90%                       Cognition Arousal/Alertness: Awake/alert Behavior During Therapy: WFL for tasks assessed/performed Overall Cognitive Status: No family/caregiver present to determine baseline cognitive functioning                                 General Comments: mildly impulsive, but appears to be baseline for pt; pt does demonstrate mild confusion when holding conversation/asking questions regarding energy conservation                     General Comments  SpO2 96% at rest. Ambulated on RA with desat to 78%. Placed on 2 L O2 upon return to recliner with SpO2 increase to 90% after 1 minute. Pt reports he has home O2 but doesn't use it.     Pertinent Vitals/ Pain       Pain Assessment: No/denies pain                                                          Frequency  Min 2X/week        Progress Toward Goals  OT Goals(current goals can now be found in the care plan section)  Progress towards OT  goals: Progressing toward goals  Acute Rehab OT Goals Patient Stated Goal: home asap OT Goal Formulation: With patient Time For Goal Achievement: 10/28/17 Potential to Achieve Goals: Good  Plan Discharge plan remains appropriate    AM-PAC PT "6 Clicks" Daily Activity     Outcome Measure   Help from another person eating meals?: None Help from another person taking care of personal grooming?: A Little Help from another person toileting, which includes using toliet, bedpan, or urinal?: A Little Help from another person bathing (including washing, rinsing, drying)?: A Little Help from another person to put on and taking off regular upper body clothing?: None Help from another person to put on and taking off regular lower body clothing?: A Little 6 Click Score: 20    End of Session Equipment Utilized During Treatment: Gait belt;Oxygen  OT Visit Diagnosis: Other abnormalities of gait and mobility (R26.89);Muscle weakness (generalized) (M62.81);Other symptoms and signs involving cognitive function   Activity Tolerance Patient tolerated treatment well   Patient Left in chair;with call bell/phone within reach;with chair alarm set   Nurse Communication Mobility status        Time: 7824-2353 OT Time Calculation (min): 20 min  Charges: OT General Charges $OT Visit: 1 Visit OT Treatments $Self Care/Home Management : 8-22 mins  Antonio Ray, OT Pager [phone] 10/16/2017    Antonio Ray 10/16/2017, 1:53 PM

## 2017-10-16 NOTE — Progress Notes (Addendum)
PULMONARY / CRITICAL CARE MEDICINE   Name: Antonio Ray MRN: [id number] DOB: 06/02/1934    ADMISSION DATE:  10/13/2017 CONSULTATION DATE:  10/14/2017  REFERRING MD:  Tyrell Antonio  CHIEF COMPLAINT:  Pneumonia  HISTORY OF PRESENT ILLNESS:   84 y/o male well known to me for recurrent episodes of pneumonia and COPD was admitted for confusion, trouble breathing and pain in the right chest.  He was in his usual state of health until yesterday morning when he developed chills in the mornings associated with the above symptoms. The confusion worsened as the day went on and he developed worsening dyspnea.  He was brought to the ER and admitted to medicine again.  PCCM consulted.  SUBJECTIVE:  Feeling much better.  R sided pleuritic pain is resolved.  Sitting in chair, eating a cookie   VITAL SIGNS: BP (!) 111/55 (BP Location: Right Arm)   Pulse 60   Temp 98.1 F (36.7 C) (Oral)   Resp 18   Ht 5\' 8"  (1.727 m)   Wt 73.1 kg (161 lb 2.5 oz)   SpO2 93%   BMI 24.50 kg/m       INTAKE / OUTPUT: I/O last 3 completed shifts: In: 900 [P.O.:600; IV Piggyback:300] Out: 2250 [Urine:2250]  PHYSICAL EXAMINATION:  General:  Pleasant, NAD sitting in chair  HENT: NCAT OP clear PULM: resps even non labored on 2L [place], few scattered rhonchi R otherwise clear CV: RRR, no mgr GI: BS+, soft, nontender MSK: normal bulk and tone, no edema  Neuro: awake, alert, no distress, MAEW   LABS:  BMET Recent Labs  Lab 10/13/17 1401 10/14/17 0533 10/15/17 0512  NA 140 140 140  K 4.0 4.2 4.3  CL 104 104 107  CO2 25 23 25   BUN 41* 45* 43*  CREATININE 2.06* 1.94* 1.76*  GLUCOSE 89 186* 126*    Electrolytes Recent Labs  Lab 10/13/17 1401 10/14/17 0533 10/15/17 0512  CALCIUM 9.2 8.6* 8.3*    CBC Recent Labs  Lab 10/13/17 1358 10/14/17 0533 10/15/17 0512  WBC 25.1* 14.7* 12.2*  HGB 12.7* 11.9* 10.9*  HCT 41.0 38.9* 36.8*  PLT 250 237 217    Coag's Recent Labs  Lab 10/14/17 0533  INR  1.24    Sepsis Markers Recent Labs  Lab 10/13/17 1807 10/13/17 2302  LATICACIDVEN 0.9 0.9  PROCALCITON 4.46  --     ABG No results for input(s): PHART, PCO2ART, PO2ART in the last 168 hours.  Liver Enzymes Recent Labs  Lab 10/13/17 1401  AST 37  ALT 21  ALKPHOS 41  BILITOT 0.9  ALBUMIN 3.2*    Cardiac Enzymes Recent Labs  Lab 10/13/17 1807 10/13/17 2302 10/14/17 0533  TROPONINI 0.05* 0.04* 0.03*    Glucose Recent Labs  Lab 10/14/17 2140 10/15/17 0813 10/15/17 1250 10/15/17 1707 10/15/17 2139 10/16/17 0838  GLUCAP 138* 90 102* 126* 108* 166*    Imaging No results found.   STUDIES:  10/05/2017 CT chest> tree in bud abnormalities and airway thickening R lung, significant achalasia and food throughout the esophagus  CULTURES: 3/15 blood >  3/15 urine >   ANTIBIOTICS: 3/15 zosyn >   SIGNIFICANT EVENTS:   LINES/TUBES:   DISCUSSION: 84 y/o male with COPD, achalasia here with chest pain and dypnea on the right with more crackles in the right lung than normal.  This picture is consistent with yet another case of aspiratation tracheobronchitis vs pneumonia.  His achalasia is the cause for recurrent aspiration, not oropharyngeal dysphagia.  Improved overall.    ASSESSMENT / PLAN:  PULMONARY A: Acute tracheobronchitis vs aspiration pneumonia COPD, not in exacerbation Acute on chronic respiratory failure with hypoxemia - improving.  P:   Aspiration precautions: due to achalasia F/u CXR  Mobilize  Continue dulera Continue spiriva Continue as needed albuterol    GASTROINTESTINAL A:   Achalasia P:   Question for GI: dietary changes? Surgery?  PCCM singing off, please call back if needed. Can f/u as outpt.   Nickolas Madrid, NP 10/16/2017  11:39 AM Pager: [phone] or [phone]  Attending Note:  84 year old male with achalasia and chronic aspiration who presents with chest pain, tracheobronchitis vs pneumonia.  History of COPD  and hypoxemic respiratory failure.  On exam, crackles on the R>L.  I reviewed CXR myself, more opacification on the right than left consistent with aspiration.  Discussed with PCCM-NP.  Aspiration pneumonia:  - Aspiration precautions.  - Zosyn  Hypoxemia:  - Titrate O2 for sat of 88-92%  COPD:  - Dulera  - Spiriva  - PRN albuterol  Achalasia:  - ? GI intervention  - ? NPO and PEG placement  PCCM will sign off, please call back if needed.  Patient seen and examined, agree with above note.  I dictated the care and orders written for this patient under my direction.  Rush Farmer, [place]

## 2017-10-16 NOTE — Progress Notes (Signed)
Preliminary notes by tech--Bilateral lower extremities venous study completed. Negative for deep and superficial vein thrombosis. Result notified RN Claiborne Billings and  RN Ginger by phone.   Hongying Sufian Ravi (RDMS RVT)  10/16/17 5:12 PM

## 2017-10-16 NOTE — Telephone Encounter (Signed)
-----   Message from Alfredia Ferguson, PA-C sent at 10/15/2017  2:34 PM EDT ----- Regarding: office appt Margarette Asal, we [facility] this pt in [facility] this [facility]- in with recurrent aspiration pneumonia Has been having increased difficulty with dysphagia again - Feels food sitting in esophagus , and has altered diet to half portions because he senses esophagus filling up - he regurgitated while I was in room  After lunch- using  Wall suction He needs office appt with Dr Silverio Decamp first available -2 -3 weeks to discuss repeat Botox or  pneumatic dilation .  Do not put on with an APP - needs to talk with Dr Silverio Decamp regarding risks etc Please call pt's son [name] with appt etc Thanks

## 2017-10-16 NOTE — Progress Notes (Signed)
Physical Therapy Treatment [name] MRN: [id number] DOB: 1934/05/05 Today's Date: 10/16/2017    History of Present Illness Pt is an 84 y.o. male with extensive medical history including achalasia, chronic systolic dysfunction of L ventricle, CKD, COPD, coronary artery disease, dyspnea on exertion, dysrhythmia, esophageal dysmotility, GERD, heart murmur, hard of hearing, hyperlipidemia, memory loss, myocardial infarction, pneumonia, PVC's, renal failure, skin cancer, type II diabetes mellitus. Admitted with with increasing chest pain, worsening with inhalation and palpation..    PT Comments    Pt very motivated to participate in therapy. He demonstrated independence with bed mobility. Supervision provided for transfers and ambulation 250 feet with AD. No physical assist needed. No LOB noted. Desat to 78% with ambulation on RA. Pt placed on 2 L O2 with return to 90%. Pt reports he has home O2 but does not use it. Encouraged pt to use O2 during mobility.    Follow Up Recommendations  No PT follow up;Supervision - Intermittent     Equipment Recommendations  None recommended by PT    Recommendations for Other Services       Precautions / Restrictions Precautions Precautions: Other (comment) Precaution Comments: watch O2 stats Restrictions Weight Bearing Restrictions: No    Mobility  Bed Mobility Overal bed mobility: Modified Independent                Transfers   Equipment used: None Transfers: Sit to/from Stand Sit to Stand: Supervision         General transfer comment: supervision for safety  Ambulation/Gait Ambulation/Gait assistance: Supervision Ambulation Distance (Feet): 250 Feet Assistive device: None Gait Pattern/deviations: Step-through pattern Gait velocity: WFL Gait velocity interpretation: at or above normal speed for age/gender General Gait Details: desat to 78% on RA. Pt placed on 2 L O2 upon return to room with  improvement to 90% after 1 minute.   Stairs            Wheelchair Mobility    Modified Rankin (Stroke Patients Only)       Balance Overall balance assessment: Mild deficits observed, not formally tested                                          Cognition Arousal/Alertness: Awake/alert Behavior During Therapy: WFL for tasks assessed/performed Overall Cognitive Status: Within Functional Limits for tasks assessed                                 General Comments: mildly impulsive, but is baseline for pt      Exercises      General Comments General comments (skin integrity, edema, etc.): SpO2 96% at rest. Ambulated on RA with desat to 78%. Placed on 2 L O2 upon return to recliner with SpO2 increase to 90% after 1 minute. Pt reports he has home O2 but doesn't use it.       Pertinent Vitals/Pain Pain Assessment: No/denies pain    Home Living                      Prior Function            PT Goals (current goals can now be found in the care plan section) Acute Rehab PT Goals Patient Stated Goal: home asap PT Goal Formulation: With patient/family Time For  Goal Achievement: 10/28/17 Potential to Achieve Goals: Good Progress towards PT goals: Progressing toward goals    Frequency    Min 3X/week      PT Plan Current plan remains appropriate    Co-evaluation              AM-PAC PT "6 Clicks" Daily Activity  Outcome Measure  Difficulty turning over in bed (including adjusting bedclothes, sheets and blankets)?: None Difficulty moving from lying on back to sitting on the side of the bed? : None Difficulty sitting down on and standing up from a chair with arms (e.g., wheelchair, bedside commode, etc,.)?: None Help needed moving to and from a bed to chair (including a wheelchair)?: None Help needed walking in hospital room?: None Help needed climbing 3-5 steps with a railing? : A Little 6 Click Score: 23    End  of Session Equipment Utilized During Treatment: Gait belt Activity Tolerance: Patient tolerated treatment well Patient left: in chair;with call bell/phone within reach Nurse Communication: Mobility status PT Visit Diagnosis: Unsteadiness on feet (R26.81)     Time: 2130-8657 PT Time Calculation (min) (ACUTE ONLY): 14 min  Charges:  $Gait Training: 8-22 mins                    G Codes:       Lorrin Goodell, PT  Office # [phone] Pager [phone]    Lorriane Shire 10/16/2017, 10:36 AM

## 2017-10-16 NOTE — Consult Note (Signed)
   [facility] CM Inpatient Consult   10/16/2017  Antonio Ray [birth year] [id number]    Patient screened for potential [facility] Care Management program due to multiple hospitalizations.   Went to bedside to speak with [name] about [place] Management program.   He declines. States she does not need any follow up. Accepted [facility] Care Management brochure and contact information to call in future should he change his mind.  Will make inpatient RNCM aware [facility] services were declined.    Marthenia Rolling, MSN-Ed, RN,BSN [facility] Liaison [phone]

## 2017-10-16 NOTE — Discharge Summary (Addendum)
Physician Discharge Summary  Antonio Ray XBM:[id number] DOB: Sep 22, 1933 DOA: 10/13/2017  PCP: [name], MD  Admit date: 10/13/2017 Discharge date: 10/16/2017  Admitted From: Home  Disposition:  Home   Recommendations for Outpatient Follow-up:  1. Follow up with PCP in 1-2 weeks 2. Please obtain BMP/CBC in one week 3. Needs to follow up with GI for evaluation of Botox vs Pneumatic dilation.  4.  Home Health: none  Discharge Condition: stable.  CODE STATUS: Full code.  Diet recommendation: liquid, soft diet.   Brief/Interim Summary:  Antonio Geisen Cagleis a 84 y.o.malewithextensive medical history listed below, including recent hospitalization for influenza A with pneumonia, CAD, COPD, CKD stage III, and recent community-acquired pneumonia felt to be aspiration brought to the emergency department with increasing chest pain, worsening with inhalation and palpation. The chest pain mostly radiated to the right side, accompanied by some shortness of breath, but no cough. The patient was wheezing on presentation, requiring albuterol in route, and was placed on O2 nasal cannula, for improvement of his respiratory status. Of note, the patient does not wear home oxygen. He denies any fever or chills. He denies any abdominal pain, nausea or vomiting. Of note, the patient does have history of achalasia, and had several evaluations in the past by GI, including Botox injections per report. Last procedure was in 2017. He denies any diarrhea. He denies any recent medication changes except Lasix, which was used from 40-20 mg as an outpatient about 1 month ago. Patient denies any significant lower extremity swelling. No confusion is reported. He denies any vision changes, or headaches.   Assessment & Plan:   Principal Problem:   Pneumonia, aspiration ([place]) Active Problems:   Coronary artery disease   Sick sinus syndrome ([name])   Acute on chronic combined systolic and diastolic  congestive heart failure, NYHA class 3 ([name])   Hyperlipidemia   PVC's (premature ventricular contractions)   S/P ICD (internal cardiac defibrillator) procedure   BPH (benign prostatic hyperplasia)   GERD (gastroesophageal reflux disease)   COPD GOLD IV   Esophageal dysmotilities   Achalasia   Cardiomyopathy, ischemic   Chronic kidney disease, stage III (moderate) ([name])   Type 2 diabetes mellitus with stage 3 chronic kidney disease, with long-term current use of insulin ([name])   Mild cognitive impairment   HOH (hard of hearing)  1-Acute hypoxic respiratory failure; related to Aspiration PNA, tracheobronchitis.  Presents with chills, dyspnea, pleuritic chest pain.  Treated  with Zosyn while inpatient for 3 days. He will be discharge on Augmentin for 4 days. .  WBC trending down.  CCM consulted.  Chest x ray stable.   2-Achalasia;  GI consulted.  They  recommend follow up in the office in 2 weeks.  change PPI to BID>   Chest pain, Right side .pleuritic. Related to pleurisies.   related to PNA>  Doppler LE negative for DVT.  Mild elevated troponin in setting of renal failure.Marland Kitchen ECHO Similar as before, Ef 25 %. Discussed echo with cardiologist, focal hypokinesis similar as before. Diastolic dysfunction. Patient denies dyspnea, already on lasix.   Chronic Systolic  Diastolic HF;  Resume lasix.   HTN; resume lasix.   CKD; stage II. Cr baseline 1.6--1.5 Resume lasix today  Cr peak to 2-- trending down to 1.9. ---1.7  DM;  Resume low dose glargine. Hold meal coverage at discharge   History of Dementia  Resume Namenda   Benign prostatic hypertrophy, no acute issues On Proscar, hold for now. Due  to soft BP       Discharge Diagnoses:  Principal Problem:   Pneumonia, aspiration ([place]) Active Problems:   Coronary artery disease   Sick sinus syndrome ([name])   Acute on chronic combined systolic and diastolic congestive heart failure, NYHA class 3 ([name])    Hyperlipidemia   PVC's (premature ventricular contractions)   S/P ICD (internal cardiac defibrillator) procedure   BPH (benign prostatic hyperplasia)   GERD (gastroesophageal reflux disease)   COPD GOLD IV   Esophageal dysmotilities   Achalasia   Cardiomyopathy, ischemic   Chronic kidney disease, stage III (moderate) ([name])   Type 2 diabetes mellitus with stage 3 chronic kidney disease, with long-term current use of insulin ([name])   Mild cognitive impairment   HOH (hard of hearing)    Discharge Instructions  Discharge Instructions    Diet - low sodium heart healthy   Complete by:  As directed    Increase activity slowly   Complete by:  As directed      Allergies as of 10/16/2017      Reactions   Codeine Other (See Comments)   Gets very angry, disoriented   Dilaudid [hydromorphone Hcl] Other (See Comments)   VERY AGITATED, HOSTILE   Flomax [tamsulosin Hcl] Shortness Of Breath   Morphine And Related Other (See Comments)   VERY AGITATED, HOSTILE   Sulfa Antibiotics Shortness Of Breath   Beta Adrenergic Blockers Other (See Comments)   Disorientation   Carvedilol Other (See Comments)   DISORIENTATION      Medication List    STOP taking these medications   insulin aspart 100 UNIT/ML FlexPen Commonly known as:  NOVOLOG FLEXPEN     TAKE these medications   albuterol 108 (90 Base) MCG/ACT inhaler Commonly known as:  PROVENTIL HFA;VENTOLIN HFA Inhale 2 puffs into the lungs every 6 (six) hours as needed for wheezing or shortness of breath.   amoxicillin-clavulanate 875-125 MG tablet Commonly known as:  AUGMENTIN Take 1 tablet by mouth 2 (two) times daily for 4 days.   aspirin 81 MG EC tablet Take 81 mg by mouth daily.   BASAGLAR KWIKPEN 100 UNIT/ML Sopn INJECT 5 UNITS UNDER THE SKIN EVERY MORNING What changed:  additional instructions   donepezil 10 MG tablet Commonly known as:  ARICEPT Take 1 tablet (10 mg total) by mouth every morning.   fenofibrate 145 MG  tablet Commonly known as:  TRICOR TAKE 1 TABLET BY MOUTH EVERY DAY What changed:    how much to take  how to take this  when to take this   finasteride 5 MG tablet Commonly known as:  PROSCAR Take 5 mg by mouth daily.   furosemide 20 MG tablet Commonly known as:  LASIX Take 1 tablet (20 mg total) by mouth daily.   loratadine 10 MG tablet Commonly known as:  CLARITIN Take 1 tablet (10 mg total) by mouth daily.   memantine 10 MG tablet Commonly known as:  NAMENDA Take 1 tablet (10 mg total) by mouth 2 (two) times daily.   nitroGLYCERIN 0.2 mg/hr patch Commonly known as:  NITRODUR - Dosed in mg/24 hr Place 1 patch (0.2 mg total) onto the skin daily. Alternates patch placement with odd/even days.   pantoprazole 40 MG tablet Commonly known as:  PROTONIX Take 1 tablet (40 mg total) by mouth 2 (two) times daily. What changed:    medication strength  how much to take  when to take this   pravastatin 40 MG tablet Commonly known  as:  PRAVACHOL Take 1 tablet (40 mg total) by mouth at bedtime.   PRESERVISION AREDS 2 Caps Take 1 capsule by mouth 2 (two) times daily.   SPIRIVA HANDIHALER 18 MCG inhalation capsule Generic drug:  tiotropium INHALE CONTENTS OF 1 CAPSULE VIA HANDIHALER ONCE DAILY   SYMBICORT 160-4.5 MCG/ACT inhaler Generic drug:  budesonide-formoterol INHALE 2 PUFFS BY MOUTH TWICE DAILY What changed:    how much to take  how to take this  when to take this   Vitamin D 2000 units tablet Take 2,000 Units by mouth daily.       Allergies  Allergen Reactions  . Codeine Other (See Comments)    Gets very angry, disoriented  . Dilaudid [Hydromorphone Hcl] Other (See Comments)    VERY AGITATED, HOSTILE  . Flomax [Tamsulosin Hcl] Shortness Of Breath  . Morphine And Related Other (See Comments)    VERY AGITATED, HOSTILE  . Sulfa Antibiotics Shortness Of Breath  . Beta Adrenergic Blockers Other (See Comments)    Disorientation  . Carvedilol Other  (See Comments)    DISORIENTATION    Consultations:  Gi  Pulmonologist    Procedures/Studies: Dg Chest 2 View  Result Date: 10/14/2017 CLINICAL DATA:  84 year old male with right side chest pain. Shortness of breath. EXAM: CHEST - 2 VIEW COMPARISON:  10/13/2017 and earlier. FINDINGS: Upright AP and lateral views of the chest. Stable large lung volumes with increased AP dimension to the chest. No pneumothorax, pulmonary edema, pleural effusion or confluent pulmonary opacity. Stable cardiac size and mediastinal contours. Stable left chest AICD. Calcified aortic atherosclerosis. No acute osseous abnormality identified. Paucity of bowel gas in the upper abdomen. IMPRESSION: Stable pulmonary hyperinflation. No acute cardiopulmonary abnormality. Electronically Signed   By: Genevie Ann M.D.   On: 10/14/2017 19:58   Dg Chest 2 View  Result Date: 10/13/2017 CLINICAL DATA:  Weakness, altered mental status, shortness of breath and chest pain. EXAM: CHEST - 2 VIEW COMPARISON:  09/27/2017 FINDINGS: Heart size is normal. Pacemaker/AICD remains in place. Aortic atherosclerosis as seen previously. Pulmonary vascularity within normal limits. Lungs are hyperinflated in there are chronically increased markings. No evidence of active infiltrate, mass, effusion or collapse. IMPRESSION: Stable radiographic pattern. Chronically increased interstitial markings. No edema, consolidation or collapse. Electronically Signed   By: Nelson Chimes M.D.   On: 10/13/2017 14:49   Dg Chest 2 View  Result Date: 09/27/2017 CLINICAL DATA:  Follow-up pneumonia. EXAM: CHEST  2 VIEW COMPARISON:  08/19/2017 FINDINGS: There is a left chest wall pacer device with lead in the right atrial appendage and right ventricle. The heart size appears normal. There is no pleural effusion or airspace consolidation. Persistent nodular density overlying the right lower lobe is identified measuring 1.4 cm. Indeterminate. IMPRESSION: 1. Persistent 1.4 cm right  lower lobe lung nodule which is indeterminate. Consider further evaluation with noncontrast CT of the chest. Electronically Signed   By: Kerby Moors M.D.   On: 09/27/2017 16:26   Ct Chest Wo Contrast  Result Date: 10/05/2017 CLINICAL DATA:  Right lower lobe nodule on chest radiograph, recent pneumonia, cough, shortness of breath EXAM: CT CHEST WITHOUT CONTRAST TECHNIQUE: Multidetector CT imaging of the chest was performed following the standard protocol without IV contrast. COMPARISON:  Chest radiographs dated 09/27/2016 FINDINGS: Cardiovascular: The heart is normal in size. No pericardial effusion. No evidence of thoracic aortic aneurysm. Atherosclerotic calcifications of the aortic arch. Three vessel coronary atherosclerosis. Left subclavian ICD. Mediastinum/Nodes: No suspicious mediastinal lymphadenopathy. Dilated, fluid-filled esophagus,  suggesting esophageal dysmotility. Visualized thyroid is unremarkable. Lungs/Pleura: Biapical pleural-parenchymal scarring. Tree-in-bud nodularity throughout the right lung, including dominant nodular scarring inferiorly in the right middle lobe (series 3/image 106) and prominent nodules in the right upper lobe measuring up to 9 mm (series 3/image 59) and also 9 mm at the lateral right lung base (series 3/image 118). Additional mild tree-in-bud nodularity the posterior left lower lobe (series 3/image 128). Overall, this appearance favors an airways mediated infection (infectious bronchiolitis), with chronic atypical mycobacterial infection remaining possible. Technically speaking, in the appropriate clinical setting, TB could have this appearance. No focal consolidation. No pleural effusion or pneumothorax. Upper Abdomen: Visualized upper abdomen is notable for prior cholecystectomy. Musculoskeletal: Mild degenerative changes of the visualized thoracolumbar spine. IMPRESSION: Multifocal tree-in-bud nodularity, right lung predominant, suggesting infectious bronchiolitis.  Chronic atypical mycobacterial infection remains possible. Technically speaking, in the appropriate clinical setting, TB could have this appearance. These results will be called to the ordering clinician or representative by the Radiologist Assistant, and communication documented in the PACS or zVision Dashboard. Aortic Atherosclerosis (ICD10-I70.0). Electronically Signed   By: Julian Hy M.D.   On: 10/05/2017 11:04      Subjective: He is feeling better, breathing better   Discharge Exam: Vitals:   10/16/17 0515 10/16/17 0804  BP: (!) 111/55   Pulse: 60   Resp: 18   Temp: 98.1 F (36.7 C)   SpO2: 95% 93%   Vitals:   10/15/17 1947 10/15/17 2134 10/16/17 0515 10/16/17 0804  BP:  (!) 99/54 (!) 111/55   Pulse:  66 60   Resp:  12 18   Temp:  98.1 F (36.7 C) 98.1 F (36.7 C)   TempSrc:  Oral Oral   SpO2: 97% 95% 95% 93%  Weight:   73.1 kg (161 lb 2.5 oz)   Height:        General: Pt is alert, awake, not in acute distress Cardiovascular: RRR, S1/S2 +, no rubs, no gallops Respiratory: CTA bilaterally, no wheezing, no rhonchi Abdominal: Soft, NT, ND, bowel sounds + Extremities: no edema, no cyanosis    The results of significant diagnostics from this hospitalization (including imaging, microbiology, ancillary and laboratory) are listed below for reference.     Microbiology: Recent Results (from the past 240 hour(s))  Culture, blood (Routine X 2) w Reflex to ID Panel     Status: None (Preliminary result)   Collection Time: 10/13/17  4:55 PM  Result Value Ref Range Status   Specimen Description BLOOD LEFT FOREARM  Final   Special Requests   Final    BOTTLES DRAWN AEROBIC AND ANAEROBIC Blood Culture adequate volume   Culture   Final    NO GROWTH 2 DAYS Performed at [facility], [address] [place], [place] [address]    Report Status PENDING  Incomplete  Culture, blood (Routine X 2) w Reflex to ID Panel     Status: None (Preliminary result)   Collection  Time: 10/13/17  6:30 PM  Result Value Ref Range Status   Specimen Description BLOOD LEFT FOREARM  Final   Special Requests IN PEDIATRIC BOTTLE Blood Culture adequate volume  Final   Culture   Final    NO GROWTH 2 DAYS Performed at [facility], [place] [address], [place], [place] [address]    Report Status PENDING  Incomplete  Culture, Urine     Status: Abnormal   Collection Time: 10/13/17 11:46 PM  Result Value Ref Range Status  Specimen Description URINE, CLEAN CATCH  Final   Special Requests NONE  Final   Culture (A)  Final    <10,000 COLONIES/mL INSIGNIFICANT GROWTH Performed at [facility], [place] [address], [place], [place] [address]    Report Status 10/15/2017 FINAL  Final     Labs: BNP (last 3 results) Recent Labs    02/02/17 0820 10/13/17 1401  BNP 108.5* 831.5*   Basic Metabolic Panel: Recent Labs  Lab 10/13/17 1401 10/14/17 0533 10/15/17 0512  NA 140 140 140  K 4.0 4.2 4.3  CL 104 104 107  CO2 25 23 25   GLUCOSE 89 186* 126*  BUN 41* 45* 43*  CREATININE 2.06* 1.94* 1.76*  CALCIUM 9.2 8.6* 8.3*   Liver Function Tests: Recent Labs  Lab 10/13/17 1401  AST 37  ALT 21  ALKPHOS 41  BILITOT 0.9  PROT 6.7  ALBUMIN 3.2*   No results for input(s): LIPASE, AMYLASE in the last 168 hours. No results for input(s): AMMONIA in the last 168 hours. CBC: Recent Labs  Lab 10/13/17 1358 10/14/17 0533 10/15/17 0512  WBC 25.1* 14.7* 12.2*  HGB 12.7* 11.9* 10.9*  HCT 41.0 38.9* 36.8*  MCV 87.8 88.8 88.9  PLT 250 237 217   Cardiac Enzymes: Recent Labs  Lab 10/13/17 1807 10/13/17 2302 10/14/17 0533  TROPONINI 0.05* 0.04* 0.03*   BNP: Invalid input(s): POCBNP CBG: Recent Labs  Lab 10/15/17 1250 10/15/17 1707 10/15/17 2139 10/16/17 0838 10/16/17 1203  GLUCAP 102* 126* 108* 166* 86   D-Dimer No results for input(s): DDIMER in the last 72 hours. Hgb A1c Recent Labs    10/13/17 1807  HGBA1C 6.9*   Lipid Profile No results for  input(s): CHOL, HDL, LDLCALC, TRIG, CHOLHDL, LDLDIRECT in the last 72 hours. Thyroid function studies No results for input(s): TSH, T4TOTAL, T3FREE, THYROIDAB in the last 72 hours.  Invalid input(s): FREET3 Anemia work up No results for input(s): VITAMINB12, FOLATE, FERRITIN, TIBC, IRON, RETICCTPCT in the last 72 hours. Urinalysis    Component Value Date/Time   COLORURINE YELLOW 10/13/2017 2346   APPEARANCEUR CLEAR 10/13/2017 2346   LABSPEC 1.019 10/13/2017 2346   PHURINE 5.0 10/13/2017 2346   GLUCOSEU NEGATIVE 10/13/2017 2346   HGBUR NEGATIVE 10/13/2017 2346   BILIRUBINUR NEGATIVE 10/13/2017 2346   BILIRUBINUR neg 06/06/2012 1037   KETONESUR NEGATIVE 10/13/2017 2346   PROTEINUR NEGATIVE 10/13/2017 2346   UROBILINOGEN 0.2 05/12/2015 0920   NITRITE NEGATIVE 10/13/2017 2346   LEUKOCYTESUR NEGATIVE 10/13/2017 2346   Sepsis Labs Invalid input(s): PROCALCITONIN,  WBC,  LACTICIDVEN Microbiology Recent Results (from the past 240 hour(s))  Culture, blood (Routine X 2) w Reflex to ID Panel     Status: None (Preliminary result)   Collection Time: 10/13/17  4:55 PM  Result Value Ref Range Status   Specimen Description BLOOD LEFT FOREARM  Final   Special Requests   Final    BOTTLES DRAWN AEROBIC AND ANAEROBIC Blood Culture adequate volume   Culture   Final    NO GROWTH 2 DAYS Performed at [facility], [place] [address] [place], [place] [address]    Report Status PENDING  Incomplete  Culture, blood (Routine X 2) w Reflex to ID Panel     Status: None (Preliminary result)   Collection Time: 10/13/17  6:30 PM  Result Value Ref Range Status   Specimen Description BLOOD LEFT FOREARM  Final   Special Requests IN PEDIATRIC BOTTLE Blood Culture adequate volume  Final  Culture   Final    NO GROWTH 2 DAYS Performed at [facility], [place] [address], [place], [place] [address]    Report Status PENDING  Incomplete  Culture, Urine     Status: Abnormal   Collection Time: 10/13/17  11:46 PM  Result Value Ref Range Status   Specimen Description URINE, CLEAN CATCH  Final   Special Requests NONE  Final   Culture (A)  Final    <10,000 COLONIES/mL INSIGNIFICANT GROWTH Performed at [facility], [place] [address] [place], [place] [address]    Report Status 10/15/2017 FINAL  Final     Time coordinating discharge: Over 30 minutes  SIGNED:   Elmarie Shiley, MD  [facility] 10/16/2017, 1:30 PM Pager   If 7PM-7AM, please contact night-coverage [email or web address] Password [email or web address]

## 2017-10-18 LAB — CULTURE, BLOOD (ROUTINE X 2)
CULTURE: NO GROWTH
Culture: NO GROWTH
Special Requests: ADEQUATE
Special Requests: ADEQUATE

## 2017-10-19 NOTE — Telephone Encounter (Signed)
There was a cancellation on the schedule for 10/23/17 at 2:30 pm. Next available appointment is over a month away. Doristine Bosworth contacted. He is able to bring the patient to the 10/23/17 appointment. I have scheduled him for this.

## 2017-10-23 ENCOUNTER — Encounter: Payer: Self-pay | Admitting: Gastroenterology

## 2017-10-23 ENCOUNTER — Ambulatory Visit (INDEPENDENT_AMBULATORY_CARE_PROVIDER_SITE_OTHER): Payer: Medicare Other | Admitting: Gastroenterology

## 2017-10-23 VITALS — BP 98/58 | HR 76 | Ht 67.0 in | Wt 161.1 lb

## 2017-10-23 DIAGNOSIS — I255 Ischemic cardiomyopathy: Secondary | ICD-10-CM

## 2017-10-23 DIAGNOSIS — R1310 Dysphagia, unspecified: Secondary | ICD-10-CM

## 2017-10-23 DIAGNOSIS — K220 Achalasia of cardia: Secondary | ICD-10-CM | POA: Diagnosis not present

## 2017-10-23 MED ORDER — PANTOPRAZOLE SODIUM 40 MG PO TBEC: 40.0000 mg | DELAYED_RELEASE_TABLET | Freq: Every day | ORAL | 3 refills | Status: DC

## 2017-10-23 NOTE — Patient Instructions (Signed)
If you are age 82 or older, your body mass index should be between 23-30. Your Body mass index is 25.24 kg/m. If this is out of the aforementioned range listed, please consider follow up with your Primary Care Provider.  If you are age 27 or younger, your body mass index should be between 19-25. Your Body mass index is 25.24 kg/m. If this is out of the aformentioned range listed, please consider follow up with your Primary Care Provider.   We have sent the following medications to your pharmacy for you to pick up at your convenience: Protonix 40mg  daily.   You have been scheduled for a Barium Esophogram at [facility] (1st floor of the hospital) on 11/06/17 at 9:15am. Please arrive 15 minutes prior to your appointment for registration. Make certain not to have anything to eat or drink 6 hours prior to your test. If you need to reschedule for any reason, please contact radiology at [phone] to do so. __________________________________________________________________ A barium swallow is an examination that concentrates on views of the esophagus. This tends to be a double contrast exam (barium and two liquids which, when combined, create a gas to distend the wall of the oesophagus) or single contrast (non-ionic iodine based). The study is usually tailored to your symptoms so a good history is essential. Attention is paid during the study to the form, structure and configuration of the esophagus, looking for functional disorders (such as aspiration, dysphagia, achalasia, motility and reflux) EXAMINATION You may be asked to change into a gown, depending on the type of swallow being performed. A radiologist and radiographer will perform the procedure. The radiologist will advise you of the type of contrast selected for your procedure and direct you during the exam. You will be asked to stand, sit or lie in several different positions and to hold a small amount of fluid in your mouth before being  asked to swallow while the imaging is performed .In some instances you may be asked to swallow barium coated marshmallows to assess the motility of a solid food bolus. The exam can be recorded as a digital or video fluoroscopy procedure. POST PROCEDURE It will take 1-2 days for the barium to pass through your system. To facilitate this, it is important, unless otherwise directed, to increase your fluids for the next 24-48hrs and to resume your normal diet.  This test typically takes about 30 minutes to perform. __________________________________________________________________________________

## 2017-10-23 NOTE — Progress Notes (Signed)
Antonio Ray    [id number]    Dec 12, 1933  Primary Care Physician:Briscoe, Jannifer Rodney, MD  Referring Physician: [name], MD [place], [place] [address]  Chief complaint:  Dysphagia, achalasia  HPI:  72 yr M with h/o achalasia s/p pneumatic dilation Feb 2017 here for follow up visit. He had 3 recent hospitalizations with Flu followed by pneumonia and COPD exacerbation. There was a concern for possible aspiration pneumonia but according to patient and his son, his swallowing is stable at baseline since the pneumatic dilation, is able to eat without any major difficulty. He has not been regurgitating food or liquids like he was previously. Barium esophagogram from last year April 2018 showed flow through EG junction, he does have significant dilation along with tortuosity of the esophagus. His esophagus was fluid filled on recent CT and also has b/l tree in bud appearance lung nodules.  He is accompanied by his son for the visit who provided most of the history. Patient denied any complaints and is feeling much better.    Outpatient Encounter Medications as of 10/23/2017  Medication Sig  . albuterol (PROVENTIL HFA;VENTOLIN HFA) 108 (90 Base) MCG/ACT inhaler Inhale 2 puffs into the lungs every 6 (six) hours as needed for wheezing or shortness of breath.  Marland Kitchen aspirin 81 MG EC tablet Take 81 mg by mouth daily.    . Cholecalciferol (VITAMIN D) 2000 units tablet Take 2,000 Units by mouth daily.   Marland Kitchen donepezil (ARICEPT) 10 MG tablet Take 1 tablet (10 mg total) by mouth every morning.  . fenofibrate (TRICOR) 145 MG tablet TAKE 1 TABLET BY MOUTH EVERY DAY (Patient taking differently: Take 145 mg by mouth once a day)  . finasteride (PROSCAR) 5 MG tablet Take 5 mg by mouth daily.  . furosemide (LASIX) 20 MG tablet Take 1 tablet (20 mg total) by mouth daily.  . Insulin Glargine (BASAGLAR KWIKPEN) 100 UNIT/ML SOPN INJECT 5 UNITS UNDER THE SKIN EVERY MORNING  .  loratadine (CLARITIN) 10 MG tablet Take 1 tablet (10 mg total) by mouth daily.  . memantine (NAMENDA) 10 MG tablet Take 1 tablet (10 mg total) by mouth 2 (two) times daily.  . Multiple Vitamins-Minerals (PRESERVISION AREDS 2) CAPS Take 1 capsule by mouth 2 (two) times daily.  . nitroGLYCERIN (NITRODUR - DOSED IN MG/24 HR) 0.2 mg/hr patch Place 1 patch (0.2 mg total) onto the skin daily. Alternates patch placement with odd/even days.  . pantoprazole (PROTONIX) 40 MG tablet Take 1 tablet (40 mg total) by mouth 2 (two) times daily.  . pravastatin (PRAVACHOL) 40 MG tablet Take 1 tablet (40 mg total) by mouth at bedtime.  Marland Kitchen SPIRIVA HANDIHALER 18 MCG inhalation capsule INHALE CONTENTS OF 1 CAPSULE VIA HANDIHALER ONCE DAILY  . SYMBICORT 160-4.5 MCG/ACT inhaler INHALE 2 PUFFS BY MOUTH TWICE DAILY (Patient taking differently: Inhale 2 puffs into the lungs two times a day)  . pantoprazole (PROTONIX) 40 MG tablet Take 1 tablet (40 mg total) by mouth daily.   No facility-administered encounter medications on file as of 10/23/2017.     Allergies as of 10/23/2017 - Review Complete 10/23/2017  Allergen Reaction Noted  . Codeine Other (See Comments)   . Dilaudid [hydromorphone hcl] Other (See Comments) 09/10/2013  . Flomax [tamsulosin hcl] Shortness Of Breath 05/19/2012  . Morphine and related Other (See Comments) 05/20/2011  . Sulfa antibiotics Shortness Of Breath 05/23/2012  . Beta adrenergic  blockers Other (See Comments) 03/31/2015  . Carvedilol Other (See Comments) 04/18/2011    Past Medical History:  Diagnosis Date  . Achalasia   . AICD (automatic cardioverter/defibrillator) present 03/30/2011   Analyze ST study patient  . Anginal pain ([place])   . BPH (benign prostatic hyperplasia)   . CHF (congestive heart failure) ([place])   . Chronic systolic dysfunction of left ventricle    EF 30-35%, CLASS II - III SYMPTOMS; intolerant to Coreg  . CKD (chronic kidney disease) stage 3, GFR 30-59 ml/min ([name])  06/08/2012  . COPD (chronic obstructive pulmonary disease) ([place])   . Coronary artery disease    History of remote anterior MI with PCI to LAD in 2006; most recent cath 2007, no intervention required  . DOE (dyspnea on exertion)    with heavy exertion  . Dysrhythmia   . Esophageal dysmotility   . GERD (gastroesophageal reflux disease)   . GERD (gastroesophageal reflux disease)   . Head injury, closed, with concussion 2000ish  . Heart murmur    hx  . HOH (hard of hearing)   . Hyperlipidemia   . Memory loss    improved  . Myocardial infarction ([place]) 1994   ANTERIOR  . Pneumonia "several times"   aspiration pna with at least 3 admits for this 2016.   Marland Kitchen PVC's (premature ventricular contractions)   . Renal failure   . Skin cancer "several"   "forearms; head"  . Type II diabetes mellitus ([place])     Past Surgical History:  Procedure Laterality Date  . BALLOON DILATION N/A 09/09/2015   Procedure: BALLOON DILATION;  Surgeon: Mauri Pole, MD;  Location: [facility];  Service: Endoscopy;  Laterality: N/A;  rigiflex achalasia balloon dilators  . BOTOX INJECTION N/A 04/08/2015   Procedure: BOTOX INJECTION;  Surgeon: Milus Banister, MD;  Location: [facility];  Service: Endoscopy;  Laterality: N/A;  . CARDIAC CATHETERIZATION  01/11/2006   DEMONSTRATES AKINESIA OF THE DISTAL ANTERIOR WALL, DISTAL INFERIOR WALL AND AKINESIA OF THE APEX. THE BASAL SEGMENTS CONTRACT WELL AND OVERALL EF 35%  . CATARACT EXTRACTION W/ INTRAOCULAR LENS  IMPLANT, BILATERAL  08/2014-09/2014  . CHOLECYSTECTOMY  2/11  . CORONARY ANGIOPLASTY  1994   TO THE LAD  . ESOPHAGEAL MANOMETRY N/A 03/16/2015   Procedure: ESOPHAGEAL MANOMETRY (EM);  Surgeon: Jerene Bears, MD;  Location: [facility];  Service: Gastroenterology;  Laterality: N/A;  . ESOPHAGOGASTRODUODENOSCOPY N/A 04/08/2015   Procedure: ESOPHAGOGASTRODUODENOSCOPY (EGD);  Surgeon: Milus Banister, MD;  Location: [facility];  Service: Endoscopy;  Laterality: N/A;    . ESOPHAGOGASTRODUODENOSCOPY (EGD) WITH PROPOFOL N/A 09/09/2015   Procedure: ESOPHAGOGASTRODUODENOSCOPY (EGD) WITH PROPOFOL;  Surgeon: Mauri Pole, MD;  Location: [facility];  Service: Endoscopy;  Laterality: N/A;  pt. is to have gastrografin xray post recovery-do not discharge until results are back  . EYE SURGERY    . FOOT FRACTURE SURGERY Right 1980's  . INSERT / REPLACE / REMOVE PACEMAKER    . SKIN CANCER EXCISION  "several"   "forearms, head"    Family History  Problem Relation Age of Onset  . Stroke Father        Mother history unknown - never met her  . Colon cancer Neg Hx     Social History   Socioeconomic History  . Marital status: Widowed    Spouse name: Not on file  . Number of children: 2  . Years of education: GED  . Highest education level: Not on file  Occupational  History  . Occupation: Clinical biochemist: [employer]    Comment: Retired  . Occupation: [employer]    Employer: [employer]    Comment: Retired  Scientific laboratory technician  . Financial resource strain: Patient refused  . Food insecurity:    Worry: Never true    Inability: Never true  . Transportation needs:    Medical: No    Non-medical: No  Tobacco Use  . Smoking status: Former Smoker    Packs/day: 1.00    Years: 35.00    Pack years: 35.00    Types: Cigarettes    Last attempt to quit: 08/01/1992    Years since quitting: 25.2  . Smokeless tobacco: Former Systems developer    Quit date: 1994  Substance and Sexual Activity  . Alcohol use: No    Alcohol/week: 0.0 oz  . Drug use: No  . Sexual activity: Not Currently  Lifestyle  . Physical activity:    Days per week: Patient refused    Minutes per session: Patient refused  . Stress: Patient refused  Relationships  . Social connections:    Talks on phone: Not on file    Gets together: Not on file    Attends religious service: Not on file    Active member of club or organization: Not on file    Attends meetings of clubs or  organizations: Not on file    Relationship status: Not on file  . Intimate partner violence:    Fear of current or ex partner: Not on file    Emotionally abused: Not on file    Physically abused: Not on file    Forced sexual activity: Not on file  Other Topics Concern  . Not on file  Social History Narrative   Pt lives in [place] alone.  Widowed.   Right-handed.   Rare caffeine use - maybe one cup per month.      Review of systems: Review of Systems  Constitutional: Negative for fever and chills.  HENT: Negative.   Eyes: Negative for blurred vision.  Respiratory: Positive for  shortness of breath and wheezing.   Cardiovascular: Negative for chest pain and palpitations.  Gastrointestinal: as per HPI Genitourinary: Negative for dysuria, urgency, frequency and hematuria.  Musculoskeletal: Negative for myalgias, back pain and joint pain.  Skin: Negative for itching and rash.  Neurological: Negative for dizziness, tremors, focal weakness, seizures and loss of consciousness.  Endo/Heme/Allergies: Positive for seasonal allergies.  Psychiatric/Behavioral: Negative for depression, suicidal ideas and hallucinations.  All other systems reviewed and are negative.   Physical Exam: Vitals:   10/23/17 1438  BP: (!) 98/58  Pulse: 76   Body mass index is 25.24 kg/m. Gen:      No acute distress HEENT:  EOMI, sclera anicteric Neck:     No masses; no thyromegaly Lungs:    Clear to auscultation bilaterally; normal respiratory effort CV:         Regular rate and rhythm; no murmurs Abd:      + bowel sounds; soft, non-tender; no palpable masses, no distension Ext:    1+ edema; adequate peripheral perfusion Skin:      Warm and dry; no rash Neuro: alert and oriented x 3 Psych: normal mood and affect  Data Reviewed:  Reviewed labs, radiology imaging, old records and pertinent past GI work up   Assessment and Plan/Recommendations:  39 yr M with severe COPD s/p recent influenza and  hospitalizations with pneumonia.  Achalasia s/p  pneumatic dilation with 30 mm rigiflex balloon Feb 2017 , even though distal EGJ outflow obstruction was relieved with pneumatic dilation and has good flow through EG junction, he doesn't completely clear his esophagus secondary to aperistalsis and significantly dilated tortuous esophagus due to chronicity of his disease.  Will repeat Barium esophagogram.  Repeat botox injection or pneumatic dilation of EG junction will have minimum benefit if has no evidence of EGJ outflow obstruction on Barium esophagogram and he is at risk for potential anesthesia and procedure related complications. He is not a candidate for any surgical intervention or esophagectomy Discussed dietary and lifestyle adjustments in detail to minimize aspiration   K. Denzil Magnuson , MD [phone] Mon-Fri 8a-5p [phone] after 5p, weekends, holidays  CC: [name], MD

## 2017-11-01 DIAGNOSIS — N400 Enlarged prostate without lower urinary tract symptoms: Secondary | ICD-10-CM | POA: Diagnosis not present

## 2017-11-01 DIAGNOSIS — E1142 Type 2 diabetes mellitus with diabetic polyneuropathy: Secondary | ICD-10-CM | POA: Diagnosis not present

## 2017-11-01 DIAGNOSIS — I495 Sick sinus syndrome: Secondary | ICD-10-CM | POA: Diagnosis not present

## 2017-11-01 DIAGNOSIS — J449 Chronic obstructive pulmonary disease, unspecified: Secondary | ICD-10-CM | POA: Diagnosis not present

## 2017-11-01 DIAGNOSIS — I5022 Chronic systolic (congestive) heart failure: Secondary | ICD-10-CM | POA: Diagnosis not present

## 2017-11-01 DIAGNOSIS — R1310 Dysphagia, unspecified: Secondary | ICD-10-CM | POA: Diagnosis not present

## 2017-11-01 DIAGNOSIS — I10 Essential (primary) hypertension: Secondary | ICD-10-CM | POA: Diagnosis not present

## 2017-11-01 DIAGNOSIS — N183 Chronic kidney disease, stage 3 (moderate): Secondary | ICD-10-CM | POA: Diagnosis not present

## 2017-11-01 DIAGNOSIS — E785 Hyperlipidemia, unspecified: Secondary | ICD-10-CM | POA: Diagnosis not present

## 2017-11-06 ENCOUNTER — Ambulatory Visit (HOSPITAL_COMMUNITY)
Admission: RE | Admit: 2017-11-06 | Discharge: 2017-11-06 | Disposition: A | Discharge status: Home / Self Care | Payer: Medicare Other | Source: Ambulatory Visit | Attending: Gastroenterology | Admitting: Gastroenterology

## 2017-11-06 DIAGNOSIS — R1310 Dysphagia, unspecified: Secondary | ICD-10-CM | POA: Diagnosis not present

## 2017-11-06 DIAGNOSIS — E1151 Type 2 diabetes mellitus with diabetic peripheral angiopathy without gangrene: Secondary | ICD-10-CM | POA: Diagnosis not present

## 2017-11-06 DIAGNOSIS — L84 Corns and callosities: Secondary | ICD-10-CM | POA: Diagnosis not present

## 2017-11-06 DIAGNOSIS — K220 Achalasia of cardia: Secondary | ICD-10-CM

## 2017-11-06 DIAGNOSIS — L603 Nail dystrophy: Secondary | ICD-10-CM | POA: Diagnosis not present

## 2017-11-06 DIAGNOSIS — I739 Peripheral vascular disease, unspecified: Secondary | ICD-10-CM | POA: Diagnosis not present

## 2017-11-16 ENCOUNTER — Encounter: Payer: Self-pay | Admitting: Pulmonary Disease

## 2017-11-16 ENCOUNTER — Ambulatory Visit (INDEPENDENT_AMBULATORY_CARE_PROVIDER_SITE_OTHER): Payer: Medicare Other | Admitting: Pulmonary Disease

## 2017-11-16 VITALS — BP 108/62 | HR 67

## 2017-11-16 DIAGNOSIS — K220 Achalasia of cardia: Secondary | ICD-10-CM | POA: Diagnosis not present

## 2017-11-16 DIAGNOSIS — J181 Lobar pneumonia, unspecified organism: Secondary | ICD-10-CM | POA: Diagnosis not present

## 2017-11-16 DIAGNOSIS — J449 Chronic obstructive pulmonary disease, unspecified: Secondary | ICD-10-CM

## 2017-11-16 DIAGNOSIS — J441 Chronic obstructive pulmonary disease with (acute) exacerbation: Secondary | ICD-10-CM

## 2017-11-16 DIAGNOSIS — I255 Ischemic cardiomyopathy: Secondary | ICD-10-CM

## 2017-11-16 DIAGNOSIS — J189 Pneumonia, unspecified organism: Secondary | ICD-10-CM

## 2017-11-16 MED ORDER — ALBUTEROL SULFATE HFA 108 (90 BASE) MCG/ACT IN AERS: 2.0000 | INHALATION_SPRAY | Freq: Four times a day (QID) | RESPIRATORY_TRACT | 2 refills | Status: AC | PRN

## 2017-11-16 MED ORDER — ALBUTEROL SULFATE HFA 108 (90 BASE) MCG/ACT IN AERS: 2.0000 | INHALATION_SPRAY | Freq: Four times a day (QID) | RESPIRATORY_TRACT | 2 refills | Status: DC | PRN

## 2017-11-16 NOTE — Patient Instructions (Signed)
Severe COPD with recurrent exacerbations: Continue Symbicort Continue Spiriva We will refill albuterol today  Esophageal narrowing/stenosis: I will talk to Dr. Silverio Decamp about whether or not we should support 2 through an esophageal dilation procedure.  Follow-up 3 months or sooner if needed

## 2017-11-16 NOTE — Progress Notes (Signed)
Subjective:    Patient ID: Antonio Ray, male    DOB: 11-18-1933, 84 y.o.   MRN: [id number]  Synopsis: First the [facility] pulmonary clinic in 2015 for severe COPD. His FEV1 at that time was 29% predicted. He also has a scar in his right lung base believed to be related to pneumonia. He also has a history of achalasia and was admitted for healthcare associated pneumonia multiple times in 2016. He underwent Botox injections in his distal esophagus in late 2016.  HPI  Chief Complaint  Patient presents with  . Follow-up    Was in the hospital 3/15, chest pain is gone from hospital visit,   Antonio Ray presents today for follow-up after yet another hospitalization for an acute respiratory problem.  Over the years he has had full-blown aspiration pneumonia, COPD exacerbations and tracheobronchitis.  His symptoms typically involve either confusion or shortness of breath or chest pain and usually are focused in the right chest.  This happened most recently in March and he was hospitalized then.  Since coming home he says the chest pain is improved he has minimal shortness of breath and minimal cough.  He is compliant with his Symbicort and Spiriva.  He is not using albuterol much right now.  He is trying to stay active again.  Past Medical History:  Diagnosis Date  . Achalasia   . AICD (automatic cardioverter/defibrillator) present 03/30/2011   Analyze ST study patient  . Anginal pain ([place])   . BPH (benign prostatic hyperplasia)   . CHF (congestive heart failure) ([place])   . Chronic systolic dysfunction of left ventricle    EF 30-35%, CLASS II - III SYMPTOMS; intolerant to Coreg  . CKD (chronic kidney disease) stage 3, GFR 30-59 ml/min ([name]) 06/08/2012  . COPD (chronic obstructive pulmonary disease) ([place])   . Coronary artery disease    History of remote anterior MI with PCI to LAD in 2006; most recent cath 2007, no intervention required  . DOE (dyspnea on exertion)    with heavy exertion  .  Dysrhythmia   . Esophageal dysmotility   . GERD (gastroesophageal reflux disease)   . GERD (gastroesophageal reflux disease)   . Head injury, closed, with concussion 2000ish  . Heart murmur    hx  . HOH (hard of hearing)   . Hyperlipidemia   . Memory loss    improved  . Myocardial infarction ([place]) 1994   ANTERIOR  . Pneumonia "several times"   aspiration pna with at least 3 admits for this 2016.   Marland Kitchen PVC's (premature ventricular contractions)   . Renal failure   . Skin cancer "several"   "forearms; head"  . Type II diabetes mellitus ([place])      Review of Systems  Constitutional: Negative for chills, fatigue and fever.  HENT: Negative for postnasal drip, rhinorrhea and sinus pressure.   Respiratory: Positive for cough. Negative for chest tightness, shortness of breath and wheezing.   Cardiovascular: Negative for chest pain, palpitations and leg swelling.       Objective:   Physical Exam  Vitals:   11/16/17 1044  BP: 108/62  Pulse: 67  SpO2: 97%  RA  Gen: elderly male, well appearing today HENT: OP clear, TM's clear, neck supple PULM: Crackles R base > L B, normal percussion CV: RRR, no mgr, trace edema GI: BS+, soft, nontender Derm: no cyanosis or rash Psyche: normal mood and affect   Echocardiogram: March 2019 LVEF 20-25%  Barium swallow: March 2019  showed high-grade stenosis at the GE junction  January 2019 hospital discharge records reviewed.  He was hospitalized for pneumonia and a COPD exacerbation in the setting of a diagnosis of influenza A.  He was treated with prednisone, Ceftin, and Tamiflu.  Chest imaging: January 2019 chest x-ray images independently reviewed showing nodular opacities in the right lung.    Assessment & Plan:  Community acquired pneumonia of right lower lobe of lung ([place])  COPD exacerbation ([place])  COPD GOLD IV  Achalasia  Discussion: He had another flareup of an acute respiratory event in March, probably precipitated by  his esophageal disease.  I share his gastroenterologist's concern over whether or not he would truly benefit from esophageal stretching though I do worry that the multiple repeated events related to aspiration are related to his esophageal problem.  I believe we could support him through general anesthesia if it is felt that that would be beneficial.  Whether or not dilating his esophagus would fix his problem is unclear to me though as this may precipitate worsening reflux.  Plan: Severe COPD with recurrent exacerbations: Continue Symbicort Continue Spiriva We will refill albuterol today  Esophageal narrowing/stenosis: I will talk to Dr. Silverio Decamp about whether or not we should support 2 through an esophageal dilation procedure.  Follow-up 3 months or sooner if needed  18 minutes spent with the patient and his son, 25-minute visit    Current Outpatient Medications:  .  albuterol (PROVENTIL HFA;VENTOLIN HFA) 108 (90 Base) MCG/ACT inhaler, Inhale 2 puffs into the lungs every 6 (six) hours as needed for wheezing or shortness of breath., Disp: 1 Inhaler, Rfl: 2 .  aspirin 81 MG EC tablet, Take 81 mg by mouth daily.  , Disp: , Rfl:  .  Cholecalciferol (VITAMIN D) 2000 units tablet, Take 2,000 Units by mouth daily. , Disp: , Rfl:  .  donepezil (ARICEPT) 10 MG tablet, Take 1 tablet (10 mg total) by mouth every morning., Disp: 90 tablet, Rfl: 4 .  fenofibrate (TRICOR) 145 MG tablet, TAKE 1 TABLET BY MOUTH EVERY DAY (Patient taking differently: Take 145 mg by mouth once a day), Disp: 90 tablet, Rfl: 3 .  finasteride (PROSCAR) 5 MG tablet, Take 5 mg by mouth daily., Disp: , Rfl: 0 .  furosemide (LASIX) 20 MG tablet, Take 1 tablet (20 mg total) by mouth daily., Disp: 10 tablet, Rfl: 0 .  Insulin Glargine (BASAGLAR KWIKPEN) 100 UNIT/ML SOPN, INJECT 5 UNITS UNDER THE SKIN EVERY MORNING, Disp: 15 mL, Rfl: 0 .  loratadine (CLARITIN) 10 MG tablet, Take 1 tablet (10 mg total) by mouth daily., Disp: 30  tablet, Rfl: 1 .  memantine (NAMENDA) 10 MG tablet, Take 1 tablet (10 mg total) by mouth 2 (two) times daily., Disp: 180 tablet, Rfl: 4 .  Multiple Vitamins-Minerals (PRESERVISION AREDS 2) CAPS, Take 1 capsule by mouth 2 (two) times daily., Disp: , Rfl:  .  nitroGLYCERIN (NITRODUR - DOSED IN MG/24 HR) 0.2 mg/hr patch, Place 1 patch (0.2 mg total) onto the skin daily. Alternates patch placement with odd/even days., Disp: 30 patch, Rfl: 3 .  pantoprazole (PROTONIX) 40 MG tablet, Take 1 tablet (40 mg total) by mouth 2 (two) times daily., Disp: 60 tablet, Rfl: 0 .  pantoprazole (PROTONIX) 40 MG tablet, Take 1 tablet (40 mg total) by mouth daily., Disp: 90 tablet, Rfl: 3 .  pravastatin (PRAVACHOL) 40 MG tablet, Take 1 tablet (40 mg total) by mouth at bedtime., Disp: 90 tablet, Rfl: 3 .  SPIRIVA HANDIHALER 18 MCG inhalation capsule, INHALE CONTENTS OF 1 CAPSULE VIA HANDIHALER ONCE DAILY, Disp: 30 capsule, Rfl: 5 .  SYMBICORT 160-4.5 MCG/ACT inhaler, INHALE 2 PUFFS BY MOUTH TWICE DAILY (Patient taking differently: Inhale 2 puffs into the lungs two times a day), Disp: 10.2 g, Rfl: 4

## 2017-11-16 NOTE — Addendum Note (Signed)
Addended by: Shirlee More R on: 11/16/2017 12:13 PM   Modules accepted: Orders

## 2017-11-22 ENCOUNTER — Telehealth: Payer: Self-pay | Admitting: Cardiology

## 2017-11-22 MED ORDER — NITROGLYCERIN 0.2 MG/HR TD PT24: 0.2000 mg | MEDICATED_PATCH | Freq: Every day | TRANSDERMAL | 6 refills | Status: DC

## 2017-11-22 NOTE — Telephone Encounter (Signed)
°*  STAT* If patient is at the pharmacy, call can be transferred to refill team.   1. Which medications need to be refilled? (please list name of each medication and dose if known) Nitro patch  2. Which pharmacy/location (including street and city if local pharmacy) is medication to be sent to?[facility]  3. Do they need a 30 day or 90 day supply? 90 day supply with 3 refills     pt's son calling for refill, had a really hard time with getting last refill by fax, asking if we can do a verbal, to make sure they get it, he is in [facility] and can't fu as easily

## 2017-11-27 ENCOUNTER — Ambulatory Visit (INDEPENDENT_AMBULATORY_CARE_PROVIDER_SITE_OTHER): Payer: Medicare Other | Admitting: *Deleted

## 2017-11-27 DIAGNOSIS — I255 Ischemic cardiomyopathy: Secondary | ICD-10-CM

## 2017-11-27 NOTE — Progress Notes (Signed)
Remote ICD transmission.   

## 2017-11-28 ENCOUNTER — Encounter: Payer: Self-pay | Admitting: Cardiology

## 2017-11-30 NOTE — Progress Notes (Signed)
Reviewed, agree 

## 2017-12-04 DIAGNOSIS — H903 Sensorineural hearing loss, bilateral: Secondary | ICD-10-CM | POA: Diagnosis not present

## 2017-12-19 ENCOUNTER — Other Ambulatory Visit: Payer: Self-pay

## 2017-12-19 ENCOUNTER — Emergency Department (HOSPITAL_COMMUNITY): Payer: Medicare Other

## 2017-12-19 ENCOUNTER — Inpatient Hospital Stay (HOSPITAL_COMMUNITY): Payer: Medicare Other

## 2017-12-19 ENCOUNTER — Inpatient Hospital Stay (HOSPITAL_COMMUNITY)
Admission: EM | Admit: 2017-12-19 | Discharge: 2017-12-22 | DRG: 872 | Disposition: A | Discharge status: Home / Self Care | Payer: Medicare Other | Attending: Internal Medicine | Admitting: Internal Medicine

## 2017-12-19 ENCOUNTER — Telehealth: Payer: Self-pay | Admitting: Pulmonary Disease

## 2017-12-19 ENCOUNTER — Encounter (HOSPITAL_COMMUNITY): Payer: Self-pay | Admitting: Emergency Medicine

## 2017-12-19 DIAGNOSIS — Z9842 Cataract extraction status, left eye: Secondary | ICD-10-CM

## 2017-12-19 DIAGNOSIS — I454 Nonspecific intraventricular block: Secondary | ICD-10-CM | POA: Diagnosis present

## 2017-12-19 DIAGNOSIS — J449 Chronic obstructive pulmonary disease, unspecified: Secondary | ICD-10-CM | POA: Diagnosis present

## 2017-12-19 DIAGNOSIS — R531 Weakness: Secondary | ICD-10-CM

## 2017-12-19 DIAGNOSIS — I252 Old myocardial infarction: Secondary | ICD-10-CM

## 2017-12-19 DIAGNOSIS — K210 Gastro-esophageal reflux disease with esophagitis: Secondary | ICD-10-CM | POA: Diagnosis not present

## 2017-12-19 DIAGNOSIS — E785 Hyperlipidemia, unspecified: Secondary | ICD-10-CM | POA: Diagnosis present

## 2017-12-19 DIAGNOSIS — K219 Gastro-esophageal reflux disease without esophagitis: Secondary | ICD-10-CM | POA: Diagnosis present

## 2017-12-19 DIAGNOSIS — Z85828 Personal history of other malignant neoplasm of skin: Secondary | ICD-10-CM

## 2017-12-19 DIAGNOSIS — I959 Hypotension, unspecified: Secondary | ICD-10-CM | POA: Diagnosis present

## 2017-12-19 DIAGNOSIS — Z7982 Long term (current) use of aspirin: Secondary | ICD-10-CM

## 2017-12-19 DIAGNOSIS — I2510 Atherosclerotic heart disease of native coronary artery without angina pectoris: Secondary | ICD-10-CM | POA: Diagnosis not present

## 2017-12-19 DIAGNOSIS — N183 Chronic kidney disease, stage 3 unspecified: Secondary | ICD-10-CM

## 2017-12-19 DIAGNOSIS — Z8701 Personal history of pneumonia (recurrent): Secondary | ICD-10-CM | POA: Diagnosis not present

## 2017-12-19 DIAGNOSIS — N400 Enlarged prostate without lower urinary tract symptoms: Secondary | ICD-10-CM | POA: Diagnosis present

## 2017-12-19 DIAGNOSIS — R1319 Other dysphagia: Secondary | ICD-10-CM

## 2017-12-19 DIAGNOSIS — A419 Sepsis, unspecified organism: Secondary | ICD-10-CM | POA: Diagnosis not present

## 2017-12-19 DIAGNOSIS — Z87891 Personal history of nicotine dependence: Secondary | ICD-10-CM

## 2017-12-19 DIAGNOSIS — J690 Pneumonitis due to inhalation of food and vomit: Secondary | ICD-10-CM | POA: Diagnosis not present

## 2017-12-19 DIAGNOSIS — Z7951 Long term (current) use of inhaled steroids: Secondary | ICD-10-CM

## 2017-12-19 DIAGNOSIS — I5022 Chronic systolic (congestive) heart failure: Secondary | ICD-10-CM | POA: Diagnosis present

## 2017-12-19 DIAGNOSIS — Z885 Allergy status to narcotic agent status: Secondary | ICD-10-CM

## 2017-12-19 DIAGNOSIS — Z95810 Presence of automatic (implantable) cardiac defibrillator: Secondary | ICD-10-CM | POA: Diagnosis not present

## 2017-12-19 DIAGNOSIS — E1122 Type 2 diabetes mellitus with diabetic chronic kidney disease: Secondary | ICD-10-CM | POA: Diagnosis not present

## 2017-12-19 DIAGNOSIS — K220 Achalasia of cardia: Secondary | ICD-10-CM | POA: Diagnosis present

## 2017-12-19 DIAGNOSIS — I519 Heart disease, unspecified: Secondary | ICD-10-CM | POA: Diagnosis present

## 2017-12-19 DIAGNOSIS — Z9841 Cataract extraction status, right eye: Secondary | ICD-10-CM

## 2017-12-19 DIAGNOSIS — D72829 Elevated white blood cell count, unspecified: Secondary | ICD-10-CM

## 2017-12-19 DIAGNOSIS — Z79899 Other long term (current) drug therapy: Secondary | ICD-10-CM

## 2017-12-19 DIAGNOSIS — R1310 Dysphagia, unspecified: Secondary | ICD-10-CM

## 2017-12-19 DIAGNOSIS — Z882 Allergy status to sulfonamides status: Secondary | ICD-10-CM

## 2017-12-19 DIAGNOSIS — R509 Fever, unspecified: Secondary | ICD-10-CM | POA: Diagnosis not present

## 2017-12-19 DIAGNOSIS — Z794 Long term (current) use of insulin: Secondary | ICD-10-CM | POA: Diagnosis not present

## 2017-12-19 DIAGNOSIS — J440 Chronic obstructive pulmonary disease with acute lower respiratory infection: Secondary | ICD-10-CM

## 2017-12-19 DIAGNOSIS — F0390 Unspecified dementia without behavioral disturbance: Secondary | ICD-10-CM | POA: Diagnosis present

## 2017-12-19 DIAGNOSIS — Z961 Presence of intraocular lens: Secondary | ICD-10-CM | POA: Diagnosis present

## 2017-12-19 DIAGNOSIS — R1314 Dysphagia, pharyngoesophageal phase: Secondary | ICD-10-CM | POA: Diagnosis present

## 2017-12-19 DIAGNOSIS — Z888 Allergy status to other drugs, medicaments and biological substances status: Secondary | ICD-10-CM

## 2017-12-19 DIAGNOSIS — R031 Nonspecific low blood-pressure reading: Secondary | ICD-10-CM | POA: Diagnosis not present

## 2017-12-19 DIAGNOSIS — T17908A Unspecified foreign body in respiratory tract, part unspecified causing other injury, initial encounter: Secondary | ICD-10-CM | POA: Diagnosis present

## 2017-12-19 LAB — CUP PACEART REMOTE DEVICE CHECK
Date Time Interrogation Session: 20190521085847
Implantable Lead Implant Date: 20120829
Implantable Lead Location: 753859
Implantable Lead Location: 753860
MDC IDC LEAD IMPLANT DT: 20120829
MDC IDC PG IMPLANT DT: 20120829
Pulse Gen Serial Number: 828604

## 2017-12-19 LAB — URINALYSIS, ROUTINE W REFLEX MICROSCOPIC
Bilirubin Urine: NEGATIVE
GLUCOSE, UA: NEGATIVE mg/dL
HGB URINE DIPSTICK: NEGATIVE
Ketones, ur: NEGATIVE mg/dL
Leukocytes, UA: NEGATIVE
Nitrite: NEGATIVE
PROTEIN: NEGATIVE mg/dL
Specific Gravity, Urine: 1.015 (ref 1.005–1.030)
pH: 5 (ref 5.0–8.0)

## 2017-12-19 LAB — I-STAT TROPONIN, ED: TROPONIN I, POC: 0.03 ng/mL (ref 0.00–0.08)

## 2017-12-19 LAB — BASIC METABOLIC PANEL
Anion gap: 9 (ref 5–15)
BUN: 38 mg/dL — ABNORMAL HIGH (ref 6–20)
CHLORIDE: 104 mmol/L (ref 101–111)
CO2: 28 mmol/L (ref 22–32)
CREATININE: 2.11 mg/dL — AB (ref 0.61–1.24)
Calcium: 9 mg/dL (ref 8.9–10.3)
GFR calc Af Amer: 32 mL/min — ABNORMAL LOW (ref >60)
GFR calc non Af Amer: 28 mL/min — ABNORMAL LOW (ref >60)
Glucose, Bld: 146 mg/dL — ABNORMAL HIGH (ref 65–99)
Potassium: 4.3 mmol/L (ref 3.5–5.1)
Sodium: 141 mmol/L (ref 135–145)

## 2017-12-19 LAB — CBC
HCT: 41.1 % (ref 39.0–52.0)
Hemoglobin: 12.6 g/dL — ABNORMAL LOW (ref 13.0–17.0)
MCH: 27.6 pg (ref 26.0–34.0)
MCHC: 30.7 g/dL (ref 30.0–36.0)
MCV: 90.1 fL (ref 78.0–100.0)
PLATELETS: 195 10*3/uL (ref 150–400)
RBC: 4.56 MIL/uL (ref 4.22–5.81)
RDW: 15.7 % — AB (ref 11.5–15.5)
WBC: 19.8 10*3/uL — ABNORMAL HIGH (ref 4.0–10.5)

## 2017-12-19 LAB — I-STAT CG4 LACTIC ACID, ED
LACTIC ACID, VENOUS: 1.32 mmol/L (ref 0.5–1.9)
Lactic Acid, Venous: 2.41 mmol/L (ref 0.5–1.9)

## 2017-12-19 LAB — CBG MONITORING, ED: Glucose-Capillary: 138 mg/dL — ABNORMAL HIGH (ref 65–99)

## 2017-12-19 MED ORDER — VANCOMYCIN HCL IN DEXTROSE 750-5 MG/150ML-% IV SOLN
750.0000 mg | Freq: Two times a day (BID) | INTRAVENOUS | Status: DC
  Administered 2017-12-20 – 2017-12-21 (×2): 750 mg via INTRAVENOUS
  Filled 2017-12-19 (×3): qty 150

## 2017-12-19 MED ORDER — ONDANSETRON HCL 4 MG/2ML IJ SOLN: 4.0000 mg | Freq: Four times a day (QID) | INTRAMUSCULAR | Status: DC | PRN

## 2017-12-19 MED ORDER — ASPIRIN EC 81 MG PO TBEC
81.0000 mg | DELAYED_RELEASE_TABLET | Freq: Every day | ORAL | Status: DC
  Administered 2017-12-20 – 2017-12-22 (×3): 81 mg via ORAL
  Filled 2017-12-19 (×3): qty 1

## 2017-12-19 MED ORDER — HEPARIN SODIUM (PORCINE) 5000 UNIT/ML IJ SOLN
5000.0000 [IU] | Freq: Three times a day (TID) | INTRAMUSCULAR | Status: DC
Start: 2017-12-19 — End: 2017-12-22
  Administered 2017-12-19 – 2017-12-21 (×8): 5000 [IU] via SUBCUTANEOUS
  Filled 2017-12-19 (×8): qty 1

## 2017-12-19 MED ORDER — SENNOSIDES-DOCUSATE SODIUM 8.6-50 MG PO TABS
1.0000 | ORAL_TABLET | Freq: Every evening | ORAL | Status: DC | PRN
  Filled 2017-12-19: qty 1

## 2017-12-19 MED ORDER — SODIUM CHLORIDE 0.9 % IV BOLUS
500.0000 mL | Freq: Once | INTRAVENOUS | Status: AC
  Administered 2017-12-19: 500 mL via INTRAVENOUS

## 2017-12-19 MED ORDER — VANCOMYCIN HCL IN DEXTROSE 1-5 GM/200ML-% IV SOLN: 1000.0000 mg | Freq: Once | INTRAVENOUS | Status: DC

## 2017-12-19 MED ORDER — ALBUTEROL SULFATE (2.5 MG/3ML) 0.083% IN NEBU: 3.0000 mL | INHALATION_SOLUTION | Freq: Four times a day (QID) | RESPIRATORY_TRACT | Status: DC | PRN

## 2017-12-19 MED ORDER — MEMANTINE HCL 10 MG PO TABS
10.0000 mg | ORAL_TABLET | Freq: Two times a day (BID) | ORAL | Status: DC
  Administered 2017-12-19 – 2017-12-22 (×6): 10 mg via ORAL
  Filled 2017-12-19 (×6): qty 1

## 2017-12-19 MED ORDER — ACETAMINOPHEN 325 MG PO TABS: 650.0000 mg | ORAL_TABLET | Freq: Four times a day (QID) | ORAL | Status: DC | PRN

## 2017-12-19 MED ORDER — ONDANSETRON HCL 4 MG PO TABS: 4.0000 mg | ORAL_TABLET | Freq: Four times a day (QID) | ORAL | Status: DC | PRN

## 2017-12-19 MED ORDER — DONEPEZIL HCL 10 MG PO TABS
10.0000 mg | ORAL_TABLET | Freq: Every morning | ORAL | Status: DC
  Administered 2017-12-20 – 2017-12-22 (×3): 10 mg via ORAL
  Filled 2017-12-19 (×3): qty 1

## 2017-12-19 MED ORDER — VITAMIN D 1000 UNITS PO TABS
2000.0000 [IU] | ORAL_TABLET | Freq: Every day | ORAL | Status: DC
  Administered 2017-12-20 – 2017-12-22 (×3): 2000 [IU] via ORAL
  Filled 2017-12-19 (×3): qty 2

## 2017-12-19 MED ORDER — FINASTERIDE 5 MG PO TABS
5.0000 mg | ORAL_TABLET | Freq: Every day | ORAL | Status: DC
  Administered 2017-12-20 – 2017-12-22 (×3): 5 mg via ORAL
  Filled 2017-12-19 (×3): qty 1

## 2017-12-19 MED ORDER — PRAVASTATIN SODIUM 40 MG PO TABS
40.0000 mg | ORAL_TABLET | Freq: Every day | ORAL | Status: DC
  Administered 2017-12-19 – 2017-12-21 (×3): 40 mg via ORAL
  Filled 2017-12-19 (×3): qty 1

## 2017-12-19 MED ORDER — IPRATROPIUM-ALBUTEROL 0.5-2.5 (3) MG/3ML IN SOLN
3.0000 mL | Freq: Once | RESPIRATORY_TRACT | Status: AC
  Administered 2017-12-19: 3 mL via RESPIRATORY_TRACT
  Filled 2017-12-19: qty 3

## 2017-12-19 MED ORDER — SODIUM CHLORIDE 0.9 % IV SOLN
INTRAVENOUS | Status: DC
  Administered 2017-12-19: 18:00:00 via INTRAVENOUS

## 2017-12-19 MED ORDER — MOMETASONE FURO-FORMOTEROL FUM 200-5 MCG/ACT IN AERO
2.0000 | INHALATION_SPRAY | Freq: Two times a day (BID) | RESPIRATORY_TRACT | Status: DC
  Administered 2017-12-20 – 2017-12-22 (×5): 2 via RESPIRATORY_TRACT
  Filled 2017-12-19: qty 8.8

## 2017-12-19 MED ORDER — PIPERACILLIN-TAZOBACTAM 3.375 G IVPB
3.3750 g | Freq: Three times a day (TID) | INTRAVENOUS | Status: DC
  Administered 2017-12-19 – 2017-12-21 (×5): 3.375 g via INTRAVENOUS
  Filled 2017-12-19 (×5): qty 50

## 2017-12-19 MED ORDER — ACETAMINOPHEN 650 MG RE SUPP: 650.0000 mg | Freq: Four times a day (QID) | RECTAL | Status: DC | PRN

## 2017-12-19 MED ORDER — TIOTROPIUM BROMIDE MONOHYDRATE 18 MCG IN CAPS
18.0000 ug | ORAL_CAPSULE | Freq: Every day | RESPIRATORY_TRACT | Status: DC
  Administered 2017-12-20 – 2017-12-22 (×3): 18 ug via RESPIRATORY_TRACT
  Filled 2017-12-19: qty 5

## 2017-12-19 MED ORDER — PANTOPRAZOLE SODIUM 40 MG PO TBEC
40.0000 mg | DELAYED_RELEASE_TABLET | Freq: Two times a day (BID) | ORAL | Status: DC
  Administered 2017-12-19 – 2017-12-22 (×6): 40 mg via ORAL
  Filled 2017-12-19 (×6): qty 1

## 2017-12-19 MED ORDER — INSULIN ASPART 100 UNIT/ML ~~LOC~~ SOLN
0.0000 [IU] | Freq: Three times a day (TID) | SUBCUTANEOUS | Status: DC
  Administered 2017-12-20 – 2017-12-21 (×4): 1 [IU] via SUBCUTANEOUS

## 2017-12-19 MED ORDER — BISACODYL 10 MG RE SUPP: 10.0000 mg | Freq: Every day | RECTAL | Status: DC | PRN

## 2017-12-19 MED ORDER — SODIUM CHLORIDE 0.9 % IV SOLN
1500.0000 mg | Freq: Once | INTRAVENOUS | Status: AC
  Administered 2017-12-19: 1500 mg via INTRAVENOUS
  Filled 2017-12-19: qty 1500

## 2017-12-19 MED ORDER — PIPERACILLIN-TAZOBACTAM 3.375 G IVPB 30 MIN
3.3750 g | Freq: Once | INTRAVENOUS | Status: AC
  Administered 2017-12-19: 3.375 g via INTRAVENOUS
  Filled 2017-12-19: qty 50

## 2017-12-19 NOTE — ED Provider Notes (Signed)
Medical screening examination/treatment/procedure(s) were conducted as a shared visit with non-physician practitioner(s) and myself.  I personally evaluated the patient during the encounter.  Clinical Impression:   Final diagnoses:  Sepsis, due to unspecified organism [facility])    The patient is an elderly 84 year old male, lives by himself, is usually very active walking around his several acre piece of property, he is taking care of peripherally by his kids to come to check on him daily either by phone or in person.  Today he was reported to be generally weak, having a difficult time getting around, possibly having a fever.  The patient has no specific other complaints other than being a little short of breath.  He does have a history of COPD, he denies swelling of the legs.  On exam the patient does have a rectal temperature of 100.2, he has no edema, the lungs are otherwise pretty clear with occasional rhonchi, he is able to speak in full sentences.  Abdomen is nontender.  Will need work-up for new onset fatigue, fever, generalized weakness.  This includes chest x-ray labs.  EKG shows a bundle branch block consistent with prior EKGs just the rate is faster.  The pt is critically ill - has persistent mild hypotension, no definitive source.   Noemi Chapel, MD 12/19/17 [phone]

## 2017-12-19 NOTE — Progress Notes (Signed)
Pharmacy Antibiotic Note  Antonio Ray is a 84 y.o. male admitted on 12/19/2017 with sepsis.  Pharmacy has been consulted for Zosyn and vancomycin dosing. Pt is here with generalized weakness and fever. WBC 19.8. LA 2.41. SCr 2.11. CrCl ~ 20-25 mL/min   Plan: -Zosyn 3.375 gm IV q 8 hours (EI infusion) -Vancomycin 1500 mg IV once, then vancomycin 750 mg IV Q 24 hours -Monitor CBC, renal fx, cultures and clinical progress -VT at SS   Height: 5\' 8"  (172.7 cm) Weight: 156 lb (70.8 kg) IBW/kg (Calculated) : 68.4  Temp (24hrs), Avg:99.5 F (37.5 C), Min:98.8 F (37.1 C), Max:100.2 F (37.9 C)  Recent Labs  Lab 12/19/17 1431 12/19/17 1441  WBC 19.8*  --   CREATININE 2.11*  --   LATICACIDVEN  --  2.41*    Estimated Creatinine Clearance: 26.1 mL/min (A) (by C-G formula based on SCr of 2.11 mg/dL (H)).    Allergies  Allergen Reactions  . Codeine Other (See Comments)    Gets very angry, disoriented  . Dilaudid [Hydromorphone Hcl] Other (See Comments)    VERY AGITATED, HOSTILE  . Flomax [Tamsulosin Hcl] Shortness Of Breath  . Morphine And Related Other (See Comments)    VERY AGITATED, HOSTILE  . Sulfa Antibiotics Shortness Of Breath  . Beta Adrenergic Blockers Other (See Comments)    Disorientation  . Carvedilol Other (See Comments)    DISORIENTATION    Antimicrobials this admission: Vanc 5/21 >>  Zosyn 5/21 >>   Dose adjustments this admission: None   Microbiology results:   Thank you for allowing pharmacy to be a part of this patient's care.  Albertina Parr, PharmD., BCPS Clinical Pharmacist Clinical phone for 12/19/17 until 11pm: [phone] If after 11pm, please call main pharmacy at: [phone]

## 2017-12-19 NOTE — ED Provider Notes (Addendum)
[facility] EMERGENCY DEPARTMENT Provider Note   CSN: [id number] Arrival date & time: 12/19/17  1400     History   Chief Complaint Chief Complaint  Patient presents with  . Dizziness  . Fever    HPI Antonio Ray is a 84 y.o. male.  HPI Antonio Ray is a 84 y.o. male with history of coronary disease, COPD, CHF, history of pneumonia, presents to emergency department complaining of generalized weakness and near syncopal episode and confusion.  Most of the history provided by EMS at this time.  Patient is coming from home, according to the son, patient has been more weak than usual, has had a cough, not able to ambulate as he normally does.  "Low-grade temp" at home, where unsure what the numbers at this time.  By EMS, normal temperature.  Patient denies any complaints.  Specifically denies any chest pain or shortness of breath.  He states he "just did not feel like going from a usual walk today."  Denies any nausea or vomiting.  No diarrhea.  No urinary symptoms. Poor historian  2:53 PM Son at bedside. States patient has been complaining of chills this morning and decreased activity which he normally gets with infections.  He has not had an increased cough according to him.  No other associated symptoms.  Patient has achalasia and has recurrent aspiration pneumonia.  He received Tylenol just prior to coming to the ER   Because he felt warm.  Past Medical History:  Diagnosis Date  . Achalasia   . AICD (automatic cardioverter/defibrillator) present 03/30/2011   Analyze ST study patient  . Anginal pain ([place])   . BPH (benign prostatic hyperplasia)   . CHF (congestive heart failure) ([place])   . Chronic systolic dysfunction of left ventricle    EF 30-35%, CLASS II - III SYMPTOMS; intolerant to Coreg  . CKD (chronic kidney disease) stage 3, GFR 30-59 ml/min ([name]) 06/08/2012  . COPD (chronic obstructive pulmonary disease) ([place])   . Coronary artery disease    History of  remote anterior MI with PCI to LAD in 2006; most recent cath 2007, no intervention required  . DOE (dyspnea on exertion)    with heavy exertion  . Dysrhythmia   . Esophageal dysmotility   . GERD (gastroesophageal reflux disease)   . GERD (gastroesophageal reflux disease)   . Head injury, closed, with concussion 2000ish  . Heart murmur    hx  . HOH (hard of hearing)   . Hyperlipidemia   . Memory loss    improved  . Myocardial infarction ([place]) 1994   ANTERIOR  . Pneumonia "several times"   aspiration pna with at least 3 admits for this 2016.   Marland Kitchen PVC's (premature ventricular contractions)   . Renal failure   . Skin cancer "several"   "forearms; head"  . Type II diabetes mellitus [facility])     Patient Active Problem List   Diagnosis Date Noted  . Hypoxemia   . Pneumonia, aspiration ([place]) 10/13/2017  . CAP (community acquired pneumonia) 08/19/2017  . Influenza A with pneumonia 08/19/2017  . Type II diabetes mellitus ([place])   . Skin cancer   . Renal failure   . HOH (hard of hearing)   . Heart murmur   . Head injury, closed, with concussion   . Esophageal dysmotility   . Dysrhythmia   . DOE (dyspnea on exertion)   . Chronic systolic dysfunction of left ventricle   . CHF (congestive heart  failure) ([place])   . Anginal pain ([place])   . Pneumonia 06/12/2017  . Acute UTI   . Altered mental state 02/15/2017  . Achalasia of esophagus   . Encephalopathy   . Fever chills   . SOB (shortness of breath)   . Acute respiratory failure with hypoxemia ([place])   . Acute respiratory failure ([place]) 04/21/2016  . Chronic combined systolic and diastolic congestive heart failure ([place]) 04/21/2016  . Memory loss 04/21/2016  . Allergic rhinitis 04/11/2016  . Chronic systolic CHF (congestive heart failure) ([place]) 10/30/2015  . Mild cognitive impairment 08/18/2015  . Difficulty in swallowing   . AKI (acute kidney injury) ([place])   . Type 2 diabetes mellitus with stage 3 chronic kidney disease, with  long-term current use of insulin ([place])   . Chronic systolic congestive heart failure ([place])   . Chronic kidney disease, stage III (moderate) ([name])   . Other specified hypotension   . CKD (chronic kidney disease), stage III ([place])   . Other emphysema ([place])   . Systolic CHF, acute on chronic ([name])   . AICD (automatic cardioverter/defibrillator) present   . CAD S/P percutaneous coronary angioplasty   . Acute respiratory failure with hypoxia ([place]) 04/03/2015  . Cardiomyopathy, ischemic 04/03/2015  . Elevated troponin 04/03/2015  . Sepsis ([place]) 03/31/2015  . COPD exacerbation ([place])   . Aspiration into airway 03/21/2015  . Achalasia 03/21/2015  . Dysphagia   . Esophageal dysphagia   . Esophageal dysmotilities   . COPD with acute exacerbation ([place]) 01/14/2015  . Left ventricular apical thrombus 01/14/2015  . Acute on chronic systolic CHF (congestive heart failure) ([place]) 01/14/2015  . ARF (acute renal failure) ([place]) 01/14/2015  . Dysphagia, unspecified(787.20) 10/30/2013  . COPD GOLD IV 10/07/2013  . Leukocytosis 09/11/2013  . Acute encephalopathy 09/10/2013  . Renal failure (ARF), acute on chronic ([name]) 09/10/2013  . Automatic implantable cardioverter-defibrillator in situ 07/06/2012  . CKD (chronic kidney disease) stage 3, GFR 30-59 ml/min ([name]) 06/08/2012  . GERD (gastroesophageal reflux disease) 09/26/2011  . BPH (benign prostatic hyperplasia) 08/22/2011  . COPD (chronic obstructive pulmonary disease) ([place]) 07/17/2011  . S/P ICD (internal cardiac defibrillator) procedure 04/20/2011  . Coronary artery disease   . Sick sinus syndrome ([place])   . Acute on chronic combined systolic and diastolic congestive heart failure, NYHA class 3 ([place])   . Hyperlipidemia   . PVC's (premature ventricular contractions)   . Myocardial infarction [facility]) 08/01/1992    Past Surgical History:  Procedure Laterality Date  . BALLOON DILATION N/A 09/09/2015   Procedure: BALLOON DILATION;  Surgeon: Mauri Pole, MD;  Location: [facility] ENDOSCOPY;  Service: Endoscopy;  Laterality: N/A;  rigiflex achalasia balloon dilators  . BOTOX INJECTION N/A 04/08/2015   Procedure: BOTOX INJECTION;  Surgeon: Milus Banister, MD;  Location: [facility];  Service: Endoscopy;  Laterality: N/A;  . CARDIAC CATHETERIZATION  01/11/2006   DEMONSTRATES AKINESIA OF THE DISTAL ANTERIOR WALL, DISTAL INFERIOR WALL AND AKINESIA OF THE APEX. THE BASAL SEGMENTS CONTRACT WELL AND OVERALL EF 35%  . CATARACT EXTRACTION W/ INTRAOCULAR LENS  IMPLANT, BILATERAL  08/2014-09/2014  . CHOLECYSTECTOMY  2/11  . CORONARY ANGIOPLASTY  1994   TO THE LAD  . ESOPHAGEAL MANOMETRY N/A 03/16/2015   Procedure: ESOPHAGEAL MANOMETRY (EM);  Surgeon: Jerene Bears, MD;  Location: [facility] ENDOSCOPY;  Service: Gastroenterology;  Laterality: N/A;  . ESOPHAGOGASTRODUODENOSCOPY N/A 04/08/2015   Procedure: ESOPHAGOGASTRODUODENOSCOPY (EGD);  Surgeon: Milus Banister, MD;  Location: [facility];  Service: Endoscopy;  Laterality: N/A;  . ESOPHAGOGASTRODUODENOSCOPY (EGD) WITH PROPOFOL N/A 09/09/2015   Procedure: ESOPHAGOGASTRODUODENOSCOPY (EGD) WITH PROPOFOL;  Surgeon: Mauri Pole, MD;  Location: [facility] ENDOSCOPY;  Service: Endoscopy;  Laterality: N/A;  pt. is to have gastrografin xray post recovery-do not discharge until results are back  . EYE SURGERY    . FOOT FRACTURE SURGERY Right 1980's  . INSERT / REPLACE / REMOVE PACEMAKER    . SKIN CANCER EXCISION  "several"   "forearms, head"        Home Medications    Prior to Admission medications   Medication Sig Start Date End Date Taking? Authorizing Provider  albuterol (PROVENTIL HFA;VENTOLIN HFA) 108 (90 Base) MCG/ACT inhaler Inhale 2 puffs into the lungs every 6 (six) hours as needed for wheezing or shortness of breath. 11/16/17   Juanito Doom, MD  aspirin 81 MG EC tablet Take 81 mg by mouth daily.      [provider]  Cholecalciferol (VITAMIN D) 2000 units tablet Take 2,000 Units by mouth daily.      [provider]  donepezil (ARICEPT) 10 MG tablet Take 1 tablet (10 mg total) by mouth every morning. 05/01/17   Marcial Pacas, MD  fenofibrate (TRICOR) 145 MG tablet TAKE 1 TABLET BY MOUTH EVERY DAY Patient taking differently: Take 145 mg by mouth once a day 02/10/17   Martinique, Peter M, MD  finasteride (PROSCAR) 5 MG tablet Take 5 mg by mouth daily. 05/19/17   [provider]  furosemide (LASIX) 20 MG tablet Take 1 tablet (20 mg total) by mouth daily. 10/16/17   Regalado, Belkys A, MD  Insulin Glargine (BASAGLAR KWIKPEN) 100 UNIT/ML SOPN INJECT 5 UNITS UNDER THE SKIN EVERY MORNING 10/16/17   Regalado, Belkys A, MD  loratadine (CLARITIN) 10 MG tablet Take 1 tablet (10 mg total) by mouth daily. 07/02/15   Barton Dubois, MD  memantine (NAMENDA) 10 MG tablet Take 1 tablet (10 mg total) by mouth 2 (two) times daily. 05/01/17   Marcial Pacas, MD  Multiple Vitamins-Minerals (PRESERVISION AREDS 2) CAPS Take 1 capsule by mouth 2 (two) times daily.    [provider]  nitroGLYCERIN (NITRODUR - DOSED IN MG/24 HR) 0.2 mg/hr patch Place 1 patch (0.2 mg total) onto the skin daily. Alternates patch placement with odd/even days. 11/22/17   [place]ique, Peter M, MD  pantoprazole (PROTONIX) 40 MG tablet Take 1 tablet (40 mg total) by mouth 2 (two) times daily. 10/16/17   Regalado, Belkys A, MD  pantoprazole (PROTONIX) 40 MG tablet Take 1 tablet (40 mg total) by mouth daily. 10/23/17   Mauri Pole, MD  pravastatin (PRAVACHOL) 40 MG tablet Take 1 tablet (40 mg total) by mouth at bedtime. 05/26/17   [place]ique, Peter M, MD  SPIRIVA HANDIHALER 18 MCG inhalation capsule INHALE CONTENTS OF 1 CAPSULE VIA HANDIHALER ONCE DAILY 06/08/17   Juanito Doom, MD  SYMBICORT 160-4.5 MCG/ACT inhaler INHALE 2 PUFFS BY MOUTH TWICE DAILY Patient taking differently: Inhale 2 puffs into the lungs two times a day 07/31/17   Juanito Doom, MD    Family History Family History  Problem Relation Age of Onset  .  Stroke Father        Mother history unknown - never met her  . Colon cancer Neg Hx     Social History Social History   Tobacco Use  . Smoking status: Former Smoker    Packs/day: 1.00    Years: 35.00    Pack years: 35.00  Types: Cigarettes    Last attempt to quit: 08/01/1992    Years since quitting: 25.4  . Smokeless tobacco: Former Systems developer    Quit date: 1994  Substance Use Topics  . Alcohol use: No    Alcohol/week: 0.0 oz  . Drug use: No     Allergies   Codeine; Dilaudid [hydromorphone hcl]; Flomax [tamsulosin hcl]; Morphine and related; Sulfa antibiotics; Beta adrenergic blockers; and Carvedilol   Review of Systems Review of Systems  Constitutional: Positive for fatigue and fever. Negative for chills.  Respiratory: Positive for cough. Negative for chest tightness and shortness of breath.   Cardiovascular: Negative for chest pain, palpitations and leg swelling.  Gastrointestinal: Negative for abdominal distention, abdominal pain, diarrhea, nausea and vomiting.  Genitourinary: Negative for dysuria, frequency, hematuria and urgency.  Musculoskeletal: Negative for arthralgias, myalgias, neck pain and neck stiffness.  Skin: Negative for rash.  Allergic/Immunologic: Negative for immunocompromised state.  Neurological: Positive for weakness. Negative for dizziness, light-headedness, numbness and headaches.  All other systems reviewed and are negative.    Physical Exam Updated Vital Signs SpO2 92%   Physical Exam  Constitutional: He appears well-developed and well-nourished. No distress.  HENT:  Head: Normocephalic and atraumatic.  Eyes: Conjunctivae are normal.  Neck: Neck supple.  Cardiovascular: Normal rate, regular rhythm and normal heart sounds.  Pulmonary/Chest: Effort normal. No respiratory distress. He has wheezes. He has no rales.  Diminished air movement bilaterally, mild end expiratory wheezes  Abdominal: Soft. Bowel sounds are normal. He exhibits no  distension. There is no tenderness. There is no rebound.  Musculoskeletal: He exhibits no edema.  Neurological: He is alert. No cranial nerve deficit.  5/5 and equal bilateral upper and lower extremity stregth  Skin: Skin is warm and dry.  Nursing note and vitals reviewed.    ED Treatments / Results  Labs (all labs ordered are listed, but only abnormal results are displayed) Labs Reviewed  BASIC METABOLIC PANEL - Abnormal; Notable for the following components:      Result Value   Glucose, Bld 146 (*)    BUN 38 (*)    Creatinine, Ser 2.11 (*)    GFR calc non Af Amer 28 (*)    GFR calc Af Amer 32 (*)    All other components within normal limits  CBC - Abnormal; Notable for the following components:   WBC 19.8 (*)    Hemoglobin 12.6 (*)    RDW 15.7 (*)    All other components within normal limits  CBG MONITORING, ED - Abnormal; Notable for the following components:   Glucose-Capillary 138 (*)    All other components within normal limits  I-STAT CG4 LACTIC ACID, ED - Abnormal; Notable for the following components:   Lactic Acid, Venous 2.41 (*)    All other components within normal limits  CULTURE, BLOOD (ROUTINE X 2)  CULTURE, BLOOD (ROUTINE X 2)  URINE CULTURE  CULTURE, EXPECTORATED SPUTUM-ASSESSMENT  URINALYSIS, ROUTINE W REFLEX MICROSCOPIC  PROCALCITONIN  BASIC METABOLIC PANEL  CBC  I-STAT TROPONIN, ED  I-STAT CG4 LACTIC ACID, ED    EKG EKG Interpretation  Date/Time:  Tuesday Dec 19 2017 14:22:44 EDT Ventricular Rate:  89 PR Interval:    QRS Duration: 155 QT Interval:  389 QTC Calculation: 474 R Axis:   139 Text Interpretation:  Sinus rhythm Consider left atrial enlargement Nonspecific intraventricular conduction delay Inferior infarct, age indeterminate Anterior infarct, old Lateral leads are also involved Since last tracing rate faster Confirmed by Noemi Chapel (  [id number]) on 12/19/2017 2:49:36 PM   Radiology Dg Chest 2 View  Result Date: 12/19/2017 CLINICAL  DATA:  Generalized weakness and fever. EXAM: CHEST - 2 VIEW COMPARISON:  10/14/2017. FINDINGS: Unchanged cardiomediastinal silhouette. Dual lead pacer is stable. Upper lobe RIGHT greater than LEFT pulmonary opacities do not appear to have progressed. No consolidation or edema. No effusion or pneumothorax. IMPRESSION: Stable chest. Electronically Signed   By: Staci Righter M.D.   On: 12/19/2017 15:36    Procedures Procedures (including critical care time)  CRITICAL CARE Performed by: Zayda Angell Total critical care time: 30 minutes Critical care time was exclusive of separately billable procedures and treating other patients. Critical care was necessary to treat or prevent imminent or life-threatening deterioration. Critical care was time spent personally by me on the following activities: development of treatment plan with patient and/or surrogate as well as nursing, discussions with consultants, evaluation of patient's response to treatment, examination of patient, obtaining history from patient or surrogate, ordering and performing treatments and interventions, ordering and review of laboratory studies, ordering and review of radiographic studies, pulse oximetry and re-evaluation of patient's condition.  Medications Ordered in ED Medications - No data to display   Initial Impression / Assessment and Plan / ED Course  I have reviewed the triage vital signs and the nursing notes.  Pertinent labs & imaging results that were available during my care of the patient were reviewed by me and considered in my medical decision making (see chart for details).     Patient with generalized weakness and near syncopal episode at home.  I will get more history from the son when he gets here.  Patient unable to provide much history and denies any complaints.  He is afebrile here, vital signs all within normal.  Will check labs, chest x-ray, urine analysis, get rectal temperature.  2:54  PM Patient's son is now at bedside.  States the patient has had chills this morning and did not take his usual walk.  Generally weak.  Similar symptoms in the past when he was hospitalized with pneumonia.  Patient has had to be intubated in the past.  He is full code.  He did receive Tylenol just prior to arrival because he felt warm at home.    Patient's lactic acid came back elevated at 2.41.  His blood pressure is 99/45.  I will give him 500 cc of saline, and reassess.  According to son, patient's blood pressure is normally around the same.  3:08 PM Repeat temp is 100.2, pt now tacheipnyc, resp rate 24, bp still 98J systolic, HR 91, meets SIRS criteria. Will call code sepsis, and order antibiotics. Source of infection not clear at this time. Will give small boluses of fluid with reassessment between each due to hx of CHF.   Sepsis - Repeat Assessment  Performed at:    4:30 PM  Vitals     Blood pressure (!) 94/40, pulse 70, temperature 100.2 F (37.9 C), temperature source Oral, resp. rate 20, height 5' 8"  (1.727 m), weight 70.8 kg (156 lb), SpO2 97 %.  Heart:     Regular rate and rhythm  Lungs:    Rhonchi  Capillary Refill:   <2 sec  Peripheral Pulse:   Radial pulse palpable  Skin:     Normal Color and Dry    Urine negative. Repeat lactic normal. Spoke with medicine, will admit for further evaluation and treatment. Family updated. Agree with the plan.   Vitals:  12/19/17 1451 12/19/17 1500 12/19/17 1530 12/19/17 1630  BP:  (!) 97/45 (!) 97/48 (!) 94/40  Pulse:  80 78 70  Resp:  (!) 23 (!) 23 20  Temp: 100.2 F (37.9 C)     TempSrc: Oral     SpO2:  93% 94% 97%  Weight:      Height:         Final Clinical Impressions(s) / ED Diagnoses   Final diagnoses:  Sepsis, due to unspecified organism [facility])    ED Discharge Orders    None         Jeannett Senior, PA-C 12/19/17 1759    Noemi Chapel, MD 12/19/17 1819

## 2017-12-19 NOTE — ED Notes (Signed)
Interrogation of pacemaker being completed.

## 2017-12-19 NOTE — ED Notes (Signed)
Device is normal. No episodes noted. NSR. Antonio Ray

## 2017-12-19 NOTE — Telephone Encounter (Signed)
OK 

## 2017-12-19 NOTE — Telephone Encounter (Signed)
Noted by triage.  °Will close encounter.  °

## 2017-12-19 NOTE — H&P (Signed)
History and Physical    Antonio Ray:[id number] DOB: 13-Sep-1933 DOA: 12/19/2017   PCP: [name], MD   Patient coming from:  Home    Chief Complaint: Generalized weakness  HPI: Antonio Ray is a 84 y.o. male with medical history significant for CAD, COPD, CHF, history of pneumonia with several aspiration pneumonia admissions, and into the ED complaining of generalized weakness, son reporting "did not feel like going for an usual walk today ".  Reported chills this morning, and stated that the symptoms are similar to that of prior hospitalizations.  Of note, the patient had been intubated in the past, he denies any chest pain or palpitations.  He denies any productive cough.  He denies any abdominal pain nausea or vomiting.  She has achalasia without any choking episodes at this time.  No diarrhea, no constipation.  He denies any dysuria or gross hematuria.  His appetite is decreased.  He denies any lower extremity edema.  ED Course:  BP (!) 94/40   Pulse 70   Temp 100.2 F (37.9 C) (Oral)   Resp 20   Ht _0  (1.727 m)   Wt 70.8 kg (156 lb)   SpO2 97%   BMI 23.72 kg/m    On presentation his lactic acid was 2.41, with repeat 1.32, his blood pressure was 99/45.  Son reports that he is diastolic usually runs in the low 60s.  He received 500 cc of saline, with some improvement of the diastolic blood pressure.  In addition, he was tachypneic, and his T-max was 100.2.  His respiratory rate was 24.  Code sepsis was called, and he was initiated antibiotics with IV Vanco and Zosyn.  Charts were drawn.  UA is negative. Blood and urine culture pending Chest x-ray negative for pneumonia or any other opacities or findings Creatinine 2.11, in view of decreased p.o. Intake White count 19.8, hemoglobin 12.6, platelets 195. 2D echo was in March 2018, EF 20 to 25%, severely reduced systolic, grade 2 diastolic  Review of Systems:  As per HPI otherwise all other systems reviewed and are  negative  Past Medical History:  Diagnosis Date  . Achalasia   . AICD (automatic cardioverter/defibrillator) present 03/30/2011   Analyze ST study patient  . Anginal pain ([place])   . BPH (benign prostatic hyperplasia)   . CHF (congestive heart failure) ([place])   . Chronic systolic dysfunction of left ventricle    EF 30-35%, CLASS II - III SYMPTOMS; intolerant to Coreg  . CKD (chronic kidney disease) stage 3, GFR 30-59 ml/min ([name]) 06/08/2012  . COPD (chronic obstructive pulmonary disease) ([place])   . Coronary artery disease    History of remote anterior MI with PCI to LAD in 2006; most recent cath 2007, no intervention required  . DOE (dyspnea on exertion)    with heavy exertion  . Dysrhythmia   . Esophageal dysmotility   . GERD (gastroesophageal reflux disease)   . GERD (gastroesophageal reflux disease)   . Head injury, closed, with concussion 2000ish  . Heart murmur    hx  . HOH (hard of hearing)   . Hyperlipidemia   . Memory loss    improved  . Myocardial infarction ([place]) 1994   ANTERIOR  . Pneumonia "several times"   aspiration pna with at least 3 admits for this 2016.   Marland Kitchen PVC's (premature ventricular contractions)   . Renal failure   . Skin cancer "several"   "forearms; head"  . Type II  diabetes mellitus ([place])     Past Surgical History:  Procedure Laterality Date  . BALLOON DILATION N/A 09/09/2015   Procedure: BALLOON DILATION;  Surgeon: Mauri Pole, MD;  Location: [facility];  Service: Endoscopy;  Laterality: N/A;  rigiflex achalasia balloon dilators  . BOTOX INJECTION N/A 04/08/2015   Procedure: BOTOX INJECTION;  Surgeon: Milus Banister, MD;  Location: [facility];  Service: Endoscopy;  Laterality: N/A;  . CARDIAC CATHETERIZATION  01/11/2006   DEMONSTRATES AKINESIA OF THE DISTAL ANTERIOR WALL, DISTAL INFERIOR WALL AND AKINESIA OF THE APEX. THE BASAL SEGMENTS CONTRACT WELL AND OVERALL EF 35%  . CATARACT EXTRACTION W/ INTRAOCULAR LENS  IMPLANT, BILATERAL   08/2014-09/2014  . CHOLECYSTECTOMY  2/11  . CORONARY ANGIOPLASTY  1994   TO THE LAD  . ESOPHAGEAL MANOMETRY N/A 03/16/2015   Procedure: ESOPHAGEAL MANOMETRY (EM);  Surgeon: Jerene Bears, MD;  Location: [facility];  Service: Gastroenterology;  Laterality: N/A;  . ESOPHAGOGASTRODUODENOSCOPY N/A 04/08/2015   Procedure: ESOPHAGOGASTRODUODENOSCOPY (EGD);  Surgeon: Milus Banister, MD;  Location: [facility];  Service: Endoscopy;  Laterality: N/A;  . ESOPHAGOGASTRODUODENOSCOPY (EGD) WITH PROPOFOL N/A 09/09/2015   Procedure: ESOPHAGOGASTRODUODENOSCOPY (EGD) WITH PROPOFOL;  Surgeon: Mauri Pole, MD;  Location: [facility];  Service: Endoscopy;  Laterality: N/A;  pt. is to have gastrografin xray post recovery-do not discharge until results are back  . EYE SURGERY    . FOOT FRACTURE SURGERY Right 1980's  . INSERT / REPLACE / REMOVE PACEMAKER    . SKIN CANCER EXCISION  "several"   "forearms, head"    Social History Social History   Socioeconomic History  . Marital status: Widowed    Spouse name: Not on file  . Number of children: 2  . Years of education: GED  . Highest education level: Not on file  Occupational History  . Occupation: Clinical biochemist: [employer]    Comment: Retired  . Occupation: [employer]    Employer: [employer]    Comment: Retired  Scientific laboratory technician  . Financial resource strain: Patient refused  . Food insecurity:    Worry: Never true    Inability: Never true  . Transportation needs:    Medical: No    Non-medical: No  Tobacco Use  . Smoking status: Former Smoker    Packs/day: 1.00    Years: 35.00    Pack years: 35.00    Types: Cigarettes    Last attempt to quit: 08/01/1992    Years since quitting: 25.4  . Smokeless tobacco: Former Systems developer    Quit date: 1994  Substance and Sexual Activity  . Alcohol use: No    Alcohol/week: 0.0 oz  . Drug use: No  . Sexual activity: Not Currently  Lifestyle  . Physical activity:    Days per week: Patient  refused    Minutes per session: Patient refused  . Stress: Patient refused  Relationships  . Social connections:    Talks on phone: Not on file    Gets together: Not on file    Attends religious service: Not on file    Active member of club or organization: Not on file    Attends meetings of clubs or organizations: Not on file    Relationship status: Not on file  . Intimate partner violence:    Fear of current or ex partner: Not on file    Emotionally abused: Not on file    Physically abused: Not on file  Forced sexual activity: Not on file  Other Topics Concern  . Not on file  Social History Narrative   Pt lives in [place] alone.  Widowed.   Right-handed.   Rare caffeine use - maybe one cup per month.     Allergies  Allergen Reactions  . Codeine Other (See Comments)    Gets very angry, disoriented  . Dilaudid [Hydromorphone Hcl] Other (See Comments)    VERY AGITATED, HOSTILE  . Flomax [Tamsulosin Hcl] Shortness Of Breath  . Morphine And Related Other (See Comments)    VERY AGITATED, HOSTILE  . Sulfa Antibiotics Shortness Of Breath  . Beta Adrenergic Blockers Other (See Comments)    Disorientation  . Carvedilol Other (See Comments)    DISORIENTATION    Family History  Problem Relation Age of Onset  . Stroke Father        Mother history unknown - never met her  . Colon cancer Neg Hx       Prior to Admission medications   Medication Sig Start Date End Date Taking? Authorizing Provider  albuterol (PROVENTIL HFA;VENTOLIN HFA) 108 (90 Base) MCG/ACT inhaler Inhale 2 puffs into the lungs every 6 (six) hours as needed for wheezing or shortness of breath. 11/16/17   Juanito Doom, MD  aspirin 81 MG EC tablet Take 81 mg by mouth daily.      [provider]  Cholecalciferol (VITAMIN D) 2000 units tablet Take 2,000 Units by mouth daily.     [provider]  donepezil (ARICEPT) 10 MG tablet Take 1 tablet (10 mg total) by mouth every morning. 05/01/17    Marcial Pacas, MD  fenofibrate (TRICOR) 145 MG tablet TAKE 1 TABLET BY MOUTH EVERY DAY Patient taking differently: Take 145 mg by mouth once a day 02/10/17   Martinique, Peter M, MD  finasteride (PROSCAR) 5 MG tablet Take 5 mg by mouth daily. 05/19/17   [provider]  furosemide (LASIX) 20 MG tablet Take 1 tablet (20 mg total) by mouth daily. 10/16/17   Regalado, Belkys A, MD  Insulin Glargine (BASAGLAR KWIKPEN) 100 UNIT/ML SOPN INJECT 5 UNITS UNDER THE SKIN EVERY MORNING 10/16/17   Regalado, Belkys A, MD  loratadine (CLARITIN) 10 MG tablet Take 1 tablet (10 mg total) by mouth daily. 07/02/15   Barton Dubois, MD  memantine (NAMENDA) 10 MG tablet Take 1 tablet (10 mg total) by mouth 2 (two) times daily. 05/01/17   Marcial Pacas, MD  Multiple Vitamins-Minerals (PRESERVISION AREDS 2) CAPS Take 1 capsule by mouth 2 (two) times daily.    [provider]  nitroGLYCERIN (NITRODUR - DOSED IN MG/24 HR) 0.2 mg/hr patch Place 1 patch (0.2 mg total) onto the skin daily. Alternates patch placement with odd/even days. 11/22/17   Martinique, Peter M, MD  pantoprazole (PROTONIX) 40 MG tablet Take 1 tablet (40 mg total) by mouth 2 (two) times daily. 10/16/17   Regalado, Belkys A, MD  pantoprazole (PROTONIX) 40 MG tablet Take 1 tablet (40 mg total) by mouth daily. 10/23/17   Mauri Pole, MD  pravastatin (PRAVACHOL) 40 MG tablet Take 1 tablet (40 mg total) by mouth at bedtime. 05/26/17   [place]ique, Peter M, MD  SPIRIVA HANDIHALER 18 MCG inhalation capsule INHALE CONTENTS OF 1 CAPSULE VIA HANDIHALER ONCE DAILY 06/08/17   Juanito Doom, MD  SYMBICORT 160-4.5 MCG/ACT inhaler INHALE 2 PUFFS BY MOUTH TWICE DAILY Patient taking differently: Inhale 2 puffs into the lungs two times a day 07/31/17   Simonne Maffucci  B, MD    Physical Exam:  Vitals:   12/19/17 1451 12/19/17 1500 12/19/17 1530 12/19/17 1630  BP:  (!) 97/45 (!) 97/48 (!) 94/40  Pulse:  80 78 70  Resp:  (!) 23 (!) 23 20  Temp: 100.2 F (37.9 C)      TempSrc: Oral     SpO2:  93% 94% 97%  Weight:      Height:       Constitutional: NAD, calm, comfortable, although chronically ill appearing.  No confusion is noted. Eyes: PERRL, lids and conjunctivae normal ENMT: Mucous membranes are moist, without exudate or lesions  Neck: normal, supple, no masses, no thyromegaly Respiratory: clear to auscultation bilaterally, no wheezing, no crackles. Normal respiratory effort  Cardiovascular: Regular rate and rhythm, 2 out of 6 murmur, rubs or gallops. No extremity edema. 2+ pedal pulses. No carotid bruits.  Abdomen: Soft, non tender, No hepatosplenomegaly. Bowel sounds positive.  Musculoskeletal: no clubbing / cyanosis. Moves all extremities Skin: no jaundice, No lesions.  Neurologic: Sensation intact  Strength equal in all extremities Psychiatric:   Alert and oriented x 3. Normal mood.     Labs on Admission: I have personally reviewed following labs and imaging studies  CBC: Recent Labs  Lab 12/19/17 1431  WBC 19.8*  HGB 12.6*  HCT 41.1  MCV 90.1  PLT 503    Basic Metabolic Panel: Recent Labs  Lab 12/19/17 1431  NA 141  K 4.3  CL 104  CO2 28  GLUCOSE 146*  BUN 38*  CREATININE 2.11*  CALCIUM 9.0    GFR: Estimated Creatinine Clearance: 26.1 mL/min (A) (by C-G formula based on SCr of 2.11 mg/dL (H)).  Liver Function Tests: No results for input(s): AST, ALT, ALKPHOS, BILITOT, PROT, ALBUMIN in the last 168 hours. No results for input(s): LIPASE, AMYLASE in the last 168 hours. No results for input(s): AMMONIA in the last 168 hours.  Coagulation Profile: No results for input(s): INR, PROTIME in the last 168 hours.  Cardiac Enzymes: No results for input(s): CKTOTAL, CKMB, CKMBINDEX, TROPONINI in the last 168 hours.  BNP (last 3 results) No results for input(s): PROBNP in the last 8760 hours.  HbA1C: No results for input(s): HGBA1C in the last 72 hours.  CBG: Recent Labs  Lab 12/19/17 1455  GLUCAP 138*     Lipid Profile: No results for input(s): CHOL, HDL, LDLCALC, TRIG, CHOLHDL, LDLDIRECT in the last 72 hours.  Thyroid Function Tests: No results for input(s): TSH, T4TOTAL, FREET4, T3FREE, THYROIDAB in the last 72 hours.  Anemia Panel: No results for input(s): VITAMINB12, FOLATE, FERRITIN, TIBC, IRON, RETICCTPCT in the last 72 hours.  Urine analysis:    Component Value Date/Time   COLORURINE YELLOW 12/19/2017 [place] 12/19/2017 1605   LABSPEC 1.015 12/19/2017 1605   PHURINE 5.0 12/19/2017 [place] 12/19/2017 1605   HGBUR NEGATIVE 12/19/2017 [place] 12/19/2017 1605   BILIRUBINUR neg 06/06/2012 [place] 12/19/2017 1605   PROTEINUR NEGATIVE 12/19/2017 1605   UROBILINOGEN 0.2 05/12/2015 0920   NITRITE NEGATIVE 12/19/2017 1605   LEUKOCYTESUR NEGATIVE 12/19/2017 1605    Sepsis Labs: _0 (procalcitonin:4,lacticidven:4) ) Recent Results (from the past 240 hour(s))  Blood culture (routine x 2)     Status: None (Preliminary result)   Collection Time: 12/19/17  3:19 PM  Result Value Ref Range Status   Specimen Description BLOOD LEFT HAND  Final   Special Requests   Final  BOTTLES DRAWN AEROBIC AND ANAEROBIC Blood Culture adequate volume   Culture PENDING  Incomplete   Report Status PENDING  Incomplete     Radiological Exams on Admission: Dg Chest 2 View  Result Date: 12/19/2017 CLINICAL DATA:  Generalized weakness and fever. EXAM: CHEST - 2 VIEW COMPARISON:  10/14/2017. FINDINGS: Unchanged cardiomediastinal silhouette. Dual lead pacer is stable. Upper lobe RIGHT greater than LEFT pulmonary opacities do not appear to have progressed. No consolidation or edema. No effusion or pneumothorax. IMPRESSION: Stable chest. Electronically Signed   By: Staci Righter M.D.   On: 12/19/2017 15:36    EKG: Independently reviewed.  Assessment/Plan Active Problems:   Sepsis ([place])   Pneumonia, aspiration ([place])    Coronary artery disease   S/P ICD (internal cardiac defibrillator) procedure   COPD (chronic obstructive pulmonary disease) ([name])   BPH (benign prostatic hyperplasia)   GERD (gastroesophageal reflux disease)   Leukocytosis   Esophageal dysphagia   Aspiration into airway   Chronic kidney disease, stage III (moderate) ([name])   Type 2 diabetes mellitus with stage 3 chronic kidney disease, with long-term current use of insulin ([name])   Chronic systolic congestive heart failure ([name])   Chronic systolic dysfunction of left ventricle   Generalized weakness  Presumed sepsis versus SIRS, in the setting of leukocytosis, fever, and tachycardia, borderline hypotension on presentation, as well as evidence of acute organ failure, with lactic acid initially at 2.4, now normal at 1.32.  Prior history of aspiration pneumonia overall, after initiation of a bolus of 500 cc IV fluids, and Vanco and Zosyn, he is overall status began to improve.  Blood and urine cultures are pending.   Will place the patient in observation status, with telemetry, to continue to monitor.  Continue Vanco and Zosyn for now, for suspected aspiration pneumonia. Follow cultures CBC and be met in the morning  Procalcitonin level confirmed or rule out sepsis Continue to trend lactate He received enough IV fluids, and now he can tolerate by mouth, will refrain from further IV fluids due to very low ejection fraction and grade 2 diastolic.  COPD, with possible exacerbation, chest x-ray nondiagnostic for pneumonia.  No wheezing on exam  Duonebs and Albuterol as needed steroids  Continue Spiriva and Dulera  O2  CBC in am Incentive spirometry Son is friends with pulmonologist, and has calling to visit the patient and possibly consult to help with the patient optimization of his COPD   Generalized weakness, in the setting of above.  Patient is usually very active, walks several miles a day, but today was unable to do so due to symptoms.     Will obtain PT and OT have the patient mobilized  Chronic kidney disease stage reddening slightly elevated at 2.11, normally around 1.7-1.8.  This is likely due to decreased oral intake.  He received 1 bolus of 500 cc of IV fluids, and is now beginning to feel better, and tolerating p.o. Lab Results  Component Value Date   CREATININE 2.11 (H) 12/19/2017   CREATININE 1.76 (H) 10/15/2017   CREATININE 1.94 (H) 10/14/2017  Hold diuretics Repeat BMET in am  Hold NSAIDS  Chronic combined systolic and diastolic CHF  CXR without signs of acute on chronic. heart failure  2D echo was in March 2018, EF 20 to 25%, severely reduced systolic, grade 2 diastolic monitor I/Os and daily weights prn 02 Careful administration of IV fluids, due to severe CHF  Hypertension BP 94/40   Pulse 70   Hold any  hypertensives, in view of low blood pressure.   Hyperlipidemia Continue home statins  History of Dementia Continue Aricept    Benign prostatic hypertrophy, no acute issues Continue Proscar   DVT prophylaxis: Heparin subcu Code Status:    Full Family Communication:  Discussed with patient Disposition Plan: Expect patient to be discharged to home after condition improves Consults called:    None Admission status:    Sharene Butters, PA-C [facility]   Amion text  [phone]   12/19/2017, 6:23 PM

## 2017-12-19 NOTE — Telephone Encounter (Signed)
Called and spoke to pt's son, [name]. [name] stated that pt has been transported to [facility] for temp of 100.2, light headed & non prod cough.  EMS stated glucose levels were normal, however lungs sounded a little diminished.  Per Resa Miner was 92% on room After giving albuterol neb treatment spO2 increased to 94%.  [name] wanted to make BQ aware that pt is currently in room E43 in ED.

## 2017-12-19 NOTE — ED Triage Notes (Signed)
EMS- Pt with c/o dizziness and low grade fever at home. Lungs sounds were diminished. Hx of MI, COPD. Denies SOB or Chx pain  Given 1000 mg tylenol, 0.5 albuterol in route. A/O

## 2017-12-19 NOTE — ED Notes (Signed)
Patient transported to X-ray 

## 2017-12-19 NOTE — ED Notes (Signed)
Heart healthy dinner tray ordered 

## 2017-12-20 ENCOUNTER — Other Ambulatory Visit: Payer: Self-pay

## 2017-12-20 ENCOUNTER — Encounter (HOSPITAL_COMMUNITY): Payer: Self-pay | Admitting: *Deleted

## 2017-12-20 DIAGNOSIS — I519 Heart disease, unspecified: Secondary | ICD-10-CM

## 2017-12-20 LAB — CBC
HCT: 36.7 % — ABNORMAL LOW (ref 39.0–52.0)
HEMOGLOBIN: 11.1 g/dL — AB (ref 13.0–17.0)
MCH: 27.3 pg (ref 26.0–34.0)
MCHC: 30.2 g/dL (ref 30.0–36.0)
MCV: 90.2 fL (ref 78.0–100.0)
PLATELETS: 174 10*3/uL (ref 150–400)
RBC: 4.07 MIL/uL — ABNORMAL LOW (ref 4.22–5.81)
RDW: 16 % — AB (ref 11.5–15.5)
WBC: 12 10*3/uL — ABNORMAL HIGH (ref 4.0–10.5)

## 2017-12-20 LAB — GLUCOSE, CAPILLARY
GLUCOSE-CAPILLARY: 148 mg/dL — AB (ref 65–99)
GLUCOSE-CAPILLARY: 101 mg/dL — AB (ref 65–99)
GLUCOSE-CAPILLARY: 98 mg/dL (ref 65–99)
Glucose-Capillary: 138 mg/dL — ABNORMAL HIGH (ref 65–99)
Glucose-Capillary: 121 mg/dL — ABNORMAL HIGH (ref 65–99)

## 2017-12-20 LAB — BASIC METABOLIC PANEL
ANION GAP: 8 (ref 5–15)
BUN: 34 mg/dL — ABNORMAL HIGH (ref 6–20)
CALCIUM: 8.3 mg/dL — AB (ref 8.9–10.3)
CO2: 26 mmol/L (ref 22–32)
CREATININE: 1.87 mg/dL — AB (ref 0.61–1.24)
Chloride: 109 mmol/L (ref 101–111)
GFR calc Af Amer: 37 mL/min — ABNORMAL LOW (ref >60)
GFR, EST NON AFRICAN AMERICAN: 32 mL/min — AB (ref >60)
Glucose, Bld: 113 mg/dL — ABNORMAL HIGH (ref 65–99)
Potassium: 3.7 mmol/L (ref 3.5–5.1)
Sodium: 143 mmol/L (ref 135–145)

## 2017-12-20 LAB — PROCALCITONIN: PROCALCITONIN: 0.65 ng/mL

## 2017-12-20 LAB — URINE CULTURE: CULTURE: NO GROWTH

## 2017-12-20 MED ORDER — FUROSEMIDE 40 MG PO TABS
20.0000 mg | ORAL_TABLET | Freq: Every day | ORAL | Status: DC
  Administered 2017-12-20 – 2017-12-22 (×3): 20 mg via ORAL
  Filled 2017-12-20 (×3): qty 1

## 2017-12-20 NOTE — ED Notes (Signed)
ED TO INPATIENT HANDOFF REPORT  Name/Age/Gender Antonio Ray 84 y.o. male  Code Status    Code Status Orders  (From admission, onward)        Start     Ordered   12/19/17 1715  Full code  Continuous     12/19/17 1717    Code Status History    Date Active Date Inactive Code Status Order ID Comments User Context   10/13/2017 1649 10/16/2017 2125 Full Code [id number]  Rondel Jumbo PA-C ED   08/19/2017 1234 08/22/2017 1950 Full Code [id number]  MoltRomelle Starcher, DO ED   06/12/2017 2123 06/14/2017 1915 Full Code [id number]  Burgess Estelle, MD Inpatient   02/16/2017 0244 02/17/2017 2151 Full Code [id number]  Nicolette Bang, DO Inpatient   02/02/2017 1230 02/03/2017 1758 Full Code [id number]  Verner Mould, MD ED   11/11/2016 1514 11/14/2016 1706 Full Code [id number]  Mayo, Pete Pelt, MD ED   09/22/2016 0125 09/22/2016 2152 Full Code [id number]  Etta Quill, DO ED   04/21/2016 1346 04/22/2016 2103 Full Code [id number]  Waldemar Dickens, MD ED   06/30/2015 0303 07/02/2015 1927 Full Code [id number]  Toy Baker, MD Inpatient   05/12/2015 1423 05/15/2015 2118 Full Code [id number]  Mariel Aloe, MD Inpatient   03/31/2015 1642 04/11/2015 1835 Full Code [id number]  Barton Dubois, MD Inpatient   03/21/2015 1406 03/24/2015 2128 Full Code [id number]  Kelvin Cellar, MD Inpatient   02/07/2015 1643 02/10/2015 2216 Full Code [id number]  Elmarie Shiley, MD Inpatient   01/14/2015 0158 01/15/2015 2124 Full Code [id number]  Lavina Hamman, MD Inpatient   09/10/2013 2349 09/15/2013 1830 Full Code [id number]  Rise Patience, MD Inpatient   07/17/2011 1449 07/19/2011 1750 Full Code [id number]  Cliffton Asters, RN Inpatient   06/18/2011 1550 06/13/2011 2050 Full Code [id number]  Kalman Drape, RN Inpatient      Home/SNF/Other Home  Chief Complaint fever/weakness  Level of Care/Admitting Diagnosis ED Disposition    ED Disposition Condition [place]: [facility] [100100]  Level of Care: Med-Surg [16]  Diagnosis: Generalized weakness [115726]  Admitting Physician: Patrecia Pour [phone]  Attending Physician: Patrecia Pour [phone]  Estimated length of stay: past midnight tomorrow  Certification:: I certify this patient will need inpatient services for at least 2 midnights  PT Class (Do Not Modify): Inpatient [101]  PT Acc Code (Do Not Modify): Private [1]       Medical History Past Medical History:  Diagnosis Date  . Achalasia   . AICD (automatic cardioverter/defibrillator) present 03/30/2011   Analyze ST study patient  . Anginal pain ([place])   . BPH (benign prostatic hyperplasia)   . CHF (congestive heart failure) ([place])   . Chronic systolic dysfunction of left ventricle    EF 30-35%, CLASS II - III SYMPTOMS; intolerant to Coreg  . CKD (chronic kidney disease) stage 3, GFR 30-59 ml/min ([name]) 06/08/2012  . COPD (chronic obstructive pulmonary disease) ([place])   . Coronary artery disease    History of remote anterior MI with PCI to LAD in 2006; most recent cath 2007, no intervention required  . DOE (dyspnea on exertion)    with heavy exertion  . Dysrhythmia   . Esophageal dysmotility   . GERD (gastroesophageal reflux disease)   . GERD (gastroesophageal reflux disease)   . Head injury, closed, with concussion 2000ish  . Heart murmur    hx  .  HOH (hard of hearing)   . Hyperlipidemia   . Memory loss    improved  . Myocardial infarction ([place]) 1994   ANTERIOR  . Pneumonia "several times"   aspiration pna with at least 3 admits for this 2016.   Marland Kitchen PVC's (premature ventricular contractions)   . Renal failure   . Skin cancer "several"   "forearms; head"  . Type II diabetes mellitus ([name])     Allergies Allergies  Allergen Reactions  . Codeine Other (See Comments)    Gets very angry, disoriented  . Dilaudid [Hydromorphone Hcl] Other (See Comments)    VERY AGITATED, HOSTILE  . Flomax [Tamsulosin Hcl] Shortness Of Breath  .  Morphine And Related Other (See Comments)    VERY AGITATED, HOSTILE  . Sulfa Antibiotics Shortness Of Breath  . Beta Adrenergic Blockers Other (See Comments)    Disorientation  . Carvedilol Other (See Comments)    DISORIENTATION    IV Location/Drains/Wounds Patient Lines/Drains/Airways Status   Active Line/Drains/Airways    Name:   Placement date:   Placement time:   Site:   Days:   Peripheral IV 12/19/17 Left Forearm   12/19/17    1406    Forearm   1          Labs/Imaging Results for orders placed or performed during the hospital encounter of 12/19/17 (from the past 48 hour(s))  Basic metabolic panel     Status: Abnormal   Collection Time: 12/19/17  2:31 PM  Result Value Ref Range   Sodium 141 135 - 145 mmol/L   Potassium 4.3 3.5 - 5.1 mmol/L   Chloride 104 101 - 111 mmol/L   CO2 28 22 - 32 mmol/L   Glucose, Bld 146 (H) 65 - 99 mg/dL   BUN 38 (H) 6 - 20 mg/dL   Creatinine, Ser 2.11 (H) 0.61 - 1.24 mg/dL   Calcium 9.0 8.9 - 10.3 mg/dL   GFR calc non Af Amer 28 (L) >60 mL/min   GFR calc Af Amer 32 (L) >60 mL/min    Comment: (NOTE) The eGFR has been calculated using the CKD EPI equation. This calculation has not been validated in all clinical situations. eGFR's persistently <60 mL/min signify possible Chronic Kidney Disease.    Anion gap 9 5 - 15    Comment: Performed at [facility] [address] [place], [place] [address]  CBC     Status: Abnormal   Collection Time: 12/19/17  2:31 PM  Result Value Ref Range   WBC 19.8 (H) 4.0 - 10.5 K/uL   RBC 4.56 4.22 - 5.81 MIL/uL   Hemoglobin 12.6 (L) 13.0 - 17.0 g/dL   HCT 41.1 39.0 - 52.0 %   MCV 90.1 78.0 - 100.0 fL   MCH 27.6 26.0 - 34.0 pg   MCHC 30.7 30.0 - 36.0 g/dL   RDW 15.7 (H) 11.5 - 15.5 %   Platelets 195 150 - 400 K/uL    Comment: Performed at [facility] [address], [place], [place] [address]  I-Stat Troponin, ED (not at [facility])     Status: None   Collection Time: 12/19/17  2:39 PM  Result  Value Ref Range   Troponin i, poc 0.03 0.00 - 0.08 ng/mL   Comment 3            Comment: Due to the release kinetics of cTnI, a negative result within the first hours of the onset of symptoms does not rule out  myocardial infarction with certainty. If myocardial infarction is still suspected, repeat the test at appropriate intervals.   I-Stat CG4 Lactic Acid, ED     Status: Abnormal   Collection Time: 12/19/17  2:41 PM  Result Value Ref Range   Lactic Acid, Venous 2.41 (HH) 0.5 - 1.9 mmol/L   Comment NOTIFIED PHYSICIAN   CBG monitoring, ED     Status: Abnormal   Collection Time: 12/19/17  2:55 PM  Result Value Ref Range   Glucose-Capillary 138 (H) 65 - 99 mg/dL  Blood culture (routine x 2)     Status: None (Preliminary result)   Collection Time: 12/19/17  3:19 PM  Result Value Ref Range   Specimen Description BLOOD LEFT HAND    Special Requests      BOTTLES DRAWN AEROBIC AND ANAEROBIC Blood Culture adequate volume   Culture PENDING    Report Status PENDING   Urinalysis, Routine w reflex microscopic     Status: None   Collection Time: 12/19/17  4:05 PM  Result Value Ref Range   Color, Urine YELLOW YELLOW   APPearance CLEAR CLEAR   Specific Gravity, Urine 1.015 1.005 - 1.030   pH 5.0 5.0 - 8.0   Glucose, UA NEGATIVE NEGATIVE mg/dL   Hgb urine dipstick NEGATIVE NEGATIVE   Bilirubin Urine NEGATIVE NEGATIVE   Ketones, ur NEGATIVE NEGATIVE mg/dL   Protein, ur NEGATIVE NEGATIVE mg/dL   Nitrite NEGATIVE NEGATIVE   Leukocytes, UA NEGATIVE NEGATIVE    Comment: Performed at [facility], [address] [place], [place] [address]  I-Stat CG4 Lactic Acid, ED     Status: None   Collection Time: 12/19/17  4:40 PM  Result Value Ref Range   Lactic Acid, Venous 1.32 0.5 - 1.9 mmol/L   Dg Chest 2 View  Result Date: 12/19/2017 CLINICAL DATA:  Generalized weakness and fever. EXAM: CHEST - 2 VIEW COMPARISON:  10/14/2017. FINDINGS: Unchanged cardiomediastinal silhouette. Dual lead  pacer is stable. Upper lobe RIGHT greater than LEFT pulmonary opacities do not appear to have progressed. No consolidation or edema. No effusion or pneumothorax. IMPRESSION: Stable chest. Electronically Signed   By: Staci Righter M.D.   On: 12/19/2017 15:36    Pending Labs Unresulted Labs (From admission, onward)   Start     Ordered   12/20/17 6045  Basic metabolic panel  Tomorrow morning,   R     12/19/17 1717   12/20/17 0500  CBC  Tomorrow morning,   R     12/19/17 1717   12/20/17 0000  Culture, sputum-assessment  STAT,   R     12/19/17 1717   12/19/17 1712  Procalcitonin  STAT,   R     12/19/17 1716   12/19/17 1508  Urine culture  STAT,   STAT     12/19/17 1508   12/19/17 1454  Blood culture (routine x 2)  BLOOD CULTURE X 2,   STAT     12/19/17 1453      Vitals/Pain Today's Vitals   12/20/17 0000 12/20/17 0036 12/20/17 0100 12/20/17 0116  BP: (!) 95/57  (!) 107/50   Pulse: (!) 51  62   Resp: (!) 21  17   Temp:      TempSrc:      SpO2: 92%  98%   Weight:      Height:      PainSc:  0-No pain  Asleep    Isolation Precautions No active isolations  Medications Medications  piperacillin-tazobactam (ZOSYN) IVPB 3.375 g (3.375 g Intravenous New Bag/Given 12/19/17 2246)  vancomycin (VANCOCIN) IVPB 750 mg/150 ml premix (has no administration in time range)  albuterol (PROVENTIL) (2.5 MG/3ML) 0.083% nebulizer solution 3 mL (has no administration in time range)  aspirin EC tablet 81 mg (has no administration in time range)  cholecalciferol (VITAMIN D) tablet 2,000 Units (has no administration in time range)  donepezil (ARICEPT) tablet 10 mg (has no administration in time range)  finasteride (PROSCAR) tablet 5 mg (has no administration in time range)  memantine (NAMENDA) tablet 10 mg (10 mg Oral Given 12/19/17 2243)  pantoprazole (PROTONIX) EC tablet 40 mg (40 mg Oral Given 12/19/17 2243)  pravastatin (PRAVACHOL) tablet 40 mg (40 mg Oral Given 12/19/17 2243)  tiotropium (SPIRIVA)  inhalation capsule 18 mcg (has no administration in time range)  mometasone-formoterol (DULERA) 200-5 MCG/ACT inhaler 2 puff (2 puffs Inhalation Not Given 12/19/17 2013)  acetaminophen (TYLENOL) tablet 650 mg (has no administration in time range)    Or  acetaminophen (TYLENOL) suppository 650 mg (has no administration in time range)  senna-docusate (Senokot-S) tablet 1 tablet (has no administration in time range)  bisacodyl (DULCOLAX) suppository 10 mg (has no administration in time range)  ondansetron (ZOFRAN) tablet 4 mg (has no administration in time range)    Or  ondansetron (ZOFRAN) injection 4 mg (has no administration in time range)  insulin aspart (novoLOG) injection 0-9 Units (has no administration in time range)  heparin injection 5,000 Units (5,000 Units Subcutaneous Given 12/19/17 2244)  sodium chloride 0.9 % bolus 500 mL (0 mLs Intravenous Stopped 12/19/17 1600)  ipratropium-albuterol (DUONEB) 0.5-2.5 (3) MG/3ML nebulizer solution 3 mL (3 mLs Nebulization Given 12/19/17 1603)  piperacillin-tazobactam (ZOSYN) IVPB 3.375 g (0 g Intravenous Stopped 12/19/17 1630)  vancomycin (VANCOCIN) 1,500 mg in sodium chloride 0.9 % 500 mL IVPB (0 mg Intravenous Stopped 12/19/17 1808)  sodium chloride 0.9 % bolus 500 mL (0 mLs Intravenous Stopped 12/19/17 1956)    Mobility walks with device

## 2017-12-20 NOTE — Progress Notes (Signed)
   12/20/17 1500  Clinical Encounter Type  Visited With Patient  Visit Type Initial  Referral From Chaplain  Consult/Referral To Nurse  Spiritual Encounters  Spiritual Needs Emotional  Stress Factors  Patient Stress Factors Exhausted    Pt was sitting in his chair when I arrived. No family member in the room. Chaplain had a very meaningful conversation about pt's experiences. Chaplain provided compassionate empathic presence.  Rachel Rison a Medical sales representative, [employer]

## 2017-12-20 NOTE — Progress Notes (Signed)
PROGRESS NOTE        [name] Age: 84 y.o. Sex: male Date of Birth: 06/10/1934 Admit Date: 12/19/2017 Admitting Physician Patrecia Pour, MD MEQ:[id number], Jannifer Rodney, MD  Brief Narrative: Patient is a 84 y.o. male with prior history of achalasia, recurrent aspiration pneumonitis, chronic systolic heart failure, COPD chronic kidney disease stage III-presented with 1 day history of lethargy/confusion, chills and low-grade fever.  No foci of infection apparent on exam or by imaging-empirically started on vancomycin/Zosyn and admitted to the hospitalist service.  See below for further details  Subjective: Feels better-no further fever.  Denies any cough, shortness of breath, headache, neck pain, nausea, vomiting or diarrhea.  Denies any abdominal pain.  Denies any skin rash.  Assessment/Plan: Sepsis: No source evident-but clinically improved-await culture data.  Though he has a history of recurrent aspiration pneumonitis in a setting of achalasia-he has no infiltrate seen on chest x-ray.  Continue with  empiric Zosyn and vancomycin pending further culture data.  Chronic kidney disease stage III: Creatinine close to usual baseline.  Monitor electrolytes periodically.  Achalasia: Followed by gastroenterology-has any dysphagia to me-most recent esophagogram showed a potential esophageal stricture, since he seems to be apparently asymptomatic-we will defer to the outpatient setting once he has recovered from acute illness  COPD: Stable-continue with current usual inhaler/bronchodilator regimen  Chronic systolic heart failure (EF 20-25% by TTE on 10/15/2017): Reasonably well compensated-resume Lasix  Dyslipidemia: Continue statin and fenofibrate.  DM-2: CBG stable with SSI  Mild dementia/cognitive dysfunction: Awake and mostly alert during my exam this morning-continue Aricept and Namenda  DVT Prophylaxis: Prophylactic Heparin   Code  Status: Full code   Family Communication: Son over the phone  Disposition Plan: Remain inpatient-probably home in the next 2-3 days-may require home health services  Antimicrobial agents: Anti-infectives (From admission, onward)   Start     Dose/Rate Route Frequency Ordered Stop   12/20/17 0500  vancomycin (VANCOCIN) IVPB 750 mg/150 ml premix     750 mg 150 mL/hr over 60 Minutes Intravenous Every 12 hours 12/19/17 1518     12/19/17 2200  piperacillin-tazobactam (ZOSYN) IVPB 3.375 g     3.375 g 12.5 mL/hr over 240 Minutes Intravenous Every 8 hours 12/19/17 1518     12/19/17 1530  vancomycin (VANCOCIN) 1,500 mg in sodium chloride 0.9 % 500 mL IVPB     1,500 mg 250 mL/hr over 120 Minutes Intravenous  Once 12/19/17 1513 12/19/17 1808   12/19/17 1515  piperacillin-tazobactam (ZOSYN) IVPB 3.375 g     3.375 g 100 mL/hr over 30 Minutes Intravenous  Once 12/19/17 1508 12/19/17 1630   12/19/17 1515  vancomycin (VANCOCIN) IVPB 1000 mg/200 mL premix  Status:  Discontinued     1,000 mg 200 mL/hr over 60 Minutes Intravenous  Once 12/19/17 1508 12/19/17 1513      Procedures: None  CONSULTS:  None  Time spent: 25 minutes-Greater than 50% of this time was spent in counseling, explanation of diagnosis, planning of further management, and coordination of care.  MEDICATIONS: Scheduled Meds: . aspirin EC  81 mg Oral Daily  . cholecalciferol  2,000 Units Oral Daily  . donepezil  10 mg Oral q morning - 10a  . finasteride  5 mg Oral Daily  . heparin  5,000 Units Subcutaneous Q8H  . insulin aspart  0-9 Units  Subcutaneous TID WC  . memantine  10 mg Oral BID  . mometasone-formoterol  2 puff Inhalation BID  . pantoprazole  40 mg Oral BID  . pravastatin  40 mg Oral QHS  . tiotropium  18 mcg Inhalation Daily   Continuous Infusions: . piperacillin-tazobactam (ZOSYN)  IV 3.375 g (12/20/17 0609)  . vancomycin 750 mg (12/20/17 0609)   PRN Meds:.acetaminophen **OR** acetaminophen, albuterol,  bisacodyl, ondansetron **OR** ondansetron (ZOFRAN) IV, senna-docusate   PHYSICAL EXAM: Vital signs: Vitals:   12/20/17 0458 12/20/17 0819 12/20/17 0824 12/20/17 0921  BP: (!) 94/50   (!) 111/56  Pulse: 60 60  (!) 59  Resp: 16 18  18   Temp: (!) 97.5 F (36.4 C)   97.6 F (36.4 C)  TempSrc: Oral   Oral  SpO2: 99% 95% 95% 100%  Weight:      Height:       Filed Weights   12/19/17 1420 12/20/17 0213  Weight: 70.8 kg (156 lb) 72.8 kg (160 lb 6.4 oz)   Body mass index is 23.69 kg/m.   General appearance :Awake, alert, not in any distress.  HEENT: Atraumatic and Normocephalic Neck: supple Resp:Good air entry bilaterally, no added sounds  CVS: S1 S2 regular, no murmurs.  GI: Bowel sounds present, Non tender and not distended with no gaurding, rigidity or rebound.No organomegaly Extremities: B/L Lower Ext shows trace edema, both legs are warm to touch Neurology:  speech clear,Non focal, sensation is grossly intact. Psychiatric: Normal judgment and insight. Alert and oriented x 3. Normal mood. Musculoskeletal:No digital cyanosis Skin:No Rash, warm and dry Wounds:N/A  I have personally reviewed following labs and imaging studies  LABORATORY DATA: CBC: Recent Labs  Lab 12/19/17 1431 12/20/17 0351  WBC 19.8* 12.0*  HGB 12.6* 11.1*  HCT 41.1 36.7*  MCV 90.1 90.2  PLT 195 892    Basic Metabolic Panel: Recent Labs  Lab 12/19/17 1431 12/20/17 0351  NA 141 143  K 4.3 3.7  CL 104 109  CO2 28 26  GLUCOSE 146* 113*  BUN 38* 34*  CREATININE 2.11* 1.87*  CALCIUM 9.0 8.3*    GFR: Estimated Creatinine Clearance: 30.5 mL/min (A) (by C-G formula based on SCr of 1.87 mg/dL (H)).  Liver Function Tests: No results for input(s): AST, ALT, ALKPHOS, BILITOT, PROT, ALBUMIN in the last 168 hours. No results for input(s): LIPASE, AMYLASE in the last 168 hours. No results for input(s): AMMONIA in the last 168 hours.  Coagulation Profile: No results for input(s): INR, PROTIME in  the last 168 hours.  Cardiac Enzymes: No results for input(s): CKTOTAL, CKMB, CKMBINDEX, TROPONINI in the last 168 hours.  BNP (last 3 results) No results for input(s): PROBNP in the last 8760 hours.  HbA1C: No results for input(s): HGBA1C in the last 72 hours.  CBG: Recent Labs  Lab 12/19/17 1455 12/20/17 0219 12/20/17 0801 12/20/17 1147  GLUCAP 138* 148* 101* 138*    Lipid Profile: No results for input(s): CHOL, HDL, LDLCALC, TRIG, CHOLHDL, LDLDIRECT in the last 72 hours.  Thyroid Function Tests: No results for input(s): TSH, T4TOTAL, FREET4, T3FREE, THYROIDAB in the last 72 hours.  Anemia Panel: No results for input(s): VITAMINB12, FOLATE, FERRITIN, TIBC, IRON, RETICCTPCT in the last 72 hours.  Urine analysis:    Component Value Date/Time   COLORURINE YELLOW 12/19/2017 [place] 12/19/2017 1605   LABSPEC 1.015 12/19/2017 1605   PHURINE 5.0 12/19/2017 [place] 12/19/2017 1605   HGBUR NEGATIVE 12/19/2017  [place] 12/19/2017 1605   BILIRUBINUR neg 06/06/2012 1037   KETONESUR NEGATIVE 12/19/2017 1605   PROTEINUR NEGATIVE 12/19/2017 1605   UROBILINOGEN 0.2 05/12/2015 0920   NITRITE NEGATIVE 12/19/2017 1605   LEUKOCYTESUR NEGATIVE 12/19/2017 1605    Sepsis Labs: Lactic Acid, Venous    Component Value Date/Time   LATICACIDVEN 1.32 12/19/2017 1640    MICROBIOLOGY: Recent Results (from the past 240 hour(s))  Blood culture (routine x 2)     Status: None (Preliminary result)   Collection Time: 12/19/17  3:19 PM  Result Value Ref Range Status   Specimen Description BLOOD LEFT HAND  Final   Special Requests   Final    BOTTLES DRAWN AEROBIC AND ANAEROBIC Blood Culture adequate volume   Culture PENDING  Incomplete   Report Status PENDING  Incomplete    RADIOLOGY STUDIES/RESULTS: Dg Chest 2 View  Result Date: 12/19/2017 CLINICAL DATA:  Generalized weakness and fever. EXAM: CHEST - 2 VIEW COMPARISON:   10/14/2017. FINDINGS: Unchanged cardiomediastinal silhouette. Dual lead pacer is stable. Upper lobe RIGHT greater than LEFT pulmonary opacities do not appear to have progressed. No consolidation or edema. No effusion or pneumothorax. IMPRESSION: Stable chest. Electronically Signed   By: Staci Righter M.D.   On: 12/19/2017 15:36     LOS: 1 day   Oren Binet, MD  [facility]  If 7PM-7AM, please contact night-coverage  Please page via [email or web address]-click on MD name and type text message  12/20/2017, 12:47 PM

## 2017-12-20 NOTE — Care Management Note (Addendum)
Case Management Note  Patient Details  Name: Antonio Ray MRN: [id number] Date of Birth: [birth year]  Subjective/Objective:  History of recurrent aspiration pneumonia: due to severe and refractory achalasia, CAD, CHF, COPD, Stage III CKD;  Admitted for Pneumonia.  PCP noted.            Action/Plan: Pt's pulmonologist, Dr. [place] Bells is aware of admission.  Prior to admission patient lived at home.  Patient still drives, denies any transportation issues.  Patient does his shopping etc... Home DME: none.  At discharge patient plans to return to same living situation.  Patient able to afford medications/food.  Patient has never had to use home health agency in the past; would like to see list of available providers if required.  Physical therapy consult pending. NCM will continue to follow for discharge transition needs.   Expected Discharge Date:  12/21/17               Expected Discharge Plan:   Home self care  In-House Referral:   N/A  Discharge planning Services  CM Consult  Status of Service:  In process, will continue to follow  Kristen Cardinal, RN 12/20/2017, 11:54 AM

## 2017-12-21 LAB — BASIC METABOLIC PANEL
ANION GAP: 5 (ref 5–15)
BUN: 26 mg/dL — ABNORMAL HIGH (ref 6–20)
CHLORIDE: 110 mmol/L (ref 101–111)
CO2: 29 mmol/L (ref 22–32)
Calcium: 8.6 mg/dL — ABNORMAL LOW (ref 8.9–10.3)
Creatinine, Ser: 1.74 mg/dL — ABNORMAL HIGH (ref 0.61–1.24)
GFR calc non Af Amer: 35 mL/min — ABNORMAL LOW (ref >60)
GFR, EST AFRICAN AMERICAN: 40 mL/min — AB (ref >60)
Glucose, Bld: 110 mg/dL — ABNORMAL HIGH (ref 65–99)
POTASSIUM: 3.9 mmol/L (ref 3.5–5.1)
Sodium: 144 mmol/L (ref 135–145)

## 2017-12-21 LAB — EXPECTORATED SPUTUM ASSESSMENT W GRAM STAIN, RFLX TO RESP C

## 2017-12-21 LAB — CBC
HCT: 36.7 % — ABNORMAL LOW (ref 39.0–52.0)
HEMOGLOBIN: 11.2 g/dL — AB (ref 13.0–17.0)
MCH: 27.3 pg (ref 26.0–34.0)
MCHC: 30.5 g/dL (ref 30.0–36.0)
MCV: 89.5 fL (ref 78.0–100.0)
Platelets: 159 10*3/uL (ref 150–400)
RBC: 4.1 MIL/uL — AB (ref 4.22–5.81)
RDW: 16 % — ABNORMAL HIGH (ref 11.5–15.5)
WBC: 7.8 10*3/uL (ref 4.0–10.5)

## 2017-12-21 LAB — GLUCOSE, CAPILLARY
GLUCOSE-CAPILLARY: 108 mg/dL — AB (ref 65–99)
GLUCOSE-CAPILLARY: 140 mg/dL — AB (ref 65–99)
Glucose-Capillary: 124 mg/dL — ABNORMAL HIGH (ref 65–99)
Glucose-Capillary: 129 mg/dL — ABNORMAL HIGH (ref 65–99)

## 2017-12-21 LAB — EXPECTORATED SPUTUM ASSESSMENT W REFEX TO RESP CULTURE

## 2017-12-21 NOTE — Progress Notes (Signed)
PROGRESS NOTE        [name] Age: 84 y.o. Sex: male Date of Birth: [birth year] Admit Date: 12/19/2017 Admitting Physician Patrecia Pour, MD NWG:[id number], Jannifer Rodney, MD  Brief Narrative: Patient is a 84 y.o. male with prior history of achalasia, recurrent aspiration pneumonitis, chronic systolic heart failure, COPD chronic kidney disease stage III-presented with 1 day history of lethargy/confusion, chills and low-grade fever.  No foci of infection apparent on exam or by imaging-empirically started on vancomycin/Zosyn and admitted to the hospitalist service.  See below for further details  Subjective: Feels better-no fever since admission.  No chills as well.    Assessment/Plan: Sepsis: Sepsis pathophysiology has resolved-he has been afebrile since admission.  Blood cultures negative.  Chest x-ray/UA negative for infection as well.  Though he has a history of recurrent aspiration pneumonitis-he has no infiltrate seen on chest x-ray-furthermore he does not have any features of pneumonia-he denies cough as well.  Not sure if this was a viral syndrome-discontinue all antibiotics and observe.  Chronic kidney disease stage III: Creatinine close to usual baseline, continue to monitor electrolytes periodically.    Achalasia: Followed by gastroenterology-denies dysphagia to me-most recent esophagogram showed a potential esophageal stricture, since he seems to be apparently asymptomatic-we will defer to the outpatient setting once he has recovered from acute illness  COPD: Stable with no evidence of exacerbation.  Continue with bronchodilators as ordered.    Chronic systolic heart failure (EF 20-25% by TTE on 10/15/2017): Compensated-continue Lasix.    Dyslipidemia: Continue statin and fenofibrate.  DM-2: CBG stable with SSI  Mild dementia/cognitive dysfunction: Awake and mostly alert during my exam this morning-continue Aricept and Namenda  DVT  Prophylaxis: Prophylactic Heparin   Code Status: Full code   Family Communication: None at bedside  Disposition Plan: Remain inpatient-water in the 24 hours-if he is afebrile-home likely in the next day or so.  Antimicrobial agents: Anti-infectives (From admission, onward)   Start     Dose/Rate Route Frequency Ordered Stop   12/20/17 0500  vancomycin (VANCOCIN) IVPB 750 mg/150 ml premix  Status:  Discontinued     750 mg 150 mL/hr over 60 Minutes Intravenous Every 12 hours 12/19/17 1518 12/21/17 0820   12/19/17 2200  piperacillin-tazobactam (ZOSYN) IVPB 3.375 g  Status:  Discontinued     3.375 g 12.5 mL/hr over 240 Minutes Intravenous Every 8 hours 12/19/17 1518 12/21/17 0820   12/19/17 1530  vancomycin (VANCOCIN) 1,500 mg in sodium chloride 0.9 % 500 mL IVPB     1,500 mg 250 mL/hr over 120 Minutes Intravenous  Once 12/19/17 1513 12/19/17 1808   12/19/17 1515  piperacillin-tazobactam (ZOSYN) IVPB 3.375 g     3.375 g 100 mL/hr over 30 Minutes Intravenous  Once 12/19/17 1508 12/19/17 1630   12/19/17 1515  vancomycin (VANCOCIN) IVPB 1000 mg/200 mL premix  Status:  Discontinued     1,000 mg 200 mL/hr over 60 Minutes Intravenous  Once 12/19/17 1508 12/19/17 1513      Procedures: None  CONSULTS:  None  Time spent: 25 minutes-Greater than 50% of this time was spent in counseling, explanation of diagnosis, planning of further management, and coordination of care.  MEDICATIONS: Scheduled Meds: . aspirin EC  81 mg Oral Daily  . cholecalciferol  2,000 Units Oral Daily  . donepezil  10 mg Oral  q morning - 10a  . finasteride  5 mg Oral Daily  . furosemide  20 mg Oral Daily  . heparin  5,000 Units Subcutaneous Q8H  . insulin aspart  0-9 Units Subcutaneous TID WC  . memantine  10 mg Oral BID  . mometasone-formoterol  2 puff Inhalation BID  . pantoprazole  40 mg Oral BID  . pravastatin  40 mg Oral QHS  . tiotropium  18 mcg Inhalation Daily   Continuous Infusions:  PRN  Meds:.acetaminophen **OR** acetaminophen, albuterol, bisacodyl, ondansetron **OR** ondansetron (ZOFRAN) IV, senna-docusate   PHYSICAL EXAM: Vital signs: Vitals:   12/21/17 0448 12/21/17 0825 12/21/17 0907 12/21/17 0910  BP: 115/72 (!) 108/54    Pulse: (!) 28 62 61   Resp: 14 18 18    Temp: 98.7 F (37.1 C) 98.6 F (37 C)    TempSrc: Oral Oral    SpO2: 99% 95% 98% 98%  Weight:      Height:       Filed Weights   12/19/17 1420 12/20/17 0213 12/20/17 2000  Weight: 70.8 kg (156 lb) 72.8 kg (160 lb 6.4 oz) 72 kg (158 lb 11.7 oz)   Body mass index is 23.44 kg/m.   General appearance:Awake, alert, not in any distress.  Eyes:no scleral icterus. HEENT: Atraumatic and Normocephalic Neck: supple, no JVD. Resp:Good air entry bilaterally,no rales or rhonchi CVS: S1 S2 regular, no murmurs.  GI: Bowel sounds present, Non tender and not distended with no gaurding, rigidity or rebound. Extremities: B/L Lower Ext shows no edema, both legs are warm to touch Neurology:  Non focal Psychiatric: Normal judgment and insight. Normal mood. Musculoskeletal:No digital cyanosis Skin:No Rash, warm and dry Wounds:N/A  I have personally reviewed following labs and imaging studies  LABORATORY DATA: CBC: Recent Labs  Lab 12/19/17 1431 12/20/17 0351 12/21/17 0658  WBC 19.8* 12.0* 7.8  HGB 12.6* 11.1* 11.2*  HCT 41.1 36.7* 36.7*  MCV 90.1 90.2 89.5  PLT 195 174 154    Basic Metabolic Panel: Recent Labs  Lab 12/19/17 1431 12/20/17 0351 12/21/17 0658  NA 141 143 144  K 4.3 3.7 3.9  CL 104 109 110  CO2 28 26 29   GLUCOSE 146* 113* 110*  BUN 38* 34* 26*  CREATININE 2.11* 1.87* 1.74*  CALCIUM 9.0 8.3* 8.6*    GFR: Estimated Creatinine Clearance: 32.7 mL/min (A) (by C-G formula based on SCr of 1.74 mg/dL (H)).  Liver Function Tests: No results for input(s): AST, ALT, ALKPHOS, BILITOT, PROT, ALBUMIN in the last 168 hours. No results for input(s): LIPASE, AMYLASE in the last 168  hours. No results for input(s): AMMONIA in the last 168 hours.  Coagulation Profile: No results for input(s): INR, PROTIME in the last 168 hours.  Cardiac Enzymes: No results for input(s): CKTOTAL, CKMB, CKMBINDEX, TROPONINI in the last 168 hours.  BNP (last 3 results) No results for input(s): PROBNP in the last 8760 hours.  HbA1C: No results for input(s): HGBA1C in the last 72 hours.  CBG: Recent Labs  Lab 12/20/17 0801 12/20/17 1147 12/20/17 1621 12/20/17 2159 12/21/17 0738  GLUCAP 101* 138* 121* 98 124*    Lipid Profile: No results for input(s): CHOL, HDL, LDLCALC, TRIG, CHOLHDL, LDLDIRECT in the last 72 hours.  Thyroid Function Tests: No results for input(s): TSH, T4TOTAL, FREET4, T3FREE, THYROIDAB in the last 72 hours.  Anemia Panel: No results for input(s): VITAMINB12, FOLATE, FERRITIN, TIBC, IRON, RETICCTPCT in the last 72 hours.  Urine analysis:    Component Value  Date/Time   COLORURINE YELLOW 12/19/2017 [place] 12/19/2017 1605   LABSPEC 1.015 12/19/2017 1605   PHURINE 5.0 12/19/2017 [place] 12/19/2017 [place] 12/19/2017 [place] 12/19/2017 1605   BILIRUBINUR neg 06/06/2012 [place] 12/19/2017 1605   PROTEINUR NEGATIVE 12/19/2017 1605   UROBILINOGEN 0.2 05/12/2015 0920   NITRITE NEGATIVE 12/19/2017 1605   LEUKOCYTESUR NEGATIVE 12/19/2017 1605    Sepsis Labs: Lactic Acid, Venous    Component Value Date/Time   LATICACIDVEN 1.32 12/19/2017 1640    MICROBIOLOGY: Recent Results (from the past 240 hour(s))  Blood culture (routine x 2)     Status: None (Preliminary result)   Collection Time: 12/19/17  3:15 PM  Result Value Ref Range Status   Specimen Description BLOOD BLOOD RIGHT FOREARM  Final   Special Requests   Final    BOTTLES DRAWN AEROBIC AND ANAEROBIC Blood Culture adequate volume   Culture   Final    NO GROWTH < 24 HOURS Performed at [facility], [place] [address], [place], [place] [address]    Report Status PENDING  Incomplete  Blood culture (routine x 2)     Status: None (Preliminary result)   Collection Time: 12/19/17  3:19 PM  Result Value Ref Range Status   Specimen Description BLOOD LEFT HAND  Final   Special Requests   Final    BOTTLES DRAWN AEROBIC AND ANAEROBIC Blood Culture adequate volume   Culture PENDING  Incomplete   Report Status PENDING  Incomplete  Urine culture     Status: None   Collection Time: 12/19/17  4:05 PM  Result Value Ref Range Status   Specimen Description URINE, CLEAN CATCH  Final   Special Requests NONE  Final   Culture   Final    NO GROWTH Performed at [facility], [place] [address] [place], [place] [address]    Report Status 12/20/2017 FINAL  Final    RADIOLOGY STUDIES/RESULTS: Dg Chest 2 View  Result Date: 12/19/2017 CLINICAL DATA:  Generalized weakness and fever. EXAM: CHEST - 2 VIEW COMPARISON:  10/14/2017. FINDINGS: Unchanged cardiomediastinal silhouette. Dual lead pacer is stable. Upper lobe RIGHT greater than LEFT pulmonary opacities do not appear to have progressed. No consolidation or edema. No effusion or pneumothorax. IMPRESSION: Stable chest. Electronically Signed   By: Staci Righter M.D.   On: 12/19/2017 15:36     LOS: 2 days   Oren Binet, MD  [facility]  If 7PM-7AM, please contact night-coverage  Please page via [email or web address]-Password [email or web address]-click on MD name and type text message  12/21/2017, 10:38 AM

## 2017-12-21 NOTE — Evaluation (Signed)
Physical Therapy Evaluation [name] MRN: [id number] DOB: Mar 31, 1934 Today's Date: 12/21/2017   History of Present Illness  Pt. is a 84 y.o. M with significant PMH of CAD, COPD, CHF, hisotyr of pneumonia, who presents with 1 day history of lethargy/confusion ,chills, and low grade fever.  Clinical Impression  Patient evaluated by Physical Therapy with no further acute PT needs identified. All education has been completed and the patient has no further questions. Patient able to ambulate 400 feet with no assistive device modified independently and negotiate 12 steps with supervision. Scored 20/24 on Dynamic Gait Index, indicating he is not at high risk for falls. No equipment/follow up needs. Patient is very active at baseline and enjoys going on long walks. PT is signing off. Thank you for this referral.      Follow Up Recommendations No PT follow up    Equipment Recommendations  None recommended by PT    Recommendations for Other Services       Precautions / Restrictions Precautions Precautions: None Restrictions Weight Bearing Restrictions: No      Mobility  Bed Mobility Overal bed mobility: Independent                Transfers Overall transfer level: Independent                  Ambulation/Gait Ambulation/Gait assistance: Modified independent (Device/Increase time) Ambulation Distance (Feet): 400 Feet Assistive device: None Gait Pattern/deviations: WFL(Within Functional Limits)     General Gait Details: decreased reciprocal arm swing but otherwise WFL.   Stairs Stairs: Yes Stairs assistance: Supervision Stair Management: One rail Right Number of Stairs: 12 General stair comments: Step over step with ascending and step by step with descending  Wheelchair Mobility    Modified Rankin (Stroke Patients Only)       Balance Overall balance assessment: Needs assistance Sitting-balance support: No upper extremity  supported;Feet supported Sitting balance-Leahy Scale: Normal     Standing balance support: No upper extremity supported;During functional activity Standing balance-Leahy Scale: Good                   Standardized Balance Assessment Standardized Balance Assessment : Dynamic Gait Index   Dynamic Gait Index Level Surface: Normal Change in Gait Speed: Mild Impairment Gait with Horizontal Head Turns: Normal Gait with Vertical Head Turns: Normal Gait and Pivot Turn: Mild Impairment Step Over Obstacle: Normal Step Around Obstacles: Normal Steps: Moderate Impairment Total Score: 20       Pertinent Vitals/Pain Pain Assessment: No/denies pain    Home Living Family/patient expects to be discharged to:: Private residence Living Arrangements: Alone Available Help at Discharge: Family;Available PRN/intermittently Type of Home: House Home Access: Stairs to enter Entrance Stairs-Rails: Right;Left(uses right railing) Entrance Stairs-Number of Steps: 3 Home Layout: Two level;Able to live on main level with bedroom/bathroom;Full bath on main level Home Equipment: Cane - single point;Walker - 2 wheels;Walker - 4 wheels;Shower seat;Grab bars - tub/shower;Hand held shower head Additional Comments: he walks daily for exercise    Prior Function Level of Independence: Independent         Comments: uses walking stick when walking in the woods     Hand Dominance   Dominant Hand: Right    Extremity/Trunk Assessment   Upper Extremity Assessment Upper Extremity Assessment: Overall WFL for tasks assessed    Lower Extremity Assessment Lower Extremity Assessment: Overall WFL for tasks assessed    Cervical / Trunk Assessment Cervical / Trunk Assessment: Kyphotic  Communication   Communication: HOH  Cognition Arousal/Alertness: Awake/alert Behavior During Therapy: WFL for tasks assessed/performed Overall Cognitive Status: History of cognitive impairments - at baseline                                  General Comments: history of dementia. Alert and oriented x3 however did have mild confusion.      General Comments  Occasional dry cough.    Exercises     Assessment/Plan    PT Assessment Patent does not need any further PT services  PT Problem List         PT Treatment Interventions      PT Goals (Current goals can be found in the Care Plan section)  Acute Rehab PT Goals Patient Stated Goal: continue going on walks PT Goal Formulation: All assessment and education complete, DC therapy    Frequency     Barriers to discharge        Co-evaluation               AM-PAC PT "6 Clicks" Daily Activity  Outcome Measure Difficulty turning over in bed (including adjusting bedclothes, sheets and blankets)?: None Difficulty moving from lying on back to sitting on the side of the bed? : None Difficulty sitting down on and standing up from a chair with arms (e.g., wheelchair, bedside commode, etc,.)?: None Help needed moving to and from a bed to chair (including a wheelchair)?: None Help needed walking in hospital room?: None Help needed climbing 3-5 steps with a railing? : A Little 6 Click Score: 23    End of Session Equipment Utilized During Treatment: Gait belt Activity Tolerance: Patient tolerated treatment well Patient left: in chair;with call bell/phone within reach Nurse Communication: Mobility status PT Visit Diagnosis: Unsteadiness on feet (R26.81);Muscle weakness (generalized) (M62.81)    Time: 9373-4287 PT Time Calculation (min) (ACUTE ONLY): 21 min   Charges:   PT Evaluation $PT Eval Moderate Complexity: 1 Mod     PT G Codes:        Ellamae Sia, PT, DPT Acute Rehabilitation Services  Pager: [phone]   Willy Eddy 12/21/2017, 10:43 AM

## 2017-12-22 LAB — CBC
HEMATOCRIT: 36.5 % — AB (ref 39.0–52.0)
HEMOGLOBIN: 11.1 g/dL — AB (ref 13.0–17.0)
MCH: 27.1 pg (ref 26.0–34.0)
MCHC: 30.4 g/dL (ref 30.0–36.0)
MCV: 89 fL (ref 78.0–100.0)
Platelets: 169 10*3/uL (ref 150–400)
RBC: 4.1 MIL/uL — ABNORMAL LOW (ref 4.22–5.81)
RDW: 15.7 % — ABNORMAL HIGH (ref 11.5–15.5)
WBC: 7.2 10*3/uL (ref 4.0–10.5)

## 2017-12-22 LAB — GLUCOSE, CAPILLARY
GLUCOSE-CAPILLARY: 107 mg/dL — AB (ref 65–99)
Glucose-Capillary: 103 mg/dL — ABNORMAL HIGH (ref 65–99)

## 2017-12-22 NOTE — Discharge Summary (Addendum)
Physician Discharge Summary  Antonio Ray:[id number] DOB: 24-Dec-1933 DOA: 12/19/2017  PCP: [name], MD  Admit date: 12/19/2017 Discharge date: 12/22/2017  Admitted From: Home Disposition:  Home  Recommendations for Outpatient Follow-up:  1. Follow up with PCP in 1-2 weeks 2. Follow up with Primary Cardiology within 1 week 3. Follow up with Gastroenterologist with 1-2 weeks  4. Please obtain BMP/CBC in one week   Home Health: None Equipment/Devices: None  Discharge Condition: Stable  CODE STATUS: Full code Diet recommendation: Heart Healthy / Carb Modified   Brief/Interim Summary: Patient is a 84 y.o. male with prior history of achalasia, recurrent aspiration pneumonitis, chronic systolic heart failure, COPD chronic kidney disease stage III-presented with 1 day history of lethargy/confusion, chills and low-grade fever.  No foci of infection apparent on exam or by imaging-empirically therefore admitted to hospitalitis service and started on vancomycin/Zosyn and antibiotics were discontinued on 12/21/2017. See below for further details  Discharge Diagnoses:  Sepsis: Sepsis pathophysiology has resolved-he has been afebrile since admission.  Blood cultures negative, sputum culture pending.  Chest x-ray/UA negative for infection as well. Though he has a history of recurrent aspiration pneumonitis-he has no infiltrate seen on chest x-ray-furthermore he does not have any features of pneumonia-he denies cough as well.  Not sure if this was a viral syndrome-discontinued all antibiotics on 5/23- he was observed for 24 hours off Abx-he continues to do well-afebrile. Stable to discharge off Abx-Dr Sloan Leiter spoke with patient's son over the phone, and explained above and discharge plans  Chronic kidney disease stage III: Creatinine continues to return to his baseline- continue to monitor electrolytes periodically and refer to PCP   Achalasia: Followed by gastroenterology-denies dysphagia  to me-most recent esophagogram showed a potential esophageal stricture, since he seems to be at his usual baseline, further work up etc deferred to the outpatient setting  COPD: Stable with no evidence of exacerbation. Continue with bronchodilators as ordered.    Chronic systolic heart failure (EF 20-25% by TTE on 10/15/2017): Compensated-continue Lasix.    Dyslipidemia: Continue statin and fenofibrate upon discharge  DM-2: CBG stable with SSI, continue home SSI   Mild dementia/cognitive dysfunction: Awake and mostly alert during my exam this morning-continue Aricept and Namenda  Discharge Instructions  Discharge Instructions    ([place]) Call MD:  Anytime you have any of the following symptoms: 1) 3 pound weight gain in 24 hours or 5 pounds in 1 week 2) shortness of breath, with or without a dry hacking cough 3) swelling in the hands, feet or stomach 4) if you have to sleep on extra pillows at night in order to breathe.   Complete by:  As directed    Call MD for:  difficulty breathing, headache or visual disturbances   Complete by:  As directed    Call MD for:  persistant nausea and vomiting   Complete by:  As directed    Call MD for:  temperature >100.4   Complete by:  As directed    Diet - low sodium heart healthy   Complete by:  As directed    Increase activity slowly   Complete by:  As directed      Allergies as of 12/22/2017      Reactions   Codeine Other (See Comments)   Gets very angry, disoriented   Dilaudid [hydromorphone Hcl] Other (See Comments)   VERY AGITATED, HOSTILE   Flomax [tamsulosin Hcl] Shortness Of Breath   Morphine And Related Other (  See Comments)   VERY AGITATED, HOSTILE   Sulfa Antibiotics Shortness Of Breath   Beta Adrenergic Blockers Other (See Comments)   Disorientation   Carvedilol Other (See Comments)   DISORIENTATION      Medication List    TAKE these medications   albuterol 108 (90 Base) MCG/ACT inhaler Commonly known  as:  PROVENTIL HFA;VENTOLIN HFA Inhale 2 puffs into the lungs every 6 (six) hours as needed for wheezing or shortness of breath.   aspirin 81 MG EC tablet Take 81 mg by mouth daily.   BASAGLAR KWIKPEN 100 UNIT/ML Sopn INJECT 5 UNITS UNDER THE SKIN EVERY MORNING What changed:    how much to take  how to take this  when to take this  additional instructions   donepezil 10 MG tablet Commonly known as:  ARICEPT Take 1 tablet (10 mg total) by mouth every morning.   fenofibrate 145 MG tablet Commonly known as:  TRICOR TAKE 1 TABLET BY MOUTH EVERY DAY What changed:    how much to take  how to take this  when to take this   finasteride 5 MG tablet Commonly known as:  PROSCAR Take 5 mg by mouth daily.   furosemide 20 MG tablet Commonly known as:  LASIX Take 1 tablet (20 mg total) by mouth daily.   insulin aspart 100 UNIT/ML injection Commonly known as:  novoLOG Inject 4-10 Units into the skin 3 (three) times daily before meals.   loratadine 10 MG tablet Commonly known as:  CLARITIN Take 1 tablet (10 mg total) by mouth daily.   memantine 10 MG tablet Commonly known as:  NAMENDA Take 1 tablet (10 mg total) by mouth 2 (two) times daily.   nitroGLYCERIN 0.2 mg/hr patch Commonly known as:  NITRODUR - Dosed in mg/24 hr Place 1 patch (0.2 mg total) onto the skin daily. Alternates patch placement with odd/even days. What changed:  additional instructions   pantoprazole 40 MG tablet Commonly known as:  PROTONIX Take 1 tablet (40 mg total) by mouth 2 (two) times daily. What changed:    when to take this  Another medication with the same name was removed. Continue taking this medication, and follow the directions you see here.   pravastatin 40 MG tablet Commonly known as:  PRAVACHOL Take 1 tablet (40 mg total) by mouth at bedtime.   PRESERVISION AREDS 2 Caps Take 1 capsule by mouth 2 (two) times daily.   SPIRIVA HANDIHALER 18 MCG inhalation capsule Generic drug:   tiotropium INHALE CONTENTS OF 1 CAPSULE VIA HANDIHALER ONCE DAILY   SYMBICORT 160-4.5 MCG/ACT inhaler Generic drug:  budesonide-formoterol INHALE 2 PUFFS BY MOUTH TWICE DAILY What changed:    how much to take  how to take this  when to take this   Vitamin D 2000 units tablet Take 2,000 Units by mouth daily.      Follow-up Information    [name], MD. Schedule an appointment as soon as possible for a visit in 1 week.   Specialty:  Family Medicine Why:  Appointment date on 01/01/2018 at 2:15p Contact information: [place] [address] [phone]        Martinique, Peter M, MD. Schedule an appointment as soon as possible for a visit in 1 week.   Specialty:  Cardiology Why:  Appointment date on 01/16/2018 at 8:30a. Please call office to reschedule this appointment if youre unable to keep. Thank you.  Contact information: [address] [employer]  [place] [address] [phone]        Mauri Pole, MD. Schedule an appointment as soon as possible for a visit in 2 week(s).   Specialty:  Gastroenterology Contact information: [place] [address] [phone]          Allergies  Allergen Reactions  . Codeine Other (See Comments)    Gets very angry, disoriented  . Dilaudid [Hydromorphone Hcl] Other (See Comments)    VERY AGITATED, HOSTILE  . Flomax [Tamsulosin Hcl] Shortness Of Breath  . Morphine And Related Other (See Comments)    VERY AGITATED, HOSTILE  . Sulfa Antibiotics Shortness Of Breath  . Beta Adrenergic Blockers Other (See Comments)    Disorientation  . Carvedilol Other (See Comments)    DISORIENTATION    Consultations:  None   Procedures/Studies: Dg Chest 2 View  Result Date: 12/19/2017 CLINICAL DATA:  Generalized weakness and fever. EXAM: CHEST - 2 VIEW COMPARISON:  10/14/2017. FINDINGS: Unchanged cardiomediastinal silhouette. Dual lead pacer is stable. Upper lobe RIGHT greater than  LEFT pulmonary opacities do not appear to have progressed. No consolidation or edema. No effusion or pneumothorax. IMPRESSION: Stable chest. Electronically Signed   By: Staci Righter M.D.   On: 12/19/2017 15:36     Subjective: Sitting in chair this am, stating he feel better today and has no new complaints this am. He is eager to be discharged home.   Discharge Exam: Vitals:   12/22/17 0802 12/22/17 0839  BP:  (!) 105/51  Pulse:  (!) 58  Resp:  18  Temp:  98.1 F (36.7 C)  SpO2: 92% 96%   Vitals:   12/22/17 0536 12/22/17 0801 12/22/17 0802 12/22/17 0839  BP: (!) 104/52   (!) 105/51  Pulse: (!) 56   (!) 58  Resp: 18   18  Temp: 98.6 F (37 C)   98.1 F (36.7 C)  TempSrc: Oral   Oral  SpO2: 96% 92% 92% 96%  Weight: 74.8 kg (164 lb 14.5 oz)     Height:        General: Pt is alert, awake, not in acute distress Cardiovascular: RRR, S1/S2 +, no rubs, no gallops Respiratory: CTA bilaterally, no wheezing, no rhonchi Abdominal: Soft, NT, ND, bowel sounds + Extremities: no edema, no cyanosis    The results of significant diagnostics from this hospitalization (including imaging, microbiology, ancillary and laboratory) are listed below for reference.     Microbiology: Recent Results (from the past 240 hour(s))  Blood culture (routine x 2)     Status: None (Preliminary result)   Collection Time: 12/19/17  3:15 PM  Result Value Ref Range Status   Specimen Description BLOOD BLOOD RIGHT FOREARM  Final   Special Requests   Final    BOTTLES DRAWN AEROBIC AND ANAEROBIC Blood Culture adequate volume   Culture   Final    NO GROWTH 2 DAYS Performed at [facility], [address] [place], [place] [address]    Report Status PENDING  Incomplete  Blood culture (routine x 2)     Status: None (Preliminary result)   Collection Time: 12/19/17  3:19 PM  Result Value Ref Range Status   Specimen Description BLOOD LEFT HAND  Final   Special Requests   Final    BOTTLES DRAWN AEROBIC  AND ANAEROBIC Blood Culture adequate volume   Culture   Final    NO GROWTH 2 DAYS Performed at [facility], [place],  [place] [address]    Report Status PENDING  Incomplete  Urine culture     Status: None   Collection Time: 12/19/17  4:05 PM  Result Value Ref Range Status   Specimen Description URINE, CLEAN CATCH  Final   Special Requests NONE  Final   Culture   Final    NO GROWTH Performed at [facility], [address], [place], [place] [address]    Report Status 12/20/2017 FINAL  Final  Culture, sputum-assessment     Status: None   Collection Time: 12/21/17  3:43 PM  Result Value Ref Range Status   Specimen Description EXPECTORATED SPUTUM  Final   Special Requests NONE  Final   Sputum evaluation   Final    THIS SPECIMEN IS ACCEPTABLE FOR SPUTUM CULTURE Performed at [facility], [place] [address] [place], [place] [address]    Report Status 12/21/2017 FINAL  Final  Culture, respiratory (NON-Expectorated)     Status: None (Preliminary result)   Collection Time: 12/21/17  3:43 PM  Result Value Ref Range Status   Specimen Description EXPECTORATED SPUTUM  Final   Special Requests NONE Reflexed from [id number]  Final   Gram Stain   Final    ABUNDANT WBC PRESENT,BOTH PMN AND MONONUCLEAR MODERATE GRAM POSITIVE COCCI RARE GRAM VARIABLE ROD FEW YEAST Performed at [facility], [place] [address], [place], [place] [address]    Culture PENDING  Incomplete   Report Status PENDING  Incomplete     Labs: BNP (last 3 results) Recent Labs    02/02/17 0820 10/13/17 1401  BNP 108.5* 027.7*   Basic Metabolic Panel: Recent Labs  Lab 12/19/17 1431 12/20/17 0351 12/21/17 0658  NA 141 143 144  K 4.3 3.7 3.9  CL 104 109 110  CO2 28 26 29   GLUCOSE 146* 113* 110*  BUN 38* 34* 26*  CREATININE 2.11* 1.87* 1.74*  CALCIUM 9.0 8.3* 8.6*   Liver Function Tests: No results for input(s): AST, ALT, ALKPHOS, BILITOT, PROT, ALBUMIN in the last 168  hours. No results for input(s): LIPASE, AMYLASE in the last 168 hours. No results for input(s): AMMONIA in the last 168 hours. CBC: Recent Labs  Lab 12/19/17 1431 12/20/17 0351 12/21/17 0658 12/22/17 0603  WBC 19.8* 12.0* 7.8 7.2  HGB 12.6* 11.1* 11.2* 11.1*  HCT 41.1 36.7* 36.7* 36.5*  MCV 90.1 90.2 89.5 89.0  PLT 195 174 159 169   Cardiac Enzymes: No results for input(s): CKTOTAL, CKMB, CKMBINDEX, TROPONINI in the last 168 hours. BNP: Invalid input(s): POCBNP CBG: Recent Labs  Lab 12/21/17 0738 12/21/17 1135 12/21/17 1659 12/21/17 2144 12/22/17 0740  GLUCAP 124* 108* 140* 129* 103*   D-Dimer No results for input(s): DDIMER in the last 72 hours. Hgb A1c No results for input(s): HGBA1C in the last 72 hours. Lipid Profile No results for input(s): CHOL, HDL, LDLCALC, TRIG, CHOLHDL, LDLDIRECT in the last 72 hours. Thyroid function studies No results for input(s): TSH, T4TOTAL, T3FREE, THYROIDAB in the last 72 hours.  Invalid input(s): FREET3 Anemia work up No results for input(s): VITAMINB12, FOLATE, FERRITIN, TIBC, IRON, RETICCTPCT in the last 72 hours. Urinalysis    Component Value Date/Time   COLORURINE YELLOW 12/19/2017 [place] 12/19/2017 1605   LABSPEC 1.015 12/19/2017 1605   PHURINE 5.0 12/19/2017 1605   GLUCOSEU NEGATIVE 12/19/2017 1605   HGBUR NEGATIVE 12/19/2017 [place] 12/19/2017 1605   BILIRUBINUR neg 06/06/2012 1037   KETONESUR NEGATIVE  12/19/2017 1605   PROTEINUR NEGATIVE 12/19/2017 1605   UROBILINOGEN 0.2 05/12/2015 0920   NITRITE NEGATIVE 12/19/2017 1605   LEUKOCYTESUR NEGATIVE 12/19/2017 1605   Sepsis Labs Invalid input(s): PROCALCITONIN,  WBC,  LACTICIDVEN Microbiology Recent Results (from the past 240 hour(s))  Blood culture (routine x 2)     Status: None (Preliminary result)   Collection Time: 12/19/17  3:15 PM  Result Value Ref Range Status   Specimen Description BLOOD BLOOD RIGHT FOREARM  Final    Special Requests   Final    BOTTLES DRAWN AEROBIC AND ANAEROBIC Blood Culture adequate volume   Culture   Final    NO GROWTH 2 DAYS Performed at [facility], [place] [address], [place], [place] [address]    Report Status PENDING  Incomplete  Blood culture (routine x 2)     Status: None (Preliminary result)   Collection Time: 12/19/17  3:19 PM  Result Value Ref Range Status   Specimen Description BLOOD LEFT HAND  Final   Special Requests   Final    BOTTLES DRAWN AEROBIC AND ANAEROBIC Blood Culture adequate volume   Culture   Final    NO GROWTH 2 DAYS Performed at [facility], [place] [address], [place], [place] [address]    Report Status PENDING  Incomplete  Urine culture     Status: None   Collection Time: 12/19/17  4:05 PM  Result Value Ref Range Status   Specimen Description URINE, CLEAN CATCH  Final   Special Requests NONE  Final   Culture   Final    NO GROWTH Performed at [facility], [address] [place], [place] [address]    Report Status 12/20/2017 FINAL  Final  Culture, sputum-assessment     Status: None   Collection Time: 12/21/17  3:43 PM  Result Value Ref Range Status   Specimen Description EXPECTORATED SPUTUM  Final   Special Requests NONE  Final   Sputum evaluation   Final    THIS SPECIMEN IS ACCEPTABLE FOR SPUTUM CULTURE Performed at [facility], [place] [address] [place], [place] [address]    Report Status 12/21/2017 FINAL  Final  Culture, respiratory (NON-Expectorated)     Status: None (Preliminary result)   Collection Time: 12/21/17  3:43 PM  Result Value Ref Range Status   Specimen Description EXPECTORATED SPUTUM  Final   Special Requests NONE Reflexed from [id number]  Final   Gram Stain   Final    ABUNDANT WBC PRESENT,BOTH PMN AND MONONUCLEAR MODERATE GRAM POSITIVE COCCI RARE GRAM VARIABLE ROD FEW YEAST Performed at [facility], [place] [address] [place],  [address]    Culture PENDING  Incomplete   Report Status  PENDING  Incomplete     Time coordinating discharge: 35 minutes  SIGNED:  Johnsie Cancel, NP [facility]   If 7PM-7AM, please contact night-coverage [email or web address] Password [facility] 12/21/2017, 10:22 AM    Attending MD note  Patient was seen, examined,treatment plan was discussed with the NP.  I have personally reviewed the clinical findings, lab, imaging studies and management of this patient in detail. I agree with the documentation, as recorded by the NP.   Subjective: "Doing good doc".  Afebrile  On Exam: Vitals:   12/22/17 0536 12/22/17 0801 12/22/17 0802 12/22/17 0839  BP: (!) 104/52   (!) 105/51  Pulse: (!) 56   (!) 58  Resp: 18   18  Temp: 98.6 F (37 C)  98.1 F (36.7 C)  TempSrc: Oral   Oral  SpO2: 96% 92% 92% 96%  Weight: 74.8 kg (164 lb 14.5 oz)     Height:       Gen. exam: Awake, alert, not in any distress Chest: Good air entry bilaterally, no rhonchi or rales CVS: S1-S2 regular, no murmurs Abdomen: Soft, nontender and nondistended Neurology: Non-focal Skin: No rash or lesions   Lab Data: CBC: Recent Labs  Lab 12/19/17 1431 12/20/17 0351 12/21/17 0658 12/22/17 0603  WBC 19.8* 12.0* 7.8 7.2  HGB 12.6* 11.1* 11.2* 11.1*  HCT 41.1 36.7* 36.7* 36.5*  MCV 90.1 90.2 89.5 89.0  PLT 195 174 159 327    Basic Metabolic Panel: Recent Labs  Lab 12/19/17 1431 12/20/17 0351 12/21/17 0658  NA 141 143 144  K 4.3 3.7 3.9  CL 104 109 110  CO2 28 26 29   GLUCOSE 146* 113* 110*  BUN 38* 34* 26*  CREATININE 2.11* 1.87* 1.74*  CALCIUM 9.0 8.3* 8.6*    Assessment and plan: Sepsis: Foci not evident-possibly viral syndrome.  All cultures negative.  Stop antibiotics on 5/23-he was observed for 24 hours-continues to do well-no cough/shortness of breath or any features suggestive of pneumonia.  Leukocytosis has resolved as well  CKD stage III: Creatinine close to usual baseline.  COPD: Stable without any exacerbation.  Achalasia: Follows with  GI-he is at his usual baseline.  Chronic systolic heart failure: Compensated-continue Lasix  Rest as above  Disposition: Home.  Family communication: Spoke with patient's son over the phone and explained above noted plan and discharge today  Total time spent 35 minutes  [facility]

## 2017-12-22 NOTE — Progress Notes (Signed)
Patient discharged to home. Patient AVS reviewed and signed. Patient capable re-verbalizing medications and follow-up appointments. IV removed. Patient belongings sent with patient. Patient educated to return to the ED in the event of SOB, chest pain or dizziness.   Brier Firebaugh, RN 

## 2017-12-22 NOTE — Care Management Important Message (Signed)
Important Message  Patient Details  Name: Antonio Ray MRN: [id number] Date of Birth: [birth year]   Medicare Important Message Given:  Yes    [name] 12/22/2017, 3:28 PM

## 2017-12-24 LAB — CULTURE, RESPIRATORY

## 2017-12-24 LAB — CULTURE, BLOOD (ROUTINE X 2)
CULTURE: NO GROWTH
CULTURE: NO GROWTH
SPECIAL REQUESTS: ADEQUATE
Special Requests: ADEQUATE

## 2017-12-24 LAB — CULTURE, RESPIRATORY W GRAM STAIN: Culture: NORMAL

## 2017-12-26 ENCOUNTER — Other Ambulatory Visit: Payer: Self-pay | Admitting: Pulmonary Disease

## 2017-12-26 DIAGNOSIS — D225 Melanocytic nevi of trunk: Secondary | ICD-10-CM | POA: Diagnosis not present

## 2017-12-26 DIAGNOSIS — L570 Actinic keratosis: Secondary | ICD-10-CM | POA: Diagnosis not present

## 2017-12-26 DIAGNOSIS — Z85828 Personal history of other malignant neoplasm of skin: Secondary | ICD-10-CM | POA: Diagnosis not present

## 2018-01-01 ENCOUNTER — Other Ambulatory Visit: Payer: Self-pay

## 2018-01-01 ENCOUNTER — Observation Stay (HOSPITAL_COMMUNITY): Payer: Medicare Other

## 2018-01-01 ENCOUNTER — Emergency Department (HOSPITAL_COMMUNITY): Payer: Medicare Other

## 2018-01-01 ENCOUNTER — Telehealth: Payer: Self-pay | Admitting: Pulmonary Disease

## 2018-01-01 ENCOUNTER — Inpatient Hospital Stay (HOSPITAL_COMMUNITY)
Admission: EM | Admit: 2018-01-01 | Discharge: 2018-01-05 | DRG: 177 | Disposition: A | Discharge status: Home / Self Care | Payer: Medicare Other | Attending: Internal Medicine | Admitting: Internal Medicine

## 2018-01-01 DIAGNOSIS — R402413 Glasgow coma scale score 13-15, at hospital admission: Secondary | ICD-10-CM | POA: Diagnosis present

## 2018-01-01 DIAGNOSIS — R6510 Systemic inflammatory response syndrome (SIRS) of non-infectious origin without acute organ dysfunction: Secondary | ICD-10-CM | POA: Diagnosis not present

## 2018-01-01 DIAGNOSIS — Y95 Nosocomial condition: Secondary | ICD-10-CM | POA: Diagnosis present

## 2018-01-01 DIAGNOSIS — I255 Ischemic cardiomyopathy: Secondary | ICD-10-CM | POA: Diagnosis present

## 2018-01-01 DIAGNOSIS — E785 Hyperlipidemia, unspecified: Secondary | ICD-10-CM | POA: Diagnosis present

## 2018-01-01 DIAGNOSIS — R1310 Dysphagia, unspecified: Secondary | ICD-10-CM

## 2018-01-01 DIAGNOSIS — J690 Pneumonitis due to inhalation of food and vomit: Secondary | ICD-10-CM | POA: Diagnosis not present

## 2018-01-01 DIAGNOSIS — Z9861 Coronary angioplasty status: Secondary | ICD-10-CM

## 2018-01-01 DIAGNOSIS — D631 Anemia in chronic kidney disease: Secondary | ICD-10-CM | POA: Diagnosis present

## 2018-01-01 DIAGNOSIS — I130 Hypertensive heart and chronic kidney disease with heart failure and stage 1 through stage 4 chronic kidney disease, or unspecified chronic kidney disease: Secondary | ICD-10-CM | POA: Diagnosis present

## 2018-01-01 DIAGNOSIS — Z882 Allergy status to sulfonamides status: Secondary | ICD-10-CM

## 2018-01-01 DIAGNOSIS — R402441 Other coma, without documented Glasgow coma scale score, or with partial score reported, in the field [EMT or ambulance]: Secondary | ICD-10-CM | POA: Diagnosis not present

## 2018-01-01 DIAGNOSIS — K219 Gastro-esophageal reflux disease without esophagitis: Secondary | ICD-10-CM | POA: Diagnosis present

## 2018-01-01 DIAGNOSIS — I5022 Chronic systolic (congestive) heart failure: Secondary | ICD-10-CM | POA: Diagnosis not present

## 2018-01-01 DIAGNOSIS — N183 Chronic kidney disease, stage 3 unspecified: Secondary | ICD-10-CM | POA: Diagnosis present

## 2018-01-01 DIAGNOSIS — Z87820 Personal history of traumatic brain injury: Secondary | ICD-10-CM

## 2018-01-01 DIAGNOSIS — D72829 Elevated white blood cell count, unspecified: Secondary | ICD-10-CM

## 2018-01-01 DIAGNOSIS — R410 Disorientation, unspecified: Secondary | ICD-10-CM | POA: Diagnosis not present

## 2018-01-01 DIAGNOSIS — R4702 Dysphasia: Secondary | ICD-10-CM | POA: Diagnosis present

## 2018-01-01 DIAGNOSIS — E1122 Type 2 diabetes mellitus with diabetic chronic kidney disease: Secondary | ICD-10-CM | POA: Diagnosis present

## 2018-01-01 DIAGNOSIS — R05 Cough: Secondary | ICD-10-CM | POA: Diagnosis not present

## 2018-01-01 DIAGNOSIS — H9190 Unspecified hearing loss, unspecified ear: Secondary | ICD-10-CM | POA: Diagnosis present

## 2018-01-01 DIAGNOSIS — Z8679 Personal history of other diseases of the circulatory system: Secondary | ICD-10-CM

## 2018-01-01 DIAGNOSIS — R0902 Hypoxemia: Secondary | ICD-10-CM

## 2018-01-01 DIAGNOSIS — N400 Enlarged prostate without lower urinary tract symptoms: Secondary | ICD-10-CM | POA: Diagnosis present

## 2018-01-01 DIAGNOSIS — G9340 Encephalopathy, unspecified: Secondary | ICD-10-CM | POA: Diagnosis present

## 2018-01-01 DIAGNOSIS — K224 Dyskinesia of esophagus: Secondary | ICD-10-CM | POA: Diagnosis present

## 2018-01-01 DIAGNOSIS — Z7982 Long term (current) use of aspirin: Secondary | ICD-10-CM

## 2018-01-01 DIAGNOSIS — I959 Hypotension, unspecified: Secondary | ICD-10-CM | POA: Diagnosis not present

## 2018-01-01 DIAGNOSIS — Z79899 Other long term (current) drug therapy: Secondary | ICD-10-CM

## 2018-01-01 DIAGNOSIS — E860 Dehydration: Secondary | ICD-10-CM | POA: Diagnosis present

## 2018-01-01 DIAGNOSIS — I5042 Chronic combined systolic (congestive) and diastolic (congestive) heart failure: Secondary | ICD-10-CM | POA: Diagnosis not present

## 2018-01-01 DIAGNOSIS — I5043 Acute on chronic combined systolic (congestive) and diastolic (congestive) heart failure: Secondary | ICD-10-CM | POA: Diagnosis present

## 2018-01-01 DIAGNOSIS — I2510 Atherosclerotic heart disease of native coronary artery without angina pectoris: Secondary | ICD-10-CM | POA: Diagnosis not present

## 2018-01-01 DIAGNOSIS — K228 Other specified diseases of esophagus: Secondary | ICD-10-CM | POA: Diagnosis present

## 2018-01-01 DIAGNOSIS — R918 Other nonspecific abnormal finding of lung field: Secondary | ICD-10-CM | POA: Diagnosis not present

## 2018-01-01 DIAGNOSIS — J9601 Acute respiratory failure with hypoxia: Secondary | ICD-10-CM | POA: Diagnosis present

## 2018-01-01 DIAGNOSIS — Z794 Long term (current) use of insulin: Secondary | ICD-10-CM

## 2018-01-01 DIAGNOSIS — Z888 Allergy status to other drugs, medicaments and biological substances status: Secondary | ICD-10-CM

## 2018-01-01 DIAGNOSIS — K220 Achalasia of cardia: Secondary | ICD-10-CM | POA: Diagnosis present

## 2018-01-01 DIAGNOSIS — Z885 Allergy status to narcotic agent status: Secondary | ICD-10-CM

## 2018-01-01 DIAGNOSIS — Z87891 Personal history of nicotine dependence: Secondary | ICD-10-CM

## 2018-01-01 DIAGNOSIS — J449 Chronic obstructive pulmonary disease, unspecified: Secondary | ICD-10-CM | POA: Diagnosis not present

## 2018-01-01 DIAGNOSIS — R4182 Altered mental status, unspecified: Secondary | ICD-10-CM | POA: Diagnosis not present

## 2018-01-01 DIAGNOSIS — R509 Fever, unspecified: Secondary | ICD-10-CM | POA: Diagnosis not present

## 2018-01-01 DIAGNOSIS — Z7951 Long term (current) use of inhaled steroids: Secondary | ICD-10-CM

## 2018-01-01 DIAGNOSIS — R404 Transient alteration of awareness: Secondary | ICD-10-CM

## 2018-01-01 DIAGNOSIS — R419 Unspecified symptoms and signs involving cognitive functions and awareness: Secondary | ICD-10-CM | POA: Diagnosis present

## 2018-01-01 DIAGNOSIS — Z95810 Presence of automatic (implantable) cardiac defibrillator: Secondary | ICD-10-CM

## 2018-01-01 DIAGNOSIS — I25119 Atherosclerotic heart disease of native coronary artery with unspecified angina pectoris: Secondary | ICD-10-CM | POA: Diagnosis present

## 2018-01-01 DIAGNOSIS — F0390 Unspecified dementia without behavioral disturbance: Secondary | ICD-10-CM | POA: Diagnosis present

## 2018-01-01 DIAGNOSIS — I252 Old myocardial infarction: Secondary | ICD-10-CM

## 2018-01-01 LAB — COMPREHENSIVE METABOLIC PANEL
ALBUMIN: 3 g/dL — AB (ref 3.5–5.0)
ALT: 15 U/L — ABNORMAL LOW (ref 17–63)
AST: 27 U/L (ref 15–41)
Alkaline Phosphatase: 38 U/L (ref 38–126)
Anion gap: 10 (ref 5–15)
BUN: 36 mg/dL — AB (ref 6–20)
CHLORIDE: 105 mmol/L (ref 101–111)
CO2: 27 mmol/L (ref 22–32)
Calcium: 9.2 mg/dL (ref 8.9–10.3)
Creatinine, Ser: 1.99 mg/dL — ABNORMAL HIGH (ref 0.61–1.24)
GFR calc Af Amer: 34 mL/min — ABNORMAL LOW (ref >60)
GFR, EST NON AFRICAN AMERICAN: 30 mL/min — AB (ref >60)
GLUCOSE: 99 mg/dL (ref 65–99)
POTASSIUM: 4.3 mmol/L (ref 3.5–5.1)
Sodium: 142 mmol/L (ref 135–145)
Total Bilirubin: 0.7 mg/dL (ref 0.3–1.2)
Total Protein: 6.7 g/dL (ref 6.5–8.1)

## 2018-01-01 LAB — CBC WITH DIFFERENTIAL/PLATELET
ABS IMMATURE GRANULOCYTES: 0.1 10*3/uL (ref 0.0–0.1)
BASOS ABS: 0 10*3/uL (ref 0.0–0.1)
Basophils Relative: 0 %
Eosinophils Absolute: 0 10*3/uL (ref 0.0–0.7)
Eosinophils Relative: 0 %
HCT: 39.3 % (ref 39.0–52.0)
HEMOGLOBIN: 12 g/dL — AB (ref 13.0–17.0)
Immature Granulocytes: 1 %
LYMPHS PCT: 9 %
Lymphs Abs: 2 10*3/uL (ref 0.7–4.0)
MCH: 27.3 pg (ref 26.0–34.0)
MCHC: 30.5 g/dL (ref 30.0–36.0)
MCV: 89.5 fL (ref 78.0–100.0)
MONO ABS: 1 10*3/uL (ref 0.1–1.0)
Monocytes Relative: 5 %
NEUTROS ABS: 18.8 10*3/uL — AB (ref 1.7–7.7)
Neutrophils Relative %: 85 %
Platelets: 267 10*3/uL (ref 150–400)
RBC: 4.39 MIL/uL (ref 4.22–5.81)
RDW: 15.5 % (ref 11.5–15.5)
WBC: 22 10*3/uL — ABNORMAL HIGH (ref 4.0–10.5)

## 2018-01-01 LAB — SEDIMENTATION RATE: SED RATE: 28 mm/h — AB (ref 0–16)

## 2018-01-01 LAB — I-STAT VENOUS BLOOD GAS, ED
Acid-Base Excess: 3 mmol/L — ABNORMAL HIGH (ref 0.0–2.0)
BICARBONATE: 29.3 mmol/L — AB (ref 20.0–28.0)
O2 Saturation: 98 %
PCO2 VEN: 53.8 mmHg (ref 44.0–60.0)
PH VEN: 7.345 (ref 7.250–7.430)
PO2 VEN: 112 mmHg — AB (ref 32.0–45.0)
TCO2: 31 mmol/L (ref 22–32)

## 2018-01-01 LAB — I-STAT CG4 LACTIC ACID, ED: Lactic Acid, Venous: 1.45 mmol/L (ref 0.5–1.9)

## 2018-01-01 LAB — URINALYSIS, COMPLETE (UACMP) WITH MICROSCOPIC
Bacteria, UA: NONE SEEN
Bilirubin Urine: NEGATIVE
GLUCOSE, UA: NEGATIVE mg/dL
HGB URINE DIPSTICK: NEGATIVE
KETONES UR: NEGATIVE mg/dL
Leukocytes, UA: NEGATIVE
NITRITE: NEGATIVE
PH: 5 (ref 5.0–8.0)
Protein, ur: NEGATIVE mg/dL
Specific Gravity, Urine: 1.014 (ref 1.005–1.030)

## 2018-01-01 LAB — APTT: aPTT: 34 seconds (ref 24–36)

## 2018-01-01 LAB — LACTATE DEHYDROGENASE: LDH: 155 U/L (ref 98–192)

## 2018-01-01 LAB — GLUCOSE, CAPILLARY: GLUCOSE-CAPILLARY: 150 mg/dL — AB (ref 65–99)

## 2018-01-01 LAB — BLOOD GAS, VENOUS

## 2018-01-01 LAB — PROTIME-INR
INR: 1.23
Prothrombin Time: 15.4 seconds — ABNORMAL HIGH (ref 11.4–15.2)

## 2018-01-01 LAB — TROPONIN I: TROPONIN I: 0.04 ng/mL — AB (ref <0.03)

## 2018-01-01 LAB — PROCALCITONIN: Procalcitonin: 0.54 ng/mL

## 2018-01-01 LAB — C-REACTIVE PROTEIN: CRP: 3.6 mg/dL — ABNORMAL HIGH (ref <1.0)

## 2018-01-01 LAB — LACTIC ACID, PLASMA: Lactic Acid, Venous: 1.1 mmol/L (ref 0.5–1.9)

## 2018-01-01 LAB — CBG MONITORING, ED: GLUCOSE-CAPILLARY: 100 mg/dL — AB (ref 65–99)

## 2018-01-01 MED ORDER — PIPERACILLIN-TAZOBACTAM 3.375 G IVPB 30 MIN
3.3750 g | Freq: Once | INTRAVENOUS | Status: AC
  Administered 2018-01-01: 3.375 g via INTRAVENOUS
  Filled 2018-01-01: qty 50

## 2018-01-01 MED ORDER — ASPIRIN 81 MG PO CHEW
81.0000 mg | CHEWABLE_TABLET | Freq: Every day | ORAL | Status: DC
  Administered 2018-01-04 – 2018-01-05 (×2): 81 mg via ORAL
  Filled 2018-01-01 (×3): qty 1

## 2018-01-01 MED ORDER — MEMANTINE HCL 10 MG PO TABS
10.0000 mg | ORAL_TABLET | Freq: Two times a day (BID) | ORAL | Status: DC
  Administered 2018-01-01 – 2018-01-05 (×5): 10 mg via ORAL
  Filled 2018-01-01 (×7): qty 1

## 2018-01-01 MED ORDER — SODIUM CHLORIDE 0.9 % IV BOLUS
1000.0000 mL | Freq: Once | INTRAVENOUS | Status: AC
  Administered 2018-01-01: 1000 mL via INTRAVENOUS

## 2018-01-01 MED ORDER — SODIUM CHLORIDE 0.9 % IV SOLN
INTRAVENOUS | Status: DC
  Administered 2018-01-01: 23:00:00 via INTRAVENOUS

## 2018-01-01 MED ORDER — PANTOPRAZOLE SODIUM 40 MG PO TBEC
40.0000 mg | DELAYED_RELEASE_TABLET | Freq: Every day | ORAL | Status: DC
  Administered 2018-01-04 – 2018-01-05 (×2): 40 mg via ORAL
  Filled 2018-01-01 (×3): qty 1

## 2018-01-01 MED ORDER — INSULIN ASPART 100 UNIT/ML ~~LOC~~ SOLN: 0.0000 [IU] | Freq: Every day | SUBCUTANEOUS | Status: DC

## 2018-01-01 MED ORDER — INSULIN GLARGINE 100 UNIT/ML ~~LOC~~ SOLN
5.0000 [IU] | SUBCUTANEOUS | Status: DC
  Administered 2018-01-02: 5 [IU] via SUBCUTANEOUS
  Filled 2018-01-01: qty 0.05

## 2018-01-01 MED ORDER — PRAVASTATIN SODIUM 40 MG PO TABS
40.0000 mg | ORAL_TABLET | Freq: Every day | ORAL | Status: DC
  Administered 2018-01-01 – 2018-01-04 (×3): 40 mg via ORAL
  Filled 2018-01-01 (×3): qty 1

## 2018-01-01 MED ORDER — VANCOMYCIN HCL 10 G IV SOLR
1250.0000 mg | Freq: Once | INTRAVENOUS | Status: AC
  Administered 2018-01-01: 1250 mg via INTRAVENOUS
  Filled 2018-01-01: qty 1250

## 2018-01-01 MED ORDER — MOMETASONE FURO-FORMOTEROL FUM 200-5 MCG/ACT IN AERO
2.0000 | INHALATION_SPRAY | Freq: Two times a day (BID) | RESPIRATORY_TRACT | Status: DC
  Administered 2018-01-02 – 2018-01-05 (×7): 2 via RESPIRATORY_TRACT
  Filled 2018-01-01: qty 8.8

## 2018-01-01 MED ORDER — INSULIN ASPART 100 UNIT/ML ~~LOC~~ SOLN
0.0000 [IU] | Freq: Three times a day (TID) | SUBCUTANEOUS | Status: DC
  Administered 2018-01-04 – 2018-01-05 (×2): 2 [IU] via SUBCUTANEOUS

## 2018-01-01 MED ORDER — FENOFIBRATE 160 MG PO TABS
160.0000 mg | ORAL_TABLET | Freq: Every day | ORAL | Status: DC
  Administered 2018-01-04 – 2018-01-05 (×2): 160 mg via ORAL
  Filled 2018-01-01 (×3): qty 1

## 2018-01-01 MED ORDER — VANCOMYCIN HCL IN DEXTROSE 1-5 GM/200ML-% IV SOLN
1000.0000 mg | INTRAVENOUS | Status: DC
  Administered 2018-01-02: 1000 mg via INTRAVENOUS
  Filled 2018-01-01: qty 200

## 2018-01-01 MED ORDER — PIPERACILLIN-TAZOBACTAM 3.375 G IVPB
3.3750 g | Freq: Three times a day (TID) | INTRAVENOUS | Status: DC
  Administered 2018-01-02 – 2018-01-03 (×4): 3.375 g via INTRAVENOUS
  Filled 2018-01-01 (×6): qty 50

## 2018-01-01 MED ORDER — DONEPEZIL HCL 10 MG PO TABS
10.0000 mg | ORAL_TABLET | Freq: Every morning | ORAL | Status: DC
  Administered 2018-01-04 – 2018-01-05 (×2): 10 mg via ORAL
  Filled 2018-01-01 (×3): qty 1

## 2018-01-01 MED ORDER — ENOXAPARIN SODIUM 30 MG/0.3ML ~~LOC~~ SOLN
30.0000 mg | SUBCUTANEOUS | Status: DC
  Administered 2018-01-01 – 2018-01-02 (×2): 30 mg via SUBCUTANEOUS
  Filled 2018-01-01 (×2): qty 0.3

## 2018-01-01 MED ORDER — SODIUM CHLORIDE 0.9 % IV SOLN
500.0000 mg | INTRAVENOUS | Status: DC
  Administered 2018-01-01 – 2018-01-02 (×2): 500 mg via INTRAVENOUS
  Filled 2018-01-01 (×2): qty 500

## 2018-01-01 MED ORDER — GUAIFENESIN ER 600 MG PO TB12
600.0000 mg | ORAL_TABLET | Freq: Two times a day (BID) | ORAL | Status: DC
  Administered 2018-01-01 – 2018-01-05 (×5): 600 mg via ORAL
  Filled 2018-01-01 (×6): qty 1

## 2018-01-01 MED ORDER — ONDANSETRON HCL 4 MG PO TABS: 4.0000 mg | ORAL_TABLET | Freq: Four times a day (QID) | ORAL | Status: DC | PRN

## 2018-01-01 MED ORDER — LORATADINE 10 MG PO TABS
10.0000 mg | ORAL_TABLET | Freq: Every day | ORAL | Status: DC
  Administered 2018-01-04 – 2018-01-05 (×2): 10 mg via ORAL
  Filled 2018-01-01 (×3): qty 1

## 2018-01-01 MED ORDER — ALBUTEROL SULFATE (2.5 MG/3ML) 0.083% IN NEBU: 2.5000 mg | INHALATION_SOLUTION | RESPIRATORY_TRACT | Status: DC | PRN

## 2018-01-01 MED ORDER — ONDANSETRON HCL 4 MG/2ML IJ SOLN: 4.0000 mg | Freq: Four times a day (QID) | INTRAMUSCULAR | Status: DC | PRN

## 2018-01-01 MED ORDER — ACETAMINOPHEN 650 MG RE SUPP: 650.0000 mg | Freq: Four times a day (QID) | RECTAL | Status: DC | PRN

## 2018-01-01 MED ORDER — ACETAMINOPHEN 325 MG PO TABS: 650.0000 mg | ORAL_TABLET | Freq: Four times a day (QID) | ORAL | Status: DC | PRN

## 2018-01-01 NOTE — ED Provider Notes (Addendum)
[facility] EMERGENCY DEPARTMENT Provider Note   CSN: [id number] Arrival date & time: 01/01/18  1446     History   Chief Complaint Chief Complaint  Patient presents with  . Altered Mental Status    HPI Antonio Ray is a 84 y.o. male.  HPI   Patient is an 63 male with history of achalasia, heart failure reduced ejection fraction, AICD in place, CKD, type 2 diabetes mellitus, COPD presenting for an episode of confusion, and fever.  Patient presents with his son who assists in history taking.  Patient's son reports that patient was last known normal around 7 AM when his sister brought the patient breakfast.  Subsequently, patient describes an episode around 9-10 AM where he was transiently confused, and could not figure out how to work a telephone to call his son.  Patient reports that his inability to discern how to use a phone lasted a couple hours.  Patient was able to be reached on the phone by his daughter and his son, they will stated that he felt slightly disoriented.  Patient has had multiple febrile illnesses since March 2018, including aspiration pneumonia in March 2019 as well as an admission 2 weeks ago for a febrile illness of unknown origin.  Patient was also having altered mental status at that time.  Patient's son also notes that patient was hypoxic to 88% on room air with a home monitor, and does not typically use oxygen at home despite his COPD diagnosis.  Patient's son also reporting a tactile fever. Patient denying any chest pain, shortness of breath, headaches, weakness, difficult to speech, abdominal pain, nausea, vomiting, diarrhea, dysuria.  Patient denies any syncopal episodes today.  According to the patient's son, he completely returned to baseline mental status after his hospitalization 2 weeks ago, and is not at baseline now in ED.  Past Medical History:  Diagnosis Date  . Achalasia   . AICD (automatic cardioverter/defibrillator) present  03/30/2011   Analyze ST study patient  . Anginal pain ([place])   . BPH (benign prostatic hyperplasia)   . CHF (congestive heart failure) ([place])   . Chronic systolic dysfunction of left ventricle    EF 30-35%, CLASS II - III SYMPTOMS; intolerant to Coreg  . CKD (chronic kidney disease) stage 3, GFR 30-59 ml/min ([name]) 06/08/2012  . COPD (chronic obstructive pulmonary disease) ([place])   . Coronary artery disease    History of remote anterior MI with PCI to LAD in 2006; most recent cath 2007, no intervention required  . DOE (dyspnea on exertion)    with heavy exertion  . Dysrhythmia   . Esophageal dysmotility   . GERD (gastroesophageal reflux disease)   . GERD (gastroesophageal reflux disease)   . Head injury, closed, with concussion 2000ish  . Heart murmur    hx  . HOH (hard of hearing)   . Hyperlipidemia   . Memory loss    improved  . Myocardial infarction ([place]) 1994   ANTERIOR  . Pneumonia "several times"   aspiration pna with at least 3 admits for this 2016.   Marland Kitchen PVC's (premature ventricular contractions)   . Renal failure   . Skin cancer "several"   "forearms; head"  . Type II diabetes mellitus [facility])     Patient Active Problem List   Diagnosis Date Noted  . Generalized weakness 12/19/2017  . Hypoxemia   . Pneumonia, aspiration ([place]) 10/13/2017  . CAP (community acquired pneumonia) 08/19/2017  . Influenza A with pneumonia  08/19/2017  . Type II diabetes mellitus ([place])   . Skin cancer   . Renal failure   . HOH (hard of hearing)   . Heart murmur   . Head injury, closed, with concussion   . Esophageal dysmotility   . Dysrhythmia   . DOE (dyspnea on exertion)   . Chronic systolic dysfunction of left ventricle   . CHF (congestive heart failure) ([place])   . Anginal pain ([place])   . Pneumonia 06/12/2017  . Acute UTI   . Altered mental state 02/15/2017  . Achalasia of esophagus   . Encephalopathy   . Fever chills   . SOB (shortness of breath)   . Acute respiratory failure with  hypoxemia ([place])   . Acute respiratory failure ([place]) 04/21/2016  . Chronic combined systolic and diastolic congestive heart failure ([place]) 04/21/2016  . Memory loss 04/21/2016  . Allergic rhinitis 04/11/2016  . Chronic systolic CHF (congestive heart failure) ([place]) 10/30/2015  . Mild cognitive impairment 08/18/2015  . Difficulty in swallowing   . AKI (acute kidney injury) ([place])   . Type 2 diabetes mellitus with stage 3 chronic kidney disease, with long-term current use of insulin ([place])   . Chronic systolic congestive heart failure ([place])   . Chronic kidney disease, stage III (moderate) ([name])   . Other specified hypotension   . CKD (chronic kidney disease), stage III ([place])   . Other emphysema ([place])   . Systolic CHF, acute on chronic ([name])   . AICD (automatic cardioverter/defibrillator) present   . CAD S/P percutaneous coronary angioplasty   . Acute respiratory failure with hypoxia ([place]) 04/03/2015  . Cardiomyopathy, ischemic 04/03/2015  . Elevated troponin 04/03/2015  . Sepsis ([place]) 03/31/2015  . COPD exacerbation ([place])   . Aspiration into airway 03/21/2015  . Achalasia 03/21/2015  . Dysphagia   . Esophageal dysphagia   . Esophageal dysmotilities   . COPD with acute exacerbation ([place]) 01/14/2015  . Left ventricular apical thrombus 01/14/2015  . Acute on chronic systolic CHF (congestive heart failure) ([place]) 01/14/2015  . ARF (acute renal failure) ([place]) 01/14/2015  . Dysphagia, unspecified(787.20) 10/30/2013  . COPD GOLD IV 10/07/2013  . Leukocytosis 09/11/2013  . Acute encephalopathy 09/10/2013  . Renal failure (ARF), acute on chronic ([name]) 09/10/2013  . Automatic implantable cardioverter-defibrillator in situ 07/06/2012  . CKD (chronic kidney disease) stage 3, GFR 30-59 ml/min ([name]) 06/08/2012  . GERD (gastroesophageal reflux disease) 09/26/2011  . BPH (benign prostatic hyperplasia) 08/22/2011  . COPD (chronic obstructive pulmonary disease) ([place]) 07/17/2011  . S/P ICD (internal  cardiac defibrillator) procedure 04/20/2011  . Coronary artery disease   . Sick sinus syndrome ([place])   . Acute on chronic combined systolic and diastolic congestive heart failure, NYHA class 3 ([place])   . Hyperlipidemia   . PVC's (premature ventricular contractions)   . Myocardial infarction [facility]) 08/01/1992    Past Surgical History:  Procedure Laterality Date  . BALLOON DILATION N/A 09/09/2015   Procedure: BALLOON DILATION;  Surgeon: Mauri Pole, MD;  Location: [facility] ENDOSCOPY;  Service: Endoscopy;  Laterality: N/A;  rigiflex achalasia balloon dilators  . BOTOX INJECTION N/A 04/08/2015   Procedure: BOTOX INJECTION;  Surgeon: Milus Banister, MD;  Location: [facility];  Service: Endoscopy;  Laterality: N/A;  . CARDIAC CATHETERIZATION  01/11/2006   DEMONSTRATES AKINESIA OF THE DISTAL ANTERIOR WALL, DISTAL INFERIOR WALL AND AKINESIA OF THE APEX. THE BASAL SEGMENTS CONTRACT WELL AND OVERALL EF 35%  . CATARACT EXTRACTION W/ INTRAOCULAR LENS  IMPLANT, BILATERAL  08/2014-09/2014  . CHOLECYSTECTOMY  2/11  . CORONARY ANGIOPLASTY  1994   TO THE LAD  . ESOPHAGEAL MANOMETRY N/A 03/16/2015   Procedure: ESOPHAGEAL MANOMETRY (EM);  Surgeon: Jerene Bears, MD;  Location: [facility] ENDOSCOPY;  Service: Gastroenterology;  Laterality: N/A;  . ESOPHAGOGASTRODUODENOSCOPY N/A 04/08/2015   Procedure: ESOPHAGOGASTRODUODENOSCOPY (EGD);  Surgeon: Milus Banister, MD;  Location: [facility];  Service: Endoscopy;  Laterality: N/A;  . ESOPHAGOGASTRODUODENOSCOPY (EGD) WITH PROPOFOL N/A 09/09/2015   Procedure: ESOPHAGOGASTRODUODENOSCOPY (EGD) WITH PROPOFOL;  Surgeon: Mauri Pole, MD;  Location: [facility] ENDOSCOPY;  Service: Endoscopy;  Laterality: N/A;  pt. is to have gastrografin xray post recovery-do not discharge until results are back  . EYE SURGERY    . FOOT FRACTURE SURGERY Right 1980's  . INSERT / REPLACE / REMOVE PACEMAKER    . SKIN CANCER EXCISION  "several"   "forearms, head"        Home Medications    Prior to  Admission medications   Medication Sig Start Date End Date Taking? Authorizing Provider  albuterol (PROVENTIL HFA;VENTOLIN HFA) 108 (90 Base) MCG/ACT inhaler Inhale 2 puffs into the lungs every 6 (six) hours as needed for wheezing or shortness of breath. 11/16/17   Juanito Doom, MD  aspirin 81 MG EC tablet Take 81 mg by mouth daily.      [provider]  Cholecalciferol (VITAMIN D) 2000 units tablet Take 2,000 Units by mouth daily.     [provider]  donepezil (ARICEPT) 10 MG tablet Take 1 tablet (10 mg total) by mouth every morning. 05/01/17   Marcial Pacas, MD  fenofibrate (TRICOR) 145 MG tablet TAKE 1 TABLET BY MOUTH EVERY DAY Patient taking differently: Take 145 mg by mouth once a day 02/10/17   [place]ique, Peter M, MD  finasteride (PROSCAR) 5 MG tablet Take 5 mg by mouth daily. 05/19/17   [provider]  furosemide (LASIX) 20 MG tablet Take 1 tablet (20 mg total) by mouth daily. 10/16/17   Regalado, Belkys A, MD  insulin aspart (NOVOLOG) 100 UNIT/ML injection Inject 4-10 Units into the skin 3 (three) times daily before meals.    [provider]  Insulin Glargine (BASAGLAR KWIKPEN) 100 UNIT/ML SOPN INJECT 5 UNITS UNDER THE SKIN EVERY MORNING Patient taking differently: Inject 8 Units into the skin every morning.  10/16/17   Regalado, Belkys A, MD  loratadine (CLARITIN) 10 MG tablet Take 1 tablet (10 mg total) by mouth daily. 07/02/15   Barton Dubois, MD  memantine (NAMENDA) 10 MG tablet Take 1 tablet (10 mg total) by mouth 2 (two) times daily. 05/01/17   Marcial Pacas, MD  Multiple Vitamins-Minerals (PRESERVISION AREDS 2) CAPS Take 1 capsule by mouth 2 (two) times daily.    [provider]  nitroGLYCERIN (NITRODUR - DOSED IN MG/24 HR) 0.2 mg/hr patch Place 1 patch (0.2 mg total) onto the skin daily. Alternates patch placement with odd/even days. Patient taking differently: Place 0.2 mg onto the skin daily.  11/22/17   [place]ique, Peter M, MD  pantoprazole  (PROTONIX) 40 MG tablet Take 1 tablet (40 mg total) by mouth 2 (two) times daily. Patient taking differently: Take 40 mg by mouth daily.  10/16/17   Regalado, Belkys A, MD  pravastatin (PRAVACHOL) 40 MG tablet Take 1 tablet (40 mg total) by mouth at bedtime. 05/26/17   [place], Peter M, MD  SPIRIVA HANDIHALER 18 MCG inhalation capsule INHALE CONTENTS OF 1 CAPSULE VIA HANDIHALER ONCE DAILY 12/27/17   Juanito Doom,  MD  SYMBICORT 160-4.5 MCG/ACT inhaler INHALE 2 PUFFS BY MOUTH TWICE DAILY Patient taking differently: Inhale 2 puffs into the lungs two times a day 07/31/17   Juanito Doom, MD    Family History Family History  Problem Relation Age of Onset  . Stroke Father        Mother history unknown - never met her  . Venous thrombosis Sister   . Colon cancer Neg Hx     Social History Social History   Tobacco Use  . Smoking status: Former Smoker    Packs/day: 1.00    Years: 35.00    Pack years: 35.00    Types: Cigarettes    Last attempt to quit: 08/01/1992    Years since quitting: 25.4  . Smokeless tobacco: Former Systems developer    Quit date: 1994  Substance Use Topics  . Alcohol use: No    Alcohol/week: 0.0 oz  . Drug use: No     Allergies   Codeine; Dilaudid [hydromorphone hcl]; Flomax [tamsulosin hcl]; Morphine and related; Sulfa antibiotics; Beta adrenergic blockers; and Carvedilol   Review of Systems Review of Systems  Constitutional: Positive for chills and fever.  HENT: Negative for congestion and rhinorrhea.   Respiratory: Negative for shortness of breath.   Cardiovascular: Negative for chest pain, palpitations and leg swelling.  Gastrointestinal: Negative for abdominal pain, nausea and vomiting.  Genitourinary: Negative for difficulty urinating and dysuria.  Musculoskeletal: Negative for back pain and myalgias.  Skin: Negative for rash.  Neurological: Positive for weakness and light-headedness. Negative for speech difficulty and headaches.       Generalized  weakness  Psychiatric/Behavioral: Positive for confusion.     Physical Exam Updated Vital Signs BP (!) 99/50 (BP Location: Right Arm)   Pulse 88   Temp 99.5 F (37.5 C) (Oral)   Resp 16   Ht 5' 8" (1.727 m)   Wt 70.8 kg (156 lb)   SpO2 97%   BMI 23.72 kg/m   Physical Exam  Constitutional: He appears well-developed and well-nourished. No distress.  HENT:  Head: Normocephalic and atraumatic.  Mouth/Throat: Oropharynx is clear and moist.  Eyes: Pupils are equal, round, and reactive to light. Conjunctivae and EOM are normal.  Neck: Normal range of motion. Neck supple.  No meningismus.  No nuchal rigidity.  Cardiovascular: Normal rate, regular rhythm, S1 normal and S2 normal.  No murmur heard. Pulmonary/Chest: Effort normal.  Lung sounds diminished in bilateral lower lung fields.  Abdominal: Soft. He exhibits no distension. There is no tenderness. There is no guarding.  Musculoskeletal: Normal range of motion. He exhibits edema.  Pt has 2+ pitting edema to the level of the tibia tuberosity of the right lower extremity.  Patient reporting this is chronic for him.  Lymphadenopathy:    He has no cervical adenopathy.  Neurological: He is alert.  Mental Status:  Alert, oriented, thought content appropriate, able to give a coherent history. Speech fluent without evidence of aphasia.  Able to follow two-step commands, but does need some redirection.   Cranial Nerves:  II:  Peripheral visual fields grossly normal, pupils equal, round, reactive to light III,IV, VI: ptosis not present, extra-ocular motions intact bilaterally  V,VII: smile symmetric, facial light touch sensation equal VIII: hearing grossly normal to voice  X: uvula elevates symmetrically  XI: bilateral shoulder shrug symmetric and strong XII: midline tongue extension without fassiculations Motor:  Normal tone. 5/5 in upper and lower extremities bilaterally including strong and equal grip strength and  dorsiflexion/plantar flexion Sensory: Pinprick and light touch normal in all extremities.  Deep Tendon Reflexes: 2+ and symmetric in the biceps and patella. Cerebellar: normal finger-to-nose with bilateral upper extremities Gait: normal gait and balance Stance: No pronator drift and good coordination, strength, and position sense with tapping of bilateral arms (performed in sitting position). CV: distal pulses palpable throughout, 1+ in b/l lower extremities and 2+ in b/l upper extremities.  Skin: Skin is warm and dry. No rash noted.  Patient has diffuse ecchymosis on extremities.  No focal rash.  There are no lesions of the palms or the soles.  Psychiatric: He has a normal mood and affect. His behavior is normal. Judgment and thought content normal.  Nursing note and vitals reviewed.    ED Treatments / Results  Labs (all labs ordered are listed, but only abnormal results are displayed) Labs Reviewed  CBG MONITORING, ED - Abnormal; Notable for the following components:      Result Value   Glucose-Capillary 100 (*)    All other components within normal limits  CULTURE, BLOOD (ROUTINE X 2)  CULTURE, BLOOD (ROUTINE X 2)  URINE CULTURE  COMPREHENSIVE METABOLIC PANEL  CBC  CBC WITH DIFFERENTIAL/PLATELET  URINALYSIS, COMPLETE (UACMP) WITH MICROSCOPIC  BLOOD GAS, VENOUS  TROPONIN I  I-STAT CG4 LACTIC ACID, ED    EKG None  Radiology Dg Chest 2 View  Result Date: 01/01/2018 CLINICAL DATA:  Hypoxia EXAM: CHEST - 2 VIEW COMPARISON:  12/19/2017 FINDINGS: Cardiac shadow is stable. Defibrillator is again noted. Lungs are hyper aerated bilaterally. No focal infiltrate or sizable effusion is seen. No acute bony abnormality is noted. IMPRESSION: COPD without acute abnormality. Electronically Signed   By: Inez Catalina M.D.   On: 01/01/2018 16:23   Ct Head Wo Contrast  Result Date: 01/01/2018 CLINICAL DATA:  Worsening mental status with decline. EXAM: CT HEAD WITHOUT CONTRAST TECHNIQUE:  Contiguous axial images were obtained from the base of the skull through the vertex without intravenous contrast. COMPARISON:  02/15/2017 FINDINGS: Brain: Generalized age related atrophy. No evidence of old or acute focal infarction, mass lesion, hemorrhage, hydrocephalus or extra-axial collection. Vascular: There is atherosclerotic calcification of the major vessels at the base of the brain. Skull: Normal Sinuses/Orbits: Clear/normal Other: None IMPRESSION: Age related atrophy. No focal or acute finding. No progressive change visible since last year. Electronically Signed   By: Nelson Chimes M.D.   On: 01/01/2018 16:41    Procedures Procedures (including critical care time)  Medications Ordered in ED Medications - No data to display   Initial Impression / Assessment and Plan / ED Course  I have reviewed the triage vital signs and the nursing notes.  Pertinent labs & imaging results that were available during my care of the patient were reviewed by me and considered in my medical decision making (see chart for details).  Clinical Course as of Jan 01 1758  Mon Jan 01, 2018  1613 Present, patient is not meeting SIRS criteria.  Per patient's son, patient is not on antihypertensives, and that his baseline blood pressure.   [AM]  1745 Elevated troponin noted.  Patient has a history of elevated troponin to 0.03-0.05 with febrile illnesses.  Do not feel that this is an acute MI with presentation.  Will trend.   [AM]  2836 On reassessment, patient son is reporting that patient is not  returning to baseline even after initiation of antibiotics and fluid resuscitation.  He is concerned that patient's clinical status has worsened from  the last febrile illness.   [AM]    Clinical Course User Index [AM] Albesa Seen, PA-C    Patient nontoxic-appearing and in no acute distress at present patient is neurologically intact at present.  Patient has a new oxygen requirement, and is breathing comfortably  on 2 L nasal cannula in emergency department.  Differential diagnosis includes: ICH / Stroke, ACS, Sepsis syndrome, Infection - UTI/Pneumonia, Encephalopathy, Electrolyte abnormality, Drug overdose, DKA, Metabolic disorders including thyroid disorders, adrenal insufficiency, Cancer of unknown origin / paraneoplastic process, Hypercapnia / COPD, Hypoxia.   Work-up thus far significant for leukocytosis of 22 with left shift.  Patient chest x-ray demonstrates no infiltrate or acute cardiopulmonary abnormalities.  EKG is consistent with prior with nonspecific interventricular conduction delay, but no evidence of new ischemia, infarction, arrhythmia.  Urinalysis is clear.  Patient does have a elevated troponin, however his prior presentations have had elevated troponin at the same level, will trend, but do not feel that this is representative ACS given patient's presentation.  Patient's head CT is without acute intracranial abnormality and shows age-related changes.    Favoring infectious etiology at this time due to reported fever at home, and profound leukocytosis.  Unclear etiology, as chest x-ray and urine are clear, skin exam is not revealing for any concerning features.  However differential still includes endocarditis, tickborne illness.  No nuchal rigidity or meningismus to suggest acute meningitis.  Broad-spectrum antibiotics that have resulted in positive clinical improvement for patient in the past were initiated.  Will seek admission for fever of unknown origin.  Spoke with Dr. Roel Cluck of [facility], who will accept the patient for admission.  This is a shared visit with Dr. Daleen Bo. Patient was independently evaluated by this attending physician. Attending physician consulted in evaluation and admission management.  Final Clinical Impressions(s) / ED Diagnoses   Final diagnoses:  Fever, unspecified fever cause  Transient alteration of awareness  Leukocytosis, unspecified type      ED Discharge Orders    None       Tamala Julian 01/01/18 1908    Tamala Julian 01/01/18 1951    Daleen Bo, MD 01/09/18 [phone]

## 2018-01-01 NOTE — Telephone Encounter (Signed)
OK.  Can we get him in for a hospital follow up (me or NP) in 1-2 weeks?

## 2018-01-01 NOTE — ED Notes (Signed)
Patient transported to CT 

## 2018-01-01 NOTE — Telephone Encounter (Signed)
Called and spoke to patient's son. He wanted to let Dr. Lake Bells know what is going on with his father. Son stated that he had gone over to pick his father up for a hospital follow up appointment as he had been in the hospital 13 days ago. Son stated that his father was experiencing some confusion and having difficulty putting on his socks. He called 911. Vitals per son were: blood pressure 110/58, pulse 84, CBG 100, oxygen sats 88%. Son stated he placed oxygen on his dad and when EMS arrived they gave him a breathing treatment.  He just wants to pass the information to Dr. Lake Bells as Juluis Rainier.

## 2018-01-01 NOTE — Progress Notes (Signed)
Pharmacy Antibiotic Note  Antonio Ray is a 84 y.o. male admitted on 01/01/2018 with fever of unknown origin.  Pharmacy has been consulted for Vancomycin and Zosyn dosing. Patient has history of recurrent aspiration pneumonias, CHF, COPD, CKD stage III, and DM.   WBC 22, Scr 1.45 with estimated CrCl ~31.7 mL/min. Tmax 100.2. Lactate <2.   Plan: Vancomycin 1250 mg IV x1 then 1000mg  IV every 24 hours.  Zosyn 3.375g IV x1 now then 3.375g IV every 8 hours - 4 hour infusion.  Height: 5\' 8"  (172.7 cm) Weight: 156 lb (70.8 kg) IBW/kg (Calculated) : 68.4  Temp (24hrs), Avg:99.5 F (37.5 C), Min:99.5 F (37.5 C), Max:99.5 F (37.5 C)  Recent Labs  Lab 01/01/18 1553 01/01/18 1555  WBC 22.0*  --   LATICACIDVEN  --  1.45    Estimated Creatinine Clearance: 31.7 mL/min (A) (by C-G formula based on SCr of 1.74 mg/dL (H)).    Allergies  Allergen Reactions  . Codeine Other (See Comments)    Gets very angry, disoriented  . Dilaudid [Hydromorphone Hcl] Other (See Comments)    VERY AGITATED, HOSTILE  . Flomax [Tamsulosin Hcl] Shortness Of Breath  . Morphine And Related Other (See Comments)    VERY AGITATED, HOSTILE  . Sulfa Antibiotics Shortness Of Breath  . Beta Adrenergic Blockers Other (See Comments)    Disorientation  . Carvedilol Other (See Comments)    DISORIENTATION    Antimicrobials this admission: Vancomycin 6/3 >> Zosyn 6/3 >>  Dose adjustments this admission:   Microbiology results: 6/3 BCx:  6/3 UCx:   Thank you for allowing pharmacy to be a part of this patient's care.  Sloan Leiter, PharmD, BCPS, BCCCP Clinical Pharmacist Clinical phone 01/01/2018 until 3:30PM [phone] After hours, please call [phone] 01/01/2018 4:43 PM

## 2018-01-01 NOTE — ED Notes (Signed)
ED Provider at bedside. 

## 2018-01-01 NOTE — ED Notes (Signed)
Patient transported to X-ray 

## 2018-01-01 NOTE — ED Triage Notes (Signed)
Pt here from home after son reported patient has altered mental status. Per son, patient is not able to perform ADLs and is not at baseline. Pt felt febrile to son. Son gave him 1000mg  tylenol at 1400. Pt is not on O2 at home and RA O2 sat 94%. 96% on 3L O2. Given 5mg  albuterol neb by EMS.

## 2018-01-01 NOTE — ED Provider Notes (Signed)
  Face-to-face evaluation   History: He presents for evaluation of period of disorientation which started after he ate breakfast this morning.  His typically brings it to him every morning around 7:00 and he was well then.  His son found him at about 1:00 when he was getting ready to take him to his PCP appointment.  At that time he had a tactile fever and was treated with Tylenol.  His son was concerned that he was wheezing, so he was given a nebulizer.  His sats were little bit low but improved when he is on 4 L of nasal cannula oxygen at home.  Admitted about 10 days ago with fever, discharged without diagnostic entity 3 days later.  Concern for aspiration with intermittent pneumonitis, with similar episodes in the past.  Physical exam: Alert, calm, cooperative.  Elderly.  Skin is warm to touch.  Heart regular rate and rhythm without murmur.  Lungs with decreased air movement left base with few audible wheezes.  There is no respiratory distress.  Extremities without significant edema.  Medical screening examination/treatment/procedure(s) were conducted as a shared visit with non-physician practitioner(s) and myself.  I personally evaluated the patient during the encounter    Daleen Bo, MD 01/09/18 [phone]

## 2018-01-01 NOTE — Progress Notes (Deleted)
ANTIBIOTIC CONSULT NOTE - INITIAL  Pharmacy Consult for vancomycin and zosyn Indication: sepsis  Allergies  Allergen Reactions  . Codeine Other (See Comments)    Gets very angry, disoriented  . Dilaudid [Hydromorphone Hcl] Other (See Comments)    VERY AGITATED, HOSTILE  . Flomax [Tamsulosin Hcl] Shortness Of Breath  . Morphine And Related Other (See Comments)    VERY AGITATED, HOSTILE  . Sulfa Antibiotics Shortness Of Breath  . Beta Adrenergic Blockers Other (See Comments)    Disorientation  . Carvedilol Other (See Comments)    DISORIENTATION    Patient Measurements: Height: 5\' 8"  (172.7 cm) Weight: 156 lb (70.8 kg) IBW/kg (Calculated) : 68.4   Vital Signs: Temp: 97.9 F (36.6 C) (06/03 2143) Temp Source: Rectal (06/03 1706) BP: 99/50 (06/03 2143) Pulse Rate: 62 (06/03 2143) Intake/Output from previous day: No intake/output data recorded. Intake/Output from this shift: Total I/O In: 250 [IV Piggyback:250] Out: -   Labs: Recent Labs    01/01/18 1544 01/01/18 1553  WBC  --  22.0*  HGB  --  12.0*  PLT  --  267  CREATININE 1.99*  --    Estimated Creatinine Clearance: 27.7 mL/min (A) (by C-G formula based on SCr of 1.99 mg/dL (H)). No results for input(s): VANCOTROUGH, VANCOPEAK, VANCORANDOM, GENTTROUGH, GENTPEAK, GENTRANDOM, TOBRATROUGH, TOBRAPEAK, TOBRARND, AMIKACINPEAK, AMIKACINTROU, AMIKACIN in the last 72 hours.   Microbiology: Recent Results (from the past 720 hour(s))  Blood culture (routine x 2)     Status: None   Collection Time: 12/19/17  3:15 PM  Result Value Ref Range Status   Specimen Description BLOOD BLOOD RIGHT FOREARM  Final   Special Requests   Final    BOTTLES DRAWN AEROBIC AND ANAEROBIC Blood Culture adequate volume   Culture   Final    NO GROWTH 5 DAYS Performed at [facility], [address] [place], [place] [address]    Report Status 12/24/2017 FINAL  Final  Blood culture (routine x 2)     Status: None   Collection Time:  12/19/17  3:19 PM  Result Value Ref Range Status   Specimen Description BLOOD LEFT HAND  Final   Special Requests   Final    BOTTLES DRAWN AEROBIC AND ANAEROBIC Blood Culture adequate volume   Culture   Final    NO GROWTH 5 DAYS Performed at [facility], [place] [address], [place], [place] [address]    Report Status 12/24/2017 FINAL  Final  Urine culture     Status: None   Collection Time: 12/19/17  4:05 PM  Result Value Ref Range Status   Specimen Description URINE, CLEAN CATCH  Final   Special Requests NONE  Final   Culture   Final    NO GROWTH Performed at [facility], [place] [address], [place], [place] [address]    Report Status 12/20/2017 FINAL  Final  Culture, sputum-assessment     Status: None   Collection Time: 12/21/17  3:43 PM  Result Value Ref Range Status   Specimen Description EXPECTORATED SPUTUM  Final   Special Requests NONE  Final   Sputum evaluation   Final    THIS SPECIMEN IS ACCEPTABLE FOR SPUTUM CULTURE Performed at [facility], [place] [address] [place], [place] [address]    Report Status 12/21/2017 FINAL  Final  Culture, respiratory (NON-Expectorated)     Status: None   Collection Time: 12/21/17  3:43 PM  Result Value Ref Range Status  Specimen Description EXPECTORATED SPUTUM  Final   Special Requests NONE Reflexed from [id number]  Final   Gram Stain   Final    ABUNDANT WBC PRESENT,BOTH PMN AND MONONUCLEAR MODERATE GRAM POSITIVE COCCI RARE GRAM VARIABLE ROD FEW YEAST    Culture   Final    Consistent with normal respiratory flora. Performed at [facility], [place] [address], [place], [place] [address]    Report Status 12/24/2017 FINAL  Final    Medical History: Past Medical History:  Diagnosis Date  . Achalasia   . AICD (automatic cardioverter/defibrillator) present 03/30/2011   Analyze ST study patient  . Anginal pain ([place])   . BPH (benign prostatic hyperplasia)   . CHF (congestive heart failure) ([place])   . Chronic systolic  dysfunction of left ventricle    EF 30-35%, CLASS II - III SYMPTOMS; intolerant to Coreg  . CKD (chronic kidney disease) stage 3, GFR 30-59 ml/min ([name]) 06/08/2012  . COPD (chronic obstructive pulmonary disease) ([place])   . Coronary artery disease    History of remote anterior MI with PCI to LAD in 2006; most recent cath 2007, no intervention required  . DOE (dyspnea on exertion)    with heavy exertion  . Dysrhythmia   . Esophageal dysmotility   . GERD (gastroesophageal reflux disease)   . GERD (gastroesophageal reflux disease)   . Head injury, closed, with concussion 2000ish  . Heart murmur    hx  . HOH (hard of hearing)   . Hyperlipidemia   . Memory loss    improved  . Myocardial infarction ([place]) 1994   ANTERIOR  . Pneumonia "several times"   aspiration pna with at least 3 admits for this 2016.   Marland Kitchen PVC's (premature ventricular contractions)   . Renal failure   . Skin cancer "several"   "forearms; head"  . Type II diabetes mellitus [facility])      Assessment: 84 yo male admitted with sepsis. Pharmacy has been asked to dose vancomycin and zosyn.   Goal of Therapy:  Vancomycin troughs 15-51mcg/ml  Plan:  Vancomycin 750mg  IV q24h Zosyn 3.375gm IV q8h   Shericka Johnstone A. Levada Dy, PharmD, [place] Pager: [phone]  01/01/2018,10:25 PM

## 2018-01-01 NOTE — H&P (Addendum)
Antonio Ray IOX:[id number] DOB: 1934-05-22 DOA: 01/01/2018     PCP: [name], MD  [facility]:  CARDS: Dr. [place] [facility]   Dr. Curt Jews [facility] [place] Patient arrived to ER on 01/01/18 at 1446  Patient coming from:    home Lives alone,        Chief Complaint:  Chief Complaint  Patient presents with  . Altered Mental Status    HPI: Antonio Ray is a 84 y.o. male with medical history significant of achalasia, recurrent aspiration pneumonitis, chronic systolic heart failure, COPD chronic kidney disease stage III, DM2, recurrent admissions for acute encephalopathy and SIRS    Presented with   confusion  Could not operate a telephone to call family, had a bit of the chill in AM. with possible fever son gave patient Tylenol he checked his oxygen saturation was noted to be 88%  on room air.  He was wheezing slightly patient appeared to be confused and had difficulty putting on his socks they were getting ready to take him to pulmonology appointment for follow-up after recent hospitalization.  Given altered mental status EMS was called Family started on 3 L which point he was satting 96% and gave 5 mg of albuterol patient at baseline not on oxygen.  On EMS arrival blood pressure 110/58 pulse 84 CBG was 100.  Patient has been admitted 2 weeks ago for similar presentation was hospitalized from 21st-24th with acute mental status changes and fever he was treated for broad-spectrum antibiotics but no source was found he became afebrile and was able to be discharged to home.  No recent steroids, no hx of tick bites. He goes on the walks every day,  He seemed at baseline yesterday  BP slightly below baseline family states recently have been  Regarding pertinent Chronic problems: Known history of chronic systolic heart failure EF 20-25% per last echogram on October 15, 2017 Patient has mild dementia at baseline Aricept and Namenda Patient has had recent recurrent  admissions and febrile illnesses turning since March 2018 he had aspiration pneumonia in 2019 He also has a history of achalasia and was admitted for healthcare associated pneumonia multiple times in 2016. He underwent Botox injections in his distal esophagus in late 2016 in 2017 he had dilation done by Dr. Silverio Decamp History of COPD followed by pulmonology  While in ER: Sepsis protocol activated patient start on broad-spectrum antibiotics vancomycin and Zosyn  Following Medications were ordered in ER: Medications  vancomycin (VANCOCIN) 1,250 mg in sodium chloride 0.9 % 250 mL IVPB (1,250 mg Intravenous New Bag/Given 01/01/18 1804)  vancomycin (VANCOCIN) IVPB 1000 mg/200 mL premix (has no administration in time range)  piperacillin-tazobactam (ZOSYN) IVPB 3.375 g (has no administration in time range)  sodium chloride 0.9 % bolus 1,000 mL (0 mLs Intravenous Stopped 01/01/18 1818)  piperacillin-tazobactam (ZOSYN) IVPB 3.375 g (0 g Intravenous Stopped 01/01/18 1834)    Significant initial  Findings: Abnormal Labs Reviewed  COMPREHENSIVE METABOLIC PANEL - Abnormal; Notable for the following components:      Result Value   BUN 36 (*)    Creatinine, Ser 1.99 (*)    Albumin 3.0 (*)    ALT 15 (*)    GFR calc non Af Amer 30 (*)    GFR calc Af Amer 34 (*)    All other components within normal limits  CBC WITH DIFFERENTIAL/PLATELET - Abnormal; Notable for the following components:   WBC 22.0 (*)    Hemoglobin 12.0 (*)  Neutro Abs 18.8 (*)    All other components within normal limits  TROPONIN I - Abnormal; Notable for the following components:   Troponin I 0.04 (*)    All other components within normal limits  CBG MONITORING, ED - Abnormal; Notable for the following components:   Glucose-Capillary 100 (*)    All other components within normal limits  I-STAT VENOUS BLOOD GAS, ED - Abnormal; Notable for the following components:   pO2, Ven 112.0 (*)    Bicarbonate 29.3 (*)    Acid-Base Excess  3.0 (*)    All other components within normal limits   Ph 7.345/53.8/112  LACTIC ACID 1.45  Na 142 K 4.3  Cr    Up from baseline see below Lab Results  Component Value Date   CREATININE 1.99 (H) 01/01/2018   CREATININE 1.74 (H) 12/21/2017   CREATININE 1.87 (H) 12/20/2017    HG/HCT  Stable,     Component Value Date/Time   HGB 12.0 (L) 01/01/2018 1553   HCT 39.3 01/01/2018 1553   tROP 0.04 chronically up    BNP (last 3 results) Recent Labs    02/02/17 0820 10/13/17 1401  BNP 108.5* 398.4*    ProBNP (last 3 results) No results for input(s): PROBNP in the last 8760 hours.  Lactic Acid, Venous    Component Value Date/Time   LATICACIDVEN 1.45 01/01/2018 1555      UA   no evidence of UTI   CT HEAD   NON acute  CXR -   NON acute, copd    ECG:  Personally reviewed by me showing: HR : 91 Rhythm:  LBBB,  Ischemic changes N/A QTC 480     ED Triage Vitals  Enc Vitals Group     BP 01/01/18 1502 (!) 99/50     Pulse Rate 01/01/18 1502 88     Resp 01/01/18 1502 16     Temp 01/01/18 1502 99.5 F (37.5 C)     Temp Source 01/01/18 1502 Oral     SpO2 01/01/18 1448 (!) 88 %     Weight 01/01/18 1542 156 lb (70.8 kg)     Height 01/01/18 1542 _0  (1.727 m)     Head Circumference --      Peak Flow --      Pain Score 01/01/18 1500 0     Pain Loc --      Pain Edu? --      Excl. in [place]? --   TMAX(24)@       Latest  Blood pressure (!) 101/53, pulse 81, temperature 99 F (37.2 C), temperature source Rectal, resp. rate (!) 23, height _1  (1.727 m), weight 70.8 kg (156 lb), SpO2 96 %.    Hospitalist was called for admission for acute encephalopathy  in the setting of  SIRS  Review of Systems:    Pertinent positives include:  Fevers, chills, fatigue shortness of breath at rest. wheezing.confusion Constitutional:  No weight loss, night sweats,, weight loss  HEENT:  No headaches, Difficulty swallowing,Tooth/dental problems,Sore throat,  No sneezing, itching,  ear ache, nasal congestion, post nasal drip,  Cardio-vascular:  No chest pain, Orthopnea, PND, anasarca, dizziness, palpitations.no Bilateral lower extremity swelling  GI:  No heartburn, indigestion, abdominal pain, nausea, vomiting, diarrhea, change in bowel habits, loss of appetite, melena, blood in stool, hematemesis Resp:  no No dyspnea on exertion, No excess mucus, no productive cough, No non-productive cough, No coughing up of blood.No change in color of mucus.No  Skin:  no rash or lesions. No jaundice GU:  no dysuria, change in color of urine, no urgency or frequency. No straining to urinate.  No flank pain.  Musculoskeletal:  No joint pain or no joint swelling. No decreased range of motion. No back pain.  Psych:  No change in mood or affect. No depression or anxiety. No memory loss.  Neuro: no localizing neurological complaints, no tingling, no weakness, no double vision, no gait abnormality, no slurred speech, no   As per HPI otherwise 10 point review of systems negative.   Past Medical History:   Past Medical History:  Diagnosis Date  . Achalasia   . AICD (automatic cardioverter/defibrillator) present 03/30/2011   Analyze ST study patient  . Anginal pain ([place])   . BPH (benign prostatic hyperplasia)   . CHF (congestive heart failure) ([place])   . Chronic systolic dysfunction of left ventricle    EF 30-35%, CLASS II - III SYMPTOMS; intolerant to Coreg  . CKD (chronic kidney disease) stage 3, GFR 30-59 ml/min ([name]) 06/08/2012  . COPD (chronic obstructive pulmonary disease) ([place])   . Coronary artery disease    History of remote anterior MI with PCI to LAD in 2006; most recent cath 2007, no intervention required  . DOE (dyspnea on exertion)    with heavy exertion  . Dysrhythmia   . Esophageal dysmotility   . GERD (gastroesophageal reflux disease)   . GERD (gastroesophageal reflux disease)   . Head injury, closed, with concussion 2000ish  . Heart murmur    hx  . HOH (hard  of hearing)   . Hyperlipidemia   . Memory loss    improved  . Myocardial infarction ([place]) 1994   ANTERIOR  . Pneumonia "several times"   aspiration pna with at least 3 admits for this 2016.   Marland Kitchen PVC's (premature ventricular contractions)   . Renal failure   . Skin cancer "several"   "forearms; head"  . Type II diabetes mellitus ([place])       Past Surgical History:  Procedure Laterality Date  . BALLOON DILATION N/A 09/09/2015   Procedure: BALLOON DILATION;  Surgeon: Mauri Pole, MD;  Location: [facility];  Service: Endoscopy;  Laterality: N/A;  rigiflex achalasia balloon dilators  . BOTOX INJECTION N/A 04/08/2015   Procedure: BOTOX INJECTION;  Surgeon: Milus Banister, MD;  Location: [facility];  Service: Endoscopy;  Laterality: N/A;  . CARDIAC CATHETERIZATION  01/11/2006   DEMONSTRATES AKINESIA OF THE DISTAL ANTERIOR WALL, DISTAL INFERIOR WALL AND AKINESIA OF THE APEX. THE BASAL SEGMENTS CONTRACT WELL AND OVERALL EF 35%  . CATARACT EXTRACTION W/ INTRAOCULAR LENS  IMPLANT, BILATERAL  08/2014-09/2014  . CHOLECYSTECTOMY  2/11  . CORONARY ANGIOPLASTY  1994   TO THE LAD  . ESOPHAGEAL MANOMETRY N/A 03/16/2015   Procedure: ESOPHAGEAL MANOMETRY (EM);  Surgeon: Jerene Bears, MD;  Location: [facility];  Service: Gastroenterology;  Laterality: N/A;  . ESOPHAGOGASTRODUODENOSCOPY N/A 04/08/2015   Procedure: ESOPHAGOGASTRODUODENOSCOPY (EGD);  Surgeon: Milus Banister, MD;  Location: [facility];  Service: Endoscopy;  Laterality: N/A;  . ESOPHAGOGASTRODUODENOSCOPY (EGD) WITH PROPOFOL N/A 09/09/2015   Procedure: ESOPHAGOGASTRODUODENOSCOPY (EGD) WITH PROPOFOL;  Surgeon: Mauri Pole, MD;  Location: [facility];  Service: Endoscopy;  Laterality: N/A;  pt. is to have gastrografin xray post recovery-do not discharge until results are back  . EYE SURGERY    . FOOT FRACTURE SURGERY Right 1980's  . INSERT / REPLACE / REMOVE PACEMAKER    . SKIN CANCER  EXCISION  "several"   "forearms, head"     Social History:  Ambulatory  Independently      reports that he quit smoking about 25 years ago. His smoking use included cigarettes. He has a 35.00 pack-year smoking history. He quit smokeless tobacco use about 25 years ago. He reports that he does not drink alcohol or use drugs.     Family History:   Family History  Problem Relation Age of Onset  . Stroke Father        Mother history unknown - never met her  . Venous thrombosis Sister   . Colon cancer Neg Hx     Allergies: Allergies  Allergen Reactions  . Codeine Other (See Comments)    Gets very angry, disoriented  . Dilaudid [Hydromorphone Hcl] Other (See Comments)    VERY AGITATED, HOSTILE  . Flomax [Tamsulosin Hcl] Shortness Of Breath  . Morphine And Related Other (See Comments)    VERY AGITATED, HOSTILE  . Sulfa Antibiotics Shortness Of Breath  . Beta Adrenergic Blockers Other (See Comments)    Disorientation  . Carvedilol Other (See Comments)    DISORIENTATION     Prior to Admission medications   Medication Sig Start Date End Date Taking? Authorizing Provider  albuterol (PROVENTIL HFA;VENTOLIN HFA) 108 (90 Base) MCG/ACT inhaler Inhale 2 puffs into the lungs every 6 (six) hours as needed for wheezing or shortness of breath. 11/16/17   Juanito Doom, MD  aspirin 81 MG EC tablet Take 81 mg by mouth daily.      [provider]  Cholecalciferol (VITAMIN D) 2000 units tablet Take 2,000 Units by mouth daily.     [provider]  donepezil (ARICEPT) 10 MG tablet Take 1 tablet (10 mg total) by mouth every morning. 05/01/17   Marcial Pacas, MD  fenofibrate (TRICOR) 145 MG tablet TAKE 1 TABLET BY MOUTH EVERY DAY Patient taking differently: Take 145 mg by mouth once a day 02/10/17   [place], Peter M, MD  finasteride (PROSCAR) 5 MG tablet Take 5 mg by mouth daily. 05/19/17   [provider]  furosemide (LASIX) 20 MG tablet Take 1 tablet (20 mg total) by mouth daily. 10/16/17   Regalado, Belkys A,  MD  insulin aspart (NOVOLOG) 100 UNIT/ML injection Inject 4-10 Units into the skin 3 (three) times daily before meals.    [provider]  Insulin Glargine (BASAGLAR KWIKPEN) 100 UNIT/ML SOPN INJECT 5 UNITS UNDER THE SKIN EVERY MORNING Patient taking differently: Inject 8 Units into the skin every morning.  10/16/17   Regalado, Belkys A, MD  loratadine (CLARITIN) 10 MG tablet Take 1 tablet (10 mg total) by mouth daily. 07/02/15   Barton Dubois, MD  memantine (NAMENDA) 10 MG tablet Take 1 tablet (10 mg total) by mouth 2 (two) times daily. 05/01/17   Marcial Pacas, MD  Multiple Vitamins-Minerals (PRESERVISION AREDS 2) CAPS Take 1 capsule by mouth 2 (two) times daily.    [provider]  nitroGLYCERIN (NITRODUR - DOSED IN MG/24 HR) 0.2 mg/hr patch Place 1 patch (0.2 mg total) onto the skin daily. Alternates patch placement with odd/even days. Patient taking differently: Place 0.2 mg onto the skin daily.  11/22/17   [place], Peter M, MD  pantoprazole (PROTONIX) 40 MG tablet Take 1 tablet (40 mg total) by mouth 2 (two) times daily. Patient taking differently: Take 40 mg by mouth daily.  10/16/17   Regalado, Belkys A, MD  pravastatin (PRAVACHOL) 40 MG tablet Take 1 tablet (  40 mg total) by mouth at bedtime. 05/26/17   [place], Peter M, MD  SPIRIVA HANDIHALER 18 MCG inhalation capsule INHALE CONTENTS OF 1 CAPSULE VIA HANDIHALER ONCE DAILY 12/27/17   Juanito Doom, MD  SYMBICORT 160-4.5 MCG/ACT inhaler INHALE 2 PUFFS BY MOUTH TWICE DAILY Patient taking differently: Inhale 2 puffs into the lungs two times a day 07/31/17   Juanito Doom, MD   Physical Exam: Blood pressure (!) 101/53, pulse 81, temperature 99 F (37.2 C), temperature source Rectal, resp. rate (!) 23, height _0  (1.727 m), weight 70.8 kg (156 lb), SpO2 96 %. 1. General:  in No Acute distress  Chronically ill  -appearing 2. Psychological: Alert and   Oriented to self 3. Head/ENT:   Dry Mucous Membranes                           Head Non traumatic, neck supple                          Poor Dentition 4. SKIN:  decreased Skin turgor,  Skin clean Dry and intact no rash 5. Heart: Regular rate and rhythm no  Murmur, no Rub or gallop 6. Lungs:occasional inspiratory squeak  No wheezes or crackles   7. Abdomen: Soft,  non-tender, Non distended   obese  present 8. Lower extremities: no clubbing, cyanosis, or edema 9. Neurologically Grossly intact, moving all 4 extremities equally  10. MSK: Normal range of motion   LABS:     Recent Labs  Lab 01/01/18 1553  WBC 22.0*  NEUTROABS 18.8*  HGB 12.0*  HCT 39.3  MCV 89.5  PLT 427   Basic Metabolic Panel: Recent Labs  Lab 01/01/18 1544  NA 142  K 4.3  CL 105  CO2 27  GLUCOSE 99  BUN 36*  CREATININE 1.99*  CALCIUM 9.2      Recent Labs  Lab 01/01/18 1544  AST 27  ALT 15*  ALKPHOS 38  BILITOT 0.7  PROT 6.7  ALBUMIN 3.0*   No results for input(s): LIPASE, AMYLASE in the last 168 hours. No results for input(s): AMMONIA in the last 168 hours.    HbA1C: No results for input(s): HGBA1C in the last 72 hours. CBG: Recent Labs  Lab 01/01/18 1538  GLUCAP 100*      Urine analysis:    Component Value Date/Time   COLORURINE YELLOW 01/01/2018 [place] 01/01/2018 1705   LABSPEC 1.014 01/01/2018 1705   PHURINE 5.0 01/01/2018 1705   GLUCOSEU NEGATIVE 01/01/2018 1705   HGBUR NEGATIVE 01/01/2018 1705   BILIRUBINUR NEGATIVE 01/01/2018 1705   BILIRUBINUR neg 06/06/2012 1037   KETONESUR NEGATIVE 01/01/2018 1705   PROTEINUR NEGATIVE 01/01/2018 1705   UROBILINOGEN 0.2 05/12/2015 0920   NITRITE NEGATIVE 01/01/2018 1705   LEUKOCYTESUR NEGATIVE 01/01/2018 1705      Cultures:    Component Value Date/Time   SDES EXPECTORATED SPUTUM 12/21/2017 1543   SDES EXPECTORATED SPUTUM 12/21/2017 1543   SPECREQUEST NONE 12/21/2017 1543   SPECREQUEST NONE Reflexed from [id number] 12/21/2017 1543   CULT  12/21/2017 1543    Consistent with normal  respiratory flora. Performed at [facility], [place] [address] [place], [place] [address]    REPTSTATUS 12/21/2017 FINAL 12/21/2017 1543   REPTSTATUS 12/24/2017 FINAL 12/21/2017 1543     Radiological Exams on Admission: Dg Chest 2 View  Result Date: 01/01/2018 CLINICAL DATA:  Hypoxia EXAM: CHEST - 2 VIEW COMPARISON:  12/19/2017 FINDINGS: Cardiac shadow is stable. Defibrillator is again noted. Lungs are hyper aerated bilaterally. No focal infiltrate or sizable effusion is seen. No acute bony abnormality is noted. IMPRESSION: COPD without acute abnormality. Electronically Signed   By: Inez Catalina M.D.   On: 01/01/2018 16:23   Ct Head Wo Contrast  Result Date: 01/01/2018 CLINICAL DATA:  Worsening mental status with decline. EXAM: CT HEAD WITHOUT CONTRAST TECHNIQUE: Contiguous axial images were obtained from the base of the skull through the vertex without intravenous contrast. COMPARISON:  02/15/2017 FINDINGS: Brain: Generalized age related atrophy. No evidence of old or acute focal infarction, mass lesion, hemorrhage, hydrocephalus or extra-axial collection. Vascular: There is atherosclerotic calcification of the major vessels at the base of the brain. Skull: Normal Sinuses/Orbits: Clear/normal Other: None IMPRESSION: Age related atrophy. No focal or acute finding. No progressive change visible since last year. Electronically Signed   By: Nelson Chimes M.D.   On: 01/01/2018 16:41    Chart has been reviewed    Assessment/Plan  84 y.o. male with medical history significant of achalasia, recurrent aspiration pneumonitis, chronic systolic heart failure, COPD chronic kidney disease stage III, DM2, recurrent admissions for acute encephalopathy and SIRS  Admitted for SIRS  Present on Admission: . Acute encephalopathy -   - most likely multifactorial secondary to combination of  infection   mild dehydration secondary to decreased by mouth intake, mild hypoxia  - Will rehydrate   - treat  underlining infections     -  improvement noted  . Acute respiratory failure with hypoxia ([name]) -no evidence of pneumonia patient does have long-standing history of COPD and prior CT with some sort of atypical infection.  Will repeat CT discussed with pulmonology will see him in consult in a.m. at this point no chest pain shortness of breath back to baseline . BPH (benign prostatic hyperplasia) stable given soft blood pressures we will hold Proscar . SIRS (systemic inflammatory response syndrome) ([name]) obtain sputum and blood cultures at this point no source evident.  Will order respiratory panel and sputum for AFB to evaluate for MAC.  No known no known risk factors for TB . Chronic kidney disease, stage III (moderate) ([name]) currently at baseline avoid nephrotoxic medications  . Chronic systolic CHF (congestive heart failure) ([name]) at this point appears to be somewhat dry will hold Lasix restart when able  . Esophageal dysmotilities - achalasia,  CT worrisome for esophageal dilatation up to 6 cm with solid food. Probably will need GI consult in AM to see if needs  Dilatation. Make NPO for now . S/P ICD (internal cardiac defibrillator) procedure-  stable DM 2-  - Order Sensitive  SSI   - continue home insulin  Decrease to 5 units given soft low sugars,  -  check TSH and HgA1C  - Hold by mouth medications   Essential hypertension given soft blood pressures we will hold home medications for now  Mild dehydration will rehydrate    Other plan as per orders.  DVT prophylaxis:   Lovenox     Code Status:  FULL CODE    as per patient   I had personally discussed CODE STATUS with patient and family  Family Communication:   Family  at  Bedside  plan of care was discussed with  Son, Daughter  Disposition Plan:      To home once workup is complete and patient is stable  Would benefit from PT/OT eval prior to DC ordered                                                Consults called:  Pulmonology   Admission status:    obs   Level of care   tele              Antonio Ray 01/01/2018, 9:04 PM    [facility]  Pager [phone]   after 2 AM please page floor coverage PA If 7AM-7PM, please contact the day team taking care of the patient  [email or web address]  Password [email or web address]

## 2018-01-02 ENCOUNTER — Encounter (HOSPITAL_COMMUNITY): Payer: Self-pay | Admitting: Physician Assistant

## 2018-01-02 DIAGNOSIS — R1310 Dysphagia, unspecified: Secondary | ICD-10-CM

## 2018-01-02 DIAGNOSIS — Z8679 Personal history of other diseases of the circulatory system: Secondary | ICD-10-CM | POA: Diagnosis not present

## 2018-01-02 DIAGNOSIS — H9190 Unspecified hearing loss, unspecified ear: Secondary | ICD-10-CM | POA: Diagnosis present

## 2018-01-02 DIAGNOSIS — K219 Gastro-esophageal reflux disease without esophagitis: Secondary | ICD-10-CM | POA: Diagnosis present

## 2018-01-02 DIAGNOSIS — I5043 Acute on chronic combined systolic (congestive) and diastolic (congestive) heart failure: Secondary | ICD-10-CM | POA: Diagnosis not present

## 2018-01-02 DIAGNOSIS — R1312 Dysphagia, oropharyngeal phase: Secondary | ICD-10-CM

## 2018-01-02 DIAGNOSIS — K220 Achalasia of cardia: Secondary | ICD-10-CM

## 2018-01-02 DIAGNOSIS — E860 Dehydration: Secondary | ICD-10-CM | POA: Diagnosis present

## 2018-01-02 DIAGNOSIS — I255 Ischemic cardiomyopathy: Secondary | ICD-10-CM | POA: Diagnosis present

## 2018-01-02 DIAGNOSIS — J690 Pneumonitis due to inhalation of food and vomit: Secondary | ICD-10-CM | POA: Diagnosis not present

## 2018-01-02 DIAGNOSIS — R404 Transient alteration of awareness: Secondary | ICD-10-CM | POA: Diagnosis not present

## 2018-01-02 DIAGNOSIS — G9340 Encephalopathy, unspecified: Secondary | ICD-10-CM | POA: Diagnosis not present

## 2018-01-02 DIAGNOSIS — N183 Chronic kidney disease, stage 3 (moderate): Secondary | ICD-10-CM | POA: Diagnosis not present

## 2018-01-02 DIAGNOSIS — I252 Old myocardial infarction: Secondary | ICD-10-CM | POA: Diagnosis not present

## 2018-01-02 DIAGNOSIS — Y95 Nosocomial condition: Secondary | ICD-10-CM | POA: Diagnosis present

## 2018-01-02 DIAGNOSIS — I5022 Chronic systolic (congestive) heart failure: Secondary | ICD-10-CM | POA: Diagnosis not present

## 2018-01-02 DIAGNOSIS — I130 Hypertensive heart and chronic kidney disease with heart failure and stage 1 through stage 4 chronic kidney disease, or unspecified chronic kidney disease: Secondary | ICD-10-CM | POA: Diagnosis present

## 2018-01-02 DIAGNOSIS — R402413 Glasgow coma scale score 13-15, at hospital admission: Secondary | ICD-10-CM | POA: Diagnosis present

## 2018-01-02 DIAGNOSIS — K228 Other specified diseases of esophagus: Secondary | ICD-10-CM | POA: Diagnosis not present

## 2018-01-02 DIAGNOSIS — I5042 Chronic combined systolic (congestive) and diastolic (congestive) heart failure: Secondary | ICD-10-CM | POA: Diagnosis not present

## 2018-01-02 DIAGNOSIS — E785 Hyperlipidemia, unspecified: Secondary | ICD-10-CM | POA: Diagnosis present

## 2018-01-02 DIAGNOSIS — E1122 Type 2 diabetes mellitus with diabetic chronic kidney disease: Secondary | ICD-10-CM | POA: Diagnosis present

## 2018-01-02 DIAGNOSIS — J449 Chronic obstructive pulmonary disease, unspecified: Secondary | ICD-10-CM | POA: Diagnosis not present

## 2018-01-02 DIAGNOSIS — Z87891 Personal history of nicotine dependence: Secondary | ICD-10-CM | POA: Diagnosis not present

## 2018-01-02 DIAGNOSIS — R6510 Systemic inflammatory response syndrome (SIRS) of non-infectious origin without acute organ dysfunction: Secondary | ICD-10-CM | POA: Diagnosis present

## 2018-01-02 DIAGNOSIS — R419 Unspecified symptoms and signs involving cognitive functions and awareness: Secondary | ICD-10-CM | POA: Diagnosis present

## 2018-01-02 DIAGNOSIS — D631 Anemia in chronic kidney disease: Secondary | ICD-10-CM | POA: Diagnosis present

## 2018-01-02 DIAGNOSIS — J9601 Acute respiratory failure with hypoxia: Secondary | ICD-10-CM

## 2018-01-02 DIAGNOSIS — F0390 Unspecified dementia without behavioral disturbance: Secondary | ICD-10-CM | POA: Diagnosis present

## 2018-01-02 LAB — URINE CULTURE: CULTURE: NO GROWTH

## 2018-01-02 LAB — CBC
HEMATOCRIT: 34.7 % — AB (ref 39.0–52.0)
HEMOGLOBIN: 10.4 g/dL — AB (ref 13.0–17.0)
MCH: 27.2 pg (ref 26.0–34.0)
MCHC: 30 g/dL (ref 30.0–36.0)
MCV: 90.6 fL (ref 78.0–100.0)
Platelets: 225 10*3/uL (ref 150–400)
RBC: 3.83 MIL/uL — ABNORMAL LOW (ref 4.22–5.81)
RDW: 15.8 % — ABNORMAL HIGH (ref 11.5–15.5)
WBC: 16 10*3/uL — ABNORMAL HIGH (ref 4.0–10.5)

## 2018-01-02 LAB — COMPREHENSIVE METABOLIC PANEL
ALBUMIN: 2.4 g/dL — AB (ref 3.5–5.0)
ALT: 15 U/L — ABNORMAL LOW (ref 17–63)
ANION GAP: 6 (ref 5–15)
AST: 21 U/L (ref 15–41)
Alkaline Phosphatase: 33 U/L — ABNORMAL LOW (ref 38–126)
BILIRUBIN TOTAL: 0.7 mg/dL (ref 0.3–1.2)
BUN: 36 mg/dL — ABNORMAL HIGH (ref 6–20)
CHLORIDE: 109 mmol/L (ref 101–111)
CO2: 28 mmol/L (ref 22–32)
Calcium: 8.4 mg/dL — ABNORMAL LOW (ref 8.9–10.3)
Creatinine, Ser: 1.95 mg/dL — ABNORMAL HIGH (ref 0.61–1.24)
GFR calc Af Amer: 35 mL/min — ABNORMAL LOW (ref >60)
GFR calc non Af Amer: 30 mL/min — ABNORMAL LOW (ref >60)
GLUCOSE: 159 mg/dL — AB (ref 65–99)
POTASSIUM: 3.8 mmol/L (ref 3.5–5.1)
SODIUM: 143 mmol/L (ref 135–145)
TOTAL PROTEIN: 5.4 g/dL — AB (ref 6.5–8.1)

## 2018-01-02 LAB — HEMOGLOBIN A1C
Hgb A1c MFr Bld: 6.6 % — ABNORMAL HIGH (ref 4.8–5.6)
Mean Plasma Glucose: 142.72 mg/dL

## 2018-01-02 LAB — GLUCOSE, CAPILLARY
GLUCOSE-CAPILLARY: 68 mg/dL (ref 65–99)
GLUCOSE-CAPILLARY: 124 mg/dL — AB (ref 65–99)
Glucose-Capillary: 81 mg/dL (ref 65–99)
Glucose-Capillary: 83 mg/dL (ref 65–99)
Glucose-Capillary: 106 mg/dL — ABNORMAL HIGH (ref 65–99)

## 2018-01-02 LAB — MAGNESIUM: MAGNESIUM: 2.1 mg/dL (ref 1.7–2.4)

## 2018-01-02 LAB — LACTIC ACID, PLASMA: Lactic Acid, Venous: 1.2 mmol/L (ref 0.5–1.9)

## 2018-01-02 LAB — TSH: TSH: 0.635 u[IU]/mL (ref 0.350–4.500)

## 2018-01-02 LAB — PHOSPHORUS: PHOSPHORUS: 2.8 mg/dL (ref 2.5–4.6)

## 2018-01-02 MED ORDER — DEXTROSE 50 % IV SOLN
INTRAVENOUS | Status: AC
  Administered 2018-01-02: 25 mL
  Filled 2018-01-02: qty 50

## 2018-01-02 MED ORDER — DEXTROSE-NACL 5-0.45 % IV SOLN
INTRAVENOUS | Status: DC
  Administered 2018-01-02 – 2018-01-03 (×3): via INTRAVENOUS

## 2018-01-02 NOTE — Telephone Encounter (Signed)
Patient son Antonio Ray calling back - offered to schedule appt based on the last note from BQ, per son , pt is still in the hospital with no anticipated discharge date. Advised that hospital f/u can be made after patient has been discharged, nothing further needed, pt son can be reached at [phone], if necessary -pr

## 2018-01-02 NOTE — Consult Note (Addendum)
Name: Antonio Ray MRN: [id number] DOB: June 01, 1934    ADMISSION DATE:  01/01/2018 CONSULTATION DATE:  01/02/2018  REFERRING MD : [facility]  CHIEF COMPLAINT: Altered mental status  BRIEF PATIENT DESCRIPTION: Elderly male in no acute distress  SIGNIFICANT EVENTS  6 admissions over the last 12 months  STUDIES:     HISTORY OF PRESENT ILLNESS:   Antonio Ray is an 84 year old male who quit smoking 25 years ago he is been admitted to [facility] 6 times in last 12 months for essentially the same complaints.  He comes in with altered mental status one time felt to be a stroke but was not.  He has know history of achalasia and is status post Botox and dilatation per GI services.  Most likely he is chronically aspirating developed symptoms of fevers and chills and altered mental status and was admitted to the hospital and treated with antibiotics.  He again presents to [facility] 01/01/2018 after having been discharged on 12/22/2017 and presents with symptoms of altered mental status.  Again he has history of recurrent aspiration pneumonitis, chronic systolic heart failure is considered to have significant COPD, chronic kidney disease, he also has an AICD for sick sinus rhythm is been implanted.  Past medical history also includes diabetes mellitus.  He was admitted and started on azithromycin Zosyn and vancomycin for presumed hospital-acquired pneumonia.  His chest x-ray 2 views demonstrate COPD without acute abnormality.  He is followed by Dr. Lake Bells of the pulmonary critical care division [place].  Dr. Anastasia Pall notes expressed concern concerning his esophageal dysmotility.  He has been dilated and had Botox injections in the past.  Swallow eval shows retention of contrast.  Question whether or not he needs PEG to prevent this recurrent aspiration.  Please note he remains to be a full code at this time.  He is a poor historian secondary to his mild dementia and he is essentially turned over his  care to his son.    PAST MEDICAL HISTORY :   has a past medical history of Achalasia, AICD (automatic cardioverter/defibrillator) present (03/30/2011), Anginal pain ([place]), BPH (benign prostatic hyperplasia), CHF (congestive heart failure) ([place]), Chronic systolic dysfunction of left ventricle, CKD (chronic kidney disease) stage 3, GFR 30-59 ml/min ([name]) (06/08/2012), COPD (chronic obstructive pulmonary disease) ([place]), Coronary artery disease, DOE (dyspnea on exertion), Dysrhythmia, Esophageal dysmotility, GERD (gastroesophageal reflux disease), GERD (gastroesophageal reflux disease), Head injury, closed, with concussion (2000ish), Heart murmur, HOH (hard of hearing), Hyperlipidemia, Memory loss, Myocardial infarction ([place]) (1994), Pneumonia ("several times"), PVC's (premature ventricular contractions), Renal failure, Skin cancer ("several"), and Type II diabetes mellitus ([place]).  has a past surgical history that includes Foot fracture surgery (Right, 1980's); Cholecystectomy (2/11); Cataract extraction w/ intraocular lens  implant, bilateral (08/2014-09/2014); Cardiac catheterization (01/11/2006); Coronary angioplasty (1994); Skin cancer excision ("several"); Esophageal manometry (N/A, 03/16/2015); Insert / replace / remove pacemaker; Esophagogastroduodenoscopy (N/A, 04/08/2015); Botox injection (N/A, 04/08/2015); Esophagogastroduodenoscopy (egd) with propofol (N/A, 09/09/2015); Balloon dilation (N/A, 09/09/2015); and Eye surgery. Prior to Admission medications   Medication Sig Start Date End Date Taking? Authorizing Provider  albuterol (PROVENTIL HFA;VENTOLIN HFA) 108 (90 Base) MCG/ACT inhaler Inhale 2 puffs into the lungs every 6 (six) hours as needed for wheezing or shortness of breath. 11/16/17  Yes Juanito Doom, MD  aspirin 81 MG EC tablet Take 81 mg by mouth daily.     Yes [provider]  Cholecalciferol (VITAMIN D) 2000 units tablet Take 2,000 Units by mouth daily.    Yes  [provider]    donepezil (ARICEPT) 10 MG tablet Take 1 tablet (10 mg total) by mouth every morning. 05/01/17  Yes Marcial Pacas, MD  fenofibrate (TRICOR) 145 MG tablet TAKE 1 TABLET BY MOUTH EVERY DAY Patient taking differently: Take 145 mg by mouth once a day 02/10/17  Yes Martinique, Peter M, MD  finasteride (PROSCAR) 5 MG tablet Take 5 mg by mouth daily. 05/19/17  Yes [provider]  furosemide (LASIX) 20 MG tablet Take 1 tablet (20 mg total) by mouth daily. 10/16/17  Yes Regalado, Belkys A, MD  insulin aspart (NOVOLOG) 100 UNIT/ML injection Inject 4-10 Units into the skin 3 (three) times daily before meals.   Yes [provider]  Insulin Glargine (BASAGLAR KWIKPEN) 100 UNIT/ML SOPN INJECT 5 UNITS UNDER THE SKIN EVERY MORNING Patient taking differently: Inject 8 Units into the skin every morning.  10/16/17  Yes Regalado, Belkys A, MD  loratadine (CLARITIN) 10 MG tablet Take 1 tablet (10 mg total) by mouth daily. 07/02/15  Yes Barton Dubois, MD  memantine (NAMENDA) 10 MG tablet Take 1 tablet (10 mg total) by mouth 2 (two) times daily. 05/01/17  Yes Marcial Pacas, MD  Multiple Vitamins-Minerals (PRESERVISION AREDS 2) CAPS Take 1 capsule by mouth 2 (two) times daily.   Yes [provider]  nitroGLYCERIN (NITRODUR - DOSED IN MG/24 HR) 0.2 mg/hr patch Place 1 patch (0.2 mg total) onto the skin daily. Alternates patch placement with odd/even days. Patient taking differently: Place 0.2 mg onto the skin daily.  11/22/17  Yes [place], Peter M, MD  pantoprazole (PROTONIX) 40 MG tablet Take 1 tablet (40 mg total) by mouth 2 (two) times daily. Patient taking differently: Take 40 mg by mouth daily.  10/16/17  Yes Regalado, Belkys A, MD  pravastatin (PRAVACHOL) 40 MG tablet Take 1 tablet (40 mg total) by mouth at bedtime. 05/26/17  Yes [place], Peter M, MD  SPIRIVA HANDIHALER 18 MCG inhalation capsule INHALE CONTENTS OF 1 CAPSULE VIA HANDIHALER ONCE DAILY 12/27/17  Yes Juanito Doom, MD  SYMBICORT 160-4.5  MCG/ACT inhaler INHALE 2 PUFFS BY MOUTH TWICE DAILY Patient taking differently: Inhale 2 puffs into the lungs two times a day 07/31/17  Yes Juanito Doom, MD   Allergies  Allergen Reactions  . Codeine Other (See Comments)    Gets very angry, disoriented  . Dilaudid [Hydromorphone Hcl] Other (See Comments)    VERY AGITATED, HOSTILE  . Flomax [Tamsulosin Hcl] Shortness Of Breath  . Morphine And Related Other (See Comments)    VERY AGITATED, HOSTILE  . Sulfa Antibiotics Shortness Of Breath  . Beta Adrenergic Blockers Other (See Comments)    Disorientation  . Carvedilol Other (See Comments)    DISORIENTATION    FAMILY HISTORY:  family history includes Stroke in his father; Venous thrombosis in his sister. SOCIAL HISTORY:  reports that he quit smoking about 25 years ago. His smoking use included cigarettes. He has a 35.00 pack-year smoking history. He quit smokeless tobacco use about 25 years ago. He reports that he does not drink alcohol or use drugs.  REVIEW OF SYSTEMS:   10 point review of system taken, please see HPI for positives and negatives.   SUBJECTIVE: Able to ambulate without distress  VITAL SIGNS: Temp:  [97.8 F (36.6 C)-99.5 F (37.5 C)] 97.8 F (36.6 C) (06/04 0520) Pulse Rate:  [59-88] 64 (06/04 0846) Resp:  [16-25] 17 (06/04 0846) BP: (88-108)/(42-87) 92/42 (06/04 0522) SpO2:  [88 %-100 %] 99 % (  06/04 0846) FiO2 (%):  [0 %] 0 % (06/03 2223) Weight:  [70.8 kg (156 lb)] 70.8 kg (156 lb) (06/03 1542)  PHYSICAL EXAMINATION: General: Frail elderly male no acute distress able to ambulate without problem Neuro: Follows commands moves all extremities ambulate has poor memory and insight into current medical issues HEENT: No JVD or lymphadenopathy is appreciated Cardiovascular: Heart sounds are distant Lungs: Coarse rhonchi bilaterally Abdomen: Positive bowel sounds Musculoskeletal: Intact Skin: Warm and dry  Recent Labs  Lab 01/01/18 1544 01/02/18 0153   NA 142 143  K 4.3 3.8  CL 105 109  CO2 27 28  BUN 36* 36*  CREATININE 1.99* 1.95*  GLUCOSE 99 159*   Recent Labs  Lab 01/01/18 1553 01/02/18 0153  HGB 12.0* 10.4*  HCT 39.3 34.7*  WBC 22.0* 16.0*  PLT 267 225   Dg Chest 2 View  Result Date: 01/01/2018 CLINICAL DATA:  Hypoxia EXAM: CHEST - 2 VIEW COMPARISON:  12/19/2017 FINDINGS: Cardiac shadow is stable. Defibrillator is again noted. Lungs are hyper aerated bilaterally. No focal infiltrate or sizable effusion is seen. No acute bony abnormality is noted. IMPRESSION: COPD without acute abnormality. Electronically Signed   By: Inez Catalina M.D.   On: 01/01/2018 16:23   Ct Head Wo Contrast  Result Date: 01/01/2018 CLINICAL DATA:  Worsening mental status with decline. EXAM: CT HEAD WITHOUT CONTRAST TECHNIQUE: Contiguous axial images were obtained from the base of the skull through the vertex without intravenous contrast. COMPARISON:  02/15/2017 FINDINGS: Brain: Generalized age related atrophy. No evidence of old or acute focal infarction, mass lesion, hemorrhage, hydrocephalus or extra-axial collection. Vascular: There is atherosclerotic calcification of the major vessels at the base of the brain. Skull: Normal Sinuses/Orbits: Clear/normal Other: None IMPRESSION: Age related atrophy. No focal or acute finding. No progressive change visible since last year. Electronically Signed   By: Nelson Chimes M.D.   On: 01/01/2018 16:41   Ct Chest Wo Contrast  Result Date: 01/01/2018 CLINICAL DATA:  Acute onset of cough. Respiratory illness. EXAM: CT CHEST WITHOUT CONTRAST TECHNIQUE: Multidetector CT imaging of the chest was performed following the standard protocol without IV contrast. COMPARISON:  Chest radiograph performed earlier today at 4:26 p.m., and CT of the chest performed 10/04/2017 FINDINGS: Cardiovascular: The heart is normal in size. Diffuse coronary artery calcifications are seen. Scattered calcification is noted along the thoracic aorta and  proximal great vessels. The patient's pacemaker/AICD is noted at the left chest wall, with leads ending at the right atrium and right ventricle. Mediastinum/Nodes: The esophagus is diffusely distended with solid material, measuring up to 6 cm in diameter proximally, concerning for achalasia. A 1.2 cm azygoesophageal recess node is noted. Remaining visualized mediastinal nodes remain normal in size. No pericardial effusion is identified. The thyroid gland is grossly unremarkable. No axillary lymphadenopathy is seen. Lungs/Pleura: New patchy right-sided airspace opacification is compatible with multifocal pneumonia. Atypical infection is a concern. A number of the opacities are peribronchovascular in nature. Mild peripheral scarring is noted bilaterally. No pleural effusion or pneumothorax is identified. Upper Abdomen: The visualized portions of the liver and spleen are unremarkable. The patient is status post cholecystectomy, with clips noted at the gallbladder fossa. The pancreas is somewhat atrophic in appearance. The visualized portions of the adrenal glands are unremarkable. Musculoskeletal: No acute osseous abnormalities are identified. The visualized musculature is unremarkable in appearance. IMPRESSION: 1. New patchy right-sided airspace opacification is compatible with multifocal pneumonia. Atypical infection is a concern. A number of the opacities  are peribronchovascular in nature. 2. Diffuse distention of the esophagus with solid material, measuring up to 6 cm in diameter proximally, concerning for achalasia. 3. 1.2 cm azygoesophageal recess node noted. This may reflect the underlying infection. 4. Diffuse coronary artery calcifications seen. Aortic Atherosclerosis (ICD10-I70.0). Electronically Signed   By: Garald Balding M.D.   On: 01/01/2018 22:00    ASSESSMENT: Active Problems: Recurrent presumed aspiration pneumonia    Coronary artery disease   Acute on chronic combined systolic and diastolic  congestive heart failure, NYHA class 3 ([name])   S/P ICD (internal cardiac defibrillator) procedure   COPD (chronic obstructive pulmonary disease) ([name])   BPH (benign prostatic hyperplasia)   Acute encephalopathy   COPD GOLD IV   Dysphagia   Esophageal dysmotilities   Achalasia   Acute respiratory failure with hypoxia ([name])   CAD S/P percutaneous coronary angioplasty   Chronic kidney disease, stage III (moderate) ([name])   Type 2 diabetes mellitus with stage 3 chronic kidney disease, with long-term current use of insulin ([name])   Chronic systolic congestive heart failure ([name])   Chronic systolic CHF (congestive heart failure) ([name])   Chronic combined systolic and diastolic congestive heart failure ([name])   Esophageal dysmotility   SIRS (systemic inflammatory response syndrome) ([name])   Aspiration pneumonia of both lower lobes due to gastric secretions [facility])    Discussion: Mr. Cappelletti is an 84 year old male who quit smoking 25 years ago he is been admitted to [facility] 6 times in last 12 months for essentially the same complaints.  He comes in with altered mental status one time felt to be a stroke but was not.  He has know history of achalasia and is status post Botox and dilatation per GI services.  Most likely he is chronically aspirating developed symptoms of fevers and chills and altered mental status and was admitted to the hospital and treated with antibiotics.  He again presents to [facility] 01/01/2018 after having been discharged on 12/22/2017 and presents with symptoms of altered mental status.  Again he has history of recurrent aspiration pneumonitis, chronic systolic heart failure is considered to have significant COPD, chronic kidney disease, he also has an AICD for sick sinus rhythm is been implanted.  Past medical history also includes diabetes mellitus.  He was admitted and started on azithromycin Zosyn and vancomycin for presumed hospital-acquired pneumonia.  His chest x-ray 2  views demonstrate COPD without acute abnormality.  He is followed by Dr. Lake Bells of the pulmonary critical care division [place].  Dr. Anastasia Pall notes expressed concern concerning his esophageal dysmotility.  He has been dilated and had Botox injections in the past.  Swallow eval shows retention of contrast.  Question whether or not he needs PEG to prevent this recurrent aspiration.  Please note he remains to be a full code at this time.  He is a poor historian secondary to his mild dementia and he is essentially turned over his care to his son.    PLAN:  Low threshold for discontinuing antibiotics.  Procalcitonin 0.54  Bronchodilators  Panculture  Culture for acid-fast bacteria  Consider end-of-life discussions and CODE STATUS evaluation  Consider possibility of placing PEG   [facility] ACNP Maryanna Shape PCCM Pager [phone] till 1 pm If no answer page [phone] 01/02/2018, 10:25 AM  Attending Note:  84 year old male with severe esophageal achalasia presenting with chronic cough, chronic dysphagia and aspiration pneumonia.  On exam, bibasilar crackles R>L.  I reviewed chest CT myself, food  material in the esophagus and RLL infiltrate noted.  Discussed with PCCM-NP.  Dysphagia: due to achalasia  - GI consult  - NPO  - Speech  Achalasia:   - GI to dilate  Aspiration: procal not elevated  - Recommend abx for 7 days then d/c given procedures to come  COPD history:  - BD as ordered  - No sign of acute exacerbation to require steroids  GOC: given extensive dysphagia and chronic infection, may need to consider involvement of palliative care  Hypoxemia:  - Titrate O2 for sat of 88-92%  - May need an ambulatory desat study prior to discharge for home O2 requirement  PCCM will sign off, please call back if needed.  Patient seen and examined, agree with above note.  I dictated the care and orders written for this patient under my direction.  Rush Farmer, [place]

## 2018-01-02 NOTE — Evaluation (Signed)
Physical Therapy Evaluation [name] MRN: [id number] DOB: February 18, 1934 Today's Date: 01/02/2018   History of Present Illness  84 y.o. male with medical history significant of achalasia, recurrent aspiration pneumonitis, chronic systolic heart failure, COPD chronic kidney disease stage III, DM2, recurrent admissions for acute encephalopathy and SIRS  Clinical Impression  Patient seen for mobility assessment. Mobilizing well. Performed activity on room air with saturations to 91% with increased activity. Overall reports he is at his mobility baseline. No further acute PT needs. Will sign off.     Follow Up Recommendations No PT follow up    Equipment Recommendations  None recommended by PT    Recommendations for Other Services       Precautions / Restrictions Precautions Precautions: Fall      Mobility  Bed Mobility Overal bed mobility: Independent             General bed mobility comments: no physical assist or cues required  Transfers Overall transfer level: Independent Equipment used: None             General transfer comment: able to stand from bed as well as chair without arm rests  Ambulation/Gait Ambulation/Gait assistance: Modified independent (Device/Increase time) Ambulation Distance (Feet): 280 Feet Assistive device: None Gait Pattern/deviations: Step-through pattern;Drifts right/left     General Gait Details: modest instability but no overt LOB noted  Stairs Stairs: Yes Stairs assistance: Supervision Stair Management: One rail Right Number of Stairs: 4 General stair comments: no difficulty with stair negotitation, modest DOE 2/4 upon completion  Wheelchair Mobility    Modified Rankin (Stroke Patients Only)       Balance Overall balance assessment: Needs assistance Sitting-balance support: No upper extremity supported;Feet supported Sitting balance-Leahy Scale: Normal     Standing balance support: No upper  extremity supported;During functional activity Standing balance-Leahy Scale: Good               High level balance activites: Side stepping;Direction changes;Turns High Level Balance Comments: steady with activity             Pertinent Vitals/Pain Pain Assessment: No/denies pain    Home Living Family/patient expects to be discharged to:: Private residence Living Arrangements: Alone Available Help at Discharge: Family;Available PRN/intermittently Type of Home: House Home Access: Stairs to enter Entrance Stairs-Rails: Right;Left(uses right railing) Entrance Stairs-Number of Steps: 3 Home Layout: Two level;Able to live on main level with bedroom/bathroom;Full bath on main level Home Equipment: Cane - single point;Walker - 2 wheels;Walker - 4 wheels;Shower seat;Grab bars - tub/shower;Hand held shower head Additional Comments: he walks daily for exercise    Prior Function Level of Independence: Independent         Comments: uses walking stick when walking in the woods     Hand Dominance   Dominant Hand: Right    Extremity/Trunk Assessment   Upper Extremity Assessment Upper Extremity Assessment: Overall WFL for tasks assessed    Lower Extremity Assessment Lower Extremity Assessment: Overall WFL for tasks assessed    Cervical / Trunk Assessment Cervical / Trunk Assessment: Kyphotic  Communication   Communication: HOH  Cognition Arousal/Alertness: Awake/alert Behavior During Therapy: WFL for tasks assessed/performed Overall Cognitive Status: History of cognitive impairments - at baseline                                 General Comments: history of dementia. Alert and oriented x3 however did have mild  confusion.      General Comments      Exercises     Assessment/Plan    PT Assessment Patent does not need any further PT services  PT Problem List         PT Treatment Interventions      PT Goals (Current goals can be found in the  Care Plan section)  Acute Rehab PT Goals PT Goal Formulation: All assessment and education complete, DC therapy    Frequency     Barriers to discharge        Co-evaluation               AM-PAC PT "6 Clicks" Daily Activity  Outcome Measure Difficulty turning over in bed (including adjusting bedclothes, sheets and blankets)?: None Difficulty moving from lying on back to sitting on the side of the bed? : None Difficulty sitting down on and standing up from a chair with arms (e.g., wheelchair, bedside commode, etc,.)?: None Help needed moving to and from a bed to chair (including a wheelchair)?: None Help needed walking in hospital room?: None Help needed climbing 3-5 steps with a railing? : A Little 6 Click Score: 23    End of Session Equipment Utilized During Treatment: Gait belt Activity Tolerance: Patient tolerated treatment well Patient left: in chair;with call bell/phone within reach;with chair alarm set Nurse Communication: Mobility status PT Visit Diagnosis: Unsteadiness on feet (R26.81);Muscle weakness (generalized) (M62.81)    Time: 1610-9604 PT Time Calculation (min) (ACUTE ONLY): 17 min   Charges:   PT Evaluation $PT Eval Moderate Complexity: 1 Mod     PT G Codes:        Alben Deeds, PT DPT  Board Certified Neurologic Specialist [place] 01/02/2018, 9:19 AM

## 2018-01-02 NOTE — Progress Notes (Signed)
Hypoglycemic Event  CBG: 68  Treatment: Given 1/2 amp D50 IV (Patient NPO)  Symptoms: None.  Follow-up CBG: Time: 1333 CBG Result: 124  Possible Reasons for Event: Patient NPO.  Comments/MD notified: N/A    Melonie [place]

## 2018-01-02 NOTE — Consult Note (Addendum)
[facility] Consult: 10:26 AM 01/02/2018  LOS: 0 days    Referring Provider: Dr Broadus John  Primary Care Physician:  [name], MD Primary Gastroenterologist:  Dr. Silverio Decamp.      Reason for Consultation:  Dysphagia, aspiration PNA.     HPI: Antonio Ray is a 84 y.o. male.  Hx DM2.  CHF, EF 30 to 35%.  S/p ICD.  Stage 3 CKD.  CAD, PCI  1994, 2006.  Skin cancer.  Mild dementia/cognitive dysfunction.  Cholecystectomy 2011.  Appendectomy 09/2017 Long standing achalasia, dysphagia.  Previous Botox injections, last done 04/2015.  Pneumatic dilatation of esophagus 09/2015, his latest EGD.  Many years hx of recurrent aspiration PNA,   Barium esophagram 10/2016 barium tablet did not pass but liquid barium passed.  As of 12/06/16 pt, his son and Dr Silverio Decamp discussed repeat pneumatic dilatation but family wished to hold off.   Admission in 09/2017.  CT chest 10/04/2017.  Showed dilated, fluid-filled esophagus and suggestion of infectious bronchiolitis in the right lung.   Pulmonary, Dr. Lake Bells, felt that the patient had tracheal bronchitis versus aspiration pneumonia.  Seen in consultation by Dr. Henrene Pastor regarding role of achalasia in recurrent episodes of (likely aspiration) pneumonia.  At the time pt described progressive symptoms of dysphagia but he was eating regular diet, trying to eat slowly and chew his food well.  There was discussion with patient about repeating endoscopy and pneumatic dilatation versus repeating Botox injection.  No repeat endoscopy was performed during that admission. He followed up with Dr. Silverio Decamp in late 09/2017 and pt said that he was swallowing well and not regurgitating food or liquids as he had done previously.  Dr. Woodward Ku plan was to repeat barium esophagram.  She noted that "Repeat botox  injection or pneumatic dilation of EG junction will have minimum benefit if has no evidence of EGJ outflow obstruction on Barium esophagogram and he is at risk for potential anesthesia and procedure related complications.  He is not a candidate for any surgical intervention or esophagectomy. Discussed dietary and lifestyle adjustments in detail to minimize aspiration".  11/06/2017 barium esophagram, combined double and single contrast study.  This showed high-grade stricture at GE junction.  Esophageal dilatation throughout with stasis of contrast and tertiary contractions.  High-grade stricture at GE junction.  Contrast did not pass the strictured area, even with gravity assistance.  Barium tablet challenge not attempted.  Tiny hiatal hernia.  No GI follow up since esophagram concluded.   Pt eats slowly, chews his solid food well and so long as he takes his time he has very little dysphagia.  After each he does not experience bite and swallow food, he gives time for the food to pass through the esophagus before he eats or drinks anything else. Maybe once a week he says he regurgitates.  He denies a sense of food getting stuck.  He denies coughing/sputtering with p.o. intake.  He does not drink liquid and eat solid food at the same time because if he does that will trigger regurgitation.  Patient admitted  5/21-5/24/2019 with sepsis pathophysiology but no source found.  Chest x-ray showed stable bilateral, right >> left lung opacities. He was admitted yesterday after being brought to the ED with confusion.  Room air saturations were 88%.  Patient denies subjective shortness of breath or cough but he was noted to be wheezing. CT had with no acute changes, just age-related atrophy. CT chest showing new right sided, multi focal pneumonia the esophagus is diffusely distended containing solid material.  Azygoesophageal recess, possibly reflecting underlying infection.  Diffuse coronary calcifications. WBCs 22 >>  16 after abx.   Hgb 12 >> 10.4. Stable renal function.      Past Medical History:  Diagnosis Date  . Achalasia   . AICD (automatic cardioverter/defibrillator) present 03/30/2011   Analyze ST study patient  . Anginal pain ([place])   . BPH (benign prostatic hyperplasia)   . CHF (congestive heart failure) ([place])   . Chronic systolic dysfunction of left ventricle    EF 30-35%, CLASS II - III SYMPTOMS; intolerant to Coreg  . CKD (chronic kidney disease) stage 3, GFR 30-59 ml/min ([name]) 06/08/2012  . COPD (chronic obstructive pulmonary disease) ([place])   . Coronary artery disease    History of remote anterior MI with PCI to LAD in 2006; most recent cath 2007, no intervention required  . DOE (dyspnea on exertion)    with heavy exertion  . Dysrhythmia   . Esophageal dysmotility   . GERD (gastroesophageal reflux disease)   . GERD (gastroesophageal reflux disease)   . Head injury, closed, with concussion 2000ish  . Heart murmur    hx  . HOH (hard of hearing)   . Hyperlipidemia   . Memory loss    improved  . Myocardial infarction ([place]) 1994   ANTERIOR  . Pneumonia "several times"   aspiration pna with at least 3 admits for this 2016.   Marland Kitchen PVC's (premature ventricular contractions)   . Renal failure   . Skin cancer "several"   "forearms; head"  . Type II diabetes mellitus ([place])     Past Surgical History:  Procedure Laterality Date  . BALLOON DILATION N/A 09/09/2015   Procedure: BALLOON DILATION;  Surgeon: Mauri Pole, MD;  Location: [facility];  Service: Endoscopy;  Laterality: N/A;  rigiflex achalasia balloon dilators  . BOTOX INJECTION N/A 04/08/2015   Procedure: BOTOX INJECTION;  Surgeon: Milus Banister, MD;  Location: [facility];  Service: Endoscopy;  Laterality: N/A;  . CARDIAC CATHETERIZATION  01/11/2006   DEMONSTRATES AKINESIA OF THE DISTAL ANTERIOR WALL, DISTAL INFERIOR WALL AND AKINESIA OF THE APEX. THE BASAL SEGMENTS CONTRACT WELL AND OVERALL EF 35%  . CATARACT EXTRACTION  W/ INTRAOCULAR LENS  IMPLANT, BILATERAL  08/2014-09/2014  . CHOLECYSTECTOMY  2/11  . CORONARY ANGIOPLASTY  1994   TO THE LAD  . ESOPHAGEAL MANOMETRY N/A 03/16/2015   Procedure: ESOPHAGEAL MANOMETRY (EM);  Surgeon: Jerene Bears, MD;  Location: [facility];  Service: Gastroenterology;  Laterality: N/A;  . ESOPHAGOGASTRODUODENOSCOPY N/A 04/08/2015   Procedure: ESOPHAGOGASTRODUODENOSCOPY (EGD);  Surgeon: Milus Banister, MD;  Location: [facility];  Service: Endoscopy;  Laterality: N/A;  . ESOPHAGOGASTRODUODENOSCOPY (EGD) WITH PROPOFOL N/A 09/09/2015   Procedure: ESOPHAGOGASTRODUODENOSCOPY (EGD) WITH PROPOFOL;  Surgeon: Mauri Pole, MD;  Location: [facility];  Service: Endoscopy;  Laterality: N/A;  pt. is to have gastrografin xray post recovery-do not discharge until results are back  . EYE SURGERY    . FOOT FRACTURE SURGERY Right 1980's  . INSERT / REPLACE / REMOVE  PACEMAKER    . SKIN CANCER EXCISION  "several"   "forearms, head"    Prior to Admission medications   Medication Sig Start Date End Date Taking? Authorizing Provider  albuterol (PROVENTIL HFA;VENTOLIN HFA) 108 (90 Base) MCG/ACT inhaler Inhale 2 puffs into the lungs every 6 (six) hours as needed for wheezing or shortness of breath. 11/16/17  Yes Juanito Doom, MD  aspirin 81 MG EC tablet Take 81 mg by mouth daily.     Yes [provider]  Cholecalciferol (VITAMIN D) 2000 units tablet Take 2,000 Units by mouth daily.    Yes [provider]  donepezil (ARICEPT) 10 MG tablet Take 1 tablet (10 mg total) by mouth every morning. 05/01/17  Yes Marcial Pacas, MD  fenofibrate (TRICOR) 145 MG tablet TAKE 1 TABLET BY MOUTH EVERY DAY Patient taking differently: Take 145 mg by mouth once a day 02/10/17  Yes Martinique, Peter M, MD  finasteride (PROSCAR) 5 MG tablet Take 5 mg by mouth daily. 05/19/17  Yes [provider]  furosemide (LASIX) 20 MG tablet Take 1 tablet (20 mg total) by mouth daily. 10/16/17  Yes Regalado,  Belkys A, MD  insulin aspart (NOVOLOG) 100 UNIT/ML injection Inject 4-10 Units into the skin 3 (three) times daily before meals.   Yes [provider]  Insulin Glargine (BASAGLAR KWIKPEN) 100 UNIT/ML SOPN INJECT 5 UNITS UNDER THE SKIN EVERY MORNING Patient taking differently: Inject 8 Units into the skin every morning.  10/16/17  Yes Regalado, Belkys A, MD  loratadine (CLARITIN) 10 MG tablet Take 1 tablet (10 mg total) by mouth daily. 07/02/15  Yes Barton Dubois, MD  memantine (NAMENDA) 10 MG tablet Take 1 tablet (10 mg total) by mouth 2 (two) times daily. 05/01/17  Yes Marcial Pacas, MD  Multiple Vitamins-Minerals (PRESERVISION AREDS 2) CAPS Take 1 capsule by mouth 2 (two) times daily.   Yes [provider]  nitroGLYCERIN (NITRODUR - DOSED IN MG/24 HR) 0.2 mg/hr patch Place 1 patch (0.2 mg total) onto the skin daily. Alternates patch placement with odd/even days. Patient taking differently: Place 0.2 mg onto the skin daily.  11/22/17  Yes Martinique, Peter M, MD  pantoprazole (PROTONIX) 40 MG tablet Take 1 tablet (40 mg total) by mouth 2 (two) times daily. Patient taking differently: Take 40 mg by mouth daily.  10/16/17  Yes Regalado, Belkys A, MD  pravastatin (PRAVACHOL) 40 MG tablet Take 1 tablet (40 mg total) by mouth at bedtime. 05/26/17  Yes Martinique, Peter M, MD  SPIRIVA HANDIHALER 18 MCG inhalation capsule INHALE CONTENTS OF 1 CAPSULE VIA HANDIHALER ONCE DAILY 12/27/17  Yes Juanito Doom, MD  SYMBICORT 160-4.5 MCG/ACT inhaler INHALE 2 PUFFS BY MOUTH TWICE DAILY Patient taking differently: Inhale 2 puffs into the lungs two times a day 07/31/17  Yes Juanito Doom, MD    Scheduled Meds: . aspirin  81 mg Oral Daily  . donepezil  10 mg Oral q morning - 10a  . enoxaparin (LOVENOX) injection  30 mg Subcutaneous Q24H  . fenofibrate  160 mg Oral Daily  . guaiFENesin  600 mg Oral BID  . insulin aspart  0-5 Units Subcutaneous QHS  . insulin aspart  0-9 Units Subcutaneous TID WC  .  insulin glargine  5 Units Subcutaneous [id number]  . loratadine  10 mg Oral Daily  . memantine  10 mg Oral BID  . mometasone-formoterol  2 puff Inhalation BID  . pantoprazole  40 mg Oral Daily  . pravastatin  40 mg Oral QHS   Infusions: . azithromycin Stopped (01/02/18 0015)  . piperacillin-tazobactam (ZOSYN)  IV 3.375 g (01/02/18 0920)  . vancomycin     PRN Meds: acetaminophen **OR** acetaminophen, albuterol, ondansetron **OR** ondansetron (ZOFRAN) IV   Allergies as of 01/01/2018 - Review Complete 01/01/2018  Allergen Reaction Noted  . Codeine Other (See Comments)   . Dilaudid [hydromorphone hcl] Other (See Comments) 09/10/2013  . Flomax [tamsulosin hcl] Shortness Of Breath 05/19/2012  . Morphine and related Other (See Comments) 05/20/2011  . Sulfa antibiotics Shortness Of Breath 05/23/2012  . Beta adrenergic blockers Other (See Comments) 03/31/2015  . Carvedilol Other (See Comments) 04/18/2011    Family History  Problem Relation Age of Onset  . Stroke Father        Mother history unknown - never met her  . Venous thrombosis Sister   . Colon cancer Neg Hx     Social History   Socioeconomic History  . Marital status: Widowed    Spouse name: Not on file  . Number of children: 2  . Years of education: GED  . Highest education level: Not on file  Occupational History  . Occupation: Clinical biochemist: [employer]    Comment: Retired  . Occupation: [employer]    Employer: [employer]    Comment: Retired  Scientific laboratory technician  . Financial resource strain: Patient refused  . Food insecurity:    Worry: Never true    Inability: Never true  . Transportation needs:    Medical: No    Non-medical: No  Tobacco Use  . Smoking status: Former Smoker    Packs/day: 1.00    Years: 35.00    Pack years: 35.00    Types: Cigarettes    Last attempt to quit: 08/01/1992    Years since quitting: 25.4  . Smokeless tobacco: Former Systems developer    Quit date: 1994  Substance and Sexual  Activity  . Alcohol use: No    Alcohol/week: 0.0 oz  . Drug use: No  . Sexual activity: Not Currently  Lifestyle  . Physical activity:    Days per week: Patient refused    Minutes per session: Patient refused  . Stress: Patient refused  Relationships  . Social connections:    Talks on phone: Not on file    Gets together: Not on file    Attends religious service: Not on file    Active member of club or organization: Not on file    Attends meetings of clubs or organizations: Not on file    Relationship status: Not on file  . Intimate partner violence:    Fear of current or ex partner: Not on file    Emotionally abused: Not on file    Physically abused: Not on file    Forced sexual activity: Not on file  Other Topics Concern  . Not on file  Social History Narrative   Pt lives in [place] alone.  Widowed.   Right-handed.   Rare caffeine use - maybe one cup per month.    REVIEW OF SYSTEMS: Constitutional: Patient denies weakness or fatigue.  His activity is limited to the ground floor of his house but he manages just fine at home. ENT:  No nose bleeds Pulm: Occasional cough productive of watery sputum.  No blood or rusty colored material. CV:  No palpitations, no LE edema.  Chest pain GU:  No hematuria, no frequency GI: Brown stools at least once a day.  No abdominal pain.  Blood in the stool. Heme: Bruises easily but no unusual bleeding. Transfusions: Does not recall previous blood transfusions.  He used to donate blood on her regular basis. Neuro:  No headaches, no peripheral tingling or numbness Derm:  No itching, no rash or sores.  Endocrine:  No sweats or chills.  No polyuria or dysuria Immunization: Vaccinations reviewed. Travel:  None beyond local counties in last few months.    PHYSICAL EXAM: Vital signs in last 24 hours: Vitals:   01/02/18 0522 01/02/18 0846  BP: (!) 92/42   Pulse: 60 64  Resp:  17  Temp:    SpO2:  99%   Wt Readings from Last 3  Encounters:  01/01/18 156 lb (70.8 kg)  12/22/17 164 lb 14.5 oz (74.8 kg)  10/23/17 161 lb 2 oz (73.1 kg)    General: Pleasant, aged, frail appearing, alert, comfortable WM. Head: No facial asymmetry or swelling.  No signs of head trauma. Eyes: No conjunctival pallor.  No scleral icterus.  EOMI. Ears: Slightly hard of hearing. Nose: No congestion or discharge. Mouth: Lots of missing teeth, most molars are absent.  His partial dentures are not in place.  Oral mucosa is moist, pink, clear.  Tongue midline. Neck: No JVD, no masses, no thyromegaly. Lungs: Breath sounds are clear.  There is some coarseness, subtle, rales in the right base.  No dyspnea.  Occasional cough. Heart: RRR.  No MRG.  S1, S2 present. Abdomen: Soft.  Not tender or distended.  Active bowel sounds.  No hernias, masses, HSM, bruits..   Rectal: Deferred Musc/Skeltl: No joint redness or swelling.  Slight kyphosis. Extremities: No CCE. Neurologic: Patient is alert.  Oriented x3.  No tremor, no limb weakness. Skin: Purpura on arms. Tattoos: None observed Nodes: No cervical adenopathy. Psych: Calm, cooperative, pleasant.  Clear speech.  Intake/Output from previous day: 06/03 0701 - 06/04 0700 In: 2107.9 [I.V.:507.5; IV Piggyback:1600.4] Out: 300 [Urine:300] Intake/Output this shift: Total I/O In: -  Out: 100 [Urine:100]  LAB RESULTS: Recent Labs    01/01/18 1553 01/02/18 0153  WBC 22.0* 16.0*  HGB 12.0* 10.4*  HCT 39.3 34.7*  PLT 267 225   BMET Lab Results  Component Value Date   NA 143 01/02/2018   NA 142 01/01/2018   NA 144 12/21/2017   K 3.8 01/02/2018   K 4.3 01/01/2018   K 3.9 12/21/2017   CL 109 01/02/2018   CL 105 01/01/2018   CL 110 12/21/2017   CO2 28 01/02/2018   CO2 27 01/01/2018   CO2 29 12/21/2017   GLUCOSE 159 (H) 01/02/2018   GLUCOSE 99 01/01/2018   GLUCOSE 110 (H) 12/21/2017   BUN 36 (H) 01/02/2018   BUN 36 (H) 01/01/2018   BUN 26 (H) 12/21/2017   CREATININE 1.95 (H)  01/02/2018   CREATININE 1.99 (H) 01/01/2018   CREATININE 1.74 (H) 12/21/2017   CALCIUM 8.4 (L) 01/02/2018   CALCIUM 9.2 01/01/2018   CALCIUM 8.6 (L) 12/21/2017   LFT Recent Labs    01/01/18 1544 01/02/18 0153  PROT 6.7 5.4*  ALBUMIN 3.0* 2.4*  AST 27 21  ALT 15* 15*  ALKPHOS 38 33*  BILITOT 0.7 0.7   PT/INR Lab Results  Component Value Date   INR 1.23 01/01/2018   INR 1.24 10/14/2017   INR 1.24 06/12/2017   Hepatitis Panel No results for input(s): HEPBSAG, HCVAB, HEPAIGM, HEPBIGM in the last 72 hours. C-Diff No components found for: CDIFF Lipase  Component Value Date/Time   LIPASE 14 09/08/2009 0445    Drugs of Abuse  No results found for: LABOPIA, COCAINSCRNUR, LABBENZ, AMPHETMU, THCU, LABBARB   RADIOLOGY STUDIES: Dg Chest 2 View  Result Date: 01/01/2018 CLINICAL DATA:  Hypoxia EXAM: CHEST - 2 VIEW COMPARISON:  12/19/2017 FINDINGS: Cardiac shadow is stable. Defibrillator is again noted. Lungs are hyper aerated bilaterally. No focal infiltrate or sizable effusion is seen. No acute bony abnormality is noted. IMPRESSION: COPD without acute abnormality. Electronically Signed   By: Inez Catalina M.D.   On: 01/01/2018 16:23   Ct Head Wo Contrast  Result Date: 01/01/2018 CLINICAL DATA:  Worsening mental status with decline. EXAM: CT HEAD WITHOUT CONTRAST TECHNIQUE: Contiguous axial images were obtained from the base of the skull through the vertex without intravenous contrast. COMPARISON:  02/15/2017 FINDINGS: Brain: Generalized age related atrophy. No evidence of old or acute focal infarction, mass lesion, hemorrhage, hydrocephalus or extra-axial collection. Vascular: There is atherosclerotic calcification of the major vessels at the base of the brain. Skull: Normal Sinuses/Orbits: Clear/normal Other: None IMPRESSION: Age related atrophy. No focal or acute finding. No progressive change visible since last year. Electronically Signed   By: Nelson Chimes M.D.   On: 01/01/2018  16:41   Ct Chest Wo Contrast  Result Date: 01/01/2018 CLINICAL DATA:  Acute onset of cough. Respiratory illness. EXAM: CT CHEST WITHOUT CONTRAST TECHNIQUE: Multidetector CT imaging of the chest was performed following the standard protocol without IV contrast. COMPARISON:  Chest radiograph performed earlier today at 4:26 p.m., and CT of the chest performed 10/04/2017 FINDINGS: Cardiovascular: The heart is normal in size. Diffuse coronary artery calcifications are seen. Scattered calcification is noted along the thoracic aorta and proximal great vessels. The patient's pacemaker/AICD is noted at the left chest wall, with leads ending at the right atrium and right ventricle. Mediastinum/Nodes: The esophagus is diffusely distended with solid material, measuring up to 6 cm in diameter proximally, concerning for achalasia. A 1.2 cm azygoesophageal recess node is noted. Remaining visualized mediastinal nodes remain normal in size. No pericardial effusion is identified. The thyroid gland is grossly unremarkable. No axillary lymphadenopathy is seen. Lungs/Pleura: New patchy right-sided airspace opacification is compatible with multifocal pneumonia. Atypical infection is a concern. A number of the opacities are peribronchovascular in nature. Mild peripheral scarring is noted bilaterally. No pleural effusion or pneumothorax is identified. Upper Abdomen: The visualized portions of the liver and spleen are unremarkable. The patient is status post cholecystectomy, with clips noted at the gallbladder fossa. The pancreas is somewhat atrophic in appearance. The visualized portions of the adrenal glands are unremarkable. Musculoskeletal: No acute osseous abnormalities are identified. The visualized musculature is unremarkable in appearance. IMPRESSION: 1. New patchy right-sided airspace opacification is compatible with multifocal pneumonia. Atypical infection is a concern. A number of the opacities are peribronchovascular in  nature. 2. Diffuse distention of the esophagus with solid material, measuring up to 6 cm in diameter proximally, concerning for achalasia. 3. 1.2 cm azygoesophageal recess node noted. This may reflect the underlying infection. 4. Diffuse coronary artery calcifications seen. Aortic Atherosclerosis (ICD10-I70.0). Electronically Signed   By: Garald Balding M.D.   On: 01/01/2018 22:00     IMPRESSION:   *    Achalasia.  Objective findings suggest severe disease with food stacking in dilated esophagus per esophagram in April and CT chest yesterday.  *   Aspiration pneumonia, recurrent.  Confusion, hypoxia, leukocytosis improved with ongoing Zithromaxand vancomycin; single dose Zosyn (now discontinued  *  Normocytic anemia.    *    CHF, compensated.  ICD in place.  *    CKD stage 3 stable   PLAN:     *   EGD with pneumatic dilatation?  Dr Loletha Carrow will be by to see pt later.  Patient will want to have discussion regarding any planned procedures with his children.  In meantime NPO.  No IVF running so added D5 1/2NS at 75/hour.      Azucena Freed  01/02/2018, 10:26 AM Phone [phone]  I have reviewed the entire case in detail with the above APP and discussed the plan in detail.  Therefore, I agree with the diagnoses recorded above. In addition,  I have personally interviewed and examined the patient and have personally reviewed any abdominal/pelvic CT scan images.  My additional thoughts are as follows:  This is a complicated scenario, and I discussed the case at length with Dr. Silverio Decamp  and later in the evening with the patient's son Antonio Ray [phone] mobile).  The patient has advanced long-standing achalasia with severe esophageal dilatation and a large amount of retained food.  I agree with other clinicians concerns that he is probably having recurrent episodes of aspiration as a result.  His son informs me that the CT scan done upon admission was only about 20 minutes after the patient  had eaten a meal, perhaps accounting for some of the retained food seen in there.  Nevertheless, I think he is probably having solid food retained in the esophagus on a regular basis.   There have been discussions over the last couple of months about whether or not to proceed with a upper endoscopy and pneumatic dilation or other therapy for the achalasia.  The patient and his family have decided not to do so since he was apparently reasonably stable.  At this juncture, however, it seems that the risk benefit ratio favors making an attempt at a therapeutic intervention.  The patient is at high risk for anesthesia given his poor ejection fraction, advanced age and medical comorbidities.  It is uncertain how much benefit he will get from either pneumatic dilation or Botox injection of the LES.  On the other hand, recurrent aspiration also poses a great threat to him, and perhaps an endoscopic intervention may improve his swallowing and decrease that risk.  The patient would almost certainly require general anesthesia for airway protection because we believe there will be retained food in the dilated and aperistaltic esophagus.  We will probably have to remove food material from the esophagus, and then make an intervention on the LES. Dr. Silverio Decamp is the only physician in our practice that performs large diameter pneumatic dilation for achalasia.  We both feel that the problem requires attention prior to this hospital discharge.  In our discussion today, she felt that balloon dilation and Botox were most likely of equivalent efficacy at this late stage of the patient's condition.  I offered  an upper endoscopy prior to discharge with probable removal of retained esophageal food contents and a Botox injection of the LES. Antonio Ray said he would discuss with his father but he believes they will proceed.  The timing remains to be determined, depending upon the availability of the endoscopy department and anesthesia  support.  The patient is n.p.o. and on IV fluid support until the procedure is done, and tomorrow morning I will know whether or not the schedule allows an upper endoscopy tomorrow or the following day.  Risks of  the procedure were reviewed in detail with Antonio Ray and will be discussed with the patient tomorrow. Patient at increased risk for cardiopulmonary complications of procedure due to medical comorbidities.   The benefits and risks of the planned procedure were described in detail with the patient or (when appropriate) their health care proxy.  Risks were outlined as including, but not limited to, bleeding, infection, perforation, adverse medication reaction leading to cardiac or pulmonary decompensation, or pancreatitis (if ERCP).  The limitation of incomplete mucosal visualization was also discussed.  No guarantees or warranties were given.   Nelida Meuse III Office:[phone]

## 2018-01-02 NOTE — Telephone Encounter (Signed)
Called and left detailed  message with Patient's son [name].

## 2018-01-02 NOTE — Progress Notes (Signed)
Occupational Therapy Evaluation [name] MRN: [id number] DOB: 1934-07-20 Today's Date: 01/02/2018    History of Present Illness 84 y.o. male with medical history significant of achalasia, recurrent aspiration pneumonitis, chronic systolic heart failure, COPD chronic kidney disease stage III, DM2, recurrent admissions for acute encephalopathy and SIRS   Clinical Impression   PTA, pt lived alone and his children "checked on him several times/day". Pt assessed with the [facility] Cognitive Assessment [facility]), receiving a score of 15/30 (26 and above is considered abnormal), demonstrating deficits as listed below. Discussed pt's performance with his son over the phone and do not feel that pt is at his cognitive baseline. Given cognitive deficits, discussed need for Supervision with all medications at this time, in addition to recommendation that pt refrain from driving at this time. Pt demonstrates 2/4 DOE duringADL with O2 @ 92 on 2L. At this time, recommend follow up with [facility] services given cognitive deficits and decline in endurance/activity tolerance. Feel HHOT can also assess home for safety and make recommendations to family/pt to compensate for apparent cognitive deficits and reduce risk of readmission. If pt continues to live independetly, recommend family get pt an ID bracelet. Will follow acutely to facilitate safe DC home.    Follow Up Recommendations  Supervision/Assistance - 24 hour;Home health OT(initially)    Equipment Recommendations  None recommended by OT    Recommendations for Other Services       Precautions / Restrictions Precautions Precautions: Fall Restrictions Weight Bearing Restrictions: No      Mobility Bed Mobility               General bed mobility comments: OOB in chair  Transfers Overall transfer level: Modified independent Equipment used: None                  Balance Overall balance assessment: Needs  assistance Sitting-balance support: No upper extremity supported;Feet supported Sitting balance-Leahy Scale: Normal     Standing balance support: No upper extremity supported;During functional activity Standing balance-Leahy Scale: Fair Standing balance comment: Reaching to hold on during LB simulated dressing/bathing tasks               High Level Balance Comments: steady with activity           ADL either performed or assessed with clinical judgement   ADL Overall ADL's : Needs assistance/impaired     Grooming: Set up;Supervision/safety;Standing   Upper Body Bathing: Supervision/ safety;Set up;Sitting   Lower Body Bathing: Supervison/ safety;Set up;Sit to/from stand   Upper Body Dressing : Set up;Sitting   Lower Body Dressing: Supervision/safety;Set up;Sit to/from stand   Toilet Transfer: Ambulation;Min guard   Toileting- Water quality scientist and Hygiene: Supervision/safety       Functional mobility during ADLs: Modified independent General ADL Comments: "I just feel unsteady". DOE 2/4 with basic ADL tasks     Vision Baseline Vision/History: Wears glasses       Perception     Praxis      Pertinent Vitals/Pain Pain Assessment: No/denies pain     Hand Dominance Right   Extremity/Trunk Assessment Upper Extremity Assessment Upper Extremity Assessment: Overall WFL for tasks assessed   Lower Extremity Assessment Lower Extremity Assessment: Defer to PT evaluation   Cervical / Trunk Assessment Cervical / Trunk Assessment: Kyphotic   Communication Communication Communication: HOH   Cognition Arousal/Alertness: Awake/alert Behavior During Therapy: WFL for tasks assessed/performed Overall Cognitive Status: Impaired/Different from baseline Area of Impairment: Orientation;Attention;Memory;Safety/judgement;Awareness;Problem solving  Orientation Level: Disoriented to;Time(1997) Current Attention Level: Selective Memory:  Decreased short-term memory   Safety/Judgement: Decreased awareness of safety Awareness: Emergent Problem Solving: Slow processing;Difficulty sequencing;Requires verbal cues General Comments: Assessed using the [place], scoring 15/30 demonstrating deficits with visuospatial and executive level skills, attention, verbal fluency, serial subtraction and immediate and delayed recall.    General Comments       Exercises     Shoulder Instructions      Home Living Family/patient expects to be discharged to:: Private residence Living Arrangements: Alone Available Help at Discharge: Family;Available PRN/intermittently Type of Home: House Home Access: Stairs to enter [employer] of Steps: 3 Entrance Stairs-Rails: Right;Left(uses right railing) Home Layout: Two level;Able to live on main level with bedroom/bathroom;Full bath on main level Alternate Level Stairs-Number of Steps: flight Alternate Level Stairs-Rails: Right;Left Bathroom Shower/Tub: Occupational psychologist: Handicapped height Bathroom Accessibility: Yes   Home Equipment: Cane - single point;Walker - 2 wheels;Walker - 4 wheels;Shower seat;Grab bars - tub/shower;Hand held shower head   Additional Comments: he walks daily for exercise      Prior Functioning/Environment Level of Independence: Independent        Comments: uses walking stick when walking in the woods; drives; manages his CBG/insulin        OT Problem List: Decreased activity tolerance;Decreased cognition;Decreased safety awareness      OT Treatment/Interventions: Self-care/ADL training;Energy conservation;DME and/or AE instruction;Therapeutic activities;Patient/family education;Cognitive remediation/compensation    OT Goals(Current goals can be found in the care plan section) Acute Rehab OT Goals Patient Stated Goal: continue going on walks OT Goal Formulation: With patient/family Time For Goal Achievement: 01/16/18 Potential to  Achieve Goals: Good  OT Frequency: Min 2X/week   Barriers to D/C:            Co-evaluation              AM-PAC PT "6 Clicks" Daily Activity     Outcome Measure Help from another person eating meals?: None Help from another person taking care of personal grooming?: None Help from another person toileting, which includes using toliet, bedpan, or urinal?: A Little Help from another person bathing (including washing, rinsing, drying)?: A Little Help from another person to put on and taking off regular upper body clothing?: None Help from another person to put on and taking off regular lower body clothing?: None 6 Click Score: 22   End of Session Equipment Utilized During Treatment: Oxygen(2L) Nurse Communication: Mobility status  Activity Tolerance: Patient tolerated treatment well Patient left: in chair;with call bell/phone within reach;with chair alarm set  OT Visit Diagnosis: Unsteadiness on feet (R26.81);Other symptoms and signs involving cognitive function                Time: 0940-1010 OT Time Calculation (min): 30 min Charges:  OT General Charges $OT Visit: 1 Visit OT Evaluation $OT Eval Moderate Complexity: 1 Mod OT Treatments $Self Care/Home Management : 8-22 mins G-Codes:     Maurie Boettcher, OT/L  OT Clinical Specialist [phone]   [facility] 01/02/2018, 1:37 PM

## 2018-01-02 NOTE — Telephone Encounter (Signed)
Attempted to call patient's son at [phone], unable to leave message because voicemail was full.  Called and left detailed message  on [phone], home number, for son to call back to schedule appointment in 2 weeks with BQ or NP.

## 2018-01-02 NOTE — Progress Notes (Signed)
Patient admitted from ED via bed. Patient is alert and oriented x3. Vital signs are stable. Skin assessment done with another nurse. Patient denies for pain and no distress noted. Iv in place and running fluid. Patient given instruction about call bell and phone. Bed in low position and side rail up x2. Call bell in reach.

## 2018-01-02 NOTE — Progress Notes (Signed)
PROGRESS NOTE    Antonio Ray  PFX:[id number] DOB: [birth year] DOA: 01/01/2018 PCP: [name], MD  Brief Narrative: 84 year old male with history of diabetes mellitus, CAD, ischemic cardiomyopathy EF 25%, stage III CK D, mild cognitive dysfunction, history of dysphasia/a chalasia, recurrent hospitalizations with aspiration pneumonia admitted with fever, confusion, weakness. -found to have multifocal pneumonia, and diffuse distention of esophagus with solid material on CT -Started on broad-spectrum antibiotics, gastroenterology consulted  Assessment & Plan:   Multifocal pneumonia/was likely aspiration related -currently on vancomycin and Zosyn -If cultures remain negative will stop Vancomycin and continue Zosyn -long-standing history of dysphagia/a chalasia, see below  History of dysphagia/achalasia -prior history of pneumatic dilation of the esophagus in 2017 -Followed by a GI Dr.Nandigam -barium esophagram 4/019 noted high-grade stricture at GE junction -CT chest 6/3: noted Diffuse distention of the esophagus with solid material -will keep NPO and request gastroenterology evaluation today for possible EGD and dilation   Chronic kidney disease stage III: -creatinine close to baseline of 1.6-2 -Gentle IV fluids while NPO  CAD/ICM/chronic systolic CHF,  -EF of 32% by echo -compensated, Lasix on hold, gentle IV fluids for now  COPD: -stable, nebs PRN -Continue with bronchodilators as ordered.   DM-2: -last hemoglobin A1c is 6.5, hold Lantus -Gentle IV fluids with D5 while nothing by mouth   Mild dementia/cognitive dysfunction: -stable, appropriate, mild short-term memory deficits -continue Aricept and Namenda  DVT prophylaxis: lovenox Code Status: Full COde Family Communication: None at bedside Disposition Plan: Home pending improvement and eval of dysphagia  Consultants:   [facility]   Procedures:   Antimicrobials:  Antibiotics Given (last 72 hours)    Date/Time Action Medication Dose Rate   01/01/18 1804 New Bag/Given   vancomycin (VANCOCIN) 1,250 mg in sodium chloride 0.9 % 250 mL IVPB 1,250 mg 166.7 mL/hr   01/01/18 1804 New Bag/Given   piperacillin-tazobactam (ZOSYN) IVPB 3.375 g 3.375 g 100 mL/hr   01/01/18 2315 New Bag/Given   azithromycin (ZITHROMAX) 500 mg in sodium chloride 0.9 % 250 mL IVPB 500 mg 250 mL/hr   01/02/18 0114 New Bag/Given   piperacillin-tazobactam (ZOSYN) IVPB 3.375 g 3.375 g 12.5 mL/hr   01/02/18 0920 New Bag/Given   piperacillin-tazobactam (ZOSYN) IVPB 3.375 g 3.375 g 12.5 mL/hr      Subjective: -feels better today, denies any complaints, reports mild cough only -Denies any dyspnea, no significant change in chronic dysphagia, has intermittent regurgitation of solids  Objective: Vitals:   01/02/18 0520 01/02/18 0522 01/02/18 0846 01/02/18 1216  BP: (!) 88/48 (!) 92/42    Pulse: 60 60 64   Resp: 18  17 18   Temp: 97.8 F (36.6 C)   (!) 97.4 F (36.3 C)  TempSrc:    Oral  SpO2: 100%  99%   Weight:      Height:        Intake/Output Summary (Last 24 hours) at 01/02/2018 1301 Last data filed at 01/02/2018 0912 Gross per 24 hour  Intake 2107.9 ml  Output 400 ml  Net 1707.9 ml   Filed Weights   01/01/18 1542  Weight: 70.8 kg (156 lb)    Examination:  General exam: Appears calm and comfortable, no distress Respiratory system: scattered rhonchi, right greater than left Cardiovascular system: S1 & S2 heard, RRR Gastrointestinal system: Abdomen is nondistended, soft and nontender.Normal bowel sounds heard. Central nervous system: Alert and oriented. No focal neurological deficits. Extremities: Symmetric 5 x 5 power. Skin: No rashes, lesions or ulcers Psychiatry: Judgement  and insight appear normal. Mood & affect appropriate.     Data Reviewed:   CBC: Recent Labs  Lab 01/01/18 1553 01/02/18 0153  WBC 22.0* 16.0*  NEUTROABS 18.8*  --   HGB 12.0* 10.4*  HCT 39.3 34.7*  MCV 89.5 90.6  PLT  267 102   Basic Metabolic Panel: Recent Labs  Lab 01/01/18 1544 01/02/18 0153  NA 142 143  K 4.3 3.8  CL 105 109  CO2 27 28  GLUCOSE 99 159*  BUN 36* 36*  CREATININE 1.99* 1.95*  CALCIUM 9.2 8.4*  MG  --  2.1  PHOS  --  2.8   GFR: Estimated Creatinine Clearance: 28.3 mL/min (A) (by C-G formula based on SCr of 1.95 mg/dL (H)). Liver Function Tests: Recent Labs  Lab 01/01/18 1544 01/02/18 0153  AST 27 21  ALT 15* 15*  ALKPHOS 38 33*  BILITOT 0.7 0.7  PROT 6.7 5.4*  ALBUMIN 3.0* 2.4*   No results for input(s): LIPASE, AMYLASE in the last 168 hours. No results for input(s): AMMONIA in the last 168 hours. Coagulation Profile: Recent Labs  Lab 01/01/18 2231  INR 1.23   Cardiac Enzymes: Recent Labs  Lab 01/01/18 1544  TROPONINI 0.04*   BNP (last 3 results) No results for input(s): PROBNP in the last 8760 hours. HbA1C: Recent Labs    01/02/18 0153  HGBA1C 6.6*   CBG: Recent Labs  Lab 01/01/18 1538 01/01/18 2314 01/02/18 0801 01/02/18 1235  GLUCAP 100* 150* 81 68   Lipid Profile: No results for input(s): CHOL, HDL, LDLCALC, TRIG, CHOLHDL, LDLDIRECT in the last 72 hours. Thyroid Function Tests: Recent Labs    01/02/18 0153  TSH 0.635   Anemia Panel: No results for input(s): VITAMINB12, FOLATE, FERRITIN, TIBC, IRON, RETICCTPCT in the last 72 hours. Urine analysis:    Component Value Date/Time   COLORURINE YELLOW 01/01/2018 [place] 01/01/2018 1705   LABSPEC 1.014 01/01/2018 1705   PHURINE 5.0 01/01/2018 1705   GLUCOSEU NEGATIVE 01/01/2018 1705   HGBUR NEGATIVE 01/01/2018 1705   BILIRUBINUR NEGATIVE 01/01/2018 1705   BILIRUBINUR neg 06/06/2012 1037   KETONESUR NEGATIVE 01/01/2018 1705   PROTEINUR NEGATIVE 01/01/2018 1705   UROBILINOGEN 0.2 05/12/2015 0920   NITRITE NEGATIVE 01/01/2018 1705   LEUKOCYTESUR NEGATIVE 01/01/2018 1705   Sepsis Labs: @LABRCNTIP (procalcitonin:4,lacticidven:4)  )No results found for this or any  previous visit (from the past 240 hour(s)).       Radiology Studies: Dg Chest 2 View  Result Date: 01/01/2018 CLINICAL DATA:  Hypoxia EXAM: CHEST - 2 VIEW COMPARISON:  12/19/2017 FINDINGS: Cardiac shadow is stable. Defibrillator is again noted. Lungs are hyper aerated bilaterally. No focal infiltrate or sizable effusion is seen. No acute bony abnormality is noted. IMPRESSION: COPD without acute abnormality. Electronically Signed   By: Inez Catalina M.D.   On: 01/01/2018 16:23   Ct Head Wo Contrast  Result Date: 01/01/2018 CLINICAL DATA:  Worsening mental status with decline. EXAM: CT HEAD WITHOUT CONTRAST TECHNIQUE: Contiguous axial images were obtained from the base of the skull through the vertex without intravenous contrast. COMPARISON:  02/15/2017 FINDINGS: Brain: Generalized age related atrophy. No evidence of old or acute focal infarction, mass lesion, hemorrhage, hydrocephalus or extra-axial collection. Vascular: There is atherosclerotic calcification of the major vessels at the base of the brain. Skull: Normal Sinuses/Orbits: Clear/normal Other: None IMPRESSION: Age related atrophy. No focal or acute finding. No progressive change visible since last year. Electronically Signed   By: Elta Guadeloupe  Shogry M.D.   On: 01/01/2018 16:41   Ct Chest Wo Contrast  Result Date: 01/01/2018 CLINICAL DATA:  Acute onset of cough. Respiratory illness. EXAM: CT CHEST WITHOUT CONTRAST TECHNIQUE: Multidetector CT imaging of the chest was performed following the standard protocol without IV contrast. COMPARISON:  Chest radiograph performed earlier today at 4:26 p.m., and CT of the chest performed 10/04/2017 FINDINGS: Cardiovascular: The heart is normal in size. Diffuse coronary artery calcifications are seen. Scattered calcification is noted along the thoracic aorta and proximal great vessels. The patient's pacemaker/AICD is noted at the left chest wall, with leads ending at the right atrium and right ventricle.  Mediastinum/Nodes: The esophagus is diffusely distended with solid material, measuring up to 6 cm in diameter proximally, concerning for achalasia. A 1.2 cm azygoesophageal recess node is noted. Remaining visualized mediastinal nodes remain normal in size. No pericardial effusion is identified. The thyroid gland is grossly unremarkable. No axillary lymphadenopathy is seen. Lungs/Pleura: New patchy right-sided airspace opacification is compatible with multifocal pneumonia. Atypical infection is a concern. A number of the opacities are peribronchovascular in nature. Mild peripheral scarring is noted bilaterally. No pleural effusion or pneumothorax is identified. Upper Abdomen: The visualized portions of the liver and spleen are unremarkable. The patient is status post cholecystectomy, with clips noted at the gallbladder fossa. The pancreas is somewhat atrophic in appearance. The visualized portions of the adrenal glands are unremarkable. Musculoskeletal: No acute osseous abnormalities are identified. The visualized musculature is unremarkable in appearance. IMPRESSION: 1. New patchy right-sided airspace opacification is compatible with multifocal pneumonia. Atypical infection is a concern. A number of the opacities are peribronchovascular in nature. 2. Diffuse distention of the esophagus with solid material, measuring up to 6 cm in diameter proximally, concerning for achalasia. 3. 1.2 cm azygoesophageal recess node noted. This may reflect the underlying infection. 4. Diffuse coronary artery calcifications seen. Aortic Atherosclerosis (ICD10-I70.0). Electronically Signed   By: Garald Balding M.D.   On: 01/01/2018 22:00        Scheduled Meds: . dextrose      . aspirin  81 mg Oral Daily  . donepezil  10 mg Oral q morning - 10a  . enoxaparin (LOVENOX) injection  30 mg Subcutaneous Q24H  . fenofibrate  160 mg Oral Daily  . guaiFENesin  600 mg Oral BID  . insulin aspart  0-5 Units Subcutaneous QHS  . insulin  aspart  0-9 Units Subcutaneous TID WC  . loratadine  10 mg Oral Daily  . memantine  10 mg Oral BID  . mometasone-formoterol  2 puff Inhalation BID  . pantoprazole  40 mg Oral Daily  . pravastatin  40 mg Oral QHS   Continuous Infusions: . azithromycin Stopped (01/02/18 0015)  . dextrose 5 % and 0.45% NaCl 75 mL/hr at 01/02/18 1232  . piperacillin-tazobactam (ZOSYN)  IV 3.375 g (01/02/18 0920)  . vancomycin       LOS: 0 days    Time spent: 62min    Domenic Polite, MD [facility] Page via [email or web address], password [email or web address] After 7PM please contact night-coverage  01/02/2018, 1:01 PM

## 2018-01-02 NOTE — Telephone Encounter (Signed)
Returned Patient's son Alan's call. Patient is in the hospital at [facility].  [name] stated that CT Chest showed some PNA and wanted to make sure BQ was aware of hospital admission and CT chest.  He stated that they would follow up with BQ once he is discharged from hospital.   Will route to BQ so he is aware

## 2018-01-02 NOTE — Telephone Encounter (Signed)
Please let them know that we are happy to see him, if he would like that he just needs to ask the hospitalist for a pulmonary consult.

## 2018-01-03 ENCOUNTER — Inpatient Hospital Stay (HOSPITAL_COMMUNITY): Payer: Medicare Other | Admitting: Anesthesiology

## 2018-01-03 ENCOUNTER — Encounter (HOSPITAL_COMMUNITY)
Admission: EM | Disposition: A | Discharge status: Home / Self Care | Payer: Self-pay | Source: Home / Self Care | Attending: Internal Medicine

## 2018-01-03 ENCOUNTER — Inpatient Hospital Stay (HOSPITAL_COMMUNITY): Payer: Medicare Other

## 2018-01-03 ENCOUNTER — Encounter (HOSPITAL_COMMUNITY): Payer: Self-pay | Admitting: *Deleted

## 2018-01-03 DIAGNOSIS — J449 Chronic obstructive pulmonary disease, unspecified: Secondary | ICD-10-CM

## 2018-01-03 DIAGNOSIS — K228 Other specified diseases of esophagus: Secondary | ICD-10-CM

## 2018-01-03 HISTORY — PX: SAVORY DILATION: SHX5439

## 2018-01-03 HISTORY — PX: ESOPHAGOGASTRODUODENOSCOPY: SHX5428

## 2018-01-03 LAB — CBC
HEMATOCRIT: 37.1 % — AB (ref 39.0–52.0)
HEMOGLOBIN: 11.1 g/dL — AB (ref 13.0–17.0)
MCH: 27.3 pg (ref 26.0–34.0)
MCHC: 29.9 g/dL — AB (ref 30.0–36.0)
MCV: 91.2 fL (ref 78.0–100.0)
Platelets: 216 10*3/uL (ref 150–400)
RBC: 4.07 MIL/uL — AB (ref 4.22–5.81)
RDW: 15.7 % — ABNORMAL HIGH (ref 11.5–15.5)
WBC: 8.2 10*3/uL (ref 4.0–10.5)

## 2018-01-03 LAB — BASIC METABOLIC PANEL
Anion gap: 4 — ABNORMAL LOW (ref 5–15)
BUN: 22 mg/dL — ABNORMAL HIGH (ref 6–20)
CHLORIDE: 110 mmol/L (ref 101–111)
CO2: 26 mmol/L (ref 22–32)
CREATININE: 1.38 mg/dL — AB (ref 0.61–1.24)
Calcium: 8.1 mg/dL — ABNORMAL LOW (ref 8.9–10.3)
GFR calc Af Amer: 53 mL/min — ABNORMAL LOW (ref >60)
GFR calc non Af Amer: 46 mL/min — ABNORMAL LOW (ref >60)
Glucose, Bld: 109 mg/dL — ABNORMAL HIGH (ref 65–99)
POTASSIUM: 3.8 mmol/L (ref 3.5–5.1)
SODIUM: 140 mmol/L (ref 135–145)

## 2018-01-03 LAB — ANA W/REFLEX IF POSITIVE: Anti Nuclear Antibody(ANA): NEGATIVE

## 2018-01-03 LAB — GLUCOSE, CAPILLARY
GLUCOSE-CAPILLARY: 80 mg/dL (ref 65–99)
Glucose-Capillary: 100 mg/dL — ABNORMAL HIGH (ref 65–99)
Glucose-Capillary: 129 mg/dL — ABNORMAL HIGH (ref 65–99)
Glucose-Capillary: 185 mg/dL — ABNORMAL HIGH (ref 65–99)

## 2018-01-03 LAB — EXPECTORATED SPUTUM ASSESSMENT W GRAM STAIN, RFLX TO RESP C: Special Requests: NORMAL

## 2018-01-03 SURGERY — EGD (ESOPHAGOGASTRODUODENOSCOPY)
Anesthesia: General

## 2018-01-03 MED ORDER — AMPICILLIN-SULBACTAM SODIUM 1.5 (1-0.5) G IJ SOLR
1.5000 g | Freq: Four times a day (QID) | INTRAMUSCULAR | Status: DC
Start: 2018-01-03 — End: 2018-01-05
  Administered 2018-01-03 – 2018-01-05 (×8): 1.5 g via INTRAVENOUS
  Filled 2018-01-03 (×10): qty 1.5

## 2018-01-03 MED ORDER — IOPAMIDOL (ISOVUE-300) INJECTION 61%
INTRAVENOUS | Status: AC
  Filled 2018-01-03: qty 50

## 2018-01-03 MED ORDER — PROPOFOL 10 MG/ML IV BOLUS
INTRAVENOUS | Status: DC | PRN
  Administered 2018-01-03: 100 mg via INTRAVENOUS

## 2018-01-03 MED ORDER — FENTANYL CITRATE (PF) 100 MCG/2ML IJ SOLN
INTRAMUSCULAR | Status: DC | PRN
  Administered 2018-01-03: 50 ug via INTRAVENOUS

## 2018-01-03 MED ORDER — LIDOCAINE HCL (CARDIAC) PF 100 MG/5ML IV SOSY
PREFILLED_SYRINGE | INTRAVENOUS | Status: DC | PRN
  Administered 2018-01-03: 100 mg via INTRATRACHEAL

## 2018-01-03 MED ORDER — LACTATED RINGERS IV SOLN
INTRAVENOUS | Status: AC | PRN
  Administered 2018-01-03: 1000 mL via INTRAVENOUS

## 2018-01-03 MED ORDER — PHENYLEPHRINE HCL 10 MG/ML IJ SOLN
INTRAVENOUS | Status: DC | PRN
  Administered 2018-01-03: 50 ug/min via INTRAVENOUS

## 2018-01-03 MED ORDER — PHENYLEPHRINE 40 MCG/ML (10ML) SYRINGE FOR IV PUSH (FOR BLOOD PRESSURE SUPPORT)
PREFILLED_SYRINGE | INTRAVENOUS | Status: DC | PRN
  Administered 2018-01-03: 40 ug via INTRAVENOUS

## 2018-01-03 MED ORDER — ENOXAPARIN SODIUM 40 MG/0.4ML ~~LOC~~ SOLN
40.0000 mg | SUBCUTANEOUS | Status: DC
  Administered 2018-01-03 – 2018-01-04 (×2): 40 mg via SUBCUTANEOUS
  Filled 2018-01-03 (×2): qty 0.4

## 2018-01-03 MED ORDER — SUCCINYLCHOLINE CHLORIDE 20 MG/ML IJ SOLN
INTRAMUSCULAR | Status: DC | PRN
  Administered 2018-01-03: 100 mg via INTRAVENOUS

## 2018-01-03 NOTE — Transfer of Care (Signed)
Immediate Anesthesia Transfer of Care Note  Patient: Antonio Ray  Procedure(s) Performed: ESOPHAGOGASTRODUODENOSCOPY (EGD) (N/A ) RIGIFLEX DILATION (N/A )  Patient Location: Endoscopy Unit  Anesthesia Type:General  Level of Consciousness: alert   Airway & Oxygen Therapy: Patient Spontanous Breathing and Patient connected to nasal cannula oxygen  Post-op Assessment: Report given to RN and Post -op Vital signs reviewed and stable  Post vital signs: Reviewed and stable  Last Vitals:  Vitals Value Taken Time  BP    Temp    Pulse    Resp    SpO2      Last Pain:  Vitals:   01/03/18 1308  TempSrc: Oral  PainSc: 0-No pain         Complications: No apparent anesthesia complications

## 2018-01-03 NOTE — Anesthesia Procedure Notes (Signed)
Procedure Name: Intubation Date/Time: 01/03/2018 2:13 PM Performed by: Wilburn Cornelia, CRNA Pre-anesthesia Checklist: Patient identified, Emergency Drugs available, Suction available, Patient being monitored and Timeout performed Patient Re-evaluated:Patient Re-evaluated prior to induction Oxygen Delivery Method: Circle system utilized Preoxygenation: Pre-oxygenation with 100% oxygen Induction Type: IV induction and Rapid sequence Laryngoscope Size: Mac and 4 Grade View: Grade I Tube type: Oral Tube size: 7.5 mm Number of attempts: 1 Airway Equipment and Method: Stylet Placement Confirmation: ETT inserted through vocal cords under direct vision,  positive ETCO2,  CO2 detector and breath sounds checked- equal and bilateral Secured at: 23 cm Tube secured with: Tape Dental Injury: Teeth and Oropharynx as per pre-operative assessment

## 2018-01-03 NOTE — Progress Notes (Signed)
Patient admitted back to room after having EGD. Patient responds to voice but drowsy after procedure. VSS and family at bedside. Two new skin tears  Noted one to each arm. Foam dressing applied.

## 2018-01-03 NOTE — Anesthesia Preprocedure Evaluation (Signed)
Anesthesia Evaluation  Patient identified by MRN, date of birth, ID band Patient awake    Reviewed: Allergy & Precautions, NPO status   Airway Mallampati: II  TM Distance: >3 FB Neck ROM: Full    Dental   Pulmonary shortness of breath, pneumonia, COPD, former smoker,    breath sounds clear to auscultation       Cardiovascular + angina + CAD, + Past MI, +CHF and + DOE  + dysrhythmias + Cardiac Defibrillator + Valvular Problems/Murmurs  Rhythm:Regular Rate:Normal  Echo 10/15/2017 - Left ventricle: The cavity size was moderately dilated. Wall thickness was normal. Systolic function was severely reduced. The estimated ejection fraction was in the range of 20% to 25%. Akinesis of the anteroseptal, anterior, and apical myocardium; consistent with infarction in the distribution of the left anterior descending coronary artery. Akinesis and scarring of the mid-apicalinferior and inferoseptal myocardium; consistent with infarction in the distribution of the right coronary artery. Features are consistent with a pseudonormal left ventricular filling pattern, with concomitant abnormal relaxation and increased filling pressure (grade 2 diastolic dysfunction). Acoustic contrast opacification revealed no evidence ofthrombus. - Aortic valve: There was trivial regurgitation. - Mitral valve: There was mild to moderate regurgitation directed centrally. - Left atrium: The atrium was moderately dilated. - Right atrium: The atrium was mildly dilated.   Neuro/Psych    GI/Hepatic GERD  ,  Endo/Other  diabetes  Renal/GU Renal disease     Musculoskeletal   Abdominal   Peds  Hematology   Anesthesia Other Findings   Reproductive/Obstetrics                             Anesthesia Physical  Anesthesia Plan  ASA: III  Anesthesia Plan: General   Post-op Pain Management:    Induction: Intravenous, Rapid sequence and Cricoid  pressure planned  PONV Risk Score and Plan:   Airway Management Planned: Oral ETT  Additional Equipment:   Intra-op Plan:   Post-operative Plan: Possible Post-op intubation/ventilation  Informed Consent: I have reviewed the patients History and Physical, chart, labs and discussed the procedure including the risks, benefits and alternatives for the proposed anesthesia with the patient or authorized representative who has indicated his/her understanding and acceptance.   Dental advisory given  Plan Discussed with: CRNA and Anesthesiologist  Anesthesia Plan Comments:         Anesthesia Quick Evaluation

## 2018-01-03 NOTE — Op Note (Addendum)
[facility] Patient Name: Antonio Ray Procedure Date : 01/03/2018 MRN: [id number] Attending MD: Mauri Pole , MD Date of Birth: 1933/10/19 CSN: [id number] Age: 84 Admit Type: Inpatient Procedure:                Upper GI endoscopy Indications:              Dysphagia, For therapy of achalasia, For balloon                            dilation of achalasia Providers:                Mauri Pole, MD, Cleda Daub, RN, Alan Mulder, Technician, Merrilyn Puma, CRNA Referring MD:              Medicines:                Monitored Anesthesia Care Complications:            No immediate complications. Estimated Blood Loss:     Estimated blood loss was minimal. Procedure:                Pre-Anesthesia Assessment:                           - Prior to the procedure, a History and Physical                            was performed, and patient medications and                            allergies were reviewed. The patient's tolerance of                            previous anesthesia was also reviewed. The risks                            and benefits of the procedure and the sedation                            options and risks were discussed with the patient.                            All questions were answered, and informed consent                            was obtained. Prior Anticoagulants: The patient has                            taken no previous anticoagulant or antiplatelet                            agents. ASA Grade Assessment: III - A patient with  severe systemic disease. After reviewing the risks                            and benefits, the patient was deemed in                            satisfactory condition to undergo the procedure.                           After obtaining informed consent, the endoscope was                            passed under direct vision. Throughout the        procedure, the patient's blood pressure, pulse, and                            oxygen saturations were monitored continuously. The                            EG-[id number] ([id number]) scope was introduced through the                            mouth, and advanced to the second part of duodenum.                            The upper GI endoscopy was accomplished without                            difficulty. The patient tolerated the procedure                            well. Scope In: Scope Out: Findings:      The lumen of the esophagus was severely dilated.      Moderate amount of fluid was found in the entire esophagus.      The stomach was normal.      The examined duodenum was normal.      The Z-line was regular and was found 45 cm from the incisors. A guide       wire was placed, then the scope was withdrawn. Using the wire as a       guide, dilation with a 30 mm pneumatic balloon was performed under       fluoroscopic guidance. The balloon was inflated to 12 PSI with complete       effacement of the balloon waist. Pressure was held for 60 seconds before       balloon deflation and withdrawal. There was a small amount of blood on       the balloon. Impression:               - Dilation in the entire esophagus.                           - Fluid in the esophagus.                           - Normal  stomach.                           - Normal examined duodenum.                           - Z-line regular, 45 cm from the incisors. Dilated.                           - No specimens collected. Recommendation:           - Patient has a contact number available for                            emergencies. The signs and symptoms of potential                            delayed complications were discussed with the                            patient. Return to normal activities tomorrow.                            Written discharge instructions were provided to the                             patient.                           - Continue present medications.                           - Follow an antireflux regimen indefinitely.                           - Return to GI clinic in 2 months.                           - Clear liquid diet today, then advance as                            tolerated to mechanical soft diet for 5 days. Procedure Code(s):        --- Professional ---                           [phone], Esophagogastroduodenoscopy, flexible,                            transoral; with dilation of esophagus with balloon                            (30 mm diameter or larger) (includes fluoroscopic                            guidance, when performed) Diagnosis Code(s):        --- Professional ---  K22.8, Other specified diseases of esophagus                           R13.10, Dysphagia, unspecified                           K22.0, Achalasia of cardia CPT copyright 2017 American [employer] Association. All rights reserved. The codes documented in this report are preliminary and upon coder review may  be revised to meet current compliance requirements. Mauri Pole, MD 01/03/2018 3:12:57 PM This report has been signed electronically. Number of Addenda: 0

## 2018-01-03 NOTE — Progress Notes (Signed)
Endoscopy schedule today allows EGD under GETA for therapy of the achalasia.  Dr. Silverio Decamp has agreed to perform the procedure with balloon dilation.  Patient is aware after our PA visited this AM.  I also spoke to his son Antonio Ray just now and he is agreeable.  He will call his father to let him know we have all communicated and be sure he feels comfortable proceeding.  Antonio Ray tells me his father gives consent for procedures, but understandably likes to discuss with family first.

## 2018-01-03 NOTE — H&P (Signed)
[facility] History and Physical   Primary Care Physician:  [name], MD   Reason for Procedure:   Achalasia, dysphagia, food impaction in esophagus  Plan:    EGD with disimpaction, possible botox injection at EG junction, pneumatic balloon dilation    HPI: Antonio Ray is a 84 y.o. male with multiple comorbidities COPD, CHF, achalasia with worsening dysphagia here for EGD with possible pneumatic balloon dilation.   Past Medical History:  Diagnosis Date  . Achalasia   . AICD (automatic cardioverter/defibrillator) present 03/30/2011   Analyze ST study patient  . Anginal pain ([place])   . BPH (benign prostatic hyperplasia)   . CHF (congestive heart failure) ([place])   . Chronic systolic dysfunction of left ventricle    EF 30-35%, CLASS II - III SYMPTOMS; intolerant to Coreg  . CKD (chronic kidney disease) stage 3, GFR 30-59 ml/min ([name]) 06/08/2012  . COPD (chronic obstructive pulmonary disease) ([place])   . Coronary artery disease    History of remote anterior MI with PCI to LAD in 2006; most recent cath 2007, no intervention required  . DOE (dyspnea on exertion)    with heavy exertion  . Dysrhythmia   . Esophageal dysmotility   . GERD (gastroesophageal reflux disease)   . Head injury, closed, with concussion 2000ish  . Heart murmur    hx  . HOH (hard of hearing)   . Hyperlipidemia   . Memory loss    improved  . Myocardial infarction ([place]) 1994   ANTERIOR  . Pneumonia "several times"   aspiration pna with at least 3 admits for this 2016.   Marland Kitchen PVC's (premature ventricular contractions)   . Renal failure   . Skin cancer "several"   "forearms; head"  . Type II diabetes mellitus ([place])     Past Surgical History:  Procedure Laterality Date  . BALLOON DILATION N/A 09/09/2015   Procedure: BALLOON DILATION;  Surgeon: Mauri Pole, MD;  Location: [facility];  Service: Endoscopy;  Laterality: N/A;  rigiflex achalasia balloon dilators  . BOTOX INJECTION N/A  04/08/2015   Procedure: BOTOX INJECTION;  Surgeon: Milus Banister, MD;  Location: [facility];  Service: Endoscopy;  Laterality: N/A;  . CARDIAC CATHETERIZATION  01/11/2006   DEMONSTRATES AKINESIA OF THE DISTAL ANTERIOR WALL, DISTAL INFERIOR WALL AND AKINESIA OF THE APEX. THE BASAL SEGMENTS CONTRACT WELL AND OVERALL EF 35%  . CATARACT EXTRACTION W/ INTRAOCULAR LENS  IMPLANT, BILATERAL  08/2014-09/2014  . CHOLECYSTECTOMY  2/11  . CORONARY ANGIOPLASTY  1994   TO THE LAD  . ESOPHAGEAL MANOMETRY N/A 03/16/2015   Procedure: ESOPHAGEAL MANOMETRY (EM);  Surgeon: Jerene Bears, MD;  Location: [facility];  Service: Gastroenterology;  Laterality: N/A;  . ESOPHAGOGASTRODUODENOSCOPY N/A 04/08/2015   Procedure: ESOPHAGOGASTRODUODENOSCOPY (EGD);  Surgeon: Milus Banister, MD;  Location: [facility];  Service: Endoscopy;  Laterality: N/A;  . ESOPHAGOGASTRODUODENOSCOPY (EGD) WITH PROPOFOL N/A 09/09/2015   Procedure: ESOPHAGOGASTRODUODENOSCOPY (EGD) WITH PROPOFOL;  Surgeon: Mauri Pole, MD;  Location: [facility];  Service: Endoscopy;  Laterality: N/A;  pt. is to have gastrografin xray post recovery-do not discharge until results are back  . EYE SURGERY    . FOOT FRACTURE SURGERY Right 1980's  . INSERT / REPLACE / REMOVE PACEMAKER    . SKIN CANCER EXCISION  "several"   "forearms, head"    Prior to Admission medications   Medication Sig Start Date End Date Taking? Authorizing Provider  albuterol (PROVENTIL HFA;VENTOLIN HFA) 108 (90 Base) MCG/ACT inhaler  Inhale 2 puffs into the lungs every 6 (six) hours as needed for wheezing or shortness of breath. 11/16/17  Yes Juanito Doom, MD  aspirin 81 MG EC tablet Take 81 mg by mouth daily.     Yes [provider]  Cholecalciferol (VITAMIN D) 2000 units tablet Take 2,000 Units by mouth daily.    Yes [provider]  donepezil (ARICEPT) 10 MG tablet Take 1 tablet (10 mg total) by mouth every morning. 05/01/17  Yes Marcial Pacas, MD  fenofibrate  (TRICOR) 145 MG tablet TAKE 1 TABLET BY MOUTH EVERY DAY Patient taking differently: Take 145 mg by mouth once a day 02/10/17  Yes Martinique, Peter M, MD  finasteride (PROSCAR) 5 MG tablet Take 5 mg by mouth daily. 05/19/17  Yes [provider]  furosemide (LASIX) 20 MG tablet Take 1 tablet (20 mg total) by mouth daily. 10/16/17  Yes Regalado, Belkys A, MD  insulin aspart (NOVOLOG) 100 UNIT/ML injection Inject 4-10 Units into the skin 3 (three) times daily before meals.   Yes [provider]  Insulin Glargine (BASAGLAR KWIKPEN) 100 UNIT/ML SOPN INJECT 5 UNITS UNDER THE SKIN EVERY MORNING Patient taking differently: Inject 8 Units into the skin every morning.  10/16/17  Yes Regalado, Belkys A, MD  loratadine (CLARITIN) 10 MG tablet Take 1 tablet (10 mg total) by mouth daily. 07/02/15  Yes Barton Dubois, MD  memantine (NAMENDA) 10 MG tablet Take 1 tablet (10 mg total) by mouth 2 (two) times daily. 05/01/17  Yes Marcial Pacas, MD  Multiple Vitamins-Minerals (PRESERVISION AREDS 2) CAPS Take 1 capsule by mouth 2 (two) times daily.   Yes [provider]  nitroGLYCERIN (NITRODUR - DOSED IN MG/24 HR) 0.2 mg/hr patch Place 1 patch (0.2 mg total) onto the skin daily. Alternates patch placement with odd/even days. Patient taking differently: Place 0.2 mg onto the skin daily.  11/22/17  Yes Martinique, Peter M, MD  pantoprazole (PROTONIX) 40 MG tablet Take 1 tablet (40 mg total) by mouth 2 (two) times daily. Patient taking differently: Take 40 mg by mouth daily.  10/16/17  Yes Regalado, Belkys A, MD  pravastatin (PRAVACHOL) 40 MG tablet Take 1 tablet (40 mg total) by mouth at bedtime. 05/26/17  Yes Martinique, Peter M, MD  SPIRIVA HANDIHALER 18 MCG inhalation capsule INHALE CONTENTS OF 1 CAPSULE VIA HANDIHALER ONCE DAILY 12/27/17  Yes Juanito Doom, MD  SYMBICORT 160-4.5 MCG/ACT inhaler INHALE 2 PUFFS BY MOUTH TWICE DAILY Patient taking differently: Inhale 2 puffs into the lungs two times a day  07/31/17  Yes Juanito Doom, MD    Current Facility-Administered Medications  Medication Dose Route Frequency Provider Last Rate Last Dose  . [MAR Hold] acetaminophen (TYLENOL) tablet 650 mg  650 mg Oral Q6H PRN Toy Baker, MD       Or  . Doug Sou Hold] acetaminophen (TYLENOL) suppository 650 mg  650 mg Rectal Q6H PRN Toy Baker, MD      . Doug Sou Hold] albuterol (PROVENTIL) (2.5 MG/3ML) 0.083% nebulizer solution 2.5 mg  2.5 mg Nebulization Q2H PRN Doutova, Anastassia, MD      . ampicillin-sulbactam (UNASYN) 1.5 g in sodium chloride 0.9 % 100 mL IVPB  1.5 g Intravenous Q6H Ghimire, Henreitta Leber, MD      . Doug Sou Hold] aspirin chewable tablet 81 mg  81 mg Oral Daily Doutova, Anastassia, MD      . dextrose 5 %-0.45 % sodium chloride infusion   Intravenous Continuous Vena Rua, PA-C 75  mL/hr at 01/03/18 0548    . [MAR Hold] donepezil (ARICEPT) tablet 10 mg  10 mg Oral q morning - 10a Doutova, Anastassia, MD      . Doug Sou Hold] enoxaparin (LOVENOX) injection 40 mg  40 mg Subcutaneous Q24H Ghimire, Henreitta Leber, MD      . Doug Sou Hold] fenofibrate tablet 160 mg  160 mg Oral Daily Doutova, Anastassia, MD      . Doug Sou Hold] guaiFENesin (MUCINEX) 12 hr tablet 600 mg  600 mg Oral BID Toy Baker, MD   600 mg at 01/01/18 2316  . [MAR Hold] insulin aspart (novoLOG) injection 0-5 Units  0-5 Units Subcutaneous QHS Doutova, Anastassia, MD      . Doug Sou Hold] insulin aspart (novoLOG) injection 0-9 Units  0-9 Units Subcutaneous TID WC Doutova, Anastassia, MD      . lactated ringers infusion    Continuous PRN Harl Bowie V, MD   1,000 mL at 01/03/18 1313  . [MAR Hold] loratadine (CLARITIN) tablet 10 mg  10 mg Oral Daily Doutova, Anastassia, MD      . Doug Sou Hold] memantine (NAMENDA) tablet 10 mg  10 mg Oral BID Toy Baker, MD   10 mg at 01/01/18 2315  . [MAR Hold] mometasone-formoterol (DULERA) 200-5 MCG/ACT inhaler 2 puff  2 puff Inhalation BID Toy Baker, MD   2 puff at  01/03/18 0817  . [MAR Hold] ondansetron (ZOFRAN) tablet 4 mg  4 mg Oral Q6H PRN Toy Baker, MD       Or  . Doug Sou Hold] ondansetron (ZOFRAN) injection 4 mg  4 mg Intravenous Q6H PRN Doutova, Anastassia, MD      . Doug Sou Hold] pantoprazole (PROTONIX) EC tablet 40 mg  40 mg Oral Daily Doutova, Anastassia, MD      . Doug Sou Hold] pravastatin (PRAVACHOL) tablet 40 mg  40 mg Oral QHS Doutova, Anastassia, MD   40 mg at 01/01/18 2315    Allergies as of 01/01/2018 - Review Complete 01/01/2018  Allergen Reaction Noted  . Codeine Other (See Comments)   . Dilaudid [hydromorphone hcl] Other (See Comments) 09/10/2013  . Flomax [tamsulosin hcl] Shortness Of Breath 05/19/2012  . Morphine and related Other (See Comments) 05/20/2011  . Sulfa antibiotics Shortness Of Breath 05/23/2012  . Beta adrenergic blockers Other (See Comments) 03/31/2015  . Carvedilol Other (See Comments) 04/18/2011    Family History  Problem Relation Age of Onset  . Stroke Father        Mother history unknown - never met her  . Venous thrombosis Sister   . Colon cancer Neg Hx     Social History   Socioeconomic History  . Marital status: Widowed    Spouse name: Not on file  . Number of children: 2  . Years of education: GED  . Highest education level: Not on file  Occupational History  . Occupation: Clinical biochemist: [employer]    Comment: Retired  . Occupation: [employer]    Employer: [employer]    Comment: Retired  Scientific laboratory technician  . Financial resource strain: Patient refused  . Food insecurity:    Worry: Never true    Inability: Never true  . Transportation needs:    Medical: No    Non-medical: No  Tobacco Use  . Smoking status: Former Smoker    Packs/day: 1.00    Years: 35.00    Pack years: 35.00    Types: Cigarettes    Last attempt to quit: 08/01/1992  Years since quitting: 25.4  . Smokeless tobacco: Former Systems developer    Quit date: 1994  Substance and Sexual Activity  . Alcohol use:  No    Alcohol/week: 0.0 oz  . Drug use: No  . Sexual activity: Not Currently  Lifestyle  . Physical activity:    Days per week: Patient refused    Minutes per session: Patient refused  . Stress: Patient refused  Relationships  . Social connections:    Talks on phone: Not on file    Gets together: Not on file    Attends religious service: Not on file    Active member of club or organization: Not on file    Attends meetings of clubs or organizations: Not on file    Relationship status: Not on file  . Intimate partner violence:    Fear of current or ex partner: Not on file    Emotionally abused: Not on file    Physically abused: Not on file    Forced sexual activity: Not on file  Other Topics Concern  . Not on file  Social History Narrative   Pt lives in [place] alone.  Widowed.   Right-handed.   Rare caffeine use - maybe one cup per month.    Review of Systems:  All other review of systems negative except as mentioned in the HPI.  Physical Exam: Vital signs in last 24 hours: Temp:  [98.1 F (36.7 C)-98.2 F (36.8 C)] 98.2 F (36.8 C) (06/05 0629) Pulse Rate:  [53-61] 61 (06/05 0817) Resp:  [18-22] 22 (06/05 1308) BP: (107-117)/(48-60) 117/52 (06/05 1308) SpO2:  [95 %-100 %] 100 % (06/05 1308) Weight:  [156 lb (70.8 kg)] 156 lb (70.8 kg) (06/05 1308) Last BM Date: 01/02/18 General:   Alert,   pleasant and cooperative in NAD Lungs:  B/l crackles basal Heart:  s1s2 Regular rate and rhythm Abdomen:  Soft, nontender and nondistended. Normal bowel sounds.   Neuro/Psych:  Alert and cooperative. Normal mood and affect. A and O x 3   K. Denzil Magnuson , MD [phone]

## 2018-01-03 NOTE — Progress Notes (Signed)
PROGRESS NOTE        [name] Age: 84 y.o. Sex: male Date of Birth: 1933-08-16 Admit Date: 01/01/2018 Admitting Physician Toy Baker, MD [id number], Jannifer Rodney, MD  Brief Narrative: Patient is a 84 y.o. male with history of ischemic cardiomyopathy (EF 25%), severe achalasia, recurrent hospitalizations for aspiration pneumonia-presented with aspiration pneumonitis.  See below for further details  Subjective: Sitting up in chair at bedside-no major issues overnight.  Denies any chest pain or shortness of breath.  Assessment/Plan: Aspiration pneumonia:improved-afebrile-no leukocytosis-do not think patient requires vancomycin-continue Zosyn.  High likelihood that this aspiration is related to underlying severe achalasia.  Achalasia: Follows with Dr. Silverio Decamp in the outpatient setting-prior barium esophagogram in April of this year showed a high-grade stricture at the GE junction, CT scan chest on 6/3 with significant distention of the esophagus-subsequently gastroenterology was consulted, for endoscopy evaluation under general anesthesia today.  Will await further recommendations from GI.  Chronic kidney disease stage III: Creatinine close to usual baseline.  Follow  Chronic systolic heart failure (EF 20-25% by TTE on 10/15/2017):: Compensated-resume diuretics when diet is more stable.  COPD: Stable with no evidence of exacerbation--continue with bronchodilators  Dyslipidemia: Resume statin and fenofibrate when diet stable  DM-2:CBG stable with SSI, continue home SSI   Mild dementia/cognitive dysfunction: Awake and mostly alert during my exam this morning-continue Aricept and Namenda  DVT Prophylaxis: Prophylactic Lovenox  Code Status: Full code  Family Communication: None at bedside  Disposition Plan: Remain inpatient-home later this week  Antimicrobial agents: Anti-infectives (From admission, onward)   Start      Dose/Rate Route Frequency Ordered Stop   01/02/18 1700  vancomycin (VANCOCIN) IVPB 1000 mg/200 mL premix  Status:  Discontinued     1,000 mg 200 mL/hr over 60 Minutes Intravenous Every 24 hours 01/01/18 1653 01/03/18 0913   01/02/18 0100  [MAR Hold]  piperacillin-tazobactam (ZOSYN) IVPB 3.375 g     (MAR Hold since Wed 01/03/2018 at 1301. Reason: Transfer to a Procedural area.)   3.375 g 12.5 mL/hr over 240 Minutes Intravenous Every 8 hours 01/01/18 1653     01/01/18 2200  [MAR Hold]  azithromycin (ZITHROMAX) 500 mg in sodium chloride 0.9 % 250 mL IVPB     (MAR Hold since Wed 01/03/2018 at 1301. Reason: Transfer to a Procedural area.)   500 mg 250 mL/hr over 60 Minutes Intravenous Every 24 hours 01/01/18 2058     01/01/18 1700  vancomycin (VANCOCIN) 1,250 mg in sodium chloride 0.9 % 250 mL IVPB     1,250 mg 166.7 mL/hr over 90 Minutes Intravenous  Once 01/01/18 1652 01/01/18 1934   01/01/18 1700  piperacillin-tazobactam (ZOSYN) IVPB 3.375 g     3.375 g 100 mL/hr over 30 Minutes Intravenous  Once 01/01/18 1652 01/01/18 1834      Procedures: None  CONSULTS:  GI  Time spent: 25 minutes-Greater than 50% of this time was spent in counseling, explanation of diagnosis, planning of further management, and coordination of care.  MEDICATIONS: Scheduled Meds: . [MAR Hold] aspirin  81 mg Oral Daily  . [MAR Hold] donepezil  10 mg Oral q morning - 10a  . [MAR Hold] enoxaparin (LOVENOX) injection  40 mg Subcutaneous Q24H  . [MAR Hold] fenofibrate  160 mg Oral Daily  . [MAR Hold] guaiFENesin  600 mg Oral BID  . [  MAR Hold] insulin aspart  0-5 Units Subcutaneous QHS  . [MAR Hold] insulin aspart  0-9 Units Subcutaneous TID WC  . [MAR Hold] loratadine  10 mg Oral Daily  . [MAR Hold] memantine  10 mg Oral BID  . [MAR Hold] mometasone-formoterol  2 puff Inhalation BID  . [MAR Hold] pantoprazole  40 mg Oral Daily  . [MAR Hold] pravastatin  40 mg Oral QHS   Continuous Infusions: . [MAR Hold]  azithromycin Stopped (01/03/18 0741)  . dextrose 5 % and 0.45% NaCl 75 mL/hr at 01/03/18 0548  . lactated ringers    . [MAR Hold] piperacillin-tazobactam (ZOSYN)  IV Stopped (01/03/18 1232)   PRN Meds:.[MAR Hold] acetaminophen **OR** [MAR Hold] acetaminophen, [MAR Hold] albuterol, lactated ringers, [MAR Hold] ondansetron **OR** [MAR Hold] ondansetron (ZOFRAN) IV   PHYSICAL EXAM: Vital signs: Vitals:   01/02/18 2136 01/03/18 0629 01/03/18 0817 01/03/18 1308  BP: 112/60 (!) 107/48  (!) 117/52  Pulse: (!) 53 (!) 57 61   Resp: 18 18 18  (!) 22  Temp: 98.1 F (36.7 C) 98.2 F (36.8 C)    TempSrc: Oral   Oral  SpO2: 97% 100% 95% 100%  Weight:    70.8 kg (156 lb)  Height:    5\' 8"  (1.727 m)   Filed Weights   01/01/18 1542 01/03/18 1308  Weight: 70.8 kg (156 lb) 70.8 kg (156 lb)   Body mass index is 23.72 kg/m.   General appearance :Awake, alert, not in any distress Eyes:Pink conjunctiva HEENT: Atraumatic and Normocephalic Neck: supple Resp:Good air entry bilaterally, no added sounds  CVS: S1 S2 regular, no murmurs.  GI: Bowel sounds present, Non tender and not distended with no gaurding, rigidity or rebound.No organomegaly Extremities: B/L Lower Ext shows no edema, both legs are warm to touch Neurology:  speech clear,Non focal, sensation is grossly intact. Musculoskeletal:No digital cyanosis Skin:No Rash, warm and dry Wounds:N/A  I have personally reviewed following labs and imaging studies  LABORATORY DATA: CBC: Recent Labs  Lab 01/01/18 1553 01/02/18 0153 01/03/18 0643  WBC 22.0* 16.0* 8.2  NEUTROABS 18.8*  --   --   HGB 12.0* 10.4* 11.1*  HCT 39.3 34.7* 37.1*  MCV 89.5 90.6 91.2  PLT 267 225 619    Basic Metabolic Panel: Recent Labs  Lab 01/01/18 1544 01/02/18 0153 01/03/18 0643  NA 142 143 140  K 4.3 3.8 3.8  CL 105 109 110  CO2 27 28 26   GLUCOSE 99 159* 109*  BUN 36* 36* 22*  CREATININE 1.99* 1.95* 1.38*  CALCIUM 9.2 8.4* 8.1*  MG  --  2.1  --     PHOS  --  2.8  --     GFR: Estimated Creatinine Clearance: 39.9 mL/min (A) (by C-G formula based on SCr of 1.38 mg/dL (H)).  Liver Function Tests: Recent Labs  Lab 01/01/18 1544 01/02/18 0153  AST 27 21  ALT 15* 15*  ALKPHOS 38 33*  BILITOT 0.7 0.7  PROT 6.7 5.4*  ALBUMIN 3.0* 2.4*   No results for input(s): LIPASE, AMYLASE in the last 168 hours. No results for input(s): AMMONIA in the last 168 hours.  Coagulation Profile: Recent Labs  Lab 01/01/18 2231  INR 1.23    Cardiac Enzymes: Recent Labs  Lab 01/01/18 1544  TROPONINI 0.04*    BNP (last 3 results) No results for input(s): PROBNP in the last 8760 hours.  HbA1C: Recent Labs    01/02/18 0153  HGBA1C 6.6*    CBG: Recent  Labs  Lab 01/02/18 1333 01/02/18 1649 01/02/18 2159 01/03/18 0752 01/03/18 1223  GLUCAP 124* 83 106* 100* 129*    Lipid Profile: No results for input(s): CHOL, HDL, LDLCALC, TRIG, CHOLHDL, LDLDIRECT in the last 72 hours.  Thyroid Function Tests: Recent Labs    01/02/18 0153  TSH 0.635    Anemia Panel: No results for input(s): VITAMINB12, FOLATE, FERRITIN, TIBC, IRON, RETICCTPCT in the last 72 hours.  Urine analysis:    Component Value Date/Time   COLORURINE YELLOW 01/01/2018 1705   APPEARANCEUR CLEAR 01/01/2018 1705   LABSPEC 1.014 01/01/2018 1705   PHURINE 5.0 01/01/2018 1705   GLUCOSEU NEGATIVE 01/01/2018 1705   HGBUR NEGATIVE 01/01/2018 1705   BILIRUBINUR NEGATIVE 01/01/2018 1705   BILIRUBINUR neg 06/06/2012 1037   KETONESUR NEGATIVE 01/01/2018 1705   PROTEINUR NEGATIVE 01/01/2018 1705   UROBILINOGEN 0.2 05/12/2015 0920   NITRITE NEGATIVE 01/01/2018 1705   LEUKOCYTESUR NEGATIVE 01/01/2018 1705    Sepsis Labs: Lactic Acid, Venous    Component Value Date/Time   LATICACIDVEN 1.2 01/02/2018 0153    MICROBIOLOGY: Recent Results (from the past 240 hour(s))  Urine culture     Status: None   Collection Time: 01/01/18  5:05 PM  Result Value Ref Range  Status   Specimen Description URINE, RANDOM  Final   Special Requests NONE  Final   Culture   Final    NO GROWTH Performed at [facility], [place] [address] [place], [place] [address]    Report Status 01/02/2018 FINAL  Final  Blood Cultures (routine x 2)     Status: None (Preliminary result)   Collection Time: 01/01/18  5:26 PM  Result Value Ref Range Status   Specimen Description BLOOD LEFT HAND  Final   Special Requests   Final    BOTTLES DRAWN AEROBIC AND ANAEROBIC Blood Culture adequate volume   Culture   Final    NO GROWTH < 24 HOURS Performed at [facility], [place] [address] [place], [place] [address]    Report Status PENDING  Incomplete  Blood Cultures (routine x 2)     Status: None (Preliminary result)   Collection Time: 01/01/18  5:26 PM  Result Value Ref Range Status   Specimen Description BLOOD RIGHT FOREARM  Final   Special Requests   Final    BOTTLES DRAWN AEROBIC AND ANAEROBIC Blood Culture adequate volume   Culture   Final    NO GROWTH < 24 HOURS Performed at [facility], [place] [address], [place], [place] [address]    Report Status PENDING  Incomplete    RADIOLOGY STUDIES/RESULTS: Dg Chest 2 View  Result Date: 01/01/2018 CLINICAL DATA:  Hypoxia EXAM: CHEST - 2 VIEW COMPARISON:  12/19/2017 FINDINGS: Cardiac shadow is stable. Defibrillator is again noted. Lungs are hyper aerated bilaterally. No focal infiltrate or sizable effusion is seen. No acute bony abnormality is noted. IMPRESSION: COPD without acute abnormality. Electronically Signed   By: Inez Catalina M.D.   On: 01/01/2018 16:23   Dg Chest 2 View  Result Date: 12/19/2017 CLINICAL DATA:  Generalized weakness and fever. EXAM: CHEST - 2 VIEW COMPARISON:  10/14/2017. FINDINGS: Unchanged cardiomediastinal silhouette. Dual lead pacer is stable. Upper lobe RIGHT greater than LEFT pulmonary opacities do not appear to have progressed. No consolidation or edema. No effusion or pneumothorax. IMPRESSION:  Stable chest. Electronically Signed   By: Staci Righter M.D.   On: 12/19/2017 15:36   Ct Head Wo Contrast  Result  Date: 01/01/2018 CLINICAL DATA:  Worsening mental status with decline. EXAM: CT HEAD WITHOUT CONTRAST TECHNIQUE: Contiguous axial images were obtained from the base of the skull through the vertex without intravenous contrast. COMPARISON:  02/15/2017 FINDINGS: Brain: Generalized age related atrophy. No evidence of old or acute focal infarction, mass lesion, hemorrhage, hydrocephalus or extra-axial collection. Vascular: There is atherosclerotic calcification of the major vessels at the base of the brain. Skull: Normal Sinuses/Orbits: Clear/normal Other: None IMPRESSION: Age related atrophy. No focal or acute finding. No progressive change visible since last year. Electronically Signed   By: Nelson Chimes M.D.   On: 01/01/2018 16:41   Ct Chest Wo Contrast  Result Date: 01/01/2018 CLINICAL DATA:  Acute onset of cough. Respiratory illness. EXAM: CT CHEST WITHOUT CONTRAST TECHNIQUE: Multidetector CT imaging of the chest was performed following the standard protocol without IV contrast. COMPARISON:  Chest radiograph performed earlier today at 4:26 p.m., and CT of the chest performed 10/04/2017 FINDINGS: Cardiovascular: The heart is normal in size. Diffuse coronary artery calcifications are seen. Scattered calcification is noted along the thoracic aorta and proximal great vessels. The patient's pacemaker/AICD is noted at the left chest wall, with leads ending at the right atrium and right ventricle. Mediastinum/Nodes: The esophagus is diffusely distended with solid material, measuring up to 6 cm in diameter proximally, concerning for achalasia. A 1.2 cm azygoesophageal recess node is noted. Remaining visualized mediastinal nodes remain normal in size. No pericardial effusion is identified. The thyroid gland is grossly unremarkable. No axillary lymphadenopathy is seen. Lungs/Pleura: New patchy right-sided  airspace opacification is compatible with multifocal pneumonia. Atypical infection is a concern. A number of the opacities are peribronchovascular in nature. Mild peripheral scarring is noted bilaterally. No pleural effusion or pneumothorax is identified. Upper Abdomen: The visualized portions of the liver and spleen are unremarkable. The patient is status post cholecystectomy, with clips noted at the gallbladder fossa. The pancreas is somewhat atrophic in appearance. The visualized portions of the adrenal glands are unremarkable. Musculoskeletal: No acute osseous abnormalities are identified. The visualized musculature is unremarkable in appearance. IMPRESSION: 1. New patchy right-sided airspace opacification is compatible with multifocal pneumonia. Atypical infection is a concern. A number of the opacities are peribronchovascular in nature. 2. Diffuse distention of the esophagus with solid material, measuring up to 6 cm in diameter proximally, concerning for achalasia. 3. 1.2 cm azygoesophageal recess node noted. This may reflect the underlying infection. 4. Diffuse coronary artery calcifications seen. Aortic Atherosclerosis (ICD10-I70.0). Electronically Signed   By: Garald Balding M.D.   On: 01/01/2018 22:00     LOS: 1 day   Oren Binet, MD  [facility]  If 7PM-7AM, please contact night-coverage  Please page via [email or web address]-click on MD name and type text message  01/03/2018, 1:35 PM

## 2018-01-04 ENCOUNTER — Encounter (HOSPITAL_COMMUNITY): Payer: Self-pay | Admitting: Gastroenterology

## 2018-01-04 LAB — CBC
HCT: 35.2 % — ABNORMAL LOW (ref 39.0–52.0)
HEMOGLOBIN: 10.6 g/dL — AB (ref 13.0–17.0)
MCH: 27.5 pg (ref 26.0–34.0)
MCHC: 30.1 g/dL (ref 30.0–36.0)
MCV: 91.4 fL (ref 78.0–100.0)
Platelets: 201 10*3/uL (ref 150–400)
RBC: 3.85 MIL/uL — ABNORMAL LOW (ref 4.22–5.81)
RDW: 15.3 % (ref 11.5–15.5)
WBC: 7 10*3/uL (ref 4.0–10.5)

## 2018-01-04 LAB — BASIC METABOLIC PANEL
ANION GAP: 8 (ref 5–15)
BUN: 14 mg/dL (ref 6–20)
CHLORIDE: 109 mmol/L (ref 101–111)
CO2: 28 mmol/L (ref 22–32)
Calcium: 8.3 mg/dL — ABNORMAL LOW (ref 8.9–10.3)
Creatinine, Ser: 1.3 mg/dL — ABNORMAL HIGH (ref 0.61–1.24)
GFR calc Af Amer: 57 mL/min — ABNORMAL LOW (ref >60)
GFR calc non Af Amer: 49 mL/min — ABNORMAL LOW (ref >60)
GLUCOSE: 87 mg/dL (ref 65–99)
POTASSIUM: 3.9 mmol/L (ref 3.5–5.1)
Sodium: 145 mmol/L (ref 135–145)

## 2018-01-04 LAB — GLUCOSE, CAPILLARY
GLUCOSE-CAPILLARY: 80 mg/dL (ref 65–99)
GLUCOSE-CAPILLARY: 112 mg/dL — AB (ref 65–99)
Glucose-Capillary: 167 mg/dL — ABNORMAL HIGH (ref 65–99)
Glucose-Capillary: 147 mg/dL — ABNORMAL HIGH (ref 65–99)

## 2018-01-04 MED ORDER — BUPIVACAINE-EPINEPHRINE (PF) 0.25% -1:200000 IJ SOLN
INTRAMUSCULAR | Status: AC
  Filled 2018-01-04: qty 30

## 2018-01-04 MED ORDER — FUROSEMIDE 40 MG PO TABS
40.0000 mg | ORAL_TABLET | Freq: Every day | ORAL | Status: DC
  Administered 2018-01-04 – 2018-01-05 (×2): 40 mg via ORAL
  Filled 2018-01-04 (×2): qty 1

## 2018-01-04 NOTE — Care Management Note (Signed)
Case Management Note  Patient Details  Name: Antonio Ray MRN: [id number] Date of Birth: October 19, 1933  Subjective/Objective:    Pt admitted with AMS - aspiration PNA - s/p egd with dilation               Action/Plan:  PTA independent from home alone.  Pt has PCP and denied barriers to obtaining medications as prescribed.      Expected Discharge Date:                  Expected Discharge Plan:  Home/Self Care  In-House Referral:     Discharge planning Services  CM Consult  Post Acute Care Choice:    Choice offered to:     DME Arranged:    DME Agency:     HH Arranged:    HH Agency:     Status of Service:     If discussed at [employer] of Stay Meetings, dates discussed:    Additional Comments:  Maryclare Labrador, RN 01/04/2018, 11:39 AM

## 2018-01-04 NOTE — Plan of Care (Signed)
Neuro: Pt A&O X4 and able to follow all commands at this time. Neuro exam WNL. Will continue to monitor.    Respiratory: Pt remains on RA with no SOB or DOE at this time. Pt with fine crackles in right lower lobe. Pt given IS and demonstrated proper use.    Cardiovascular: Pt remains in sinus rhythm with no ectopy at this time. Pts BP WNL and pt remains afebrile.   GI/GU: Pt voiding in urinal with adequate urine output. Pt tolerating PO intake and siet increased to solids today. Pt on soft food with thin liquids at this time. Pt with good PO appetite and tolerating all meds and meals at this time.     Skin: Skin intact with no s/s of skin breakdown at this time. Pt able to reposition independently and sat most of the day in chair.   Pain: Pt with no reports of pain.    Events: NO acute events throughout shift. Pts plan of care to continue with current regimen, Family updated and no further questions at this time.

## 2018-01-04 NOTE — Progress Notes (Signed)
PROGRESS NOTE        [name] Age: 84 y.o. Sex: male Date of Birth: 12-Feb-1934 Admit Date: 01/01/2018 Admitting Physician Toy Baker, MD [id number], Jannifer Rodney, MD  Brief Narrative: Patient is a 84 y.o. male with history of ischemic cardiomyopathy (EF 25%), severe achalasia, recurrent hospitalizations for aspiration pneumonia-presented with aspiration pneumonitis.  See below for further details  Subjective: Tolerating liquids denies any shortness of breath.  Assessment/Plan: Aspiration pneumonia: Improving-afebrile, not short of breath-no leukocytosis-continue Unasyn-given underlying severe achalasia-high suspicion for aspiration pneumonia.   Achalasia: Due to above-underwent GI evaluation-and endoscopy on 6/5-underwent dilatation.  Started on a clear liquid diet-which he seems to be tolerating.  Await further input from gastroenterology.  Chronic kidney disease stage III: Creatinine close to usual baseline.  Follow  Chronic systolic heart failure (EF 20-25% by TTE on 10/15/2017): Some more leg edema this morning-since diet has resumed-stop all IV fluids, resume Lasix at 40 mg daily.  Follow weights, intake output.  COPD: Stable-no exacerbation-continue with bronchodilators.    Dyslipidemia: Taking statin and fenofibrate.    DM-2:CBG stable with SSI  Mild dementia/cognitive dysfunction: Awake and mostly alert during my exam this morning-continue Aricept and Namenda  DVT Prophylaxis: Prophylactic Lovenox  Code Status: Full code  Family Communication: None at bedside  Disposition Plan: Remain inpatient-home later this week  Antimicrobial agents: Anti-infectives (From admission, onward)   Start     Dose/Rate Route Frequency Ordered Stop   01/03/18 1700  ampicillin-sulbactam (UNASYN) 1.5 g in sodium chloride 0.9 % 100 mL IVPB     1.5 g 200 mL/hr over 30 Minutes Intravenous Every 6 hours 01/03/18 1357     01/02/18 1700  vancomycin (VANCOCIN) IVPB 1000 mg/200 mL premix  Status:  Discontinued     1,000 mg 200 mL/hr over 60 Minutes Intravenous Every 24 hours 01/01/18 1653 01/03/18 0913   01/02/18 0100  piperacillin-tazobactam (ZOSYN) IVPB 3.375 g  Status:  Discontinued     3.375 g 12.5 mL/hr over 240 Minutes Intravenous Every 8 hours 01/01/18 1653 01/03/18 1357   01/01/18 2200  azithromycin (ZITHROMAX) 500 mg in sodium chloride 0.9 % 250 mL IVPB  Status:  Discontinued     500 mg 250 mL/hr over 60 Minutes Intravenous Every 24 hours 01/01/18 2058 01/03/18 1357   01/01/18 1700  vancomycin (VANCOCIN) 1,250 mg in sodium chloride 0.9 % 250 mL IVPB     1,250 mg 166.7 mL/hr over 90 Minutes Intravenous  Once 01/01/18 1652 01/01/18 1934   01/01/18 1700  piperacillin-tazobactam (ZOSYN) IVPB 3.375 g     3.375 g 100 mL/hr over 30 Minutes Intravenous  Once 01/01/18 1652 01/01/18 1834      Procedures: None  CONSULTS:  GI  Time spent: 25 minutes-Greater than 50% of this time was spent in counseling, explanation of diagnosis, planning of further management, and coordination of care.  MEDICATIONS: Scheduled Meds: . aspirin  81 mg Oral Daily  . donepezil  10 mg Oral q morning - 10a  . enoxaparin (LOVENOX) injection  40 mg Subcutaneous Q24H  . fenofibrate  160 mg Oral Daily  . guaiFENesin  600 mg Oral BID  . insulin aspart  0-5 Units Subcutaneous QHS  . insulin aspart  0-9 Units Subcutaneous TID WC  . loratadine  10 mg Oral Daily  . memantine  10 mg Oral  BID  . mometasone-formoterol  2 puff Inhalation BID  . pantoprazole  40 mg Oral Daily  . pravastatin  40 mg Oral QHS   Continuous Infusions: . ampicillin-sulbactam (UNASYN) IV Stopped (01/04/18 0522)  . dextrose 5 % and 0.45% NaCl 10 mL/hr at 01/04/18 0826   PRN Meds:.acetaminophen **OR** acetaminophen, albuterol, ondansetron **OR** ondansetron (ZOFRAN) IV   PHYSICAL EXAM: Vital signs: Vitals:   01/03/18 2101 01/03/18 2105 01/04/18 0633  01/04/18 0810  BP: (!) 103/47  (!) 106/52   Pulse: 80  60   Resp: 20  16   Temp: 98.3 F (36.8 C)  98 F (36.7 C)   TempSrc: Oral  Oral   SpO2: 96% 96% 100% 100%  Weight:      Height:       Filed Weights   01/01/18 1542 01/03/18 1308  Weight: 70.8 kg (156 lb) 70.8 kg (156 lb)   Body mass index is 23.72 kg/m.   General appearance:Awake, alert, not in any distress.  Eyes:no scleral icterus. HEENT: Atraumatic and Normocephalic Neck: supple, no JVD. Resp:Good air entry bilaterally,no rales or rhonchi CVS: S1 S2 regular GI: Bowel sounds present, Non tender and not distended with no gaurding, rigidity or rebound. Extremities: B/L Lower Ext shows + edema, both legs are warm to touch Neurology:  Non focal Psychiatric: Normal judgment and insight. Normal mood. Musculoskeletal:No digital cyanosis Skin:No Rash, warm and dry Wounds:N/A  I have personally reviewed following labs and imaging studies  LABORATORY DATA: CBC: Recent Labs  Lab 01/01/18 1553 01/02/18 0153 01/03/18 0643 01/04/18 0647  WBC 22.0* 16.0* 8.2 7.0  NEUTROABS 18.8*  --   --   --   HGB 12.0* 10.4* 11.1* 10.6*  HCT 39.3 34.7* 37.1* 35.2*  MCV 89.5 90.6 91.2 91.4  PLT 267 225 216 350    Basic Metabolic Panel: Recent Labs  Lab 01/01/18 1544 01/02/18 0153 01/03/18 0643 01/04/18 0647  NA 142 143 140 145  K 4.3 3.8 3.8 3.9  CL 105 109 110 109  CO2 27 28 26 28   GLUCOSE 99 159* 109* 87  BUN 36* 36* 22* 14  CREATININE 1.99* 1.95* 1.38* 1.30*  CALCIUM 9.2 8.4* 8.1* 8.3*  MG  --  2.1  --   --   PHOS  --  2.8  --   --     GFR: Estimated Creatinine Clearance: 42.4 mL/min (A) (by C-G formula based on SCr of 1.3 mg/dL (H)).  Liver Function Tests: Recent Labs  Lab 01/01/18 1544 01/02/18 0153  AST 27 21  ALT 15* 15*  ALKPHOS 38 33*  BILITOT 0.7 0.7  PROT 6.7 5.4*  ALBUMIN 3.0* 2.4*   No results for input(s): LIPASE, AMYLASE in the last 168 hours. No results for input(s): AMMONIA in the last  168 hours.  Coagulation Profile: Recent Labs  Lab 01/01/18 2231  INR 1.23    Cardiac Enzymes: Recent Labs  Lab 01/01/18 1544  TROPONINI 0.04*    BNP (last 3 results) No results for input(s): PROBNP in the last 8760 hours.  HbA1C: Recent Labs    01/02/18 0153  HGBA1C 6.6*    CBG: Recent Labs  Lab 01/03/18 0752 01/03/18 1223 01/03/18 1721 01/03/18 2107 01/04/18 0753  GLUCAP 100* 129* 80 185* 80    Lipid Profile: No results for input(s): CHOL, HDL, LDLCALC, TRIG, CHOLHDL, LDLDIRECT in the last 72 hours.  Thyroid Function Tests: Recent Labs    01/02/18 0153  TSH 0.635    Anemia Panel:  No results for input(s): VITAMINB12, FOLATE, FERRITIN, TIBC, IRON, RETICCTPCT in the last 72 hours.  Urine analysis:    Component Value Date/Time   COLORURINE YELLOW 01/01/2018 1705   APPEARANCEUR CLEAR 01/01/2018 1705   LABSPEC 1.014 01/01/2018 1705   PHURINE 5.0 01/01/2018 1705   GLUCOSEU NEGATIVE 01/01/2018 1705   HGBUR NEGATIVE 01/01/2018 1705   BILIRUBINUR NEGATIVE 01/01/2018 1705   BILIRUBINUR neg 06/06/2012 1037   KETONESUR NEGATIVE 01/01/2018 1705   PROTEINUR NEGATIVE 01/01/2018 1705   UROBILINOGEN 0.2 05/12/2015 0920   NITRITE NEGATIVE 01/01/2018 1705   LEUKOCYTESUR NEGATIVE 01/01/2018 1705    Sepsis Labs: Lactic Acid, Venous    Component Value Date/Time   LATICACIDVEN 1.2 01/02/2018 0153    MICROBIOLOGY: Recent Results (from the past 240 hour(s))  Urine culture     Status: None   Collection Time: 01/01/18  5:05 PM  Result Value Ref Range Status   Specimen Description URINE, RANDOM  Final   Special Requests NONE  Final   Culture   Final    NO GROWTH Performed at [facility], [name] [address], [place], [place] [address]    Report Status 01/02/2018 FINAL  Final  Blood Cultures (routine x 2)     Status: None (Preliminary result)   Collection Time: 01/01/18  5:26 PM  Result Value Ref Range Status   Specimen Description BLOOD LEFT HAND  Final    Special Requests   Final    BOTTLES DRAWN AEROBIC AND ANAEROBIC Blood Culture adequate volume   Culture   Final    NO GROWTH 2 DAYS Performed at [facility], [place] [address] [place], [place] [address]    Report Status PENDING  Incomplete  Blood Cultures (routine x 2)     Status: None (Preliminary result)   Collection Time: 01/01/18  5:26 PM  Result Value Ref Range Status   Specimen Description BLOOD RIGHT FOREARM  Final   Special Requests   Final    BOTTLES DRAWN AEROBIC AND ANAEROBIC Blood Culture adequate volume   Culture   Final    NO GROWTH 2 DAYS Performed at [facility], [place] [address] [place], [place] [address]    Report Status PENDING  Incomplete  Culture, sputum-assessment     Status: None   Collection Time: 01/03/18  6:52 PM  Result Value Ref Range Status   Specimen Description EXPECTORATED SPUTUM  Final   Special Requests Normal  Final   Sputum evaluation   Final    THIS SPECIMEN IS ACCEPTABLE FOR SPUTUM CULTURE Performed at [facility], [address], [place], [place] [address]    Report Status 01/03/2018 FINAL  Final  Culture, respiratory (NON-Expectorated)     Status: None (Preliminary result)   Collection Time: 01/03/18  6:52 PM  Result Value Ref Range Status   Specimen Description EXPECTORATED SPUTUM  Final   Special Requests Normal Reflexed from [id number]  Final   Gram Stain   Final    ABUNDANT WBC PRESENT,BOTH PMN AND MONONUCLEAR RARE GRAM POSITIVE COCCI RARE GRAM POSITIVE RODS Performed at [facility], [place] [address], [place], [place] [address]    Culture PENDING  Incomplete   Report Status PENDING  Incomplete    RADIOLOGY STUDIES/RESULTS: Dg Chest 2 View  Result Date: 01/01/2018 CLINICAL DATA:  Hypoxia EXAM: CHEST - 2 VIEW COMPARISON:  12/19/2017 FINDINGS: Cardiac shadow is stable. Defibrillator is again noted. Lungs are hyper aerated bilaterally. No focal infiltrate or  sizable effusion is seen. No acute bony abnormality  is noted. IMPRESSION: COPD without acute abnormality. Electronically Signed   By: Inez Catalina M.D.   On: 01/01/2018 16:23   Dg Chest 2 View  Result Date: 12/19/2017 CLINICAL DATA:  Generalized weakness and fever. EXAM: CHEST - 2 VIEW COMPARISON:  10/14/2017. FINDINGS: Unchanged cardiomediastinal silhouette. Dual lead pacer is stable. Upper lobe RIGHT greater than LEFT pulmonary opacities do not appear to have progressed. No consolidation or edema. No effusion or pneumothorax. IMPRESSION: Stable chest. Electronically Signed   By: Staci Righter M.D.   On: 12/19/2017 15:36   Ct Head Wo Contrast  Result Date: 01/01/2018 CLINICAL DATA:  Worsening mental status with decline. EXAM: CT HEAD WITHOUT CONTRAST TECHNIQUE: Contiguous axial images were obtained from the base of the skull through the vertex without intravenous contrast. COMPARISON:  02/15/2017 FINDINGS: Brain: Generalized age related atrophy. No evidence of old or acute focal infarction, mass lesion, hemorrhage, hydrocephalus or extra-axial collection. Vascular: There is atherosclerotic calcification of the major vessels at the base of the brain. Skull: Normal Sinuses/Orbits: Clear/normal Other: None IMPRESSION: Age related atrophy. No focal or acute finding. No progressive change visible since last year. Electronically Signed   By: Nelson Chimes M.D.   On: 01/01/2018 16:41   Ct Chest Wo Contrast  Result Date: 01/01/2018 CLINICAL DATA:  Acute onset of cough. Respiratory illness. EXAM: CT CHEST WITHOUT CONTRAST TECHNIQUE: Multidetector CT imaging of the chest was performed following the standard protocol without IV contrast. COMPARISON:  Chest radiograph performed earlier today at 4:26 p.m., and CT of the chest performed 10/04/2017 FINDINGS: Cardiovascular: The heart is normal in size. Diffuse coronary artery calcifications are seen. Scattered calcification is noted along the thoracic aorta and proximal great vessels. The patient's pacemaker/AICD is noted  at the left chest wall, with leads ending at the right atrium and right ventricle. Mediastinum/Nodes: The esophagus is diffusely distended with solid material, measuring up to 6 cm in diameter proximally, concerning for achalasia. A 1.2 cm azygoesophageal recess node is noted. Remaining visualized mediastinal nodes remain normal in size. No pericardial effusion is identified. The thyroid gland is grossly unremarkable. No axillary lymphadenopathy is seen. Lungs/Pleura: New patchy right-sided airspace opacification is compatible with multifocal pneumonia. Atypical infection is a concern. A number of the opacities are peribronchovascular in nature. Mild peripheral scarring is noted bilaterally. No pleural effusion or pneumothorax is identified. Upper Abdomen: The visualized portions of the liver and spleen are unremarkable. The patient is status post cholecystectomy, with clips noted at the gallbladder fossa. The pancreas is somewhat atrophic in appearance. The visualized portions of the adrenal glands are unremarkable. Musculoskeletal: No acute osseous abnormalities are identified. The visualized musculature is unremarkable in appearance. IMPRESSION: 1. New patchy right-sided airspace opacification is compatible with multifocal pneumonia. Atypical infection is a concern. A number of the opacities are peribronchovascular in nature. 2. Diffuse distention of the esophagus with solid material, measuring up to 6 cm in diameter proximally, concerning for achalasia. 3. 1.2 cm azygoesophageal recess node noted. This may reflect the underlying infection. 4. Diffuse coronary artery calcifications seen. Aortic Atherosclerosis (ICD10-I70.0). Electronically Signed   By: Garald Balding M.D.   On: 01/01/2018 22:00   Dg Esophagus Dilation  Result Date: 01/03/2018 ESOPHAGEAL DILATATION: Fluoroscopy was provided for use by the requesting physician.  No images were obtained for radiographic interpretation.    LOS: 2 days    Oren Binet, MD  [facility]  If 7PM-7AM, please contact night-coverage  Please page via [email or web address] [email or web address]-click on MD name and type text message  01/04/2018, 10:06 AM

## 2018-01-04 NOTE — Progress Notes (Addendum)
Daily Rounding Note  01/04/2018, 11:17 AM  LOS: 2 days   SUBJECTIVE:   Chief complaint: hard for pt to tell if swallowing is better as only had clears so far.  BM yesterday.   OBJECTIVE:         Vital signs in last 24 hours:    Temp:  [97.6 F (36.4 C)-98.3 F (36.8 C)] 98 F (36.7 C) (06/06 7106) Pulse Rate:  [60-80] 60 (06/06 0633) Resp:  [16-22] 16 (06/06 2694) BP: (103-120)/(43-52) 106/52 (06/06 0633) SpO2:  [96 %-100 %] 100 % (06/06 0810) Weight:  [156 lb (70.8 kg)] 156 lb (70.8 kg) (06/05 1308) Last BM Date: 01/03/18 Filed Weights   01/01/18 1542 01/03/18 1308  Weight: 156 lb (70.8 kg) 156 lb (70.8 kg)   General: looks a bit less frail.  Alert.  comfortable   Heart: RRR Chest: clear bil.  No labored breathing.  Periodic throat clearing and cough Abdomen: soft, NT,  ND.  Active BS  Extremities: no CCE Neuro/Psych:  Alert, appropriate.    Intake/Output from previous day: 06/05 0701 - 06/06 0700 In: 8546 [P.O.:600; I.V.:740; IV Piggyback:150] Out: 720 [Urine:720]  Intake/Output this shift: Total I/O In: 660 [P.O.:660] Out: 300 [Urine:300]  Lab Results: Recent Labs    01/02/18 0153 01/03/18 0643 01/04/18 0647  WBC 16.0* 8.2 7.0  HGB 10.4* 11.1* 10.6*  HCT 34.7* 37.1* 35.2*  PLT 225 216 201   BMET Recent Labs    01/02/18 0153 01/03/18 0643 01/04/18 0647  NA 143 140 145  K 3.8 3.8 3.9  CL 109 110 109  CO2 28 26 28   GLUCOSE 159* 109* 87  BUN 36* 22* 14  CREATININE 1.95* 1.38* 1.30*  CALCIUM 8.4* 8.1* 8.3*   LFT Recent Labs    01/01/18 1544 01/02/18 0153  PROT 6.7 5.4*  ALBUMIN 3.0* 2.4*  AST 27 21  ALT 15* 15*  ALKPHOS 38 33*  BILITOT 0.7 0.7   PT/INR Recent Labs    01/01/18 2231  LABPROT 15.4*  INR 1.23   Hepatitis Panel No results for input(s): HEPBSAG, HCVAB, HEPAIGM, HEPBIGM in the last 72 hours.  Studies/Results: Dg Esophagus Dilation  Result Date:  01/03/2018 ESOPHAGEAL DILATATION: Fluoroscopy was provided for use by the requesting physician.  No images were obtained for radiographic interpretation.  Scheduled Meds: . aspirin  81 mg Oral Daily  . donepezil  10 mg Oral q morning - 10a  . enoxaparin (LOVENOX) injection  40 mg Subcutaneous Q24H  . fenofibrate  160 mg Oral Daily  . furosemide  40 mg Oral Daily  . guaiFENesin  600 mg Oral BID  . insulin aspart  0-5 Units Subcutaneous QHS  . insulin aspart  0-9 Units Subcutaneous TID WC  . loratadine  10 mg Oral Daily  . memantine  10 mg Oral BID  . mometasone-formoterol  2 puff Inhalation BID  . pantoprazole  40 mg Oral Daily  . pravastatin  40 mg Oral QHS   Continuous Infusions: . ampicillin-sulbactam (UNASYN) IV Stopped (01/04/18 0522)  . dextrose 5 % and 0.45% NaCl 10 mL/hr at 01/04/18 0826   PRN Meds:.acetaminophen **OR** acetaminophen, albuterol, ondansetron **OR** ondansetron (ZOFRAN) IV   ASSESMENT:   *    Achalasia.  Objective findings c/w severe disease with food stacking in dilated esophagus per esophagram in April and CT chest yesterday.  Subjectively pt dose not register signif sxs.   01/03/18 egd with balloon dilatation of  distal esophagus.  Fluid seen in severely dilated esophagus  *    Asp PNA.   On Unasyn.    PLAN   *  Mech soft diet.  Daily po Protonix.    *  Will arrange fup with Dr Silverio Decamp.   She will see him back in 2 to 3 months.       Azucena Freed  01/04/2018, 11:17 AM Phone [phone]

## 2018-01-05 LAB — BASIC METABOLIC PANEL
ANION GAP: 9 (ref 5–15)
BUN: 12 mg/dL (ref 6–20)
CO2: 28 mmol/L (ref 22–32)
Calcium: 8.6 mg/dL — ABNORMAL LOW (ref 8.9–10.3)
Chloride: 106 mmol/L (ref 101–111)
Creatinine, Ser: 1.35 mg/dL — ABNORMAL HIGH (ref 0.61–1.24)
GFR calc Af Amer: 55 mL/min — ABNORMAL LOW (ref >60)
GFR, EST NON AFRICAN AMERICAN: 47 mL/min — AB (ref >60)
Glucose, Bld: 113 mg/dL — ABNORMAL HIGH (ref 65–99)
POTASSIUM: 3.7 mmol/L (ref 3.5–5.1)
SODIUM: 143 mmol/L (ref 135–145)

## 2018-01-05 LAB — ACID FAST SMEAR (AFB): ACID FAST SMEAR - AFSCU2: NEGATIVE

## 2018-01-05 LAB — ACID FAST SMEAR (AFB, MYCOBACTERIA)

## 2018-01-05 LAB — GLUCOSE, CAPILLARY
GLUCOSE-CAPILLARY: 98 mg/dL (ref 65–99)
Glucose-Capillary: 166 mg/dL — ABNORMAL HIGH (ref 65–99)

## 2018-01-05 MED ORDER — FUROSEMIDE 20 MG PO TABS: 20.0000 mg | ORAL_TABLET | Freq: Every day | ORAL | 0 refills | Status: DC

## 2018-01-05 MED ORDER — AMOXICILLIN-POT CLAVULANATE 500-125 MG PO TABS: 1.0000 | ORAL_TABLET | Freq: Three times a day (TID) | ORAL | 0 refills | Status: DC

## 2018-01-05 NOTE — Progress Notes (Signed)
Patient given discharge instructions.  Patient verbalized understanding of discharge instructions.  PIV removed and area CDI.    Discharge vitals  Vitals:   01/05/18 0448 01/05/18 0757  BP: 120/65   Pulse: 68   Resp: 19   Temp: 97.9 F (36.6 C)   SpO2: 95% 96%    Discharge meds  Allergies as of 01/05/2018      Reactions   Codeine Other (See Comments)   Gets very angry, disoriented   Dilaudid [hydromorphone Hcl] Other (See Comments)   VERY AGITATED, HOSTILE   Flomax [tamsulosin Hcl] Shortness Of Breath   Morphine And Related Other (See Comments)   VERY AGITATED, HOSTILE   Sulfa Antibiotics Shortness Of Breath   Beta Adrenergic Blockers Other (See Comments)   Disorientation   Carvedilol Other (See Comments)   DISORIENTATION      Medication List    TAKE these medications   albuterol 108 (90 Base) MCG/ACT inhaler Commonly known as:  PROVENTIL HFA;VENTOLIN HFA Inhale 2 puffs into the lungs every 6 (six) hours as needed for wheezing or shortness of breath.   amoxicillin-clavulanate 500-125 MG tablet Commonly known as:  AUGMENTIN Take 1 tablet (500 mg total) by mouth 3 (three) times daily.   aspirin 81 MG EC tablet Take 81 mg by mouth daily.   BASAGLAR KWIKPEN 100 UNIT/ML Sopn INJECT 5 UNITS UNDER THE SKIN EVERY MORNING What changed:    how much to take  how to take this  when to take this  additional instructions   donepezil 10 MG tablet Commonly known as:  ARICEPT Take 1 tablet (10 mg total) by mouth every morning.   fenofibrate 145 MG tablet Commonly known as:  TRICOR TAKE 1 TABLET BY MOUTH EVERY DAY What changed:    how much to take  how to take this  when to take this   finasteride 5 MG tablet Commonly known as:  PROSCAR Take 5 mg by mouth daily.   furosemide 20 MG tablet Commonly known as:  LASIX Take 1 tablet (20 mg total) by mouth daily. Take 2 tablets (40 mg daily) for 2 days, and then resume 1 tablet (20 mg) daily. What changed:   additional instructions   insulin aspart 100 UNIT/ML injection Commonly known as:  novoLOG Inject 4-10 Units into the skin 3 (three) times daily before meals.   loratadine 10 MG tablet Commonly known as:  CLARITIN Take 1 tablet (10 mg total) by mouth daily.   memantine 10 MG tablet Commonly known as:  NAMENDA Take 1 tablet (10 mg total) by mouth 2 (two) times daily.   nitroGLYCERIN 0.2 mg/hr patch Commonly known as:  NITRODUR - Dosed in mg/24 hr Place 1 patch (0.2 mg total) onto the skin daily. Alternates patch placement with odd/even days. What changed:  additional instructions   pantoprazole 40 MG tablet Commonly known as:  PROTONIX Take 1 tablet (40 mg total) by mouth 2 (two) times daily. What changed:  when to take this   pravastatin 40 MG tablet Commonly known as:  PRAVACHOL Take 1 tablet (40 mg total) by mouth at bedtime.   PRESERVISION AREDS 2 Caps Take 1 capsule by mouth 2 (two) times daily.   SPIRIVA HANDIHALER 18 MCG inhalation capsule Generic drug:  tiotropium INHALE CONTENTS OF 1 CAPSULE VIA HANDIHALER ONCE DAILY   SYMBICORT 160-4.5 MCG/ACT inhaler Generic drug:  budesonide-formoterol INHALE 2 PUFFS BY MOUTH TWICE DAILY What changed:    how much to take  how to take  this  when to take this   Vitamin D 2000 units tablet Take 2,000 Units by mouth daily.      Patient is getting dressed and waiting for son to arrive to take him home.

## 2018-01-05 NOTE — Care Management Note (Signed)
Case Management Note  Patient Details  Name: Antonio Ray MRN: [id number] Date of Birth: [birth year]  Subjective/Objective:                 Spoke to patient at the bedside. He states he is from home alone. He has a daughter that lives 5 houses down the road and a son who is also very helpful for him. He states he usually walks outside daily and does not feel he needs any DME or HH as his strength will improve as he is able to be more ambulatory at home. He states that his bedroom and bathroom are on the first floor. No CM needs at this time.    Action/Plan:  DC to home self care, declined HH and DME needs.   Expected Discharge Date:  01/05/18               Expected Discharge Plan:  Home/Self Care  In-House Referral:     Discharge planning Services  CM Consult  Post Acute Care Choice:    Choice offered to:     DME Arranged:    DME Agency:     HH Arranged:  Patient Refused [facility] Agency:     Status of Service:  Completed, signed off  If discussed at [employer] of Stay Meetings, dates discussed:    Additional Comments:  Carles Collet, RN 01/05/2018, 10:48 AM

## 2018-01-05 NOTE — Anesthesia Postprocedure Evaluation (Signed)
Anesthesia Post Note  Patient: Antonio Ray  Procedure(s) Performed: ESOPHAGOGASTRODUODENOSCOPY (EGD) (N/A ) RIGIFLEX DILATION (N/A )     Patient location during evaluation: PACU Anesthesia Type: General Level of consciousness: sedated and patient cooperative Pain management: pain level controlled Vital Signs Assessment: post-procedure vital signs reviewed and stable Respiratory status: spontaneous breathing Cardiovascular status: stable Anesthetic complications: no    Last Vitals:  Vitals:   01/05/18 0448 01/05/18 0757  BP: 120/65   Pulse: 68   Resp: 19   Temp: 36.6 C   SpO2: 95% 96%    Last Pain:  Vitals:   01/05/18 0448  TempSrc: Oral  PainSc:                  Nolon Nations

## 2018-01-05 NOTE — Care Management Important Message (Signed)
Important Message  Patient Details  Name: Antonio Ray MRN: [id number] Date of Birth: Aug 21, 1933   Medicare Important Message Given:  Yes    Aaryanna Hyden 01/05/2018, 4:11 PM

## 2018-01-05 NOTE — Discharge Summary (Signed)
[name] Age: 84 y.o. Sex: male Date of Birth: [birth year] MRN: [id number]. Admitting Physician: Toy Baker, MD TIW:[id number], Jannifer Rodney, MD  Admit Date: 01/01/2018 Discharge date: 01/05/2018  Recommendations for Outpatient Follow-up:  1. Follow up with PCP in 1-2 weeks 2. Please obtain BMP/CBC in one week 3. Please ensure follow-up with gastroenterology and cardiology.    Admitted From:  Home   Disposition: Home with home health services   [place]: No  Equipment/Devices: None  Discharge Condition: Stable  CODE STATUS: FULL CODE  Diet recommendation:  Heart Healthy / Carb Modified but Dysphagia 3 diet  Brief Summary: See H&P, Labs, Consult and Test reports for all details in brief,Patient is a 84 y.o. male with history of ischemic cardiomyopathy (EF 25%), severe achalasia, recurrent hospitalizations for aspiration pneumonia-presented with aspiration pneumonitis.  See below for further details  Brief Hospital Course: Aspiration pneumonia: Improving-afebrile, not short of breath-no leukocytosis-managed initially with Zosyn then narrowed to Unasyn-we will transition to Augmentin for 2 more days to complete a 7-day course.  Given underlying severe achalasia-high suspicion for aspiration pneumonia.   Achalasia: Due to above-underwent GI evaluation-and endoscopy on 6/5-underwent dilatation.  Subsequently started on a dysphagia 3 diet as recommended by gastroenterology.  Continue PPI on discharge.  Ensure follow-up with gastroenterology in the outpatient setting.   Chronic kidney disease stage III: Creatinine close to usual baseline.  Follow  Chronic systolic heart failure (EF 20-25% by TTE on 10/15/2017):  Mild leg edema-we will asked patient to resume Lasix at 40 mg daily for 2 days, before resuming his usual regimen of 20 mg daily.  Apart from very minimal leg edema-he does not have any features consistent with decompensated heart failure.   His lungs are completely clear on exam.  COPD: Stable-no exacerbation-continue with bronchodilators.    Dyslipidemia: Taking statin and fenofibrate.    DM-2:CBG stable with SSI inpatient-resume Basaglar insulin on discharge  Mild dementia/cognitive dysfunction: Awake and mostly alert during my exam this morning-continue Aricept and Namenda  Family communication: Spoke with son over the phone on 6/7  Procedures/Studies: 6/5>> EGD  Discharge Diagnoses:  Active Problems:   Coronary artery disease   Acute on chronic combined systolic and diastolic congestive heart failure, NYHA class 3 ([name])   S/P ICD (internal cardiac defibrillator) procedure   COPD (chronic obstructive pulmonary disease) ([name])   BPH (benign prostatic hyperplasia)   Acute encephalopathy   COPD GOLD IV   Dysphagia   Esophageal dysmotilities   Achalasia   Acute respiratory failure with hypoxia ([name])   CAD S/P percutaneous coronary angioplasty   Chronic kidney disease, stage III (moderate) ([name])   Type 2 diabetes mellitus with stage 3 chronic kidney disease, with long-term current use of insulin ([name])   Chronic systolic congestive heart failure ([name])   Chronic systolic CHF (congestive heart failure) ([name])   Chronic combined systolic and diastolic congestive heart failure ([name])   Esophageal dysmotility   SIRS (systemic inflammatory response syndrome) ([name])   Aspiration pneumonia of both lower lobes due to gastric secretions [facility])   Discharge Instructions:  Activity:  As tolerated with Full fall precautions use walker/cane & assistance as needed   Discharge Instructions    (HEART FAILURE PATIENTS) Call MD:  Anytime you have any of the following symptoms: 1) 3 pound weight gain in 24 hours or 5 pounds in 1 week 2) shortness of breath, with or without a dry hacking cough 3) swelling in the hands, feet or  stomach 4) if you have to sleep on extra pillows at night in order to breathe.   Complete by:  As  directed    Diet - low sodium heart healthy   Complete by:  As directed    Dysphagia 3/mechanical soft diet   Discharge instructions   Complete by:  As directed    Follow with Primary MD  [name], MD  In 1 week  Follow with Dr Silverio Decamp in 2 months   Please get a complete blood count and chemistry panel checked by your Primary MD at your next visit, and again as instructed by your Primary MD.  Get Medicines reviewed and adjusted: Please take all your medications with you for your next visit with your Primary MD  Laboratory/radiological data: Please request your Primary MD to go over all hospital tests and procedure/radiological results at the follow up, please ask your Primary MD to get all Hospital records sent to his/her office.  In some cases, they will be blood work, cultures and biopsy results pending at the time of your discharge. Please request that your primary care M.D. follows up on these results.  Also Note the following: If you experience worsening of your admission symptoms, develop shortness of breath, life threatening emergency, suicidal or homicidal thoughts you must seek medical attention immediately by calling 911 or calling your MD immediately  if symptoms less severe.  You must read complete instructions/literature along with all the possible adverse reactions/side effects for all the Medicines you take and that have been prescribed to you. Take any new Medicines after you have completely understood and accpet all the possible adverse reactions/side effects.   Do not drive when taking Pain medications or sleeping medications (Benzodaizepines)  Do not take more than prescribed Pain, Sleep and Anxiety Medications. It is not advisable to combine anxiety,sleep and pain medications without talking with your primary care practitioner  Special Instructions: If you have smoked or chewed Tobacco  in the last 2 yrs please stop smoking, stop any regular Alcohol  and or  any Recreational drug use.  Wear Seat belts while driving.  Please note: You were cared for by a hospitalist during your hospital stay. Once you are discharged, your primary care physician will handle any further medical issues. Please note that NO REFILLS for any discharge medications will be authorized once you are discharged, as it is imperative that you return to your primary care physician (or establish a relationship with a primary care physician if you do not have one) for your post hospital discharge needs so that they can reassess your need for medications and monitor your lab values.   Increase activity slowly   Complete by:  As directed      Allergies as of 01/05/2018      Reactions   Codeine Other (See Comments)   Gets very angry, disoriented   Dilaudid [hydromorphone Hcl] Other (See Comments)   VERY AGITATED, HOSTILE   Flomax [tamsulosin Hcl] Shortness Of Breath   Morphine And Related Other (See Comments)   VERY AGITATED, HOSTILE   Sulfa Antibiotics Shortness Of Breath   Beta Adrenergic Blockers Other (See Comments)   Disorientation   Carvedilol Other (See Comments)   DISORIENTATION      Medication List    TAKE these medications   albuterol 108 (90 Base) MCG/ACT inhaler Commonly known as:  PROVENTIL HFA;VENTOLIN HFA Inhale 2 puffs into the lungs every 6 (six) hours as needed for wheezing or shortness of  breath.   amoxicillin-clavulanate 500-125 MG tablet Commonly known as:  AUGMENTIN Take 1 tablet (500 mg total) by mouth 3 (three) times daily.   aspirin 81 MG EC tablet Take 81 mg by mouth daily.   BASAGLAR KWIKPEN 100 UNIT/ML Sopn INJECT 5 UNITS UNDER THE SKIN EVERY MORNING What changed:    how much to take  how to take this  when to take this  additional instructions   donepezil 10 MG tablet Commonly known as:  ARICEPT Take 1 tablet (10 mg total) by mouth every morning.   fenofibrate 145 MG tablet Commonly known as:  TRICOR TAKE 1 TABLET BY MOUTH  EVERY DAY What changed:    how much to take  how to take this  when to take this   finasteride 5 MG tablet Commonly known as:  PROSCAR Take 5 mg by mouth daily.   furosemide 20 MG tablet Commonly known as:  LASIX Take 1 tablet (20 mg total) by mouth daily. Take 2 tablets (40 mg daily) for 2 days, and then resume 1 tablet (20 mg) daily. What changed:  additional instructions   insulin aspart 100 UNIT/ML injection Commonly known as:  novoLOG Inject 4-10 Units into the skin 3 (three) times daily before meals.   loratadine 10 MG tablet Commonly known as:  CLARITIN Take 1 tablet (10 mg total) by mouth daily.   memantine 10 MG tablet Commonly known as:  NAMENDA Take 1 tablet (10 mg total) by mouth 2 (two) times daily.   nitroGLYCERIN 0.2 mg/hr patch Commonly known as:  NITRODUR - Dosed in mg/24 hr Place 1 patch (0.2 mg total) onto the skin daily. Alternates patch placement with odd/even days. What changed:  additional instructions   pantoprazole 40 MG tablet Commonly known as:  PROTONIX Take 1 tablet (40 mg total) by mouth 2 (two) times daily. What changed:  when to take this   pravastatin 40 MG tablet Commonly known as:  PRAVACHOL Take 1 tablet (40 mg total) by mouth at bedtime.   PRESERVISION AREDS 2 Caps Take 1 capsule by mouth 2 (two) times daily.   SPIRIVA HANDIHALER 18 MCG inhalation capsule Generic drug:  tiotropium INHALE CONTENTS OF 1 CAPSULE VIA HANDIHALER ONCE DAILY   SYMBICORT 160-4.5 MCG/ACT inhaler Generic drug:  budesonide-formoterol INHALE 2 PUFFS BY MOUTH TWICE DAILY What changed:    how much to take  how to take this  when to take this   Vitamin D 2000 units tablet Take 2,000 Units by mouth daily.      Follow-up Information    [name], MD. Schedule an appointment as soon as possible for a visit in 1 week(s).   Specialty:  Family Medicine Contact information: [place] [address] [phone]         Martinique, Peter M, MD. Schedule an appointment as soon as possible for a visit in 2 week(s).   Specialty:  Cardiology Contact information: [address] [place] [address] [phone]        Mauri Pole, MD. Schedule an appointment as soon as possible for a visit in 2 month(s).   Specialty:  Gastroenterology Contact information: [place] [address] [phone]          Allergies  Allergen Reactions  . Codeine Other (See Comments)    Gets very angry, disoriented  . Dilaudid [Hydromorphone Hcl] Other (See Comments)    VERY AGITATED, HOSTILE  . Flomax [Tamsulosin Hcl]  Shortness Of Breath  . Morphine And Related Other (See Comments)    VERY AGITATED, HOSTILE  . Sulfa Antibiotics Shortness Of Breath  . Beta Adrenergic Blockers Other (See Comments)    Disorientation  . Carvedilol Other (See Comments)    DISORIENTATION    Consultations:   GI  Other Procedures/Studies: Dg Chest 2 View  Result Date: 01/01/2018 CLINICAL DATA:  Hypoxia EXAM: CHEST - 2 VIEW COMPARISON:  12/19/2017 FINDINGS: Cardiac shadow is stable. Defibrillator is again noted. Lungs are hyper aerated bilaterally. No focal infiltrate or sizable effusion is seen. No acute bony abnormality is noted. IMPRESSION: COPD without acute abnormality. Electronically Signed   By: Inez Catalina M.D.   On: 01/01/2018 16:23   Dg Chest 2 View  Result Date: 12/19/2017 CLINICAL DATA:  Generalized weakness and fever. EXAM: CHEST - 2 VIEW COMPARISON:  10/14/2017. FINDINGS: Unchanged cardiomediastinal silhouette. Dual lead pacer is stable. Upper lobe RIGHT greater than LEFT pulmonary opacities do not appear to have progressed. No consolidation or edema. No effusion or pneumothorax. IMPRESSION: Stable chest. Electronically Signed   By: Staci Righter M.D.   On: 12/19/2017 15:36   Ct Head Wo Contrast  Result Date: 01/01/2018 CLINICAL DATA:  Worsening mental status with decline. EXAM: CT HEAD  WITHOUT CONTRAST TECHNIQUE: Contiguous axial images were obtained from the base of the skull through the vertex without intravenous contrast. COMPARISON:  02/15/2017 FINDINGS: Brain: Generalized age related atrophy. No evidence of old or acute focal infarction, mass lesion, hemorrhage, hydrocephalus or extra-axial collection. Vascular: There is atherosclerotic calcification of the major vessels at the base of the brain. Skull: Normal Sinuses/Orbits: Clear/normal Other: None IMPRESSION: Age related atrophy. No focal or acute finding. No progressive change visible since last year. Electronically Signed   By: Nelson Chimes M.D.   On: 01/01/2018 16:41   Ct Chest Wo Contrast  Result Date: 01/01/2018 CLINICAL DATA:  Acute onset of cough. Respiratory illness. EXAM: CT CHEST WITHOUT CONTRAST TECHNIQUE: Multidetector CT imaging of the chest was performed following the standard protocol without IV contrast. COMPARISON:  Chest radiograph performed earlier today at 4:26 p.m., and CT of the chest performed 10/04/2017 FINDINGS: Cardiovascular: The heart is normal in size. Diffuse coronary artery calcifications are seen. Scattered calcification is noted along the thoracic aorta and proximal great vessels. The patient's pacemaker/AICD is noted at the left chest wall, with leads ending at the right atrium and right ventricle. Mediastinum/Nodes: The esophagus is diffusely distended with solid material, measuring up to 6 cm in diameter proximally, concerning for achalasia. A 1.2 cm azygoesophageal recess node is noted. Remaining visualized mediastinal nodes remain normal in size. No pericardial effusion is identified. The thyroid gland is grossly unremarkable. No axillary lymphadenopathy is seen. Lungs/Pleura: New patchy right-sided airspace opacification is compatible with multifocal pneumonia. Atypical infection is a concern. A number of the opacities are peribronchovascular in nature. Mild peripheral scarring is noted  bilaterally. No pleural effusion or pneumothorax is identified. Upper Abdomen: The visualized portions of the liver and spleen are unremarkable. The patient is status post cholecystectomy, with clips noted at the gallbladder fossa. The pancreas is somewhat atrophic in appearance. The visualized portions of the adrenal glands are unremarkable. Musculoskeletal: No acute osseous abnormalities are identified. The visualized musculature is unremarkable in appearance. IMPRESSION: 1. New patchy right-sided airspace opacification is compatible with multifocal pneumonia. Atypical infection is a concern. A number of the opacities are peribronchovascular in nature. 2. Diffuse distention of the esophagus with solid material, measuring up to  6 cm in diameter proximally, concerning for achalasia. 3. 1.2 cm azygoesophageal recess node noted. This may reflect the underlying infection. 4. Diffuse coronary artery calcifications seen. Aortic Atherosclerosis (ICD10-I70.0). Electronically Signed   By: Garald Balding M.D.   On: 01/01/2018 22:00   Dg Esophagus Dilation  Result Date: 01/03/2018 ESOPHAGEAL DILATATION: Fluoroscopy was provided for use by the requesting physician.  No images were obtained for radiographic interpretation.     TODAY-DAY OF DISCHARGE:  Subjective:   Antonio Ray today has no headache,no chest abdominal pain,no new weakness tingling or numbness, feels much better wants to go home today.  Objective:   Blood pressure 120/65, pulse 68, temperature 97.9 F (36.6 C), temperature source Oral, resp. rate 19, height 5\' 8"  (1.727 m), weight 70.8 kg (156 lb), SpO2 96 %.  Intake/Output Summary (Last 24 hours) at 01/05/2018 0959 Last data filed at 01/05/2018 0451 Gross per 24 hour  Intake 1662 ml  Output 1550 ml  Net 112 ml   Filed Weights   01/01/18 1542 01/03/18 1308  Weight: 70.8 kg (156 lb) 70.8 kg (156 lb)    Exam: Awake Alert, Oriented *3, No new F.N deficits, Normal  affect [place].AT,PERRAL Supple Neck,No JVD, No cervical lymphadenopathy appriciated.  Symmetrical Chest wall movement, Good air movement bilaterally, CTAB RRR,No Gallops,Rubs or new Murmurs, No Parasternal Heave +ve B.Sounds, Abd Soft, Non tender, No organomegaly appriciated, No rebound -guarding or rigidity. No Cyanosis, Clubbing or edema, No new Rash or bruise   PERTINENT RADIOLOGIC STUDIES: Dg Chest 2 View  Result Date: 01/01/2018 CLINICAL DATA:  Hypoxia EXAM: CHEST - 2 VIEW COMPARISON:  12/19/2017 FINDINGS: Cardiac shadow is stable. Defibrillator is again noted. Lungs are hyper aerated bilaterally. No focal infiltrate or sizable effusion is seen. No acute bony abnormality is noted. IMPRESSION: COPD without acute abnormality. Electronically Signed   By: Inez Catalina M.D.   On: 01/01/2018 16:23   Dg Chest 2 View  Result Date: 12/19/2017 CLINICAL DATA:  Generalized weakness and fever. EXAM: CHEST - 2 VIEW COMPARISON:  10/14/2017. FINDINGS: Unchanged cardiomediastinal silhouette. Dual lead pacer is stable. Upper lobe RIGHT greater than LEFT pulmonary opacities do not appear to have progressed. No consolidation or edema. No effusion or pneumothorax. IMPRESSION: Stable chest. Electronically Signed   By: Staci Righter M.D.   On: 12/19/2017 15:36   Ct Head Wo Contrast  Result Date: 01/01/2018 CLINICAL DATA:  Worsening mental status with decline. EXAM: CT HEAD WITHOUT CONTRAST TECHNIQUE: Contiguous axial images were obtained from the base of the skull through the vertex without intravenous contrast. COMPARISON:  02/15/2017 FINDINGS: Brain: Generalized age related atrophy. No evidence of old or acute focal infarction, mass lesion, hemorrhage, hydrocephalus or extra-axial collection. Vascular: There is atherosclerotic calcification of the major vessels at the base of the brain. Skull: Normal Sinuses/Orbits: Clear/normal Other: None IMPRESSION: Age related atrophy. No focal or acute finding. No progressive  change visible since last year. Electronically Signed   By: Nelson Chimes M.D.   On: 01/01/2018 16:41   Ct Chest Wo Contrast  Result Date: 01/01/2018 CLINICAL DATA:  Acute onset of cough. Respiratory illness. EXAM: CT CHEST WITHOUT CONTRAST TECHNIQUE: Multidetector CT imaging of the chest was performed following the standard protocol without IV contrast. COMPARISON:  Chest radiograph performed earlier today at 4:26 p.m., and CT of the chest performed 10/04/2017 FINDINGS: Cardiovascular: The heart is normal in size. Diffuse coronary artery calcifications are seen. Scattered calcification is noted along the thoracic aorta and proximal great vessels. The  patient's pacemaker/AICD is noted at the left chest wall, with leads ending at the right atrium and right ventricle. Mediastinum/Nodes: The esophagus is diffusely distended with solid material, measuring up to 6 cm in diameter proximally, concerning for achalasia. A 1.2 cm azygoesophageal recess node is noted. Remaining visualized mediastinal nodes remain normal in size. No pericardial effusion is identified. The thyroid gland is grossly unremarkable. No axillary lymphadenopathy is seen. Lungs/Pleura: New patchy right-sided airspace opacification is compatible with multifocal pneumonia. Atypical infection is a concern. A number of the opacities are peribronchovascular in nature. Mild peripheral scarring is noted bilaterally. No pleural effusion or pneumothorax is identified. Upper Abdomen: The visualized portions of the liver and spleen are unremarkable. The patient is status post cholecystectomy, with clips noted at the gallbladder fossa. The pancreas is somewhat atrophic in appearance. The visualized portions of the adrenal glands are unremarkable. Musculoskeletal: No acute osseous abnormalities are identified. The visualized musculature is unremarkable in appearance. IMPRESSION: 1. New patchy right-sided airspace opacification is compatible with multifocal  pneumonia. Atypical infection is a concern. A number of the opacities are peribronchovascular in nature. 2. Diffuse distention of the esophagus with solid material, measuring up to 6 cm in diameter proximally, concerning for achalasia. 3. 1.2 cm azygoesophageal recess node noted. This may reflect the underlying infection. 4. Diffuse coronary artery calcifications seen. Aortic Atherosclerosis (ICD10-I70.0). Electronically Signed   By: Garald Balding M.D.   On: 01/01/2018 22:00   Dg Esophagus Dilation  Result Date: 01/03/2018 ESOPHAGEAL DILATATION: Fluoroscopy was provided for use by the requesting physician.  No images were obtained for radiographic interpretation.    PERTINENT LAB RESULTS: CBC: Recent Labs    01/03/18 0643 01/04/18 0647  WBC 8.2 7.0  HGB 11.1* 10.6*  HCT 37.1* 35.2*  PLT 216 201   CMET CMP     Component Value Date/Time   NA 143 01/05/2018 0656   K 3.7 01/05/2018 0656   CL 106 01/05/2018 0656   CO2 28 01/05/2018 0656   GLUCOSE 113 (H) 01/05/2018 0656   BUN 12 01/05/2018 0656   CREATININE 1.35 (H) 01/05/2018 0656   CREATININE 1.48 (H) 04/24/2015 1227   CALCIUM 8.6 (L) 01/05/2018 0656   PROT 5.4 (L) 01/02/2018 0153   ALBUMIN 2.4 (L) 01/02/2018 0153   AST 21 01/02/2018 0153   ALT 15 (L) 01/02/2018 0153   ALKPHOS 33 (L) 01/02/2018 0153   BILITOT 0.7 01/02/2018 0153   GFRNONAA 47 (L) 01/05/2018 0656   GFRAA 55 (L) 01/05/2018 0656    GFR Estimated Creatinine Clearance: 40.8 mL/min (A) (by C-G formula based on SCr of 1.35 mg/dL (H)). No results for input(s): LIPASE, AMYLASE in the last 72 hours. No results for input(s): CKTOTAL, CKMB, CKMBINDEX, TROPONINI in the last 72 hours. Invalid input(s): POCBNP No results for input(s): DDIMER in the last 72 hours. No results for input(s): HGBA1C in the last 72 hours. No results for input(s): CHOL, HDL, LDLCALC, TRIG, CHOLHDL, LDLDIRECT in the last 72 hours. No results for input(s): TSH, T4TOTAL, T3FREE, THYROIDAB in  the last 72 hours.  Invalid input(s): FREET3 No results for input(s): VITAMINB12, FOLATE, FERRITIN, TIBC, IRON, RETICCTPCT in the last 72 hours. Coags: No results for input(s): INR in the last 72 hours.  Invalid input(s): PT Microbiology: Recent Results (from the past 240 hour(s))  Urine culture     Status: None   Collection Time: 01/01/18  5:05 PM  Result Value Ref Range Status   Specimen Description URINE, RANDOM  Final  Special Requests NONE  Final   Culture   Final    NO GROWTH Performed at [facility], [place] [address] [place], [place] [address]    Report Status 01/02/2018 FINAL  Final  Blood Cultures (routine x 2)     Status: None (Preliminary result)   Collection Time: 01/01/18  5:26 PM  Result Value Ref Range Status   Specimen Description BLOOD LEFT HAND  Final   Special Requests   Final    BOTTLES DRAWN AEROBIC AND ANAEROBIC Blood Culture adequate volume   Culture   Final    NO GROWTH 3 DAYS Performed at [facility], [place] [address], [place], [place] [address]    Report Status PENDING  Incomplete  Blood Cultures (routine x 2)     Status: None (Preliminary result)   Collection Time: 01/01/18  5:26 PM  Result Value Ref Range Status   Specimen Description BLOOD RIGHT FOREARM  Final   Special Requests   Final    BOTTLES DRAWN AEROBIC AND ANAEROBIC Blood Culture adequate volume   Culture   Final    NO GROWTH 3 DAYS Performed at [facility], [place] [address] [place], [place] [address]    Report Status PENDING  Incomplete  Culture, sputum-assessment     Status: None   Collection Time: 01/03/18  6:52 PM  Result Value Ref Range Status   Specimen Description EXPECTORATED SPUTUM  Final   Special Requests Normal  Final   Sputum evaluation   Final    THIS SPECIMEN IS ACCEPTABLE FOR SPUTUM CULTURE Performed at [facility], [address] [place], [place] [address]    Report Status 01/03/2018 FINAL  Final  Culture, respiratory  (NON-Expectorated)     Status: None (Preliminary result)   Collection Time: 01/03/18  6:52 PM  Result Value Ref Range Status   Specimen Description EXPECTORATED SPUTUM  Final   Special Requests Normal Reflexed from [id number]  Final   Gram Stain   Final    ABUNDANT WBC PRESENT,BOTH PMN AND MONONUCLEAR RARE GRAM POSITIVE COCCI RARE GRAM POSITIVE RODS    Culture   Final    CULTURE REINCUBATED FOR BETTER GROWTH Performed at [facility], [place] [address] [place], [place] [address]    Report Status PENDING  Incomplete    FURTHER DISCHARGE INSTRUCTIONS:  Get Medicines reviewed and adjusted: Please take all your medications with you for your next visit with your Primary MD  Laboratory/radiological data: Please request your Primary MD to go over all hospital tests and procedure/radiological results at the follow up, please ask your Primary MD to get all Hospital records sent to his/her office.  In some cases, they will be blood work, cultures and biopsy results pending at the time of your discharge. Please request that your primary care M.D. goes through all the records of your hospital data and follows up on these results.  Also Note the following: If you experience worsening of your admission symptoms, develop shortness of breath, life threatening emergency, suicidal or homicidal thoughts you must seek medical attention immediately by calling 911 or calling your MD immediately  if symptoms less severe.  You must read complete instructions/literature along with all the possible adverse reactions/side effects for all the Medicines you take and that have been prescribed to you. Take any new Medicines after you have completely understood and accpet all the possible adverse reactions/side effects.   Do not drive when taking Pain medications  or sleeping medications (Benzodaizepines)  Do not take more than prescribed Pain, Sleep and Anxiety Medications. It is not advisable to combine  anxiety,sleep and pain medications without talking with your primary care practitioner  Special Instructions: If you have smoked or chewed Tobacco  in the last 2 yrs please stop smoking, stop any regular Alcohol  and or any Recreational drug use.  Wear Seat belts while driving.  Please note: You were cared for by a hospitalist during your hospital stay. Once you are discharged, your primary care physician will handle any further medical issues. Please note that NO REFILLS for any discharge medications will be authorized once you are discharged, as it is imperative that you return to your primary care physician (or establish a relationship with a primary care physician if you do not have one) for your post hospital discharge needs so that they can reassess your need for medications and monitor your lab values.  Total Time spent coordinating discharge including counseling, education and face to face time equals 45 minutes.  SignedOren Binet 01/05/2018 9:59 AM

## 2018-01-06 LAB — CULTURE, BLOOD (ROUTINE X 2)
CULTURE: NO GROWTH
Culture: NO GROWTH
Special Requests: ADEQUATE
Special Requests: ADEQUATE

## 2018-01-06 LAB — CULTURE, RESPIRATORY: SPECIAL REQUESTS: NORMAL

## 2018-01-06 LAB — CULTURE, RESPIRATORY W GRAM STAIN: Culture: NORMAL

## 2018-01-16 ENCOUNTER — Ambulatory Visit (INDEPENDENT_AMBULATORY_CARE_PROVIDER_SITE_OTHER): Payer: Medicare Other | Admitting: Physician Assistant

## 2018-01-16 ENCOUNTER — Encounter: Payer: Self-pay | Admitting: Physician Assistant

## 2018-01-16 VITALS — BP 115/68 | HR 60 | Ht 68.0 in | Wt 163.6 lb

## 2018-01-16 DIAGNOSIS — N183 Chronic kidney disease, stage 3 unspecified: Secondary | ICD-10-CM

## 2018-01-16 DIAGNOSIS — I2510 Atherosclerotic heart disease of native coronary artery without angina pectoris: Secondary | ICD-10-CM | POA: Diagnosis not present

## 2018-01-16 DIAGNOSIS — E119 Type 2 diabetes mellitus without complications: Secondary | ICD-10-CM

## 2018-01-16 DIAGNOSIS — I5022 Chronic systolic (congestive) heart failure: Secondary | ICD-10-CM | POA: Diagnosis not present

## 2018-01-16 DIAGNOSIS — I255 Ischemic cardiomyopathy: Secondary | ICD-10-CM

## 2018-01-16 DIAGNOSIS — E785 Hyperlipidemia, unspecified: Secondary | ICD-10-CM | POA: Diagnosis not present

## 2018-01-16 DIAGNOSIS — Z95810 Presence of automatic (implantable) cardiac defibrillator: Secondary | ICD-10-CM | POA: Diagnosis not present

## 2018-01-16 NOTE — Progress Notes (Signed)
Cardiology Office Note    Date:  01/16/2018   ID:  JOLON DEGANTE, DOB 04-28-1934, MRN [id number]  PCP:  [name], MD  Cardiologist:  Dr. Martinique   Chief Complaint  Patient presents with  . Hospitalization Follow-up    discuss nitro patch and low bp    History of Present Illness:  Antonio Ray is a 84 y.o. male with PMH of CAD, CKD stage III, COPD, hyperlipidemia, DM 2, and chronic systolic heart failure and ischemic cardiomyopathy with baseline EF 30 to 35% s/p ICD.  Patient was recently had a remote myocardial infarction in 2014, he had emergent angioplasty of the LAD.  Cardiac catheterization in 2007 showed nonobstructive disease.  Myoview in 2011 showed anterior wall scar without ischemia.  Echocardiogram in September 2016 showed EF has dropped to 10 to 15% with global hypokinesis and akinesis of the anterior septum.  Since then, his ejection fraction has improved to 20 to 25% by echocardiogram in September 2017 during his hospitalization for COPD exacerbation.  He was last seen by Dr. Martinique in January 2019, he was complaining of cough at the time and has received a steroid taper.  Unfortunately, he was admitted for right lower lobe community-acquired pneumonia 4 days later.  He was tested positive for influenza A as well.  He received both Tamiflu and Rocephin, he was eventually discharged on Ceftin.  He was readmitted in March with worsening shortness of breath and chest pain, and found to have aspiration pneumonia.  He also had achalasia requiring GI evaluation.  Echocardiogram obtained on 10/15/2017 showed EF 20 to 25%, akinesis of the anteroseptal, anterior, and apical myocardium consistent with infarction in the distribution of LAD territory, further scar in the apical inferior and apical septal myocardium consistent with infarction in the RCA territory, grade 2 DD, mild to moderate MR.  In May 2019, he was admitted for possible sepsis.  Blood culture was negative.  More  recently he was readmitted in early June with aspiration pneumonia.  He was managed initially with Zosyn, later transitioned to Unasyn and Augmentin.  He did undergo repeated GI evaluation and underwent endoscopy on 6/5 and a subsequent dilatation.  Patient presents today for cardiology office visit.  His weight for the past 48-month has been 164 (3/18), 164 (5/24), 156 (6/5), 157 on home scale (6/18).  He displayed symptoms to be fairly stable on the current dose of diuretic.  Patient's daughter is unclear how much diuretic he has been receiving as it is the patient's son who is managing his medication.  He denies any chest discomfort or shortness of breath.  After the esophageal dilatation, he has been swallowing better and has been eating more.  He denies any further aspiration feeling.  He appears to be euvolemic on physical exam, he does have trace amount of edema in the lower extremity.  I would recommend continue his current dose of diuretic.  His daughter will confirm to see if he is indeed on 20 mg daily of Lasix at home.  He is not on any blood pressure medication except for 24-hour nitro patch.  According to the patient, he has been using the Nitropatch for antianginal purposes and blood pressure purpose since the 1990s.  We may consider switching this to Imdur at some point if he is no longer willing to use the patch.   Past Medical History:  Diagnosis Date  . Achalasia   . AICD (automatic cardioverter/defibrillator) present 03/30/2011   Analyze  ST study patient  . Anginal pain ([place])   . BPH (benign prostatic hyperplasia)   . CHF (congestive heart failure) ([place])   . Chronic systolic dysfunction of left ventricle    EF 30-35%, CLASS II - III SYMPTOMS; intolerant to Coreg  . CKD (chronic kidney disease) stage 3, GFR 30-59 ml/min ([name]) 06/08/2012  . COPD (chronic obstructive pulmonary disease) ([place])   . Coronary artery disease    History of remote anterior MI with PCI to LAD in 2006; most  recent cath 2007, no intervention required  . DOE (dyspnea on exertion)    with heavy exertion  . Dysrhythmia   . Esophageal dysmotility   . GERD (gastroesophageal reflux disease)   . Head injury, closed, with concussion 2000ish  . Heart murmur    hx  . HOH (hard of hearing)   . Hyperlipidemia   . Memory loss    improved  . Myocardial infarction ([place]) 1994   ANTERIOR  . Pneumonia "several times"   aspiration pna with at least 3 admits for this 2016.   Marland Kitchen PVC's (premature ventricular contractions)   . Renal failure   . Skin cancer "several"   "forearms; head"  . Type II diabetes mellitus ([place])     Past Surgical History:  Procedure Laterality Date  . BALLOON DILATION N/A 09/09/2015   Procedure: BALLOON DILATION;  Surgeon: Mauri Pole, MD;  Location: [facility];  Service: Endoscopy;  Laterality: N/A;  rigiflex achalasia balloon dilators  . BOTOX INJECTION N/A 04/08/2015   Procedure: BOTOX INJECTION;  Surgeon: Milus Banister, MD;  Location: [facility];  Service: Endoscopy;  Laterality: N/A;  . CARDIAC CATHETERIZATION  01/11/2006   DEMONSTRATES AKINESIA OF THE DISTAL ANTERIOR WALL, DISTAL INFERIOR WALL AND AKINESIA OF THE APEX. THE BASAL SEGMENTS CONTRACT WELL AND OVERALL EF 35%  . CATARACT EXTRACTION W/ INTRAOCULAR LENS  IMPLANT, BILATERAL  08/2014-09/2014  . CHOLECYSTECTOMY  2/11  . CORONARY ANGIOPLASTY  1994   TO THE LAD  . ESOPHAGEAL MANOMETRY N/A 03/16/2015   Procedure: ESOPHAGEAL MANOMETRY (EM);  Surgeon: Jerene Bears, MD;  Location: [facility];  Service: Gastroenterology;  Laterality: N/A;  . ESOPHAGOGASTRODUODENOSCOPY N/A 04/08/2015   Procedure: ESOPHAGOGASTRODUODENOSCOPY (EGD);  Surgeon: Milus Banister, MD;  Location: [facility];  Service: Endoscopy;  Laterality: N/A;  . ESOPHAGOGASTRODUODENOSCOPY N/A 01/03/2018   Procedure: ESOPHAGOGASTRODUODENOSCOPY (EGD);  Surgeon: Mauri Pole, MD;  Location: [facility];  Service: Endoscopy;  Laterality: N/A;  .  ESOPHAGOGASTRODUODENOSCOPY (EGD) WITH PROPOFOL N/A 09/09/2015   Procedure: ESOPHAGOGASTRODUODENOSCOPY (EGD) WITH PROPOFOL;  Surgeon: Mauri Pole, MD;  Location: [facility];  Service: Endoscopy;  Laterality: N/A;  pt. is to have gastrografin xray post recovery-do not discharge until results are back  . EYE SURGERY    . FOOT FRACTURE SURGERY Right 1980's  . INSERT / REPLACE / REMOVE PACEMAKER    . SAVORY DILATION N/A 01/03/2018   Procedure: RIGIFLEX DILATION;  Surgeon: Mauri Pole, MD;  Location: [facility];  Service: Endoscopy;  Laterality: N/A;  . SKIN CANCER EXCISION  "several"   "forearms, head"    Current Medications: Outpatient Medications Prior to Visit  Medication Sig Dispense Refill  . albuterol (PROVENTIL HFA;VENTOLIN HFA) 108 (90 Base) MCG/ACT inhaler Inhale 2 puffs into the lungs every 6 (six) hours as needed for wheezing or shortness of breath. 1 Inhaler 2  . aspirin 81 MG EC tablet Take 81 mg by mouth daily.      . Cholecalciferol (VITAMIN D) 2000  units tablet Take 2,000 Units by mouth daily.     Marland Kitchen donepezil (ARICEPT) 10 MG tablet Take 1 tablet (10 mg total) by mouth every morning. 90 tablet 4  . fenofibrate (TRICOR) 145 MG tablet TAKE 1 TABLET BY MOUTH EVERY DAY (Patient taking differently: Take 145 mg by mouth once a day) 90 tablet 3  . finasteride (PROSCAR) 5 MG tablet Take 5 mg by mouth daily.  0  . furosemide (LASIX) 20 MG tablet Take 1 tablet (20 mg total) by mouth daily. Take 2 tablets (40 mg daily) for 2 days, and then resume 1 tablet (20 mg) daily. 10 tablet 0  . insulin aspart (NOVOLOG) 100 UNIT/ML injection Inject 4-10 Units into the skin 3 (three) times daily before meals.    . Insulin Glargine (BASAGLAR KWIKPEN) 100 UNIT/ML SOPN INJECT 5 UNITS UNDER THE SKIN EVERY MORNING (Patient taking differently: Inject 8 Units into the skin every morning. ) 15 mL 0  . loratadine (CLARITIN) 10 MG tablet Take 1 tablet (10 mg total) by mouth daily. 30 tablet 1  .  memantine (NAMENDA) 10 MG tablet Take 1 tablet (10 mg total) by mouth 2 (two) times daily. 180 tablet 4  . Multiple Vitamins-Minerals (PRESERVISION AREDS 2) CAPS Take 1 capsule by mouth 2 (two) times daily.    . nitroGLYCERIN (NITRODUR - DOSED IN MG/24 HR) 0.2 mg/hr patch Place 1 patch (0.2 mg total) onto the skin daily. Alternates patch placement with odd/even days. (Patient taking differently: Place 0.2 mg onto the skin daily. ) 30 patch 6  . pantoprazole (PROTONIX) 40 MG tablet Take 1 tablet (40 mg total) by mouth 2 (two) times daily. (Patient taking differently: Take 40 mg by mouth daily. ) 60 tablet 0  . pravastatin (PRAVACHOL) 40 MG tablet Take 1 tablet (40 mg total) by mouth at bedtime. 90 tablet 3  . SPIRIVA HANDIHALER 18 MCG inhalation capsule INHALE CONTENTS OF 1 CAPSULE VIA HANDIHALER ONCE DAILY 30 capsule 11  . SYMBICORT 160-4.5 MCG/ACT inhaler INHALE 2 PUFFS BY MOUTH TWICE DAILY (Patient taking differently: Inhale 2 puffs into the lungs two times a day) 10.2 g 4  . amoxicillin-clavulanate (AUGMENTIN) 500-125 MG tablet Take 1 tablet (500 mg total) by mouth 3 (three) times daily. 4 tablet 0   No facility-administered medications prior to visit.      Allergies:   Codeine; Dilaudid [hydromorphone hcl]; Flomax [tamsulosin hcl]; Morphine and related; Sulfa antibiotics; Beta adrenergic blockers; and Carvedilol   Social History   Socioeconomic History  . Marital status: Widowed    Spouse name: Not on file  . Number of children: 2  . Years of education: GED  . Highest education level: Not on file  Occupational History  . Occupation: Clinical biochemist: [employer]    Comment: Retired  . Occupation: [employer]    Employer: [employer]    Comment: Retired  Scientific laboratory technician  . Financial resource strain: Patient refused  . Food insecurity:    Worry: Never true    Inability: Never true  . Transportation needs:    Medical: No    Non-medical: No  Tobacco Use  . Smoking  status: Former Smoker    Packs/day: 1.00    Years: 35.00    Pack years: 35.00    Types: Cigarettes    Last attempt to quit: 08/01/1992    Years since quitting: 25.4  . Smokeless tobacco: Former Systems developer    Quit date: 1994  Substance  and Sexual Activity  . Alcohol use: No    Alcohol/week: 0.0 oz  . Drug use: No  . Sexual activity: Not Currently  Lifestyle  . Physical activity:    Days per week: Patient refused    Minutes per session: Patient refused  . Stress: Patient refused  Relationships  . Social connections:    Talks on phone: Not on file    Gets together: Not on file    Attends religious service: Not on file    Active member of club or organization: Not on file    Attends meetings of clubs or organizations: Not on file    Relationship status: Not on file  Other Topics Concern  . Not on file  Social History Narrative   Pt lives in [place] alone.  Widowed.   Right-handed.   Rare caffeine use - maybe one cup per month.     Family History:  The patient's family history includes Stroke in his father; Venous thrombosis in his sister.   ROS:   Please see the history of present illness.    ROS All other systems reviewed and are negative.   PHYSICAL EXAM:   VS:  BP 115/68   Pulse 60   Ht 5\' 8"  (1.727 m)   Wt 163 lb 9.6 oz (74.2 kg)   SpO2 98%   BMI 24.88 kg/m    GEN: Well nourished, well developed, in no acute distress  HEENT: normal  Neck: no JVD, carotid bruits, or masses Cardiac: RRR; no murmurs, rubs, or gallops,no edema  Respiratory:  clear to auscultation bilaterally, normal work of breathing GI: soft, nontender, nondistended, + BS MS: no deformity or atrophy  Skin: warm and dry, no rash Neuro:  Alert and Oriented x 3, Strength and sensation are intact Psych: euthymic mood, full affect  Wt Readings from Last 3 Encounters:  01/16/18 163 lb 9.6 oz (74.2 kg)  01/03/18 156 lb (70.8 kg)  12/22/17 164 lb 14.5 oz (74.8 kg)      Studies/Labs Reviewed:    EKG:  EKG is not ordered today.    Recent Labs: 10/13/2017: B Natriuretic Peptide 398.4 01/02/2018: ALT 15; Magnesium 2.1; TSH 0.635 01/04/2018: Hemoglobin 10.6; Platelets 201 01/05/2018: BUN 12; Creatinine, Ser 1.35; Potassium 3.7; Sodium 143   Lipid Panel    Component Value Date/Time   CHOL 136 06/20/2013 0924   TRIG 142.0 06/20/2013 0924   HDL 32.00 (L) 06/20/2013 0924   CHOLHDL 4 06/20/2013 0924   VLDL 28.4 06/20/2013 0924   LDLCALC 76 06/20/2013 0924    Additional studies/ records that were reviewed today include:   Echo 10/15/2017 LV EF: 20% -   25%  Study Conclusions  - Left ventricle: The cavity size was moderately dilated. Wall   thickness was normal. Systolic function was severely reduced. The   estimated ejection fraction was in the range of 20% to 25%.   Akinesis of the anteroseptal, anterior, and apical myocardium;   consistent with infarction in the distribution of the left   anterior descending coronary artery. Akinesis and scarring of the   mid-apicalinferior and inferoseptal myocardium; consistent with   infarction in the distribution of the right coronary artery.   Features are consistent with a pseudonormal left ventricular   filling pattern, with concomitant abnormal relaxation and   increased filling pressure (grade 2 diastolic dysfunction).   Acoustic contrast opacification revealed no evidence ofthrombus. - Aortic valve: There was trivial regurgitation. - Mitral valve: There was mild to moderate  regurgitation directed   centrally. - Left atrium: The atrium was moderately dilated. - Right atrium: The atrium was mildly dilated.   ASSESSMENT:    1. Chronic systolic heart failure ([place])   2. Coronary artery disease involving native coronary artery of native heart without angina pectoris   3. CKD (chronic kidney disease), stage III ([place])   4. Hyperlipidemia, unspecified hyperlipidemia type   5. Controlled type 2 diabetes mellitus without complication,  without long-term current use of insulin ([place])   6. Ischemic cardiomyopathy   7. ICD (implantable cardioverter-defibrillator) in place      PLAN:  In order of problems listed above:  1. Chronic systolic heart failure: He appears to be euvolemic on physical exam.  Although he has been admitted 4 times in the past 6 months, it appears he was admitted more for the recurrent pneumonia and aspiration event.  Aspiration events has improved after he was treated for his achalasia with endoscopy with dilatation.  Continue on the current dose of diuretic.  He is unable to tolerate heart failure medication due to to current blood pressure  2. CAD: Continue aspirin, he is not on a beta-blocker.  He is however taking up 24-hour Nitropatch and has been on this medication since the 1990s.  3. Hyperlipidemia: Cholesterol well controlled on the current Pravachol  4. DM2: Managed by primary care provider  5. Ischemic cardiomyopathy s/p ICD: Patient has upcoming remote transmission    Medication Adjustments/Labs and Tests Ordered: Current medicines are reviewed at length with the patient today.  Concerns regarding medicines are outlined above.  Medication changes, Labs and Tests ordered today are listed in the Patient Instructions below. Patient Instructions  Medication Instructions: Your physician recommends that you continue on your current medications as directed.    If you need a refill on your cardiac medications before your next appointment, please call your pharmacy.     Follow-Up: Your physician wants you to follow-up in 6 months with Dr. Martinique. You will receive a reminder letter in the mail two months in advance. If you don't receive a letter, please call our office at [phone] to schedule this follow-up appointment.   Special Instructions: Call office with current lasix dose.   Thank you for choosing [facility] at [facility], [place]  01/16/2018 9:16 AM      [address], [place], [place]  [address] Phone: [phone]; Fax: [phone]

## 2018-01-16 NOTE — Patient Instructions (Addendum)
Medication Instructions: Your physician recommends that you continue on your current medications as directed.    If you need a refill on your cardiac medications before your next appointment, please call your pharmacy.     Follow-Up: Your physician wants you to follow-up in 6 months with Dr. Martinique. You will receive a reminder letter in the mail two months in advance. If you don't receive a letter, please call our office at [phone] to schedule this follow-up appointment.   Special Instructions: Call office with current lasix dose.   Thank you for choosing [facility] at [facility]!!

## 2018-01-18 DIAGNOSIS — L84 Corns and callosities: Secondary | ICD-10-CM | POA: Diagnosis not present

## 2018-01-18 DIAGNOSIS — L603 Nail dystrophy: Secondary | ICD-10-CM | POA: Diagnosis not present

## 2018-01-18 DIAGNOSIS — E1151 Type 2 diabetes mellitus with diabetic peripheral angiopathy without gangrene: Secondary | ICD-10-CM | POA: Diagnosis not present

## 2018-01-18 DIAGNOSIS — I739 Peripheral vascular disease, unspecified: Secondary | ICD-10-CM | POA: Diagnosis not present

## 2018-01-21 ENCOUNTER — Encounter: Payer: Self-pay | Admitting: Cardiology

## 2018-01-22 DIAGNOSIS — D649 Anemia, unspecified: Secondary | ICD-10-CM | POA: Diagnosis not present

## 2018-01-22 DIAGNOSIS — K220 Achalasia of cardia: Secondary | ICD-10-CM | POA: Diagnosis not present

## 2018-01-22 DIAGNOSIS — I5022 Chronic systolic (congestive) heart failure: Secondary | ICD-10-CM | POA: Diagnosis not present

## 2018-02-05 ENCOUNTER — Ambulatory Visit (INDEPENDENT_AMBULATORY_CARE_PROVIDER_SITE_OTHER): Payer: Medicare Other | Admitting: Internal Medicine

## 2018-02-05 ENCOUNTER — Encounter: Payer: Self-pay | Admitting: Internal Medicine

## 2018-02-05 VITALS — BP 100/58 | HR 64 | Ht 68.0 in | Wt 169.4 lb

## 2018-02-05 DIAGNOSIS — I255 Ischemic cardiomyopathy: Secondary | ICD-10-CM

## 2018-02-05 DIAGNOSIS — Z794 Long term (current) use of insulin: Secondary | ICD-10-CM

## 2018-02-05 DIAGNOSIS — E1122 Type 2 diabetes mellitus with diabetic chronic kidney disease: Secondary | ICD-10-CM | POA: Diagnosis not present

## 2018-02-05 DIAGNOSIS — E785 Hyperlipidemia, unspecified: Secondary | ICD-10-CM

## 2018-02-05 DIAGNOSIS — N183 Chronic kidney disease, stage 3 unspecified: Secondary | ICD-10-CM

## 2018-02-05 MED ORDER — BASAGLAR KWIKPEN 100 UNIT/ML ~~LOC~~ SOPN: PEN_INJECTOR | SUBCUTANEOUS | 0 refills | Status: DC

## 2018-02-05 MED ORDER — GLUCOSE BLOOD VI STRP: ORAL_STRIP | 3 refills | Status: AC

## 2018-02-05 MED ORDER — INSULIN ASPART 100 UNIT/ML ~~LOC~~ SOLN: 4.0000 [IU] | Freq: Three times a day (TID) | SUBCUTANEOUS | 5 refills | Status: DC

## 2018-02-05 MED ORDER — INSULIN PEN NEEDLE 32G X 4 MM MISC: 3 refills | Status: AC

## 2018-02-05 NOTE — Progress Notes (Signed)
Patient ID: Antonio Ray, male   DOB: 08/11/1933, 84 y.o.   MRN: [id number]   HPI: Antonio Ray is a 84 y.o.-year-old male, returning for f/u for DM2, dx in 2010, insulin-dependent since ~2012, uncontrolled, with complications (PN, CKD stage 3-4, CAD, CHF - ICD). He is here with his son who offers most of the history, especially regarding blood sugar checks and insulin dosing. Last visit 4 months ago.  Since last visit, he was admitted 3 times: With chest pain, then sepsis, then fever 01/01/2018.  And HbA1c checked at the time of the last admission was lower, at 6.6%.  He had Es stretching 1 mo ago.  Last hemoglobin A1c was: Lab Results  Component Value Date   HGBA1C 6.6 (H) 01/02/2018   HGBA1C 6.9 (H) 10/13/2017   HGBA1C 6.9 10/02/2017   He had a lot of steroids for COPD. He had a severe PNA episode in 2016 2/2 aspiration 2/2 achalasia. He had several admissions >> on repeated Solumedrol doses. Since then >> occasional COPD exacerbations >>  get Solu-Medrol and prednisone tapers.  He is on: - Basaglar 10 >> 6 units in am - Novolog 0-4 units before meals He adds 1-2 units to Novolog to the mealtime insulin if on steroids.  Pt checks his sugars 3X a day: - am:76-124, 138 >> 76, 84-127 >> 77-120 - 2h after b'fast: n/c >> 151-246 >> n/c >> 90-164 - before lunch: 87-154, 177 >> 139-188, 216 >> 99-130 - 2h after lunch: 93, 106-180, 233 >> n/c >> 108-165, 185 - before dinner: 68-152, 166, 223, 253 >> 95-163, 184 >> 95-154 - 2h after dinner:  107-176, 211, 240 >> n/c >> 79, 139-183 - bedtime: n/c >> 114-187 >> n/c - nighttime: n/c Lowest sugar was 69 >> 76 >> 77. Highest sugar was 253 >> 200s (steroids) >> 183.  Glucometer: Freestyle Lite  Pt's meals are: - Breakfast: bacon, eggs, hash browns - Lunch: sandwich or soup - Dinner: varies, meat + veggies - Snacks: 1-2  He still tries to walk as much as he can.  -+ CKD, last BUN/creatinine:  Lab Results  Component Value Date   BUN 12 01/05/2018   BUN 14 01/04/2018   CREATININE 1.35 (H) 01/05/2018   CREATININE 1.30 (H) 01/04/2018   - + HL; last set of lipids: 11/01/2017: 126/94/50/57 02/05/2016: 144/223/40/59 Lab Results  Component Value Date   CHOL 136 06/20/2013   HDL 32.00 (L) 06/20/2013   LDLCALC 76 06/20/2013   TRIG 142.0 06/20/2013   CHOLHDL 4 06/20/2013  On Pravastatin. - last eye exam was in 03/2017: no DR reportedly. Dr. Marica Otter. Had cataract Sx in 2016.  History of macular degeneration. - No numbness and tingling in his feet.  Has a foot ulcer 11/2014 >> healed well (2016). He has DM shoes. R foot more swollen after his fx >> 20 years ago.  He sees a podiatrist.  ROS: Constitutional: + weight gain/no weight loss, no fatigue, no subjective hyperthermia, no subjective hypothermia Eyes: no blurry vision, no xerophthalmia ENT: no sore throat, no nodules palpated in throat, no dysphagia, no odynophagia, no hoarseness Cardiovascular: no CP/no SOB/no palpitations/no leg swelling Respiratory: no cough/no SOB/no wheezing Gastrointestinal: no N/no V/no D/no C/no acid reflux Musculoskeletal: no muscle aches/no joint aches Skin: no rashes, no hair loss Neurological: no tremors/no numbness/no tingling/no dizziness  I reviewed pt's medications, allergies, PMH, social hx, family hx, and changes were documented in the history of present illness. Otherwise, unchanged from my  initial visit note. Lasix dose decreased from 40 to 20 mg daily.  Past Medical History:  Diagnosis Date  . Achalasia   . AICD (automatic cardioverter/defibrillator) present 03/30/2011   Analyze ST study patient  . Anginal pain ([place])   . BPH (benign prostatic hyperplasia)   . CHF (congestive heart failure) ([place])   . Chronic systolic dysfunction of left ventricle    EF 30-35%, CLASS II - III SYMPTOMS; intolerant to Coreg  . CKD (chronic kidney disease) stage 3, GFR 30-59 ml/min ([name]) 06/08/2012  . COPD (chronic obstructive  pulmonary disease) ([place])   . Coronary artery disease    History of remote anterior MI with PCI to LAD in 2006; most recent cath 2007, no intervention required  . DOE (dyspnea on exertion)    with heavy exertion  . Dysrhythmia   . Esophageal dysmotility   . GERD (gastroesophageal reflux disease)   . Head injury, closed, with concussion 2000ish  . Heart murmur    hx  . HOH (hard of hearing)   . Hyperlipidemia   . Memory loss    improved  . Myocardial infarction ([place]) 1994   ANTERIOR  . Pneumonia "several times"   aspiration pna with at least 3 admits for this 2016.   Marland Kitchen PVC's (premature ventricular contractions)   . Renal failure   . Skin cancer "several"   "forearms; head"  . Type II diabetes mellitus ([place])    Past Surgical History:  Procedure Laterality Date  . BALLOON DILATION N/A 09/09/2015   Procedure: BALLOON DILATION;  Surgeon: Mauri Pole, MD;  Location: [facility];  Service: Endoscopy;  Laterality: N/A;  rigiflex achalasia balloon dilators  . BOTOX INJECTION N/A 04/08/2015   Procedure: BOTOX INJECTION;  Surgeon: Milus Banister, MD;  Location: [facility];  Service: Endoscopy;  Laterality: N/A;  . CARDIAC CATHETERIZATION  01/11/2006   DEMONSTRATES AKINESIA OF THE DISTAL ANTERIOR WALL, DISTAL INFERIOR WALL AND AKINESIA OF THE APEX. THE BASAL SEGMENTS CONTRACT WELL AND OVERALL EF 35%  . CATARACT EXTRACTION W/ INTRAOCULAR LENS  IMPLANT, BILATERAL  08/2014-09/2014  . CHOLECYSTECTOMY  2/11  . CORONARY ANGIOPLASTY  1994   TO THE LAD  . ESOPHAGEAL MANOMETRY N/A 03/16/2015   Procedure: ESOPHAGEAL MANOMETRY (EM);  Surgeon: Jerene Bears, MD;  Location: [facility];  Service: Gastroenterology;  Laterality: N/A;  . ESOPHAGOGASTRODUODENOSCOPY N/A 04/08/2015   Procedure: ESOPHAGOGASTRODUODENOSCOPY (EGD);  Surgeon: Milus Banister, MD;  Location: [facility];  Service: Endoscopy;  Laterality: N/A;  . ESOPHAGOGASTRODUODENOSCOPY N/A 01/03/2018   Procedure: ESOPHAGOGASTRODUODENOSCOPY  (EGD);  Surgeon: Mauri Pole, MD;  Location: [facility];  Service: Endoscopy;  Laterality: N/A;  . ESOPHAGOGASTRODUODENOSCOPY (EGD) WITH PROPOFOL N/A 09/09/2015   Procedure: ESOPHAGOGASTRODUODENOSCOPY (EGD) WITH PROPOFOL;  Surgeon: Mauri Pole, MD;  Location: [facility];  Service: Endoscopy;  Laterality: N/A;  pt. is to have gastrografin xray post recovery-do not discharge until results are back  . EYE SURGERY    . FOOT FRACTURE SURGERY Right 1980's  . INSERT / REPLACE / REMOVE PACEMAKER    . SAVORY DILATION N/A 01/03/2018   Procedure: RIGIFLEX DILATION;  Surgeon: Mauri Pole, MD;  Location: [facility];  Service: Endoscopy;  Laterality: N/A;  . SKIN CANCER EXCISION  "several"   "forearms, head"   Social History   Socioeconomic History  . Marital status: Widowed    Spouse name: Not on file  . Number of children: 2  . Years of education: GED  . Highest education level:  Not on file  Occupational History  . Occupation: Clinical biochemist: [employer]    Comment: Retired  . Occupation: [employer]    Employer: [employer]    Comment: Retired  Scientific laboratory technician  . Financial resource strain: Patient refused  . Food insecurity:    Worry: Never true    Inability: Never true  . Transportation needs:    Medical: No    Non-medical: No  Tobacco Use  . Smoking status: Former Smoker    Packs/day: 1.00    Years: 35.00    Pack years: 35.00    Types: Cigarettes    Last attempt to quit: 08/01/1992    Years since quitting: 25.5  . Smokeless tobacco: Former Systems developer    Quit date: 1994  Substance and Sexual Activity  . Alcohol use: No    Alcohol/week: 0.0 oz  . Drug use: No  . Sexual activity: Not Currently  Lifestyle  . Physical activity:    Days per week: Patient refused    Minutes per session: Patient refused  . Stress: Patient refused  Relationships  . Social connections:    Talks on phone: Not on file    Gets together: Not on file    Attends  religious service: Not on file    Active member of club or organization: Not on file    Attends meetings of clubs or organizations: Not on file    Relationship status: Not on file  . Intimate partner violence:    Fear of current or ex partner: Not on file    Emotionally abused: Not on file    Physically abused: Not on file    Forced sexual activity: Not on file  Other Topics Concern  . Not on file  Social History Narrative   Pt lives in [place] alone.  Widowed.   Right-handed.   Rare caffeine use - maybe one cup per month.   Current Outpatient Medications on File Prior to Visit  Medication Sig Dispense Refill  . albuterol (PROVENTIL HFA;VENTOLIN HFA) 108 (90 Base) MCG/ACT inhaler Inhale 2 puffs into the lungs every 6 (six) hours as needed for wheezing or shortness of breath. 1 Inhaler 2  . aspirin 81 MG EC tablet Take 81 mg by mouth daily.      . Cholecalciferol (VITAMIN D) 2000 units tablet Take 2,000 Units by mouth daily.     Marland Kitchen donepezil (ARICEPT) 10 MG tablet Take 1 tablet (10 mg total) by mouth every morning. 90 tablet 4  . fenofibrate (TRICOR) 145 MG tablet TAKE 1 TABLET BY MOUTH EVERY DAY (Patient taking differently: Take 145 mg by mouth once a day) 90 tablet 3  . finasteride (PROSCAR) 5 MG tablet Take 5 mg by mouth daily.  0  . furosemide (LASIX) 20 MG tablet Take 1 tablet (20 mg total) by mouth daily. Take 2 tablets (40 mg daily) for 2 days, and then resume 1 tablet (20 mg) daily. 10 tablet 0  . insulin aspart (NOVOLOG) 100 UNIT/ML injection Inject 4-10 Units into the skin 3 (three) times daily before meals.    . Insulin Glargine (BASAGLAR KWIKPEN) 100 UNIT/ML SOPN INJECT 5 UNITS UNDER THE SKIN EVERY MORNING (Patient taking differently: Inject 8 Units into the skin every morning. ) 15 mL 0  . loratadine (CLARITIN) 10 MG tablet Take 1 tablet (10 mg total) by mouth daily. 30 tablet 1  . memantine (NAMENDA) 10 MG tablet Take 1 tablet (10 mg total) by  mouth 2 (two) times daily. 180  tablet 4  . Multiple Vitamins-Minerals (PRESERVISION AREDS 2) CAPS Take 1 capsule by mouth 2 (two) times daily.    . nitroGLYCERIN (NITRODUR - DOSED IN MG/24 HR) 0.2 mg/hr patch Place 1 patch (0.2 mg total) onto the skin daily. Alternates patch placement with odd/even days. (Patient taking differently: Place 0.2 mg onto the skin daily. ) 30 patch 6  . pantoprazole (PROTONIX) 40 MG tablet Take 1 tablet (40 mg total) by mouth 2 (two) times daily. (Patient taking differently: Take 40 mg by mouth daily. ) 60 tablet 0  . pravastatin (PRAVACHOL) 40 MG tablet Take 1 tablet (40 mg total) by mouth at bedtime. 90 tablet 3  . SPIRIVA HANDIHALER 18 MCG inhalation capsule INHALE CONTENTS OF 1 CAPSULE VIA HANDIHALER ONCE DAILY 30 capsule 11  . SYMBICORT 160-4.5 MCG/ACT inhaler INHALE 2 PUFFS BY MOUTH TWICE DAILY (Patient taking differently: Inhale 2 puffs into the lungs two times a day) 10.2 g 4   No current facility-administered medications on file prior to visit.    Allergies  Allergen Reactions  . Codeine Other (See Comments)    Gets very angry, disoriented  . Dilaudid [Hydromorphone Hcl] Other (See Comments)    VERY AGITATED, HOSTILE  . Flomax [Tamsulosin Hcl] Shortness Of Breath  . Morphine And Related Other (See Comments)    VERY AGITATED, HOSTILE  . Sulfa Antibiotics Shortness Of Breath  . Beta Adrenergic Blockers Other (See Comments)    Disorientation  . Carvedilol Other (See Comments)    DISORIENTATION   Family History  Problem Relation Age of Onset  . Stroke Father        Mother history unknown - never met her  . Venous thrombosis Sister   . Colon cancer Neg Hx     PE: There were no vitals taken for this visit. Wt Readings from Last 3 Encounters:  01/16/18 163 lb 9.6 oz (74.2 kg)  01/03/18 156 lb (70.8 kg)  12/22/17 164 lb 14.5 oz (74.8 kg)   Constitutional: overweight, in NAD Eyes: PERRLA, EOMI, no exophthalmos ENT: moist mucous membranes, no thyromegaly, no cervical  lymphadenopathy Cardiovascular: RRR, No MRG Respiratory: CTA B Gastrointestinal: abdomen soft, NT, ND, BS+ Musculoskeletal: no deformities, strength intact in all 4 Skin: moist, warm, no rashes Neurological: no tremor with outstretched hands, DTR normal in all 4  ASSESSMENT: 1. DM2, insulin-dependent, uncontrolled, with complications - PN - stable - CKD stage 3-4  - CAD, CHF - ICD  2. HL  PLAN:  1. Patient with long-standing, previously uncontrolled, type 2 diabetes, insulin-dependent, on the basal-bolus insulin regimen, with improving control.  We were able to decrease his insulin doses gradually.  His HbA1c improved even on lower insulin doses.  At last visit sugars were mostly at goal, with  few hyperglycemic spikes during his illness in 08/2017 or when he was skipping NovoLog dose.  Since last visit, he was sick and was hospitalized several times, despite this, his sugars did not increase too much. - reviewed together latest HbA1c from the hospitalization in June, and this was at goal, improved, at 6.6% - reviewing his sugars logs, sugars are at or close to goal, even if skips Novolog when he eats a small meal or if sugars before meals <100 - No changes are needed in his regimen for now, but we did discuss about the possibility of stopping Basaglar at least for 1 week to see how sugars change >> if they increase >> will  need to restart - I suggested to:  Patient Instructions  Please continue: - Basaglar 6 units in am - Novolog 0-4 units before meals  Please try to stop Basaglar for 1 week to see how the sugars change.  Please return in 4 months with your sugar log.   - continue checking sugars at different times of the day - check 3x a day, rotating checks - advised for yearly eye exams >> he is UTD - Return to clinic in 4 mo with sugar log     2. HL - Reviewed latest lipid panel from 10/2017: all at goal  - Continues atorvastatin without side effects.   Philemon Kingdom,  MD PhD [facility] Endocrinology

## 2018-02-05 NOTE — Patient Instructions (Addendum)
Please continue: - Basaglar 6 units in am - Novolog 0-4 units before meals  Please try to stop Basaglar for 1 week to see how the sugars change.  Please return in 4 months with your sugar log.

## 2018-02-13 ENCOUNTER — Observation Stay (HOSPITAL_COMMUNITY)
Admission: EM | Admit: 2018-02-13 | Discharge: 2018-02-14 | Disposition: A | Discharge status: Home / Self Care | Payer: Medicare Other | Attending: Internal Medicine | Admitting: Internal Medicine

## 2018-02-13 ENCOUNTER — Other Ambulatory Visit: Payer: Self-pay

## 2018-02-13 ENCOUNTER — Telehealth: Payer: Self-pay | Admitting: Pulmonary Disease

## 2018-02-13 ENCOUNTER — Encounter (HOSPITAL_COMMUNITY): Payer: Self-pay | Admitting: *Deleted

## 2018-02-13 ENCOUNTER — Emergency Department (HOSPITAL_COMMUNITY): Payer: Medicare Other

## 2018-02-13 DIAGNOSIS — R0602 Shortness of breath: Secondary | ICD-10-CM | POA: Diagnosis not present

## 2018-02-13 DIAGNOSIS — R4182 Altered mental status, unspecified: Secondary | ICD-10-CM | POA: Diagnosis not present

## 2018-02-13 DIAGNOSIS — E1122 Type 2 diabetes mellitus with diabetic chronic kidney disease: Secondary | ICD-10-CM | POA: Insufficient documentation

## 2018-02-13 DIAGNOSIS — I495 Sick sinus syndrome: Secondary | ICD-10-CM | POA: Insufficient documentation

## 2018-02-13 DIAGNOSIS — Z882 Allergy status to sulfonamides status: Secondary | ICD-10-CM | POA: Insufficient documentation

## 2018-02-13 DIAGNOSIS — R531 Weakness: Secondary | ICD-10-CM | POA: Diagnosis not present

## 2018-02-13 DIAGNOSIS — F0390 Unspecified dementia without behavioral disturbance: Secondary | ICD-10-CM | POA: Diagnosis not present

## 2018-02-13 DIAGNOSIS — I5042 Chronic combined systolic (congestive) and diastolic (congestive) heart failure: Secondary | ICD-10-CM | POA: Diagnosis not present

## 2018-02-13 DIAGNOSIS — E785 Hyperlipidemia, unspecified: Secondary | ICD-10-CM | POA: Diagnosis not present

## 2018-02-13 DIAGNOSIS — Z794 Long term (current) use of insulin: Secondary | ICD-10-CM | POA: Diagnosis not present

## 2018-02-13 DIAGNOSIS — N183 Chronic kidney disease, stage 3 unspecified: Secondary | ICD-10-CM | POA: Diagnosis present

## 2018-02-13 DIAGNOSIS — I255 Ischemic cardiomyopathy: Secondary | ICD-10-CM | POA: Diagnosis not present

## 2018-02-13 DIAGNOSIS — Z7982 Long term (current) use of aspirin: Secondary | ICD-10-CM | POA: Diagnosis not present

## 2018-02-13 DIAGNOSIS — Z9981 Dependence on supplemental oxygen: Secondary | ICD-10-CM | POA: Insufficient documentation

## 2018-02-13 DIAGNOSIS — I252 Old myocardial infarction: Secondary | ICD-10-CM | POA: Diagnosis not present

## 2018-02-13 DIAGNOSIS — Z9842 Cataract extraction status, left eye: Secondary | ICD-10-CM | POA: Insufficient documentation

## 2018-02-13 DIAGNOSIS — Z9841 Cataract extraction status, right eye: Secondary | ICD-10-CM | POA: Diagnosis not present

## 2018-02-13 DIAGNOSIS — N400 Enlarged prostate without lower urinary tract symptoms: Secondary | ICD-10-CM | POA: Insufficient documentation

## 2018-02-13 DIAGNOSIS — R0902 Hypoxemia: Secondary | ICD-10-CM | POA: Diagnosis not present

## 2018-02-13 DIAGNOSIS — Z79899 Other long term (current) drug therapy: Secondary | ICD-10-CM | POA: Insufficient documentation

## 2018-02-13 DIAGNOSIS — K219 Gastro-esophageal reflux disease without esophagitis: Secondary | ICD-10-CM | POA: Insufficient documentation

## 2018-02-13 DIAGNOSIS — J9621 Acute and chronic respiratory failure with hypoxia: Secondary | ICD-10-CM | POA: Diagnosis not present

## 2018-02-13 DIAGNOSIS — J90 Pleural effusion, not elsewhere classified: Secondary | ICD-10-CM | POA: Insufficient documentation

## 2018-02-13 DIAGNOSIS — N179 Acute kidney failure, unspecified: Secondary | ICD-10-CM | POA: Diagnosis not present

## 2018-02-13 DIAGNOSIS — R05 Cough: Secondary | ICD-10-CM | POA: Diagnosis not present

## 2018-02-13 DIAGNOSIS — R000 Tachycardia, unspecified: Secondary | ICD-10-CM | POA: Diagnosis not present

## 2018-02-13 DIAGNOSIS — R413 Other amnesia: Secondary | ICD-10-CM | POA: Diagnosis not present

## 2018-02-13 DIAGNOSIS — Z85828 Personal history of other malignant neoplasm of skin: Secondary | ICD-10-CM | POA: Diagnosis not present

## 2018-02-13 DIAGNOSIS — J441 Chronic obstructive pulmonary disease with (acute) exacerbation: Secondary | ICD-10-CM | POA: Insufficient documentation

## 2018-02-13 DIAGNOSIS — I2510 Atherosclerotic heart disease of native coronary artery without angina pectoris: Secondary | ICD-10-CM | POA: Insufficient documentation

## 2018-02-13 DIAGNOSIS — Z87891 Personal history of nicotine dependence: Secondary | ICD-10-CM | POA: Insufficient documentation

## 2018-02-13 DIAGNOSIS — R410 Disorientation, unspecified: Secondary | ICD-10-CM | POA: Diagnosis not present

## 2018-02-13 DIAGNOSIS — I959 Hypotension, unspecified: Secondary | ICD-10-CM | POA: Diagnosis not present

## 2018-02-13 DIAGNOSIS — Z95810 Presence of automatic (implantable) cardiac defibrillator: Secondary | ICD-10-CM | POA: Diagnosis not present

## 2018-02-13 DIAGNOSIS — Z885 Allergy status to narcotic agent status: Secondary | ICD-10-CM | POA: Insufficient documentation

## 2018-02-13 DIAGNOSIS — J449 Chronic obstructive pulmonary disease, unspecified: Secondary | ICD-10-CM | POA: Diagnosis present

## 2018-02-13 HISTORY — DX: Dyspnea, unspecified: R06.00

## 2018-02-13 LAB — I-STAT TROPONIN, ED: TROPONIN I, POC: 0.1 ng/mL — AB (ref 0.00–0.08)

## 2018-02-13 LAB — COMPREHENSIVE METABOLIC PANEL
ALT: 17 U/L (ref 0–44)
ANION GAP: 10 (ref 5–15)
AST: 26 U/L (ref 15–41)
Albumin: 3.3 g/dL — ABNORMAL LOW (ref 3.5–5.0)
Alkaline Phosphatase: 36 U/L — ABNORMAL LOW (ref 38–126)
BUN: 32 mg/dL — ABNORMAL HIGH (ref 8–23)
CHLORIDE: 104 mmol/L (ref 98–111)
CO2: 26 mmol/L (ref 22–32)
Calcium: 9.2 mg/dL (ref 8.9–10.3)
Creatinine, Ser: 1.81 mg/dL — ABNORMAL HIGH (ref 0.61–1.24)
GFR calc non Af Amer: 33 mL/min — ABNORMAL LOW (ref >60)
GFR, EST AFRICAN AMERICAN: 38 mL/min — AB (ref >60)
Glucose, Bld: 116 mg/dL — ABNORMAL HIGH (ref 70–99)
POTASSIUM: 4.2 mmol/L (ref 3.5–5.1)
SODIUM: 140 mmol/L (ref 135–145)
Total Bilirubin: 0.8 mg/dL (ref 0.3–1.2)
Total Protein: 6.5 g/dL (ref 6.5–8.1)

## 2018-02-13 LAB — CBC WITH DIFFERENTIAL/PLATELET
ABS IMMATURE GRANULOCYTES: 0 10*3/uL (ref 0.0–0.1)
BASOS ABS: 0 10*3/uL (ref 0.0–0.1)
BASOS PCT: 0 %
EOS ABS: 0.1 10*3/uL (ref 0.0–0.7)
EOS PCT: 1 %
HCT: 40.4 % (ref 39.0–52.0)
Hemoglobin: 12 g/dL — ABNORMAL LOW (ref 13.0–17.0)
Immature Granulocytes: 0 %
Lymphocytes Relative: 14 %
Lymphs Abs: 1.3 10*3/uL (ref 0.7–4.0)
MCH: 27.5 pg (ref 26.0–34.0)
MCHC: 29.7 g/dL — AB (ref 30.0–36.0)
MCV: 92.4 fL (ref 78.0–100.0)
MONO ABS: 0.9 10*3/uL (ref 0.1–1.0)
Monocytes Relative: 9 %
NEUTROS ABS: 7.2 10*3/uL (ref 1.7–7.7)
Neutrophils Relative %: 76 %
PLATELETS: 186 10*3/uL (ref 150–400)
RBC: 4.37 MIL/uL (ref 4.22–5.81)
RDW: 15.7 % — AB (ref 11.5–15.5)
WBC: 9.6 10*3/uL (ref 4.0–10.5)

## 2018-02-13 LAB — I-STAT ARTERIAL BLOOD GAS, ED
ACID-BASE EXCESS: 2 mmol/L (ref 0.0–2.0)
Bicarbonate: 26.9 mmol/L (ref 20.0–28.0)
O2 Saturation: 93 %
PH ART: 7.41 (ref 7.350–7.450)
PO2 ART: 68 mmHg — AB (ref 83.0–108.0)
TCO2: 28 mmol/L (ref 22–32)
pCO2 arterial: 42.4 mmHg (ref 32.0–48.0)

## 2018-02-13 LAB — URINALYSIS, ROUTINE W REFLEX MICROSCOPIC
BILIRUBIN URINE: NEGATIVE
Glucose, UA: NEGATIVE mg/dL
HGB URINE DIPSTICK: NEGATIVE
KETONES UR: NEGATIVE mg/dL
Leukocytes, UA: NEGATIVE
Nitrite: NEGATIVE
Protein, ur: NEGATIVE mg/dL
Specific Gravity, Urine: 1.017 (ref 1.005–1.030)
pH: 6 (ref 5.0–8.0)

## 2018-02-13 LAB — GLUCOSE, CAPILLARY
Glucose-Capillary: 267 mg/dL — ABNORMAL HIGH (ref 70–99)
Glucose-Capillary: 227 mg/dL — ABNORMAL HIGH (ref 70–99)

## 2018-02-13 LAB — I-STAT CG4 LACTIC ACID, ED: Lactic Acid, Venous: 0.65 mmol/L (ref 0.5–1.9)

## 2018-02-13 MED ORDER — FENOFIBRATE 160 MG PO TABS
160.0000 mg | ORAL_TABLET | Freq: Every day | ORAL | Status: DC
  Administered 2018-02-13 – 2018-02-14 (×2): 160 mg via ORAL
  Filled 2018-02-13 (×2): qty 1

## 2018-02-13 MED ORDER — PRESERVISION AREDS 2 PO CAPS: 1.0000 | ORAL_CAPSULE | Freq: Two times a day (BID) | ORAL | Status: DC

## 2018-02-13 MED ORDER — IPRATROPIUM-ALBUTEROL 0.5-2.5 (3) MG/3ML IN SOLN
3.0000 mL | Freq: Four times a day (QID) | RESPIRATORY_TRACT | Status: DC
  Administered 2018-02-13: 3 mL via RESPIRATORY_TRACT
  Filled 2018-02-13: qty 3

## 2018-02-13 MED ORDER — ALBUTEROL SULFATE (2.5 MG/3ML) 0.083% IN NEBU: 2.5000 mg | INHALATION_SOLUTION | RESPIRATORY_TRACT | Status: DC | PRN

## 2018-02-13 MED ORDER — FUROSEMIDE 20 MG PO TABS
20.0000 mg | ORAL_TABLET | Freq: Every day | ORAL | Status: DC
  Administered 2018-02-13 – 2018-02-14 (×2): 20 mg via ORAL
  Filled 2018-02-13 (×2): qty 1

## 2018-02-13 MED ORDER — FINASTERIDE 5 MG PO TABS
5.0000 mg | ORAL_TABLET | Freq: Every day | ORAL | Status: DC
  Administered 2018-02-13 – 2018-02-14 (×2): 5 mg via ORAL
  Filled 2018-02-13 (×3): qty 1

## 2018-02-13 MED ORDER — ASPIRIN 81 MG PO TBEC: 81.0000 mg | DELAYED_RELEASE_TABLET | Freq: Every day | ORAL | Status: DC

## 2018-02-13 MED ORDER — METHYLPREDNISOLONE SODIUM SUCC 125 MG IJ SOLR
125.0000 mg | Freq: Once | INTRAMUSCULAR | Status: AC
  Administered 2018-02-13: 125 mg via INTRAVENOUS
  Filled 2018-02-13: qty 2

## 2018-02-13 MED ORDER — ACETAMINOPHEN 650 MG RE SUPP: 650.0000 mg | Freq: Four times a day (QID) | RECTAL | Status: DC | PRN

## 2018-02-13 MED ORDER — ONDANSETRON HCL 4 MG/2ML IJ SOLN
4.0000 mg | Freq: Four times a day (QID) | INTRAMUSCULAR | Status: DC | PRN
Start: 2018-02-13 — End: 2018-02-14

## 2018-02-13 MED ORDER — IPRATROPIUM-ALBUTEROL 0.5-2.5 (3) MG/3ML IN SOLN
3.0000 mL | Freq: Three times a day (TID) | RESPIRATORY_TRACT | Status: DC
  Administered 2018-02-13 – 2018-02-14 (×3): 3 mL via RESPIRATORY_TRACT
  Filled 2018-02-13 (×3): qty 3

## 2018-02-13 MED ORDER — PROSIGHT PO TABS
1.0000 | ORAL_TABLET | Freq: Every day | ORAL | Status: DC
  Administered 2018-02-13 – 2018-02-14 (×2): 1 via ORAL
  Filled 2018-02-13 (×2): qty 1

## 2018-02-13 MED ORDER — ACETAMINOPHEN 325 MG PO TABS: 650.0000 mg | ORAL_TABLET | Freq: Four times a day (QID) | ORAL | Status: DC | PRN

## 2018-02-13 MED ORDER — INSULIN GLARGINE 100 UNIT/ML ~~LOC~~ SOLN
6.0000 [IU] | Freq: Every day | SUBCUTANEOUS | Status: DC
  Administered 2018-02-14: 6 [IU] via SUBCUTANEOUS
  Filled 2018-02-13 (×4): qty 0.06

## 2018-02-13 MED ORDER — IPRATROPIUM BROMIDE 0.02 % IN SOLN
0.5000 mg | Freq: Once | RESPIRATORY_TRACT | Status: AC
  Administered 2018-02-13: 0.5 mg via RESPIRATORY_TRACT
  Filled 2018-02-13: qty 2.5

## 2018-02-13 MED ORDER — PANTOPRAZOLE SODIUM 40 MG PO TBEC
40.0000 mg | DELAYED_RELEASE_TABLET | Freq: Every day | ORAL | Status: DC
  Administered 2018-02-13 – 2018-02-14 (×2): 40 mg via ORAL
  Filled 2018-02-13 (×2): qty 1

## 2018-02-13 MED ORDER — INSULIN ASPART 100 UNIT/ML ~~LOC~~ SOLN
0.0000 [IU] | Freq: Three times a day (TID) | SUBCUTANEOUS | Status: DC
  Administered 2018-02-13: 8 [IU] via SUBCUTANEOUS
  Administered 2018-02-14: 2 [IU] via SUBCUTANEOUS
  Administered 2018-02-14: 3 [IU] via SUBCUTANEOUS

## 2018-02-13 MED ORDER — MOMETASONE FURO-FORMOTEROL FUM 200-5 MCG/ACT IN AERO
2.0000 | INHALATION_SPRAY | Freq: Two times a day (BID) | RESPIRATORY_TRACT | Status: DC
  Administered 2018-02-13 – 2018-02-14 (×2): 2 via RESPIRATORY_TRACT
  Filled 2018-02-13: qty 8.8

## 2018-02-13 MED ORDER — DOCUSATE SODIUM 100 MG PO CAPS
100.0000 mg | ORAL_CAPSULE | Freq: Two times a day (BID) | ORAL | Status: DC
  Administered 2018-02-13 – 2018-02-14 (×2): 100 mg via ORAL
  Filled 2018-02-13 (×2): qty 1

## 2018-02-13 MED ORDER — PREDNISONE 20 MG PO TABS
40.0000 mg | ORAL_TABLET | Freq: Every day | ORAL | Status: DC
  Administered 2018-02-14: 40 mg via ORAL
  Filled 2018-02-13: qty 2

## 2018-02-13 MED ORDER — NITROGLYCERIN 0.2 MG/HR TD PT24
0.2000 mg | MEDICATED_PATCH | Freq: Every day | TRANSDERMAL | Status: DC
  Administered 2018-02-13 – 2018-02-14 (×2): 0.2 mg via TRANSDERMAL
  Filled 2018-02-13 (×2): qty 1

## 2018-02-13 MED ORDER — LORATADINE 10 MG PO TABS
10.0000 mg | ORAL_TABLET | Freq: Every day | ORAL | Status: DC
  Administered 2018-02-13 – 2018-02-14 (×2): 10 mg via ORAL
  Filled 2018-02-13 (×2): qty 1

## 2018-02-13 MED ORDER — LACTATED RINGERS IV SOLN
INTRAVENOUS | Status: AC
  Administered 2018-02-13: 16:00:00 via INTRAVENOUS

## 2018-02-13 MED ORDER — ONDANSETRON HCL 4 MG PO TABS: 4.0000 mg | ORAL_TABLET | Freq: Four times a day (QID) | ORAL | Status: DC | PRN

## 2018-02-13 MED ORDER — DONEPEZIL HCL 10 MG PO TABS
10.0000 mg | ORAL_TABLET | Freq: Every morning | ORAL | Status: DC
  Administered 2018-02-13 – 2018-02-14 (×2): 10 mg via ORAL
  Filled 2018-02-13 (×3): qty 1

## 2018-02-13 MED ORDER — PRAVASTATIN SODIUM 40 MG PO TABS
40.0000 mg | ORAL_TABLET | Freq: Every day | ORAL | Status: DC
  Administered 2018-02-13: 40 mg via ORAL
  Filled 2018-02-13: qty 1

## 2018-02-13 MED ORDER — ENOXAPARIN SODIUM 40 MG/0.4ML ~~LOC~~ SOLN
40.0000 mg | SUBCUTANEOUS | Status: DC
  Administered 2018-02-13: 40 mg via SUBCUTANEOUS
  Filled 2018-02-13 (×2): qty 0.4

## 2018-02-13 MED ORDER — MEMANTINE HCL 10 MG PO TABS
10.0000 mg | ORAL_TABLET | Freq: Two times a day (BID) | ORAL | Status: DC
  Administered 2018-02-13 – 2018-02-14 (×2): 10 mg via ORAL
  Filled 2018-02-13 (×3): qty 1

## 2018-02-13 MED ORDER — ASPIRIN EC 81 MG PO TBEC
81.0000 mg | DELAYED_RELEASE_TABLET | Freq: Every day | ORAL | Status: DC
  Administered 2018-02-13 – 2018-02-14 (×2): 81 mg via ORAL
  Filled 2018-02-13 (×2): qty 1

## 2018-02-13 MED ORDER — ALBUTEROL SULFATE (2.5 MG/3ML) 0.083% IN NEBU
5.0000 mg | INHALATION_SOLUTION | Freq: Once | RESPIRATORY_TRACT | Status: AC
  Administered 2018-02-13: 5 mg via RESPIRATORY_TRACT
  Filled 2018-02-13: qty 6

## 2018-02-13 NOTE — Care Management Note (Addendum)
Case Management Note  Patient Details  Name: Antonio Ray MRN: [id number] Date of Birth: 1933/11/28  Subjective/Objective:                    Action/Plan: Await PT/OT evals. Patient has home oxygen PRN. Will need ambulatory home oxygen qualifying saturation note completed in [facility] .     SATURATION QUALIFICATIONS: (This note is used to comply with regulatory documentation for home oxygen)  Patient Saturations on Room Air at Rest = %  Patient Saturations on Room Air while Ambulating = %  Patient Saturations on Liters of oxygen while Ambulating =%  Please briefly explain why patient needs home oxygen:   Expected Discharge Date:                  Expected Discharge Plan:     In-House Referral:     Discharge planning Services  CM Consult  Post Acute Care Choice:    Choice offered to:     DME Arranged:    DME Agency:     HH Arranged:    [place] Agency:     Status of Service:  In process, will continue to follow  If discussed at Long Length of Stay Meetings, dates discussed:    Additional Comments:  Marilu Favre, RN 02/13/2018, 3:46 PM

## 2018-02-13 NOTE — Progress Notes (Signed)
Received report from [facility], RN - Delay due Patient care on another Pt.

## 2018-02-13 NOTE — Social Work (Addendum)
CSW acknowledging consult for SNF placement. Pt currently admitted under "observation"- will not meet 3 night inpatient qualifying stay needed for SNF at the current time. If pt is medically necessary for inpatient status for care will support disposition.   Alexander Mt, [place] Work [phone]

## 2018-02-13 NOTE — ED Notes (Signed)
Attempted report x1. 

## 2018-02-13 NOTE — H&P (Signed)
History and Physical    Antonio Ray BSJ:[id number] DOB: [birth year] DOA: 02/13/2018  PCP: [name], MD Consultants:  Cruzita Lederer - endocrinology; [place] - cardiology; Silverio Decamp - GI; [place] - pulmonology Patient coming from:  Home - lives alone   Chief Complaint:  SOB, AMS  HPI: Antonio Ray is a 84 y.o. male with medical history significant of DM; CAD; dementia; HLD;  COPD (has home O2 for prn use); stage 3 CKD; AICD placement; chronic systolic CHF; and achalasia with recent esophageal dilation presenting with SOB and AMS.  He was sleeping on my entry into the room, in NAD.  When awakened, he was oriented to person only.  Due to his inability to provide history, history from EDP:  Antonio Ray is a 84 y.o. male hx of achalasia s/p recent dilation, AICD, CHF, CAD here presenting with altered mental status, cough. Patient lives at home by himself but his daughter comes to check on him very frequently. He has some coughing for the last several days as nonproductive. Denies any fevers. Patient also was noted to have some slight confusion this morning and has been forgetful. Is any trouble speaking or slurred speech or weakness or numbness. Patient is not currently on any steroids for his COPD. Daughter is concerned for COPD exacerbation. Of note, patient has a recent esophageal dilation and has not been gurgling or gagging on his food as much. Has hx of dementia    ED Course:  COPD exacerbation with hypoxia, demand ischemia.  Recent dilation for achalasia, has AICD.  Confusion at home with SOB.  Given nebs, steroids.  Desat into 76s with a couple of steps.  Suggest obs.  Review of Systems: Unable to assess   PMH, PSH, FH, and SH reviewed in Epic.   Past Medical History:  Diagnosis Date  . Achalasia   . AICD (automatic cardioverter/defibrillator) present 03/30/2011   Analyze ST study patient  . BPH (benign prostatic hyperplasia)   . Chronic systolic dysfunction of left ventricle    EF 30-35%, CLASS II - III SYMPTOMS; intolerant to Coreg  . CKD (chronic kidney disease) stage 3, GFR 30-59 ml/min ([name]) 06/08/2012  . COPD (chronic obstructive pulmonary disease) ([place])   . Coronary artery disease    History of remote anterior MI with PCI to LAD in 2006; most recent cath 2007, no intervention required  . DOE (dyspnea on exertion)    with heavy exertion  . Dysrhythmia   . Esophageal dysmotility   . GERD (gastroesophageal reflux disease)   . Head injury, closed, with concussion 2000ish  . Heart murmur    hx  . HOH (hard of hearing)   . Hyperlipidemia   . Memory loss    improved  . Pneumonia "several times"   aspiration pna with at least 3 admits for this 2016.   Marland Kitchen PVC's (premature ventricular contractions)   . Renal failure   . Skin cancer "several"   "forearms; head"  . Type II diabetes mellitus ([place])     Past Surgical History:  Procedure Laterality Date  . BALLOON DILATION N/A 09/09/2015   Procedure: BALLOON DILATION;  Surgeon: Mauri Pole, MD;  Location: [facility];  Service: Endoscopy;  Laterality: N/A;  rigiflex achalasia balloon dilators  . BOTOX INJECTION N/A 04/08/2015   Procedure: BOTOX INJECTION;  Surgeon: Milus Banister, MD;  Location: [facility];  Service: Endoscopy;  Laterality: N/A;  . CARDIAC CATHETERIZATION  01/11/2006   DEMONSTRATES AKINESIA OF THE DISTAL ANTERIOR  WALL, DISTAL INFERIOR WALL AND AKINESIA OF THE APEX. THE BASAL SEGMENTS CONTRACT WELL AND OVERALL EF 35%  . CATARACT EXTRACTION W/ INTRAOCULAR LENS  IMPLANT, BILATERAL  08/2014-09/2014  . CHOLECYSTECTOMY  2/11  . CORONARY ANGIOPLASTY  1994   TO THE LAD  . ESOPHAGEAL MANOMETRY N/A 03/16/2015   Procedure: ESOPHAGEAL MANOMETRY (EM);  Surgeon: Jerene Bears, MD;  Location: [facility];  Service: Gastroenterology;  Laterality: N/A;  . ESOPHAGOGASTRODUODENOSCOPY N/A 04/08/2015   Procedure: ESOPHAGOGASTRODUODENOSCOPY (EGD);  Surgeon: Milus Banister, MD;  Location: [facility];  Service:  Endoscopy;  Laterality: N/A;  . ESOPHAGOGASTRODUODENOSCOPY N/A 01/03/2018   Procedure: ESOPHAGOGASTRODUODENOSCOPY (EGD);  Surgeon: Mauri Pole, MD;  Location[facility];  Service: Endoscopy;  Laterality: N/A;  . ESOPHAGOGASTRODUODENOSCOPY (EGD) WITH PROPOFOL N/A 09/09/2015   Procedure: ESOPHAGOGASTRODUODENOSCOPY (EGD) WITH PROPOFOL;  Surgeon: Mauri Pole, MD;  Location: [facility];  Service: Endoscopy;  Laterality: N/A;  pt. is to have gastrografin xray post recovery-do not discharge until results are back  . EYE SURGERY    . FOOT FRACTURE SURGERY Right 1980's  . INSERT / REPLACE / REMOVE PACEMAKER    . SAVORY DILATION N/A 01/03/2018   Procedure: RIGIFLEX DILATION;  Surgeon: Mauri Pole, MD;  Location: [facility];  Service: Endoscopy;  Laterality: N/A;  . SKIN CANCER EXCISION  "several"   "forearms, head"    Social History   Socioeconomic History  . Marital status: Widowed    Spouse name: Not on file  . Number of children: 2  . Years of education: GED  . Highest education level: Not on file  Occupational History  . Occupation: Clinical biochemist: [employer]    Comment: Retired  . Occupation: [employer]    Employer: [employer]    Comment: Retired  Scientific laboratory technician  . Financial resource strain: Patient refused  . Food insecurity:    Worry: Never true    Inability: Never true  . Transportation needs:    Medical: No    Non-medical: No  Tobacco Use  . Smoking status: Former Smoker    Packs/day: 1.00    Years: 35.00    Pack years: 35.00    Types: Cigarettes    Last attempt to quit: 08/01/1992    Years since quitting: 25.5  . Smokeless tobacco: Former Systems developer    Quit date: 1994  Substance and Sexual Activity  . Alcohol use: No    Alcohol/week: 0.0 oz  . Drug use: No  . Sexual activity: Not Currently  Lifestyle  . Physical activity:    Days per week: Patient refused    Minutes per session: Patient refused  . Stress: Patient refused    Relationships  . Social connections:    Talks on phone: Not on file    Gets together: Not on file    Attends religious service: Not on file    Active member of club or organization: Not on file    Attends meetings of clubs or organizations: Not on file    Relationship status: Not on file  . Intimate partner violence:    Fear of current or ex partner: Not on file    Emotionally abused: Not on file    Physically abused: Not on file    Forced sexual activity: Not on file  Other Topics Concern  . Not on file  Social History Narrative   Pt lives in [place] alone.  Widowed.   Right-handed.   Rare caffeine  use - maybe one cup per month.    Allergies  Allergen Reactions  . Codeine Other (See Comments)    Gets very angry, disoriented  . Dilaudid [Hydromorphone Hcl] Other (See Comments)    VERY AGITATED, HOSTILE  . Flomax [Tamsulosin Hcl] Shortness Of Breath  . Morphine And Related Other (See Comments)    VERY AGITATED, HOSTILE  . Sulfa Antibiotics Shortness Of Breath  . Beta Adrenergic Blockers Other (See Comments)    Disorientation  . Carvedilol Other (See Comments)    DISORIENTATION    Family History  Problem Relation Age of Onset  . Stroke Father        Mother history unknown - never met her  . Venous thrombosis Sister   . Colon cancer Neg Hx     Prior to Admission medications   Medication Sig Start Date End Date Taking? Authorizing Provider  albuterol (PROVENTIL HFA;VENTOLIN HFA) 108 (90 Base) MCG/ACT inhaler Inhale 2 puffs into the lungs every 6 (six) hours as needed for wheezing or shortness of breath. 11/16/17  Yes Juanito Doom, MD  aspirin 81 MG EC tablet Take 81 mg by mouth daily.     Yes [provider]  Cholecalciferol (VITAMIN D) 2000 units tablet Take 2,000 Units by mouth daily.    Yes [provider]  donepezil (ARICEPT) 10 MG tablet Take 1 tablet (10 mg total) by mouth every morning. 05/01/17  Yes Marcial Pacas, MD  fenofibrate (TRICOR)  145 MG tablet TAKE 1 TABLET BY MOUTH EVERY DAY Patient taking differently: Take 145 mg by mouth once a day 02/10/17  Yes [place], Peter M, MD  finasteride (PROSCAR) 5 MG tablet Take 5 mg by mouth daily. 05/19/17  Yes [provider]  furosemide (LASIX) 20 MG tablet Take 1 tablet (20 mg total) by mouth daily. Take 2 tablets (40 mg daily) for 2 days, and then resume 1 tablet (20 mg) daily. 01/05/18  Yes Ghimire, Henreitta Leber, MD  insulin aspart (NOVOLOG) 100 UNIT/ML injection Inject 4-10 Units into the skin 3 (three) times daily before meals. Inject up to 6 units 3x a day before meals - pens please 02/05/18  Yes Philemon Kingdom, MD  Insulin Glargine (BASAGLAR KWIKPEN) 100 UNIT/ML SOPN INJECT  6 UNITS UNDER THE SKIN EVERY MORNING 02/05/18  Yes Philemon Kingdom, MD  loratadine (CLARITIN) 10 MG tablet Take 1 tablet (10 mg total) by mouth daily. 07/02/15  Yes Barton Dubois, MD  memantine (NAMENDA) 10 MG tablet Take 1 tablet (10 mg total) by mouth 2 (two) times daily. 05/01/17  Yes Marcial Pacas, MD  Multiple Vitamins-Minerals (PRESERVISION AREDS 2) CAPS Take 1 capsule by mouth 2 (two) times daily.   Yes [provider]  nitroGLYCERIN (NITRODUR - DOSED IN MG/24 HR) 0.2 mg/hr patch Place 1 patch (0.2 mg total) onto the skin daily. Alternates patch placement with odd/even days. Patient taking differently: Place 0.2 mg onto the skin daily.  11/22/17  Yes [place], Peter M, MD  pantoprazole (PROTONIX) 40 MG tablet Take 1 tablet (40 mg total) by mouth 2 (two) times daily. Patient taking differently: Take 40 mg by mouth daily.  10/16/17  Yes Regalado, Belkys A, MD  pravastatin (PRAVACHOL) 40 MG tablet Take 1 tablet (40 mg total) by mouth at bedtime. 05/26/17  Yes [place], Peter M, MD  SPIRIVA HANDIHALER 18 MCG inhalation capsule INHALE CONTENTS OF 1 CAPSULE VIA HANDIHALER ONCE DAILY 12/27/17  Yes Juanito Doom, MD  SYMBICORT 160-4.5 MCG/ACT inhaler INHALE 2 PUFFS  BY MOUTH TWICE DAILY Patient taking  differently: Inhale 2 puffs into the lungs two times a day 07/31/17  Yes Simonne Maffucci B, MD  glucose blood (FREESTYLE TEST STRIPS) test strip Use as instructed 3x a day 02/05/18   Philemon Kingdom, MD  Insulin Pen Needle 32G X 4 MM MISC Use 1x a day 02/05/18   Philemon Kingdom, MD    Physical Exam: Vitals:   02/13/18 1015 02/13/18 1030 02/13/18 1037 02/13/18 1045  BP:      Pulse: 79 76  77  Resp: _0 Temp:      TempSrc:      SpO2: 96% 97% 95% 100%     General:  Appears calm and comfortable and is NAD Eyes:  PERRL, EOMI, normal lids, iris ENT:  grossly normal hearing, lips & tongue, mmm Neck:  no LAD, masses or thyromegaly; no carotid bruits Cardiovascular:  RRR, no m/r/g. No LE edema.  Respiratory:   CTA bilaterally with scattered rhonchi.  Normal respiratory effort. Abdomen:  soft, NT, ND, NABS Back:   normal alignment, no CVAT Skin:  no rash or induration seen on limited exam Musculoskeletal:  grossly normal tone BUE/BLE, good ROM, no bony abnormality Lower extremity:  No LE edema.  Limited foot exam with no ulcerations.  2+ distal pulses. Psychiatric:  grossly normal mood and affect, speech fluent and appropriate, AOx1 - thinks he is in [place] and it is 1997 Neurologic:  Unable to assess    Radiological Exams on Admission: Dg Chest 2 View  Result Date: 02/13/2018 CLINICAL DATA:  Cough.  Shortness of breath. EXAM: CHEST - 2 VIEW COMPARISON:  CT 01/01/2018.  Chest x-ray 01/01/2018. FINDINGS: Cardiac pacer noted with lead tips over the right atrium right ventricle. Cardiomegaly with normal pulmonary vascularity. No focal infiltrate. Tiny bilateral pleural effusions. No pneumothorax. No acute bony abnormality. IMPRESSION: 1. Cardiac pacer with lead tip over the right atrium right ventricle. Cardiomegaly. No pulmonary venous congestion. 2.  No focal infiltrate.  Tiny bilateral pleural effusions. Electronically Signed   By: Marcello Moores  Register   On: 02/13/2018 09:35     EKG: Independently reviewed.  NSR with rate 87 with trigemiy; nonspecific ST changes with no evidence of acute ischemia   Labs on Admission: I have personally reviewed the available labs and imaging studies at the time of the admission.  Pertinent labs:   ABG: 7.410/42/68 UA unremarkable Troponin 0.10 Lactate 0.65 Blood cultures pending WBC 9.6 Hgb 12.0 Glucose 116 BUN 32/Creatinine 1.81/GFR 33; 12/1.35/47 in 6/19  Assessment/Plan Principal Problem:   Acute on chronic respiratory failure with hypoxia ([name]) Active Problems:   COPD (chronic obstructive pulmonary disease) ([name])   AICD (automatic cardioverter/defibrillator) present   Chronic kidney disease, stage III (moderate) ([name])   AKI (acute kidney injury) ([place])   Type 2 diabetes mellitus with stage 3 chronic kidney disease, with long-term current use of insulin ([name])   Chronic combined systolic and diastolic congestive heart failure ([name])   Memory loss   Acute on chronic respiratory failure with hypoxia -Patient with h/o COPD and CHF presenting with acute onset of SOB with hypoxia into the 80s when he tries to walk -He was given nebs and steroids -At the time of my evaluation, he appeared very comfortable on [place] O2 -While he may have had a small flare of COPD, he currently does not appear to have a significant exacerbation -Suspect that he needs chronic O2 rather than prn -He does not have fever or  leukocytosis.  -Chest x-ray is not consistent with pneumonia -Will observe patient -He is likely to need ongoing home O2 -Nebulizers: scheduled Duoneb and prn albuterol -Prednisone 40 mg daily x 5 days  COPD -He is on Spiriva, Symbicort, and Albuterol at home -Hold Spiriva and give Duonebs standing, continue Albuterol prn -Continue Symbicort (formulary substitution for [facility]) -He likely needs chronic home O2  CHF -3/19 echo with EF 20-25% and grade 2 diastolic dysfunction -He currently appears to be  compensated -Negative CXR -Will follow  AKI on CKD -Will give a 1-time gentle IVF run of 100cc/hr x 5 hours with plan for continued home Lasix as scheduled tomorrow AM -Recheck BMP in AM  DM -A1c 6.6 in 6/19 -Continue glargine -Cover with moderate-scale SSI  Dementia -Family appears to think that patient is worse than his baseline, but cognitive capacity with dementia can wax and wane -He is on both Aricept and Namenda and is oriented only to person -He may not be safe to resume independent living -Will follow with treatment as above    DVT prophylaxis: Lovenox  Code Status:  Full - as per prior admission, will need to discuss on an ongoing basis Family Communication: None present Disposition Plan:  Home once clinically improved or TBD Consults called: CM/SW/PT/OT/Nutrition/RT  Admission status: It is my clinical opinion that referral for OBSERVATION is reasonable and necessary in this patient based on the above information provided. The aforementioned taken together are felt to place the patient at high risk for further clinical deterioration. However it is anticipated that the patient may be medically stable for discharge from the hospital within 24 to 48 hours.    Karmen Bongo MD [facility]  If note is complete, please contact covering daytime or nighttime physician. [email or web address] Password [email or web address]  02/13/2018, 1:32 PM

## 2018-02-13 NOTE — Telephone Encounter (Signed)
Called and spoke with pt's son [name] who stated pt was taken via EMS to [facility] is currently still in the ED. Per [name], pt told EMS that he has been coughing x3 days.  [name] is unsure if pt will be admitted but he stated labwork is being performed and xrays as well.  Routing to BQ as an [place].

## 2018-02-13 NOTE — Telephone Encounter (Signed)
noted 

## 2018-02-13 NOTE — ED Provider Notes (Signed)
[facility] EMERGENCY DEPARTMENT Provider Note   CSN: [id number] Arrival date & time: 02/13/18  0818     History   Chief Complaint Chief Complaint  Patient presents with  . Altered Mental Status  . Shortness of Breath    HPI Antonio Ray is a 84 y.o. male hx of achalasia s/p recent dilation, AICD, CHF, CAD here presenting with altered mental status, cough.  Patient lives at home by himself but his daughter comes to check on him very frequently.  He has some coughing for the last several days as nonproductive.  Denies any fevers.  Patient also was noted to have some slight confusion this morning and has been forgetful.  Is any trouble speaking or slurred speech or weakness or numbness.  Patient is not currently on any steroids for his COPD.  Daughter is concerned for COPD exacerbation.  Of note, patient has a recent esophageal dilation and has not been gurgling or gagging on his food as much. Has hx of dementia   The history is provided by the patient and a relative.   Level V caveat- dementia   Past Medical History:  Diagnosis Date  . Achalasia   . AICD (automatic cardioverter/defibrillator) present 03/30/2011   Analyze ST study patient  . Anginal pain ([place])   . BPH (benign prostatic hyperplasia)   . CHF (congestive heart failure) ([place])   . Chronic systolic dysfunction of left ventricle    EF 30-35%, CLASS II - III SYMPTOMS; intolerant to Coreg  . CKD (chronic kidney disease) stage 3, GFR 30-59 ml/min ([name]) 06/08/2012  . COPD (chronic obstructive pulmonary disease) ([place])   . Coronary artery disease    History of remote anterior MI with PCI to LAD in 2006; most recent cath 2007, no intervention required  . DOE (dyspnea on exertion)    with heavy exertion  . Dysrhythmia   . Esophageal dysmotility   . GERD (gastroesophageal reflux disease)   . Head injury, closed, with concussion 2000ish  . Heart murmur    hx  . HOH (hard of hearing)   . Hyperlipidemia     . Memory loss    improved  . Myocardial infarction ([place]) 1994   ANTERIOR  . Pneumonia "several times"   aspiration pna with at least 3 admits for this 2016.   Marland Kitchen PVC's (premature ventricular contractions)   . Renal failure   . Skin cancer "several"   "forearms; head"  . Type II diabetes mellitus [facility])     Patient Active Problem List   Diagnosis Date Noted  . Aspiration pneumonia of both lower lobes due to gastric secretions ([place]) 01/02/2018  . SIRS (systemic inflammatory response syndrome) ([place]) 01/01/2018  . Generalized weakness 12/19/2017  . Hypoxemia   . Pneumonia, aspiration ([place]) 10/13/2017  . CAP (community acquired pneumonia) 08/19/2017  . Influenza A with pneumonia 08/19/2017  . Skin cancer   . Renal failure   . HOH (hard of hearing)   . Heart murmur   . Head injury, closed, with concussion   . Esophageal dysmotility   . Dysrhythmia   . DOE (dyspnea on exertion)   . Chronic systolic dysfunction of left ventricle   . CHF (congestive heart failure) ([place])   . Anginal pain ([place])   . Pneumonia 06/12/2017  . Acute UTI   . Altered mental state 02/15/2017  . Achalasia of esophagus   . Encephalopathy   . Fever chills   . SOB (shortness of breath)   .  Acute respiratory failure with hypoxemia ([place])   . Acute respiratory failure ([place]) 04/21/2016  . Chronic combined systolic and diastolic congestive heart failure ([place]) 04/21/2016  . Memory loss 04/21/2016  . Allergic rhinitis 04/11/2016  . Chronic systolic CHF (congestive heart failure) ([place]) 10/30/2015  . Mild cognitive impairment 08/18/2015  . Difficulty in swallowing   . AKI (acute kidney injury) ([place])   . Type 2 diabetes mellitus with stage 3 chronic kidney disease, with long-term current use of insulin ([place])   . Chronic systolic congestive heart failure ([place])   . Chronic kidney disease, stage III (moderate) ([name])   . Other specified hypotension   . CKD (chronic kidney disease), stage III ([place])   . Other  emphysema ([place])   . Systolic CHF, acute on chronic ([name])   . AICD (automatic cardioverter/defibrillator) present   . CAD S/P percutaneous coronary angioplasty   . Acute respiratory failure with hypoxia ([place]) 04/03/2015  . Cardiomyopathy, ischemic 04/03/2015  . Elevated troponin 04/03/2015  . Sepsis ([place]) 03/31/2015  . COPD exacerbation ([place])   . Aspiration into airway 03/21/2015  . Achalasia 03/21/2015  . Dysphagia   . Esophageal dysphagia   . Esophageal dysmotilities   . COPD with acute exacerbation ([place]) 01/14/2015  . Left ventricular apical thrombus 01/14/2015  . Acute on chronic systolic CHF (congestive heart failure) ([place]) 01/14/2015  . ARF (acute renal failure) ([place]) 01/14/2015  . Dysphagia, unspecified(787.20) 10/30/2013  . COPD GOLD IV 10/07/2013  . Leukocytosis 09/11/2013  . Acute encephalopathy 09/10/2013  . Renal failure (ARF), acute on chronic ([name]) 09/10/2013  . Automatic implantable cardioverter-defibrillator in situ 07/06/2012  . CKD (chronic kidney disease) stage 3, GFR 30-59 ml/min ([name]) 06/08/2012  . GERD (gastroesophageal reflux disease) 09/26/2011  . BPH (benign prostatic hyperplasia) 08/22/2011  . COPD (chronic obstructive pulmonary disease) ([place]) 07/17/2011  . S/P ICD (internal cardiac defibrillator) procedure 04/20/2011  . Coronary artery disease   . Sick sinus syndrome ([place])   . Acute on chronic combined systolic and diastolic congestive heart failure, NYHA class 3 ([place])   . Hyperlipidemia   . PVC's (premature ventricular contractions)   . Myocardial infarction [facility]) 08/01/1992    Past Surgical History:  Procedure Laterality Date  . BALLOON DILATION N/A 09/09/2015   Procedure: BALLOON DILATION;  Surgeon: Mauri Pole, MD;  Location: [facility] ENDOSCOPY;  Service: Endoscopy;  Laterality: N/A;  rigiflex achalasia balloon dilators  . BOTOX INJECTION N/A 04/08/2015   Procedure: BOTOX INJECTION;  Surgeon: Milus Banister, MD;  Location: [facility];  Service:  Endoscopy;  Laterality: N/A;  . CARDIAC CATHETERIZATION  01/11/2006   DEMONSTRATES AKINESIA OF THE DISTAL ANTERIOR WALL, DISTAL INFERIOR WALL AND AKINESIA OF THE APEX. THE BASAL SEGMENTS CONTRACT WELL AND OVERALL EF 35%  . CATARACT EXTRACTION W/ INTRAOCULAR LENS  IMPLANT, BILATERAL  08/2014-09/2014  . CHOLECYSTECTOMY  2/11  . CORONARY ANGIOPLASTY  1994   TO THE LAD  . ESOPHAGEAL MANOMETRY N/A 03/16/2015   Procedure: ESOPHAGEAL MANOMETRY (EM);  Surgeon: Jerene Bears, MD;  Location: [facility];  Service: Gastroenterology;  Laterality: N/A;  . ESOPHAGOGASTRODUODENOSCOPY N/A 04/08/2015   Procedure: ESOPHAGOGASTRODUODENOSCOPY (EGD);  Surgeon: Milus Banister, MD;  Location: [facility];  Service: Endoscopy;  Laterality: N/A;  . ESOPHAGOGASTRODUODENOSCOPY N/A 01/03/2018   Procedure: ESOPHAGOGASTRODUODENOSCOPY (EGD);  Surgeon: Mauri Pole, MD;  Location: [facility] ENDOSCOPY;  Service: Endoscopy;  Laterality: N/A;  . ESOPHAGOGASTRODUODENOSCOPY (EGD) WITH PROPOFOL N/A 09/09/2015   Procedure: ESOPHAGOGASTRODUODENOSCOPY (EGD) WITH PROPOFOL;  Surgeon: Harl Bowie  V, MD;  Location: [facility] ENDOSCOPY;  Service: Endoscopy;  Laterality: N/A;  pt. is to have gastrografin xray post recovery-do not discharge until results are back  . EYE SURGERY    . FOOT FRACTURE SURGERY Right 1980's  . INSERT / REPLACE / REMOVE PACEMAKER    . SAVORY DILATION N/A 01/03/2018   Procedure: RIGIFLEX DILATION;  Surgeon: Mauri Pole, MD;  Location: [facility] ENDOSCOPY;  Service: Endoscopy;  Laterality: N/A;  . SKIN CANCER EXCISION  "several"   "forearms, head"        Home Medications    Prior to Admission medications   Medication Sig Start Date End Date Taking? Authorizing Provider  albuterol (PROVENTIL HFA;VENTOLIN HFA) 108 (90 Base) MCG/ACT inhaler Inhale 2 puffs into the lungs every 6 (six) hours as needed for wheezing or shortness of breath. 11/16/17  Yes Juanito Doom, MD  aspirin 81 MG EC tablet Take 81 mg by mouth daily.      Yes [provider]  Cholecalciferol (VITAMIN D) 2000 units tablet Take 2,000 Units by mouth daily.    Yes [provider]  donepezil (ARICEPT) 10 MG tablet Take 1 tablet (10 mg total) by mouth every morning. 05/01/17  Yes Marcial Pacas, MD  fenofibrate (TRICOR) 145 MG tablet TAKE 1 TABLET BY MOUTH EVERY DAY Patient taking differently: Take 145 mg by mouth once a day 02/10/17  Yes [place], Peter M, MD  finasteride (PROSCAR) 5 MG tablet Take 5 mg by mouth daily. 05/19/17  Yes [provider]  furosemide (LASIX) 20 MG tablet Take 1 tablet (20 mg total) by mouth daily. Take 2 tablets (40 mg daily) for 2 days, and then resume 1 tablet (20 mg) daily. 01/05/18  Yes Ghimire, Henreitta Leber, MD  insulin aspart (NOVOLOG) 100 UNIT/ML injection Inject 4-10 Units into the skin 3 (three) times daily before meals. Inject up to 6 units 3x a day before meals - pens please 02/05/18  Yes Philemon Kingdom, MD  Insulin Glargine (BASAGLAR KWIKPEN) 100 UNIT/ML SOPN INJECT  6 UNITS UNDER THE SKIN EVERY MORNING 02/05/18  Yes Philemon Kingdom, MD  loratadine (CLARITIN) 10 MG tablet Take 1 tablet (10 mg total) by mouth daily. 07/02/15  Yes Barton Dubois, MD  memantine (NAMENDA) 10 MG tablet Take 1 tablet (10 mg total) by mouth 2 (two) times daily. 05/01/17  Yes Marcial Pacas, MD  Multiple Vitamins-Minerals (PRESERVISION AREDS 2) CAPS Take 1 capsule by mouth 2 (two) times daily.   Yes [provider]  nitroGLYCERIN (NITRODUR - DOSED IN MG/24 HR) 0.2 mg/hr patch Place 1 patch (0.2 mg total) onto the skin daily. Alternates patch placement with odd/even days. Patient taking differently: Place 0.2 mg onto the skin daily.  11/22/17  Yes [place], Peter M, MD  pantoprazole (PROTONIX) 40 MG tablet Take 1 tablet (40 mg total) by mouth 2 (two) times daily. Patient taking differently: Take 40 mg by mouth daily.  10/16/17  Yes Regalado, Belkys A, MD  pravastatin (PRAVACHOL) 40 MG tablet Take 1 tablet (40 mg total) by  mouth at bedtime. 05/26/17  Yes [place], Peter M, MD  SPIRIVA HANDIHALER 18 MCG inhalation capsule INHALE CONTENTS OF 1 CAPSULE VIA HANDIHALER ONCE DAILY 12/27/17  Yes Juanito Doom, MD  SYMBICORT 160-4.5 MCG/ACT inhaler INHALE 2 PUFFS BY MOUTH TWICE DAILY Patient taking differently: Inhale 2 puffs into the lungs two times a day 07/31/17  Yes McQuaid, Ronie Spies, MD  glucose blood (FREESTYLE TEST STRIPS) test strip Use as instructed 3x  a day 02/05/18   Philemon Kingdom, MD  Insulin Pen Needle 32G X 4 MM MISC Use 1x a day 02/05/18   Philemon Kingdom, MD    Family History Family History  Problem Relation Age of Onset  . Stroke Father        Mother history unknown - never met her  . Venous thrombosis Sister   . Colon cancer Neg Hx     Social History Social History   Tobacco Use  . Smoking status: Former Smoker    Packs/day: 1.00    Years: 35.00    Pack years: 35.00    Types: Cigarettes    Last attempt to quit: 08/01/1992    Years since quitting: 25.5  . Smokeless tobacco: Former Systems developer    Quit date: 1994  Substance Use Topics  . Alcohol use: No    Alcohol/week: 0.0 oz  . Drug use: No     Allergies   Codeine; Dilaudid [hydromorphone hcl]; Flomax [tamsulosin hcl]; Morphine and related; Sulfa antibiotics; Beta adrenergic blockers; and Carvedilol   Review of Systems Review of Systems  Respiratory: Positive for shortness of breath.   All other systems reviewed and are negative.    Physical Exam Updated Vital Signs BP 109/62   Pulse 77   Temp 98.7 F (37.1 C) (Rectal)   Resp 18   SpO2 100%   Physical Exam  Constitutional: He is oriented to person, place, and time.  Chronically ill, NAD   HENT:  Head: Normocephalic.  Mouth/Throat: Oropharynx is clear and moist.  Eyes: Pupils are equal, round, and reactive to light. EOM are normal.  Neck: Normal range of motion. Neck supple.  Cardiovascular: Normal rate.  Pulmonary/Chest: Effort normal.  Minimal wheezing  throughout, ? Crackles bilateral bases   Abdominal: Soft. Bowel sounds are normal.  Musculoskeletal: Normal range of motion.       Right lower leg: Normal.       Left lower leg: Normal.  1+ edema bilaterally (chronic)   Neurological: He is alert and oriented to person, place, and time. He is not disoriented. No cranial nerve deficit.  Skin: Skin is warm. Capillary refill takes less than 2 seconds.  Psychiatric: He has a normal mood and affect.  Nursing note and vitals reviewed.    ED Treatments / Results  Labs (all labs ordered are listed, but only abnormal results are displayed) Labs Reviewed  CBC WITH DIFFERENTIAL/PLATELET - Abnormal; Notable for the following components:      Result Value   Hemoglobin 12.0 (*)    MCHC 29.7 (*)    RDW 15.7 (*)    All other components within normal limits  COMPREHENSIVE METABOLIC PANEL - Abnormal; Notable for the following components:   Glucose, Bld 116 (*)    BUN 32 (*)    Creatinine, Ser 1.81 (*)    Albumin 3.3 (*)    Alkaline Phosphatase 36 (*)    GFR calc non Af Amer 33 (*)    GFR calc Af Amer 38 (*)    All other components within normal limits  I-STAT TROPONIN, ED - Abnormal; Notable for the following components:   Troponin i, poc 0.10 (*)    All other components within normal limits  I-STAT ARTERIAL BLOOD GAS, ED - Abnormal; Notable for the following components:   pO2, Arterial 68.0 (*)    All other components within normal limits  CULTURE, BLOOD (ROUTINE X 2)  CULTURE, BLOOD (ROUTINE X 2)  URINALYSIS, ROUTINE W REFLEX MICROSCOPIC  BLOOD GAS, ARTERIAL  I-STAT CG4 LACTIC ACID, ED    EKG EKG Interpretation  Date/Time:  Tuesday February 13 2018 08:51:03 EDT Ventricular Rate:  87 PR Interval:    QRS Duration: 167 QT Interval:  395 QTC Calculation: 476 R Axis:   -76 Text Interpretation:  Sinus rhythm Ventricular trigeminy Nonspecific IVCD with LAD Abnormal lateral Q waves Probable anteroseptal infarct, old Baseline wander in  lead(s) I II aVR No significant change since last tracing Confirmed by Wandra Arthurs [phone]) on 02/13/2018 9:06:35 AM   Radiology Dg Chest 2 View  Result Date: 02/13/2018 CLINICAL DATA:  Cough.  Shortness of breath. EXAM: CHEST - 2 VIEW COMPARISON:  CT 01/01/2018.  Chest x-ray 01/01/2018. FINDINGS: Cardiac pacer noted with lead tips over the right atrium right ventricle. Cardiomegaly with normal pulmonary vascularity. No focal infiltrate. Tiny bilateral pleural effusions. No pneumothorax. No acute bony abnormality. IMPRESSION: 1. Cardiac pacer with lead tip over the right atrium right ventricle. Cardiomegaly. No pulmonary venous congestion. 2.  No focal infiltrate.  Tiny bilateral pleural effusions. Electronically Signed   By: Marcello Moores  Register   On: 02/13/2018 09:35    Procedures Procedures (including critical care time)  CRITICAL CARE Performed by: Wandra Arthurs   Total critical care time: 30 minutes  Critical care time was exclusive of separately billable procedures and treating other patients.  Critical care was necessary to treat or prevent imminent or life-threatening deterioration.  Critical care was time spent personally by me on the following activities: development of treatment plan with patient and/or surrogate as well as nursing, discussions with consultants, evaluation of patient's response to treatment, examination of patient, obtaining history from patient or surrogate, ordering and performing treatments and interventions, ordering and review of laboratory studies, ordering and review of radiographic studies, pulse oximetry and re-evaluation of patient's condition.   Medications Ordered in ED Medications  albuterol (PROVENTIL) (2.5 MG/3ML) 0.083% nebulizer solution 5 mg (5 mg Nebulization Given 02/13/18 1034)  ipratropium (ATROVENT) nebulizer solution 0.5 mg (0.5 mg Nebulization Given 02/13/18 1034)  methylPREDNISolone sodium succinate (SOLU-MEDROL) 125 mg/2 mL injection 125 mg  (125 mg Intravenous Given 02/13/18 1052)     Initial Impression / Assessment and Plan / ED Course  I have reviewed the triage vital signs and the nursing notes.  Pertinent labs & imaging results that were available during my care of the patient were reviewed by me and considered in my medical decision making (see chart for details).     Antonio Ray is a 84 y.o. male here with cough, confusion. Likely COPD exacerbation vs recurrent pneumonia. nonfocal neuro exam currently and A & O x 3 currently. Will get labs, UA, CXR. Will give nebs and reassess.   12:52 PM Trop mildly positive at 0.1. Patient desat to 87% on RA when ambulating and became very short of breath. WBC nl, CXR showed no pneumonia. Will admit for COPD exacerbation with hypoxia.    Final Clinical Impressions(s) / ED Diagnoses   Final diagnoses:  None    ED Discharge Orders    None       Drenda Freeze, MD 02/13/18 1253

## 2018-02-13 NOTE — ED Notes (Signed)
Pt ambulated with assistance of 2 techs. Pt was okay standing but O2 dropped to 90. When walking he seemed a little shaky on his feet. Pt O2 dropped to 88 when walking around room. Pt denied feeling dizzy or weak. However pt stated a longer walk might give him some problems.

## 2018-02-13 NOTE — ED Triage Notes (Addendum)
Pt in from home via [facility], per report pt lives alone and his daughter reports he has been lethargic and confused x 3 days, pt A&O x4 in route and upon arrival to ED, pt reports new onset cough, NAD, pt has O2 at the house and does not use it per family

## 2018-02-14 DIAGNOSIS — I5042 Chronic combined systolic (congestive) and diastolic (congestive) heart failure: Secondary | ICD-10-CM

## 2018-02-14 DIAGNOSIS — Z95810 Presence of automatic (implantable) cardiac defibrillator: Secondary | ICD-10-CM

## 2018-02-14 DIAGNOSIS — J441 Chronic obstructive pulmonary disease with (acute) exacerbation: Secondary | ICD-10-CM | POA: Diagnosis not present

## 2018-02-14 DIAGNOSIS — E1122 Type 2 diabetes mellitus with diabetic chronic kidney disease: Secondary | ICD-10-CM

## 2018-02-14 DIAGNOSIS — J9621 Acute and chronic respiratory failure with hypoxia: Secondary | ICD-10-CM

## 2018-02-14 DIAGNOSIS — Z794 Long term (current) use of insulin: Secondary | ICD-10-CM | POA: Diagnosis not present

## 2018-02-14 DIAGNOSIS — N179 Acute kidney failure, unspecified: Secondary | ICD-10-CM | POA: Diagnosis not present

## 2018-02-14 DIAGNOSIS — N183 Chronic kidney disease, stage 3 (moderate): Secondary | ICD-10-CM | POA: Diagnosis not present

## 2018-02-14 LAB — BASIC METABOLIC PANEL WITH GFR
Anion gap: 7 (ref 5–15)
BUN: 29 mg/dL — ABNORMAL HIGH (ref 8–23)
CO2: 29 mmol/L (ref 22–32)
Calcium: 9 mg/dL (ref 8.9–10.3)
Chloride: 106 mmol/L (ref 98–111)
Creatinine, Ser: 1.57 mg/dL — ABNORMAL HIGH (ref 0.61–1.24)
GFR calc Af Amer: 46 mL/min — ABNORMAL LOW
GFR calc non Af Amer: 39 mL/min — ABNORMAL LOW
Glucose, Bld: 152 mg/dL — ABNORMAL HIGH (ref 70–99)
Potassium: 4.2 mmol/L (ref 3.5–5.1)
Sodium: 142 mmol/L (ref 135–145)

## 2018-02-14 LAB — CBC
HCT: 36.6 % — ABNORMAL LOW (ref 39.0–52.0)
Hemoglobin: 11.4 g/dL — ABNORMAL LOW (ref 13.0–17.0)
MCH: 28 pg (ref 26.0–34.0)
MCHC: 31.1 g/dL (ref 30.0–36.0)
MCV: 89.9 fL (ref 78.0–100.0)
Platelets: 167 K/uL (ref 150–400)
RBC: 4.07 MIL/uL — ABNORMAL LOW (ref 4.22–5.81)
RDW: 15.3 % (ref 11.5–15.5)
WBC: 9.4 K/uL (ref 4.0–10.5)

## 2018-02-14 LAB — GLUCOSE, CAPILLARY
Glucose-Capillary: 121 mg/dL — ABNORMAL HIGH (ref 70–99)
Glucose-Capillary: 174 mg/dL — ABNORMAL HIGH (ref 70–99)

## 2018-02-14 MED ORDER — PREDNISONE 20 MG PO TABS: 40.0000 mg | ORAL_TABLET | Freq: Every day | ORAL | 0 refills | Status: DC

## 2018-02-14 NOTE — Care Management Note (Signed)
Case Management Note  Patient Details  Name: JAQWON MANFRED MRN: [id number] Date of Birth: [birth year]  Subjective/Objective:                    Action/Plan:  Patient from home alone , son and daughter live close by. He has home oxygen through [facility] .   Await PT /ot eval and  home oxygen qualifying note . Patient currently sitting in chair on room air speaking full sentences no shortness of breath. Await oxygenation note .  Patient has rollator at home already.  Expected Discharge Date:                  Expected Discharge Plan:  [place]  In-House Referral:     Discharge planning Services  CM Consult  Post Acute Care Choice:    Choice offered to:  Patient  DME Arranged:    DME Agency:     HH Arranged:    [place] Agency:     Status of Service:  In process, will continue to follow  If discussed at Long Length of Stay Meetings, dates discussed:    Additional Comments:  Marilu Favre, RN 02/14/2018, 10:12 AM

## 2018-02-14 NOTE — Evaluation (Signed)
Physical Therapy Evaluation [name] MRN: [id number] DOB: 02/11/1934 Today's Date: 02/14/2018   History of Present Illness  Pt is an 84 y/o male with medical history significant of achalasia, recurrent aspiration pneumonitis, chronic systolic heart failure, COPD chronic kidney disease stage III, DM2, recurrent admissions for acute encephalopathy and SIRS, admitted for acute on chronic respiratory failure.    Clinical Impression  Pt presented standing in doorway with RN and son present upon arrival. Pt alert and willing to participate in therapy session. Prior to admission, pt reported that he was independent with ADLs and ambulated without an AD with the exception of a walking stick when outside. Pt currently able to perform transfers at modified independence, ambulate in hallway with min guard and ascend/descend stairs with min guard and unilateral handrail. Pt's son present and reported that pt appears to be back to his baseline. No further acute PT needs identified at this time. PT signing off.      Follow Up Recommendations No PT follow up    Equipment Recommendations  None recommended by PT    Recommendations for Other Services       Precautions / Restrictions Precautions Precautions: None Precaution Comments: no hx of falls Restrictions Weight Bearing Restrictions: No      Mobility  Bed Mobility               General bed mobility comments: pt standing in door way upon arrival with RN  Transfers Overall transfer level: Modified independent Equipment used: None             General transfer comment: stands up slowly, but steady  Ambulation/Gait Ambulation/Gait assistance: Min guard Gait Distance (Feet): 200 Feet Assistive device: None Gait Pattern/deviations: Step-through pattern;Decreased stride length;Drifts right/left Gait velocity: decreased Gait velocity interpretation: 1.31 - 2.62 ft/sec, indicative of limited community  ambulator General Gait Details: pt with mild instability but no overt LOB or need for physical assistance, min guard for safety, no AD used  Stairs Stairs: Yes Stairs assistance: Min guard Stair Management: One rail Right;Alternating pattern;Forwards Number of Stairs: 10 General stair comments: no instability or LOB, no need for physical assistance, pt managed to negotiate stairs with no issues with use of railing  Wheelchair Mobility    Modified Rankin (Stroke Patients Only)       Balance Overall balance assessment: Needs assistance Sitting-balance support: Feet supported Sitting balance-Leahy Scale: Good     Standing balance support: During functional activity;No upper extremity supported Standing balance-Leahy Scale: Fair                               Pertinent Vitals/Pain Pain Assessment: No/denies pain    Home Living Family/patient expects to be discharged to:: Private residence Living Arrangements: Alone Available Help at Discharge: Family;Available PRN/intermittently Type of Home: House Home Access: Stairs to enter Entrance Stairs-Rails: Right;Left Entrance Stairs-Number of Steps: 3 Home Layout: Two level;Able to live on main level with bedroom/bathroom;Full bath on main level Home Equipment: Cane - single point;Walker - 2 wheels;Walker - 4 wheels;Shower seat;Grab bars - tub/shower;Hand held shower head;Other (comment)(02) Additional Comments: he walks daily for exercise with a walking stick    Prior Function Level of Independence: Independent         Comments: uses walking stick when walking in the woods; drives; manages his CBG/insulin     Hand Dominance   Dominant Hand: Right    Extremity/Trunk Assessment  Upper Extremity Assessment Upper Extremity Assessment: Defer to OT evaluation    Lower Extremity Assessment Lower Extremity Assessment: Overall WFL for tasks assessed       Communication   Communication: HOH  Cognition  Arousal/Alertness: Awake/alert Behavior During Therapy: WFL for tasks assessed/performed Overall Cognitive Status: Within Functional Limits for tasks assessed                                        General Comments      Exercises     Assessment/Plan    PT Assessment Patent does not need any further PT services  PT Problem List         PT Treatment Interventions      PT Goals (Current goals can be found in the Care Plan section)  Acute Rehab PT Goals Patient Stated Goal: to go home    Frequency     Barriers to discharge        Co-evaluation               AM-PAC PT "6 Clicks" Daily Activity  Outcome Measure Difficulty turning over in bed (including adjusting bedclothes, sheets and blankets)?: None Difficulty moving from lying on back to sitting on the side of the bed? : None Difficulty sitting down on and standing up from a chair with arms (e.g., wheelchair, bedside commode, etc,.)?: None Help needed moving to and from a bed to chair (including a wheelchair)?: None Help needed walking in hospital room?: A Little Help needed climbing 3-5 steps with a railing? : A Little 6 Click Score: 22    End of Session Equipment Utilized During Treatment: Gait belt Activity Tolerance: Patient tolerated treatment well Patient left: in chair;with call bell/phone within reach;with chair alarm set;with family/visitor present Nurse Communication: Mobility status PT Visit Diagnosis: Other abnormalities of gait and mobility (R26.89)    Time: 0076-2263 PT Time Calculation (min) (ACUTE ONLY): 14 min   Charges:   PT Evaluation $PT Eval Low Complexity: 1 Low     PT G Codes:        [place], PT, [place] 02/14/2018, 2:51 PM

## 2018-02-14 NOTE — Evaluation (Signed)
Occupational Therapy Evaluation and Discharge [name] MRN: [id number] DOB: 02/14/1934 Today's Date: 02/14/2018    History of Present Illness 84 y.o. male with medical history significant of achalasia, recurrent aspiration pneumonitis, chronic systolic heart failure, COPD chronic kidney disease stage III, DM2, recurrent admissions for acute encephalopathy and SIRS, admitted for acute on chronic respiratory failure.   Clinical Impression   Pt is functioning modified independently in ADL. He does some furniture walking, but reports this is his normal habit. No OT needs.    Follow Up Recommendations  No OT follow up    Equipment Recommendations  None recommended by OT    Recommendations for Other Services       Precautions / Restrictions Precautions Precautions: None Precaution Comments: no hx of falls      Mobility Bed Mobility               General bed mobility comments: pt in chair  Transfers Overall transfer level: Modified independent               General transfer comment: stands up slowly, but steady    Balance Overall balance assessment: Needs assistance   Sitting balance-Leahy Scale: Good       Standing balance-Leahy Scale: Good                             ADL either performed or assessed with clinical judgement   ADL Overall ADL's : At baseline;Modified independent                                             Vision Baseline Vision/History: Wears glasses Wears Glasses: At all times Patient Visual Report: No change from baseline       Perception     Praxis      Pertinent Vitals/Pain Pain Assessment: No/denies pain     Hand Dominance Right   Extremity/Trunk Assessment Upper Extremity Assessment Upper Extremity Assessment: Overall WFL for tasks assessed(mild tremor)   Lower Extremity Assessment Lower Extremity Assessment: Defer to PT evaluation       Communication  Communication Communication: HOH   Cognition Arousal/Alertness: Awake/alert Behavior During Therapy: WFL for tasks assessed/performed Overall Cognitive Status: Within Functional Limits for tasks assessed                                     General Comments       Exercises     Shoulder Instructions      Home Living Family/patient expects to be discharged to:: Private residence Living Arrangements: Alone Available Help at Discharge: Family;Available PRN/intermittently Type of Home: House Home Access: Stairs to enter [employer] of Steps: 3 Entrance Stairs-Rails: Right;Left Home Layout: Two level;Able to live on main level with bedroom/bathroom;Full bath on main level Alternate Level Stairs-Number of Steps: flight Alternate Level Stairs-Rails: Right;Left Bathroom Shower/Tub: Occupational psychologist: Handicapped height     Home Equipment: [place] - single point;Walker - 2 wheels;Walker - 4 wheels;Shower seat;Grab bars - tub/shower;Hand held shower head;Other (comment)(02)   Additional Comments: he walks daily for exercise with a walking stick      Prior Functioning/Environment Level of Independence: Independent        Comments: uses walking  stick when walking in the woods; drives; manages his CBG/insulin        OT Problem List:        OT Treatment/Interventions:      OT Goals(Current goals can be found in the care plan section) Acute Rehab OT Goals Patient Stated Goal: to go home  OT Frequency:     Barriers to D/C:            Co-evaluation              AM-PAC PT "6 Clicks" Daily Activity     Outcome Measure Help from another person eating meals?: None Help from another person taking care of personal grooming?: None Help from another person toileting, which includes using toliet, bedpan, or urinal?: None Help from another person bathing (including washing, rinsing, drying)?: None Help from another person to put on  and taking off regular upper body clothing?: None Help from another person to put on and taking off regular lower body clothing?: None 6 Click Score: 24   End of Session Equipment Utilized During Treatment: Gait belt Nurse Communication: Mobility status  Activity Tolerance: Patient tolerated treatment well Patient left: in chair;with call bell/phone within reach  OT Visit Diagnosis: Muscle weakness (generalized) (M62.81)                Time: 5997-7414 OT Time Calculation (min): 13 min Charges:  OT General Charges $OT Visit: 1 Visit OT Evaluation $OT Eval Moderate Complexity: 1 Mod G-Codes:     Feb 22, 2018 Nestor Lewandowsky, OTR/L Pager: [place], [place] 2018/02/22, 12:22 PM

## 2018-02-14 NOTE — Progress Notes (Signed)
Discharge instructions reviewed with Pt and his son Pt discharged home, left via w/c with family to front

## 2018-02-14 NOTE — Care Management Obs Status (Signed)
[place] NOTIFICATION   Patient Details  Name: Antonio Ray MRN: [id number] Date of Birth: Nov 23, 1933   Medicare Observation Status Notification Given:       Marilu Favre, RN 02/14/2018, 10:10 AM

## 2018-02-14 NOTE — Progress Notes (Signed)
Pt sitting in chair on room air - 96% Walking in hall room air - 94% HR 92

## 2018-02-15 ENCOUNTER — Telehealth: Payer: Self-pay | Admitting: Pulmonary Disease

## 2018-02-15 NOTE — Telephone Encounter (Signed)
Called patients son, unable to reach left message to give us a call back. 

## 2018-02-15 NOTE — Telephone Encounter (Signed)
Attempted to contact pt's son, [name]. Received a fast busy signal x2. Will try back.

## 2018-02-15 NOTE — Discharge Summary (Signed)
Physician Discharge Summary  ROI Antonio Ray:[id number] DOB: 07-07-1934 DOA: 02/13/2018  PCP: [name], MD  Admit date: 02/13/2018 Discharge date: 02/14/2018  Admitted From: Home Discharge disposition: Home   Recommendations for Outpatient Follow-Up:   1. F/U with PCP 7-10 days to evaluate respiratory status.   Discharge Diagnosis:   Principal Problem:   Acute on chronic respiratory failure with hypoxia ([name]) Active Problems:   COPD (chronic obstructive pulmonary disease) ([name])   AICD (automatic cardioverter/defibrillator) present   Chronic kidney disease, stage III (moderate) ([name])   AKI (acute kidney injury) ([place])   Type 2 diabetes mellitus with stage 3 chronic kidney disease, with long-term current use of insulin ([name])   Chronic combined systolic and diastolic congestive heart failure ([place])   Memory loss  Discharge Condition: Improved.  Diet recommendation: Low sodium, heart healthy.  Carbohydrate-modified.  Regular.  Code status: Full.   History of Present Illness:    Antonio Ray is an 84 y.o. male with a PMH of diabetes, CAD, dimension, hyperlipidemia, COPD on home oxygen as needed, stage III CKD, status post AICD placement secondary to chronic systolic CHF, and alkylator with recent esophageal dilatation who was admitted 02/13/2018 for evaluation of shortness of breath associated with altered mental status.  Daughter reported that he had a cough prior to admission.  Hospital Course by Problem:   Principal Problem:   Acute on chronic respiratory failure with hypoxia ([name]) in the setting of COPD Given bronchodilators and steroids in the ED and placed on supplemental oxygen.  Chest x-ray unremarkable with no evidence of pneumonia.  No fever or leukocytosis.  Being treated for a mild COPD exacerbation with the thought that he will need continuous supplemental oxygen at discharge.  Continue duonebs and Symbicort.  Spiriva on hold. Resume at D/C.  Active  Problems:   Chronic combined systolic and diastolic CHF/AICD (automatic cardioverter/defibrillator) present On 3/19, echo showed EF 20-25% with grade 2 diastolic dysfunction.  No evidence of pulmonary edema on chest radiography.    Chronic kidney disease, stage III (moderate) ([name])/acute kidney injury Gently hydrated.  Creatinine improving.    Type 2 diabetes mellitus with stage 3 chronic kidney disease, with long-term current use of insulin ([name]) Hemoglobin A1c done in June shows good control of 6.6%. Resume pre-admission treatment.    Memory loss/dementia Continue Aricept/Namenda.  Medical Consultants:    None.   Discharge Exam:   Vitals:   02/14/18 1400 02/14/18 1430  BP: (!) 110/52   Pulse: 67   Resp: 16   Temp: 98.4 F (36.9 C)   SpO2: 98% 94%   Vitals:   02/14/18 0834 02/14/18 0838 02/14/18 1400 02/14/18 1430  BP:   (!) 110/52   Pulse:   67   Resp:   16   Temp:   98.4 F (36.9 C)   TempSrc:   Oral   SpO2: 95% 96% 98% 94%    General exam: Appears calm and comfortable. Sitting up in chair. Respiratory system: Clear to auscultation. Respiratory effort normal. Cardiovascular system: S1 & S2 heard, RRR. No JVD,  rubs, gallops or clicks. No murmurs. Gastrointestinal system: Abdomen is nondistended, soft and nontender. No organomegaly or masses felt. Normal bowel sounds heard. Central nervous system: Alert and oriented x 2. No focal neurological deficits. Extremities: No clubbing,  or cyanosis. 1+ edema. Skin: No rashes, lesions or ulcers. Psychiatry: Judgement and insight appear normal. Mood & affect appropriate.    The results of significant diagnostics from this  hospitalization (including imaging, microbiology, ancillary and laboratory) are listed below for reference.     Procedures and Diagnostic Studies:   Dg Chest 2 View  Result Date: 02/13/2018 CLINICAL DATA:  Cough.  Shortness of breath. EXAM: CHEST - 2 VIEW COMPARISON:  CT 01/01/2018.  Chest x-ray  01/01/2018. FINDINGS: Cardiac pacer noted with lead tips over the right atrium right ventricle. Cardiomegaly with normal pulmonary vascularity. No focal infiltrate. Tiny bilateral pleural effusions. No pneumothorax. No acute bony abnormality. IMPRESSION: 1. Cardiac pacer with lead tip over the right atrium right ventricle. Cardiomegaly. No pulmonary venous congestion. 2.  No focal infiltrate.  Tiny bilateral pleural effusions. Electronically Signed   By: Marcello Moores  Register   On: 02/13/2018 09:35     Labs:   Basic Metabolic Panel: Recent Labs  Lab 02/13/18 0841 02/14/18 0610  NA 140 142  K 4.2 4.2  CL 104 106  CO2 26 29  GLUCOSE 116* 152*  BUN 32* 29*  CREATININE 1.81* 1.57*  CALCIUM 9.2 9.0   GFR Estimated Creatinine Clearance: 35.1 mL/min (A) (by C-G formula based on SCr of 1.57 mg/dL (H)). Liver Function Tests: Recent Labs  Lab 02/13/18 0841  AST 26  ALT 17  ALKPHOS 36*  BILITOT 0.8  PROT 6.5  ALBUMIN 3.3*   CBC: Recent Labs  Lab 02/13/18 0841 02/14/18 0610  WBC 9.6 9.4  NEUTROABS 7.2  --   HGB 12.0* 11.4*  HCT 40.4 36.6*  MCV 92.4 89.9  PLT 186 167   CBG: Recent Labs  Lab 02/13/18 1743 02/13/18 2227 02/14/18 0757 02/14/18 1220  GLUCAP 267* 227* 121* 174*   Microbiology Recent Results (from the past 240 hour(s))  Blood culture (routine x 2)     Status: None (Preliminary result)   Collection Time: 02/13/18  9:03 AM  Result Value Ref Range Status   Specimen Description BLOOD SITE NOT SPECIFIED  Final   Special Requests   Final    BOTTLES DRAWN AEROBIC AND ANAEROBIC Blood Culture adequate volume   Culture   Final    NO GROWTH 1 DAY Performed at [facility], [place] [address] [place], [place] [address]    Report Status PENDING  Incomplete  Blood culture (routine x 2)     Status: None (Preliminary result)   Collection Time: 02/13/18  3:39 PM  Result Value Ref Range Status   Specimen Description BLOOD LEFT ANTECUBITAL  Final   Special Requests    Final    BOTTLES DRAWN AEROBIC ONLY Blood Culture adequate volume   Culture   Final    NO GROWTH < 24 HOURS Performed at [facility], [place] [address], [place], [place] [address]    Report Status PENDING  Incomplete     Discharge Instructions:   Discharge Instructions    (HEART FAILURE PATIENTS) Call MD:  Anytime you have any of the following symptoms: 1) 3 pound weight gain in 24 hours or 5 pounds in 1 week 2) shortness of breath, with or without a dry hacking cough 3) swelling in the hands, feet or stomach 4) if you have to sleep on extra pillows at night in order to breathe.   Complete by:  As directed    Call MD for:  difficulty breathing, headache or visual disturbances   Complete by:  As directed    Call MD for:  extreme fatigue   Complete by:  As directed    Call MD for:  severe uncontrolled pain  Complete by:  As directed    Diet - low sodium heart healthy   Complete by:  As directed    Diet Carb Modified   Complete by:  As directed    Increase activity slowly   Complete by:  As directed      Allergies as of 02/14/2018      Reactions   Codeine Other (See Comments)   Gets very angry, disoriented   Dilaudid [hydromorphone Hcl] Other (See Comments)   VERY AGITATED, HOSTILE   Flomax [tamsulosin Hcl] Shortness Of Breath   Morphine And Related Other (See Comments)   VERY AGITATED, HOSTILE   Sulfa Antibiotics Shortness Of Breath   Beta Adrenergic Blockers Other (See Comments)   Disorientation   Carvedilol Other (See Comments)   DISORIENTATION      Medication List    TAKE these medications   albuterol 108 (90 Base) MCG/ACT inhaler Commonly known as:  PROVENTIL HFA;VENTOLIN HFA Inhale 2 puffs into the lungs every 6 (six) hours as needed for wheezing or shortness of breath.   aspirin 81 MG EC tablet Take 81 mg by mouth daily.   BASAGLAR KWIKPEN 100 UNIT/ML Sopn INJECT  6 UNITS UNDER THE SKIN EVERY MORNING   donepezil 10 MG tablet Commonly known as:   ARICEPT Take 1 tablet (10 mg total) by mouth every morning.   fenofibrate 145 MG tablet Commonly known as:  TRICOR TAKE 1 TABLET BY MOUTH EVERY DAY What changed:    how much to take  how to take this  when to take this   finasteride 5 MG tablet Commonly known as:  PROSCAR Take 5 mg by mouth daily.   furosemide 20 MG tablet Commonly known as:  LASIX Take 1 tablet (20 mg total) by mouth daily. Take 2 tablets (40 mg daily) for 2 days, and then resume 1 tablet (20 mg) daily.   glucose blood test strip Commonly known as:  FREESTYLE TEST STRIPS Use as instructed 3x a day   insulin aspart 100 UNIT/ML injection Commonly known as:  novoLOG Inject 4-10 Units into the skin 3 (three) times daily before meals. Inject up to 6 units 3x a day before meals - pens please   Insulin Pen Needle 32G X 4 MM Misc Use 1x a day   loratadine 10 MG tablet Commonly known as:  CLARITIN Take 1 tablet (10 mg total) by mouth daily.   memantine 10 MG tablet Commonly known as:  NAMENDA Take 1 tablet (10 mg total) by mouth 2 (two) times daily.   nitroGLYCERIN 0.2 mg/hr patch Commonly known as:  NITRODUR - Dosed in mg/24 hr Place 1 patch (0.2 mg total) onto the skin daily. Alternates patch placement with odd/even days. What changed:  additional instructions   pantoprazole 40 MG tablet Commonly known as:  PROTONIX Take 1 tablet (40 mg total) by mouth 2 (two) times daily. What changed:  when to take this   pravastatin 40 MG tablet Commonly known as:  PRAVACHOL Take 1 tablet (40 mg total) by mouth at bedtime.   predniSONE 20 MG tablet Commonly known as:  DELTASONE Take 2 tablets (40 mg total) by mouth daily with breakfast.   PRESERVISION AREDS 2 Caps Take 1 capsule by mouth 2 (two) times daily.   SPIRIVA HANDIHALER 18 MCG inhalation capsule Generic drug:  tiotropium INHALE CONTENTS OF 1 CAPSULE VIA HANDIHALER ONCE DAILY   SYMBICORT 160-4.5 MCG/ACT inhaler Generic drug:   budesonide-formoterol INHALE 2 PUFFS BY MOUTH TWICE DAILY  What changed:    how much to take  how to take this  when to take this   Vitamin D 2000 units tablet Take 2,000 Units by mouth daily.      Follow-up Information    [name], MD. Schedule an appointment as soon as possible for a visit in 1 week(s).   Specialty:  Family Medicine Why:  Hospital follow up. Contact information: [place] [address] [phone]        [place], Peter M, MD .   Specialty:  Cardiology Contact information: [address] [place] [address] [phone]            Time coordinating discharge: 30 minutes.  SignedMargreta Journey Catherene Kaleta  Pager [phone] [facility] 02/15/2018, 10:56 AM

## 2018-02-15 NOTE — Telephone Encounter (Signed)
Patients son called back and stated that the patient was discharged from the hospital on 7.17.19 after having a COPD flare up. Patient did fine yesterday afternoon and then this morning he woke up very SOB. Patient used rescue inhaler as well as nebulizer and was fine. Patients son is wanting to know if there are other precautions they can do to stop flare ups in the future. Patient has appointment on 7.25.19 with TP.  TP please advise, thank you.

## 2018-02-15 NOTE — Telephone Encounter (Signed)
Patient son [name] called back - he can be reached at [phone]

## 2018-02-16 NOTE — Telephone Encounter (Signed)
Patient d/c from hospital 7/17.  He wants BQ to be aware that the Patient was sent home with prednisone taper until Sunday.  Pt complaining of SHOB, placed on 2L [place], sats were ok.  He has been using his rescue inhaler.  Today, sats are staying up, but he is concerned because the past 2 mornings he has been [facility], but the rescue inhaler helps. [name] stated that they are going to see TP next Thursday. He is doing IS, breathing exercises.   Will route to Dr. Lake Bells

## 2018-02-16 NOTE — Telephone Encounter (Signed)
Pt's son [name] is calling to update BQ on his status   [phone]

## 2018-02-18 LAB — CULTURE, BLOOD (ROUTINE X 2)
CULTURE: NO GROWTH
Culture: NO GROWTH
Special Requests: ADEQUATE
Special Requests: ADEQUATE

## 2018-02-19 NOTE — Telephone Encounter (Signed)
If they are concerned about his breathing then offer an OV sooner than Thursday.   Please extend prednisone 15mg  daily x 3 days in the meantime.

## 2018-02-19 NOTE — Telephone Encounter (Signed)
lmtcb for son [name]

## 2018-02-20 NOTE — Telephone Encounter (Signed)
Called and spoke with pt's son Antonio Ray regarding how pt id feeling since Hospital discharge Antonio Ray states pt SOB is better, and feeling better overall They are keeping appt with BQ for Thursday and no prednisone is needed at this time per Antonio Ray Nothing further needed.

## 2018-02-21 ENCOUNTER — Telehealth: Payer: Self-pay | Admitting: Pulmonary Disease

## 2018-02-21 ENCOUNTER — Other Ambulatory Visit: Payer: Self-pay | Admitting: Pulmonary Disease

## 2018-02-21 ENCOUNTER — Other Ambulatory Visit: Payer: Self-pay | Admitting: Cardiology

## 2018-02-21 NOTE — Telephone Encounter (Signed)
noted 

## 2018-02-21 NOTE — Telephone Encounter (Signed)
Received call from Amy at [facility]  She states [facility] called them with pos AFB on sputum collected 01/03/18 by hospital doc  Will forward to Dr Lake Bells to make him aware

## 2018-02-21 NOTE — Telephone Encounter (Signed)
Rx request sent to pharmacy.  

## 2018-02-22 ENCOUNTER — Ambulatory Visit (INDEPENDENT_AMBULATORY_CARE_PROVIDER_SITE_OTHER): Payer: Medicare Other | Admitting: Adult Health

## 2018-02-22 ENCOUNTER — Encounter: Payer: Self-pay | Admitting: Adult Health

## 2018-02-22 VITALS — BP 118/80 | HR 75 | Wt 170.8 lb

## 2018-02-22 DIAGNOSIS — J441 Chronic obstructive pulmonary disease with (acute) exacerbation: Secondary | ICD-10-CM

## 2018-02-22 DIAGNOSIS — I255 Ischemic cardiomyopathy: Secondary | ICD-10-CM | POA: Diagnosis not present

## 2018-02-22 DIAGNOSIS — I5042 Chronic combined systolic (congestive) and diastolic (congestive) heart failure: Secondary | ICD-10-CM | POA: Diagnosis not present

## 2018-02-22 DIAGNOSIS — J690 Pneumonitis due to inhalation of food and vomit: Secondary | ICD-10-CM

## 2018-02-22 DIAGNOSIS — J9611 Chronic respiratory failure with hypoxia: Secondary | ICD-10-CM | POA: Insufficient documentation

## 2018-02-22 DIAGNOSIS — J449 Chronic obstructive pulmonary disease, unspecified: Secondary | ICD-10-CM

## 2018-02-22 NOTE — Assessment & Plan Note (Signed)
O2 for O2 sats >90%  Check ONO on room air , if desats will need to wear O2 At bedtime  .

## 2018-02-22 NOTE — Assessment & Plan Note (Signed)
Prone to recurrent aspiration pneumonia secondary to achalasia.  Patient is clinically improved after esophageal dilatation on January 03, 2018.  We will continue to monitor closely.  Patient is continue on aspiration precautions.  Follow-up with GI as planned

## 2018-02-22 NOTE — Patient Instructions (Signed)
Set up for Overnight oximetry test on room air .  Continue on Symbicort and Spiriva .  Continue on current regimen.  Aspiration precautions .  Follow up with  Dr. Lake Bells in 6-8 weeks  and As needed   Please contact office for sooner follow up if symptoms do not improve or worsen or seek emergency care

## 2018-02-22 NOTE — Progress Notes (Signed)
@Patient  ID: Antonio Ray, male    DOB: 1933-09-13, 84 y.o.   MRN: [id number]  Chief Complaint  Patient presents with  . Follow-up    COPD    Referring provider: [name], MD  HPI: 84 year old male former smoker followed for very severe COPD complicated by recurrent aspiration pneumonia due to achalasia. S/P  Botox injections in the distal esophagus late 2016 PMH significant for CHF , AICD , CKD   TEST  09/2017 Echo EF 20 to 32%, grade 2 diastolic dysfunction Endo 01/03/18 > severely dilated lumen of the esophagus, moderate amount of fluid in the entire esophagus status post dilatation 30 mm pneumatic balloon. CT chest January 01, 2018 right-sided pneumonia, diffuse distention of the esophagus  02/22/2018 Follow up ; COPD , Aspiration PNA , Achalasia  Patient returns for a post hospital follow-up.  Patient was recently hospitalized last week for a mild COPD exacerbation.  He was treated with bronchodilators and steroids.  Patient has underlying congestive heart failure was felt to be euvolemic.  Chest x-ray showed no sign of pneumonia.  Patient has had multiple hospitalizations over the last year with recurrent aspiration pneumonia secondary to achalasia.  He did undergo an endoscopy with dilatation January 03, 2018.  Patient and son say that since then his swallowing has been significantly improved.  His appetite has increased and his ability to eat is much improved.  He had no episodes of choking since this esophageal dilatation.  The most recent hospitalization last week was felt to be secondary to the hot temperatures and poor air quality outside.  Patient had had been outside due to some health issues and felt this aggravated his breathing. Patient says he tries to be active.  He goes out and walks most each day.  O2 saturations at home have been remaining above 90% on room air.  He has not needed his oxygen. He did notice that he had a couple episodes where he felt a little more short of  breath he took an albuterol which he rarely uses in the seem to help substantially.  He has finished all prednisone since discharge.Marland Kitchen He remains on Symbicort and Spiriva. CT chest in early June did show a pneumonia.  Follow-up chest x-ray on July 16 showed no evidence of pneumonia.  Sputum culture in June was negative.  Sputum AFB phone report yesterday noted positive AFB.  Patient denies any hemoptysis fever or weight loss. Patient says he has oxygen at home.  He does not wear it has noticed O2 saturations have been above 90%.  Patient does not wear his oxygen at nighttime..        Allergies  Allergen Reactions  . Codeine Other (See Comments)    Gets very angry, disoriented  . Dilaudid [Hydromorphone Hcl] Other (See Comments)    VERY AGITATED, HOSTILE  . Flomax [Tamsulosin Hcl] Shortness Of Breath  . Morphine And Related Other (See Comments)    VERY AGITATED, HOSTILE  . Sulfa Antibiotics Shortness Of Breath  . Beta Adrenergic Blockers Other (See Comments)    Disorientation  . Carvedilol Other (See Comments)    DISORIENTATION    Immunization History  Administered Date(s) Administered  . Influenza Split 05/09/2013, 06/16/2015  . Influenza, High Dose Seasonal PF 06/04/2014, 07/02/2015, 05/17/2017  . Influenza, Seasonal, Injecte, Preservative Fre 04/11/2016  . Influenza,inj,Quad PF,6+ Mos 04/11/2016  . Pneumococcal Conjugate-13 12/12/2014  . Pneumococcal Polysaccharide-23 12/22/2008  . Tdap 12/22/2008, 03/20/2013    Past Medical History:  Diagnosis Date  . Achalasia   . AICD (automatic cardioverter/defibrillator) present 03/30/2011   Analyze ST study patient  . BPH (benign prostatic hyperplasia)   . Chronic systolic dysfunction of left ventricle    EF 30-35%, CLASS II - III SYMPTOMS; intolerant to Coreg  . CKD (chronic kidney disease) stage 3, GFR 30-59 ml/min ([name]) 06/08/2012  . COPD (chronic obstructive pulmonary disease) ([place])   . Coronary artery disease    History of  remote anterior MI with PCI to LAD in 2006; most recent cath 2007, no intervention required  . DOE (dyspnea on exertion)    with heavy exertion  . Dyspnea   . Dysrhythmia   . Esophageal dysmotility   . GERD (gastroesophageal reflux disease)   . Head injury, closed, with concussion 2000ish  . Heart murmur    hx  . HOH (hard of hearing)   . Hyperlipidemia   . Memory loss    improved  . Pneumonia "several times"   aspiration pna with at least 3 admits for this 2016.   Marland Kitchen PVC's (premature ventricular contractions)   . Renal failure   . Skin cancer "several"   "forearms; head"  . Type II diabetes mellitus ([name])     Tobacco History: Social History   Tobacco Use  Smoking Status Former Smoker  . Packs/day: 1.00  . Years: 35.00  . Pack years: 35.00  . Types: Cigarettes  . Last attempt to quit: 08/01/1992  . Years since quitting: 25.5  Smokeless Tobacco Former Systems developer  . Quit date: 1994   Counseling given: Not Answered   Outpatient Medications Prior to Visit  Medication Sig Dispense Refill  . albuterol (PROVENTIL HFA;VENTOLIN HFA) 108 (90 Base) MCG/ACT inhaler Inhale 2 puffs into the lungs every 6 (six) hours as needed for wheezing or shortness of breath. 1 Inhaler 2  . aspirin 81 MG EC tablet Take 81 mg by mouth daily.      . Cholecalciferol (VITAMIN D) 2000 units tablet Take 2,000 Units by mouth daily.     Marland Kitchen donepezil (ARICEPT) 10 MG tablet Take 1 tablet (10 mg total) by mouth every morning. 90 tablet 4  . fenofibrate (TRICOR) 145 MG tablet Take 145 mg by mouth once a day 90 tablet 0  . finasteride (PROSCAR) 5 MG tablet Take 5 mg by mouth daily.  0  . furosemide (LASIX) 20 MG tablet Take 1 tablet (20 mg total) by mouth daily. Take 2 tablets (40 mg daily) for 2 days, and then resume 1 tablet (20 mg) daily. 10 tablet 0  . glucose blood (FREESTYLE TEST STRIPS) test strip Use as instructed 3x a day 200 each 3  . insulin aspart (NOVOLOG) 100 UNIT/ML injection Inject 4-10 Units into the  skin 3 (three) times daily before meals. Inject up to 6 units 3x a day before meals - pens please 15 mL 5  . Insulin Glargine (BASAGLAR KWIKPEN) 100 UNIT/ML SOPN INJECT  6 UNITS UNDER THE SKIN EVERY MORNING 15 mL 0  . Insulin Pen Needle 32G X 4 MM MISC Use 1x a day 200 each 3  . loratadine (CLARITIN) 10 MG tablet Take 1 tablet (10 mg total) by mouth daily. 30 tablet 1  . memantine (NAMENDA) 10 MG tablet Take 1 tablet (10 mg total) by mouth 2 (two) times daily. 180 tablet 4  . Multiple Vitamins-Minerals (PRESERVISION AREDS 2) CAPS Take 1 capsule by mouth 2 (two) times daily.    . nitroGLYCERIN (NITRODUR - DOSED IN MG/24  HR) 0.2 mg/hr patch Place 1 patch (0.2 mg total) onto the skin daily. Alternates patch placement with odd/even days. (Patient taking differently: Place 0.2 mg onto the skin daily. ) 30 patch 6  . pantoprazole (PROTONIX) 40 MG tablet Take 1 tablet (40 mg total) by mouth 2 (two) times daily. (Patient taking differently: Take 40 mg by mouth daily. ) 60 tablet 0  . pravastatin (PRAVACHOL) 40 MG tablet Take 1 tablet (40 mg total) by mouth at bedtime. 90 tablet 3  . SPIRIVA HANDIHALER 18 MCG inhalation capsule INHALE CONTENTS OF 1 CAPSULE VIA HANDIHALER ONCE DAILY 30 capsule 11  . SYMBICORT 160-4.5 MCG/ACT inhaler INHALE 2 PUFFS BY MOUTH TWICE DAILY 10.2 g 5  . predniSONE (DELTASONE) 20 MG tablet Take 2 tablets (40 mg total) by mouth daily with breakfast. (Patient not taking: Reported on 02/22/2018) 8 tablet 0   No facility-administered medications prior to visit.      Review of Systems  Constitutional:   No  weight loss, night sweats,  Fevers, chills,  +fatigue, or  lassitude.  HEENT:   No headaches,  Difficulty swallowing,  Tooth/dental problems, or  Sore throat,                No sneezing, itching, ear ache,  +nasal congestion, post nasal drip,   CV:  No chest pain,  Orthopnea, PND, +swelling in lower extremities, anasarca, dizziness, palpitations, syncope.   GI  No heartburn,  indigestion, abdominal pain, nausea, vomiting, diarrhea, change in bowel habits, loss of appetite, bloody stools.   Resp: .  No chest wall deformity  Skin: no rash or lesions.  GU: no dysuria, change in color of urine, no urgency or frequency.  No flank pain, no hematuria   MS:  No joint pain or swelling.  No decreased range of motion.  No back pain.    Physical Exam  BP 118/80 (BP Location: Left Arm, Cuff Size: Normal)   Pulse 75   Wt 170 lb 12.8 oz (77.5 kg)   SpO2 96%   BMI 25.97 kg/m   GEN: A/Ox3; pleasant , NAD, elderly   HEENT:  [place]/AT,  EACs-clear, TMs-wnl, NOSE-clear, THROAT-clear, no lesions, no postnasal drip or exudate noted.   NECK:  Supple w/ fair ROM; no JVD; normal carotid impulses w/o bruits; no thyromegaly or nodules palpated; no lymphadenopathy.    RESP decreased breath sounds in the bases  no accessory muscle use, no dullness to percussion  CARD:  RRR, no m/r/g, trace  peripheral edema, pulses intact, no cyanosis or clubbing.  GI:   Soft & nt; nml bowel sounds; no organomegaly or masses detected.   Musco: Warm bil, no deformities or joint swelling noted.   Neuro: alert, no focal deficits noted.    Skin: Warm, no lesions or rashes    Lab Results:  CBC  BMET  BNP   Imaging: Dg Chest 2 View  Result Date: 02/13/2018 CLINICAL DATA:  Cough.  Shortness of breath. EXAM: CHEST - 2 VIEW COMPARISON:  CT 01/01/2018.  Chest x-ray 01/01/2018. FINDINGS: Cardiac pacer noted with lead tips over the right atrium right ventricle. Cardiomegaly with normal pulmonary vascularity. No focal infiltrate. Tiny bilateral pleural effusions. No pneumothorax. No acute bony abnormality. IMPRESSION: 1. Cardiac pacer with lead tip over the right atrium right ventricle. Cardiomegaly. No pulmonary venous congestion. 2.  No focal infiltrate.  Tiny bilateral pleural effusions. Electronically Signed   By: Marcello Moores  Register   On: 02/13/2018 09:35  Assessment & Plan:   COPD with  acute exacerbation ([place]) Recent exacerbation now improving no further abx/steroids at this time  1 AFB culture + , will repeat sputum AFB today    Plan  Patient Instructions  Set up for Overnight oximetry test on room air .  Continue on Symbicort and Spiriva .  Continue on current regimen.  Aspiration precautions .  Follow up with  Dr. Lake Bells in 6-8 weeks  and As needed   Please contact office for sooner follow up if symptoms do not improve or worsen or seek emergency care       Aspiration pneumonia of both lower lobes due to gastric secretions ([name]) Prone to recurrent aspiration pneumonia secondary to achalasia.  Patient is clinically improved after esophageal dilatation on January 03, 2018.  We will continue to monitor closely.  Patient is continue on aspiration precautions.  Follow-up with GI as planned  Chronic combined systolic and diastolic congestive heart failure ([name]) Appears  euvolemic on exam.  No evidence of fluid overload. Continue on current regimen.  Follow-up with cardiology as planned  Chronic respiratory failure with hypoxia ([place]) O2 for O2 sats >90%  Check ONO on room air , if desats will need to wear O2 At bedtime  .      Rexene Edison, NP 02/22/2018

## 2018-02-22 NOTE — Addendum Note (Signed)
Addended by: Lorane Gell on: 02/22/2018 02:48 PM   Modules accepted: Orders

## 2018-02-22 NOTE — Progress Notes (Signed)
Reviewed, agree 

## 2018-02-22 NOTE — Assessment & Plan Note (Addendum)
Recent exacerbation now improving no further abx/steroids at this time  1 AFB culture + , will repeat sputum AFB today    Plan  Patient Instructions  Set up for Overnight oximetry test on room air .  Continue on Symbicort and Spiriva .  Continue on current regimen.  Aspiration precautions .  Follow up with  Dr. Lake Bells in 6-8 weeks  and As needed   Please contact office for sooner follow up if symptoms do not improve or worsen or seek emergency care

## 2018-02-22 NOTE — Assessment & Plan Note (Addendum)
Appears  euvolemic on exam.  No evidence of fluid overload. Continue on current regimen.  Follow-up with cardiology as planned

## 2018-02-26 ENCOUNTER — Ambulatory Visit (INDEPENDENT_AMBULATORY_CARE_PROVIDER_SITE_OTHER): Payer: Medicare Other | Admitting: *Deleted

## 2018-02-26 DIAGNOSIS — N401 Enlarged prostate with lower urinary tract symptoms: Secondary | ICD-10-CM | POA: Diagnosis not present

## 2018-02-26 DIAGNOSIS — I255 Ischemic cardiomyopathy: Secondary | ICD-10-CM

## 2018-02-26 DIAGNOSIS — R350 Frequency of micturition: Secondary | ICD-10-CM | POA: Diagnosis not present

## 2018-02-26 DIAGNOSIS — R351 Nocturia: Secondary | ICD-10-CM | POA: Diagnosis not present

## 2018-02-26 NOTE — Progress Notes (Signed)
Remote ICD transmission.   

## 2018-02-27 ENCOUNTER — Encounter: Payer: Self-pay | Admitting: Cardiology

## 2018-03-01 DIAGNOSIS — R0902 Hypoxemia: Secondary | ICD-10-CM | POA: Diagnosis not present

## 2018-03-01 DIAGNOSIS — J449 Chronic obstructive pulmonary disease, unspecified: Secondary | ICD-10-CM | POA: Diagnosis not present

## 2018-03-02 ENCOUNTER — Encounter: Payer: Self-pay | Admitting: Adult Health

## 2018-03-14 ENCOUNTER — Telehealth: Payer: Self-pay | Admitting: Adult Health

## 2018-03-14 LAB — ACID FAST CULTURE WITH REFLEXED SENSITIVITIES (MYCOBACTERIA)

## 2018-03-14 LAB — SLOW GROWER BROTH SUSCEP.
Ciprofloxacin: 1
Clarithromycin: 0.25
Linezolid: 16
Moxifloxacin: 0.5

## 2018-03-14 LAB — ACID FAST CULTURE WITH REFLEXED SENSITIVITIES: ACID FAST CULTURE - AFSCU3: POSITIVE — AB

## 2018-03-14 LAB — AFB ORGANISM ID BY DNA PROBE
M AVIUM COMPLEX: NEGATIVE
M TUBERCULOSIS COMPLEX: NEGATIVE

## 2018-03-14 LAB — ORG ID BY SEQUENCING RFLX AST

## 2018-03-14 NOTE — Telephone Encounter (Signed)
Patient seen 7.25.19 by TP and ONO on room air ordered Results received and placed in TP's lookat - will route message to TP

## 2018-03-15 NOTE — Telephone Encounter (Signed)
Results reviewed by TP: Minimal desats.  No O2 needed at this time.

## 2018-03-15 NOTE — Telephone Encounter (Signed)
Called spoke with patient and advised of ONO results/recs as stated by TP. Pt voiced his understanding and denied any questions/concerns at this time. ONO sent for scan. Nothing further needed; will sign off.

## 2018-03-22 ENCOUNTER — Other Ambulatory Visit: Payer: Self-pay | Admitting: Cardiology

## 2018-03-22 DIAGNOSIS — H5201 Hypermetropia, right eye: Secondary | ICD-10-CM | POA: Diagnosis not present

## 2018-03-22 DIAGNOSIS — Z961 Presence of intraocular lens: Secondary | ICD-10-CM | POA: Diagnosis not present

## 2018-03-22 DIAGNOSIS — E119 Type 2 diabetes mellitus without complications: Secondary | ICD-10-CM | POA: Diagnosis not present

## 2018-03-22 DIAGNOSIS — H5212 Myopia, left eye: Secondary | ICD-10-CM | POA: Diagnosis not present

## 2018-03-22 DIAGNOSIS — H524 Presbyopia: Secondary | ICD-10-CM | POA: Diagnosis not present

## 2018-03-22 DIAGNOSIS — H52223 Regular astigmatism, bilateral: Secondary | ICD-10-CM | POA: Diagnosis not present

## 2018-03-23 ENCOUNTER — Encounter: Payer: Self-pay | Admitting: Gastroenterology

## 2018-03-23 ENCOUNTER — Ambulatory Visit (INDEPENDENT_AMBULATORY_CARE_PROVIDER_SITE_OTHER): Payer: Medicare Other | Admitting: Gastroenterology

## 2018-03-23 VITALS — BP 102/52 | HR 64 | Ht 67.0 in | Wt 177.1 lb

## 2018-03-23 DIAGNOSIS — I255 Ischemic cardiomyopathy: Secondary | ICD-10-CM | POA: Diagnosis not present

## 2018-03-23 DIAGNOSIS — K220 Achalasia of cardia: Secondary | ICD-10-CM | POA: Diagnosis not present

## 2018-03-23 NOTE — Progress Notes (Signed)
Antonio Ray    [id number]    04/07/1934  Primary Care Physician:Briscoe, Jannifer Rodney, MD  Referring Physician: [name], MD [address] [place], [place] [address]  Chief complaint:  Achalasia  HPI: 84 year old male with history of severe COPD, CHF with EF 20 to 36% and diastolic dysfunction, achalasia here for follow-up visit EGD with pneumatic 30 mm balloon dilation February 2017.  He was doing well for about a year and subsequently started having recurrent episodes of aspiration pneumonia requiring multiple hospitalizations. Repeat EGD January 03, 2018 showed dilated esophagus, fluid-filled.  Pneumatic balloon dilation performed with 30 mm balloon.  He is doing well overall since esophageal dilation, no longer having episodes of regurgitation and swallowing is better.  He has not had any hospitalizations with aspiration pneumonia in the past 2 months.  He was admitted with COPD exacerbation February 13, 2018 it.  Sputum AFB culture positive when it was collected during hospitalization but currently has no cough or sputum production.  Denies any fever or chills.  His appetite is back to normal and is gaining weight slowly.  Outpatient Encounter Medications as of 03/23/2018  Medication Sig  . albuterol (PROVENTIL HFA;VENTOLIN HFA) 108 (90 Base) MCG/ACT inhaler Inhale 2 puffs into the lungs every 6 (six) hours as needed for wheezing or shortness of breath.  Marland Kitchen aspirin 81 MG EC tablet Take 81 mg by mouth daily.    . Cholecalciferol (VITAMIN D) 2000 units tablet Take 2,000 Units by mouth daily.   Marland Kitchen donepezil (ARICEPT) 10 MG tablet Take 1 tablet (10 mg total) by mouth every morning.  . fenofibrate (TRICOR) 145 MG tablet Take 145 mg by mouth once a day  . finasteride (PROSCAR) 5 MG tablet Take 5 mg by mouth daily.  . furosemide (LASIX) 20 MG tablet Take 1 tablet (20 mg total) by mouth daily. Take 2 tablets (40 mg daily) for 2 days, and then resume 1 tablet (20 mg)  daily.  . furosemide (LASIX) 20 MG tablet TAKE 2 TABLETS(40 MG) BY MOUTH DAILY  . glucose blood (FREESTYLE TEST STRIPS) test strip Use as instructed 3x a day  . insulin aspart (NOVOLOG) 100 UNIT/ML injection Inject 4-10 Units into the skin 3 (three) times daily before meals. Inject up to 6 units 3x a day before meals - pens please  . Insulin Glargine (BASAGLAR KWIKPEN) 100 UNIT/ML SOPN INJECT  6 UNITS UNDER THE SKIN EVERY MORNING  . Insulin Pen Needle 32G X 4 MM MISC Use 1x a day  . loratadine (CLARITIN) 10 MG tablet Take 1 tablet (10 mg total) by mouth daily.  . memantine (NAMENDA) 10 MG tablet Take 1 tablet (10 mg total) by mouth 2 (two) times daily.  . Multiple Vitamins-Minerals (PRESERVISION AREDS 2) CAPS Take 1 capsule by mouth 2 (two) times daily.  . nitroGLYCERIN (NITRODUR - DOSED IN MG/24 HR) 0.2 mg/hr patch Place 1 patch (0.2 mg total) onto the skin daily. Alternates patch placement with odd/even days. (Patient taking differently: Place 0.2 mg onto the skin daily. )  . pantoprazole (PROTONIX) 40 MG tablet Take 1 tablet (40 mg total) by mouth 2 (two) times daily. (Patient taking differently: Take 40 mg by mouth daily. )  . pravastatin (PRAVACHOL) 40 MG tablet Take 1 tablet (40 mg total) by mouth at bedtime.  Marland Kitchen SPIRIVA HANDIHALER 18 MCG inhalation capsule INHALE CONTENTS OF 1 CAPSULE VIA HANDIHALER ONCE DAILY  . SYMBICORT  160-4.5 MCG/ACT inhaler INHALE 2 PUFFS BY MOUTH TWICE DAILY   No facility-administered encounter medications on file as of 03/23/2018.     Allergies as of 03/23/2018 - Review Complete 03/23/2018  Allergen Reaction Noted  . Codeine Other (See Comments)   . Dilaudid [hydromorphone hcl] Other (See Comments) 09/10/2013  . Flomax [tamsulosin hcl] Shortness Of Breath 05/19/2012  . Morphine and related Other (See Comments) 05/20/2011  . Sulfa antibiotics Shortness Of Breath 05/23/2012  . Beta adrenergic blockers Other (See Comments) 03/31/2015  . Carvedilol Other (See  Comments) 04/18/2011    Past Medical History:  Diagnosis Date  . Achalasia   . AICD (automatic cardioverter/defibrillator) present 03/30/2011   Analyze ST study patient  . BPH (benign prostatic hyperplasia)   . Chronic systolic dysfunction of left ventricle    EF 30-35%, CLASS II - III SYMPTOMS; intolerant to Coreg  . CKD (chronic kidney disease) stage 3, GFR 30-59 ml/min ([name]) 06/08/2012  . COPD (chronic obstructive pulmonary disease) ([place])   . Coronary artery disease    History of remote anterior MI with PCI to LAD in 2006; most recent cath 2007, no intervention required  . DOE (dyspnea on exertion)    with heavy exertion  . Dyspnea   . Dysrhythmia   . Esophageal dysmotility   . GERD (gastroesophageal reflux disease)   . Head injury, closed, with concussion 2000ish  . Heart murmur    hx  . HOH (hard of hearing)   . Hyperlipidemia   . Memory loss    improved  . Pneumonia "several times"   aspiration pna with at least 3 admits for this 2016.   Marland Kitchen PVC's (premature ventricular contractions)   . Renal failure   . Skin cancer "several"   "forearms; head"  . Type II diabetes mellitus ([place])     Past Surgical History:  Procedure Laterality Date  . BALLOON DILATION N/A 09/09/2015   Procedure: BALLOON DILATION;  Surgeon: Mauri Pole, MD;  Location: [facility];  Service: Endoscopy;  Laterality: N/A;  rigiflex achalasia balloon dilators  . BOTOX INJECTION N/A 04/08/2015   Procedure: BOTOX INJECTION;  Surgeon: Milus Banister, MD;  Location: [facility];  Service: Endoscopy;  Laterality: N/A;  . CARDIAC CATHETERIZATION  01/11/2006   DEMONSTRATES AKINESIA OF THE DISTAL ANTERIOR WALL, DISTAL INFERIOR WALL AND AKINESIA OF THE APEX. THE BASAL SEGMENTS CONTRACT WELL AND OVERALL EF 35%  . CATARACT EXTRACTION W/ INTRAOCULAR LENS  IMPLANT, BILATERAL  08/2014-09/2014  . CHOLECYSTECTOMY  2/11  . CORONARY ANGIOPLASTY  1994   TO THE LAD  . ESOPHAGEAL MANOMETRY N/A 03/16/2015   Procedure:  ESOPHAGEAL MANOMETRY (EM);  Surgeon: Jerene Bears, MD;  Location: [facility];  Service: Gastroenterology;  Laterality: N/A;  . ESOPHAGOGASTRODUODENOSCOPY N/A 04/08/2015   Procedure: ESOPHAGOGASTRODUODENOSCOPY (EGD);  Surgeon: Milus Banister, MD;  Location: [facility];  Service: Endoscopy;  Laterality: N/A;  . ESOPHAGOGASTRODUODENOSCOPY N/A 01/03/2018   Procedure: ESOPHAGOGASTRODUODENOSCOPY (EGD);  Surgeon: Mauri Pole, MD;  Location: [facility];  Service: Endoscopy;  Laterality: N/A;  . ESOPHAGOGASTRODUODENOSCOPY (EGD) WITH PROPOFOL N/A 09/09/2015   Procedure: ESOPHAGOGASTRODUODENOSCOPY (EGD) WITH PROPOFOL;  Surgeon: Mauri Pole, MD;  Location: [facility];  Service: Endoscopy;  Laterality: N/A;  pt. is to have gastrografin xray post recovery-do not discharge until results are back  . EYE SURGERY    . FOOT FRACTURE SURGERY Right 1980's  . INSERT / REPLACE / REMOVE PACEMAKER    . SAVORY DILATION N/A 01/03/2018   Procedure:  RIGIFLEX DILATION;  Surgeon: Mauri Pole, MD;  Location: [facility];  Service: Endoscopy;  Laterality: N/A;  . SKIN CANCER EXCISION  "several"   "forearms, head"    Family History  Problem Relation Age of Onset  . Stroke Father        Mother history unknown - never met her  . Venous thrombosis Sister   . Colon cancer Neg Hx     Social History   Socioeconomic History  . Marital status: Widowed    Spouse name: Not on file  . Number of children: 2  . Years of education: GED  . Highest education level: Not on file  Occupational History  . Occupation: Clinical biochemist: [employer]    Comment: Retired  . Occupation: [employer]    Employer: [employer]    Comment: Retired  Scientific laboratory technician  . Financial resource strain: Patient refused  . Food insecurity:    Worry: Never true    Inability: Never true  . Transportation needs:    Medical: No    Non-medical: No  Tobacco Use  . Smoking status: Former Smoker    Packs/day:  1.00    Years: 35.00    Pack years: 35.00    Types: Cigarettes    Last attempt to quit: 08/01/1992    Years since quitting: 25.6  . Smokeless tobacco: Former Systems developer    Quit date: 1994  Substance and Sexual Activity  . Alcohol use: No    Alcohol/week: 0.0 standard drinks  . Drug use: No  . Sexual activity: Not Currently  Lifestyle  . Physical activity:    Days per week: Patient refused    Minutes per session: Patient refused  . Stress: Patient refused  Relationships  . Social connections:    Talks on phone: Not on file    Gets together: Not on file    Attends religious service: Not on file    Active member of club or organization: Not on file    Attends meetings of clubs or organizations: Not on file    Relationship status: Not on file  . Intimate partner violence:    Fear of current or ex partner: Not on file    Emotionally abused: Not on file    Physically abused: Not on file    Forced sexual activity: Not on file  Other Topics Concern  . Not on file  Social History Narrative   Pt lives in [place] alone.  Widowed.   Right-handed.   Rare caffeine use - maybe one cup per month.      Review of systems: Review of Systems  Constitutional: Negative for fever and chills.  HENT: Negative.   Eyes: Negative for blurred vision.  Respiratory: Negative for cough, shortness of breath and wheezing.   Cardiovascular: Negative for chest pain and palpitations.  Gastrointestinal: as per HPI Genitourinary: Negative for dysuria, urgency, frequency and hematuria.  Musculoskeletal: Negative for myalgias, back pain and joint pain.  Skin: Negative for itching and rash.  Neurological: Negative for dizziness, tremors, focal weakness, seizures and loss of consciousness.  Endo/Heme/Allergies: Positive for seasonal allergies.  Psychiatric/Behavioral: Negative for depression, suicidal ideas and hallucinations.  All other systems reviewed and are negative.   Physical Exam: Vitals:    03/23/18 1337  BP: (!) 102/52   Body mass index is 27.74 kg/m. Gen:      No acute distress HEENT:  EOMI, sclera anicteric Lungs:   normal respiratory effort, bilateral  basilar crackles CV:         Regular rate and rhythm; no murmurs Abd:      soft, non-tender; no distension Ext:    +edema; adequate peripheral perfusion Skin:      Warm and dry; no rash Neuro: alert and oriented x 3 Psych: normal mood and affect  Data Reviewed:  Reviewed labs, radiology imaging, old records and pertinent past GI work up   Assessment and Plan/Recommendations:  84 year old male with severe CHF, diastolic dysfunction, severe COPD on oxygen at bedtime, achalasia status post pneumatic balloon dilation in February 2017 and June 2019 Overall GI symptoms stable, swallowing is better status post esophageal dilation Continue dietary and lifestyle changes Continue antireflux measures He follows with Dr. Lake Bells routinely and has a follow-up appointment within the next few weeks Return as needed and call with any change in symptoms  15 minutes was spent face-to-face with the patient. Greater than 50% of the time used for counseling as well as treatment plan and follow-up. She had multiple questions which were answered to her satisfaction  K. Denzil Magnuson , MD [phone]    CC: [name], MD

## 2018-03-23 NOTE — Patient Instructions (Addendum)
Follow up as needed  If you are age 82 or older, your body mass index should be between 23-30. Your Body mass index is 27.74 kg/m. If this is out of the aforementioned range listed, please consider follow up with your Primary Care Provider.  If you are age 7 or younger, your body mass index should be between 19-25. Your Body mass index is 27.74 kg/m. If this is out of the aformentioned range listed, please consider follow up with your Primary Care Provider.    Thank you for choosing [facility]  Karleen Hampshire Nand[place]am,MD

## 2018-03-28 ENCOUNTER — Other Ambulatory Visit: Payer: Medicare Other

## 2018-03-28 DIAGNOSIS — J449 Chronic obstructive pulmonary disease, unspecified: Secondary | ICD-10-CM | POA: Diagnosis not present

## 2018-03-28 DIAGNOSIS — J690 Pneumonitis due to inhalation of food and vomit: Secondary | ICD-10-CM

## 2018-03-29 MED ORDER — AMOXICILLIN-POT CLAVULANATE 875-125 MG PO TABS: 1.0000 | ORAL_TABLET | Freq: Two times a day (BID) | ORAL | 0 refills | Status: DC

## 2018-03-29 NOTE — Telephone Encounter (Signed)
Called son [name], made aware of recs.  Rx sent to pharmacy.  Nothing further needed.

## 2018-04-05 ENCOUNTER — Emergency Department (HOSPITAL_COMMUNITY): Payer: Medicare Other

## 2018-04-05 ENCOUNTER — Other Ambulatory Visit: Payer: Self-pay

## 2018-04-05 ENCOUNTER — Encounter (HOSPITAL_COMMUNITY): Payer: Self-pay | Admitting: Emergency Medicine

## 2018-04-05 ENCOUNTER — Inpatient Hospital Stay (HOSPITAL_COMMUNITY)
Admission: EM | Admit: 2018-04-05 | Discharge: 2018-04-09 | DRG: 177 | Disposition: A | Discharge status: Home Health | Payer: Medicare Other | Attending: Internal Medicine | Admitting: Internal Medicine

## 2018-04-05 ENCOUNTER — Inpatient Hospital Stay (HOSPITAL_COMMUNITY): Payer: Medicare Other

## 2018-04-05 DIAGNOSIS — D649 Anemia, unspecified: Secondary | ICD-10-CM | POA: Diagnosis present

## 2018-04-05 DIAGNOSIS — R0602 Shortness of breath: Secondary | ICD-10-CM | POA: Diagnosis not present

## 2018-04-05 DIAGNOSIS — K7682 Hepatic encephalopathy: Secondary | ICD-10-CM

## 2018-04-05 DIAGNOSIS — Z794 Long term (current) use of insulin: Secondary | ICD-10-CM

## 2018-04-05 DIAGNOSIS — G9340 Encephalopathy, unspecified: Secondary | ICD-10-CM

## 2018-04-05 DIAGNOSIS — R4182 Altered mental status, unspecified: Secondary | ICD-10-CM | POA: Diagnosis present

## 2018-04-05 DIAGNOSIS — Z7951 Long term (current) use of inhaled steroids: Secondary | ICD-10-CM

## 2018-04-05 DIAGNOSIS — H9190 Unspecified hearing loss, unspecified ear: Secondary | ICD-10-CM | POA: Diagnosis present

## 2018-04-05 DIAGNOSIS — K224 Dyskinesia of esophagus: Secondary | ICD-10-CM | POA: Diagnosis present

## 2018-04-05 DIAGNOSIS — K7200 Acute and subacute hepatic failure without coma: Secondary | ICD-10-CM

## 2018-04-05 DIAGNOSIS — R0902 Hypoxemia: Secondary | ICD-10-CM | POA: Diagnosis not present

## 2018-04-05 DIAGNOSIS — J449 Chronic obstructive pulmonary disease, unspecified: Secondary | ICD-10-CM | POA: Diagnosis present

## 2018-04-05 DIAGNOSIS — Z885 Allergy status to narcotic agent status: Secondary | ICD-10-CM

## 2018-04-05 DIAGNOSIS — N400 Enlarged prostate without lower urinary tract symptoms: Secondary | ICD-10-CM | POA: Diagnosis present

## 2018-04-05 DIAGNOSIS — F0390 Unspecified dementia without behavioral disturbance: Secondary | ICD-10-CM | POA: Diagnosis present

## 2018-04-05 DIAGNOSIS — J690 Pneumonitis due to inhalation of food and vomit: Principal | ICD-10-CM | POA: Diagnosis present

## 2018-04-05 DIAGNOSIS — Z7982 Long term (current) use of aspirin: Secondary | ICD-10-CM

## 2018-04-05 DIAGNOSIS — I255 Ischemic cardiomyopathy: Secondary | ICD-10-CM | POA: Diagnosis present

## 2018-04-05 DIAGNOSIS — N183 Chronic kidney disease, stage 3 (moderate): Secondary | ICD-10-CM | POA: Diagnosis present

## 2018-04-05 DIAGNOSIS — Z7189 Other specified counseling: Secondary | ICD-10-CM | POA: Diagnosis not present

## 2018-04-05 DIAGNOSIS — Z8701 Personal history of pneumonia (recurrent): Secondary | ICD-10-CM

## 2018-04-05 DIAGNOSIS — Z9981 Dependence on supplemental oxygen: Secondary | ICD-10-CM

## 2018-04-05 DIAGNOSIS — E1165 Type 2 diabetes mellitus with hyperglycemia: Secondary | ICD-10-CM | POA: Diagnosis not present

## 2018-04-05 DIAGNOSIS — G92 Toxic encephalopathy: Secondary | ICD-10-CM | POA: Diagnosis present

## 2018-04-05 DIAGNOSIS — I5043 Acute on chronic combined systolic (congestive) and diastolic (congestive) heart failure: Secondary | ICD-10-CM | POA: Diagnosis not present

## 2018-04-05 DIAGNOSIS — Z85828 Personal history of other malignant neoplasm of skin: Secondary | ICD-10-CM | POA: Diagnosis not present

## 2018-04-05 DIAGNOSIS — J90 Pleural effusion, not elsewhere classified: Secondary | ICD-10-CM | POA: Diagnosis not present

## 2018-04-05 DIAGNOSIS — I2510 Atherosclerotic heart disease of native coronary artery without angina pectoris: Secondary | ICD-10-CM | POA: Diagnosis present

## 2018-04-05 DIAGNOSIS — J9621 Acute and chronic respiratory failure with hypoxia: Secondary | ICD-10-CM | POA: Diagnosis present

## 2018-04-05 DIAGNOSIS — R42 Dizziness and giddiness: Secondary | ICD-10-CM | POA: Diagnosis not present

## 2018-04-05 DIAGNOSIS — D631 Anemia in chronic kidney disease: Secondary | ICD-10-CM | POA: Diagnosis present

## 2018-04-05 DIAGNOSIS — Z515 Encounter for palliative care: Secondary | ICD-10-CM | POA: Diagnosis present

## 2018-04-05 DIAGNOSIS — E785 Hyperlipidemia, unspecified: Secondary | ICD-10-CM | POA: Diagnosis present

## 2018-04-05 DIAGNOSIS — Z888 Allergy status to other drugs, medicaments and biological substances status: Secondary | ICD-10-CM | POA: Diagnosis not present

## 2018-04-05 DIAGNOSIS — K219 Gastro-esophageal reflux disease without esophagitis: Secondary | ICD-10-CM | POA: Diagnosis present

## 2018-04-05 DIAGNOSIS — N179 Acute kidney failure, unspecified: Secondary | ICD-10-CM | POA: Diagnosis not present

## 2018-04-05 DIAGNOSIS — I5023 Acute on chronic systolic (congestive) heart failure: Secondary | ICD-10-CM

## 2018-04-05 DIAGNOSIS — Z882 Allergy status to sulfonamides status: Secondary | ICD-10-CM

## 2018-04-05 DIAGNOSIS — Z95810 Presence of automatic (implantable) cardiac defibrillator: Secondary | ICD-10-CM

## 2018-04-05 DIAGNOSIS — E1122 Type 2 diabetes mellitus with diabetic chronic kidney disease: Secondary | ICD-10-CM | POA: Diagnosis present

## 2018-04-05 DIAGNOSIS — Z79899 Other long term (current) drug therapy: Secondary | ICD-10-CM

## 2018-04-05 DIAGNOSIS — J9811 Atelectasis: Secondary | ICD-10-CM | POA: Diagnosis not present

## 2018-04-05 DIAGNOSIS — Z87891 Personal history of nicotine dependence: Secondary | ICD-10-CM | POA: Diagnosis not present

## 2018-04-05 DIAGNOSIS — N189 Chronic kidney disease, unspecified: Secondary | ICD-10-CM | POA: Diagnosis not present

## 2018-04-05 LAB — I-STAT TROPONIN, ED: Troponin i, poc: 0.06 ng/mL (ref 0.00–0.08)

## 2018-04-05 LAB — CBC WITH DIFFERENTIAL/PLATELET
ABS IMMATURE GRANULOCYTES: 0 10*3/uL (ref 0.0–0.1)
Basophils Absolute: 0 10*3/uL (ref 0.0–0.1)
Basophils Relative: 1 %
Eosinophils Absolute: 0.2 10*3/uL (ref 0.0–0.7)
Eosinophils Relative: 2 %
HCT: 38.5 % — ABNORMAL LOW (ref 39.0–52.0)
HEMOGLOBIN: 11.5 g/dL — AB (ref 13.0–17.0)
IMMATURE GRANULOCYTES: 0 %
LYMPHS ABS: 1.4 10*3/uL (ref 0.7–4.0)
LYMPHS PCT: 16 %
MCH: 27.6 pg (ref 26.0–34.0)
MCHC: 29.9 g/dL — ABNORMAL LOW (ref 30.0–36.0)
MCV: 92.3 fL (ref 78.0–100.0)
MONO ABS: 0.8 10*3/uL (ref 0.1–1.0)
MONOS PCT: 10 %
NEUTROS ABS: 6.1 10*3/uL (ref 1.7–7.7)
NEUTROS PCT: 71 %
Platelets: 217 10*3/uL (ref 150–400)
RBC: 4.17 MIL/uL — ABNORMAL LOW (ref 4.22–5.81)
RDW: 15.6 % — ABNORMAL HIGH (ref 11.5–15.5)
WBC: 8.6 10*3/uL (ref 4.0–10.5)

## 2018-04-05 LAB — MRSA PCR SCREENING: MRSA by PCR: NEGATIVE

## 2018-04-05 LAB — BASIC METABOLIC PANEL
ANION GAP: 12 (ref 5–15)
BUN: 35 mg/dL — ABNORMAL HIGH (ref 8–23)
CHLORIDE: 106 mmol/L (ref 98–111)
CO2: 27 mmol/L (ref 22–32)
Calcium: 9.5 mg/dL (ref 8.9–10.3)
Creatinine, Ser: 1.76 mg/dL — ABNORMAL HIGH (ref 0.61–1.24)
GFR calc Af Amer: 40 mL/min — ABNORMAL LOW (ref >60)
GFR calc non Af Amer: 34 mL/min — ABNORMAL LOW (ref >60)
GLUCOSE: 154 mg/dL — AB (ref 70–99)
POTASSIUM: 4.3 mmol/L (ref 3.5–5.1)
Sodium: 145 mmol/L (ref 135–145)

## 2018-04-05 LAB — URINALYSIS, ROUTINE W REFLEX MICROSCOPIC
BILIRUBIN URINE: NEGATIVE
Glucose, UA: NEGATIVE mg/dL
HGB URINE DIPSTICK: NEGATIVE
Ketones, ur: NEGATIVE mg/dL
Leukocytes, UA: NEGATIVE
NITRITE: NEGATIVE
PROTEIN: NEGATIVE mg/dL
SPECIFIC GRAVITY, URINE: 1.015 (ref 1.005–1.030)
pH: 7 (ref 5.0–8.0)

## 2018-04-05 LAB — URINALYSIS, COMPLETE (UACMP) WITH MICROSCOPIC
Bilirubin Urine: NEGATIVE
Glucose, UA: NEGATIVE mg/dL
Hgb urine dipstick: NEGATIVE
Ketones, ur: NEGATIVE mg/dL
Leukocytes, UA: NEGATIVE
Nitrite: NEGATIVE
Protein, ur: NEGATIVE mg/dL
Specific Gravity, Urine: 1.015 (ref 1.005–1.030)
pH: 5 (ref 5.0–8.0)

## 2018-04-05 LAB — TROPONIN I: Troponin I: 0.04 ng/mL (ref <0.03)

## 2018-04-05 LAB — PROCALCITONIN: Procalcitonin: <0.1 ng/mL

## 2018-04-05 LAB — MAGNESIUM: Magnesium: 2.2 mg/dL (ref 1.7–2.4)

## 2018-04-05 LAB — PHOSPHORUS: Phosphorus: 3 mg/dL (ref 2.5–4.6)

## 2018-04-05 LAB — CREATININE, SERUM
Creatinine, Ser: 1.62 mg/dL — ABNORMAL HIGH (ref 0.61–1.24)
GFR calc Af Amer: 44 mL/min — ABNORMAL LOW (ref >60)
GFR calc non Af Amer: 38 mL/min — ABNORMAL LOW (ref >60)

## 2018-04-05 LAB — BRAIN NATRIURETIC PEPTIDE: B NATRIURETIC PEPTIDE 5: 1493.7 pg/mL — AB (ref 0.0–100.0)

## 2018-04-05 LAB — GLUCOSE, CAPILLARY: Glucose-Capillary: 111 mg/dL — ABNORMAL HIGH (ref 70–99)

## 2018-04-05 MED ORDER — SODIUM CHLORIDE 0.9 % IV SOLN
2.0000 g | INTRAVENOUS | Status: DC
  Administered 2018-04-06 – 2018-04-07 (×2): 2 g via INTRAVENOUS
  Filled 2018-04-05 (×2): qty 2

## 2018-04-05 MED ORDER — PANTOPRAZOLE SODIUM 40 MG PO TBEC
40.0000 mg | DELAYED_RELEASE_TABLET | Freq: Every day | ORAL | Status: DC
  Administered 2018-04-05 – 2018-04-09 (×5): 40 mg via ORAL
  Filled 2018-04-05 (×5): qty 1

## 2018-04-05 MED ORDER — ALBUTEROL SULFATE (2.5 MG/3ML) 0.083% IN NEBU
5.0000 mg | INHALATION_SOLUTION | Freq: Once | RESPIRATORY_TRACT | Status: AC
  Administered 2018-04-05: 5 mg via RESPIRATORY_TRACT
  Filled 2018-04-05: qty 6

## 2018-04-05 MED ORDER — VANCOMYCIN HCL 10 G IV SOLR
1500.0000 mg | Freq: Once | INTRAVENOUS | Status: AC
  Administered 2018-04-05: 1500 mg via INTRAVENOUS
  Filled 2018-04-05: qty 1500

## 2018-04-05 MED ORDER — SODIUM CHLORIDE 0.9 % IV SOLN
2.0000 g | Freq: Once | INTRAVENOUS | Status: AC
  Administered 2018-04-05: 2 g via INTRAVENOUS
  Filled 2018-04-05: qty 2

## 2018-04-05 MED ORDER — VITAMIN D 1000 UNITS PO TABS
2000.0000 [IU] | ORAL_TABLET | Freq: Every day | ORAL | Status: DC
  Administered 2018-04-05 – 2018-04-09 (×5): 2000 [IU] via ORAL
  Filled 2018-04-05 (×5): qty 2

## 2018-04-05 MED ORDER — MOMETASONE FURO-FORMOTEROL FUM 200-5 MCG/ACT IN AERO
2.0000 | INHALATION_SPRAY | Freq: Two times a day (BID) | RESPIRATORY_TRACT | Status: DC
  Administered 2018-04-05 – 2018-04-09 (×7): 2 via RESPIRATORY_TRACT
  Filled 2018-04-05: qty 8.8

## 2018-04-05 MED ORDER — VANCOMYCIN HCL 10 G IV SOLR
1250.0000 mg | INTRAVENOUS | Status: DC
  Administered 2018-04-06 – 2018-04-07 (×2): 1250 mg via INTRAVENOUS
  Filled 2018-04-05 (×2): qty 1250

## 2018-04-05 MED ORDER — PRESERVISION AREDS 2 PO CAPS: 1.0000 | ORAL_CAPSULE | Freq: Two times a day (BID) | ORAL | Status: DC

## 2018-04-05 MED ORDER — DONEPEZIL HCL 10 MG PO TABS
10.0000 mg | ORAL_TABLET | Freq: Every morning | ORAL | Status: DC
  Administered 2018-04-05 – 2018-04-09 (×5): 10 mg via ORAL
  Filled 2018-04-05 (×5): qty 1

## 2018-04-05 MED ORDER — ENOXAPARIN SODIUM 40 MG/0.4ML ~~LOC~~ SOLN
40.0000 mg | SUBCUTANEOUS | Status: DC
  Administered 2018-04-05 – 2018-04-08 (×4): 40 mg via SUBCUTANEOUS
  Filled 2018-04-05 (×4): qty 0.4

## 2018-04-05 MED ORDER — IPRATROPIUM BROMIDE 0.02 % IN SOLN
0.5000 mg | Freq: Once | RESPIRATORY_TRACT | Status: AC
  Administered 2018-04-05: 0.5 mg via RESPIRATORY_TRACT
  Filled 2018-04-05: qty 2.5

## 2018-04-05 MED ORDER — FENOFIBRATE 160 MG PO TABS
160.0000 mg | ORAL_TABLET | Freq: Every day | ORAL | Status: DC
  Administered 2018-04-05 – 2018-04-09 (×5): 160 mg via ORAL
  Filled 2018-04-05 (×5): qty 1

## 2018-04-05 MED ORDER — ASPIRIN EC 81 MG PO TBEC
81.0000 mg | DELAYED_RELEASE_TABLET | Freq: Every day | ORAL | Status: DC
  Administered 2018-04-05 – 2018-04-09 (×5): 81 mg via ORAL
  Filled 2018-04-05 (×5): qty 1

## 2018-04-05 MED ORDER — FUROSEMIDE 10 MG/ML IJ SOLN
40.0000 mg | Freq: Two times a day (BID) | INTRAMUSCULAR | Status: DC
  Administered 2018-04-05 – 2018-04-06 (×3): 40 mg via INTRAVENOUS
  Filled 2018-04-05 (×3): qty 4

## 2018-04-05 MED ORDER — NITROGLYCERIN 0.2 MG/HR TD PT24
0.2000 mg | MEDICATED_PATCH | Freq: Every day | TRANSDERMAL | Status: DC
  Administered 2018-04-06 – 2018-04-09 (×4): 0.2 mg via TRANSDERMAL
  Filled 2018-04-05 (×6): qty 1

## 2018-04-05 MED ORDER — IPRATROPIUM-ALBUTEROL 0.5-2.5 (3) MG/3ML IN SOLN
3.0000 mL | Freq: Four times a day (QID) | RESPIRATORY_TRACT | Status: DC | PRN
  Administered 2018-04-05: 3 mL via RESPIRATORY_TRACT
  Filled 2018-04-05: qty 3

## 2018-04-05 MED ORDER — LORATADINE 10 MG PO TABS
10.0000 mg | ORAL_TABLET | Freq: Every day | ORAL | Status: DC
  Administered 2018-04-05 – 2018-04-09 (×5): 10 mg via ORAL
  Filled 2018-04-05 (×5): qty 1

## 2018-04-05 MED ORDER — FINASTERIDE 5 MG PO TABS
5.0000 mg | ORAL_TABLET | Freq: Every day | ORAL | Status: DC
  Administered 2018-04-05 – 2018-04-09 (×5): 5 mg via ORAL
  Filled 2018-04-05 (×5): qty 1

## 2018-04-05 MED ORDER — TIOTROPIUM BROMIDE MONOHYDRATE 18 MCG IN CAPS
18.0000 ug | ORAL_CAPSULE | Freq: Every day | RESPIRATORY_TRACT | Status: DC
  Administered 2018-04-06 – 2018-04-09 (×4): 18 ug via RESPIRATORY_TRACT
  Filled 2018-04-05: qty 5

## 2018-04-05 MED ORDER — MEMANTINE HCL 10 MG PO TABS
10.0000 mg | ORAL_TABLET | Freq: Two times a day (BID) | ORAL | Status: DC
  Administered 2018-04-05 – 2018-04-09 (×8): 10 mg via ORAL
  Filled 2018-04-05 (×8): qty 1

## 2018-04-05 NOTE — ED Notes (Signed)
Pt sitting at bedsdie attempting to have bowel movment. Pt becomes increasingly shob and exp wheeze noted. MD made aware.

## 2018-04-05 NOTE — ED Provider Notes (Signed)
[facility] EMERGENCY DEPARTMENT Provider Note   CSN: [id number] Arrival date & time: 04/05/18  0626     History   Chief Complaint Chief Complaint  Patient presents with  . Shortness of Breath  . Dizziness    HPI Antonio Ray is a 84 y.o. male.  The history is provided by the patient, a relative and medical records. No language interpreter was used.     84 year old male brought here EMS for SOB and dizzy.  History obtained through son who is at bedside.  Per son, patient [place]  [place] the trip, he was having some productive cough.  A call was made to his pulmonologist and patient was started on Augmentin for 5 days.  Symptoms did improve however for the past few days, patient is having some increasing cough with clear sputum and this morning, patient appears to be confused.  Patient was making phone call to family member at an early time the day which is unusual for him.  Usually patient is very sharp according to the son.  Son also report patient has been using his albuterol more than usual.  He does weigh himself daily and it is at its 170 pounds which is his baseline dry weight.  Patient has history of achalasia, and has had aspiration pneumonia in the past.  No report of any trouble swallowing recently.  He denies any urinary symptom.  Patient was given Nitropaste via EMS.  Son also mention patient has chronic right lower extremity swelling which is normal.  He also has a pacemaker.  Furthermore, patient report having dizziness where he feel imbalance when walking and states that he is seeing things when it is not there.  He does not complain of any fever or chills, denies any trouble urinating or rash.  No headache.    Past Medical History:  Diagnosis Date  . Achalasia   . AICD (automatic cardioverter/defibrillator) present 03/30/2011   Analyze ST study patient  . BPH (benign prostatic hyperplasia)   .  Chronic systolic dysfunction of left ventricle    EF 30-35%, CLASS II - III SYMPTOMS; intolerant to Coreg  . CKD (chronic kidney disease) stage 3, GFR 30-59 ml/min ([name]) 06/08/2012  . COPD (chronic obstructive pulmonary disease) ([place])   . Coronary artery disease    History of remote anterior MI with PCI to LAD in 2006; most recent cath 2007, no intervention required  . DOE (dyspnea on exertion)    with heavy exertion  . Dyspnea   . Dysrhythmia   . Esophageal dysmotility   . GERD (gastroesophageal reflux disease)   . Head injury, closed, with concussion 2000ish  . Heart murmur    hx  . HOH (hard of hearing)   . Hyperlipidemia   . Memory loss    improved  . Pneumonia "several times"   aspiration pna with at least 3 admits for this 2016.   Marland Kitchen PVC's (premature ventricular contractions)   . Renal failure   . Skin cancer "several"   "forearms; head"  . Type II diabetes mellitus [facility])     Patient Active Problem List   Diagnosis Date Noted  . Chronic respiratory failure with hypoxia ([place]) 02/22/2018  . Acute on chronic respiratory failure with hypoxia ([place]) 02/13/2018  . Aspiration pneumonia of both lower lobes due to gastric secretions ([place]) 01/02/2018  . SIRS (systemic inflammatory response syndrome) ([place]) 01/01/2018  .  Generalized weakness 12/19/2017  . Hypoxemia   . Pneumonia, aspiration ([place]) 10/13/2017  . CAP (community acquired pneumonia) 08/19/2017  . Influenza A with pneumonia 08/19/2017  . Skin cancer   . HOH (hard of hearing)   . Heart murmur   . Head injury, closed, with concussion   . Dysrhythmia   . DOE (dyspnea on exertion)   . Anginal pain ([place])   . Pneumonia 06/12/2017  . Acute UTI   . Altered mental state 02/15/2017  . Achalasia of esophagus   . Encephalopathy   . SOB (shortness of breath)   . Acute respiratory failure ([place]) 04/21/2016  . Chronic combined systolic and diastolic congestive heart failure ([place]) 04/21/2016  . Memory loss 04/21/2016  .  Allergic rhinitis 04/11/2016  . Mild cognitive impairment 08/18/2015  . AKI (acute kidney injury) ([place])   . Type 2 diabetes mellitus with stage 3 chronic kidney disease, with long-term current use of insulin ([place])   . Chronic kidney disease, stage III (moderate) ([name])   . Other specified hypotension   . Other emphysema ([place])   . AICD (automatic cardioverter/defibrillator) present   . CAD S/P percutaneous coronary angioplasty   . Cardiomyopathy, ischemic 04/03/2015  . Elevated troponin 04/03/2015  . Sepsis ([place]) 03/31/2015  . Aspiration into airway 03/21/2015  . Dysphagia   . Esophageal dysmotilities   . COPD with acute exacerbation ([place]) 01/14/2015  . Left ventricular apical thrombus 01/14/2015  . Acute on chronic systolic CHF (congestive heart failure) ([place]) 01/14/2015  . ARF (acute renal failure) ([place]) 01/14/2015  . COPD GOLD IV 10/07/2013  . Leukocytosis 09/11/2013  . Acute encephalopathy 09/10/2013  . GERD (gastroesophageal reflux disease) 09/26/2011  . BPH (benign prostatic hyperplasia) 08/22/2011  . COPD (chronic obstructive pulmonary disease) ([place]) 07/17/2011  . Coronary artery disease   . Sick sinus syndrome ([place])   . Acute on chronic combined systolic and diastolic congestive heart failure, NYHA class 3 ([place])   . Hyperlipidemia   . PVC's (premature ventricular contractions)   . Myocardial infarction [facility]) 08/01/1992    Past Surgical History:  Procedure Laterality Date  . BALLOON DILATION N/A 09/09/2015   Procedure: BALLOON DILATION;  Surgeon: Mauri Pole, MD;  Location: [facility] ENDOSCOPY;  Service: Endoscopy;  Laterality: N/A;  rigiflex achalasia balloon dilators  . BOTOX INJECTION N/A 04/08/2015   Procedure: BOTOX INJECTION;  Surgeon: Milus Banister, MD;  Location: [facility];  Service: Endoscopy;  Laterality: N/A;  . CARDIAC CATHETERIZATION  01/11/2006   DEMONSTRATES AKINESIA OF THE DISTAL ANTERIOR WALL, DISTAL INFERIOR WALL AND AKINESIA OF THE APEX. THE BASAL  SEGMENTS CONTRACT WELL AND OVERALL EF 35%  . CATARACT EXTRACTION W/ INTRAOCULAR LENS  IMPLANT, BILATERAL  08/2014-09/2014  . CHOLECYSTECTOMY  2/11  . CORONARY ANGIOPLASTY  1994   TO THE LAD  . ESOPHAGEAL MANOMETRY N/A 03/16/2015   Procedure: ESOPHAGEAL MANOMETRY (EM);  Surgeon: Jerene Bears, MD;  Location: [facility] ENDOSCOPY;  Service: Gastroenterology;  Laterality: N/A;  . ESOPHAGOGASTRODUODENOSCOPY N/A 04/08/2015   Procedure: ESOPHAGOGASTRODUODENOSCOPY (EGD);  Surgeon: Milus Banister, MD;  Location: [facility];  Service: Endoscopy;  Laterality: N/A;  . ESOPHAGOGASTRODUODENOSCOPY N/A 01/03/2018   Procedure: ESOPHAGOGASTRODUODENOSCOPY (EGD);  Surgeon: Mauri Pole, MD;  Location: [facility] ENDOSCOPY;  Service: Endoscopy;  Laterality: N/A;  . ESOPHAGOGASTRODUODENOSCOPY (EGD) WITH PROPOFOL N/A 09/09/2015   Procedure: ESOPHAGOGASTRODUODENOSCOPY (EGD) WITH PROPOFOL;  Surgeon: Mauri Pole, MD;  Location: [facility] ENDOSCOPY;  Service: Endoscopy;  Laterality: N/A;  pt. is to have gastrografin xray  post recovery-do not discharge until results are back  . EYE SURGERY    . FOOT FRACTURE SURGERY Right 1980's  . INSERT / REPLACE / REMOVE PACEMAKER    . SAVORY DILATION N/A 01/03/2018   Procedure: RIGIFLEX DILATION;  Surgeon: Mauri Pole, MD;  Location: [facility] ENDOSCOPY;  Service: Endoscopy;  Laterality: N/A;  . SKIN CANCER EXCISION  "several"   "forearms, head"        Home Medications    Prior to Admission medications   Medication Sig Start Date End Date Taking? Authorizing Provider  albuterol (PROVENTIL HFA;VENTOLIN HFA) 108 (90 Base) MCG/ACT inhaler Inhale 2 puffs into the lungs every 6 (six) hours as needed for wheezing or shortness of breath. 11/16/17   Juanito Doom, MD  amoxicillin-clavulanate (AUGMENTIN) 875-125 MG tablet Take 1 tablet by mouth 2 (two) times daily. 03/29/18   Juanito Doom, MD  aspirin 81 MG EC tablet Take 81 mg by mouth daily.      [provider]  Cholecalciferol  (VITAMIN D) 2000 units tablet Take 2,000 Units by mouth daily.     [provider]  donepezil (ARICEPT) 10 MG tablet Take 1 tablet (10 mg total) by mouth every morning. 05/01/17   Marcial Pacas, MD  fenofibrate (TRICOR) 145 MG tablet Take 145 mg by mouth once a day 02/21/18   [place], Peter M, MD  finasteride (PROSCAR) 5 MG tablet Take 5 mg by mouth daily. 05/19/17   [provider]  furosemide (LASIX) 20 MG tablet Take 1 tablet (20 mg total) by mouth daily. Take 2 tablets (40 mg daily) for 2 days, and then resume 1 tablet (20 mg) daily. 01/05/18   Ghimire, Henreitta Leber, MD  furosemide (LASIX) 20 MG tablet TAKE 2 TABLETS(40 MG) BY MOUTH DAILY 03/22/18   [place], Peter M, MD  glucose blood (FREESTYLE TEST STRIPS) test strip Use as instructed 3x a day 02/05/18   Philemon Kingdom, MD  insulin aspart (NOVOLOG) 100 UNIT/ML injection Inject 4-10 Units into the skin 3 (three) times daily before meals. Inject up to 6 units 3x a day before meals - pens please 02/05/18   Philemon Kingdom, MD  Insulin Glargine [facility]) 100 UNIT/ML SOPN INJECT  6 UNITS UNDER THE SKIN EVERY MORNING 02/05/18   Philemon Kingdom, MD  Insulin Pen Needle 32G X 4 MM MISC Use 1x a day 02/05/18   Philemon Kingdom, MD  loratadine (CLARITIN) 10 MG tablet Take 1 tablet (10 mg total) by mouth daily. 07/02/15   Barton Dubois, MD  memantine (NAMENDA) 10 MG tablet Take 1 tablet (10 mg total) by mouth 2 (two) times daily. 05/01/17   Marcial Pacas, MD  Multiple Vitamins-Minerals (PRESERVISION AREDS 2) CAPS Take 1 capsule by mouth 2 (two) times daily.    [provider]  nitroGLYCERIN (NITRODUR - DOSED IN MG/24 HR) 0.2 mg/hr patch Place 1 patch (0.2 mg total) onto the skin daily. Alternates patch placement with odd/even days. Patient taking differently: Place 0.2 mg onto the skin daily.  11/22/17   [place]ique, Peter M, MD  pantoprazole (PROTONIX) 40 MG tablet Take 1 tablet (40 mg total) by mouth 2 (two) times daily. Patient taking  differently: Take 40 mg by mouth daily.  10/16/17   Regalado, Belkys A, MD  pravastatin (PRAVACHOL) 40 MG tablet Take 1 tablet (40 mg total) by mouth at bedtime. 05/26/17   [place], Peter M, MD  SPIRIVA HANDIHALER 18 MCG inhalation capsule INHALE CONTENTS OF 1 CAPSULE VIA HANDIHALER ONCE  DAILY 12/27/17   Juanito Doom, MD  SYMBICORT 160-4.5 MCG/ACT inhaler INHALE 2 PUFFS BY MOUTH TWICE DAILY 02/21/18   Juanito Doom, MD    Family History Family History  Problem Relation Age of Onset  . Stroke Father        Mother history unknown - never met her  . Venous thrombosis Sister   . Colon cancer Neg Hx     Social History Social History   Tobacco Use  . Smoking status: Former Smoker    Packs/day: 1.00    Years: 35.00    Pack years: 35.00    Types: Cigarettes    Last attempt to quit: 08/01/1992    Years since quitting: 25.6  . Smokeless tobacco: Former Systems developer    Quit date: 1994  Substance Use Topics  . Alcohol use: No    Alcohol/week: 0.0 standard drinks  . Drug use: No     Allergies   Codeine; Dilaudid [hydromorphone hcl]; Flomax [tamsulosin hcl]; Morphine and related; Sulfa antibiotics; Beta adrenergic blockers; and Carvedilol   Review of Systems Review of Systems  All other systems reviewed and are negative.    Physical Exam Updated Vital Signs BP 120/68 (BP Location: Left Arm)   Pulse 90   Temp (!) 97.5 F (36.4 C) (Oral)   Resp (!) 22   Ht 5' 7"  (1.702 m)   SpO2 96%   BMI 27.74 kg/m   Physical Exam  Constitutional: He is oriented to person, place, and time. He appears well-developed and well-nourished. No distress.  Elderly male laying in bed nontoxic in appearance  HENT:  Head: Atraumatic.  Eyes: Conjunctivae are normal.  Neck: Neck supple. No JVD present.  Cardiovascular: Normal rate and regular rhythm.  Pulmonary/Chest:  Patient mildly tachypneic, adventitious breath sounds heard with occasional scattered wheezes, and faint crackles at lung base    Abdominal: Soft. There is no tenderness.  Musculoskeletal:       Right lower leg: He exhibits edema (1+ pitting edema to right lower extremity with mild skin erythema, intact DP pulse).       Left lower leg: He exhibits edema (Trace edema to left lower extremity with intact DP pulse).  Neurological: He is alert and oriented to person, place, and time.  Skin: No rash noted.  Psychiatric: He has a normal mood and affect.  Nursing note and vitals reviewed.    ED Treatments / Results  Labs (all labs ordered are listed, but only abnormal results are displayed) Labs Reviewed  BASIC METABOLIC PANEL - Abnormal; Notable for the following components:      Result Value   Glucose, Bld 154 (*)    BUN 35 (*)    Creatinine, Ser 1.76 (*)    GFR calc non Af Amer 34 (*)    GFR calc Af Amer 40 (*)    All other components within normal limits  CBC WITH DIFFERENTIAL/PLATELET - Abnormal; Notable for the following components:   RBC 4.17 (*)    Hemoglobin 11.5 (*)    HCT 38.5 (*)    MCHC 29.9 (*)    RDW 15.6 (*)    All other components within normal limits  BRAIN NATRIURETIC PEPTIDE - Abnormal; Notable for the following components:   B Natriuretic Peptide 1,493.7 (*)    All other components within normal limits  URINALYSIS, ROUTINE W REFLEX MICROSCOPIC  I-STAT TROPONIN, ED    EKG EKG Interpretation  Date/Time:  Thursday April 05 2018 07:48:38 EDT Ventricular Rate:  90 PR Interval:    QRS Duration: 164 QT Interval:  383 QTC Calculation: 469 R Axis:   -69 Text Interpretation:  Sinus rhythm Consider left atrial enlargement Nonspecific IVCD with LAD Anterior infarct, old When compared to prior, no significant changes seen.  No STEMI Confirmed by Antony Blackbird [phone]) on 04/05/2018 8:34:10 AM Also confirmed by Antony Blackbird [phone]), editor [place], Jeannetta Nap [phone])  on 04/05/2018 8:41:01 AM   Radiology Dg Chest 2 View  Result Date: 04/05/2018 CLINICAL DATA:  Dyspnea, COPD EXAM: CHEST - 2  VIEW COMPARISON:  02/13/2018 chest radiograph. FINDINGS: Stable configuration of 2 lead left subclavian ICD. Stable cardiomediastinal silhouette with mild cardiomegaly. No pneumothorax. Trace bilateral pleural effusions. Cephalization of the pulmonary vasculature without overt pulmonary edema. Hyperinflated lungs. New patchy mild right lung base opacity. IMPRESSION: 1. New mild patchy right lung base opacity, cannot exclude aspiration or pneumonia. Recommend follow-up chest radiographs to resolution. 2. Hyperinflated lungs, compatible with the provided history of COPD. 3. Stable mild cardiomegaly without overt pulmonary edema. Trace bilateral pleural effusions. Electronically Signed   By: Ilona Sorrel M.D.   On: 04/05/2018 09:46    Procedures Procedures (including critical care time)  Medications Ordered in ED Medications  vancomycin (VANCOCIN) 1,500 mg in sodium chloride 0.9 % 500 mL IVPB (has no administration in time range)  vancomycin (VANCOCIN) 1,250 mg in sodium chloride 0.9 % 250 mL IVPB (has no administration in time range)  albuterol (PROVENTIL) (2.5 MG/3ML) 0.083% nebulizer solution 5 mg (5 mg Nebulization Given 04/05/18 0931)  ipratropium (ATROVENT) nebulizer solution 0.5 mg (0.5 mg Nebulization Given 04/05/18 0931)  ceFEPIme (MAXIPIME) 2 g in sodium chloride 0.9 % 100 mL IVPB (2 g Intravenous New Bag/Given (Non-Interop) 04/05/18 1045)     Initial Impression / Assessment and Plan / ED Course  I have reviewed the triage vital signs and the nursing notes.  Pertinent labs & imaging results that were available during my care of the patient were reviewed by me and considered in my medical decision making (see chart for details).     BP (!) 112/56   Pulse 79   Temp (!) 97.5 F (36.4 C) (Oral)   Resp (!) 24   Ht 5' 7"  (1.702 m)   SpO2 94%   BMI 27.74 kg/m    Final Clinical Impressions(s) / ED Diagnoses   Final diagnoses:  Aspiration pneumonia of right lower lobe, unspecified  aspiration pneumonia type ([place])  Acute on chronic systolic congestive heart failure [facility])    ED Discharge Orders    None     9:05 AM Patient here for confusion, delirium, and increased shortness of breath.  History of aspiration pneumonia in the past as well as history of UTI.  Care discussed with Dr. Sherry Ruffing.    10:03 AM Chest x-ray shows new mild patchy right lung base opacity which cannot be excluded for aspiration or pneumonia.  There is hyperinflated lungs compatible with COPD.  There is trace bilateral pleural effusions.  His BNP is 1493.  Evidence of renal insufficiency with BUN 35, creatinine 1.76.  Patient will be treated with cefepime and Vanco, will consult for admission.  11:18 AM Appreciate consultation from [facility] Dr. Marthenia Rolling who agrees to see and admit pt for further care.    Domenic Moras, PA-C 04/05/18 1119    Tegeler, Gwenyth Allegra, MD 04/05/18 2039

## 2018-04-05 NOTE — H&P (Signed)
History and Physical  Antonio Ray HWE:[id number] DOB: [birth year] DOA: 04/05/2018  Referring physician: ER provider PCP: [name], MD  Outpatient Specialists:    Patient coming from: Home  Chief Complaint: Altered mental status  HPI:  Patient is an 84 year old Caucasian male, with past medical history significant for recurrent aspiration, pneumonia, achalasia, diabetes mellitus type 2, hyperlipidemia, chronic kidney disease stage III, COPD, memory loss (dementia), chronic systolic congestive heart failure with EF of 20 to 25%, status post AICD placement, BPH and ischemic cardiomyopathy.  Patient could not give any significant history due to dementing illness.  No family member available.  Patient tells me that he has come to the hospital as there was need to change environment!  Information from the ER provider is that the patient lives alone at home, has had intermittent productive cough, and has become delirious in the last 1 to 2 days.  Visual hallucination was reported.  On further questioning, patient admits to having cough that is productive of whitish sputum, with associated chills.  No leukocytosis is noted.  Chest x-ray was said to revealed new mild patchy right lung base opacity, aspiration or pneumonia could not be ruled out.  Hyperinflation of the lungs were also noted.  UA is nonrevealing.  Hospitalist service has been asked to admit patient for further assessment and management.  ED Course: On presentation to the ER, patient has remained afebrile, with temperature of 97.5 F.  CBC reveals WBC of 8.6, hemoglobin of 11.5, hematocrit of 38.5, MCV of 92.3 with platelet count of 217.  Chemistry reveals sodium of 145, potassium of 4.3, chloride 106, CO2 27, BUN of 35 with creatinine of 1.76.  BNP is 1493.  First troponin was 0.06.  Patient has been started on IV vancomycin and cefepime for possible aspiration pneumonia. Pertinent labs: As above. EKG: Independently reviewed.  Imaging:  independently reviewed.   Review of Systems:  Negative for fever (according to patient), visual changes, sore throat, rash, new muscle aches, chest pain, SOB, dysuria, bleeding, n/v/abdominal pain.  Past Medical History:  Diagnosis Date  . Achalasia   . AICD (automatic cardioverter/defibrillator) present 03/30/2011   Analyze ST study patient  . BPH (benign prostatic hyperplasia)   . Chronic systolic dysfunction of left ventricle    EF 30-35%, CLASS II - III SYMPTOMS; intolerant to Coreg  . CKD (chronic kidney disease) stage 3, GFR 30-59 ml/min ([name]) 06/08/2012  . COPD (chronic obstructive pulmonary disease) ([place])   . Coronary artery disease    History of remote anterior MI with PCI to LAD in 2006; most recent cath 2007, no intervention required  . DOE (dyspnea on exertion)    with heavy exertion  . Dyspnea   . Dysrhythmia   . Esophageal dysmotility   . GERD (gastroesophageal reflux disease)   . Head injury, closed, with concussion 2000ish  . Heart murmur    hx  . HOH (hard of hearing)   . Hyperlipidemia   . Memory loss    improved  . Pneumonia "several times"   aspiration pna with at least 3 admits for this 2016.   Marland Kitchen PVC's (premature ventricular contractions)   . Renal failure   . Skin cancer "several"   "forearms; head"  . Type II diabetes mellitus ([place])     Past Surgical History:  Procedure Laterality Date  . BALLOON DILATION N/A 09/09/2015   Procedure: BALLOON DILATION;  Surgeon: Mauri Pole, MD;  Location: [facility];  Service: Endoscopy;  Laterality:  N/A;  rigiflex achalasia balloon dilators  . BOTOX INJECTION N/A 04/08/2015   Procedure: BOTOX INJECTION;  Surgeon: Milus Banister, MD;  Location: [facility];  Service: Endoscopy;  Laterality: N/A;  . CARDIAC CATHETERIZATION  01/11/2006   DEMONSTRATES AKINESIA OF THE DISTAL ANTERIOR WALL, DISTAL INFERIOR WALL AND AKINESIA OF THE APEX. THE BASAL SEGMENTS CONTRACT WELL AND OVERALL EF 35%  . CATARACT EXTRACTION W/  INTRAOCULAR LENS  IMPLANT, BILATERAL  08/2014-09/2014  . CHOLECYSTECTOMY  2/11  . CORONARY ANGIOPLASTY  1994   TO THE LAD  . ESOPHAGEAL MANOMETRY N/A 03/16/2015   Procedure: ESOPHAGEAL MANOMETRY (EM);  Surgeon: Jerene Bears, MD;  Location: [facility];  Service: Gastroenterology;  Laterality: N/A;  . ESOPHAGOGASTRODUODENOSCOPY N/A 04/08/2015   Procedure: ESOPHAGOGASTRODUODENOSCOPY (EGD);  Surgeon: Milus Banister, MD;  Location: [facility];  Service: Endoscopy;  Laterality: N/A;  . ESOPHAGOGASTRODUODENOSCOPY N/A 01/03/2018   Procedure: ESOPHAGOGASTRODUODENOSCOPY (EGD);  Surgeon: Mauri Pole, MD;  Location: [facility];  Service: Endoscopy;  Laterality: N/A;  . ESOPHAGOGASTRODUODENOSCOPY (EGD) WITH PROPOFOL N/A 09/09/2015   Procedure: ESOPHAGOGASTRODUODENOSCOPY (EGD) WITH PROPOFOL;  Surgeon: Mauri Pole, MD;  Location: [facility];  Service: Endoscopy;  Laterality: N/A;  pt. is to have gastrografin xray post recovery-do not discharge until results are back  . EYE SURGERY    . FOOT FRACTURE SURGERY Right 1980's  . INSERT / REPLACE / REMOVE PACEMAKER    . SAVORY DILATION N/A 01/03/2018   Procedure: RIGIFLEX DILATION;  Surgeon: Mauri Pole, MD;  Location: [facility];  Service: Endoscopy;  Laterality: N/A;  . SKIN CANCER EXCISION  "several"   "forearms, head"     reports that he quit smoking about 25 years ago. His smoking use included cigarettes. He has a 35.00 pack-year smoking history. He quit smokeless tobacco use about 25 years ago. He reports that he does not drink alcohol or use drugs.  Allergies  Allergen Reactions  . Codeine Other (See Comments)    Gets very angry, disoriented  . Dilaudid [Hydromorphone Hcl] Other (See Comments)    VERY AGITATED, HOSTILE  . Flomax [Tamsulosin Hcl] Shortness Of Breath  . Morphine And Related Other (See Comments)    VERY AGITATED, HOSTILE  . Sulfa Antibiotics Shortness Of Breath  . Beta Adrenergic Blockers Other (See Comments)     Disorientation  . Carvedilol Other (See Comments)    DISORIENTATION    Family History  Problem Relation Age of Onset  . Stroke Father        Mother history unknown - never met her  . Venous thrombosis Sister   . Colon cancer Neg Hx      Prior to Admission medications   Medication Sig Start Date End Date Taking? Authorizing Provider  albuterol (PROVENTIL HFA;VENTOLIN HFA) 108 (90 Base) MCG/ACT inhaler Inhale 2 puffs into the lungs every 6 (six) hours as needed for wheezing or shortness of breath. 11/16/17  Yes Juanito Doom, MD  aspirin 81 MG EC tablet Take 81 mg by mouth daily.     Yes [provider]  Cholecalciferol (VITAMIN D) 2000 units tablet Take 2,000 Units by mouth daily.    Yes [provider]  donepezil (ARICEPT) 10 MG tablet Take 1 tablet (10 mg total) by mouth every morning. 05/01/17  Yes Marcial Pacas, MD  fenofibrate (TRICOR) 145 MG tablet Take 145 mg by mouth once a day 02/21/18  Yes Martinique, Peter M, MD  finasteride (PROSCAR) 5 MG tablet Take 5 mg by mouth  daily. 05/19/17  Yes [provider]  furosemide (LASIX) 20 MG tablet Take 1 tablet (20 mg total) by mouth daily. Take 2 tablets (40 mg daily) for 2 days, and then resume 1 tablet (20 mg) daily. 01/05/18  Yes Ghimire, Henreitta Leber, MD  insulin aspart (NOVOLOG) 100 UNIT/ML injection Inject 4-10 Units into the skin 3 (three) times daily before meals. Inject up to 6 units 3x a day before meals - pens please 02/05/18  Yes Philemon Kingdom, MD  Insulin Glargine (BASAGLAR KWIKPEN) 100 UNIT/ML SOPN INJECT  6 UNITS UNDER THE SKIN EVERY MORNING 02/05/18  Yes Philemon Kingdom, MD  loratadine (CLARITIN) 10 MG tablet Take 1 tablet (10 mg total) by mouth daily. 07/02/15  Yes Barton Dubois, MD  memantine (NAMENDA) 10 MG tablet Take 1 tablet (10 mg total) by mouth 2 (two) times daily. 05/01/17  Yes Marcial Pacas, MD  Multiple Vitamins-Minerals (PRESERVISION AREDS 2) CAPS Take 1 capsule by mouth 2 (two) times daily.   Yes  [provider]  nitroGLYCERIN (NITRODUR - DOSED IN MG/24 HR) 0.2 mg/hr patch Place 1 patch (0.2 mg total) onto the skin daily. Alternates patch placement with odd/even days. Patient taking differently: Place 0.2 mg onto the skin daily.  11/22/17  Yes Martinique, Peter M, MD  pantoprazole (PROTONIX) 40 MG tablet Take 1 tablet (40 mg total) by mouth 2 (two) times daily. Patient taking differently: Take 40 mg by mouth daily.  10/16/17  Yes Regalado, Cassie Freer, MD  SPIRIVA HANDIHALER 18 MCG inhalation capsule INHALE CONTENTS OF 1 CAPSULE VIA HANDIHALER ONCE DAILY 12/27/17  Yes Juanito Doom, MD  SYMBICORT 160-4.5 MCG/ACT inhaler INHALE 2 PUFFS BY MOUTH TWICE DAILY 02/21/18  Yes Juanito Doom, MD  glucose blood (FREESTYLE TEST STRIPS) test strip Use as instructed 3x a day 02/05/18   Philemon Kingdom, MD  Insulin Pen Needle 32G X 4 MM MISC Use 1x a day 02/05/18   Philemon Kingdom, MD    Physical Exam: Vitals:   04/05/18 1115 04/05/18 1130 04/05/18 1145 04/05/18 1200  BP: (!) 113/55 115/84 118/63 130/72  Pulse: 86 84 83 (!) 56  Resp: (!) 24 (!) 22 (!) 22 (!) 24  Temp:      TempSrc:      SpO2: 100% 100% 100% 100%  Height:       Constitutional:  . Appears calm and comfortable. Eyes:  Marland Kitchen Mild pallor. No jaundice.  ENMT:  . external ears, nose appear normal Neck:  . Neck is supple. No JVD Respiratory:  . Decreased air entry, with inspiratory wheeze at the right upper lobe area anteriorly.    Marland Kitchen Respiratory effort normal. No retractions or accessory muscle use Cardiovascular:  . S1S2 . 2+ to 3 bilateral LE extremity edema   Abdomen:  Abdomen is obese, soft and nontender.  Organs are difficult to assess.   Neurologic:  . Awake and alert. . Moves all limbs. . Patient could not tell me who the president was.  Patient could not give cohesive history.  Wt Readings from Last 3 Encounters:  03/23/18 80.3 kg  02/22/18 77.5 kg  02/05/18 76.8 kg    I have personally reviewed  following labs and imaging studies  Labs on Admission:  CBC: Recent Labs  Lab 04/05/18 0826  WBC 8.6  NEUTROABS 6.1  HGB 11.5*  HCT 38.5*  MCV 92.3  PLT 810   Basic Metabolic Panel: Recent Labs  Lab 04/05/18 0826  NA 145  K 4.3  CL  106  CO2 27  GLUCOSE 154*  BUN 35*  CREATININE 1.76*  CALCIUM 9.5   Liver Function Tests: No results for input(s): AST, ALT, ALKPHOS, BILITOT, PROT, ALBUMIN in the last 168 hours. No results for input(s): LIPASE, AMYLASE in the last 168 hours. No results for input(s): AMMONIA in the last 168 hours. Coagulation Profile: No results for input(s): INR, PROTIME in the last 168 hours. Cardiac Enzymes: No results for input(s): CKTOTAL, CKMB, CKMBINDEX, TROPONINI in the last 168 hours. BNP (last 3 results) No results for input(s): PROBNP in the last 8760 hours. HbA1C: No results for input(s): HGBA1C in the last 72 hours. CBG: No results for input(s): GLUCAP in the last 168 hours. Lipid Profile: No results for input(s): CHOL, HDL, LDLCALC, TRIG, CHOLHDL, LDLDIRECT in the last 72 hours. Thyroid Function Tests: No results for input(s): TSH, T4TOTAL, FREET4, T3FREE, THYROIDAB in the last 72 hours. Anemia Panel: No results for input(s): VITAMINB12, FOLATE, FERRITIN, TIBC, IRON, RETICCTPCT in the last 72 hours. Urine analysis:    Component Value Date/Time   COLORURINE YELLOW 04/05/2018 1005   APPEARANCEUR CLEAR 04/05/2018 1005   LABSPEC 1.015 04/05/2018 1005   PHURINE 7.0 04/05/2018 1005   GLUCOSEU NEGATIVE 04/05/2018 1005   HGBUR NEGATIVE 04/05/2018 1005   BILIRUBINUR NEGATIVE 04/05/2018 1005   BILIRUBINUR neg 06/06/2012 1037   KETONESUR NEGATIVE 04/05/2018 1005   PROTEINUR NEGATIVE 04/05/2018 1005   UROBILINOGEN 0.2 05/12/2015 0920   NITRITE NEGATIVE 04/05/2018 1005   LEUKOCYTESUR NEGATIVE 04/05/2018 1005   Sepsis Labs: _0 (procalcitonin:4,lacticidven:4) ) Recent Results (from the past 240 hour(s))   MYCOBACTERIA, CULTURE,  WITH FLUOROCHROME SMEAR     Status: None (Preliminary result)   Collection Time: 03/28/18  4:44 PM  Result Value Ref Range Status   MICRO NUMBER: [id number]  Preliminary   SPECIMEN QUALITY: ADEQUATE  Preliminary   Source: SPUTUM  Preliminary   STATUS: PRELIMINARY  Preliminary   SMEAR: No acid fast bacilli seen.  Preliminary   RESULT:   Preliminary    Culture results to follow. Final reports of negative cultures can be expected in approximately six weeks. Positive cultures are reported immediately.      Radiological Exams on Admission: Dg Chest 2 View  Result Date: 04/05/2018 CLINICAL DATA:  Dyspnea, COPD EXAM: CHEST - 2 VIEW COMPARISON:  02/13/2018 chest radiograph. FINDINGS: Stable configuration of 2 lead left subclavian ICD. Stable cardiomediastinal silhouette with mild cardiomegaly. No pneumothorax. Trace bilateral pleural effusions. Cephalization of the pulmonary vasculature without overt pulmonary edema. Hyperinflated lungs. New patchy mild right lung base opacity. IMPRESSION: 1. New mild patchy right lung base opacity, cannot exclude aspiration or pneumonia. Recommend follow-up chest radiographs to resolution. 2. Hyperinflated lungs, compatible with the provided history of COPD. 3. Stable mild cardiomegaly without overt pulmonary edema. Trace bilateral pleural effusions. Electronically Signed   By: Ilona Sorrel M.D.   On: 04/05/2018 09:46    EKG: Independently reviewed.    Admit patient to inpatient:  Active Problems:   Altered mental status   Assessment/Plan 1. Altered mental status/encephalopathy, likely toxic. 2. Possible aspiration pneumonia 3. Dementia 4. Chronic systolic congestive heart failure, stable 5. Ischemic cardiomyopathy. 6. Chronic kidney disease stage III, stable 7. Diabetes mellitus 8. COPD, stable 9. History of achalasia.   Admit patient for further assessment and management  Panculture patient  Check procalcitonin  CT chest without contrast  IV  diuretics  Aspiration precaution  MRSA PCR  Continue inhalers  Optimize blood sugar control  Speech evaluation  Further  management will depend on hospital course  DVT prophylaxis: Subcu clonus Code Status: Full Family Communication:  Disposition Plan: Will depend on hospital course Consults called: None Admission status: Inpatient  Time spent: 35 minutes minutes  Dana Allan, MD  [facility] Pager #: [phone] 7PM-7AM contact night coverage as above   04/05/2018, 12:18 PM

## 2018-04-05 NOTE — ED Notes (Signed)
Pt returned from radiology. Urinal given for UA

## 2018-04-05 NOTE — ED Triage Notes (Signed)
Patient presents with complaints of Shortness of breath and dizziness. Patient reports hallucinations states he see people and when he walks up to them they are gone; patient denies any chest pain, n/V. Denies any shortness of breath on arrival. Patient alert and oriented x4 on arrival,Patient has history of multiple UTI.

## 2018-04-05 NOTE — Progress Notes (Signed)
Nurse called to patient's room by charge nurse.  Patient wanted to use the urinal and was being assisted by admitting nurse to stand at bedside when he experienced shortness of breath and could barely stand.  Patient assisted back to bed. O2 SAT 99 at 2L oxygen [name].  Patient denies any discomfort after the episode.  No signs of respiratory distress at this time.  Will continue to monitor.  Family at bedside. Antonio Ray Antonio Ray

## 2018-04-05 NOTE — Progress Notes (Signed)
Patient's son at bedside, requesting to talk to MD regarding patient being NPO.  MD paged and notified. Marcille Blanco, RN

## 2018-04-05 NOTE — ED Notes (Signed)
Pt to CT

## 2018-04-05 NOTE — Progress Notes (Addendum)
Pharmacy Antibiotic Note  Antonio Ray is a 84 y.o. male admitted on 04/05/2018 with pneumonia.  Pharmacy has been consulted for vancomycin dosing. Also with 1x dose of cefepime ordered in the ED. On Augmentin x 5 days PTA. SCr 1.76 on admit, CrCl~30-35. Afebrile, WBC wnl.  Plan: Cefepime 2g IV x 1 ordered per EDP Vancomycin 1500mg  IV x 1; then 1250mg  IV q24h Monitor clinical progress, c/s, renal function F/u de-escalation plan/LOT, vancomycin trough as indicated  ADDENDUM:  Pharmacy consulted to dose cefepime.  Plan: Continue cefepime 2g IV q24h Vancomycin 1250mg  IV q24h Monitor clinical progress, c/s, renal function F/u de-escalation plan/LOT, vancomycin trough as indicated     Height: 5\' 7"  (170.2 cm) IBW/kg (Calculated) : 66.1  Temp (24hrs), Avg:97.5 F (36.4 C), Min:97.5 F (36.4 C), Max:97.5 F (36.4 C)  Recent Labs  Lab 04/05/18 0826  WBC 8.6  CREATININE 1.76*    Estimated Creatinine Clearance: 32.9 mL/min (A) (by C-G formula based on SCr of 1.76 mg/dL (H)).    Allergies  Allergen Reactions  . Codeine Other (See Comments)    Gets very angry, disoriented  . Dilaudid [Hydromorphone Hcl] Other (See Comments)    VERY AGITATED, HOSTILE  . Flomax [Tamsulosin Hcl] Shortness Of Breath  . Morphine And Related Other (See Comments)    VERY AGITATED, HOSTILE  . Sulfa Antibiotics Shortness Of Breath  . Beta Adrenergic Blockers Other (See Comments)    Disorientation  . Carvedilol Other (See Comments)    DISORIENTATION    Elicia Lamp, PharmD, BCPS Clinical Pharmacist Clinical phone [phone] Please check AMION for all [facility] contact numbers 04/05/2018 10:03 AM

## 2018-04-06 ENCOUNTER — Telehealth: Payer: Self-pay | Admitting: Pulmonary Disease

## 2018-04-06 DIAGNOSIS — Z515 Encounter for palliative care: Secondary | ICD-10-CM

## 2018-04-06 DIAGNOSIS — Z7189 Other specified counseling: Secondary | ICD-10-CM

## 2018-04-06 DIAGNOSIS — I5023 Acute on chronic systolic (congestive) heart failure: Secondary | ICD-10-CM

## 2018-04-06 DIAGNOSIS — J690 Pneumonitis due to inhalation of food and vomit: Principal | ICD-10-CM

## 2018-04-06 LAB — GLUCOSE, CAPILLARY
Glucose-Capillary: 118 mg/dL — ABNORMAL HIGH (ref 70–99)
Glucose-Capillary: 149 mg/dL — ABNORMAL HIGH (ref 70–99)
Glucose-Capillary: 166 mg/dL — ABNORMAL HIGH (ref 70–99)

## 2018-04-06 LAB — BASIC METABOLIC PANEL
Anion gap: 11 (ref 5–15)
BUN: 29 mg/dL — ABNORMAL HIGH (ref 8–23)
CO2: 27 mmol/L (ref 22–32)
Calcium: 9 mg/dL (ref 8.9–10.3)
Chloride: 105 mmol/L (ref 98–111)
Creatinine, Ser: 1.58 mg/dL — ABNORMAL HIGH (ref 0.61–1.24)
GFR calc Af Amer: 45 mL/min — ABNORMAL LOW (ref >60)
GFR calc non Af Amer: 39 mL/min — ABNORMAL LOW (ref >60)
Glucose, Bld: 116 mg/dL — ABNORMAL HIGH (ref 70–99)
Potassium: 4 mmol/L (ref 3.5–5.1)
Sodium: 143 mmol/L (ref 135–145)

## 2018-04-06 LAB — CBC
HCT: 36.6 % — ABNORMAL LOW (ref 39.0–52.0)
Hemoglobin: 11.4 g/dL — ABNORMAL LOW (ref 13.0–17.0)
MCH: 28.1 pg (ref 26.0–34.0)
MCHC: 31.1 g/dL (ref 30.0–36.0)
MCV: 90.4 fL (ref 78.0–100.0)
Platelets: 192 10*3/uL (ref 150–400)
RBC: 4.05 MIL/uL — ABNORMAL LOW (ref 4.22–5.81)
RDW: 15.3 % (ref 11.5–15.5)
WBC: 8 10*3/uL (ref 4.0–10.5)

## 2018-04-06 LAB — EXPECTORATED SPUTUM ASSESSMENT W GRAM STAIN, RFLX TO RESP C

## 2018-04-06 LAB — EXPECTORATED SPUTUM ASSESSMENT W REFEX TO RESP CULTURE

## 2018-04-06 MED ORDER — ORAL CARE MOUTH RINSE
15.0000 mL | Freq: Two times a day (BID) | OROMUCOSAL | Status: DC
  Administered 2018-04-07 – 2018-04-08 (×3): 15 mL via OROMUCOSAL

## 2018-04-06 NOTE — Telephone Encounter (Signed)
It is against medicare rules/ regulations for [place] to see pts uninvited by the attending physician so should take it up with them.  Nothing else to offer from this office

## 2018-04-06 NOTE — Consult Note (Signed)
Consultation Note Date: 04/06/2018   Patient Name: Antonio Ray  DOB: Sep 15, 1933  MRN: [id number]  Age / Sex: 84 y.o., male  PCP: [name], MD Referring Physician: Modena Jansky, MD  Reason for Consultation: Establishing goals of care and Psychosocial/spiritual support  HPI/Patient Profile: 84 y.o. male  with past medical history of COPD, recurrent aspiration pneumonia, achalasia (last esophageal dilatation in June 2019; diagnosed approximately 3 years ago), chronic kidney disease stage III, systolic heart failure with an EF of 20 to 25% as of March 2019, AICD (placed in 2012), BPH, history of MI, history of cardiac cath 2007, coronary artery disease, GERD, hyperlipidemia, diabetes, admitted on 04/05/2018 with altered mental status.  Patient's BNP on admission was 1493, creatinine 1.76 chest x-ray right base opacity, questionable aspiration pneumonia again.  Consult ordered for goals of care and recurrent admissions secondary to aspiration pneumonia.  Patient has been hospitalized in 2019 in March, May, twice in June, and in July..   Clinical Assessment and Goals of Care: Met with patient, chart reviewed.  Patient's daughter, Zebadiah Willert, is at the bedside.  [name] reports since being diagnosed with achalasia approximately 3 years ago, coming to the hospital for "pneumonia is our new normal".  She shares with me that in 2017 he was hospitalized from June until December monthly.  He has been intubated in the past and he self extubated.  He is followed by Dr. Lake Bells with pulmonology and Dr. Martinique with cardiology.  Patient himself is out of bed to the chair and in no acute distress.  He is somnolent but easily awakens to voice.  He is hard of hearing.  He shares that he is feeling better since coming to the hospital but is still quite tired.  He is denying being short of breath at rest  Patient's  healthcare proxy's would be his children, Bran Aldridge and [employer].  Engaged in brief life review.  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  He lives in his own home with the support of his son and daughter  Per [name], patient has been seen by palliative medicine provider in the past.  She did not describe this as a positive experience but was agreeable to continue to let palliative provider be an additional resource and source of support during this hospitalization.    SUMMARY OF RECOMMENDATIONS   The patient is a full code by choice versus default; full scope of tx Palliative medicine to meet with patient's son hopefully on 04/07/2018 to address any further concerns, questions and goals of care Code Status/Advance Care Planning:  Full code    Palliative Prophylaxis:   Aspiration, Bowel Regimen, Delirium Protocol, Eye Care, Frequent Pain Assessment, Oral Care and Turn Reposition  Additional Recommendations (Limitations, Scope, Preferences):  Full Scope Treatment  Psycho-social/Spiritual:   Desire for further Chaplaincy support:no  Additional Recommendations: Referral to Community Resources   Prognosis:  Unable to determine but would likely qualify for his hospice benefit , potentially  having a life expectancy of less than 6 months in the setting of systolic heart failure with an ejection fraction of 20 to 25%, recurrent aspiration pneumonia secondary to achalasia, chronic kidney disease stage III  Discharge Planning: To Be Determined      Primary Diagnoses: Present on Admission: . Altered mental status   I have reviewed the medical record, interviewed the patient and family, and examined the patient. The following aspects are pertinent.  Past Medical History:  Diagnosis Date  . Achalasia   . AICD (automatic cardioverter/defibrillator) present 03/30/2011   Analyze ST study patient  . BPH (benign prostatic hyperplasia)   . Chronic  systolic dysfunction of left ventricle    EF 30-35%, CLASS II - III SYMPTOMS; intolerant to Coreg  . CKD (chronic kidney disease) stage 3, GFR 30-59 ml/min ([name]) 06/08/2012  . COPD (chronic obstructive pulmonary disease) ([place])   . Coronary artery disease    History of remote anterior MI with PCI to LAD in 2006; most recent cath 2007, no intervention required  . DOE (dyspnea on exertion)    with heavy exertion  . Dyspnea   . Dysrhythmia   . Esophageal dysmotility   . GERD (gastroesophageal reflux disease)   . Head injury, closed, with concussion 2000ish  . Heart murmur    hx  . HOH (hard of hearing)   . Hyperlipidemia   . Memory loss    improved  . Pneumonia "several times"   aspiration pna with at least 3 admits for this 2016.   Marland Kitchen PVC's (premature ventricular contractions)   . Renal failure   . Skin cancer "several"   "forearms; head"  . Type II diabetes mellitus ([place])    Social History   Socioeconomic History  . Marital status: Widowed    Spouse name: Not on file  . Number of children: 2  . Years of education: GED  . Highest education level: Not on file  Occupational History  . Occupation: Clinical biochemist: [employer]    Comment: Retired  . Occupation: [employer]    Employer: [employer]    Comment: Retired  Scientific laboratory technician  . Financial resource strain: Patient refused  . Food insecurity:    Worry: Never true    Inability: Never true  . Transportation needs:    Medical: No    Non-medical: No  Tobacco Use  . Smoking status: Former Smoker    Packs/day: 1.00    Years: 35.00    Pack years: 35.00    Types: Cigarettes    Last attempt to quit: 08/01/1992    Years since quitting: 25.6  . Smokeless tobacco: Former Systems developer    Quit date: 1994  Substance and Sexual Activity  . Alcohol use: No    Alcohol/week: 0.0 standard drinks  . Drug use: No  . Sexual activity: Not Currently  Lifestyle  . Physical activity:    Days per week: Patient refused     Minutes per session: Patient refused  . Stress: Patient refused  Relationships  . Social connections:    Talks on phone: Not on file    Gets together: Not on file    Attends religious service: Not on file    Active member of club or organization: Not on file    Attends meetings of clubs or organizations: Not on file    Relationship status: Not on file  Other Topics Concern  . Not on file  Social History Narrative  Pt lives in [place] alone.  Widowed.   Right-handed.   Rare caffeine use - maybe one cup per month.   Family History  Problem Relation Age of Onset  . Stroke Father        Mother history unknown - never met her  . Venous thrombosis Sister   . Colon cancer Neg Hx    Scheduled Meds: . aspirin EC  81 mg Oral Daily  . cholecalciferol  2,000 Units Oral Daily  . donepezil  10 mg Oral q morning - 10a  . enoxaparin (LOVENOX) injection  40 mg Subcutaneous Q24H  . fenofibrate  160 mg Oral Daily  . finasteride  5 mg Oral Daily  . furosemide  40 mg Intravenous BID  . loratadine  10 mg Oral Daily  . mouth rinse  15 mL Mouth Rinse BID  . memantine  10 mg Oral BID  . mometasone-formoterol  2 puff Inhalation BID  . nitroGLYCERIN  0.2 mg Transdermal Daily  . pantoprazole  40 mg Oral Daily  . tiotropium  18 mcg Inhalation Daily   Continuous Infusions: . ceFEPime (MAXIPIME) IV 2 g (04/06/18 0941)  . vancomycin 1,250 mg (04/06/18 1131)   PRN Meds:.ipratropium-albuterol Medications Prior to Admission:  Prior to Admission medications   Medication Sig Start Date End Date Taking? Authorizing Provider  albuterol (PROVENTIL HFA;VENTOLIN HFA) 108 (90 Base) MCG/ACT inhaler Inhale 2 puffs into the lungs every 6 (six) hours as needed for wheezing or shortness of breath. 11/16/17  Yes Juanito Doom, MD  aspirin 81 MG EC tablet Take 81 mg by mouth daily.     Yes [provider]  Cholecalciferol (VITAMIN D) 2000 units tablet Take 2,000 Units by mouth daily.    Yes  [provider]  donepezil (ARICEPT) 10 MG tablet Take 1 tablet (10 mg total) by mouth every morning. 05/01/17  Yes Marcial Pacas, MD  fenofibrate (TRICOR) 145 MG tablet Take 145 mg by mouth once a day 02/21/18  Yes Martinique, Peter M, MD  finasteride (PROSCAR) 5 MG tablet Take 5 mg by mouth daily. 05/19/17  Yes [provider]  furosemide (LASIX) 20 MG tablet Take 1 tablet (20 mg total) by mouth daily. Take 2 tablets (40 mg daily) for 2 days, and then resume 1 tablet (20 mg) daily. 01/05/18  Yes Ghimire, Henreitta Leber, MD  insulin aspart (NOVOLOG) 100 UNIT/ML injection Inject 4-10 Units into the skin 3 (three) times daily before meals. Inject up to 6 units 3x a day before meals - pens please 02/05/18  Yes Philemon Kingdom, MD  Insulin Glargine (BASAGLAR KWIKPEN) 100 UNIT/ML SOPN INJECT  6 UNITS UNDER THE SKIN EVERY MORNING 02/05/18  Yes Philemon Kingdom, MD  loratadine (CLARITIN) 10 MG tablet Take 1 tablet (10 mg total) by mouth daily. 07/02/15  Yes Barton Dubois, MD  memantine (NAMENDA) 10 MG tablet Take 1 tablet (10 mg total) by mouth 2 (two) times daily. 05/01/17  Yes Marcial Pacas, MD  Multiple Vitamins-Minerals (PRESERVISION AREDS 2) CAPS Take 1 capsule by mouth 2 (two) times daily.   Yes [provider]  nitroGLYCERIN (NITRODUR - DOSED IN MG/24 HR) 0.2 mg/hr patch Place 1 patch (0.2 mg total) onto the skin daily. Alternates patch placement with odd/even days. Patient taking differently: Place 0.2 mg onto the skin daily.  11/22/17  Yes Martinique, Peter M, MD  pantoprazole (PROTONIX) 40 MG tablet Take 1 tablet (40 mg total) by mouth 2 (two) times daily. Patient taking differently: Take 40  mg by mouth daily.  10/16/17  Yes Regalado, Belkys A, MD  SPIRIVA HANDIHALER 18 MCG inhalation capsule INHALE CONTENTS OF 1 CAPSULE VIA HANDIHALER ONCE DAILY 12/27/17  Yes Juanito Doom, MD  SYMBICORT 160-4.5 MCG/ACT inhaler INHALE 2 PUFFS BY MOUTH TWICE DAILY 02/21/18  Yes Simonne Maffucci B, MD  glucose  blood (FREESTYLE TEST STRIPS) test strip Use as instructed 3x a day 02/05/18   Philemon Kingdom, MD  Insulin Pen Needle 32G X 4 MM MISC Use 1x a day 02/05/18   Philemon Kingdom, MD   Allergies  Allergen Reactions  . Codeine Other (See Comments)    Gets very angry, disoriented  . Dilaudid [Hydromorphone Hcl] Other (See Comments)    VERY AGITATED, HOSTILE  . Flomax [Tamsulosin Hcl] Shortness Of Breath  . Morphine And Related Other (See Comments)    VERY AGITATED, HOSTILE  . Sulfa Antibiotics Shortness Of Breath  . Beta Adrenergic Blockers Other (See Comments)    Disorientation  . Carvedilol Other (See Comments)    DISORIENTATION   Review of Systems  Unable to perform ROS: Other    Physical Exam  Constitutional: He appears well-developed and well-nourished.  Elderly man seen sitting in a recliner, somnolent but in no acute distress.  Awakens easily to voice.  He is extremely hard of hearing  HENT:  Head: Normocephalic and atraumatic.  Cardiovascular: Normal rate.  Pulmonary/Chest:  No increased work of breathing seen at rest.  Moist cough heard  Abdominal: Soft.  Neurological:  Somnolent but awakens easily to voice and answers questions relevantly.  He is very hard of hearing.  He is oriented to himself and that he is in the hospital and that he has pneumonia again  Skin: Skin is warm and dry.  Psychiatric:  Patient is sleepy; no overt agitation otherwise unable to test  Nursing note and vitals reviewed.   Vital Signs: BP (!) 107/58   Pulse 60   Temp 98.2 F (36.8 C) (Oral)   Resp 18   Ht 5' 8"  (1.727 m)   Wt 76.6 kg Comment: scale a  SpO2 96%   BMI 25.67 kg/m  Pain Scale: 0-10   Pain Score: 0-No pain   SpO2: SpO2: 96 % O2 Device:SpO2: 96 % O2 Flow Rate: .O2 Flow Rate (L/min): 2 L/min  IO: Intake/output summary:   Intake/Output Summary (Last 24 hours) at 04/06/2018 1721 Last data filed at 04/06/2018 1400 Gross per 24 hour  Intake 610 ml  Output 1800 ml  Net  -1190 ml    LBM: Last BM Date: 04/04/18 Baseline Weight: Weight: 78.2 kg(Scale A) Most recent weight: Weight: 76.6 kg(scale a)     Palliative Assessment/Data:   Flowsheet Rows     Most Recent Value  Intake Tab  Referral Department  Hospitalist  Unit at Time of Referral  Med/Surg Unit  Palliative Care Primary Diagnosis  Pulmonary  Date Notified  04/06/18  Palliative Care Type  New Palliative care  Reason for referral  Clarify Goals of Care, Psychosocial or Spiritual support  Date of Admission  04/05/18  Date first seen by Palliative Care  04/06/18  # of days Palliative referral response time  0 Day(s)  # of days IP prior to Palliative referral  1  Clinical Assessment  Palliative Performance Scale Score  40%  Pain Max last 24 hours  Not able to report  Pain Min Last 24 hours  Not able to report  Dyspnea Max Last 24 Hours  Not able to  report  Dyspnea Min Last 24 hours  Not able to report  Nausea Max Last 24 Hours  Not able to report  Nausea Min Last 24 Hours  Not able to report  Anxiety Max Last 24 Hours  Not able to report  Anxiety Min Last 24 Hours  Not able to report  Other Max Last 24 Hours  Not able to report  Psychosocial & Spiritual Assessment  Palliative Care Outcomes  Patient/Family meeting held?  Yes  Who was at the meeting?  pt, daughter  Palliative Care follow-up planned  Yes, Facility      Time In: 1600 Time Out: 1715 Time Total: 75 min Greater than 50%  of this time was spent counseling and coordinating care related to the above assessment and plan.  Signed by: Dory Horn, NP   Please contact Palliative Medicine Team phone at [phone] for questions and concerns.  For individual provider: See Shea Evans

## 2018-04-06 NOTE — Evaluation (Signed)
Clinical/Bedside Swallow Evaluation Patient Details  Name: Antonio Ray MRN: [id number] Date of Birth: 1933-10-19  Today's Date: 04/06/2018 Time: SLP Start Time (ACUTE ONLY): 0830 SLP Stop Time (ACUTE ONLY): 0853 SLP Time Calculation (min) (ACUTE ONLY): 23 min  Past Medical History:  Past Medical History:  Diagnosis Date  . Achalasia   . AICD (automatic cardioverter/defibrillator) present 03/30/2011   Analyze ST study patient  . BPH (benign prostatic hyperplasia)   . Chronic systolic dysfunction of left ventricle    EF 30-35%, CLASS II - III SYMPTOMS; intolerant to Coreg  . CKD (chronic kidney disease) stage 3, GFR 30-59 ml/min ([name]) 06/08/2012  . COPD (chronic obstructive pulmonary disease) ([place])   . Coronary artery disease    History of remote anterior MI with PCI to LAD in 2006; most recent cath 2007, no intervention required  . DOE (dyspnea on exertion)    with heavy exertion  . Dyspnea   . Dysrhythmia   . Esophageal dysmotility   . GERD (gastroesophageal reflux disease)   . Head injury, closed, with concussion 2000ish  . Heart murmur    hx  . HOH (hard of hearing)   . Hyperlipidemia   . Memory loss    improved  . Pneumonia "several times"   aspiration pna with at least 3 admits for this 2016.   Marland Kitchen PVC's (premature ventricular contractions)   . Renal failure   . Skin cancer "several"   "forearms; head"  . Type II diabetes mellitus ([place])    Past Surgical History:  Past Surgical History:  Procedure Laterality Date  . BALLOON DILATION N/A 09/09/2015   Procedure: BALLOON DILATION;  Surgeon: Mauri Pole, MD;  Location: [facility];  Service: Endoscopy;  Laterality: N/A;  rigiflex achalasia balloon dilators  . BOTOX INJECTION N/A 04/08/2015   Procedure: BOTOX INJECTION;  Surgeon: Milus Banister, MD;  Location: [facility];  Service: Endoscopy;  Laterality: N/A;  . CARDIAC CATHETERIZATION  01/11/2006   DEMONSTRATES AKINESIA OF THE DISTAL ANTERIOR WALL, DISTAL  INFERIOR WALL AND AKINESIA OF THE APEX. THE BASAL SEGMENTS CONTRACT WELL AND OVERALL EF 35%  . CATARACT EXTRACTION W/ INTRAOCULAR LENS  IMPLANT, BILATERAL  08/2014-09/2014  . CHOLECYSTECTOMY  2/11  . CORONARY ANGIOPLASTY  1994   TO THE LAD  . ESOPHAGEAL MANOMETRY N/A 03/16/2015   Procedure: ESOPHAGEAL MANOMETRY (EM);  Surgeon: Jerene Bears, MD;  Location: [facility];  Service: Gastroenterology;  Laterality: N/A;  . ESOPHAGOGASTRODUODENOSCOPY N/A 04/08/2015   Procedure: ESOPHAGOGASTRODUODENOSCOPY (EGD);  Surgeon: Milus Banister, MD;  Location: [facility];  Service: Endoscopy;  Laterality: N/A;  . ESOPHAGOGASTRODUODENOSCOPY N/A 01/03/2018   Procedure: ESOPHAGOGASTRODUODENOSCOPY (EGD);  Surgeon: Mauri Pole, MD;  Location[facility];  Service: Endoscopy;  Laterality: N/A;  . ESOPHAGOGASTRODUODENOSCOPY (EGD) WITH PROPOFOL N/A 09/09/2015   Procedure: ESOPHAGOGASTRODUODENOSCOPY (EGD) WITH PROPOFOL;  Surgeon: Mauri Pole, MD;  Location: [facility];  Service: Endoscopy;  Laterality: N/A;  pt. is to have gastrografin xray post recovery-do not discharge until results are back  . EYE SURGERY    . FOOT FRACTURE SURGERY Right 1980's  . INSERT / REPLACE / REMOVE PACEMAKER    . SAVORY DILATION N/A 01/03/2018   Procedure: RIGIFLEX DILATION;  Surgeon: Mauri Pole, MD;  Location: [facility];  Service: Endoscopy;  Laterality: N/A;  . SKIN CANCER EXCISION  "several"   "forearms, head"   HPI:  Patient is an 84 year old Caucasian male, with past medical history significant for recurrent aspiration, pneumonia, achalasia, diabetes  mellitus type 2, hyperlipidemia, chronic kidney disease stage III, COPD, memory loss (dementia), chronic systolic congestive heart failure with EF of 20 to 25%, status post AICD placement, BPH and ischemic cardiomyopathy.  Patient could not give any significant history due to dementing illness.  No family member available.  Patient tells me that he has come to the  hospital as there was need to change environment!  Information from the ER provider is that the patient lives alone at home, has had intermittent productive cough, and has become delirious in the last 1 to 2 days.  Visual hallucination was reported.  On further questioning, patient admits to having cough that is productive of whitish sputum, with associated chills.  No leukocytosis is noted.  Chest x-ray was said to revealed new mild patchy right lung base opacity, aspiration or pneumonia could not be ruled out.  Hyperinflation of the lungs were also noted.  UA is nonrevealing.  Hospitalist service has been asked to admit patient for further assessment and management.  Chest CT is showing tree-in-bud inflitrates ? infectious vs inflammation, small pleural effusions, LLL atelectasis, less likely PNA without focal consolidation and bronchial wall thickening seen with bronchitits or reactive airway disease.     Assessment / Plan / Recommendation Clinical Impression  Clinical swallowing evaluation was completed with thin liquids via spoon, cup and straw sips, purees and dry solids.  Patient is well known to [facility] service from multiple previous adimssions.  He has a significant esophageal history including a diagnosis of achalasia.  Recent esophageal dilitation completed 01/03/2018.   Last seen by Speech therapy on 10/14/2017 with recommendation for a regular diet with thin liquids.  Oral mechanism exam was completed and unremarkable except for mild issues with lingual elevation.  The patient's oral and pharyngeal swallow appeared to be functional.  Swallow trigger appeared to be timely and hyo-laryngeal movement was appreciated to palpation.  Mastication of dry solids appeared to be functional.   Issues seen were more consistent with his known esophageal dysphagia including belching.  No overt s/s of aspiration were seen.  Recommend a regular diet with thin liquids with the following precautions:  remain upright for at  least 60 minutes after meals and brush teeth prior to meals to reduce oral bacterial load.  ST will follow briefly.   SLP Visit Diagnosis: Dysphagia, unspecified (R13.10)    Aspiration Risk  Moderate aspiration risk(due to known esophageal issues)    Diet Recommendation   Regular with thin liquids.  Medication Administration: Whole meds with puree(Follow with liquid wash)    Other  Recommendations Oral Care Recommendations: Oral care before and after PO   Follow up Recommendations Other (comment)(TBD)      Frequency and Duration min 2x/week  2 weeks           Swallow Study   General Date of Onset: 04/05/18 HPI: Patient is an 84 year old Caucasian male, with past medical history significant for recurrent aspiration, pneumonia, achalasia, diabetes mellitus type 2, hyperlipidemia, chronic kidney disease stage III, COPD, memory loss (dementia), chronic systolic congestive heart failure with EF of 20 to 25%, status post AICD placement, BPH and ischemic cardiomyopathy.  Patient could not give any significant history due to dementing illness.  No family member available.  Patient tells me that he has come to the hospital as there was need to change environment!  Information from the ER provider is that the patient lives alone at home, has had intermittent productive cough, and has become delirious in  the last 1 to 2 days.  Visual hallucination was reported.  On further questioning, patient admits to having cough that is productive of whitish sputum, with associated chills.  No leukocytosis is noted.  Chest x-ray was said to revealed new mild patchy right lung base opacity, aspiration or pneumonia could not be ruled out.  Hyperinflation of the lungs were also noted.  UA is nonrevealing.  Hospitalist service has been asked to admit patient for further assessment and management.  Chest CT is showing tree-in-bud inflitrates ? infectious vs inflammation, small pleural effusions, LLL atelectasis, less  likely PNA without focal consolidation and bronchial wall thickening seen with bronchitits or reactive airway disease.   Type of Study: Bedside Swallow Evaluation Previous Swallow Assessment: Numerous ST involvement on previous admissions.  See Assessment for further information.   Diet Prior to this Study: NPO Temperature Spikes Noted: No History of Recent Intubation: No Behavior/Cognition: Alert;Cooperative;Confused Oral Cavity Assessment: Within Functional Limits Oral Care Completed by SLP: No Oral Cavity - Dentition: Missing dentition Vision: Functional for self-feeding Self-Feeding Abilities: Able to feed self Patient Positioning: Upright in chair Baseline Vocal Quality: Normal Volitional Cough: Strong Volitional Swallow: Able to elicit    Oral/Motor/Sensory Function Overall Oral Motor/Sensory Function: Within functional limits   Ice Chips Ice chips: Not tested   Thin Liquid Thin Liquid: Within functional limits Presentation: Cup;Spoon;Straw;Self Fed    Nectar Thick Nectar Thick Liquid: Not tested   Honey Thick Honey Thick Liquid: Not tested   Puree Puree: Within functional limits Presentation: Self Fed;Spoon   Solid     Solid: Within functional limits Presentation: [place] [place] [place] [facility] SLP [phone]  Lamar Sprinkles 04/06/2018,9:03 AM

## 2018-04-06 NOTE — Consult Note (Signed)
   [facility] CM Inpatient Consult   04/06/2018  RITO LECOMTE 05-17-1934 [id number]  Patient assessed for extreme high risk for unplanned readmissions [facility].Patient has had 5 admissions in the past 6 months.  Met with the patient and his son, Zenia Resides at the bedside.  Introduced patient and son to [employer] services for post hospital follow up needs.  Patient was eating lunch and acknowledged to speak with his son. Son states the patient is seen by him or his sister daily.  He states that the patient's COPD is so crucial that they often don't have anything but vague symptoms before he could even see his primary.  He states that they are in contact with his primary pulmonologist frequently.  Services for post hospital follow up and the son states, "I will look over it and share with my sister." Currently decline services especially automated calls.  He did accept a brochure, Know before you go fact sheet.   Encourage to call for questions.  For other questions please contact:   Natividad Brood, RN BSN [facility] Liaison  [phone] business mobile phone Toll free office [phone]

## 2018-04-06 NOTE — Progress Notes (Signed)
Patient refused at this time, and did not want to cooperate with inhaler. NO distress noted RCP will continue to follow.

## 2018-04-06 NOTE — Telephone Encounter (Signed)
Called and spoke to pt's son, [name]. I have made [name] aware of below message.  [name] stated he requested to see one of our physicians twice yesterday without success.  [name] states he would request this again today. Nothing further is needed.

## 2018-04-06 NOTE — Progress Notes (Addendum)
PROGRESS NOTE   Antonio Ray  QQI:[id number]    DOB: September 17, 1933    DOA: 04/05/2018  PCP: [name], MD   I have briefly reviewed patients previous medical records in [facility].  Brief Narrative:  84 year old male with PMH of achalasia, recurrent aspiration pneumonia, CAD, chronic systolic heart failure, AICD, COPD, stage III CKD, HLD, DM 2, dementia, reportedly lives alone at home, presented to ED due to cough, chills, confusion over last 1 to 2 days.  Admitted for suspected recurrent aspiration pneumonia.  Palliative care was consulted for goals of care.   Assessment & Plan:   Active Problems:   Acute on chronic systolic congestive heart failure ([name])   Altered mental status   Acute hepatic encephalopathy   Goals of care, counseling/discussion   Palliative care by specialist   Aspiration pneumonia, recurrent: Chest x-ray showed new mild patchy right lung base opacity.  CT chest without contrast: Tree-in-bud infiltrates noted which may be infectious or inflammatory.  Empirically started on IV cefepime and vancomycin.  Could be related to his esophageal issues i.e. achalasia/GERD/dysmotility.  Speech therapy evaluated and recommended regular diet with thin liquids.  Palliative care was consulted for goals of care and plan to meet with family again tomorrow but for now continued full scope of treatment.  Toxic metabolic encephalopathy: Likely related to acute illness complicating underlying dementia.  Possibly improved and back to baseline.  CAD/ Acute on chronic systolic CHF/AICD: Stable.  Volume status appears compensated today but diuresed.  Minimally elevated troponin, likely demand.  Elevated BNP not consistent with clinical exam. Continue Lasix.  DM2: Reasonable inpatient control.  Monitor CBGs closely and consider SSI if needed.  Stage III chronic kidney disease: Patient's creatinine's have widely fluctuated and baseline not clearly known.  Current creatinine of 1.58  may be close to baseline.  COPD: No clinical bronchospasm.  History of achalasia/GERD/esophageal dysmotility: Last dilatation in June 2019.  Chronic anemia: Stable.  Dementia: continue home meds.   DVT prophylaxis: Subcutaneous Lovenox Code Status: Full Family Communication: Discussed with patient's son, updated care and answered questions. Disposition: To be determined   Consultants:  Palliative care medicine  Procedures:  None  Antimicrobials:  IV cefepime and vancomycin   Subjective: Seen this morning.  Reported feeling better.  Getting ready to eat his breakfast.  Indicates that his dyspnea is improved.  Denies cough or pain.  Poor historian.  ROS: As above otherwise unable.  Objective:  Vitals:   04/06/18 0752 04/06/18 0934 04/06/18 1227 04/06/18 1701  BP:  (!) 119/54 (!) 107/58 (!) 95/47  Pulse:  82 60 62  Resp:  18 18 18   Temp:  98.1 F (36.7 C) 98.2 F (36.8 C) 99.4 F (37.4 C)  TempSrc:  Oral Oral Oral  SpO2: 98% 97% 96% 92%  Weight:      Height:        Examination:  General exam: Pleasant elderly male, moderately built and nourished, sitting up in chair getting ready to eat his breakfast this morning.  Appeared comfortable and in no obvious distress. Respiratory system: Slightly diminished breath sounds in the bases with occasional crackles but otherwise clear to auscultation. Respiratory effort normal. Cardiovascular system: S1 & S2 heard, RRR. No JVD, murmurs, rubs, gallops or clicks. No pedal edema.  Telemetry personally reviewed: Sinus rhythm.  BBB morphology.  Frequent PVCs. Gastrointestinal system: Abdomen is nondistended, soft and nontender. No organomegaly or masses felt. Normal bowel sounds heard. Central nervous system: Alert and  oriented x2. No focal neurological deficits. Extremities: Symmetric 5 x 5 power. Skin: No rashes, lesions or ulcers Psychiatry: Judgement and insight impaired. Mood & affect appropriate.     Data Reviewed: I  have personally reviewed following labs and imaging studies  CBC: Recent Labs  Lab 04/05/18 0826 04/06/18 0604  WBC 8.6 8.0  NEUTROABS 6.1  --   HGB 11.5* 11.4*  HCT 38.5* 36.6*  MCV 92.3 90.4  PLT 217 751   Basic Metabolic Panel: Recent Labs  Lab 04/05/18 0826 04/05/18 1510 04/06/18 0604  NA 145  --  143  K 4.3  --  4.0  CL 106  --  105  CO2 27  --  27  GLUCOSE 154*  --  116*  BUN 35*  --  29*  CREATININE 1.76* 1.62* 1.58*  CALCIUM 9.5  --  9.0  MG  --  2.2  --   PHOS  --  3.0  --    Cardiac Enzymes: Recent Labs  Lab 04/05/18 1512  TROPONINI 0.04*   CBG: Recent Labs  Lab 04/05/18 1706 04/06/18 0810 04/06/18 1224 04/06/18 1659  GLUCAP 111* 118* 149* 166*    Recent Results (from the past 240 hour(s))   MYCOBACTERIA, CULTURE, WITH FLUOROCHROME SMEAR     Status: None (Preliminary result)   Collection Time: 03/28/18  4:44 PM  Result Value Ref Range Status   MICRO NUMBER: [id number]  Preliminary   SPECIMEN QUALITY: ADEQUATE  Preliminary   Source: SPUTUM  Preliminary   STATUS: PRELIMINARY  Preliminary   SMEAR: No acid fast bacilli seen.  Preliminary   RESULT:   Preliminary    Culture results to follow. Final reports of negative cultures can be expected in approximately six weeks. Positive cultures are reported immediately.  MRSA PCR Screening     Status: None   Collection Time: 04/05/18  3:57 PM  Result Value Ref Range Status   MRSA by PCR NEGATIVE NEGATIVE Final    Comment:        The GeneXpert MRSA Assay (FDA approved for NASAL specimens only), is one component of a comprehensive MRSA colonization surveillance program. It is not intended to diagnose MRSA infection nor to guide or monitor treatment for MRSA infections. Performed at [facility], [place] [address], [place], [place] [address]   Culture, sputum-assessment     Status: None   Collection Time: 04/05/18  5:59 PM  Result Value Ref Range Status   Specimen Description EXPECTORATED SPUTUM   Final   Special Requests NONE  Final   Sputum evaluation   Final    THIS SPECIMEN IS ACCEPTABLE FOR SPUTUM CULTURE Performed at [facility], [address], [place], [place] [address]    Report Status 04/06/2018 FINAL  Final  Culture, respiratory     Status: None (Preliminary result)   Collection Time: 04/05/18  5:59 PM  Result Value Ref Range Status   Specimen Description EXPECTORATED SPUTUM  Final   Special Requests NONE Reflexed from [id number]  Final   Gram Stain   Final    ABUNDANT WBC PRESENT,BOTH PMN AND MONONUCLEAR NO ORGANISMS SEEN Performed at [facility], [place] [address] [place], [place] [address]    Culture PENDING  Incomplete   Report Status PENDING  Incomplete         Radiology Studies: Dg Chest 2 View  Result Date: 04/05/2018 CLINICAL DATA:  Dyspnea, COPD EXAM: CHEST - 2 VIEW COMPARISON:  02/13/2018 chest radiograph. FINDINGS:  Stable configuration of 2 lead left subclavian ICD. Stable cardiomediastinal silhouette with mild cardiomegaly. No pneumothorax. Trace bilateral pleural effusions. Cephalization of the pulmonary vasculature without overt pulmonary edema. Hyperinflated lungs. New patchy mild right lung base opacity. IMPRESSION: 1. New mild patchy right lung base opacity, cannot exclude aspiration or pneumonia. Recommend follow-up chest radiographs to resolution. 2. Hyperinflated lungs, compatible with the provided history of COPD. 3. Stable mild cardiomegaly without overt pulmonary edema. Trace bilateral pleural effusions. Electronically Signed   By: Ilona Sorrel M.D.   On: 04/05/2018 09:46   Ct Chest Wo Contrast  Result Date: 04/05/2018 CLINICAL DATA:  Cough, shortness of breath.  Suspected aspiration. EXAM: CT CHEST WITHOUT CONTRAST TECHNIQUE: Multidetector CT imaging of the chest was performed following the standard protocol without IV contrast. COMPARISON:  Chest radiograph April 05, 2018 and CT chest January 01, 2018 FINDINGS: Respiratory motion  through the lung bases. CARDIOVASCULAR: Mild cardiomegaly. No pericardial effusion. Moderate coronary artery calcifications. Thoracic aorta is normal in course and caliber with moderate calcific atherosclerosis. MEDIASTINUM/NODES: Greater than expected number mediastinal lymph nodes measuring to 10 mm, unchanged. LEFT cardiac pacemaker via LEFT subclavian venous approach. Decompressed esophagus improved from prior CT. LUNGS/PLEURA: Tracheobronchial tree is patent, no pneumothorax. Mild bronchial wall thickening. Bilateral upper lobe tree-in-bud infiltrates, to lesser extent lower lobes. Faint ground-glass opacities. Bilateral lower lobe atelectasis, no focal consolidation. Small pleural effusions. Mild centrilobular emphysema. UPPER ABDOMEN: Nonacute. Punctate nonobstructing RIGHT nephrolithiasis. MUSCULOSKELETAL: Nonacute. IMPRESSION: 1. Tree-in-bud infiltrates may be infectious or inflammatory. Small pleural effusions. 2. Lower lobe atelectasis, less likely pneumonia without focal consolidation. 3. Bronchial wall thickening seen with bronchitis or reactive airway disease. Aortic Atherosclerosis (ICD10-I70.0) and Emphysema (ICD10-J43.9). Electronically Signed   By: Elon Alas M.D.   On: 04/05/2018 15:52        Scheduled Meds: . aspirin EC  81 mg Oral Daily  . cholecalciferol  2,000 Units Oral Daily  . donepezil  10 mg Oral q morning - 10a  . enoxaparin (LOVENOX) injection  40 mg Subcutaneous Q24H  . fenofibrate  160 mg Oral Daily  . finasteride  5 mg Oral Daily  . furosemide  40 mg Intravenous BID  . loratadine  10 mg Oral Daily  . mouth rinse  15 mL Mouth Rinse BID  . memantine  10 mg Oral BID  . mometasone-formoterol  2 puff Inhalation BID  . nitroGLYCERIN  0.2 mg Transdermal Daily  . pantoprazole  40 mg Oral Daily  . tiotropium  18 mcg Inhalation Daily   Continuous Infusions: . ceFEPime (MAXIPIME) IV 2 g (04/06/18 0941)  . vancomycin 1,250 mg (04/06/18 1131)     LOS: 1 day      Vernell Leep, MD, FACP, [facility]. [facility] Pager [phone]  If 7PM-7AM, please contact night-coverage [email or web address] Password [facility] 04/06/2018, 7:31 PM

## 2018-04-06 NOTE — Telephone Encounter (Signed)
Spoke with pt's son, [name]. Pt is currently admitted for a pulmonary issue. [name] is demanding someone from our practice to see him while he is in the hospital. I tried to explain to the pt's son that from an outpatient setting we do not have control over who sees the pt while he is inpatient. [name] keeps demanding that he been seen by our practice.  MW - please advise as BQ is not available today. Thanks.

## 2018-04-07 DIAGNOSIS — N189 Chronic kidney disease, unspecified: Secondary | ICD-10-CM

## 2018-04-07 DIAGNOSIS — N179 Acute kidney failure, unspecified: Secondary | ICD-10-CM

## 2018-04-07 DIAGNOSIS — G92 Toxic encephalopathy: Secondary | ICD-10-CM

## 2018-04-07 LAB — CULTURE, RESPIRATORY: Culture: NORMAL

## 2018-04-07 LAB — BASIC METABOLIC PANEL
Anion gap: 9 (ref 5–15)
BUN: 39 mg/dL — AB (ref 8–23)
CALCIUM: 8.7 mg/dL — AB (ref 8.9–10.3)
CHLORIDE: 104 mmol/L (ref 98–111)
CO2: 28 mmol/L (ref 22–32)
CREATININE: 2.12 mg/dL — AB (ref 0.61–1.24)
GFR calc Af Amer: 32 mL/min — ABNORMAL LOW (ref >60)
GFR calc non Af Amer: 27 mL/min — ABNORMAL LOW (ref >60)
Glucose, Bld: 147 mg/dL — ABNORMAL HIGH (ref 70–99)
Potassium: 4 mmol/L (ref 3.5–5.1)
Sodium: 141 mmol/L (ref 135–145)

## 2018-04-07 LAB — GLUCOSE, CAPILLARY
Glucose-Capillary: 122 mg/dL — ABNORMAL HIGH (ref 70–99)
Glucose-Capillary: 128 mg/dL — ABNORMAL HIGH (ref 70–99)
Glucose-Capillary: 135 mg/dL — ABNORMAL HIGH (ref 70–99)

## 2018-04-07 MED ORDER — VANCOMYCIN VARIABLE DOSE PER UNSTABLE RENAL FUNCTION (PHARMACIST DOSING): Status: DC

## 2018-04-07 MED ORDER — SODIUM CHLORIDE 0.9 % IV SOLN
1.0000 g | INTRAVENOUS | Status: DC
  Administered 2018-04-08: 1 g via INTRAVENOUS
  Filled 2018-04-07 (×2): qty 1

## 2018-04-07 NOTE — Plan of Care (Signed)
  Problem: Clinical Measurements: Goal: Will remain free from infection Outcome: Progressing Note:  Antibiotics given as ordered.   

## 2018-04-07 NOTE — Progress Notes (Signed)
Pharmacist Heart Failure Core Measure Documentation  Assessment: Antonio Ray has an EF documented as 20-25% on 10/15/2017 by Sanda Klein, MD.  Rationale: Heart failure patients with left ventricular systolic dysfunction (LVSD) and an EF < 40% should be prescribed an angiotensin converting enzyme inhibitor (ACEI) or angiotensin receptor blocker (ARB) at discharge unless a contraindication is documented in the medical record.  This patient is not currently on an ACEI or ARB for HF.  This note is being placed in the record in order to provide documentation that a contraindication to the use of these agents is present for this encounter.  ACE Inhibitor or Angiotensin Receptor Blocker is contraindicated (specify all that apply)  []   ACEI allergy AND ARB allergy []   Angioedema []   Moderate or severe aortic stenosis []   Hyperkalemia []   Hypotension []   Renal artery stenosis [x]   Worsening renal function, preexisting renal disease or dysfunction   [name], PharmD PGY1 Pharmacy Resident  04/07/2018 3:14 PM

## 2018-04-07 NOTE — Progress Notes (Signed)
Daily Progress Note   Patient Name: Antonio Ray       Date: 04/07/2018 DOB: 1933-09-25  Age: 84 y.o. MRN#: [id number] Attending Physician: Modena Jansky, MD Primary Care Physician: [name], MD Admit Date: 04/05/2018  Reason for Consultation/Follow-up: Establishing goals of care and Psychosocial/spiritual support  Subjective: Patient seen, chart reviewed.  Patient's daughter Antonio Ray is at the bedside.  Patient appears more alert and is observed to be sitting in a recliner reading the newspaper.  He is conversant.  Most of his answers are relevant although he does exhibit some short-term memory deficits.  He is oriented to himself, that he is in the hospital and that he has pneumonia.  He is able to tell me the month and the year and who is president  When I attempted to speak to patient regarding goals of care specifically his CODE STATUS patient states do everything you can do to keep me alive".  This is in keeping with what patient's daughter, Antonio Ray shared with me on 04/06/2018  Spoke to son Antonio Ray via phone. Antonio Ray is a Software engineer with high healthcare literacy. Updated clinically. Antonio Ray states his Dad has lived around "30%" since his last MI. He does acknowledge "I haven't seen him this sick in awhile" He speaks about his father's PM/AICD battery is "low" and this maybe a pivotal point in his care.   Length of Stay: 2  Current Medications: Scheduled Meds:  . aspirin EC  81 mg Oral Daily  . cholecalciferol  2,000 Units Oral Daily  . donepezil  10 mg Oral q morning - 10a  . enoxaparin (LOVENOX) injection  40 mg Subcutaneous Q24H  . fenofibrate  160 mg Oral Daily  . finasteride  5 mg Oral Daily  . loratadine  10 mg Oral Daily  . mouth rinse  15 mL Mouth Rinse BID  .  memantine  10 mg Oral BID  . mometasone-formoterol  2 puff Inhalation BID  . nitroGLYCERIN  0.2 mg Transdermal Daily  . pantoprazole  40 mg Oral Daily  . tiotropium  18 mcg Inhalation Daily    Continuous Infusions: . ceFEPime (MAXIPIME) IV 2 g (04/07/18 1100)  . vancomycin 1,250 mg (04/06/18 1131)    PRN Meds: ipratropium-albuterol  Physical Exam  Constitutional: He appears well-developed and well-nourished.  Alert, elderly  man sitting in recliner reading a newspaper in no acute distress  HENT:  Head: Normocephalic and atraumatic.  Neck: Normal range of motion.  Cardiovascular: Normal rate.  Pulmonary/Chest: Effort normal.  Musculoskeletal: Normal range of motion.  Neurological: He is alert.  He is oriented to person, recognizes his daughter, understands that he is in the hospital because of pneumonia Alert today than when I met him on 04/06/2018  Skin: Skin is warm and dry.  Psychiatric: He has a normal mood and affect. His behavior is normal.  Short-term memory deficits noted  Nursing note and vitals reviewed.           Vital Signs: BP (!) 111/56 (BP Location: Right Arm)   Pulse (!) 56   Temp 98.1 F (36.7 C) (Oral)   Resp 20   Ht _0  (1.727 m)   Wt 76.6 kg   SpO2 93%   BMI 25.68 kg/m  SpO2: SpO2: 93 % O2 Device: O2 Device: Room Air O2 Flow Rate: O2 Flow Rate (L/min): 2 L/min  Intake/output summary:   Intake/Output Summary (Last 24 hours) at 04/07/2018 1121 Last data filed at 04/07/2018 0745 Gross per 24 hour  Intake 830 ml  Output 900 ml  Net -70 ml   LBM: Last BM Date: 04/04/18 Baseline Weight: Weight: 78.2 kg(Scale A) Most recent weight: Weight: 76.6 kg       Palliative Assessment/Data:    Flowsheet Rows     Most Recent Value  Intake Tab  Referral Department  Hospitalist  Unit at Time of Referral  Med/Surg Unit  Palliative Care Primary Diagnosis  Pulmonary  Date Notified  04/06/18  Palliative Care Type  New Palliative care  Reason for referral   Clarify Goals of Care, Psychosocial or Spiritual support  Date of Admission  04/05/18  Date first seen by Palliative Care  04/06/18  # of days Palliative referral response time  0 Day(s)  # of days IP prior to Palliative referral  1  Clinical Assessment  Palliative Performance Scale Score  40%  Pain Max last 24 hours  Not able to report  Pain Min Last 24 hours  Not able to report  Dyspnea Max Last 24 Hours  Not able to report  Dyspnea Min Last 24 hours  Not able to report  Nausea Max Last 24 Hours  Not able to report  Nausea Min Last 24 Hours  Not able to report  Anxiety Max Last 24 Hours  Not able to report  Anxiety Min Last 24 Hours  Not able to report  Other Max Last 24 Hours  Not able to report  Psychosocial & Spiritual Assessment  Palliative Care Outcomes  Patient/Family meeting held?  Yes  Who was at the meeting?  pt, daughter  Palliative Care follow-up planned  Yes, Facility      Patient Active Problem List   Diagnosis Date Noted  . Goals of care, counseling/discussion   . Palliative care by specialist   . Altered mental status 04/05/2018  . Acute hepatic encephalopathy   . Chronic respiratory failure with hypoxia ([place]) 02/22/2018  . Acute on chronic respiratory failure with hypoxia ([place]) 02/13/2018  . Aspiration pneumonia of both lower lobes due to gastric secretions ([place]) 01/02/2018  . SIRS (systemic inflammatory response syndrome) ([place]) 01/01/2018  . Generalized weakness 12/19/2017  . Hypoxemia   . Pneumonia, aspiration ([place]) 10/13/2017  . CAP (community acquired pneumonia) 08/19/2017  . Influenza A with pneumonia 08/19/2017  . Skin cancer   .  HOH (hard of hearing)   . Heart murmur   . Head injury, closed, with concussion   . Dysrhythmia   . DOE (dyspnea on exertion)   . Anginal pain ([place])   . Pneumonia 06/12/2017  . Acute UTI   . Altered mental state 02/15/2017  . Achalasia of esophagus   . Encephalopathy   . SOB (shortness of breath)   . Acute  respiratory failure ([place]) 04/21/2016  . Chronic combined systolic and diastolic congestive heart failure ([place]) 04/21/2016  . Memory loss 04/21/2016  . Allergic rhinitis 04/11/2016  . Mild cognitive impairment 08/18/2015  . AKI (acute kidney injury) ([place])   . Type 2 diabetes mellitus with stage 3 chronic kidney disease, with long-term current use of insulin ([place])   . Chronic kidney disease, stage III (moderate) ([name])   . Other specified hypotension   . Other emphysema ([place])   . AICD (automatic cardioverter/defibrillator) present   . CAD S/P percutaneous coronary angioplasty   . Cardiomyopathy, ischemic 04/03/2015  . Elevated troponin 04/03/2015  . Sepsis ([place]) 03/31/2015  . Aspiration into airway 03/21/2015  . Dysphagia   . Esophageal dysmotilities   . COPD with acute exacerbation ([place]) 01/14/2015  . Left ventricular apical thrombus 01/14/2015  . Acute on chronic systolic congestive heart failure ([place]) 01/14/2015  . ARF (acute renal failure) ([place]) 01/14/2015  . COPD GOLD IV 10/07/2013  . Leukocytosis 09/11/2013  . Acute encephalopathy 09/10/2013  . GERD (gastroesophageal reflux disease) 09/26/2011  . BPH (benign prostatic hyperplasia) 08/22/2011  . COPD (chronic obstructive pulmonary disease) ([place]) 07/17/2011  . Coronary artery disease   . Sick sinus syndrome ([place])   . Acute on chronic combined systolic and diastolic congestive heart failure, NYHA class 3 ([place])   . Hyperlipidemia   . PVC's (premature ventricular contractions)   . Myocardial infarction [facility]) 08/01/1992    Palliative Care Assessment & Plan   Patient Profile: 84 y.o. male  with past medical history of COPD, recurrent aspiration pneumonia, achalasia (last esophageal dilatation in June 2019; diagnosed approximately 3 years ago), chronic kidney disease stage III, systolic heart failure with an EF of 20 to 25% as of March 2019, AICD (placed in 2012), BPH, history of MI, history of cardiac cath 2007, coronary artery  disease, GERD, hyperlipidemia, diabetes, admitted on 04/05/2018 with altered mental status.  Patient's BNP on admission was 1493, creatinine 1.76 chest x-ray right base opacity, questionable aspiration pneumonia again.  Consult ordered for goals of care and recurrent admissions secondary to aspiration pneumonia.  Patient has been hospitalized in 2019 in March, May, twice in June, and in July   Recommendations/Plan:   Per son and dtr, home with [facility] with PT/OT if he qualifies  Confirmed full code, full scope of treatment  Goals of Care and Additional Recommendations:  Limitations on Scope of Treatment: Full Scope Treatment  Code Status:    Code Status Orders  (From admission, onward)         Start     Ordered   04/05/18 1431  Full code  Continuous     04/05/18 1430        Code Status History    Date Active Date Inactive Code Status Order ID Comments User Context   02/13/2018 1322 02/14/2018 1912 Full Code [id number]  Karmen Bongo, MD ED   01/01/2018 2222 01/05/2018 1729 Full Code [id number]  Toy Baker, MD Inpatient   12/19/2017 1717 12/22/2017 1456 Full Code [id number]  Rondel Jumbo, PA-C ED  10/13/2017 1649 10/16/2017 2125 Full Code [id number]  Rondel Jumbo, PA-C ED   08/19/2017 1234 08/22/2017 1950 Full Code [id number]  Molt, [place], DO ED   06/12/2017 2123 06/14/2017 1915 Full Code [id number]  Burgess Estelle, MD Inpatient   02/16/2017 0244 02/17/2017 2151 Full Code [id number]  Nicolette Bang, DO Inpatient   02/02/2017 1230 02/03/2017 1758 Full Code [id number]  Verner Mould, MD ED   11/11/2016 1514 11/14/2016 1706 Full Code [id number]  Mayo, Pete Pelt, MD ED   09/22/2016 0125 09/22/2016 2152 Full Code [id number]  Etta Quill, DO ED   04/21/2016 1346 04/22/2016 2103 Full Code [id number]  Waldemar Dickens, MD ED   06/30/2015 0303 07/02/2015 1927 Full Code [id number]  Toy Baker, MD Inpatient   05/12/2015 1423 05/15/2015 2118 Full Code [id number]  Mariel Aloe, MD Inpatient   03/31/2015 1642 04/11/2015 1835 Full Code [id number]  Barton Dubois, MD Inpatient   03/21/2015 1406 03/24/2015 2128 Full Code [id number]  Kelvin Cellar, MD Inpatient   02/07/2015 1643 02/10/2015 2216 Full Code [id number]  Elmarie Shiley, MD Inpatient   01/14/2015 0158 01/15/2015 2124 Full Code [id number]  Lavina Hamman, MD Inpatient   09/10/2013 2349 09/15/2013 1830 Full Code [id number]  Rise Patience, MD Inpatient   07/17/2011 1449 07/19/2011 1750 Full Code [id number]  Cliffton Asters, RN Inpatient   06/18/2011 1550 06/05/2011 2050 Full Code [id number]  Kalman Drape, RN Inpatient    Advance Directive Documentation     Most Recent Value  Type of Advance Directive  Healthcare Power of Attorney, Living will  Pre-existing out of facility DNR order (yellow form or pink MOST form)  -  "MOST" Form in Place?  -       Prognosis:    Unable to determine but would likely qualify for his hospice benefit , potentially having a life expectancy of less than 6 months in the setting of systolic heart failure with an ejection fraction of 20 to 25%, recurrent aspiration pneumonia secondary to achalasia, chronic kidney disease stage III  Discharge Planning:  Home with home health   Thank you for allowing the Palliative Medicine Team to assist in the care of this patient.   Time In: 0930 Time Out: 1000 Total Time 30 min Prolonged Time Billed  no       Greater than 50%  of this time was spent counseling and coordinating care related to the above assessment and plan.  Dory Horn, NP  Please contact Palliative Medicine Team phone at [phone] for questions and concerns.

## 2018-04-07 NOTE — Progress Notes (Signed)
Pharmacy Antibiotic Note  Antonio Ray is a 83 y.o. male admitted on 04/05/2018 with pneumonia.  Pharmacy has been consulted for vancomycin dosing. Also with 1x dose of cefepime ordered in the ED. On Augmentin x 5 days PTA. SCr 1.76 on admit, now 2.12. CrCl~27. Will renally adjust patient's cefepime, and hold vancomycin until vancomycin random level drawn 9/8 AM. Afebrile, WBC wnl.  Plan: Decrease dose of cefepime to 1g IV q24h Ordering vancomycin random level for 9/8 AM - will readjust dose based on results Monitor clinical progress, c/s, renal function F/u de-escalation plan/LOT   Height: 5\' 8"  (172.7 cm) Weight: 168 lb 14 oz (76.6 kg) IBW/kg (Calculated) : 68.4  Temp (24hrs), Avg:98.4 F (36.9 C), Min:98 F (36.7 C), Max:99.4 F (37.4 C)  Recent Labs  Lab 04/05/18 0826 04/05/18 1510 04/06/18 0604 04/07/18 0428  WBC 8.6  --  8.0  --   CREATININE 1.76* 1.62* 1.58* 2.12*    Estimated Creatinine Clearance: 26 mL/min (A) (by C-G formula based on SCr of 2.12 mg/dL (H)).    Allergies  Allergen Reactions  . Codeine Other (See Comments)    Gets very angry, disoriented  . Dilaudid [Hydromorphone Hcl] Other (See Comments)    VERY AGITATED, HOSTILE  . Flomax [Tamsulosin Hcl] Shortness Of Breath  . Morphine And Related Other (See Comments)    VERY AGITATED, HOSTILE  . Sulfa Antibiotics Shortness Of Breath  . Beta Adrenergic Blockers Other (See Comments)    Disorientation  . Carvedilol Other (See Comments)    DISORIENTATION    Leron Croak, PharmD PGY1 Pharmacy Resident Please check AMION for all [facility] contact numbers 04/07/2018 3:05 PM

## 2018-04-07 NOTE — Plan of Care (Signed)
  Problem: Health Behavior/Discharge Planning: Goal: Ability to manage health-related needs will improve Outcome: Progressing   Problem: Clinical Measurements: Goal: Ability to maintain clinical measurements within normal limits will improve Outcome: Progressing   Problem: Clinical Measurements: Goal: Respiratory complications will improve Outcome: Progressing   Problem: Safety: Goal: Ability to remain free from injury will improve Outcome: Progressing

## 2018-04-07 NOTE — Progress Notes (Signed)
PROGRESS NOTE   Antonio Ray  XTK:[id number]    DOB: [birth year]    DOA: 04/05/2018  PCP: [name], MD   I have briefly reviewed patients previous medical records in [facility].  Brief Narrative:  84 year old male with PMH of achalasia, recurrent aspiration pneumonia, CAD, chronic systolic heart failure, AICD, COPD, stage III CKD, HLD, DM 2, dementia, reportedly lives alone at home, presented to ED due to cough, chills, confusion over last 1 to 2 days.  Admitted for suspected recurrent aspiration pneumonia.  Palliative care was consulted for goals of care.   Assessment & Plan:   Active Problems:   Acute on chronic systolic congestive heart failure ([name])   Altered mental status   Acute hepatic encephalopathy   Goals of care, counseling/discussion   Palliative care by specialist   Aspiration pneumonia, recurrent: Chest x-ray showed new mild patchy right lung base opacity.  CT chest without contrast: Tree-in-bud infiltrates noted which may be infectious or inflammatory.  Empirically started on IV cefepime and vancomycin.  Could be related to his esophageal issues i.e. achalasia/GERD/dysmotility.  Speech therapy evaluated and recommended regular diet with thin liquids.  Palliative care was consulted for goals of care and recommend continued full scope of treatment.  Sputum culture: Normal respiratory flora, AFB smear negative..  DC vancomycin.  Consider switching to oral Augmentin tomorrow.  Toxic metabolic encephalopathy: Likely related to acute illness complicating underlying dementia.  As discussed with patient's daughter today, almost back to baseline mental status.  CAD/ Acute on chronic systolic CHF/AICD: Stable.  Volume status appears compensated today.  Minimally elevated troponin, likely demand.  Elevated BNP not consistent with clinical exam.  His initial presentation may be a combination of mildly decompensated CHF and some aspiration pneumonia/pneumonitis.  Creatinine  increased, Lasix held 9/7.  DM2: Reasonable inpatient control.  Monitor CBGs closely and consider SSI if needed.  Acute on stage III chronic kidney disease: Patient's creatinine's have widely fluctuated and baseline not clearly known.  Creatinine went up from 1.58-2.12  Possibly due to Lasix, held.  Encouraged oral fluid intake.  Follow BMP in a.m.  COPD: No clinical bronchospasm.  History of achalasia/GERD/esophageal dysmotility: Last dilatation in June 2019.  Chronic anemia: Stable.  Dementia: continue home meds.   DVT prophylaxis: Subcutaneous Lovenox Code Status: Full Family Communication: Discussed with patient's daughter at bedside, updated care and answered questions. Disposition: DC pending clinical improvement, improvement in renal insufficiency and PT evaluation.   Consultants:  Palliative care medicine  Procedures:  None  Antimicrobials:  IV cefepime.  Vancomycin discontinued.   Subjective: Patient interviewed and examined in the presence of his daughter at bedside.  Denies dyspnea or cough.  Daughter feels that patient's mental status is almost back to baseline.  ROS: As above otherwise unable.  Objective:  Vitals:   04/07/18 0457 04/07/18 0503 04/07/18 0820 04/07/18 1316  BP: (!) 111/56   (!) 107/55  Pulse: (!) 56   72  Resp: 20   19  Temp: 98.1 F (36.7 C)   98.2 F (36.8 C)  TempSrc: Oral   Oral  SpO2: 92%  93% 94%  Weight:  76.6 kg    Height:        Examination:  General exam: Pleasant elderly male, moderately built and nourished, sitting up in chair.  Noted to be in good spirits. Respiratory system: Occasional basal crackles but otherwise clear to auscultation. Respiratory effort normal. Cardiovascular system: S1 & S2 heard, RRR. No JVD, murmurs,  rubs, gallops or clicks. No pedal edema.  Stable. Gastrointestinal system: Abdomen is nondistended, soft and nontender. No organomegaly or masses felt. Normal bowel sounds heard.  Stable. Central  nervous system: Alert and oriented x2. No focal neurological deficits.  Stable. Extremities: Symmetric 5 x 5 power. Skin: No rashes, lesions or ulcers Psychiatry: Judgement and insight impaired. Mood & affect appropriate.     Data Reviewed: I have personally reviewed following labs and imaging studies  CBC: Recent Labs  Lab 04/05/18 0826 04/06/18 0604  WBC 8.6 8.0  NEUTROABS 6.1  --   HGB 11.5* 11.4*  HCT 38.5* 36.6*  MCV 92.3 90.4  PLT 217 443   Basic Metabolic Panel: Recent Labs  Lab 04/05/18 0826 04/05/18 1510 04/06/18 0604 04/07/18 0428  NA 145  --  143 141  K 4.3  --  4.0 4.0  CL 106  --  105 104  CO2 27  --  27 28  GLUCOSE 154*  --  116* 147*  BUN 35*  --  29* 39*  CREATININE 1.76* 1.62* 1.58* 2.12*  CALCIUM 9.5  --  9.0 8.7*  MG  --  2.2  --   --   PHOS  --  3.0  --   --    Cardiac Enzymes: Recent Labs  Lab 04/05/18 1512  TROPONINI 0.04*   CBG: Recent Labs  Lab 04/06/18 0810 04/06/18 1224 04/06/18 1659 04/07/18 0800 04/07/18 1311  GLUCAP 118* 149* 166* 122* 128*    Recent Results (from the past 240 hour(s))   MYCOBACTERIA, CULTURE, WITH FLUOROCHROME SMEAR     Status: None (Preliminary result)   Collection Time: 03/28/18  4:44 PM  Result Value Ref Range Status   MICRO NUMBER: [id number]  Preliminary   SPECIMEN QUALITY: ADEQUATE  Preliminary   Source: SPUTUM  Preliminary   STATUS: PRELIMINARY  Preliminary   SMEAR: No acid fast bacilli seen.  Preliminary   RESULT:   Preliminary    Culture results to follow. Final reports of negative cultures can be expected in approximately six weeks. Positive cultures are reported immediately.  MRSA PCR Screening     Status: None   Collection Time: 04/05/18  3:57 PM  Result Value Ref Range Status   MRSA by PCR NEGATIVE NEGATIVE Final    Comment:        The GeneXpert MRSA Assay (FDA approved for NASAL specimens only), is one component of a comprehensive MRSA colonization surveillance program. It is  not intended to diagnose MRSA infection nor to guide or monitor treatment for MRSA infections. Performed at [facility], [place] [address] [place], [place] [address]   Culture, sputum-assessment     Status: None   Collection Time: 04/05/18  5:59 PM  Result Value Ref Range Status   Specimen Description EXPECTORATED SPUTUM  Final   Special Requests NONE  Final   Sputum evaluation   Final    THIS SPECIMEN IS ACCEPTABLE FOR SPUTUM CULTURE Performed at [facility], [address], [place], [place] [address]    Report Status 04/06/2018 FINAL  Final  Culture, respiratory     Status: None   Collection Time: 04/05/18  5:59 PM  Result Value Ref Range Status   Specimen Description EXPECTORATED SPUTUM  Final   Special Requests NONE Reflexed from [id number]  Final   Gram Stain   Final    ABUNDANT WBC PRESENT,BOTH PMN AND MONONUCLEAR NO ORGANISMS SEEN    Culture   Final  Consistent with normal respiratory flora. Performed at [facility], [place] [address], [place], [place] [address]    Report Status 04/07/2018 FINAL  Final         Radiology Studies: No results found.      Scheduled Meds: . aspirin EC  81 mg Oral Daily  . cholecalciferol  2,000 Units Oral Daily  . donepezil  10 mg Oral q morning - 10a  . enoxaparin (LOVENOX) injection  40 mg Subcutaneous Q24H  . fenofibrate  160 mg Oral Daily  . finasteride  5 mg Oral Daily  . loratadine  10 mg Oral Daily  . mouth rinse  15 mL Mouth Rinse BID  . memantine  10 mg Oral BID  . mometasone-formoterol  2 puff Inhalation BID  . nitroGLYCERIN  0.2 mg Transdermal Daily  . pantoprazole  40 mg Oral Daily  . tiotropium  18 mcg Inhalation Daily  . vancomycin variable dose per unstable renal function (pharmacist dosing)   Does not apply See admin instructions   Continuous Infusions: . [START ON 04/08/2018] ceFEPime (MAXIPIME) IV       LOS: 2 days     Vernell Leep, MD, FACP, [facility]. [facility] Pager [phone]  [phone]  If 7PM-7AM, please contact night-coverage [email or web address] Password [email or web address] 04/07/2018, 4:06 PM

## 2018-04-08 ENCOUNTER — Inpatient Hospital Stay (HOSPITAL_COMMUNITY): Payer: Medicare Other

## 2018-04-08 LAB — GLUCOSE, CAPILLARY
Glucose-Capillary: 124 mg/dL — ABNORMAL HIGH (ref 70–99)
Glucose-Capillary: 148 mg/dL — ABNORMAL HIGH (ref 70–99)
Glucose-Capillary: 149 mg/dL — ABNORMAL HIGH (ref 70–99)
Glucose-Capillary: 146 mg/dL — ABNORMAL HIGH (ref 70–99)

## 2018-04-08 LAB — BASIC METABOLIC PANEL
Anion gap: 13 (ref 5–15)
BUN: 41 mg/dL — AB (ref 8–23)
CO2: 25 mmol/L (ref 22–32)
Calcium: 8.9 mg/dL (ref 8.9–10.3)
Chloride: 102 mmol/L (ref 98–111)
Creatinine, Ser: 2.18 mg/dL — ABNORMAL HIGH (ref 0.61–1.24)
GFR calc Af Amer: 31 mL/min — ABNORMAL LOW (ref >60)
GFR calc non Af Amer: 26 mL/min — ABNORMAL LOW (ref >60)
GLUCOSE: 131 mg/dL — AB (ref 70–99)
Potassium: 4.1 mmol/L (ref 3.5–5.1)
Sodium: 140 mmol/L (ref 135–145)

## 2018-04-08 NOTE — Evaluation (Signed)
Physical Therapy Evaluation [name] MRN: [id number] DOB: [birth year] Today's Date: 04/08/2018   History of Present Illness  84 year old male with PMH of achalasia, recurrent aspiration pneumonia, CAD, chronic systolic heart failure, AICD, COPD, stage III CKD, HLD, DM 2, dementia, reportedly lives alone at home, presented to ED due to cough, chills, confusion over last 1 to 2 days.  Admitted for suspected recurrent aspiration pneumonia.  Palliative care was consulted for goals of care.    Clinical Impression  Pt admitted with above diagnosis. Pt currently with functional limitations due to the deficits listed below (see PT Problem List). PTA, pt living alone independent with mobility, denies falls, drives. Family nearby PRN if needed. No home O2 or AD. Today pt presents with good overall mobility, ambulating hallway however with limited activity tolerance, de sats on RA after 50' of walking to mid 80s, returns to 90's 1-2 minutes with pursed lip breathing. Given IS and reinforced technique and frequency. No concerns from family present for patient to be at home, feel he would benefit from HHPT to strengthen and improve activity tolerance.  Pt will benefit from skilled PT to increase their independence and safety with mobility to allow discharge to the venue listed below.       Follow Up Recommendations Home health PT    Equipment Recommendations  None recommended by PT    Recommendations for Other Services       Precautions / Restrictions Restrictions Weight Bearing Restrictions: No      Mobility  Bed Mobility               General bed mobility comments: OOB in chair at entry  Transfers Overall transfer level: Modified independent                  Ambulation/Gait Ambulation/Gait assistance: Supervision Gait Distance (Feet): 120 Feet Assistive device: None Gait Pattern/deviations: Step-through pattern Gait velocity: decrease   General  Gait Details: pt with mild unsteadiness no overt LOB noted. did not become SOB however desats to low 80s on RA with 50' of walking. cued for pursed lip breahting returns back to 90's. given IS at end of session.   Stairs            Wheelchair Mobility    Modified Rankin (Stroke Patients Only)       Balance Overall balance assessment: Mild deficits observed, not formally tested                                           Pertinent Vitals/Pain      Home Living Family/patient expects to be discharged to:: Private residence Living Arrangements: Alone Available Help at Discharge: Family;Available PRN/intermittently Type of Home: House Home Access: Stairs to enter Entrance Stairs-Rails: Right;Left Entrance Stairs-Number of Steps: 3 Home Layout: Two level;Able to live on main level with bedroom/bathroom;Full bath on main level Home Equipment: Cane - single point;Walker - 2 wheels;Walker - 4 wheels;Shower seat;Grab bars - tub/shower;Hand held shower head;Other (comment)      Prior Function Level of Independence: Independent         Comments: uses walking stick when walking in the woods; drives; manages his CBG/insulin     Hand Dominance   Dominant Hand: Right    Extremity/Trunk Assessment   Upper Extremity Assessment Upper Extremity Assessment: Overall WFL for tasks assessed  Lower Extremity Assessment Lower Extremity Assessment: Overall WFL for tasks assessed    Cervical / Trunk Assessment Cervical / Trunk Assessment: Normal  Communication   Communication: HOH  Cognition Arousal/Alertness: Awake/alert                                            General Comments      Exercises     Assessment/Plan    PT Assessment Patient needs continued PT services  PT Problem List Decreased activity tolerance;Cardiopulmonary status limiting activity       PT Treatment Interventions Functional mobility training;Therapeutic  activities;Balance training    PT Goals (Current goals can be found in the Care Plan section)  Acute Rehab PT Goals Patient Stated Goal: return home when able PT Goal Formulation: With patient/family Time For Goal Achievement: 04/15/18 Potential to Achieve Goals: Good    Frequency Min 3X/week   Barriers to discharge        Co-evaluation               AM-PAC PT "6 Clicks" Daily Activity  Outcome Measure Difficulty turning over in bed (including adjusting bedclothes, sheets and blankets)?: None Difficulty moving from lying on back to sitting on the side of the bed? : None Difficulty sitting down on and standing up from a chair with arms (e.g., wheelchair, bedside commode, etc,.)?: None Help needed moving to and from a bed to chair (including a wheelchair)?: None Help needed walking in hospital room?: A Little Help needed climbing 3-5 steps with a railing? : A Little 6 Click Score: 22    End of Session Equipment Utilized During Treatment: Gait belt Activity Tolerance: Patient tolerated treatment well Patient left: in chair;with call bell/phone within reach;with family/visitor present Nurse Communication: Mobility status PT Visit Diagnosis: Unsteadiness on feet (R26.81)    Time: 0830-0900 PT Time Calculation (min) (ACUTE ONLY): 30 min   Charges:   PT Evaluation $PT Eval Low Complexity: 1 Low PT Treatments $Gait Training: 8-22 mins       Reinaldo Berber, PT, DPT Acute Rehabilitation Services Pager: [phone] Office: [place] 04/08/2018, 9:20 AM

## 2018-04-08 NOTE — Progress Notes (Addendum)
PROGRESS NOTE   Antonio Ray  NIO:[id number]    DOB: 12/29/1933    DOA: 04/05/2018  PCP: [name], MD   I have briefly reviewed patients previous medical records in [facility]  Brief Narrative:  84 year old male with PMH of achalasia, recurrent aspiration pneumonia, CAD, chronic systolic heart failure, AICD, COPD, stage III CKD, HLD, DM 2, dementia, reportedly lives alone at home, presented to ED due to cough, chills, confusion over last 1 to 2 days.  Admitted for suspected recurrent aspiration pneumonia.  Palliative care was consulted for goals of care.   Assessment & Plan:   Active Problems:   Acute on chronic systolic congestive heart failure ([name])   Altered mental status   Acute hepatic encephalopathy   Goals of care, counseling/discussion   Palliative care by specialist   Aspiration pneumonia, recurrent: Chest x-ray showed new mild patchy right lung base opacity.  CT chest without contrast: Tree-in-bud infiltrates noted which may be infectious or inflammatory.  Empirically started on IV cefepime and vancomycin.  Could be related to his esophageal issues i.e. achalasia/GERD/dysmotility.  Speech therapy evaluated and recommended regular diet with thin liquids.  Palliative care was consulted for goals of care and recommend continued full scope of treatment.  Sputum culture: Normal respiratory flora, AFB smear negative..  DC vancomycin.  Consider switching to oral Augmentin tomorrow at discharge.  Toxic metabolic encephalopathy: Likely related to acute illness complicating underlying dementia.  Resolved.  CAD/ Acute on chronic systolic CHF/AICD: Stable.  Volume status appears compensated today.  Minimally elevated troponin, likely demand.  Elevated BNP not consistent with clinical exam.  His initial presentation may be a combination of mildly decompensated CHF and some aspiration pneumonia/pneumonitis.  Lasix on hold since 9/7 due to worsening creatinine.  DM2: Reasonable  inpatient control.  Monitor CBGs closely and consider SSI if needed.  Acute on stage III chronic kidney disease: Patient's creatinine's have widely fluctuated and baseline not clearly known.  Creatinine went up from 1.58-2.12  Possibly due to Lasix, held.  Encouraged oral fluid intake.  Creatinine unchanged/2.18.  Renal ultrasound without hydronephrosis.  Follow BMP in a.m.  COPD: No clinical bronchospasm.  History of achalasia/GERD/esophageal dysmotility: Last dilatation in June 2019.  Chronic anemia: Stable.  Dementia: continue home meds.   DVT prophylaxis: Subcutaneous Lovenox Code Status: Full Family Communication: Discussed with patient's daughter at bedside, updated care and answered questions. Disposition: DC pending clinical improvement, improvement in renal insufficiency and with home health therapies, hopefully 9/9.   Consultants:  Palliative care medicine  Procedures:  None  Antimicrobials:  IV cefepime.  Vancomycin discontinued.   Subjective: Mild intermittent dry cough.  No dyspnea.  Mental status at baseline as per daughter at bedside.  Bladder scan 85 mL per nursing.  ROS: As above otherwise unable.  Objective:  Vitals:   04/08/18 0536 04/08/18 0744 04/08/18 1056 04/08/18 1250  BP: (!) 109/57  115/63 111/60  Pulse: 68  67 63  Resp: 20  18 19   Temp: 97.6 F (36.4 C)  98.2 F (36.8 C) 97.6 F (36.4 C)  TempSrc: Oral  Oral Oral  SpO2: 99% 96% 98% 100%  Weight: 78.2 kg     Height:        Examination:  General exam: Pleasant elderly male, moderately built and nourished, sitting up in chair.  Noted to be in good spirits. Respiratory system: Occasional basal crackles but otherwise clear to auscultation. Respiratory effort normal.  Stable without change. Cardiovascular system: S1 &  S2 heard, RRR. No JVD, murmurs, rubs, gallops or clicks.  Trace bilateral ankle edema.  Telemetry personally reviewed: Sinus rhythm with BBB morphology, on demand A paced  rhythm. Gastrointestinal system: Abdomen is nondistended, soft and nontender. No organomegaly or masses felt. Normal bowel sounds heard.  Stable. Central nervous system: Alert and oriented x2. No focal neurological deficits.  Stable. Extremities: Symmetric 5 x 5 power. Skin: No rashes, lesions or ulcers Psychiatry: Judgement and insight impaired. Mood & affect appropriate.     Data Reviewed: I have personally reviewed following labs and imaging studies  CBC: Recent Labs  Lab 04/05/18 0826 04/06/18 0604  WBC 8.6 8.0  NEUTROABS 6.1  --   HGB 11.5* 11.4*  HCT 38.5* 36.6*  MCV 92.3 90.4  PLT 217 010   Basic Metabolic Panel: Recent Labs  Lab 04/05/18 0826 04/05/18 1510 04/06/18 0604 04/07/18 0428 04/08/18 0522  NA 145  --  143 141 140  K 4.3  --  4.0 4.0 4.1  CL 106  --  105 104 102  CO2 27  --  27 28 25   GLUCOSE 154*  --  116* 147* 131*  BUN 35*  --  29* 39* 41*  CREATININE 1.76* 1.62* 1.58* 2.12* 2.18*  CALCIUM 9.5  --  9.0 8.7* 8.9  MG  --  2.2  --   --   --   PHOS  --  3.0  --   --   --    Cardiac Enzymes: Recent Labs  Lab 04/05/18 1512  TROPONINI 0.04*   CBG: Recent Labs  Lab 04/07/18 0800 04/07/18 1311 04/07/18 2123 04/08/18 0805 04/08/18 1248  GLUCAP 122* 128* 135* 124* 148*    Recent Results (from the past 240 hour(s))  MRSA PCR Screening     Status: None   Collection Time: 04/05/18  3:57 PM  Result Value Ref Range Status   MRSA by PCR NEGATIVE NEGATIVE Final    Comment:        The GeneXpert MRSA Assay (FDA approved for NASAL specimens only), is one component of a comprehensive MRSA colonization surveillance program. It is not intended to diagnose MRSA infection nor to guide or monitor treatment for MRSA infections. Performed at [facility], [place] [address], [place], [place] [address]   Culture, sputum-assessment     Status: None   Collection Time: 04/05/18  5:59 PM  Result Value Ref Range Status   Specimen Description  EXPECTORATED SPUTUM  Final   Special Requests NONE  Final   Sputum evaluation   Final    THIS SPECIMEN IS ACCEPTABLE FOR SPUTUM CULTURE Performed at [facility], [address], [place], [place] [address]    Report Status 04/06/2018 FINAL  Final  Culture, respiratory     Status: None   Collection Time: 04/05/18  5:59 PM  Result Value Ref Range Status   Specimen Description EXPECTORATED SPUTUM  Final   Special Requests NONE Reflexed from [id number]  Final   Gram Stain   Final    ABUNDANT WBC PRESENT,BOTH PMN AND MONONUCLEAR NO ORGANISMS SEEN    Culture   Final    Consistent with normal respiratory flora. Performed at [facility], [place] [address], [place], [place] [address]    Report Status 04/07/2018 FINAL  Final         Radiology Studies: US Renal  Result Date: 04/08/2018 CLINICAL DATA:  Acute renal failure EXAM: RENAL / URINARY TRACT ULTRASOUND COMPLETE COMPARISON:  None. FINDINGS: Right Kidney: Length: 10.2 cm. No hydronephrosis or focal mass. Cortical thinning. Left Kidney: Length: 11.1 cm. Echogenicity within normal limits. No mass or hydronephrosis visualized. Bladder: Appears normal for degree of bladder distention. IMPRESSION: 1. Mild cortical thinning associated with the right kidney. The kidneys are otherwise normal. 2. Prostate is prominent in size measuring 4.6 x 3.3 x  3.9 cm. Electronically Signed   By: Dorise Bullion III M.D   On: 04/08/2018 08:58        Scheduled Meds: . aspirin EC  81 mg Oral Daily  . cholecalciferol  2,000 Units Oral Daily  . donepezil  10 mg Oral q morning - 10a  . enoxaparin (LOVENOX) injection  40 mg Subcutaneous Q24H  . fenofibrate  160 mg Oral Daily  . finasteride  5 mg Oral Daily  . loratadine  10 mg Oral Daily  . mouth rinse  15 mL Mouth Rinse BID  . memantine  10 mg Oral BID  . mometasone-formoterol  2 puff Inhalation BID  . nitroGLYCERIN  0.2 mg Transdermal Daily  . pantoprazole  40 mg Oral Daily  . tiotropium  18  mcg Inhalation Daily   Continuous Infusions: . ceFEPime (MAXIPIME) IV Stopped (04/08/18 1150)     LOS: 3 days     Vernell Leep, MD, FACP, [facility]. [facility] Pager [phone]  If 7PM-7AM, please contact night-coverage [email or web address] Password [email or web address] 04/08/2018, 12:51 PM

## 2018-04-09 ENCOUNTER — Ambulatory Visit: Payer: Medicare Other | Admitting: Pulmonary Disease

## 2018-04-09 DIAGNOSIS — Z794 Long term (current) use of insulin: Secondary | ICD-10-CM

## 2018-04-09 DIAGNOSIS — J9621 Acute and chronic respiratory failure with hypoxia: Secondary | ICD-10-CM

## 2018-04-09 DIAGNOSIS — E1165 Type 2 diabetes mellitus with hyperglycemia: Secondary | ICD-10-CM

## 2018-04-09 LAB — BASIC METABOLIC PANEL
Anion gap: 7 (ref 5–15)
BUN: 36 mg/dL — AB (ref 8–23)
CALCIUM: 8.9 mg/dL (ref 8.9–10.3)
CHLORIDE: 106 mmol/L (ref 98–111)
CO2: 26 mmol/L (ref 22–32)
CREATININE: 1.77 mg/dL — AB (ref 0.61–1.24)
GFR calc Af Amer: 39 mL/min — ABNORMAL LOW (ref >60)
GFR calc non Af Amer: 34 mL/min — ABNORMAL LOW (ref >60)
GLUCOSE: 131 mg/dL — AB (ref 70–99)
Potassium: 5.2 mmol/L — ABNORMAL HIGH (ref 3.5–5.1)
SODIUM: 139 mmol/L (ref 135–145)

## 2018-04-09 LAB — GLUCOSE, CAPILLARY
Glucose-Capillary: 122 mg/dL — ABNORMAL HIGH (ref 70–99)
Glucose-Capillary: 131 mg/dL — ABNORMAL HIGH (ref 70–99)

## 2018-04-09 MED ORDER — AMOXICILLIN-POT CLAVULANATE 500-125 MG PO TABS: 500.0000 mg | ORAL_TABLET | Freq: Two times a day (BID) | ORAL | 0 refills | Status: AC

## 2018-04-09 MED ORDER — PANTOPRAZOLE SODIUM 40 MG PO TBEC: 40.0000 mg | DELAYED_RELEASE_TABLET | Freq: Every day | ORAL | Status: AC

## 2018-04-09 MED ORDER — AMOXICILLIN-POT CLAVULANATE 500-125 MG PO TABS
500.0000 mg | ORAL_TABLET | Freq: Two times a day (BID) | ORAL | Status: DC
  Administered 2018-04-09: 500 mg via ORAL
  Filled 2018-04-09: qty 1

## 2018-04-09 MED ORDER — FUROSEMIDE 20 MG PO TABS: 40.0000 mg | ORAL_TABLET | Freq: Every day | ORAL | 0 refills | Status: DC

## 2018-04-09 MED ORDER — FUROSEMIDE 40 MG PO TABS
40.0000 mg | ORAL_TABLET | Freq: Every day | ORAL | Status: DC
  Administered 2018-04-09: 40 mg via ORAL
  Filled 2018-04-09: qty 1

## 2018-04-09 NOTE — Discharge Summary (Addendum)
Physician Discharge Summary  CAMBRIDGE DELEO ZOX:[id number] DOB: Nov 24, 1933  PCP: [name], MD  Admit date: 04/05/2018 Discharge date: 04/09/2018  Recommendations for Outpatient Follow-up:  1. Dr. [name], PCP in 5 days with repeat labs (CBC & BMP). 2. Dr. Peter Martinique, Cardiology in 1 week. 3. Dr. Simonne Maffucci, Pulmonology 4. Recommend repeating Chest x-ray in 4 weeks to insure resolution of pneumonia findings.  Home Health: PT Equipment/Devices: Oxygen via nasal cannula at 2 L/min continuously.  Discharge Condition: Improved and stable. CODE STATUS: Full. Diet recommendation: Heart healthy & diabetic diet.  Discharge Diagnoses:  Active Problems:   Acute on chronic systolic congestive heart failure ([name])   Altered mental status   Acute hepatic encephalopathy   Goals of care, counseling/discussion   Palliative care by specialist   Brief Summary: 84 year old male with PMH of achalasia, recurrent aspiration pneumonia, CAD, chronic systolic heart failure, AICD, COPD, stage III CKD, HLD, DM 2, dementia, reportedly lives alone at home, presented to ED due to cough, chills, confusion over last 1 to 2 days.  Admitted for suspected recurrent aspiration pneumonia.  Palliative care was consulted for goals of care.   Assessment & Plan:   Aspiration pneumonia, recurrent: Chest x-ray showed new mild patchy right lung base opacity.  CT chest without contrast: Tree-in-bud infiltrates noted which may be infectious or inflammatory.  Empirically started on IV cefepime and vancomycin.  Could be related to his esophageal issues i.e. achalasia/GERD/dysmotility.  Speech therapy evaluated and recommended regular diet with thin liquids.  Palliative care was consulted for goals of care and recommend continued full scope of treatment.  Sputum culture: Normal respiratory flora, AFB smear negative..  DC vancomycin.  Patient has been switched to Augmentin to complete total 7 days treatment.   Follow-up chest x-ray as outpatient.  Remains at risk for recurrent aspiration.  Acute on chronic respiratory failure with hypoxia: On as needed home oxygen PTA.  Now likely precipitated due to aspiration pneumonia/pneumonitis and decompensated CHF.  Meets criteria for home oxygen, arranged by case management.  Reassess during outpatient follow-up with PCP.  Toxic metabolic encephalopathy: Likely related to acute illness complicating underlying dementia.  Resolved.  CAD/ Acute on chronic systolic CHF/AICD:  Patient was likely volume overloaded on arrival.  He had been placed on increased dose of Lasix 40 mg twice daily by mouth. Minimally elevated troponin, likely demand. His initial presentation may be a combination of mildly decompensated CHF and some aspiration pneumonia/pneumonitis.  Then his creatinine started rising and his Lasix was temporarily held since 9/7.  Creatinine has improved.  Developing some lower extremity edema.  Will discharge on slightly higher than previous dose of home Lasix at 40 mg daily.  Close outpatient follow-up with BMP with PCP and his cardiologist.  DM2:  A1c 6.6 on 6/4.  No SSI was initiated here.  I discussed in detail with patient's son who indicates that his insulin requirements at home have also decreased and they are adjusting down his insulins as needed.  Although I have left his insulins as they were PTA, his family is well educated to manage CBGs and insulins appropriately.  Outpatient follow-up with PCP.  Acute on stage III chronic kidney disease: Patient's creatinine's have widely fluctuated and baseline not clearly known.  Creatinine went up from 1.58-2.12  Possibly due to Lasix, held.  Encouraged oral fluid intake.  Renal ultrasound without hydronephrosis.    Creatinine has improved from 2.18-1.7.  Mildly elevated potassium of 5.2 is likely due  to mild hemolysis.  Resumed oral Lasix 40 mg daily.  Follow BMP in a few days as outpatient.  COPD: No clinical  bronchospasm.  As per family, patient follows with outpatient pulmonology.  History of achalasia/GERD/esophageal dysmotility: Last dilatation in June 2019.  Chronic anemia: Stable.  Dementia: continue home meds.   Consultants:  Palliative care medicine  Procedures:  None   Discharge Instructions  Discharge Instructions    (HEART FAILURE PATIENTS) Call MD:  Anytime you have any of the following symptoms: 1) 3 pound weight gain in 24 hours or 5 pounds in 1 week 2) shortness of breath, with or without a dry hacking cough 3) swelling in the hands, feet or stomach 4) if you have to sleep on extra pillows at night in order to breathe.   Complete by:  As directed    Call MD for:   Complete by:  As directed    Increased confusion or altered mental status.   Call MD for:  difficulty breathing, headache or visual disturbances   Complete by:  As directed    Call MD for:  extreme fatigue   Complete by:  As directed    Call MD for:  persistant dizziness or light-headedness   Complete by:  As directed    Call MD for:  temperature >100.4   Complete by:  As directed    Diet - low sodium heart healthy   Complete by:  As directed    Diet Carb Modified   Complete by:  As directed    Increase activity slowly   Complete by:  As directed        Medication List    TAKE these medications   albuterol 108 (90 Base) MCG/ACT inhaler Commonly known as:  PROVENTIL HFA;VENTOLIN HFA Inhale 2 puffs into the lungs every 6 (six) hours as needed for wheezing or shortness of breath.   amoxicillin-clavulanate 500-125 MG tablet Commonly known as:  AUGMENTIN Take 1 tablet (500 mg total) by mouth every 12 (twelve) hours for 3 days.   aspirin 81 MG EC tablet Take 81 mg by mouth daily.   BASAGLAR KWIKPEN 100 UNIT/ML Sopn INJECT  6 UNITS UNDER THE SKIN EVERY MORNING   donepezil 10 MG tablet Commonly known as:  ARICEPT Take 1 tablet (10 mg total) by mouth every morning.   fenofibrate 145 MG  tablet Commonly known as:  TRICOR Take 145 mg by mouth once a day   finasteride 5 MG tablet Commonly known as:  PROSCAR Take 5 mg by mouth daily.   furosemide 20 MG tablet Commonly known as:  LASIX Take 2 tablets (40 mg total) by mouth daily. What changed:    how much to take  additional instructions   glucose blood test strip Use as instructed 3x a day   insulin aspart 100 UNIT/ML injection Commonly known as:  novoLOG Inject 4-10 Units into the skin 3 (three) times daily before meals. Inject up to 6 units 3x a day before meals - pens please   Insulin Pen Needle 32G X 4 MM Misc Use 1x a day   loratadine 10 MG tablet Commonly known as:  CLARITIN Take 1 tablet (10 mg total) by mouth daily.   memantine 10 MG tablet Commonly known as:  NAMENDA Take 1 tablet (10 mg total) by mouth 2 (two) times daily.   nitroGLYCERIN 0.2 mg/hr patch Commonly known as:  NITRODUR - Dosed in mg/24 hr Place 1 patch (0.2 mg total) onto the skin  daily. Alternates patch placement with odd/even days. What changed:  additional instructions   pantoprazole 40 MG tablet Commonly known as:  PROTONIX Take 1 tablet (40 mg total) by mouth daily.   PRESERVISION AREDS 2 Caps Take 1 capsule by mouth 2 (two) times daily.   SPIRIVA HANDIHALER 18 MCG inhalation capsule Generic drug:  tiotropium INHALE CONTENTS OF 1 CAPSULE VIA HANDIHALER ONCE DAILY   SYMBICORT 160-4.5 MCG/ACT inhaler Generic drug:  budesonide-formoterol INHALE 2 PUFFS BY MOUTH TWICE DAILY   Vitamin D 2000 units tablet Take 2,000 Units by mouth daily.      Follow-up Information    [name], MD. Go on 04/18/2018.   Specialty:  Family Medicine Why:  @ 10:45 am To be seen with repeat labs (CBC & BMP). Contact information: [place] [address] [phone]        Martinique, Peter M, MD. Schedule an appointment as soon as possible for a visit in 1 week(s).   Specialty:  Cardiology Contact  information: [address] [place] [address] [phone]        Schedule an appointment as soon as possible for a visit with Juanito Doom, MD.   Specialty:  Pulmonary Disease Contact information: [address]  [place] [address] [phone]          Allergies  Allergen Reactions  . Codeine Other (See Comments)    Gets very angry, disoriented  . Dilaudid [Hydromorphone Hcl] Other (See Comments)    VERY AGITATED, HOSTILE  . Flomax [Tamsulosin Hcl] Shortness Of Breath  . Morphine And Related Other (See Comments)    VERY AGITATED, HOSTILE  . Sulfa Antibiotics Shortness Of Breath  . Beta Adrenergic Blockers Other (See Comments)    Disorientation  . Carvedilol Other (See Comments)    DISORIENTATION      Procedures/Studies: Dg Chest 2 View  Result Date: 04/05/2018 CLINICAL DATA:  Dyspnea, COPD EXAM: CHEST - 2 VIEW COMPARISON:  02/13/2018 chest radiograph. FINDINGS: Stable configuration of 2 lead left subclavian ICD. Stable cardiomediastinal silhouette with mild cardiomegaly. No pneumothorax. Trace bilateral pleural effusions. Cephalization of the pulmonary vasculature without overt pulmonary edema. Hyperinflated lungs. New patchy mild right lung base opacity. IMPRESSION: 1. New mild patchy right lung base opacity, cannot exclude aspiration or pneumonia. Recommend follow-up chest radiographs to resolution. 2. Hyperinflated lungs, compatible with the provided history of COPD. 3. Stable mild cardiomegaly without overt pulmonary edema. Trace bilateral pleural effusions. Electronically Signed   By: Ilona Sorrel M.D.   On: 04/05/2018 09:46   Ct Chest Wo Contrast  Result Date: 04/05/2018 CLINICAL DATA:  Cough, shortness of breath.  Suspected aspiration. EXAM: CT CHEST WITHOUT CONTRAST TECHNIQUE: Multidetector CT imaging of the chest was performed following the standard protocol without IV contrast. COMPARISON:  Chest radiograph April 05, 2018 and CT chest January 01, 2018 FINDINGS: Respiratory motion through the lung bases. CARDIOVASCULAR: Mild cardiomegaly. No pericardial effusion. Moderate coronary artery calcifications. Thoracic aorta is normal in course and caliber with moderate calcific atherosclerosis. MEDIASTINUM/NODES: Greater than expected number mediastinal lymph nodes measuring to 10 mm, unchanged. LEFT cardiac pacemaker via LEFT subclavian venous approach. Decompressed esophagus improved from prior CT. LUNGS/PLEURA: Tracheobronchial tree is patent, no pneumothorax. Mild bronchial wall thickening. Bilateral upper lobe tree-in-bud infiltrates, to lesser extent lower lobes. Faint ground-glass opacities. Bilateral lower lobe atelectasis, no focal consolidation. Small pleural effusions. Mild centrilobular emphysema. UPPER ABDOMEN: Nonacute. Punctate nonobstructing RIGHT nephrolithiasis. MUSCULOSKELETAL: Nonacute. IMPRESSION: 1. Tree-in-bud infiltrates may  be infectious or inflammatory. Small pleural effusions. 2. Lower lobe atelectasis, less likely pneumonia without focal consolidation. 3. Bronchial wall thickening seen with bronchitis or reactive airway disease. Aortic Atherosclerosis (ICD10-I70.0) and Emphysema (ICD10-J43.9). Electronically Signed   By: Elon Alas M.D.   On: 04/05/2018 15:52   US Renal  Result Date: 04/08/2018 CLINICAL DATA:  Acute renal failure EXAM: RENAL / URINARY TRACT ULTRASOUND COMPLETE COMPARISON:  None. FINDINGS: Right Kidney: Length: 10.2 cm. No hydronephrosis or focal mass. Cortical thinning. Left Kidney: Length: 11.1 cm. Echogenicity within normal limits. No mass or hydronephrosis visualized. Bladder: Appears normal for degree of bladder distention. IMPRESSION: 1. Mild cortical thinning associated with the right kidney. The kidneys are otherwise normal. 2. Prostate is prominent in size measuring 4.6 x 3.3 x  3.9 cm. Electronically Signed   By: Dorise Bullion III M.D   On: 04/08/2018 08:58      Subjective: Patient denies  complaints.  Denies dyspnea, chest pain or cough.  Anxious to go home.  No other complaints reported.  Discharge Exam:  Vitals:   04/09/18 0526 04/09/18 0849 04/09/18 1025 04/09/18 1241  BP: 123/64  116/62 126/64  Pulse: 72 94 72 81  Resp: 18 16 18 18   Temp: (!) 97.5 F (36.4 C)  98.5 F (36.9 C) 97.8 F (36.6 C)  TempSrc: Oral  Oral Oral  SpO2: 98% 97% 100% 96%  Weight: 78.3 kg     Height:        General exam: Pleasant elderly male, moderately built and nourished, sitting up in chair.  Noted to be in good spirits. Respiratory system: Occasional basal crackles but otherwise clear to auscultation. Respiratory effort normal.   Cardiovascular system: S1 & S2 heard, RRR. No JVD, murmurs, rubs, gallops or clicks.  Trace bilateral ankle edema.    Telemetry personally reviewed: Sinus rhythm with BBB morphology.  Occasional/on demand atrial paced rhythm. Gastrointestinal system: Abdomen is nondistended, soft and nontender. No organomegaly or masses felt. Normal bowel sounds heard.  Central nervous system: Alert and oriented x2. No focal neurological deficits.   Extremities: Symmetric 5 x 5 power. Skin: No rashes, lesions or ulcers Psychiatry: Judgement and insight impaired. Mood & affect appropriate.     The results of significant diagnostics from this hospitalization (including imaging, microbiology, ancillary and laboratory) are listed below for reference.     Microbiology: Recent Results (from the past 240 hour(s))  MRSA PCR Screening     Status: None   Collection Time: 04/05/18  3:57 PM  Result Value Ref Range Status   MRSA by PCR NEGATIVE NEGATIVE Final    Comment:        The GeneXpert MRSA Assay (FDA approved for NASAL specimens only), is one component of a comprehensive MRSA colonization surveillance program. It is not intended to diagnose MRSA infection nor to guide or monitor treatment for MRSA infections. Performed at [facility], [place] [address],  [place], [place] [address]   Culture, sputum-assessment     Status: None   Collection Time: 04/05/18  5:59 PM  Result Value Ref Range Status   Specimen Description EXPECTORATED SPUTUM  Final   Special Requests NONE  Final   Sputum evaluation   Final    THIS SPECIMEN IS ACCEPTABLE FOR SPUTUM CULTURE Performed at [facility], [address], [place], [place] [address]    Report Status 04/06/2018 FINAL  Final  Culture, respiratory     Status: None   Collection Time: 04/05/18  5:59 PM  Result Value Ref Range Status   Specimen Description EXPECTORATED SPUTUM  Final   Special Requests NONE Reflexed from [id number]  Final   Gram Stain   Final    ABUNDANT WBC PRESENT,BOTH PMN AND MONONUCLEAR NO ORGANISMS SEEN    Culture   Final    Consistent with normal respiratory flora. Performed at [facility], [place] [address], [place], [place] [address]    Report Status 04/07/2018 FINAL  Final     Labs: CBC: Recent Labs  Lab 04/05/18 0826 04/06/18 0604  WBC 8.6 8.0  NEUTROABS 6.1  --   HGB 11.5* 11.4*  HCT 38.5* 36.6*  MCV 92.3 90.4  PLT 217 790   Basic Metabolic Panel: Recent Labs  Lab 04/05/18 0826 04/05/18 1510 04/06/18 0604 04/07/18 0428 04/08/18 0522 04/09/18 0626  NA 145  --  143 141 140 139  K 4.3  --  4.0 4.0 4.1 5.2*  CL 106  --  105 104 102 106  CO2 27  --  27 28 25 26   GLUCOSE 154*  --  116* 147* 131* 131*  BUN 35*  --  29* 39* 41* 36*  CREATININE 1.76* 1.62* 1.58* 2.12* 2.18* 1.77*  CALCIUM 9.5  --  9.0 8.7* 8.9 8.9  MG  --  2.2  --   --   --   --   PHOS  --  3.0  --   --   --   --    BNP (last 3 results) Recent Labs    10/13/17 1401 04/05/18 0826  BNP 398.4* 1,493.7*   Cardiac Enzymes: Recent Labs  Lab 04/05/18 1512  TROPONINI 0.04*   CBG: Recent Labs  Lab 04/08/18 1248 04/08/18 1642 04/08/18 2133 04/09/18 0810 04/09/18 1238  GLUCAP 148* 149* 146* 122* 131*   Urinalysis    Component Value Date/Time   COLORURINE YELLOW 04/05/2018 1840    APPEARANCEUR CLEAR 04/05/2018 1840   LABSPEC 1.015 04/05/2018 1840   PHURINE 5.0 04/05/2018 1840   GLUCOSEU NEGATIVE 04/05/2018 1840   HGBUR NEGATIVE 04/05/2018 1840   BILIRUBINUR NEGATIVE 04/05/2018 1840   BILIRUBINUR neg 06/06/2012 1037   KETONESUR NEGATIVE 04/05/2018 1840   PROTEINUR NEGATIVE 04/05/2018 1840   UROBILINOGEN 0.2 05/12/2015 0920   NITRITE NEGATIVE 04/05/2018 1840   LEUKOCYTESUR NEGATIVE 04/05/2018 1840    I discussed in detail with patient's son today.  Updated care and answered questions.  Time coordinating discharge: Over 40 minutes  SIGNED:  Vernell Leep, MD, FACP, [facility]. [facility] Pager [phone]  If 7PM-7AM, please contact night-coverage [email or web address] Password [email or web address] 04/09/2018, 1:20 PM

## 2018-04-09 NOTE — Progress Notes (Signed)
SATURATION QUALIFICATIONS: (This note is used to comply with regulatory documentation for home oxygen)  Patient Saturations on Room Air at Rest = 93 %  Patient Saturations on Room Air while Ambulating = 86-87 %  Patient Saturations on 2 Liters of oxygen while Ambulating = 93%  Please briefly explain why patient needs home oxygen: pt does get doe and sats drop to 86-87% on room air

## 2018-04-09 NOTE — Progress Notes (Signed)
Pharmacy Antibiotic Note  Antonio Ray is a 84 y.o. male admitted on 04/05/2018 with pneumonia on day 5 of cefepime.  Pharmacy has been consulted for Augmentin dosing -SCr= 1.77, CrCl ~ 30 - Plan: -Augmentin 500mg  po q12h -Consider 3 days for a total antibiotic duration on 7-8 days  Hildred Laser, PharmD Clinical Pharmacist Please check Amion for pharmacy contact number

## 2018-04-09 NOTE — Care Management Note (Addendum)
Case Management Note  Patient Details  Name: Antonio Ray MRN: [id number] Date of Birth: 28-Aug-1933  Subjective/Objective:    Asp PNA, acute on chronic resp failure with hypoxia, Toxic metabolic encephalopathy                Action/Plan:  NCM spoke to pt and son, Antonio Ray at bedside. Pt lives at home with alone but his daughter is his neighbor. Children check on pt frequently during the day. Discussed medical alert system. Son states pt has declined to have medical alert system in home. He has oxygen with [facility].Contacted [facility] for portable to be delivered to room prior to dc and new [place] referral.    Expected Discharge Date:  04/09/18               Expected Discharge Plan:  [place]  In-House Referral:  NA  Discharge planning Services  CM Consult  Post Acute Care Choice:  Home Health Choice offered to:  Adult Children  DME Arranged:  N/A DME Agency:  NA  HH Arranged:  PT HH Agency:  [facility]  Status of Service:  Completed, signed off  If discussed at [place] of Stay Meetings, dates discussed:    Additional Comments:  Erenest Rasher, RN 04/09/2018, 1:36 PM

## 2018-04-09 NOTE — Discharge Instructions (Signed)

## 2018-04-10 DIAGNOSIS — J449 Chronic obstructive pulmonary disease, unspecified: Secondary | ICD-10-CM | POA: Diagnosis not present

## 2018-04-10 DIAGNOSIS — Z95810 Presence of automatic (implantable) cardiac defibrillator: Secondary | ICD-10-CM | POA: Diagnosis not present

## 2018-04-10 DIAGNOSIS — N183 Chronic kidney disease, stage 3 (moderate): Secondary | ICD-10-CM | POA: Diagnosis not present

## 2018-04-10 DIAGNOSIS — Z794 Long term (current) use of insulin: Secondary | ICD-10-CM | POA: Diagnosis not present

## 2018-04-10 DIAGNOSIS — K219 Gastro-esophageal reflux disease without esophagitis: Secondary | ICD-10-CM | POA: Diagnosis not present

## 2018-04-10 DIAGNOSIS — Z87891 Personal history of nicotine dependence: Secondary | ICD-10-CM | POA: Diagnosis not present

## 2018-04-10 DIAGNOSIS — I255 Ischemic cardiomyopathy: Secondary | ICD-10-CM | POA: Diagnosis not present

## 2018-04-10 DIAGNOSIS — I2510 Atherosclerotic heart disease of native coronary artery without angina pectoris: Secondary | ICD-10-CM | POA: Diagnosis not present

## 2018-04-10 DIAGNOSIS — J9621 Acute and chronic respiratory failure with hypoxia: Secondary | ICD-10-CM | POA: Diagnosis not present

## 2018-04-10 DIAGNOSIS — Z7982 Long term (current) use of aspirin: Secondary | ICD-10-CM | POA: Diagnosis not present

## 2018-04-10 DIAGNOSIS — I5023 Acute on chronic systolic (congestive) heart failure: Secondary | ICD-10-CM | POA: Diagnosis not present

## 2018-04-10 DIAGNOSIS — E785 Hyperlipidemia, unspecified: Secondary | ICD-10-CM | POA: Diagnosis not present

## 2018-04-10 DIAGNOSIS — F0390 Unspecified dementia without behavioral disturbance: Secondary | ICD-10-CM | POA: Diagnosis not present

## 2018-04-10 DIAGNOSIS — Z9981 Dependence on supplemental oxygen: Secondary | ICD-10-CM | POA: Diagnosis not present

## 2018-04-10 DIAGNOSIS — J690 Pneumonitis due to inhalation of food and vomit: Secondary | ICD-10-CM | POA: Diagnosis not present

## 2018-04-10 DIAGNOSIS — E1122 Type 2 diabetes mellitus with diabetic chronic kidney disease: Secondary | ICD-10-CM | POA: Diagnosis not present

## 2018-04-10 LAB — CUP PACEART REMOTE DEVICE CHECK
Battery Remaining Percentage: 31 %
Date Time Interrogation Session: 20190910102855
HighPow Impedance: 68 Ohm
Implantable Lead Implant Date: 20120829
Implantable Pulse Generator Implant Date: 20120829
Lead Channel Impedance Value: 340 Ohm
Lead Channel Sensing Intrinsic Amplitude: 1.7 mV
Lead Channel Sensing Intrinsic Amplitude: 12 mV
Lead Channel Setting Sensing Sensitivity: 0.5 mV
MDC IDC LEAD IMPLANT DT: 20120829
MDC IDC LEAD LOCATION: 753859
MDC IDC LEAD LOCATION: 753860
MDC IDC MSMT BATTERY REMAINING LONGEVITY: 28 mo
MDC IDC MSMT LEADCHNL RA IMPEDANCE VALUE: 380 Ohm
MDC IDC SET LEADCHNL RA PACING AMPLITUDE: 2 V
MDC IDC SET LEADCHNL RV PACING AMPLITUDE: 2.5 V
MDC IDC SET LEADCHNL RV PACING PULSEWIDTH: 0.5 ms
MDC IDC STAT BRADY RA PERCENT PACED: 40 %
MDC IDC STAT BRADY RV PERCENT PACED: 1.1 %
Pulse Gen Serial Number: 828604

## 2018-04-12 DIAGNOSIS — J9621 Acute and chronic respiratory failure with hypoxia: Secondary | ICD-10-CM | POA: Diagnosis not present

## 2018-04-12 DIAGNOSIS — I255 Ischemic cardiomyopathy: Secondary | ICD-10-CM | POA: Diagnosis not present

## 2018-04-12 DIAGNOSIS — J690 Pneumonitis due to inhalation of food and vomit: Secondary | ICD-10-CM | POA: Diagnosis not present

## 2018-04-12 DIAGNOSIS — I5023 Acute on chronic systolic (congestive) heart failure: Secondary | ICD-10-CM | POA: Diagnosis not present

## 2018-04-12 DIAGNOSIS — I2510 Atherosclerotic heart disease of native coronary artery without angina pectoris: Secondary | ICD-10-CM | POA: Diagnosis not present

## 2018-04-12 DIAGNOSIS — E1122 Type 2 diabetes mellitus with diabetic chronic kidney disease: Secondary | ICD-10-CM | POA: Diagnosis not present

## 2018-04-13 DIAGNOSIS — J9621 Acute and chronic respiratory failure with hypoxia: Secondary | ICD-10-CM | POA: Diagnosis not present

## 2018-04-13 DIAGNOSIS — I5023 Acute on chronic systolic (congestive) heart failure: Secondary | ICD-10-CM | POA: Diagnosis not present

## 2018-04-13 DIAGNOSIS — I2510 Atherosclerotic heart disease of native coronary artery without angina pectoris: Secondary | ICD-10-CM | POA: Diagnosis not present

## 2018-04-13 DIAGNOSIS — E1122 Type 2 diabetes mellitus with diabetic chronic kidney disease: Secondary | ICD-10-CM | POA: Diagnosis not present

## 2018-04-13 DIAGNOSIS — I255 Ischemic cardiomyopathy: Secondary | ICD-10-CM | POA: Diagnosis not present

## 2018-04-13 DIAGNOSIS — J690 Pneumonitis due to inhalation of food and vomit: Secondary | ICD-10-CM | POA: Diagnosis not present

## 2018-04-16 ENCOUNTER — Ambulatory Visit (INDEPENDENT_AMBULATORY_CARE_PROVIDER_SITE_OTHER): Payer: Medicare Other | Admitting: Physician Assistant

## 2018-04-16 ENCOUNTER — Encounter: Payer: Self-pay | Admitting: Physician Assistant

## 2018-04-16 ENCOUNTER — Ambulatory Visit (INDEPENDENT_AMBULATORY_CARE_PROVIDER_SITE_OTHER): Payer: Medicare Other | Admitting: Nurse Practitioner

## 2018-04-16 ENCOUNTER — Encounter: Payer: Self-pay | Admitting: Nurse Practitioner

## 2018-04-16 VITALS — BP 122/64 | HR 62 | Ht 68.0 in | Wt 174.6 lb

## 2018-04-16 VITALS — BP 107/58 | HR 76 | Wt 174.2 lb

## 2018-04-16 DIAGNOSIS — Z23 Encounter for immunization: Secondary | ICD-10-CM

## 2018-04-16 DIAGNOSIS — I255 Ischemic cardiomyopathy: Secondary | ICD-10-CM

## 2018-04-16 DIAGNOSIS — I2510 Atherosclerotic heart disease of native coronary artery without angina pectoris: Secondary | ICD-10-CM

## 2018-04-16 DIAGNOSIS — J690 Pneumonitis due to inhalation of food and vomit: Secondary | ICD-10-CM | POA: Diagnosis not present

## 2018-04-16 DIAGNOSIS — I5042 Chronic combined systolic (congestive) and diastolic (congestive) heart failure: Secondary | ICD-10-CM | POA: Diagnosis not present

## 2018-04-16 DIAGNOSIS — J9611 Chronic respiratory failure with hypoxia: Secondary | ICD-10-CM

## 2018-04-16 MED ORDER — FUROSEMIDE 20 MG PO TABS: 20.0000 mg | ORAL_TABLET | Freq: Every day | ORAL | 0 refills | Status: DC

## 2018-04-16 NOTE — Progress Notes (Signed)
@Patient  ID: Antonio Ray, male    DOB: May 02, 1934, 84 y.o.   MRN: [id number]  Chief Complaint  Patient presents with  . Hospitalization Follow-up    Pt in the hosp 9/5-9/9 with poss aspiration pna.  Pt states he has been up and down but states he is slowly improving. Pt has been clearing his throat a lot since has been out of the hospital.     Referring provider: [name], MD  HPI 84 year old male former smoker with severe COPD complicated by recurrent aspiration pneumonia due to achalasia.  Health history significant for CHF, AICD, CKD  Recent significant events: Hospital summary 04/05/18 - 04/09/18 Aspiration pneumonia, recurrent: Chest x-ray showed new mild patchy right lung base opacity. CT chest without contrast: Tree-in-bud infiltrates noted which may be infectious or inflammatory. Empirically started on IV cefepime and vancomycin. Could be related to his esophageal issues i.e. achalasia/GERD/dysmotility. Speech therapy evaluated and recommended regular diet with thin liquids. Palliative care was consulted for goals of care and recommend continued full scope of treatment. Sputum culture: Normal respiratory flora, AFB smear negative.. DC vancomycin.  Patient has been switched to Augmentin to complete total 7 days treatment.  Follow-up chest x-ray as outpatient.  Remains at risk for recurrent aspiration. Acute on chronic respiratory failure with hypoxia: On as needed home oxygen PTA.  Now likely precipitated due to aspiration pneumonia/pneumonitis and decompensated CHF.  Meets criteria for home oxygen, arranged by case management.  Reassess during outpatient follow-up with PCP. CAD/ Acute on chronic systolic CHF/AICD: Patient was likely volume overloaded on arrival.  He had been placed on increased dose of Lasix 40 mg twice daily by mouth.Minimally elevated troponin, likely demand. His initial presentation may be a combination of mildly decompensated CHF and some aspiration  pneumonia/pneumonitis.  Then his creatinine started rising and his Lasix was temporarily held since 9/7.  Creatinine has improved.  Developing some lower extremity edema.  Will discharge on slightly higher than previous dose of home Lasix at 40 mg daily.  Close outpatient follow-up with BMP with PCP and his cardiologist.  Tests: Chest CT 04/05/18 - Tree-in-bud infiltrates may be infectious or inflammatory. Small pleural effusions.Lower lobe atelectasis, less likely pneumonia without focal consolidation. Bronchial wall thickening seen with bronchitis or reactive airway disease. 09/2017 Echo EF 20 to 62%, grade 2 diastolic dysfunction Endo 01/03/18 > severely dilated lumen of the esophagus, moderate amount of fluid in the entire esophagus status post dilatation 30 mm pneumatic balloon. CT chest January 01, 2018 right-sided pneumonia, diffuse distention of the esophagus  OV 04/16/18 - Hospital follow up aspiration PNA/CHF Patient presents for a hospital follow up. He was admitted with aspiration pneumonia and CHF. He states that he is doing much better. Son reports that he has not needed home O2. His sats have been in the upper 90's on RA. He has been active - continues to take morning walks. He followed up with cardiology today. They had increased his lasix at hospital discharge. He is weighing daily. Denies any shortness of breath, chest pain, or peripheral edema. Patient lives at home alone, but has a son and daughter who check on him frequently.   Allergies  Allergen Reactions  . Codeine Other (See Comments)    Gets very angry, disoriented  . Dilaudid [Hydromorphone Hcl] Other (See Comments)    VERY AGITATED, HOSTILE  . Flomax [Tamsulosin Hcl] Shortness Of Breath  . Morphine And Related Other (See Comments)    VERY AGITATED,  HOSTILE  . Sulfa Antibiotics Shortness Of Breath  . Beta Adrenergic Blockers Other (See Comments)    Disorientation  . Carvedilol Other (See Comments)    DISORIENTATION     Immunization History  Administered Date(s) Administered  . Influenza Split 05/09/2013, 06/16/2015  . Influenza, High Dose Seasonal PF 06/04/2014, 07/02/2015, 05/17/2017, 04/16/2018  . Influenza, Seasonal, Injecte, Preservative Fre 04/11/2016  . Influenza,inj,Quad PF,6+ Mos 04/11/2016  . Pneumococcal Conjugate-13 12/12/2014  . Pneumococcal Polysaccharide-23 12/22/2008  . Tdap 12/22/2008, 03/20/2013    Past Medical History:  Diagnosis Date  . Achalasia   . AICD (automatic cardioverter/defibrillator) present 03/30/2011   Analyze ST study patient  . BPH (benign prostatic hyperplasia)   . Chronic systolic dysfunction of left ventricle    EF 30-35%, CLASS II - III SYMPTOMS; intolerant to Coreg  . CKD (chronic kidney disease) stage 3, GFR 30-59 ml/min ([name]) 06/08/2012  . COPD (chronic obstructive pulmonary disease) ([place])   . Coronary artery disease    History of remote anterior MI with PCI to LAD in 2006; most recent cath 2007, no intervention required  . DOE (dyspnea on exertion)    with heavy exertion  . Dyspnea   . Dysrhythmia   . Esophageal dysmotility   . GERD (gastroesophageal reflux disease)   . Head injury, closed, with concussion 2000ish  . Heart murmur    hx  . HOH (hard of hearing)   . Hyperlipidemia   . Memory loss    improved  . Pneumonia "several times"   aspiration pna with at least 3 admits for this 2016.   Marland Kitchen PVC's (premature ventricular contractions)   . Renal failure   . Skin cancer "several"   "forearms; head"  . Type II diabetes mellitus ([name])     Tobacco History: Social History   Tobacco Use  Smoking Status Former Smoker  . Packs/day: 1.00  . Years: 35.00  . Pack years: 35.00  . Types: Cigarettes  . Last attempt to quit: 08/01/1992  . Years since quitting: 25.7  Smokeless Tobacco Former Systems developer  . Quit date: 1994   Counseling given: Yes   Outpatient Encounter Medications as of 04/16/2018  Medication Sig  . albuterol (PROVENTIL HFA;VENTOLIN  HFA) 108 (90 Base) MCG/ACT inhaler Inhale 2 puffs into the lungs every 6 (six) hours as needed for wheezing or shortness of breath.  Marland Kitchen aspirin 81 MG EC tablet Take 81 mg by mouth daily.    . Cholecalciferol (VITAMIN D) 2000 units tablet Take 2,000 Units by mouth daily.   Marland Kitchen donepezil (ARICEPT) 10 MG tablet Take 1 tablet (10 mg total) by mouth every morning.  . fenofibrate (TRICOR) 145 MG tablet Take 145 mg by mouth once a day  . finasteride (PROSCAR) 5 MG tablet Take 5 mg by mouth daily.  . furosemide (LASIX) 20 MG tablet Take 1 tablet (20 mg total) by mouth daily. Take an additional 20mg  if your weight is up 3lbs in a day 5lbs in a week  . glucose blood (FREESTYLE TEST STRIPS) test strip Use as instructed 3x a day  . insulin aspart (NOVOLOG) 100 UNIT/ML injection Inject 4-10 Units into the skin 3 (three) times daily before meals. Inject up to 6 units 3x a day before meals - pens please  . Insulin Pen Needle 32G X 4 MM MISC Use 1x a day  . loratadine (CLARITIN) 10 MG tablet Take 1 tablet (10 mg total) by mouth daily.  . memantine (NAMENDA) 10 MG tablet Take 1 tablet (  10 mg total) by mouth 2 (two) times daily.  . Multiple Vitamins-Minerals (PRESERVISION AREDS 2) CAPS Take 1 capsule by mouth 2 (two) times daily.  . nitroGLYCERIN (NITRODUR - DOSED IN MG/24 HR) 0.2 mg/hr patch Place 1 patch (0.2 mg total) onto the skin daily. Alternates patch placement with odd/even days.  . pantoprazole (PROTONIX) 40 MG tablet Take 1 tablet (40 mg total) by mouth daily.  . pravastatin (PRAVACHOL) 40 MG tablet Take 40 mg by mouth daily.  Marland Kitchen SPIRIVA HANDIHALER 18 MCG inhalation capsule INHALE CONTENTS OF 1 CAPSULE VIA HANDIHALER ONCE DAILY  . SYMBICORT 160-4.5 MCG/ACT inhaler INHALE 2 PUFFS BY MOUTH TWICE DAILY  . Insulin Glargine (BASAGLAR KWIKPEN) 100 UNIT/ML SOPN INJECT  6 UNITS UNDER THE SKIN EVERY MORNING (Patient not taking: Reported on 04/16/2018)   No facility-administered encounter medications on file as of  04/16/2018.      Review of Systems  Review of Systems  Constitutional: Negative.  Negative for activity change, appetite change, chills and fever.  HENT: Negative.  Negative for congestion.   Respiratory: Negative for cough and shortness of breath.   Cardiovascular: Negative.  Negative for chest pain, palpitations and leg swelling.  Gastrointestinal: Negative.   Allergic/Immunologic: Negative.   Neurological: Negative.   Psychiatric/Behavioral: Negative.        Physical Exam  BP 122/64 (BP Location: Left Arm, Cuff Size: Normal)   Pulse 62   Ht 5\' 8"  (1.727 m)   Wt 174 lb 9.6 oz (79.2 kg)   SpO2 98%   BMI 26.55 kg/m   Wt Readings from Last 5 Encounters:  04/16/18 174 lb 9.6 oz (79.2 kg)  04/16/18 174 lb 3.2 oz (79 kg)  04/09/18 172 lb 9.6 oz (78.3 kg)  03/23/18 177 lb 2 oz (80.3 kg)  02/22/18 170 lb 12.8 oz (77.5 kg)     Physical Exam  Constitutional: He is oriented to person, place, and time. He appears well-developed and well-nourished. No distress.  Cardiovascular: Normal rate and regular rhythm.  Pulmonary/Chest: Effort normal and breath sounds normal. No respiratory distress. He has no wheezes. He has no rales.  Neurological: He is alert and oriented to person, place, and time.  Skin: Skin is warm and dry.  Psychiatric: He has a normal mood and affect.  Nursing note and vitals reviewed.   Imaging: Dg Chest 2 View  Result Date: 04/05/2018 CLINICAL DATA:  Dyspnea, COPD EXAM: CHEST - 2 VIEW COMPARISON:  02/13/2018 chest radiograph. FINDINGS: Stable configuration of 2 lead left subclavian ICD. Stable cardiomediastinal silhouette with mild cardiomegaly. No pneumothorax. Trace bilateral pleural effusions. Cephalization of the pulmonary vasculature without overt pulmonary edema. Hyperinflated lungs. New patchy mild right lung base opacity. IMPRESSION: 1. New mild patchy right lung base opacity, cannot exclude aspiration or pneumonia. Recommend follow-up chest radiographs  to resolution. 2. Hyperinflated lungs, compatible with the provided history of COPD. 3. Stable mild cardiomegaly without overt pulmonary edema. Trace bilateral pleural effusions. Electronically Signed   By: Ilona Sorrel M.D.   On: 04/05/2018 09:46   Ct Chest Wo Contrast  Result Date: 04/05/2018 CLINICAL DATA:  Cough, shortness of breath.  Suspected aspiration. EXAM: CT CHEST WITHOUT CONTRAST TECHNIQUE: Multidetector CT imaging of the chest was performed following the standard protocol without IV contrast. COMPARISON:  Chest radiograph April 05, 2018 and CT chest January 01, 2018 FINDINGS: Respiratory motion through the lung bases. CARDIOVASCULAR: Mild cardiomegaly. No pericardial effusion. Moderate coronary artery calcifications. Thoracic aorta is normal in course and caliber with  moderate calcific atherosclerosis. MEDIASTINUM/NODES: Greater than expected number mediastinal lymph nodes measuring to 10 mm, unchanged. LEFT cardiac pacemaker via LEFT subclavian venous approach. Decompressed esophagus improved from prior CT. LUNGS/PLEURA: Tracheobronchial tree is patent, no pneumothorax. Mild bronchial wall thickening. Bilateral upper lobe tree-in-bud infiltrates, to lesser extent lower lobes. Faint ground-glass opacities. Bilateral lower lobe atelectasis, no focal consolidation. Small pleural effusions. Mild centrilobular emphysema. UPPER ABDOMEN: Nonacute. Punctate nonobstructing RIGHT nephrolithiasis. MUSCULOSKELETAL: Nonacute. IMPRESSION: 1. Tree-in-bud infiltrates may be infectious or inflammatory. Small pleural effusions. 2. Lower lobe atelectasis, less likely pneumonia without focal consolidation. 3. Bronchial wall thickening seen with bronchitis or reactive airway disease. Aortic Atherosclerosis (ICD10-I70.0) and Emphysema (ICD10-J43.9). Electronically Signed   By: Elon Alas M.D.   On: 04/05/2018 15:52   US Renal  Result Date: 04/08/2018 CLINICAL DATA:  Acute renal failure EXAM: RENAL / URINARY  TRACT ULTRASOUND COMPLETE COMPARISON:  None. FINDINGS: Right Kidney: Length: 10.2 cm. No hydronephrosis or focal mass. Cortical thinning. Left Kidney: Length: 11.1 cm. Echogenicity within normal limits. No mass or hydronephrosis visualized. Bladder: Appears normal for degree of bladder distention. IMPRESSION: 1. Mild cortical thinning associated with the right kidney. The kidneys are otherwise normal. 2. Prostate is prominent in size measuring 4.6 x 3.3 x  3.9 cm. Electronically Signed   By: Dorise Bullion III M.D   On: 04/08/2018 08:58     Assessment & Plan:   Pneumonia, aspiration [facility]) Patient is improved - back to baseline Patient Instructions  Continue lasix per cardiology recommendations Will order follow up chest xray for Wednesday per family request Continue Symbicort, spiriva and Proventil   May D/C home O2 - sats stable - tubing is a trip hazard Follow up with Dr. Lake Bells in 1 month Please call if symptoms worsen or go to the ED    Chronic respiratory failure with hypoxia [facility]) Patient Instructions  Continue lasix per cardiology recommendations Will order follow up chest xray for Wednesday per family request Continue Symbicort, spiriva and Proventil   May D/C home O2 - sats stable - tubing is a trip hazard Follow up with Dr. Lake Bells in 1 month Please call if symptoms worsen or go to the ED       Fenton Foy, NP 04/16/2018

## 2018-04-16 NOTE — Progress Notes (Signed)
Cardiology Office Note   Date:  04/16/2018   ID:  Antonio Ray, DOB 1933-12-26, MRN [id number]  PCP:  [name], MD  Cardiologist:  Dr Martinique, 08/15/2017 EP: Dr. Threasa Heads, [facility] 01/16/2018 Rosaria Ferries, PA-C   Chief Complaint  Patient presents with  . Follow-up    Pt states no Sx.     History of Present Illness: Antonio Ray is a 84 y.o. male with a history of  achalasia, recurrent aspiration pneumonia, CAD, chronic systolic heart failure, COPD, stage III CKD, HLD, DM 2, dementia, ICM w/ EF 30-35%>>10-15%>>20-25%, s/p STJ ICD, PTCA LAD 2004  Admitted 09/05-09/04/2018 for ?asp PNA, CHF. Lasix 40 mg qd. Wt decreased to 165 after d/c. Lasix decreased to 20 mg qd.   Antonio Ray presents for cardiology follow up.  His family is providing his food. Breakfast is from [employer], lunch is sandwiches.  Dinner is brought to him by his family.  He is doing PT and is walking.  He is trying to get stronger.  Has R>L LE edema, mostly during the day. Does not put his feet up during the day.  Son is present but cannot say if he is waking up with the edema or not.   After he lost 8 pounds, the son quit giving him the Lasix.  His son wonders if it would be okay to restart it at a lower dose, his previous dose was 20 mg a day.  Has weaned off the O2, not wearing it all the time now. O2 sats are staying higher, 92% while asleep.  The oxygen tubing is a trip hazard, needs to get off it.  His son has checked his oxygen saturation on multiple occasions, it is always greater than 92% on room air.  He [place] alone [place] acres.  Both his son and daughter feel that he is probably overexerting himself.  Antonio Ray cannot say how far he walks on a daily basis.   Past Medical History:  Diagnosis Date  . Achalasia   . AICD (automatic cardioverter/defibrillator) present 03/30/2011   Analyze ST study patient  . BPH (benign prostatic hyperplasia)   . Chronic systolic  dysfunction of left ventricle    EF 30-35%, CLASS II - III SYMPTOMS; intolerant to Coreg  . CKD (chronic kidney disease) stage 3, GFR 30-59 ml/min ([name]) 06/08/2012  . COPD (chronic obstructive pulmonary disease) ([place])   . Coronary artery disease    History of remote anterior MI with PCI to LAD in 2006; most recent cath 2007, no intervention required  . DOE (dyspnea on exertion)    with heavy exertion  . Dyspnea   . Dysrhythmia   . Esophageal dysmotility   . GERD (gastroesophageal reflux disease)   . Head injury, closed, with concussion 2000ish  . Heart murmur    hx  . HOH (hard of hearing)   . Hyperlipidemia   . Memory loss    improved  . Pneumonia "several times"   aspiration pna with at least 3 admits for this 2016.   Marland Kitchen PVC's (premature ventricular contractions)   . Renal failure   . Skin cancer "several"   "forearms; head"  . Type II diabetes mellitus ([place])     Past Surgical History:  Procedure Laterality Date  . BALLOON DILATION N/A 09/09/2015   Procedure: BALLOON DILATION;  Surgeon: Mauri Pole, MD;  Location: [facility];  Service: Endoscopy;  Laterality: N/A;  rigiflex achalasia  balloon dilators  . BOTOX INJECTION N/A 04/08/2015   Procedure: BOTOX INJECTION;  Surgeon: Milus Banister, MD;  Location: [facility];  Service: Endoscopy;  Laterality: N/A;  . CARDIAC CATHETERIZATION  01/11/2006   DEMONSTRATES AKINESIA OF THE DISTAL ANTERIOR WALL, DISTAL INFERIOR WALL AND AKINESIA OF THE APEX. THE BASAL SEGMENTS CONTRACT WELL AND OVERALL EF 35%  . CATARACT EXTRACTION W/ INTRAOCULAR LENS  IMPLANT, BILATERAL  08/2014-09/2014  . CHOLECYSTECTOMY  2/11  . CORONARY ANGIOPLASTY  1994   TO THE LAD  . ESOPHAGEAL MANOMETRY N/A 03/16/2015   Procedure: ESOPHAGEAL MANOMETRY (EM);  Surgeon: Jerene Bears, MD;  Location: [facility];  Service: Gastroenterology;  Laterality: N/A;  . ESOPHAGOGASTRODUODENOSCOPY N/A 04/08/2015   Procedure: ESOPHAGOGASTRODUODENOSCOPY (EGD);  Surgeon: Milus Banister, MD;  Location: [facility];  Service: Endoscopy;  Laterality: N/A;  . ESOPHAGOGASTRODUODENOSCOPY N/A 01/03/2018   Procedure: ESOPHAGOGASTRODUODENOSCOPY (EGD);  Surgeon: Mauri Pole, MD;  Location: [facility];  Service: Endoscopy;  Laterality: N/A;  . ESOPHAGOGASTRODUODENOSCOPY (EGD) WITH PROPOFOL N/A 09/09/2015   Procedure: ESOPHAGOGASTRODUODENOSCOPY (EGD) WITH PROPOFOL;  Surgeon: Mauri Pole, MD;  Location: [facility];  Service: Endoscopy;  Laterality: N/A;  pt. is to have gastrografin xray post recovery-do not discharge until results are back  . EYE SURGERY    . FOOT FRACTURE SURGERY Right 1980's  . INSERT / REPLACE / REMOVE PACEMAKER    . SAVORY DILATION N/A 01/03/2018   Procedure: RIGIFLEX DILATION;  Surgeon: Mauri Pole, MD;  Location: [facility];  Service: Endoscopy;  Laterality: N/A;  . SKIN CANCER EXCISION  "several"   "forearms, head"    Current Outpatient Medications  Medication Sig Dispense Refill  . albuterol (PROVENTIL HFA;VENTOLIN HFA) 108 (90 Base) MCG/ACT inhaler Inhale 2 puffs into the lungs every 6 (six) hours as needed for wheezing or shortness of breath. 1 Inhaler 2  . aspirin 81 MG EC tablet Take 81 mg by mouth daily.      . Cholecalciferol (VITAMIN D) 2000 units tablet Take 2,000 Units by mouth daily.     Marland Kitchen donepezil (ARICEPT) 10 MG tablet Take 1 tablet (10 mg total) by mouth every morning. 90 tablet 4  . fenofibrate (TRICOR) 145 MG tablet Take 145 mg by mouth once a day 90 tablet 0  . finasteride (PROSCAR) 5 MG tablet Take 5 mg by mouth daily.  0  . furosemide (LASIX) 20 MG tablet Take 2 tablets (40 mg total) by mouth daily. 60 tablet 0  . glucose blood (FREESTYLE TEST STRIPS) test strip Use as instructed 3x a day 200 each 3  . insulin aspart (NOVOLOG) 100 UNIT/ML injection Inject 4-10 Units into the skin 3 (three) times daily before meals. Inject up to 6 units 3x a day before meals - pens please 15 mL 5  . Insulin Glargine (BASAGLAR  KWIKPEN) 100 UNIT/ML SOPN INJECT  6 UNITS UNDER THE SKIN EVERY MORNING 15 mL 0  . Insulin Pen Needle 32G X 4 MM MISC Use 1x a day 200 each 3  . loratadine (CLARITIN) 10 MG tablet Take 1 tablet (10 mg total) by mouth daily. 30 tablet 1  . memantine (NAMENDA) 10 MG tablet Take 1 tablet (10 mg total) by mouth 2 (two) times daily. 180 tablet 4  . Multiple Vitamins-Minerals (PRESERVISION AREDS 2) CAPS Take 1 capsule by mouth 2 (two) times daily.    . nitroGLYCERIN (NITRODUR - DOSED IN MG/24 HR) 0.2 mg/hr patch Place 1 patch (0.2 mg total) onto the skin  daily. Alternates patch placement with odd/even days. (Patient taking differently: Place 0.2 mg onto the skin daily. ) 30 patch 6  . pantoprazole (PROTONIX) 40 MG tablet Take 1 tablet (40 mg total) by mouth daily.    . pravastatin (PRAVACHOL) 40 MG tablet Take 40 mg by mouth daily.    Marland Kitchen SPIRIVA HANDIHALER 18 MCG inhalation capsule INHALE CONTENTS OF 1 CAPSULE VIA HANDIHALER ONCE DAILY 30 capsule 11  . SYMBICORT 160-4.5 MCG/ACT inhaler INHALE 2 PUFFS BY MOUTH TWICE DAILY 10.2 g 5   No current facility-administered medications for this visit.     Allergies:   Codeine; Dilaudid [hydromorphone hcl]; Flomax [tamsulosin hcl]; Morphine and related; Sulfa antibiotics; Beta adrenergic blockers; and Carvedilol    Social History:  The patient  reports that he quit smoking about 25 years ago. His smoking use included cigarettes. He has a 35.00 pack-year smoking history. He quit smokeless tobacco use about 25 years ago. He reports that he does not drink alcohol or use drugs.   Family History:  The patient's family history includes Stroke in his father; Venous thrombosis in his sister.    ROS:  Please see the history of present illness. All other systems are reviewed and negative.    PHYSICAL EXAM: VS:  BP (!) 107/58   Pulse 76   Wt 174 lb 3.2 oz (79 kg)   BMI 26.49 kg/m  , BMI Body mass index is 26.49 kg/m. GEN: Well nourished, well developed, male in no  acute distress  HEENT: normal for age  Neck: no JVD, no carotid bruit, no masses Cardiac: RRR; soft murmur, no rubs, or gallops Respiratory: Decreased breath sounds bases bilaterally, normal work of breathing GI: soft, nontender, nondistended, + BS MS: no deformity or atrophy; trace pedal edema; distal pulses are 1-2+ in all 4 extremities   Skin: warm and dry, no rash Neuro:  Strength and sensation are intact Psych: euthymic mood, full affect   EKG:  EKG is not ordered today.  ECHO: 10/15/2017 - Left ventricle: The cavity size was moderately dilated. Wall   thickness was normal. Systolic function was severely reduced. The   estimated ejection fraction was in the range of 20% to 25%.   Akinesis of the anteroseptal, anterior, and apical myocardium;   consistent with infarction in the distribution of the left   anterior descending coronary artery. Akinesis and scarring of the   mid-apicalinferior and inferoseptal myocardium; consistent with   infarction in the distribution of the right coronary artery.   Features are consistent with a pseudonormal left ventricular   filling pattern, with concomitant abnormal relaxation and   increased filling pressure (grade 2 diastolic dysfunction).   Acoustic contrast opacification revealed no evidence ofthrombus. - Aortic valve: There was trivial regurgitation. - Mitral valve: There was mild to moderate regurgitation directed   centrally. - Left atrium: The atrium was moderately dilated. - Right atrium: The atrium was mildly dilated.   Recent Labs: 01/02/2018: TSH 0.635 02/13/2018: ALT 17 04/05/2018: B Natriuretic Peptide 1,493.7; Magnesium 2.2 04/06/2018: Hemoglobin 11.4; Platelets 192 04/09/2018: BUN 36; Creatinine, Ser 1.77; Potassium 5.2; Sodium 139    Lipid Panel    Component Value Date/Time   CHOL 136 06/20/2013 0924   TRIG 142.0 06/20/2013 0924   HDL 32.00 (L) 06/20/2013 0924   CHOLHDL 4 06/20/2013 0924   VLDL 28.4 06/20/2013 0924    LDLCALC 76 06/20/2013 0924     Wt Readings from Last 3 Encounters:  04/16/18 174 lb 9.6 oz (  79.2 kg)  04/16/18 174 lb 3.2 oz (79 kg)  04/09/18 172 lb 9.6 oz (78.3 kg)     Other studies Reviewed: Additional studies/ records that were reviewed today include: Office notes, hospital records and testing.  ASSESSMENT AND PLAN:  1.  Chronic combined systolic and diastolic CHF: He has significant unintentional sodium indiscretion. - Limit sodium to 500 mg a meal, or 2000 mg a day -The son states they will be more careful about reading labels on foods.  We reviewed some of the lower sodium foods at [facility], whenever they get restaurant food, it is important to look it up and try to pick a low-salt option - If he improves compliance with a low-sodium diet, 20 mg a day of Lasix might be enough.  Okay to switch to this and use additional Lasix as needed for weight gain or edema. -Check a BMET today  2.  Ischemic cardiomyopathy: -He has a Environmental health practitioner ICD. -He is compliant with device check, last one in July. -Follow-up as scheduled  3.  CAD: He is not having ischemic symptoms.  He is on aspirin, and a nitroglycerin patch.  He is on a statin.  No beta-blocker due to resting hypotension.  No ischemic symptoms, therefore no med changes.  Current medicines are reviewed at length with the patient today.  The patient has concerns regarding medicines.  Concerns were addressed  The following changes have been made: Okay to decrease the Lasix  Labs/ tests ordered today include:   Orders Placed This Encounter  Procedures  . Basic metabolic panel     Disposition:   FU with Dr. Martinique and Dr. Rayann Heman as scheduled  Signed, Rosaria Ferries, PA-C  04/16/2018 5:15 PM    [facility] Phone: [phone]; Fax: [phone]  This note was written with the assistance of speech recognition software. Please excuse any transcriptional errors.

## 2018-04-16 NOTE — Assessment & Plan Note (Signed)
Patient is improved - back to baseline Patient Instructions  Continue lasix per cardiology recommendations Will order follow up chest xray for Wednesday per family request Continue Symbicort, spiriva and Proventil   May D/C home O2 - sats stable - tubing is a trip hazard Follow up with Dr. Lake Bells in 1 month Please call if symptoms worsen or go to the ED

## 2018-04-16 NOTE — Assessment & Plan Note (Signed)
Patient Instructions  Continue lasix per cardiology recommendations Will order follow up chest xray for Wednesday per family request Continue Symbicort, spiriva and Proventil   May D/C home O2 - sats stable - tubing is a trip hazard Follow up with Dr. Lake Bells in 1 month Please call if symptoms worsen or go to the ED

## 2018-04-16 NOTE — Patient Instructions (Addendum)
Continue lasix per cardiology recommendations Will order follow up chest xray for Wednesday per family request Continue Symbicort, spiriva and Proventil   May D/C home O2 - sats stable - tubing is a trip hazard Follow up with Dr. Lake Bells in 1 month Please call if symptoms worsen or go to the ED

## 2018-04-16 NOTE — Patient Instructions (Signed)
Medication Instructions:  Your physician has recommended you make the following change in your medication:  REDUCE Lasix to 20mg  daily. Take an additional 20mg  daily if your weight is up 3lbs in a day for 5lbs in a week.   Labwork: Bmet today  Testing/Procedures: None ordered  Follow-Up: Your physician recommends that you schedule a follow-up appointment in: 4 weeks with Dr.Jordan    Any Other Special Instructions Will Be Listed Below (If Applicable). Your physician recommends that you weigh, daily, at the same time every day, and in the same amount of clothing. Please record your daily weights.  Limit your sodium intake to 500mg  a meal or 2,000mg  daily        If you need a refill on your cardiac medications before your next appointment, please call your pharmacy.   Two Gram Sodium Diet 2000 mg  What is Sodium? Sodium is a mineral found naturally in many foods. The most significant source of sodium in the diet is table salt, which is about 40% sodium.  Processed, convenience, and preserved foods also contain a large amount of sodium.  The body needs only 500 mg of sodium daily to function,  A normal diet provides more than enough sodium even if you do not use salt.  Why Limit Sodium? A build up of sodium in the body can cause thirst, increased blood pressure, shortness of breath, and water retention.  Decreasing sodium in the diet can reduce edema and risk of heart attack or stroke associated with high blood pressure.  Keep in mind that there are many other factors involved in these health problems.  Heredity, obesity, lack of exercise, cigarette smoking, stress and what you eat all play a role.  General Guidelines:  Do not add salt at the table or in cooking.  One teaspoon of salt contains over 2 grams of sodium.  Read food labels  Avoid processed and convenience foods  Ask your dietitian before eating any foods not dicussed in the menu planning guidelines  Consult your  physician if you wish to use a salt substitute or a sodium containing medication such as antacids.  Limit milk and milk products to 16 oz (2 cups) per day.  Shopping Hints:  READ LABELS!! "Dietetic" does not necessarily mean low sodium.  Salt and other sodium ingredients are often added to foods during processing.   Menu Planning Guidelines Food Group Choose More Often Avoid  Beverages (see also the milk group All fruit juices, low-sodium, salt-free vegetables juices, low-sodium carbonated beverages Regular vegetable or tomato juices, commercially softened water used for drinking or cooking  Breads and Cereals Enriched white, wheat, rye and pumpernickel bread, hard rolls and dinner rolls; muffins, cornbread and waffles; most dry cereals, cooked cereal without added salt; unsalted crackers and breadsticks; low sodium or homemade bread crumbs Bread, rolls and crackers with salted tops; quick breads; instant hot cereals; pancakes; commercial bread stuffing; self-rising flower and biscuit mixes; regular bread crumbs or cracker crumbs  Desserts and Sweets Desserts and sweets mad with mild should be within allowance Instant pudding mixes and cake mixes  Fats Butter or margarine; vegetable oils; unsalted salad dressings, regular salad dressings limited to 1 Tbs; light, sour and heavy cream Regular salad dressings containing bacon fat, bacon bits, and salt pork; snack dips made with instant soup mixes or processed cheese; salted nuts  Fruits Most fresh, frozen and canned fruits Fruits processed with salt or sodium-containing ingredient (some dried fruits are processed with sodium sulfites  Vegetables Fresh, frozen vegetables and low- sodium canned vegetables Regular canned vegetables, sauerkraut, pickled vegetables, and others prepared in brine; frozen vegetables in sauces; vegetables seasoned with ham, bacon or salt pork  Condiments, Sauces, Miscellaneous  Salt substitute with physician's  approval; pepper, herbs, spices; vinegar, lemon or lime juice; hot pepper sauce; garlic powder, onion powder, low sodium soy sauce (1 Tbs.); low sodium condiments (ketchup, chili sauce, mustard) in limited amounts (1 tsp.) fresh ground horseradish; unsalted tortilla chips, pretzels, potato chips, popcorn, salsa (1/4 cup) Any seasoning made with salt including garlic salt, celery salt, onion salt, and seasoned salt; sea salt, rock salt, kosher salt; meat tenderizers; monosodium glutamate; mustard, regular soy sauce, barbecue, sauce, chili sauce, teriyaki sauce, steak sauce, Worcestershire sauce, and most flavored vinegars; canned gravy and mixes; regular condiments; salted snack foods, olives, picles, relish, horseradish sauce, catsup   Food preparation: Try these seasonings Meats:    Pork Sage, onion Serve with applesauce  Chicken Poultry seasoning, thyme, parsley Serve with cranberry sauce  Lamb Curry powder, rosemary, garlic, thyme Serve with mint sauce or jelly  Veal Marjoram, basil Serve with current jelly, cranberry sauce  Beef Pepper, bay leaf Serve with dry mustard, unsalted chive butter  Fish [place] leaf, dill Serve with unsalted lemon butter, unsalted parsley butter  Vegetables:    Asparagus Lemon juice   Broccoli Lemon juice   Carrots Mustard dressing parsley, mint, nutmeg, glazed with unsalted butter and sugar   Green beans Marjoram, lemon juice, nutmeg,dill seed   Tomatoes Basil, marjoram, onion   Spice /blend for [employer]" 4 tsp ground thyme 1 tsp ground sage 3 tsp ground rosemary 4 tsp ground marjoram   Test your knowledge 1. A product that says "Salt Free" may still contain sodium. True or False 2. Garlic Powder and Hot Pepper Sauce an be used as alternative seasonings.True or False 3. Processed foods have more sodium than fresh foods.  True or False 4. Canned Vegetables have less sodium than froze True or False  WAYS TO DECREASE YOUR SODIUM INTAKE 1. Avoid the use of added  salt in cooking and at the table.  Table salt (and other prepared seasonings which contain salt) is probably one of the greatest sources of sodium in the diet.  Unsalted foods can gain flavor from the sweet, sour, and butter taste sensations of herbs and spices.  Instead of using salt for seasoning, try the following seasonings with the foods listed.  Remember: how you use them to enhance natural food flavors is limited only by your creativity... Allspice-Meat, fish, eggs, fruit, peas, red and yellow vegetables Almond Extract-Fruit baked goods Anise Seed-Sweet breads, fruit, carrots, beets, cottage cheese, cookies (tastes like licorice) Basil-Meat, fish, eggs, vegetables, rice, vegetables salads, soups, sauces Bay Leaf-Meat, fish, stews, poultry Burnet-Salad, vegetables (cucumber-like flavor) Caraway Seed-Bread, cookies, cottage cheese, meat, vegetables, cheese, rice Cardamon-Baked goods, fruit, soups Celery Powder or seed-Salads, salad dressings, sauces, meatloaf, soup, bread.Do not use  celery salt Chervil-Meats, salads, fish, eggs, vegetables, cottage cheese (parsley-like flavor) Chili Power-Meatloaf, chicken cheese, corn, eggplant, egg dishes Chives-Salads cottage cheese, egg dishes, soups, vegetables, sauces Cilantro-Salsa, casseroles Cinnamon-Baked goods, fruit, pork, lamb, chicken, carrots Cloves-Fruit, baked goods, fish, pot roast, green beans, beets, carrots Coriander-Pastry, cookies, meat, salads, cheese (lemon-orange flavor) Cumin-Meatloaf, fish,cheese, eggs, cabbage,fruit pie (caraway flavor) [employer], fruit, eggs, fish, poultry, cottage cheese, vegetables Dill Seed-Meat, cottage cheese, poultry, vegetables, fish, salads, bread Fennel Seed-Bread, cookies, apples, pork, eggs, fish, beets, cabbage, cheese, Licorice-like flavor Garlic-(buds or powder)  Salads, meat, poultry, fish, bread, butter, vegetables, potatoes.Do not  use garlic salt Ginger-Fruit, vegetables, baked goods,  meat, fish, poultry Horseradish Root-Meet, vegetables, butter Lemon Juice or Extract-Vegetables, fruit, tea, baked goods, fish salads Mace-Baked goods fruit, vegetables, fish, poultry (taste like nutmeg) Maple Extract-Syrups Marjoram-Meat, chicken, fish, vegetables, breads, green salads (taste like Sage) Mint-Tea, lamb, sherbet, vegetables, desserts, carrots, cabbage Mustard, Dry or Seed-Cheese, eggs, meats, vegetables, poultry Nutmeg-Baked goods, fruit, chicken, eggs, vegetables, desserts Onion Powder-Meat, fish, poultry, vegetables, cheese, eggs, bread, rice salads (Do not use   Onion salt) Orange Extract-Desserts, baked goods Oregano-Pasta, eggs, cheese, onions, pork, lamb, fish, chicken, vegetables, green salads Paprika-Meat, fish, poultry, eggs, cheese, vegetables Parsley Flakes-Butter, vegetables, meat fish, poultry, eggs, bread, salads (certain forms may   Contain sodium Pepper-Meat fish, poultry, vegetables, eggs Peppermint Extract-Desserts, baked goods Poppy Seed-Eggs, bread, cheese, fruit dressings, baked goods, noodles, vegetables, cottage  [employer], poultry, meat, fish, cauliflower, turnips,eggs bread Saffron-Rice, bread, veal, chicken, fish, eggs Sage-Meat, fish, poultry, onions, eggplant, tomateos, pork, stews Savory-Eggs, salads, poultry, meat, rice, vegetables, soups, pork Tarragon-Meat, poultry, fish, eggs, butter, vegetables (licorice-like flavor)  Thyme-Meat, poultry, fish, eggs, vegetables, (clover-like flavor), sauces, soups Tumeric-Salads, butter, eggs, fish, rice, vegetables (saffron-like flavor) Vanilla Extract-Baked goods, candy Vinegar-Salads, vegetables, meat marinades Walnut Extract-baked goods, candy  2. Choose your Foods Wisely   The following is a list of foods to avoid which are high in sodium:  Meats-Avoid all smoked, canned, salt cured, dried and kosher meat and fish as well  as Anchovies   [employer] meats:Bologna, Liverwurst, Pastrami Canned meat or fish  Marinated herring Caviar    Pepperoni Corned Beef   Pizza Dried chipped beef  Salami Frozen breaded fish or meat Salt pork Frankfurters or hot dogs  Sardines Gefilte fish   Sausage Ham (boiled ham, Proscuitto Smoked butt    spiced ham)   Spam      TV Dinners Vegetables Canned vegetables (Regular) Relish Canned mushrooms  Sauerkraut Olives    Tomato juice Pickles  Bakery and Dessert Products Canned puddings  Cream pies Cheesecake   Decorated cakes Cookies  Beverages/Juices Tomato juice, regular  Gatorade   V-8 vegetable juice, regular  Breads and Cereals Biscuit mixes   Salted potato chips, corn chips, pretzels Bread stuffing mixes  Salted crackers and rolls Pancake and waffle mixes Self-rising flour  Seasonings Accent    Meat sauces Barbecue sauce  Meat tenderizer Catsup    Monosodium glutamate (MSG) Celery salt   Onion salt Chili sauce   Prepared mustard Garlic salt   Salt, seasoned salt, sea salt Gravy mixes   Soy sauce Horseradish   Steak sauce Ketchup   Tartar sauce Lite salt    Teriyaki sauce Marinade mixes   Worcestershire sauce  Others Baking powder   Cocoa and cocoa mixes Baking soda   Commercial casserole mixes Candy-caramels, chocolate  Dehydrated soups    Bars, fudge,nougats  Instant rice and pasta mixes Canned broth or soup  Maraschino cherries Cheese, aged and processed cheese and cheese spreads  Learning Assessment Quiz  Indicated T (for True) or F (for False) for each of the following statements:  1. _____ Fresh fruits and vegetables and unprocessed grains are generally low in sodium 2. _____ Water may contain a considerable amount of sodium, depending on the source 3. _____ You can always tell if a food is high in sodium by tasting it 4. _____ Certain laxatives my be high in sodium and  should be avoided unless prescribed   by a physician or  pharmacist 5. _____ Salt substitutes may be used freely by anyone on a sodium restricted diet 6. _____ Sodium is present in table salt, food additives and as a natural component of   most foods 7. _____ Table salt is approximately 90% sodium 8. _____ Limiting sodium intake may help prevent excess fluid accumulation in the body 9. _____ On a sodium-restricted diet, seasonings such as bouillon soy sauce, and    cooking wine should be used in place of table salt 10. _____ On an ingredient list, a product which lists monosodium glutamate as the first   ingredient is an appropriate food to include on a low sodium diet  Circle the best answer(s) to the following statements (Hint: there may be more than one correct answer)  11. On a low-sodium diet, some acceptable snack items are:    A. Olives  F. Bean dip   K. Grapefruit juice    B. Salted Pretzels G. Commercial Popcorn   L. Canned peaches    C. Carrot Sticks  H. Bouillon   M. Unsalted nuts   D. [place] fries  I. Peanut butter crackers N. Salami   E. Sweet pickles J. Tomato Juice   O. Pizza  12.  Seasonings that may be used freely on a reduced - sodium diet include   A. Lemon wedges F.Monosodium glutamate K. Celery seed    B.Soysauce   G. Pepper   L. Mustard powder   C. Sea salt  H. Cooking wine  M. Onion flakes   D. Vinegar  E. Prepared horseradish N. Salsa   E. Sage   J. Worcestershire sauce  O. Chutney

## 2018-04-17 DIAGNOSIS — I5023 Acute on chronic systolic (congestive) heart failure: Secondary | ICD-10-CM | POA: Diagnosis not present

## 2018-04-17 DIAGNOSIS — J690 Pneumonitis due to inhalation of food and vomit: Secondary | ICD-10-CM | POA: Diagnosis not present

## 2018-04-17 DIAGNOSIS — I2510 Atherosclerotic heart disease of native coronary artery without angina pectoris: Secondary | ICD-10-CM | POA: Diagnosis not present

## 2018-04-17 DIAGNOSIS — J9621 Acute and chronic respiratory failure with hypoxia: Secondary | ICD-10-CM | POA: Diagnosis not present

## 2018-04-17 DIAGNOSIS — E1122 Type 2 diabetes mellitus with diabetic chronic kidney disease: Secondary | ICD-10-CM | POA: Diagnosis not present

## 2018-04-17 DIAGNOSIS — I255 Ischemic cardiomyopathy: Secondary | ICD-10-CM | POA: Diagnosis not present

## 2018-04-17 LAB — BASIC METABOLIC PANEL
BUN / CREAT RATIO: 23 (ref 10–24)
BUN: 45 mg/dL — AB (ref 8–27)
CALCIUM: 9.5 mg/dL (ref 8.6–10.2)
CO2: 24 mmol/L (ref 20–29)
Chloride: 101 mmol/L (ref 96–106)
Creatinine, Ser: 1.94 mg/dL — ABNORMAL HIGH (ref 0.76–1.27)
GFR, EST AFRICAN AMERICAN: 36 mL/min/{1.73_m2} — AB (ref >59)
GFR, EST NON AFRICAN AMERICAN: 31 mL/min/{1.73_m2} — AB (ref >59)
Glucose: 95 mg/dL (ref 65–99)
POTASSIUM: 5 mmol/L (ref 3.5–5.2)
Sodium: 142 mmol/L (ref 134–144)

## 2018-04-18 ENCOUNTER — Telehealth: Payer: Self-pay | Admitting: Adult Health

## 2018-04-18 ENCOUNTER — Ambulatory Visit (INDEPENDENT_AMBULATORY_CARE_PROVIDER_SITE_OTHER)
Admission: RE | Admit: 2018-04-18 | Discharge: 2018-04-18 | Disposition: A | Discharge status: Home / Self Care | Payer: Medicare Other | Source: Ambulatory Visit | Attending: Nurse Practitioner | Admitting: Nurse Practitioner

## 2018-04-18 DIAGNOSIS — J440 Chronic obstructive pulmonary disease with acute lower respiratory infection: Secondary | ICD-10-CM | POA: Diagnosis not present

## 2018-04-18 DIAGNOSIS — J690 Pneumonitis due to inhalation of food and vomit: Secondary | ICD-10-CM | POA: Diagnosis not present

## 2018-04-18 DIAGNOSIS — J181 Lobar pneumonia, unspecified organism: Secondary | ICD-10-CM | POA: Diagnosis not present

## 2018-04-18 DIAGNOSIS — K220 Achalasia of cardia: Secondary | ICD-10-CM | POA: Diagnosis not present

## 2018-04-18 DIAGNOSIS — I5022 Chronic systolic (congestive) heart failure: Secondary | ICD-10-CM | POA: Diagnosis not present

## 2018-04-18 NOTE — Telephone Encounter (Signed)
Received call report from [facility] with [facility]  AFB ordered by TP at the 02-22-18 office visit Partial sputum obtained from the patient on 03/28/18 @ 4:44pm Preliminary results are abnormal with AFB present in the liquid culture medium Identification to follow   Will route results to TP

## 2018-04-18 NOTE — Progress Notes (Signed)
Reviewed, agree 

## 2018-04-18 NOTE — Telephone Encounter (Signed)
partial sputum obtained 03/28/18 @ 4:44pm preliminary results abnormal with AFB present in the liquid culture medium identification to follow

## 2018-04-18 NOTE — Telephone Encounter (Signed)
Mindi Junker, the below is an email from the pt's son with update. Just an FYI.   Just an update from his son. Dad is feeling fine. While on the way today to have chest xray he coughed just a little but was able to cough up some phlegm. I've attached a photo. It was between the size of a quarter and a half dollar. His oxygen sats prior were good at 96% on room air. He has not complained of any issues nor any shortness of breath and went for his morning walk with no issues other than rebuilding strength gradually. He also has been the flutter valve and other breathing exercises daily. Chest xray completed at 1230pm today.

## 2018-04-19 ENCOUNTER — Telehealth: Payer: Self-pay | Admitting: Pulmonary Disease

## 2018-04-19 DIAGNOSIS — I2510 Atherosclerotic heart disease of native coronary artery without angina pectoris: Secondary | ICD-10-CM | POA: Diagnosis not present

## 2018-04-19 DIAGNOSIS — I5023 Acute on chronic systolic (congestive) heart failure: Secondary | ICD-10-CM | POA: Diagnosis not present

## 2018-04-19 DIAGNOSIS — E1122 Type 2 diabetes mellitus with diabetic chronic kidney disease: Secondary | ICD-10-CM | POA: Diagnosis not present

## 2018-04-19 DIAGNOSIS — J9621 Acute and chronic respiratory failure with hypoxia: Secondary | ICD-10-CM | POA: Diagnosis not present

## 2018-04-19 DIAGNOSIS — I255 Ischemic cardiomyopathy: Secondary | ICD-10-CM | POA: Diagnosis not present

## 2018-04-19 DIAGNOSIS — J690 Pneumonitis due to inhalation of food and vomit: Secondary | ICD-10-CM | POA: Diagnosis not present

## 2018-04-19 NOTE — Telephone Encounter (Signed)
Message closed in error. 

## 2018-04-19 NOTE — Telephone Encounter (Signed)
Aware see lab results.  Pt to come in to discuss with Dr. Pennie Banter

## 2018-04-19 NOTE — Telephone Encounter (Signed)
I called Antonio Ray to let him know that at this time I think the best approach is to hold off on referring to ID.  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    We will continue to monitor closely.  Roselie Awkward, MD [facility] Pager: [phone] Cell: [phone] After 3pm or if no response, call [phone]

## 2018-04-19 NOTE — Telephone Encounter (Signed)
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  Will route to Rexene Edison NP.    Please advise, thank you.

## 2018-04-19 NOTE — Telephone Encounter (Signed)
Noted  

## 2018-04-19 NOTE — Telephone Encounter (Signed)
I called pt but there was no answer. Unable to leave message on VM. Will try again later.

## 2018-04-20 DIAGNOSIS — E1122 Type 2 diabetes mellitus with diabetic chronic kidney disease: Secondary | ICD-10-CM | POA: Diagnosis not present

## 2018-04-20 DIAGNOSIS — J690 Pneumonitis due to inhalation of food and vomit: Secondary | ICD-10-CM | POA: Diagnosis not present

## 2018-04-20 DIAGNOSIS — J9621 Acute and chronic respiratory failure with hypoxia: Secondary | ICD-10-CM | POA: Diagnosis not present

## 2018-04-20 DIAGNOSIS — I255 Ischemic cardiomyopathy: Secondary | ICD-10-CM | POA: Diagnosis not present

## 2018-04-20 DIAGNOSIS — I2510 Atherosclerotic heart disease of native coronary artery without angina pectoris: Secondary | ICD-10-CM | POA: Diagnosis not present

## 2018-04-20 DIAGNOSIS — I5023 Acute on chronic systolic (congestive) heart failure: Secondary | ICD-10-CM | POA: Diagnosis not present

## 2018-04-23 DIAGNOSIS — L84 Corns and callosities: Secondary | ICD-10-CM | POA: Diagnosis not present

## 2018-04-23 DIAGNOSIS — L97512 Non-pressure chronic ulcer of other part of right foot with fat layer exposed: Secondary | ICD-10-CM | POA: Diagnosis not present

## 2018-04-23 DIAGNOSIS — I739 Peripheral vascular disease, unspecified: Secondary | ICD-10-CM | POA: Diagnosis not present

## 2018-04-23 DIAGNOSIS — L603 Nail dystrophy: Secondary | ICD-10-CM | POA: Diagnosis not present

## 2018-04-23 DIAGNOSIS — E1151 Type 2 diabetes mellitus with diabetic peripheral angiopathy without gangrene: Secondary | ICD-10-CM | POA: Diagnosis not present

## 2018-04-24 ENCOUNTER — Encounter (HOSPITAL_COMMUNITY): Payer: Self-pay | Admitting: Emergency Medicine

## 2018-04-24 ENCOUNTER — Emergency Department (HOSPITAL_COMMUNITY): Payer: Medicare Other

## 2018-04-24 ENCOUNTER — Emergency Department (HOSPITAL_COMMUNITY)
Admission: EM | Admit: 2018-04-24 | Discharge: 2018-04-24 | Disposition: A | Discharge status: Home / Self Care | Payer: Medicare Other | Attending: Emergency Medicine | Admitting: Emergency Medicine

## 2018-04-24 DIAGNOSIS — N183 Chronic kidney disease, stage 3 (moderate): Secondary | ICD-10-CM | POA: Diagnosis not present

## 2018-04-24 DIAGNOSIS — J441 Chronic obstructive pulmonary disease with (acute) exacerbation: Secondary | ICD-10-CM | POA: Diagnosis not present

## 2018-04-24 DIAGNOSIS — Z794 Long term (current) use of insulin: Secondary | ICD-10-CM | POA: Diagnosis not present

## 2018-04-24 DIAGNOSIS — N289 Disorder of kidney and ureter, unspecified: Secondary | ICD-10-CM | POA: Insufficient documentation

## 2018-04-24 DIAGNOSIS — Z95810 Presence of automatic (implantable) cardiac defibrillator: Secondary | ICD-10-CM | POA: Diagnosis not present

## 2018-04-24 DIAGNOSIS — Z85828 Personal history of other malignant neoplasm of skin: Secondary | ICD-10-CM | POA: Insufficient documentation

## 2018-04-24 DIAGNOSIS — R0689 Other abnormalities of breathing: Secondary | ICD-10-CM | POA: Diagnosis not present

## 2018-04-24 DIAGNOSIS — Z7982 Long term (current) use of aspirin: Secondary | ICD-10-CM | POA: Diagnosis not present

## 2018-04-24 DIAGNOSIS — R05 Cough: Secondary | ICD-10-CM | POA: Diagnosis not present

## 2018-04-24 DIAGNOSIS — I252 Old myocardial infarction: Secondary | ICD-10-CM | POA: Diagnosis not present

## 2018-04-24 DIAGNOSIS — Z7902 Long term (current) use of antithrombotics/antiplatelets: Secondary | ICD-10-CM | POA: Diagnosis not present

## 2018-04-24 DIAGNOSIS — I5043 Acute on chronic combined systolic (congestive) and diastolic (congestive) heart failure: Secondary | ICD-10-CM

## 2018-04-24 DIAGNOSIS — D649 Anemia, unspecified: Secondary | ICD-10-CM | POA: Insufficient documentation

## 2018-04-24 DIAGNOSIS — I491 Atrial premature depolarization: Secondary | ICD-10-CM | POA: Diagnosis not present

## 2018-04-24 DIAGNOSIS — E1122 Type 2 diabetes mellitus with diabetic chronic kidney disease: Secondary | ICD-10-CM | POA: Diagnosis not present

## 2018-04-24 DIAGNOSIS — R0602 Shortness of breath: Secondary | ICD-10-CM | POA: Diagnosis not present

## 2018-04-24 DIAGNOSIS — I2510 Atherosclerotic heart disease of native coronary artery without angina pectoris: Secondary | ICD-10-CM | POA: Diagnosis not present

## 2018-04-24 DIAGNOSIS — R0902 Hypoxemia: Secondary | ICD-10-CM | POA: Diagnosis not present

## 2018-04-24 DIAGNOSIS — R001 Bradycardia, unspecified: Secondary | ICD-10-CM | POA: Diagnosis not present

## 2018-04-24 DIAGNOSIS — Z79899 Other long term (current) drug therapy: Secondary | ICD-10-CM | POA: Insufficient documentation

## 2018-04-24 DIAGNOSIS — I959 Hypotension, unspecified: Secondary | ICD-10-CM | POA: Diagnosis not present

## 2018-04-24 LAB — BASIC METABOLIC PANEL
ANION GAP: 10 (ref 5–15)
BUN: 36 mg/dL — ABNORMAL HIGH (ref 8–23)
CALCIUM: 9 mg/dL (ref 8.9–10.3)
CO2: 24 mmol/L (ref 22–32)
Chloride: 104 mmol/L (ref 98–111)
Creatinine, Ser: 1.81 mg/dL — ABNORMAL HIGH (ref 0.61–1.24)
GFR calc non Af Amer: 33 mL/min — ABNORMAL LOW (ref >60)
GFR, EST AFRICAN AMERICAN: 38 mL/min — AB (ref >60)
Glucose, Bld: 145 mg/dL — ABNORMAL HIGH (ref 70–99)
Potassium: 3.9 mmol/L (ref 3.5–5.1)
SODIUM: 138 mmol/L (ref 135–145)

## 2018-04-24 LAB — CBC
HCT: 35.6 % — ABNORMAL LOW (ref 39.0–52.0)
HEMOGLOBIN: 11 g/dL — AB (ref 13.0–17.0)
MCH: 28.1 pg (ref 26.0–34.0)
MCHC: 30.9 g/dL (ref 30.0–36.0)
MCV: 90.8 fL (ref 78.0–100.0)
Platelets: 278 10*3/uL (ref 150–400)
RBC: 3.92 MIL/uL — AB (ref 4.22–5.81)
RDW: 14.3 % (ref 11.5–15.5)
WBC: 9.4 10*3/uL (ref 4.0–10.5)

## 2018-04-24 LAB — DIFFERENTIAL
ABS IMMATURE GRANULOCYTES: 0 10*3/uL (ref 0.0–0.1)
BASOS ABS: 0 10*3/uL (ref 0.0–0.1)
BASOS PCT: 0 %
Eosinophils Absolute: 0.3 10*3/uL (ref 0.0–0.7)
Eosinophils Relative: 4 %
Immature Granulocytes: 0 %
Lymphocytes Relative: 25 %
Lymphs Abs: 2.4 10*3/uL (ref 0.7–4.0)
Monocytes Absolute: 0.9 10*3/uL (ref 0.1–1.0)
Monocytes Relative: 9 %
NEUTROS PCT: 62 %
Neutro Abs: 6 10*3/uL (ref 1.7–7.7)

## 2018-04-24 LAB — BRAIN NATRIURETIC PEPTIDE: B Natriuretic Peptide: 1049.4 pg/mL — ABNORMAL HIGH (ref 0.0–100.0)

## 2018-04-24 MED ORDER — ALBUTEROL SULFATE (2.5 MG/3ML) 0.083% IN NEBU
5.0000 mg | INHALATION_SOLUTION | Freq: Once | RESPIRATORY_TRACT | Status: AC
  Administered 2018-04-24: 5 mg via RESPIRATORY_TRACT
  Filled 2018-04-24: qty 6

## 2018-04-24 MED ORDER — PREDNISONE 20 MG PO TABS: ORAL_TABLET | ORAL | 0 refills | Status: DC

## 2018-04-24 MED ORDER — FUROSEMIDE 10 MG/ML IJ SOLN
40.0000 mg | Freq: Once | INTRAMUSCULAR | Status: AC
  Administered 2018-04-24: 40 mg via INTRAVENOUS
  Filled 2018-04-24: qty 4

## 2018-04-24 MED ORDER — PREDNISONE 20 MG PO TABS
60.0000 mg | ORAL_TABLET | Freq: Once | ORAL | Status: AC
  Administered 2018-04-24: 60 mg via ORAL
  Filled 2018-04-24: qty 3

## 2018-04-24 NOTE — Telephone Encounter (Signed)
BQ - FYI 

## 2018-04-24 NOTE — ED Notes (Signed)
Ambulated pt in hallway. o2 sat 93% RA, HR 87. Pt states slight SOB, pt denies dizziness or other issues.

## 2018-04-24 NOTE — ED Notes (Signed)
Pt here by EMS for acute on chronic COPD.  Has O2 at home but does not use it regularly.  Started 2L O2 nasal cannula prior to EMS arrival.  EMS treated with 2 duoneb treatments.  Pt refused steroid treatment en route to ED.  22g left thumb saline locked.  A&Ox4. Noted increased work of breathing.

## 2018-04-24 NOTE — ED Provider Notes (Signed)
[place] EMERGENCY DEPARTMENT Provider Note   CSN: [id number] Arrival date & time: 04/24/18  0342     History   Chief Complaint Chief Complaint  Patient presents with  . COPD    HPI Antonio Ray is a 84 y.o. male.  The history is provided by the patient.  He has history of diabetes, hyperlipidemia, coronary artery disease, combined systolic and diastolic heart failure, COPD and comes in with difficulty breathing over the last 3 days.  There has been a cough productive of some white sputum.  He denies chest pain, heaviness, tightness, pressure.  He denies fever, chills, sweats.  He has chronic edema, but his weight has been stable at 167 pounds.  Breathing is worse with laying flat and with exertion.  At home, he used an albuterol inhaler which did give some slight relief.  He was brought in by EMS and noted wheezing and gave him to nebulizer treatments with some improvement.  He is reported to have refused steroids in the ambulance.  Past Medical History:  Diagnosis Date  . Achalasia   . AICD (automatic cardioverter/defibrillator) present 03/30/2011   Analyze ST study patient  . BPH (benign prostatic hyperplasia)   . Chronic systolic dysfunction of left ventricle    EF 30-35%, CLASS II - III SYMPTOMS; intolerant to Coreg  . CKD (chronic kidney disease) stage 3, GFR 30-59 ml/min ([name]) 06/08/2012  . COPD (chronic obstructive pulmonary disease) ([place])   . Coronary artery disease    History of remote anterior MI with PCI to LAD in 2006; most recent cath 2007, no intervention required  . DOE (dyspnea on exertion)    with heavy exertion  . Dyspnea   . Dysrhythmia   . Esophageal dysmotility   . GERD (gastroesophageal reflux disease)   . Head injury, closed, with concussion 2000ish  . Heart murmur    hx  . HOH (hard of hearing)   . Hyperlipidemia   . Memory loss    improved  . Pneumonia "several times"   aspiration pna with at least 3 admits for this 2016.    Marland Kitchen PVC's (premature ventricular contractions)   . Renal failure   . Skin cancer "several"   "forearms; head"  . Type II diabetes mellitus [facility])     Patient Active Problem List   Diagnosis Date Noted  . Goals of care, counseling/discussion   . Palliative care by specialist   . Altered mental status 04/05/2018  . Acute hepatic encephalopathy   . Chronic respiratory failure with hypoxia ([place]) 02/22/2018  . Acute on chronic respiratory failure with hypoxia ([place]) 02/13/2018  . Aspiration pneumonia of both lower lobes due to gastric secretions ([place]) 01/02/2018  . SIRS (systemic inflammatory response syndrome) ([place]) 01/01/2018  . Generalized weakness 12/19/2017  . Hypoxemia   . Pneumonia, aspiration ([place]) 10/13/2017  . CAP (community acquired pneumonia) 08/19/2017  . Influenza A with pneumonia 08/19/2017  . Skin cancer   . HOH (hard of hearing)   . Heart murmur   . Head injury, closed, with concussion   . Dysrhythmia   . DOE (dyspnea on exertion)   . Anginal pain ([place])   . Pneumonia 06/12/2017  . Acute UTI   . Altered mental state 02/15/2017  . Achalasia of esophagus   . Encephalopathy   . SOB (shortness of breath)   . Acute respiratory failure ([place]) 04/21/2016  . Chronic combined systolic and diastolic congestive heart failure ([place]) 04/21/2016  . Memory loss  04/21/2016  . Allergic rhinitis 04/11/2016  . Mild cognitive impairment 08/18/2015  . AKI (acute kidney injury) ([place])   . Type 2 diabetes mellitus with stage 3 chronic kidney disease, with long-term current use of insulin ([place])   . Chronic kidney disease, stage III (moderate) ([name])   . Other specified hypotension   . Other emphysema ([place])   . AICD (automatic cardioverter/defibrillator) present   . CAD S/P percutaneous coronary angioplasty   . Cardiomyopathy, ischemic 04/03/2015  . Elevated troponin 04/03/2015  . Sepsis ([place]) 03/31/2015  . Aspiration into airway 03/21/2015  . Dysphagia   . Esophageal dysmotilities     . COPD with acute exacerbation ([place]) 01/14/2015  . Left ventricular apical thrombus 01/14/2015  . Acute on chronic systolic congestive heart failure ([place]) 01/14/2015  . ARF (acute renal failure) ([place]) 01/14/2015  . COPD GOLD IV 10/07/2013  . Leukocytosis 09/11/2013  . Acute encephalopathy 09/10/2013  . GERD (gastroesophageal reflux disease) 09/26/2011  . BPH (benign prostatic hyperplasia) 08/22/2011  . COPD (chronic obstructive pulmonary disease) ([place]) 07/17/2011  . Coronary artery disease   . Sick sinus syndrome ([place])   . Acute on chronic combined systolic and diastolic congestive heart failure, NYHA class 3 ([place])   . Hyperlipidemia   . PVC's (premature ventricular contractions)   . Myocardial infarction [facility]) 08/01/1992    Past Surgical History:  Procedure Laterality Date  . BALLOON DILATION N/A 09/09/2015   Procedure: BALLOON DILATION;  Surgeon: Mauri Pole, MD;  Location: [place] ENDOSCOPY;  Service: Endoscopy;  Laterality: N/A;  rigiflex achalasia balloon dilators  . BOTOX INJECTION N/A 04/08/2015   Procedure: BOTOX INJECTION;  Surgeon: Milus Banister, MD;  Location: [facility];  Service: Endoscopy;  Laterality: N/A;  . CARDIAC CATHETERIZATION  01/11/2006   DEMONSTRATES AKINESIA OF THE DISTAL ANTERIOR WALL, DISTAL INFERIOR WALL AND AKINESIA OF THE APEX. THE BASAL SEGMENTS CONTRACT WELL AND OVERALL EF 35%  . CATARACT EXTRACTION W/ INTRAOCULAR LENS  IMPLANT, BILATERAL  08/2014-09/2014  . CHOLECYSTECTOMY  2/11  . CORONARY ANGIOPLASTY  1994   TO THE LAD  . ESOPHAGEAL MANOMETRY N/A 03/16/2015   Procedure: ESOPHAGEAL MANOMETRY (EM);  Surgeon: Jerene Bears, MD;  Location: [facility] ENDOSCOPY;  Service: Gastroenterology;  Laterality: N/A;  . ESOPHAGOGASTRODUODENOSCOPY N/A 04/08/2015   Procedure: ESOPHAGOGASTRODUODENOSCOPY (EGD);  Surgeon: Milus Banister, MD;  Location: [facility];  Service: Endoscopy;  Laterality: N/A;  . ESOPHAGOGASTRODUODENOSCOPY N/A 01/03/2018   Procedure:  ESOPHAGOGASTRODUODENOSCOPY (EGD);  Surgeon: Mauri Pole, MD;  Location[facility] ENDOSCOPY;  Service: Endoscopy;  Laterality: N/A;  . ESOPHAGOGASTRODUODENOSCOPY (EGD) WITH PROPOFOL N/A 09/09/2015   Procedure: ESOPHAGOGASTRODUODENOSCOPY (EGD) WITH PROPOFOL;  Surgeon: Mauri Pole, MD;  Location: [facility] ENDOSCOPY;  Service: Endoscopy;  Laterality: N/A;  pt. is to have gastrografin xray post recovery-do not discharge until results are back  . EYE SURGERY    . FOOT FRACTURE SURGERY Right 1980's  . INSERT / REPLACE / REMOVE PACEMAKER    . SAVORY DILATION N/A 01/03/2018   Procedure: RIGIFLEX DILATION;  Surgeon: Mauri Pole, MD;  Location: [facility] ENDOSCOPY;  Service: Endoscopy;  Laterality: N/A;  . SKIN CANCER EXCISION  "several"   "forearms, head"        Home Medications    Prior to Admission medications   Medication Sig Start Date End Date Taking? Authorizing Provider  albuterol (PROVENTIL HFA;VENTOLIN HFA) 108 (90 Base) MCG/ACT inhaler Inhale 2 puffs into the lungs every 6 (six) hours as needed for wheezing or shortness of breath.  11/16/17   Juanito Doom, MD  aspirin 81 MG EC tablet Take 81 mg by mouth daily.      [provider]  Cholecalciferol (VITAMIN D) 2000 units tablet Take 2,000 Units by mouth daily.     [provider]  donepezil (ARICEPT) 10 MG tablet Take 1 tablet (10 mg total) by mouth every morning. 05/01/17   Marcial Pacas, MD  fenofibrate (TRICOR) 145 MG tablet Take 145 mg by mouth once a day 02/21/18   [place], Peter M, MD  finasteride (PROSCAR) 5 MG tablet Take 5 mg by mouth daily. 05/19/17   [provider]  furosemide (LASIX) 20 MG tablet Take 1 tablet (20 mg total) by mouth daily. Take an additional 1m if your weight is up 3lbs in a day 5lbs in a week 04/16/18   Barrett, REvelene Croon PA-C  glucose blood (FREESTYLE TEST STRIPS) test strip Use as instructed 3x a day 02/05/18   GPhilemon Kingdom MD  insulin aspart (NOVOLOG) 100 UNIT/ML injection  Inject 4-10 Units into the skin 3 (three) times daily before meals. Inject up to 6 units 3x a day before meals - pens please 02/05/18   GPhilemon Kingdom MD  Insulin Glargine ([facility] 100 UNIT/ML SOPN INJECT  6 UNITS UNDER THE SKIN EVERY MORNING Patient not taking: Reported on 04/16/2018 02/05/18   GPhilemon Kingdom MD  Insulin Pen Needle 32G X 4 MM MISC Use 1x a day 02/05/18   GPhilemon Kingdom MD  loratadine (CLARITIN) 10 MG tablet Take 1 tablet (10 mg total) by mouth daily. 07/02/15   MBarton Dubois MD  memantine (NAMENDA) 10 MG tablet Take 1 tablet (10 mg total) by mouth 2 (two) times daily. 05/01/17   YMarcial Pacas MD  Multiple Vitamins-Minerals (PRESERVISION AREDS 2) CAPS Take 1 capsule by mouth 2 (two) times daily.    [provider]  nitroGLYCERIN (NITRODUR - DOSED IN MG/24 HR) 0.2 mg/hr patch Place 1 patch (0.2 mg total) onto the skin daily. Alternates patch placement with odd/even days. 11/22/17   JMartinique Peter M, MD  pantoprazole (PROTONIX) 40 MG tablet Take 1 tablet (40 mg total) by mouth daily. 04/09/18   Hongalgi, ALenis Dickinson MD  pravastatin (PRAVACHOL) 40 MG tablet Take 40 mg by mouth daily.    [provider]  SPIRIVA HANDIHALER 18 MCG inhalation capsule INHALE CONTENTS OF 1 CAPSULE VIA HANDIHALER ONCE DAILY 12/27/17   MJuanito Doom MD  SYMBICORT 160-4.5 MCG/ACT inhaler INHALE 2 PUFFS BY MOUTH TWICE DAILY 02/21/18   MJuanito Doom MD    Family History Family History  Problem Relation Age of Onset  . Stroke Father        Mother history unknown - never met her  . Venous thrombosis Sister   . Colon cancer Neg Hx     Social History Social History   Tobacco Use  . Smoking status: Former Smoker    Packs/day: 1.00    Years: 35.00    Pack years: 35.00    Types: Cigarettes    Last attempt to quit: 08/01/1992    Years since quitting: 25.7  . Smokeless tobacco: Former [employer] developer   Quit date: 1994  Substance Use Topics  . Alcohol use: No    Alcohol/week: 0.0  standard drinks  . Drug use: No     Allergies   Codeine; Dilaudid [hydromorphone hcl]; Flomax [tamsulosin hcl]; Morphine and related; Sulfa antibiotics; Beta adrenergic blockers; and Carvedilol   Review of Systems Review of Systems  All other systems reviewed and are negative.    Physical Exam Updated Vital Signs BP 120/61   Pulse 99   Temp 98.3 F (36.8 C) (Oral)   Resp (!) 26   SpO2 99%   Physical Exam  Nursing note and vitals reviewed.  84 year old male, resting comfortably and in no acute distress. Vital signs are significant for increased respiratory rate. Oxygen saturation is 99%, which is normal. Head is normocephalic and atraumatic. PERRLA, EOMI. Oropharynx is clear. Neck is nontender and supple without adenopathy or JVD. Back is nontender and there is no CVA tenderness. Lungs have bibasilar rales, worse on the right.  There are no wheezes or rhonchi.  He is not currently using accessory muscles of respiration. Chest is nontender. Heart has regular rate and rhythm without murmur. Abdomen is soft, flat, nontender without masses or hepatosplenomegaly and peristalsis is normoactive. Extremities have 2-3+ edema which is worse on the right, full range of motion is present.  Asymmetric edema is chronic with the right side always having more fluid. Skin is warm and dry without rash. Neurologic: Mental status is normal, cranial nerves are intact, there are no motor or sensory deficits.  ED Treatments / Results  Labs (all labs ordered are listed, but only abnormal results are displayed) Labs Reviewed  CBC - Abnormal; Notable for the following components:      Result Value   RBC 3.92 (*)    Hemoglobin 11.0 (*)    HCT 35.6 (*)    All other components within normal limits  BASIC METABOLIC PANEL    EKG EKG Interpretation  Date/Time:  Tuesday April 24 2018 03:44:31 EDT Ventricular Rate:  100 PR Interval:    QRS Duration: 167 QT Interval:  387 QTC  Calculation: 500 R Axis:   -70 Text Interpretation:  Sinus tachycardia Multiform ventricular premature complexes Probable left atrial enlargement Left bundle branch block When compared with ECG of 04/05/2018, Premature ventricular complexes are now present Confirmed by Delora Fuel ([id number]) on 04/24/2018 3:51:17 AM   Radiology No results found.  Procedures Procedures (including critical care time)  Medications Ordered in ED Medications  albuterol (PROVENTIL) (2.5 MG/3ML) 0.083% nebulizer solution 5 mg (5 mg Nebulization Given 04/24/18 0357)     Initial Impression / Assessment and Plan / ED Course  I have reviewed the triage vital signs and the nursing notes.  Pertinent labs & imaging results that were available during my care of the patient were reviewed by me and considered in my medical decision making (see chart for details).  Dyspnea which on my exam seems mostly secondary to heart failure.  However, wheezing had been noted earlier and he did seem to improve with albuterol and ipratropium.  Labs show stable anemia and stable renal insufficiency, BNP still pending.  Chest x-ray is unchanged from prior, official interpretation pending.  Old records are reviewed, and he was discharged from the hospital on September 9 following aspiration pneumonia and COPD exacerbation.  BNP has come back significantly elevated.  He appears to have combination of CHF and COPD.  He continues to be resting comfortably in the ED.  He is given a dose of furosemide and a dose of oral prednisone.  He is ambulated in the emergency department without any oxygen desaturation.  He is felt to be stable for discharge.  He is sent home with instructions to increase his furosemide to 40 mg a day for the next 5 days, continue using his albuterol inhaler as needed.  Also given prescription for a 4-day course of prednisone.  Patient is reluctant to use his inhaler because he states it makes his mucus more thick.  Advised that he  could discuss with his pulmonologist whether he would qualify for home nebulizer.  Return precautions discussed.  Final Clinical Impressions(s) / ED Diagnoses   Final diagnoses:  COPD exacerbation ([place])  Acute on chronic combined systolic and diastolic congestive heart failure ([name])  Renal insufficiency  Normochromic normocytic anemia    ED Discharge Orders         Ordered    predniSONE (DELTASONE) 20 MG tablet     04/24/18 8588           Delora Fuel, MD 50/[birth year]74 [id number]

## 2018-04-24 NOTE — Discharge Instructions (Signed)
Take two furosemide tablets (40 mg) a day for the next five days, then go back to taking one tablet a day.  Continue using your inhaler as needed when you are having trouble breathing.  Return if symptoms are getting worse.

## 2018-04-24 NOTE — ED Notes (Signed)
Pt requesting something to drink. OK per Aaron Edelman pt can have some ice chips. Pt given some ice chips.

## 2018-04-25 DIAGNOSIS — J690 Pneumonitis due to inhalation of food and vomit: Secondary | ICD-10-CM | POA: Diagnosis not present

## 2018-04-25 DIAGNOSIS — I5023 Acute on chronic systolic (congestive) heart failure: Secondary | ICD-10-CM | POA: Diagnosis not present

## 2018-04-25 DIAGNOSIS — I255 Ischemic cardiomyopathy: Secondary | ICD-10-CM | POA: Diagnosis not present

## 2018-04-25 DIAGNOSIS — E1122 Type 2 diabetes mellitus with diabetic chronic kidney disease: Secondary | ICD-10-CM | POA: Diagnosis not present

## 2018-04-25 DIAGNOSIS — I2510 Atherosclerotic heart disease of native coronary artery without angina pectoris: Secondary | ICD-10-CM | POA: Diagnosis not present

## 2018-04-25 DIAGNOSIS — J9621 Acute and chronic respiratory failure with hypoxia: Secondary | ICD-10-CM | POA: Diagnosis not present

## 2018-04-26 ENCOUNTER — Ambulatory Visit: Payer: Medicare Other | Admitting: Cardiology

## 2018-04-26 DIAGNOSIS — I255 Ischemic cardiomyopathy: Secondary | ICD-10-CM | POA: Diagnosis not present

## 2018-04-26 DIAGNOSIS — I2510 Atherosclerotic heart disease of native coronary artery without angina pectoris: Secondary | ICD-10-CM | POA: Diagnosis not present

## 2018-04-26 DIAGNOSIS — J9621 Acute and chronic respiratory failure with hypoxia: Secondary | ICD-10-CM | POA: Diagnosis not present

## 2018-04-26 DIAGNOSIS — E1122 Type 2 diabetes mellitus with diabetic chronic kidney disease: Secondary | ICD-10-CM | POA: Diagnosis not present

## 2018-04-26 DIAGNOSIS — I5023 Acute on chronic systolic (congestive) heart failure: Secondary | ICD-10-CM | POA: Diagnosis not present

## 2018-04-26 DIAGNOSIS — J690 Pneumonitis due to inhalation of food and vomit: Secondary | ICD-10-CM | POA: Diagnosis not present

## 2018-04-27 ENCOUNTER — Ambulatory Visit (INDEPENDENT_AMBULATORY_CARE_PROVIDER_SITE_OTHER): Payer: Medicare Other | Admitting: Primary Care

## 2018-04-27 ENCOUNTER — Other Ambulatory Visit (INDEPENDENT_AMBULATORY_CARE_PROVIDER_SITE_OTHER): Payer: Medicare Other

## 2018-04-27 ENCOUNTER — Encounter: Payer: Self-pay | Admitting: Primary Care

## 2018-04-27 VITALS — BP 120/64 | HR 87 | Ht 68.0 in | Wt 172.6 lb

## 2018-04-27 DIAGNOSIS — I5042 Chronic combined systolic (congestive) and diastolic (congestive) heart failure: Secondary | ICD-10-CM | POA: Diagnosis not present

## 2018-04-27 DIAGNOSIS — Z5181 Encounter for therapeutic drug level monitoring: Secondary | ICD-10-CM

## 2018-04-27 DIAGNOSIS — J449 Chronic obstructive pulmonary disease, unspecified: Secondary | ICD-10-CM | POA: Diagnosis not present

## 2018-04-27 DIAGNOSIS — J9611 Chronic respiratory failure with hypoxia: Secondary | ICD-10-CM

## 2018-04-27 DIAGNOSIS — J441 Chronic obstructive pulmonary disease with (acute) exacerbation: Secondary | ICD-10-CM

## 2018-04-27 LAB — COMPREHENSIVE METABOLIC PANEL
ALT: 18 U/L (ref 0–53)
AST: 21 U/L (ref 0–37)
Albumin: 3.6 g/dL (ref 3.5–5.2)
Alkaline Phosphatase: 43 U/L (ref 39–117)
BUN: 48 mg/dL — AB (ref 6–23)
CO2: 34 meq/L — AB (ref 19–32)
Calcium: 9.5 mg/dL (ref 8.4–10.5)
Chloride: 97 mEq/L (ref 96–112)
Creatinine, Ser: 1.96 mg/dL — ABNORMAL HIGH (ref 0.40–1.50)
GFR: 34.92 mL/min — AB (ref >60.00)
GLUCOSE: 251 mg/dL — AB (ref 70–99)
POTASSIUM: 4.1 meq/L (ref 3.5–5.1)
SODIUM: 138 meq/L (ref 135–145)
Total Bilirubin: 0.6 mg/dL (ref 0.2–1.2)
Total Protein: 7.1 g/dL (ref 6.0–8.3)

## 2018-04-27 LAB — BRAIN NATRIURETIC PEPTIDE: Pro B Natriuretic peptide (BNP): 820 pg/mL — ABNORMAL HIGH (ref 0.0–100.0)

## 2018-04-27 MED ORDER — FLUTICASONE-UMECLIDIN-VILANT 100-62.5-25 MCG/INH IN AEPB: 1.0000 | INHALATION_SPRAY | Freq: Every day | RESPIRATORY_TRACT | 0 refills | Status: AC

## 2018-04-27 NOTE — Patient Instructions (Addendum)
Treatment: - Trial Trelegy 1 puff daily  (Replaces Spirivia and Symbicort) - Back up - Albuterol rescue inhaler every 4-6 hours for shortness of breath or wheezing  - Recommended you wear oxygen at all times when ambulating until next follow-up  Consider nebulizer albuterol or xopenex treatments if needed   Testing: - Checking labs today (BNP and CMET) - Baseline EKG today for consideration of starting daily Azithromycin to prevent COPD exacerbations   Follow-up: Next visit 10/18 at 11:15am with Dr. Lake Bells

## 2018-04-27 NOTE — Progress Notes (Signed)
@Patient  ID: [name], male    DOB: [birth year], 84 y.o.   MRN: [id number]  Chief Complaint  Patient presents with  . Follow-up    SOB with exertion    Referring provider: [name], MD  HPI: 84 year old male, former smoker quit in 1994. PMH acute on chronic resp failure with hypoxia, COPD GOLD IV, emphysema, esophageal dysmotility, type 2 diabetes mellitus, AKI. Patient of Dr. Lake Bells, last seen by NP on 04/16/18.   05/01/2018 Presents today for ED follow-up regarding COPD exacerbation. He is accompanied by his son. Patient is feeling better. He was seen in the emergency room on 09/24 with reports of difficulty breathing x3 days requiring oxygen during the day at home. Brought by EMS, received Albuterol neb treatment. Coughed up large amount of clear sputum. CXR showed stable COPD, improvement in bilateral pleural effusions. Sent home with prednsione x5 days, last day of prednisone is tomorrow. BNP 1049 (1493), increased lasix 40mg  x5 days. Swelling has improved slightly in left leg. New dry weight is 167lbs. Son feels he can get dehydrated easily when lasix dose is increased. Falls asleep easily when not doing anything, son states this is not new and does not think he needs a sleep test. Reports normal sleep. Respiratory rate is 16-18. Remains independent, working with physical therapy on walking and log/core strength. Denies sob, chest pain.     Allergies  Allergen Reactions  . Codeine Other (See Comments)    Gets very angry, disoriented  . Dilaudid [Hydromorphone Hcl] Other (See Comments)    VERY AGITATED, HOSTILE  . Flomax [Tamsulosin Hcl] Shortness Of Breath  . Morphine And Related Other (See Comments)    VERY AGITATED, HOSTILE  . Sulfa Antibiotics Shortness Of Breath  . Beta Adrenergic Blockers Other (See Comments)    Disorientation  . Carvedilol Other (See Comments)    DISORIENTATION    Immunization History  Administered Date(s) Administered  . Influenza Split  05/09/2013, 06/16/2015  . Influenza, High Dose Seasonal PF 06/04/2014, 07/02/2015, 05/17/2017, 04/16/2018  . Influenza, Seasonal, Injecte, Preservative Fre 04/11/2016  . Influenza,inj,Quad PF,6+ Mos 04/11/2016  . Pneumococcal Conjugate-13 12/12/2014  . Pneumococcal Polysaccharide-23 12/22/2008  . Tdap 12/22/2008, 03/20/2013    Past Medical History:  Diagnosis Date  . Achalasia   . AICD (automatic cardioverter/defibrillator) present 03/30/2011   Analyze ST study patient  . BPH (benign prostatic hyperplasia)   . Chronic systolic dysfunction of left ventricle    EF 30-35%, CLASS II - III SYMPTOMS; intolerant to Coreg  . CKD (chronic kidney disease) stage 3, GFR 30-59 ml/min ([name]) 06/08/2012  . COPD (chronic obstructive pulmonary disease) ([place])   . Coronary artery disease    History of remote anterior MI with PCI to LAD in 2006; most recent cath 2007, no intervention required  . DOE (dyspnea on exertion)    with heavy exertion  . Dyspnea   . Dysrhythmia   . Esophageal dysmotility   . GERD (gastroesophageal reflux disease)   . Head injury, closed, with concussion 2000ish  . Heart murmur    hx  . HOH (hard of hearing)   . Hyperlipidemia   . Memory loss    improved  . Pneumonia "several times"   aspiration pna with at least 3 admits for this 2016.   Marland Kitchen PVC's (premature ventricular contractions)   . Renal failure   . Skin cancer "several"   "forearms; head"  . Type II diabetes mellitus ([name])     Tobacco  History: Social History   Tobacco Use  Smoking Status Former Smoker  . Packs/day: 1.00  . Years: 35.00  . Pack years: 35.00  . Types: Cigarettes  . Last attempt to quit: 08/01/1992  . Years since quitting: 25.7  Smokeless Tobacco Former Systems developer  . Quit date: 1994   Counseling given: Not Answered   Outpatient Medications Prior to Visit  Medication Sig Dispense Refill  . albuterol (PROVENTIL HFA;VENTOLIN HFA) 108 (90 Base) MCG/ACT inhaler Inhale 2 puffs into the lungs every  6 (six) hours as needed for wheezing or shortness of breath. 1 Inhaler 2  . aspirin 81 MG EC tablet Take 81 mg by mouth daily.      . Cholecalciferol (VITAMIN D) 2000 units tablet Take 2,000 Units by mouth daily.     Marland Kitchen donepezil (ARICEPT) 10 MG tablet Take 1 tablet (10 mg total) by mouth every morning. (Patient taking differently: Take 10 mg by mouth daily. ) 90 tablet 4  . fenofibrate (TRICOR) 145 MG tablet Take 145 mg by mouth once a day 90 tablet 0  . finasteride (PROSCAR) 5 MG tablet Take 5 mg by mouth daily.  0  . furosemide (LASIX) 20 MG tablet Take 1 tablet (20 mg total) by mouth daily. Take an additional 20mg  if your weight is up 3lbs in a day 5lbs in a week 35 tablet 0  . glucose blood (FREESTYLE TEST STRIPS) test strip Use as instructed 3x a day 200 each 3  . insulin aspart (NOVOLOG) 100 UNIT/ML injection Inject 4-10 Units into the skin 3 (three) times daily before meals. Inject up to 6 units 3x a day before meals - pens please 15 mL 5  . Insulin Glargine (BASAGLAR KWIKPEN) 100 UNIT/ML SOPN INJECT  6 UNITS UNDER THE SKIN EVERY MORNING (Patient taking differently: Inject 6 Units into the skin daily. ) 15 mL 0  . Insulin Pen Needle 32G X 4 MM MISC Use 1x a day 200 each 3  . loratadine (CLARITIN) 10 MG tablet Take 1 tablet (10 mg total) by mouth daily. 30 tablet 1  . memantine (NAMENDA) 10 MG tablet Take 1 tablet (10 mg total) by mouth 2 (two) times daily. 180 tablet 4  . Multiple Vitamins-Minerals (PRESERVISION AREDS 2) CAPS Take 1 capsule by mouth 2 (two) times daily.    . nitroGLYCERIN (NITRODUR - DOSED IN MG/24 HR) 0.2 mg/hr patch Place 1 patch (0.2 mg total) onto the skin daily. Alternates patch placement with odd/even days. 30 patch 6  . pantoprazole (PROTONIX) 40 MG tablet Take 1 tablet (40 mg total) by mouth daily.    . pravastatin (PRAVACHOL) 40 MG tablet Take 40 mg by mouth daily.    . predniSONE (DELTASONE) 20 MG tablet 2 tabs po daily x 4 days 8 tablet 0  . SPIRIVA HANDIHALER 18  MCG inhalation capsule INHALE CONTENTS OF 1 CAPSULE VIA HANDIHALER ONCE DAILY (Patient taking differently: Place 18 mcg into inhaler and inhale daily. ) 30 capsule 11  . SYMBICORT 160-4.5 MCG/ACT inhaler INHALE 2 PUFFS BY MOUTH TWICE DAILY (Patient taking differently: Inhale 2 puffs into the lungs 2 (two) times daily. ) 10.2 g 5   No facility-administered medications prior to visit.     Review of Systems  Review of Systems  Constitutional: Negative.   HENT: Negative.   Respiratory: Positive for cough. Negative for shortness of breath and wheezing.   Cardiovascular: Negative.  Negative for leg swelling.   Physical Exam  BP 120/64 (BP Location:  Left Arm, Cuff Size: Normal)   Pulse 87   Ht 5\' 8"  (1.727 m)   Wt 172 lb 9.6 oz (78.3 kg)   SpO2 96%   BMI 26.24 kg/m  Physical Exam  Constitutional: He is oriented to person, place, and time. He appears well-developed and well-nourished.  Elderly gentleman, no acute distress  HENT:  Head: Normocephalic and atraumatic.  Eyes: Pupils are equal, round, and reactive to light. EOM are normal.  Neck: Normal range of motion. Neck supple.  Cardiovascular: Normal rate.  + 2 BLE edema R>L  Pulmonary/Chest: Effort normal and breath sounds normal.  CTA. O2 sat 96% 2L. NO resp distress or audible wheeze  Musculoskeletal: Normal range of motion.  Neurological: He is alert and oriented to person, place, and time.  Closes eyes during visit, responds to all questions when asked. Alert and oriented.   Skin: Skin is warm and dry.  Psychiatric: He has a normal mood and affect. His behavior is normal. Judgment and thought content normal.     Lab Results:  CBC    Component Value Date/Time   WBC 9.4 04/24/2018 0403   RBC 3.92 (L) 04/24/2018 0403   HGB 11.0 (L) 04/24/2018 0403   HCT 35.6 (L) 04/24/2018 0403   PLT 278 04/24/2018 0403   MCV 90.8 04/24/2018 0403   MCV 93.8 05/19/2012 1022   MCH 28.1 04/24/2018 0403   MCHC 30.9 04/24/2018 0403   RDW  14.3 04/24/2018 0403   LYMPHSABS 2.4 04/24/2018 0403   MONOABS 0.9 04/24/2018 0403   EOSABS 0.3 04/24/2018 0403   BASOSABS 0.0 04/24/2018 0403    BMET    Component Value Date/Time   NA 138 04/27/2018 1254   NA 142 04/16/2018 1451   K 4.1 04/27/2018 1254   CL 97 04/27/2018 1254   CO2 34 (H) 04/27/2018 1254   GLUCOSE 251 (H) 04/27/2018 1254   BUN 48 (H) 04/27/2018 1254   BUN 45 (H) 04/16/2018 1451   CREATININE 1.96 (H) 04/27/2018 1254   CREATININE 1.48 (H) 04/24/2015 1227   CALCIUM 9.5 04/27/2018 1254   GFRNONAA 33 (L) 04/24/2018 0403   GFRAA 38 (L) 04/24/2018 0403    BNP    Component Value Date/Time   BNP 1,049.4 (H) 04/24/2018 0520    ProBNP    Component Value Date/Time   PROBNP 820.0 (H) 04/27/2018 1254    Imaging: Dg Chest 2 View  Result Date: 04/24/2018 CLINICAL DATA:  84 y/o  M; shortness of breath, COPD, cough. EXAM: CHEST - 2 VIEW COMPARISON:  04/18/2018 chest radiograph FINDINGS: Stable cardiac silhouette given projection and technique. Two lead AICD noted. Aortic atherosclerosis with calcification. Small bilateral pleural effusions and increased bibasilar opacities. Hyperinflated lungs compatible with COPD. No pneumothorax. No acute osseous abnormality is evident. IMPRESSION: Stable findings of COPD. Small bilateral pleural effusions and increased bibasilar opacities probably representing associated atelectasis. Electronically Signed   By: Kristine Garbe M.D.   On: 04/24/2018 05:52   Dg Chest 2 View  Result Date: 04/18/2018 CLINICAL DATA:  Aspiration pneumonia of left lower lobe. Follow-up aspiration pneumonia. EXAM: CHEST - 2 VIEW COMPARISON:  CT 04/05/2018 and chest radiograph 04/05/2018 FINDINGS: Heart size is stable with stable left cardiac ICD. Atherosclerotic calcifications at the aortic arch. Densities at both lung bases are suggestive for small effusions. Few patchy densities at the right lung base. Upper lungs are clear. IMPRESSION: Bibasilar  densities, right side greater than left. Findings are suggestive for small effusions and possibly right basilar  atelectasis. Basilar disease has not significantly improved or changed since the exam on 04/05/2018. Electronically Signed   By: Markus Daft M.D.   On: 04/18/2018 19:08   Dg Chest 2 View  Result Date: 04/05/2018 CLINICAL DATA:  Dyspnea, COPD EXAM: CHEST - 2 VIEW COMPARISON:  02/13/2018 chest radiograph. FINDINGS: Stable configuration of 2 lead left subclavian ICD. Stable cardiomediastinal silhouette with mild cardiomegaly. No pneumothorax. Trace bilateral pleural effusions. Cephalization of the pulmonary vasculature without overt pulmonary edema. Hyperinflated lungs. New patchy mild right lung base opacity. IMPRESSION: 1. New mild patchy right lung base opacity, cannot exclude aspiration or pneumonia. Recommend follow-up chest radiographs to resolution. 2. Hyperinflated lungs, compatible with the provided history of COPD. 3. Stable mild cardiomegaly without overt pulmonary edema. Trace bilateral pleural effusions. Electronically Signed   By: Ilona Sorrel M.D.   On: 04/05/2018 09:46   Ct Chest Wo Contrast  Result Date: 04/05/2018 CLINICAL DATA:  Cough, shortness of breath.  Suspected aspiration. EXAM: CT CHEST WITHOUT CONTRAST TECHNIQUE: Multidetector CT imaging of the chest was performed following the standard protocol without IV contrast. COMPARISON:  Chest radiograph April 05, 2018 and CT chest January 01, 2018 FINDINGS: Respiratory motion through the lung bases. CARDIOVASCULAR: Mild cardiomegaly. No pericardial effusion. Moderate coronary artery calcifications. Thoracic aorta is normal in course and caliber with moderate calcific atherosclerosis. MEDIASTINUM/NODES: Greater than expected number mediastinal lymph nodes measuring to 10 mm, unchanged. LEFT cardiac pacemaker via LEFT subclavian venous approach. Decompressed esophagus improved from prior CT. LUNGS/PLEURA: Tracheobronchial tree is patent,  no pneumothorax. Mild bronchial wall thickening. Bilateral upper lobe tree-in-bud infiltrates, to lesser extent lower lobes. Faint ground-glass opacities. Bilateral lower lobe atelectasis, no focal consolidation. Small pleural effusions. Mild centrilobular emphysema. UPPER ABDOMEN: Nonacute. Punctate nonobstructing RIGHT nephrolithiasis. MUSCULOSKELETAL: Nonacute. IMPRESSION: 1. Tree-in-bud infiltrates may be infectious or inflammatory. Small pleural effusions. 2. Lower lobe atelectasis, less likely pneumonia without focal consolidation. 3. Bronchial wall thickening seen with bronchitis or reactive airway disease. Aortic Atherosclerosis (ICD10-I70.0) and Emphysema (ICD10-J43.9). Electronically Signed   By: Elon Alas M.D.   On: 04/05/2018 15:52   US Renal  Result Date: 04/08/2018 CLINICAL DATA:  Acute renal failure EXAM: RENAL / URINARY TRACT ULTRASOUND COMPLETE COMPARISON:  None. FINDINGS: Right Kidney: Length: 10.2 cm. No hydronephrosis or focal mass. Cortical thinning. Left Kidney: Length: 11.1 cm. Echogenicity within normal limits. No mass or hydronephrosis visualized. Bladder: Appears normal for degree of bladder distention. IMPRESSION: 1. Mild cortical thinning associated with the right kidney. The kidneys are otherwise normal. 2. Prostate is prominent in size measuring 4.6 x 3.3 x  3.9 cm. Electronically Signed   By: Dorise Bullion III M.D   On: 04/08/2018 08:58     Assessment & Plan:   COPD GOLD IV - Changing Symbicort/Spiriva to Trelegy to enhance compliance and ease of drug delivery - Considering adding daily azithromycin to decreased amount of COPD exacerbations, checking EKG and LFTs today for baseline and son will discuss further with DR. McQuaid at next visit   COPD with acute exacerbation [facility]) - ED visit for COPD exacerbation on 09/24 - Given prednisone course x 5 days, completes tomorrow - Desat to 87-88% RA when ambulating, recommend wearing oxygen at all times when  ambulating until next follow-up  - Declined nebulizer treatments  - Consider daily azithromycin, obtained baseline EKG and LFTs  - Next follow-up scheduled for Oct 18th   Chronic respiratory failure with hypoxia ([name]) - Stable, O2 sat 96% 2L   Chronic combined systolic  and diastolic congestive heart failure ([name]) - BNP recheck today 820 (1,049). Continue lasix 20mg  daily. If leg swelling worsens or weight +3lbs take 40mg  x2 days. He does appear a little dry on labs, encourage gentle oral hydration.  - ECHO  10/15/17 - EF 20-25%    Martyn Ehrich, NP 05/01/2018

## 2018-04-30 ENCOUNTER — Telehealth: Payer: Self-pay | Admitting: Neurology

## 2018-04-30 NOTE — Telephone Encounter (Signed)
He has been added to Carolyn's schedule, on 05/03/18, in an available slot.  He is coming in for his yearly follow up for his memory loss.

## 2018-04-30 NOTE — Telephone Encounter (Signed)
Patient's son walked into office requesting a yearly follow up for patient - given next available 12/23 apt. Would like to be worked in for earlier apt if possible - memory issues. Best call back [phone]

## 2018-05-01 ENCOUNTER — Encounter: Payer: Self-pay | Admitting: Primary Care

## 2018-05-01 DIAGNOSIS — I5023 Acute on chronic systolic (congestive) heart failure: Secondary | ICD-10-CM | POA: Diagnosis not present

## 2018-05-01 DIAGNOSIS — J690 Pneumonitis due to inhalation of food and vomit: Secondary | ICD-10-CM | POA: Diagnosis not present

## 2018-05-01 DIAGNOSIS — I255 Ischemic cardiomyopathy: Secondary | ICD-10-CM | POA: Diagnosis not present

## 2018-05-01 DIAGNOSIS — J9621 Acute and chronic respiratory failure with hypoxia: Secondary | ICD-10-CM | POA: Diagnosis not present

## 2018-05-01 DIAGNOSIS — I2510 Atherosclerotic heart disease of native coronary artery without angina pectoris: Secondary | ICD-10-CM | POA: Diagnosis not present

## 2018-05-01 DIAGNOSIS — E1122 Type 2 diabetes mellitus with diabetic chronic kidney disease: Secondary | ICD-10-CM | POA: Diagnosis not present

## 2018-05-01 NOTE — Assessment & Plan Note (Addendum)
-   ED visit for COPD exacerbation on 09/24 - Given prednisone course x 5 days, completes tomorrow - Desat to 87-88% RA when ambulating, recommend wearing oxygen at all times when ambulating until next follow-up  - Declined nebulizer treatments  - Consider daily azithromycin, obtained baseline EKG and LFTs  - Next follow-up scheduled for Oct 18th

## 2018-05-01 NOTE — Assessment & Plan Note (Signed)
-   Stable, O2 sat 96% 2L

## 2018-05-01 NOTE — Assessment & Plan Note (Signed)
-   Changing Symbicort/Spiriva to Trelegy to enhance compliance and ease of drug delivery - Considering adding daily azithromycin to decreased amount of COPD exacerbations, checking EKG and LFTs today for baseline and son will discuss further with DR. McQuaid at next visit

## 2018-05-01 NOTE — Assessment & Plan Note (Signed)
-   BNP recheck today 820 (1,049). Continue lasix 20mg  daily. If leg swelling worsens or weight +3lbs take 40mg  x2 days. He does appear a little dry on labs, encourage gentle oral hydration.  - ECHO  10/15/17 - EF 20-25%

## 2018-05-01 NOTE — Progress Notes (Signed)
Reviewed, agree 

## 2018-05-02 DIAGNOSIS — I255 Ischemic cardiomyopathy: Secondary | ICD-10-CM | POA: Diagnosis not present

## 2018-05-02 DIAGNOSIS — I5023 Acute on chronic systolic (congestive) heart failure: Secondary | ICD-10-CM | POA: Diagnosis not present

## 2018-05-02 DIAGNOSIS — J690 Pneumonitis due to inhalation of food and vomit: Secondary | ICD-10-CM | POA: Diagnosis not present

## 2018-05-02 DIAGNOSIS — J9621 Acute and chronic respiratory failure with hypoxia: Secondary | ICD-10-CM | POA: Diagnosis not present

## 2018-05-02 DIAGNOSIS — I2510 Atherosclerotic heart disease of native coronary artery without angina pectoris: Secondary | ICD-10-CM | POA: Diagnosis not present

## 2018-05-02 DIAGNOSIS — E1122 Type 2 diabetes mellitus with diabetic chronic kidney disease: Secondary | ICD-10-CM | POA: Diagnosis not present

## 2018-05-02 NOTE — Progress Notes (Signed)
[facility]  PATIENT: Antonio SHOULTZ DOB: [birth year]   REASON FOR VISIT: Follow-up for memory loss HISTORY FROM: Patient son [name] and daughter-in-law Pam   HISTORY OF PRESENT ILLNESS: Antonio Ray is a 84 years old right-handed male, seen in referred by his primary care physician Dr. Doreene Nest for memory trouble, he is accompanied by his son [name], and daughter [name] at today's clinical visit.  He had a history of hyperlipidemia,diabetes, pacemaker placement, he used to work as a Copy, just retired in 2015, he lived in the same household for 40 years.  [name] his son, noticed patient has gradual onset memory trouble, especially since his retirement in 2015, He has difficulty remembering his password, misplace things, less confident in driving directions. He still able to drive start distance, he has difficulty to carry on complicated conversations, tends to repeat himself, word finding difficulties  I have reviewed most recent note with Dr. Doreene Nest in Dec 12 2014, per record, Aricept was started since April 2016, mild improvement  CAT scan of the brain without contrast April 2016 showed no acute abnormality  Laboratory in April 2016, normal CBC, CMP showed elevated glucose 256, creatinine 1.65, normal TSH 1.15, B12 567   UPDATE Aug 18 2015: He is accompanied by his son [name]. He had pneumonia 6 times since last visit in June 2016, he was diagnosed with aspiration pneumonia, also related to his COPD.  He required intubation on one occasions, he also received endoscopy guided botulism toxin injection for his lower esophagus spasm, he can taste better, swallowing better  He still lives alone, active, still drives. His glucose is elevated.  He use insulin for his DM, he is manage his insulin dose without difficulty, his memory seems to improve up his extensive treatment for pneumonia.  UPDATE May 01 2017: He has gradual onset memory loss, taking  Namenda, Aricept, last clinical visit was in January 2017, He presented to the hospital February 05 2017 for COPD exacerbation, was treated with prednisone, also had congestive heart failure, ejection fraction 20-25%, was again presented to the hospital July urinary tract infection increase UTI, leukocytosis, is treated with antibiotics,  CT head without contrast February 15 2017 showed generalized atrophy supratentorium small vessel disease no acute abnormality.  Laboratory evaluation in 2018 showed Normal BMP, creatinine 1.56,Elevated WBC 16.1, hemoglobin 11 point 7, urine culture showed more than 100,000 CITROBACTER KOSERI A1c was elevated 6.9  He has increased confusion during UTI on July 18th 2018, now gradually recovered to his baseline, continue has slow worsening memory loss only drive short distance, he go out to airport, grocery shopping, pharmacy, he manage with his medications, his son helps checking. UPDATE 10/3/2019CM Mr. Antonio Ray, 84 year old male returns for follow-up with history of memory loss .  Patient reports that he feels his memory is worse.  He has had multiple admissions to the hospital  this year 5 just since May.  Two for COPD exacerbations, one for CHF 1 for fever 1 for sepsis.  He continues to live alone with family checking on him frequently.  He had his esophagus stretched earlier this year and his appetite has improved.  He sleeps well at night.  He uses oxygen as needed.  Son reports that he sometimes gets anxious and then gets out of his routine.  No hallucinations.  He remains on Namenda and Aricept.  No safety issues identified.  No recent falls.  He returns for reevaluation   REVIEW OF SYSTEMS: Full 14  system review of systems performed and notable only for those listed, all others are neg:  Constitutional: neg  Cardiovascular: Leg swelling Ear/Nose/Throat: neg  Skin: neg Eyes: neg Respiratory: neg Gastroitestinal: neg  Hematology/Lymphatic: neg  Endocrine:  neg Musculoskeletal:neg Allergy/Immunology: neg Neurological: Memory loss, word finding difficulty Psychiatric: Anxiety  Sleep : neg   ALLERGIES: Allergies  Allergen Reactions  . Codeine Other (See Comments)    Gets very angry, disoriented  . Dilaudid [Hydromorphone Hcl] Other (See Comments)    VERY AGITATED, HOSTILE  . Flomax [Tamsulosin Hcl] Shortness Of Breath  . Morphine And Related Other (See Comments)    VERY AGITATED, HOSTILE  . Sulfa Antibiotics Shortness Of Breath  . Beta Adrenergic Blockers Other (See Comments)    Disorientation  . Carvedilol Other (See Comments)    DISORIENTATION    HOME MEDICATIONS: Outpatient Medications Prior to Visit  Medication Sig Dispense Refill  . albuterol (PROVENTIL HFA;VENTOLIN HFA) 108 (90 Base) MCG/ACT inhaler Inhale 2 puffs into the lungs every 6 (six) hours as needed for wheezing or shortness of breath. 1 Inhaler 2  . aspirin 81 MG EC tablet Take 81 mg by mouth daily.      . Cholecalciferol (VITAMIN D) 2000 units tablet Take 2,000 Units by mouth daily.     Marland Kitchen donepezil (ARICEPT) 10 MG tablet Take 1 tablet (10 mg total) by mouth every morning. (Patient taking differently: Take 10 mg by mouth daily. ) 90 tablet 4  . fenofibrate (TRICOR) 145 MG tablet Take 145 mg by mouth once a day 90 tablet 0  . finasteride (PROSCAR) 5 MG tablet Take 5 mg by mouth daily.  0  . Fluticasone-Umeclidin-Vilant (TRELEGY ELLIPTA IN) Inhale into the lungs.    . furosemide (LASIX) 20 MG tablet Take 1 tablet (20 mg total) by mouth daily. Take an additional 86m if your weight is up 3lbs in a day 5lbs in a week 35 tablet 0  . glucose blood (FREESTYLE TEST STRIPS) test strip Use as instructed 3x a day 200 each 3  . insulin aspart (NOVOLOG) 100 UNIT/ML injection Inject 4-10 Units into the skin 3 (three) times daily before meals. Inject up to 6 units 3x a day before meals - pens please 15 mL 5  . Insulin Glargine (BASAGLAR KWIKPEN) 100 UNIT/ML SOPN INJECT  6 UNITS  UNDER THE SKIN EVERY MORNING (Patient taking differently: Inject 6 Units into the skin daily. ) 15 mL 0  . Insulin Pen Needle 32G X 4 MM MISC Use 1x a day 200 each 3  . loratadine (CLARITIN) 10 MG tablet Take 1 tablet (10 mg total) by mouth daily. 30 tablet 1  . memantine (NAMENDA) 10 MG tablet Take 1 tablet (10 mg total) by mouth 2 (two) times daily. 180 tablet 4  . Multiple Vitamins-Minerals (PRESERVISION AREDS 2) CAPS Take 1 capsule by mouth 2 (two) times daily.    . nitroGLYCERIN (NITRODUR - DOSED IN MG/24 HR) 0.2 mg/hr patch Place 1 patch (0.2 mg total) onto the skin daily. Alternates patch placement with odd/even days. 30 patch 6  . pantoprazole (PROTONIX) 40 MG tablet Take 1 tablet (40 mg total) by mouth daily.    . pravastatin (PRAVACHOL) 40 MG tablet Take 40 mg by mouth daily.    .Marland KitchenSPIRIVA HANDIHALER 18 MCG inhalation capsule INHALE CONTENTS OF 1 CAPSULE VIA HANDIHALER ONCE DAILY (Patient not taking: No sig reported) 30 capsule 11  . SYMBICORT 160-4.5 MCG/ACT inhaler INHALE 2 PUFFS BY MOUTH TWICE DAILY (Patient  not taking: No sig reported) 10.2 g 5  . predniSONE (DELTASONE) 20 MG tablet 2 tabs po daily x 4 days 8 tablet 0   No facility-administered medications prior to visit.     PAST MEDICAL HISTORY: Past Medical History:  Diagnosis Date  . Achalasia   . AICD (automatic cardioverter/defibrillator) present 03/30/2011   Analyze ST study patient  . BPH (benign prostatic hyperplasia)   . Chronic systolic dysfunction of left ventricle    EF 30-35%, CLASS II - III SYMPTOMS; intolerant to Coreg  . CKD (chronic kidney disease) stage 3, GFR 30-59 ml/min ([name]) 06/08/2012  . COPD (chronic obstructive pulmonary disease) ([place])   . Coronary artery disease    History of remote anterior MI with PCI to LAD in 2006; most recent cath 2007, no intervention required  . DOE (dyspnea on exertion)    with heavy exertion  . Dyspnea   . Dysrhythmia   . Esophageal dysmotility   . GERD (gastroesophageal  reflux disease)   . Head injury, closed, with concussion 2000ish  . Heart murmur    hx  . HOH (hard of hearing)   . Hyperlipidemia   . Memory loss    improved  . Pneumonia "several times"   aspiration pna with at least 3 admits for this 2016.   Marland Kitchen PVC's (premature ventricular contractions)   . Renal failure   . Skin cancer "several"   "forearms; head"  . Type II diabetes mellitus ([place])     PAST SURGICAL HISTORY: Past Surgical History:  Procedure Laterality Date  . BALLOON DILATION N/A 09/09/2015   Procedure: BALLOON DILATION;  Surgeon: Mauri Pole, MD;  Location: [facility];  Service: Endoscopy;  Laterality: N/A;  rigiflex achalasia balloon dilators  . BOTOX INJECTION N/A 04/08/2015   Procedure: BOTOX INJECTION;  Surgeon: Milus Banister, MD;  Location: [facility];  Service: Endoscopy;  Laterality: N/A;  . CARDIAC CATHETERIZATION  01/11/2006   DEMONSTRATES AKINESIA OF THE DISTAL ANTERIOR WALL, DISTAL INFERIOR WALL AND AKINESIA OF THE APEX. THE BASAL SEGMENTS CONTRACT WELL AND OVERALL EF 35%  . CATARACT EXTRACTION W/ INTRAOCULAR LENS  IMPLANT, BILATERAL  08/2014-09/2014  . CHOLECYSTECTOMY  2/11  . CORONARY ANGIOPLASTY  1994   TO THE LAD  . ESOPHAGEAL MANOMETRY N/A 03/16/2015   Procedure: ESOPHAGEAL MANOMETRY (EM);  Surgeon: Jerene Bears, MD;  Location: [facility];  Service: Gastroenterology;  Laterality: N/A;  . ESOPHAGOGASTRODUODENOSCOPY N/A 04/08/2015   Procedure: ESOPHAGOGASTRODUODENOSCOPY (EGD);  Surgeon: Milus Banister, MD;  Location: [facility];  Service: Endoscopy;  Laterality: N/A;  . ESOPHAGOGASTRODUODENOSCOPY N/A 01/03/2018   Procedure: ESOPHAGOGASTRODUODENOSCOPY (EGD);  Surgeon: Mauri Pole, MD;  Location: [facility];  Service: Endoscopy;  Laterality: N/A;  . ESOPHAGOGASTRODUODENOSCOPY (EGD) WITH PROPOFOL N/A 09/09/2015   Procedure: ESOPHAGOGASTRODUODENOSCOPY (EGD) WITH PROPOFOL;  Surgeon: Mauri Pole, MD;  Location: [facility];  Service: Endoscopy;   Laterality: N/A;  pt. is to have gastrografin xray post recovery-do not discharge until results are back  . EYE SURGERY    . FOOT FRACTURE SURGERY Right 1980's  . INSERT / REPLACE / REMOVE PACEMAKER    . SAVORY DILATION N/A 01/03/2018   Procedure: RIGIFLEX DILATION;  Surgeon: Mauri Pole, MD;  Location: [facility];  Service: Endoscopy;  Laterality: N/A;  . SKIN CANCER EXCISION  "several"   "forearms, head"    FAMILY HISTORY: Family History  Problem Relation Age of Onset  . Stroke Father        Mother history  unknown - never met her  . Venous thrombosis Sister   . Colon cancer Neg Hx     SOCIAL HISTORY: Social History   Socioeconomic History  . Marital status: Widowed    Spouse name: Not on file  . Number of children: 2  . Years of education: GED  . Highest education level: Not on file  Occupational History  . Occupation: Clinical biochemist: [employer]    Comment: Retired  . Occupation: [employer]    Employer: [employer]    Comment: Retired  Scientific laboratory technician  . Financial resource strain: Patient refused  . Food insecurity:    Worry: Never true    Inability: Never true  . Transportation needs:    Medical: No    Non-medical: No  Tobacco Use  . Smoking status: Former Smoker    Packs/day: 1.00    Years: 35.00    Pack years: 35.00    Types: Cigarettes    Last attempt to quit: 08/01/1992    Years since quitting: 25.7  . Smokeless tobacco: Former Systems developer    Quit date: 1994  Substance and Sexual Activity  . Alcohol use: No    Alcohol/week: 0.0 standard drinks  . Drug use: No  . Sexual activity: Not Currently  Lifestyle  . Physical activity:    Days per week: Patient refused    Minutes per session: Patient refused  . Stress: Patient refused  Relationships  . Social connections:    Talks on phone: Not on file    Gets together: Not on file    Attends religious service: Not on file    Active member of club or organization: Not on file     Attends meetings of clubs or organizations: Not on file    Relationship status: Not on file  . Intimate partner violence:    Fear of current or ex partner: Not on file    Emotionally abused: Not on file    Physically abused: Not on file    Forced sexual activity: Not on file  Other Topics Concern  . Not on file  Social History Narrative   Pt lives in [place] alone.  Widowed.   Right-handed.   Rare caffeine use - maybe one cup per month.     PHYSICAL EXAM  Vitals:   05/03/18 1443  BP: 106/62  Pulse: 76  Weight: 165 lb (74.8 kg)  Height: _0  (1.727 m)   Body mass index is 25.09 kg/m.  Generalized: Well developed, in no acute distress  Head: normocephalic and atraumatic,. Oropharynx benign  Neck: Supple,  Musculoskeletal: No deformity  Skin, bruises noted on hand fragile skin Neurological examination   Mentation: Alert AFT 10 Unable to draw clock MMSE - Mini Mental State Exam 05/03/2018 05/01/2017 08/18/2015  Orientation to time _1 Orientation to Place _2 Registration _3 Attention/ Calculation _4 Recall 0 1 2  Language- name 2 objects _5 Language- repeat _6 Language- follow 3 step command _7 Language- read & follow direction _8 Write a sentence 0 1 1  Copy design 0 1 1  Total score _9 Follows all commands speech and language fluent.   Cranial nerve II-XII: Pupils were equal round reactive to light extraocular movements were full, visual field were full on confrontational test. Facial sensation and strength  were normal. HOH. Uvula tongue midline. head turning and shoulder shrug were normal and symmetric.Tongue protrusion into cheek strength was normal. Motor: normal bulk and tone, full strength in the BUE, BLE,  Sensory: normal and symmetric to light touch,  Coordination: finger-nose-finger,  no dysmetria Reflexes: Symmetric upper and lower, plantar responses were flexor bilaterally. Gait and Station: Rising up from seated  position without assistance, wide based  stance,  moderate stride, good arm swing, no difficulty with turns no assistive device  DIAGNOSTIC DATA (LABS, IMAGING, TESTING) - I reviewed patient records, labs, notes, testing and imaging myself where available.  Lab Results  Component Value Date   WBC 9.4 04/24/2018   HGB 11.0 (L) 04/24/2018   HCT 35.6 (L) 04/24/2018   MCV 90.8 04/24/2018   PLT 278 04/24/2018      Component Value Date/Time   NA 138 04/27/2018 1254   NA 142 04/16/2018 1451   K 4.1 04/27/2018 1254   CL 97 04/27/2018 1254   CO2 34 (H) 04/27/2018 1254   GLUCOSE 251 (H) 04/27/2018 1254   BUN 48 (H) 04/27/2018 1254   BUN 45 (H) 04/16/2018 1451   CREATININE 1.96 (H) 04/27/2018 1254   CREATININE 1.48 (H) 04/24/2015 1227   CALCIUM 9.5 04/27/2018 1254   PROT 7.1 04/27/2018 1254   ALBUMIN 3.6 04/27/2018 1254   AST 21 04/27/2018 1254   ALT 18 04/27/2018 1254   ALKPHOS 43 04/27/2018 1254   BILITOT 0.6 04/27/2018 1254   GFRNONAA 33 (L) 04/24/2018 0403   GFRAA 38 (L) 04/24/2018 0403   Lab Results  Component Value Date   CHOL 136 06/20/2013   HDL 32.00 (L) 06/20/2013   LDLCALC 76 06/20/2013   TRIG 142.0 06/20/2013   CHOLHDL 4 06/20/2013   Lab Results  Component Value Date   HGBA1C 6.6 (H) 01/02/2018    Lab Results  Component Value Date   TSH 0.635 01/02/2018      ASSESSMENT AND PLAN  84 y.o. year old with history of progressive memory trouble and multiple hospital admissions for COPD exacerbations, CHF, sepsis etc. since last seen.  Patient is currently on Aricept and Namenda.  He continues to live at home alone.   Keep Namenda 10 mg twice a day, Aricept 10 mg daily will refill Suggest some in home assistance [employer] Suggest life line  Have speech therapy send info on cognitive therapy in the home  No safety issues identified Follow up in 6 months I spent 45 minutes in total face to face time with the patient more than 50% of which was spent  counseling and coordination of care, reviewing test results , hospital records reviewing medications and discussing and reviewing the diagnosis of memory loss and next steps and further treatment options. ,             Rayburn Ma, [facility], APRN  [facility] [address] [place], [place] [address] [phone]

## 2018-05-03 ENCOUNTER — Encounter: Payer: Self-pay | Admitting: Nurse Practitioner

## 2018-05-03 ENCOUNTER — Ambulatory Visit (INDEPENDENT_AMBULATORY_CARE_PROVIDER_SITE_OTHER): Payer: Medicare Other | Admitting: Nurse Practitioner

## 2018-05-03 VITALS — BP 106/62 | HR 76 | Ht 68.0 in | Wt 165.0 lb

## 2018-05-03 DIAGNOSIS — I255 Ischemic cardiomyopathy: Secondary | ICD-10-CM | POA: Diagnosis not present

## 2018-05-03 DIAGNOSIS — R413 Other amnesia: Secondary | ICD-10-CM | POA: Diagnosis not present

## 2018-05-03 MED ORDER — MEMANTINE HCL 10 MG PO TABS: 10.0000 mg | ORAL_TABLET | Freq: Two times a day (BID) | ORAL | 1 refills | Status: AC

## 2018-05-03 MED ORDER — DONEPEZIL HCL 10 MG PO TABS: 10.0000 mg | ORAL_TABLET | Freq: Every day | ORAL | 1 refills | Status: AC

## 2018-05-03 NOTE — Patient Instructions (Signed)
Keep Namenda 10 mg twice a day, Aricept 10 mg daily will refill Suggest some in home assistance Suggest life line  Have speech therapy send info of cognitive therapy Follow up in 6 months

## 2018-05-04 ENCOUNTER — Telehealth: Payer: Self-pay | Admitting: Pulmonary Disease

## 2018-05-04 DIAGNOSIS — I5023 Acute on chronic systolic (congestive) heart failure: Secondary | ICD-10-CM | POA: Diagnosis not present

## 2018-05-04 DIAGNOSIS — I255 Ischemic cardiomyopathy: Secondary | ICD-10-CM | POA: Diagnosis not present

## 2018-05-04 DIAGNOSIS — J690 Pneumonitis due to inhalation of food and vomit: Secondary | ICD-10-CM | POA: Diagnosis not present

## 2018-05-04 DIAGNOSIS — E1122 Type 2 diabetes mellitus with diabetic chronic kidney disease: Secondary | ICD-10-CM | POA: Diagnosis not present

## 2018-05-04 DIAGNOSIS — J9621 Acute and chronic respiratory failure with hypoxia: Secondary | ICD-10-CM | POA: Diagnosis not present

## 2018-05-04 DIAGNOSIS — I2510 Atherosclerotic heart disease of native coronary artery without angina pectoris: Secondary | ICD-10-CM | POA: Diagnosis not present

## 2018-05-04 NOTE — Telephone Encounter (Signed)
Jim from [facility] called back and states he wonders if any recommendations for patient unable to cough up sputum sounds thick.  Little that he does get up is clear per Clair Gulling from [facility] his Physical Therapist.   Patient does have incentive spirometer and flutter valve.  Clair Gulling mentioned these two interventions did not help.    BW please advise

## 2018-05-04 NOTE — Telephone Encounter (Signed)
Called left message for Antonio Ray at [facility] to call back  Called patient to let him know he is to wear his oxygen at all times when moving around. Patient verbalized understand and had daughter get on phone to verify.

## 2018-05-04 NOTE — Telephone Encounter (Signed)
Antonio Ray returning call - he can be reached at [phone] -pr

## 2018-05-04 NOTE — Telephone Encounter (Signed)
Can you see if he has a nebulizer machine at home, if he does I can send in hypertonic saline nebulizer which he would use twice daily. If he does not advise mucinex 1200mg  twice daily with glass of water.

## 2018-05-04 NOTE — Telephone Encounter (Signed)
Note stated from pt's last OV with Derl Barrow, NP: - Desat to 87-88% RA when ambulating, recommend wearing oxygen at all times when ambulating until next follow-up    Called Jim with [facility] PT but unable to reach him. Left message for Clair Gulling to return call x1

## 2018-05-04 NOTE — Telephone Encounter (Signed)
Called spoke with patient, he wanted to call his son [name]. Called and spoke with [name].   I let him know about the 1200mg  of mucniex and to use rescue inhaler.    I stated that if he is still having problems on Monday to call our office back , and if symptoms worsen over the weekend to go to the nearest UC or ER.  [name] (son) verbalized understanding. Nothing further needed.

## 2018-05-07 ENCOUNTER — Telehealth: Payer: Self-pay | Admitting: Neurology

## 2018-05-07 DIAGNOSIS — L97512 Non-pressure chronic ulcer of other part of right foot with fat layer exposed: Secondary | ICD-10-CM | POA: Diagnosis not present

## 2018-05-07 NOTE — Telephone Encounter (Signed)
Spoke to [place] at [facility].  States the patient's family is interested in her coming to the home for cognitive education and safety recommendations.  Per vo by Dr. Krista Blue, okay for Ebony Hail to provide this service.  She will send over orders for MD to sign.

## 2018-05-07 NOTE — Telephone Encounter (Signed)
Allison/[facility] [phone] is wanting to speak with RN regarding last OV and possibly restarting speech therapy. Please call to advise

## 2018-05-08 DIAGNOSIS — E1122 Type 2 diabetes mellitus with diabetic chronic kidney disease: Secondary | ICD-10-CM | POA: Diagnosis not present

## 2018-05-08 DIAGNOSIS — I5023 Acute on chronic systolic (congestive) heart failure: Secondary | ICD-10-CM | POA: Diagnosis not present

## 2018-05-08 DIAGNOSIS — J9621 Acute and chronic respiratory failure with hypoxia: Secondary | ICD-10-CM | POA: Diagnosis not present

## 2018-05-08 DIAGNOSIS — I2510 Atherosclerotic heart disease of native coronary artery without angina pectoris: Secondary | ICD-10-CM | POA: Diagnosis not present

## 2018-05-08 DIAGNOSIS — J690 Pneumonitis due to inhalation of food and vomit: Secondary | ICD-10-CM | POA: Diagnosis not present

## 2018-05-08 DIAGNOSIS — I255 Ischemic cardiomyopathy: Secondary | ICD-10-CM | POA: Diagnosis not present

## 2018-05-09 DIAGNOSIS — J9621 Acute and chronic respiratory failure with hypoxia: Secondary | ICD-10-CM | POA: Diagnosis not present

## 2018-05-09 DIAGNOSIS — J690 Pneumonitis due to inhalation of food and vomit: Secondary | ICD-10-CM | POA: Diagnosis not present

## 2018-05-09 DIAGNOSIS — I2510 Atherosclerotic heart disease of native coronary artery without angina pectoris: Secondary | ICD-10-CM | POA: Diagnosis not present

## 2018-05-09 DIAGNOSIS — I5023 Acute on chronic systolic (congestive) heart failure: Secondary | ICD-10-CM | POA: Diagnosis not present

## 2018-05-09 DIAGNOSIS — I255 Ischemic cardiomyopathy: Secondary | ICD-10-CM | POA: Diagnosis not present

## 2018-05-09 DIAGNOSIS — E1122 Type 2 diabetes mellitus with diabetic chronic kidney disease: Secondary | ICD-10-CM | POA: Diagnosis not present

## 2018-05-09 NOTE — Progress Notes (Signed)
I have reviewed and agreed above plan. 

## 2018-05-11 DIAGNOSIS — I5023 Acute on chronic systolic (congestive) heart failure: Secondary | ICD-10-CM | POA: Diagnosis not present

## 2018-05-11 DIAGNOSIS — I255 Ischemic cardiomyopathy: Secondary | ICD-10-CM | POA: Diagnosis not present

## 2018-05-11 DIAGNOSIS — J9621 Acute and chronic respiratory failure with hypoxia: Secondary | ICD-10-CM | POA: Diagnosis not present

## 2018-05-11 DIAGNOSIS — E1122 Type 2 diabetes mellitus with diabetic chronic kidney disease: Secondary | ICD-10-CM | POA: Diagnosis not present

## 2018-05-11 DIAGNOSIS — I2510 Atherosclerotic heart disease of native coronary artery without angina pectoris: Secondary | ICD-10-CM | POA: Diagnosis not present

## 2018-05-11 DIAGNOSIS — J690 Pneumonitis due to inhalation of food and vomit: Secondary | ICD-10-CM | POA: Diagnosis not present

## 2018-05-13 NOTE — Progress Notes (Signed)
Antonio Ray Date of Birth: 09-23-1933  Medical Record #[id number]  History of Present Illness: Antonio Ray is seen for follow up CAD and CHF.  He is seen with his son. He has a history of coronary disease with remote myocardial infarction in 1994. He had emergent angioplasty of the LAD. Cardiac catheterization 2007 showed nonobstructive disease. He has an ischemic cardiomyopathy with ejection fraction of 30-35%. His last Myoview in 2011 showed an anterior wall scar without ischemia. He has an ICD in place.  Echo in September 2016 showed EF had dropped to 10-15% with global hypokinesis and akinesis of the anteroseptum.  He also has a history of recurrent aspiration PNA.   He had a barium swallow that showed achalasia. Apparently manography confirmed this. He has had esophageal dilation. He has a history of memory loss and is now on Aricept and Namenda. He was admitted 9/21-9/22/17 with acute COPD exacerbation treated with Nebs, steroids, and antibiotics. Echo showed EF 20-25%. No acute CHF. He was admitted in February 2018 with COPD  exacerbation and April 2018 with recurrent aspiration pneumonia. Followed by pulmonary and GI. Son reports swallowing evaluation was OK.  He was admitted in July 2018 with altered mental status and UTI. CT head was negative. Symptoms resolved with antibiotics. In November he was admitted with fever, altered mental status and atypical PNA. Last ICD check in October 2018 was normal.   Admitted 09/05-09/04/2018 for ?asp PNA, more likely CHF. BNP level over 1500. Normal WBC. DC on Lasix 40 mg qd. Wt decreased to 165 after d/c. Lasix decreased to 20 mg qd.   Last ICD check 02/26/18 was normal.   On follow up today he is seen with his son. Weight at home has been stable at 165. Swelling R>L stable. Breathing has improved and he is off oxygen. Minimal cough. He has noted more memory loss but is still quite functional.   Current Outpatient Medications on File Prior to  Visit  Medication Sig Dispense Refill  . albuterol (PROVENTIL HFA;VENTOLIN HFA) 108 (90 Base) MCG/ACT inhaler Inhale 2 puffs into the lungs every 6 (six) hours as needed for wheezing or shortness of breath. 1 Inhaler 2  . aspirin 81 MG EC tablet Take 81 mg by mouth daily.      . Cholecalciferol (VITAMIN D) 2000 units tablet Take 2,000 Units by mouth daily.     Marland Kitchen donepezil (ARICEPT) 10 MG tablet Take 1 tablet (10 mg total) by mouth daily. 90 tablet 1  . fenofibrate (TRICOR) 145 MG tablet Take 145 mg by mouth once a day 90 tablet 0  . finasteride (PROSCAR) 5 MG tablet Take 5 mg by mouth daily.  0  . Fluticasone-Umeclidin-Vilant (TRELEGY ELLIPTA IN) Inhale into the lungs.    . furosemide (LASIX) 20 MG tablet Take 1 tablet (20 mg total) by mouth daily. Take an additional 66m if your weight is up 3lbs in a day 5lbs in a week 35 tablet 0  . glucose blood (FREESTYLE TEST STRIPS) test strip Use as instructed 3x a day 200 each 3  . insulin aspart (NOVOLOG) 100 UNIT/ML injection Inject 4-10 Units into the skin 3 (three) times daily before meals. Inject up to 6 units 3x a day before meals - pens please 15 mL 5  . Insulin Glargine (BASAGLAR KWIKPEN) 100 UNIT/ML SOPN INJECT  6 UNITS UNDER THE SKIN EVERY MORNING (Patient taking differently: Inject 6 Units into the skin daily. ) 15 mL 0  . Insulin Pen  Needle 32G X 4 MM MISC Use 1x a day 200 each 3  . loratadine (CLARITIN) 10 MG tablet Take 1 tablet (10 mg total) by mouth daily. 30 tablet 1  . memantine (NAMENDA) 10 MG tablet Take 1 tablet (10 mg total) by mouth 2 (two) times daily. 180 tablet 1  . Multiple Vitamins-Minerals (PRESERVISION AREDS 2) CAPS Take 1 capsule by mouth 2 (two) times daily.    . nitroGLYCERIN (NITRODUR - DOSED IN MG/24 HR) 0.2 mg/hr patch Place 1 patch (0.2 mg total) onto the skin daily. Alternates patch placement with odd/even days. 30 patch 6  . pantoprazole (PROTONIX) 40 MG tablet Take 1 tablet (40 mg total) by mouth daily.    .  pravastatin (PRAVACHOL) 40 MG tablet Take 40 mg by mouth daily.    Marland Kitchen SPIRIVA HANDIHALER 18 MCG inhalation capsule INHALE CONTENTS OF 1 CAPSULE VIA HANDIHALER ONCE DAILY 30 capsule 11  . SYMBICORT 160-4.5 MCG/ACT inhaler INHALE 2 PUFFS BY MOUTH TWICE DAILY 10.2 g 5   No current facility-administered medications on file prior to visit.     Allergies  Allergen Reactions  . Codeine Other (See Comments)    Gets very angry, disoriented  . Dilaudid [Hydromorphone Hcl] Other (See Comments)    VERY AGITATED, HOSTILE  . Flomax [Tamsulosin Hcl] Shortness Of Breath  . Morphine And Related Other (See Comments)    VERY AGITATED, HOSTILE  . Sulfa Antibiotics Shortness Of Breath  . Beta Adrenergic Blockers Other (See Comments)    Disorientation  . Carvedilol Other (See Comments)    DISORIENTATION    Past Medical History:  Diagnosis Date  . Achalasia   . AICD (automatic cardioverter/defibrillator) present 03/30/2011   Analyze ST study patient  . BPH (benign prostatic hyperplasia)   . Chronic systolic dysfunction of left ventricle    EF 30-35%, CLASS II - III SYMPTOMS; intolerant to Coreg  . CKD (chronic kidney disease) stage 3, GFR 30-59 ml/min ([name]) 06/08/2012  . COPD (chronic obstructive pulmonary disease) ([place])   . Coronary artery disease    History of remote anterior MI with PCI to LAD in 2006; most recent cath 2007, no intervention required  . DOE (dyspnea on exertion)    with heavy exertion  . Dyspnea   . Dysrhythmia   . Esophageal dysmotility   . GERD (gastroesophageal reflux disease)   . Head injury, closed, with concussion 2000ish  . Heart murmur    hx  . HOH (hard of hearing)   . Hyperlipidemia   . Memory loss    improved  . Pneumonia "several times"   aspiration pna with at least 3 admits for this 2016.   Marland Kitchen PVC's (premature ventricular contractions)   . Renal failure   . Skin cancer "several"   "forearms; head"  . Type II diabetes mellitus ([place])     Past Surgical  History:  Procedure Laterality Date  . BALLOON DILATION N/A 09/09/2015   Procedure: BALLOON DILATION;  Surgeon: Mauri Pole, MD;  Location: [facility];  Service: Endoscopy;  Laterality: N/A;  rigiflex achalasia balloon dilators  . BOTOX INJECTION N/A 04/08/2015   Procedure: BOTOX INJECTION;  Surgeon: Milus Banister, MD;  Location: [facility];  Service: Endoscopy;  Laterality: N/A;  . CARDIAC CATHETERIZATION  01/11/2006   DEMONSTRATES AKINESIA OF THE DISTAL ANTERIOR WALL, DISTAL INFERIOR WALL AND AKINESIA OF THE APEX. THE BASAL SEGMENTS CONTRACT WELL AND OVERALL EF 35%  . CATARACT EXTRACTION W/ INTRAOCULAR LENS  IMPLANT, BILATERAL  08/2014-09/2014  . CHOLECYSTECTOMY  2/11  . CORONARY ANGIOPLASTY  1994   TO THE LAD  . ESOPHAGEAL MANOMETRY N/A 03/16/2015   Procedure: ESOPHAGEAL MANOMETRY (EM);  Surgeon: Jerene Bears, MD;  Location: [facility];  Service: Gastroenterology;  Laterality: N/A;  . ESOPHAGOGASTRODUODENOSCOPY N/A 04/08/2015   Procedure: ESOPHAGOGASTRODUODENOSCOPY (EGD);  Surgeon: Milus Banister, MD;  Location: [facility];  Service: Endoscopy;  Laterality: N/A;  . ESOPHAGOGASTRODUODENOSCOPY N/A 01/03/2018   Procedure: ESOPHAGOGASTRODUODENOSCOPY (EGD);  Surgeon: Mauri Pole, MD;  [facility];  Service: Endoscopy;  Laterality: N/A;  . ESOPHAGOGASTRODUODENOSCOPY (EGD) WITH PROPOFOL N/A 09/09/2015   Procedure: ESOPHAGOGASTRODUODENOSCOPY (EGD) WITH PROPOFOL;  Surgeon: Mauri Pole, MD;  Location: [facility];  Service: Endoscopy;  Laterality: N/A;  pt. is to have gastrografin xray post recovery-do not discharge until results are back  . EYE SURGERY    . FOOT FRACTURE SURGERY Right 1980's  . INSERT / REPLACE / REMOVE PACEMAKER    . SAVORY DILATION N/A 01/03/2018   Procedure: RIGIFLEX DILATION;  Surgeon: Mauri Pole, MD;  Location: [facility];  Service: Endoscopy;  Laterality: N/A;  . SKIN CANCER EXCISION  "several"   "forearms, head"    Social History    Tobacco Use  Smoking Status Former Smoker  . Packs/day: 1.00  . Years: 35.00  . Pack years: 35.00  . Types: Cigarettes  . Last attempt to quit: 08/01/1992  . Years since quitting: 25.8  Smokeless Tobacco Former Systems developer  . Quit date: 79    Social History   Substance and Sexual Activity  Alcohol Use No  . Alcohol/week: 0.0 standard drinks    Family History  Problem Relation Age of Onset  . Stroke Father        Mother history unknown - never met her  . Venous thrombosis Sister   . Colon cancer Neg Hx     Review of Systems: As noted in history of present illness. All other systems were reviewed and are negative.  Physical Exam: BP 114/62 (BP Location: Left Arm, Patient Position: Sitting, Cuff Size: Normal)   Pulse 84   Ht 5' 8"  (1.727 m)   Wt 171 lb 3.2 oz (77.7 kg)   BMI 26.03 kg/m  GENERAL:  Well appearing elderly WM in NAD HEENT:  PERRL, EOMI, sclera are clear. Oropharynx is clear. NECK:  No jugular venous distention, carotid upstroke brisk and symmetric, no bruits, no thyromegaly or adenopathy LUNGS:  Clear to auscultation bilaterally CHEST:  Unremarkable HEART:  RRR,  PMI not displaced or sustained,S1 and S2 within normal limits, no S3, no S4: no clicks, no rubs, no murmurs ABD:  Soft, nontender. BS +, no masses or bruits. No hepatomegaly, no splenomegaly EXT:  2 + pulses throughout, 1-2+ RLE edema, trace on left, no cyanosis no clubbing SKIN:  Warm and dry.  No rashes NEURO:  Alert and oriented x 3. Cranial nerves II through XII intact. PSYCH:  Cognitively intact    LABORATORY DATA: Lab Results  Component Value Date   WBC 9.4 04/24/2018   HGB 11.0 (L) 04/24/2018   HCT 35.6 (L) 04/24/2018   PLT 278 04/24/2018   GLUCOSE 251 (H) 04/27/2018   CHOL 136 06/20/2013   TRIG 142.0 06/20/2013   HDL 32.00 (L) 06/20/2013   LDLCALC 76 06/20/2013   ALT 18 04/27/2018   AST 21 04/27/2018   NA 138 04/27/2018   K 4.1 04/27/2018   CL 97 04/27/2018   CREATININE 1.96  (H) 04/27/2018  BUN 48 (H) 04/27/2018   CO2 34 (H) 04/27/2018   TSH 0.635 01/02/2018   INR 1.23 01/01/2018   HGBA1C 6.6 (H) 01/02/2018   Labs dated 02/05/16: cholesterol 144, triglycerides 223, HDL 40, LDL 59. Dated 11/01/17: cholesterol 126, triglycerides 94, HDL 50, LDL 57.   Echo: 04/22/16: Study Conclusions  - Left ventricle: The cavity size was normal. There was moderate   concentric hypertrophy. Systolic function was severely reduced.   The estimated ejection fraction was in the range of 20% to 25%.   Wall motion was normal; there were no regional wall motion   abnormalities. Doppler parameters are consistent with abnormal   left ventricular relaxation (grade 1 diastolic dysfunction).   Doppler parameters are consistent with elevated ventricular   end-diastolic filling pressure. - Aortic valve: There was trivial regurgitation. - Aortic root: The aortic root was normal in size. - Mitral valve: Structurally normal valve. There was mild   regurgitation. - Left atrium: The atrium was mildly dilated. - Right ventricle: Pacer wire or catheter noted in right ventricle.   Systolic function was normal. - Right atrium: Pacer wire or catheter noted in right atrium. - Tricuspid valve: There was trivial regurgitation. - Pulmonic valve: There was no regurgitation. - Pulmonary arteries: Systolic pressure was within the normal   range. - Inferior vena cava: The vessel was normal in size. - Pericardium, extracardiac: There was no pericardial effusion.  Impressions:  - There is akinesis of the basal and mid anterior, anteroseptal,   inferoseptal walls, mid inferior wall and dyskinesis of the   apical anterior, septal and inferior walls including the true   septum. LVEF has slightly improved from the study on 04/02/2015,   now 20-25%, previously 10-15%.   Echo 10/15/17: Study Conclusions  - Left ventricle: The cavity size was moderately dilated. Wall   thickness was normal. Systolic  function was severely reduced. The   estimated ejection fraction was in the range of 20% to 25%.   Akinesis of the anteroseptal, anterior, and apical myocardium;   consistent with infarction in the distribution of the left   anterior descending coronary artery. Akinesis and scarring of the   mid-apicalinferior and inferoseptal myocardium; consistent with   infarction in the distribution of the right coronary artery.   Features are consistent with a pseudonormal left ventricular   filling pattern, with concomitant abnormal relaxation and   increased filling pressure (grade 2 diastolic dysfunction).   Acoustic contrast opacification revealed no evidence ofthrombus. - Aortic valve: There was trivial regurgitation. - Mitral valve: There was mild to moderate regurgitation directed   centrally. - Left atrium: The atrium was moderately dilated. - Right atrium: The atrium was mildly dilated.  Assessment / Plan: 1. Coronary disease with remote anterior myocardial infarction in 1994. Cardiac catheterization 2007 showed nonobstructive disease. His last stress test in October 2011 showed no ischemia. He is having no angina currently.  We will continue with his current medical therapy including aspirin. He has a history of intolerance to carvedilol  because of dizziness. On nitrodur patch.  2. Ischemic cardiomyopathy with chronic systolic CHF. Last EF 20-25%. He is class 2. He has mild edema but weight at home stable.  We will continue lasix to 20 mg daily but increase to 40 mg if weight gain > 3 lbs or 40 mg bid for weight > 5 lbs.  Limit sodiume intake.  Not a candidate for ACEi/ARB due to CKD.   3. Prophylactic ICD. Stable   4.  Hyperlipidemia. Excellent control.  5. Chronic kidney disease stage III. Last creatinine 1.96.   6. Recurrent PNA- possibly related to aspiration. Followed by pulmonary  7. Achalasia. Per GI. S/p esophageal dilation.  8. COPD  Follow up in 3 months.

## 2018-05-14 ENCOUNTER — Ambulatory Visit (INDEPENDENT_AMBULATORY_CARE_PROVIDER_SITE_OTHER): Payer: Medicare Other | Admitting: Cardiology

## 2018-05-14 ENCOUNTER — Encounter: Payer: Self-pay | Admitting: Cardiology

## 2018-05-14 VITALS — BP 114/62 | HR 84 | Ht 68.0 in | Wt 171.2 lb

## 2018-05-14 DIAGNOSIS — Z95810 Presence of automatic (implantable) cardiac defibrillator: Secondary | ICD-10-CM

## 2018-05-14 DIAGNOSIS — I255 Ischemic cardiomyopathy: Secondary | ICD-10-CM | POA: Diagnosis not present

## 2018-05-14 DIAGNOSIS — I5042 Chronic combined systolic (congestive) and diastolic (congestive) heart failure: Secondary | ICD-10-CM

## 2018-05-14 DIAGNOSIS — N183 Chronic kidney disease, stage 3 unspecified: Secondary | ICD-10-CM

## 2018-05-14 DIAGNOSIS — I2510 Atherosclerotic heart disease of native coronary artery without angina pectoris: Secondary | ICD-10-CM

## 2018-05-14 NOTE — Patient Instructions (Signed)
Continue your current therapy  Monitor weight at home and adjust lasix as needed.

## 2018-05-15 DIAGNOSIS — I255 Ischemic cardiomyopathy: Secondary | ICD-10-CM | POA: Diagnosis not present

## 2018-05-15 DIAGNOSIS — J9621 Acute and chronic respiratory failure with hypoxia: Secondary | ICD-10-CM | POA: Diagnosis not present

## 2018-05-15 DIAGNOSIS — E1122 Type 2 diabetes mellitus with diabetic chronic kidney disease: Secondary | ICD-10-CM | POA: Diagnosis not present

## 2018-05-15 DIAGNOSIS — I2510 Atherosclerotic heart disease of native coronary artery without angina pectoris: Secondary | ICD-10-CM | POA: Diagnosis not present

## 2018-05-15 DIAGNOSIS — I5023 Acute on chronic systolic (congestive) heart failure: Secondary | ICD-10-CM | POA: Diagnosis not present

## 2018-05-15 DIAGNOSIS — J690 Pneumonitis due to inhalation of food and vomit: Secondary | ICD-10-CM | POA: Diagnosis not present

## 2018-05-16 DIAGNOSIS — I2510 Atherosclerotic heart disease of native coronary artery without angina pectoris: Secondary | ICD-10-CM | POA: Diagnosis not present

## 2018-05-16 DIAGNOSIS — J690 Pneumonitis due to inhalation of food and vomit: Secondary | ICD-10-CM | POA: Diagnosis not present

## 2018-05-16 DIAGNOSIS — E1122 Type 2 diabetes mellitus with diabetic chronic kidney disease: Secondary | ICD-10-CM | POA: Diagnosis not present

## 2018-05-16 DIAGNOSIS — I255 Ischemic cardiomyopathy: Secondary | ICD-10-CM | POA: Diagnosis not present

## 2018-05-16 DIAGNOSIS — I5023 Acute on chronic systolic (congestive) heart failure: Secondary | ICD-10-CM | POA: Diagnosis not present

## 2018-05-16 DIAGNOSIS — J9621 Acute and chronic respiratory failure with hypoxia: Secondary | ICD-10-CM | POA: Diagnosis not present

## 2018-05-17 DIAGNOSIS — I255 Ischemic cardiomyopathy: Secondary | ICD-10-CM | POA: Diagnosis not present

## 2018-05-17 DIAGNOSIS — J690 Pneumonitis due to inhalation of food and vomit: Secondary | ICD-10-CM | POA: Diagnosis not present

## 2018-05-17 DIAGNOSIS — E1122 Type 2 diabetes mellitus with diabetic chronic kidney disease: Secondary | ICD-10-CM | POA: Diagnosis not present

## 2018-05-17 DIAGNOSIS — I2510 Atherosclerotic heart disease of native coronary artery without angina pectoris: Secondary | ICD-10-CM | POA: Diagnosis not present

## 2018-05-17 DIAGNOSIS — I5023 Acute on chronic systolic (congestive) heart failure: Secondary | ICD-10-CM | POA: Diagnosis not present

## 2018-05-17 DIAGNOSIS — J9621 Acute and chronic respiratory failure with hypoxia: Secondary | ICD-10-CM | POA: Diagnosis not present

## 2018-05-18 ENCOUNTER — Ambulatory Visit (INDEPENDENT_AMBULATORY_CARE_PROVIDER_SITE_OTHER): Payer: Medicare Other | Admitting: Pulmonary Disease

## 2018-05-18 ENCOUNTER — Encounter: Payer: Self-pay | Admitting: Pulmonary Disease

## 2018-05-18 VITALS — BP 112/60 | HR 78 | Ht 67.64 in | Wt 170.0 lb

## 2018-05-18 DIAGNOSIS — I5023 Acute on chronic systolic (congestive) heart failure: Secondary | ICD-10-CM | POA: Diagnosis not present

## 2018-05-18 DIAGNOSIS — J9611 Chronic respiratory failure with hypoxia: Secondary | ICD-10-CM

## 2018-05-18 DIAGNOSIS — I5042 Chronic combined systolic (congestive) and diastolic (congestive) heart failure: Secondary | ICD-10-CM

## 2018-05-18 DIAGNOSIS — E1122 Type 2 diabetes mellitus with diabetic chronic kidney disease: Secondary | ICD-10-CM | POA: Diagnosis not present

## 2018-05-18 DIAGNOSIS — J9621 Acute and chronic respiratory failure with hypoxia: Secondary | ICD-10-CM | POA: Diagnosis not present

## 2018-05-18 DIAGNOSIS — R05 Cough: Secondary | ICD-10-CM

## 2018-05-18 DIAGNOSIS — J690 Pneumonitis due to inhalation of food and vomit: Secondary | ICD-10-CM | POA: Diagnosis not present

## 2018-05-18 DIAGNOSIS — I2510 Atherosclerotic heart disease of native coronary artery without angina pectoris: Secondary | ICD-10-CM | POA: Diagnosis not present

## 2018-05-18 DIAGNOSIS — J449 Chronic obstructive pulmonary disease, unspecified: Secondary | ICD-10-CM | POA: Diagnosis not present

## 2018-05-18 DIAGNOSIS — I255 Ischemic cardiomyopathy: Secondary | ICD-10-CM

## 2018-05-18 DIAGNOSIS — R059 Cough, unspecified: Secondary | ICD-10-CM

## 2018-05-18 MED ORDER — FLUTICASONE-UMECLIDIN-VILANT 100-62.5-25 MCG/INH IN AEPB: 1.0000 | INHALATION_SPRAY | Freq: Every day | RESPIRATORY_TRACT | 0 refills | Status: AC

## 2018-05-18 MED ORDER — FLUTICASONE-UMECLIDIN-VILANT 100-62.5-25 MCG/INH IN AEPB: 1.0000 | INHALATION_SPRAY | Freq: Every day | RESPIRATORY_TRACT | 5 refills | Status: DC

## 2018-05-18 NOTE — Progress Notes (Signed)
Subjective:    Patient ID: Antonio Ray, male    DOB: 18-Aug-1933, 84 y.o.   MRN: [id number]  Synopsis: First the [place] pulmonary clinic in 2015 for severe COPD. His FEV1 at that time was 29% predicted. He also has a scar in his right lung base believed to be related to pneumonia. He also has a history of achalasia and was admitted for healthcare associated pneumonia multiple times in 2016. He underwent Botox injections in his distal esophagus in late 2016.  He was hospitalized many times in 2019 in the setting of recurrent aspiration.  He had to have his esophagus dilated again in 2019 (June).  He also had a heart failure exacerbation and a COPD exacerbation in the setting of a code red day.  HPI  Chief Complaint  Patient presents with  . Follow-up    reports "breathing is okay"   Ruthann Cancer reports that he has been doing fine since the last visit.  He had a little bit of increased chest congestion a week ago and was p instructed to take Mucinex which helped.  He says that this decreased after taking it.  He stopped taking the Mucinex a few days ago. He has been doing fairly well with the addition of Trelegy.  He has not had any problems with it so far he is rinsing his mouth.  His shortness of breath is about at baseline.  He continues to struggle with confusion sometimes in the morning, his family is very attentive to this.  Past Medical History:  Diagnosis Date  . Achalasia   . AICD (automatic cardioverter/defibrillator) present 03/30/2011   Analyze ST study patient  . BPH (benign prostatic hyperplasia)   . Chronic systolic dysfunction of left ventricle    EF 30-35%, CLASS II - III SYMPTOMS; intolerant to Coreg  . CKD (chronic kidney disease) stage 3, GFR 30-59 ml/min ([name]) 06/08/2012  . COPD (chronic obstructive pulmonary disease) ([place])   . Coronary artery disease    History of remote anterior MI with PCI to LAD in 2006; most recent cath 2007, no intervention required  . DOE  (dyspnea on exertion)    with heavy exertion  . Dyspnea   . Dysrhythmia   . Esophageal dysmotility   . GERD (gastroesophageal reflux disease)   . Head injury, closed, with concussion 2000ish  . Heart murmur    hx  . HOH (hard of hearing)   . Hyperlipidemia   . Memory loss    improved  . Pneumonia "several times"   aspiration pna with at least 3 admits for this 2016.   Marland Kitchen PVC's (premature ventricular contractions)   . Renal failure   . Skin cancer "several"   "forearms; head"  . Type II diabetes mellitus ([place])      Review of Systems  Constitutional: Negative for chills, fatigue and fever.  HENT: Negative for postnasal drip, rhinorrhea and sinus pressure.   Respiratory: Positive for cough. Negative for chest tightness, shortness of breath and wheezing.   Cardiovascular: Negative for chest pain, palpitations and leg swelling.       Objective:   Physical Exam  Vitals:   05/18/18 1121  BP: 112/60  Pulse: 78  SpO2: 96%  Weight: 170 lb (77.1 kg)  Height: 5' 7.64" (1.718 m)  RA  Gen: chronically ill appearing HENT: OP clear, TM's clear, neck supple PULM: Crackles R base B, normal percussion CV: RRR, slight systolic murmur, trace edema GI: BS+, soft, nontender Derm: no cyanosis  or rash Psyche: normal mood and affect   Echocardiogram: March 2019 LVEF 20-25%  Barium swallow: March 2019 showed high-grade stenosis at the GE junction  January 2019 hospital discharge records reviewed.  He was hospitalized for pneumonia and a COPD exacerbation in the setting of a diagnosis of influenza A.  He was treated with prednisone, Ceftin, and Tamiflu.  Chest imaging: January 2019 chest x-ray images independently reviewed showing nodular opacities in the right lung. CT chest from March 2019 independently reviewed showing tree-in-bud abnormalities throughout the right lung and some in the left base with a massively dilated esophagus with air-fluid level CT chest from September 2019  reviewed showing bilateral pleural effusions, patchy consolidation  June 2019 sputum culture positive for MAI   Records from his last visit here with our nurse practitioner reviewed where he was changed to Trelegy and thought was given to treating with azithromycin on a daily basis to prevent COPD exacerbations.       Assessment & Plan:  No diagnosis found.  Discussion: This has been a stable interval for [facility].  He has not had an exacerbation since the last visit.  He has had many exacerbations, heart failure exacerbations, episodes of aspiration pneumonia requiring hospitalization this year.  I think that changing him to Trelegy has been a very good thing because it is much simpler for him.  It is not clear to me that azithromycin would be helpful however because I think most of his exacerbations were explained by his recurrent aspiration, poor air quality, and heart failure.  That being said, that is a reasonable consideration in the absence of an MAI infection.  In regards to his MAI I believe he is colonized and not infected.  I would like to get another culture today.  Plan: Severe COPD: Continue Trelegy 1 puff daily I am glad you have had a flu shot Continue albuterol as needed for chest tightness wheezing or shortness of breath Use Mucinex 1-2 times a day as needed for chest congestion  Sputum culture positive for MAI: We will collect another sample today I believe at this time you were just colonized, not infected  Recurrent aspiration pneumonia: I am glad this has improved since having your esophagus dilated earlier this year  Congestive heart failure: Continue diuretics as directed by the heart failure/cardiology clinic  Follow-up with me in 8 weeks  > 50% of this 25 minute visit spent face to face   Current Outpatient Medications:  .  albuterol (PROVENTIL HFA;VENTOLIN HFA) 108 (90 Base) MCG/ACT inhaler, Inhale 2 puffs into the lungs every 6 (six) hours as  needed for wheezing or shortness of breath., Disp: 1 Inhaler, Rfl: 2 .  aspirin 81 MG EC tablet, Take 81 mg by mouth daily.  , Disp: , Rfl:  .  Cholecalciferol (VITAMIN D) 2000 units tablet, Take 2,000 Units by mouth daily. , Disp: , Rfl:  .  donepezil (ARICEPT) 10 MG tablet, Take 1 tablet (10 mg total) by mouth daily., Disp: 90 tablet, Rfl: 1 .  fenofibrate (TRICOR) 145 MG tablet, Take 145 mg by mouth once a day, Disp: 90 tablet, Rfl: 0 .  finasteride (PROSCAR) 5 MG tablet, Take 5 mg by mouth daily., Disp: , Rfl: 0 .  Fluticasone-Umeclidin-Vilant (TRELEGY ELLIPTA IN), Inhale into the lungs., Disp: , Rfl:  .  furosemide (LASIX) 20 MG tablet, Take 1 tablet (20 mg total) by mouth daily. Take an additional 20mg  if your weight is up 3lbs in a day 5lbs  in a week, Disp: 35 tablet, Rfl: 0 .  glucose blood (FREESTYLE TEST STRIPS) test strip, Use as instructed 3x a day, Disp: 200 each, Rfl: 3 .  insulin aspart (NOVOLOG) 100 UNIT/ML injection, Inject 4-10 Units into the skin 3 (three) times daily before meals. Inject up to 6 units 3x a day before meals - pens please, Disp: 15 mL, Rfl: 5 .  Insulin Glargine (BASAGLAR KWIKPEN) 100 UNIT/ML SOPN, INJECT  6 UNITS UNDER THE SKIN EVERY MORNING (Patient taking differently: Inject 6 Units into the skin daily. ), Disp: 15 mL, Rfl: 0 .  Insulin Pen Needle 32G X 4 MM MISC, Use 1x a day, Disp: 200 each, Rfl: 3 .  loratadine (CLARITIN) 10 MG tablet, Take 1 tablet (10 mg total) by mouth daily., Disp: 30 tablet, Rfl: 1 .  memantine (NAMENDA) 10 MG tablet, Take 1 tablet (10 mg total) by mouth 2 (two) times daily., Disp: 180 tablet, Rfl: 1 .  Multiple Vitamins-Minerals (PRESERVISION AREDS 2) CAPS, Take 1 capsule by mouth 2 (two) times daily., Disp: , Rfl:  .  nitroGLYCERIN (NITRODUR - DOSED IN MG/24 HR) 0.2 mg/hr patch, Place 1 patch (0.2 mg total) onto the skin daily. Alternates patch placement with odd/even days., Disp: 30 patch, Rfl: 6 .  pantoprazole (PROTONIX) 40 MG  tablet, Take 1 tablet (40 mg total) by mouth daily., Disp: , Rfl:  .  pravastatin (PRAVACHOL) 40 MG tablet, Take 40 mg by mouth daily., Disp: , Rfl:  .  SPIRIVA HANDIHALER 18 MCG inhalation capsule, INHALE CONTENTS OF 1 CAPSULE VIA HANDIHALER ONCE DAILY (Patient not taking: Reported on 05/18/2018), Disp: 30 capsule, Rfl: 11 .  SYMBICORT 160-4.5 MCG/ACT inhaler, INHALE 2 PUFFS BY MOUTH TWICE DAILY (Patient not taking: Reported on 05/18/2018), Disp: 10.2 g, Rfl: 5

## 2018-05-18 NOTE — Patient Instructions (Signed)
Severe COPD: Continue Trelegy 1 puff daily I am glad you have had a flu shot Continue albuterol as needed for chest tightness wheezing or shortness of breath Use Mucinex 1-2 times a day as needed for chest congestion  Sputum culture positive for MAI: We will collect another sample today I believe at this time you were just colonized, not infected  Recurrent aspiration pneumonia: I am glad this has improved since having your esophagus dilated earlier this year  Congestive heart failure: Continue diuretics as directed by the heart failure/cardiology clinic  Follow-up with me in 8 weeks

## 2018-05-22 ENCOUNTER — Other Ambulatory Visit: Payer: Medicare Other

## 2018-05-22 DIAGNOSIS — R05 Cough: Secondary | ICD-10-CM

## 2018-05-22 DIAGNOSIS — R059 Cough, unspecified: Secondary | ICD-10-CM

## 2018-05-22 DIAGNOSIS — I255 Ischemic cardiomyopathy: Secondary | ICD-10-CM | POA: Diagnosis not present

## 2018-05-22 DIAGNOSIS — E1122 Type 2 diabetes mellitus with diabetic chronic kidney disease: Secondary | ICD-10-CM | POA: Diagnosis not present

## 2018-05-22 DIAGNOSIS — I2510 Atherosclerotic heart disease of native coronary artery without angina pectoris: Secondary | ICD-10-CM | POA: Diagnosis not present

## 2018-05-22 DIAGNOSIS — J9621 Acute and chronic respiratory failure with hypoxia: Secondary | ICD-10-CM | POA: Diagnosis not present

## 2018-05-22 DIAGNOSIS — I5023 Acute on chronic systolic (congestive) heart failure: Secondary | ICD-10-CM | POA: Diagnosis not present

## 2018-05-22 DIAGNOSIS — J690 Pneumonitis due to inhalation of food and vomit: Secondary | ICD-10-CM | POA: Diagnosis not present

## 2018-05-22 LAB — MYCOBACTERIA,CULT W/FLUOROCHROME SMEAR
MICRO NUMBER:: 91029865
SMEAR:: NONE SEEN
SPECIMEN QUALITY:: ADEQUATE

## 2018-05-24 DIAGNOSIS — I5023 Acute on chronic systolic (congestive) heart failure: Secondary | ICD-10-CM | POA: Diagnosis not present

## 2018-05-24 DIAGNOSIS — J9621 Acute and chronic respiratory failure with hypoxia: Secondary | ICD-10-CM | POA: Diagnosis not present

## 2018-05-24 DIAGNOSIS — E1122 Type 2 diabetes mellitus with diabetic chronic kidney disease: Secondary | ICD-10-CM | POA: Diagnosis not present

## 2018-05-24 DIAGNOSIS — I255 Ischemic cardiomyopathy: Secondary | ICD-10-CM | POA: Diagnosis not present

## 2018-05-24 DIAGNOSIS — J690 Pneumonitis due to inhalation of food and vomit: Secondary | ICD-10-CM | POA: Diagnosis not present

## 2018-05-24 DIAGNOSIS — I2510 Atherosclerotic heart disease of native coronary artery without angina pectoris: Secondary | ICD-10-CM | POA: Diagnosis not present

## 2018-05-25 LAB — RESPIRATORY CULTURE OR RESPIRATORY AND SPUTUM CULTURE
MICRO NUMBER:: 91268857
RESULT: NORMAL
SPECIMEN QUALITY: ADEQUATE

## 2018-05-28 ENCOUNTER — Ambulatory Visit (INDEPENDENT_AMBULATORY_CARE_PROVIDER_SITE_OTHER): Payer: Medicare Other | Admitting: *Deleted

## 2018-05-28 DIAGNOSIS — I255 Ischemic cardiomyopathy: Secondary | ICD-10-CM

## 2018-05-28 DIAGNOSIS — J690 Pneumonitis due to inhalation of food and vomit: Secondary | ICD-10-CM | POA: Diagnosis not present

## 2018-05-28 DIAGNOSIS — I5023 Acute on chronic systolic (congestive) heart failure: Secondary | ICD-10-CM | POA: Diagnosis not present

## 2018-05-28 DIAGNOSIS — I2510 Atherosclerotic heart disease of native coronary artery without angina pectoris: Secondary | ICD-10-CM | POA: Diagnosis not present

## 2018-05-28 DIAGNOSIS — J9621 Acute and chronic respiratory failure with hypoxia: Secondary | ICD-10-CM | POA: Diagnosis not present

## 2018-05-28 DIAGNOSIS — E1122 Type 2 diabetes mellitus with diabetic chronic kidney disease: Secondary | ICD-10-CM | POA: Diagnosis not present

## 2018-05-28 DIAGNOSIS — I495 Sick sinus syndrome: Secondary | ICD-10-CM

## 2018-05-28 NOTE — Progress Notes (Signed)
Remote ICD transmission.   

## 2018-05-29 DIAGNOSIS — J690 Pneumonitis due to inhalation of food and vomit: Secondary | ICD-10-CM | POA: Diagnosis not present

## 2018-05-29 DIAGNOSIS — J9621 Acute and chronic respiratory failure with hypoxia: Secondary | ICD-10-CM | POA: Diagnosis not present

## 2018-05-29 DIAGNOSIS — I5023 Acute on chronic systolic (congestive) heart failure: Secondary | ICD-10-CM | POA: Diagnosis not present

## 2018-05-29 DIAGNOSIS — I2510 Atherosclerotic heart disease of native coronary artery without angina pectoris: Secondary | ICD-10-CM | POA: Diagnosis not present

## 2018-05-29 DIAGNOSIS — I255 Ischemic cardiomyopathy: Secondary | ICD-10-CM | POA: Diagnosis not present

## 2018-05-29 DIAGNOSIS — E1122 Type 2 diabetes mellitus with diabetic chronic kidney disease: Secondary | ICD-10-CM | POA: Diagnosis not present

## 2018-05-30 ENCOUNTER — Telehealth: Payer: Self-pay | Admitting: Pulmonary Disease

## 2018-05-30 DIAGNOSIS — Z0000 Encounter for general adult medical examination without abnormal findings: Secondary | ICD-10-CM | POA: Diagnosis not present

## 2018-05-30 DIAGNOSIS — F0390 Unspecified dementia without behavioral disturbance: Secondary | ICD-10-CM | POA: Diagnosis not present

## 2018-05-30 DIAGNOSIS — Z794 Long term (current) use of insulin: Secondary | ICD-10-CM | POA: Diagnosis not present

## 2018-05-30 DIAGNOSIS — B070 Plantar wart: Secondary | ICD-10-CM | POA: Diagnosis not present

## 2018-05-30 DIAGNOSIS — E1142 Type 2 diabetes mellitus with diabetic polyneuropathy: Secondary | ICD-10-CM | POA: Diagnosis not present

## 2018-05-30 DIAGNOSIS — Z9181 History of falling: Secondary | ICD-10-CM | POA: Diagnosis not present

## 2018-05-30 DIAGNOSIS — L97511 Non-pressure chronic ulcer of other part of right foot limited to breakdown of skin: Secondary | ICD-10-CM | POA: Diagnosis not present

## 2018-05-30 DIAGNOSIS — I5022 Chronic systolic (congestive) heart failure: Secondary | ICD-10-CM | POA: Diagnosis not present

## 2018-05-30 NOTE — Telephone Encounter (Signed)
Spoke with pt's son, [name] regarding appt tomorrow Scheduled acute ov with TP tomorrow 05-31-18 at 11:30am Nothing further needed.

## 2018-05-30 NOTE — Telephone Encounter (Signed)
Called and spoke with pt's son, Antonio Ray regarding pt has productive cough-yellow for last 2 days Offered appt with son, need to check their schedule and will call back to the office. Pt's son can be reached at [phone]

## 2018-05-30 NOTE — Telephone Encounter (Signed)
Received page to call pt's son regarding Mr. Nakamura's cough.  He has been scheduled for ROV with Tammy Parrett on 05/31/18 at 11:30 pm - please see email exchange from 05/30/18.

## 2018-05-31 ENCOUNTER — Encounter: Payer: Self-pay | Admitting: Adult Health

## 2018-05-31 ENCOUNTER — Other Ambulatory Visit: Payer: Medicare Other

## 2018-05-31 ENCOUNTER — Telehealth: Payer: Self-pay | Admitting: Adult Health

## 2018-05-31 ENCOUNTER — Ambulatory Visit (INDEPENDENT_AMBULATORY_CARE_PROVIDER_SITE_OTHER): Payer: Medicare Other | Admitting: Adult Health

## 2018-05-31 VITALS — BP 114/68 | HR 89 | Temp 98.4°F | Ht 67.64 in | Wt 169.0 lb

## 2018-05-31 DIAGNOSIS — J449 Chronic obstructive pulmonary disease, unspecified: Secondary | ICD-10-CM

## 2018-05-31 DIAGNOSIS — I255 Ischemic cardiomyopathy: Secondary | ICD-10-CM | POA: Diagnosis not present

## 2018-05-31 DIAGNOSIS — I5042 Chronic combined systolic (congestive) and diastolic (congestive) heart failure: Secondary | ICD-10-CM

## 2018-05-31 MED ORDER — ALBUTEROL SULFATE (2.5 MG/3ML) 0.083% IN NEBU: 2.5000 mg | INHALATION_SOLUTION | RESPIRATORY_TRACT | 12 refills | Status: DC | PRN

## 2018-05-31 MED ORDER — ALBUTEROL SULFATE (2.5 MG/3ML) 0.083% IN NEBU: 2.5000 mg | INHALATION_SOLUTION | RESPIRATORY_TRACT | 12 refills | Status: AC | PRN

## 2018-05-31 MED ORDER — CEFUROXIME AXETIL 500 MG PO TABS: 500.0000 mg | ORAL_TABLET | Freq: Two times a day (BID) | ORAL | 0 refills | Status: DC

## 2018-05-31 NOTE — Patient Instructions (Addendum)
Ceftin 500mg  Twice daily  For 7 days , take with food.  Mucinex DM Twice daily  As needed  Cough/congestion -flutter valve .  Continue on TRELEGY 1 puff daily , rinse after use.  Sputum culture today .  May use Albuterol Neb every 4hr as needed. -wheezing /shortness of breath .  Follow up with Dr. Lake Bells in 6-8 weeks and As needed   Please contact office for sooner follow up if symptoms do not improve or worsen or seek emergency care

## 2018-05-31 NOTE — Assessment & Plan Note (Signed)
Flare -  Check sputum cx  Add albuterol neb As needed   Hold on steroids for now   Plan  Patient Instructions  Ceftin 500mg  Twice daily  For 7 days , take with food.  Mucinex DM Twice daily  As needed  Cough/congestion -flutter valve .  Continue on TRELEGY 1 puff daily , rinse after use.  Sputum culture today .  May use Albuterol Neb every 4hr as needed. -wheezing /shortness of breath .  Follow up with Dr. Lake Bells in 6-8 weeks and As needed   Please contact office for sooner follow up if symptoms do not improve or worsen or seek emergency care

## 2018-05-31 NOTE — Progress Notes (Signed)
@Patient  ID: Antonio Ray, male    DOB: July 12, 1934, 84 y.o.   MRN: [id number]  Chief Complaint  Patient presents with  . Follow-up    COPD     Referring provider: [name], MD  HPI: 84 year old male former smoker followed for very severe COPD complicated by recurrent aspiration pneumonia due to achalasia. S/P  Botox injections in the distal esophagus late 2016 PMH significant for CHF , AICD , CKD   TEST  09/2017 Echo EF 20 to 40%, grade 2 diastolic dysfunction Endo 01/03/18 > severely dilated lumen of the esophagus, moderate amount of fluid in the entire esophagus status post dilatation 30 mm pneumatic balloon. CT chest January 01, 2018 right-sided pneumonia, diffuse distention of the esophagus Endoscopy with dilatation January 03, 2018 AFB cx + MAI 03/2018   05/31/2018  Acute OV : COPD  Patient presents for an acute office visit. Complains of increased cough with thick mucus , has increased frequency and amount. No fever, chest pain, orthopnea, edema.  Last 2 days coughing up thick green /gold mucus . Worried this will continues to get worse.  Does use flutter some but causing him to cough and he gets worried he will get strangled on the thick mucus . Has used nebs in the hospital which seemed to help.  O2 sats have been good (>90%) , not on oxygen.  Remains on TRELEGY daily.  Son is with him today and helps with his care.  Leg swelling has been better , uses lasix As needed    Allergies  Allergen Reactions  . Codeine Other (See Comments)    Gets very angry, disoriented  . Dilaudid [Hydromorphone Hcl] Other (See Comments)    VERY AGITATED, HOSTILE  . Flomax [Tamsulosin Hcl] Shortness Of Breath  . Morphine And Related Other (See Comments)    VERY AGITATED, HOSTILE  . Sulfa Antibiotics Shortness Of Breath  . Beta Adrenergic Blockers Other (See Comments)    Disorientation  . Carvedilol Other (See Comments)    DISORIENTATION    Immunization History  Administered Date(s)  Administered  . Influenza Split 05/09/2013, 06/16/2015  . Influenza, High Dose Seasonal PF 06/04/2014, 07/02/2015, 05/17/2017, 04/16/2018  . Influenza, Seasonal, Injecte, Preservative Fre 04/11/2016  . Influenza,inj,Quad PF,6+ Mos 04/11/2016  . Pneumococcal Conjugate-13 12/12/2014  . Pneumococcal Polysaccharide-23 12/22/2008  . Tdap 12/22/2008, 03/20/2013    Past Medical History:  Diagnosis Date  . Achalasia   . AICD (automatic cardioverter/defibrillator) present 03/30/2011   Analyze ST study patient  . BPH (benign prostatic hyperplasia)   . Chronic systolic dysfunction of left ventricle    EF 30-35%, CLASS II - III SYMPTOMS; intolerant to Coreg  . CKD (chronic kidney disease) stage 3, GFR 30-59 ml/min ([name]) 06/08/2012  . COPD (chronic obstructive pulmonary disease) ([place])   . Coronary artery disease    History of remote anterior MI with PCI to LAD in 2006; most recent cath 2007, no intervention required  . DOE (dyspnea on exertion)    with heavy exertion  . Dyspnea   . Dysrhythmia   . Esophageal dysmotility   . GERD (gastroesophageal reflux disease)   . Head injury, closed, with concussion 2000ish  . Heart murmur    hx  . HOH (hard of hearing)   . Hyperlipidemia   . Memory loss    improved  . Pneumonia "several times"   aspiration pna with at least 3 admits for this 2016.   Marland Kitchen PVC's (premature ventricular contractions)   .  Renal failure   . Skin cancer "several"   "forearms; head"  . Type II diabetes mellitus ([name])     Tobacco History: Social History   Tobacco Use  Smoking Status Former Smoker  . Packs/day: 1.00  . Years: 35.00  . Pack years: 35.00  . Types: Cigarettes  . Last attempt to quit: 08/01/1992  . Years since quitting: 25.8  Smokeless Tobacco Former Systems developer  . Quit date: 1994   Counseling given: Not Answered   Outpatient Medications Prior to Visit  Medication Sig Dispense Refill  . albuterol (PROVENTIL HFA;VENTOLIN HFA) 108 (90 Base) MCG/ACT inhaler  Inhale 2 puffs into the lungs every 6 (six) hours as needed for wheezing or shortness of breath. 1 Inhaler 2  . aspirin 81 MG EC tablet Take 81 mg by mouth daily.      . Cholecalciferol (VITAMIN D) 2000 units tablet Take 2,000 Units by mouth daily.     Marland Kitchen donepezil (ARICEPT) 10 MG tablet Take 1 tablet (10 mg total) by mouth daily. 90 tablet 1  . fenofibrate (TRICOR) 145 MG tablet Take 145 mg by mouth once a day 90 tablet 0  . finasteride (PROSCAR) 5 MG tablet Take 5 mg by mouth daily.  0  . Fluticasone-Umeclidin-Vilant (TRELEGY ELLIPTA) 100-62.5-25 MCG/INH AEPB Inhale 1 puff into the lungs daily. 1 each 0  . Fluticasone-Umeclidin-Vilant (TRELEGY ELLIPTA) 100-62.5-25 MCG/INH AEPB Inhale 1 puff into the lungs daily. 1 each 5  . furosemide (LASIX) 20 MG tablet Take 1 tablet (20 mg total) by mouth daily. Take an additional 20mg  if your weight is up 3lbs in a day 5lbs in a week 35 tablet 0  . glucose blood (FREESTYLE TEST STRIPS) test strip Use as instructed 3x a day 200 each 3  . insulin aspart (NOVOLOG) 100 UNIT/ML injection Inject 4-10 Units into the skin 3 (three) times daily before meals. Inject up to 6 units 3x a day before meals - pens please 15 mL 5  . Insulin Glargine (BASAGLAR KWIKPEN) 100 UNIT/ML SOPN INJECT  6 UNITS UNDER THE SKIN EVERY MORNING (Patient taking differently: Inject 6 Units into the skin daily. ) 15 mL 0  . Insulin Pen Needle 32G X 4 MM MISC Use 1x a day 200 each 3  . loratadine (CLARITIN) 10 MG tablet Take 1 tablet (10 mg total) by mouth daily. 30 tablet 1  . memantine (NAMENDA) 10 MG tablet Take 1 tablet (10 mg total) by mouth 2 (two) times daily. 180 tablet 1  . Multiple Vitamins-Minerals (PRESERVISION AREDS 2) CAPS Take 1 capsule by mouth 2 (two) times daily.    . nitroGLYCERIN (NITRODUR - DOSED IN MG/24 HR) 0.2 mg/hr patch Place 1 patch (0.2 mg total) onto the skin daily. Alternates patch placement with odd/even days. 30 patch 6  . pantoprazole (PROTONIX) 40 MG tablet Take 1  tablet (40 mg total) by mouth daily.    . pravastatin (PRAVACHOL) 40 MG tablet Take 40 mg by mouth daily.     No facility-administered medications prior to visit.      Review of Systems  Constitutional:   No  weight loss, night sweats,  Fevers, chills, fatigue, or  lassitude.  HEENT:   No headaches,  Difficulty swallowing,  Tooth/dental problems, or  Sore throat,                No sneezing, itching, ear ache, nasal congestion, post nasal drip,   CV:  No chest pain,  Orthopnea, PND, swelling in lower  extremities, anasarca, dizziness, palpitations, syncope.   GI  No heartburn, indigestion, abdominal pain, nausea, vomiting, diarrhea, change in bowel habits, loss of appetite, bloody stools.   Resp: No shortness of breath with exertion or at rest.  No excess mucus, no productive cough,  No non-productive cough,  No coughing up of blood.  No change in color of mucus.  No wheezing.  No chest wall deformity  Skin: no rash or lesions.  GU: no dysuria, change in color of urine, no urgency or frequency.  No flank pain, no hematuria   MS:  No joint pain or swelling.  No decreased range of motion.  No back pain.    Physical Exam  BP 114/68 (BP Location: Left Arm, Cuff Size: Normal)   Pulse 89   Temp 98.4 F (36.9 C) (Oral)   Ht 5' 7.64" (1.718 m)   Wt 169 lb (76.7 kg)   SpO2 96%   BMI 25.97 kg/m   GEN: A/Ox3; pleasant , NAD, frail and elderly    HEENT:  [place]/AT,  EACs-clear, TMs-wnl, NOSE-clear, THROAT-clear, no lesions, no postnasal drip or exudate noted.   NECK:  Supple w/ fair ROM; no JVD; normal carotid impulses w/o bruits; no thyromegaly or nodules palpated; no lymphadenopathy.    RESP  Few trace rhonchi w/o, wheezes/ rales/ no accessory muscle use, no dullness to percussion  CARD:  RRR, no m/r/g, tr  peripheral edema, pulses intact, no cyanosis or clubbing.  GI:   Soft & nt; nml bowel sounds; no organomegaly or masses detected.   Musco: Warm bil, no deformities or joint  swelling noted.   Neuro: alert, no focal deficits noted.    Skin: Warm, no lesions or rashes    Lab Results:  CBC  ProBNP  Imaging: No results found.    No flowsheet data found.  No results found for: NITRICOXIDE      Assessment & Plan:   COPD GOLD IV Flare -  Check sputum cx  Add albuterol neb As needed   Hold on steroids for now   Plan  Patient Instructions  Ceftin 500mg  Twice daily  For 7 days , take with food.  Mucinex DM Twice daily  As needed  Cough/congestion -flutter valve .  Continue on TRELEGY 1 puff daily , rinse after use.  Sputum culture today .  May use Albuterol Neb every 4hr as needed. -wheezing /shortness of breath .  Follow up with Dr. Lake Bells in 6-8 weeks and As needed   Please contact office for sooner follow up if symptoms do not improve or worsen or seek emergency care        Chronic combined systolic and diastolic congestive heart failure ([place]) Appears compensated  Cont on current regimen .      Rexene Edison, NP 05/31/2018

## 2018-05-31 NOTE — Telephone Encounter (Addendum)
Called and spoke to pt's son, [name] [facility]). [name] states that Rx for Ceftin 500mg  was not received by pharmacy. Rx for Ceftin has been sent to preferred pharmacy. [name] also requested update on albuterol neb solution.  Per our records, Rx for albuterol neb was not sent to pharmacy. Per [name] pt does not currently have a neb machine.  Rx for albuterol nebs has been sent to [facility]. Order for neb machine has been sent to [facility]. Nothing further is needed.    2. Parrett, Fonnie Mu, NP (Nurse Practitioner) at 05/31/2018 12:03 PM - Signed    Ceftin 500mg  Twice daily  For 7 days , take with food.  Mucinex DM Twice daily  As needed  Cough/congestion -flutter valve .  Continue on TRELEGY 1 puff daily , rinse after use.  Sputum culture today .  May use Albuterol Neb every 4hr as needed. -wheezing /shortness of breath .  Follow up with Dr. Lake Bells in 6-8 weeks and As needed   Please contact office for sooner follow up if symptoms do not improve or worsen or seek emergency care

## 2018-05-31 NOTE — Assessment & Plan Note (Signed)
Appears compensated  Cont on current regimen .

## 2018-05-31 NOTE — Addendum Note (Signed)
Addended by: Maryanna Shape A on: 05/31/2018 03:29 PM   Modules accepted: Orders

## 2018-06-01 ENCOUNTER — Telehealth: Payer: Self-pay | Admitting: Pulmonary Disease

## 2018-06-01 DIAGNOSIS — J449 Chronic obstructive pulmonary disease, unspecified: Secondary | ICD-10-CM

## 2018-06-01 NOTE — Telephone Encounter (Signed)
Spoke with Clair Gulling and notified of recs per Dr. Lake Bells  He verbalized understanding  Order placed for PT

## 2018-06-01 NOTE — Telephone Encounter (Signed)
Called and spoke with Clair Gulling, PT [facility].  He stated that the Patient finished [place] [facility] this week and he will finish [place] next week.  Clair Gulling, PT recommended out patient rehab for pulmonary rehab and balance,endurance.  He stated that they will speak with Patient's son, to see if they had a preference or if they had worked with a previous out patient rehab facility.  Will route to Dr. Lake Bells as Juluis Rainier

## 2018-06-01 NOTE — Telephone Encounter (Signed)
Left message for Jim to call back.

## 2018-06-01 NOTE — Progress Notes (Signed)
Reviewed, agree 

## 2018-06-01 NOTE — Telephone Encounter (Signed)
Pulmonary rehab might be too much for him but outpatient rehab (PT) for balance and strength would probably be effective

## 2018-06-03 LAB — RESPIRATORY CULTURE OR RESPIRATORY AND SPUTUM CULTURE
MICRO NUMBER:: 91312103
SPECIMEN QUALITY: ADEQUATE

## 2018-06-05 ENCOUNTER — Other Ambulatory Visit: Payer: Self-pay | Admitting: Adult Health

## 2018-06-05 MED ORDER — AMOXICILLIN-POT CLAVULANATE 875-125 MG PO TABS: 1.0000 | ORAL_TABLET | Freq: Two times a day (BID) | ORAL | 0 refills | Status: DC

## 2018-06-05 NOTE — Progress Notes (Signed)
Called spoke with patient's son [name], advised of sputum cultre results / recs as stated by TP.  Per [name], if patient has any improvement it is "incremental" and would like to have patient begin the Augmentin 875mg  twice daily for 7 days.  Orders only encounter created for the Rx.  Appt scheduled with TP for follow up on 11.21.20 @ 1400.

## 2018-06-06 ENCOUNTER — Other Ambulatory Visit: Payer: Self-pay | Admitting: *Deleted

## 2018-06-06 MED ORDER — FENOFIBRATE 145 MG PO TABS: ORAL_TABLET | ORAL | 1 refills | Status: AC

## 2018-06-06 MED ORDER — NITROGLYCERIN 0.2 MG/HR TD PT24: 0.2000 mg | MEDICATED_PATCH | Freq: Every day | TRANSDERMAL | 6 refills | Status: AC

## 2018-06-08 ENCOUNTER — Ambulatory Visit (INDEPENDENT_AMBULATORY_CARE_PROVIDER_SITE_OTHER): Payer: Medicare Other | Admitting: Internal Medicine

## 2018-06-08 ENCOUNTER — Encounter: Payer: Self-pay | Admitting: Internal Medicine

## 2018-06-08 VITALS — BP 110/56 | HR 86 | Ht 68.0 in | Wt 167.4 lb

## 2018-06-08 DIAGNOSIS — I5042 Chronic combined systolic (congestive) and diastolic (congestive) heart failure: Secondary | ICD-10-CM

## 2018-06-08 DIAGNOSIS — I255 Ischemic cardiomyopathy: Secondary | ICD-10-CM

## 2018-06-08 LAB — CUP PACEART INCLINIC DEVICE CHECK
Battery Remaining Longevity: 26 mo
Battery Remaining Percentage: 28 %
Brady Statistic RV Percent Paced: 1 % — CL
Date Time Interrogation Session: 20191108142522
HighPow Impedance: 73 Ohm
Implantable Lead Location: 753860
Implantable Pulse Generator Implant Date: 20120829
Lead Channel Pacing Threshold Amplitude: 0.5 V
Lead Channel Pacing Threshold Amplitude: 1 V
Lead Channel Pacing Threshold Pulse Width: 0.5 ms
Lead Channel Sensing Intrinsic Amplitude: 12 mV
Lead Channel Setting Pacing Amplitude: 2 V
Lead Channel Setting Pacing Amplitude: 2.5 V
Lead Channel Setting Pacing Pulse Width: 0.5 ms
Lead Channel Setting Sensing Sensitivity: 0.5 mV
MDC IDC LEAD IMPLANT DT: 20120829
MDC IDC LEAD IMPLANT DT: 20120829
MDC IDC LEAD LOCATION: 753859
MDC IDC MSMT LEADCHNL RA IMPEDANCE VALUE: 380 Ohm
MDC IDC MSMT LEADCHNL RA PACING THRESHOLD PULSEWIDTH: 0.5 ms
MDC IDC MSMT LEADCHNL RA SENSING INTR AMPL: 2.1 mV
MDC IDC MSMT LEADCHNL RV IMPEDANCE VALUE: 360 Ohm
MDC IDC PG SERIAL: 828604
MDC IDC STAT BRADY RA PERCENT PACED: 35 %

## 2018-06-08 NOTE — Patient Instructions (Addendum)
Medication Instructions:  Your physician recommends that you continue on your current medications as directed. Please refer to the Current Medication list given to you today.  If you need a refill on your cardiac medications before your next appointment, please call your pharmacy.   Lab work: None ordered  Testing/Procedures: None ordered  Follow-Up: Remote monitoring is used to monitor your Pacemaker of ICD from home. This monitoring reduces the number of office visits required to check your device to one time per year. It allows [place] to keep an eye on the functioning of your device to ensure it is working properly. You are scheduled for a device check from home on 08/27/2018. You may send your transmission at any time that day. If you have a wireless device, the transmission will be sent automatically. After your physician reviews your transmission, you will receive a postcard with your next transmission date.   At [facility], you and your health needs are our priority.  As part of our continuing mission to provide you with exceptional heart care, we have created designated Provider Care Teams.  These Care Teams include your primary Cardiologist (physician) and Advanced Practice Providers (APPs -  Physician Assistants and Nurse Practitioners) who all work together to provide you with the care you need, when you need it. You will need a follow up appointment in 1 years with Chanetta Marshall, NP.  Thank you for choosing [facility]!!

## 2018-06-08 NOTE — Progress Notes (Signed)
PCP: [name], MD Primary Cardiologist: Dr Martinique Primary EP:  Dr Dianah Field Antonio Ray is a 84 y.o. male who presents today for routine electrophysiology followup.  Since last being seen in our clinic, the patient reports doing very well.  Today, he denies symptoms of palpitations, chest pain, shortness of breath,  lower extremity edema, dizziness, presyncope, or syncope.  The patient is otherwise without complaint today.   Past Medical History:  Diagnosis Date  . Achalasia   . AICD (automatic cardioverter/defibrillator) present 03/30/2011   Analyze ST study patient  . BPH (benign prostatic hyperplasia)   . Chronic systolic dysfunction of left ventricle    EF 30-35%, CLASS II - III SYMPTOMS; intolerant to Coreg  . CKD (chronic kidney disease) stage 3, GFR 30-59 ml/min ([name]) 06/08/2012  . COPD (chronic obstructive pulmonary disease) ([place])   . Coronary artery disease    History of remote anterior MI with PCI to LAD in 2006; most recent cath 2007, no intervention required  . DOE (dyspnea on exertion)    with heavy exertion  . Dyspnea   . Dysrhythmia   . Esophageal dysmotility   . GERD (gastroesophageal reflux disease)   . Head injury, closed, with concussion 2000ish  . Heart murmur    hx  . HOH (hard of hearing)   . Hyperlipidemia   . Memory loss    improved  . Pneumonia "several times"   aspiration pna with at least 3 admits for this 2016.   Marland Kitchen PVC's (premature ventricular contractions)   . Renal failure   . Skin cancer "several"   "forearms; head"  . Type II diabetes mellitus ([place])    Past Surgical History:  Procedure Laterality Date  . BALLOON DILATION N/A 09/09/2015   Procedure: BALLOON DILATION;  Surgeon: Mauri Pole, MD;  Location: [place] [facility];  Service: Endoscopy;  Laterality: N/A;  rigiflex achalasia balloon dilators  . BOTOX INJECTION N/A 04/08/2015   Procedure: BOTOX INJECTION;  Surgeon: Milus Banister, MD;  Location: [place];  Service:  Endoscopy;  Laterality: N/A;  . CARDIAC CATHETERIZATION  01/11/2006   DEMONSTRATES AKINESIA OF THE DISTAL ANTERIOR WALL, DISTAL INFERIOR WALL AND AKINESIA OF THE APEX. THE BASAL SEGMENTS CONTRACT WELL AND OVERALL EF 35%  . CATARACT EXTRACTION W/ INTRAOCULAR LENS  IMPLANT, BILATERAL  08/2014-09/2014  . CHOLECYSTECTOMY  2/11  . CORONARY ANGIOPLASTY  1994   TO THE LAD  . ESOPHAGEAL MANOMETRY N/A 03/16/2015   Procedure: ESOPHAGEAL MANOMETRY (EM);  Surgeon: Jerene Bears, MD;  Location: [facility];  Service: Gastroenterology;  Laterality: N/A;  . ESOPHAGOGASTRODUODENOSCOPY N/A 04/08/2015   Procedure: ESOPHAGOGASTRODUODENOSCOPY (EGD);  Surgeon: Milus Banister, MD;  Location: [place];  Service: Endoscopy;  Laterality: N/A;  . ESOPHAGOGASTRODUODENOSCOPY N/A 01/03/2018   Procedure: ESOPHAGOGASTRODUODENOSCOPY (EGD);  Surgeon: Mauri Pole, MD;  Location: [facility];  Service: Endoscopy;  Laterality: N/A;  . ESOPHAGOGASTRODUODENOSCOPY (EGD) WITH PROPOFOL N/A 09/09/2015   Procedure: ESOPHAGOGASTRODUODENOSCOPY (EGD) WITH PROPOFOL;  Surgeon: Mauri Pole, MD;  Location: [place] [facility];  Service: Endoscopy;  Laterality: N/A;  pt. is to have gastrografin xray post recovery-do not discharge until results are back  . EYE SURGERY    . FOOT FRACTURE SURGERY Right 1980's  . INSERT / REPLACE / REMOVE PACEMAKER    . SAVORY DILATION N/A 01/03/2018   Procedure: RIGIFLEX DILATION;  Surgeon: Mauri Pole, MD;  Location: [place] [facility];  Service: Endoscopy;  Laterality: N/A;  . SKIN CANCER EXCISION  "  several"   "forearms, head"    ROS- all systems are reviewed and negative except as per HPI above  Current Outpatient Medications  Medication Sig Dispense Refill  . albuterol (PROVENTIL HFA;VENTOLIN HFA) 108 (90 Base) MCG/ACT inhaler Inhale 2 puffs into the lungs every 6 (six) hours as needed for wheezing or shortness of breath. 1 Inhaler 2  . albuterol (PROVENTIL) (2.5 MG/3ML) 0.083% nebulizer solution  Take 3 mLs (2.5 mg total) by nebulization every 4 (four) hours as needed for wheezing or shortness of breath. J44.9 75 mL 12  . amoxicillin-clavulanate (AUGMENTIN) 875-125 MG tablet Take 1 tablet by mouth 2 (two) times daily. 14 tablet 0  . aspirin 81 MG EC tablet Take 81 mg by mouth daily.      . cefUROXime (CEFTIN) 500 MG tablet Take 1 tablet (500 mg total) by mouth 2 (two) times daily with a meal. 14 tablet 0  . Cholecalciferol (VITAMIN D) 2000 units tablet Take 2,000 Units by mouth daily.     Marland Kitchen donepezil (ARICEPT) 10 MG tablet Take 1 tablet (10 mg total) by mouth daily. 90 tablet 1  . fenofibrate (TRICOR) 145 MG tablet Take 145 mg by mouth once a day 90 tablet 1  . finasteride (PROSCAR) 5 MG tablet Take 5 mg by mouth daily.  0  . Fluticasone-Umeclidin-Vilant (TRELEGY ELLIPTA) 100-62.5-25 MCG/INH AEPB Inhale 1 puff into the lungs daily. 1 each 0  . furosemide (LASIX) 20 MG tablet Take 1 tablet (20 mg total) by mouth daily. Take an additional 20mg  if your weight is up 3lbs in a day 5lbs in a week 35 tablet 0  . glucose blood (FREESTYLE TEST STRIPS) test strip Use as instructed 3x a day 200 each 3  . insulin aspart (NOVOLOG) 100 UNIT/ML injection Inject 4-10 Units into the skin 3 (three) times daily before meals. Inject up to 6 units 3x a day before meals - pens please 15 mL 5  . Insulin Glargine (BASAGLAR KWIKPEN) 100 UNIT/ML SOPN INJECT  6 UNITS UNDER THE SKIN EVERY MORNING (Patient taking differently: Inject 6 Units into the skin daily. ) 15 mL 0  . Insulin Pen Needle 32G X 4 MM MISC Use 1x a day 200 each 3  . loratadine (CLARITIN) 10 MG tablet Take 1 tablet (10 mg total) by mouth daily. 30 tablet 1  . memantine (NAMENDA) 10 MG tablet Take 1 tablet (10 mg total) by mouth 2 (two) times daily. 180 tablet 1  . Multiple Vitamins-Minerals (PRESERVISION AREDS 2) CAPS Take 1 capsule by mouth 2 (two) times daily.    . nitroGLYCERIN (NITRODUR - DOSED IN MG/24 HR) 0.2 mg/hr patch Place 1 patch (0.2 mg  total) onto the skin daily. Alternates patch placement with odd/even days. 30 patch 6  . pantoprazole (PROTONIX) 40 MG tablet Take 1 tablet (40 mg total) by mouth daily.    . pravastatin (PRAVACHOL) 40 MG tablet Take 40 mg by mouth daily.     No current facility-administered medications for this visit.     Physical Exam: Vitals:   06/08/18 1111  BP: (!) 110/56  Pulse: 86  SpO2: 97%  Weight: 167 lb 6.4 oz (75.9 kg)  Height: 5\' 8"  (1.727 m)    GEN- The patient is well appearing, alert and oriented x 3 today.   Head- normocephalic, atraumatic Eyes-  Sclera clear, conjunctiva pink Ears- hearing intact Oropharynx- clear Lungs- Clear to ausculation bilaterally, normal work of breathing Chest- ICD pocket is well healed Heart- Regular rate  and rhythm, no murmurs, rubs or gallops, PMI not laterally displaced GI- soft, NT, ND, + BS Extremities- no clubbing, cyanosis, or edema  ICD interrogation- reviewed in detail today,  See PACEART report   Assessment and Plan:  1. Chronic systolic dysfunction/ ischemic CM/ CAD euvolemic No ischemic symptoms Normal ICD function See Pace Art report No changes today Battery integrity alert turned on today   Merlin Return to see EP NP every year Follow-up with Dr Martinique as scheduled  Thompson Grayer MD, [facility] 06/08/2018 11:24 AM

## 2018-06-11 ENCOUNTER — Ambulatory Visit (INDEPENDENT_AMBULATORY_CARE_PROVIDER_SITE_OTHER): Payer: Medicare Other | Admitting: Internal Medicine

## 2018-06-11 ENCOUNTER — Encounter: Payer: Self-pay | Admitting: Internal Medicine

## 2018-06-11 VITALS — BP 102/58 | HR 79 | Ht 68.0 in | Wt 171.0 lb

## 2018-06-11 DIAGNOSIS — Z794 Long term (current) use of insulin: Secondary | ICD-10-CM

## 2018-06-11 DIAGNOSIS — N183 Chronic kidney disease, stage 3 unspecified: Secondary | ICD-10-CM

## 2018-06-11 DIAGNOSIS — E785 Hyperlipidemia, unspecified: Secondary | ICD-10-CM

## 2018-06-11 DIAGNOSIS — E1122 Type 2 diabetes mellitus with diabetic chronic kidney disease: Secondary | ICD-10-CM

## 2018-06-11 DIAGNOSIS — I255 Ischemic cardiomyopathy: Secondary | ICD-10-CM | POA: Diagnosis not present

## 2018-06-11 LAB — POCT GLYCOSYLATED HEMOGLOBIN (HGB A1C): Hemoglobin A1C: 6.2 % — AB (ref 4.0–5.6)

## 2018-06-11 MED ORDER — INSULIN ASPART 100 UNIT/ML ~~LOC~~ SOLN: 2.0000 [IU] | Freq: Three times a day (TID) | SUBCUTANEOUS | 5 refills | Status: AC

## 2018-06-11 NOTE — Progress Notes (Signed)
Patient ID: Antonio Ray, male   DOB: Dec 23, 1933, 84 y.o.   MRN: [id number]   HPI: Antonio Ray is a 84 y.o.-year-old male, returning for f/u for DM2, dx in 2010, insulin-dependent since ~2012, uncontrolled, with complications (PN, CKD stage 3-4, CAD, CHF - ICD).  He is here with his son who offers most of the history as patient is mostly nonverbal, especially regarding blood sugar checks, his meals, and his insulin dosing. Last visit 4 months ago.  Patient has severe COPD complicated with aspiration pneumonia due to esophageal achalasia.  He gets Botox injections.  He also has Solu-Medrol and prednisone tapers for his COPD exacerbations.  He continues to have frequent admissions/ED visits for the above conditions.  Last hemoglobin A1c was: Lab Results  Component Value Date   HGBA1C 6.6 (H) 01/02/2018   HGBA1C 6.9 (H) 10/13/2017   HGBA1C 6.9 10/02/2017   He is on: - Basaglar 10 >> 6 units in am >> stopped after last visit - Novolog 2-4 (occasionall 6 or 8) units before meals He adds 1-2 units to Novolog to the mealtime insulin if on steroids.  Pt checks his sugars 3 times a day per review of his log: - am: 76, 84-127 >> 77-120 >> 106-138, 192 - 2h after b'fast: 151-246 >> n/c >> 90-164 >> n/c - before lunch:  139-188, 216 >> 99-130 >> 82, 111-162 - 2h after lunch:n/c >> 108-165, 185 >> n/c - before dinner: 95-163, 184 >> 95-154 >> 88, 110-210 (after Ensure, choc. Chip cookie) - 2h after dinner: n/c >> 79, 139-183 >> n/c - bedtime: n/c >> 114-187 >> n/c - nighttime: n/c Lowest sugar was 69 >> 76 >> 77 >> 82. Highest sugar was 253 >> 200s (steroids) >> 183 >> 210.  Glucometer: Freestyle Lite  Pt's meals are: - Breakfast: bacon, eggs, hash browns - Lunch: sandwich or soup - Dinner: varies, meat + veggies - Snacks: 1-2   -+ CKD, last BUN/creatinine:  Lab Results  Component Value Date   BUN 48 (H) 04/27/2018   BUN 36 (H) 04/24/2018   CREATININE 1.96 (H) 04/27/2018    CREATININE 1.81 (H) 04/24/2018   -+ HL; last set of lipids: 11/01/2017: 126/94/50/57 02/05/2016: 144/223/40/59 Lab Results  Component Value Date   CHOL 136 06/20/2013   HDL 32.00 (L) 06/20/2013   LDLCALC 76 06/20/2013   TRIG 142.0 06/20/2013   CHOLHDL 4 06/20/2013  On pravastatin. - last eye exam was in 11/2017: No DR reportedly. Dr. Marica Otter. Had cataract Sx in 2016.  History of macular degeneration. - no numbness and tingling in his feet.  Has a foot ulcer 11/2014 >> healed well (2016). He has DM shoes. R foot more swollen after his fx >> 20 years ago.  He sees a podiatrist.  ROS: Constitutional: no weight gain/no weight loss, no fatigue, no subjective hyperthermia, no subjective hypothermia Eyes: no blurry vision, no xerophthalmia ENT: no sore throat, no nodules palpated in neck, no dysphagia, no odynophagia, no hoarseness Cardiovascular: no CP/no SOB/no palpitations/no leg swelling Respiratory: no cough/no SOB/no wheezing Gastrointestinal: no N/no V/no D/no C/no acid reflux Musculoskeletal: no muscle aches/no joint aches Skin: no rashes, no hair loss Neurological: no tremors/no numbness/no tingling/no dizziness  I reviewed pt's medications, allergies, PMH, social hx, family hx, and changes were documented in the history of present illness. Otherwise, unchanged from my initial visit note.  Past Medical History:  Diagnosis Date  . Achalasia   . AICD (automatic cardioverter/defibrillator) present 03/30/2011  Analyze ST study patient  . BPH (benign prostatic hyperplasia)   . Chronic systolic dysfunction of left ventricle    EF 30-35%, CLASS II - III SYMPTOMS; intolerant to Coreg  . CKD (chronic kidney disease) stage 3, GFR 30-59 ml/min ([name]) 06/08/2012  . COPD (chronic obstructive pulmonary disease) ([place])   . Coronary artery disease    History of remote anterior MI with PCI to LAD in 2006; most recent cath 2007, no intervention required  . DOE (dyspnea on exertion)     with heavy exertion  . Dyspnea   . Dysrhythmia   . Esophageal dysmotility   . GERD (gastroesophageal reflux disease)   . Head injury, closed, with concussion 2000ish  . Heart murmur    hx  . HOH (hard of hearing)   . Hyperlipidemia   . Memory loss    improved  . Pneumonia "several times"   aspiration pna with at least 3 admits for this 2016.   Marland Kitchen PVC's (premature ventricular contractions)   . Renal failure   . Skin cancer "several"   "forearms; head"  . Type II diabetes mellitus ([place])    Past Surgical History:  Procedure Laterality Date  . BALLOON DILATION N/A 09/09/2015   Procedure: BALLOON DILATION;  Surgeon: Mauri Pole, MD;  Location: [facility];  Service: Endoscopy;  Laterality: N/A;  rigiflex achalasia balloon dilators  . BOTOX INJECTION N/A 04/08/2015   Procedure: BOTOX INJECTION;  Surgeon: Milus Banister, MD;  Location: [facility];  Service: Endoscopy;  Laterality: N/A;  . CARDIAC CATHETERIZATION  01/11/2006   DEMONSTRATES AKINESIA OF THE DISTAL ANTERIOR WALL, DISTAL INFERIOR WALL AND AKINESIA OF THE APEX. THE BASAL SEGMENTS CONTRACT WELL AND OVERALL EF 35%  . CATARACT EXTRACTION W/ INTRAOCULAR LENS  IMPLANT, BILATERAL  08/2014-09/2014  . CHOLECYSTECTOMY  2/11  . CORONARY ANGIOPLASTY  1994   TO THE LAD  . ESOPHAGEAL MANOMETRY N/A 03/16/2015   Procedure: ESOPHAGEAL MANOMETRY (EM);  Surgeon: Jerene Bears, MD;  Location: [facility];  Service: Gastroenterology;  Laterality: N/A;  . ESOPHAGOGASTRODUODENOSCOPY N/A 04/08/2015   Procedure: ESOPHAGOGASTRODUODENOSCOPY (EGD);  Surgeon: Milus Banister, MD;  Location: [facility];  Service: Endoscopy;  Laterality: N/A;  . ESOPHAGOGASTRODUODENOSCOPY N/A 01/03/2018   Procedure: ESOPHAGOGASTRODUODENOSCOPY (EGD);  Surgeon: Mauri Pole, MD;  Location: [facility];  Service: Endoscopy;  Laterality: N/A;  . ESOPHAGOGASTRODUODENOSCOPY (EGD) WITH PROPOFOL N/A 09/09/2015   Procedure: ESOPHAGOGASTRODUODENOSCOPY (EGD) WITH PROPOFOL;   Surgeon: Mauri Pole, MD;  Location: [facility];  Service: Endoscopy;  Laterality: N/A;  pt. is to have gastrografin xray post recovery-do not discharge until results are back  . EYE SURGERY    . FOOT FRACTURE SURGERY Right 1980's  . INSERT / REPLACE / REMOVE PACEMAKER    . SAVORY DILATION N/A 01/03/2018   Procedure: RIGIFLEX DILATION;  Surgeon: Mauri Pole, MD;  Location: [facility];  Service: Endoscopy;  Laterality: N/A;  . SKIN CANCER EXCISION  "several"   "forearms, head"   Social History   Socioeconomic History  . Marital status: Widowed    Spouse name: Not on file  . Number of children: 2  . Years of education: GED  . Highest education level: Not on file  Occupational History  . Occupation: Clinical biochemist: [employer]    Comment: Retired  . Occupation: [employer]    Employer: [employer]    Comment: Retired  Scientific laboratory technician  . Financial resource strain: Patient refused  .  Food insecurity:    Worry: Never true    Inability: Never true  . Transportation needs:    Medical: No    Non-medical: No  Tobacco Use  . Smoking status: Former Smoker    Packs/day: 1.00    Years: 35.00    Pack years: 35.00    Types: Cigarettes    Last attempt to quit: 08/01/1992    Years since quitting: 25.8  . Smokeless tobacco: Former Systems developer    Quit date: 1994  Substance and Sexual Activity  . Alcohol use: No    Alcohol/week: 0.0 standard drinks  . Drug use: No  . Sexual activity: Not Currently  Lifestyle  . Physical activity:    Days per week: Patient refused    Minutes per session: Patient refused  . Stress: Patient refused  Relationships  . Social connections:    Talks on phone: Not on file    Gets together: Not on file    Attends religious service: Not on file    Active member of club or organization: Not on file    Attends meetings of clubs or organizations: Not on file    Relationship status: Not on file  . Intimate partner violence:    Fear of  current or ex partner: Not on file    Emotionally abused: Not on file    Physically abused: Not on file    Forced sexual activity: Not on file  Other Topics Concern  . Not on file  Social History Narrative   Pt lives in [place] alone.  Widowed.   Right-handed.   Rare caffeine use - maybe one cup per month.   Current Outpatient Medications on File Prior to Visit  Medication Sig Dispense Refill  . albuterol (PROVENTIL HFA;VENTOLIN HFA) 108 (90 Base) MCG/ACT inhaler Inhale 2 puffs into the lungs every 6 (six) hours as needed for wheezing or shortness of breath. 1 Inhaler 2  . albuterol (PROVENTIL) (2.5 MG/3ML) 0.083% nebulizer solution Take 3 mLs (2.5 mg total) by nebulization every 4 (four) hours as needed for wheezing or shortness of breath. J44.9 75 mL 12  . amoxicillin-clavulanate (AUGMENTIN) 875-125 MG tablet Take 1 tablet by mouth 2 (two) times daily. 14 tablet 0  . aspirin 81 MG EC tablet Take 81 mg by mouth daily.      . cefUROXime (CEFTIN) 500 MG tablet Take 1 tablet (500 mg total) by mouth 2 (two) times daily with a meal. 14 tablet 0  . Cholecalciferol (VITAMIN D) 2000 units tablet Take 2,000 Units by mouth daily.     Marland Kitchen donepezil (ARICEPT) 10 MG tablet Take 1 tablet (10 mg total) by mouth daily. 90 tablet 1  . fenofibrate (TRICOR) 145 MG tablet Take 145 mg by mouth once a day 90 tablet 1  . finasteride (PROSCAR) 5 MG tablet Take 5 mg by mouth daily.  0  . Fluticasone-Umeclidin-Vilant (TRELEGY ELLIPTA) 100-62.5-25 MCG/INH AEPB Inhale 1 puff into the lungs daily. 1 each 0  . furosemide (LASIX) 20 MG tablet Take 1 tablet (20 mg total) by mouth daily. Take an additional 32m if your weight is up 3lbs in a day 5lbs in a week 35 tablet 0  . glucose blood (FREESTYLE TEST STRIPS) test strip Use as instructed 3x a day 200 each 3  . insulin aspart (NOVOLOG) 100 UNIT/ML injection Inject 4-10 Units into the skin 3 (three) times daily before meals. Inject up to 6 units 3x a day before meals -  pens please 15  mL 5  . Insulin Glargine (BASAGLAR KWIKPEN) 100 UNIT/ML SOPN INJECT  6 UNITS UNDER THE SKIN EVERY MORNING (Patient taking differently: Inject 6 Units into the skin daily. ) 15 mL 0  . Insulin Pen Needle 32G X 4 MM MISC Use 1x a day 200 each 3  . loratadine (CLARITIN) 10 MG tablet Take 1 tablet (10 mg total) by mouth daily. 30 tablet 1  . memantine (NAMENDA) 10 MG tablet Take 1 tablet (10 mg total) by mouth 2 (two) times daily. 180 tablet 1  . Multiple Vitamins-Minerals (PRESERVISION AREDS 2) CAPS Take 1 capsule by mouth 2 (two) times daily.    . nitroGLYCERIN (NITRODUR - DOSED IN MG/24 HR) 0.2 mg/hr patch Place 1 patch (0.2 mg total) onto the skin daily. Alternates patch placement with odd/even days. 30 patch 6  . pantoprazole (PROTONIX) 40 MG tablet Take 1 tablet (40 mg total) by mouth daily.    . pravastatin (PRAVACHOL) 40 MG tablet Take 40 mg by mouth daily.     No current facility-administered medications on file prior to visit.    Allergies  Allergen Reactions  . Codeine Other (See Comments)    Gets very angry, disoriented  . Dilaudid [Hydromorphone Hcl] Other (See Comments)    VERY AGITATED, HOSTILE  . Flomax [Tamsulosin Hcl] Shortness Of Breath  . Morphine And Related Other (See Comments)    VERY AGITATED, HOSTILE  . Sulfa Antibiotics Shortness Of Breath  . Beta Adrenergic Blockers Other (See Comments)    Disorientation  . Carvedilol Other (See Comments)    DISORIENTATION   Family History  Problem Relation Age of Onset  . Stroke Father        Mother history unknown - never met her  . Venous thrombosis Sister   . Colon cancer Neg Hx     PE: BP (!) 102/58   Pulse 79   Ht 5' 8"  (1.727 m) Comment: measured  Wt 171 lb (77.6 kg)   SpO2 92%   BMI 26.00 kg/m  Wt Readings from Last 3 Encounters:  06/11/18 171 lb (77.6 kg)  06/08/18 167 lb 6.4 oz (75.9 kg)  05/31/18 169 lb (76.7 kg)   Constitutional: overweight, in NAD Eyes: PERRLA, EOMI, no  exophthalmos ENT: moist mucous membranes, no thyromegaly, no cervical lymphadenopathy Cardiovascular: RRR, No MRG, + slight bilateral pitting edema R>L, chronic Respiratory: CTA B Gastrointestinal: abdomen soft, NT, ND, BS+ Musculoskeletal: no deformities, strength intact in all 4 Skin: moist, warm, no rashes Neurological: no tremor with outstretched hands, DTR normal in all 4  ASSESSMENT: 1. DM2, insulin-dependent, uncontrolled, with complications - PN - stable - CKD stage 3-4  - CAD, CHF - ICD  2. HL  3.  Overweight  PLAN:  1. Patient with long-standing, previously uncontrolled, type 2 diabetes, insulin-dependent, on basal-bolus insulin regimen, with improving control.  We were able to decrease his insulin doses gradually.  His HbA1c improved even on the lower insulin doses.  At last visit, sugars are mostly at goal and an HbA1c was 6.6%.  Reviewing his sugar logs, sugars remain controlled even if you skip NovoLog when he was eating a small meal.  At that time, I advised him to try to stop Basaglar for at least a week to see how his sugars change.  They did not have to restart Basaglar.  The son gave him Basaglar for a few days during prednisone treatment, but not recently.  They continue on NovoLog only, between 2 and 10 units  before meals.  - His son gave him 10 units of NovoLog before meals few times, but the sugars did decrease too much, to 80s, so I advised him to keep the NovoLog doses lower than 10 units.  We can stay off Crocker for now, since sugars are close to goal. - I suggested to:  Patient Instructions  Please continue: - Novolog: 2-4 units before regular meals  6-8 units before larger meals  Please return in 4-6 months with your sugar log.   - today, HbA1c is 6.2% (better) - continue checking sugars at different times of the day - check 3x a day, rotating checks - advised for yearly eye exams >> he is UTD - Return to clinic in 4-6 mo with sugar log     2. HL -  Reviewed latest lipid panel from 10/2017: All fractions at goal - Continues atorvastatin without side effects.  3.  Overweight -Patient gained 8 pounds net since last visit -They are trying to maintain his nutrition by also adding Ensure in the afternoon  Philemon Kingdom, MD PhD [facility] Endocrinology

## 2018-06-11 NOTE — Patient Instructions (Addendum)
Please continue: - Novolog: 2-4 units before regular meals  6-8 units before larger meals  Please return in 4-6 months with your sugar log.

## 2018-06-11 NOTE — Addendum Note (Signed)
Addended by: Cardell Peach I on: 06/11/2018 03:55 PM   Modules accepted: Orders

## 2018-06-21 ENCOUNTER — Encounter: Payer: Self-pay | Admitting: Adult Health

## 2018-06-21 ENCOUNTER — Ambulatory Visit (INDEPENDENT_AMBULATORY_CARE_PROVIDER_SITE_OTHER): Payer: Medicare Other | Admitting: Adult Health

## 2018-06-21 ENCOUNTER — Ambulatory Visit (INDEPENDENT_AMBULATORY_CARE_PROVIDER_SITE_OTHER)
Admission: RE | Admit: 2018-06-21 | Discharge: 2018-06-21 | Disposition: A | Discharge status: Home / Self Care | Payer: Medicare Other | Source: Ambulatory Visit | Attending: Adult Health | Admitting: Adult Health

## 2018-06-21 ENCOUNTER — Telehealth: Payer: Self-pay | Admitting: Adult Health

## 2018-06-21 VITALS — BP 104/62 | HR 85 | Ht 68.0 in | Wt 167.2 lb

## 2018-06-21 DIAGNOSIS — I255 Ischemic cardiomyopathy: Secondary | ICD-10-CM

## 2018-06-21 DIAGNOSIS — I5042 Chronic combined systolic (congestive) and diastolic (congestive) heart failure: Secondary | ICD-10-CM

## 2018-06-21 DIAGNOSIS — J449 Chronic obstructive pulmonary disease, unspecified: Secondary | ICD-10-CM

## 2018-06-21 DIAGNOSIS — J441 Chronic obstructive pulmonary disease with (acute) exacerbation: Secondary | ICD-10-CM | POA: Diagnosis not present

## 2018-06-21 NOTE — Assessment & Plan Note (Signed)
Recent exacerbation with E. coli isolated in sputum culture.  Patient with significant improvement after antibiotics with Augmentin. She is encouraged on pulmonary hygiene regimen with flutter valve and guaifenesin. We will check chest x-ray today as previous chest x-ray in September shows some bilateral effusion and increased atelectasis in the bases.  Plan  Patient Instructions  Continue on TRELEGY 1 puff daily , rinse after use.  Continue on current regimen.  Aspiration precautions .  Mucinex Twice daily  As needed  Cough/congestion -flutter valve .  Continue on TRELEGY 1 puff daily , rinse after use.  Chest Xray today .  May use Albuterol Neb every 4hr as needed. -wheezing /shortness of breath .  Follow up with Dr. Lake Bells in 2- months and As needed   Please contact office for sooner follow up if symptoms do not improve or worsen or seek emergency care

## 2018-06-21 NOTE — Assessment & Plan Note (Signed)
Appears compensated -does not appear to be volume overloaded acutely.  Patient does have some chronic lower extremity swelling.  Patient to use Lasix as needed.  Continue with daily weights.  Continue on a low-salt diet.  He did follow with cardiology

## 2018-06-21 NOTE — Telephone Encounter (Signed)
Notes recorded by Melvenia Needles, NP on 06/21/2018 at 3:31 PM EST Chest xray is better with smaller effusions .  Cont w/ ov recs  Please contact office for sooner follow up if symptoms do not improve or worsen or seek emergency care   Pt's son, Alan(DPR) is aware of results and voiced his understanding.  Nothing further is needed.

## 2018-06-21 NOTE — Patient Instructions (Addendum)
Continue on TRELEGY 1 puff daily , rinse after use.  Continue on current regimen.  Aspiration precautions .  Mucinex Twice daily  As needed  Cough/congestion -flutter valve .  Continue on TRELEGY 1 puff daily , rinse after use.  Chest Xray today .  May use Albuterol Neb every 4hr as needed. -wheezing /shortness of breath .  Follow up with Dr. Lake Bells in 2- months and As needed   Please contact office for sooner follow up if symptoms do not improve or worsen or seek emergency care

## 2018-06-21 NOTE — Progress Notes (Signed)
@Patient  ID: Antonio Ray, male    DOB: March 22, 1934, 84 y.o.   MRN: [id number]  Chief Complaint  Patient presents with  . Follow-up    COPD    Referring provider: [name], MD  HPI: 84 year old male former smoker followed for very severe COPD complicated by recurrentaspirationpneumoniadue toachalasia. S/PBotox injections in the distal esophagus late 2016 PMH significant for CHF , AICD , CKD   TEST/EVENTS :  09/2017 EchoEF 20 to 02%, grade 2 diastolic dysfunction  Endo 01/03/18 >severely dilated lumen of the esophagus, moderate amount of fluid in the entire esophagus status post dilatation 30 mm pneumatic balloon.  CT chest January 01, 2018 right-sided pneumonia, diffuse distention of the esophagus  Endoscopy with dilatation January 03, 2018  AFB cx + MAI 03/2018   06/21/2018 Follow up : COPD  Patient returns for a 3-week follow-up.  Patient was seen last visit for an acute office visit with increased productive cough with thick green mucus.  Patient was treated for COPD exacerbation.  Sputum culture returned for E. coli sensitive to Augmentin.  Patient was treated with a seven-day course of Augmentin.  He returns today with significant improvement in symptoms with decreased cough and congestion.  Has some lingering cough especially in the morning with thick mucus.  Says it makes him feel short of breath once he is up and going is says that it gets better.  Denies any shortness of breath currently.  Is able to walk on a flat surface without any significant change in breathing status.  He remains on Trelegy daily.  Has chronic leg swelling.  Says he uses Lasix.  Has to be careful as he has chronic kidney disease and can cause balance issues if he gets over diuresed.  Patient is accompanied by his son who is 1 of his caregivers.  Allergies  Allergen Reactions  . Codeine Other (See Comments)    Gets very angry, disoriented  . Dilaudid [Hydromorphone Hcl] Other (See Comments)     VERY AGITATED, HOSTILE  . Flomax [Tamsulosin Hcl] Shortness Of Breath  . Morphine And Related Other (See Comments)    VERY AGITATED, HOSTILE  . Sulfa Antibiotics Shortness Of Breath  . Beta Adrenergic Blockers Other (See Comments)    Disorientation  . Carvedilol Other (See Comments)    DISORIENTATION    Immunization History  Administered Date(s) Administered  . Influenza Split 05/09/2013, 06/16/2015  . Influenza, High Dose Seasonal PF 06/04/2014, 07/02/2015, 05/17/2017, 04/16/2018  . Influenza, Seasonal, Injecte, Preservative Fre 04/11/2016  . Influenza,inj,Quad PF,6+ Mos 04/11/2016  . Pneumococcal Conjugate-13 12/12/2014  . Pneumococcal Polysaccharide-23 12/22/2008  . Tdap 12/22/2008, 03/20/2013    Past Medical History:  Diagnosis Date  . Achalasia   . AICD (automatic cardioverter/defibrillator) present 03/30/2011   Analyze ST study patient  . BPH (benign prostatic hyperplasia)   . Chronic systolic dysfunction of left ventricle    EF 30-35%, CLASS II - III SYMPTOMS; intolerant to Coreg  . CKD (chronic kidney disease) stage 3, GFR 30-59 ml/min ([name]) 06/08/2012  . COPD (chronic obstructive pulmonary disease) ([place])   . Coronary artery disease    History of remote anterior MI with PCI to LAD in 2006; most recent cath 2007, no intervention required  . DOE (dyspnea on exertion)    with heavy exertion  . Dyspnea   . Dysrhythmia   . Esophageal dysmotility   . GERD (gastroesophageal reflux disease)   . Head injury, closed, with concussion 2000ish  .  Heart murmur    hx  . HOH (hard of hearing)   . Hyperlipidemia   . Memory loss    improved  . Pneumonia "several times"   aspiration pna with at least 3 admits for this 2016.   Marland Kitchen PVC's (premature ventricular contractions)   . Renal failure   . Skin cancer "several"   "forearms; head"  . Type II diabetes mellitus ([name])     Tobacco History: Social History   Tobacco Use  Smoking Status Former Smoker  . Packs/day: 1.00    . Years: 35.00  . Pack years: 35.00  . Types: Cigarettes  . Last attempt to quit: 08/01/1992  . Years since quitting: 25.9  Smokeless Tobacco Former Systems developer  . Quit date: 1994   Counseling given: Not Answered   Outpatient Medications Prior to Visit  Medication Sig Dispense Refill  . albuterol (PROVENTIL HFA;VENTOLIN HFA) 108 (90 Base) MCG/ACT inhaler Inhale 2 puffs into the lungs every 6 (six) hours as needed for wheezing or shortness of breath. 1 Inhaler 2  . albuterol (PROVENTIL) (2.5 MG/3ML) 0.083% nebulizer solution Take 3 mLs (2.5 mg total) by nebulization every 4 (four) hours as needed for wheezing or shortness of breath. J44.9 75 mL 12  . aspirin 81 MG EC tablet Take 81 mg by mouth daily.      . Cholecalciferol (VITAMIN D) 2000 units tablet Take 2,000 Units by mouth daily.     Marland Kitchen donepezil (ARICEPT) 10 MG tablet Take 1 tablet (10 mg total) by mouth daily. 90 tablet 1  . fenofibrate (TRICOR) 145 MG tablet Take 145 mg by mouth once a day 90 tablet 1  . finasteride (PROSCAR) 5 MG tablet Take 5 mg by mouth daily.  0  . Fluticasone-Umeclidin-Vilant (TRELEGY ELLIPTA) 100-62.5-25 MCG/INH AEPB Inhale 1 puff into the lungs daily. 1 each 0  . furosemide (LASIX) 20 MG tablet Take 1 tablet (20 mg total) by mouth daily. Take an additional 20mg  if your weight is up 3lbs in a day 5lbs in a week 35 tablet 0  . glucose blood (FREESTYLE TEST STRIPS) test strip Use as instructed 3x a day 200 each 3  . insulin aspart (NOVOLOG) 100 UNIT/ML injection Inject 2-8 Units into the skin 3 (three) times daily before meals. - pens please 15 mL 5  . Insulin Pen Needle 32G X 4 MM MISC Use 1x a day 200 each 3  . loratadine (CLARITIN) 10 MG tablet Take 1 tablet (10 mg total) by mouth daily. 30 tablet 1  . memantine (NAMENDA) 10 MG tablet Take 1 tablet (10 mg total) by mouth 2 (two) times daily. 180 tablet 1  . Multiple Vitamins-Minerals (PRESERVISION AREDS 2) CAPS Take 1 capsule by mouth 2 (two) times daily.    .  nitroGLYCERIN (NITRODUR - DOSED IN MG/24 HR) 0.2 mg/hr patch Place 1 patch (0.2 mg total) onto the skin daily. Alternates patch placement with odd/even days. 30 patch 6  . pantoprazole (PROTONIX) 40 MG tablet Take 1 tablet (40 mg total) by mouth daily.    . pravastatin (PRAVACHOL) 40 MG tablet Take 40 mg by mouth daily.    Marland Kitchen amoxicillin-clavulanate (AUGMENTIN) 875-125 MG tablet Take 1 tablet by mouth 2 (two) times daily. 14 tablet 0  . cefUROXime (CEFTIN) 500 MG tablet Take 1 tablet (500 mg total) by mouth 2 (two) times daily with a meal. (Patient not taking: Reported on 06/21/2018) 14 tablet 0   No facility-administered medications prior to visit.  Review of Systems  Constitutional:   No  weight loss, night sweats,  Fevers, chills,  =fatigue, or  lassitude.  HEENT:   No headaches,  Difficulty swallowing,  Tooth/dental problems, or  Sore throat,                No sneezing, itching, ear ache,  +nasal congestion, post nasal drip,   CV:  No chest pain,  Orthopnea, PND,+swelling in lower extremities,  No anasarca, dizziness, palpitations, syncope.   GI  No heartburn, indigestion, abdominal pain, nausea, vomiting, diarrhea, change in bowel habits, loss of appetite, bloody stools.   Resp:    No chest wall deformity  Skin: no rash or lesions.  GU: no dysuria, change in color of urine, no urgency or frequency.  No flank pain, no hematuria   MS:  No joint pain or swelling.  No decreased range of motion.  No back pain.    Physical Exam  BP 104/62 (BP Location: Left Arm, Cuff Size: Normal)   Pulse 85   Ht 5\' 8"  (1.727 m)   Wt 167 lb 3.2 oz (75.8 kg)   SpO2 96%   BMI 25.42 kg/m   GEN: A/Ox3; pleasant , NAD, elderly , frail .    HEENT:  [place]/AT,  EACs-clear, TMs-wnl, NOSE-clear, THROAT-clear, no lesions, no postnasal drip or exudate noted.   NECK:  Supple w/ fair ROM; no JVD; normal carotid impulses w/o bruits; no thyromegaly or nodules palpated; no lymphadenopathy.    RESP   Few trace rhonchi   no accessory muscle use, no dullness to percussion  CARD:  RRR, no m/r/g, 1+ peripheral edema, pulses intact, no cyanosis or clubbing.  GI:   Soft & nt; nml bowel sounds; no organomegaly or masses detected.   Musco: Warm bil, no deformities or joint swelling noted.   Neuro: alert, no focal deficits noted.    Skin: Warm, no lesions or rashes    Lab Results:  CBC    Component Value Date/Time   WBC 9.4 04/24/2018 0403   RBC 3.92 (L) 04/24/2018 0403   HGB 11.0 (L) 04/24/2018 0403   HCT 35.6 (L) 04/24/2018 0403   PLT 278 04/24/2018 0403   MCV 90.8 04/24/2018 0403   MCV 93.8 05/19/2012 1022   MCH 28.1 04/24/2018 0403   MCHC 30.9 04/24/2018 0403   RDW 14.3 04/24/2018 0403   LYMPHSABS 2.4 04/24/2018 0403   MONOABS 0.9 04/24/2018 0403   EOSABS 0.3 04/24/2018 0403   BASOSABS 0.0 04/24/2018 0403    BMET    Component Value Date/Time   NA 138 04/27/2018 1254   NA 142 04/16/2018 1451   K 4.1 04/27/2018 1254   CL 97 04/27/2018 1254   CO2 34 (H) 04/27/2018 1254   GLUCOSE 251 (H) 04/27/2018 1254   BUN 48 (H) 04/27/2018 1254   BUN 45 (H) 04/16/2018 1451   CREATININE 1.96 (H) 04/27/2018 1254   CREATININE 1.48 (H) 04/24/2015 1227   CALCIUM 9.5 04/27/2018 1254   GFRNONAA 33 (L) 04/24/2018 0403   GFRAA 38 (L) 04/24/2018 0403    BNP    Component Value Date/Time   BNP 1,049.4 (H) 04/24/2018 0520    ProBNP    Component Value Date/Time   PROBNP 820.0 (H) 04/27/2018 1254    Imaging: Dg Chest 2 View  Result Date: 06/21/2018 CLINICAL DATA:  COPD, mixed type EXAM: CHEST - 2 VIEW COMPARISON:  04/24/2018 FINDINGS: Bilateral trace pleural effusions that are stable. Chronic cardiomegaly with stable dual-chamber ICD/pacer  positioning. Large lung volumes in the setting of COPD. No consolidation. No pneumothorax. IMPRESSION: Stable hyperinflation and trace pleural effusions. Electronically Signed   By: Monte Fantasia M.D.   On: 06/21/2018 15:20      No  flowsheet data found.  No results found for: NITRICOXIDE      Assessment & Plan:   COPD with acute exacerbation ([place]) Recent exacerbation with E. coli isolated in sputum culture.  Patient with significant improvement after antibiotics with Augmentin. She is encouraged on pulmonary hygiene regimen with flutter valve and guaifenesin. We will check chest x-ray today as previous chest x-ray in September shows some bilateral effusion and increased atelectasis in the bases.  Plan  Patient Instructions  Continue on TRELEGY 1 puff daily , rinse after use.  Continue on current regimen.  Aspiration precautions .  Mucinex Twice daily  As needed  Cough/congestion -flutter valve .  Continue on TRELEGY 1 puff daily , rinse after use.  Chest Xray today .  May use Albuterol Neb every 4hr as needed. -wheezing /shortness of breath .  Follow up with Dr. Lake Bells in 2- months and As needed   Please contact office for sooner follow up if symptoms do not improve or worsen or seek emergency care        Chronic combined systolic and diastolic congestive heart failure ([place]) Appears compensated -does not appear to be volume overloaded acutely.  Patient does have some chronic lower extremity swelling.  Patient to use Lasix as needed.  Continue with daily weights.  Continue on a low-salt diet.  He did follow with cardiology     Rexene Edison, NP 06/21/2018

## 2018-06-25 NOTE — Progress Notes (Signed)
Reviewed, agree 

## 2018-06-29 ENCOUNTER — Other Ambulatory Visit: Payer: Self-pay | Admitting: Neurology

## 2018-06-29 NOTE — Telephone Encounter (Signed)
Dr. Lake Bells please advise on patients e-mail below.   Hi, just an update. Dad says he feels okay. He does report SOB in the mornings that goes away. We've increased neb treatments to twice a day. He's still coughing up mucous each day. I'm just checking in with you to see if any additional antibiotic is in order? No fever, no chills nothing like that or at least none he's reported beyond the initial morning SOB.

## 2018-07-02 MED ORDER — AZITHROMYCIN 250 MG PO TABS: ORAL_TABLET | ORAL | 0 refills | Status: AC

## 2018-07-05 DIAGNOSIS — L219 Seborrheic dermatitis, unspecified: Secondary | ICD-10-CM | POA: Diagnosis not present

## 2018-07-05 DIAGNOSIS — L821 Other seborrheic keratosis: Secondary | ICD-10-CM | POA: Diagnosis not present

## 2018-07-05 DIAGNOSIS — D225 Melanocytic nevi of trunk: Secondary | ICD-10-CM | POA: Diagnosis not present

## 2018-07-05 DIAGNOSIS — Z85828 Personal history of other malignant neoplasm of skin: Secondary | ICD-10-CM | POA: Diagnosis not present

## 2018-07-05 DIAGNOSIS — L570 Actinic keratosis: Secondary | ICD-10-CM | POA: Diagnosis not present

## 2018-07-05 DIAGNOSIS — Z23 Encounter for immunization: Secondary | ICD-10-CM | POA: Diagnosis not present

## 2018-07-11 DIAGNOSIS — L603 Nail dystrophy: Secondary | ICD-10-CM | POA: Diagnosis not present

## 2018-07-11 DIAGNOSIS — L84 Corns and callosities: Secondary | ICD-10-CM | POA: Diagnosis not present

## 2018-07-11 DIAGNOSIS — I739 Peripheral vascular disease, unspecified: Secondary | ICD-10-CM | POA: Diagnosis not present

## 2018-07-11 DIAGNOSIS — E1151 Type 2 diabetes mellitus with diabetic peripheral angiopathy without gangrene: Secondary | ICD-10-CM | POA: Diagnosis not present

## 2018-07-20 MED ORDER — PRAVASTATIN SODIUM 40 MG PO TABS: 40.0000 mg | ORAL_TABLET | Freq: Every day | ORAL | 2 refills | Status: AC

## 2018-07-23 ENCOUNTER — Ambulatory Visit: Payer: Federal, State, Local not specified - PPO | Admitting: Neurology

## 2018-07-30 ENCOUNTER — Other Ambulatory Visit: Payer: Self-pay | Admitting: Neurology

## 2018-07-30 LAB — CUP PACEART REMOTE DEVICE CHECK
Date Time Interrogation Session: 20191230120751
Implantable Lead Implant Date: 20120829
Implantable Lead Location: 753860
MDC IDC LEAD IMPLANT DT: 20120829
MDC IDC LEAD LOCATION: 753859
MDC IDC PG IMPLANT DT: 20120829
MDC IDC PG SERIAL: 828604

## 2018-07-30 NOTE — Telephone Encounter (Signed)
BQ please see email from son [name].  Pt had one episode of prod cough with green/gray mucus, with no other symptoms.  I advised son to call our office if this persists or if his symptoms worsen.    Please advise if you have any additional recs.

## 2018-07-31 ENCOUNTER — Emergency Department (HOSPITAL_COMMUNITY)
Admission: EM | Admit: 2018-07-31 | Discharge: 2018-07-31 | Disposition: A | Discharge status: Home / Self Care | Payer: Medicare Other | Attending: Emergency Medicine | Admitting: Emergency Medicine

## 2018-07-31 ENCOUNTER — Emergency Department (HOSPITAL_COMMUNITY): Payer: Medicare Other

## 2018-07-31 ENCOUNTER — Other Ambulatory Visit: Payer: Self-pay

## 2018-07-31 ENCOUNTER — Telehealth: Payer: Self-pay

## 2018-07-31 ENCOUNTER — Encounter (HOSPITAL_COMMUNITY): Payer: Self-pay | Admitting: Emergency Medicine

## 2018-07-31 DIAGNOSIS — J441 Chronic obstructive pulmonary disease with (acute) exacerbation: Secondary | ICD-10-CM | POA: Diagnosis not present

## 2018-07-31 DIAGNOSIS — Z794 Long term (current) use of insulin: Secondary | ICD-10-CM | POA: Insufficient documentation

## 2018-07-31 DIAGNOSIS — N183 Chronic kidney disease, stage 3 (moderate): Secondary | ICD-10-CM | POA: Insufficient documentation

## 2018-07-31 DIAGNOSIS — E785 Hyperlipidemia, unspecified: Secondary | ICD-10-CM | POA: Insufficient documentation

## 2018-07-31 DIAGNOSIS — E1122 Type 2 diabetes mellitus with diabetic chronic kidney disease: Secondary | ICD-10-CM | POA: Diagnosis not present

## 2018-07-31 DIAGNOSIS — R0902 Hypoxemia: Secondary | ICD-10-CM | POA: Diagnosis not present

## 2018-07-31 DIAGNOSIS — Z7982 Long term (current) use of aspirin: Secondary | ICD-10-CM | POA: Insufficient documentation

## 2018-07-31 DIAGNOSIS — Z95810 Presence of automatic (implantable) cardiac defibrillator: Secondary | ICD-10-CM | POA: Diagnosis not present

## 2018-07-31 DIAGNOSIS — J80 Acute respiratory distress syndrome: Secondary | ICD-10-CM | POA: Diagnosis not present

## 2018-07-31 DIAGNOSIS — I2510 Atherosclerotic heart disease of native coronary artery without angina pectoris: Secondary | ICD-10-CM | POA: Diagnosis not present

## 2018-07-31 DIAGNOSIS — Z79899 Other long term (current) drug therapy: Secondary | ICD-10-CM | POA: Insufficient documentation

## 2018-07-31 DIAGNOSIS — Z87891 Personal history of nicotine dependence: Secondary | ICD-10-CM | POA: Diagnosis not present

## 2018-07-31 DIAGNOSIS — N189 Chronic kidney disease, unspecified: Secondary | ICD-10-CM | POA: Diagnosis not present

## 2018-07-31 DIAGNOSIS — I499 Cardiac arrhythmia, unspecified: Secondary | ICD-10-CM | POA: Diagnosis not present

## 2018-07-31 DIAGNOSIS — I5023 Acute on chronic systolic (congestive) heart failure: Secondary | ICD-10-CM | POA: Diagnosis not present

## 2018-07-31 DIAGNOSIS — R0602 Shortness of breath: Secondary | ICD-10-CM | POA: Diagnosis not present

## 2018-07-31 DIAGNOSIS — R0689 Other abnormalities of breathing: Secondary | ICD-10-CM | POA: Diagnosis not present

## 2018-07-31 DIAGNOSIS — I509 Heart failure, unspecified: Secondary | ICD-10-CM | POA: Diagnosis not present

## 2018-07-31 LAB — CBC WITH DIFFERENTIAL/PLATELET
Abs Immature Granulocytes: 0.01 10*3/uL (ref 0.00–0.07)
BASOS ABS: 0 10*3/uL (ref 0.0–0.1)
Basophils Relative: 1 %
EOS PCT: 5 %
Eosinophils Absolute: 0.4 10*3/uL (ref 0.0–0.5)
HCT: 43.7 % (ref 39.0–52.0)
Hemoglobin: 12.6 g/dL — ABNORMAL LOW (ref 13.0–17.0)
Immature Granulocytes: 0 %
Lymphocytes Relative: 25 %
Lymphs Abs: 1.9 10*3/uL (ref 0.7–4.0)
MCH: 24.8 pg — ABNORMAL LOW (ref 26.0–34.0)
MCHC: 28.8 g/dL — AB (ref 30.0–36.0)
MCV: 85.9 fL (ref 80.0–100.0)
Monocytes Absolute: 0.7 10*3/uL (ref 0.1–1.0)
Monocytes Relative: 9 %
Neutro Abs: 4.5 10*3/uL (ref 1.7–7.7)
Neutrophils Relative %: 60 %
Platelets: 207 10*3/uL (ref 150–400)
RBC: 5.09 MIL/uL (ref 4.22–5.81)
RDW: 15.9 % — ABNORMAL HIGH (ref 11.5–15.5)
WBC: 7.4 10*3/uL (ref 4.0–10.5)
nRBC: 0 % (ref 0.0–0.2)

## 2018-07-31 LAB — BASIC METABOLIC PANEL
Anion gap: 11 (ref 5–15)
BUN: 39 mg/dL — ABNORMAL HIGH (ref 8–23)
CO2: 27 mmol/L (ref 22–32)
Calcium: 9.7 mg/dL (ref 8.9–10.3)
Chloride: 103 mmol/L (ref 98–111)
Creatinine, Ser: 1.81 mg/dL — ABNORMAL HIGH (ref 0.61–1.24)
GFR calc Af Amer: 39 mL/min — ABNORMAL LOW (ref >60)
GFR calc non Af Amer: 34 mL/min — ABNORMAL LOW (ref >60)
Glucose, Bld: 130 mg/dL — ABNORMAL HIGH (ref 70–99)
Potassium: 4.1 mmol/L (ref 3.5–5.1)
Sodium: 141 mmol/L (ref 135–145)

## 2018-07-31 LAB — BRAIN NATRIURETIC PEPTIDE: B Natriuretic Peptide: 1253.8 pg/mL — ABNORMAL HIGH (ref 0.0–100.0)

## 2018-07-31 LAB — I-STAT TROPONIN, ED: Troponin i, poc: 0.03 ng/mL (ref 0.00–0.08)

## 2018-07-31 NOTE — Telephone Encounter (Signed)
Spoke to patient's son [name] about recent email.He stated sister called EMS this morning father sob.Stated BNP was elevated.CXR- no pneumonia. Stated he was told to call cardiologist for any medication changes.Stated father's weight has been stable.Stated sister gave him a extra 20 mg lasix before ems arrived.Stated he is breathing better at present.He has appointment with Dr.Jordan 1/16. Advised I will send message to [place] for advice.

## 2018-07-31 NOTE — ED Notes (Signed)
Helped get patient undress on  The monitor did ekg shown to Dr Winfred Leeds patient is resting with call bell in reach

## 2018-07-31 NOTE — ED Provider Notes (Signed)
Antonio Ray   CSN: [id number] Arrival date & time: 07/31/18  0827     History   Chief Complaint Chief Complaint  Patient presents with  . Shortness of Breath    HPI Antonio Ray is a 84 y.o. male.  HPI level 5 caveat patient has memory impairment.  History is obtained from patient's son, from patient and from paramedics.  Patient has had chronic dyspnea with exertion and last week had cough productive of grayish sputum.  He was noted to be more dyspneic this morning.  He was treated at home with an albuterol nebulized treatment and given Lasix 40 mg ,which is double his normal dose prior to coming here.  On arrival patient is asymptomatic.  Denies any chest pain denies shortness of breath.  Has had no fever. no other associated symptoms.  No change in weight  Past Medical History:  Diagnosis Date  . Achalasia   . AICD (automatic cardioverter/defibrillator) present 03/30/2011   Analyze ST study patient  . BPH (benign prostatic hyperplasia)   . Chronic systolic dysfunction of left ventricle    EF 30-35%, CLASS II - III SYMPTOMS; intolerant to Coreg  . CKD (chronic kidney disease) stage 3, GFR 30-59 ml/min ([name]) 06/08/2012  . COPD (chronic obstructive pulmonary disease) ([place])   . Coronary artery disease    History of remote anterior MI with PCI to LAD in 2006; most recent cath 2007, no intervention required  . DOE (dyspnea on exertion)    with heavy exertion  . Dyspnea   . Dysrhythmia   . Esophageal dysmotility   . GERD (gastroesophageal reflux disease)   . Head injury, closed, with concussion 2000ish  . Heart murmur    hx  . HOH (hard of hearing)   . Hyperlipidemia   . Memory loss    improved  . Pneumonia "several times"   aspiration pna with at least 3 admits for this 2016.   Marland Kitchen PVC's (premature ventricular contractions)   . Renal failure   . Skin cancer "several"   "forearms; head"  . Type II diabetes mellitus  [facility])     Patient Active Problem List   Diagnosis Date Noted  . Goals of care, counseling/discussion   . Palliative care by specialist   . Altered mental status 04/05/2018  . Acute hepatic encephalopathy   . Chronic respiratory failure with hypoxia ([place]) 02/22/2018  . Acute on chronic respiratory failure with hypoxia ([place]) 02/13/2018  . Aspiration pneumonia of both lower lobes due to gastric secretions ([place]) 01/02/2018  . SIRS (systemic inflammatory response syndrome) ([place]) 01/01/2018  . Generalized weakness 12/19/2017  . Hypoxemia   . Pneumonia, aspiration ([place]) 10/13/2017  . CAP (community acquired pneumonia) 08/19/2017  . Influenza A with pneumonia 08/19/2017  . Skin cancer   . HOH (hard of hearing)   . Heart murmur   . Head injury, closed, with concussion   . Dysrhythmia   . DOE (dyspnea on exertion)   . Anginal pain ([place])   . Pneumonia 06/12/2017  . Acute UTI   . Altered mental state 02/15/2017  . Achalasia of esophagus   . Encephalopathy   . SOB (shortness of breath)   . Acute respiratory failure ([place]) 04/21/2016  . Chronic combined systolic and diastolic congestive heart failure ([place]) 04/21/2016  . Memory loss 04/21/2016  . Allergic rhinitis 04/11/2016  . Mild cognitive impairment 08/18/2015  . AKI (acute kidney injury) ([place])   . Type 2  diabetes mellitus with stage 3 chronic kidney disease, with long-term current use of insulin ([place])   . Chronic kidney disease, stage III (moderate) ([name])   . Other specified hypotension   . Other emphysema ([place])   . AICD (automatic cardioverter/defibrillator) present   . CAD S/P percutaneous coronary angioplasty   . Cardiomyopathy, ischemic 04/03/2015  . Elevated troponin 04/03/2015  . Sepsis ([place]) 03/31/2015  . Aspiration into airway 03/21/2015  . Dysphagia   . Esophageal dysmotilities   . COPD with acute exacerbation ([place]) 01/14/2015  . Left ventricular apical thrombus 01/14/2015  . Acute on chronic systolic congestive heart  failure ([place]) 01/14/2015  . ARF (acute renal failure) ([place]) 01/14/2015  . COPD GOLD IV 10/07/2013  . Leukocytosis 09/11/2013  . Acute encephalopathy 09/10/2013  . GERD (gastroesophageal reflux disease) 09/26/2011  . BPH (benign prostatic hyperplasia) 08/22/2011  . COPD (chronic obstructive pulmonary disease) ([place]) 07/17/2011  . Coronary artery disease   . Sick sinus syndrome ([place])   . Acute on chronic combined systolic and diastolic congestive heart failure, NYHA class 3 ([place])   . Hyperlipidemia   . PVC's (premature ventricular contractions)   . Myocardial infarction [facility]) 08/01/1992    Past Surgical History:  Procedure Laterality Date  . BALLOON DILATION N/A 09/09/2015   Procedure: BALLOON DILATION;  Surgeon: Mauri Pole, MD;  Location: [place] [facility];  Service: Endoscopy;  Laterality: N/A;  rigiflex achalasia balloon dilators  . BOTOX INJECTION N/A 04/08/2015   Procedure: BOTOX INJECTION;  Surgeon: Milus Banister, MD;  Location: [place];  Service: Endoscopy;  Laterality: N/A;  . CARDIAC CATHETERIZATION  01/11/2006   DEMONSTRATES AKINESIA OF THE DISTAL ANTERIOR WALL, DISTAL INFERIOR WALL AND AKINESIA OF THE APEX. THE BASAL SEGMENTS CONTRACT WELL AND OVERALL EF 35%  . CATARACT EXTRACTION W/ INTRAOCULAR LENS  IMPLANT, BILATERAL  08/2014-09/2014  . CHOLECYSTECTOMY  2/11  . CORONARY ANGIOPLASTY  1994   TO THE LAD  . ESOPHAGEAL MANOMETRY N/A 03/16/2015   Procedure: ESOPHAGEAL MANOMETRY (EM);  Surgeon: Jerene Bears, MD;  Location: [facility];  Service: Gastroenterology;  Laterality: N/A;  . ESOPHAGOGASTRODUODENOSCOPY N/A 04/08/2015   Procedure: ESOPHAGOGASTRODUODENOSCOPY (EGD);  Surgeon: Milus Banister, MD;  Location: [place];  Service: Endoscopy;  Laterality: N/A;  . ESOPHAGOGASTRODUODENOSCOPY N/A 01/03/2018   Procedure: ESOPHAGOGASTRODUODENOSCOPY (EGD);  Surgeon: Mauri Pole, MD;  Location[facility] ENDOSCOPY;  Service: Endoscopy;  Laterality: N/A;  .  ESOPHAGOGASTRODUODENOSCOPY (EGD) WITH PROPOFOL N/A 09/09/2015   Procedure: ESOPHAGOGASTRODUODENOSCOPY (EGD) WITH PROPOFOL;  Surgeon: Mauri Pole, MD;  Location: [place] [facility];  Service: Endoscopy;  Laterality: N/A;  pt. is to have gastrografin xray post recovery-do not discharge until results are back  . EYE SURGERY    . FOOT FRACTURE SURGERY Right 1980's  . INSERT / REPLACE / REMOVE PACEMAKER    . SAVORY DILATION N/A 01/03/2018   Procedure: RIGIFLEX DILATION;  Surgeon: Mauri Pole, MD;  Location: [place] [facility]COPY;  Service: Endoscopy;  Laterality: N/A;  . SKIN CANCER EXCISION  "several"   "forearms, head"        Home Medications    Prior to Admission medications   Medication Sig Start Date End Date Taking? Authorizing Provider  albuterol (PROVENTIL HFA;VENTOLIN HFA) 108 (90 Base) MCG/ACT inhaler Inhale 2 puffs into the lungs every 6 (six) hours as needed for wheezing or shortness of breath. 11/16/17   Juanito Doom, MD  albuterol (PROVENTIL) (2.5 MG/3ML) 0.083% nebulizer solution Take 3 mLs (2.5 mg total) by nebulization every 4 (  four) hours as needed for wheezing or shortness of breath. J44.9 05/31/18   Parrett, Fonnie Mu, NP  aspirin 81 MG EC tablet Take 81 mg by mouth daily.      [provider]  azithromycin (ZITHROMAX) 250 MG tablet Take 2 pills today then one a day for 4 additional days 07/02/18   Juanito Doom, MD  Cholecalciferol (VITAMIN D) 2000 units tablet Take 2,000 Units by mouth daily.     [provider]  donepezil (ARICEPT) 10 MG tablet Take 1 tablet (10 mg total) by mouth daily. 05/03/18   Dennie Bible, NP  fenofibrate [facility]) 145 MG tablet Take 145 mg by mouth once a day 06/06/18   Martinique, Peter M, MD  finasteride (PROSCAR) 5 MG tablet Take 5 mg by mouth daily. 05/19/17   [provider]  Fluticasone-Umeclidin-Vilant (TRELEGY ELLIPTA) 100-62.5-25 MCG/INH AEPB Inhale 1 puff into the lungs daily. 05/18/18   Juanito Doom,  MD  furosemide (LASIX) 20 MG tablet Take 1 tablet (20 mg total) by mouth daily. Take an additional 25m if your weight is up 3lbs in a day 5lbs in a week 04/16/18   Barrett, REvelene Croon PA-C  glucose blood (FREESTYLE TEST STRIPS) test strip Use as instructed 3x a day 02/05/18   GPhilemon Kingdom MD  insulin aspart (NOVOLOG) 100 UNIT/ML injection Inject 2-8 Units into the skin 3 (three) times daily before meals. - pens please 06/11/18   GPhilemon Kingdom MD  Insulin Pen Needle 32G X 4 MM MISC Use 1x a day 02/05/18   GPhilemon Kingdom MD  loratadine (CLARITIN) 10 MG tablet Take 1 tablet (10 mg total) by mouth daily. 07/02/15   MBarton Dubois MD  memantine (NAMENDA) 10 MG tablet Take 1 tablet (10 mg total) by mouth 2 (two) times daily. 05/03/18   MDennie Bible NP  Multiple Vitamins-Minerals (PRESERVISION AREDS 2) CAPS Take 1 capsule by mouth 2 (two) times daily.    [provider]  nitroGLYCERIN (NITRODUR - DOSED IN MG/24 HR) 0.2 mg/hr patch Place 1 patch (0.2 mg total) onto the skin daily. Alternates patch placement with odd/even days. 06/06/18   JMartinique Peter M, MD  pantoprazole (PROTONIX) 40 MG tablet Take 1 tablet (40 mg total) by mouth daily. 04/09/18   Hongalgi, ALenis Dickinson MD  pravastatin (PRAVACHOL) 40 MG tablet Take 1 tablet (40 mg total) by mouth daily. 07/20/18   JMartinique Peter M, MD    Family History Family History  Problem Relation Age of Onset  . Stroke Father        Mother history unknown - never met her  . Venous thrombosis Sister   . Colon cancer Neg Hx     Social History Social History   Tobacco Use  . Smoking status: Former Smoker    Packs/day: 1.00    Years: 35.00    Pack years: 35.00    Types: Cigarettes    Last attempt to quit: 08/01/1992    Years since quitting: 26.0  . Smokeless tobacco: Former [employer] developer   Quit date: 1994  Substance Use Topics  . Alcohol use: No    Alcohol/week: 0.0 standard drinks  . Drug use: No     Allergies   Codeine; Dilaudid  [hydromorphone hcl]; Flomax [tamsulosin hcl]; Morphine and related; Sulfa antibiotics; Beta adrenergic blockers; and Carvedilol   Review of Systems Review of Systems  Unable to perform ROS: Other  Respiratory: Positive for cough and shortness of breath.   Cardiovascular: Positive  for leg swelling.       Chronic leg edema   Unable to obtain full review of systems.  Memory impairment  Physical Exam Updated Vital Signs BP 111/71   Pulse 88   Temp 97.7 F (36.5 C) (Oral)   Resp 20   Wt 72.6 kg   SpO2 98%   BMI 24.33 kg/m   Physical Exam Vitals signs and nursing Ray reviewed.  Constitutional:      General: He is not in acute distress.    Appearance: Normal appearance. He is well-developed.  HENT:     Head: Normocephalic and atraumatic.  Eyes:     Conjunctiva/sclera: Conjunctivae normal.     Pupils: Pupils are equal, round, and reactive to light.  Neck:     Musculoskeletal: Neck supple.     Thyroid: No thyromegaly.     Trachea: No tracheal deviation.  Cardiovascular:     Rate and Rhythm: Normal rate and regular rhythm.     Heart sounds: No murmur.  Pulmonary:     Effort: Pulmonary effort is normal.     Breath sounds: Rales present.     Comments:  NoRespiratory distress.  Rales at bases bilaterally Abdominal:     General: Bowel sounds are normal. There is no distension.     Palpations: Abdomen is soft.     Tenderness: There is no abdominal tenderness.  Musculoskeletal: Normal range of motion.        General: No tenderness.     Right lower leg: Edema present.     Left lower leg: Edema present.     Comments: Trace pitting pretibial and pedal edema bilaterally  Skin:    General: Skin is warm and dry.     Capillary Refill: Capillary refill takes less than 2 seconds.     Findings: No rash.  Neurological:     Mental Status: He is alert.     Coordination: Coordination normal.    Results for orders placed or performed during the hospital encounter of 85/27/78  Basic  metabolic panel  Result Value Ref Range   Sodium 141 135 - 145 mmol/L   Potassium 4.1 3.5 - 5.1 mmol/L   Chloride 103 98 - 111 mmol/L   CO2 27 22 - 32 mmol/L   Glucose, Bld 130 (H) 70 - 99 mg/dL   BUN 39 (H) 8 - 23 mg/dL   Creatinine, Ser 1.81 (H) 0.61 - 1.24 mg/dL   Calcium 9.7 8.9 - 10.3 mg/dL   GFR calc non Af Amer 34 (L) >60 mL/min   GFR calc Af Amer 39 (L) >60 mL/min   Anion gap 11 5 - 15  CBC with Differential/Platelet  Result Value Ref Range   WBC 7.4 4.0 - 10.5 K/uL   RBC 5.09 4.22 - 5.81 MIL/uL   Hemoglobin 12.6 (L) 13.0 - 17.0 g/dL   HCT 43.7 39.0 - 52.0 %   MCV 85.9 80.0 - 100.0 fL   MCH 24.8 (L) 26.0 - 34.0 pg   MCHC 28.8 (L) 30.0 - 36.0 g/dL   RDW 15.9 (H) 11.5 - 15.5 %   Platelets 207 150 - 400 K/uL   nRBC 0.0 0.0 - 0.2 %   Neutrophils Relative % 60 %   Neutro Abs 4.5 1.7 - 7.7 K/uL   Lymphocytes Relative 25 %   Lymphs Abs 1.9 0.7 - 4.0 K/uL   Monocytes Relative 9 %   Monocytes Absolute 0.7 0.1 - 1.0 K/uL   Eosinophils Relative 5 %  Eosinophils Absolute 0.4 0.0 - 0.5 K/uL   Basophils Relative 1 %   Basophils Absolute 0.0 0.0 - 0.1 K/uL   Immature Granulocytes 0 %   Abs Immature Granulocytes 0.01 0.00 - 0.07 K/uL  Brain natriuretic peptide  Result Value Ref Range   B Natriuretic Peptide 1,253.8 (H) 0.0 - 100.0 pg/mL  I-stat troponin, ED  Result Value Ref Range   Troponin i, poc 0.03 0.00 - 0.08 ng/mL   Comment 3           Dg Chest 2 View  Result Date: 07/31/2018 CLINICAL DATA:  Shortness of breath. EXAM: CHEST - 2 VIEW COMPARISON:  06/21/2018.  10/14/2017.  CT 04/05/2018 FINDINGS: Cardiac pacer with lead tip over the right atrium and right ventricle. Cardiomegaly with normal pulmonary vascularity. Mild bibasilar atelectasis/infiltrates. Small bilateral pleural effusions. No pneumothorax. IMPRESSION: 1. Cardiac pacer with lead tip over the right atrium and right ventricle. 2. Mild bibasilar atelectasis/infiltrates with small bilateral pleural effusions.  Electronically Signed   By: Marcello Moores  Register   On: 07/31/2018 09:36    ED Treatments / Results  Labs (all labs ordered are listed, but only abnormal results are displayed) Labs Reviewed  BASIC METABOLIC PANEL  CBC WITH DIFFERENTIAL/PLATELET  BRAIN NATRIURETIC PEPTIDE  I-STAT TROPONIN, ED    EKG EKG Interpretation  Date/Time:  Tuesday July 31 2018 08:33:08 EST Ventricular Rate:  89 PR Interval:    QRS Duration: 174 QT Interval:  408 QTC Calculation: 497 R Axis:   -69 Text Interpretation:  Sinus rhythm Ventricular premature complex Consider left atrial enlargement Nonspecific IVCD with LAD LVH with secondary repolarization abnormality Anterior Q waves, possibly due to LVH No significant change since last tracing Confirmed by Orlie Dakin [phone]) on 07/31/2018 8:47:21 AM   Radiology No results found.  Procedures Procedures (including critical care time)  Medications Ordered in ED Medications - No data to display Chest x-ray viewed by me.  Feel more likely congestive heart failure than pneumonia in light of no fever no leukocytosis.  Initial Impression / Assessment and Plan / ED Course  I have reviewed the triage vital signs and the nursing notes.  Pertinent labs & imaging results that were available during my care of the patient were reviewed by me and considered in my medical decision making (see chart for details).    Lab work consistent with chronically elevated BNP was slightly higher today and renal insufficiency which is chronic  11 AM patient was able to ambulate in the ED without dyspnea or desaturation of pulse oximetry.  Dyspnea is felt to be multifactorial secondary to COPD and mild exacerbation of CHF.  Plan to albuterol nebulizer every 4 hours.  Return if needed more than every 4 hours.  We will not adjust Lasix dosage presently as blood pressure is marginal with systolic 468.  Keep scheduled appointments with pulmonologist and cardiologist January 2019  return precautions given Final Clinical Impressions(s) / ED Diagnoses  Diagnoses #1 dyspnea #2 acute on chronic congestive heart failure #3 COPD #2 chronic renal insufficiency Final diagnoses:  None    ED Discharge Orders    None       Orlie Dakin, MD 07/31/18 1133

## 2018-07-31 NOTE — ED Notes (Signed)
Pt discharged from ED; instructions provided; Pt encouraged to return to ED if symptoms worsen and to f/u with PCP; Pt verbalized understanding of all instructions 

## 2018-07-31 NOTE — ED Notes (Signed)
Pt usually takes 20 mg lasix daily- states today he took 40 mg lasix today due to slight gain of 1 lb.

## 2018-07-31 NOTE — ED Notes (Signed)
Ambulated pt. Pt stated he felt a little out of breath while walking. O2 sat: 96-100% during ambulation. HR: 80-86.

## 2018-07-31 NOTE — Discharge Instructions (Signed)
Antonio Ray can use his albuterol nebulizer every 4 hours as needed for shortness of breath.  Return if needed more than every 4 hours, or if concern for any reason.  Keep scheduled appointments with his pulmonologist and cardiologist January 2020

## 2018-07-31 NOTE — Telephone Encounter (Signed)
I reviewed his information from the ED. Indeed his BNP is quite high but unchanged from 3 months ago. Not really sure what this means since BNP will be elevated with his CKD. I would have him take 40 mg lasix for 3 days then resume 20 mg daily.  Natsumi Whitsitt Martinique MD, [facility]

## 2018-07-31 NOTE — ED Triage Notes (Signed)
Pt coming from home, pt woke up SOB. Pt took albuterol at home. Pt uses PRN o2 and put self on 2L. EMS states lungs clear. Family wanted pt to be transported. 124/83, HR 90, 96%, CBG 156.

## 2018-07-31 NOTE — Telephone Encounter (Signed)
Spoke to patient's son [name] Dr.Jordan's recommendation given.Advised to keep appointment as planned with Dr.Jordan and call sooner if needed.

## 2018-08-02 NOTE — Telephone Encounter (Signed)
Patient's son is returning Brittany's call.  He states patient has an appt already scheduled for 08/08/2018 and wants to know if he should be seen sooner than this.  [facility] does not have an opening earlier.  Zenia Resides (son) can be reached at [phone].

## 2018-08-03 ENCOUNTER — Encounter: Payer: Self-pay | Admitting: Nurse Practitioner

## 2018-08-03 ENCOUNTER — Ambulatory Visit (INDEPENDENT_AMBULATORY_CARE_PROVIDER_SITE_OTHER): Payer: Medicare Other | Admitting: Nurse Practitioner

## 2018-08-03 VITALS — BP 96/60 | HR 117 | Ht 68.0 in | Wt 165.6 lb

## 2018-08-03 DIAGNOSIS — I5023 Acute on chronic systolic (congestive) heart failure: Secondary | ICD-10-CM | POA: Diagnosis not present

## 2018-08-03 DIAGNOSIS — J441 Chronic obstructive pulmonary disease with (acute) exacerbation: Secondary | ICD-10-CM | POA: Diagnosis not present

## 2018-08-03 MED ORDER — AMOXICILLIN-POT CLAVULANATE 875-125 MG PO TABS: 1.0000 | ORAL_TABLET | Freq: Two times a day (BID) | ORAL | 0 refills | Status: AC

## 2018-08-03 MED ORDER — PREDNISONE 10 MG PO TABS: ORAL_TABLET | ORAL | 0 refills | Status: AC

## 2018-08-03 NOTE — Patient Instructions (Addendum)
Will order Augmentin Prednisone taper ordered Continue trelegy Continue mucinex Please follow up with cardiology Follow up with Dr. [place] Bells at his first available appointment or sooner if needed

## 2018-08-03 NOTE — Progress Notes (Signed)
@Patient  ID: Antonio Ray, male    DOB: [birth year], 85 y.o.   MRN: [id number]  Chief Complaint  Patient presents with  . Hospitalization Follow-up    cough with green congestion    Referring provider: [name], MD  HPI 85 year old male former smoker with severe COPD complicated by recurrent aspiration pneumonia due to achalasia. S/P Botox injections in the distal esophagus late 2016.  Patient is followed by Dr. Lake Bells. PMH significant for CHF, AICD, CKD.  Tests:  CXR 07/31/18>> Cardiac pacer with lead tip over the right atrium and right ventricle. Mild bibasilar atelectasis/infiltrates with small bilateral pleural effusions.  09/2017 EchoEF 20 to 65%, grade 2 diastolic dysfunction  Endo 01/03/18 >severely dilated lumen of the esophagus, moderate amount of fluid in the entire esophagus status post dilatation 30 mm pneumatic balloon.  CT chest January 01, 2018 right-sided pneumonia, diffuse distention of the esophagus  Endoscopy with dilatation January 03, 2018  AFB cx + MAI 03/2018  OV 08/03/18 - ED follow up - 07/31/18 Patient presents for an ED follow-up.  He was taken to the ED by EMS on 07/31/2018 with shortness of breath.  Dyspnea was felt to be multifactorial secondary to COPD and mild exacerbation of CHF.  Patient states that he does feel some improved since ED discharge.  Patient has been taking increased dose of Lasix over the past few days.  States that he was told to do so by his cardiologist.  He weighs daily and states that his weight is stable.  He states that since discharge from the hospital he has been having increased chest congestion with productive cough.  He is compliant with Trelegy.  He is taking Mucinex.  He denies any fever, chest pain, or edema.     Allergies  Allergen Reactions  . Codeine Other (See Comments)    Gets very angry, disoriented  . Dilaudid [Hydromorphone Hcl] Other (See Comments)    VERY AGITATED, HOSTILE  . Flomax [Tamsulosin Hcl]  Shortness Of Breath  . Morphine And Related Other (See Comments)    VERY AGITATED, HOSTILE  . Sulfa Antibiotics Shortness Of Breath  . Beta Adrenergic Blockers Other (See Comments)    Disorientation  . Carvedilol Other (See Comments)    DISORIENTATION    Immunization History  Administered Date(s) Administered  . Influenza Split 05/09/2013, 06/16/2015  . Influenza, High Dose Seasonal PF 06/04/2014, 07/02/2015, 05/17/2017, 04/16/2018  . Influenza, Seasonal, Injecte, Preservative Fre 04/11/2016  . Influenza,inj,Quad PF,6+ Mos 04/11/2016  . Pneumococcal Conjugate-13 12/12/2014  . Pneumococcal Polysaccharide-23 12/22/2008  . Pneumococcal-Unspecified 12/22/2008  . Tdap 12/22/2008, 03/20/2013    Past Medical History:  Diagnosis Date  . Achalasia   . AICD (automatic cardioverter/defibrillator) present 03/30/2011   Analyze ST study patient  . BPH (benign prostatic hyperplasia)   . Chronic systolic dysfunction of left ventricle    EF 30-35%, CLASS II - III SYMPTOMS; intolerant to Coreg  . CKD (chronic kidney disease) stage 3, GFR 30-59 ml/min ([name]) 06/08/2012  . COPD (chronic obstructive pulmonary disease) ([place])   . Coronary artery disease    History of remote anterior MI with PCI to LAD in 2006; most recent cath 2007, no intervention required  . DOE (dyspnea on exertion)    with heavy exertion  . Dyspnea   . Dysrhythmia   . Esophageal dysmotility   . GERD (gastroesophageal reflux disease)   . Head injury, closed, with concussion 2000ish  . Heart murmur    hx  .  HOH (hard of hearing)   . Hyperlipidemia   . Memory loss    improved  . Pneumonia "several times"   aspiration pna with at least 3 admits for this 2016.   Marland Kitchen PVC's (premature ventricular contractions)   . Renal failure   . Skin cancer "several"   "forearms; head"  . Type II diabetes mellitus ([name])     Tobacco History: Social History   Tobacco Use  Smoking Status Former Smoker  . Packs/day: 1.00  . Years: 35.00   . Pack years: 35.00  . Types: Cigarettes  . Last attempt to quit: 08/01/1992  . Years since quitting: 26.0  Smokeless Tobacco Former Systems developer  . Quit date: 1994   Counseling given: Yes   Outpatient Encounter Medications as of 08/03/2018  Medication Sig  . albuterol (PROVENTIL HFA;VENTOLIN HFA) 108 (90 Base) MCG/ACT inhaler Inhale 2 puffs into the lungs every 6 (six) hours as needed for wheezing or shortness of breath.  Marland Kitchen albuterol (PROVENTIL) (2.5 MG/3ML) 0.083% nebulizer solution Take 3 mLs (2.5 mg total) by nebulization every 4 (four) hours as needed for wheezing or shortness of breath. J44.9  . aspirin 81 MG EC tablet Take 81 mg by mouth daily.    Marland Kitchen azithromycin (ZITHROMAX) 250 MG tablet Take 2 pills today then one a day for 4 additional days  . Cholecalciferol (VITAMIN D) 2000 units tablet Take 2,000 Units by mouth daily.   Marland Kitchen donepezil (ARICEPT) 10 MG tablet Take 1 tablet (10 mg total) by mouth daily.  . fenofibrate (TRICOR) 145 MG tablet Take 145 mg by mouth once a day (Patient taking differently: Take 145 mg by mouth daily. )  . finasteride (PROSCAR) 5 MG tablet Take 5 mg by mouth daily.  . Fluticasone-Umeclidin-Vilant (TRELEGY ELLIPTA) 100-62.5-25 MCG/INH AEPB Inhale 1 puff into the lungs daily.  . furosemide (LASIX) 20 MG tablet Take 1 tablet (20 mg total) by mouth daily. Take an additional 20mg  if your weight is up 3lbs in a day 5lbs in a week  . glucose blood (FREESTYLE TEST STRIPS) test strip Use as instructed 3x a day  . guaiFENesin (MUCINEX) 600 MG 12 hr tablet Take 600 mg by mouth as needed.  . insulin aspart (NOVOLOG) 100 UNIT/ML injection Inject 2-8 Units into the skin 3 (three) times daily before meals. - pens please  . Insulin Pen Needle 32G X 4 MM MISC Use 1x a day  . loratadine (CLARITIN) 10 MG tablet Take 1 tablet (10 mg total) by mouth daily.  . memantine (NAMENDA) 10 MG tablet Take 1 tablet (10 mg total) by mouth 2 (two) times daily.  . Multiple Vitamins-Minerals  (PRESERVISION AREDS 2) CAPS Take 1 capsule by mouth 2 (two) times daily.  . nitroGLYCERIN (NITRODUR - DOSED IN MG/24 HR) 0.2 mg/hr patch Place 1 patch (0.2 mg total) onto the skin daily. Alternates patch placement with odd/even days.  . pantoprazole (PROTONIX) 40 MG tablet Take 1 tablet (40 mg total) by mouth daily.  . pravastatin (PRAVACHOL) 40 MG tablet Take 1 tablet (40 mg total) by mouth daily. (Patient taking differently: Take 40 mg by mouth at bedtime. )  . amoxicillin-clavulanate (AUGMENTIN) 875-125 MG tablet Take 1 tablet by mouth 2 (two) times daily.  . predniSONE (DELTASONE) 10 MG tablet Take 3 tabs for 2 days, then 2 tabs for 2 days, then 1 tab for 2 days, then stop   No facility-administered encounter medications on file as of 08/03/2018.      Review of  Systems  Review of Systems  Constitutional: Negative.  Negative for chills and fever.  HENT: Negative.  Negative for congestion.   Respiratory: Positive for cough. Negative for shortness of breath and wheezing.   Cardiovascular: Negative.  Negative for chest pain, palpitations and leg swelling.  Gastrointestinal: Negative.   Allergic/Immunologic: Negative.   Neurological: Negative.   Psychiatric/Behavioral: Negative.        Physical Exam  BP 96/60 (BP Location: Right Arm, Patient Position: Sitting, Cuff Size: Normal)   Pulse (!) 117   Ht 5\' 8"  (1.727 m)   Wt 165 lb 9.6 oz (75.1 kg)   SpO2 97%   BMI 25.18 kg/m   Wt Readings from Last 5 Encounters:  08/03/18 165 lb 9.6 oz (75.1 kg)  07/31/18 160 lb (72.6 kg)  06/21/18 167 lb 3.2 oz (75.8 kg)  06/11/18 171 lb (77.6 kg)  06/08/18 167 lb 6.4 oz (75.9 kg)     Physical Exam Vitals signs and nursing note reviewed.  Constitutional:      General: He is not in acute distress.    Appearance: He is well-developed.  Cardiovascular:     Rate and Rhythm: Normal rate and regular rhythm.  Pulmonary:     Effort: Pulmonary effort is normal. No respiratory distress.      Breath sounds: Normal breath sounds. No wheezing or rhonchi.  Musculoskeletal:        General: No swelling.  Skin:    General: Skin is warm and dry.  Neurological:     Mental Status: He is alert and oriented to person, place, and time.       Imaging: Dg Chest 2 View  Result Date: 07/31/2018 CLINICAL DATA:  Shortness of breath. EXAM: CHEST - 2 VIEW COMPARISON:  06/21/2018.  10/14/2017.  CT 04/05/2018 FINDINGS: Cardiac pacer with lead tip over the right atrium and right ventricle. Cardiomegaly with normal pulmonary vascularity. Mild bibasilar atelectasis/infiltrates. Small bilateral pleural effusions. No pneumothorax. IMPRESSION: 1. Cardiac pacer with lead tip over the right atrium and right ventricle. 2. Mild bibasilar atelectasis/infiltrates with small bilateral pleural effusions. Electronically Signed   By: Marcello Moores  Register   On: 07/31/2018 09:36     Assessment & Plan:   Acute on chronic systolic congestive heart failure ([place]) Follow up with cardiology BP was low today, but patient will be stopping increased dose of lasix today Weight is stable - weighs daily  COPD with acute exacerbation ([place]) COPD exacerbation slow to resolve. Patient has improved since hospital discharge. Considering symptoms will give antibiotic and prednisone.  Patient Instructions  Will order Augmentin Prednisone taper ordered Continue trelegy Continue mucinex Please follow up with cardiology Follow up with Dr. Lake Bells at his first available appointment or sooner if needed       Fenton Foy, NP 08/06/2018

## 2018-08-06 ENCOUNTER — Encounter: Payer: Self-pay | Admitting: Nurse Practitioner

## 2018-08-06 NOTE — Assessment & Plan Note (Signed)
Follow up with cardiology BP was low today, but patient will be stopping increased dose of lasix today Weight is stable - weighs daily

## 2018-08-06 NOTE — Assessment & Plan Note (Signed)
COPD exacerbation slow to resolve. Patient has improved since hospital discharge. Considering symptoms will give antibiotic and prednisone.  Patient Instructions  Will order Augmentin Prednisone taper ordered Continue trelegy Continue mucinex Please follow up with cardiology Follow up with Dr. Lake Bells at his first available appointment or sooner if needed

## 2018-08-07 NOTE — Progress Notes (Signed)
Reviewed, agree 

## 2018-08-08 ENCOUNTER — Ambulatory Visit: Payer: Medicare Other | Admitting: Pulmonary Disease

## 2018-08-13 NOTE — Progress Notes (Signed)
Antonio Ray Date of Birth: Jan 04, 1934  Medical Record #[id number]  History of Present Illness: Antonio Ray is seen for follow up CAD and CHF.  He is seen with his son. He has a history of coronary disease with remote myocardial infarction in 1994. He had emergent angioplasty of the LAD. Cardiac catheterization 2007 showed nonobstructive disease. He has an ischemic cardiomyopathy with ejection fraction of 30-35%. His last Myoview in 2011 showed an anterior wall scar without ischemia. He has an ICD in place.  Echo in September 2016 showed EF had dropped to 10-15% with global hypokinesis and akinesis of the anteroseptum.  He also has a history of recurrent aspiration PNA.   He had a barium swallow that showed achalasia. Apparently manography confirmed this. He has had esophageal dilation. He has a history of memory loss and is now on Aricept and Namenda. He was admitted 9/21-9/22/17 with acute COPD exacerbation treated with Nebs, steroids, and antibiotics. Echo showed EF 20-25%. No acute CHF. He was admitted in February 2018 with COPD  exacerbation and April 2018 with recurrent aspiration pneumonia. Followed by pulmonary and GI. Son reports swallowing evaluation was OK.  He was admitted in July 2018 with altered mental status and UTI. CT head was negative. Symptoms resolved with antibiotics. In November he was admitted with fever, altered mental status and atypical PNA. Last ICD check in October 2018 was normal.   Admitted 09/05-09/04/2018 for ?asp PNA, more likely CHF. BNP level over 1500. Normal WBC. DC on Lasix 40 mg qd. Wt decreased to 165 after d/c. Lasix decreased to 20 mg qd.   Last ICD check November 2019 was normal.   Seen in the ED 07/31/18 with increased SOB and some cough. CXR showed small bilateral effusions. BNP elevated. He increased lasix for several days with improvement in breathing.   On follow up today he is seen with his son. Weight at home has been stable at 160-161. Still  has a cough sometimes productive. He has completed a course of antibiotics and steroids. Breathing has improved. He notes on 2-3 occasions a "shock" like sensation in the chest. This has resolved.   Current Outpatient Medications on File Prior to Visit  Medication Sig Dispense Refill  . albuterol (PROVENTIL HFA;VENTOLIN HFA) 108 (90 Base) MCG/ACT inhaler Inhale 2 puffs into the lungs every 6 (six) hours as needed for wheezing or shortness of breath. 1 Inhaler 2  . albuterol (PROVENTIL) (2.5 MG/3ML) 0.083% nebulizer solution Take 3 mLs (2.5 mg total) by nebulization every 4 (four) hours as needed for wheezing or shortness of breath. J44.9 75 mL 12  . amoxicillin-clavulanate (AUGMENTIN) 875-125 MG tablet Take 1 tablet by mouth 2 (two) times daily. 14 tablet 0  . aspirin 81 MG EC tablet Take 81 mg by mouth daily.      Marland Kitchen azithromycin (ZITHROMAX) 250 MG tablet Take 2 pills today then one a day for 4 additional days 6 tablet 0  . Cholecalciferol (VITAMIN D) 2000 units tablet Take 2,000 Units by mouth daily.     Marland Kitchen donepezil (ARICEPT) 10 MG tablet Take 1 tablet (10 mg total) by mouth daily. 90 tablet 1  . fenofibrate (TRICOR) 145 MG tablet Take 145 mg by mouth once a day (Patient taking differently: Take 145 mg by mouth daily. ) 90 tablet 1  . finasteride (PROSCAR) 5 MG tablet Take 5 mg by mouth daily.  0  . Fluticasone-Umeclidin-Vilant (TRELEGY ELLIPTA) 100-62.5-25 MCG/INH AEPB Inhale 1 puff into the lungs  daily. 1 each 0  . furosemide (LASIX) 20 MG tablet Take 1 tablet (20 mg total) by mouth daily. Take an additional 19m if your weight is up 3lbs in a day 5lbs in a week 35 tablet 0  . glucose blood (FREESTYLE TEST STRIPS) test strip Use as instructed 3x a day 200 each 3  . guaiFENesin (MUCINEX) 600 MG 12 hr tablet Take 600 mg by mouth as needed.    . insulin aspart (NOVOLOG) 100 UNIT/ML injection Inject 2-8 Units into the skin 3 (three) times daily before meals. - pens please 15 mL 5  . Insulin Pen  Needle 32G X 4 MM MISC Use 1x a day 200 each 3  . loratadine (CLARITIN) 10 MG tablet Take 1 tablet (10 mg total) by mouth daily. 30 tablet 1  . memantine (NAMENDA) 10 MG tablet Take 1 tablet (10 mg total) by mouth 2 (two) times daily. 180 tablet 1  . Multiple Vitamins-Minerals (PRESERVISION AREDS 2) CAPS Take 1 capsule by mouth 2 (two) times daily.    . nitroGLYCERIN (NITRODUR - DOSED IN MG/24 HR) 0.2 mg/hr patch Place 1 patch (0.2 mg total) onto the skin daily. Alternates patch placement with odd/even days. 30 patch 6  . pantoprazole (PROTONIX) 40 MG tablet Take 1 tablet (40 mg total) by mouth daily.    . pravastatin (PRAVACHOL) 40 MG tablet Take 1 tablet (40 mg total) by mouth daily. (Patient taking differently: Take 40 mg by mouth at bedtime. ) 90 tablet 2  . predniSONE (DELTASONE) 10 MG tablet Take 3 tabs for 2 days, then 2 tabs for 2 days, then 1 tab for 2 days, then stop 12 tablet 0   No current facility-administered medications on file prior to visit.     Allergies  Allergen Reactions  . Codeine Other (See Comments)    Gets very angry, disoriented  . Dilaudid [Hydromorphone Hcl] Other (See Comments)    VERY AGITATED, HOSTILE  . Flomax [Tamsulosin Hcl] Shortness Of Breath  . Morphine And Related Other (See Comments)    VERY AGITATED, HOSTILE  . Sulfa Antibiotics Shortness Of Breath  . Beta Adrenergic Blockers Other (See Comments)    Disorientation  . Carvedilol Other (See Comments)    DISORIENTATION    Past Medical History:  Diagnosis Date  . Achalasia   . AICD (automatic cardioverter/defibrillator) present 03/30/2011   Analyze ST study patient  . BPH (benign prostatic hyperplasia)   . Chronic systolic dysfunction of left ventricle    EF 30-35%, CLASS II - III SYMPTOMS; intolerant to Coreg  . CKD (chronic kidney disease) stage 3, GFR 30-59 ml/min ([name]) 06/08/2012  . COPD (chronic obstructive pulmonary disease) ([place]   . Coronary artery disease    History of remote anterior  MI with PCI to LAD in 2006; most recent cath 2007, no intervention required  . DOE (dyspnea on exertion)    with heavy exertion  . Dyspnea   . Dysrhythmia   . Esophageal dysmotility   . GERD (gastroesophageal reflux disease)   . Head injury, closed, with concussion 2000ish  . Heart murmur    hx  . HOH (hard of hearing)   . Hyperlipidemia   . Memory loss    improved  . Pneumonia "several times"   aspiration pna with at least 3 admits for this 2016.   .Marland KitchenPVC's (premature ventricular contractions)   . Renal failure   . Skin cancer "several"   "forearms; head"  . Type II diabetes  [facility])     Past Surgical History:  Procedure Laterality Date  . BALLOON DILATION N/A 09/09/2015   Procedure: BALLOON DILATION;  Surgeon: Mauri Pole, MD;  Location: [facility];  Service: Endoscopy;  Laterality: N/A;  rigiflex achalasia balloon dilators  . BOTOX INJECTION N/A 04/08/2015   Procedure: BOTOX INJECTION;  Surgeon: Milus Banister, MD;  Location: [facility];  Service: Endoscopy;  Laterality: N/A;  . CARDIAC CATHETERIZATION  01/11/2006   DEMONSTRATES AKINESIA OF THE DISTAL ANTERIOR WALL, DISTAL INFERIOR WALL AND AKINESIA OF THE APEX. THE BASAL SEGMENTS CONTRACT WELL AND OVERALL EF 35%  . CATARACT EXTRACTION W/ INTRAOCULAR LENS  IMPLANT, BILATERAL  08/2014-09/2014  . CHOLECYSTECTOMY  2/11  . CORONARY ANGIOPLASTY  1994   TO THE LAD  . ESOPHAGEAL MANOMETRY N/A 03/16/2015   Procedure: ESOPHAGEAL MANOMETRY (EM);  Surgeon: Jerene Bears, MD;  Location: [facility];  Service: Gastroenterology;  Laterality: N/A;  . ESOPHAGOGASTRODUODENOSCOPY N/A 04/08/2015   Procedure: ESOPHAGOGASTRODUODENOSCOPY (EGD);  Surgeon: Milus Banister, MD;  Location: [facility];  Service: Endoscopy;  Laterality: N/A;  . ESOPHAGOGASTRODUODENOSCOPY N/A 01/03/2018   Procedure: ESOPHAGOGASTRODUODENOSCOPY (EGD);  Surgeon: Mauri Pole, MD;  Location: [facility];  Service: Endoscopy;  Laterality: N/A;  .  ESOPHAGOGASTRODUODENOSCOPY (EGD) WITH PROPOFOL N/A 09/09/2015   Procedure: ESOPHAGOGASTRODUODENOSCOPY (EGD) WITH PROPOFOL;  Surgeon: Mauri Pole, MD;  Location: [facility];  Service: Endoscopy;  Laterality: N/A;  pt. is to have gastrografin xray post recovery-do not discharge until results are back  . EYE SURGERY    . FOOT FRACTURE SURGERY Right 1980's  . INSERT / REPLACE / REMOVE PACEMAKER    . SAVORY DILATION N/A 01/03/2018   Procedure: RIGIFLEX DILATION;  Surgeon: Mauri Pole, MD;  Location: [facility];  Service: Endoscopy;  Laterality: N/A;  . SKIN CANCER EXCISION  "several"   "forearms, head"    Social History   Tobacco Use  Smoking Status Former Smoker  . Packs/day: 1.00  . Years: 35.00  . Pack years: 35.00  . Types: Cigarettes  . Last attempt to quit: 08/01/1992  . Years since quitting: 26.0  Smokeless Tobacco Former Systems developer  . Quit date: 64    Social History   Substance and Sexual Activity  Alcohol Use No  . Alcohol/week: 0.0 standard drinks    Family History  Problem Relation Age of Onset  . Stroke Father        Mother history unknown - never met her  . Venous thrombosis Sister   . Colon cancer Neg Hx     Review of Systems: As noted in history of present illness. All other systems were reviewed and are negative.  Physical Exam: BP (!) 98/58   Pulse 91   Ht 5' 8"  (1.727 m)   Wt 168 lb 6.4 oz (76.4 kg)   BMI 25.61 kg/m  GENERAL:  Well appearing elderly WM in NAD HEENT:  PERRL, EOMI, sclera are clear. Oropharynx is clear. NECK:  No jugular venous distention, carotid upstroke brisk and symmetric, no bruits, no thyromegaly or adenopathy LUNGS:  Decreased BS in base with some coarse BS. CHEST:  Unremarkable HEART:  RRR,  PMI not displaced or sustained,S1 and S2 within normal limits, no S3, no S4: no clicks, no rubs, no murmurs ABD:  Soft, nontender. BS +, no masses or bruits. No hepatomegaly, no splenomegaly EXT:  2 + pulses throughout, 2+  bilateral pretibial edema,  no cyanosis no clubbing SKIN:  Warm and dry.  No rashes  NEURO:  Alert and oriented x 3. Cranial nerves II through XII intact. PSYCH:  Cognitively intact    LABORATORY DATA: Lab Results  Component Value Date   WBC 7.4 07/31/2018   HGB 12.6 (L) 07/31/2018   HCT 43.7 07/31/2018   PLT 207 07/31/2018   GLUCOSE 130 (H) 07/31/2018   CHOL 136 06/20/2013   TRIG 142.0 06/20/2013   HDL 32.00 (L) 06/20/2013   LDLCALC 76 06/20/2013   ALT 18 04/27/2018   AST 21 04/27/2018   NA 141 07/31/2018   K 4.1 07/31/2018   CL 103 07/31/2018   CREATININE 1.81 (H) 07/31/2018   BUN 39 (H) 07/31/2018   CO2 27 07/31/2018   TSH 0.635 01/02/2018   INR 1.23 01/01/2018   HGBA1C 6.2 (A) 06/11/2018   Labs dated 02/05/16: cholesterol 144, triglycerides 223, HDL 40, LDL 59. Dated 11/01/17: cholesterol 126, triglycerides 94, HDL 50, LDL 57.   Echo: 04/22/16: Study Conclusions  - Left ventricle: The cavity size was normal. There was moderate   concentric hypertrophy. Systolic function was severely reduced.   The estimated ejection fraction was in the range of 20% to 25%.   Wall motion was normal; there were no regional wall motion   abnormalities. Doppler parameters are consistent with abnormal   left ventricular relaxation (grade 1 diastolic dysfunction).   Doppler parameters are consistent with elevated ventricular   end-diastolic filling pressure. - Aortic valve: There was trivial regurgitation. - Aortic root: The aortic root was normal in size. - Mitral valve: Structurally normal valve. There was mild   regurgitation. - Left atrium: The atrium was mildly dilated. - Right ventricle: Pacer wire or catheter noted in right ventricle.   Systolic function was normal. - Right atrium: Pacer wire or catheter noted in right atrium. - Tricuspid valve: There was trivial regurgitation. - Pulmonic valve: There was no regurgitation. - Pulmonary arteries: Systolic pressure was within the  normal   range. - Inferior vena cava: The vessel was normal in size. - Pericardium, extracardiac: There was no pericardial effusion.  Impressions:  - There is akinesis of the basal and mid anterior, anteroseptal,   inferoseptal walls, mid inferior wall and dyskinesis of the   apical anterior, septal and inferior walls including the true   septum. LVEF has slightly improved from the study on 04/02/2015,   now 20-25%, previously 10-15%.   Echo 10/15/17: Study Conclusions  - Left ventricle: The cavity size was moderately dilated. Wall   thickness was normal. Systolic function was severely reduced. The   estimated ejection fraction was in the range of 20% to 25%.   Akinesis of the anteroseptal, anterior, and apical myocardium;   consistent with infarction in the distribution of the left   anterior descending coronary artery. Akinesis and scarring of the   mid-apicalinferior and inferoseptal myocardium; consistent with   infarction in the distribution of the right coronary artery.   Features are consistent with a pseudonormal left ventricular   filling pattern, with concomitant abnormal relaxation and   increased filling pressure (grade 2 diastolic dysfunction).   Acoustic contrast opacification revealed no evidence ofthrombus. - Aortic valve: There was trivial regurgitation. - Mitral valve: There was mild to moderate regurgitation directed   centrally. - Left atrium: The atrium was moderately dilated. - Right atrium: The atrium was mildly dilated.  Assessment / Plan: 1. Coronary disease with remote anterior myocardial infarction in 1994. Cardiac catheterization 2007 showed nonobstructive disease. His last stress test in October 2011 showed no  ischemia. No significant angina now.  We will continue with his current medical therapy including aspirin. He has a history of intolerance to carvedilol  because of dizziness. On nitrodur patch.   2. Ischemic cardiomyopathy with chronic  systolic CHF. Last EF 20-25%. He is class 3. He has increased edema. I think he is still volume overloaded although weight at home has been stable.   We will increase lasix to 20 mg alternating with 40 mg daily.  Restrict salt intake.  Not a candidate for ACEi/ARB due to CKD.   3. Prophylactic ICD. Stable   4. Hyperlipidemia. Excellent control.  5. Chronic kidney disease stage III. Last creatinine 1.81.   6. Recurrent PNA- possibly related to aspiration. Followed by pulmonary  7. Achalasia. Per GI. S/p esophageal dilation.  8. COPD  Follow up in 3 months.

## 2018-08-14 ENCOUNTER — Other Ambulatory Visit: Payer: Medicare Other

## 2018-08-14 ENCOUNTER — Telehealth: Payer: Self-pay | Admitting: Pulmonary Disease

## 2018-08-14 DIAGNOSIS — J449 Chronic obstructive pulmonary disease, unspecified: Secondary | ICD-10-CM

## 2018-08-14 NOTE — Telephone Encounter (Signed)
  Patient's Son sent a email -  Hi. Dad's coughing up a little more phlegm but feels fine. I dropped a sample off today as well. We're continuing with neb treatments to keep things broken up.  Routed to [facility] NP as Juluis Rainier

## 2018-08-14 NOTE — Telephone Encounter (Addendum)
Received call from [facility] in the lab.  Patient's son brought sputum sample to the office in a styrofoam cup, with a plastic lid and a rubber band holding the lid down.  Per Donnetta Simpers, when she attempted to explain to son that we were unable to accept specimen in a non-sterile container, son became angry and rude.  Per Donnetta Simpers, she did provide patient's son with a sterile sputum cup to take with him.

## 2018-08-14 NOTE — Telephone Encounter (Signed)
Per Donnetta Simpers in the lab, patient himself brought sputum sample back to the lab in a sterile cup  Discussed with TP (APP of the afternoon) to advise on the sputum order Per TP: okay for sputum and AFB culture  Orders placed Nothing further needed; will sign off

## 2018-08-16 ENCOUNTER — Ambulatory Visit (INDEPENDENT_AMBULATORY_CARE_PROVIDER_SITE_OTHER): Payer: Medicare Other | Admitting: Cardiology

## 2018-08-16 ENCOUNTER — Encounter: Payer: Self-pay | Admitting: Cardiology

## 2018-08-16 VITALS — BP 98/58 | HR 91 | Ht 68.0 in | Wt 168.4 lb

## 2018-08-16 DIAGNOSIS — N183 Chronic kidney disease, stage 3 unspecified: Secondary | ICD-10-CM

## 2018-08-16 DIAGNOSIS — I2510 Atherosclerotic heart disease of native coronary artery without angina pectoris: Secondary | ICD-10-CM | POA: Diagnosis not present

## 2018-08-16 DIAGNOSIS — I255 Ischemic cardiomyopathy: Secondary | ICD-10-CM | POA: Diagnosis not present

## 2018-08-16 DIAGNOSIS — I5042 Chronic combined systolic (congestive) and diastolic (congestive) heart failure: Secondary | ICD-10-CM

## 2018-08-16 DIAGNOSIS — Z95810 Presence of automatic (implantable) cardiac defibrillator: Secondary | ICD-10-CM | POA: Diagnosis not present

## 2018-08-16 MED ORDER — FUROSEMIDE 20 MG PO TABS: 20.0000 mg | ORAL_TABLET | Freq: Every day | ORAL | 0 refills | Status: AC

## 2018-08-16 NOTE — Patient Instructions (Signed)
Increase lasix to 20 mg alternating with 40 mg daily  Continue your other therapy  Follow up in 3 months.

## 2018-08-20 NOTE — Telephone Encounter (Signed)
Tonya please advise on message below. He stated nothing other than occasional cough with sputum.

## 2018-08-27 ENCOUNTER — Ambulatory Visit (INDEPENDENT_AMBULATORY_CARE_PROVIDER_SITE_OTHER): Payer: Medicare Other

## 2018-08-27 DIAGNOSIS — I5042 Chronic combined systolic (congestive) and diastolic (congestive) heart failure: Secondary | ICD-10-CM | POA: Diagnosis not present

## 2018-08-27 DIAGNOSIS — I495 Sick sinus syndrome: Secondary | ICD-10-CM

## 2018-08-27 LAB — RESPIRATORY CULTURE OR RESPIRATORY AND SPUTUM CULTURE
MICRO NUMBER:: 53051
SPECIMEN QUALITY:: ADEQUATE

## 2018-08-27 LAB — MYCOBACTERIA,CULT W/FLUOROCHROME SMEAR
MICRO NUMBER:: 53050
SMEAR:: NONE SEEN
SPECIMEN QUALITY:: ADEQUATE

## 2018-08-28 LAB — CUP PACEART REMOTE DEVICE CHECK
Date Time Interrogation Session: 20200128210226
Implantable Lead Implant Date: 20120829
Implantable Lead Location: 753859
Implantable Lead Location: 753860
Implantable Pulse Generator Implant Date: 20120829
MDC IDC LEAD IMPLANT DT: 20120829
Pulse Gen Serial Number: 828604

## 2018-08-28 NOTE — Progress Notes (Signed)
Remote ICD transmission.   

## 2018-08-31 DIAGNOSIS — N183 Chronic kidney disease, stage 3 (moderate): Secondary | ICD-10-CM | POA: Diagnosis not present

## 2018-08-31 DIAGNOSIS — F0390 Unspecified dementia without behavioral disturbance: Secondary | ICD-10-CM | POA: Diagnosis not present

## 2018-08-31 DIAGNOSIS — I5022 Chronic systolic (congestive) heart failure: Secondary | ICD-10-CM | POA: Diagnosis not present

## 2018-08-31 DIAGNOSIS — J440 Chronic obstructive pulmonary disease with acute lower respiratory infection: Secondary | ICD-10-CM | POA: Diagnosis not present

## 2018-08-31 DIAGNOSIS — E1142 Type 2 diabetes mellitus with diabetic polyneuropathy: Secondary | ICD-10-CM | POA: Diagnosis not present

## 2018-09-03 DIAGNOSIS — Z794 Long term (current) use of insulin: Secondary | ICD-10-CM | POA: Diagnosis not present

## 2018-09-03 DIAGNOSIS — Z9981 Dependence on supplemental oxygen: Secondary | ICD-10-CM | POA: Diagnosis not present

## 2018-09-03 DIAGNOSIS — I5022 Chronic systolic (congestive) heart failure: Secondary | ICD-10-CM | POA: Diagnosis not present

## 2018-09-03 DIAGNOSIS — F0390 Unspecified dementia without behavioral disturbance: Secondary | ICD-10-CM | POA: Diagnosis not present

## 2018-09-03 DIAGNOSIS — E1142 Type 2 diabetes mellitus with diabetic polyneuropathy: Secondary | ICD-10-CM | POA: Diagnosis not present

## 2018-09-03 DIAGNOSIS — Z95810 Presence of automatic (implantable) cardiac defibrillator: Secondary | ICD-10-CM | POA: Diagnosis not present

## 2018-09-03 DIAGNOSIS — Z7982 Long term (current) use of aspirin: Secondary | ICD-10-CM | POA: Diagnosis not present

## 2018-09-05 ENCOUNTER — Encounter: Payer: Self-pay | Admitting: Internal Medicine

## 2018-09-05 DIAGNOSIS — R000 Tachycardia, unspecified: Secondary | ICD-10-CM | POA: Diagnosis not present

## 2018-09-05 DIAGNOSIS — R0689 Other abnormalities of breathing: Secondary | ICD-10-CM | POA: Diagnosis not present

## 2018-09-05 DIAGNOSIS — R404 Transient alteration of awareness: Secondary | ICD-10-CM | POA: Diagnosis not present

## 2018-09-05 DIAGNOSIS — I469 Cardiac arrest, cause unspecified: Secondary | ICD-10-CM | POA: Diagnosis not present

## 2018-09-06 DIAGNOSIS — R404 Transient alteration of awareness: Secondary | ICD-10-CM | POA: Diagnosis not present

## 2018-09-06 DIAGNOSIS — I469 Cardiac arrest, cause unspecified: Secondary | ICD-10-CM | POA: Diagnosis not present

## 2018-09-06 DIAGNOSIS — R000 Tachycardia, unspecified: Secondary | ICD-10-CM | POA: Diagnosis not present

## 2018-09-06 DIAGNOSIS — R0689 Other abnormalities of breathing: Secondary | ICD-10-CM | POA: Diagnosis not present

## 2018-09-06 NOTE — Telephone Encounter (Signed)
Noted  

## 2018-09-27 ENCOUNTER — Ambulatory Visit: Payer: Medicare Other | Admitting: Pulmonary Disease

## 2018-09-30 DIAGNOSIS — 419620001 Death: Secondary | SNOMED CT | POA: Diagnosis not present

## 2018-09-30 NOTE — Telephone Encounter (Signed)
Routing to BQ to make aware/

## 2018-09-30 DEATH — deceased

## 2018-10-21 ENCOUNTER — Encounter: Payer: Self-pay | Admitting: Internal Medicine

## 2018-10-22 ENCOUNTER — Telehealth: Payer: Self-pay | Admitting: Neurology

## 2018-10-22 NOTE — Telephone Encounter (Signed)
Patient passed away Sep 18, 2018

## 2018-10-25 ENCOUNTER — Ambulatory Visit: Payer: Federal, State, Local not specified - PPO | Admitting: Nurse Practitioner

## 2018-10-25 ENCOUNTER — Ambulatory Visit: Payer: Federal, State, Local not specified - PPO | Admitting: Neurology

## 2018-11-12 ENCOUNTER — Ambulatory Visit: Payer: Medicare Other | Admitting: Cardiology

## 2018-11-23 ENCOUNTER — Encounter: Payer: Self-pay | Admitting: Gastroenterology

## 2018-12-11 ENCOUNTER — Ambulatory Visit: Payer: Medicare Other | Admitting: Internal Medicine

## 2019-01-07 ENCOUNTER — Ambulatory Visit: Payer: Medicare Other | Admitting: Cardiology

## 2019-08-12 IMAGING — CR DG CHEST 2V
2 series · 2 of 2 positions shown · non-contrast
Comparison: CT 01/01/2018.  Chest x-ray 01/01/2018.

CLINICAL DATA: Cough.  Shortness of breath.

EXAM:
CHEST - 2 VIEW

[chest lat]
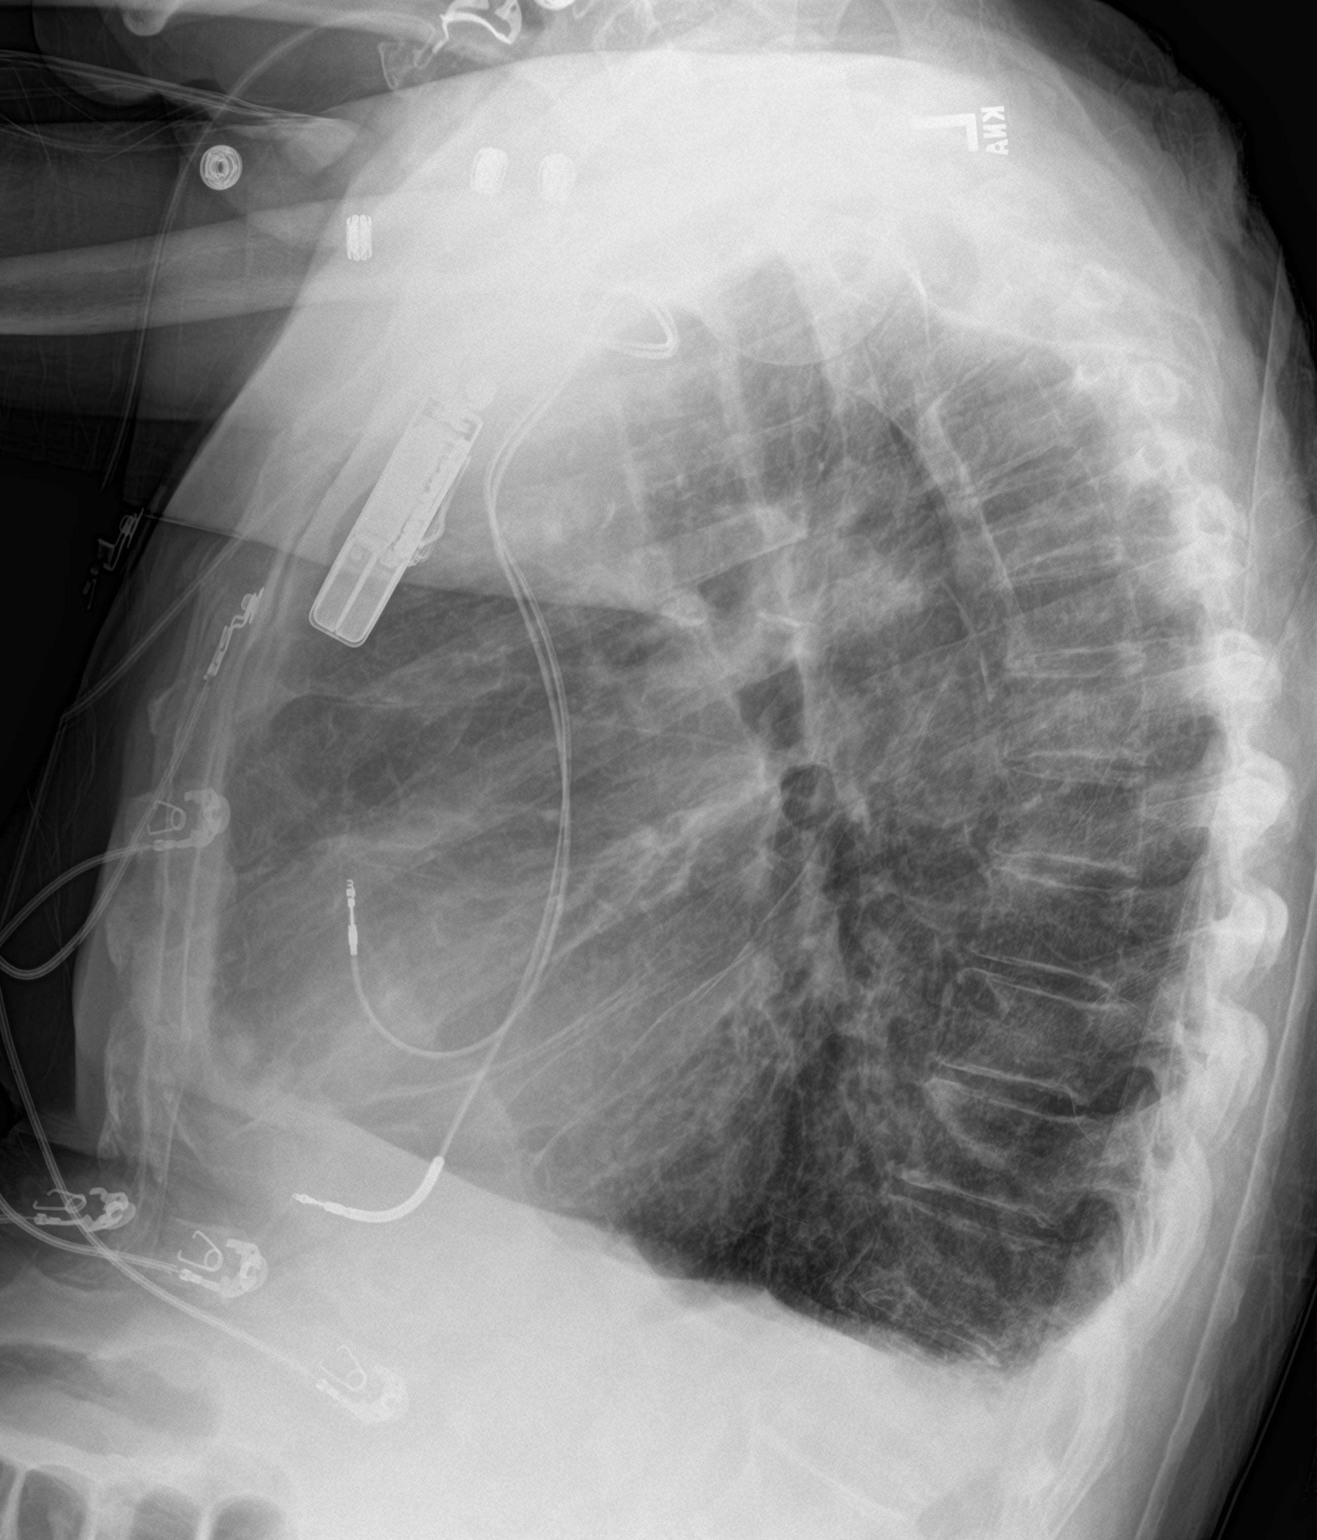

[chest ap]
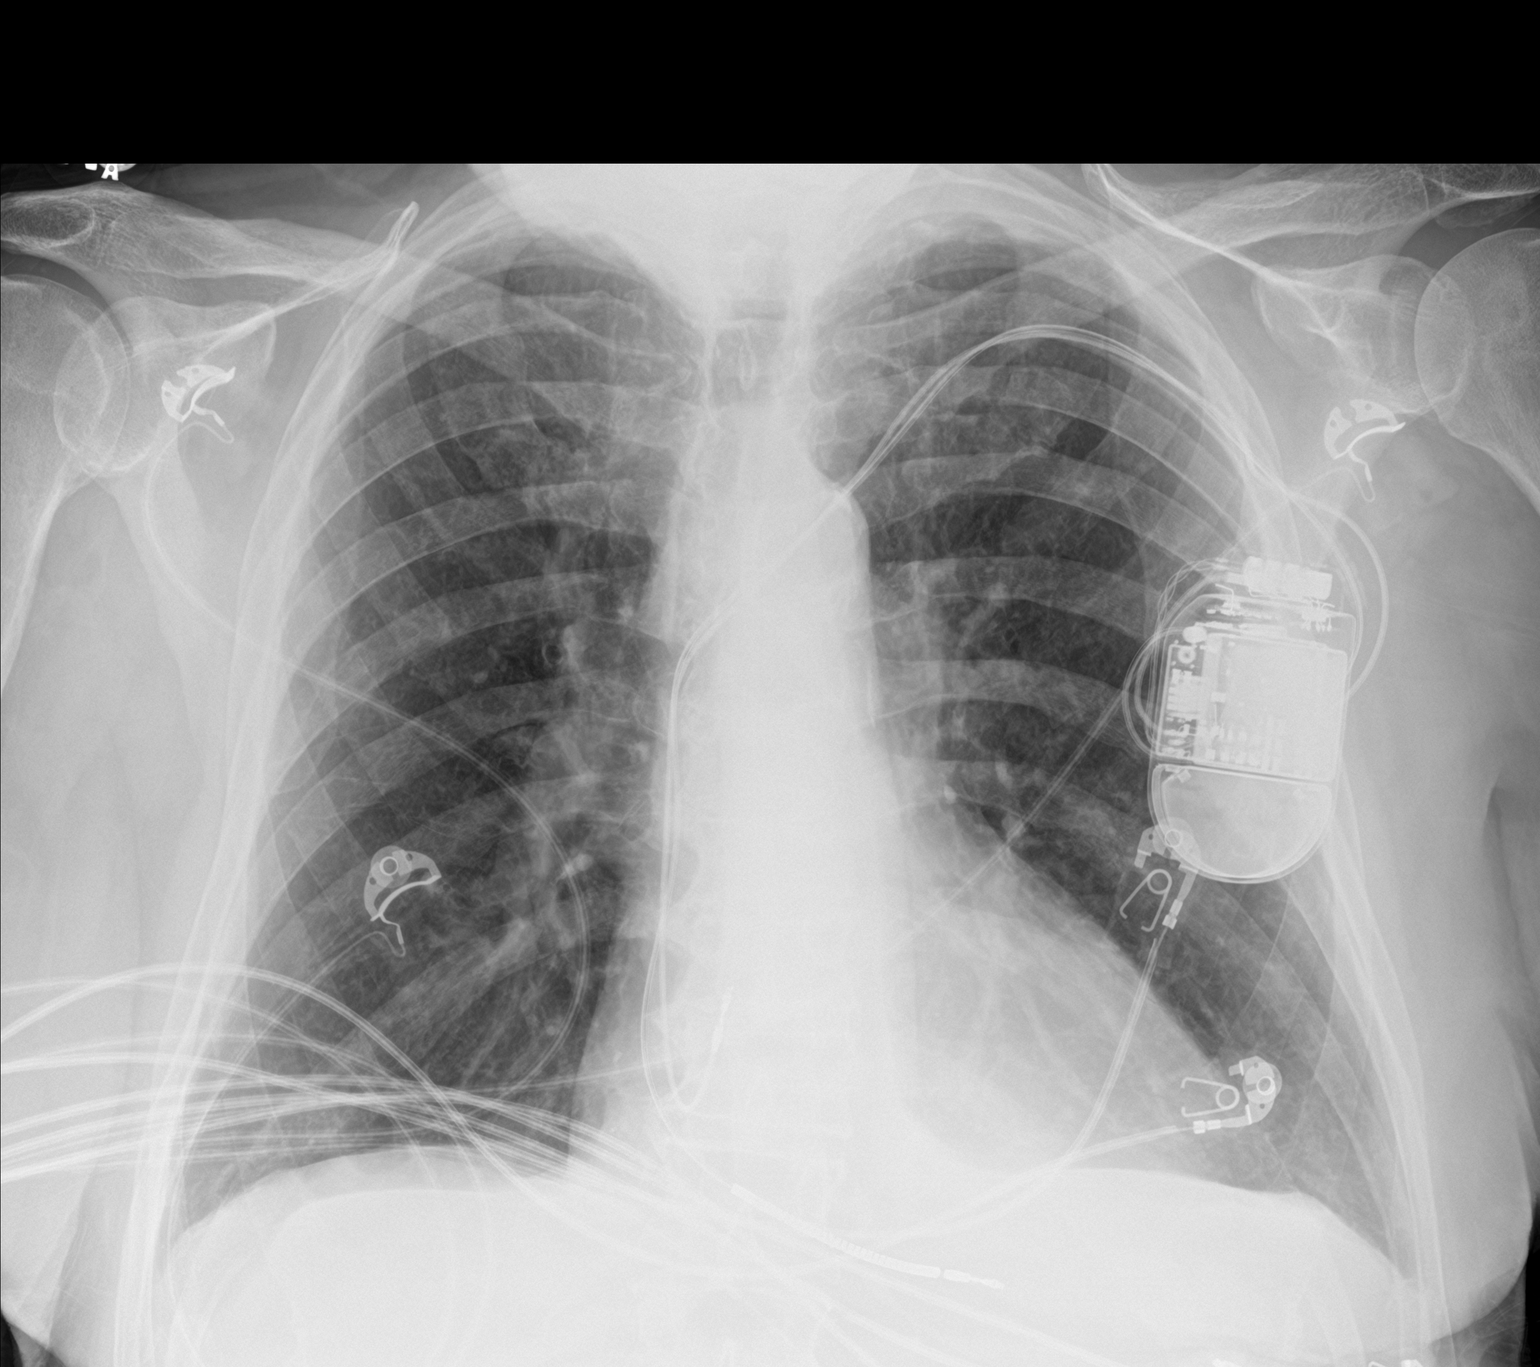

[2 of 2 positions shown; findings below may reference images not displayed]

FINDINGS: Cardiac pacer noted with lead tips over the right atrium right
ventricle. Cardiomegaly with normal pulmonary vascularity. No focal
infiltrate. Tiny bilateral pleural effusions. No pneumothorax. No
acute bony abnormality.
IMPRESSION: 1. Cardiac pacer with lead tip over the right atrium right
ventricle. Cardiomegaly. No pulmonary venous congestion.

2.  No focal infiltrate.  Tiny bilateral pleural effusions.
